# Patient Record
Sex: Male | Born: 1945 | Race: Black or African American | Hispanic: No | Smoking: Former smoker
Health system: Southern US, Community
[De-identification: ages and names within clinical notes are randomized; demographics above are authoritative.]

## PROBLEM LIST (undated history)

## (undated) ENCOUNTER — Ambulatory Visit: Payer: Self-pay

## (undated) ENCOUNTER — Emergency Department (HOSPITAL_COMMUNITY): Admission: EM | Discharge status: Absent without leave | Payer: Medicare Other

## (undated) DIAGNOSIS — I739 Peripheral vascular disease, unspecified: Secondary | ICD-10-CM

## (undated) DIAGNOSIS — H5462 Unqualified visual loss, left eye, normal vision right eye: Secondary | ICD-10-CM

## (undated) DIAGNOSIS — E785 Hyperlipidemia, unspecified: Secondary | ICD-10-CM

## (undated) DIAGNOSIS — W3400XA Accidental discharge from unspecified firearms or gun, initial encounter: Secondary | ICD-10-CM

## (undated) DIAGNOSIS — R809 Proteinuria, unspecified: Secondary | ICD-10-CM

## (undated) DIAGNOSIS — G629 Polyneuropathy, unspecified: Secondary | ICD-10-CM

## (undated) DIAGNOSIS — I639 Cerebral infarction, unspecified: Secondary | ICD-10-CM

## (undated) DIAGNOSIS — I10 Essential (primary) hypertension: Secondary | ICD-10-CM

## (undated) DIAGNOSIS — Z720 Tobacco use: Secondary | ICD-10-CM

## (undated) DIAGNOSIS — N183 Chronic kidney disease, stage 3 unspecified: Secondary | ICD-10-CM

## (undated) DIAGNOSIS — E119 Type 2 diabetes mellitus without complications: Secondary | ICD-10-CM

## (undated) HISTORY — PX: LAPAROTOMY: SHX154

## (undated) HISTORY — DX: Proteinuria, unspecified: R80.9

## (undated) HISTORY — DX: Polyneuropathy, unspecified: G62.9

## (undated) HISTORY — DX: Hyperlipidemia, unspecified: E78.5

## (undated) HISTORY — DX: Cerebral infarction, unspecified: I63.9

## (undated) HISTORY — DX: Essential (primary) hypertension: I10

---

## 1996-07-15 ENCOUNTER — Encounter: Payer: Self-pay | Admitting: Internal Medicine

## 1997-03-05 DIAGNOSIS — I639 Cerebral infarction, unspecified: Secondary | ICD-10-CM

## 1997-03-05 HISTORY — DX: Cerebral infarction, unspecified: I63.9

## 2004-01-25 ENCOUNTER — Ambulatory Visit: Payer: Self-pay | Admitting: Internal Medicine

## 2004-03-13 ENCOUNTER — Ambulatory Visit: Payer: Self-pay | Admitting: Internal Medicine

## 2004-05-22 ENCOUNTER — Ambulatory Visit: Payer: Self-pay | Admitting: Internal Medicine

## 2004-06-27 ENCOUNTER — Ambulatory Visit: Payer: Self-pay | Admitting: Internal Medicine

## 2004-06-29 ENCOUNTER — Ambulatory Visit: Payer: Self-pay | Admitting: Cardiology

## 2004-07-04 ENCOUNTER — Ambulatory Visit: Payer: Self-pay | Admitting: Internal Medicine

## 2004-07-05 ENCOUNTER — Encounter: Payer: Self-pay | Admitting: Internal Medicine

## 2004-08-23 ENCOUNTER — Ambulatory Visit: Payer: Self-pay | Admitting: Internal Medicine

## 2004-09-01 ENCOUNTER — Ambulatory Visit: Payer: Self-pay | Admitting: Internal Medicine

## 2004-11-18 ENCOUNTER — Emergency Department (HOSPITAL_COMMUNITY): Admission: EM | Admit: 2004-11-18 | Discharge: 2004-11-18 | Payer: Self-pay | Admitting: Emergency Medicine

## 2004-11-23 ENCOUNTER — Ambulatory Visit: Payer: Self-pay | Admitting: Internal Medicine

## 2005-04-10 ENCOUNTER — Ambulatory Visit: Payer: Self-pay | Admitting: Internal Medicine

## 2005-04-17 ENCOUNTER — Ambulatory Visit: Payer: Self-pay | Admitting: Internal Medicine

## 2005-05-30 ENCOUNTER — Emergency Department (HOSPITAL_COMMUNITY): Admission: EM | Admit: 2005-05-30 | Discharge: 2005-05-30 | Payer: Self-pay | Admitting: Emergency Medicine

## 2005-07-17 ENCOUNTER — Ambulatory Visit: Payer: Self-pay | Admitting: Internal Medicine

## 2005-12-14 ENCOUNTER — Encounter: Payer: Self-pay | Admitting: Internal Medicine

## 2005-12-14 ENCOUNTER — Ambulatory Visit: Payer: Self-pay | Admitting: Internal Medicine

## 2005-12-14 DIAGNOSIS — I10 Essential (primary) hypertension: Secondary | ICD-10-CM | POA: Insufficient documentation

## 2005-12-14 DIAGNOSIS — F17200 Nicotine dependence, unspecified, uncomplicated: Secondary | ICD-10-CM | POA: Insufficient documentation

## 2005-12-14 DIAGNOSIS — E1169 Type 2 diabetes mellitus with other specified complication: Secondary | ICD-10-CM | POA: Insufficient documentation

## 2005-12-14 DIAGNOSIS — N183 Chronic kidney disease, stage 3 (moderate): Secondary | ICD-10-CM

## 2005-12-14 DIAGNOSIS — Z794 Long term (current) use of insulin: Secondary | ICD-10-CM

## 2005-12-14 DIAGNOSIS — E785 Hyperlipidemia, unspecified: Secondary | ICD-10-CM | POA: Insufficient documentation

## 2005-12-14 DIAGNOSIS — E1122 Type 2 diabetes mellitus with diabetic chronic kidney disease: Secondary | ICD-10-CM | POA: Insufficient documentation

## 2005-12-14 LAB — CONVERTED CEMR LAB
ALT: 10 units/L (ref 0–40)
AST: 14 units/L (ref 0–37)
Albumin: 3.5 g/dL (ref 3.5–5.2)
Alkaline Phosphatase: 61 units/L (ref 39–117)
BUN: 13 mg/dL (ref 6–23)
Bilirubin, Direct: 0.1 mg/dL (ref 0.0–0.3)
CO2: 28 meq/L (ref 19–32)
Calcium: 9.5 mg/dL (ref 8.4–10.5)
Chloride: 98 meq/L (ref 96–112)
Chol/HDL Ratio, serum: 6.3
Cholesterol: 186 mg/dL (ref 0–200)
Creatinine, Ser: 1.4 mg/dL (ref 0.4–1.5)
GFR calc non Af Amer: 55 mL/min
Glomerular Filtration Rate, Af Am: 67 mL/min/{1.73_m2}
Glucose, Bld: 337 mg/dL — ABNORMAL HIGH (ref 70–99)
HDL: 29.4 mg/dL — ABNORMAL LOW (ref >39.0)
Hgb A1c MFr Bld: 9.7 % — ABNORMAL HIGH (ref 4.6–6.0)
LDL Cholesterol: 128 mg/dL — ABNORMAL HIGH (ref 0–99)
Potassium: 3.9 meq/L (ref 3.5–5.1)
Sodium: 135 meq/L (ref 135–145)
Total Bilirubin: 1.1 mg/dL (ref 0.3–1.2)
Total Protein: 6.5 g/dL (ref 6.0–8.3)
Triglyceride fasting, serum: 144 mg/dL (ref 0–149)
VLDL: 29 mg/dL (ref 0–40)

## 2005-12-14 LAB — HM DIABETES FOOT EXAM

## 2006-01-15 ENCOUNTER — Ambulatory Visit: Payer: Self-pay | Admitting: Internal Medicine

## 2006-03-29 ENCOUNTER — Ambulatory Visit: Payer: Self-pay | Admitting: Internal Medicine

## 2006-06-06 ENCOUNTER — Ambulatory Visit: Payer: Self-pay | Admitting: Internal Medicine

## 2006-06-06 LAB — CONVERTED CEMR LAB
ALT: 13 units/L (ref 0–40)
AST: 12 units/L (ref 0–37)
Albumin: 4.1 g/dL (ref 3.5–5.2)
Alkaline Phosphatase: 69 units/L (ref 39–117)
BUN: 12 mg/dL (ref 6–23)
Bilirubin, Direct: 0.1 mg/dL (ref 0.0–0.3)
CO2: 31 meq/L (ref 19–32)
Calcium: 9.7 mg/dL (ref 8.4–10.5)
Chloride: 101 meq/L (ref 96–112)
Cholesterol: 137 mg/dL (ref 0–200)
Creatinine, Ser: 0.9 mg/dL (ref 0.4–1.5)
GFR calc Af Amer: 111 mL/min
GFR calc non Af Amer: 92 mL/min
Glucose, Bld: 183 mg/dL — ABNORMAL HIGH (ref 70–99)
HDL: 38.9 mg/dL — ABNORMAL LOW (ref >39.0)
Hgb A1c MFr Bld: 9.6 % — ABNORMAL HIGH (ref 4.6–6.0)
LDL Cholesterol: 83 mg/dL (ref 0–99)
Potassium: 3.5 meq/L (ref 3.5–5.1)
Sodium: 140 meq/L (ref 135–145)
Total Bilirubin: 0.9 mg/dL (ref 0.3–1.2)
Total CHOL/HDL Ratio: 3.5
Total Protein: 7.5 g/dL (ref 6.0–8.3)
Triglycerides: 78 mg/dL (ref 0–149)
VLDL: 16 mg/dL (ref 0–40)

## 2006-09-10 ENCOUNTER — Ambulatory Visit: Payer: Self-pay | Admitting: Internal Medicine

## 2006-09-10 DIAGNOSIS — F528 Other sexual dysfunction not due to a substance or known physiological condition: Secondary | ICD-10-CM | POA: Insufficient documentation

## 2006-09-10 LAB — CONVERTED CEMR LAB
ALT: 12 units/L (ref 0–53)
AST: 15 units/L (ref 0–37)
Albumin: 3.7 g/dL (ref 3.5–5.2)
Alkaline Phosphatase: 64 units/L (ref 39–117)
BUN: 11 mg/dL (ref 6–23)
Bilirubin, Direct: 0.1 mg/dL (ref 0.0–0.3)
CO2: 27 meq/L (ref 19–32)
Calcium: 9.4 mg/dL (ref 8.4–10.5)
Chloride: 108 meq/L (ref 96–112)
Cholesterol, target level: 200 mg/dL
Cholesterol: 160 mg/dL (ref 0–200)
Creatinine, Ser: 1 mg/dL (ref 0.4–1.5)
GFR calc Af Amer: 98 mL/min
GFR calc non Af Amer: 81 mL/min
Glucose, Bld: 153 mg/dL — ABNORMAL HIGH (ref 70–99)
HDL goal, serum: 40 mg/dL
HDL: 30 mg/dL — ABNORMAL LOW (ref >39.0)
Hgb A1c MFr Bld: 9.1 % — ABNORMAL HIGH (ref 4.6–6.0)
LDL Cholesterol: 108 mg/dL — ABNORMAL HIGH (ref 0–99)
LDL Goal: 100 mg/dL
Potassium: 4.4 meq/L (ref 3.5–5.1)
Sodium: 142 meq/L (ref 135–145)
Total Bilirubin: 1.4 mg/dL — ABNORMAL HIGH (ref 0.3–1.2)
Total CHOL/HDL Ratio: 5.3
Total Protein: 6.8 g/dL (ref 6.0–8.3)
Triglycerides: 112 mg/dL (ref 0–149)
VLDL: 22 mg/dL (ref 0–40)

## 2006-10-15 DIAGNOSIS — I69959 Hemiplegia and hemiparesis following unspecified cerebrovascular disease affecting unspecified side: Secondary | ICD-10-CM | POA: Insufficient documentation

## 2006-11-08 ENCOUNTER — Ambulatory Visit: Payer: Self-pay | Admitting: Internal Medicine

## 2007-02-14 ENCOUNTER — Ambulatory Visit: Payer: Self-pay | Admitting: Internal Medicine

## 2007-02-14 LAB — CONVERTED CEMR LAB
ALT: 21 units/L (ref 0–53)
AST: 18 units/L (ref 0–37)
Albumin: 3.7 g/dL (ref 3.5–5.2)
Alkaline Phosphatase: 62 units/L (ref 39–117)
BUN: 11 mg/dL (ref 6–23)
Bilirubin, Direct: 0.3 mg/dL (ref 0.0–0.3)
CO2: 32 meq/L (ref 19–32)
Calcium: 9.5 mg/dL (ref 8.4–10.5)
Chloride: 102 meq/L (ref 96–112)
Cholesterol: 128 mg/dL (ref 0–200)
Creatinine, Ser: 1 mg/dL (ref 0.4–1.5)
GFR calc Af Amer: 98 mL/min
GFR calc non Af Amer: 81 mL/min
Glucose, Bld: 156 mg/dL — ABNORMAL HIGH (ref 70–99)
HDL: 28.2 mg/dL — ABNORMAL LOW (ref >39.0)
Hgb A1c MFr Bld: 7.7 % — ABNORMAL HIGH (ref 4.6–6.0)
LDL Cholesterol: 86 mg/dL (ref 0–99)
Potassium: 3.9 meq/L (ref 3.5–5.1)
Sodium: 141 meq/L (ref 135–145)
Total Bilirubin: 1.3 mg/dL — ABNORMAL HIGH (ref 0.3–1.2)
Total CHOL/HDL Ratio: 4.5
Total Protein: 6.7 g/dL (ref 6.0–8.3)
Triglycerides: 69 mg/dL (ref 0–149)
VLDL: 14 mg/dL (ref 0–40)

## 2007-02-26 ENCOUNTER — Ambulatory Visit: Payer: Self-pay | Admitting: Internal Medicine

## 2007-02-26 LAB — CONVERTED CEMR LAB: LDL Goal: 70 mg/dL

## 2007-02-26 LAB — HM COLONOSCOPY

## 2007-11-21 ENCOUNTER — Encounter (INDEPENDENT_AMBULATORY_CARE_PROVIDER_SITE_OTHER): Payer: Self-pay | Admitting: *Deleted

## 2007-11-21 ENCOUNTER — Ambulatory Visit: Payer: Self-pay | Admitting: Internal Medicine

## 2007-11-26 LAB — CONVERTED CEMR LAB
ALT: 32 units/L (ref 0–53)
AST: 23 units/L (ref 0–37)
BUN: 13 mg/dL (ref 6–23)
CO2: 30 meq/L (ref 19–32)
Calcium: 9 mg/dL (ref 8.4–10.5)
Chloride: 104 meq/L (ref 96–112)
Cholesterol: 108 mg/dL (ref 0–200)
Creatinine, Ser: 1 mg/dL (ref 0.4–1.5)
GFR calc Af Amer: 98 mL/min
GFR calc non Af Amer: 81 mL/min
Glucose, Bld: 214 mg/dL — ABNORMAL HIGH (ref 70–99)
HDL: 31 mg/dL — ABNORMAL LOW (ref >39.0)
Hgb A1c MFr Bld: 9.9 % — ABNORMAL HIGH (ref 4.6–6.0)
LDL Cholesterol: 65 mg/dL (ref 0–99)
Potassium: 3.9 meq/L (ref 3.5–5.1)
Sodium: 139 meq/L (ref 135–145)
Total CHOL/HDL Ratio: 3.5
Triglycerides: 61 mg/dL (ref 0–149)
VLDL: 12 mg/dL (ref 0–40)

## 2007-12-16 ENCOUNTER — Ambulatory Visit: Payer: Self-pay | Admitting: Internal Medicine

## 2007-12-25 ENCOUNTER — Ambulatory Visit: Payer: Self-pay | Admitting: Internal Medicine

## 2008-08-03 LAB — HM DIABETES EYE EXAM: HM Diabetic Eye Exam: NORMAL

## 2008-09-07 ENCOUNTER — Ambulatory Visit: Payer: Self-pay | Admitting: Internal Medicine

## 2008-09-07 DIAGNOSIS — K5900 Constipation, unspecified: Secondary | ICD-10-CM | POA: Insufficient documentation

## 2008-09-07 DIAGNOSIS — Z8679 Personal history of other diseases of the circulatory system: Secondary | ICD-10-CM | POA: Insufficient documentation

## 2008-09-09 LAB — CONVERTED CEMR LAB
ALT: 18 units/L (ref 0–53)
AST: 17 units/L (ref 0–37)
Albumin: 3.5 g/dL (ref 3.5–5.2)
Alkaline Phosphatase: 73 units/L (ref 39–117)
BUN: 11 mg/dL (ref 6–23)
Bilirubin, Direct: 0 mg/dL (ref 0.0–0.3)
CO2: 32 meq/L (ref 19–32)
Calcium: 8.9 mg/dL (ref 8.4–10.5)
Chloride: 99 meq/L (ref 96–112)
Cholesterol: 112 mg/dL (ref 0–200)
Creatinine, Ser: 1 mg/dL (ref 0.4–1.5)
GFR calc non Af Amer: 97.43 mL/min (ref >60)
Glucose, Bld: 281 mg/dL — ABNORMAL HIGH (ref 70–99)
HDL: 13.9 mg/dL — ABNORMAL LOW (ref >39.00)
LDL Cholesterol: 66 mg/dL (ref 0–99)
Potassium: 4.1 meq/L (ref 3.5–5.1)
Sodium: 138 meq/L (ref 135–145)
TSH: 1.07 microintl units/mL (ref 0.35–5.50)
Total Bilirubin: 1.5 mg/dL — ABNORMAL HIGH (ref 0.3–1.2)
Total CHOL/HDL Ratio: 8
Total Protein: 7.6 g/dL (ref 6.0–8.3)
Triglycerides: 161 mg/dL — ABNORMAL HIGH (ref 0.0–149.0)
VLDL: 32.2 mg/dL (ref 0.0–40.0)

## 2008-09-10 ENCOUNTER — Ambulatory Visit: Payer: Self-pay | Admitting: Internal Medicine

## 2008-09-13 ENCOUNTER — Ambulatory Visit: Payer: Self-pay | Admitting: Internal Medicine

## 2008-09-13 LAB — CONVERTED CEMR LAB: Hgb A1c MFr Bld: 8.9 % — ABNORMAL HIGH (ref 4.6–6.5)

## 2008-09-16 ENCOUNTER — Telehealth: Payer: Self-pay | Admitting: Internal Medicine

## 2008-10-01 ENCOUNTER — Ambulatory Visit: Payer: Self-pay | Admitting: Internal Medicine

## 2008-10-06 ENCOUNTER — Encounter: Admission: RE | Admit: 2008-10-06 | Discharge: 2008-10-06 | Payer: Self-pay | Admitting: Internal Medicine

## 2008-10-11 ENCOUNTER — Ambulatory Visit: Payer: Self-pay | Admitting: Internal Medicine

## 2008-10-11 DIAGNOSIS — G4710 Hypersomnia, unspecified: Secondary | ICD-10-CM | POA: Insufficient documentation

## 2008-10-11 DIAGNOSIS — R42 Dizziness and giddiness: Secondary | ICD-10-CM | POA: Insufficient documentation

## 2008-10-28 ENCOUNTER — Telehealth: Payer: Self-pay | Admitting: Internal Medicine

## 2008-10-28 ENCOUNTER — Encounter (INDEPENDENT_AMBULATORY_CARE_PROVIDER_SITE_OTHER): Payer: Self-pay | Admitting: *Deleted

## 2008-11-03 ENCOUNTER — Encounter: Payer: Self-pay | Admitting: Internal Medicine

## 2009-02-14 ENCOUNTER — Encounter: Payer: Self-pay | Admitting: Internal Medicine

## 2009-03-11 ENCOUNTER — Encounter: Payer: Self-pay | Admitting: Internal Medicine

## 2009-09-14 ENCOUNTER — Ambulatory Visit: Payer: Self-pay | Admitting: Internal Medicine

## 2009-09-14 DIAGNOSIS — R5381 Other malaise: Secondary | ICD-10-CM | POA: Insufficient documentation

## 2009-09-14 DIAGNOSIS — R5383 Other fatigue: Secondary | ICD-10-CM

## 2009-09-19 LAB — CONVERTED CEMR LAB
ALT: 13 units/L (ref 0–53)
AST: 15 units/L (ref 0–37)
Albumin: 3.8 g/dL (ref 3.5–5.2)
Alkaline Phosphatase: 52 units/L (ref 39–117)
BUN: 13 mg/dL (ref 6–23)
Basophils Absolute: 0 10*3/uL (ref 0.0–0.1)
Basophils Relative: 1 % (ref 0.0–3.0)
Bilirubin, Direct: 0.2 mg/dL (ref 0.0–0.3)
CO2: 29 meq/L (ref 19–32)
Calcium: 8.7 mg/dL (ref 8.4–10.5)
Chloride: 105 meq/L (ref 96–112)
Creatinine, Ser: 1 mg/dL (ref 0.4–1.5)
Eosinophils Absolute: 0.1 10*3/uL (ref 0.0–0.7)
Eosinophils Relative: 2.7 % (ref 0.0–5.0)
GFR calc non Af Amer: 93.85 mL/min (ref >60)
Glucose, Bld: 274 mg/dL — ABNORMAL HIGH (ref 70–99)
HCT: 35.7 % — ABNORMAL LOW (ref 39.0–52.0)
Hemoglobin: 12.2 g/dL — ABNORMAL LOW (ref 13.0–17.0)
Hgb A1c MFr Bld: 8.3 % — ABNORMAL HIGH (ref 4.6–6.5)
Lymphocytes Relative: 39.7 % (ref 12.0–46.0)
Lymphs Abs: 1.8 10*3/uL (ref 0.7–4.0)
MCHC: 34.1 g/dL (ref 30.0–36.0)
MCV: 97 fL (ref 78.0–100.0)
Monocytes Absolute: 0.5 10*3/uL (ref 0.1–1.0)
Monocytes Relative: 11.5 % (ref 3.0–12.0)
Neutro Abs: 2.1 10*3/uL (ref 1.4–7.7)
Neutrophils Relative %: 45.1 % (ref 43.0–77.0)
Platelets: 257 10*3/uL (ref 150.0–400.0)
Potassium: 4.7 meq/L (ref 3.5–5.1)
RBC: 3.68 M/uL — ABNORMAL LOW (ref 4.22–5.81)
RDW: 14.2 % (ref 11.5–14.6)
Sed Rate: 32 mm/hr — ABNORMAL HIGH (ref 0–22)
Sodium: 141 meq/L (ref 135–145)
TSH: 1.16 microintl units/mL (ref 0.35–5.50)
Total Bilirubin: 1 mg/dL (ref 0.3–1.2)
Total Protein: 7 g/dL (ref 6.0–8.3)
WBC: 4.6 10*3/uL (ref 4.5–10.5)

## 2009-09-23 ENCOUNTER — Ambulatory Visit: Payer: Self-pay | Admitting: Internal Medicine

## 2010-04-06 NOTE — Assessment & Plan Note (Signed)
Summary: med check/follow up/cjr   Vital Signs:  Patient profile:   65 year old male Weight:      172 pounds BMI:     22.77 Temp:     98.4 degrees F oral Pulse rate:   80 / minute Pulse rhythm:   regular Resp:     12 per minute BP sitting:   140 / 82  (left arm) Cuff size:   regular  Vitals Entered By: Rica Records, RN (September 14, 2009 11:25 AM) CC: medication review Is Patient Diabetic? Yes Did you bring your meter with you today? No Comments does not check CBGs at home   CC:  medication review.  History of Present Illness: pt comes in and main complaint is that he needs to get on disability---based on need for eye surgery. He has no insurance. cannot see eye surgery. (records at [facility])---he will get those records for me  htn---says metoprolol makes his bp too low---wife states he is still taking it  Preventive Screening-Counseling & Management  Alcohol-Tobacco     Smoking Status: current     Smoking Cessation Counseling: yes     Packs/Day: 1.0  Current Medications (verified): 1)  Aspirin 81 Mg Tbec (Aspirin) .[name] Kitchen.. 1 Once Daily 2)  Lisinopril-Hydrochlorothiazide 20-25 Mg Tabs (Lisinopril-Hydrochlorothiazide) .[name] Kitchen.. 1 By Mouth Qd 3)  Metformin Hcl 1000 Mg Tabs (Metformin Hcl) .... Take 1 Tablet By Mouth Two Times A Day 4)  Simvastatin 40 Mg Tabs (Simvastatin) .[name] Kitchen.. 1 By Mouth Qd 5)  Glyburide 5 Mg  Tabs (Glyburide) .... Take 1 Tablet By Mouth Two Times A Day 6)  Docusate Sodium 100 Mg Caps (Docusate Sodium) .... Once Daily As Needed 7)  Metoprolol Tartrate 50 Mg Tabs (Metoprolol Tartrate) .... Two Times A Day 8)  Miralax   Powd (Polyethylene Glycol 3350) .[name] Kitchen.. 17g By Mouth Once Daily As Needed Constipation 9)  Multivitamins  Caps (Multiple Vitamin) .[name] Kitchen.. 1 Once Daily--Takes At Times  Allergies (verified): No Known Drug Allergies  Social History: Packs/Day:  1.0  Physical Exam  General:  alert and well-developed.   Eyes:  pupils equal and pupils round.     Ears:  R ear normal and L ear normal.   Neck:  No deformities, masses, or tenderness noted. Chest Wall:  no deformities and no tenderness.   Lungs:  normal respiratory effort and no intercostal retractions.   Heart:  normal rate and regular rhythm.   Abdomen:  soft and non-tender.   Msk:  No deformity or scoliosis noted of thoracic or lumbar spine.   Neurologic:  cranial nerves II-XII intact and gait normal.     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 1 once daily    Lisinopril-hydrochlorothiazide 20-25 Mg Tabs (Lisinopril-hydrochlorothiazide) .[name] Kitchen... 1 by mouth qd    Metformin Hcl 1000 Mg Tabs (Metformin hcl) .[name] Kitchen... Take 1 tablet by mouth two times a day    Glyburide 5 Mg Tabs (Glyburide) .[name] Kitchen... 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 1 once daily  Problem # 3:  HYPERTENSION (ICD-401.9)  he thinks bp is too low will decrease metoprolol and he will monitor bp goal bp < 130/80 His updated medication list for this problem includes:    Lisinopril-hydrochlorothiazide 20-25 Mg Tabs (Lisinopril-hydrochlorothiazide) .[name] Kitchen... 1 by mouth qd    Metoprolol Tartrate 50 Mg Tabs (Metoprolol tartrate) .[name] Kitchen... 1/2 by mouth once daily  BP today: 140/82 Prior BP: 138/90 (10/11/2008)  Prior 10 Yr Risk Heart Disease: 40 % (02/26/2007)  Labs Reviewed: K+: 4.1 (09/07/2008) Creat: : 1.0  (09/07/2008)   Chol: 112 (09/07/2008)   HDL: 13.90 (09/07/2008)   LDL: 66 (09/07/2008)   TG: 161.0 (09/07/2008)  Orders: Venipuncture [id number])  Problem # 4:  HYPERLIPIDEMIA (ICD-272.4) Assessment: Unchanged  His updated medication list for this problem includes:    Simvastatin 40 Mg Tabs (Simvastatin) .[name] Kitchen... 1 by mouth qd  Labs Reviewed: SGOT: 17 (09/07/2008)   SGPT: 18 (09/07/2008)  Lipid Goals: Chol Goal: 200 (09/10/2006)   HDL Goal: 40 (09/10/2006)   LDL Goal: 70 (02/26/2007)   TG Goal: 150 (09/10/2006)  Prior 10 Yr Risk Heart Disease: 40 % (02/26/2007)   HDL:13.90 (09/07/2008), 31.0 (11/21/2007)  LDL:66 (09/07/2008), 65 (11/21/2007)  Chol:112 (09/07/2008), 108 (11/21/2007)  Trig:161.0 (09/07/2008), 61 (11/21/2007)  Complete Medication List: 1)  Aspirin 81 Mg Tbec (Aspirin) .[name] Kitchen.. 1 once daily 2)  Lisinopril-hydrochlorothiazide 20-25 Mg Tabs (Lisinopril-hydrochlorothiazide) .[name] Kitchen.. 1 by mouth qd 3)  Metformin Hcl 1000 Mg Tabs (Metformin hcl) .... Take 1 tablet by mouth two times a day 4)  Simvastatin 40 Mg Tabs (Simvastatin) .[name] Kitchen.. 1 by mouth qd 5)  Glyburide 5 Mg Tabs (Glyburide) .... Take 1 tablet by mouth two times a day 6)  Docusate Sodium 100 Mg Caps (Docusate sodium) .... Once daily as needed 7)  Metoprolol Tartrate 50 Mg Tabs (Metoprolol tartrate) .... 1/2 by mouth once daily 8)  Miralax Powd (Polyethylene glycol 3350) .[name] Kitchen.. 17g by mouth once daily as needed constipation 9)  Multivitamins Caps (Multiple vitamin) .[name] Kitchen.. 1 once daily--takes at times  Other Orders: TLB-BMP (Basic Metabolic Panel-BMET) (99991111) TLB-Hepatic/Liver Function Pnl (80076-HEPATIC) TLB-CBC Platelet - w/Differential (85025-CBCD) TLB-TSH (Thyroid Stimulating Hormone) (84443-TSH) TLB-Sedimentation Rate (ESR) (85652-ESR)  Patient Instructions: 1)  medical release for information from [facility]

## 2010-04-06 NOTE — Assessment & Plan Note (Signed)
Summary: discuss insulin/et   Vital Signs:  Patient profile:   65 year old male Weight:      170 pounds BMI:     22.51 Pulse rate:   80 / minute Pulse rhythm:   regular Resp:     12 per minute BP sitting:   162 / 90  (left arm) Cuff size:   regular  Vitals Entered By: Rica Records, RN (September 23, 2009 9:34 AM) CC: discuss insulin--does not check CBGs at home Is Patient Diabetic? Yes Did you bring your meter with you today? No   CC:  discuss insulin--does not check CBGs at home.  Preventive Screening-Counseling & Management  Alcohol-Tobacco     Smoking Status: current     Smoking Cessation Counseling: yes     Packs/Day: 1.0  Current Medications (verified): 1)  Aspirin 81 Mg Tbec (Aspirin) .[name] Kitchen.. 1 Once Daily 2)  Lisinopril-Hydrochlorothiazide 20-25 Mg Tabs (Lisinopril-Hydrochlorothiazide) .[name] Kitchen.. 1 By Mouth Qd 3)  Metformin Hcl 1000 Mg Tabs (Metformin Hcl) .... Take 1 Tablet By Mouth Two Times A Day 4)  Simvastatin 40 Mg Tabs (Simvastatin) .[name] Kitchen.. 1 By Mouth Qd 5)  Glyburide 5 Mg  Tabs (Glyburide) .... Take 1 Tablet By Mouth Two Times A Day 6)  Docusate Sodium 100 Mg Caps (Docusate Sodium) .... Once Daily As Needed 7)  Metoprolol Tartrate 50 Mg Tabs (Metoprolol Tartrate) .... 1/2 By Mouth Twice Daily 8)  Miralax   Powd (Polyethylene Glycol 3350) .[name] Kitchen.. 17g By Mouth Once Daily As Needed Constipation 9)  Multivitamins  Caps (Multiple Vitamin) .[name] Kitchen.. 1 Once Daily--Takes At Times  Allergies (verified): No Known Drug Allergies   Impression & Recommendations:  Problem # 1:  DIABETES MELLITUS, TYPE II (ICD-250.00)  needs insulin discussed with patient and family at length pt is agreeble d/c glyburide see meds side effects discussed total time 20 minutes>1/2 FTF counselling  The following medications were removed from the medication list:    Glyburide 5 Mg Tabs (Glyburide) .[name] Kitchen... Take 1 tablet by mouth two times a day His updated medication list for this problem includes:    Aspirin  81 Mg Tbec (Aspirin) .[name] Kitchen... 1 once daily    Lisinopril-hydrochlorothiazide 20-25 Mg Tabs (Lisinopril-hydrochlorothiazide) .[name] Kitchen... 1 by mouth qd    Metformin Hcl 1000 Mg Tabs (Metformin hcl) .[name] Kitchen... Take 1 tablet by mouth two times a day    Novolin 70/30 70-30 % Susp (Insulin isophane & regular) .[name] KitchenMarland KitchenMarland KitchenMarland Kitchen 10 units with breakfast and dinner insulin syringes #100  Labs Reviewed: Creat: 1.0 (09/14/2009)     Last Eye Exam: normal-pt's report (08/03/2008) Reviewed HgBA1c results: 8.3 (09/14/2009)  8.9 (09/10/2008)  Complete Medication List: 1)  Aspirin 81 Mg Tbec (Aspirin) .[name] Kitchen.. 1 once daily 2)  Lisinopril-hydrochlorothiazide 20-25 Mg Tabs (Lisinopril-hydrochlorothiazide) .[name] Kitchen.. 1 by mouth qd 3)  Metformin Hcl 1000 Mg Tabs (Metformin hcl) .... Take 1 tablet by mouth two times a day 4)  Simvastatin 40 Mg Tabs (Simvastatin) .[name] Kitchen.. 1 by mouth qd 5)  Docusate Sodium 100 Mg Caps (Docusate sodium) .... Once daily as needed 6)  Metoprolol Tartrate 50 Mg Tabs (Metoprolol tartrate) .... 1/2 by mouth twice daily 7)  Miralax Powd (Polyethylene glycol 3350) .[name] Kitchen.. 17g by mouth once daily as needed constipation 8)  Multivitamins Caps (Multiple vitamin) .[name] Kitchen.. 1 once daily--takes at times 9)  Novolin 70/30 70-30 % Susp (Insulin isophane & regular) .[name] Kitchen.. 10 units with breakfast and dinner insulin syringes #100  Patient Instructions: 1)  . Prescriptions: NOVOLIN 70/30 70-30 % SUSP (INSULIN ISOPHANE &  REGULAR) 10 units with breakfast and dinner insulin syringes #100  #3 vials x 3   Entered and Authorized by:   Phoebe Sharps MD   Signed by:   Phoebe Sharps MD on 09/23/2009   Method used:   Electronically to        [facility] (retail)       [address]       [place], [place]  [address]       Ph: [phone] or [phone]       Fax: [phone]   RxID:   [id number]

## 2010-04-06 NOTE — Letter (Signed)
Summary: [facility]   Imported By: Laural Benes 09/23/2009 13:13:46  _____________________________________________________________________  External Attachment:    Type:   Image     Comment:   External Document

## 2010-04-06 NOTE — Letter (Signed)
Summary: Records from Disability Determination Services  Records from [facility]ability Determination Services   Imported By: Laural Benes 04/08/2009 11:03:44  _____________________________________________________________________  External Attachment:    Type:   Image     Comment:   External Document

## 2010-05-04 ENCOUNTER — Telehealth: Payer: Self-pay | Admitting: Internal Medicine

## 2010-05-04 NOTE — Telephone Encounter (Signed)
Pt called to speak with Nurse in ref to obtaining a letter for [employer] stating that the pt is eligible and med cleared for same.... Pt can be reached at [phone].

## 2010-05-08 NOTE — Telephone Encounter (Signed)
Pt needs an OV for surgical clearance

## 2010-05-08 NOTE — Telephone Encounter (Signed)
APPT SCHEDULED FOR June 07, 2010  WITH DR SWORDS FOR SURG CLEARANCE // RS

## 2010-06-06 ENCOUNTER — Encounter: Payer: Self-pay | Admitting: Internal Medicine

## 2010-06-07 ENCOUNTER — Encounter: Payer: Self-pay | Admitting: Internal Medicine

## 2010-06-07 ENCOUNTER — Ambulatory Visit (INDEPENDENT_AMBULATORY_CARE_PROVIDER_SITE_OTHER): Payer: Self-pay | Admitting: Internal Medicine

## 2010-06-07 DIAGNOSIS — E119 Type 2 diabetes mellitus without complications: Secondary | ICD-10-CM

## 2010-06-07 DIAGNOSIS — I10 Essential (primary) hypertension: Secondary | ICD-10-CM

## 2010-06-07 DIAGNOSIS — H269 Unspecified cataract: Secondary | ICD-10-CM

## 2010-06-07 DIAGNOSIS — E785 Hyperlipidemia, unspecified: Secondary | ICD-10-CM

## 2010-06-07 DIAGNOSIS — F17200 Nicotine dependence, unspecified, uncomplicated: Secondary | ICD-10-CM

## 2010-06-07 LAB — HEPATIC FUNCTION PANEL
ALT: 15 U/L (ref 0–53)
AST: 15 U/L (ref 0–37)
Albumin: 3.7 g/dL (ref 3.5–5.2)
Alkaline Phosphatase: 58 U/L (ref 39–117)
Bilirubin, Direct: 0.1 mg/dL (ref 0.0–0.3)
Total Bilirubin: 1.5 mg/dL — ABNORMAL HIGH (ref 0.3–1.2)
Total Protein: 6.8 g/dL (ref 6.0–8.3)

## 2010-06-07 LAB — BASIC METABOLIC PANEL
BUN: 21 mg/dL (ref 6–23)
CO2: 30 mEq/L (ref 19–32)
Calcium: 9.1 mg/dL (ref 8.4–10.5)
Chloride: 105 mEq/L (ref 96–112)
Creatinine, Ser: 1.2 mg/dL (ref 0.4–1.5)
GFR: 81.63 mL/min (ref >60.00)
Glucose, Bld: 150 mg/dL — ABNORMAL HIGH (ref 70–99)
Potassium: 4.2 mEq/L (ref 3.5–5.1)
Sodium: 142 mEq/L (ref 135–145)

## 2010-06-07 LAB — LIPID PANEL
Cholesterol: 152 mg/dL (ref 0–200)
HDL: 37.9 mg/dL — ABNORMAL LOW (ref >39.00)
LDL Cholesterol: 96 mg/dL (ref 0–99)
Total CHOL/HDL Ratio: 4
Triglycerides: 89 mg/dL (ref 0.0–149.0)
VLDL: 17.8 mg/dL (ref 0.0–40.0)

## 2010-06-07 LAB — HEMOGLOBIN A1C: Hgb A1c MFr Bld: 10.6 % — ABNORMAL HIGH (ref 4.6–6.5)
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
## 2010-06-12 ENCOUNTER — Encounter: Payer: Self-pay | Admitting: Internal Medicine

## 2010-06-12 NOTE — Assessment & Plan Note (Signed)
The patient is noncompliant with medications, diet, exercise plans. I've encouraged the patient to be compliant. I've encouraged him to follow an exercise plan. He should follow a low calorie, low carbohydrate diet. Continue medications. He should monitor his blood sugar. He needs to followup with ophthalmologist regarding potential macular degeneration. He also tells me that he has glaucoma and needs a referral for evaluation at [facility].

## 2010-06-12 NOTE — Assessment & Plan Note (Signed)
Discussed with the patient and his family members. He needs to quit smoking. Tobacco abuse puts him at high risk of recurrent stroke, myocardial infarction and death. He voices understanding but has no plans on quitting.

## 2010-06-12 NOTE — Assessment & Plan Note (Signed)
Continue current medications. We'll check labs today.

## 2010-06-12 NOTE — Assessment & Plan Note (Signed)
Patient's blood pressure is not controlled today. It's hard to get a read on his blood pressure as he has been noncompliant with medications. There is a chance that the lisinopril is causing a cough. He states the cough is minimal. He is best that he resume medications as instructed. I want to follow up with him in 6 weeks. He'll monitor his blood pressure at home. The patient has difficulty with followup plans due to lack of insurance.

## 2010-06-21 ENCOUNTER — Telehealth: Payer: Self-pay | Admitting: *Deleted

## 2010-06-21 DIAGNOSIS — E785 Hyperlipidemia, unspecified: Secondary | ICD-10-CM

## 2010-06-21 DIAGNOSIS — I10 Essential (primary) hypertension: Secondary | ICD-10-CM

## 2010-06-21 DIAGNOSIS — E119 Type 2 diabetes mellitus without complications: Secondary | ICD-10-CM

## 2010-06-21 MED ORDER — SIMVASTATIN 40 MG PO TABS: 40.0000 mg | ORAL_TABLET | Freq: Every day | ORAL | Status: DC

## 2010-06-21 MED ORDER — GLYBURIDE 5 MG PO TABS: 5.0000 mg | ORAL_TABLET | Freq: Two times a day (BID) | ORAL | Status: DC

## 2010-06-21 MED ORDER — METOPROLOL TARTRATE 50 MG PO TABS: 50.0000 mg | ORAL_TABLET | Freq: Two times a day (BID) | ORAL | Status: DC

## 2010-06-21 NOTE — Telephone Encounter (Signed)
rx sent in electronically 

## 2010-09-15 ENCOUNTER — Ambulatory Visit: Payer: Self-pay | Admitting: Internal Medicine

## 2010-09-28 ENCOUNTER — Ambulatory Visit: Payer: Self-pay | Admitting: Family Medicine

## 2010-10-03 ENCOUNTER — Ambulatory Visit: Payer: Self-pay | Admitting: Internal Medicine

## 2010-11-08 ENCOUNTER — Other Ambulatory Visit: Payer: Self-pay | Admitting: Internal Medicine

## 2010-11-09 ENCOUNTER — Other Ambulatory Visit: Payer: Self-pay | Admitting: Internal Medicine

## 2010-11-09 DIAGNOSIS — W19XXXA Unspecified fall, initial encounter: Secondary | ICD-10-CM

## 2010-11-09 DIAGNOSIS — E119 Type 2 diabetes mellitus without complications: Secondary | ICD-10-CM

## 2010-11-24 ENCOUNTER — Ambulatory Visit (INDEPENDENT_AMBULATORY_CARE_PROVIDER_SITE_OTHER): Payer: Self-pay | Admitting: Internal Medicine

## 2010-11-24 ENCOUNTER — Encounter: Payer: Self-pay | Admitting: Internal Medicine

## 2010-11-24 DIAGNOSIS — I10 Essential (primary) hypertension: Secondary | ICD-10-CM

## 2010-11-24 DIAGNOSIS — E119 Type 2 diabetes mellitus without complications: Secondary | ICD-10-CM

## 2010-11-24 DIAGNOSIS — E785 Hyperlipidemia, unspecified: Secondary | ICD-10-CM

## 2010-11-24 DIAGNOSIS — I69959 Hemiplegia and hemiparesis following unspecified cerebrovascular disease affecting unspecified side: Secondary | ICD-10-CM

## 2010-11-24 LAB — BASIC METABOLIC PANEL
BUN: 25 mg/dL — ABNORMAL HIGH (ref 6–23)
CO2: 29 mEq/L (ref 19–32)
Calcium: 8.9 mg/dL (ref 8.4–10.5)
Chloride: 101 mEq/L (ref 96–112)
Creatinine, Ser: 1.5 mg/dL (ref 0.4–1.5)
GFR: 62.51 mL/min (ref >60.00)
Glucose, Bld: 331 mg/dL — ABNORMAL HIGH (ref 70–99)
Potassium: 4 mEq/L (ref 3.5–5.1)
Sodium: 139 mEq/L (ref 135–145)

## 2010-11-24 LAB — LIPID PANEL
Cholesterol: 118 mg/dL (ref 0–200)
HDL: 37.4 mg/dL — ABNORMAL LOW (ref >39.00)
LDL Cholesterol: 61 mg/dL (ref 0–99)
Total CHOL/HDL Ratio: 3
Triglycerides: 100 mg/dL (ref 0.0–149.0)
VLDL: 20 mg/dL (ref 0.0–40.0)

## 2010-11-24 LAB — HEPATIC FUNCTION PANEL
ALT: 19 U/L (ref 0–53)
AST: 16 U/L (ref 0–37)
Albumin: 3.9 g/dL (ref 3.5–5.2)
Alkaline Phosphatase: 66 U/L (ref 39–117)
Bilirubin, Direct: 0 mg/dL (ref 0.0–0.3)
Total Bilirubin: 1.1 mg/dL (ref 0.3–1.2)
Total Protein: 7.2 g/dL (ref 6.0–8.3)

## 2010-11-24 LAB — VITAMIN B12: Vitamin B-12: 472 pg/mL (ref 211–911)

## 2010-11-24 LAB — HEMOGLOBIN A1C: Hgb A1c MFr Bld: 10.4 % — ABNORMAL HIGH (ref 4.6–6.5)

## 2010-11-24 LAB — TSH: TSH: 0.91 u[IU]/mL (ref 0.35–5.50)

## 2010-11-24 MED ORDER — INSULIN LISPRO PROT & LISPRO (75-25 MIX) 100 UNIT/ML ~~LOC~~ SUSP: 10.0000 [IU] | Freq: Two times a day (BID) | SUBCUTANEOUS | Status: DC
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
## 2010-12-03 ENCOUNTER — Ambulatory Visit
Admission: RE | Admit: 2010-12-03 | Discharge: 2010-12-03 | Disposition: A | Discharge status: Home / Self Care | Payer: Self-pay | Source: Ambulatory Visit | Attending: Internal Medicine | Admitting: Internal Medicine

## 2010-12-03 DIAGNOSIS — W19XXXA Unspecified fall, initial encounter: Secondary | ICD-10-CM

## 2010-12-03 NOTE — Assessment & Plan Note (Signed)
The patient takes terrible care of himself. This is confirmed by his family members who are present. The patient does not follow a diet or exercise plan. He smokes 3 packs per day. He does not take his insulin as prescribed. He is applying for disability in hopes of improving his ability to afford medications.

## 2010-12-03 NOTE — Assessment & Plan Note (Signed)
I'm concerned the patient may have a stroke. I referred him for MRI scanning.

## 2011-01-10 ENCOUNTER — Other Ambulatory Visit: Payer: Self-pay

## 2011-01-10 ENCOUNTER — Emergency Department (HOSPITAL_COMMUNITY)
Admission: EM | Admit: 2011-01-10 | Discharge: 2011-01-10 | Disposition: A | Discharge status: Home / Self Care | Payer: Self-pay | Attending: Emergency Medicine | Admitting: Emergency Medicine

## 2011-01-10 ENCOUNTER — Emergency Department (HOSPITAL_COMMUNITY): Payer: Self-pay

## 2011-01-10 ENCOUNTER — Encounter (HOSPITAL_COMMUNITY): Payer: Self-pay | Admitting: *Deleted

## 2011-01-10 DIAGNOSIS — R059 Cough, unspecified: Secondary | ICD-10-CM | POA: Insufficient documentation

## 2011-01-10 DIAGNOSIS — E119 Type 2 diabetes mellitus without complications: Secondary | ICD-10-CM | POA: Insufficient documentation

## 2011-01-10 DIAGNOSIS — R55 Syncope and collapse: Secondary | ICD-10-CM | POA: Insufficient documentation

## 2011-01-10 DIAGNOSIS — R05 Cough: Secondary | ICD-10-CM | POA: Insufficient documentation

## 2011-01-10 DIAGNOSIS — Z7982 Long term (current) use of aspirin: Secondary | ICD-10-CM | POA: Insufficient documentation

## 2011-01-10 DIAGNOSIS — Z79899 Other long term (current) drug therapy: Secondary | ICD-10-CM | POA: Insufficient documentation

## 2011-01-10 DIAGNOSIS — Z794 Long term (current) use of insulin: Secondary | ICD-10-CM | POA: Insufficient documentation

## 2011-01-10 DIAGNOSIS — I10 Essential (primary) hypertension: Secondary | ICD-10-CM | POA: Insufficient documentation

## 2011-01-10 DIAGNOSIS — E785 Hyperlipidemia, unspecified: Secondary | ICD-10-CM | POA: Insufficient documentation

## 2011-01-10 LAB — BASIC METABOLIC PANEL
BUN: 25 mg/dL — ABNORMAL HIGH (ref 6–23)
CO2: 28 mEq/L (ref 19–32)
Calcium: 9.7 mg/dL (ref 8.4–10.5)
Chloride: 103 mEq/L (ref 96–112)
Creatinine, Ser: 1.26 mg/dL (ref 0.50–1.35)
GFR calc Af Amer: 68 mL/min — ABNORMAL LOW (ref >90)
GFR calc non Af Amer: 59 mL/min — ABNORMAL LOW (ref >90)
Glucose, Bld: 198 mg/dL — ABNORMAL HIGH (ref 70–99)
Potassium: 3.9 mEq/L (ref 3.5–5.1)
Sodium: 138 mEq/L (ref 135–145)

## 2011-01-10 LAB — CBC
HCT: 40 % (ref 39.0–52.0)
Hemoglobin: 13.7 g/dL (ref 13.0–17.0)
MCH: 32.1 pg (ref 26.0–34.0)
MCHC: 34.3 g/dL (ref 30.0–36.0)
MCV: 93.7 fL (ref 78.0–100.0)
Platelets: 255 10*3/uL (ref 150–400)
RBC: 4.27 MIL/uL (ref 4.22–5.81)
RDW: 12.9 % (ref 11.5–15.5)
WBC: 5.6 10*3/uL (ref 4.0–10.5)

## 2011-01-10 LAB — DIFFERENTIAL
Basophils Absolute: 0 10*3/uL (ref 0.0–0.1)
Basophils Relative: 1 % (ref 0–1)
Eosinophils Absolute: 0.1 10*3/uL (ref 0.0–0.7)
Eosinophils Relative: 2 % (ref 0–5)
Lymphocytes Relative: 37 % (ref 12–46)
Lymphs Abs: 2.1 10*3/uL (ref 0.7–4.0)
Monocytes Absolute: 0.6 10*3/uL (ref 0.1–1.0)
Monocytes Relative: 11 % (ref 3–12)
Neutro Abs: 2.8 10*3/uL (ref 1.7–7.7)
Neutrophils Relative %: 49 % (ref 43–77)

## 2011-01-10 MED ORDER — SODIUM CHLORIDE 0.9 % IV BOLUS (SEPSIS)
500.0000 mL | Freq: Once | INTRAVENOUS | Status: AC
  Administered 2011-01-10: 17:00:00 via INTRAVENOUS

## 2011-01-10 NOTE — ED Notes (Signed)
Family states that they checked pt's blood sugar level after he passed out and it was around 208,
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
## 2011-01-10 NOTE — ED Notes (Signed)
Pt states that he was dizzy this am and passed out, family members states that pt was passed out for a few minutes, pt states that he has been having problems with dizziness and has been seeing his pcp without a diagnosis, pt denies any injury when he fell to floor this am when he passed out, pt denies any pain, no sob, no n/v, no diaphoresis,

## 2011-01-10 NOTE — ED Notes (Signed)
Pt reports has been having dizzy spells for the past couple of months.  Reports has had an MRI recently to evaluate possible causes.  Pt says today had been feeling dizzy.  Says was drinking some water, started coughing and then passed out.  Pt says did not hit head.   Says went down on R knee.  Pt has small abrasion to r knee.  Pt denies any symptoms at present.  Family at bedside, nurse tech obtaining orthostatic vitals.

## 2011-01-16 ENCOUNTER — Telehealth: Payer: Self-pay | Admitting: Internal Medicine

## 2011-01-16 NOTE — Telephone Encounter (Signed)
Pt went to [facility] ER re: pt passing out. Pt was taken off bp med and was told to see pcp this wk. Pt can only come in this Thurs 01/18/11 after 2pm only. Pls advise.

## 2011-01-18 NOTE — Telephone Encounter (Signed)
Pt will come on 01-19-11 1145am with dr Burnice Logan

## 2011-01-19 ENCOUNTER — Ambulatory Visit (INDEPENDENT_AMBULATORY_CARE_PROVIDER_SITE_OTHER): Payer: Self-pay | Admitting: Internal Medicine

## 2011-01-19 ENCOUNTER — Encounter: Payer: Self-pay | Admitting: Internal Medicine

## 2011-01-19 DIAGNOSIS — I10 Essential (primary) hypertension: Secondary | ICD-10-CM

## 2011-01-19 DIAGNOSIS — F17200 Nicotine dependence, unspecified, uncomplicated: Secondary | ICD-10-CM

## 2011-01-19 DIAGNOSIS — E119 Type 2 diabetes mellitus without complications: Secondary | ICD-10-CM

## 2011-01-19 MED ORDER — LISINOPRIL 20 MG PO TABS: 20.0000 mg | ORAL_TABLET | Freq: Every day | ORAL | Status: DC

## 2011-01-19 NOTE — Patient Instructions (Signed)
Limit your sodium (Salt) intake   Please check your hemoglobin A1c every 3 months  Return in one month for follow-up

## 2011-01-19 NOTE — Progress Notes (Signed)
  Subjective:    Patient ID: Harry [name], male    DOB: 1945-08-31, 65 y.o.   MRN: [id number]  HPI  65 year old patient who is seen today for followup. He was seen in the ED recently and diagnosed with cough syncope. At that time he also had some orthostatic symptoms and his blood pressure medications were held this included metoprolol 50 mg daily. He was also on combination lisinopril hydrochlorothiazide. Since his ED visit he has done quite well and denies any orthostatic symptoms. He has had no recurrent syncope. Medical issues include diabetes dyslipidemia hypertension , and a negative air cerebral vascular disease and ongoing tobacco use. He states that he has recently been approved for Medicare disability. He has been off most medications that to cost considerations.    Review of Systems  Constitutional: Negative for fever, chills, appetite change and fatigue.  HENT: Negative for hearing loss, ear pain, congestion, sore throat, trouble swallowing, neck stiffness, dental problem, voice change and tinnitus.   Eyes: Negative for pain, discharge and visual disturbance.  Respiratory: Negative for cough, chest tightness, wheezing and stridor.   Cardiovascular: Negative for chest pain, palpitations and leg swelling.  Gastrointestinal: Negative for nausea, vomiting, abdominal pain, diarrhea, constipation, blood in stool and abdominal distention.  Genitourinary: Negative for urgency, hematuria, flank pain, discharge, difficulty urinating and genital sores.  Musculoskeletal: Negative for myalgias, back pain, joint swelling, arthralgias and gait problem.  Skin: Negative for rash.  Neurological: Negative for dizziness, syncope, speech difficulty, weakness, numbness and headaches.  Hematological: Negative for adenopathy. Does not bruise/bleed easily.  Psychiatric/Behavioral: Negative for behavioral problems and dysphoric mood. The patient is not nervous/anxious.        Objective:   Physical Exam    Constitutional: He is oriented to person, place, and time. He appears well-developed.       Blood pressure 140/90 sitting 130/78 standing  HENT:  Head: Normocephalic.  Right Ear: External ear normal.  Left Ear: External ear normal.  Eyes: Conjunctivae and EOM are normal.  Neck: Normal range of motion.  Cardiovascular: Normal rate and normal heart sounds.   Pulmonary/Chest: Breath sounds normal.  Abdominal: Bowel sounds are normal.  Musculoskeletal: Normal range of motion. He exhibits no edema and no tenderness.  Neurological: He is alert and oriented to person, place, and time.  Psychiatric: He has a normal mood and affect. His behavior is normal.          Assessment & Plan:   History of cough syncope History of orthostatic hypotension. We'll continue to hold Lopressor he has been off this for a number of weeks. We'll resume lisinopril 20 mg daily and discontinue hydrochlorothiazide. To return in 2 or 3 weeks for followup

## 2011-03-06 HISTORY — PX: CATARACT EXTRACTION, BILATERAL: SHX1313

## 2011-04-17 ENCOUNTER — Other Ambulatory Visit: Payer: Self-pay | Admitting: Internal Medicine

## 2011-05-11 ENCOUNTER — Encounter: Payer: Self-pay | Admitting: Internal Medicine

## 2011-05-11 ENCOUNTER — Ambulatory Visit (INDEPENDENT_AMBULATORY_CARE_PROVIDER_SITE_OTHER): Payer: Medicare Other | Admitting: Internal Medicine

## 2011-05-11 ENCOUNTER — Other Ambulatory Visit: Payer: Self-pay

## 2011-05-11 DIAGNOSIS — K5900 Constipation, unspecified: Secondary | ICD-10-CM

## 2011-05-11 DIAGNOSIS — E785 Hyperlipidemia, unspecified: Secondary | ICD-10-CM

## 2011-05-11 DIAGNOSIS — E119 Type 2 diabetes mellitus without complications: Secondary | ICD-10-CM

## 2011-05-11 DIAGNOSIS — F17200 Nicotine dependence, unspecified, uncomplicated: Secondary | ICD-10-CM

## 2011-05-11 DIAGNOSIS — I10 Essential (primary) hypertension: Secondary | ICD-10-CM

## 2011-05-11 LAB — HEMOGLOBIN A1C: Hgb A1c MFr Bld: 9.8 % — ABNORMAL HIGH (ref 4.6–6.5)

## 2011-05-11 LAB — GLUCOSE, POCT (MANUAL RESULT ENTRY): POC Glucose: 109

## 2011-05-11 MED ORDER — INSULIN LISPRO PROT & LISPRO (75-25 MIX) 100 UNIT/ML ~~LOC~~ SUSP: SUBCUTANEOUS | Status: DC

## 2011-05-11 MED ORDER — FREESTYLE LITE DEVI: 1.0000 | Freq: Two times a day (BID) | Status: DC

## 2011-05-11 NOTE — Patient Instructions (Addendum)
Please check your hemoglobin A1c every 3 months    It is important that you exercise regularly, at least 20 minutes 3 to 4 times per week.  If you develop chest pain or shortness of breath seek  medical attention.  Limit your sodium (Salt) intake  Hold simvastatin for 2 weeks and a generalized stiffness resolves resume medication--if stiffness reoccurs then discontinue this medication  MiraLax  one scoop in 8 ounces of water daily as needed for constipation. Increase fluid intake increase fiber content of your diet and increase exercise regimen to assist constipation  Discontinue glyburide  Please see your eye doctor yearly to check for diabetic eye damage  Smoking tobacco is very bad for your health. You should stop smoking immediately.Constipation in Adults Constipation is having fewer than 2 bowel movements per week. Usually, the stools are hard. As we grow older, constipation is more common. If you try to fix constipation with laxatives, the problem may get worse. This is because laxatives taken over a long period of time make the colon muscles weaker. A low-fiber diet, not taking in enough fluids, and taking some medicines may make these problems worse. MEDICATIONS THAT MAY CAUSE CONSTIPATION  Water pills (diuretics).   Calcium channel blockers (used to control blood pressure and for the heart).   Certain pain medicines (narcotics).   Anticholinergics.   Anti-inflammatory agents.   Antacids that contain aluminum.  DISEASES THAT CONTRIBUTE TO CONSTIPATION  Diabetes.   Parkinson's disease.   Dementia.   Stroke.   Depression.   Illnesses that cause problems with salt and water metabolism.  HOME CARE INSTRUCTIONS    Constipation is usually best cared for without medicines. Increasing dietary fiber and eating more fruits and vegetables is the best way to manage constipation.   Slowly increase fiber intake to 25 to 38 grams per day. Whole grains, fruits, vegetables, and  legumes are good sources of fiber. A dietitian can further help you incorporate high-fiber foods into your diet.   Drink enough water and fluids to keep your urine clear or pale yellow.   A fiber supplement may be added to your diet if you cannot get enough fiber from foods.   Increasing your activities also helps improve regularity.   Suppositories, as suggested by your caregiver, will also help. If you are using antacids, such as aluminum or calcium containing products, it will be helpful to switch to products containing magnesium if your caregiver says it is okay.   If you have been given a liquid injection (enema) today, this is only a temporary measure. It should not be relied on for treatment of longstanding (chronic) constipation.   Stronger measures, such as magnesium sulfate, should be avoided if possible. This may cause uncontrollable diarrhea. Using magnesium sulfate may not allow you time to make it to the bathroom.  SEEK IMMEDIATE MEDICAL CARE IF:    There is bright red blood in the stool.   The constipation stays for more than 4 days.   There is belly (abdominal) or rectal pain.   You do not seem to be getting better.   You have any questions or concerns.  MAKE SURE YOU:    Understand these instructions.   Will watch your condition.   Will get help right away if you are not doing well or get worse.  Document Released: 11/18/2003 Document Revised: 02/08/2011 Document Reviewed: 01/23/2011 [facility] Patient Information 2012 [place], [place].Constipation in Adults Constipation is having fewer than 2 bowel movements per week.  Usually, the stools are hard. As we grow older, constipation is more common. If you try to fix constipation with laxatives, the problem may get worse. This is because laxatives taken over a long period of time make the colon muscles weaker. A low-fiber diet, not taking in enough fluids, and taking some medicines may make these problems worse. MEDICATIONS  THAT MAY CAUSE CONSTIPATION  Water pills (diuretics).   Calcium channel blockers (used to control blood pressure and for the heart).   Certain pain medicines (narcotics).   Anticholinergics.   Anti-inflammatory agents.   Antacids that contain aluminum.  DISEASES THAT CONTRIBUTE TO CONSTIPATION  Diabetes.   Parkinson's disease.   Dementia.   Stroke.   Depression.   Illnesses that cause problems with salt and water metabolism.  HOME CARE INSTRUCTIONS    Constipation is usually best cared for without medicines. Increasing dietary fiber and eating more fruits and vegetables is the best way to manage constipation.   Slowly increase fiber intake to 25 to 38 grams per day. Whole grains, fruits, vegetables, and legumes are good sources of fiber. A dietitian can further help you incorporate high-fiber foods into your diet.   Drink enough water and fluids to keep your urine clear or pale yellow.   A fiber supplement may be added to your diet if you cannot get enough fiber from foods.   Increasing your activities also helps improve regularity.   Suppositories, as suggested by your caregiver, will also help. If you are using antacids, such as aluminum or calcium containing products, it will be helpful to switch to products containing magnesium if your caregiver says it is okay.   If you have been given a liquid injection (enema) today, this is only a temporary measure. It should not be relied on for treatment of longstanding (chronic) constipation.   Stronger measures, such as magnesium sulfate, should be avoided if possible. This may cause uncontrollable diarrhea. Using magnesium sulfate may not allow you time to make it to the bathroom.  SEEK IMMEDIATE MEDICAL CARE IF:    There is bright red blood in the stool.   The constipation stays for more than 4 days.   There is belly (abdominal) or rectal pain.   You do not seem to be getting better.   You have any questions or  concerns.  MAKE SURE YOU:    Understand these instructions.   Will watch your condition.   Will get help right away if you are not doing well or get worse.  Document Released: 11/18/2003 Document Revised: 02/08/2011 Document Reviewed: 01/23/2011 [facility] Patient Information 2012 [place], [place].

## 2011-05-11 NOTE — Progress Notes (Signed)
Subjective:    Patient ID: Harry [name], male    DOB: Oct 25, 1945, 66 y.o.   MRN: [id number]  HPI  66 year old patient who is in today for followup of his type 2 diabetes he has not been in to see his PCP in some time. His last hemoglobin A1c was in September and was poorly controlled. He is accompanied by his wife and they both are scheduled to have diabetic teaching next week. He is on 75/25 insulin but he takes this at best only once daily. His wife states that sometimes he does not take this medication even once daily. He does have a history of a peripheral neuropathy and is complaining of numbness involving the feet especially both great toes He has a history of dyslipidemia and has been on simvastatin 40 the past 3 or 4 weeks he is complaining of some generalized stiffness and he wonders if this is related to statin therapy. He has a history of constipation and this also was a complaint today. He has treated hypertension history of cerebral vascular disease and ongoing tobacco use He states that he is scheduled to see his eye doctor in the near future he has evidence of visual difficulties on the left.    Review of Systems  Constitutional: Negative for fever, chills, appetite change and fatigue.  HENT: Negative for hearing loss, ear pain, congestion, sore throat, trouble swallowing, neck stiffness, dental problem, voice change and tinnitus.   Eyes: Positive for visual disturbance. Negative for pain and discharge.  Respiratory: Negative for cough, chest tightness, wheezing and stridor.   Cardiovascular: Positive for leg swelling (complains of some swelling involving the left foot). Negative for chest pain and palpitations.  Gastrointestinal: Positive for constipation. Negative for nausea, vomiting, abdominal pain, diarrhea, blood in stool and abdominal distention.  Genitourinary: Negative for urgency, hematuria, flank pain, discharge, difficulty urinating and genital sores.    Musculoskeletal: Positive for myalgias. Negative for back pain, joint swelling, arthralgias and gait problem.  Skin: Negative for rash.  Neurological: Positive for numbness (paresthesias both feet). Negative for dizziness, syncope, speech difficulty, weakness and headaches.  Hematological: Negative for adenopathy. Does not bruise/bleed easily.  Psychiatric/Behavioral: Negative for behavioral problems and dysphoric mood. The patient is not nervous/anxious.        Objective:   Physical Exam  Constitutional: He is oriented to person, place, and time. He appears well-developed.  HENT:  Head: Normocephalic.  Right Ear: External ear normal.  Left Ear: External ear normal.  Eyes: Conjunctivae and EOM are normal.  Neck: Normal range of motion.  Cardiovascular: Normal rate and normal heart sounds.   Pulmonary/Chest: Breath sounds normal.  Abdominal: Bowel sounds are normal.  Musculoskeletal: Normal range of motion. He exhibits no edema and no tenderness.       No significant left foot edema  Neurological: He is alert and oriented to person, place, and time.  Psychiatric: He has a normal mood and affect. His behavior is normal.          Assessment & Plan:    Diabetes mellitus. He was encouraged to follow through in diabetic teaching next week. Dietary factors were discussed. He does consume a considerable amount of soft drinks. He has been asked to increase his insulin therapy to a twice a day regimen and discontinue DiaBeta. A home glucometer was dispensed  Hypertension well controlled repeat blood pressure 130/80. We'll continue lisinopril Constipation. Issues were addressed he was asked to increase his fluid intake fiber content as well  as exercise regimen. MiraLax when necessary recommended Myalgias. He was told to hold simvastatin for 2 weeks. He will rechallenge at that time.  Hemoglobin A1c checked today Medications refilled

## 2011-05-15 ENCOUNTER — Ambulatory Visit: Payer: Self-pay | Admitting: *Deleted

## 2011-06-27 ENCOUNTER — Ambulatory Visit: Payer: Self-pay | Admitting: *Deleted

## 2011-08-05 ENCOUNTER — Other Ambulatory Visit: Payer: Self-pay | Admitting: Internal Medicine

## 2011-08-13 ENCOUNTER — Encounter: Payer: Self-pay | Admitting: Internal Medicine

## 2011-08-13 ENCOUNTER — Ambulatory Visit (INDEPENDENT_AMBULATORY_CARE_PROVIDER_SITE_OTHER): Payer: Medicare Other | Admitting: Internal Medicine

## 2011-08-13 VITALS — BP 152/90 | HR 80 | Temp 98.2°F | Wt 167.0 lb

## 2011-08-13 DIAGNOSIS — Z23 Encounter for immunization: Secondary | ICD-10-CM

## 2011-08-13 DIAGNOSIS — E119 Type 2 diabetes mellitus without complications: Secondary | ICD-10-CM

## 2011-08-13 DIAGNOSIS — F17200 Nicotine dependence, unspecified, uncomplicated: Secondary | ICD-10-CM

## 2011-08-13 DIAGNOSIS — E785 Hyperlipidemia, unspecified: Secondary | ICD-10-CM

## 2011-08-13 DIAGNOSIS — R5383 Other fatigue: Secondary | ICD-10-CM

## 2011-08-13 DIAGNOSIS — I69959 Hemiplegia and hemiparesis following unspecified cerebrovascular disease affecting unspecified side: Secondary | ICD-10-CM

## 2011-08-13 DIAGNOSIS — Z79899 Other long term (current) drug therapy: Secondary | ICD-10-CM

## 2011-08-13 DIAGNOSIS — I10 Essential (primary) hypertension: Secondary | ICD-10-CM

## 2011-08-13 DIAGNOSIS — R5381 Other malaise: Secondary | ICD-10-CM

## 2011-08-13 LAB — HEPATIC FUNCTION PANEL
ALT: 12 U/L (ref 0–53)
AST: 12 U/L (ref 0–37)
Albumin: 3.6 g/dL (ref 3.5–5.2)
Alkaline Phosphatase: 70 U/L (ref 39–117)
Bilirubin, Direct: 0.2 mg/dL (ref 0.0–0.3)
Total Bilirubin: 1.3 mg/dL — ABNORMAL HIGH (ref 0.3–1.2)
Total Protein: 6.9 g/dL (ref 6.0–8.3)

## 2011-08-13 LAB — LIPID PANEL
Cholesterol: 107 mg/dL (ref 0–200)
HDL: 36.1 mg/dL — ABNORMAL LOW (ref >39.00)
LDL Cholesterol: 56 mg/dL (ref 0–99)
Total CHOL/HDL Ratio: 3
Triglycerides: 77 mg/dL (ref 0.0–149.0)
VLDL: 15.4 mg/dL (ref 0.0–40.0)

## 2011-08-13 LAB — BASIC METABOLIC PANEL
BUN: 19 mg/dL (ref 6–23)
CO2: 30 mEq/L (ref 19–32)
Calcium: 8.8 mg/dL (ref 8.4–10.5)
Chloride: 101 mEq/L (ref 96–112)
Creatinine, Ser: 1.4 mg/dL (ref 0.4–1.5)
GFR: 67.69 mL/min (ref >60.00)
Glucose, Bld: 294 mg/dL — ABNORMAL HIGH (ref 70–99)
Potassium: 3.6 mEq/L (ref 3.5–5.1)
Sodium: 139 mEq/L (ref 135–145)

## 2011-08-13 LAB — VITAMIN B12: Vitamin B-12: 401 pg/mL (ref 211–911)

## 2011-08-13 LAB — HEMOGLOBIN A1C: Hgb A1c MFr Bld: 11.7 % — ABNORMAL HIGH (ref 4.6–6.5)

## 2011-08-13 LAB — SEDIMENTATION RATE: Sed Rate: 35 mm/hr — ABNORMAL HIGH (ref 0–22)

## 2011-08-13 LAB — TSH: TSH: 1.16 u[IU]/mL (ref 0.35–5.50)
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
## 2011-08-14 LAB — TESTOSTERONE: Testosterone: 344.86 ng/dL — ABNORMAL LOW (ref 350.00–890.00)

## 2011-08-14 NOTE — Assessment & Plan Note (Signed)
Reviewed previous cxr He understands need to quit

## 2011-08-14 NOTE — Assessment & Plan Note (Signed)
Patient is absolutely noncompliant with meds, diet and exercise Discussed with patient and wife He will have to make a choice as to whether he wants to take care of himself ... Difficult situation

## 2011-08-14 NOTE — Assessment & Plan Note (Signed)
Previously controlled Continue same meds

## 2011-08-14 NOTE — Assessment & Plan Note (Signed)
BP: 152/90 mmHg  Not controlled but he is not taking meds Advised him to be compliant with all of his meds all of the time

## 2011-12-13 ENCOUNTER — Ambulatory Visit: Payer: Medicare Other | Admitting: Internal Medicine

## 2012-09-24 ENCOUNTER — Ambulatory Visit (INDEPENDENT_AMBULATORY_CARE_PROVIDER_SITE_OTHER): Payer: Medicare Other | Admitting: Orthopedic Surgery

## 2012-09-24 ENCOUNTER — Encounter: Payer: Self-pay | Admitting: Orthopedic Surgery

## 2012-09-24 ENCOUNTER — Ambulatory Visit (INDEPENDENT_AMBULATORY_CARE_PROVIDER_SITE_OTHER): Payer: Medicare Other

## 2012-09-24 VITALS — BP 122/76 | Ht 72.0 in | Wt 177.0 lb

## 2012-09-24 DIAGNOSIS — M79605 Pain in left leg: Secondary | ICD-10-CM

## 2012-09-24 DIAGNOSIS — M545 Low back pain, unspecified: Secondary | ICD-10-CM

## 2012-09-24 DIAGNOSIS — IMO0002 Reserved for concepts with insufficient information to code with codable children: Secondary | ICD-10-CM

## 2012-09-24 DIAGNOSIS — M48061 Spinal stenosis, lumbar region without neurogenic claudication: Secondary | ICD-10-CM

## 2012-09-24 DIAGNOSIS — M4800 Spinal stenosis, site unspecified: Secondary | ICD-10-CM | POA: Insufficient documentation

## 2012-09-24 DIAGNOSIS — M5410 Radiculopathy, site unspecified: Secondary | ICD-10-CM | POA: Insufficient documentation

## 2012-09-24 MED ORDER — HYDROCODONE-ACETAMINOPHEN 5-325 MG PO TABS: 1.0000 | ORAL_TABLET | ORAL | Status: DC | PRN

## 2012-09-24 NOTE — Patient Instructions (Signed)
Smoking Cessation, Tips for Success     YOU CAN QUIT SMOKING   If you are ready to quit smoking, congratulations! You have chosen to help yourself be healthier. Cigarettes bring nicotine, tar, carbon monoxide, and other irritants into your body. Your lungs, heart, and blood vessels will be able to work better without these poisons. There are many different ways to quit smoking. Nicotine gum, nicotine patches, a nicotine inhaler, or nicotine nasal spray can help with physical craving. Hypnosis, support groups, and medicines help break the habit of smoking. Here are some tips to help you quit for good.     . Throw away all cigarettes.   . Clean and remove all ashtrays from your home, work, and car.   . On a card, write down your reasons for quitting. Carry the card with you and read it when you get the urge to smoke.   . Cleanse your body of nicotine. Drink enough water and fluids to keep your urine clear or pale yellow. Do this after quitting to flush the nicotine from your body.   . Learn to predict your moods. Do not let a bad situation be your excuse to have a cigarette. Some situations in your life might tempt you into wanting a cigarette.   . Never have "just one" cigarette. It leads to wanting another and another. Remind yourself of your decision to quit.   . Change habits associated with smoking. If you smoked while driving or when feeling stressed, try other activities to replace smoking. Stand up when drinking your coffee. Brush your teeth after eating. Sit in a different chair when you read the paper. Avoid alcohol while trying to quit, and try to drink fewer caffeinated beverages. Alcohol and caffeine may urge you to smoke.   . Avoid foods and drinks that can trigger a desire to smoke, such as sugary or spicy foods and alcohol.   . Ask people who smoke not to smoke around you.   . Have something planned to do right after eating or having a cup of coffee. Take a walk or exercise to perk you up. This will  help to keep you from overeating.   . Try a relaxation exercise to calm you down and decrease your stress. Remember, you may be tense and nervous for the first 2 weeks after you quit, but this will pass.   . Find new activities to keep your hands busy. Play with a pen, coin, or rubber band. Doodle or draw things on paper.   . Brush your teeth right after eating. This will help cut down on the craving for the taste of tobacco after meals. You can try mouthwash, too.   . Use oral substitutes, such as lemon drops, carrots, a cinnamon stick, or chewing gum, in place of cigarettes. Keep them handy so they are available when you have the urge to smoke.   . When you have the urge to smoke, try deep breathing.   . Designate your home as a nonsmoking area.   . If you are a heavy smoker, ask your caregiver about a prescription for nicotine chewing gum. It can ease your withdrawal from nicotine.   . Reward yourself. Set aside the cigarette money you save and buy yourself something nice.   . Look for support from others. Join a support group or smoking cessation program. Ask someone at home or at work to help you with your plan to quit smoking.   . Always ask   yourself, "Do I need this cigarette or is this just a reflex?" Tell yourself, "Today, I choose not to smoke," or "I do not want to smoke." You are reminding yourself of your decision to quit, even if you do smoke a cigarette.    HOW WILL I FEEL WHEN I QUIT SMOKING?     . The benefits of not smoking start within days of quitting.   . You may have symptoms of withdrawal because your body is used to nicotine (the addictive substance in cigarettes). You may crave cigarettes, be irritable, feel very hungry, cough often, get headaches, or have difficulty concentrating.   . The withdrawal symptoms are only temporary. They are strongest when you first quit but will go away within 10 to 14 days.   . When withdrawal symptoms occur, stay in control. Think about your reasons for  quitting. Remind yourself that these are signs that your body is healing and getting used to being without cigarettes.   . Remember that withdrawal symptoms are easier to treat than the major diseases that smoking can cause.   . Even after the withdrawal is over, expect periodic urges to smoke. However, these cravings are generally short-lived and will go away whether you smoke or not. Do not smoke!   . If you relapse and smoke again, do not lose hope. Most smokers quit 3 times before they are successful.   . If you relapse, do not give up! Plan ahead and think about what you will do the next time you get the urge to smoke.    LIFE AS A NONSMOKER: MAKE IT FOR A MONTH, MAKE IT FOR LIFE     Day 1: Hang this page where you will see it every day.   Day 2: Get rid of all ashtrays, matches, and lighters.   Day 3: Drink water. Breathe deeply between sips.   Day 4: Avoid places with smoke-filled air, such as bars, clubs, or the smoking section of restaurants.   Day 5: Keep track of how much money you save by not smoking.   Day 6: Avoid boredom. Keep a good book with you or go to the movies.   Day 7: Reward yourself! One week without smoking!   Day 8: Make a dental appointment to get your teeth cleaned.   Day 9: Decide how you will turn down a cigarette before it is offered to you.   Day 10: Review your reasons for quitting.   Day 11: Distract yourself. Stay active to keep your mind off smoking and to relieve tension. Take a walk, exercise, read a book, do a crossword puzzle, or try a new hobby.   Day 12: Exercise. Get off the bus before your stop or use stairs instead of escalators.   Day 13: Call on friends for support and encouragement.   Day 14: Reward yourself! Two weeks without smoking!   Day 15: Practice deep breathing exercises.   Day 16: Bet a friend that you can stay a nonsmoker.   Day 17: Ask to sit in nonsmoking sections of restaurants.   Day 18: Hang up "No Smoking" signs.   Day 19: Think of yourself as a  nonsmoker.   Day 20: Each morning, tell yourself you will not smoke.   Day 21: Reward yourself! Three weeks without smoking!   Day 22: Think of smoking in negative ways. Remember how it stains your teeth, gives you bad breath, and leaves you short of breath.   Day   23: Eat a nutritious breakfast.   Day 24:Do not relive your days as a smoker.   Day 25: Hold a pencil in your hand when talking on the telephone.   Day 26: Tell all your friends you do not smoke.   Day 27: Think about how much better food tastes.   Day 28: Remember, one cigarette is one too many.   Day 29: Take up a hobby that will keep your hands busy.   Day 30: Congratulations! One month without smoking! Give yourself a big reward.     Your caregiver can direct you to community resources or hospitals for support, which may include:   . Group support.   . Education.   . Hypnosis.   . Subliminal therapy.      Document Released: 11/18/2003 Document Revised: 02/08/2011 Document Reviewed: 12/06/2008   [employer] Patient Information 2012 [employer]
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
## 2012-10-01 ENCOUNTER — Ambulatory Visit (HOSPITAL_COMMUNITY)
Admission: RE | Admit: 2012-10-01 | Discharge: 2012-10-01 | Disposition: A | Discharge status: Home / Self Care | Payer: Medicare Other | Source: Ambulatory Visit | Attending: Orthopedic Surgery | Admitting: Orthopedic Surgery

## 2012-10-01 DIAGNOSIS — M545 Low back pain, unspecified: Secondary | ICD-10-CM | POA: Insufficient documentation

## 2012-10-01 DIAGNOSIS — M5126 Other intervertebral disc displacement, lumbar region: Secondary | ICD-10-CM | POA: Insufficient documentation

## 2012-10-01 DIAGNOSIS — M79609 Pain in unspecified limb: Secondary | ICD-10-CM | POA: Insufficient documentation

## 2012-10-01 DIAGNOSIS — M48061 Spinal stenosis, lumbar region without neurogenic claudication: Secondary | ICD-10-CM

## 2012-10-01 DIAGNOSIS — M5410 Radiculopathy, site unspecified: Secondary | ICD-10-CM

## 2012-10-07 ENCOUNTER — Ambulatory Visit (INDEPENDENT_AMBULATORY_CARE_PROVIDER_SITE_OTHER): Payer: Medicare Other | Admitting: Orthopedic Surgery

## 2012-10-07 ENCOUNTER — Encounter: Payer: Self-pay | Admitting: Orthopedic Surgery

## 2012-10-07 ENCOUNTER — Other Ambulatory Visit: Payer: Self-pay | Admitting: *Deleted

## 2012-10-07 VITALS — BP 102/76 | Ht 72.0 in | Wt 177.0 lb

## 2012-10-07 DIAGNOSIS — M545 Low back pain: Secondary | ICD-10-CM

## 2012-10-07 DIAGNOSIS — M5410 Radiculopathy, site unspecified: Secondary | ICD-10-CM

## 2012-10-07 DIAGNOSIS — IMO0002 Reserved for concepts with insufficient information to code with codable children: Secondary | ICD-10-CM

## 2012-10-07 NOTE — Progress Notes (Signed)
Patient ID: Harry [name], male   DOB: 02-01-1946, 67 y.o.   MRN: [id number] Chief Complaint  Patient presents with  . Follow-up    CT scan results of L-spine.    BP 102/76  Ht 6' (1.829 m)  Wt 177 lb (80.287 kg)  BMI 24 kg/m2 The patient still complains of right-sided leg pain as well as lower back pain and buttock pain  Review of systems he does not complain of bowel or bladder dysfunction no night sweats or fever  Physical exam is unchanged  I've reviewed the CT scan I agree with the report this is as a disc protrusion L5-S1 with deviation of the right S1 nerve root which I believe explains his symptoms  I recommend epidural steroid injection series followup in about 3 months   L5-S1: Small focal disc protrusion to the right of midline  slightly posteriorly deviating the right S1 nerve and nerve root  sleeve posteriorly.  IMPRESSION:  Small focal disc protrusion at L5-S1 to the right of midline  slightly deviating the right S1 nerve.

## 2012-10-07 NOTE — Patient Instructions (Addendum)
esi at [facility] imaging L5-S1   Epidural Steroid Injection An epidural steroid injection is given to relieve pain in the neck, back, or legs. This procedure involves injecting a steroid and numbing medicine (local anesthetic) into the epidural space. The epidural space is the space between the outer covering of the spinal cord and the vertebra. The epidural steroid injection helps in reducing the pain that is caused by the irritation or swelling of the nerve root. However, it does not cure the underlying problem. The injection may be given for the following conditions:  Changes in the soft, gel-like cushion between two vertebrae (disk) due to wear and tear.  A reduction in the space within the spinal canal.  Slipped or herniated disk.  Low back (lumbar) sprain.  Sciatica. This is shooting pain that radiates down the buttocks and the back of the leg due to compression of the nerve.  Traumatic compression fracture of the vertebra.  Pain that develops after a surgery of the spine.  Pain that arises after an attack of viral infection affecting the nerves (shingles). LET YOUR CAREGIVER KNOW ABOUT:   Allergies to food or medicine.  Medicines taken, including vitamins, herbs, eyedrops, over-the-counter medicines, and creams.  Use of steroids (by mouth or creams).  Previous problems with anesthetics or numbing medicines.  History of bleeding problems or blood clots.  Previous surgery.  Other health problems, including diabetes and kidney problems.  Possibility of pregnancy, if this applies.     RISKS AND COMPLICATIONS The complications due to the needle insertion are:  Headache.  Bleeding.  Infection.  Allergic reaction to the medicines.  Damage to the nerves. The complications due to the steroid are:  Weight gain.  Hot flashes.  Mood swings.  Lack of sleep.  Increase in blood sugar levels, especially if you are diabetic.  Retention of water. The response  to this procedure depends on the underlying cause of the pain and its duration. Patients who have long-term (chronic) pain are less likely to benefit from epidural steroids than are patients whose pain comes on strong and suddenly. BEFORE THE PROCEDURE   The caregiver may ask about your symptoms, do a detailed exam, and advise some tests. These tests may include imaging studies. Your caregiver may review the results of your tests and discuss the procedure with you.  Ask your caregiver about changing or stopping your regular medicines. You may be advised to stop taking blood-thinning medicines a few days before the procedure.  You may also be given medicines to reduce your anxiety. PROCEDURE  You will remain awake during the whole procedure. Although, you may receive medicine to make you sleepy. You will be asked to lie on your stomach. The site of the injection is cleansed. Then, the injection site is numbed using a small amount of medicine that numbs the area (local anesthetic). A hollow needle is directed through your skin into the epidural space with the help of an X-ray. The X-ray helps to ensure that the steroid is delivered closest to the affected nerve. You may have some minimal discomfort at this time. Once the needle is in the right position, the local anesthetic and the steroid are injected into the epidural space. The needle is then removed. The skin is cleaned and a bandage is applied. The entire procedure takes only a few minutes, although repeated injections may be required (up to 3 to 4 injections over several weeks).  AFTER THE PROCEDURE   You may be monitored for  a short time before you go home.  You may feel weakness or numbness in your arm or leg, which disappears within 1 to 2 hours.  Someone must drive you home or accompany you home if you are taking a taxi.  You may be allowed to eat, drink, and take your regular medicine.  Your pain may improve or worsen right after the  procedure.  You may feel the beneficial effect of the steroid a few days later.  You may have soreness at the site of the injection.  If you have only partial relief of the pain, the injection may be repeated once or even twice within 4 to 8 weeks of the initial injection. Document Released: 05/29/2007 Document Revised: 05/14/2011 Document Reviewed: 07/01/2008 [facility] Patient Information 2014 [place], [place].

## 2012-11-07 ENCOUNTER — Ambulatory Visit
Admission: RE | Admit: 2012-11-07 | Discharge: 2012-11-07 | Disposition: A | Discharge status: Home / Self Care | Payer: Medicare Other | Source: Ambulatory Visit | Attending: Orthopedic Surgery | Admitting: Orthopedic Surgery

## 2012-11-07 ENCOUNTER — Other Ambulatory Visit: Payer: Medicare Other

## 2012-11-07 DIAGNOSIS — M545 Low back pain: Secondary | ICD-10-CM

## 2012-11-07 MED ORDER — METHYLPREDNISOLONE ACETATE 40 MG/ML INJ SUSP (RADIOLOG
120.0000 mg | Freq: Once | INTRAMUSCULAR | Status: AC
  Administered 2012-11-07: 120 mg via EPIDURAL

## 2012-11-07 MED ORDER — IOHEXOL 180 MG/ML  SOLN
1.0000 mL | Freq: Once | INTRAMUSCULAR | Status: AC | PRN
  Administered 2012-11-07: 1 mL via EPIDURAL

## 2012-11-18 ENCOUNTER — Other Ambulatory Visit: Payer: Self-pay | Admitting: Orthopedic Surgery

## 2012-11-18 DIAGNOSIS — M549 Dorsalgia, unspecified: Secondary | ICD-10-CM

## 2012-11-21 ENCOUNTER — Ambulatory Visit
Admission: RE | Admit: 2012-11-21 | Discharge: 2012-11-21 | Disposition: A | Discharge status: Home / Self Care | Payer: Medicare Other | Source: Ambulatory Visit | Attending: Orthopedic Surgery | Admitting: Orthopedic Surgery

## 2012-11-21 VITALS — BP 150/85 | HR 87

## 2012-11-21 DIAGNOSIS — M549 Dorsalgia, unspecified: Secondary | ICD-10-CM

## 2012-11-21 MED ORDER — IOHEXOL 180 MG/ML  SOLN
1.0000 mL | Freq: Once | INTRAMUSCULAR | Status: AC | PRN
  Administered 2012-11-21: 1 mL via EPIDURAL

## 2012-11-21 MED ORDER — METHYLPREDNISOLONE ACETATE 40 MG/ML INJ SUSP (RADIOLOG
120.0000 mg | Freq: Once | INTRAMUSCULAR | Status: AC
  Administered 2012-11-21: 120 mg via EPIDURAL

## 2013-01-08 ENCOUNTER — Ambulatory Visit: Payer: Medicare Other | Admitting: Orthopedic Surgery

## 2013-01-13 ENCOUNTER — Other Ambulatory Visit: Payer: Self-pay | Admitting: Orthopedic Surgery

## 2013-01-13 DIAGNOSIS — M549 Dorsalgia, unspecified: Secondary | ICD-10-CM

## 2013-01-15 ENCOUNTER — Ambulatory Visit
Admission: RE | Admit: 2013-01-15 | Discharge: 2013-01-15 | Disposition: A | Discharge status: Home / Self Care | Payer: Medicare Other | Source: Ambulatory Visit | Attending: Orthopedic Surgery | Admitting: Orthopedic Surgery

## 2013-01-15 VITALS — BP 171/85 | HR 87

## 2013-01-15 DIAGNOSIS — M549 Dorsalgia, unspecified: Secondary | ICD-10-CM

## 2013-01-15 MED ORDER — IOHEXOL 180 MG/ML  SOLN
1.0000 mL | Freq: Once | INTRAMUSCULAR | Status: AC | PRN
  Administered 2013-01-15: 1 mL via EPIDURAL

## 2013-01-15 MED ORDER — METHYLPREDNISOLONE ACETATE 40 MG/ML INJ SUSP (RADIOLOG
120.0000 mg | Freq: Once | INTRAMUSCULAR | Status: AC
  Administered 2013-01-15: 120 mg via EPIDURAL

## 2013-07-17 ENCOUNTER — Other Ambulatory Visit (HOSPITAL_COMMUNITY): Payer: Self-pay | Admitting: Ophthalmology

## 2013-07-17 DIAGNOSIS — H34239 Retinal artery branch occlusion, unspecified eye: Secondary | ICD-10-CM

## 2013-07-21 ENCOUNTER — Other Ambulatory Visit (HOSPITAL_COMMUNITY): Payer: Medicare Other

## 2013-07-21 ENCOUNTER — Ambulatory Visit (HOSPITAL_COMMUNITY)
Admission: RE | Admit: 2013-07-21 | Discharge: 2013-07-21 | Disposition: A | Discharge status: Home / Self Care | Payer: Medicare Other | Source: Ambulatory Visit | Attending: Ophthalmology | Admitting: Ophthalmology

## 2013-07-21 DIAGNOSIS — I658 Occlusion and stenosis of other precerebral arteries: Secondary | ICD-10-CM | POA: Insufficient documentation

## 2013-07-21 DIAGNOSIS — H34239 Retinal artery branch occlusion, unspecified eye: Secondary | ICD-10-CM

## 2013-07-21 DIAGNOSIS — F17200 Nicotine dependence, unspecified, uncomplicated: Secondary | ICD-10-CM | POA: Insufficient documentation

## 2013-07-21 DIAGNOSIS — E119 Type 2 diabetes mellitus without complications: Secondary | ICD-10-CM | POA: Insufficient documentation

## 2013-07-22 ENCOUNTER — Ambulatory Visit (HOSPITAL_COMMUNITY)
Admission: RE | Admit: 2013-07-22 | Discharge: 2013-07-22 | Disposition: A | Discharge status: Home / Self Care | Payer: Medicare Other | Source: Ambulatory Visit | Attending: Internal Medicine | Admitting: Internal Medicine

## 2013-07-22 DIAGNOSIS — E785 Hyperlipidemia, unspecified: Secondary | ICD-10-CM | POA: Insufficient documentation

## 2013-07-22 DIAGNOSIS — F17200 Nicotine dependence, unspecified, uncomplicated: Secondary | ICD-10-CM | POA: Insufficient documentation

## 2013-07-22 DIAGNOSIS — E119 Type 2 diabetes mellitus without complications: Secondary | ICD-10-CM | POA: Insufficient documentation

## 2013-07-22 DIAGNOSIS — I10 Essential (primary) hypertension: Secondary | ICD-10-CM | POA: Insufficient documentation

## 2013-07-22 DIAGNOSIS — I517 Cardiomegaly: Secondary | ICD-10-CM

## 2013-07-22 NOTE — Progress Notes (Signed)
*  PRELIMINARY RESULTS* Echocardiogram 2D Echocardiogram has been performed.  Harry [name] 07/22/2013, 3:32 PM

## 2013-07-22 NOTE — Progress Notes (Deleted)
  Echocardiogram 2D Echocardiogram has been performed.  Harry [name] 07/22/2013, 2:50 PM

## 2013-07-24 ENCOUNTER — Ambulatory Visit (HOSPITAL_COMMUNITY)
Admission: RE | Admit: 2013-07-24 | Discharge: 2013-07-24 | Disposition: A | Discharge status: Home / Self Care | Payer: Medicare Other | Source: Ambulatory Visit | Attending: Internal Medicine | Admitting: Internal Medicine

## 2013-07-24 DIAGNOSIS — IMO0002 Reserved for concepts with insufficient information to code with codable children: Secondary | ICD-10-CM | POA: Insufficient documentation

## 2013-08-27 ENCOUNTER — Other Ambulatory Visit: Payer: Self-pay | Admitting: Internal Medicine

## 2013-08-27 DIAGNOSIS — M545 Low back pain, unspecified: Secondary | ICD-10-CM

## 2013-08-31 ENCOUNTER — Other Ambulatory Visit: Payer: Medicare Other

## 2013-09-02 ENCOUNTER — Ambulatory Visit
Admission: RE | Admit: 2013-09-02 | Discharge: 2013-09-02 | Disposition: A | Discharge status: Home / Self Care | Payer: Medicare Other | Source: Ambulatory Visit | Attending: Internal Medicine | Admitting: Internal Medicine

## 2013-09-02 VITALS — BP 167/87 | HR 92

## 2013-09-02 DIAGNOSIS — M5410 Radiculopathy, site unspecified: Secondary | ICD-10-CM

## 2013-09-02 DIAGNOSIS — M545 Low back pain, unspecified: Secondary | ICD-10-CM

## 2013-09-02 MED ORDER — IOHEXOL 180 MG/ML  SOLN
1.0000 mL | Freq: Once | INTRAMUSCULAR | Status: AC | PRN
  Administered 2013-09-02: 1 mL via EPIDURAL

## 2013-09-02 MED ORDER — METHYLPREDNISOLONE ACETATE 40 MG/ML INJ SUSP (RADIOLOG
120.0000 mg | Freq: Once | INTRAMUSCULAR | Status: AC
  Administered 2013-09-02: 120 mg via EPIDURAL

## 2013-09-02 NOTE — Discharge Instructions (Signed)

## 2013-11-13 DIAGNOSIS — H401190 Primary open-angle glaucoma, unspecified eye, stage unspecified: Secondary | ICD-10-CM | POA: Insufficient documentation

## 2014-03-23 DIAGNOSIS — H2511 Age-related nuclear cataract, right eye: Secondary | ICD-10-CM | POA: Diagnosis not present

## 2014-03-23 DIAGNOSIS — H4011X3 Primary open-angle glaucoma, severe stage: Secondary | ICD-10-CM | POA: Diagnosis not present

## 2014-04-14 DIAGNOSIS — Z0001 Encounter for general adult medical examination with abnormal findings: Secondary | ICD-10-CM | POA: Diagnosis not present

## 2014-04-14 DIAGNOSIS — E785 Hyperlipidemia, unspecified: Secondary | ICD-10-CM | POA: Diagnosis not present

## 2014-04-14 DIAGNOSIS — I10 Essential (primary) hypertension: Secondary | ICD-10-CM | POA: Diagnosis not present

## 2014-04-14 DIAGNOSIS — E1165 Type 2 diabetes mellitus with hyperglycemia: Secondary | ICD-10-CM | POA: Diagnosis not present

## 2014-05-25 DIAGNOSIS — H4011X3 Primary open-angle glaucoma, severe stage: Secondary | ICD-10-CM | POA: Diagnosis not present

## 2014-05-25 DIAGNOSIS — H2511 Age-related nuclear cataract, right eye: Secondary | ICD-10-CM | POA: Diagnosis not present

## 2014-06-02 DIAGNOSIS — E785 Hyperlipidemia, unspecified: Secondary | ICD-10-CM | POA: Diagnosis not present

## 2014-06-02 DIAGNOSIS — E1165 Type 2 diabetes mellitus with hyperglycemia: Secondary | ICD-10-CM | POA: Diagnosis not present

## 2014-06-02 DIAGNOSIS — I10 Essential (primary) hypertension: Secondary | ICD-10-CM | POA: Diagnosis not present

## 2014-06-02 DIAGNOSIS — L97901 Non-pressure chronic ulcer of unspecified part of unspecified lower leg limited to breakdown of skin: Secondary | ICD-10-CM | POA: Diagnosis not present

## 2014-06-07 DIAGNOSIS — Z79899 Other long term (current) drug therapy: Secondary | ICD-10-CM | POA: Diagnosis not present

## 2014-06-07 DIAGNOSIS — I2510 Atherosclerotic heart disease of native coronary artery without angina pectoris: Secondary | ICD-10-CM | POA: Diagnosis not present

## 2014-06-07 DIAGNOSIS — H2511 Age-related nuclear cataract, right eye: Secondary | ICD-10-CM | POA: Diagnosis not present

## 2014-06-07 DIAGNOSIS — Z794 Long term (current) use of insulin: Secondary | ICD-10-CM | POA: Diagnosis not present

## 2014-06-07 DIAGNOSIS — E119 Type 2 diabetes mellitus without complications: Secondary | ICD-10-CM | POA: Diagnosis not present

## 2014-06-07 DIAGNOSIS — I10 Essential (primary) hypertension: Secondary | ICD-10-CM | POA: Diagnosis not present

## 2014-06-07 DIAGNOSIS — J449 Chronic obstructive pulmonary disease, unspecified: Secondary | ICD-10-CM | POA: Diagnosis not present

## 2014-06-10 DIAGNOSIS — M792 Neuralgia and neuritis, unspecified: Secondary | ICD-10-CM | POA: Diagnosis not present

## 2014-06-10 DIAGNOSIS — M79671 Pain in right foot: Secondary | ICD-10-CM | POA: Diagnosis not present

## 2014-06-10 DIAGNOSIS — G609 Hereditary and idiopathic neuropathy, unspecified: Secondary | ICD-10-CM | POA: Diagnosis not present

## 2014-06-17 DIAGNOSIS — M792 Neuralgia and neuritis, unspecified: Secondary | ICD-10-CM | POA: Diagnosis not present

## 2014-06-23 ENCOUNTER — Ambulatory Visit (INDEPENDENT_AMBULATORY_CARE_PROVIDER_SITE_OTHER): Payer: Medicare Other | Admitting: Cardiovascular Disease

## 2014-06-23 ENCOUNTER — Encounter: Payer: Self-pay | Admitting: Cardiovascular Disease

## 2014-06-23 VITALS — BP 136/78 | HR 88 | Ht 72.0 in | Wt 191.8 lb

## 2014-06-23 DIAGNOSIS — I739 Peripheral vascular disease, unspecified: Secondary | ICD-10-CM | POA: Diagnosis not present

## 2014-06-23 DIAGNOSIS — Z720 Tobacco use: Secondary | ICD-10-CM

## 2014-06-23 DIAGNOSIS — F17200 Nicotine dependence, unspecified, uncomplicated: Secondary | ICD-10-CM

## 2014-06-23 DIAGNOSIS — I998 Other disorder of circulatory system: Secondary | ICD-10-CM | POA: Diagnosis not present

## 2014-06-23 DIAGNOSIS — I10 Essential (primary) hypertension: Secondary | ICD-10-CM | POA: Diagnosis not present

## 2014-06-23 DIAGNOSIS — I70229 Atherosclerosis of native arteries of extremities with rest pain, unspecified extremity: Secondary | ICD-10-CM | POA: Insufficient documentation

## 2014-06-23 NOTE — Assessment & Plan Note (Signed)
The patient continues to smoke one pack per day as recalcitrant risk factor modification

## 2014-06-23 NOTE — Assessment & Plan Note (Signed)
History of hypertension blood pressure measures today 136/78. He is on lisinopril 20 mg a day. Continue current meds at current dosing

## 2014-06-23 NOTE — Progress Notes (Signed)
06/23/2014 Harry [name]   1945/07/17  [id number]  Primary Physician Harry Fire, MD Primary Cardiologist: Harry Harp MD Harry [name]   HPI:  Harry [name] is a 69 year old married African-American male father of 3 children, grandfather of 3 grandchildren who was a copy by his wife Harry [name]. He was referred by Harry [name], his podiatrist, for evaluation of critical limb ischemia. His primary care physician is Harry [name] and [facility]. He is a retired Administrator for [employer]. His cardiovascular risk factor profile is notable for 50-pack-years of tobacco abuse currently smoking one pack per day and recalcitrant risk factor modification, treated diabetes, hypertension and hyperlipidemia. 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 He denies chest pain or shortness of breath. He does have hip buttock and thigh heaviness with ambulation which may or may not be claudication as well as a healing right second toe ulcer probably related to diabetic nephropathy and physical trauma.   Current Outpatient Prescriptions  Medication Sig Dispense Refill  . aspirin EC 81 MG tablet Take 1 tablet by mouth daily.    . Blood Glucose Monitoring Suppl (FREESTYLE LITE) DEVI 1 each by Does not apply route 2 (two) times daily. 100 each 1  . brimonidine (ALPHAGAN) 0.2 % ophthalmic solution Apply 1 drop to eye 2 (two) times daily.    . dorzolamide-timolol (COSOPT) 22.3-6.8 MG/ML ophthalmic solution Apply 1 drop to eye 2 (two) times daily.    [name] Kitchen ibuprofen (ADVIL,MOTRIN) 200 MG tablet Take 1 tablet by mouth every 6 (six) hours as needed.    [name] Kitchen lisinopril (PRINIVIL,ZESTRIL) 20 MG tablet Take 1 tablet by mouth daily.    . prednisoLONE acetate (PRED FORTE) 1 % ophthalmic suspension Place 1 drop into the right eye 4 (four) times daily.    . simvastatin (ZOCOR) 40 MG tablet TAKE ONE TABLET BY MOUTH EVERY  NIGHT    AT BEDTIME 90 tablet 1   No current facility-administered medications for this visit.    No Known Allergies  History   Social History  . Marital Status: Married    Spouse Name: N/A  . Number of Children: N/A  . Years of Education: N/A   Occupational History  . Not on file.   Social History Main Topics  . Smoking status: Current Every Day Smoker -- 0.50 packs/day    Types: Cigarettes  . Smokeless tobacco: Never Used  . Alcohol Use: No  . Drug Use: No  . Sexual Activity: Not on file   Other Topics Concern  . Not on file   Social History Narrative     Review of Systems: General: negative for chills, fever, night sweats or weight changes.  Cardiovascular: negative for chest pain, dyspnea on exertion, edema, orthopnea, palpitations, paroxysmal nocturnal dyspnea or shortness of breath Dermatological: negative for rash Respiratory: negative for cough or wheezing Urologic: negative for hematuria Abdominal: negative for nausea, vomiting, diarrhea, bright red blood per rectum, melena, or hematemesis Neurologic: negative for visual changes, syncope, or dizziness All other systems reviewed and are otherwise negative except as noted above.    Blood pressure 136/78, pulse 88, height 6' (1.829 m), weight 191 lb 12.8 oz (87 kg).  General appearance: alert and no distress Neck: no adenopathy, no carotid bruit, no JVD, supple, symmetrical, trachea midline and thyroid not enlarged, symmetric, no tenderness/mass/nodules Lungs: clear to auscultation bilaterally Heart: regular rate and  rhythm, S1, S2 normal, no murmur, click, rub or gallop Extremities: extremities normal, atraumatic, no cyanosis or edema and 2+ femoral pulses without bruits, absent pedal pulses  EKG not performed today  ASSESSMENT AND PLAN:   TOBACCO USE The patient continues to smoke one pack per day as recalcitrant risk factor modification   Essential hypertension History of hypertension blood  pressure measures today 136/78. He is on lisinopril 20 mg a day. Continue current meds at current dosing   Critical lower limb ischemia The patient was referred to me. Harry [name], podiatrist at [facility], for evaluation of critical limb ischemia. History of factors include treated diabetes, hypertension, hyperlipidemia and continued tobacco abuse as well as family history with a sister has had stents. He is complaining of bilateral hip buttock and thigh heaviness with ambulation to less than several years with a healing ulcer on his right second toe over the last month. He does also say that he's had "bulging disks which she attributed the symptoms to. He has diminished pedal pulses on exam but does hasn't have strong femoral pulses without bruits. 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 I'm going to get lower extremity arterial Doppler studies I will see him back if he has anatomy suitable for intervention otherwise on a when necessary basis.       Harry Harp MD FACP,FACC,FAHA, [facility] 06/23/2014 9:43 AM

## 2014-06-23 NOTE — Patient Instructions (Signed)
Dr Gwenlyn Found has ordered the following test(s) to be done: 1. Lower Extremity Arterial Doppler - This test is an ultrasound of the arteries in the legs. It looks at arterial blood flow in the legs. Allow one hour for Lower Arterial scans. There are no restrictions or special instructions.  Dr Gwenlyn Found wants you to follow-up as needed. You will be called with your results from this study.

## 2014-06-23 NOTE — Assessment & Plan Note (Signed)
The patient was referred to me. Dr. Barkley Bruns, podiatrist at [facility], for evaluation of critical limb ischemia. History of factors include treated diabetes, hypertension, hyperlipidemia and continued tobacco abuse as well as family history with a sister has had stents. He is complaining of bilateral hip buttock and thigh heaviness with ambulation to less than several years with a healing ulcer on his right second toe over the last month. He does also say that he's had "bulging disks which she attributed the symptoms to. He has diminished pedal pulses on exam but does hasn't have strong femoral pulses without bruits. 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 I'm going to get lower extremity arterial Doppler studies I will see him back if he has anatomy suitable for intervention otherwise on a when necessary basis.

## 2014-07-02 DIAGNOSIS — I10 Essential (primary) hypertension: Secondary | ICD-10-CM | POA: Diagnosis not present

## 2014-07-02 DIAGNOSIS — E119 Type 2 diabetes mellitus without complications: Secondary | ICD-10-CM | POA: Diagnosis not present

## 2014-07-02 DIAGNOSIS — S91301S Unspecified open wound, right foot, sequela: Secondary | ICD-10-CM | POA: Diagnosis not present

## 2014-07-09 ENCOUNTER — Ambulatory Visit (HOSPITAL_COMMUNITY)
Admission: RE | Admit: 2014-07-09 | Discharge: 2014-07-09 | Disposition: A | Discharge status: Home / Self Care | Payer: Medicare Other | Source: Ambulatory Visit | Attending: Cardiology | Admitting: Cardiology

## 2014-07-09 DIAGNOSIS — I739 Peripheral vascular disease, unspecified: Secondary | ICD-10-CM | POA: Diagnosis not present

## 2014-07-09 DIAGNOSIS — I998 Other disorder of circulatory system: Secondary | ICD-10-CM

## 2014-07-09 DIAGNOSIS — I70229 Atherosclerosis of native arteries of extremities with rest pain, unspecified extremity: Secondary | ICD-10-CM

## 2014-07-13 DIAGNOSIS — H4011X3 Primary open-angle glaucoma, severe stage: Secondary | ICD-10-CM | POA: Diagnosis not present

## 2014-07-13 DIAGNOSIS — H40011 Open angle with borderline findings, low risk, right eye: Secondary | ICD-10-CM | POA: Insufficient documentation

## 2014-07-15 DIAGNOSIS — L97519 Non-pressure chronic ulcer of other part of right foot with unspecified severity: Secondary | ICD-10-CM | POA: Diagnosis not present

## 2014-07-20 ENCOUNTER — Telehealth: Payer: Self-pay | Admitting: Cardiovascular Disease

## 2014-07-21 NOTE — Telephone Encounter (Signed)
Closed encounter °

## 2014-07-30 DIAGNOSIS — I10 Essential (primary) hypertension: Secondary | ICD-10-CM | POA: Diagnosis not present

## 2014-07-30 DIAGNOSIS — N182 Chronic kidney disease, stage 2 (mild): Secondary | ICD-10-CM | POA: Diagnosis not present

## 2014-07-30 DIAGNOSIS — F17200 Nicotine dependence, unspecified, uncomplicated: Secondary | ICD-10-CM | POA: Diagnosis not present

## 2014-07-30 DIAGNOSIS — E1165 Type 2 diabetes mellitus with hyperglycemia: Secondary | ICD-10-CM | POA: Diagnosis not present

## 2014-08-03 ENCOUNTER — Ambulatory Visit: Payer: Medicare Other | Admitting: Cardiovascular Disease

## 2014-08-17 ENCOUNTER — Encounter: Payer: Self-pay | Admitting: *Deleted

## 2014-08-25 ENCOUNTER — Encounter: Payer: Self-pay | Admitting: Cardiovascular Disease

## 2014-08-25 ENCOUNTER — Ambulatory Visit
Admission: RE | Admit: 2014-08-25 | Discharge: 2014-08-25 | Disposition: A | Discharge status: Home / Self Care | Payer: Medicare Other | Source: Ambulatory Visit | Attending: Cardiovascular Disease | Admitting: Cardiovascular Disease

## 2014-08-25 ENCOUNTER — Ambulatory Visit (INDEPENDENT_AMBULATORY_CARE_PROVIDER_SITE_OTHER): Payer: Medicare Other | Admitting: Cardiovascular Disease

## 2014-08-25 VITALS — BP 126/80 | HR 80 | Ht 72.0 in | Wt 195.0 lb

## 2014-08-25 DIAGNOSIS — R5381 Other malaise: Secondary | ICD-10-CM | POA: Diagnosis not present

## 2014-08-25 DIAGNOSIS — I739 Peripheral vascular disease, unspecified: Secondary | ICD-10-CM

## 2014-08-25 DIAGNOSIS — D689 Coagulation defect, unspecified: Secondary | ICD-10-CM

## 2014-08-25 DIAGNOSIS — Z79899 Other long term (current) drug therapy: Secondary | ICD-10-CM

## 2014-08-25 DIAGNOSIS — Z01818 Encounter for other preprocedural examination: Secondary | ICD-10-CM | POA: Diagnosis not present

## 2014-08-25 DIAGNOSIS — R5383 Other fatigue: Secondary | ICD-10-CM

## 2014-08-25 NOTE — Patient Instructions (Addendum)
Dr. Gwenlyn Found has ordered a peripheral angiogram to be done at [facility] next week.  This procedure is going to look at the bloodflow in your lower extremities.  If Dr. Gwenlyn Found is able to open up the arteries, you will have to spend one night in the hospital.  If he is not able to open the arteries, you will be able to go home that same day.    After the procedure, you will not be allowed to drive for 3 days or push, pull, or lift anything greater than 10 lbs for one week.    You will be required to have the following tests prior to the procedure:  1. Blood work-the blood work can be done no more than 7 days prior to the procedure.  It can be done at any [facility] lab.  There is one downstairs on the first floor of this building and one in the [place] ([address])  2. Chest Xray-the chest xray order has already been placed at the [facility].     *REPS Scott    right groin access

## 2014-08-25 NOTE — Progress Notes (Signed)
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 He was referred by Dr. Barkley Bruns, his podiatrist, for evaluation of critical limb ischemia. His primary care physician is Dr. Legrand Rams and [facility]. He is a retired Administrator for [employer]. His cardiovascular risk factor profile is notable for 50-pack-years of tobacco abuse currently smoking one pack per day and recalcitrant risk factor modification, treated diabetes, hypertension and hyperlipidemia. 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 He denies chest pain or shortness of breath. He does have hip buttock and thigh heaviness with ambulation which may or may not be claudication as well as a healing right second toe ulcer probably related to diabetic nephropathy and physical trauma. Since I saw him 2 months ago his right second toe ulcer has healed. Dopplers performed in our office 07/09/14 revealed ABIs in the 0.7 range with occluded SFAs bilaterally and tibial disease on the right.   Current Outpatient Prescriptions  Medication Sig Dispense Refill  . Blood Glucose Monitoring Suppl (FREESTYLE LITE) DEVI 1 each by Does not apply route 2 (two) times daily. 100 each 1  . brimonidine (ALPHAGAN) 0.2 % ophthalmic solution Apply 1 drop to eye 2 (two) times daily.    . dorzolamide-timolol (COSOPT) 22.3-6.8 MG/ML ophthalmic solution Apply 1 drop to eye 2 (two) times daily.    [name] Kitchen gabapentin (NEURONTIN) 300 MG capsule Take 300 mg by mouth 3 (three) times daily.  1  . ibuprofen (ADVIL,MOTRIN) 200 MG tablet Take 1 tablet by mouth every 6 (six) hours as  needed.    [name] Kitchen LANTUS SOLOSTAR 100 UNIT/ML Solostar Pen INJECT40 UNITS SUBCUTANEOUS AT BEDTIME  3  . losartan (COZAAR) 50 MG tablet Take 50 mg by mouth daily.      No current facility-administered medications for this visit.    No Known Allergies  History   Social History  . Marital Status: Married    Spouse Name: N/A  . Number of Children: N/A  . Years of Education: N/A   Occupational History  . Not on file.   Social History Main Topics  . Smoking status: Current Every Day Smoker -- 0.50 packs/day    Types: Cigarettes  . Smokeless tobacco: Never Used  . Alcohol Use: No  . Drug Use: No  . Sexual Activity: Not on file   Other Topics Concern  . Not on file   Social History Narrative     Review of Systems: General: negative for chills, fever, night sweats or weight changes.  Cardiovascular: negative for chest pain, dyspnea on exertion, edema, orthopnea, palpitations, paroxysmal nocturnal dyspnea or shortness of breath Dermatological: negative for rash Respiratory: negative for cough or wheezing Urologic: negative for hematuria Abdominal: negative for nausea, vomiting, diarrhea, bright red blood per rectum, melena, or hematemesis Neurologic: negative for visual changes, syncope, or dizziness All other systems reviewed and are otherwise negative except as noted above.    Blood pressure 126/80, pulse 80, height 6' (1.829 m), weight 195 lb (88.451 kg).  General appearance: alert and no distress Neck: no adenopathy, no carotid bruit, no JVD, supple, symmetrical, trachea midline  and thyroid not enlarged, symmetric, no tenderness/mass/nodules Lungs: clear to auscultation bilaterally Heart: regular rate and rhythm, S1, S2 normal, no murmur, click, rub or gallop Extremities: extremities normal, atraumatic, no cyanosis or edema  EKG not performed today  ASSESSMENT AND PLAN:   Critical lower limb ischemia I am seeing Mr. Bondoc back today for follow-up of his Doppler studies  performed one month ago. He was initially sent to me because of a poorly healing right second toe ulcer which has subsequently healed as well as claudication-type symptoms. 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 Discussed options including observation, medical therapy and percutaneous revascularization which the patient wishes to pursue.  We discussed the risks and benefits. I'm going to arrange for him to undergo this sometime within the next several weeks. His wife and daughter were present during the interview.      Lorretta Harp MD FACP,FACC,FAHA, [facility] [address] 08/25/2014 9:54 AM

## 2014-08-25 NOTE — Assessment & Plan Note (Signed)
I am seeing [name] back today for follow-up of his Doppler studies performed one month ago. He was initially sent to me because of a poorly healing right second toe ulcer which has subsequently healed as well as claudication-type symptoms. 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 Discussed options including observation, medical therapy and percutaneous revascularization which the patient wishes to pursue.  We discussed the risks and benefits. I'm going to arrange for him to undergo this sometime within the next several weeks. His wife and daughter were present during the interview.

## 2014-08-26 ENCOUNTER — Encounter (HOSPITAL_COMMUNITY): Payer: Self-pay | Admitting: Pharmacy Technician

## 2014-08-26 LAB — CBC
HCT: 34.5 % — ABNORMAL LOW (ref 39.0–52.0)
Hemoglobin: 11.5 g/dL — ABNORMAL LOW (ref 13.0–17.0)
MCH: 31.5 pg (ref 26.0–34.0)
MCHC: 33.3 g/dL (ref 30.0–36.0)
MCV: 94.5 fL (ref 78.0–100.0)
MPV: 8.8 fL (ref 8.6–12.4)
Platelets: 226 10*3/uL (ref 150–400)
RBC: 3.65 MIL/uL — ABNORMAL LOW (ref 4.22–5.81)
RDW: 14.7 % (ref 11.5–15.5)
WBC: 5.5 10*3/uL (ref 4.0–10.5)

## 2014-08-26 LAB — BASIC METABOLIC PANEL
BUN: 24 mg/dL — ABNORMAL HIGH (ref 6–23)
CO2: 22 mEq/L (ref 19–32)
Calcium: 8.4 mg/dL (ref 8.4–10.5)
Chloride: 106 mEq/L (ref 96–112)
Creat: 1.73 mg/dL — ABNORMAL HIGH (ref 0.50–1.35)
Glucose, Bld: 248 mg/dL — ABNORMAL HIGH (ref 70–99)
Potassium: 4.2 mEq/L (ref 3.5–5.3)
Sodium: 141 mEq/L (ref 135–145)

## 2014-08-26 LAB — TSH: TSH: 1.515 u[IU]/mL (ref 0.350–4.500)

## 2014-08-26 LAB — APTT: aPTT: 31 seconds (ref 24–37)

## 2014-08-26 LAB — PROTIME-INR
INR: 1.13 (ref <1.50)
Prothrombin Time: 14.5 seconds (ref 11.6–15.2)

## 2014-08-27 ENCOUNTER — Telehealth: Payer: Self-pay | Admitting: *Deleted

## 2014-08-27 NOTE — Telephone Encounter (Signed)
I spoke with Elmyra Ricks in admitting and requested a bed for patient for admission on Sunday, June 26.  Tele bed; number to reach patient [phone]

## 2014-08-29 ENCOUNTER — Inpatient Hospital Stay (HOSPITAL_COMMUNITY)
Admission: RE | Admit: 2014-08-29 | Discharge: 2014-08-31 | DRG: 253 | Disposition: A | Discharge status: Home / Self Care | Payer: Medicare Other | Source: Ambulatory Visit | Attending: Internal Medicine | Admitting: Internal Medicine

## 2014-08-29 ENCOUNTER — Encounter (HOSPITAL_COMMUNITY): Payer: Self-pay | Admitting: Cardiology

## 2014-08-29 ENCOUNTER — Other Ambulatory Visit: Payer: Self-pay

## 2014-08-29 DIAGNOSIS — I70213 Atherosclerosis of native arteries of extremities with intermittent claudication, bilateral legs: Principal | ICD-10-CM | POA: Diagnosis present

## 2014-08-29 DIAGNOSIS — F17210 Nicotine dependence, cigarettes, uncomplicated: Secondary | ICD-10-CM | POA: Diagnosis present

## 2014-08-29 DIAGNOSIS — I739 Peripheral vascular disease, unspecified: Secondary | ICD-10-CM | POA: Insufficient documentation

## 2014-08-29 DIAGNOSIS — Z8673 Personal history of transient ischemic attack (TIA), and cerebral infarction without residual deficits: Secondary | ICD-10-CM | POA: Diagnosis not present

## 2014-08-29 DIAGNOSIS — I129 Hypertensive chronic kidney disease with stage 1 through stage 4 chronic kidney disease, or unspecified chronic kidney disease: Secondary | ICD-10-CM | POA: Diagnosis present

## 2014-08-29 DIAGNOSIS — Z794 Long term (current) use of insulin: Secondary | ICD-10-CM

## 2014-08-29 DIAGNOSIS — N183 Chronic kidney disease, stage 3 (moderate): Secondary | ICD-10-CM | POA: Diagnosis present

## 2014-08-29 DIAGNOSIS — F17200 Nicotine dependence, unspecified, uncomplicated: Secondary | ICD-10-CM | POA: Diagnosis present

## 2014-08-29 DIAGNOSIS — I7092 Chronic total occlusion of artery of the extremities: Secondary | ICD-10-CM | POA: Diagnosis present

## 2014-08-29 DIAGNOSIS — I10 Essential (primary) hypertension: Secondary | ICD-10-CM

## 2014-08-29 DIAGNOSIS — E1122 Type 2 diabetes mellitus with diabetic chronic kidney disease: Secondary | ICD-10-CM

## 2014-08-29 DIAGNOSIS — E1140 Type 2 diabetes mellitus with diabetic neuropathy, unspecified: Secondary | ICD-10-CM | POA: Diagnosis present

## 2014-08-29 DIAGNOSIS — E1169 Type 2 diabetes mellitus with other specified complication: Secondary | ICD-10-CM

## 2014-08-29 DIAGNOSIS — I70229 Atherosclerosis of native arteries of extremities with rest pain, unspecified extremity: Secondary | ICD-10-CM | POA: Diagnosis present

## 2014-08-29 DIAGNOSIS — I70212 Atherosclerosis of native arteries of extremities with intermittent claudication, left leg: Secondary | ICD-10-CM | POA: Diagnosis not present

## 2014-08-29 DIAGNOSIS — Z79899 Other long term (current) drug therapy: Secondary | ICD-10-CM

## 2014-08-29 DIAGNOSIS — I998 Other disorder of circulatory system: Secondary | ICD-10-CM | POA: Diagnosis not present

## 2014-08-29 DIAGNOSIS — E785 Hyperlipidemia, unspecified: Secondary | ICD-10-CM | POA: Diagnosis present

## 2014-08-29 LAB — CBC
HCT: 37.7 % — ABNORMAL LOW (ref 39.0–52.0)
Hemoglobin: 12.6 g/dL — ABNORMAL LOW (ref 13.0–17.0)
MCH: 31 pg (ref 26.0–34.0)
MCHC: 33.4 g/dL (ref 30.0–36.0)
MCV: 92.6 fL (ref 78.0–100.0)
Platelets: 264 10*3/uL (ref 150–400)
RBC: 4.07 MIL/uL — ABNORMAL LOW (ref 4.22–5.81)
RDW: 13.8 % (ref 11.5–15.5)
WBC: 5.8 10*3/uL (ref 4.0–10.5)

## 2014-08-29 LAB — GLUCOSE, CAPILLARY
Glucose-Capillary: 195 mg/dL — ABNORMAL HIGH (ref 65–99)
Glucose-Capillary: 197 mg/dL — ABNORMAL HIGH (ref 65–99)
Glucose-Capillary: 128 mg/dL — ABNORMAL HIGH (ref 65–99)

## 2014-08-29 LAB — CREATININE, SERUM
Creatinine, Ser: 1.57 mg/dL — ABNORMAL HIGH (ref 0.61–1.24)
GFR calc Af Amer: 51 mL/min — ABNORMAL LOW (ref >60)
GFR calc non Af Amer: 44 mL/min — ABNORMAL LOW (ref >60)

## 2014-08-29 MED ORDER — ASPIRIN 81 MG PO CHEW
81.0000 mg | CHEWABLE_TABLET | ORAL | Status: AC
  Administered 2014-08-30: 81 mg via ORAL
  Filled 2014-08-29: qty 1

## 2014-08-29 MED ORDER — SODIUM CHLORIDE 0.9 % WEIGHT BASED INFUSION
3.0000 mL/kg/h | INTRAVENOUS | Status: AC
  Administered 2014-08-30: 3 mL/kg/h via INTRAVENOUS

## 2014-08-29 MED ORDER — NICOTINE 14 MG/24HR TD PT24
14.0000 mg | MEDICATED_PATCH | Freq: Every day | TRANSDERMAL | Status: DC
  Administered 2014-08-29 – 2014-08-31 (×3): 14 mg via TRANSDERMAL
  Filled 2014-08-29 (×4): qty 1

## 2014-08-29 MED ORDER — DORZOLAMIDE HCL-TIMOLOL MAL 2-0.5 % OP SOLN
1.0000 [drp] | Freq: Two times a day (BID) | OPHTHALMIC | Status: DC
  Administered 2014-08-29 – 2014-08-31 (×4): 1 [drp] via OPHTHALMIC
  Filled 2014-08-29: qty 10

## 2014-08-29 MED ORDER — SODIUM CHLORIDE 0.9 % WEIGHT BASED INFUSION
1.0000 mL/kg/h | INTRAVENOUS | Status: DC
  Administered 2014-08-30: 1 mL/kg/h via INTRAVENOUS

## 2014-08-29 MED ORDER — ACETAMINOPHEN 650 MG RE SUPP: 650.0000 mg | Freq: Four times a day (QID) | RECTAL | Status: DC | PRN

## 2014-08-29 MED ORDER — POLYETHYLENE GLYCOL 3350 17 G PO PACK
17.0000 g | PACK | Freq: Every day | ORAL | Status: DC | PRN
  Filled 2014-08-29: qty 1

## 2014-08-29 MED ORDER — BRIMONIDINE TARTRATE 0.2 % OP SOLN
1.0000 [drp] | Freq: Two times a day (BID) | OPHTHALMIC | Status: DC
  Administered 2014-08-29 – 2014-08-31 (×4): 1 [drp] via OPHTHALMIC
  Filled 2014-08-29: qty 5

## 2014-08-29 MED ORDER — ASPIRIN EC 81 MG PO TBEC
81.0000 mg | DELAYED_RELEASE_TABLET | Freq: Every day | ORAL | Status: DC
  Administered 2014-08-29: 81 mg via ORAL
  Filled 2014-08-29 (×2): qty 1

## 2014-08-29 MED ORDER — INSULIN ASPART 100 UNIT/ML ~~LOC~~ SOLN
0.0000 [IU] | Freq: Three times a day (TID) | SUBCUTANEOUS | Status: DC
Start: 2014-08-29 — End: 2014-08-31
  Administered 2014-08-29: 2 [IU] via SUBCUTANEOUS
  Administered 2014-08-31: 1 [IU] via SUBCUTANEOUS

## 2014-08-29 MED ORDER — GABAPENTIN 300 MG PO CAPS
300.0000 mg | ORAL_CAPSULE | Freq: Three times a day (TID) | ORAL | Status: DC
  Administered 2014-08-29 – 2014-08-31 (×5): 300 mg via ORAL
  Filled 2014-08-29 (×7): qty 1

## 2014-08-29 MED ORDER — INSULIN ASPART 100 UNIT/ML ~~LOC~~ SOLN: 0.0000 [IU] | Freq: Every day | SUBCUTANEOUS | Status: DC

## 2014-08-29 MED ORDER — INSULIN GLARGINE 100 UNIT/ML ~~LOC~~ SOLN
10.0000 [IU] | Freq: Two times a day (BID) | SUBCUTANEOUS | Status: DC
  Administered 2014-08-29 – 2014-08-31 (×4): 10 [IU] via SUBCUTANEOUS
  Filled 2014-08-29 (×7): qty 0.1

## 2014-08-29 MED ORDER — ONDANSETRON HCL 4 MG/2ML IJ SOLN: 4.0000 mg | Freq: Four times a day (QID) | INTRAMUSCULAR | Status: DC | PRN

## 2014-08-29 MED ORDER — HEPARIN SODIUM (PORCINE) 5000 UNIT/ML IJ SOLN
5000.0000 [IU] | Freq: Three times a day (TID) | INTRAMUSCULAR | Status: DC
  Administered 2014-08-29 – 2014-08-30 (×3): 5000 [IU] via SUBCUTANEOUS
  Filled 2014-08-29 (×6): qty 1

## 2014-08-29 MED ORDER — ACETAMINOPHEN 325 MG PO TABS: 650.0000 mg | ORAL_TABLET | Freq: Four times a day (QID) | ORAL | Status: DC | PRN

## 2014-08-29 MED ORDER — ALUM & MAG HYDROXIDE-SIMETH 200-200-20 MG/5ML PO SUSP: 30.0000 mL | Freq: Four times a day (QID) | ORAL | Status: DC | PRN

## 2014-08-29 MED ORDER — ZOLPIDEM TARTRATE 5 MG PO TABS: 5.0000 mg | ORAL_TABLET | Freq: Every evening | ORAL | Status: DC | PRN

## 2014-08-29 MED ORDER — SODIUM CHLORIDE 0.9 % IJ SOLN
3.0000 mL | Freq: Two times a day (BID) | INTRAMUSCULAR | Status: DC
  Administered 2014-08-29 (×2): 3 mL via INTRAVENOUS

## 2014-08-29 MED ORDER — SODIUM CHLORIDE 0.9 % IV SOLN
INTRAVENOUS | Status: DC
  Administered 2014-08-29: 14:00:00 via INTRAVENOUS

## 2014-08-29 MED ORDER — SODIUM CHLORIDE 0.9 % IJ SOLN: 3.0000 mL | INTRAMUSCULAR | Status: DC | PRN

## 2014-08-29 MED ORDER — FAMOTIDINE 20 MG PO TABS
20.0000 mg | ORAL_TABLET | Freq: Two times a day (BID) | ORAL | Status: DC
  Administered 2014-08-29 – 2014-08-31 (×4): 20 mg via ORAL
  Filled 2014-08-29 (×6): qty 1

## 2014-08-29 MED ORDER — ONDANSETRON HCL 4 MG PO TABS: 4.0000 mg | ORAL_TABLET | Freq: Four times a day (QID) | ORAL | Status: DC | PRN
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
## 2014-08-30 ENCOUNTER — Encounter (HOSPITAL_COMMUNITY)
Admission: RE | Disposition: A | Discharge status: Home / Self Care | Payer: Medicare Other | Source: Ambulatory Visit | Attending: Internal Medicine

## 2014-08-30 ENCOUNTER — Other Ambulatory Visit: Payer: Self-pay

## 2014-08-30 DIAGNOSIS — I70212 Atherosclerosis of native arteries of extremities with intermittent claudication, left leg: Secondary | ICD-10-CM | POA: Diagnosis not present

## 2014-08-30 DIAGNOSIS — E785 Hyperlipidemia, unspecified: Secondary | ICD-10-CM | POA: Diagnosis not present

## 2014-08-30 DIAGNOSIS — I739 Peripheral vascular disease, unspecified: Secondary | ICD-10-CM | POA: Diagnosis not present

## 2014-08-30 DIAGNOSIS — I129 Hypertensive chronic kidney disease with stage 1 through stage 4 chronic kidney disease, or unspecified chronic kidney disease: Secondary | ICD-10-CM | POA: Diagnosis not present

## 2014-08-30 DIAGNOSIS — N183 Chronic kidney disease, stage 3 (moderate): Secondary | ICD-10-CM | POA: Diagnosis not present

## 2014-08-30 DIAGNOSIS — I70213 Atherosclerosis of native arteries of extremities with intermittent claudication, bilateral legs: Secondary | ICD-10-CM | POA: Diagnosis not present

## 2014-08-30 DIAGNOSIS — I7092 Chronic total occlusion of artery of the extremities: Secondary | ICD-10-CM | POA: Diagnosis not present

## 2014-08-30 DIAGNOSIS — E1140 Type 2 diabetes mellitus with diabetic neuropathy, unspecified: Secondary | ICD-10-CM | POA: Diagnosis not present

## 2014-08-30 HISTORY — PX: PERIPHERAL VASCULAR CATHETERIZATION: SHX172C

## 2014-08-30 LAB — GLUCOSE, CAPILLARY
Glucose-Capillary: 95 mg/dL (ref 65–99)
Glucose-Capillary: 77 mg/dL (ref 65–99)
Glucose-Capillary: 187 mg/dL — ABNORMAL HIGH (ref 65–99)

## 2014-08-30 LAB — CBC
HCT: 35.4 % — ABNORMAL LOW (ref 39.0–52.0)
Hemoglobin: 11.7 g/dL — ABNORMAL LOW (ref 13.0–17.0)
MCH: 30.8 pg (ref 26.0–34.0)
MCHC: 33.1 g/dL (ref 30.0–36.0)
MCV: 93.2 fL (ref 78.0–100.0)
Platelets: 249 10*3/uL (ref 150–400)
RBC: 3.8 MIL/uL — ABNORMAL LOW (ref 4.22–5.81)
RDW: 13.8 % (ref 11.5–15.5)
WBC: 5.4 10*3/uL (ref 4.0–10.5)

## 2014-08-30 LAB — LIPID PANEL
Cholesterol: 177 mg/dL (ref 0–200)
HDL: 30 mg/dL — ABNORMAL LOW (ref >40)
LDL Cholesterol: 119 mg/dL — ABNORMAL HIGH (ref 0–99)
Total CHOL/HDL Ratio: 5.9 RATIO
Triglycerides: 138 mg/dL (ref <150)
VLDL: 28 mg/dL (ref 0–40)

## 2014-08-30 LAB — POCT ACTIVATED CLOTTING TIME
Activated Clotting Time: 208 seconds
Activated Clotting Time: 233 seconds
Activated Clotting Time: 245 seconds
Activated Clotting Time: 257 seconds
Activated Clotting Time: 178 seconds

## 2014-08-30 LAB — BASIC METABOLIC PANEL
Anion gap: 6 (ref 5–15)
BUN: 19 mg/dL (ref 6–20)
CO2: 24 mmol/L (ref 22–32)
Calcium: 8.5 mg/dL — ABNORMAL LOW (ref 8.9–10.3)
Chloride: 109 mmol/L (ref 101–111)
Creatinine, Ser: 1.24 mg/dL (ref 0.61–1.24)
GFR calc Af Amer: >60 mL/min (ref >60)
GFR calc non Af Amer: 58 mL/min — ABNORMAL LOW (ref >60)
Glucose, Bld: 114 mg/dL — ABNORMAL HIGH (ref 65–99)
Potassium: 4 mmol/L (ref 3.5–5.1)
Sodium: 139 mmol/L (ref 135–145)

## 2014-08-30 LAB — TSH: TSH: 1.046 u[IU]/mL (ref 0.350–4.500)

## 2014-08-30 LAB — MAGNESIUM: Magnesium: 2 mg/dL (ref 1.7–2.4)

## 2014-08-30 LAB — PROTIME-INR
INR: 1.05 (ref 0.00–1.49)
Prothrombin Time: 13.9 seconds (ref 11.6–15.2)

## 2014-08-30 SURGERY — LOWER EXTREMITY ANGIOGRAPHY

## 2014-08-30 MED ORDER — HEPARIN SODIUM (PORCINE) 1000 UNIT/ML IJ SOLN
INTRAMUSCULAR | Status: AC
  Filled 2014-08-30: qty 1

## 2014-08-30 MED ORDER — HEPARIN (PORCINE) IN NACL 2-0.9 UNIT/ML-% IJ SOLN
INTRAMUSCULAR | Status: AC
  Filled 2014-08-30: qty 1000

## 2014-08-30 MED ORDER — HEPARIN SODIUM (PORCINE) 1000 UNIT/ML IJ SOLN
INTRAMUSCULAR | Status: DC | PRN
  Administered 2014-08-30: 5000 [IU] via INTRAVENOUS
  Administered 2014-08-30: 3000 [IU] via INTRAVENOUS

## 2014-08-30 MED ORDER — CLOPIDOGREL BISULFATE 300 MG PO TABS
ORAL_TABLET | ORAL | Status: DC | PRN
  Administered 2014-08-30: 300 mg via ORAL

## 2014-08-30 MED ORDER — HYDRALAZINE HCL 20 MG/ML IJ SOLN
INTRAMUSCULAR | Status: DC | PRN
  Administered 2014-08-30: 10 mg via INTRAVENOUS

## 2014-08-30 MED ORDER — ONDANSETRON HCL 4 MG/2ML IJ SOLN: 4.0000 mg | Freq: Four times a day (QID) | INTRAMUSCULAR | Status: DC | PRN

## 2014-08-30 MED ORDER — HEPARIN SODIUM (PORCINE) 1000 UNIT/ML IJ SOLN
INTRAMUSCULAR | Status: DC | PRN
Start: 2014-08-30 — End: 2014-08-30
  Administered 2014-08-30: 2500 [IU] via INTRAVENOUS

## 2014-08-30 MED ORDER — ACETAMINOPHEN 325 MG PO TABS: 650.0000 mg | ORAL_TABLET | ORAL | Status: DC | PRN

## 2014-08-30 MED ORDER — LIDOCAINE HCL (PF) 1 % IJ SOLN
INTRAMUSCULAR | Status: AC
  Filled 2014-08-30: qty 30

## 2014-08-30 MED ORDER — PNEUMOCOCCAL VAC POLYVALENT 25 MCG/0.5ML IJ INJ
0.5000 mL | INJECTION | INTRAMUSCULAR | Status: AC
  Administered 2014-08-31: 11:00:00 0.5 mL via INTRAMUSCULAR
  Filled 2014-08-30: qty 0.5

## 2014-08-30 MED ORDER — MIDAZOLAM HCL 2 MG/2ML IJ SOLN
INTRAMUSCULAR | Status: DC | PRN
  Administered 2014-08-30: 1 mg via INTRAVENOUS

## 2014-08-30 MED ORDER — SODIUM CHLORIDE 0.9 % IV SOLN
INTRAVENOUS | Status: AC
Start: 2014-08-30 — End: 2014-08-31
  Administered 2014-08-30: 11:00:00 via INTRAVENOUS

## 2014-08-30 MED ORDER — FENTANYL CITRATE (PF) 100 MCG/2ML IJ SOLN
INTRAMUSCULAR | Status: DC | PRN
  Administered 2014-08-30: 25 ug via INTRAVENOUS

## 2014-08-30 MED ORDER — MIDAZOLAM HCL 2 MG/2ML IJ SOLN: INTRAMUSCULAR | Status: DC | PRN

## 2014-08-30 MED ORDER — LIDOCAINE HCL (PF) 1 % IJ SOLN
INTRAMUSCULAR | Status: DC | PRN
  Administered 2014-08-30: 30 mL

## 2014-08-30 MED ORDER — CLOPIDOGREL BISULFATE 75 MG PO TABS
75.0000 mg | ORAL_TABLET | Freq: Every day | ORAL | Status: DC
  Administered 2014-08-31: 75 mg via ORAL
  Filled 2014-08-30: qty 1

## 2014-08-30 MED ORDER — IODIXANOL 320 MG/ML IV SOLN
INTRAVENOUS | Status: DC | PRN
  Administered 2014-08-30: 233 mL via INTRA_ARTERIAL

## 2014-08-30 MED ORDER — FENTANYL CITRATE (PF) 100 MCG/2ML IJ SOLN
INTRAMUSCULAR | Status: AC
  Filled 2014-08-30: qty 2

## 2014-08-30 MED ORDER — ASPIRIN EC 325 MG PO TBEC
325.0000 mg | DELAYED_RELEASE_TABLET | Freq: Every day | ORAL | Status: DC
  Administered 2014-08-30 – 2014-08-31 (×2): 325 mg via ORAL
  Filled 2014-08-30 (×2): qty 1

## 2014-08-30 MED ORDER — MORPHINE SULFATE 2 MG/ML IJ SOLN: 2.0000 mg | INTRAMUSCULAR | Status: DC | PRN

## 2014-08-30 MED ORDER — HYDRALAZINE HCL 20 MG/ML IJ SOLN
INTRAMUSCULAR | Status: AC
  Filled 2014-08-30: qty 1

## 2014-08-30 MED ORDER — CLOPIDOGREL BISULFATE 300 MG PO TABS
ORAL_TABLET | ORAL | Status: AC
  Filled 2014-08-30: qty 1

## 2014-08-30 MED ORDER — HEPARIN SODIUM (PORCINE) 1000 UNIT/ML IJ SOLN
INTRAMUSCULAR | Status: AC
Start: 2014-08-30 — End: 2014-08-30
  Filled 2014-08-30: qty 1

## 2014-08-30 MED ORDER — MIDAZOLAM HCL 2 MG/2ML IJ SOLN
INTRAMUSCULAR | Status: AC
  Filled 2014-08-30: qty 2

## 2014-08-30 SURGICAL SUPPLY — 28 items
BALLN ARMADA 2.5X200X150 (CATHETERS) ×4
BALLN LUTONIX 6X120X130 (BALLOONS) ×8
BALLN LUTONIX DCB 6X100X130 (BALLOONS) ×8
BALLOON ARMADA 2.5X200X150 (CATHETERS) IMPLANT
BALLOON LUTONIX 6X120X130 (BALLOONS) IMPLANT
BALLOON LUTONIX DCB 6X100X130 (BALLOONS) IMPLANT
CATH ANGIO 5F PIGTAIL 65CM (CATHETERS) ×2 IMPLANT
CATH CROSS OVER TEMPO 5F (CATHETERS) ×2 IMPLANT
CATH HAWKONE LX EXTENDED TIP (CATHETERS) ×2 IMPLANT
CATH VIANCE CROSS STAND 150CM (MICROCATHETER) ×4
CATH VIANCE CROSS STD 150CM (MICROCATHETER) IMPLANT
DEVICE CONTINUOUS FLUSH (MISCELLANEOUS) ×2 IMPLANT
DEVICE SPIDERFX EMB PROT 5MM (WIRE) ×2 IMPLANT
GUIDEWIRE ASTATO XS 20G 300CM (WIRE) ×2 IMPLANT
HAND CONTROLLER AVANTA (MISCELLANEOUS) ×2 IMPLANT
KIT ENCORE 26 ADVANTAGE (KITS) ×2 IMPLANT
KIT PV (KITS) ×4 IMPLANT
NITREX .014 300CM (WIRE) ×2 IMPLANT
SET AVANTA SINGLE PATIENT (MISCELLANEOUS) ×2 IMPLANT
SHEATH AVANTA HAND CONTROLLER (MISCELLANEOUS) ×2 IMPLANT
SHEATH HIGHFLEX ANSEL 7FR 55CM (SHEATH) ×2 IMPLANT
SHEATH PINNACLE 5F 10CM (SHEATH) ×2 IMPLANT
SHEATH PINNACLE 7F 10CM (SHEATH) ×2 IMPLANT
TAPE RADIOPAQUE TURBO (MISCELLANEOUS) ×2 IMPLANT
TRANSDUCER W/STOPCOCK (MISCELLANEOUS) ×4 IMPLANT
TRAY PV CATH (CUSTOM PROCEDURE TRAY) ×4 IMPLANT
TUBING CIL FLEX 10 FLL-RA (TUBING) ×2 IMPLANT
WIRE HITORQ VERSACORE ST 145CM (WIRE) ×2 IMPLANT
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
## 2014-08-30 NOTE — Interval H&P Note (Signed)
History and Physical Interval Note:  08/30/2014 7:46 AM  Harry [name]  has presented today for surgery, with the diagnosis of pad  The various methods of treatment have been discussed with the patient and family. After consideration of risks, benefits and other options for treatment, the patient has consented to  Procedure(s): Lower Extremity Angiography (N/A) as a surgical intervention .  The patient's history has been reviewed, patient examined, no change in status, stable for surgery.  I have reviewed the patient's chart and labs.  Questions were answered to the patient's satisfaction.     Quay Burow

## 2014-08-31 ENCOUNTER — Encounter (HOSPITAL_COMMUNITY): Payer: Self-pay | Admitting: Cardiovascular Disease

## 2014-08-31 ENCOUNTER — Other Ambulatory Visit: Payer: Self-pay | Admitting: Physician Assistant

## 2014-08-31 DIAGNOSIS — Z794 Long term (current) use of insulin: Secondary | ICD-10-CM | POA: Diagnosis not present

## 2014-08-31 DIAGNOSIS — F17210 Nicotine dependence, cigarettes, uncomplicated: Secondary | ICD-10-CM | POA: Diagnosis present

## 2014-08-31 DIAGNOSIS — I7092 Chronic total occlusion of artery of the extremities: Secondary | ICD-10-CM | POA: Diagnosis present

## 2014-08-31 DIAGNOSIS — E785 Hyperlipidemia, unspecified: Secondary | ICD-10-CM

## 2014-08-31 DIAGNOSIS — I10 Essential (primary) hypertension: Secondary | ICD-10-CM | POA: Diagnosis not present

## 2014-08-31 DIAGNOSIS — Z79899 Other long term (current) drug therapy: Secondary | ICD-10-CM | POA: Diagnosis not present

## 2014-08-31 DIAGNOSIS — E1140 Type 2 diabetes mellitus with diabetic neuropathy, unspecified: Secondary | ICD-10-CM | POA: Diagnosis present

## 2014-08-31 DIAGNOSIS — I70213 Atherosclerosis of native arteries of extremities with intermittent claudication, bilateral legs: Secondary | ICD-10-CM | POA: Diagnosis present

## 2014-08-31 DIAGNOSIS — N183 Chronic kidney disease, stage 3 (moderate): Secondary | ICD-10-CM | POA: Diagnosis present

## 2014-08-31 DIAGNOSIS — I129 Hypertensive chronic kidney disease with stage 1 through stage 4 chronic kidney disease, or unspecified chronic kidney disease: Secondary | ICD-10-CM | POA: Diagnosis present

## 2014-08-31 DIAGNOSIS — I739 Peripheral vascular disease, unspecified: Secondary | ICD-10-CM

## 2014-08-31 DIAGNOSIS — Z8673 Personal history of transient ischemic attack (TIA), and cerebral infarction without residual deficits: Secondary | ICD-10-CM | POA: Diagnosis not present

## 2014-08-31 DIAGNOSIS — I998 Other disorder of circulatory system: Secondary | ICD-10-CM | POA: Diagnosis not present

## 2014-08-31 LAB — BASIC METABOLIC PANEL
Anion gap: 5 (ref 5–15)
BUN: 19 mg/dL (ref 6–20)
CO2: 23 mmol/L (ref 22–32)
Calcium: 8 mg/dL — ABNORMAL LOW (ref 8.9–10.3)
Chloride: 109 mmol/L (ref 101–111)
Creatinine, Ser: 1.2 mg/dL (ref 0.61–1.24)
GFR calc Af Amer: >60 mL/min (ref >60)
GFR calc non Af Amer: >60 mL/min (ref >60)
Glucose, Bld: 151 mg/dL — ABNORMAL HIGH (ref 65–99)
Potassium: 3.9 mmol/L (ref 3.5–5.1)
Sodium: 137 mmol/L (ref 135–145)

## 2014-08-31 LAB — HEMOGLOBIN A1C
Hgb A1c MFr Bld: 7.5 % — ABNORMAL HIGH (ref 4.8–5.6)
Mean Plasma Glucose: 169 mg/dL

## 2014-08-31 LAB — CBC
HCT: 32.3 % — ABNORMAL LOW (ref 39.0–52.0)
Hemoglobin: 11 g/dL — ABNORMAL LOW (ref 13.0–17.0)
MCH: 31.9 pg (ref 26.0–34.0)
MCHC: 34.1 g/dL (ref 30.0–36.0)
MCV: 93.6 fL (ref 78.0–100.0)
Platelets: 226 10*3/uL (ref 150–400)
RBC: 3.45 MIL/uL — ABNORMAL LOW (ref 4.22–5.81)
RDW: 14 % (ref 11.5–15.5)
WBC: 6 10*3/uL (ref 4.0–10.5)

## 2014-08-31 LAB — GLUCOSE, CAPILLARY: Glucose-Capillary: 148 mg/dL — ABNORMAL HIGH (ref 65–99)

## 2014-08-31 MED ORDER — CLOPIDOGREL BISULFATE 75 MG PO TABS
75.0000 mg | ORAL_TABLET | Freq: Every day | ORAL | Status: DC
Start: 2014-08-31 — End: 2015-09-22

## 2014-08-31 MED ORDER — HYDRALAZINE HCL 20 MG/ML IJ SOLN
10.0000 mg | Freq: Once | INTRAMUSCULAR | Status: AC
  Administered 2014-08-31: 02:00:00 10 mg via INTRAVENOUS
  Filled 2014-08-31: qty 1

## 2014-08-31 MED ORDER — ASPIRIN 325 MG PO TBEC: 325.0000 mg | DELAYED_RELEASE_TABLET | Freq: Every day | ORAL | Status: DC

## 2014-08-31 MED FILL — Heparin Sodium (Porcine) 2 Unit/ML in Sodium Chloride 0.9%: INTRAMUSCULAR | Qty: 1000 | Status: AC

## 2014-08-31 NOTE — Discharge Summary (Signed)
Physician Discharge Summary     Cardiologist: Gwenlyn Found  Patient ID: Harry [name] MRN: [id number] DOB/AGE: 1945/11/20 69 y.o.  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 He was referred by Dr. Barkley Bruns, his podiatrist, for evaluation of critical limb ischemia. His primary care physician is Dr. Legrand [facility]. He is a retired Administrator for [employer]. His cardiovascular risk factor profile is notable for 50-pack-years of tobacco abuse currently smoking one pack per day and recalcitrant risk factor modification, treated diabetes, hypertension and hyperlipidemia. 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 He denies chest pain or shortness of breath. He does have hip buttock and thigh heaviness with ambulation which may or may not be claudication as well as a healing right second toe ulcer probably related to diabetic nephropathy and physical trauma. Since I saw him 2 months ago his right second toe ulcer has healed. Dopplers performed in our office 07/09/14 revealed ABIs in the 0.7 range with occluded SFAs bilaterally and tibial disease on the right. He presents today for angiography and potential percutaneous intervention of the left SFA for lifestyle limiting claudication  The patient was admitted for PV angiography which revealed the renal arteries widely patent. The  infrarenal abdominal aorta had mild atherosclerotic changes.  Left lower extremity-the left SFA was occluded from its origin down to the above-the-knee popliteal artery. This reconstituted by profunda femoris collaterals. There were was two-vessel runoff with occluded anterior tibial.  Right lower extremity-there was a fairly long 75-80% segmental proximal right SFA stenosis followed by a chronic total occlusion in the midportion reconstituting in the adductor canal two-vessel runoff. The anterior tibial was occluded.   He underwent successful Hawk 1 directional atherectomy, drug eluting balloon for left SFA chronic total occlusion using distal protection.  BP has been labile.  Hydralazine was used for elevations.  SCr. Stable.  Cozaar resume at discharge.  ASA, plavix.  The patient was seen by Dr. [place] who felt he was stable for DC home.  LEA doppler ordered.     Consults: None  Significant Diagnostic Studies:   Procedures Performed: 1. Abdominal aortogram, bilateral iliac angiogram, bifemoral runoff 2. Placement of spider distal protection device in the below-the-knee popliteal artery 3. Hawk 1 directional atherectomy of the entire left SFA 4. Drug-eluting balloon and plasty of the entire left SFA  PROCEDURE DESCRIPTION:   The patient was brought to the second floor [facility] lab in the the postabsorptive state. He was premedicated with Valium 5 mg by mouth, IV Versed and fentanyl. His right groin was prepped and shaved in usual sterile fashion. Xylocaine 1% was used for local anesthesia. A 5 French sheath was inserted into the right common femoral artery using standard Seldinger technique. A 5 French pigtail catheter was placed in the midstream abdominal aorta. Abdominal aortography, bilateral iliac angiography and bifemoral runoff were performed  using bolus chase digital subtraction step table technique. Visipaque dye was used for  the entirety of the case. Retrograde aortic pressure was monitored during the case.    Angiographic Data:   1: Abdominal aortogram-the renal arteries widely patent. The infrarenal abdominal aorta had mild atherosclerotic changes  2: Left lower extremity-the left SFA was occluded from its origin down to the above-the-knee popliteal artery. This reconstituted by profunda femoris collaterals. There were was two-vessel runoff with occluded anterior tibial.  3: Right lower extremity-there was a fairly long 75-80% segmental proximal right SFA stenosis followed by a chronic total occlusion in the midportion reconstituting in the adductor canal two-vessel runoff. The anterior tibial was occluded.  IMPRESSION:bilateral total SFAs left greater than right with lifestyle limited claudication. Will proceed with percutaneous revascularize rotation of the left SFA using directional atherectomy followed by drug-eluting balloon and plasty with distal protection.  Procedure Description:contralateral access obtained with a crossover catheter, 0.35 per Versicore wire and 7 [place] multipurpose that the 5 cm Ansel sheath. The patient received 10,500 units of heparin intravenously with an ACT of 257. Total contrast administered the patient was 233 mL. He received Plavix 300 mg by mouth at the end of the case. I was able to cross the long chronic total occlusion with the Viance CTO catheter along with an 0. 14 Astato CTO wire. After confirming that I was intraluminal in the popliteal artery I exchanged the CTO wire for a 014 Nitrex wire and removed the Viance catheter. Following this I placed a 5 mm spider distal protection device in the blood and the popliteal artery. I then performed directional atherectomy with a hawk 1 device performing multiple circumferential cuts in the proximal mid and distal left SFA removing a copious amount of atherosclerotic plaque. I then performed drug eluting balloon angioplasty over the  entirety of the treated segments with 4 drug eluting balloons (2, 6 x 120, 2, 6 x 100). I then retrieved the distal protection device and performed completion angiography revealing a widely patent left SFA with a 20-30% proximal residual stenosis, no dissections with brisk runoff via 2 tibial vessels. The patient the procedure well. The sheath was then withdrawn up across the bifurcation and exchanged over the same O35 wire for a short 7 [place] sheath. He left the room in stable condition. Plans will be for hydration overnight. The sheath will be removed once the ACT falls below 170 and pressure will be held. He'll be discharged home tomorrow on dual antiplatelets therapy..  Final Impression: successful Hawk 1 directional atherectomy, drug eluting balloon adiposity for left SFA chronic total occlusion using distal protection. The patient had excellent angiographic result. He'll be hydrated aggressively overnight with blood work checked in the morning. He will need to be started back on his ARB for blood pressure control and a basic metabolic panel checked reports the end of the week. We will get lower extremity arterial Doppler studies on his left lower extremity in the [facility] office next week and I will see him back in follow-up 2-3 weeks thereafter. If he has a favorable clinical response we will consider performing staged intervention on his occluded right SFA.    Quay Burow. MD, [facility] 08/30/2014  Treatments: See above  Discharge Exam: Blood pressure 107/52, pulse 92, temperature 97.4 F (36.3 C), temperature source Oral, resp. rate 19, weight 194 lb 7.1 oz (88.2 kg), SpO2 96 %.   Disposition: 01-Home or Self Care  Discharge Instructions    Diet - low sodium  heart healthy    Complete by:  As directed      Discharge instructions    Complete by:  As directed   No lifting more than a half gallon of milk or driving for three days.     Increase activity slowly    Complete by:  As directed              Medication List    TAKE these medications        aspirin 325 MG EC tablet  Take 1 tablet (325 mg total) by mouth daily.     brimonidine 0.2 % ophthalmic solution  Commonly known as:  ALPHAGAN  Apply 1 drop to eye 2 (two) times daily.     clopidogrel 75 MG tablet  Commonly known as:  PLAVIX  Take 1 tablet (75 mg total) by mouth daily with breakfast.     dorzolamide-timolol 22.3-6.8 MG/ML ophthalmic solution  Commonly known as:  COSOPT  Apply 1 drop to eye 2 (two) times daily.     FREESTYLE LITE Devi  1 each by Does not apply route 2 (two) times daily.     gabapentin 300 MG capsule  Commonly known as:  NEURONTIN  Take 300 mg by mouth 3 (three) times daily.     ibuprofen 200 MG tablet  Commonly known as:  ADVIL,MOTRIN  Take 1 tablet by mouth every 6 (six) hours as needed for moderate pain.     insulin glargine 100 UNIT/ML injection  Commonly known as:  LANTUS  Inject 20 Units into the skin 2 (two) times daily.     losartan 50 MG tablet  Commonly known as:  COZAAR  Take 50 mg by mouth daily.           Follow-up Information    Follow up with Quay Burow, MD On 09/14/2014.   Specialties:  Cardiology, Radiology   Why:  9:15 AM    Contact information:   [address] [place] [address] [phone]      Greater than 30 minutes was spent completing the patient's discharge.   SignedTarri Fuller, PA-C 08/31/2014, 8:34 AM

## 2014-08-31 NOTE — Progress Notes (Signed)
Subjective: No Complaints  Objective: Vital signs in last 24 hours: Temp:  [97.3 F (36.3 C)-98 F (36.7 C)] 97.4 F (36.3 C) (06/28 0743) Pulse Rate:  [0-92] 92 (06/28 0743) Resp:  [0-20] 19 (06/28 0743) BP: (107-201)/(52-102) 107/52 mmHg (06/28 0743) SpO2:  [0 %-100 %] 96 % (06/28 0743) Weight:  [194 lb 7.1 oz (88.2 kg)] 194 lb 7.1 oz (88.2 kg) (06/28 0009) Last BM Date: 08/29/14  Intake/Output from previous day: 06/27 0701 - 06/28 0700 In: 1357.5 [P.O.:720; I.V.:637.5] Out: 1000 [Urine:1000] Intake/Output this shift: Total I/O In: 240 [P.O.:240] Out: -   Medications Scheduled Meds: . aspirin EC  325 mg Oral Daily  . brimonidine  1 drop Left Eye BID  . clopidogrel  75 mg Oral Q breakfast  . dorzolamide-timolol  1 drop Right Eye BID  . famotidine  20 mg Oral BID  . gabapentin  300 mg Oral TID  . insulin aspart  0-5 Units Subcutaneous QHS  . insulin aspart  0-9 Units Subcutaneous TID WC  . insulin glargine  10 Units Subcutaneous BID  . nicotine  14 mg Transdermal Daily  . pneumococcal 23 valent vaccine  0.5 mL Intramuscular Tomorrow-1000   Continuous Infusions: . sodium chloride 1 mL/kg/hr (08/30/14 0147)   PRN Meds:.acetaminophen **OR** acetaminophen, acetaminophen, alum & mag hydroxide-simeth, morphine injection, ondansetron **OR** ondansetron (ZOFRAN) IV, ondansetron (ZOFRAN) IV, polyethylene glycol, zolpidem  PE: General appearance: alert, cooperative and no distress Lungs: +L> R rhonchi Heart: regular rate and rhythm, S1, S2 normal, no murmur, click, rub or gallop Extremities: No LEE Pulses: 2+ radials. 2+ right DP, 1+ right PT.  No palpable left DP/PT. Foot warm. Skin: Warm and dry.  Right groin stable.  Nontender, minimal hematoma Neurologic: Grossly normal  Lab Results:   Recent Labs  08/29/14 1255 08/30/14 0345 08/31/14 0514  WBC 5.8 5.4 6.0  HGB 12.6* 11.7* 11.0*  HCT 37.7* 35.4* 32.3*  PLT 264 249 226   BMET  Recent Labs   08/29/14 1255 08/30/14 0345 08/31/14 0514  NA  --  139 137  K  --  4.0 3.9  CL  --  109 109  CO2  --  24 23  GLUCOSE  --  114* 151*  BUN  --  19 19  CREATININE 1.57* 1.24 1.20  CALCIUM  --  8.5* 8.0*   PT/INR  Recent Labs  08/30/14 0345  LABPROT 13.9  INR 1.05   Cholesterol  Recent Labs  08/30/14 0345  CHOL 177   Lipid Panel     Component Value Date/Time   CHOL 177 08/30/2014 0345   TRIG 138 08/30/2014 0345   TRIG 144 12/14/2005 1558   HDL 30* 08/30/2014 0345   CHOLHDL 5.9 08/30/2014 0345   CHOLHDL 6.3 CALC 12/14/2005 1558   VLDL 28 08/30/2014 0345   LDLCALC 119* 08/30/2014 0345    Assessment/Plan  Principal Problem:   Critical lower limb ischemia Active Problems:   DM2 (diabetes mellitus, type 2)   Hyperlipidemia LDL goal <70   TOBACCO USE   Essential hypertension   PAD (peripheral artery disease)  SP PV angiogram and successful Hawk 1 directional atherectomy, drug eluting balloon for left SFA chronic total occlusion using distal protection. The patient had excellent angiographic result.   ASA, plavix.  SCr WNL.  BP controlled.  LEA dopplers in a week and FU with Dr. Gwenlyn Found in 2-3. Tobacco cessation discussed.     LOS: 2 days    HAGER, BRYAN PA-C  08/31/2014 8:08 AM  Patient seen and examined and history reviewed. Agree with above findings and plan. Doing well. No complaints. Femoral access site without hematoma. BP spiked last night but better now. Will resume losartan. OK for DC today with above follow up. Continue ASA and Plavix.  Harry [name], [place] 08/31/2014 8:28 AM

## 2014-08-31 NOTE — Progress Notes (Signed)
Dr.J.Kelly notified for elevated SBP 170-200 IV hydralizine ordered will continue to monitor. Pt asymptomatic.

## 2014-09-14 ENCOUNTER — Encounter: Payer: Self-pay | Admitting: Cardiovascular Disease

## 2014-09-14 ENCOUNTER — Ambulatory Visit (INDEPENDENT_AMBULATORY_CARE_PROVIDER_SITE_OTHER): Payer: Medicare Other | Admitting: Cardiovascular Disease

## 2014-09-14 DIAGNOSIS — Z79899 Other long term (current) drug therapy: Secondary | ICD-10-CM | POA: Diagnosis not present

## 2014-09-14 DIAGNOSIS — I739 Peripheral vascular disease, unspecified: Secondary | ICD-10-CM

## 2014-09-14 DIAGNOSIS — D689 Coagulation defect, unspecified: Secondary | ICD-10-CM | POA: Diagnosis not present

## 2014-09-14 DIAGNOSIS — I10 Essential (primary) hypertension: Secondary | ICD-10-CM

## 2014-09-14 MED ORDER — ATORVASTATIN CALCIUM 20 MG PO TABS: 20.0000 mg | ORAL_TABLET | Freq: Every day | ORAL | Status: DC

## 2014-09-14 NOTE — Patient Instructions (Addendum)
Dr. Gwenlyn Found has ordered a peripheral angiogram to [facility].  This procedure is going to look at the bloodflow in your lower extremities.  If Dr. Gwenlyn Found is able to open up the arteries, you will have to spend one night in the hospital.  If he is not able to open the arteries, you will be able to go home that same day.    After the procedure, you will not be allowed to drive for 3 days or push, pull, or lift anything greater than 10 lbs for one week.    You will be required to have the following tests prior to the procedure:  1. Blood work-the blood work can be done no more than 7 days prior to the procedure.  It can be done at any [facility] lab.  There is one downstairs on the first floor of this building and one in the [facility] building [phone] [address])  *REPS: Scott   Start Atorvastatin 20mg  daily for your cholesterol levels.

## 2014-09-14 NOTE — Assessment & Plan Note (Signed)
Story of hyperlipidemia currently not on a statin drug. His most recent lipid profile performed 08/30/14 revealed an LDL of 177 with an HDL of 119. We will start atorvastatin 20 mg a day and recheck

## 2014-09-14 NOTE — Assessment & Plan Note (Signed)
History of hypotension with blood pressure measured at 130/52. He is on losartan K continue current meds at current dosing

## 2014-09-14 NOTE — Assessment & Plan Note (Signed)
History of lifestyle limiting claudication with a 2 g performed 08/29/14 revealing occluded SFAs bilaterally with 2 vessel runoff. He is status post [facility]  1 directional atherectomy, drug eluting angioplasty of the left SFA with excellent angiographic and clinical result. The patient wishes to proceed with staged right SFA intervention which we will plan for next week. He is on dual antiplatelet therapy.

## 2014-09-14 NOTE — Progress Notes (Signed)
09/14/2014 [name]   Jan 18, 1946  [id number]  Primary Physician [name], MD Primary Cardiologist: Lorretta Harp MD Harry [name]   HPI: Harry [name] is a 69 year old married African-American male father of 3 children, grandfather of 3 grandchildren who is accompanied by his wife Harry [name], and his daughter today who is  a Marine scientist. I last saw him in the office 08/25/14.He was referred by Dr. Barkley Bruns, his podiatrist, for evaluation of critical limb ischemia. His primary care physician is Dr. Legrand Rams and [facility]. He is a retired Administrator for [employer]. His cardiovascular risk factor profile is notable for 50-pack-years of tobacco abuse currently smoking one pack per day and recalcitrant risk factor modification, treated diabetes, hypertension and hyperlipidemia. 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 He denies chest pain or shortness of breath. He does have hip buttock and thigh heaviness with ambulation which may or may not be claudication as well as a healing right second toe ulcer probably related to diabetic nephropathy and physical trauma. Since I saw him 2 months ago his right second toe ulcer has healed. Dopplers performed in our office 07/09/14 revealed ABIs in the 0.7 range with occluded SFAs bilaterally and tibial disease on the right. I performed peripheral angiography on him 08/29/14 revealing occluded SFAs bilaterally. I ultimately performed [facility] one direction atherectomy, drug eluding balloon angioplasty on the left SFA with excellent antegrade and clinical result. The patient returns for follow-up. His left leg is quickly improved and he wishes to pursue staged right SFA intervention.  Current Outpatient Prescriptions  Medication Sig Dispense Refill  . aspirin EC 325 MG EC tablet Take 1 tablet (325 mg total) by mouth daily. 30 tablet 0  . Blood Glucose  Monitoring Suppl (FREESTYLE LITE) DEVI 1 each by Does not apply route 2 (two) times daily. 100 each 1  . brimonidine (ALPHAGAN) 0.2 % ophthalmic solution Apply 1 drop to eye 2 (two) times daily.    . clopidogrel (PLAVIX) 75 MG tablet Take 1 tablet (75 mg total) by mouth daily with breakfast. 30 tablet 11  . dorzolamide-timolol (COSOPT) 22.3-6.8 MG/ML ophthalmic solution Apply 1 drop to eye 2 (two) times daily.    [name] Kitchen gabapentin (NEURONTIN) 300 MG capsule Take 300 mg by mouth 3 (three) times daily.  1  . ibuprofen (ADVIL,MOTRIN) 200 MG tablet Take 1 tablet by mouth every 6 (six) hours as needed for moderate pain.     [name] Kitchen insulin glargine (LANTUS) 100 UNIT/ML injection Inject 20 Units into the skin 2 (two) times daily.    [name] Kitchen losartan (COZAAR) 50 MG tablet Take 50 mg by mouth daily.     . ranitidine (ZANTAC) 150 MG tablet Take 150 mg by mouth daily.    [name] Kitchen atorvastatin (LIPITOR) 20 MG tablet Take 1 tablet (20 mg total) by mouth daily. 30 tablet 6   No current facility-administered medications for this visit.    No Known Allergies  History   Social History  . Marital Status: Married    Spouse Name: N/A  . Number of Children: N/A  . Years of Education: N/A   Occupational History  . Not on file.   Social History Main Topics  . Smoking status: Current Every Day Smoker -- 0.50 packs/day    Types: Cigarettes  . Smokeless tobacco: Never Used  . Alcohol Use: No  . Drug Use: No  .  Sexual Activity: Not on file   Other Topics Concern  . Not on file   Social History Narrative     Review of Systems: General: negative for chills, fever, night sweats or weight changes.  Cardiovascular: negative for chest pain, dyspnea on exertion, edema, orthopnea, palpitations, paroxysmal nocturnal dyspnea or shortness of breath Dermatological: negative for rash Respiratory: negative for cough or wheezing Urologic: negative for hematuria Abdominal: negative for nausea, vomiting, diarrhea, bright red blood per  rectum, melena, or hematemesis Neurologic: negative for visual changes, syncope, or dizziness All other systems reviewed and are otherwise negative except as noted above.    Blood pressure 130/52, pulse 88, height 6' (1.829 m), weight 198 lb (89.812 kg).  General appearance: alert and no distress Neck: no adenopathy, no carotid bruit, no JVD, supple, symmetrical, trachea midline and thyroid not enlarged, symmetric, no tenderness/mass/nodules Lungs: clear to auscultation bilaterally Heart: regular rate and rhythm, S1, S2 normal, no murmur, click, rub or gallop Extremities: extremities normal, atraumatic, no cyanosis or edema  EKG not performed today  ASSESSMENT AND PLAN:   TOBACCO USE History of tobacco abuse continuing to smoke somewhat recalcitrant to risk factor modification though receptive to stopping  PAD (peripheral artery disease) History of lifestyle limiting claudication with a 2 g performed 08/29/14 revealing occluded SFAs bilaterally with 2 vessel runoff. He is status post [facility]  1 directional atherectomy, drug eluting angioplasty of the left SFA with excellent angiographic and clinical result. The patient wishes to proceed with staged right SFA intervention which we will plan for next week. He is on dual antiplatelet therapy.  Hyperlipidemia LDL goal <70 Story of hyperlipidemia currently not on a statin drug. His most recent lipid profile performed 08/30/14 revealed an LDL of 177 with an HDL of 119. We will start atorvastatin 20 mg a day and recheck  Essential hypertension History of hypotension with blood pressure measured at 130/52. He is on losartan K continue current meds at current dosing      Lorretta Harp MD [facility], [facility] 09/14/2014 9:59 AM

## 2014-09-14 NOTE — Assessment & Plan Note (Signed)
History of tobacco abuse continuing to smoke somewhat recalcitrant to risk factor modification though receptive to stopping

## 2014-09-16 ENCOUNTER — Telehealth: Payer: Self-pay | Admitting: Cardiovascular Disease

## 2014-09-16 LAB — PROTIME-INR
INR: 1.06 (ref <1.50)
Prothrombin Time: 13.8 seconds (ref 11.6–15.2)

## 2014-09-16 LAB — CBC
HCT: 34.3 % — ABNORMAL LOW (ref 39.0–52.0)
Hemoglobin: 11 g/dL — ABNORMAL LOW (ref 13.0–17.0)
MCH: 30.7 pg (ref 26.0–34.0)
MCHC: 32.1 g/dL (ref 30.0–36.0)
MCV: 95.8 fL (ref 78.0–100.0)
MPV: 8.9 fL (ref 8.6–12.4)
Platelets: 257 10*3/uL (ref 150–400)
RBC: 3.58 MIL/uL — ABNORMAL LOW (ref 4.22–5.81)
RDW: 14.4 % (ref 11.5–15.5)
WBC: 4.6 10*3/uL (ref 4.0–10.5)

## 2014-09-16 LAB — BASIC METABOLIC PANEL
BUN: 32 mg/dL — ABNORMAL HIGH (ref 6–23)
CO2: 23 mEq/L (ref 19–32)
Calcium: 9 mg/dL (ref 8.4–10.5)
Chloride: 107 mEq/L (ref 96–112)
Creat: 1.63 mg/dL — ABNORMAL HIGH (ref 0.50–1.35)
Glucose, Bld: 115 mg/dL — ABNORMAL HIGH (ref 70–99)
Potassium: 4.5 mEq/L (ref 3.5–5.3)
Sodium: 140 mEq/L (ref 135–145)

## 2014-09-16 LAB — APTT: aPTT: 31 seconds (ref 24–37)

## 2014-09-16 NOTE — Telephone Encounter (Signed)
RN will discuss with pharmacist - Erasmo Downer Will contact patient.

## 2014-09-16 NOTE — Telephone Encounter (Signed)
Mrs. Axline states her husband was here the week to see Dr. Gwenlyn Found and was placed on a new medication.  Since then, his blood sugars have been running high.  Could this be caused by the medication?[name] Kitchen

## 2014-09-16 NOTE — Telephone Encounter (Signed)
Spoke to patient  Informed him that it is a 5-6% chance taht statins will increase blood sugar per pharmacist.  would like for him to continue for about week to se if blod sugar will even out.  patient states blood sugar has been in the range 300 -400 range since starting medication  RN asked patient to contact doctor who follows diabetes  RN will contact patient after discusSing with [place]

## 2014-09-17 ENCOUNTER — Telehealth: Payer: Self-pay | Admitting: *Deleted

## 2014-09-17 NOTE — Telephone Encounter (Signed)
I spoke with patient.  He does not feel comfortable taking his statin at this point due to his increased blood sugar.  He will stop it and discuss with Dr Gwenlyn Found.

## 2014-09-17 NOTE — Telephone Encounter (Signed)
That’s fine with me

## 2014-09-17 NOTE — Telephone Encounter (Signed)
Made arrangements for patient to be admitted on Sunday, July 17th, for hydration for upcoming PV procedure. Bed control will contact patient at number [phone].

## 2014-09-19 ENCOUNTER — Ambulatory Visit (HOSPITAL_COMMUNITY)
Admission: RE | Admit: 2014-09-19 | Discharge: 2014-09-21 | Disposition: A | Discharge status: Home / Self Care | Payer: Medicare Other | Source: Ambulatory Visit | Attending: Cardiovascular Disease | Admitting: Cardiovascular Disease

## 2014-09-19 DIAGNOSIS — I70229 Atherosclerosis of native arteries of extremities with rest pain, unspecified extremity: Secondary | ICD-10-CM | POA: Diagnosis present

## 2014-09-19 DIAGNOSIS — F17210 Nicotine dependence, cigarettes, uncomplicated: Secondary | ICD-10-CM | POA: Insufficient documentation

## 2014-09-19 DIAGNOSIS — E785 Hyperlipidemia, unspecified: Secondary | ICD-10-CM | POA: Diagnosis present

## 2014-09-19 DIAGNOSIS — Z8673 Personal history of transient ischemic attack (TIA), and cerebral infarction without residual deficits: Secondary | ICD-10-CM | POA: Insufficient documentation

## 2014-09-19 DIAGNOSIS — I7092 Chronic total occlusion of artery of the extremities: Secondary | ICD-10-CM | POA: Diagnosis not present

## 2014-09-19 DIAGNOSIS — I70211 Atherosclerosis of native arteries of extremities with intermittent claudication, right leg: Secondary | ICD-10-CM | POA: Insufficient documentation

## 2014-09-19 DIAGNOSIS — I10 Essential (primary) hypertension: Secondary | ICD-10-CM | POA: Diagnosis present

## 2014-09-19 DIAGNOSIS — I739 Peripheral vascular disease, unspecified: Secondary | ICD-10-CM | POA: Diagnosis present

## 2014-09-19 DIAGNOSIS — F17200 Nicotine dependence, unspecified, uncomplicated: Secondary | ICD-10-CM | POA: Diagnosis present

## 2014-09-19 DIAGNOSIS — Z9862 Peripheral vascular angioplasty status: Secondary | ICD-10-CM

## 2014-09-19 DIAGNOSIS — Z7902 Long term (current) use of antithrombotics/antiplatelets: Secondary | ICD-10-CM | POA: Insufficient documentation

## 2014-09-19 DIAGNOSIS — E1169 Type 2 diabetes mellitus with other specified complication: Secondary | ICD-10-CM

## 2014-09-19 DIAGNOSIS — Z7982 Long term (current) use of aspirin: Secondary | ICD-10-CM | POA: Diagnosis not present

## 2014-09-19 DIAGNOSIS — N183 Chronic kidney disease, stage 3 (moderate): Secondary | ICD-10-CM

## 2014-09-19 DIAGNOSIS — E119 Type 2 diabetes mellitus without complications: Secondary | ICD-10-CM | POA: Diagnosis not present

## 2014-09-19 DIAGNOSIS — I998 Other disorder of circulatory system: Secondary | ICD-10-CM | POA: Diagnosis present

## 2014-09-19 DIAGNOSIS — E1122 Type 2 diabetes mellitus with diabetic chronic kidney disease: Secondary | ICD-10-CM

## 2014-09-19 DIAGNOSIS — Z794 Long term (current) use of insulin: Secondary | ICD-10-CM

## 2014-09-19 HISTORY — DX: Type 2 diabetes mellitus without complications: E11.9

## 2014-09-19 HISTORY — DX: Peripheral vascular disease, unspecified: I73.9

## 2014-09-19 HISTORY — DX: Unqualified visual loss, left eye, normal vision right eye: H54.62

## 2014-09-19 LAB — COMPREHENSIVE METABOLIC PANEL
ALT: 37 U/L (ref 17–63)
AST: 25 U/L (ref 15–41)
Albumin: 3.4 g/dL — ABNORMAL LOW (ref 3.5–5.0)
Alkaline Phosphatase: 68 U/L (ref 38–126)
Anion gap: 6 (ref 5–15)
BUN: 24 mg/dL — ABNORMAL HIGH (ref 6–20)
CO2: 27 mmol/L (ref 22–32)
Calcium: 8.6 mg/dL — ABNORMAL LOW (ref 8.9–10.3)
Chloride: 104 mmol/L (ref 101–111)
Creatinine, Ser: 1.54 mg/dL — ABNORMAL HIGH (ref 0.61–1.24)
GFR calc Af Amer: 52 mL/min — ABNORMAL LOW (ref >60)
GFR calc non Af Amer: 45 mL/min — ABNORMAL LOW (ref >60)
Glucose, Bld: 216 mg/dL — ABNORMAL HIGH (ref 65–99)
Potassium: 4.5 mmol/L (ref 3.5–5.1)
Sodium: 137 mmol/L (ref 135–145)
Total Bilirubin: 0.8 mg/dL (ref 0.3–1.2)
Total Protein: 6.8 g/dL (ref 6.5–8.1)

## 2014-09-19 LAB — CBC
HCT: 34 % — ABNORMAL LOW (ref 39.0–52.0)
Hemoglobin: 11.1 g/dL — ABNORMAL LOW (ref 13.0–17.0)
MCH: 30.9 pg (ref 26.0–34.0)
MCHC: 32.6 g/dL (ref 30.0–36.0)
MCV: 94.7 fL (ref 78.0–100.0)
Platelets: 269 10*3/uL (ref 150–400)
RBC: 3.59 MIL/uL — ABNORMAL LOW (ref 4.22–5.81)
RDW: 13.6 % (ref 11.5–15.5)
WBC: 4.6 10*3/uL (ref 4.0–10.5)

## 2014-09-19 LAB — GLUCOSE, CAPILLARY
Glucose-Capillary: 178 mg/dL — ABNORMAL HIGH (ref 65–99)
Glucose-Capillary: 208 mg/dL — ABNORMAL HIGH (ref 65–99)
Glucose-Capillary: 164 mg/dL — ABNORMAL HIGH (ref 65–99)

## 2014-09-19 MED ORDER — IBUPROFEN 200 MG PO TABS
400.0000 mg | ORAL_TABLET | Freq: Four times a day (QID) | ORAL | Status: DC | PRN
  Administered 2014-09-20: 11:00:00 400 mg via ORAL
  Filled 2014-09-19: qty 1

## 2014-09-19 MED ORDER — INSULIN GLARGINE 100 UNIT/ML ~~LOC~~ SOLN
20.0000 [IU] | Freq: Two times a day (BID) | SUBCUTANEOUS | Status: DC
  Administered 2014-09-19 – 2014-09-21 (×4): 20 [IU] via SUBCUTANEOUS
  Filled 2014-09-19 (×6): qty 0.2

## 2014-09-19 MED ORDER — CLOPIDOGREL BISULFATE 75 MG PO TABS
75.0000 mg | ORAL_TABLET | Freq: Every day | ORAL | Status: DC
  Administered 2014-09-20: 75 mg via ORAL
  Filled 2014-09-19 (×2): qty 1

## 2014-09-19 MED ORDER — ASPIRIN EC 325 MG PO TBEC
325.0000 mg | DELAYED_RELEASE_TABLET | Freq: Every day | ORAL | Status: DC
  Administered 2014-09-19: 325 mg via ORAL
  Filled 2014-09-19 (×2): qty 1

## 2014-09-19 MED ORDER — LOSARTAN POTASSIUM 50 MG PO TABS
50.0000 mg | ORAL_TABLET | Freq: Every day | ORAL | Status: DC
  Administered 2014-09-20 – 2014-09-21 (×2): 50 mg via ORAL
  Filled 2014-09-19 (×4): qty 1

## 2014-09-19 MED ORDER — DORZOLAMIDE HCL-TIMOLOL MAL 2-0.5 % OP SOLN
1.0000 [drp] | Freq: Two times a day (BID) | OPHTHALMIC | Status: DC
  Administered 2014-09-19 – 2014-09-21 (×5): 1 [drp] via OPHTHALMIC
  Filled 2014-09-19: qty 10

## 2014-09-19 MED ORDER — INSULIN ASPART 100 UNIT/ML ~~LOC~~ SOLN
0.0000 [IU] | Freq: Three times a day (TID) | SUBCUTANEOUS | Status: DC
  Administered 2014-09-19: 5 [IU] via SUBCUTANEOUS
  Administered 2014-09-21: 3 [IU] via SUBCUTANEOUS

## 2014-09-19 MED ORDER — GABAPENTIN 300 MG PO CAPS
300.0000 mg | ORAL_CAPSULE | Freq: Three times a day (TID) | ORAL | Status: DC
  Administered 2014-09-19 – 2014-09-21 (×6): 300 mg via ORAL
  Filled 2014-09-19 (×8): qty 1

## 2014-09-19 MED ORDER — BRIMONIDINE TARTRATE 0.2 % OP SOLN
1.0000 [drp] | Freq: Two times a day (BID) | OPHTHALMIC | Status: DC
  Administered 2014-09-19 – 2014-09-21 (×4): 1 [drp] via OPHTHALMIC
  Filled 2014-09-19: qty 5

## 2014-09-19 MED ORDER — DORZOLAMIDE HCL-TIMOLOL MAL 2-0.5 % OP SOLN: 1.0000 [drp] | Freq: Two times a day (BID) | OPHTHALMIC | Status: DC

## 2014-09-19 MED ORDER — ENOXAPARIN SODIUM 40 MG/0.4ML ~~LOC~~ SOLN
40.0000 mg | SUBCUTANEOUS | Status: DC
  Administered 2014-09-19 – 2014-09-20 (×2): 40 mg via SUBCUTANEOUS
  Filled 2014-09-19 (×3): qty 0.4

## 2014-09-19 MED ORDER — ATORVASTATIN CALCIUM 20 MG PO TABS
20.0000 mg | ORAL_TABLET | Freq: Every day | ORAL | Status: DC
  Filled 2014-09-19 (×4): qty 1

## 2014-09-19 MED ORDER — SODIUM CHLORIDE 0.9 % IV SOLN
INTRAVENOUS | Status: DC
  Administered 2014-09-19: 15:00:00 via INTRAVENOUS

## 2014-09-19 MED ORDER — FAMOTIDINE 20 MG PO TABS
20.0000 mg | ORAL_TABLET | Freq: Every day | ORAL | Status: DC
  Administered 2014-09-19 – 2014-09-21 (×3): 20 mg via ORAL
  Filled 2014-09-19 (×3): qty 1

## 2014-09-19 NOTE — Progress Notes (Signed)
Pt direct admit for aortogram mon 7/18, pt a/o, no c/o pain, pt started on NS @ 27ml/hr, pt ind, VSS, pt stable, pt will be NPO at midnight
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
## 2014-09-20 ENCOUNTER — Encounter (HOSPITAL_COMMUNITY)
Admission: RE | Disposition: A | Discharge status: Home / Self Care | Payer: Medicare Other | Source: Ambulatory Visit | Attending: Cardiovascular Disease

## 2014-09-20 ENCOUNTER — Encounter (HOSPITAL_COMMUNITY)
Admission: RE | Disposition: A | Discharge status: Home / Self Care | Payer: Self-pay | Source: Ambulatory Visit | Attending: Cardiovascular Disease

## 2014-09-20 ENCOUNTER — Encounter (HOSPITAL_COMMUNITY): Payer: Self-pay | Admitting: Cardiovascular Disease

## 2014-09-20 DIAGNOSIS — I7092 Chronic total occlusion of artery of the extremities: Secondary | ICD-10-CM | POA: Diagnosis not present

## 2014-09-20 DIAGNOSIS — I739 Peripheral vascular disease, unspecified: Secondary | ICD-10-CM | POA: Diagnosis present

## 2014-09-20 DIAGNOSIS — Z7982 Long term (current) use of aspirin: Secondary | ICD-10-CM | POA: Diagnosis not present

## 2014-09-20 DIAGNOSIS — I70211 Atherosclerosis of native arteries of extremities with intermittent claudication, right leg: Secondary | ICD-10-CM | POA: Diagnosis not present

## 2014-09-20 DIAGNOSIS — Z7902 Long term (current) use of antithrombotics/antiplatelets: Secondary | ICD-10-CM | POA: Diagnosis not present

## 2014-09-20 DIAGNOSIS — Z9862 Peripheral vascular angioplasty status: Secondary | ICD-10-CM

## 2014-09-20 DIAGNOSIS — E785 Hyperlipidemia, unspecified: Secondary | ICD-10-CM | POA: Diagnosis not present

## 2014-09-20 DIAGNOSIS — I10 Essential (primary) hypertension: Secondary | ICD-10-CM | POA: Diagnosis not present

## 2014-09-20 HISTORY — PX: PERIPHERAL VASCULAR CATHETERIZATION: SHX172C

## 2014-09-20 LAB — POCT ACTIVATED CLOTTING TIME
Activated Clotting Time: 214 seconds
Activated Clotting Time: 227 seconds
Activated Clotting Time: 239 seconds
Activated Clotting Time: 171 seconds

## 2014-09-20 LAB — GLUCOSE, CAPILLARY
Glucose-Capillary: 109 mg/dL — ABNORMAL HIGH (ref 65–99)
Glucose-Capillary: 70 mg/dL (ref 65–99)
Glucose-Capillary: 87 mg/dL (ref 65–99)
Glucose-Capillary: 100 mg/dL — ABNORMAL HIGH (ref 65–99)

## 2014-09-20 LAB — APTT: aPTT: 29 seconds (ref 24–37)

## 2014-09-20 SURGERY — LOWER EXTREMITY ANGIOGRAPHY

## 2014-09-20 SURGERY — PERIPHERAL VASCULAR ATHERECTOMY

## 2014-09-20 MED ORDER — ACETAMINOPHEN 325 MG PO TABS: 650.0000 mg | ORAL_TABLET | ORAL | Status: DC | PRN

## 2014-09-20 MED ORDER — CLOPIDOGREL BISULFATE 75 MG PO TABS
75.0000 mg | ORAL_TABLET | Freq: Every day | ORAL | Status: DC
Start: 2014-09-21 — End: 2014-09-21
  Administered 2014-09-21: 75 mg via ORAL
  Filled 2014-09-20: qty 1

## 2014-09-20 MED ORDER — HEPARIN (PORCINE) IN NACL 2-0.9 UNIT/ML-% IJ SOLN
INTRAMUSCULAR | Status: AC
  Filled 2014-09-20: qty 1000

## 2014-09-20 MED ORDER — IODIXANOL 320 MG/ML IV SOLN
INTRAVENOUS | Status: DC | PRN
  Administered 2014-09-20: 145 mL via INTRAVENOUS

## 2014-09-20 MED ORDER — NICOTINE 14 MG/24HR TD PT24
14.0000 mg | MEDICATED_PATCH | Freq: Every day | TRANSDERMAL | Status: DC
  Administered 2014-09-20 – 2014-09-21 (×2): 14 mg via TRANSDERMAL
  Filled 2014-09-20 (×2): qty 1

## 2014-09-20 MED ORDER — ONDANSETRON HCL 4 MG/2ML IJ SOLN: 4.0000 mg | Freq: Four times a day (QID) | INTRAMUSCULAR | Status: DC | PRN

## 2014-09-20 MED ORDER — HYDRALAZINE HCL 20 MG/ML IJ SOLN
INTRAMUSCULAR | Status: AC
  Filled 2014-09-20: qty 1

## 2014-09-20 MED ORDER — YOU HAVE A PACEMAKER BOOK
Freq: Once | Status: DC
  Filled 2014-09-20: qty 1

## 2014-09-20 MED ORDER — LIDOCAINE HCL (PF) 1 % IJ SOLN
INTRAMUSCULAR | Status: AC
  Filled 2014-09-20: qty 30

## 2014-09-20 MED ORDER — LIVING WELL WITH DIABETES BOOK
Freq: Once | Status: AC
  Administered 2014-09-20: 20:00:00
  Filled 2014-09-20: qty 1

## 2014-09-20 MED ORDER — SODIUM CHLORIDE 0.9 % IV SOLN
INTRAVENOUS | Status: AC
  Administered 2014-09-20: 16:00:00 via INTRAVENOUS

## 2014-09-20 MED ORDER — FENTANYL CITRATE (PF) 100 MCG/2ML IJ SOLN
INTRAMUSCULAR | Status: AC
  Filled 2014-09-20: qty 2

## 2014-09-20 MED ORDER — MIDAZOLAM HCL 2 MG/2ML IJ SOLN
INTRAMUSCULAR | Status: DC | PRN
  Administered 2014-09-20 (×2): 1 mg via INTRAVENOUS

## 2014-09-20 MED ORDER — FENTANYL CITRATE (PF) 100 MCG/2ML IJ SOLN
INTRAMUSCULAR | Status: DC | PRN
  Administered 2014-09-20 (×2): 25 ug via INTRAVENOUS

## 2014-09-20 MED ORDER — ANGIOPLASTY BOOK
Freq: Once | Status: AC
  Administered 2014-09-20: 20:00:00
  Filled 2014-09-20: qty 1

## 2014-09-20 MED ORDER — LIDOCAINE HCL (PF) 1 % IJ SOLN
INTRAMUSCULAR | Status: DC | PRN
  Administered 2014-09-20: 15 mL

## 2014-09-20 MED ORDER — HEPARIN SODIUM (PORCINE) 1000 UNIT/ML IJ SOLN
INTRAMUSCULAR | Status: DC | PRN
  Administered 2014-09-20 (×2): 2500 [IU] via INTRAVENOUS
  Administered 2014-09-20: 6000 [IU] via INTRAVENOUS

## 2014-09-20 MED ORDER — MORPHINE SULFATE 2 MG/ML IJ SOLN: 2.0000 mg | INTRAMUSCULAR | Status: DC | PRN

## 2014-09-20 MED ORDER — HEPARIN SODIUM (PORCINE) 1000 UNIT/ML IJ SOLN
INTRAMUSCULAR | Status: AC
  Filled 2014-09-20: qty 1

## 2014-09-20 MED ORDER — MIDAZOLAM HCL 2 MG/2ML IJ SOLN
INTRAMUSCULAR | Status: AC
  Filled 2014-09-20: qty 2

## 2014-09-20 MED ORDER — NITROGLYCERIN 1 MG/10 ML FOR IR/CATH LAB
INTRA_ARTERIAL | Status: AC
  Filled 2014-09-20: qty 10

## 2014-09-20 MED ORDER — ASPIRIN EC 325 MG PO TBEC
325.0000 mg | DELAYED_RELEASE_TABLET | Freq: Every day | ORAL | Status: DC
  Administered 2014-09-20 – 2014-09-21 (×2): 325 mg via ORAL
  Filled 2014-09-20 (×2): qty 1

## 2014-09-20 MED ORDER — HYDRALAZINE HCL 20 MG/ML IJ SOLN
INTRAMUSCULAR | Status: DC | PRN
Start: 2014-09-20 — End: 2014-09-20
  Administered 2014-09-20: 10 mg via INTRAVENOUS

## 2014-09-20 MED ORDER — HYDRALAZINE HCL 20 MG/ML IJ SOLN
10.0000 mg | INTRAMUSCULAR | Status: DC | PRN
  Administered 2014-09-20: 21:00:00 10 mg via INTRAVENOUS
  Filled 2014-09-20 (×2): qty 1

## 2014-09-20 SURGICAL SUPPLY — 26 items
BALLN ARMADA 2.0X80X150 (BALLOONS) ×3
BALLN LUTONIX 5X150X130 (BALLOONS) ×3
BALLOON ARMADA 2.0X80X150 (BALLOONS) ×1 IMPLANT
BALLOON LUTONIX 5X150X130 (BALLOONS) ×1 IMPLANT
BALLOON SABER 5.0X100X150 (BALLOONS) ×2 IMPLANT
CATH CROSS OVER TEMPO 5F (CATHETERS) ×2 IMPLANT
CATH HAWKONE LX EXTENDED TIP (CATHETERS) ×2 IMPLANT
CATH QUICKCROSS SUPP .035X90CM (MICROCATHETER) ×2 IMPLANT
CATH VIANCE CROSS STAND 150CM (MICROCATHETER) ×3
CATH VIANCE CROSS STD 150CM (MICROCATHETER) ×1 IMPLANT
DEVICE SPIDERFX EMB PROT 6MM (WIRE) ×2 IMPLANT
GUIDEWIRE ASTATO XS 20G 300CM (WIRE) ×2 IMPLANT
GUIDEWIRE REGALIA .014X300CM (WIRE) ×2 IMPLANT
HAND CONTROLLER AVANTA (MISCELLANEOUS) ×2 IMPLANT
KIT ENCORE 26 ADVANTAGE (KITS) ×2 IMPLANT
KIT ESSENTIALS PG (KITS) ×2 IMPLANT
KIT PV (KITS) ×3 IMPLANT
SET AVANTA SINGLE PATIENT (MISCELLANEOUS) ×2 IMPLANT
SHEATH HIGHFLEX ANSEL 7FR 55CM (SHEATH) ×2 IMPLANT
SHEATH PINNACLE 5F 10CM (SHEATH) ×2 IMPLANT
SHEATH PINNACLE 7F 10CM (SHEATH) ×2 IMPLANT
SYR MEDRAD MARK V 150ML (SYRINGE) ×2 IMPLANT
TAPE RADIOPAQUE TURBO (MISCELLANEOUS) ×2 IMPLANT
TRANSDUCER W/STOPCOCK (MISCELLANEOUS) ×3 IMPLANT
TRAY PV CATH (CUSTOM PROCEDURE TRAY) ×3 IMPLANT
TUBING CIL FLEX 10 FLL-RA (TUBING) ×2 IMPLANT

## 2014-09-20 NOTE — Progress Notes (Signed)
Site area: left groin  Site Prior to Removal:  Level 0  Pressure Applied For 25 MINUTES    Minutes Beginning at 1200  Manual:   Yes.    Patient Status During Pull:  Patient remains A&O by 4, no complaints to voiced or distress noted.   Post Pull Groin Site:  Level 0  Post Pull Instructions Given:  Yes.    Post Pull Pulses Present:  Yes.    Dressing Applied:  Yes.    Comments:  Patient tolerated well. No complaints voiced or distress noted. Sheath removed per protocol. No hematoma noted.

## 2014-09-20 NOTE — Progress Notes (Signed)
Report called to [facility] [id number]/\.

## 2014-09-20 NOTE — H&P (View-Only) (Signed)
08/25/2014 Harry [name]   [birth year]  [id number]  Primary Physician [name], MD Primary Cardiologist: Lorretta Harp MD Renae Gloss   HPI:   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 He was referred by Dr. Barkley Bruns, his podiatrist, for evaluation of critical limb ischemia. His primary care physician is Dr. Legrand Rams and [facility]. He is a retired Administrator for [employer]. His cardiovascular risk factor profile is notable for 50-pack-years of tobacco abuse currently smoking one pack per day and recalcitrant risk factor modification, treated diabetes, hypertension and hyperlipidemia. 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 He denies chest pain or shortness of breath. He does have hip buttock and thigh heaviness with ambulation which may or may not be claudication as well as a healing right second toe ulcer probably related to diabetic nephropathy and physical trauma. Since I saw him 2 months ago his right second toe ulcer has healed. Dopplers performed in our office 07/09/14 revealed ABIs in the 0.7 range with occluded SFAs bilaterally and tibial disease on the right.   Current Outpatient Prescriptions  Medication Sig Dispense Refill  . Blood Glucose Monitoring Suppl (FREESTYLE LITE) DEVI 1 each by Does not apply route 2 (two) times daily. 100 each 1  . brimonidine (ALPHAGAN) 0.2 % ophthalmic solution Apply 1 drop to eye 2 (two) times daily.    . dorzolamide-timolol (COSOPT) 22.3-6.8 MG/ML ophthalmic solution Apply 1 drop to eye 2 (two) times daily.    [name] Kitchen gabapentin (NEURONTIN) 300 MG capsule Take 300 mg by mouth 3 (three) times daily.  1  . ibuprofen (ADVIL,MOTRIN) 200 MG tablet Take 1 tablet by mouth every 6 (six) hours as  needed.    [name] Kitchen LANTUS SOLOSTAR 100 UNIT/ML Solostar Pen INJECT40 UNITS SUBCUTANEOUS AT BEDTIME  3  . losartan (COZAAR) 50 MG tablet Take 50 mg by mouth daily.      No current facility-administered medications for this visit.    No Known Allergies  History   Social History  . Marital Status: Married    Spouse Name: N/A  . Number of Children: N/A  . Years of Education: N/A   Occupational History  . Not on file.   Social History Main Topics  . Smoking status: Current Every Day Smoker -- 0.50 packs/day    Types: Cigarettes  . Smokeless tobacco: Never Used  . Alcohol Use: No  . Drug Use: No  . Sexual Activity: Not on file   Other Topics Concern  . Not on file   Social History Narrative     Review of Systems: General: negative for chills, fever, night sweats or weight changes.  Cardiovascular: negative for chest pain, dyspnea on exertion, edema, orthopnea, palpitations, paroxysmal nocturnal dyspnea or shortness of breath Dermatological: negative for rash Respiratory: negative for cough or wheezing Urologic: negative for hematuria Abdominal: negative for nausea, vomiting, diarrhea, bright red blood per rectum, melena, or hematemesis Neurologic: negative for visual changes, syncope, or dizziness All other systems reviewed and are otherwise negative except as noted above.    Blood pressure 126/80, pulse 80, height 6' (1.829 m), weight 195 lb (88.451 kg).  General appearance: alert and no distress Neck: no adenopathy, no carotid bruit, no JVD, supple, symmetrical, trachea midline  and thyroid not enlarged, symmetric, no tenderness/mass/nodules Lungs: clear to auscultation bilaterally Heart: regular rate and rhythm, S1, S2 normal, no murmur, click, rub or gallop Extremities: extremities normal, atraumatic, no cyanosis or edema  EKG not performed today  ASSESSMENT AND PLAN:   Critical lower limb ischemia I am seeing [name] back today for follow-up of his Doppler studies  performed one month ago. He was initially sent to me because of a poorly healing right second toe ulcer which has subsequently healed as well as claudication-type symptoms. 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 Discussed options including observation, medical therapy and percutaneous revascularization which the patient wishes to pursue.  We discussed the risks and benefits. I'm going to arrange for him to undergo this sometime within the next several weeks. His wife and daughter were present during the interview.      Lorretta Harp MD FACP,FACC,FAHA, [facility] 08/25/2014 9:54 AM

## 2014-09-20 NOTE — Research (Signed)
SAFE-DCB Informed Consent   Subject Name: Harry [name]  Subject met inclusion and exclusion criteria.  The informed consent form, study requirements and expectations were reviewed with the subject and questions and concerns were addressed prior to the signing of the consent form.  The subject verbalized understanding of the trail requirements.  The subject agreed to participate in the SAFE-DCB trial and signed the informed consent.  The informed consent was obtained prior to performance of any protocol-specific procedures for the subject.  A copy of the signed informed consent was given to the subject and a copy was placed in the subject's medical record.  Hedrick,Tammy W 09/20/2014, 6:39 AM

## 2014-09-20 NOTE — Interval H&P Note (Signed)
History and Physical Interval Note:  09/20/2014 7:34 AM  Harry [name]  has presented today for surgery, with the diagnosis of claudication/pad  The various methods of treatment have been discussed with the patient and family. After consideration of risks, benefits and other options for treatment, the patient has consented to  Procedure(s): Lower Extremity Angiography (N/A) as a surgical intervention .  The patient's history has been reviewed, patient examined, no change in status, stable for surgery.  I have reviewed the patient's chart and labs.  Questions were answered to the patient's satisfaction.     Quay Burow

## 2014-09-21 ENCOUNTER — Other Ambulatory Visit: Payer: Self-pay | Admitting: Physician Assistant

## 2014-09-21 DIAGNOSIS — Z7982 Long term (current) use of aspirin: Secondary | ICD-10-CM | POA: Diagnosis not present

## 2014-09-21 DIAGNOSIS — Z7902 Long term (current) use of antithrombotics/antiplatelets: Secondary | ICD-10-CM | POA: Diagnosis not present

## 2014-09-21 DIAGNOSIS — I739 Peripheral vascular disease, unspecified: Secondary | ICD-10-CM | POA: Diagnosis not present

## 2014-09-21 DIAGNOSIS — I7092 Chronic total occlusion of artery of the extremities: Secondary | ICD-10-CM | POA: Diagnosis not present

## 2014-09-21 DIAGNOSIS — E785 Hyperlipidemia, unspecified: Secondary | ICD-10-CM | POA: Diagnosis not present

## 2014-09-21 DIAGNOSIS — I10 Essential (primary) hypertension: Secondary | ICD-10-CM | POA: Diagnosis not present

## 2014-09-21 DIAGNOSIS — I70211 Atherosclerosis of native arteries of extremities with intermittent claudication, right leg: Secondary | ICD-10-CM | POA: Diagnosis not present

## 2014-09-21 LAB — BASIC METABOLIC PANEL
Anion gap: 6 (ref 5–15)
BUN: 20 mg/dL (ref 6–20)
CO2: 25 mmol/L (ref 22–32)
Calcium: 8.2 mg/dL — ABNORMAL LOW (ref 8.9–10.3)
Chloride: 108 mmol/L (ref 101–111)
Creatinine, Ser: 1.3 mg/dL — ABNORMAL HIGH (ref 0.61–1.24)
GFR calc Af Amer: >60 mL/min (ref >60)
GFR calc non Af Amer: 55 mL/min — ABNORMAL LOW (ref >60)
Glucose, Bld: 152 mg/dL — ABNORMAL HIGH (ref 65–99)
Potassium: 3.6 mmol/L (ref 3.5–5.1)
Sodium: 139 mmol/L (ref 135–145)

## 2014-09-21 LAB — CBC
HCT: 30 % — ABNORMAL LOW (ref 39.0–52.0)
Hemoglobin: 9.9 g/dL — ABNORMAL LOW (ref 13.0–17.0)
MCH: 31.4 pg (ref 26.0–34.0)
MCHC: 33 g/dL (ref 30.0–36.0)
MCV: 95.2 fL (ref 78.0–100.0)
Platelets: 243 10*3/uL (ref 150–400)
RBC: 3.15 MIL/uL — ABNORMAL LOW (ref 4.22–5.81)
RDW: 14.1 % (ref 11.5–15.5)
WBC: 5.3 10*3/uL (ref 4.0–10.5)

## 2014-09-21 LAB — GLUCOSE, CAPILLARY: Glucose-Capillary: 156 mg/dL — ABNORMAL HIGH (ref 65–99)

## 2014-09-21 MED ORDER — ATORVASTATIN CALCIUM 40 MG PO TABS: 40.0000 mg | ORAL_TABLET | Freq: Every day | ORAL | Status: DC

## 2014-09-21 MED FILL — Nitroglycerin IV Soln 100 MCG/ML in D5W: INTRA_ARTERIAL | Qty: 10 | Status: AC

## 2014-09-21 MED FILL — Heparin Sodium (Porcine) 2 Unit/ML in Sodium Chloride 0.9%: INTRAMUSCULAR | Qty: 1000 | Status: AC

## 2014-09-21 NOTE — Progress Notes (Signed)
Patient Name: Harry [name] Date of Encounter: 09/21/2014  Principal Problem:   Critical lower limb ischemia Active Problems:   DM2 (diabetes mellitus, type 2)   Hyperlipidemia LDL goal <70   TOBACCO USE   Essential hypertension   PAD (peripheral artery disease)   Claudication   S/P peripheral artery angioplasty  SUBJECTIVE  Feels better. No chest pain, SOB or palpation.   CURRENT MEDS . aspirin EC  325 mg Oral Daily  . atorvastatin  20 mg Oral Daily  . brimonidine  1 drop Both Eyes BID  . clopidogrel  75 mg Oral Q breakfast  . dorzolamide-timolol  1 drop Both Eyes BID  . enoxaparin (LOVENOX) injection  40 mg Subcutaneous Q24H  . famotidine  20 mg Oral Daily  . gabapentin  300 mg Oral TID  . insulin aspart  0-15 Units Subcutaneous TID WC  . insulin glargine  20 Units Subcutaneous BID  . losartan  50 mg Oral Daily  . nicotine  14 mg Transdermal Daily    OBJECTIVE  Filed Vitals:   09/20/14 2215 09/21/14 0016 09/21/14 0608 09/21/14 0747  BP: 121/57 118/52 167/79 127/66  Pulse:  89 94 96  Temp:  97.8 F (36.6 C) 97.6 F (36.4 C) 98.1 F (36.7 C)  TempSrc:  Oral Oral Oral  Resp: 20 16 20 18   Height:      Weight:  198 lb 6.6 oz (90 kg)    SpO2: 99% 96% 97% 97%    Intake/Output Summary (Last 24 hours) at 09/21/14 0841 Last data filed at 09/21/14 0747  Gross per 24 hour  Intake 1331.25 ml  Output   1450 ml  Net -118.75 ml   Filed Weights   09/19/14 1055 09/20/14 0527 09/21/14 0016  Weight: 194 lb 6.4 oz (88.179 kg) 195 lb 9.6 oz (88.724 kg) 198 lb 6.6 oz (90 kg)    PHYSICAL EXAM  General: Pleasant, NAD. Neuro: Alert and oriented X 3. Moves all extremities spontaneously. Psych: Normal affect. HEENT:  Normal  Neck: Supple without bruits or JVD. Lungs:  Resp regular and unlabored, CTA. Heart: RRR no s3, s4, or murmurs. Abdomen: Soft, non-tender, non-distended, BS + x 4.  Extremities: No clubbing, cyanosis or edema. DP/PT/Radials 2+ and equal  bilaterally.  Accessory Clinical Findings  CBC  Recent Labs  09/19/14 1429 09/21/14 0325  WBC 4.6 5.3  HGB 11.1* 9.9*  HCT 34.0* 30.0*  MCV 94.7 95.2  PLT 269 0000000   Basic Metabolic Panel  Recent Labs  09/19/14 1429 09/21/14 0325  NA 137 139  K 4.5 3.6  CL 104 108  CO2 27 25  GLUCOSE 216* 152*  BUN 24* 20  CREATININE 1.54* 1.30*  CALCIUM 8.6* 8.2*   Liver Function Tests  Recent Labs  09/19/14 1429  AST 25  ALT 37  ALKPHOS 68  BILITOT 0.8  PROT 6.8  ALBUMIN 3.4*    TELE  NSR at rate of 90s with transient sinus tachycardia at rate of 120s.   Radiology/Studies  Dg Chest 2 View  08/25/2014   CLINICAL DATA:  Preop for cardiac catheterization, smoking history, diabetes  EXAM: CHEST  2 VIEW  COMPARISON:  Chest x-ray of 01/10/2011  FINDINGS: No active infiltrate or effusion is seen. Mediastinal and hilar contours are unremarkable. The heart is within normal limits in size. No acute bony abnormality is seen.  IMPRESSION: No active cardiopulmonary disease.   Electronically Signed   By: Ivar Drape M.D.   On:  08/25/2014 14:20    PV angiogram/Intervenstion 7/18   Pre Procedure Diagnosis: peripheral arterial disease  Post Procedure Diagnosis: peripheral arterial disease  Operators: Dr. Quay Burow  Procedures Performed: 1. Accessed left common femoral artery, obtained contralateral access with a 7 French sheath 2. Crossed chronic total occlusion with a Viance catheter and placed a spider distal protection device 3. Hawk 1 directional atherectomy right SFA 4. Drug eluting balloon angioplasty right SFA  PROCEDURE DESCRIPTION:   The patient was brought to the second floor [facility] cath lab in the the postabsorptive state. He was premedicated with Valium 5 mg's by mouth, IV Versed and fentanyl. His left groin was prepped and shaved in usual sterile fashion. Xylocaine 1% was used for local anesthesia. A 5  French sheath was inserted into the left common femoral artery using standard Seldinger technique. Contralateral access was obtained with a crossover catheter and 7 [place] multipurpose 55 cm Ansel sheath. The patient received 11,000 units of heparin intravenously with an ACT of 239. Total contrast used during the case was 145 mL. I was able to cross the chronic total crit occlusion with a Viance CTO catheter and an 0.14 Astato 20 CTO wire. I then placed a 6 mm spider distal protection device in the above-the-knee popliteal artery. I performed predilatation with a 2 mm over-the-wire balloon and directional atherectomy with a hawk 1 device performing multiple circumferential cut and removing a copious amount of atherosclerotic plaque. I then dilated the treated segment with a 5 mm balloon and performed drug eluting balloon angioplasty with a 5 mm x 150 mm long lutonix drug eluding balloon at nominal pressures for 2 minutes 30 sec. Final angiogram result with reduction of a chronic total occlusion to less than 10% residual. There is a small linear dissection in the mid SFA that was not flow limiting. There was two-vessel runoff on completion angiography. The distal protection device was then captured and the sheath withdrawn across the bifurcation and exchanged over an 035 wire for a short 7 [place] sheath. This patient left the lab in stable condition.  IMPRESSION:successful Hawk 1 directional atherectomy, drug eluting balloon angioplasty of a right SFA chronic total occlusion performed in a staged fashion after the SFA was intervened on approximately 3 weeks ago. The patient tolerated the procedure well. His creatinine was 1.54 and he will be hydrated overnight.the sheath will be removed once he falls below 170 pressure held. The patient will be treated with double and triple therapy, discharged home in the morning. He will get follow-up lower extremity arterial Doppler studies in our [facility] office next week.  I will see him back in 2-3 weeks for follow-up.  ASSESSMENT AND PLAN   1. PAD: Right SFA due to long 75-80% segmental proximal right SFA stenosis followed by a chronic total occlusion in the midportion reconstituting in the adductor canal two-vessel runoff. The anterior tibial was occluded. S/P Left Placement of spider distal protection device in the below-the-knee popliteal artery Hawk 1 directional atherectomy of the entire left SFA Drug-eluting balloon and plasty of the entire left SFA.  - PV angiogram/Intervenstion 7/18 -successful Hawk 1 directional atherectomy, drug eluting balloon angioplasty of a right SFA chronic total occlusion performed in a staged fashion after the SFA was intervened on approximately 3 weeks ago. - plan for lower extremity arterial Doppler studies in [place] line office next week.  2-3 weeks  follow-up with Dr. Gwenlyn Found.  - Continue plavix and ASA. Patient has refused taking plavix for  the past two days, states it makes my blood sugar high.   2. Hypertension: Continue home medications, losartan 50 mg daily. Relatively stable. Consider adding BB to help sinus tachycardia.   3. Diabetes: Sliding scale insulin. AT home on insulin only as well. F/u with PCP.   4. AKI - Creatinine improved to 1.30 from 1.54. Continue to monitor. On losartan.   Jarrett Soho PA-C Pager [phone]

## 2014-09-21 NOTE — Discharge Summary (Signed)
Discharge Summary   Patient ID: Harry [name],  MRN: [id number], DOB/AGE: 1946/02/05 69 y.o.  Admit date: 09/19/2014 Discharge date: 09/21/2014  Primary Care Provider: FANTA,[name] Primary Cardiologist: Quay Burow   Discharge Diagnoses Principal Problem:   Critical lower limb ischemia Active Problems:   DM2 (diabetes mellitus, type 2)   Hyperlipidemia LDL goal <70   TOBACCO USE   Essential hypertension   PAD (peripheral artery disease)   Claudication   S/P peripheral artery angioplasty   Allergies No Known Allergies  Procedures  PV angiogram/Intervenstion 7/18   Pre Procedure Diagnosis: peripheral arterial disease  Post Procedure Diagnosis: peripheral arterial disease  Operators: Dr. Quay Burow  Procedures Performed: 1. Accessed left common femoral artery, obtained contralateral access with a 7 French sheath 2. Crossed chronic total occlusion with a Viance catheter and placed a spider distal protection device 3. Hawk 1 directional atherectomy right SFA 4. Drug eluting balloon angioplasty right SFA  PROCEDURE DESCRIPTION:   The patient was brought to the second floor [facility] cath lab in the the postabsorptive state. He was premedicated with Valium 5 mg's by mouth, IV Versed and fentanyl. His left groin was prepped and shaved in usual sterile fashion. Xylocaine 1% was used for local anesthesia. A 5 French sheath was inserted into the left common femoral artery using standard Seldinger technique. Contralateral access was obtained with a crossover catheter and 7 [place] multipurpose 55 cm Ansel sheath. The patient received 11,000 units of heparin intravenously with an ACT of 239. Total contrast used during the case was 145 mL. I was able to cross the chronic total crit occlusion with a Viance CTO catheter and an 0.14 Astato 20 CTO wire. I then placed a 6 mm spider distal protection device in the  above-the-knee popliteal artery. I performed predilatation with a 2 mm over-the-wire balloon and directional atherectomy with a hawk 1 device performing multiple circumferential cut and removing a copious amount of atherosclerotic plaque. I then dilated the treated segment with a 5 mm balloon and performed drug eluting balloon angioplasty with a 5 mm x 150 mm long lutonix drug eluding balloon at nominal pressures for 2 minutes 30 sec. Final angiogram result with reduction of a chronic total occlusion to less than 10% residual. There is a small linear dissection in the mid SFA that was not flow limiting. There was two-vessel runoff on completion angiography. The distal protection device was then captured and the sheath withdrawn across the bifurcation and exchanged over an 035 wire for a short 7 [place] sheath. This patient left the lab in stable condition.  IMPRESSION:successful Hawk 1 directional atherectomy, drug eluting balloon angioplasty of a right SFA chronic total occlusion performed in a staged fashion after the SFA was intervened on approximately 3 weeks ago. The patient tolerated the procedure well. His creatinine was 1.54 and he will be hydrated overnight.the sheath will be removed once he falls below 170 pressure held. The patient will be treated with double and triple therapy, discharged home in the morning. He will get follow-up lower extremity arterial Doppler studies in our [facility] office next week. I will see him back in 2-3 weeks for follow-up.   History of Present Illness  Harry [name] is a 12 y.o.male father of 3 children, grandfather of 3 grandchildren who is accompanied by his wife [name], and her daughter who presented 7/17 to [facility] for scheduled arteriogram.   He was referred by Dr. Barkley Bruns, his podiatrist, for evaluation of critical limb ischemia.  His primary care physician is Dr. Legrand Rams at [facility]. He is a retired Administrator for [employer]. His cardiovascular  risk factor profile is notable for 50-pack-years of tobacco abuse currently smoking one pack per day and recalcitrant risk factor modification, treated diabetes, hypertension and hyperlipidemia. He denied chest pain or shortness of breath. He does have hip buttock and thigh heaviness with ambulation which may or may not be claudication as well as a healing right second toe ulcer probably related to diabetic nephropathy and physical trauma. His right second toe ulcer has healed. Dopplers performed in  office 07/09/14 revealed ABIs in the 0.7 range with occluded SFAs bilaterally and tibial disease on the right.  On 08/30/2014 he had :   1. Abdominal aortogram, bilateral iliac angiogram, bifemoral runoff 2. Placement of spider distal protection device in the below-the-knee popliteal artery 3. Hawk 1 directional atherectomy of the entire left SFA 4. Drug-eluting balloon and plasty of the entire left SFA  He is admitted for staged intervention on occluded right SFA due to long 75-80% segmental proximal right SFA stenosis followed by a chronic total occlusion in the midportion reconstituting in the adductor canal two-vessel runoff. The anterior tibial was occluded.   Hospital Course  Hydration is given prior to procedure. His losartan, statin, plavix and asa was continued. He was placed on SSI.   Patient had a successful Hawk 1 directional atherectomy, drug eluting balloon angioplasty of a right SFA chronic total occlusion performed in a staged fashion after the SFA was intervened on approximately 3 weeks ago. His creatinine was 1.54 during admission that resolved with hydration to 1.30 post surgery. Cath site was stable. Pt had palpable 1+ pulse in RLE post surgery. His lipitor was increased to 40mg  given progression of PAD. He was discharged stably with plan of lower extremity arterial Doppler studies in [place] line office next week. 2-3 weeks follow-up with  Dr. Gwenlyn Found. He was counseled on smoking cessation.    Discharge Vitals Blood pressure 127/66, pulse 96, temperature 98.1 F (36.7 C), temperature source Oral, resp. rate 18, height 6' (1.829 m), weight 198 lb 6.6 oz (90 kg), SpO2 97 %.  Filed Weights   09/19/14 1055 09/20/14 0527 09/21/14 0016  Weight: 194 lb 6.4 oz (88.179 kg) 195 lb 9.6 oz (88.724 kg) 198 lb 6.6 oz (90 kg)    Labs  CBC  Recent Labs  09/19/14 1429 09/21/14 0325  WBC 4.6 5.3  HGB 11.1* 9.9*  HCT 34.0* 30.0*  MCV 94.7 95.2  PLT 269 0000000   Basic Metabolic Panel  Recent Labs  09/19/14 1429 09/21/14 0325  NA 137 139  K 4.5 3.6  CL 104 108  CO2 27 25  GLUCOSE 216* 152*  BUN 24* 20  CREATININE 1.54* 1.30*  CALCIUM 8.6* 8.2*   Liver Function Tests  Recent Labs  09/19/14 1429  AST 25  ALT 37  ALKPHOS 68  BILITOT 0.8  PROT 6.8  ALBUMIN 3.4*  Disposition  Pt is being discharged home today in good condition.  Follow-up Plans & Appointments  Follow-up Information    Follow up with [facility] On 10/04/2014.   Why:  @ noon for arterial doppler - come as fasting   Contact information:   [address] [place] [id number]      Follow up with Lyda Jester, PA-C On 10/14/2014.   Specialties:  Cardiology, Radiology   Why:  @2 :30 for cardiology f/u   Contact information:  [address] [place] [address] [phone]       Discharge Instructions    Diet - low sodium heart healthy    Complete by:  As directed      Discharge instructions    Complete by:  As directed   Patients taking plavix should generally stay away from medicines like ibuprofen, Advil, Motrin, naproxen, and Aleve due to risk of stomach bleeding. You may take Tylenol as directed or talk to your primary doctor about alternatives.   No driving for 1 week. No lifting over 5 lbs for 1 week. No sexual activity for 1 week.  Keep procedure site clean & dry. If you  notice increased pain, swelling, bleeding or pus, call/return!  You may shower, but no soaking baths/hot tubs/pools for 1 week.     Increase activity slowly    Complete by:  As directed            F/u Labs/Studies: Consider OP f/u labs 6-8 weeks given statin increased dose this admission.  Discharge Medications    Medication List    STOP taking these medications        ibuprofen 200 MG tablet  Commonly known as:  ADVIL,MOTRIN      TAKE these medications        aspirin 325 MG EC tablet  Take 1 tablet (325 mg total) by mouth daily.     atorvastatin 40 MG tablet  Commonly known as:  LIPITOR  Take 1 tablet (40 mg total) by mouth daily.     brimonidine 0.2 % ophthalmic solution  Commonly known as:  ALPHAGAN  Place 1 drop into both eyes 2 (two) times daily.     clopidogrel 75 MG tablet  Commonly known as:  PLAVIX  Take 1 tablet (75 mg total) by mouth daily with breakfast.     dorzolamide-timolol 22.3-6.8 MG/ML ophthalmic solution  Commonly known as:  COSOPT  Place 1 drop into both eyes 2 (two) times daily.     gabapentin 300 MG capsule  Commonly known as:  NEURONTIN  Take 300 mg by mouth 3 (three) times daily.     insulin glargine 100 UNIT/ML injection  Commonly known as:  LANTUS  Inject 20 Units into the skin 2 (two) times daily.     losartan 50 MG tablet  Commonly known as:  COZAAR  Take 50 mg by mouth daily.     ranitidine 150 MG tablet  Commonly known as:  ZANTAC  Take 150 mg by mouth daily.        Duration of Discharge Encounter   Greater than 30 minutes including physician time.  Signed, Nery Kalisz PA-C 09/21/2014, 11:01 AM

## 2014-09-22 ENCOUNTER — Other Ambulatory Visit: Payer: Self-pay | Admitting: Cardiovascular Disease

## 2014-09-22 DIAGNOSIS — I739 Peripheral vascular disease, unspecified: Secondary | ICD-10-CM

## 2014-10-01 DIAGNOSIS — I10 Essential (primary) hypertension: Secondary | ICD-10-CM | POA: Diagnosis not present

## 2014-10-01 DIAGNOSIS — E1165 Type 2 diabetes mellitus with hyperglycemia: Secondary | ICD-10-CM | POA: Diagnosis not present

## 2014-10-01 DIAGNOSIS — I739 Peripheral vascular disease, unspecified: Secondary | ICD-10-CM | POA: Diagnosis not present

## 2014-10-01 DIAGNOSIS — N183 Chronic kidney disease, stage 3 (moderate): Secondary | ICD-10-CM | POA: Diagnosis not present

## 2014-10-01 DIAGNOSIS — F17200 Nicotine dependence, unspecified, uncomplicated: Secondary | ICD-10-CM | POA: Diagnosis not present

## 2014-10-04 ENCOUNTER — Ambulatory Visit (HOSPITAL_COMMUNITY)
Admit: 2014-10-04 | Discharge: 2014-10-04 | Disposition: A | Discharge status: Home / Self Care | Payer: Medicare Other | Source: Ambulatory Visit | Attending: Physician Assistant | Admitting: Physician Assistant

## 2014-10-04 DIAGNOSIS — Z9862 Peripheral vascular angioplasty status: Secondary | ICD-10-CM | POA: Diagnosis not present

## 2014-10-04 DIAGNOSIS — I70213 Atherosclerosis of native arteries of extremities with intermittent claudication, bilateral legs: Secondary | ICD-10-CM | POA: Diagnosis not present

## 2014-10-04 DIAGNOSIS — Z8673 Personal history of transient ischemic attack (TIA), and cerebral infarction without residual deficits: Secondary | ICD-10-CM | POA: Insufficient documentation

## 2014-10-04 DIAGNOSIS — I739 Peripheral vascular disease, unspecified: Secondary | ICD-10-CM | POA: Diagnosis not present

## 2014-10-04 DIAGNOSIS — E119 Type 2 diabetes mellitus without complications: Secondary | ICD-10-CM | POA: Insufficient documentation

## 2014-10-04 DIAGNOSIS — E785 Hyperlipidemia, unspecified: Secondary | ICD-10-CM | POA: Diagnosis not present

## 2014-10-04 DIAGNOSIS — I10 Essential (primary) hypertension: Secondary | ICD-10-CM | POA: Diagnosis not present

## 2014-10-09 ENCOUNTER — Encounter (HOSPITAL_COMMUNITY): Payer: Self-pay | Admitting: Emergency Medicine

## 2014-10-09 ENCOUNTER — Emergency Department (HOSPITAL_COMMUNITY)
Admission: EM | Admit: 2014-10-09 | Discharge: 2014-10-09 | Discharge status: Against Medical Advice | Payer: Medicare Other | Attending: Emergency Medicine | Admitting: Emergency Medicine

## 2014-10-09 ENCOUNTER — Emergency Department (HOSPITAL_COMMUNITY): Payer: Medicare Other

## 2014-10-09 DIAGNOSIS — Z79899 Other long term (current) drug therapy: Secondary | ICD-10-CM | POA: Insufficient documentation

## 2014-10-09 DIAGNOSIS — E785 Hyperlipidemia, unspecified: Secondary | ICD-10-CM | POA: Insufficient documentation

## 2014-10-09 DIAGNOSIS — Z720 Tobacco use: Secondary | ICD-10-CM | POA: Diagnosis not present

## 2014-10-09 DIAGNOSIS — E780 Pure hypercholesterolemia: Secondary | ICD-10-CM | POA: Insufficient documentation

## 2014-10-09 DIAGNOSIS — Z794 Long term (current) use of insulin: Secondary | ICD-10-CM | POA: Diagnosis not present

## 2014-10-09 DIAGNOSIS — H5462 Unqualified visual loss, left eye, normal vision right eye: Secondary | ICD-10-CM | POA: Diagnosis not present

## 2014-10-09 DIAGNOSIS — Z8673 Personal history of transient ischemic attack (TIA), and cerebral infarction without residual deficits: Secondary | ICD-10-CM | POA: Insufficient documentation

## 2014-10-09 DIAGNOSIS — Z7902 Long term (current) use of antithrombotics/antiplatelets: Secondary | ICD-10-CM | POA: Diagnosis not present

## 2014-10-09 DIAGNOSIS — R55 Syncope and collapse: Secondary | ICD-10-CM | POA: Diagnosis not present

## 2014-10-09 DIAGNOSIS — Z7982 Long term (current) use of aspirin: Secondary | ICD-10-CM | POA: Insufficient documentation

## 2014-10-09 DIAGNOSIS — E119 Type 2 diabetes mellitus without complications: Secondary | ICD-10-CM | POA: Diagnosis not present

## 2014-10-09 DIAGNOSIS — G629 Polyneuropathy, unspecified: Secondary | ICD-10-CM | POA: Insufficient documentation

## 2014-10-09 DIAGNOSIS — N289 Disorder of kidney and ureter, unspecified: Secondary | ICD-10-CM | POA: Insufficient documentation

## 2014-10-09 DIAGNOSIS — I10 Essential (primary) hypertension: Secondary | ICD-10-CM | POA: Insufficient documentation

## 2014-10-09 DIAGNOSIS — R42 Dizziness and giddiness: Secondary | ICD-10-CM | POA: Diagnosis not present

## 2014-10-09 DIAGNOSIS — I639 Cerebral infarction, unspecified: Secondary | ICD-10-CM | POA: Diagnosis not present

## 2014-10-09 LAB — CBC WITH DIFFERENTIAL/PLATELET
Basophils Absolute: 0 10*3/uL (ref 0.0–0.1)
Basophils Relative: 0 % (ref 0–1)
Eosinophils Absolute: 0.2 10*3/uL (ref 0.0–0.7)
Eosinophils Relative: 4 % (ref 0–5)
HCT: 30.6 % — ABNORMAL LOW (ref 39.0–52.0)
Hemoglobin: 10 g/dL — ABNORMAL LOW (ref 13.0–17.0)
Lymphocytes Relative: 34 % (ref 12–46)
Lymphs Abs: 1.7 10*3/uL (ref 0.7–4.0)
MCH: 32.2 pg (ref 26.0–34.0)
MCHC: 32.7 g/dL (ref 30.0–36.0)
MCV: 98.4 fL (ref 78.0–100.0)
Monocytes Absolute: 0.6 10*3/uL (ref 0.1–1.0)
Monocytes Relative: 11 % (ref 3–12)
Neutro Abs: 2.6 10*3/uL (ref 1.7–7.7)
Neutrophils Relative %: 51 % (ref 43–77)
Platelets: 277 10*3/uL (ref 150–400)
RBC: 3.11 MIL/uL — ABNORMAL LOW (ref 4.22–5.81)
RDW: 14.4 % (ref 11.5–15.5)
WBC: 5.1 10*3/uL (ref 4.0–10.5)

## 2014-10-09 LAB — TROPONIN I: Troponin I: <0.03 ng/mL (ref <0.031)

## 2014-10-09 LAB — COMPREHENSIVE METABOLIC PANEL
ALT: 27 U/L (ref 17–63)
AST: 22 U/L (ref 15–41)
Albumin: 3.8 g/dL (ref 3.5–5.0)
Alkaline Phosphatase: 58 U/L (ref 38–126)
Anion gap: 6 (ref 5–15)
BUN: 31 mg/dL — ABNORMAL HIGH (ref 6–20)
CO2: 25 mmol/L (ref 22–32)
Calcium: 8.9 mg/dL (ref 8.9–10.3)
Chloride: 108 mmol/L (ref 101–111)
Creatinine, Ser: 1.77 mg/dL — ABNORMAL HIGH (ref 0.61–1.24)
GFR calc Af Amer: 44 mL/min — ABNORMAL LOW (ref >60)
GFR calc non Af Amer: 38 mL/min — ABNORMAL LOW (ref >60)
Glucose, Bld: 224 mg/dL — ABNORMAL HIGH (ref 65–99)
Potassium: 4.1 mmol/L (ref 3.5–5.1)
Sodium: 139 mmol/L (ref 135–145)
Total Bilirubin: 0.7 mg/dL (ref 0.3–1.2)
Total Protein: 7.1 g/dL (ref 6.5–8.1)

## 2014-10-09 LAB — URINALYSIS, ROUTINE W REFLEX MICROSCOPIC
Bilirubin Urine: NEGATIVE
Glucose, UA: 100 mg/dL — AB
Hgb urine dipstick: NEGATIVE
Ketones, ur: NEGATIVE mg/dL
Leukocytes, UA: NEGATIVE
Nitrite: NEGATIVE
Protein, ur: NEGATIVE mg/dL
Specific Gravity, Urine: 1.02 (ref 1.005–1.030)
Urobilinogen, UA: 0.2 mg/dL (ref 0.0–1.0)
pH: 6 (ref 5.0–8.0)

## 2014-10-09 MED ORDER — SODIUM CHLORIDE 0.9 % IV BOLUS (SEPSIS)
1000.0000 mL | Freq: Once | INTRAVENOUS | Status: AC
  Administered 2014-10-09: 1000 mL via INTRAVENOUS

## 2014-10-09 NOTE — Discharge Instructions (Signed)
Syncope °Syncope is a medical term for fainting or passing out. This means you lose consciousness and drop to the ground. People are generally unconscious for less than 5 minutes. You may have some muscle twitches for up to 15 seconds before waking up and returning to normal. Syncope occurs more often in older adults, but it can happen to anyone. While most causes of syncope are not dangerous, syncope can be a sign of a serious medical problem. It is important to seek medical care.  °CAUSES  °Syncope is caused by a sudden drop in blood flow to the brain. The specific cause is often not determined. Factors that can bring on syncope include: °· Taking medicines that lower blood pressure. °· Sudden changes in posture, such as standing up quickly. °· Taking more medicine than prescribed. °· Standing in one place for too long. °· Seizure disorders. °· Dehydration and excessive exposure to heat. °· Low blood sugar (hypoglycemia). °· Straining to have a bowel movement. °· Heart disease, irregular heartbeat, or other circulatory problems. °· Fear, emotional distress, seeing blood, or severe pain. °SYMPTOMS  °Right before fainting, you may: °· Feel dizzy or light-headed. °· Feel nauseous. °· See all white or all black in your field of vision. °· Have cold, clammy skin. °DIAGNOSIS  °Your health care provider will ask about your symptoms, perform a physical exam, and perform an electrocardiogram (ECG) to record the electrical activity of your heart. Your health care provider may also perform other heart or blood tests to determine the cause of your syncope which may include: °· Transthoracic echocardiogram (TTE). During echocardiography, sound waves are used to evaluate how blood flows through your heart. °· Transesophageal echocardiogram (TEE). °· Cardiac monitoring. This allows your health care provider to monitor your heart rate and rhythm in real time. °· Holter monitor. This is a portable device that records your  heartbeat and can help diagnose heart arrhythmias. It allows your health care provider to track your heart activity for several days, if needed. °· Stress tests by exercise or by giving medicine that makes the heart beat faster. °TREATMENT  °In most cases, no treatment is needed. Depending on the cause of your syncope, your health care provider may recommend changing or stopping some of your medicines. °HOME CARE INSTRUCTIONS °· Have someone stay with you until you feel stable. °· Do not drive, use machinery, or play sports until your health care provider says it is okay. °· Keep all follow-up appointments as directed by your health care provider. °· Lie down right away if you start feeling like you might faint. Breathe deeply and steadily. Wait until all the symptoms have passed. °· Drink enough fluids to keep your urine clear or pale yellow. °· If you are taking blood pressure or heart medicine, get up slowly and take several minutes to sit and then stand. This can reduce dizziness. °SEEK IMMEDIATE MEDICAL CARE IF:  °· You have a severe headache. °· You have unusual pain in the chest, abdomen, or back. °· You are bleeding from your mouth or rectum, or you have black or tarry stool. °· You have an irregular or very fast heartbeat. °· You have pain with breathing. °· You have repeated fainting or seizure-like jerking during an episode. °· You faint when sitting or lying down. °· You have confusion. °· You have trouble walking. °· You have severe weakness. °· You have vision problems. °If you fainted, call your local emergency services (911 in [place] Do not drive   yourself to the hospital.  °MAKE SURE YOU: °· Understand these instructions. °· Will watch your condition. °· Will get help right away if you are not doing well or get worse. °Document Released: 02/19/2005 Document Revised: 02/24/2013 Document Reviewed: 04/20/2011 °Exit[employer]® Patient Information ©2015 [employer]. This information is not intended to replace  advice given to you by your health care provider. Make sure you discuss any questions you have with your health care provider. ° °

## 2014-10-09 NOTE — ED Notes (Signed)
Having syncopal episode since last Saturday.  Pt has refused to come in until today.  Last syncope episode was 5 pm today.  Pt is denying any pain of discomfort.  C/o dizziness but denies any other issues.

## 2014-10-09 NOTE — ED Notes (Signed)
Patient stating he does not want to be here in the Ed and refused urinal . Assisted to bathroom . Informed patient to get ua specimen.

## 2014-10-09 NOTE — ED Notes (Signed)
Per family, patient has been having episodes of "blackouts" for a while. Reports that he bent over to get at piece of paper and had fixed stare. States he was minimally responsive for 5 minutes. Patient denies being dizzy.
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
## 2014-10-12 DIAGNOSIS — H40011 Open angle with borderline findings, low risk, right eye: Secondary | ICD-10-CM | POA: Diagnosis not present

## 2014-10-12 DIAGNOSIS — H4011X0 Primary open-angle glaucoma, stage unspecified: Secondary | ICD-10-CM | POA: Diagnosis not present

## 2014-10-14 ENCOUNTER — Ambulatory Visit (INDEPENDENT_AMBULATORY_CARE_PROVIDER_SITE_OTHER): Payer: Medicare Other | Admitting: Cardiovascular Disease

## 2014-10-14 ENCOUNTER — Encounter: Payer: Self-pay | Admitting: Cardiology

## 2014-10-14 VITALS — BP 124/68 | HR 93 | Ht 72.0 in | Wt 198.4 lb

## 2014-10-14 DIAGNOSIS — I739 Peripheral vascular disease, unspecified: Secondary | ICD-10-CM

## 2014-10-14 MED ORDER — PRAVASTATIN SODIUM 40 MG PO TABS: 40.0000 mg | ORAL_TABLET | Freq: Every evening | ORAL | Status: DC

## 2014-10-14 NOTE — Progress Notes (Addendum)
10/14/2014 Harry [name]   May 07, 1945  [id number]  Primary Physician [name], MD Primary Cardiologist: Dr. Gwenlyn Found Electrophysiologist: N/A  Reason for Visit/CC: F/U for PVD  HPI:  The patient is a 69 y/o AAM followed by Dr. Gwenlyn Found for PAD and critial limb ischemia. His cardiovascular risk factor profile is notable for 50-pack-years of tobacco abuse, currently smoking one pack per day. He also has h/o treated diabetes, hypertension and hyperlipidemia. Dopplers performed in our office 07/09/14 revealed ABIs in the 0.7 range with occluded SFAs bilaterally and tibial disease on the right. Dr. Gwenlyn Found performed peripheral angiography on him 08/29/14 revealing occluded SFAs bilaterally.The patient underwent directional atherectomy +  drug eluding balloon angioplasty on the left SFA with excellent angiographic and clinical result. He [facility] to [facility] on 09/20/14 to undergo staged right SFA intervention. This was treated with directional atherectomy + drug eluting balloon angioplasty. However, f/u dopplers did not show expected improvement on the right, with ABI remaining low in the 0.88 range.   He presents to clinic today for f/u. He notes some improvement but continues to have proximal bilateral leg weakness and low back pain. He is wanting to have back surgery. He denies CP and dyspnea. Unfortunately he has continued to smoke. He also self discontinued his Lipitor 2 days ago due to intolerance (myalgias). BP is well controlled today.    Current Outpatient Prescriptions  Medication Sig Dispense Refill  . aspirin EC 325 MG EC tablet Take 1 tablet (325 mg total) by mouth daily. 30 tablet 0  . atorvastatin (LIPITOR) 20 MG tablet Take 10 mg by mouth daily.     [name] Kitchen atorvastatin (LIPITOR) 40 MG tablet Take 1 tablet (40 mg total) by mouth daily. (Patient not taking: Reported on 10/09/2014) 30 tablet 6  . brimonidine (ALPHAGAN) 0.2 % ophthalmic solution Place 1 drop into both eyes 2 (two) times daily.     .  clopidogrel (PLAVIX) 75 MG tablet Take 1 tablet (75 mg total) by mouth daily with breakfast. 30 tablet 11  . dorzolamide-timolol (COSOPT) 22.3-6.8 MG/ML ophthalmic solution Place 1 drop into both eyes 2 (two) times daily.     [name] Kitchen gabapentin (NEURONTIN) 300 MG capsule Take 300 mg by mouth 3 (three) times daily.  1  . insulin glargine (LANTUS) 100 UNIT/ML injection Inject 40 Units into the skin daily.     [name] Kitchen losartan (COZAAR) 50 MG tablet Take 50 mg by mouth daily.     [name] Kitchen NOVOLOG 100 UNIT/ML injection USE 5 UNITS BY SUBCUTANEOUSLY WITH MEALS...  3  . ranitidine (ZANTAC) 150 MG tablet Take 150 mg by mouth daily as needed for heartburn.      No current facility-administered medications for this visit.    No Known Allergies  Social History   Social History  . Marital Status: Married    Spouse Name: N/A  . Number of Children: N/A  . Years of Education: N/A   Occupational History  . Not on file.   Social History Main Topics  . Smoking status: Current Every Day Smoker -- 1.00 packs/day for 45 years    Types: Cigarettes  . Smokeless tobacco: Never Used  . Alcohol Use: No  . Drug Use: No  . Sexual Activity: Not Currently   Other Topics Concern  . Not on file   Social History Narrative     Review of Systems: General: negative for chills, fever, night sweats or weight changes.  Cardiovascular: negative for chest pain, dyspnea on exertion, edema, orthopnea, palpitations,  paroxysmal nocturnal dyspnea or shortness of breath Dermatological: negative for rash Respiratory: negative for cough or wheezing Urologic: negative for hematuria Abdominal: negative for nausea, vomiting, diarrhea, bright red blood per rectum, melena, or hematemesis Neurologic: negative for visual changes, syncope, or dizziness All other systems reviewed and are otherwise negative except as noted above.    Blood pressure 124/68, pulse 93, height 6' (1.829 m), weight 198 lb 6.4 oz (89.994 kg).  General appearance:  alert, cooperative and no distress Neck: no carotid bruit and no JVD Lungs: clear to auscultation bilaterally Heart: regular rate and rhythm, S1, S2 normal, no murmur, click, rub or gallop Extremities: no LEE Pulses: 2+ DPs on the left, 1+ on the right Skin: warm and dry Neurologic: Grossly normal  EKG not performed  ASSESSMENT AND PLAN:   1. PVD: Bilateral SFA Disease. S/p staged bilateral directional atherectomy + DES to the left and right SFAs. F/U dopplers show improvement on the left with ABI increase from 0.7 to 1.1. However, he has less than expected results on the right. His right ABI improved only to 0.88. The results were reviewed by Dr. Gwenlyn Found. There is concern that he has developed another blockage/ has residual right sided SFA disease. We will plan to re-study with repeat angiogram and possible intervention next week. Given chronic renal insufficiency with baseline SCr~1.7, we will admit to [facility] the day prior for pre cath hydration. The patient is in agreement with plan. Continue DAPT with ASA + Plavix.  2. HLD: intolerant to Lipitor. Will try pravastatin 40 mg.  3. HTN: BP is well controlled.  4. Tobacco Abuse: smoking cessation strongly advised.   PLAN  Plan for repeat LE angiogram with Dr. Gwenlyn Found 10/18/14.  Harry Jester PA-C 10/14/2014 4:40 PM  Agree with note written by Ellen Henri  PAC  Pt s/p recent RSFA intervention now with residual high grade distal RSFA Dz. Will need re angio monday  Quay Burow 10/14/2014 7:13 PM

## 2014-10-14 NOTE — Patient Instructions (Signed)
Dr. Gwenlyn Found has ordered a peripheral angiogram to be done at [facility].  This procedure is going to look at the bloodflow in your lower extremities.  If Dr. Gwenlyn Found is able to open up the arteries, you will have to spend one night in the hospital.  If he is not able to open the arteries, you will be able to go home that same day.    After the procedure, you will not be allowed to drive for 3 days or push, pull, or lift anything greater than 10 lbs for one week.        *REPS Nicki Reaper  You will receive a phone call from [facility] the day before the procedure.  When they call you, please report to the hospital so that you can begin receiving IV fluids to prepare your kidneys for the upcoming catheterization.  If you have not received a phone call from the hospital by 2pm, please call our office and let us know.

## 2014-10-17 ENCOUNTER — Observation Stay (HOSPITAL_COMMUNITY)
Admission: RE | Admit: 2014-10-17 | Discharge: 2014-10-18 | Disposition: A | Discharge status: Home / Self Care | Payer: Medicare Other | Source: Ambulatory Visit | Attending: Cardiovascular Disease | Admitting: Cardiovascular Disease

## 2014-10-17 ENCOUNTER — Encounter (HOSPITAL_COMMUNITY): Payer: Self-pay | Admitting: Cardiology

## 2014-10-17 DIAGNOSIS — I129 Hypertensive chronic kidney disease with stage 1 through stage 4 chronic kidney disease, or unspecified chronic kidney disease: Secondary | ICD-10-CM | POA: Insufficient documentation

## 2014-10-17 DIAGNOSIS — E1151 Type 2 diabetes mellitus with diabetic peripheral angiopathy without gangrene: Principal | ICD-10-CM | POA: Insufficient documentation

## 2014-10-17 DIAGNOSIS — F17210 Nicotine dependence, cigarettes, uncomplicated: Secondary | ICD-10-CM | POA: Diagnosis not present

## 2014-10-17 DIAGNOSIS — N183 Chronic kidney disease, stage 3 (moderate): Secondary | ICD-10-CM | POA: Insufficient documentation

## 2014-10-17 DIAGNOSIS — Z8673 Personal history of transient ischemic attack (TIA), and cerebral infarction without residual deficits: Secondary | ICD-10-CM | POA: Insufficient documentation

## 2014-10-17 DIAGNOSIS — I70211 Atherosclerosis of native arteries of extremities with intermittent claudication, right leg: Secondary | ICD-10-CM | POA: Diagnosis not present

## 2014-10-17 DIAGNOSIS — E780 Pure hypercholesterolemia: Secondary | ICD-10-CM | POA: Insufficient documentation

## 2014-10-17 DIAGNOSIS — E1140 Type 2 diabetes mellitus with diabetic neuropathy, unspecified: Secondary | ICD-10-CM | POA: Insufficient documentation

## 2014-10-17 DIAGNOSIS — I739 Peripheral vascular disease, unspecified: Secondary | ICD-10-CM | POA: Diagnosis not present

## 2014-10-17 HISTORY — DX: Chronic kidney disease, stage 3 unspecified: N18.30

## 2014-10-17 HISTORY — DX: Tobacco use: Z72.0

## 2014-10-17 HISTORY — DX: Accidental discharge from unspecified firearms or gun, initial encounter: W34.00XA

## 2014-10-17 HISTORY — DX: Chronic kidney disease, stage 3 (moderate): N18.3

## 2014-10-17 LAB — CBC WITH DIFFERENTIAL/PLATELET
Basophils Absolute: 0.1 10*3/uL (ref 0.0–0.1)
Basophils Relative: 1 % (ref 0–1)
Eosinophils Absolute: 0.1 10*3/uL (ref 0.0–0.7)
Eosinophils Relative: 2 % (ref 0–5)
HCT: 32.6 % — ABNORMAL LOW (ref 39.0–52.0)
Hemoglobin: 10.7 g/dL — ABNORMAL LOW (ref 13.0–17.0)
Lymphocytes Relative: 27 % (ref 12–46)
Lymphs Abs: 1.8 10*3/uL (ref 0.7–4.0)
MCH: 31.6 pg (ref 26.0–34.0)
MCHC: 32.8 g/dL (ref 30.0–36.0)
MCV: 96.2 fL (ref 78.0–100.0)
Monocytes Absolute: 0.6 10*3/uL (ref 0.1–1.0)
Monocytes Relative: 8 % (ref 3–12)
Neutro Abs: 4.3 10*3/uL (ref 1.7–7.7)
Neutrophils Relative %: 62 % (ref 43–77)
Platelets: 263 10*3/uL (ref 150–400)
RBC: 3.39 MIL/uL — ABNORMAL LOW (ref 4.22–5.81)
RDW: 13.8 % (ref 11.5–15.5)
WBC: 6.8 10*3/uL (ref 4.0–10.5)

## 2014-10-17 LAB — BASIC METABOLIC PANEL
Anion gap: 6 (ref 5–15)
BUN: 25 mg/dL — ABNORMAL HIGH (ref 6–20)
CO2: 28 mmol/L (ref 22–32)
Calcium: 9.1 mg/dL (ref 8.9–10.3)
Chloride: 107 mmol/L (ref 101–111)
Creatinine, Ser: 1.97 mg/dL — ABNORMAL HIGH (ref 0.61–1.24)
GFR calc Af Amer: 39 mL/min — ABNORMAL LOW (ref >60)
GFR calc non Af Amer: 33 mL/min — ABNORMAL LOW (ref >60)
Glucose, Bld: 163 mg/dL — ABNORMAL HIGH (ref 65–99)
Potassium: 4.4 mmol/L (ref 3.5–5.1)
Sodium: 141 mmol/L (ref 135–145)

## 2014-10-17 LAB — GLUCOSE, CAPILLARY
Glucose-Capillary: 143 mg/dL — ABNORMAL HIGH (ref 65–99)
Glucose-Capillary: 130 mg/dL — ABNORMAL HIGH (ref 65–99)

## 2014-10-17 LAB — PROTIME-INR
INR: 1.1 (ref 0.00–1.49)
Prothrombin Time: 14.4 seconds (ref 11.6–15.2)

## 2014-10-17 MED ORDER — SODIUM CHLORIDE 0.9 % IV SOLN: 250.0000 mL | INTRAVENOUS | Status: DC | PRN

## 2014-10-17 MED ORDER — GABAPENTIN 300 MG PO CAPS
300.0000 mg | ORAL_CAPSULE | Freq: Three times a day (TID) | ORAL | Status: DC
  Administered 2014-10-17 – 2014-10-18 (×3): 300 mg via ORAL
  Filled 2014-10-17 (×5): qty 1

## 2014-10-17 MED ORDER — SODIUM CHLORIDE 0.9 % IJ SOLN: 3.0000 mL | Freq: Two times a day (BID) | INTRAMUSCULAR | Status: DC

## 2014-10-17 MED ORDER — ASPIRIN EC 81 MG PO TBEC: 81.0000 mg | DELAYED_RELEASE_TABLET | Freq: Every day | ORAL | Status: DC

## 2014-10-17 MED ORDER — DORZOLAMIDE HCL-TIMOLOL MAL 2-0.5 % OP SOLN
1.0000 [drp] | Freq: Two times a day (BID) | OPHTHALMIC | Status: DC
  Administered 2014-10-17 – 2014-10-18 (×2): 1 [drp] via OPHTHALMIC
  Filled 2014-10-17: qty 10

## 2014-10-17 MED ORDER — BRIMONIDINE TARTRATE 0.2 % OP SOLN
1.0000 [drp] | Freq: Two times a day (BID) | OPHTHALMIC | Status: DC
  Administered 2014-10-17 – 2014-10-18 (×2): 1 [drp] via OPHTHALMIC
  Filled 2014-10-17: qty 5

## 2014-10-17 MED ORDER — ZOLPIDEM TARTRATE 5 MG PO TABS: 5.0000 mg | ORAL_TABLET | Freq: Every evening | ORAL | Status: DC | PRN

## 2014-10-17 MED ORDER — FAMOTIDINE 20 MG PO TABS
20.0000 mg | ORAL_TABLET | Freq: Every day | ORAL | Status: DC
  Filled 2014-10-17: qty 1

## 2014-10-17 MED ORDER — SODIUM CHLORIDE 0.9 % IV SOLN
INTRAVENOUS | Status: DC
  Administered 2014-10-17: 18:00:00 via INTRAVENOUS

## 2014-10-17 MED ORDER — INSULIN ASPART 100 UNIT/ML ~~LOC~~ SOLN: 0.0000 [IU] | Freq: Every day | SUBCUTANEOUS | Status: DC

## 2014-10-17 MED ORDER — ACETAMINOPHEN 325 MG PO TABS: 650.0000 mg | ORAL_TABLET | ORAL | Status: DC | PRN

## 2014-10-17 MED ORDER — ATORVASTATIN CALCIUM 10 MG PO TABS
10.0000 mg | ORAL_TABLET | Freq: Every day | ORAL | Status: DC
  Filled 2014-10-17: qty 1

## 2014-10-17 MED ORDER — INSULIN GLARGINE 100 UNIT/ML ~~LOC~~ SOLN
40.0000 [IU] | Freq: Every day | SUBCUTANEOUS | Status: DC
  Filled 2014-10-17 (×2): qty 0.4

## 2014-10-17 MED ORDER — ALPRAZOLAM 0.25 MG PO TABS: 0.2500 mg | ORAL_TABLET | Freq: Two times a day (BID) | ORAL | Status: DC | PRN

## 2014-10-17 MED ORDER — HEPARIN SODIUM (PORCINE) 5000 UNIT/ML IJ SOLN
5000.0000 [IU] | Freq: Three times a day (TID) | INTRAMUSCULAR | Status: DC
  Administered 2014-10-17 – 2014-10-18 (×2): 5000 [IU] via SUBCUTANEOUS
  Filled 2014-10-17 (×5): qty 1

## 2014-10-17 MED ORDER — ONDANSETRON HCL 4 MG/2ML IJ SOLN: 4.0000 mg | Freq: Four times a day (QID) | INTRAMUSCULAR | Status: DC | PRN

## 2014-10-17 MED ORDER — INSULIN ASPART 100 UNIT/ML ~~LOC~~ SOLN
0.0000 [IU] | Freq: Three times a day (TID) | SUBCUTANEOUS | Status: DC
  Administered 2014-10-17: 2 [IU] via SUBCUTANEOUS

## 2014-10-17 MED ORDER — CLOPIDOGREL BISULFATE 75 MG PO TABS
75.0000 mg | ORAL_TABLET | Freq: Every day | ORAL | Status: DC
  Administered 2014-10-18: 75 mg via ORAL
  Filled 2014-10-17 (×2): qty 1

## 2014-10-17 MED ORDER — INSULIN GLARGINE 100 UNIT/ML ~~LOC~~ SOLN
20.0000 [IU] | Freq: Once | SUBCUTANEOUS | Status: AC
  Administered 2014-10-17: 20 [IU] via SUBCUTANEOUS
  Filled 2014-10-17: qty 0.2

## 2014-10-17 MED ORDER — ASPIRIN 81 MG PO CHEW
81.0000 mg | CHEWABLE_TABLET | ORAL | Status: AC
  Administered 2014-10-18: 81 mg via ORAL
  Filled 2014-10-17: qty 1

## 2014-10-17 MED ORDER — SODIUM CHLORIDE 0.9 % IJ SOLN: 3.0000 mL | INTRAMUSCULAR | Status: DC | PRN

## 2014-10-17 MED ORDER — NITROGLYCERIN 0.4 MG SL SUBL: 0.4000 mg | SUBLINGUAL_TABLET | SUBLINGUAL | Status: DC | PRN
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
## 2014-10-17 NOTE — Progress Notes (Signed)
Pt reports rash with Pravachol- will DC.   Kerin Ransom PA-C 10/17/2014 4:23 PM   Agree with note written by Kerin Ransom PAC  Quay Burow 10/18/2014 12:03 PM

## 2014-10-18 ENCOUNTER — Encounter (HOSPITAL_COMMUNITY)
Admission: RE | Disposition: A | Discharge status: Home / Self Care | Payer: Self-pay | Source: Ambulatory Visit | Attending: Cardiovascular Disease

## 2014-10-18 ENCOUNTER — Encounter (HOSPITAL_COMMUNITY): Payer: Self-pay | Admitting: Physician Assistant

## 2014-10-18 DIAGNOSIS — I70211 Atherosclerosis of native arteries of extremities with intermittent claudication, right leg: Secondary | ICD-10-CM | POA: Diagnosis not present

## 2014-10-18 DIAGNOSIS — E1151 Type 2 diabetes mellitus with diabetic peripheral angiopathy without gangrene: Secondary | ICD-10-CM | POA: Diagnosis not present

## 2014-10-18 DIAGNOSIS — Z8673 Personal history of transient ischemic attack (TIA), and cerebral infarction without residual deficits: Secondary | ICD-10-CM | POA: Diagnosis not present

## 2014-10-18 DIAGNOSIS — N183 Chronic kidney disease, stage 3 (moderate): Secondary | ICD-10-CM | POA: Diagnosis not present

## 2014-10-18 DIAGNOSIS — E1140 Type 2 diabetes mellitus with diabetic neuropathy, unspecified: Secondary | ICD-10-CM | POA: Diagnosis not present

## 2014-10-18 DIAGNOSIS — I129 Hypertensive chronic kidney disease with stage 1 through stage 4 chronic kidney disease, or unspecified chronic kidney disease: Secondary | ICD-10-CM | POA: Diagnosis not present

## 2014-10-18 HISTORY — PX: LOWER EXTREMITY ANGIOGRAM: SHX5508

## 2014-10-18 LAB — BASIC METABOLIC PANEL
Anion gap: 6 (ref 5–15)
BUN: 20 mg/dL (ref 6–20)
CO2: 26 mmol/L (ref 22–32)
Calcium: 8.4 mg/dL — ABNORMAL LOW (ref 8.9–10.3)
Chloride: 107 mmol/L (ref 101–111)
Creatinine, Ser: 1.56 mg/dL — ABNORMAL HIGH (ref 0.61–1.24)
GFR calc Af Amer: 51 mL/min — ABNORMAL LOW (ref >60)
GFR calc non Af Amer: 44 mL/min — ABNORMAL LOW (ref >60)
Glucose, Bld: 148 mg/dL — ABNORMAL HIGH (ref 65–99)
Potassium: 3.6 mmol/L (ref 3.5–5.1)
Sodium: 139 mmol/L (ref 135–145)

## 2014-10-18 LAB — GLUCOSE, CAPILLARY
Glucose-Capillary: 132 mg/dL — ABNORMAL HIGH (ref 65–99)
Glucose-Capillary: 103 mg/dL — ABNORMAL HIGH (ref 65–99)
Glucose-Capillary: 87 mg/dL (ref 65–99)
Glucose-Capillary: 113 mg/dL — ABNORMAL HIGH (ref 65–99)

## 2014-10-18 SURGERY — ANGIOGRAM, LOWER EXTREMITY
Laterality: Right

## 2014-10-18 MED ORDER — SODIUM CHLORIDE 0.9 % IV SOLN: INTRAVENOUS | Status: AC

## 2014-10-18 MED ORDER — LIDOCAINE HCL (PF) 1 % IJ SOLN
INTRAMUSCULAR | Status: DC | PRN
  Administered 2014-10-18: 30 mL

## 2014-10-18 MED ORDER — ACETAMINOPHEN 325 MG PO TABS: 650.0000 mg | ORAL_TABLET | ORAL | Status: DC | PRN

## 2014-10-18 MED ORDER — LOSARTAN POTASSIUM 50 MG PO TABS
50.0000 mg | ORAL_TABLET | Freq: Every day | ORAL | Status: DC
  Administered 2014-10-18: 50 mg via ORAL
  Filled 2014-10-18: qty 1

## 2014-10-18 MED ORDER — LIDOCAINE HCL (PF) 1 % IJ SOLN
INTRAMUSCULAR | Status: AC
  Filled 2014-10-18: qty 30

## 2014-10-18 MED ORDER — MIDAZOLAM HCL 2 MG/2ML IJ SOLN
INTRAMUSCULAR | Status: AC
  Filled 2014-10-18: qty 4

## 2014-10-18 MED ORDER — ONDANSETRON HCL 4 MG/2ML IJ SOLN: 4.0000 mg | Freq: Four times a day (QID) | INTRAMUSCULAR | Status: DC | PRN

## 2014-10-18 MED ORDER — MORPHINE SULFATE 2 MG/ML IJ SOLN: 2.0000 mg | INTRAMUSCULAR | Status: DC | PRN

## 2014-10-18 MED ORDER — DIPHENHYDRAMINE HCL 25 MG PO CAPS
25.0000 mg | ORAL_CAPSULE | Freq: Four times a day (QID) | ORAL | Status: DC | PRN
  Administered 2014-10-18: 25 mg via ORAL
  Filled 2014-10-18: qty 1

## 2014-10-18 MED ORDER — FENTANYL CITRATE (PF) 100 MCG/2ML IJ SOLN
INTRAMUSCULAR | Status: DC | PRN
  Administered 2014-10-18: 25 ug via INTRAVENOUS

## 2014-10-18 MED ORDER — CLOPIDOGREL BISULFATE 75 MG PO TABS
75.0000 mg | ORAL_TABLET | Freq: Every day | ORAL | Status: DC
Start: 2014-10-19 — End: 2014-10-18
  Filled 2014-10-18: qty 1

## 2014-10-18 MED ORDER — MIDAZOLAM HCL 2 MG/2ML IJ SOLN
INTRAMUSCULAR | Status: DC | PRN
  Administered 2014-10-18: 1 mg via INTRAVENOUS

## 2014-10-18 MED ORDER — FENTANYL CITRATE (PF) 100 MCG/2ML IJ SOLN
INTRAMUSCULAR | Status: AC
Start: 2014-10-18 — End: 2014-10-18
  Filled 2014-10-18: qty 4

## 2014-10-18 MED ORDER — HEPARIN (PORCINE) IN NACL 2-0.9 UNIT/ML-% IJ SOLN
INTRAMUSCULAR | Status: AC
  Filled 2014-10-18: qty 1000

## 2014-10-18 MED ORDER — ASPIRIN EC 325 MG PO TBEC
325.0000 mg | DELAYED_RELEASE_TABLET | Freq: Every day | ORAL | Status: DC
  Administered 2014-10-18: 325 mg via ORAL
  Filled 2014-10-18: qty 1

## 2014-10-18 MED ORDER — ASPIRIN 81 MG PO TBEC: 81.0000 mg | DELAYED_RELEASE_TABLET | Freq: Every day | ORAL | Status: DC

## 2014-10-18 SURGICAL SUPPLY — 8 items
CATH CROSS OVER TEMPO 5F (CATHETERS) ×3 IMPLANT
CATH STRAIGHT 5FR 65CM (CATHETERS) ×3 IMPLANT
KIT PV (KITS) ×4 IMPLANT
SHEATH PINNACLE 5F 10CM (SHEATH) ×3 IMPLANT
SYR MEDRAD MARK V 150ML (SYRINGE) ×4 IMPLANT
TRANSDUCER W/STOPCOCK (MISCELLANEOUS) ×4 IMPLANT
TRAY PV CATH (CUSTOM PROCEDURE TRAY) ×4 IMPLANT
WIRE HITORQ VERSACORE ST 145CM (WIRE) ×3 IMPLANT

## 2014-10-18 NOTE — Progress Notes (Signed)
Patient c/o of itching an has welts on right lower back and buttock and positioner thigh. Paged Dr. Wynonia Lawman on call for cardiology awaiting return call.

## 2014-10-18 NOTE — Interval H&P Note (Signed)
History and Physical Interval Note:  10/18/2014 7:43 AM  Harry [name]  has presented today for surgery, with the diagnosis of pad  The various methods of treatment have been discussed with the patient and family. After consideration of risks, benefits and other options for treatment, the patient has consented to  Procedure(s): Abdominal Aortogram w/Lower Extremity (N/A) as a surgical intervention .  The patient's history has been reviewed, patient examined, no change in status, stable for surgery.  I have reviewed the patient's chart and labs.  Questions were answered to the patient's satisfaction.     Quay Burow

## 2014-10-18 NOTE — Progress Notes (Signed)
Site area: lt groin fa sheath  Site Prior to Removal:  Level   0 Pressure Applied For:   20 minutes   Manual:   yes Patient Status During Pull:  stable Post Pull Site:  Level   0 Post Pull Instructions Given:  yes Post Pull Pulses Present:   yes Dressing Applied:  tegaderm Bedrest begins @   [id number] Comments:   0
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
## 2014-10-19 ENCOUNTER — Telehealth: Payer: Self-pay | Admitting: Cardiovascular Disease

## 2014-10-19 NOTE — Telephone Encounter (Signed)
D/c phone call . appt on 12/08/14 w/ Dr.Berry

## 2014-10-19 NOTE — Telephone Encounter (Signed)
TOC call placed.  [place] 4:42pm

## 2014-10-20 ENCOUNTER — Telehealth: Payer: Self-pay | Admitting: Cardiovascular Disease

## 2014-10-20 NOTE — Telephone Encounter (Signed)
Refer to [facility] for [id number]

## 2014-10-20 NOTE — Telephone Encounter (Signed)
LMTCB  Per note, patient was taking off lipitor due to myalgias and developed rash with pravastatin   Routed to MD to review and advise on statin therapy

## 2014-10-20 NOTE — Telephone Encounter (Signed)
Pt was taken off his Cholesterol medicine,he is waiting to hear what to dol

## 2014-10-21 ENCOUNTER — Telehealth: Payer: Self-pay | Admitting: Cardiovascular Disease

## 2014-10-21 NOTE — Telephone Encounter (Signed)
Closed encounter °

## 2014-10-21 NOTE — Telephone Encounter (Signed)
Patient contacted regarding discharge from [facility] on 10/18/2014.  Patient understands to follow up with provider Dr. Gwenlyn Found in October at [facility] location. Patient understands discharge instructions? yes Patient understands medications and regiment? yes Patient understands to bring all medications to this visit? yes  Statin drug was discontinued during hospitalization.  Asking what he should do.  Erasmo Downer, PharmD will make him an appt to go over all meds in the near future.

## 2014-10-21 NOTE — Telephone Encounter (Signed)
Harry [name]  Please schedule him a 30 minute OV with me for lipid clinic.  Thx

## 2014-10-25 ENCOUNTER — Other Ambulatory Visit: Payer: Self-pay | Admitting: *Deleted

## 2014-10-25 MED ORDER — ATORVASTATIN CALCIUM 20 MG PO TABS: 20.0000 mg | ORAL_TABLET | Freq: Every day | ORAL | Status: DC

## 2014-10-28 ENCOUNTER — Ambulatory Visit (INDEPENDENT_AMBULATORY_CARE_PROVIDER_SITE_OTHER): Payer: Medicare Other | Admitting: Pharmacist Clinician (PhC)/ Clinical Pharmacy Specialist

## 2014-10-28 VITALS — Ht 72.0 in

## 2014-10-28 DIAGNOSIS — E785 Hyperlipidemia, unspecified: Secondary | ICD-10-CM

## 2014-10-28 NOTE — Assessment & Plan Note (Addendum)
Pt with severe PAD and history of ischemic stroke with LDL of 119.  He has tried multiple statins but developed myalgias as well as hives around his scalp.  He has no knowledge of trying Zetia.  His previous LDL was very good at 56, but this was 3 years ago, on simvastatin, before the myalgias developed.  Today we will start Zetia 10 mg once daily.  Patient has been frustrated with purchasing medications then being unable to take, so I gave him 3 weeks of samples to try the Zetia.  He is to call before the samples run out and we will send in prescription to his pharmacy.  Repeat labs in 3 months and have patient see me afterward.  If LDL not close to goal may consider PCSK-9 inhibitor at that time.    Spoke about his tobacco habit, patient states he quit once, but then was offered a cigarette and now is back to a pack per day.  States he would like to cut back, so I encouraged him to limit himself to 3/4 pack per day for a month then decrease to 1/2 pack.

## 2014-10-28 NOTE — Patient Instructions (Signed)
Start Zetia 10 mg once daily.  You can take it with food if needed  If you tolerate this, please call me in 2-3 weeks and we will see about getting it covered on your insurance  Will repeat labs in 3 months and see me about 1 week later  Cholesterol Cholesterol is a fat. Your body needs a small amount of cholesterol. Cholesterol may build up in your blood vessels. This increases your chance of having a heart attack or stroke. You cannot feel your cholesterol levels. The only way to know your cholesterol level is high is with a blood test. Keep your test results. Work with your doctor to keep your cholesterol at a good level. WHAT DO THE TEST RESULTS MEAN?  Total cholesterol is how much cholesterol is in your blood.  LDL is bad cholesterol. This is the type that can build up. You want LDL to be low.  HDL is good cholesterol. It cleans your blood vessels and carries LDL away. You want HDL to be high.  Triglycerides are fat that the body can burn for energy or store. WHAT ARE GOOD LEVELS OF CHOLESTEROL?  Total cholesterol below 200.  LDL below 100 for people at risk. Below 70 for those at very high risk.  HDL above 50 is good. Above 60 is best.  Triglycerides below 150. HOW CAN I LOWER MY CHOLESTEROL?  Diet. Follow your diet programs as told by your doctor.  Choose fish, white meat chicken, roasted [place], or baked [place]. Try not to eat red meat, fried foods, or processed meats such as sausage and lunch meats.  Eat lots of fresh fruits and vegetables.  Choose whole grains, beans, pasta, potatoes, and cereals.  Use only small amounts of olive, corn, or canola oils.  Try not to eat butter, mayonnaise, shortening, or palm kernel oils.  Try not to eat foods with trans fats.  Drink skim or nonfat milk. Eat low-fat or nonfat yogurt and cheeses. Try not to drink whole milk or cream. Try not to eat ice cream, egg yolks, and full-fat cheeses.  Healthy desserts include angel food  cake, ginger snaps, animal crackers, hard candy, popsicles, and low-fat or nonfat frozen yogurt. Try not to eat pastries, cakes, pies, and cookies.  Exercise. Follow your exercise programs as told by your doctor.  Be more active. You can try gardening, walking, or taking the stairs. Ask your doctor about how you can be more active.  Medicine. Take medicine as told by your doctor. Document Released: 05/18/2008 Document Revised: 07/06/2013 Document Reviewed: 12/03/2012 [facility] Patient Information 2015 [place], [place]. This information is not intended to replace advice given to you by your health care provider. Make sure you discuss any questions you have with your health care provider.
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
## 2014-10-30 ENCOUNTER — Encounter: Payer: Self-pay | Admitting: Pharmacist Clinician (PhC)/ Clinical Pharmacy Specialist

## 2014-11-09 DIAGNOSIS — H4011X3 Primary open-angle glaucoma, severe stage: Secondary | ICD-10-CM | POA: Diagnosis not present

## 2014-11-09 DIAGNOSIS — H18232 Secondary corneal edema, left eye: Secondary | ICD-10-CM | POA: Diagnosis not present

## 2014-11-09 DIAGNOSIS — H40011 Open angle with borderline findings, low risk, right eye: Secondary | ICD-10-CM | POA: Diagnosis not present

## 2014-11-15 DIAGNOSIS — E785 Hyperlipidemia, unspecified: Secondary | ICD-10-CM | POA: Diagnosis not present

## 2014-11-15 DIAGNOSIS — Z79899 Other long term (current) drug therapy: Secondary | ICD-10-CM | POA: Diagnosis not present

## 2014-11-15 DIAGNOSIS — Z961 Presence of intraocular lens: Secondary | ICD-10-CM | POA: Diagnosis not present

## 2014-11-15 DIAGNOSIS — I739 Peripheral vascular disease, unspecified: Secondary | ICD-10-CM | POA: Diagnosis not present

## 2014-11-15 DIAGNOSIS — K5900 Constipation, unspecified: Secondary | ICD-10-CM | POA: Diagnosis not present

## 2014-11-15 DIAGNOSIS — H4011X3 Primary open-angle glaucoma, severe stage: Secondary | ICD-10-CM | POA: Diagnosis not present

## 2014-11-15 DIAGNOSIS — Z8673 Personal history of transient ischemic attack (TIA), and cerebral infarction without residual deficits: Secondary | ICD-10-CM | POA: Diagnosis not present

## 2014-11-15 DIAGNOSIS — Z7982 Long term (current) use of aspirin: Secondary | ICD-10-CM | POA: Diagnosis not present

## 2014-11-15 DIAGNOSIS — R0683 Snoring: Secondary | ICD-10-CM | POA: Diagnosis not present

## 2014-11-15 DIAGNOSIS — K219 Gastro-esophageal reflux disease without esophagitis: Secondary | ICD-10-CM | POA: Diagnosis not present

## 2014-11-15 DIAGNOSIS — H4089 Other specified glaucoma: Secondary | ICD-10-CM | POA: Diagnosis not present

## 2014-11-15 DIAGNOSIS — E119 Type 2 diabetes mellitus without complications: Secondary | ICD-10-CM | POA: Diagnosis not present

## 2014-11-15 DIAGNOSIS — F17210 Nicotine dependence, cigarettes, uncomplicated: Secondary | ICD-10-CM | POA: Diagnosis not present

## 2014-11-15 DIAGNOSIS — M4806 Spinal stenosis, lumbar region: Secondary | ICD-10-CM | POA: Diagnosis not present

## 2014-11-15 DIAGNOSIS — Z7902 Long term (current) use of antithrombotics/antiplatelets: Secondary | ICD-10-CM | POA: Diagnosis not present

## 2014-11-15 DIAGNOSIS — Z791 Long term (current) use of non-steroidal anti-inflammatories (NSAID): Secondary | ICD-10-CM | POA: Diagnosis not present

## 2014-11-15 DIAGNOSIS — I10 Essential (primary) hypertension: Secondary | ICD-10-CM | POA: Diagnosis not present

## 2014-11-17 ENCOUNTER — Telehealth: Payer: Self-pay | Admitting: Pharmacist Clinician (PhC)/ Clinical Pharmacy Specialist

## 2014-11-17 MED ORDER — EZETIMIBE 10 MG PO TABS: 10.0000 mg | ORAL_TABLET | Freq: Every day | ORAL | Status: DC

## 2014-11-17 NOTE — Telephone Encounter (Signed)
Pt spouse calling for prescription, pt reports no side effects from samples given

## 2014-11-23 DIAGNOSIS — M549 Dorsalgia, unspecified: Secondary | ICD-10-CM | POA: Diagnosis not present

## 2014-11-26 ENCOUNTER — Other Ambulatory Visit (HOSPITAL_COMMUNITY): Payer: Self-pay | Admitting: Neurosurgery

## 2014-11-26 DIAGNOSIS — M549 Dorsalgia, unspecified: Secondary | ICD-10-CM

## 2014-11-29 DIAGNOSIS — H1812 Bullous keratopathy, left eye: Secondary | ICD-10-CM | POA: Diagnosis not present

## 2014-11-29 DIAGNOSIS — Z961 Presence of intraocular lens: Secondary | ICD-10-CM | POA: Diagnosis not present

## 2014-12-03 ENCOUNTER — Ambulatory Visit (HOSPITAL_COMMUNITY)
Admission: RE | Admit: 2014-12-03 | Discharge: 2014-12-03 | Disposition: A | Discharge status: Home / Self Care | Payer: Medicare Other | Source: Ambulatory Visit | Attending: Neurosurgery | Admitting: Neurosurgery

## 2014-12-03 ENCOUNTER — Ambulatory Visit (HOSPITAL_COMMUNITY): Payer: Medicare Other

## 2014-12-03 DIAGNOSIS — R531 Weakness: Secondary | ICD-10-CM | POA: Diagnosis not present

## 2014-12-03 DIAGNOSIS — M549 Dorsalgia, unspecified: Secondary | ICD-10-CM

## 2014-12-03 DIAGNOSIS — M47896 Other spondylosis, lumbar region: Secondary | ICD-10-CM | POA: Diagnosis not present

## 2014-12-03 DIAGNOSIS — M5137 Other intervertebral disc degeneration, lumbosacral region: Secondary | ICD-10-CM | POA: Insufficient documentation

## 2014-12-03 DIAGNOSIS — M545 Low back pain: Secondary | ICD-10-CM | POA: Diagnosis not present

## 2014-12-03 DIAGNOSIS — M5136 Other intervertebral disc degeneration, lumbar region: Secondary | ICD-10-CM | POA: Diagnosis not present

## 2014-12-03 MED ORDER — SODIUM CHLORIDE 0.9 % IJ SOLN
INTRAMUSCULAR | Status: AC
  Filled 2014-12-03: qty 50

## 2014-12-03 MED ORDER — GADOBENATE DIMEGLUMINE 529 MG/ML IV SOLN
19.0000 mL | Freq: Once | INTRAVENOUS | Status: AC | PRN
  Administered 2014-12-03: 19 mL via INTRAVENOUS

## 2014-12-08 ENCOUNTER — Ambulatory Visit: Payer: Medicare Other | Admitting: Cardiovascular Disease

## 2014-12-09 DIAGNOSIS — Z6826 Body mass index (BMI) 26.0-26.9, adult: Secondary | ICD-10-CM | POA: Diagnosis not present

## 2014-12-09 DIAGNOSIS — I10 Essential (primary) hypertension: Secondary | ICD-10-CM | POA: Diagnosis not present

## 2014-12-09 DIAGNOSIS — M549 Dorsalgia, unspecified: Secondary | ICD-10-CM | POA: Diagnosis not present

## 2014-12-27 DIAGNOSIS — M461 Sacroiliitis, not elsewhere classified: Secondary | ICD-10-CM | POA: Diagnosis not present

## 2014-12-27 DIAGNOSIS — I10 Essential (primary) hypertension: Secondary | ICD-10-CM | POA: Diagnosis not present

## 2014-12-27 DIAGNOSIS — Z6826 Body mass index (BMI) 26.0-26.9, adult: Secondary | ICD-10-CM | POA: Diagnosis not present

## 2014-12-27 DIAGNOSIS — N289 Disorder of kidney and ureter, unspecified: Secondary | ICD-10-CM | POA: Diagnosis not present

## 2014-12-27 DIAGNOSIS — I739 Peripheral vascular disease, unspecified: Secondary | ICD-10-CM | POA: Diagnosis not present

## 2014-12-27 DIAGNOSIS — M47816 Spondylosis without myelopathy or radiculopathy, lumbar region: Secondary | ICD-10-CM | POA: Diagnosis not present

## 2014-12-27 DIAGNOSIS — M4806 Spinal stenosis, lumbar region: Secondary | ICD-10-CM | POA: Diagnosis not present

## 2014-12-28 DIAGNOSIS — Z6826 Body mass index (BMI) 26.0-26.9, adult: Secondary | ICD-10-CM | POA: Diagnosis not present

## 2014-12-28 DIAGNOSIS — M47816 Spondylosis without myelopathy or radiculopathy, lumbar region: Secondary | ICD-10-CM | POA: Diagnosis not present

## 2014-12-30 DIAGNOSIS — E785 Hyperlipidemia, unspecified: Secondary | ICD-10-CM | POA: Diagnosis not present

## 2014-12-30 DIAGNOSIS — I739 Peripheral vascular disease, unspecified: Secondary | ICD-10-CM | POA: Diagnosis not present

## 2014-12-30 DIAGNOSIS — E1165 Type 2 diabetes mellitus with hyperglycemia: Secondary | ICD-10-CM | POA: Diagnosis not present

## 2014-12-30 DIAGNOSIS — F17200 Nicotine dependence, unspecified, uncomplicated: Secondary | ICD-10-CM | POA: Diagnosis not present

## 2015-01-05 ENCOUNTER — Ambulatory Visit: Payer: Medicare Other | Admitting: Cardiovascular Disease

## 2015-01-26 DIAGNOSIS — I10 Essential (primary) hypertension: Secondary | ICD-10-CM | POA: Diagnosis not present

## 2015-01-26 DIAGNOSIS — M47816 Spondylosis without myelopathy or radiculopathy, lumbar region: Secondary | ICD-10-CM | POA: Diagnosis not present

## 2015-01-26 DIAGNOSIS — M5416 Radiculopathy, lumbar region: Secondary | ICD-10-CM | POA: Diagnosis not present

## 2015-01-26 DIAGNOSIS — Z6826 Body mass index (BMI) 26.0-26.9, adult: Secondary | ICD-10-CM | POA: Diagnosis not present

## 2015-02-22 ENCOUNTER — Other Ambulatory Visit: Payer: Self-pay | Admitting: Cardiovascular Disease

## 2015-02-22 DIAGNOSIS — E785 Hyperlipidemia, unspecified: Secondary | ICD-10-CM | POA: Diagnosis not present

## 2015-02-22 LAB — LIPID PANEL
Cholesterol: 189 mg/dL (ref 125–200)
HDL: 30 mg/dL — ABNORMAL LOW (ref >=40)
LDL Cholesterol: 136 mg/dL — ABNORMAL HIGH (ref <130)
Total CHOL/HDL Ratio: 6.3 Ratio — ABNORMAL HIGH (ref <=5.0)
Triglycerides: 114 mg/dL (ref <150)
VLDL: 23 mg/dL (ref <30)

## 2015-02-22 LAB — HEPATIC FUNCTION PANEL
ALT: 9 U/L (ref 9–46)
AST: 14 U/L (ref 10–35)
Albumin: 4 g/dL (ref 3.6–5.1)
Alkaline Phosphatase: 54 U/L (ref 40–115)
Bilirubin, Direct: 0.1 mg/dL (ref <=0.2)
Indirect Bilirubin: 0.8 mg/dL (ref 0.2–1.2)
Total Bilirubin: 0.9 mg/dL (ref 0.2–1.2)
Total Protein: 6.9 g/dL (ref 6.1–8.1)

## 2015-02-25 ENCOUNTER — Telehealth: Payer: Self-pay

## 2015-02-25 DIAGNOSIS — E785 Hyperlipidemia, unspecified: Secondary | ICD-10-CM

## 2015-02-25 MED ORDER — ROSUVASTATIN CALCIUM 5 MG PO TABS: ORAL_TABLET | ORAL | Status: DC

## 2015-02-25 NOTE — Telephone Encounter (Signed)
-----   Message from Lorretta Harp, MD sent at 02/23/2015  8:30 PM EST ----- Lipid profile worse. Is her taking his atorva? If not start, if so, double dose and re check

## 2015-02-25 NOTE — Telephone Encounter (Signed)
Pt stated he is not taking any type of medication for cholesterol as he experienced allergic reatcions.  Pt not taking Zetia as when sample ran out it was to expensive to maintain. Pt only taking Plavix 75 mg QD, Losartan 25 mg QD, ad ASA 81 mg QD.   Spoke with PharmD, pt can take 5 mg of Crestor TWICE WEEKLY (MOnday and Fridays) and we will recheck in 2 months.  Shared orders and results with pt and wife and verbalized understanding, no questions at this time.  Medication sent to preferred pharmacy and lab slips mailed to pt for end of Feb. Lab recheck.

## 2015-03-15 ENCOUNTER — Ambulatory Visit: Payer: Medicare Other | Admitting: Pharmacist Clinician (PhC)/ Clinical Pharmacy Specialist

## 2015-03-24 ENCOUNTER — Ambulatory Visit: Payer: Medicare Other | Admitting: Pharmacist Clinician (PhC)/ Clinical Pharmacy Specialist

## 2015-03-25 ENCOUNTER — Encounter: Payer: Self-pay | Admitting: *Deleted

## 2015-03-25 DIAGNOSIS — Z006 Encounter for examination for normal comparison and control in clinical research program: Secondary | ICD-10-CM

## 2015-03-25 NOTE — Progress Notes (Signed)
Called Harry [name] for his 6 month SAFE-DCB follow-up. He states he has been doing well and has had no additional peripheral vascular interventions to his right leg. I will follow-up again in 6 months.

## 2015-03-31 DIAGNOSIS — E785 Hyperlipidemia, unspecified: Secondary | ICD-10-CM | POA: Diagnosis not present

## 2015-03-31 DIAGNOSIS — Z0000 Encounter for general adult medical examination without abnormal findings: Secondary | ICD-10-CM | POA: Diagnosis not present

## 2015-03-31 DIAGNOSIS — E1165 Type 2 diabetes mellitus with hyperglycemia: Secondary | ICD-10-CM | POA: Diagnosis not present

## 2015-03-31 DIAGNOSIS — M5126 Other intervertebral disc displacement, lumbar region: Secondary | ICD-10-CM | POA: Diagnosis not present

## 2015-03-31 DIAGNOSIS — I10 Essential (primary) hypertension: Secondary | ICD-10-CM | POA: Diagnosis not present

## 2015-04-12 DIAGNOSIS — H40011 Open angle with borderline findings, low risk, right eye: Secondary | ICD-10-CM | POA: Diagnosis not present

## 2015-04-12 DIAGNOSIS — H401123 Primary open-angle glaucoma, left eye, severe stage: Secondary | ICD-10-CM | POA: Diagnosis not present

## 2015-04-12 DIAGNOSIS — E113591 Type 2 diabetes mellitus with proliferative diabetic retinopathy without macular edema, right eye: Secondary | ICD-10-CM | POA: Diagnosis not present

## 2015-04-12 DIAGNOSIS — Z961 Presence of intraocular lens: Secondary | ICD-10-CM | POA: Diagnosis not present

## 2015-05-05 DIAGNOSIS — M9905 Segmental and somatic dysfunction of pelvic region: Secondary | ICD-10-CM | POA: Diagnosis not present

## 2015-05-05 DIAGNOSIS — M9903 Segmental and somatic dysfunction of lumbar region: Secondary | ICD-10-CM | POA: Diagnosis not present

## 2015-05-05 DIAGNOSIS — M4806 Spinal stenosis, lumbar region: Secondary | ICD-10-CM | POA: Diagnosis not present

## 2015-05-05 DIAGNOSIS — M9902 Segmental and somatic dysfunction of thoracic region: Secondary | ICD-10-CM | POA: Diagnosis not present

## 2015-05-10 DIAGNOSIS — M9905 Segmental and somatic dysfunction of pelvic region: Secondary | ICD-10-CM | POA: Diagnosis not present

## 2015-05-10 DIAGNOSIS — M9903 Segmental and somatic dysfunction of lumbar region: Secondary | ICD-10-CM | POA: Diagnosis not present

## 2015-05-10 DIAGNOSIS — M9902 Segmental and somatic dysfunction of thoracic region: Secondary | ICD-10-CM | POA: Diagnosis not present

## 2015-05-10 DIAGNOSIS — M4806 Spinal stenosis, lumbar region: Secondary | ICD-10-CM | POA: Diagnosis not present

## 2015-05-13 DIAGNOSIS — M9905 Segmental and somatic dysfunction of pelvic region: Secondary | ICD-10-CM | POA: Diagnosis not present

## 2015-05-13 DIAGNOSIS — M9902 Segmental and somatic dysfunction of thoracic region: Secondary | ICD-10-CM | POA: Diagnosis not present

## 2015-05-13 DIAGNOSIS — M9903 Segmental and somatic dysfunction of lumbar region: Secondary | ICD-10-CM | POA: Diagnosis not present

## 2015-05-13 DIAGNOSIS — M4806 Spinal stenosis, lumbar region: Secondary | ICD-10-CM | POA: Diagnosis not present

## 2015-05-20 DIAGNOSIS — M9903 Segmental and somatic dysfunction of lumbar region: Secondary | ICD-10-CM | POA: Diagnosis not present

## 2015-05-20 DIAGNOSIS — M9902 Segmental and somatic dysfunction of thoracic region: Secondary | ICD-10-CM | POA: Diagnosis not present

## 2015-05-20 DIAGNOSIS — M9905 Segmental and somatic dysfunction of pelvic region: Secondary | ICD-10-CM | POA: Diagnosis not present

## 2015-05-20 DIAGNOSIS — M4806 Spinal stenosis, lumbar region: Secondary | ICD-10-CM | POA: Diagnosis not present

## 2015-05-31 DIAGNOSIS — Z83511 Family history of glaucoma: Secondary | ICD-10-CM | POA: Diagnosis not present

## 2015-05-31 DIAGNOSIS — Z961 Presence of intraocular lens: Secondary | ICD-10-CM | POA: Diagnosis not present

## 2015-05-31 DIAGNOSIS — H401123 Primary open-angle glaucoma, left eye, severe stage: Secondary | ICD-10-CM | POA: Diagnosis not present

## 2015-05-31 DIAGNOSIS — E113591 Type 2 diabetes mellitus with proliferative diabetic retinopathy without macular edema, right eye: Secondary | ICD-10-CM | POA: Diagnosis not present

## 2015-05-31 DIAGNOSIS — Z79899 Other long term (current) drug therapy: Secondary | ICD-10-CM | POA: Diagnosis not present

## 2015-05-31 DIAGNOSIS — Z7902 Long term (current) use of antithrombotics/antiplatelets: Secondary | ICD-10-CM | POA: Diagnosis not present

## 2015-05-31 DIAGNOSIS — H409 Unspecified glaucoma: Secondary | ICD-10-CM | POA: Diagnosis not present

## 2015-05-31 DIAGNOSIS — H04223 Epiphora due to insufficient drainage, bilateral lacrimal glands: Secondary | ICD-10-CM | POA: Diagnosis not present

## 2015-05-31 DIAGNOSIS — Z9842 Cataract extraction status, left eye: Secondary | ICD-10-CM | POA: Diagnosis not present

## 2015-05-31 DIAGNOSIS — H4311 Vitreous hemorrhage, right eye: Secondary | ICD-10-CM | POA: Diagnosis not present

## 2015-05-31 DIAGNOSIS — H40011 Open angle with borderline findings, low risk, right eye: Secondary | ICD-10-CM | POA: Diagnosis not present

## 2015-05-31 DIAGNOSIS — Z794 Long term (current) use of insulin: Secondary | ICD-10-CM | POA: Diagnosis not present

## 2015-06-14 DIAGNOSIS — H4311 Vitreous hemorrhage, right eye: Secondary | ICD-10-CM | POA: Diagnosis not present

## 2015-06-28 DIAGNOSIS — H4311 Vitreous hemorrhage, right eye: Secondary | ICD-10-CM | POA: Diagnosis not present

## 2015-06-28 DIAGNOSIS — H3341 Traction detachment of retina, right eye: Secondary | ICD-10-CM | POA: Diagnosis not present

## 2015-06-28 DIAGNOSIS — E113591 Type 2 diabetes mellitus with proliferative diabetic retinopathy without macular edema, right eye: Secondary | ICD-10-CM | POA: Diagnosis not present

## 2015-06-30 DIAGNOSIS — F17210 Nicotine dependence, cigarettes, uncomplicated: Secondary | ICD-10-CM | POA: Diagnosis not present

## 2015-06-30 DIAGNOSIS — E785 Hyperlipidemia, unspecified: Secondary | ICD-10-CM | POA: Diagnosis not present

## 2015-06-30 DIAGNOSIS — I10 Essential (primary) hypertension: Secondary | ICD-10-CM | POA: Diagnosis not present

## 2015-06-30 DIAGNOSIS — Z888 Allergy status to other drugs, medicaments and biological substances status: Secondary | ICD-10-CM | POA: Diagnosis not present

## 2015-06-30 DIAGNOSIS — Z79899 Other long term (current) drug therapy: Secondary | ICD-10-CM | POA: Diagnosis not present

## 2015-06-30 DIAGNOSIS — Z7982 Long term (current) use of aspirin: Secondary | ICD-10-CM | POA: Diagnosis not present

## 2015-06-30 DIAGNOSIS — E113533 Type 2 diabetes mellitus with proliferative diabetic retinopathy with traction retinal detachment not involving the macula, bilateral: Secondary | ICD-10-CM | POA: Diagnosis not present

## 2015-06-30 DIAGNOSIS — H409 Unspecified glaucoma: Secondary | ICD-10-CM | POA: Diagnosis not present

## 2015-06-30 DIAGNOSIS — H18232 Secondary corneal edema, left eye: Secondary | ICD-10-CM | POA: Diagnosis not present

## 2015-06-30 DIAGNOSIS — Z8673 Personal history of transient ischemic attack (TIA), and cerebral infarction without residual deficits: Secondary | ICD-10-CM | POA: Diagnosis not present

## 2015-06-30 DIAGNOSIS — E113531 Type 2 diabetes mellitus with proliferative diabetic retinopathy with traction retinal detachment not involving the macula, right eye: Secondary | ICD-10-CM | POA: Diagnosis not present

## 2015-06-30 DIAGNOSIS — M4806 Spinal stenosis, lumbar region: Secondary | ICD-10-CM | POA: Diagnosis not present

## 2015-06-30 DIAGNOSIS — H4311 Vitreous hemorrhage, right eye: Secondary | ICD-10-CM | POA: Diagnosis not present

## 2015-06-30 DIAGNOSIS — K219 Gastro-esophageal reflux disease without esophagitis: Secondary | ICD-10-CM | POA: Diagnosis not present

## 2015-06-30 DIAGNOSIS — Z961 Presence of intraocular lens: Secondary | ICD-10-CM | POA: Diagnosis not present

## 2015-07-19 DIAGNOSIS — I10 Essential (primary) hypertension: Secondary | ICD-10-CM | POA: Diagnosis not present

## 2015-07-19 DIAGNOSIS — E1165 Type 2 diabetes mellitus with hyperglycemia: Secondary | ICD-10-CM | POA: Diagnosis not present

## 2015-07-19 DIAGNOSIS — E119 Type 2 diabetes mellitus without complications: Secondary | ICD-10-CM | POA: Diagnosis not present

## 2015-07-19 DIAGNOSIS — I739 Peripheral vascular disease, unspecified: Secondary | ICD-10-CM | POA: Diagnosis not present

## 2015-07-19 DIAGNOSIS — E039 Hypothyroidism, unspecified: Secondary | ICD-10-CM | POA: Diagnosis not present

## 2015-07-19 DIAGNOSIS — Z0000 Encounter for general adult medical examination without abnormal findings: Secondary | ICD-10-CM | POA: Diagnosis not present

## 2015-07-19 DIAGNOSIS — R5383 Other fatigue: Secondary | ICD-10-CM | POA: Diagnosis not present

## 2015-07-19 DIAGNOSIS — E559 Vitamin D deficiency, unspecified: Secondary | ICD-10-CM | POA: Diagnosis not present

## 2015-08-16 DIAGNOSIS — H3341 Traction detachment of retina, right eye: Secondary | ICD-10-CM | POA: Diagnosis not present

## 2015-08-16 DIAGNOSIS — H4311 Vitreous hemorrhage, right eye: Secondary | ICD-10-CM | POA: Diagnosis not present

## 2015-08-16 DIAGNOSIS — E113591 Type 2 diabetes mellitus with proliferative diabetic retinopathy without macular edema, right eye: Secondary | ICD-10-CM | POA: Diagnosis not present

## 2015-08-25 ENCOUNTER — Other Ambulatory Visit: Payer: Self-pay | Admitting: Cardiovascular Disease

## 2015-08-25 DIAGNOSIS — I739 Peripheral vascular disease, unspecified: Secondary | ICD-10-CM

## 2015-08-29 ENCOUNTER — Ambulatory Visit (HOSPITAL_COMMUNITY)
Admission: RE | Admit: 2015-08-29 | Discharge: 2015-08-29 | Disposition: A | Discharge status: Home / Self Care | Payer: Medicare Other | Source: Ambulatory Visit | Attending: Cardiology | Admitting: Cardiology

## 2015-08-29 DIAGNOSIS — I70203 Unspecified atherosclerosis of native arteries of extremities, bilateral legs: Secondary | ICD-10-CM | POA: Diagnosis not present

## 2015-08-29 DIAGNOSIS — E1122 Type 2 diabetes mellitus with diabetic chronic kidney disease: Secondary | ICD-10-CM | POA: Diagnosis not present

## 2015-08-29 DIAGNOSIS — Z720 Tobacco use: Secondary | ICD-10-CM | POA: Insufficient documentation

## 2015-08-29 DIAGNOSIS — I129 Hypertensive chronic kidney disease with stage 1 through stage 4 chronic kidney disease, or unspecified chronic kidney disease: Secondary | ICD-10-CM | POA: Insufficient documentation

## 2015-08-29 DIAGNOSIS — N183 Chronic kidney disease, stage 3 (moderate): Secondary | ICD-10-CM | POA: Insufficient documentation

## 2015-08-29 DIAGNOSIS — I739 Peripheral vascular disease, unspecified: Secondary | ICD-10-CM | POA: Insufficient documentation

## 2015-08-29 DIAGNOSIS — E785 Hyperlipidemia, unspecified: Secondary | ICD-10-CM | POA: Diagnosis not present

## 2015-08-29 DIAGNOSIS — E1142 Type 2 diabetes mellitus with diabetic polyneuropathy: Secondary | ICD-10-CM | POA: Insufficient documentation

## 2015-09-02 ENCOUNTER — Encounter: Payer: Self-pay | Admitting: *Deleted

## 2015-09-22 ENCOUNTER — Telehealth: Payer: Self-pay | Admitting: Cardiovascular Disease

## 2015-09-22 ENCOUNTER — Other Ambulatory Visit: Payer: Self-pay | Admitting: *Deleted

## 2015-09-22 MED ORDER — CLOPIDOGREL BISULFATE 75 MG PO TABS: 75.0000 mg | ORAL_TABLET | Freq: Every day | ORAL | Status: DC

## 2015-09-22 NOTE — Telephone Encounter (Signed)
NEW MESSAGE   Pt verbalized that he is calling because he missed a call

## 2015-09-22 NOTE — Telephone Encounter (Signed)
Notes Recorded by Lorretta Harp, MD on 08/30/2015 at 2:09 PM Return office visit with me to discuss results at next available  Results called to pt. Pt verbalized understanding. OV scheduled for 10/25/15.

## 2015-09-28 DIAGNOSIS — Z6826 Body mass index (BMI) 26.0-26.9, adult: Secondary | ICD-10-CM | POA: Diagnosis not present

## 2015-09-28 DIAGNOSIS — M47816 Spondylosis without myelopathy or radiculopathy, lumbar region: Secondary | ICD-10-CM | POA: Diagnosis not present

## 2015-09-28 DIAGNOSIS — I10 Essential (primary) hypertension: Secondary | ICD-10-CM | POA: Diagnosis not present

## 2015-10-11 DIAGNOSIS — H40011 Open angle with borderline findings, low risk, right eye: Secondary | ICD-10-CM | POA: Diagnosis not present

## 2015-10-13 DIAGNOSIS — G629 Polyneuropathy, unspecified: Secondary | ICD-10-CM | POA: Diagnosis not present

## 2015-10-25 ENCOUNTER — Encounter: Payer: Self-pay | Admitting: Cardiovascular Disease

## 2015-10-25 ENCOUNTER — Ambulatory Visit (INDEPENDENT_AMBULATORY_CARE_PROVIDER_SITE_OTHER): Payer: Medicare Other | Admitting: Cardiovascular Disease

## 2015-10-25 VITALS — BP 98/60 | HR 94 | Ht 72.0 in | Wt 191.0 lb

## 2015-10-25 DIAGNOSIS — Z79899 Other long term (current) drug therapy: Secondary | ICD-10-CM

## 2015-10-25 DIAGNOSIS — I10 Essential (primary) hypertension: Secondary | ICD-10-CM

## 2015-10-25 DIAGNOSIS — E785 Hyperlipidemia, unspecified: Secondary | ICD-10-CM

## 2015-10-25 DIAGNOSIS — I739 Peripheral vascular disease, unspecified: Secondary | ICD-10-CM | POA: Diagnosis not present

## 2015-10-25 DIAGNOSIS — F17200 Nicotine dependence, unspecified, uncomplicated: Secondary | ICD-10-CM

## 2015-10-25 MED ORDER — LOSARTAN POTASSIUM 25 MG PO TABS: 25.0000 mg | ORAL_TABLET | Freq: Every day | ORAL | 3 refills | Status: DC

## 2015-10-25 MED ORDER — ROSUVASTATIN CALCIUM 5 MG PO TABS: 5.0000 mg | ORAL_TABLET | Freq: Every day | ORAL | 3 refills | Status: DC

## 2015-10-25 NOTE — Assessment & Plan Note (Signed)
History of peripheral arterial disease and critical limb ischemia status post staged right and left SFA atherectomy and drug-eluting balloon antral plasty in June and July of last year. His most recent Dopplers performed 08/29/15 revealed a right ABI of 0.83 with a high-frequency signal in his mid right SFA although this is not significantly changed from his previous Doppler performed 10/04/14. His left ABI was 0.98. His claudication is improved although he does have back and hip pain which I suspect is radicular.

## 2015-10-25 NOTE — Assessment & Plan Note (Signed)
History of hypertension with blood pressure measured 90/60. He is on losartan 50 mg a day which I will decrease to 25 mg a day.

## 2015-10-25 NOTE — Patient Instructions (Signed)
Medication Instructions:  Your physician has recommended you make the following change in your medication:  1- DECREASE Losartan to 25 mg (1 tablet) by mouth daily  2- CHANGE Crestor to 5 mg (1 tablet) by mouth daily.   Labwork: Your physician recommends that you return for lab work in [place] FAST- DO NOT EAT OR DRINK AFTER MIDNIGHT THE DAY OF THE LABWORK. The lab can be found on the FIRST FLOOR of out building in [address]   Testing/Procedures: Your physician has requested that you have a lower extremity arterial doppler- During this test, ultrasound is used to evaluate arterial blood flow in the legs. Allow approximately one hour for this exam. IN December 2017.   Follow-Up: We request that you follow-up in: 6 MONTHS with an extender and in 12 MONTHS with Dr Andria Rhein will receive a reminder letter in the mail two months in advance. If you don't receive a letter, please call our office to schedule the follow-up appointment.  If you need a refill on your cardiac medications before your next appointment, please call your pharmacy.

## 2015-10-25 NOTE — Assessment & Plan Note (Signed)
History of hyperlipidemia on Crestor 5 mg a day for lipid profile performed 02/22/15 revealing total cholesterol 89, LDL 136 and HDL of 30. We will increase his Crestor from 5-10 mg a day and recheck a lipid and liver profile

## 2015-10-25 NOTE — Progress Notes (Signed)
10/25/2015 Harry [name]   03-Mar-1946  [id number]  Primary Physician [name], MD Primary Cardiologist: Lorretta Harp MD Renae Gloss  HPI:  Harry [name] is a 70 year old married African-American male father of 3 children, grandfather of 3 grandchildren who is accompanied by his wife Harry [name], and his daughter today who is  a Marine scientist. I last saw him in the office 09/14/14.He was referred by Dr. Barkley Bruns, his podiatrist, for evaluation of critical limb ischemia. His primary care physician is Dr. Legrand Rams and [facility]. He is a retired Administrator for [employer]. His cardiovascular risk factor profile is notable for 50-pack-years of tobacco abuse currently smoking one pack per day and recalcitrant risk factor modification, treated diabetes, hypertension and hyperlipidemia. 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 He denies chest pain or shortness of breath. He does have hip buttock and thigh heaviness with ambulation which may or may not be claudication as well as a healing right second toe ulcer probably related to diabetic nephropathy and physical trauma. Since I saw him 2 months ago his right second toe ulcer has healed. Dopplers performed in our office 07/09/14 revealed ABIs in the 0.7 range with occluded SFAs bilaterally and tibial disease on the right. I performed peripheral angiography on him 08/29/14 revealing occluded SFAs bilaterally. I ultimately performed [facility] one direction atherectomy, drug eluding balloon angioplasty on the left SFA with excellent angiographic  and clinical result. One month later he underwent staged right SFA intervention. His claudication improved. His most recent Dopplers performed 08/29/15 revealed patent SFAs with a right ABI 0.83 although he did have a high-frequency signal in his mid right SFA unchanged from prior Doppler performed 10/04/14. His left ABI was  0.98. He does continue to smoke however and has admitted to dietary indiscretion. His complaint now is with back and hip pain which I suspect is radicular.  Current Outpatient Prescriptions  Medication Sig Dispense Refill  . aspirin 81 MG EC tablet Take 1 tablet (81 mg total) by mouth daily.    . clopidogrel (PLAVIX) 75 MG tablet Take 1 tablet (75 mg total) by mouth daily with breakfast. 90 tablet 0  . gabapentin (NEURONTIN) 300 MG capsule Take 300 mg by mouth 3 (three) times daily. Reported on 02/25/2015  1  . insulin glargine (LANTUS) 100 UNIT/ML injection Inject 40 Units into the skin daily.     . rosuvastatin (CRESTOR) 5 MG tablet Take ONE tablet by mouth one Monday's and Friday's 30 tablet 0   No current facility-administered medications for this visit.     Allergies  Allergen Reactions  . Lipitor [Atorvastatin] Other (See Comments)    myalgia  . Pravachol [Pravastatin] Rash    Social History   Social History  . Marital status: Married    Spouse name: N/A  . Number of children: N/A  . Years of education: N/A   Occupational History  . Not on file.   Social History Main Topics  . Smoking status: Current Every Day Smoker    Packs/day: 1.00    Years: 45.00    Types: Cigarettes  . Smokeless tobacco: Never Used  . Alcohol use No  . Drug use: No  . Sexual activity: Not Currently   Other Topics Concern  . Not on file   Social History Narrative  . No narrative on file     Review  of Systems: General: negative for chills, fever, night sweats or weight changes.  Cardiovascular: negative for chest pain, dyspnea on exertion, edema, orthopnea, palpitations, paroxysmal nocturnal dyspnea or shortness of breath Dermatological: negative for rash Respiratory: negative for cough or wheezing Urologic: negative for hematuria Abdominal: negative for nausea, vomiting, diarrhea, bright red blood per rectum, melena, or hematemesis Neurologic: negative for visual changes, syncope, or  dizziness All other systems reviewed and are otherwise negative except as noted above.    Blood pressure 98/60, pulse 94, height 6' (1.829 m), weight 191 lb (86.6 kg).  General appearance: alert and no distress Neck: no adenopathy, no carotid bruit, no JVD, supple, symmetrical, trachea midline and thyroid not enlarged, symmetric, no tenderness/mass/nodules Lungs: clear to auscultation bilaterally Heart: regular rate and rhythm, S1, S2 normal, no murmur, click, rub or gallop Extremities: extremities normal, atraumatic, no cyanosis or edema  EKG normal sinus rhythm at 94 with septal Q waves. I personally reviewed this EKG  ASSESSMENT AND PLAN:   Hyperlipidemia LDL goal <70 History of hyperlipidemia on Crestor 5 mg a day for lipid profile performed 02/22/15 revealing total cholesterol 89, LDL 136 and HDL of 30. We will increase his Crestor from 5-10 mg a day and recheck a lipid and liver profile  TOBACCO USE History of ongoing tobacco abuse recalcitrant risk factor modification  Essential hypertension History of hypertension with blood pressure measured 90/60. He is on losartan 50 mg a day which I will decrease to 25 mg a day.  PAD (peripheral artery disease) History of peripheral arterial disease and critical limb ischemia status post staged right and left SFA atherectomy and drug-eluting balloon antral plasty in June and July of last year. His most recent Dopplers performed 08/29/15 revealed a right ABI of 0.83 with a high-frequency signal in his mid right SFA although this is not significantly changed from his previous Doppler performed 10/04/14. His left ABI was 0.98. His claudication is improved although he does have back and hip pain which I suspect is radicular.      Lorretta Harp MD FACP,FACC,FAHA, [facility] 10/25/2015 10:34 AM

## 2015-10-25 NOTE — Assessment & Plan Note (Signed)
History of ongoing tobacco abuse recalcitrant risk factor modification 

## 2015-10-31 ENCOUNTER — Other Ambulatory Visit: Payer: Self-pay

## 2015-11-03 ENCOUNTER — Encounter (HOSPITAL_COMMUNITY): Payer: Medicare Other

## 2015-11-10 ENCOUNTER — Encounter: Payer: Self-pay | Admitting: *Deleted

## 2015-11-11 ENCOUNTER — Ambulatory Visit (INDEPENDENT_AMBULATORY_CARE_PROVIDER_SITE_OTHER): Payer: Medicare Other | Admitting: Diagnostic Neuroimaging

## 2015-11-11 ENCOUNTER — Encounter: Payer: Self-pay | Admitting: Diagnostic Neuroimaging

## 2015-11-11 VITALS — BP 142/87 | HR 81 | Ht 72.0 in | Wt 190.8 lb

## 2015-11-11 DIAGNOSIS — R269 Unspecified abnormalities of gait and mobility: Secondary | ICD-10-CM

## 2015-11-11 DIAGNOSIS — M5441 Lumbago with sciatica, right side: Secondary | ICD-10-CM

## 2015-11-11 DIAGNOSIS — M5442 Lumbago with sciatica, left side: Secondary | ICD-10-CM

## 2015-11-11 DIAGNOSIS — E1142 Type 2 diabetes mellitus with diabetic polyneuropathy: Secondary | ICD-10-CM | POA: Diagnosis not present
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
## 2015-11-11 NOTE — Patient Instructions (Signed)
Thank you for coming to see [place] at [facility]. I hope we have been able to provide you high quality care today.  You may receive a patient satisfaction survey over the next few weeks. We would appreciate your feedback and comments so that we may continue to improve ourselves and the health of our patients.  - I will check additional testing and setup physical therapy   ~~~~~~~~~~~~~~~~~~~~~~~~~~~~~~~~~~~~~~~~~~~~~~~~~~~~~~~~~~~~~~~~~  DR. Konnar Ben'S GUIDE TO HAPPY AND HEALTHY LIVING These are some of my general health and wellness recommendations. Some of them may apply to you better than others. Please use common sense as you try these suggestions and feel free to ask me any questions.   ACTIVITY/FITNESS Mental, social, emotional and physical stimulation are very important for brain and body health. Try learning a new activity (arts, music, language, sports, games).  Keep moving your body to the best of your abilities. You can do this at home, inside or outside, the park, community center, gym or anywhere you like. Consider a physical therapist or personal trainer to get started. Consider the app Sworkit. Fitness trackers such as smart-watches, smart-phones or Fitbits can help as well.   NUTRITION Eat more plants: colorful vegetables, nuts, seeds and berries.  Eat less sugar, salt, preservatives and processed foods.  Avoid toxins such as cigarettes and alcohol.  Drink water when you are thirsty. Warm water with a slice of lemon is an excellent morning drink to start the day.  Consider these websites for more information The Nutrition Source (https://www.henry-hernandez.biz/) Precision Nutrition (WindowBlog.ch)   RELAXATION Consider practicing mindfulness meditation or other relaxation techniques such as deep breathing, prayer, yoga, tai chi, massage. See website mindful.org or the apps Headspace or Calm to help get  started.   SLEEP Try to get at least 7-8+ hours sleep per day. Regular exercise and reduced caffeine will help you sleep better. Practice good sleep hygeine techniques. See website sleep.org for more information.   PLANNING Prepare estate planning, living will, healthcare POA documents. Sometimes this is best planned with the help of an attorney. Theconversationproject.org and agingwithdignity.org are excellent resources.

## 2015-11-13 LAB — CBC WITH DIFFERENTIAL/PLATELET
Basophils Absolute: 0 10*3/uL (ref 0.0–0.2)
Basos: 0 %
EOS (ABSOLUTE): 0.3 10*3/uL (ref 0.0–0.4)
Eos: 5 %
Hematocrit: 36.2 % — ABNORMAL LOW (ref 37.5–51.0)
Hemoglobin: 12 g/dL — ABNORMAL LOW (ref 12.6–17.7)
Immature Grans (Abs): 0 10*3/uL (ref 0.0–0.1)
Immature Granulocytes: 0 %
Lymphocytes Absolute: 1.8 10*3/uL (ref 0.7–3.1)
Lymphs: 35 %
MCH: 30.5 pg (ref 26.6–33.0)
MCHC: 33.1 g/dL (ref 31.5–35.7)
MCV: 92 fL (ref 79–97)
Monocytes Absolute: 0.5 10*3/uL (ref 0.1–0.9)
Monocytes: 10 %
Neutrophils Absolute: 2.6 10*3/uL (ref 1.4–7.0)
Neutrophils: 50 %
Platelets: 269 10*3/uL (ref 150–379)
RBC: 3.94 x10E6/uL — ABNORMAL LOW (ref 4.14–5.80)
RDW: 15 % (ref 12.3–15.4)
WBC: 5.2 10*3/uL (ref 3.4–10.8)

## 2015-11-13 LAB — VITAMIN B12: Vitamin B-12: 421 pg/mL (ref 211–946)

## 2015-11-13 LAB — TSH: TSH: 2.04 u[IU]/mL (ref 0.450–4.500)

## 2015-11-13 LAB — COMPREHENSIVE METABOLIC PANEL
ALT: 9 IU/L (ref 0–44)
AST: 12 IU/L (ref 0–40)
Albumin/Globulin Ratio: 1.3 (ref 1.2–2.2)
Albumin: 4.3 g/dL (ref 3.6–4.8)
Alkaline Phosphatase: 68 IU/L (ref 39–117)
BUN/Creatinine Ratio: 13 (ref 10–24)
BUN: 22 mg/dL (ref 8–27)
Bilirubin Total: 0.8 mg/dL (ref 0.0–1.2)
CO2: 25 mmol/L (ref 18–29)
Calcium: 9.1 mg/dL (ref 8.6–10.2)
Chloride: 101 mmol/L (ref 96–106)
Creatinine, Ser: 1.66 mg/dL — ABNORMAL HIGH (ref 0.76–1.27)
GFR calc Af Amer: 48 mL/min/{1.73_m2} — ABNORMAL LOW (ref >59)
GFR calc non Af Amer: 41 mL/min/{1.73_m2} — ABNORMAL LOW (ref >59)
Globulin, Total: 3.2 g/dL (ref 1.5–4.5)
Glucose: 123 mg/dL — ABNORMAL HIGH (ref 65–99)
Potassium: 4.2 mmol/L (ref 3.5–5.2)
Sodium: 143 mmol/L (ref 134–144)
Total Protein: 7.5 g/dL (ref 6.0–8.5)

## 2015-11-13 LAB — CK: Total CK: 83 U/L (ref 24–204)

## 2015-11-13 LAB — HEMOGLOBIN A1C
Est. average glucose Bld gHb Est-mCnc: 140 mg/dL
Hgb A1c MFr Bld: 6.5 % — ABNORMAL HIGH (ref 4.8–5.6)

## 2015-11-13 LAB — ALDOLASE: Aldolase: 5.7 U/L (ref 3.3–10.3)

## 2015-11-15 ENCOUNTER — Encounter: Payer: Self-pay | Admitting: *Deleted

## 2015-11-15 ENCOUNTER — Telehealth: Payer: Self-pay | Admitting: *Deleted

## 2015-11-15 NOTE — Telephone Encounter (Signed)
Per Dr Leta Baptist, lvm informing patient his lab results are overall unremarkable. Left name, number for any questions.

## 2015-11-29 DIAGNOSIS — H02413 Mechanical ptosis of bilateral eyelids: Secondary | ICD-10-CM | POA: Diagnosis not present

## 2015-11-29 DIAGNOSIS — H02132 Senile ectropion of right lower eyelid: Secondary | ICD-10-CM | POA: Diagnosis not present

## 2015-11-29 DIAGNOSIS — H02834 Dermatochalasis of left upper eyelid: Secondary | ICD-10-CM | POA: Diagnosis not present

## 2015-11-29 DIAGNOSIS — H02103 Unspecified ectropion of right eye, unspecified eyelid: Secondary | ICD-10-CM | POA: Diagnosis not present

## 2015-11-29 DIAGNOSIS — H11821 Conjunctivochalasis, right eye: Secondary | ICD-10-CM | POA: Diagnosis not present

## 2015-11-29 DIAGNOSIS — H5340 Unspecified visual field defects: Secondary | ICD-10-CM | POA: Diagnosis not present

## 2015-11-29 DIAGNOSIS — H02831 Dermatochalasis of right upper eyelid: Secondary | ICD-10-CM | POA: Diagnosis not present

## 2015-11-30 ENCOUNTER — Ambulatory Visit (HOSPITAL_COMMUNITY): Payer: Medicare Other | Attending: Diagnostic Neuroimaging | Admitting: Physical Therapy

## 2015-12-06 ENCOUNTER — Ambulatory Visit (HOSPITAL_COMMUNITY): Payer: Medicare Other | Attending: Diagnostic Neuroimaging | Admitting: Physical Therapy

## 2015-12-06 DIAGNOSIS — R2681 Unsteadiness on feet: Secondary | ICD-10-CM | POA: Diagnosis not present

## 2015-12-06 DIAGNOSIS — M6281 Muscle weakness (generalized): Secondary | ICD-10-CM | POA: Diagnosis not present

## 2015-12-06 DIAGNOSIS — M5416 Radiculopathy, lumbar region: Secondary | ICD-10-CM
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
## 2015-12-08 ENCOUNTER — Ambulatory Visit (HOSPITAL_COMMUNITY): Payer: Medicare Other

## 2015-12-08 DIAGNOSIS — R2681 Unsteadiness on feet: Secondary | ICD-10-CM | POA: Diagnosis not present

## 2015-12-08 DIAGNOSIS — M6281 Muscle weakness (generalized): Secondary | ICD-10-CM

## 2015-12-08 DIAGNOSIS — M5416 Radiculopathy, lumbar region: Secondary | ICD-10-CM | POA: Diagnosis not present
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
## 2015-12-09 ENCOUNTER — Ambulatory Visit (HOSPITAL_COMMUNITY): Payer: Medicare Other

## 2015-12-12 ENCOUNTER — Ambulatory Visit (HOSPITAL_COMMUNITY): Payer: Medicare Other

## 2015-12-12 DIAGNOSIS — R2681 Unsteadiness on feet: Secondary | ICD-10-CM | POA: Diagnosis not present

## 2015-12-12 DIAGNOSIS — M6281 Muscle weakness (generalized): Secondary | ICD-10-CM | POA: Diagnosis not present

## 2015-12-12 DIAGNOSIS — M5416 Radiculopathy, lumbar region: Secondary | ICD-10-CM | POA: Diagnosis not present
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
## 2015-12-14 ENCOUNTER — Ambulatory Visit (HOSPITAL_COMMUNITY): Payer: Medicare Other

## 2015-12-14 DIAGNOSIS — R2681 Unsteadiness on feet: Secondary | ICD-10-CM

## 2015-12-14 DIAGNOSIS — M6281 Muscle weakness (generalized): Secondary | ICD-10-CM

## 2015-12-14 DIAGNOSIS — M5416 Radiculopathy, lumbar region: Secondary | ICD-10-CM
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
## 2015-12-14 NOTE — Patient Instructions (Signed)
Bridging    Slowly raise buttocks from floor, keeping stomach tight. Repeat 15 times per set. Do 1-2 sets per session.   http://orth.exer.us/1096   Copyright  VHI. All rights reserved.   Straight Leg Raise    Tighten stomach and slowly raise locked right leg 10-12 inches from floor. Repeat 15 times per set. Do 1-2 sets per session.  http://orth.exer.us/1102   Copyright  VHI. All rights reserved.

## 2015-12-16 ENCOUNTER — Telehealth (HOSPITAL_COMMUNITY): Payer: Self-pay

## 2015-12-16 ENCOUNTER — Ambulatory Visit (HOSPITAL_COMMUNITY): Payer: Medicare Other

## 2015-12-16 ENCOUNTER — Encounter (HOSPITAL_COMMUNITY): Payer: Self-pay | Admitting: Emergency Medicine

## 2015-12-16 ENCOUNTER — Emergency Department (HOSPITAL_COMMUNITY)
Admission: EM | Admit: 2015-12-16 | Discharge: 2015-12-16 | Disposition: A | Discharge status: Home / Self Care | Payer: Medicare Other | Attending: Emergency Medicine | Admitting: Emergency Medicine

## 2015-12-16 ENCOUNTER — Emergency Department (HOSPITAL_COMMUNITY): Payer: Medicare Other

## 2015-12-16 ENCOUNTER — Other Ambulatory Visit: Payer: Self-pay

## 2015-12-16 DIAGNOSIS — F17210 Nicotine dependence, cigarettes, uncomplicated: Secondary | ICD-10-CM | POA: Diagnosis not present

## 2015-12-16 DIAGNOSIS — R42 Dizziness and giddiness: Secondary | ICD-10-CM | POA: Diagnosis present

## 2015-12-16 DIAGNOSIS — E1122 Type 2 diabetes mellitus with diabetic chronic kidney disease: Secondary | ICD-10-CM | POA: Diagnosis not present

## 2015-12-16 DIAGNOSIS — Z79899 Other long term (current) drug therapy: Secondary | ICD-10-CM | POA: Insufficient documentation

## 2015-12-16 DIAGNOSIS — I129 Hypertensive chronic kidney disease with stage 1 through stage 4 chronic kidney disease, or unspecified chronic kidney disease: Secondary | ICD-10-CM | POA: Diagnosis not present

## 2015-12-16 DIAGNOSIS — Z7982 Long term (current) use of aspirin: Secondary | ICD-10-CM | POA: Diagnosis not present

## 2015-12-16 DIAGNOSIS — Z794 Long term (current) use of insulin: Secondary | ICD-10-CM | POA: Insufficient documentation

## 2015-12-16 DIAGNOSIS — N183 Chronic kidney disease, stage 3 (moderate): Secondary | ICD-10-CM | POA: Insufficient documentation

## 2015-12-16 LAB — CBC WITH DIFFERENTIAL/PLATELET
Basophils Absolute: 0 10*3/uL (ref 0.0–0.1)
Basophils Relative: 1 %
Eosinophils Absolute: 0.2 10*3/uL (ref 0.0–0.7)
Eosinophils Relative: 4 %
HCT: 36.8 % — ABNORMAL LOW (ref 39.0–52.0)
Hemoglobin: 12 g/dL — ABNORMAL LOW (ref 13.0–17.0)
Lymphocytes Relative: 30 %
Lymphs Abs: 1.4 10*3/uL (ref 0.7–4.0)
MCH: 30.5 pg (ref 26.0–34.0)
MCHC: 32.6 g/dL (ref 30.0–36.0)
MCV: 93.4 fL (ref 78.0–100.0)
Monocytes Absolute: 0.6 10*3/uL (ref 0.1–1.0)
Monocytes Relative: 13 %
Neutro Abs: 2.5 10*3/uL (ref 1.7–7.7)
Neutrophils Relative %: 52 %
Platelets: 240 10*3/uL (ref 150–400)
RBC: 3.94 MIL/uL — ABNORMAL LOW (ref 4.22–5.81)
RDW: 13.5 % (ref 11.5–15.5)
WBC: 4.8 10*3/uL (ref 4.0–10.5)

## 2015-12-16 LAB — BASIC METABOLIC PANEL
Anion gap: 5 (ref 5–15)
BUN: 20 mg/dL (ref 6–20)
CO2: 26 mmol/L (ref 22–32)
Calcium: 8.6 mg/dL — ABNORMAL LOW (ref 8.9–10.3)
Chloride: 106 mmol/L (ref 101–111)
Creatinine, Ser: 1.56 mg/dL — ABNORMAL HIGH (ref 0.61–1.24)
GFR calc Af Amer: 51 mL/min — ABNORMAL LOW (ref >60)
GFR calc non Af Amer: 44 mL/min — ABNORMAL LOW (ref >60)
Glucose, Bld: 214 mg/dL — ABNORMAL HIGH (ref 65–99)
Potassium: 3.9 mmol/L (ref 3.5–5.1)
Sodium: 137 mmol/L (ref 135–145)

## 2015-12-16 NOTE — Discharge Instructions (Signed)
Get plenty of rest and drink a lot of fluids.  Follow-up with your eye doctor at [facility] to see if there is any additional treatments that they can give you. This may help your dizziness.  Follow-up with the neurosurgeon, Dr. Joya Salm, about your leg weakness, as suggested by your neurologist.

## 2015-12-16 NOTE — ED Notes (Signed)
Pt states he woke up this morning with dizziness and unable to open his L eye. Recent surgery on L eye, pt denies this happening before. No further deficits noted, no slurred speech, or weakness. Pt denies HA.

## 2015-12-16 NOTE — Telephone Encounter (Signed)
[name] called to cx this apptment l./m at 10:05 am NF
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
## 2015-12-16 NOTE — ED Triage Notes (Signed)
Pt states he woke this am with dizziness, denies confusion, weakness or other symptoms.

## 2015-12-16 NOTE — ED Notes (Signed)
Pt to CT at this time.

## 2015-12-19 ENCOUNTER — Encounter (HOSPITAL_COMMUNITY): Payer: Medicare Other | Admitting: Physical Therapy

## 2015-12-20 ENCOUNTER — Telehealth (HOSPITAL_COMMUNITY): Payer: Self-pay | Admitting: Physical Therapy

## 2015-12-20 ENCOUNTER — Ambulatory Visit (HOSPITAL_COMMUNITY): Payer: Medicare Other | Admitting: Physical Therapy

## 2015-12-20 NOTE — Telephone Encounter (Signed)
He cannot stand up and his eye is closed shut and he will go to the MD on Tuesday 12/27/2015, then he will call us back. NF

## 2015-12-21 ENCOUNTER — Ambulatory Visit (HOSPITAL_COMMUNITY): Payer: Medicare Other

## 2015-12-22 ENCOUNTER — Other Ambulatory Visit: Payer: Self-pay | Admitting: Cardiovascular Disease

## 2015-12-22 NOTE — Telephone Encounter (Signed)
REFILL 

## 2015-12-23 ENCOUNTER — Ambulatory Visit (HOSPITAL_COMMUNITY): Payer: Medicare Other

## 2015-12-26 ENCOUNTER — Ambulatory Visit (HOSPITAL_COMMUNITY): Payer: Medicare Other | Admitting: Physical Therapy

## 2015-12-27 DIAGNOSIS — H3341 Traction detachment of retina, right eye: Secondary | ICD-10-CM | POA: Diagnosis not present

## 2015-12-27 DIAGNOSIS — E113591 Type 2 diabetes mellitus with proliferative diabetic retinopathy without macular edema, right eye: Secondary | ICD-10-CM | POA: Diagnosis not present

## 2015-12-27 DIAGNOSIS — H02413 Mechanical ptosis of bilateral eyelids: Secondary | ICD-10-CM | POA: Diagnosis not present

## 2015-12-27 DIAGNOSIS — H4311 Vitreous hemorrhage, right eye: Secondary | ICD-10-CM | POA: Diagnosis not present

## 2015-12-28 ENCOUNTER — Telehealth (HOSPITAL_COMMUNITY): Payer: Self-pay

## 2015-12-28 ENCOUNTER — Ambulatory Visit (HOSPITAL_COMMUNITY): Payer: Medicare Other

## 2015-12-28 NOTE — Telephone Encounter (Signed)
12/28/15 daughter called to cx all appts.  She said that he said he would call us back when he was ready to resume therapy

## 2015-12-30 ENCOUNTER — Ambulatory Visit (HOSPITAL_COMMUNITY): Payer: Medicare Other | Admitting: Physical Therapy

## 2016-01-02 ENCOUNTER — Encounter (HOSPITAL_COMMUNITY): Payer: Medicare Other | Admitting: Physical Therapy

## 2016-01-03 DIAGNOSIS — I951 Orthostatic hypotension: Secondary | ICD-10-CM | POA: Diagnosis not present

## 2016-01-03 DIAGNOSIS — E1165 Type 2 diabetes mellitus with hyperglycemia: Secondary | ICD-10-CM | POA: Diagnosis not present

## 2016-01-04 ENCOUNTER — Encounter (HOSPITAL_COMMUNITY): Payer: Medicare Other | Admitting: Physical Therapy

## 2016-01-06 ENCOUNTER — Encounter (HOSPITAL_COMMUNITY): Payer: Medicare Other

## 2016-01-09 ENCOUNTER — Encounter (HOSPITAL_COMMUNITY): Payer: Medicare Other | Admitting: Physical Therapy

## 2016-01-11 ENCOUNTER — Encounter (HOSPITAL_COMMUNITY): Payer: Medicare Other | Admitting: Physical Therapy

## 2016-01-13 ENCOUNTER — Encounter: Payer: Self-pay | Admitting: Diagnostic Neuroimaging

## 2016-01-13 ENCOUNTER — Encounter (HOSPITAL_COMMUNITY): Payer: Medicare Other

## 2016-01-31 DIAGNOSIS — H44512 Absolute glaucoma, left eye: Secondary | ICD-10-CM | POA: Diagnosis not present

## 2016-01-31 DIAGNOSIS — H401123 Primary open-angle glaucoma, left eye, severe stage: Secondary | ICD-10-CM | POA: Diagnosis not present

## 2016-01-31 DIAGNOSIS — H40011 Open angle with borderline findings, low risk, right eye: Secondary | ICD-10-CM | POA: Diagnosis not present

## 2016-02-01 ENCOUNTER — Ambulatory Visit: Payer: Medicare Other | Admitting: Nurse Practitioner

## 2016-02-02 ENCOUNTER — Encounter: Payer: Self-pay | Admitting: Nurse Practitioner

## 2016-02-02 DIAGNOSIS — R42 Dizziness and giddiness: Secondary | ICD-10-CM | POA: Diagnosis not present

## 2016-02-02 DIAGNOSIS — E559 Vitamin D deficiency, unspecified: Secondary | ICD-10-CM | POA: Diagnosis not present

## 2016-02-02 DIAGNOSIS — Z0000 Encounter for general adult medical examination without abnormal findings: Secondary | ICD-10-CM | POA: Diagnosis not present

## 2016-02-02 DIAGNOSIS — E1165 Type 2 diabetes mellitus with hyperglycemia: Secondary | ICD-10-CM | POA: Diagnosis not present

## 2016-02-02 DIAGNOSIS — E039 Hypothyroidism, unspecified: Secondary | ICD-10-CM | POA: Diagnosis not present

## 2016-02-02 DIAGNOSIS — F17200 Nicotine dependence, unspecified, uncomplicated: Secondary | ICD-10-CM | POA: Diagnosis not present

## 2016-02-02 DIAGNOSIS — I10 Essential (primary) hypertension: Secondary | ICD-10-CM | POA: Diagnosis not present

## 2016-02-14 ENCOUNTER — Ambulatory Visit: Payer: Medicare Other | Admitting: Diagnostic Neuroimaging

## 2016-02-21 ENCOUNTER — Encounter: Payer: Self-pay | Admitting: Nurse Practitioner

## 2016-02-21 ENCOUNTER — Inpatient Hospital Stay (HOSPITAL_COMMUNITY): Admission: RE | Admit: 2016-02-21 | Payer: Medicare Other | Source: Ambulatory Visit

## 2016-02-21 ENCOUNTER — Ambulatory Visit (INDEPENDENT_AMBULATORY_CARE_PROVIDER_SITE_OTHER): Payer: Medicare Other | Admitting: Nurse Practitioner

## 2016-02-21 VITALS — BP 132/72 | HR 85 | Ht 72.0 in | Wt 192.2 lb

## 2016-02-21 DIAGNOSIS — I10 Essential (primary) hypertension: Secondary | ICD-10-CM

## 2016-02-21 DIAGNOSIS — R269 Unspecified abnormalities of gait and mobility: Secondary | ICD-10-CM | POA: Diagnosis not present

## 2016-02-21 DIAGNOSIS — I739 Peripheral vascular disease, unspecified: Secondary | ICD-10-CM

## 2016-02-21 DIAGNOSIS — M48061 Spinal stenosis, lumbar region without neurogenic claudication: Secondary | ICD-10-CM | POA: Diagnosis not present
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
## 2016-02-21 NOTE — Patient Instructions (Signed)
Given information on Metanx Continue Neurontin 300 mg 3 times daily Exercises by walking Follow-up in 6 months Continue follow-up with Dr. Joya Salm for your back issues

## 2016-03-07 NOTE — Progress Notes (Signed)
I reviewed note and agree with plan.   Seretha Estabrooks R. Blakelynn Scheeler, MD  Certified in Neurology, Neurophysiology and Neuroimaging  [facility] [address], [address] [place], [place] [address] [phone]   

## 2016-03-13 DIAGNOSIS — H40011 Open angle with borderline findings, low risk, right eye: Secondary | ICD-10-CM | POA: Diagnosis not present

## 2016-05-11 ENCOUNTER — Emergency Department (HOSPITAL_COMMUNITY): Payer: Medicare Other

## 2016-05-11 ENCOUNTER — Emergency Department (HOSPITAL_COMMUNITY)
Admission: EM | Admit: 2016-05-11 | Discharge: 2016-05-11 | Disposition: A | Discharge status: Home / Self Care | Payer: Medicare Other | Attending: Emergency Medicine | Admitting: Emergency Medicine

## 2016-05-11 ENCOUNTER — Encounter (HOSPITAL_COMMUNITY): Payer: Self-pay | Admitting: Emergency Medicine

## 2016-05-11 DIAGNOSIS — Z79899 Other long term (current) drug therapy: Secondary | ICD-10-CM | POA: Insufficient documentation

## 2016-05-11 DIAGNOSIS — N183 Chronic kidney disease, stage 3 (moderate): Secondary | ICD-10-CM | POA: Insufficient documentation

## 2016-05-11 DIAGNOSIS — Z7982 Long term (current) use of aspirin: Secondary | ICD-10-CM | POA: Insufficient documentation

## 2016-05-11 DIAGNOSIS — E1122 Type 2 diabetes mellitus with diabetic chronic kidney disease: Secondary | ICD-10-CM | POA: Diagnosis not present

## 2016-05-11 DIAGNOSIS — F17210 Nicotine dependence, cigarettes, uncomplicated: Secondary | ICD-10-CM | POA: Insufficient documentation

## 2016-05-11 DIAGNOSIS — I129 Hypertensive chronic kidney disease with stage 1 through stage 4 chronic kidney disease, or unspecified chronic kidney disease: Secondary | ICD-10-CM | POA: Diagnosis not present

## 2016-05-11 DIAGNOSIS — G9389 Other specified disorders of brain: Secondary | ICD-10-CM | POA: Insufficient documentation

## 2016-05-11 DIAGNOSIS — H547 Unspecified visual loss: Secondary | ICD-10-CM | POA: Diagnosis not present

## 2016-05-11 DIAGNOSIS — R030 Elevated blood-pressure reading, without diagnosis of hypertension: Secondary | ICD-10-CM | POA: Diagnosis not present

## 2016-05-11 DIAGNOSIS — I10 Essential (primary) hypertension: Secondary | ICD-10-CM | POA: Diagnosis not present

## 2016-05-11 DIAGNOSIS — H53131 Sudden visual loss, right eye: Secondary | ICD-10-CM | POA: Diagnosis not present

## 2016-05-11 DIAGNOSIS — H5461 Unqualified visual loss, right eye, normal vision left eye: Secondary | ICD-10-CM

## 2016-05-11 DIAGNOSIS — H538 Other visual disturbances: Secondary | ICD-10-CM | POA: Diagnosis not present

## 2016-05-11 LAB — CBG MONITORING, ED: Glucose-Capillary: 129 mg/dL — ABNORMAL HIGH (ref 65–99)

## 2016-05-11 MED ORDER — METOPROLOL TARTRATE 50 MG PO TABS: 25.0000 mg | ORAL_TABLET | Freq: Two times a day (BID) | ORAL | 1 refills | Status: DC

## 2016-05-11 MED ORDER — LABETALOL HCL 5 MG/ML IV SOLN
20.0000 mg | Freq: Once | INTRAVENOUS | Status: AC
  Administered 2016-05-11: 20 mg via INTRAVENOUS
  Filled 2016-05-11: qty 4

## 2016-05-11 NOTE — ED Notes (Signed)
Spoke to Daughter, [name] on phone to update family.

## 2016-05-11 NOTE — ED Notes (Signed)
Pt assisted by two nurses to a standing position to urinate, but patient ended up not having any output. Pt placed back in bed, repositioned, side rails up. Family at bedside.

## 2016-05-11 NOTE — ED Triage Notes (Signed)
Per EMS: Pt had loss of vision in right eye starting yesterday morning, started as "fuzziness" and now can barely see at all.  Pt is already blind in left eye.  No pain in right eye, no other deficits.  Pt alert and oriented.  215/123, 12 lead unremarkable, no pain.

## 2016-05-11 NOTE — Discharge Instructions (Signed)
You are to see your doctor at [place] at the eye clinic on Monday between 9 and 10 AM -  Keep your head elevated at all times Return to the ER immediately for severe or worsening pain / headache / vomiting / numbness or weakness or arms or legs.

## 2016-05-11 NOTE — ED Notes (Signed)
EKG given to Dr. Zammit 

## 2016-05-11 NOTE — ED Notes (Signed)
Pt assisted with urinal and placed back into bed. Family at bedside.
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
## 2016-05-11 NOTE — ED Notes (Signed)
Called [facility] for [facility] and asked for a consult with Ophthalmologist Dr. Doylene Canning; Dr. Michaelene Song is on call an will be calling back to ext. [phone]

## 2016-05-14 DIAGNOSIS — H4311 Vitreous hemorrhage, right eye: Secondary | ICD-10-CM | POA: Diagnosis not present

## 2016-05-14 DIAGNOSIS — H547 Unspecified visual loss: Secondary | ICD-10-CM | POA: Diagnosis not present

## 2016-05-14 DIAGNOSIS — H4089 Other specified glaucoma: Secondary | ICD-10-CM | POA: Diagnosis not present

## 2016-05-14 DIAGNOSIS — Z9842 Cataract extraction status, left eye: Secondary | ICD-10-CM | POA: Diagnosis not present

## 2016-05-14 DIAGNOSIS — E113551 Type 2 diabetes mellitus with stable proliferative diabetic retinopathy, right eye: Secondary | ICD-10-CM | POA: Diagnosis not present

## 2016-05-14 DIAGNOSIS — H43811 Vitreous degeneration, right eye: Secondary | ICD-10-CM | POA: Diagnosis not present

## 2016-05-14 DIAGNOSIS — H0231 Blepharochalasis right upper eyelid: Secondary | ICD-10-CM | POA: Diagnosis not present

## 2016-05-14 DIAGNOSIS — Z794 Long term (current) use of insulin: Secondary | ICD-10-CM | POA: Diagnosis not present

## 2016-05-14 DIAGNOSIS — H40001 Preglaucoma, unspecified, right eye: Secondary | ICD-10-CM | POA: Diagnosis not present

## 2016-05-14 DIAGNOSIS — H02402 Unspecified ptosis of left eyelid: Secondary | ICD-10-CM | POA: Diagnosis not present

## 2016-05-14 DIAGNOSIS — Z961 Presence of intraocular lens: Secondary | ICD-10-CM | POA: Diagnosis not present

## 2016-05-14 DIAGNOSIS — Z7982 Long term (current) use of aspirin: Secondary | ICD-10-CM | POA: Diagnosis not present

## 2016-05-25 ENCOUNTER — Inpatient Hospital Stay (HOSPITAL_COMMUNITY)
Admission: EM | Admit: 2016-05-25 | Discharge: 2016-05-27 | DRG: 078 | Disposition: A | Discharge status: Home / Self Care | Payer: Medicare Other | Attending: Family Medicine | Admitting: Family Medicine

## 2016-05-25 ENCOUNTER — Emergency Department (HOSPITAL_COMMUNITY): Payer: Medicare Other

## 2016-05-25 ENCOUNTER — Encounter (HOSPITAL_COMMUNITY): Payer: Self-pay | Admitting: Emergency Medicine

## 2016-05-25 DIAGNOSIS — Z794 Long term (current) use of insulin: Secondary | ICD-10-CM

## 2016-05-25 DIAGNOSIS — N179 Acute kidney failure, unspecified: Secondary | ICD-10-CM

## 2016-05-25 DIAGNOSIS — F17210 Nicotine dependence, cigarettes, uncomplicated: Secondary | ICD-10-CM | POA: Diagnosis not present

## 2016-05-25 DIAGNOSIS — E1122 Type 2 diabetes mellitus with diabetic chronic kidney disease: Secondary | ICD-10-CM | POA: Diagnosis not present

## 2016-05-25 DIAGNOSIS — Z8249 Family history of ischemic heart disease and other diseases of the circulatory system: Secondary | ICD-10-CM

## 2016-05-25 DIAGNOSIS — K7682 Hepatic encephalopathy: Secondary | ICD-10-CM

## 2016-05-25 DIAGNOSIS — I674 Hypertensive encephalopathy: Principal | ICD-10-CM | POA: Diagnosis present

## 2016-05-25 DIAGNOSIS — N183 Chronic kidney disease, stage 3 (moderate): Secondary | ICD-10-CM

## 2016-05-25 DIAGNOSIS — E1136 Type 2 diabetes mellitus with diabetic cataract: Secondary | ICD-10-CM | POA: Diagnosis not present

## 2016-05-25 DIAGNOSIS — R4182 Altered mental status, unspecified: Secondary | ICD-10-CM | POA: Diagnosis not present

## 2016-05-25 DIAGNOSIS — G9340 Encephalopathy, unspecified: Secondary | ICD-10-CM

## 2016-05-25 DIAGNOSIS — H5462 Unqualified visual loss, left eye, normal vision right eye: Secondary | ICD-10-CM | POA: Diagnosis present

## 2016-05-25 DIAGNOSIS — I161 Hypertensive emergency: Secondary | ICD-10-CM | POA: Diagnosis not present

## 2016-05-25 DIAGNOSIS — E785 Hyperlipidemia, unspecified: Secondary | ICD-10-CM | POA: Diagnosis present

## 2016-05-25 DIAGNOSIS — K7290 Hepatic failure, unspecified without coma: Secondary | ICD-10-CM

## 2016-05-25 DIAGNOSIS — I639 Cerebral infarction, unspecified: Secondary | ICD-10-CM | POA: Diagnosis not present

## 2016-05-25 DIAGNOSIS — N189 Chronic kidney disease, unspecified: Secondary | ICD-10-CM

## 2016-05-25 DIAGNOSIS — R410 Disorientation, unspecified: Secondary | ICD-10-CM | POA: Diagnosis not present

## 2016-05-25 DIAGNOSIS — I129 Hypertensive chronic kidney disease with stage 1 through stage 4 chronic kidney disease, or unspecified chronic kidney disease: Secondary | ICD-10-CM | POA: Diagnosis present

## 2016-05-25 DIAGNOSIS — I10 Essential (primary) hypertension: Secondary | ICD-10-CM | POA: Diagnosis not present

## 2016-05-25 DIAGNOSIS — E7800 Pure hypercholesterolemia, unspecified: Secondary | ICD-10-CM | POA: Diagnosis present

## 2016-05-25 DIAGNOSIS — F919 Conduct disorder, unspecified: Secondary | ICD-10-CM | POA: Diagnosis present

## 2016-05-25 DIAGNOSIS — Z9841 Cataract extraction status, right eye: Secondary | ICD-10-CM

## 2016-05-25 DIAGNOSIS — Z888 Allergy status to other drugs, medicaments and biological substances status: Secondary | ICD-10-CM

## 2016-05-25 DIAGNOSIS — Z961 Presence of intraocular lens: Secondary | ICD-10-CM | POA: Diagnosis present

## 2016-05-25 DIAGNOSIS — I679 Cerebrovascular disease, unspecified: Secondary | ICD-10-CM | POA: Diagnosis present

## 2016-05-25 DIAGNOSIS — I160 Hypertensive urgency: Secondary | ICD-10-CM

## 2016-05-25 DIAGNOSIS — Z7982 Long term (current) use of aspirin: Secondary | ICD-10-CM

## 2016-05-25 DIAGNOSIS — G629 Polyneuropathy, unspecified: Secondary | ICD-10-CM | POA: Diagnosis present

## 2016-05-25 DIAGNOSIS — Z8673 Personal history of transient ischemic attack (TIA), and cerebral infarction without residual deficits: Secondary | ICD-10-CM

## 2016-05-25 DIAGNOSIS — E1169 Type 2 diabetes mellitus with other specified complication: Secondary | ICD-10-CM

## 2016-05-25 DIAGNOSIS — Z7902 Long term (current) use of antithrombotics/antiplatelets: Secondary | ICD-10-CM

## 2016-05-25 DIAGNOSIS — Z9842 Cataract extraction status, left eye: Secondary | ICD-10-CM

## 2016-05-25 DIAGNOSIS — N1831 Chronic kidney disease, stage 3a: Secondary | ICD-10-CM

## 2016-05-25 LAB — COMPREHENSIVE METABOLIC PANEL
ALT: 18 U/L (ref 17–63)
AST: 17 U/L (ref 15–41)
Albumin: 3.4 g/dL — ABNORMAL LOW (ref 3.5–5.0)
Alkaline Phosphatase: 66 U/L (ref 38–126)
Anion gap: 7 (ref 5–15)
BUN: 21 mg/dL — ABNORMAL HIGH (ref 6–20)
CO2: 26 mmol/L (ref 22–32)
Calcium: 8.7 mg/dL — ABNORMAL LOW (ref 8.9–10.3)
Chloride: 105 mmol/L (ref 101–111)
Creatinine, Ser: 1.6 mg/dL — ABNORMAL HIGH (ref 0.61–1.24)
GFR calc Af Amer: 49 mL/min — ABNORMAL LOW (ref >60)
GFR calc non Af Amer: 42 mL/min — ABNORMAL LOW (ref >60)
Glucose, Bld: 199 mg/dL — ABNORMAL HIGH (ref 65–99)
Potassium: 4 mmol/L (ref 3.5–5.1)
Sodium: 138 mmol/L (ref 135–145)
Total Bilirubin: 1.1 mg/dL (ref 0.3–1.2)
Total Protein: 6.9 g/dL (ref 6.5–8.1)

## 2016-05-25 LAB — CBC
HCT: 34.2 % — ABNORMAL LOW (ref 39.0–52.0)
Hemoglobin: 11.3 g/dL — ABNORMAL LOW (ref 13.0–17.0)
MCH: 30.8 pg (ref 26.0–34.0)
MCHC: 33 g/dL (ref 30.0–36.0)
MCV: 93.2 fL (ref 78.0–100.0)
Platelets: 235 10*3/uL (ref 150–400)
RBC: 3.67 MIL/uL — ABNORMAL LOW (ref 4.22–5.81)
RDW: 13.8 % (ref 11.5–15.5)
WBC: 5.4 10*3/uL (ref 4.0–10.5)

## 2016-05-25 LAB — URINALYSIS, ROUTINE W REFLEX MICROSCOPIC
Bacteria, UA: NONE SEEN
Bilirubin Urine: NEGATIVE
Glucose, UA: >=500 mg/dL — AB
Hgb urine dipstick: NEGATIVE
Ketones, ur: NEGATIVE mg/dL
Leukocytes, UA: NEGATIVE
Nitrite: NEGATIVE
Protein, ur: 30 mg/dL — AB
Specific Gravity, Urine: 1.023 (ref 1.005–1.030)
Squamous Epithelial / LPF: NONE SEEN
pH: 5 (ref 5.0–8.0)

## 2016-05-25 LAB — CBG MONITORING, ED: Glucose-Capillary: 145 mg/dL — ABNORMAL HIGH (ref 65–99)

## 2016-05-25 MED ORDER — NICARDIPINE HCL IN NACL 20-0.86 MG/200ML-% IV SOLN
3.0000 mg/h | INTRAVENOUS | Status: DC
  Filled 2016-05-25: qty 200

## 2016-05-25 MED ORDER — LABETALOL HCL 5 MG/ML IV SOLN
20.0000 mg | Freq: Once | INTRAVENOUS | Status: AC
  Administered 2016-05-25: 20 mg via INTRAVENOUS
  Filled 2016-05-25: qty 4

## 2016-05-25 NOTE — ED Triage Notes (Signed)
Pt's dtr reports that he has become more confused/hallucinating/ grabbing things in the air over the past 3 days. Pt gait worsening. Pt has had shuffling gait over the past few weeks, but pt not able to walk well at all now. Pt lost vision from hypertensive crisis March 9. Per dtr, pt has not been "right" since then.
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
## 2016-05-25 NOTE — ED Notes (Signed)
Patient transported to MRI 
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
## 2016-05-25 NOTE — ED Notes (Signed)
House Coverage called and stated pt needed to go to ICU bed because of Cardene gtt.  Admitting MD paged and notified.  She requested # to speak with [facility].

## 2016-05-25 NOTE — ED Notes (Signed)
cbg 145

## 2016-05-25 NOTE — ED Notes (Signed)
Nurse submitted add on order for TSH

## 2016-05-26 DIAGNOSIS — Z888 Allergy status to other drugs, medicaments and biological substances status: Secondary | ICD-10-CM | POA: Diagnosis not present

## 2016-05-26 DIAGNOSIS — Z7902 Long term (current) use of antithrombotics/antiplatelets: Secondary | ICD-10-CM | POA: Diagnosis not present

## 2016-05-26 DIAGNOSIS — I129 Hypertensive chronic kidney disease with stage 1 through stage 4 chronic kidney disease, or unspecified chronic kidney disease: Secondary | ICD-10-CM | POA: Diagnosis present

## 2016-05-26 DIAGNOSIS — R4182 Altered mental status, unspecified: Secondary | ICD-10-CM | POA: Diagnosis not present

## 2016-05-26 DIAGNOSIS — Z794 Long term (current) use of insulin: Secondary | ICD-10-CM | POA: Diagnosis not present

## 2016-05-26 DIAGNOSIS — I161 Hypertensive emergency: Secondary | ICD-10-CM | POA: Diagnosis not present

## 2016-05-26 DIAGNOSIS — Z961 Presence of intraocular lens: Secondary | ICD-10-CM | POA: Diagnosis present

## 2016-05-26 DIAGNOSIS — F919 Conduct disorder, unspecified: Secondary | ICD-10-CM | POA: Diagnosis present

## 2016-05-26 DIAGNOSIS — E1136 Type 2 diabetes mellitus with diabetic cataract: Secondary | ICD-10-CM | POA: Diagnosis present

## 2016-05-26 DIAGNOSIS — R410 Disorientation, unspecified: Secondary | ICD-10-CM

## 2016-05-26 DIAGNOSIS — Z8673 Personal history of transient ischemic attack (TIA), and cerebral infarction without residual deficits: Secondary | ICD-10-CM | POA: Diagnosis not present

## 2016-05-26 DIAGNOSIS — Z9842 Cataract extraction status, left eye: Secondary | ICD-10-CM | POA: Diagnosis not present

## 2016-05-26 DIAGNOSIS — E785 Hyperlipidemia, unspecified: Secondary | ICD-10-CM | POA: Diagnosis present

## 2016-05-26 DIAGNOSIS — K7290 Hepatic failure, unspecified without coma: Secondary | ICD-10-CM | POA: Diagnosis present

## 2016-05-26 DIAGNOSIS — N183 Chronic kidney disease, stage 3 (moderate): Secondary | ICD-10-CM | POA: Diagnosis not present

## 2016-05-26 DIAGNOSIS — Z8249 Family history of ischemic heart disease and other diseases of the circulatory system: Secondary | ICD-10-CM | POA: Diagnosis not present

## 2016-05-26 DIAGNOSIS — E7800 Pure hypercholesterolemia, unspecified: Secondary | ICD-10-CM | POA: Diagnosis present

## 2016-05-26 DIAGNOSIS — G629 Polyneuropathy, unspecified: Secondary | ICD-10-CM | POA: Diagnosis present

## 2016-05-26 DIAGNOSIS — G9340 Encephalopathy, unspecified: Secondary | ICD-10-CM | POA: Diagnosis not present

## 2016-05-26 DIAGNOSIS — F17210 Nicotine dependence, cigarettes, uncomplicated: Secondary | ICD-10-CM | POA: Diagnosis present

## 2016-05-26 DIAGNOSIS — I679 Cerebrovascular disease, unspecified: Secondary | ICD-10-CM | POA: Diagnosis not present

## 2016-05-26 DIAGNOSIS — I674 Hypertensive encephalopathy: Secondary | ICD-10-CM | POA: Diagnosis present

## 2016-05-26 DIAGNOSIS — H5462 Unqualified visual loss, left eye, normal vision right eye: Secondary | ICD-10-CM | POA: Diagnosis present

## 2016-05-26 DIAGNOSIS — Z7982 Long term (current) use of aspirin: Secondary | ICD-10-CM | POA: Diagnosis not present

## 2016-05-26 DIAGNOSIS — E1122 Type 2 diabetes mellitus with diabetic chronic kidney disease: Secondary | ICD-10-CM | POA: Diagnosis not present

## 2016-05-26 DIAGNOSIS — Z9841 Cataract extraction status, right eye: Secondary | ICD-10-CM | POA: Diagnosis not present

## 2016-05-26 LAB — GLUCOSE, CAPILLARY
Glucose-Capillary: 159 mg/dL — ABNORMAL HIGH (ref 65–99)
Glucose-Capillary: 65 mg/dL (ref 65–99)
Glucose-Capillary: 108 mg/dL — ABNORMAL HIGH (ref 65–99)
Glucose-Capillary: 147 mg/dL — ABNORMAL HIGH (ref 65–99)
Glucose-Capillary: 194 mg/dL — ABNORMAL HIGH (ref 65–99)

## 2016-05-26 LAB — RAPID URINE DRUG SCREEN, HOSP PERFORMED
Amphetamines: NOT DETECTED
Barbiturates: NOT DETECTED
Benzodiazepines: NOT DETECTED
Cocaine: NOT DETECTED
Opiates: NOT DETECTED
Tetrahydrocannabinol: NOT DETECTED

## 2016-05-26 LAB — LIPID PANEL
Cholesterol: 122 mg/dL (ref 0–200)
HDL: 31 mg/dL — ABNORMAL LOW (ref >40)
LDL Cholesterol: 76 mg/dL (ref 0–99)
Total CHOL/HDL Ratio: 3.9 RATIO
Triglycerides: 77 mg/dL (ref <150)
VLDL: 15 mg/dL (ref 0–40)

## 2016-05-26 LAB — TSH: TSH: 1.309 u[IU]/mL (ref 0.350–4.500)

## 2016-05-26 LAB — MRSA PCR SCREENING: MRSA by PCR: NEGATIVE

## 2016-05-26 MED ORDER — METOPROLOL TARTRATE 25 MG PO TABS: 25.0000 mg | ORAL_TABLET | Freq: Two times a day (BID) | ORAL | Status: DC

## 2016-05-26 MED ORDER — INSULIN ASPART 100 UNIT/ML ~~LOC~~ SOLN: 0.0000 [IU] | Freq: Every day | SUBCUTANEOUS | Status: DC

## 2016-05-26 MED ORDER — LABETALOL HCL 5 MG/ML IV SOLN
10.0000 mg | INTRAVENOUS | Status: DC | PRN
  Administered 2016-05-26 (×3): 10 mg via INTRAVENOUS
  Filled 2016-05-26 (×3): qty 4

## 2016-05-26 MED ORDER — ASPIRIN EC 81 MG PO TBEC
81.0000 mg | DELAYED_RELEASE_TABLET | Freq: Every day | ORAL | Status: DC
Start: 2016-05-26 — End: 2016-05-27
  Administered 2016-05-26 – 2016-05-27 (×2): 81 mg via ORAL
  Filled 2016-05-26 (×2): qty 1

## 2016-05-26 MED ORDER — NICOTINE 7 MG/24HR TD PT24
7.0000 mg | MEDICATED_PATCH | Freq: Every day | TRANSDERMAL | Status: DC
  Administered 2016-05-26 – 2016-05-27 (×2): 7 mg via TRANSDERMAL
  Filled 2016-05-26 (×2): qty 1

## 2016-05-26 MED ORDER — GABAPENTIN 300 MG PO CAPS
300.0000 mg | ORAL_CAPSULE | Freq: Every day | ORAL | Status: DC
  Administered 2016-05-26: 300 mg via ORAL
  Filled 2016-05-26: qty 1

## 2016-05-26 MED ORDER — METOPROLOL TARTRATE 50 MG PO TABS
50.0000 mg | ORAL_TABLET | Freq: Two times a day (BID) | ORAL | Status: DC
  Administered 2016-05-26 – 2016-05-27 (×3): 50 mg via ORAL
  Filled 2016-05-26 (×3): qty 1

## 2016-05-26 MED ORDER — CLOPIDOGREL BISULFATE 75 MG PO TABS
75.0000 mg | ORAL_TABLET | Freq: Every day | ORAL | Status: DC
  Administered 2016-05-26 – 2016-05-27 (×2): 75 mg via ORAL
  Filled 2016-05-26 (×2): qty 1

## 2016-05-26 MED ORDER — INSULIN GLARGINE 100 UNIT/ML ~~LOC~~ SOLN
20.0000 [IU] | Freq: Every day | SUBCUTANEOUS | Status: DC
  Administered 2016-05-26 (×2): 20 [IU] via SUBCUTANEOUS
  Filled 2016-05-26 (×3): qty 0.2

## 2016-05-26 MED ORDER — ACETAMINOPHEN 325 MG PO TABS: 650.0000 mg | ORAL_TABLET | ORAL | Status: DC | PRN

## 2016-05-26 MED ORDER — POLYVINYL ALCOHOL 1.4 % OP SOLN
1.0000 [drp] | OPHTHALMIC | Status: DC | PRN
  Administered 2016-05-26: 1 [drp] via OPHTHALMIC
  Filled 2016-05-26: qty 15

## 2016-05-26 MED ORDER — DORZOLAMIDE HCL-TIMOLOL MAL 2-0.5 % OP SOLN: 1.0000 [drp] | Freq: Two times a day (BID) | OPHTHALMIC | Status: DC

## 2016-05-26 MED ORDER — ACETAMINOPHEN 160 MG/5ML PO SOLN: 650.0000 mg | ORAL | Status: DC | PRN

## 2016-05-26 MED ORDER — ACETAMINOPHEN 650 MG RE SUPP: 650.0000 mg | RECTAL | Status: DC | PRN

## 2016-05-26 MED ORDER — AMLODIPINE BESYLATE 5 MG PO TABS
5.0000 mg | ORAL_TABLET | Freq: Every day | ORAL | Status: DC
  Administered 2016-05-26 – 2016-05-27 (×2): 5 mg via ORAL
  Filled 2016-05-26 (×2): qty 1

## 2016-05-26 MED ORDER — ROSUVASTATIN CALCIUM 5 MG PO TABS
5.0000 mg | ORAL_TABLET | Freq: Every day | ORAL | Status: DC
  Administered 2016-05-26: 5 mg via ORAL
  Filled 2016-05-26: qty 1

## 2016-05-26 MED ORDER — BASAGLAR KWIKPEN 100 UNIT/ML ~~LOC~~ SOPN: 20.0000 [IU] | PEN_INJECTOR | Freq: Every day | SUBCUTANEOUS | Status: DC

## 2016-05-26 MED ORDER — INSULIN ASPART 100 UNIT/ML ~~LOC~~ SOLN
0.0000 [IU] | Freq: Three times a day (TID) | SUBCUTANEOUS | Status: DC
  Administered 2016-05-26 – 2016-05-27 (×2): 2 [IU] via SUBCUTANEOUS
  Administered 2016-05-27: 3 [IU] via SUBCUTANEOUS

## 2016-05-26 MED ORDER — GABAPENTIN 300 MG PO CAPS
300.0000 mg | ORAL_CAPSULE | Freq: Two times a day (BID) | ORAL | Status: DC
  Administered 2016-05-26 – 2016-05-27 (×2): 300 mg via ORAL
  Filled 2016-05-26 (×2): qty 1

## 2016-05-26 NOTE — Evaluation (Signed)
Physical Therapy Evaluation Patient Details Name: Harry [name] MRN: [id number] DOB: 1945-08-12 Today's Date: 05/26/2016   History of Present Illness  Pt is a 71 y/o male admitted secondary to altered mental status, behavioral changes and found to be in a hypertensive emergency in ED. Head CT negative for any acute findings. PMH including but not limited to DM, CKD, HTN, PVD, hx of CVA in 1999 and visual impairments.  Clinical Impression  Pt presented supine in bed with HOB elevated, awake and willing to participate in therapy session. Prior to admission, pt reported that he was mod I with functional mobility with use of SPC. However, pt not a great historian and no family/caregivers present to confirm PLOF information. Pt currently requires mod A for transfers and mod A to maintain upright standing balance. Pt would continue to benefit from skilled physical therapy services at this time while admitted and after d/c to address the below listed limitations in order to improve overall safety and independence with functional mobility.      Follow Up Recommendations SNF;Supervision/Assistance - 24 hour    Equipment Recommendations  Rolling walker with 5" wheels    Recommendations for Other Services       Precautions / Restrictions Precautions Precautions: Fall Restrictions Weight Bearing Restrictions: No      Mobility  Bed Mobility Overal bed mobility: Needs Assistance Bed Mobility: Supine to Sit     Supine to sit: Min guard;HOB elevated     General bed mobility comments: increased time, HOB elevated, min guard for safety  Transfers Overall transfer level: Needs assistance Equipment used: None Transfers: Sit to/from Stand Sit to Stand: Mod assist         General transfer comment: increased time, mod A to rise from bed and for stability with transition as pt with posterior lean and unable to correct with verbal/tactile cues  Ambulation/Gait             General  Gait Details: did not attempt at this time as pt was becoming frustrated about eating and his food tray arrived at that time  Stairs            Wheelchair Mobility    Modified Rankin (Stroke Patients Only)       Balance Overall balance assessment: Needs assistance Sitting-balance support: Feet supported Sitting balance-Leahy Scale: Fair     Standing balance support: No upper extremity supported Standing balance-Leahy Scale: Poor Standing balance comment: pt required mod A to maintain upright standing balance as pt maintained posterior lean                             Pertinent Vitals/Pain Pain Assessment: No/denies pain    Home Living Family/patient expects to be discharged to:: Private residence Living Arrangements: Spouse/significant other;Children Available Help at Discharge: Family;Available PRN/intermittently           Home Equipment: Cane - single point Additional Comments: pt is a poor historian and became agitated intermittently throughout evaluation    Prior Function Level of Independence: Independent with assistive device(s)         Comments: pt reported that he ambulated with [facility] and was independent with ADLs. No family/caregiver present to confirm provided information.     Hand Dominance   Dominant Hand: Right    Extremity/Trunk Assessment   Upper Extremity Assessment Upper Extremity Assessment: Overall WFL for tasks assessed    Lower Extremity Assessment Lower Extremity Assessment: Generalized weakness  Cervical / Trunk Assessment Cervical / Trunk Assessment: Normal  Communication   Communication: No difficulties  Cognition Arousal/Alertness: Awake/alert Behavior During Therapy: WFL for tasks assessed/performed;Agitated Overall Cognitive Status: Impaired/Different from baseline Area of Impairment: Safety/judgement;Problem solving                         Safety/Judgement: Decreased awareness of  safety;Decreased awareness of deficits   Problem Solving: Difficulty sequencing;Requires verbal cues;Requires tactile cues        General Comments      Exercises     Assessment/Plan    PT Assessment Patient needs continued PT services  PT Problem List Decreased strength;Decreased activity tolerance;Decreased balance;Decreased mobility;Decreased coordination;Decreased cognition;Decreased knowledge of use of DME;Decreased safety awareness;Cardiopulmonary status limiting activity       PT Treatment Interventions DME instruction;Gait training;Stair training;Functional mobility training;Therapeutic activities;Therapeutic exercise;Balance training;Patient/family education;Neuromuscular re-education;Cognitive remediation    PT Goals (Current goals can be found in the Care Plan section)  Acute Rehab PT Goals Patient Stated Goal: return home PT Goal Formulation: With patient Time For Goal Achievement: 06/09/16 Potential to Achieve Goals: Fair    Frequency Min 3X/week   Barriers to discharge        Co-evaluation               End of Session Equipment Utilized During Treatment: Gait belt Activity Tolerance: Patient tolerated treatment well Patient left: in bed;with call bell/phone within reach;with bed alarm set (sitting EOB) Nurse Communication: Mobility status PT Visit Diagnosis: Other abnormalities of gait and mobility (R26.89)    Time: 1308-6578 PT Time Calculation (min) (ACUTE ONLY): 20 min   Charges:   PT Evaluation $PT Eval Moderate Complexity: 1 Procedure     PT G Codes:        Sherie Don, PT, DPT [place] 05/26/2016, 2:54 PM

## 2016-05-26 NOTE — Progress Notes (Signed)
PROGRESS NOTE  BREWER HITCHMAN  UXL:[id number] DOB: 12/17/1945 DOA: 05/25/2016 PCP: [name], MD   Brief Narrative: Harry [name] is a 71 y.o. 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 Symptoms started around 3/9 when he began becoming more agitated, with decreased right side vision ultimately diagnosed as vitreous hemorrhage by [facility] ophthalmology. That day he had been prescribed metoprolol for BP of 215/123 in the ED. He's been experiencing hallucinations, increased agitation, decreased activity, and left-sided weakness/gaze preference intermittently since that time. On the day of presentation the patient and daughter were visiting the patient's wife in [facility] when he awoke violently confused and not as redirectable as usual, so she brought him to the ED where BP was 220/110. He denied having any symptoms. Neuroimaging showed considerable cerebrovascular disease without acute infarct on CT or subsequent MRI. IV antihypertensives were provided with adequate tightening of BP control.   Assessment & Plan: Principal Problem:   Hypertensive emergency Active Problems:   DM2 (diabetes mellitus, type 2) ([place])   Altered mental status   CKD (chronic kidney disease), stage III  Hypertensive emergency with encephalopathy: MRI without acute stroke.  - Plan to decrease BP by ~25% in 24 hours (goal ~170/80) to mitigate hypoperfusion risk. If trending well, will add norvasc low dose. Cr 1.6, so avoiding diuretic, ACE.  - Neurology consulted, no concern for PRES. No indication for further work up (e.g. EEG) at this time. Expect improvements in encephalopathy to lag behind improvements in underlying etiology due to severity of chronic CVD.  - Continue BP management with metoprolol po, increasing to 50mg  BID. Required  labetalol 10mg  IV prn x1 overnight. CCM did not feel cardene gtt was indicated.  - SLP evaluation, then start carb mod/heart healthy diet  Cerebrovascular disease: possibly beginng of vascular dementia: Risk factors include HTN, PAD, HLD, h/o CVA, DM, tobacco use, age. Other causes include visual impairment - Delirium precautions - Continue ASA, plavix - History of statin intolerance, will defer further management to PCP - PT/OT evaluation to aid with disposition planning.   Tobacco use:  - Nicotine patch ordered - Cessation counseling provided.   T2DM:  - Carb-mod diet - SSI, lantus 20u qHS may require addition of lantus (on 40u qHS basaglar at home, but currently NPO)  CKD stage III: Creatinine appears to be at baseline 1.6 on arrival.  - Monitor  DVT prophylaxis: SCDs Code Status: Full Family Communication: Daughter at bedside Disposition Plan: Anticipate DC to SNF based on pt's present level of functioning per daughter.   Consultants:   Neurology  CCM  Procedures:   None  Antimicrobials:  None   Subjective: Pt very hungry, irritable, refusing to answer orientation questions. No complaints.   Objective: Vitals:   05/26/16 0331 05/26/16 0400 05/26/16 0500 05/26/16 0800  BP: (!) 187/105 (!) 173/93 (!) 150/60 (!) 174/83  Pulse: 84 81 81 80  Resp: 16 19 19 13   Temp: 98.2 F (36.8 C)   98.2 F (36.8 C)  TempSrc: Oral   Oral  SpO2: 96% 99% (!) 84% 98%  Weight:      Height:       No intake or output data in the 24 hours ending 05/26/16 0948 Filed Weights   05/25/16 1650 05/26/16 0158  Weight: 90.7 kg (200 lb) 90.4  kg (199 lb 4.7 oz)    Examination: General exam: 71 y.o. male in no distress  Respiratory system: Non-labored breathing room air. Clear to auscultation bilaterally.  Cardiovascular system: Regular rate and rhythm. No murmur, rub, or gallop. No JVD, and no pedal edema. Gastrointestinal system: Abdomen soft, non-tender, non-distended, with  normoactive bowel sounds. No organomegaly or masses felt. Central nervous system: Alert. Significant visual acuity deficits, poorly responsive left pupil with corneal opacification. Suspect left gaze preference, though pt's cooperation may be cause of this, otherwise CN's grossly intact. Normal motor and sensation x4 extremities. Extremities: Warm, no deformities Skin: No rashes, lesions no ulcers Psychiatry: Judgement and insight appear poor. Oriented to person and DOB. Refuses to answer place/time questions. Flat affect.  Data Reviewed: I have personally reviewed following labs and imaging studies  CBC:  Recent Labs Lab 05/25/16 1655  WBC 5.4  HGB 11.3*  HCT 34.2*  MCV 93.2  PLT 275   Basic Metabolic Panel:  Recent Labs Lab 05/25/16 1655  NA 138  K 4.0  CL 105  CO2 26  GLUCOSE 199*  BUN 21*  CREATININE 1.60*  CALCIUM 8.7*   GFR: Estimated Creatinine Clearance: 47.8 mL/min (A) (by C-G formula based on SCr of 1.6 mg/dL (H)). Liver Function Tests:  Recent Labs Lab 05/25/16 1655  AST 17  ALT 18  ALKPHOS 66  BILITOT 1.1  PROT 6.9  ALBUMIN 3.4*   No results for input(s): LIPASE, AMYLASE in the last 168 hours. No results for input(s): AMMONIA in the last 168 hours. Coagulation Profile: No results for input(s): INR, PROTIME in the last 168 hours. Cardiac Enzymes: No results for input(s): CKTOTAL, CKMB, CKMBINDEX, TROPONINI in the last 168 hours. BNP (last 3 results) No results for input(s): PROBNP in the last 8760 hours. HbA1C: No results for input(s): HGBA1C in the last 72 hours. CBG:  Recent Labs Lab 05/25/16 2000 05/26/16 0252 05/26/16 0922  GLUCAP 145* 159* 65   Lipid Profile:  Recent Labs  05/26/16 0400  CHOL 122  HDL 31*  LDLCALC 76  TRIG 77  CHOLHDL 3.9   Thyroid Function Tests:  Recent Labs  05/26/16 0044  TSH 1.309   Anemia Panel: No results for input(s): VITAMINB12, FOLATE, FERRITIN, TIBC, IRON, RETICCTPCT in the last 72  hours. Urine analysis:    Component Value Date/Time   COLORURINE YELLOW 05/25/2016 2013   APPEARANCEUR CLEAR 05/25/2016 2013   LABSPEC 1.023 05/25/2016 2013   PHURINE 5.0 05/25/2016 2013   GLUCOSEU >=500 (A) 05/25/2016 2013   HGBUR NEGATIVE 05/25/2016 2013   [place] NEGATIVE 05/25/2016 2013   [place] NEGATIVE 05/25/2016 2013   PROTEINUR 30 (A) 05/25/2016 2013   UROBILINOGEN 0.2 10/09/2014 2112   NITRITE NEGATIVE 05/25/2016 2013   LEUKOCYTESUR NEGATIVE 05/25/2016 2013   Recent Results (from the past 240 hour(s))  MRSA PCR Screening     Status: None   Collection Time: 05/26/16  2:06 AM  Result Value Ref Range Status   MRSA by PCR NEGATIVE NEGATIVE Final    Comment:        The GeneXpert MRSA Assay (FDA approved for NASAL specimens only), is one component of a comprehensive MRSA colonization surveillance program. It is not intended to diagnose MRSA infection nor to guide or monitor treatment for MRSA infections.       Radiology Studies: Ct Head Wo Contrast  Result Date: 05/25/2016 CLINICAL DATA:  Acute onset of confusion and hallucinations. Shuffling gait. Initial encounter. EXAM: CT HEAD WITHOUT CONTRAST TECHNIQUE:  Contiguous axial images were obtained from the base of the skull through the vertex without intravenous contrast. COMPARISON:  CT of the head performed 05/11/2016 FINDINGS: Brain: No evidence of acute infarction, hemorrhage, hydrocephalus, extra-axial collection or mass lesion/mass effect. Prominence of the ventricles and sulci reflects mild cortical volume loss. Cerebellar atrophy is noted, with a large chronic infarct at the left cerebellar hemisphere. Mild periventricular white matter change likely reflects small vessel ischemic microangiopathy. The brainstem and fourth ventricle are within normal limits. The basal ganglia are unremarkable in appearance. The cerebral hemispheres demonstrate grossly normal gray-white differentiation. No mass effect or midline  shift is seen. Vascular: No hyperdense vessel or unexpected calcification. Skull: There is no evidence of fracture; visualized osseous structures are unremarkable in appearance. Sinuses/Orbits: The visualized portions of the orbits are within normal limits. The paranasal sinuses and mastoid air cells are well-aerated. Other: No significant soft tissue abnormalities are seen. IMPRESSION: 1. No acute intracranial pathology seen on CT. 2. Mild cortical volume loss and scattered small vessel ischemic microangiopathy. 3. Large chronic infarct at the left cerebellar hemisphere. Electronically Signed   By: Garald Balding M.D.   On: 05/25/2016 21:14   Mr Brain Wo Contrast  Result Date: 05/25/2016 CLINICAL DATA:  Initial evaluation for acute confusion, hypertension. EXAM: MRI HEAD WITHOUT CONTRAST TECHNIQUE: Multiplanar, multiecho pulse sequences of the brain and surrounding structures were obtained without intravenous contrast. COMPARISON:  Prior CT from earlier the same day. FINDINGS: Brain: Diffuse prominence of the CSF containing spaces is compatible with generalized age-related cerebral atrophy. Multifocal moderate to large remote left cerebellar infarcts. Additional smaller scatter remote right cerebellar infarcts. Scattered areas of encephalomalacia within the bilateral parieto-occipital regions compatible with remote ischemic infarcts as well. Remote lacunar infarcts present within knee left thalamus and pons. Superimposed moderate chronic microvascular ischemic changes within the thalami and pons, as well is within the periventricular white matter. Overall, changes are progressed relative to previous MRI from 12/03/2010. Few scattered superimposed foci of susceptibility artifact noted, compatible with small chronic micro hemorrhages, most likely related to chronic underlying hypertension. No abnormal foci of restricted diffusion to suggest acute or subacute ischemia. Gray-white matter differentiation maintained.  No findings to suggest PRES. No evidence for acute intracranial hemorrhage. No mass lesion, midline shift or mass effect. No hydrocephalus. No extra-axial fluid collection. Major dural sinuses are grossly patent. Pituitary gland suprasellar region within normal limits. Midline structures intact and normal. Vascular: Major intracranial vascular flow voids maintained. Skull and upper cervical spine: Craniocervical junction within normal limits. Mild degenerative spondylolysis noted within the upper cervical spine without significant stenosis. Bone marrow signal intensity within normal limits. No scalp soft tissue abnormality. Sinuses/Orbits: Globes and orbital soft tissues within normal limits. Patient status post lens extraction bilaterally. Scattered mucosal thickening within the paranasal sinuses. No air-fluid level to suggest active sinus infection. No mastoid effusion. Inner ear structures normal. Other: No other significant finding. IMPRESSION: 1. No acute intracranial process identified. 2. Scattered remote infarcts involving the bilateral parieto-occipital regions and bilateral cerebellar hemispheres, with remote lacunar infarcts involving the thalami and pons. Changes have progressed relative to most recent brain MRI from 12/03/2010. Superimposed chronic microvascular ischemic changes also progressed. 3. Scattered chronic micro hemorrhages involving the parieto-occipital regions, left thalamus, pons, and cerebellum, most consistent with chronic underlying hypertension. Electronically Signed   By: Jeannine Boga M.D.   On: 05/25/2016 23:24    Scheduled Meds: . aspirin EC  81 mg Oral Daily  . clopidogrel  75 mg  Oral Q breakfast  . gabapentin  300 mg Oral Daily  . insulin aspart  0-15 Units Subcutaneous TID WC  . insulin aspart  0-5 Units Subcutaneous QHS  . insulin glargine  20 Units Subcutaneous QHS  . [START ON 05/27/2016] metoprolol  25 mg Oral BID   Continuous Infusions:   LOS: 0 days    Time spent: 25 minutes.  Harry Gather, MD [facility] Pager [phone]  If 7PM-7AM, please contact night-coverage [email or web address] Password [facility] 05/26/2016, 9:48 AM

## 2016-05-26 NOTE — ED Notes (Signed)
CCM came to see pt, states pt does not need cardene drip or ICU management

## 2016-05-26 NOTE — Progress Notes (Signed)
[name] daughter wouldl ike to create a password- [phone]
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
## 2016-05-27 LAB — BASIC METABOLIC PANEL
Anion gap: 8 (ref 5–15)
BUN: 17 mg/dL (ref 6–20)
CO2: 26 mmol/L (ref 22–32)
Calcium: 8.7 mg/dL — ABNORMAL LOW (ref 8.9–10.3)
Chloride: 106 mmol/L (ref 101–111)
Creatinine, Ser: 1.48 mg/dL — ABNORMAL HIGH (ref 0.61–1.24)
GFR calc Af Amer: 54 mL/min — ABNORMAL LOW (ref >60)
GFR calc non Af Amer: 47 mL/min — ABNORMAL LOW (ref >60)
Glucose, Bld: 128 mg/dL — ABNORMAL HIGH (ref 65–99)
Potassium: 3.5 mmol/L (ref 3.5–5.1)
Sodium: 140 mmol/L (ref 135–145)

## 2016-05-27 LAB — GLUCOSE, CAPILLARY: Glucose-Capillary: 121 mg/dL — ABNORMAL HIGH (ref 65–99)

## 2016-05-27 LAB — HEMOGLOBIN A1C
Hgb A1c MFr Bld: 7.3 % — ABNORMAL HIGH (ref 4.8–5.6)
Mean Plasma Glucose: 163 mg/dL

## 2016-05-27 MED ORDER — AMLODIPINE BESYLATE 2.5 MG PO TABS: 2.5000 mg | ORAL_TABLET | Freq: Every day | ORAL | 0 refills | Status: DC

## 2016-05-27 MED ORDER — METOPROLOL TARTRATE 50 MG PO TABS: 50.0000 mg | ORAL_TABLET | Freq: Two times a day (BID) | ORAL | 0 refills | Status: DC

## 2016-05-27 NOTE — Progress Notes (Signed)
Attempted report to [facility] [place]

## 2016-05-27 NOTE — Clinical Social Work Note (Signed)
Clinical Social Work Assessment  Patient Details  Name: Harry [name] MRN: [id number] Date of Birth: 1945-09-14  Date of referral:  05/27/16               Reason for consult:  Facility Placement, Discharge Planning                Permission sought to share information with:  Family Supports Permission granted to share information::  Yes, Verbal Permission Granted  Name::     [name]  Relationship::  daughter  Contact Information:  [phone]  Housing/Transportation Living arrangements for the past 2 months:  Apartment Source of Information:  Patient, Adult Children Patient Interpreter Needed:  None Criminal Activity/Legal Involvement Pertinent to Current Situation/Hospitalization:  No - Comment as needed Significant Relationships:  Adult Children, Other Family Members, Spouse Lives with:  Adult Children, Spouse Do you feel safe going back to the place where you live?  Yes Need for family participation in patient care:  Yes (Comment)  Care giving concerns:  Pt would benefit from placement, however does not have a 3 night inpatient qualifying stay and is unable to pay privately.   Social Worker assessment / plan:  CSW spoke with pt to address consult. CSW introduced herself and explained role of social work. CSW explained the process of discharging to SNF as recommended by PT. Pt does not want rehab placement. Pt also has not has a 3 night inpatient qualifying stay as needed by Medicare for STR coverage. Per pt, pt's daughter would not be able to pay privately. With pt's permission CSW spoke to pt's daughter, [name], regarding discharge plan. Pt lives at [place] with daughter and her husband as well as his spouse. Per [name], pt has become harder and harder to manage at home however is not able pay privately. Pt's daughter understands the requirements for [place] to cover STR. CSW offered home health services. CSW also provided supportive counseling around care taking stressors and addressed  the possible need for ALF placement, even though pt's daughter promised that she would not place her parents in a facility.   CSW updated MD, RNCM, and bedside RN of discharge plan. CSW will sign off as no further needs identified.   Employment status:  Retired Forensic scientist:  Medicare PT Recommendations:  [place] / Referral to community resources:   (Referral to case management. )  Patient/Family's Response to care:  Pt's daughter was appreciative of CSW support.   Patient/Family's Understanding of and Emotional Response to Diagnosis, Current Treatment, and Prognosis:  Pt's daughter expressed an understanding of placement barriers.   Emotional Assessment Appearance:  Appears stated age Attitude/Demeanor/Rapport:   (Appropriate) Affect (typically observed):  Blunt Orientation:  Oriented to Self, Oriented to Place, Oriented to Situation Alcohol / Substance use:  Not Applicable Psych involvement (Current and /or in the community):  No (Comment)  Discharge Needs  Concerns to be addressed:  Patient refuses services, Care Coordination, Adjustment to Illness Readmission within the last 30 days:  No Current discharge risk:  Chronically ill Barriers to Discharge:  No Barriers Identified   Darden Dates, LCSW 05/27/2016, 12:12 PM

## 2016-05-27 NOTE — Progress Notes (Signed)
Called report to [facility] [place] spoke to Severy Pt transferred to [facility] bed 14 per w/c with belongings

## 2016-05-27 NOTE — Progress Notes (Signed)
Pt transported to [facility] bed 14 by RN with belongings in recliner chair

## 2016-05-27 NOTE — Care Management Important Message (Signed)
Important Message  Patient Details  Name: Harry [name] MRN: [id number] Date of Birth: 10-22-1945   Medicare Important Message Given:  Yes (spoke with [name], RN who has requested I leave the IM in the room as family may appeal)    Dellie Catholic, RN 05/27/2016, 3:44 PM

## 2016-05-27 NOTE — Discharge Summary (Signed)
Physician Discharge Summary  Harry [name] FIE:[id number] DOB: [birth year] DOA: 05/25/2016  PCP: [name], MD  Admit date: 05/25/2016 Discharge date: 05/27/2016  Admitted From: Home Disposition: Home with home health (SNF recommended but not a financial option for family)  Recommendations for Outpatient Follow-up:  1. Follow up with PCP in 1-2 weeks 2. Monitor blood pressure: Admitted with hypertensive encephalopathy, increased metoprolol to 50mg  BID (from 25mg  BID) and added low dose norvasc, will discharge on 2.5mg  daily.  3. Please obtain BMP/CBC in one week 4. Recommend outpatient follow up with neurology.  Home Health: PT, OT, RN, Aide, CSW Equipment/Devices: Rolling walker with 5" wheels Discharge Condition: Stable CODE STATUS: Full Diet recommendation: Heart healthy, carbohydrate-limited.  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 That day he had been prescribed metoprolol for BP of 215/123 in the ED. He's been experiencing hallucinations, increased agitation, decreased activity, and left-sided weakness/gaze preference intermittently since that time. On the day of presentation the patient and daughter were visiting the patient's wife in [facility] when he awoke violently confused and not as redirectable as usual, so she brought him to the ED where BP was 220/110. He denied having any symptoms. Neuroimaging showed considerable cerebrovascular disease without acute infarct on CT or subsequent MRI. IV antihypertensives were provided with adequate tightening of BP control. Acute encephalopathy  quickly resolved and the patient is alert and oriented. Physical therapy has recommended disposition to SNF, though the patient's family is unable to pay for this and insurance would require a 72-hour stay in the hospital which is, unfortunately, not indicated. As such, he is discharged in stable condition to the care of his daughter and son-in-law with orders for significant home health assistance as above.   Discharge Diagnoses:  Principal Problem:   Hypertensive emergency Active Problems:   DM2 (diabetes mellitus, type 2) ([place])   Altered mental status   CKD (chronic kidney disease), stage III  Hypertensive emergency with encephalopathy: Acute encephalopathy appears to have resolved. MRI without acute stroke. BP was decreased by ~25% in first 24 hours and further afterward to mitigate hypoperfusion risk, with no resulting symptoms of hypoperfusion. Cr 1.6, so avoided diuretic, ACE. - Added norvasc 5mg , BP nadir at 95/63 overnight from unvalidated device data, so will discharge on 2.5mg  daily.  - Increased metoprolol to 50mg  BID. HR maintaining 70-80's. BP 124/69.   - Neurology consulted, no concern for PRES. No indication for further work up (e.g. EEG) at this time.   Mild aspiration risk: SLP recommendations: Regular;Thin liquid  Liquid Administration via: Cup;Straw Medication Administration: Whole meds with liquid Supervision: Staff to assist with self feeding Compensations: Slow rate;Small sips/bites Postural Changes: Seated upright at 90 degrees   Cerebrovascular disease: possibly beginng of vascular dementia: Risk factors include HTN, PAD, HLD, h/o CVA, DM, tobacco use, age. Other causes include visual impairment.  - Delirium precautions - Continue ASA, plavix - History of statin intolerance, though pt taking crestor at home which was continued.  - PT/OT: Recommend DME and SNF as above. Home health ordered and arranged by [facility] prior to discharge.   Tobacco use:  - Nicotine  patch ordered -  Cessation counseling provided.   T2DM:  - Carb-mod diet - Lantus 20u qHS shile pt NPO and SSI given with CBG control within inpatient goal. Restart home medications on discharge.   CKD stage III: Creatinine appears to be at baseline 1.6 on arrival, improved to 1.48.  - Monitor at follow up  Discharge Instructions Discharge Instructions    Discharge instructions    Complete by:  As directed    You were admitted with uncontrolled high blood pressure that has been treated with improvement in mental status. Neuroimaging was negative for acute stroke. Neurology has recommended careful observation and to follow up as you were scheduled as an outpatient. You are stable for discharge with the following recommendations:  - Increase metoprolol from 25mg  (1/2 tablet) twice daily to 50mg  (1 tab) twice daily - Start taking norvasc 2.5mg  daily - Follow up with your doctor in the next 1 - 2 weeks to recheck blood pressure and labs.  - You will receive home health services: nurse, aide, PT, OT, and social worker as an outpatient in an effort to provide as much support as possible for you and your family.  - If your symptoms return, seek medical attention right away.     Allergies as of 05/27/2016      Reactions   Lipitor [atorvastatin] Other (See Comments)   myalgia   Pravachol [pravastatin] Rash      Medication List    TAKE these medications   amLODipine 2.5 MG tablet Commonly known as:  NORVASC Take 1 tablet (2.5 mg total) by mouth daily. Start taking on:  05/28/2016   aspirin 81 MG EC tablet Take 1 tablet (81 mg total) by mouth daily.   BASAGLAR KWIKPEN 100 UNIT/ML Sopn Inject 20 Units into the skin 2 (two) times daily.   clopidogrel 75 MG tablet Commonly known as:  PLAVIX TAKE 1 TABLET (75 MG TOTAL) BY MOUTH DAILY WITH BREAKFAST.   gabapentin 300 MG capsule Commonly known as:  NEURONTIN Take 300 mg by mouth 2 (two) times daily. Reported on 02/25/2015    metoprolol 50 MG tablet Commonly known as:  LOPRESSOR Take 1 tablet (50 mg total) by mouth 2 (two) times daily. What changed:  how much to take   rosuvastatin 5 MG tablet Commonly known as:  CRESTOR Take 5 mg by mouth at bedtime.            Durable Medical Equipment        Start     Ordered   05/27/16 1212  For home use only DME Walker rolling  Once    Question Answer Comment  Patient needs a walker to treat with the following condition Gait abnormality   Patient needs a walker to treat with the following condition Blindness and low vision      05/27/16 1212     Follow-up Information    FANTA,[name], MD. Schedule an appointment as soon as possible for a visit.   Specialty:  Internal Medicine Contact information: [place] [address] [phone]          Allergies  Allergen Reactions  . Lipitor [Atorvastatin] Other (See Comments)    myalgia  . Pravachol [Pravastatin] Rash    Consultations:  CCM  Neurology, Dr. Shon Hale  Procedures/Studies: Ct Head Wo Contrast  Result Date: 05/25/2016 CLINICAL DATA:  Acute onset of confusion and hallucinations. Shuffling gait. Initial encounter. EXAM: CT HEAD WITHOUT CONTRAST TECHNIQUE: Contiguous axial images were obtained from the base of the  skull through the vertex without intravenous contrast. COMPARISON:  CT of the head performed 05/11/2016 FINDINGS: Brain: No evidence of acute infarction, hemorrhage, hydrocephalus, extra-axial collection or mass lesion/mass effect. Prominence of the ventricles and sulci reflects mild cortical volume loss. Cerebellar atrophy is noted, with a large chronic infarct at the left cerebellar hemisphere. Mild periventricular white matter change likely reflects small vessel ischemic microangiopathy. The brainstem and fourth ventricle are within normal limits. The basal ganglia are unremarkable in appearance. The cerebral hemispheres demonstrate grossly normal gray-white  differentiation. No mass effect or midline shift is seen. Vascular: No hyperdense vessel or unexpected calcification. Skull: There is no evidence of fracture; visualized osseous structures are unremarkable in appearance. Sinuses/Orbits: The visualized portions of the orbits are within normal limits. The paranasal sinuses and mastoid air cells are well-aerated. Other: No significant soft tissue abnormalities are seen. IMPRESSION: 1. No acute intracranial pathology seen on CT. 2. Mild cortical volume loss and scattered small vessel ischemic microangiopathy. 3. Large chronic infarct at the left cerebellar hemisphere. Electronically Signed   By: Garald Balding M.D.   On: 05/25/2016 21:14   Ct Head Wo Contrast  Result Date: 05/11/2016 CLINICAL DATA:  Right eye visual loss since yesterday. EXAM: CT HEAD WITHOUT CONTRAST TECHNIQUE: Contiguous axial images were obtained from the base of the skull through the vertex without intravenous contrast. COMPARISON:  CT head without contrast 12/16/2015. FINDINGS: Brain: Remote bilateral thalamic infarcts and left occipital lobe infarct are stable. Remote infarcts of the medial right parietal and occipital lobe are also stable. Remote cerebellar infarcts are stable, left greater than right. Diffuse white matter changes are similar to the prior exam. No acute cortical infarct is present. The ventricles are proportionate to the degree of atrophy. No significant extra-axial fluid collection is present. Vascular: Atherosclerotic calcifications are present in the cavernous internal carotid arteries bilaterally. There is no hyperdense vessel. Calcifications are also present at the dural margin of the vertebral arteries. Skull: The calvarium is within normal limits. No focal lytic or blastic lesions are present. Sinuses/Orbits: The paranasal sinuses and mastoid air cells are clear. Bilateral lens replacements are present. The globes and orbits are otherwise within normal limits.  IMPRESSION: 1. Multiple remote posterior circulation infarcts are stable. 2. Stable atrophy and white matter disease. 3. No acute intracranial abnormality. Electronically Signed   By: San Morelle M.D.   On: 05/11/2016 18:51   Mr Brain Wo Contrast  Result Date: 05/25/2016 CLINICAL DATA:  Initial evaluation for acute confusion, hypertension. EXAM: MRI HEAD WITHOUT CONTRAST TECHNIQUE: Multiplanar, multiecho pulse sequences of the brain and surrounding structures were obtained without intravenous contrast. COMPARISON:  Prior CT from earlier the same day. FINDINGS: Brain: Diffuse prominence of the CSF containing spaces is compatible with generalized age-related cerebral atrophy. Multifocal moderate to large remote left cerebellar infarcts. Additional smaller scatter remote right cerebellar infarcts. Scattered areas of encephalomalacia within the bilateral parieto-occipital regions compatible with remote ischemic infarcts as well. Remote lacunar infarcts present within knee left thalamus and pons. Superimposed moderate chronic microvascular ischemic changes within the thalami and pons, as well is within the periventricular white matter. Overall, changes are progressed relative to previous MRI from 12/03/2010. Few scattered superimposed foci of susceptibility artifact noted, compatible with small chronic micro hemorrhages, most likely related to chronic underlying hypertension. No abnormal foci of restricted diffusion to suggest acute or subacute ischemia. Gray-white matter differentiation maintained. No findings to suggest PRES. No evidence for acute intracranial hemorrhage. No mass lesion, midline shift or  mass effect. No hydrocephalus. No extra-axial fluid collection. Major dural sinuses are grossly patent. Pituitary gland suprasellar region within normal limits. Midline structures intact and normal. Vascular: Major intracranial vascular flow voids maintained. Skull and upper cervical spine: Craniocervical  junction within normal limits. Mild degenerative spondylolysis noted within the upper cervical spine without significant stenosis. Bone marrow signal intensity within normal limits. No scalp soft tissue abnormality. Sinuses/Orbits: Globes and orbital soft tissues within normal limits. Patient status post lens extraction bilaterally. Scattered mucosal thickening within the paranasal sinuses. No air-fluid level to suggest active sinus infection. No mastoid effusion. Inner ear structures normal. Other: No other significant finding. IMPRESSION: 1. No acute intracranial process identified. 2. Scattered remote infarcts involving the bilateral parieto-occipital regions and bilateral cerebellar hemispheres, with remote lacunar infarcts involving the thalami and pons. Changes have progressed relative to most recent brain MRI from 12/03/2010. Superimposed chronic microvascular ischemic changes also progressed. 3. Scattered chronic micro hemorrhages involving the parieto-occipital regions, left thalamus, pons, and cerebellum, most consistent with chronic underlying hypertension. Electronically Signed   By: Jeannine Boga M.D.   On: 05/25/2016 23:24   Subjective: Patient much more alert, oriented today. Sitting on chair, eating without issues. No overnight events.   Discharge Exam: BP 124/69 (BP Location: Left Arm)   Pulse 71   Temp 98.4 F (36.9 C) (Oral)   Resp 12   Ht 6' (1.829 m)   Wt 90.4 kg (199 lb 4.7 oz)   SpO2 99%   BMI 27.03 kg/m   General: 71yo male in no distress Cardiovascular: RRR, S1/S2 +, no rubs, no gallops Respiratory: CTA bilaterally, no wheezing, no rhonchi Abdominal: Soft, NT, ND, bowel sounds + Neuro: Alert and oriented. Significant visual acuity deficits, poorly responsive left pupil with corneal opacification. Otherwise, CN's grossly intact. Normal motor and sensation x4 extremities.  Labs: Basic Metabolic Panel:  Recent Labs Lab 05/25/16 1655 05/27/16 0755  NA 138 140   K 4.0 3.5  CL 105 106  CO2 26 26  GLUCOSE 199* 128*  BUN 21* 17  CREATININE 1.60* 1.48*  CALCIUM 8.7* 8.7*   Liver Function Tests:  Recent Labs Lab 05/25/16 1655  AST 17  ALT 18  ALKPHOS 66  BILITOT 1.1  PROT 6.9  ALBUMIN 3.4*   CBC:  Recent Labs Lab 05/25/16 1655  WBC 5.4  HGB 11.3*  HCT 34.2*  MCV 93.2  PLT 235   CBG:  Recent Labs Lab 05/26/16 0922 05/26/16 1313 05/26/16 1724 05/26/16 2200 05/27/16 1217  GLUCAP 65 108* 147* 194* 121*   Lipid Profile  Recent Labs  05/26/16 0400  CHOL 122  HDL 31*  LDLCALC 76  TRIG 77  CHOLHDL 3.9   Thyroid function studies  Recent Labs  05/26/16 0044  TSH 1.309   Urinalysis    Component Value Date/Time   COLORURINE YELLOW 05/25/2016 2013   APPEARANCEUR CLEAR 05/25/2016 2013   LABSPEC 1.023 05/25/2016 2013   PHURINE 5.0 05/25/2016 2013   GLUCOSEU >=500 (A) 05/25/2016 2013   HGBUR NEGATIVE 05/25/2016 2013   [place] NEGATIVE 05/25/2016 2013   [place] NEGATIVE 05/25/2016 2013   PROTEINUR 30 (A) 05/25/2016 2013   UROBILINOGEN 0.2 10/09/2014 2112   NITRITE NEGATIVE 05/25/2016 2013   LEUKOCYTESUR NEGATIVE 05/25/2016 2013    Microbiology Recent Results (from the past 240 hour(s))  MRSA PCR Screening     Status: None   Collection Time: 05/26/16  2:06 AM  Result Value Ref Range Status   MRSA by PCR NEGATIVE  NEGATIVE Final    Comment:        The GeneXpert MRSA Assay (FDA approved for NASAL specimens only), is one component of a comprehensive MRSA colonization surveillance program. It is not intended to diagnose MRSA infection nor to guide or monitor treatment for MRSA infections.     Time coordinating discharge: Approximately 40 minutes  Vance Gather, MD  [facility]  05/27/2016, 12:29 PM Pager [phone]

## 2016-05-27 NOTE — Care Management Note (Signed)
Case Management Note  Patient Details  Name: Harry [name] MRN: [id number] Date of Birth: 05/30/1945  Subjective/Objective:                  accelerated hypertension Action/Plan: Discharge planning Expected Discharge Date:  05/27/16               Expected Discharge Plan:  [place]  In-House Referral:     Discharge planning Services  CM Consult  Post Acute Care Choice:    Choice offered to:  Adult Children  DME Arranged:  Shower stool DME Agency:  [place] Arranged:  RN, PT, OT, Nurse's Aide, Social Work [employer] Agency:  [place]  Status of Service:  Completed, signed off  If discussed at [employer], dates discussed:    Additional Comments: Cm spoke with daughter Harry [name], Therapist, sports (used to work 3[employer]) and mother is with [facility] and she wishes her father to have same agency. Shower stool being delivered by [facility] rep, Harry [name] to room.  Referral for [facility] services given to [place] of [facility] . IM is in room bc Harry [name] feels her father is not at baseline and they may appeal.  Harry [name] plans to pick her father up late tonight after her shift at [employer].  No other CM needs were communicated. Dellie Catholic, RN 05/27/2016, 3:54 PM

## 2016-05-29 DIAGNOSIS — Z7902 Long term (current) use of antithrombotics/antiplatelets: Secondary | ICD-10-CM | POA: Diagnosis not present

## 2016-05-29 DIAGNOSIS — H547 Unspecified visual loss: Secondary | ICD-10-CM | POA: Diagnosis not present

## 2016-05-29 DIAGNOSIS — Z8673 Personal history of transient ischemic attack (TIA), and cerebral infarction without residual deficits: Secondary | ICD-10-CM | POA: Diagnosis not present

## 2016-05-29 DIAGNOSIS — G9340 Encephalopathy, unspecified: Secondary | ICD-10-CM | POA: Diagnosis not present

## 2016-05-29 DIAGNOSIS — E1151 Type 2 diabetes mellitus with diabetic peripheral angiopathy without gangrene: Secondary | ICD-10-CM | POA: Diagnosis not present

## 2016-05-29 DIAGNOSIS — Z7982 Long term (current) use of aspirin: Secondary | ICD-10-CM | POA: Diagnosis not present

## 2016-05-29 DIAGNOSIS — E1122 Type 2 diabetes mellitus with diabetic chronic kidney disease: Secondary | ICD-10-CM | POA: Diagnosis not present

## 2016-05-29 DIAGNOSIS — E1142 Type 2 diabetes mellitus with diabetic polyneuropathy: Secondary | ICD-10-CM | POA: Diagnosis not present

## 2016-05-29 DIAGNOSIS — N183 Chronic kidney disease, stage 3 (moderate): Secondary | ICD-10-CM | POA: Diagnosis not present

## 2016-05-29 DIAGNOSIS — I161 Hypertensive emergency: Secondary | ICD-10-CM | POA: Diagnosis not present

## 2016-05-29 DIAGNOSIS — F17210 Nicotine dependence, cigarettes, uncomplicated: Secondary | ICD-10-CM | POA: Diagnosis not present

## 2016-05-29 DIAGNOSIS — I129 Hypertensive chronic kidney disease with stage 1 through stage 4 chronic kidney disease, or unspecified chronic kidney disease: Secondary | ICD-10-CM | POA: Diagnosis not present

## 2016-05-30 DIAGNOSIS — I129 Hypertensive chronic kidney disease with stage 1 through stage 4 chronic kidney disease, or unspecified chronic kidney disease: Secondary | ICD-10-CM | POA: Diagnosis not present

## 2016-05-30 DIAGNOSIS — N183 Chronic kidney disease, stage 3 (moderate): Secondary | ICD-10-CM | POA: Diagnosis not present

## 2016-05-30 DIAGNOSIS — I161 Hypertensive emergency: Secondary | ICD-10-CM | POA: Diagnosis not present

## 2016-05-30 DIAGNOSIS — E1122 Type 2 diabetes mellitus with diabetic chronic kidney disease: Secondary | ICD-10-CM | POA: Diagnosis not present

## 2016-05-30 DIAGNOSIS — G9340 Encephalopathy, unspecified: Secondary | ICD-10-CM | POA: Diagnosis not present

## 2016-05-30 DIAGNOSIS — E1142 Type 2 diabetes mellitus with diabetic polyneuropathy: Secondary | ICD-10-CM | POA: Diagnosis not present

## 2016-05-31 DIAGNOSIS — E1142 Type 2 diabetes mellitus with diabetic polyneuropathy: Secondary | ICD-10-CM | POA: Diagnosis not present

## 2016-05-31 DIAGNOSIS — E1122 Type 2 diabetes mellitus with diabetic chronic kidney disease: Secondary | ICD-10-CM | POA: Diagnosis not present

## 2016-05-31 DIAGNOSIS — I129 Hypertensive chronic kidney disease with stage 1 through stage 4 chronic kidney disease, or unspecified chronic kidney disease: Secondary | ICD-10-CM | POA: Diagnosis not present

## 2016-05-31 DIAGNOSIS — I161 Hypertensive emergency: Secondary | ICD-10-CM | POA: Diagnosis not present

## 2016-05-31 DIAGNOSIS — N183 Chronic kidney disease, stage 3 (moderate): Secondary | ICD-10-CM | POA: Diagnosis not present

## 2016-05-31 DIAGNOSIS — G9340 Encephalopathy, unspecified: Secondary | ICD-10-CM | POA: Diagnosis not present

## 2016-06-01 DIAGNOSIS — N183 Chronic kidney disease, stage 3 (moderate): Secondary | ICD-10-CM | POA: Diagnosis not present

## 2016-06-01 DIAGNOSIS — I129 Hypertensive chronic kidney disease with stage 1 through stage 4 chronic kidney disease, or unspecified chronic kidney disease: Secondary | ICD-10-CM | POA: Diagnosis not present

## 2016-06-01 DIAGNOSIS — I161 Hypertensive emergency: Secondary | ICD-10-CM | POA: Diagnosis not present

## 2016-06-01 DIAGNOSIS — E1122 Type 2 diabetes mellitus with diabetic chronic kidney disease: Secondary | ICD-10-CM | POA: Diagnosis not present

## 2016-06-01 DIAGNOSIS — G9340 Encephalopathy, unspecified: Secondary | ICD-10-CM | POA: Diagnosis not present

## 2016-06-01 DIAGNOSIS — E1142 Type 2 diabetes mellitus with diabetic polyneuropathy: Secondary | ICD-10-CM | POA: Diagnosis not present

## 2016-06-04 DIAGNOSIS — I10 Essential (primary) hypertension: Secondary | ICD-10-CM | POA: Diagnosis not present

## 2016-06-04 DIAGNOSIS — N183 Chronic kidney disease, stage 3 (moderate): Secondary | ICD-10-CM | POA: Diagnosis not present

## 2016-06-04 DIAGNOSIS — E1165 Type 2 diabetes mellitus with hyperglycemia: Secondary | ICD-10-CM | POA: Diagnosis not present

## 2016-06-04 DIAGNOSIS — H547 Unspecified visual loss: Secondary | ICD-10-CM | POA: Diagnosis not present

## 2016-06-05 ENCOUNTER — Emergency Department (HOSPITAL_COMMUNITY): Payer: Medicare Other

## 2016-06-05 ENCOUNTER — Encounter (HOSPITAL_COMMUNITY): Payer: Self-pay

## 2016-06-05 ENCOUNTER — Observation Stay (HOSPITAL_COMMUNITY)
Admission: EM | Admit: 2016-06-05 | Discharge: 2016-06-06 | Disposition: A | Discharge status: Home / Self Care | Payer: Medicare Other | Attending: Internal Medicine | Admitting: Internal Medicine

## 2016-06-05 DIAGNOSIS — Z7902 Long term (current) use of antithrombotics/antiplatelets: Secondary | ICD-10-CM | POA: Diagnosis not present

## 2016-06-05 DIAGNOSIS — I639 Cerebral infarction, unspecified: Secondary | ICD-10-CM | POA: Diagnosis present

## 2016-06-05 DIAGNOSIS — G3183 Dementia with Lewy bodies: Secondary | ICD-10-CM | POA: Insufficient documentation

## 2016-06-05 DIAGNOSIS — E7800 Pure hypercholesterolemia, unspecified: Secondary | ICD-10-CM | POA: Insufficient documentation

## 2016-06-05 DIAGNOSIS — I69354 Hemiplegia and hemiparesis following cerebral infarction affecting left non-dominant side: Secondary | ICD-10-CM | POA: Insufficient documentation

## 2016-06-05 DIAGNOSIS — I129 Hypertensive chronic kidney disease with stage 1 through stage 4 chronic kidney disease, or unspecified chronic kidney disease: Secondary | ICD-10-CM | POA: Diagnosis not present

## 2016-06-05 DIAGNOSIS — R55 Syncope and collapse: Secondary | ICD-10-CM | POA: Diagnosis present

## 2016-06-05 DIAGNOSIS — N179 Acute kidney failure, unspecified: Secondary | ICD-10-CM | POA: Diagnosis not present

## 2016-06-05 DIAGNOSIS — N1831 Chronic kidney disease, stage 3a: Secondary | ICD-10-CM | POA: Diagnosis present

## 2016-06-05 DIAGNOSIS — N183 Chronic kidney disease, stage 3 unspecified: Secondary | ICD-10-CM

## 2016-06-05 DIAGNOSIS — I161 Hypertensive emergency: Secondary | ICD-10-CM | POA: Diagnosis not present

## 2016-06-05 DIAGNOSIS — E1142 Type 2 diabetes mellitus with diabetic polyneuropathy: Secondary | ICD-10-CM | POA: Insufficient documentation

## 2016-06-05 DIAGNOSIS — Z888 Allergy status to other drugs, medicaments and biological substances status: Secondary | ICD-10-CM | POA: Insufficient documentation

## 2016-06-05 DIAGNOSIS — F0280 Dementia in other diseases classified elsewhere without behavioral disturbance: Secondary | ICD-10-CM | POA: Diagnosis present

## 2016-06-05 DIAGNOSIS — E1169 Type 2 diabetes mellitus with other specified complication: Secondary | ICD-10-CM

## 2016-06-05 DIAGNOSIS — Z794 Long term (current) use of insulin: Secondary | ICD-10-CM | POA: Diagnosis not present

## 2016-06-05 DIAGNOSIS — Z8673 Personal history of transient ischemic attack (TIA), and cerebral infarction without residual deficits: Secondary | ICD-10-CM | POA: Diagnosis present

## 2016-06-05 DIAGNOSIS — I10 Essential (primary) hypertension: Secondary | ICD-10-CM | POA: Diagnosis present

## 2016-06-05 DIAGNOSIS — Z79899 Other long term (current) drug therapy: Secondary | ICD-10-CM | POA: Insufficient documentation

## 2016-06-05 DIAGNOSIS — E1122 Type 2 diabetes mellitus with diabetic chronic kidney disease: Secondary | ICD-10-CM | POA: Diagnosis not present

## 2016-06-05 DIAGNOSIS — F17200 Nicotine dependence, unspecified, uncomplicated: Secondary | ICD-10-CM | POA: Diagnosis present

## 2016-06-05 DIAGNOSIS — G9340 Encephalopathy, unspecified: Secondary | ICD-10-CM | POA: Diagnosis not present

## 2016-06-05 DIAGNOSIS — E785 Hyperlipidemia, unspecified: Secondary | ICD-10-CM | POA: Diagnosis not present

## 2016-06-05 DIAGNOSIS — R2689 Other abnormalities of gait and mobility: Secondary | ICD-10-CM | POA: Diagnosis not present

## 2016-06-05 DIAGNOSIS — N189 Chronic kidney disease, unspecified: Secondary | ICD-10-CM | POA: Diagnosis present

## 2016-06-05 DIAGNOSIS — I959 Hypotension, unspecified: Secondary | ICD-10-CM | POA: Diagnosis not present

## 2016-06-05 DIAGNOSIS — R6889 Other general symptoms and signs: Secondary | ICD-10-CM

## 2016-06-05 DIAGNOSIS — F17210 Nicotine dependence, cigarettes, uncomplicated: Secondary | ICD-10-CM | POA: Diagnosis not present

## 2016-06-05 DIAGNOSIS — Z7982 Long term (current) use of aspirin: Secondary | ICD-10-CM | POA: Diagnosis not present

## 2016-06-05 DIAGNOSIS — R41840 Attention and concentration deficit: Secondary | ICD-10-CM | POA: Diagnosis not present

## 2016-06-05 LAB — CBC
HCT: 33.5 % — ABNORMAL LOW (ref 39.0–52.0)
Hemoglobin: 11 g/dL — ABNORMAL LOW (ref 13.0–17.0)
MCH: 30.4 pg (ref 26.0–34.0)
MCHC: 32.8 g/dL (ref 30.0–36.0)
MCV: 92.5 fL (ref 78.0–100.0)
Platelets: 228 10*3/uL (ref 150–400)
RBC: 3.62 MIL/uL — ABNORMAL LOW (ref 4.22–5.81)
RDW: 13.7 % (ref 11.5–15.5)
WBC: 4.9 10*3/uL (ref 4.0–10.5)

## 2016-06-05 LAB — BASIC METABOLIC PANEL
Anion gap: 9 (ref 5–15)
BUN: 29 mg/dL — ABNORMAL HIGH (ref 6–20)
CO2: 23 mmol/L (ref 22–32)
Calcium: 8.8 mg/dL — ABNORMAL LOW (ref 8.9–10.3)
Chloride: 109 mmol/L (ref 101–111)
Creatinine, Ser: 1.99 mg/dL — ABNORMAL HIGH (ref 0.61–1.24)
GFR calc Af Amer: 38 mL/min — ABNORMAL LOW (ref >60)
GFR calc non Af Amer: 33 mL/min — ABNORMAL LOW (ref >60)
Glucose, Bld: 85 mg/dL (ref 65–99)
Potassium: 4.2 mmol/L (ref 3.5–5.1)
Sodium: 141 mmol/L (ref 135–145)

## 2016-06-05 LAB — I-STAT TROPONIN, ED: Troponin i, poc: 0 ng/mL (ref 0.00–0.08)

## 2016-06-05 LAB — BRAIN NATRIURETIC PEPTIDE: B Natriuretic Peptide: 38.2 pg/mL (ref 0.0–100.0)

## 2016-06-05 LAB — CBG MONITORING, ED: Glucose-Capillary: 78 mg/dL (ref 65–99)

## 2016-06-05 MED ORDER — ONDANSETRON HCL 4 MG PO TABS: 4.0000 mg | ORAL_TABLET | Freq: Four times a day (QID) | ORAL | Status: DC | PRN

## 2016-06-05 MED ORDER — ROSUVASTATIN CALCIUM 5 MG PO TABS
5.0000 mg | ORAL_TABLET | Freq: Every day | ORAL | Status: DC
  Administered 2016-06-05: 5 mg via ORAL
  Filled 2016-06-05 (×2): qty 1

## 2016-06-05 MED ORDER — METOPROLOL TARTRATE 50 MG PO TABS
50.0000 mg | ORAL_TABLET | Freq: Two times a day (BID) | ORAL | Status: DC
  Administered 2016-06-05 – 2016-06-06 (×2): 50 mg via ORAL
  Filled 2016-06-05 (×2): qty 1

## 2016-06-05 MED ORDER — SODIUM CHLORIDE 0.9% FLUSH
3.0000 mL | Freq: Two times a day (BID) | INTRAVENOUS | Status: DC
  Administered 2016-06-05 – 2016-06-06 (×2): 3 mL via INTRAVENOUS

## 2016-06-05 MED ORDER — INSULIN ASPART 100 UNIT/ML ~~LOC~~ SOLN
0.0000 [IU] | Freq: Three times a day (TID) | SUBCUTANEOUS | Status: DC
  Administered 2016-06-06: 3 [IU] via SUBCUTANEOUS

## 2016-06-05 MED ORDER — ASPIRIN EC 81 MG PO TBEC
81.0000 mg | DELAYED_RELEASE_TABLET | Freq: Every day | ORAL | Status: DC
  Administered 2016-06-06: 81 mg via ORAL
  Filled 2016-06-05: qty 1

## 2016-06-05 MED ORDER — AMLODIPINE BESYLATE 2.5 MG PO TABS
2.5000 mg | ORAL_TABLET | Freq: Every day | ORAL | Status: DC
Start: 2016-06-06 — End: 2016-06-06
  Administered 2016-06-06: 2.5 mg via ORAL
  Filled 2016-06-05: qty 1

## 2016-06-05 MED ORDER — GABAPENTIN 300 MG PO CAPS
300.0000 mg | ORAL_CAPSULE | Freq: Two times a day (BID) | ORAL | Status: DC
  Administered 2016-06-05 – 2016-06-06 (×2): 300 mg via ORAL
  Filled 2016-06-05 (×2): qty 1

## 2016-06-05 MED ORDER — ZOLPIDEM TARTRATE 5 MG PO TABS: 5.0000 mg | ORAL_TABLET | Freq: Every evening | ORAL | Status: DC | PRN

## 2016-06-05 MED ORDER — INSULIN GLARGINE 100 UNIT/ML ~~LOC~~ SOLN
10.0000 [IU] | Freq: Two times a day (BID) | SUBCUTANEOUS | Status: DC
Start: 2016-06-06 — End: 2016-06-06
  Administered 2016-06-05 – 2016-06-06 (×2): 10 [IU] via SUBCUTANEOUS
  Filled 2016-06-05 (×3): qty 0.1

## 2016-06-05 MED ORDER — NICOTINE 21 MG/24HR TD PT24
21.0000 mg | MEDICATED_PATCH | Freq: Every day | TRANSDERMAL | Status: DC
  Administered 2016-06-05 – 2016-06-06 (×2): 21 mg via TRANSDERMAL
  Filled 2016-06-05 (×2): qty 1

## 2016-06-05 MED ORDER — ONDANSETRON HCL 4 MG/2ML IJ SOLN: 4.0000 mg | Freq: Four times a day (QID) | INTRAMUSCULAR | Status: DC | PRN

## 2016-06-05 MED ORDER — CLOPIDOGREL BISULFATE 75 MG PO TABS
75.0000 mg | ORAL_TABLET | Freq: Every day | ORAL | Status: DC
  Administered 2016-06-06: 75 mg via ORAL
  Filled 2016-06-05: qty 1

## 2016-06-05 MED ORDER — ACETAMINOPHEN 650 MG RE SUPP: 650.0000 mg | Freq: Four times a day (QID) | RECTAL | Status: DC | PRN

## 2016-06-05 MED ORDER — ENOXAPARIN SODIUM 40 MG/0.4ML ~~LOC~~ SOLN
40.0000 mg | SUBCUTANEOUS | Status: DC
  Administered 2016-06-05: 40 mg via SUBCUTANEOUS
  Filled 2016-06-05: qty 0.4

## 2016-06-05 MED ORDER — ACETAMINOPHEN 325 MG PO TABS: 650.0000 mg | ORAL_TABLET | Freq: Four times a day (QID) | ORAL | Status: DC | PRN

## 2016-06-05 NOTE — ED Triage Notes (Signed)
Patient was at lunch when he started to pass out. He was lowered to the ground. Upon EMS arrival he was responsive to painful stimuli and was A/Ox3 when transferred to ambulance. A/Ox3 currently. This has happened before he states.
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
## 2016-06-05 NOTE — ED Notes (Signed)
Helped patient to the bedside commode because he refused the bedpan. Patient was not very steady on his feet. He didn't fall but was unsteady.

## 2016-06-05 NOTE — ED Notes (Signed)
Patient transported to X-ray 

## 2016-06-05 NOTE — ED Notes (Signed)
Patient transported to CT 
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
## 2016-06-05 NOTE — ED Notes (Signed)
Attempted report x1. 
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
## 2016-06-05 NOTE — ED Notes (Signed)
Dr. Floyd at bedside. 

## 2016-06-06 ENCOUNTER — Observation Stay (HOSPITAL_COMMUNITY): Payer: Medicare Other

## 2016-06-06 DIAGNOSIS — R6889 Other general symptoms and signs: Secondary | ICD-10-CM | POA: Diagnosis not present

## 2016-06-06 DIAGNOSIS — Z794 Long term (current) use of insulin: Secondary | ICD-10-CM

## 2016-06-06 DIAGNOSIS — I10 Essential (primary) hypertension: Secondary | ICD-10-CM | POA: Diagnosis not present

## 2016-06-06 DIAGNOSIS — N183 Chronic kidney disease, stage 3 (moderate): Secondary | ICD-10-CM | POA: Diagnosis not present

## 2016-06-06 DIAGNOSIS — N179 Acute kidney failure, unspecified: Secondary | ICD-10-CM | POA: Diagnosis not present

## 2016-06-06 DIAGNOSIS — G9340 Encephalopathy, unspecified: Secondary | ICD-10-CM | POA: Diagnosis not present

## 2016-06-06 DIAGNOSIS — G3183 Dementia with Lewy bodies: Secondary | ICD-10-CM | POA: Diagnosis not present

## 2016-06-06 DIAGNOSIS — F17200 Nicotine dependence, unspecified, uncomplicated: Secondary | ICD-10-CM | POA: Diagnosis not present

## 2016-06-06 DIAGNOSIS — F0280 Dementia in other diseases classified elsewhere without behavioral disturbance: Secondary | ICD-10-CM | POA: Diagnosis not present

## 2016-06-06 DIAGNOSIS — E1122 Type 2 diabetes mellitus with diabetic chronic kidney disease: Secondary | ICD-10-CM

## 2016-06-06 LAB — RAPID URINE DRUG SCREEN, HOSP PERFORMED
Amphetamines: NOT DETECTED
Barbiturates: NOT DETECTED
Benzodiazepines: NOT DETECTED
Cocaine: NOT DETECTED
Opiates: NOT DETECTED
Tetrahydrocannabinol: NOT DETECTED

## 2016-06-06 LAB — CBC
HCT: 32.9 % — ABNORMAL LOW (ref 39.0–52.0)
Hemoglobin: 10.9 g/dL — ABNORMAL LOW (ref 13.0–17.0)
MCH: 30.7 pg (ref 26.0–34.0)
MCHC: 33.1 g/dL (ref 30.0–36.0)
MCV: 92.7 fL (ref 78.0–100.0)
Platelets: 238 10*3/uL (ref 150–400)
RBC: 3.55 MIL/uL — ABNORMAL LOW (ref 4.22–5.81)
RDW: 13.8 % (ref 11.5–15.5)
WBC: 5.1 10*3/uL (ref 4.0–10.5)

## 2016-06-06 LAB — GLUCOSE, CAPILLARY
Glucose-Capillary: 246 mg/dL — ABNORMAL HIGH (ref 65–99)
Glucose-Capillary: 94 mg/dL (ref 65–99)

## 2016-06-06 LAB — BASIC METABOLIC PANEL
Anion gap: 9 (ref 5–15)
BUN: 27 mg/dL — ABNORMAL HIGH (ref 6–20)
CO2: 26 mmol/L (ref 22–32)
Calcium: 8.6 mg/dL — ABNORMAL LOW (ref 8.9–10.3)
Chloride: 107 mmol/L (ref 101–111)
Creatinine, Ser: 1.57 mg/dL — ABNORMAL HIGH (ref 0.61–1.24)
GFR calc Af Amer: 50 mL/min — ABNORMAL LOW (ref >60)
GFR calc non Af Amer: 43 mL/min — ABNORMAL LOW (ref >60)
Glucose, Bld: 154 mg/dL — ABNORMAL HIGH (ref 65–99)
Potassium: 3.6 mmol/L (ref 3.5–5.1)
Sodium: 142 mmol/L (ref 135–145)

## 2016-06-06 LAB — URINALYSIS, ROUTINE W REFLEX MICROSCOPIC
Bilirubin Urine: NEGATIVE
Glucose, UA: 150 mg/dL — AB
Hgb urine dipstick: NEGATIVE
Ketones, ur: NEGATIVE mg/dL
Leukocytes, UA: NEGATIVE
Nitrite: NEGATIVE
Protein, ur: NEGATIVE mg/dL
Specific Gravity, Urine: 1.025 (ref 1.005–1.030)
pH: 5 (ref 5.0–8.0)

## 2016-06-06 MED ORDER — DONEPEZIL HCL 5 MG PO TABS: 5.0000 mg | ORAL_TABLET | Freq: Every day | ORAL | Status: DC

## 2016-06-06 MED ORDER — SODIUM CHLORIDE 0.9 % IV SOLN
INTRAVENOUS | Status: DC
  Administered 2016-06-06: 12:00:00 via INTRAVENOUS

## 2016-06-06 MED ORDER — DONEPEZIL HCL 5 MG PO TABS: 5.0000 mg | ORAL_TABLET | Freq: Every day | ORAL | 0 refills | Status: DC

## 2016-06-06 MED ORDER — NICOTINE 21 MG/24HR TD PT24: 21.0000 mg | MEDICATED_PATCH | Freq: Every day | TRANSDERMAL | 0 refills | Status: DC

## 2016-06-06 NOTE — Progress Notes (Signed)
Discharge instructions, RX's and follow up appts explained and provided to patient and family verbalized understanding. Patient left floor via wheelchair accompanied by staff no c/o pain or shortness of breath at d/c.  Amberli Ruegg Lynn, RN  
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
## 2016-06-06 NOTE — Progress Notes (Signed)
Chart reviewed. No NCM needs identified. Jonnie Finner RN CCM Case Mgmt phone [phone]

## 2016-06-06 NOTE — Progress Notes (Signed)
EEG completed; results pending.    

## 2016-06-06 NOTE — Procedures (Signed)
ELECTROENCEPHALOGRAM REPORT  Date of Study: 06/06/2016  Patient's Name: Harry [name] MRN: [id number] Date of Birth: June 30, 1945  Referring Provider: Kerney Elbe, MD  Clinical History: 71 year old male with episode of altered mental status  Medications: acetaminophen (TYLENOL) 650 mg  amLODipine (NORVASC) tablet 2.5 mg  aspirin EC tablet 81 mg  clopidogrel (PLAVIX) tablet 75 mg  donepezil (ARICEPT) tablet 5 mg  enoxaparin (LOVENOX) injection 40 mg  gabapentin (NEURONTIN) capsule 300 mg  insulin aspart (novoLOG) injection 0-9 Units  insulin glargine (LANTUS) injection 10 Units  metoprolol tartrate (LOPRESSOR) tablet 50 mg  nicotine (NICODERM ) patch 21 mg  ondansetron (ZOFRAN)   rosuvastatin (CRESTOR) tablet 5 mg  zolpidem (AMBIEN) tablet 5 mg  Technical Summary: A multichannel digital EEG recording measured by the international 10-20 system with electrodes applied with paste and impedances below 5000 ohms performed in our laboratory with EKG monitoring in an awake and asleep patient.  Hyperventilation was not performed due to cerebrovascular disease.  Photic stimulation was performed.  The digital EEG was referentially recorded, reformatted, and digitally filtered in a variety of bipolar and referential montages for optimal display.    Description: The patient is awake and asleep during the recording.  During maximal wakefulness, there is a symmetric, medium voltage 8 Hz posterior dominant rhythm that attenuates with eye opening.  The record is symmetric.  During drowsiness and sleep, there is an increase in theta slowing of the background.  Vertex waves and symmetric sleep spindles were seen.  Photic stimulation did not elicit any abnormalities.  There were no epileptiform discharges or electrographic seizures seen.    EKG lead was unremarkable.  Impression: This awake and asleep EEG is normal.    Clinical Correlation: A normal EEG does not exclude a clinical diagnosis of  epilepsy.  If further clinical questions remain, prolonged EEG may be helpful.  Clinical correlation is advised.   Metta Clines, DO

## 2016-06-06 NOTE — Progress Notes (Signed)
Neurology Progress Note  Subjective: No spells reported overnight or today. He reports that he is back to his usual baseline and is eager to go home. He is unable to tell me anything about the episodes that he had yesterday. A family member at the bedside reports that he had a blank stare and did not respond to them for about 10 minutes. No associated pallor or diaphoresis. His eyes remained open. No abnormal motor activity was observed. He seemed to be back to his normal baseline after the spell was over. She reports two such spells yesterday. The patient does not recall them and cannot offer any details about them. He has no particular complaints on 12-point ROS.   Medications reviewed and reconciled.   Current Meds:   Current Facility-Administered Medications:  .  0.9 %  sodium chloride infusion, , Intravenous, Continuous, Eugenie Filler, MD, Last Rate: 75 mL/hr at 06/06/16 1212, 75 mL at 06/06/16 1212 .  acetaminophen (TYLENOL) tablet 650 mg, 650 mg, Oral, Q6H PRN **OR** acetaminophen (TYLENOL) suppository 650 mg, 650 mg, Rectal, Q6H PRN, Ivor Costa, MD .  amLODipine (NORVASC) tablet 2.5 mg, 2.5 mg, Oral, Daily, Ivor Costa, MD, 2.5 mg at 06/06/16 1138 .  aspirin EC tablet 81 mg, 81 mg, Oral, Daily, Ivor Costa, MD, 81 mg at 06/06/16 1139 .  clopidogrel (PLAVIX) tablet 75 mg, 75 mg, Oral, Q breakfast, Ivor Costa, MD, 75 mg at 06/06/16 1138 .  donepezil (ARICEPT) tablet 5 mg, 5 mg, Oral, QHS, Ivor Costa, MD .  enoxaparin (LOVENOX) injection 40 mg, 40 mg, Subcutaneous, Q24H, Ivor Costa, MD, 40 mg at 06/05/16 2355 .  gabapentin (NEURONTIN) capsule 300 mg, 300 mg, Oral, BID, Ivor Costa, MD, 300 mg at 06/06/16 1139 .  insulin aspart (novoLOG) injection 0-9 Units, 0-9 Units, Subcutaneous, TID WC, Ivor Costa, MD, 3 Units at 06/06/16 1140 .  insulin glargine (LANTUS) injection 10 Units, 10 Units, Subcutaneous, BID, Ivor Costa, MD, 10 Units at 06/06/16 1139 .  metoprolol tartrate (LOPRESSOR) tablet 50 mg, 50  mg, Oral, BID, Ivor Costa, MD, 50 mg at 06/06/16 1139 .  nicotine (NICODERM CQ - dosed in mg/24 hours) patch 21 mg, 21 mg, Transdermal, Daily, Ivor Costa, MD, 21 mg at 06/06/16 1142 .  ondansetron (ZOFRAN) tablet 4 mg, 4 mg, Oral, Q6H PRN **OR** ondansetron (ZOFRAN) injection 4 mg, 4 mg, Intravenous, Q6H PRN, Ivor Costa, MD .  rosuvastatin (CRESTOR) tablet 5 mg, 5 mg, Oral, QHS, Ivor Costa, MD, 5 mg at 06/05/16 2354 .  sodium chloride flush (NS) 0.9 % injection 3 mL, 3 mL, Intravenous, Q12H, Ivor Costa, MD, 3 mL at 06/06/16 1140 .  zolpidem (AMBIEN) tablet 5 mg, 5 mg, Oral, QHS PRN, Ivor Costa, MD  Objective:  Temp:  [97.5 F (36.4 C)-98.6 F (37 C)] 98.6 F (37 C) (04/04 1023) Pulse Rate:  [71-80] 80 (04/04 1023) Resp:  [11-20] 20 (04/04 1023) BP: (122-181)/(66-87) 170/79 (04/04 1023) SpO2:  [98 %-100 %] 99 % (04/04 1023) Weight:  [86.2 kg (190 lb)-90.5 kg (199 lb 8.3 oz)] 90.5 kg (199 lb 8.3 oz) (04/03 2348)  General: WDWN AA man sitting up in chair in NAD. Alert, oriented to everything but the date. Speech is clear without dysarthria. Affect is bright. Comportment is normal.  HEENT: Neck is supple without lymphadenopathy. Mucous membranes are moist and the oropharynx is clear. Sclerae are anicteric. There is no conjunctival injection.  CV: Regular, no murmur. Carotid pulses are 2+ and symmetric with no bruits.  Distal pulses 2+ and symmetric.  Lungs: CTAB  Extremities: No C/C/E. Neuro: MS: As noted above.  CN: The L pupil is difficult to assess due to corneal clouding but it appears nonreactive. The R pupil reacts from 3-->2 mm. Vision is poor. He sees some shapes and movement in the R eye, nothing in the L. Mild L ptosis is present. Volitional saccades are intact, no nystagmus. Facial sensation is intact to light touch. Face is symmetric at rest with normal strength and mobility. Hearing is intact to conversational voice. Voice is normal in tone and quality. Palate elevates symmetrically. Uvula  is midline. Bilateral SCM and trapezii are 5/5. Tongue is midline with normal bulk and mobility.  Motor: Normal bulk, tone, and strength throughout. No pronator drift. No tremor or other abnormal movements are observed.  Sensation: Intact to light touch. DTRs: 2+, symmetric. Toes are downgoing bilaterally. No pathological reflexes.     Labs: Lab Results  Component Value Date   WBC 5.1 06/06/2016   HGB 10.9 (L) 06/06/2016   HCT 32.9 (L) 06/06/2016   PLT 238 06/06/2016   GLUCOSE 154 (H) 06/06/2016   CHOL 122 05/26/2016   TRIG 77 05/26/2016   HDL 31 (L) 05/26/2016   LDLCALC 76 05/26/2016   ALT 18 05/25/2016   AST 17 05/25/2016   NA 142 06/06/2016   K 3.6 06/06/2016   CL 107 06/06/2016   CREATININE 1.57 (H) 06/06/2016   BUN 27 (H) 06/06/2016   CO2 26 06/06/2016   TSH 1.309 05/26/2016   INR 1.10 10/17/2014   HGBA1C 7.3 (H) 05/26/2016   CBC Latest Ref Rng & Units 06/06/2016 06/05/2016 05/25/2016  WBC 4.0 - 10.5 K/uL 5.1 4.9 5.4  Hemoglobin 13.0 - 17.0 g/dL 10.9(L) 11.0(L) 11.3(L)  Hematocrit 39.0 - 52.0 % 32.9(L) 33.5(L) 34.2(L)  Platelets 150 - 400 K/uL 238 228 235    Lab Results  Component Value Date   HGBA1C 7.3 (H) 05/26/2016   Lab Results  Component Value Date   ALT 18 05/25/2016   AST 17 05/25/2016   ALKPHOS 66 05/25/2016   BILITOT 1.1 05/25/2016    Radiology:  I have personally and independently reviewed the [facility] without contrast from 06/05/16. This shows no obvious acute abnormality. There is a focal area of encephalomalacia involving the inferior aspect of the left cerebellar hemisphere consistent with an old infarct. Additional areas of remote ischemia are seen in bilateral occipital lobes (L>R) and the right cerebellar hemisphere. There is a moderate burden of chronic small vessel ischemic disease.   Other diagnostic studies:  EEG today is normal.   A/P:   1. Spells of decreased awareness: He had two episodes consisting of a blank stare with unresponsiveness to  family yesterday. No abnormal motor activity. These lasted about 10 minutes and then resolved, no clear postictal state per witness. These are non-specific in nature. Complex partial seizure is possible though the spells are longer than expected without definite postictal phase. EEG was normal. Syncope and arrhythmia must be considered. He has had confusion in the setting of hypertensive emergency and poorly controlled blood pressure in the past--unclear what BP was at home but here in hospital SBP has ranged 122-181. These spells could also be representative of an underlying neurodegenerative process such as vascular cognitive impairment/dementia. Family reported to Dr [name] that he has been having visual hallucinations, he has developed a shuffling gait, and he has fluctuations in his cognition, raising concern for possible dementia with Lewy bodies.  At this time, he seems to be at his baseline. Given the ambiguous nature of his spells with normal EEG, I am not inclined to initiate antiepileptic drug therapy at this time. I would recommend outpatient neurology follow up for further evaluation.   Needs outpatient neurology follow-up?: Yes, 1-2 weeks  This was discussed with the patient and family. Education was provided on the diagnosis and expected evaluation and treatment. They are in agreement with the plan as noted. They were given the opportunity to ask any questions and these were addressed to their satisfaction.   I discussed my impression and recommendations with Dr. Grandville Silos at the time of my visit.   Melba Coon, MD [facility]

## 2016-06-06 NOTE — Progress Notes (Signed)
Physical Therapy Evaluation Patient Details Name: Harry [name] MRN: [id number] DOB: 03-08-1945 Today's Date: 06/06/2016   History of Present Illness  71 y.o. male admitted to [facility] 06/05/16 for AMS and questionable seizure activity.  EEG was negative, MRI revealed chronic scattered micro hemorrhages.  Pt with significant PMHx of L eye vision loss, DM2, stroke, PVD, peripheral neuropathy, HTN, and CKD III.  Clinical Impression  Pt seems to be close to his mobility baseline walking with RW with assist at times when his vision is worse.  He seems to have intact strength and coordination that is equal bil.  PT will follow acutely, but does not have any recommendations for f/u at discharge.      Follow Up Recommendations No PT follow up;Supervision for mobility/OOB    Equipment Recommendations  None recommended by PT    Recommendations for Other Services   NA    Precautions / Restrictions Precautions Precautions: Fall      Mobility  Bed Mobility               General bed mobility comments: Pt is OOB in the chair.   Transfers Overall transfer level: Needs assistance Equipment used: Rolling walker (2 wheeled) Transfers: Sit to/from Stand Sit to Stand: Min guard         General transfer comment: Min guard assist for safety  Ambulation/Gait Ambulation/Gait assistance: Min guard Ambulation Distance (Feet): 200 Feet Assistive device: Rolling walker (2 wheeled) Gait Pattern/deviations: Step-through pattern;Shuffle;Trunk flexed     General Gait Details: Pt with shuffling gait pattern at first, but after he got moving he was able to walk with a good strinde length.  Min guard assist for safety and cues at times to right RW to the middle of the hallway, stay closer to the RW and for upright posture.  Visual impairment was evident with obstacle negotiation in the hallway.          Balance Overall balance assessment: Needs assistance Sitting-balance support: Feet  supported;No upper extremity supported Sitting balance-Leahy Scale: Good     Standing balance support: Bilateral upper extremity supported Standing balance-Leahy Scale: Poor Standing balance comment: needs support of RW                             Pertinent Vitals/Pain Pain Assessment: No/denies pain    Home Living Family/patient expects to be discharged to:: Private residence Living Arrangements: Spouse/significant other;Children Available Help at Discharge: Family;Available 24 hours/day;Other (Comment) (for the most part) Type of Home: Apartment Home Access: Level entry;Elevator     Home Layout: One level Home Equipment: Walker - 2 wheels;Cane - single point;Bedside commode;Tub bench      Prior Function Level of Independence: Needs assistance   Gait / Transfers Assistance Needed: uses a RW at home, when his vision is episodically worse, he needs more assistance  ADL's / Homemaking Assistance Needed: gets help from his son        Hand Dominance   Dominant Hand: Right    Extremity/Trunk Assessment   Upper Extremity Assessment Upper Extremity Assessment: Overall WFL for tasks assessed (equal grip, strong push/pull, coordination intact)    Lower Extremity Assessment Lower Extremity Assessment: RLE deficits/detail;LLE deficits/detail RLE Deficits / Details: strength equal bil, weak 3+/5 ankle, knees, hips.  RLE Sensation: history of peripheral neuropathy LLE Deficits / Details: strength equal bil, weak 3+/5 ankle, knees, hips.  LLE Sensation: history of peripheral neuropathy    Cervical /  Trunk Assessment Cervical / Trunk Assessment: Normal  Communication   Communication: No difficulties  Cognition Arousal/Alertness: Awake/alert Behavior During Therapy: WFL for tasks assessed/performed Overall Cognitive Status: History of cognitive impairments - at baseline                                               Assessment/Plan    PT  Assessment Patient needs continued PT services  PT Problem List Decreased strength;Decreased activity tolerance;Decreased balance;Decreased mobility;Decreased knowledge of use of DME       PT Treatment Interventions DME instruction;Gait training;Functional mobility training;Therapeutic activities;Therapeutic exercise;Balance training;Patient/family education    PT Goals (Current goals can be found in the Care Plan section)  Acute Rehab PT Goals Patient Stated Goal: return home PT Goal Formulation: With patient Time For Goal Achievement: 06/20/16 Potential to Achieve Goals: Good    Frequency Min 3X/week    End of Session Equipment Utilized During Treatment: Gait belt Activity Tolerance: Patient tolerated treatment well Patient left: in chair;with call bell/phone within reach;with chair alarm set   PT Visit Diagnosis: Unsteadiness on feet (R26.81);Difficulty in walking, not elsewhere classified (R26.2);Muscle weakness (generalized) (M62.81)    Time: 7616-0737 PT Time Calculation (min) (ACUTE ONLY): 18 min   Charges:   PT Evaluation $PT Eval Moderate Complexity: 1 Procedure     PT G Codes:   PT G-Codes **NOT FOR INPATIENT CLASS** Functional Assessment Tool Used: AM-PAC 6 Clicks Basic Mobility Functional Limitation: Mobility: Walking and moving around Mobility: Walking and Moving Around Current Status (T0626): At least 40 percent but less than 60 percent impaired, limited or restricted Mobility: Walking and Moving Around Goal Status [phone]): At least 20 percent but less than 40 percent impaired, limited or restricted      Hanley Rispoli B. [place], [place], DPT [phone]   06/06/2016, 5:05 PM

## 2016-06-07 DIAGNOSIS — N183 Chronic kidney disease, stage 3 (moderate): Secondary | ICD-10-CM | POA: Diagnosis not present

## 2016-06-07 DIAGNOSIS — G9340 Encephalopathy, unspecified: Secondary | ICD-10-CM | POA: Diagnosis not present

## 2016-06-07 DIAGNOSIS — E1142 Type 2 diabetes mellitus with diabetic polyneuropathy: Secondary | ICD-10-CM | POA: Diagnosis not present

## 2016-06-07 DIAGNOSIS — I161 Hypertensive emergency: Secondary | ICD-10-CM | POA: Diagnosis not present

## 2016-06-07 DIAGNOSIS — E1122 Type 2 diabetes mellitus with diabetic chronic kidney disease: Secondary | ICD-10-CM | POA: Diagnosis not present

## 2016-06-07 DIAGNOSIS — I129 Hypertensive chronic kidney disease with stage 1 through stage 4 chronic kidney disease, or unspecified chronic kidney disease: Secondary | ICD-10-CM | POA: Diagnosis not present

## 2016-06-08 DIAGNOSIS — E1142 Type 2 diabetes mellitus with diabetic polyneuropathy: Secondary | ICD-10-CM | POA: Diagnosis not present

## 2016-06-08 DIAGNOSIS — N183 Chronic kidney disease, stage 3 (moderate): Secondary | ICD-10-CM | POA: Diagnosis not present

## 2016-06-08 DIAGNOSIS — E1122 Type 2 diabetes mellitus with diabetic chronic kidney disease: Secondary | ICD-10-CM | POA: Diagnosis not present

## 2016-06-08 DIAGNOSIS — G9340 Encephalopathy, unspecified: Secondary | ICD-10-CM | POA: Diagnosis not present

## 2016-06-08 DIAGNOSIS — I129 Hypertensive chronic kidney disease with stage 1 through stage 4 chronic kidney disease, or unspecified chronic kidney disease: Secondary | ICD-10-CM | POA: Diagnosis not present

## 2016-06-08 DIAGNOSIS — I161 Hypertensive emergency: Secondary | ICD-10-CM | POA: Diagnosis not present

## 2016-06-11 ENCOUNTER — Ambulatory Visit: Payer: Medicare Other | Admitting: Nurse Practitioner

## 2016-06-12 DIAGNOSIS — R6889 Other general symptoms and signs: Secondary | ICD-10-CM

## 2016-06-12 DIAGNOSIS — N183 Chronic kidney disease, stage 3 (moderate): Secondary | ICD-10-CM | POA: Diagnosis not present

## 2016-06-12 DIAGNOSIS — E1122 Type 2 diabetes mellitus with diabetic chronic kidney disease: Secondary | ICD-10-CM | POA: Diagnosis not present

## 2016-06-12 DIAGNOSIS — I161 Hypertensive emergency: Secondary | ICD-10-CM | POA: Diagnosis not present

## 2016-06-12 DIAGNOSIS — I129 Hypertensive chronic kidney disease with stage 1 through stage 4 chronic kidney disease, or unspecified chronic kidney disease: Secondary | ICD-10-CM | POA: Diagnosis not present

## 2016-06-12 DIAGNOSIS — E1142 Type 2 diabetes mellitus with diabetic polyneuropathy: Secondary | ICD-10-CM | POA: Diagnosis not present

## 2016-06-12 DIAGNOSIS — G9340 Encephalopathy, unspecified: Secondary | ICD-10-CM | POA: Diagnosis not present

## 2016-06-15 DIAGNOSIS — E1142 Type 2 diabetes mellitus with diabetic polyneuropathy: Secondary | ICD-10-CM | POA: Diagnosis not present

## 2016-06-15 DIAGNOSIS — I129 Hypertensive chronic kidney disease with stage 1 through stage 4 chronic kidney disease, or unspecified chronic kidney disease: Secondary | ICD-10-CM | POA: Diagnosis not present

## 2016-06-15 DIAGNOSIS — N183 Chronic kidney disease, stage 3 (moderate): Secondary | ICD-10-CM | POA: Diagnosis not present

## 2016-06-15 DIAGNOSIS — I161 Hypertensive emergency: Secondary | ICD-10-CM | POA: Diagnosis not present

## 2016-06-15 DIAGNOSIS — G9340 Encephalopathy, unspecified: Secondary | ICD-10-CM | POA: Diagnosis not present

## 2016-06-15 DIAGNOSIS — E1122 Type 2 diabetes mellitus with diabetic chronic kidney disease: Secondary | ICD-10-CM | POA: Diagnosis not present

## 2016-06-18 DIAGNOSIS — I129 Hypertensive chronic kidney disease with stage 1 through stage 4 chronic kidney disease, or unspecified chronic kidney disease: Secondary | ICD-10-CM | POA: Diagnosis not present

## 2016-06-18 DIAGNOSIS — G9340 Encephalopathy, unspecified: Secondary | ICD-10-CM | POA: Diagnosis not present

## 2016-06-18 DIAGNOSIS — E1122 Type 2 diabetes mellitus with diabetic chronic kidney disease: Secondary | ICD-10-CM | POA: Diagnosis not present

## 2016-06-18 DIAGNOSIS — I161 Hypertensive emergency: Secondary | ICD-10-CM | POA: Diagnosis not present

## 2016-06-18 DIAGNOSIS — N183 Chronic kidney disease, stage 3 (moderate): Secondary | ICD-10-CM | POA: Diagnosis not present

## 2016-06-18 DIAGNOSIS — E1142 Type 2 diabetes mellitus with diabetic polyneuropathy: Secondary | ICD-10-CM | POA: Diagnosis not present

## 2016-06-19 ENCOUNTER — Ambulatory Visit: Payer: Self-pay | Admitting: Diagnostic Neuroimaging

## 2016-06-19 DIAGNOSIS — H3341 Traction detachment of retina, right eye: Secondary | ICD-10-CM | POA: Diagnosis not present

## 2016-06-19 DIAGNOSIS — H4311 Vitreous hemorrhage, right eye: Secondary | ICD-10-CM | POA: Diagnosis not present

## 2016-06-19 DIAGNOSIS — E113591 Type 2 diabetes mellitus with proliferative diabetic retinopathy without macular edema, right eye: Secondary | ICD-10-CM | POA: Diagnosis not present

## 2016-06-20 DIAGNOSIS — I161 Hypertensive emergency: Secondary | ICD-10-CM | POA: Diagnosis not present

## 2016-06-21 DIAGNOSIS — E1122 Type 2 diabetes mellitus with diabetic chronic kidney disease: Secondary | ICD-10-CM | POA: Diagnosis not present

## 2016-06-21 DIAGNOSIS — I161 Hypertensive emergency: Secondary | ICD-10-CM | POA: Diagnosis not present

## 2016-06-21 DIAGNOSIS — I129 Hypertensive chronic kidney disease with stage 1 through stage 4 chronic kidney disease, or unspecified chronic kidney disease: Secondary | ICD-10-CM | POA: Diagnosis not present

## 2016-06-21 DIAGNOSIS — N183 Chronic kidney disease, stage 3 (moderate): Secondary | ICD-10-CM | POA: Diagnosis not present

## 2016-06-21 DIAGNOSIS — E1142 Type 2 diabetes mellitus with diabetic polyneuropathy: Secondary | ICD-10-CM | POA: Diagnosis not present

## 2016-06-21 DIAGNOSIS — G9340 Encephalopathy, unspecified: Secondary | ICD-10-CM | POA: Diagnosis not present

## 2016-06-25 ENCOUNTER — Telehealth: Payer: Self-pay | Admitting: *Deleted

## 2016-06-25 NOTE — Telephone Encounter (Signed)
Spoke to pts wife and made appt for 07-04-16 at 1130.  She confirmed and will tell her husband.

## 2016-06-26 DIAGNOSIS — N183 Chronic kidney disease, stage 3 (moderate): Secondary | ICD-10-CM | POA: Diagnosis not present

## 2016-06-26 DIAGNOSIS — G9340 Encephalopathy, unspecified: Secondary | ICD-10-CM | POA: Diagnosis not present

## 2016-06-26 DIAGNOSIS — I161 Hypertensive emergency: Secondary | ICD-10-CM | POA: Diagnosis not present

## 2016-06-26 DIAGNOSIS — E1142 Type 2 diabetes mellitus with diabetic polyneuropathy: Secondary | ICD-10-CM | POA: Diagnosis not present

## 2016-06-26 DIAGNOSIS — I129 Hypertensive chronic kidney disease with stage 1 through stage 4 chronic kidney disease, or unspecified chronic kidney disease: Secondary | ICD-10-CM | POA: Diagnosis not present

## 2016-06-26 DIAGNOSIS — E1122 Type 2 diabetes mellitus with diabetic chronic kidney disease: Secondary | ICD-10-CM | POA: Diagnosis not present

## 2016-06-28 ENCOUNTER — Emergency Department (HOSPITAL_COMMUNITY)
Admission: EM | Admit: 2016-06-28 | Discharge: 2016-06-28 | Disposition: A | Discharge status: Home / Self Care | Payer: Medicare Other | Source: Home / Self Care | Attending: Emergency Medicine | Admitting: Emergency Medicine

## 2016-06-28 ENCOUNTER — Encounter (HOSPITAL_COMMUNITY): Payer: Self-pay

## 2016-06-28 DIAGNOSIS — Z7982 Long term (current) use of aspirin: Secondary | ICD-10-CM | POA: Insufficient documentation

## 2016-06-28 DIAGNOSIS — N390 Urinary tract infection, site not specified: Secondary | ICD-10-CM | POA: Diagnosis not present

## 2016-06-28 DIAGNOSIS — E860 Dehydration: Secondary | ICD-10-CM

## 2016-06-28 DIAGNOSIS — F17210 Nicotine dependence, cigarettes, uncomplicated: Secondary | ICD-10-CM | POA: Insufficient documentation

## 2016-06-28 DIAGNOSIS — N183 Chronic kidney disease, stage 3 (moderate): Secondary | ICD-10-CM

## 2016-06-28 DIAGNOSIS — A419 Sepsis, unspecified organism: Secondary | ICD-10-CM | POA: Diagnosis not present

## 2016-06-28 DIAGNOSIS — Z8673 Personal history of transient ischemic attack (TIA), and cerebral infarction without residual deficits: Secondary | ICD-10-CM | POA: Insufficient documentation

## 2016-06-28 DIAGNOSIS — R17 Unspecified jaundice: Secondary | ICD-10-CM | POA: Diagnosis not present

## 2016-06-28 DIAGNOSIS — E1140 Type 2 diabetes mellitus with diabetic neuropathy, unspecified: Secondary | ICD-10-CM | POA: Insufficient documentation

## 2016-06-28 DIAGNOSIS — J9601 Acute respiratory failure with hypoxia: Secondary | ICD-10-CM | POA: Diagnosis not present

## 2016-06-28 DIAGNOSIS — N179 Acute kidney failure, unspecified: Secondary | ICD-10-CM | POA: Diagnosis not present

## 2016-06-28 DIAGNOSIS — R42 Dizziness and giddiness: Secondary | ICD-10-CM | POA: Diagnosis not present

## 2016-06-28 DIAGNOSIS — R531 Weakness: Secondary | ICD-10-CM

## 2016-06-28 DIAGNOSIS — I129 Hypertensive chronic kidney disease with stage 1 through stage 4 chronic kidney disease, or unspecified chronic kidney disease: Secondary | ICD-10-CM

## 2016-06-28 DIAGNOSIS — E1122 Type 2 diabetes mellitus with diabetic chronic kidney disease: Secondary | ICD-10-CM | POA: Insufficient documentation

## 2016-06-28 DIAGNOSIS — Z794 Long term (current) use of insulin: Secondary | ICD-10-CM

## 2016-06-28 DIAGNOSIS — R319 Hematuria, unspecified: Secondary | ICD-10-CM | POA: Diagnosis not present

## 2016-06-28 DIAGNOSIS — R310 Gross hematuria: Secondary | ICD-10-CM | POA: Diagnosis not present

## 2016-06-28 DIAGNOSIS — R404 Transient alteration of awareness: Secondary | ICD-10-CM | POA: Diagnosis not present

## 2016-06-28 DIAGNOSIS — N10 Acute tubulo-interstitial nephritis: Secondary | ICD-10-CM | POA: Diagnosis not present

## 2016-06-28 LAB — CBC
HCT: 33.9 % — ABNORMAL LOW (ref 39.0–52.0)
Hemoglobin: 11.2 g/dL — ABNORMAL LOW (ref 13.0–17.0)
MCH: 30.8 pg (ref 26.0–34.0)
MCHC: 33 g/dL (ref 30.0–36.0)
MCV: 93.1 fL (ref 78.0–100.0)
Platelets: 228 10*3/uL (ref 150–400)
RBC: 3.64 MIL/uL — ABNORMAL LOW (ref 4.22–5.81)
RDW: 13.8 % (ref 11.5–15.5)
WBC: 6.3 10*3/uL (ref 4.0–10.5)

## 2016-06-28 LAB — BASIC METABOLIC PANEL
Anion gap: 7 (ref 5–15)
BUN: 17 mg/dL (ref 6–20)
CO2: 28 mmol/L (ref 22–32)
Calcium: 9.1 mg/dL (ref 8.9–10.3)
Chloride: 108 mmol/L (ref 101–111)
Creatinine, Ser: 1.54 mg/dL — ABNORMAL HIGH (ref 0.61–1.24)
GFR calc Af Amer: 51 mL/min — ABNORMAL LOW (ref >60)
GFR calc non Af Amer: 44 mL/min — ABNORMAL LOW (ref >60)
Glucose, Bld: 84 mg/dL (ref 65–99)
Potassium: 3.9 mmol/L (ref 3.5–5.1)
Sodium: 143 mmol/L (ref 135–145)

## 2016-06-28 LAB — URINALYSIS, ROUTINE W REFLEX MICROSCOPIC
Bilirubin Urine: NEGATIVE
Glucose, UA: 50 mg/dL — AB
Ketones, ur: NEGATIVE mg/dL
Leukocytes, UA: NEGATIVE
Nitrite: NEGATIVE
Protein, ur: 30 mg/dL — AB
Specific Gravity, Urine: 1.023 (ref 1.005–1.030)
pH: 5 (ref 5.0–8.0)

## 2016-06-28 LAB — CBG MONITORING, ED: Glucose-Capillary: 127 mg/dL — ABNORMAL HIGH (ref 65–99)

## 2016-06-28 MED ORDER — AMLODIPINE BESYLATE 5 MG PO TABS
5.0000 mg | ORAL_TABLET | Freq: Once | ORAL | Status: AC
  Administered 2016-06-28: 5 mg via ORAL
  Filled 2016-06-28: qty 1

## 2016-06-28 MED ORDER — METOPROLOL TARTRATE 25 MG PO TABS
50.0000 mg | ORAL_TABLET | Freq: Once | ORAL | Status: AC
  Administered 2016-06-28: 50 mg via ORAL
  Filled 2016-06-28: qty 2

## 2016-06-28 MED ORDER — SODIUM CHLORIDE 0.9 % IV BOLUS (SEPSIS)
1000.0000 mL | Freq: Once | INTRAVENOUS | Status: AC
  Administered 2016-06-28: 1000 mL via INTRAVENOUS

## 2016-06-28 NOTE — ED Notes (Signed)
Pt attempted to urinate for approx 30 minutes, unable to void. Bladder scan completed by Juanda Crumble, NT. 12-20 ml of urine found in pt bladder.

## 2016-06-28 NOTE — ED Notes (Signed)
Harry [name], NT called for pt and unable to find.

## 2016-06-28 NOTE — ED Triage Notes (Addendum)
Pt presents to the ed for complaints of feeling lightheaded with a low blood pressure.  He was just admitted to the hospital for the same only his blood pressure was elevated.  He was given multiple prescriptions for blood pressure and today had a low blood pressure at home. On ems arrival he was positive for orthostatics.  He states he has no symptoms now.

## 2016-06-28 NOTE — ED Notes (Signed)
Pt states he still does not have to void at this time, will continue to encourage PO fluids. If pt unable to urinate, will repeat bladder scan as necessary.
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
## 2016-06-28 NOTE — ED Notes (Signed)
ED Provider at bedside to discuss family questions regarding pt creatinine and GFR.

## 2016-06-28 NOTE — ED Notes (Addendum)
Dr. Laneta Simmers aware pt having difficulty urinating. Per Dr. Laneta Simmers, pt given more PO fluids to drink and 1 L of fluid hung at approx.150 ml/hr. This nurse asked pt if he was still able to make urine, pt denied any hx of CKD despite medical history indicating Stage 3 CKD

## 2016-06-28 NOTE — ED Notes (Signed)
Patient ambulated with walker, only uses sometimes at home, had no dizziness, walked at quick pace and when he got back to room, stated he felt "a little funny".

## 2016-06-30 ENCOUNTER — Emergency Department (HOSPITAL_COMMUNITY): Payer: Medicare Other

## 2016-06-30 ENCOUNTER — Inpatient Hospital Stay (HOSPITAL_COMMUNITY)
Admission: EM | Admit: 2016-06-30 | Discharge: 2016-07-04 | DRG: 871 | Disposition: A | Discharge status: Home Health | Payer: Medicare Other | Attending: Internal Medicine | Admitting: Internal Medicine

## 2016-06-30 ENCOUNTER — Inpatient Hospital Stay (HOSPITAL_COMMUNITY): Payer: Medicare Other

## 2016-06-30 ENCOUNTER — Encounter (HOSPITAL_COMMUNITY): Payer: Self-pay | Admitting: Emergency Medicine

## 2016-06-30 DIAGNOSIS — R17 Unspecified jaundice: Secondary | ICD-10-CM | POA: Diagnosis present

## 2016-06-30 DIAGNOSIS — N1831 Chronic kidney disease, stage 3a: Secondary | ICD-10-CM | POA: Diagnosis present

## 2016-06-30 DIAGNOSIS — R05 Cough: Secondary | ICD-10-CM | POA: Diagnosis present

## 2016-06-30 DIAGNOSIS — I69398 Other sequelae of cerebral infarction: Secondary | ICD-10-CM

## 2016-06-30 DIAGNOSIS — R0989 Other specified symptoms and signs involving the circulatory and respiratory systems: Secondary | ICD-10-CM

## 2016-06-30 DIAGNOSIS — Z79899 Other long term (current) drug therapy: Secondary | ICD-10-CM | POA: Diagnosis not present

## 2016-06-30 DIAGNOSIS — N179 Acute kidney failure, unspecified: Secondary | ICD-10-CM | POA: Diagnosis present

## 2016-06-30 DIAGNOSIS — E1122 Type 2 diabetes mellitus with diabetic chronic kidney disease: Secondary | ICD-10-CM | POA: Diagnosis not present

## 2016-06-30 DIAGNOSIS — Z794 Long term (current) use of insulin: Secondary | ICD-10-CM | POA: Diagnosis not present

## 2016-06-30 DIAGNOSIS — B9620 Unspecified Escherichia coli [E. coli] as the cause of diseases classified elsewhere: Secondary | ICD-10-CM | POA: Diagnosis present

## 2016-06-30 DIAGNOSIS — Z833 Family history of diabetes mellitus: Secondary | ICD-10-CM

## 2016-06-30 DIAGNOSIS — E278 Other specified disorders of adrenal gland: Secondary | ICD-10-CM | POA: Diagnosis present

## 2016-06-30 DIAGNOSIS — E7800 Pure hypercholesterolemia, unspecified: Secondary | ICD-10-CM

## 2016-06-30 DIAGNOSIS — H5462 Unqualified visual loss, left eye, normal vision right eye: Secondary | ICD-10-CM | POA: Diagnosis present

## 2016-06-30 DIAGNOSIS — E1151 Type 2 diabetes mellitus with diabetic peripheral angiopathy without gangrene: Secondary | ICD-10-CM | POA: Diagnosis present

## 2016-06-30 DIAGNOSIS — N10 Acute tubulo-interstitial nephritis: Secondary | ICD-10-CM

## 2016-06-30 DIAGNOSIS — E785 Hyperlipidemia, unspecified: Secondary | ICD-10-CM | POA: Diagnosis present

## 2016-06-30 DIAGNOSIS — G629 Polyneuropathy, unspecified: Secondary | ICD-10-CM | POA: Diagnosis present

## 2016-06-30 DIAGNOSIS — N390 Urinary tract infection, site not specified: Secondary | ICD-10-CM

## 2016-06-30 DIAGNOSIS — F17210 Nicotine dependence, cigarettes, uncomplicated: Secondary | ICD-10-CM | POA: Diagnosis present

## 2016-06-30 DIAGNOSIS — Z7902 Long term (current) use of antithrombotics/antiplatelets: Secondary | ICD-10-CM | POA: Diagnosis not present

## 2016-06-30 DIAGNOSIS — Z8249 Family history of ischemic heart disease and other diseases of the circulatory system: Secondary | ICD-10-CM

## 2016-06-30 DIAGNOSIS — A419 Sepsis, unspecified organism: Secondary | ICD-10-CM | POA: Diagnosis present

## 2016-06-30 DIAGNOSIS — G3183 Dementia with Lewy bodies: Secondary | ICD-10-CM | POA: Diagnosis present

## 2016-06-30 DIAGNOSIS — I10 Essential (primary) hypertension: Secondary | ICD-10-CM | POA: Diagnosis not present

## 2016-06-30 DIAGNOSIS — F0280 Dementia in other diseases classified elsewhere without behavioral disturbance: Secondary | ICD-10-CM | POA: Diagnosis present

## 2016-06-30 DIAGNOSIS — R310 Gross hematuria: Secondary | ICD-10-CM | POA: Diagnosis present

## 2016-06-30 DIAGNOSIS — R319 Hematuria, unspecified: Secondary | ICD-10-CM | POA: Diagnosis not present

## 2016-06-30 DIAGNOSIS — R0602 Shortness of breath: Secondary | ICD-10-CM | POA: Diagnosis not present

## 2016-06-30 DIAGNOSIS — F0391 Unspecified dementia with behavioral disturbance: Secondary | ICD-10-CM | POA: Diagnosis not present

## 2016-06-30 DIAGNOSIS — Z888 Allergy status to other drugs, medicaments and biological substances status: Secondary | ICD-10-CM

## 2016-06-30 DIAGNOSIS — N183 Chronic kidney disease, stage 3 unspecified: Secondary | ICD-10-CM

## 2016-06-30 DIAGNOSIS — Z9841 Cataract extraction status, right eye: Secondary | ICD-10-CM

## 2016-06-30 DIAGNOSIS — I129 Hypertensive chronic kidney disease with stage 1 through stage 4 chronic kidney disease, or unspecified chronic kidney disease: Secondary | ICD-10-CM | POA: Diagnosis present

## 2016-06-30 DIAGNOSIS — E876 Hypokalemia: Secondary | ICD-10-CM | POA: Diagnosis present

## 2016-06-30 DIAGNOSIS — J90 Pleural effusion, not elsewhere classified: Secondary | ICD-10-CM | POA: Diagnosis not present

## 2016-06-30 DIAGNOSIS — D631 Anemia in chronic kidney disease: Secondary | ICD-10-CM | POA: Diagnosis present

## 2016-06-30 DIAGNOSIS — Z9842 Cataract extraction status, left eye: Secondary | ICD-10-CM

## 2016-06-30 DIAGNOSIS — Z09 Encounter for follow-up examination after completed treatment for conditions other than malignant neoplasm: Secondary | ICD-10-CM

## 2016-06-30 DIAGNOSIS — B964 Proteus (mirabilis) (morganii) as the cause of diseases classified elsewhere: Secondary | ICD-10-CM | POA: Diagnosis present

## 2016-06-30 DIAGNOSIS — J9601 Acute respiratory failure with hypoxia: Secondary | ICD-10-CM | POA: Diagnosis not present

## 2016-06-30 DIAGNOSIS — F0151 Vascular dementia with behavioral disturbance: Secondary | ICD-10-CM | POA: Diagnosis not present

## 2016-06-30 DIAGNOSIS — Z7982 Long term (current) use of aspirin: Secondary | ICD-10-CM

## 2016-06-30 DIAGNOSIS — N189 Chronic kidney disease, unspecified: Secondary | ICD-10-CM | POA: Diagnosis present

## 2016-06-30 DIAGNOSIS — E1169 Type 2 diabetes mellitus with other specified complication: Secondary | ICD-10-CM

## 2016-06-30 HISTORY — DX: Sepsis, unspecified organism: A41.9

## 2016-06-30 HISTORY — DX: Acute pyelonephritis: N10

## 2016-06-30 LAB — COMPREHENSIVE METABOLIC PANEL
ALT: 45 U/L (ref 17–63)
AST: 39 U/L (ref 15–41)
Albumin: 4 g/dL (ref 3.5–5.0)
Alkaline Phosphatase: 69 U/L (ref 38–126)
Anion gap: 12 (ref 5–15)
BUN: 24 mg/dL — ABNORMAL HIGH (ref 6–20)
CO2: 22 mmol/L (ref 22–32)
Calcium: 8.7 mg/dL — ABNORMAL LOW (ref 8.9–10.3)
Chloride: 104 mmol/L (ref 101–111)
Creatinine, Ser: 2.2 mg/dL — ABNORMAL HIGH (ref 0.61–1.24)
GFR calc Af Amer: 33 mL/min — ABNORMAL LOW (ref >60)
GFR calc non Af Amer: 29 mL/min — ABNORMAL LOW (ref >60)
Glucose, Bld: 130 mg/dL — ABNORMAL HIGH (ref 65–99)
Potassium: 3.8 mmol/L (ref 3.5–5.1)
Sodium: 138 mmol/L (ref 135–145)
Total Bilirubin: 2.5 mg/dL — ABNORMAL HIGH (ref 0.3–1.2)
Total Protein: 7.6 g/dL (ref 6.5–8.1)

## 2016-06-30 LAB — URINALYSIS, ROUTINE W REFLEX MICROSCOPIC
Bilirubin Urine: NEGATIVE
Glucose, UA: 50 mg/dL — AB
Ketones, ur: NEGATIVE mg/dL
Nitrite: NEGATIVE
Protein, ur: 30 mg/dL — AB
Specific Gravity, Urine: 1.006 (ref 1.005–1.030)
pH: 5 (ref 5.0–8.0)

## 2016-06-30 LAB — CBC WITH DIFFERENTIAL/PLATELET
Basophils Absolute: 0 10*3/uL (ref 0.0–0.1)
Basophils Relative: 0 %
Eosinophils Absolute: 0 10*3/uL (ref 0.0–0.7)
Eosinophils Relative: 0 %
HCT: 33.7 % — ABNORMAL LOW (ref 39.0–52.0)
Hemoglobin: 11.1 g/dL — ABNORMAL LOW (ref 13.0–17.0)
Lymphocytes Relative: 16 %
Lymphs Abs: 2.2 10*3/uL (ref 0.7–4.0)
MCH: 30.3 pg (ref 26.0–34.0)
MCHC: 32.9 g/dL (ref 30.0–36.0)
MCV: 92.1 fL (ref 78.0–100.0)
Monocytes Absolute: 0.9 10*3/uL (ref 0.1–1.0)
Monocytes Relative: 6 %
Neutro Abs: 10.8 10*3/uL — ABNORMAL HIGH (ref 1.7–7.7)
Neutrophils Relative %: 78 %
Platelets: 218 10*3/uL (ref 150–400)
RBC: 3.66 MIL/uL — ABNORMAL LOW (ref 4.22–5.81)
RDW: 14 % (ref 11.5–15.5)
WBC: 14 10*3/uL — ABNORMAL HIGH (ref 4.0–10.5)

## 2016-06-30 LAB — I-STAT CG4 LACTIC ACID, ED
Lactic Acid, Venous: 4.37 mmol/L (ref 0.5–1.9)
Lactic Acid, Venous: 1.52 mmol/L (ref 0.5–1.9)

## 2016-06-30 LAB — GLUCOSE, CAPILLARY: Glucose-Capillary: 110 mg/dL — ABNORMAL HIGH (ref 65–99)

## 2016-06-30 LAB — BILIRUBIN, FRACTIONATED(TOT/DIR/INDIR)
Bilirubin, Direct: 0.5 mg/dL (ref 0.1–0.5)
Indirect Bilirubin: 1.5 mg/dL — ABNORMAL HIGH (ref 0.3–0.9)
Total Bilirubin: 2 mg/dL — ABNORMAL HIGH (ref 0.3–1.2)

## 2016-06-30 MED ORDER — ACETAMINOPHEN 325 MG PO TABS
650.0000 mg | ORAL_TABLET | Freq: Four times a day (QID) | ORAL | Status: DC | PRN
  Administered 2016-07-01 – 2016-07-02 (×3): 650 mg via ORAL
  Filled 2016-06-30 (×4): qty 2

## 2016-06-30 MED ORDER — SODIUM CHLORIDE 0.9 % IV SOLN
INTRAVENOUS | Status: DC
  Administered 2016-06-30: 22:00:00 via INTRAVENOUS
  Administered 2016-07-01: 1000 mL via INTRAVENOUS
  Administered 2016-07-01 – 2016-07-02 (×2): via INTRAVENOUS

## 2016-06-30 MED ORDER — ONDANSETRON HCL 4 MG PO TABS: 4.0000 mg | ORAL_TABLET | Freq: Four times a day (QID) | ORAL | Status: DC | PRN

## 2016-06-30 MED ORDER — INSULIN GLARGINE 100 UNIT/ML ~~LOC~~ SOLN
20.0000 [IU] | Freq: Every day | SUBCUTANEOUS | Status: DC
  Administered 2016-06-30 – 2016-07-03 (×4): 20 [IU] via SUBCUTANEOUS
  Filled 2016-06-30 (×4): qty 0.2

## 2016-06-30 MED ORDER — INSULIN ASPART 100 UNIT/ML ~~LOC~~ SOLN
0.0000 [IU] | Freq: Three times a day (TID) | SUBCUTANEOUS | Status: DC
  Administered 2016-07-01 – 2016-07-03 (×6): 3 [IU] via SUBCUTANEOUS
  Administered 2016-07-03: 2 [IU] via SUBCUTANEOUS

## 2016-06-30 MED ORDER — ONDANSETRON HCL 4 MG/2ML IJ SOLN: 4.0000 mg | Freq: Four times a day (QID) | INTRAMUSCULAR | Status: DC | PRN

## 2016-06-30 MED ORDER — GABAPENTIN 300 MG PO CAPS
300.0000 mg | ORAL_CAPSULE | Freq: Two times a day (BID) | ORAL | Status: DC
  Administered 2016-06-30 – 2016-07-04 (×8): 300 mg via ORAL
  Filled 2016-06-30 (×8): qty 1

## 2016-06-30 MED ORDER — ASPIRIN EC 81 MG PO TBEC
81.0000 mg | DELAYED_RELEASE_TABLET | Freq: Every day | ORAL | Status: DC
  Administered 2016-07-01 – 2016-07-04 (×4): 81 mg via ORAL
  Filled 2016-06-30 (×4): qty 1

## 2016-06-30 MED ORDER — ACETAMINOPHEN 650 MG RE SUPP: 650.0000 mg | Freq: Four times a day (QID) | RECTAL | Status: DC | PRN

## 2016-06-30 MED ORDER — DEXTROSE 5 % IV SOLN
1.0000 g | INTRAVENOUS | Status: DC
  Administered 2016-07-01: 1 g via INTRAVENOUS
  Filled 2016-06-30 (×2): qty 10

## 2016-06-30 MED ORDER — INSULIN ASPART 100 UNIT/ML ~~LOC~~ SOLN: 0.0000 [IU] | Freq: Every day | SUBCUTANEOUS | Status: DC

## 2016-06-30 MED ORDER — SODIUM CHLORIDE 0.9 % IV BOLUS (SEPSIS)
1000.0000 mL | Freq: Once | INTRAVENOUS | Status: AC
  Administered 2016-06-30: 1000 mL via INTRAVENOUS

## 2016-06-30 MED ORDER — CLOPIDOGREL BISULFATE 75 MG PO TABS
75.0000 mg | ORAL_TABLET | Freq: Every day | ORAL | Status: DC
  Administered 2016-07-01 – 2016-07-04 (×4): 75 mg via ORAL
  Filled 2016-06-30 (×4): qty 1

## 2016-06-30 MED ORDER — DEXTROSE 5 % IV SOLN
2.0000 g | Freq: Once | INTRAVENOUS | Status: AC
  Administered 2016-06-30: 2 g via INTRAVENOUS
  Filled 2016-06-30: qty 2

## 2016-06-30 MED ORDER — ENOXAPARIN SODIUM 30 MG/0.3ML ~~LOC~~ SOLN
30.0000 mg | SUBCUTANEOUS | Status: DC
  Administered 2016-06-30: 30 mg via SUBCUTANEOUS
  Filled 2016-06-30: qty 0.3

## 2016-06-30 MED ORDER — ROSUVASTATIN CALCIUM 5 MG PO TABS
5.0000 mg | ORAL_TABLET | Freq: Every day | ORAL | Status: DC
  Administered 2016-06-30 – 2016-07-03 (×4): 5 mg via ORAL
  Filled 2016-06-30 (×4): qty 1

## 2016-06-30 MED ORDER — METOPROLOL TARTRATE 50 MG PO TABS
50.0000 mg | ORAL_TABLET | Freq: Two times a day (BID) | ORAL | Status: DC
  Administered 2016-06-30 – 2016-07-04 (×8): 50 mg via ORAL
  Filled 2016-06-30 (×8): qty 1

## 2016-06-30 NOTE — ED Notes (Signed)
Family at bedside. 

## 2016-06-30 NOTE — ED Triage Notes (Signed)
Pt family states they brought him on Thursday for hypotension. On Thursday they cathed him for a urine sample. Discharged with dehydration. Pt family states that since Thursday his urine has been red and hes been bleeding from his penis. Family states that the patient is very confused. Reported fever 101 at home. Pt is tachycardic in triage. Pt c/o back pain as well.

## 2016-06-30 NOTE — ED Notes (Signed)
Attempted second PIV. No success will ask another RN attempt.
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
## 2016-06-30 NOTE — ED Notes (Signed)
ED Provider at bedside. 

## 2016-06-30 NOTE — ED Notes (Addendum)
Pt states he is unable to void, RN attempted to collect. In and out cath completed by EMT/Tech.
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
## 2016-06-30 NOTE — ED Notes (Signed)
Dr Jeneen Rinks given a copy of lactic acid results 4.37

## 2016-06-30 NOTE — ED Notes (Signed)
Attempted to call report

## 2016-07-01 DIAGNOSIS — Z794 Long term (current) use of insulin: Secondary | ICD-10-CM

## 2016-07-01 DIAGNOSIS — R319 Hematuria, unspecified: Secondary | ICD-10-CM

## 2016-07-01 DIAGNOSIS — I10 Essential (primary) hypertension: Secondary | ICD-10-CM

## 2016-07-01 DIAGNOSIS — N183 Chronic kidney disease, stage 3 (moderate): Secondary | ICD-10-CM

## 2016-07-01 DIAGNOSIS — A419 Sepsis, unspecified organism: Principal | ICD-10-CM

## 2016-07-01 DIAGNOSIS — E1122 Type 2 diabetes mellitus with diabetic chronic kidney disease: Secondary | ICD-10-CM

## 2016-07-01 DIAGNOSIS — N390 Urinary tract infection, site not specified: Secondary | ICD-10-CM

## 2016-07-01 DIAGNOSIS — N10 Acute tubulo-interstitial nephritis: Secondary | ICD-10-CM

## 2016-07-01 LAB — SODIUM, URINE, RANDOM: Sodium, Ur: 61 mmol/L

## 2016-07-01 LAB — COMPREHENSIVE METABOLIC PANEL
ALT: 38 U/L (ref 17–63)
AST: 37 U/L (ref 15–41)
Albumin: 3.2 g/dL — ABNORMAL LOW (ref 3.5–5.0)
Alkaline Phosphatase: 61 U/L (ref 38–126)
Anion gap: 9 (ref 5–15)
BUN: 21 mg/dL — ABNORMAL HIGH (ref 6–20)
CO2: 22 mmol/L (ref 22–32)
Calcium: 7.7 mg/dL — ABNORMAL LOW (ref 8.9–10.3)
Chloride: 106 mmol/L (ref 101–111)
Creatinine, Ser: 1.5 mg/dL — ABNORMAL HIGH (ref 0.61–1.24)
GFR calc Af Amer: 53 mL/min — ABNORMAL LOW (ref >60)
GFR calc non Af Amer: 46 mL/min — ABNORMAL LOW (ref >60)
Glucose, Bld: 113 mg/dL — ABNORMAL HIGH (ref 65–99)
Potassium: 3.8 mmol/L (ref 3.5–5.1)
Sodium: 137 mmol/L (ref 135–145)
Total Bilirubin: 2.3 mg/dL — ABNORMAL HIGH (ref 0.3–1.2)
Total Protein: 6.5 g/dL (ref 6.5–8.1)

## 2016-07-01 LAB — CBC
HCT: 29.1 % — ABNORMAL LOW (ref 39.0–52.0)
Hemoglobin: 9.6 g/dL — ABNORMAL LOW (ref 13.0–17.0)
MCH: 30.3 pg (ref 26.0–34.0)
MCHC: 33 g/dL (ref 30.0–36.0)
MCV: 91.8 fL (ref 78.0–100.0)
Platelets: 177 10*3/uL (ref 150–400)
RBC: 3.17 MIL/uL — ABNORMAL LOW (ref 4.22–5.81)
RDW: 14 % (ref 11.5–15.5)
WBC: 16.9 10*3/uL — ABNORMAL HIGH (ref 4.0–10.5)

## 2016-07-01 LAB — MRSA PCR SCREENING: MRSA by PCR: NEGATIVE

## 2016-07-01 LAB — LACTIC ACID, PLASMA: Lactic Acid, Venous: 1.2 mmol/L (ref 0.5–1.9)

## 2016-07-01 LAB — GLUCOSE, CAPILLARY
Glucose-Capillary: 98 mg/dL (ref 65–99)
Glucose-Capillary: 174 mg/dL — ABNORMAL HIGH (ref 65–99)
Glucose-Capillary: 178 mg/dL — ABNORMAL HIGH (ref 65–99)
Glucose-Capillary: 171 mg/dL — ABNORMAL HIGH (ref 65–99)

## 2016-07-01 LAB — CREATININE, URINE, RANDOM: Creatinine, Urine: 356.2 mg/dL

## 2016-07-01 MED ORDER — HYDRALAZINE HCL 20 MG/ML IJ SOLN
10.0000 mg | Freq: Four times a day (QID) | INTRAMUSCULAR | Status: DC | PRN
  Administered 2016-07-01 – 2016-07-03 (×2): 10 mg via INTRAVENOUS
  Filled 2016-07-01 (×3): qty 1

## 2016-07-01 MED ORDER — ENOXAPARIN SODIUM 40 MG/0.4ML ~~LOC~~ SOLN
40.0000 mg | SUBCUTANEOUS | Status: DC
  Administered 2016-07-01 – 2016-07-03 (×3): 40 mg via SUBCUTANEOUS
  Filled 2016-07-01 (×3): qty 0.4

## 2016-07-01 NOTE — Progress Notes (Addendum)
PROGRESS NOTE    Harry [name]  TKZ:[id number] DOB: 1945-05-14 DOA: 06/30/2016 PCP: [name], MD    Brief Narrative: Harry [name] is a 71 y.o. male with a past medical history significant for HTN, IDDM, hx of stroke, left-sided blindness, CKD baseline Cr 1.5, and recent new onset dementia who presents with lethargy, fever, hematuria.  Assessment & Plan:   Principal Problem:   Sepsis ([place]) Active Problems:   DM2 (diabetes mellitus, type 2) ([place])   Essential hypertension   Acute renal failure superimposed on stage 3 chronic kidney disease ([place])   Acute pyelonephritis   Hyperbilirubinemia  Sepsis from acute pyelonephritis:  Improving,  Lactate normal today.  bp improved.  Tachycardia resolved.  Tachypnea, resolved. Still low grade fever and persistent leukocytosis.  Plan to continue with IV antibiotics, IV fluids and pain control.   Stage 3 ckd: creatinine at baseline.   Diabetes Mellitus:  CBG (last 3)   Recent Labs  06/30/16 2158 07/01/16 0749 07/01/16 1225  GLUCAP 110* 98 174*    Resume SSI and lantus.    Normocytic anemia:  Hemoglobin baseline between 10 to 12.      DVT prophylaxis: (Lovenox) Code Status: (Full Family Communication: none at bedside.  Disposition Plan: pending further eval.    Consultants:   None.    Procedures: none    Antimicrobials: rocephin    Subjective: No new complaints.   Objective: Vitals:   07/01/16 0805 07/01/16 0815 07/01/16 1225 07/01/16 1238  BP: (!) 183/86  (!) 155/76   Pulse: 94 99 84 88  Resp:  (!) 24 18 17   Temp:   99.9 F (37.7 C)   TempSrc:   Oral   SpO2:  96% 97% 95%  Weight:      Height:        Intake/Output Summary (Last 24 hours) at 07/01/16 1352 Last data filed at 07/01/16 1231  Gross per 24 hour  Intake           4662.5 ml  Output             1075 ml  Net           3587.5 ml   Filed Weights   06/30/16 2100  Weight: 91 kg (200 lb 9.9 oz)    Examination:  General  exam: Appears calm and comfortable  Respiratory system: Clear to auscultation. Respiratory effort normal. Cardiovascular system: S1 & S2 heard, RRR. No JVD, murmurs, rubs, gallops or clicks. No pedal edema. Gastrointestinal system: Abdomen is nondistended, soft and nontender. No organomegaly or masses felt. Normal bowel sounds heard. Central nervous system: Alert but confused. No focal neurological deficits. Extremities: Symmetric 5 x 5 power. Skin: No rashes, lesions or ulcers     Data Reviewed: I have personally reviewed following labs and imaging studies  CBC:  Recent Labs Lab 06/28/16 1233 06/30/16 1547 07/01/16 0313  WBC 6.3 14.0* 16.9*  NEUTROABS  --  10.8*  --   HGB 11.2* 11.1* 9.6*  HCT 33.9* 33.7* 29.1*  MCV 93.1 92.1 91.8  PLT 228 218 573   Basic Metabolic Panel:  Recent Labs Lab 06/28/16 1233 06/30/16 1547 07/01/16 0313  NA 143 138 137  K 3.9 3.8 3.8  CL 108 104 106  CO2 28 22 22   GLUCOSE 84 130* 113*  BUN 17 24* 21*  CREATININE 1.54* 2.20* 1.50*  CALCIUM 9.1 8.7* 7.7*   GFR: Estimated Creatinine Clearance: 55.6 mL/min (A) (by C-G formula based on SCr of  1.5 mg/dL (H)). Liver Function Tests:  Recent Labs Lab 06/30/16 1547 06/30/16 2205 07/01/16 0313  AST 39  --  37  ALT 45  --  38  ALKPHOS 69  --  61  BILITOT 2.5* 2.0* 2.3*  PROT 7.6  --  6.5  ALBUMIN 4.0  --  3.2*   No results for input(s): LIPASE, AMYLASE in the last 168 hours. No results for input(s): AMMONIA in the last 168 hours. Coagulation Profile: No results for input(s): INR, PROTIME in the last 168 hours. Cardiac Enzymes: No results for input(s): CKTOTAL, CKMB, CKMBINDEX, TROPONINI in the last 168 hours. BNP (last 3 results) No results for input(s): PROBNP in the last 8760 hours. HbA1C: No results for input(s): HGBA1C in the last 72 hours. CBG:  Recent Labs Lab 06/28/16 1814 06/30/16 2158 07/01/16 0749 07/01/16 1225  GLUCAP 127* 110* 98 174*   Lipid Profile: No  results for input(s): CHOL, HDL, LDLCALC, TRIG, CHOLHDL, LDLDIRECT in the last 72 hours. Thyroid Function Tests: No results for input(s): TSH, T4TOTAL, FREET4, T3FREE, THYROIDAB in the last 72 hours. Anemia Panel: No results for input(s): VITAMINB12, FOLATE, FERRITIN, TIBC, IRON, RETICCTPCT in the last 72 hours. Sepsis Labs:  Recent Labs Lab 06/30/16 1557 06/30/16 1830 07/01/16 0313  LATICACIDVEN 4.37* 1.52 1.2    Recent Results (from the past 240 hour(s))  Culture, blood (Routine x 2)     Status: None (Preliminary result)   Collection Time: 06/30/16  3:47 PM  Result Value Ref Range Status   Specimen Description BLOOD RIGHT ANTECUBITAL  Final   Special Requests   Final    BOTTLES DRAWN AEROBIC AND ANAEROBIC Blood Culture adequate volume   Culture NO GROWTH < 24 HOURS  Final   Report Status PENDING  Incomplete  Culture, blood (Routine x 2)     Status: None (Preliminary result)   Collection Time: 06/30/16  5:18 PM  Result Value Ref Range Status   Specimen Description BLOOD RIGHT HAND  Final   Special Requests   Final    BOTTLES DRAWN AEROBIC AND ANAEROBIC Blood Culture adequate volume   Culture NO GROWTH < 24 HOURS  Final   Report Status PENDING  Incomplete  MRSA PCR Screening     Status: None   Collection Time: 07/01/16 12:38 AM  Result Value Ref Range Status   MRSA by PCR NEGATIVE NEGATIVE Final    Comment:        The GeneXpert MRSA Assay (FDA approved for NASAL specimens only), is one component of a comprehensive MRSA colonization surveillance program. It is not intended to diagnose MRSA infection nor to guide or monitor treatment for MRSA infections.          Radiology Studies: Dg Chest Port 1 View  Result Date: 06/30/2016 CLINICAL DATA:  Hematuria.  History of diabetes and hypertension. EXAM: PORTABLE CHEST 1 VIEW COMPARISON:  06/05/2016 FINDINGS: Cardiac silhouette is normal in size and configuration. No mediastinal or hilar masses. No evidence of  adenopathy. Clear lungs.  No pleural effusion.  No pneumothorax. Skeletal structures are intact. IMPRESSION: No active disease. Electronically Signed   By: Lajean Manes M.D.   On: 06/30/2016 17:53   Ct Renal Stone Study  Result Date: 06/30/2016 CLINICAL DATA:  Hematuria.  No complaints of pain at this time. EXAM: CT ABDOMEN AND PELVIS WITHOUT CONTRAST TECHNIQUE: Multidetector CT imaging of the abdomen and pelvis was performed following the standard protocol without IV contrast. COMPARISON:  None. FINDINGS: Lower chest: No acute  abnormality. Hepatobiliary: Normal liver. There multiple dependent gallstones. No gallbladder wall thickening or inflammation. No bile duct dilation. Pancreas: Unremarkable. No pancreatic ductal dilatation or surrounding inflammatory changes. Spleen: Normal in size without focal abnormality. Adrenals/Urinary Tract: 19 mm right adrenal mass with average Hounsfield units of 18. This is likely an adenoma. Normal left adrenal gland. The kidneys are normal in size and position. There are 2 low-density lesions arising from the mid and lower pole the right kidney, largest measuring 11 mm, most likely cysts. No other renal masses. No renal stones. No hydronephrosis. Normal ureters. Bladder wall is mildly thickened. No bladder mass or stone. Stomach/Bowel: Stomach and small bowel are unremarkable. Mild increased stool in the colon. No colonic wall thickening or inflammation. Colon otherwise unremarkable. Normal appendix visualized. Vascular/Lymphatic: Aortic atherosclerosis. No enlarged abdominal or pelvic lymph nodes. Reproductive: Prostate gland is enlarged, elevating the bladder base. It measures 6.0 x 5.8 x 6.4 cm. Other: No abdominal wall hernia or abnormality. No abdominopelvic ascites. Musculoskeletal: No acute or significant osseous findings. IMPRESSION: 1. No acute findings. No renal or ureteral stones or obstructive uropathy. 2. Mild thickening of the wall of the bladder. This is likely  due to a degree of bladder outlet obstruction. 3. The prostate gland is significantly enlarged. 4. Multiple gallstones.  No acute cholecystitis. 5. 19 mm right adrenal mass most likely an adenoma. 6. Two low-density right renal lesions, not fully characterize but likely cysts. 7. Aortic atherosclerosis. Electronically Signed   By: Lajean Manes M.D.   On: 06/30/2016 22:39        Scheduled Meds: . aspirin EC  81 mg Oral Daily  . clopidogrel  75 mg Oral Q breakfast  . enoxaparin (LOVENOX) injection  40 mg Subcutaneous Q24H  . gabapentin  300 mg Oral BID  . insulin aspart  0-15 Units Subcutaneous TID WC  . insulin aspart  0-5 Units Subcutaneous QHS  . insulin glargine  20 Units Subcutaneous QHS  . metoprolol  50 mg Oral BID  . rosuvastatin  5 mg Oral QHS   Continuous Infusions: . sodium chloride 1,000 mL (07/01/16 1312)  . cefTRIAXone (ROCEPHIN)  IV       LOS: 1 day    Time spent: 30 minutes.    Hosie Poisson, MD [facility] Pager [phone]  If 7PM-7AM, please contact night-coverage [email or web address] Password [email or web address] 07/01/2016, 1:52 PM

## 2016-07-02 ENCOUNTER — Inpatient Hospital Stay (HOSPITAL_COMMUNITY): Payer: Medicare Other

## 2016-07-02 DIAGNOSIS — R0989 Other specified symptoms and signs involving the circulatory and respiratory systems: Secondary | ICD-10-CM

## 2016-07-02 LAB — IRON AND TIBC
Iron: 14 ug/dL — ABNORMAL LOW (ref 45–182)
Saturation Ratios: 7 % — ABNORMAL LOW (ref 17.9–39.5)
TIBC: 209 ug/dL — ABNORMAL LOW (ref 250–450)
UIBC: 195 ug/dL

## 2016-07-02 LAB — BASIC METABOLIC PANEL
Anion gap: 11 (ref 5–15)
BUN: 18 mg/dL (ref 6–20)
CO2: 20 mmol/L — ABNORMAL LOW (ref 22–32)
Calcium: 7.7 mg/dL — ABNORMAL LOW (ref 8.9–10.3)
Chloride: 105 mmol/L (ref 101–111)
Creatinine, Ser: 1.49 mg/dL — ABNORMAL HIGH (ref 0.61–1.24)
GFR calc Af Amer: 53 mL/min — ABNORMAL LOW (ref >60)
GFR calc non Af Amer: 46 mL/min — ABNORMAL LOW (ref >60)
Glucose, Bld: 165 mg/dL — ABNORMAL HIGH (ref 65–99)
Potassium: 3.3 mmol/L — ABNORMAL LOW (ref 3.5–5.1)
Sodium: 136 mmol/L (ref 135–145)

## 2016-07-02 LAB — GLUCOSE, CAPILLARY
Glucose-Capillary: 152 mg/dL — ABNORMAL HIGH (ref 65–99)
Glucose-Capillary: 169 mg/dL — ABNORMAL HIGH (ref 65–99)
Glucose-Capillary: 165 mg/dL — ABNORMAL HIGH (ref 65–99)
Glucose-Capillary: 161 mg/dL — ABNORMAL HIGH (ref 65–99)
Glucose-Capillary: 170 mg/dL — ABNORMAL HIGH (ref 65–99)

## 2016-07-02 LAB — CBC
HCT: 25.1 % — ABNORMAL LOW (ref 39.0–52.0)
Hemoglobin: 8.6 g/dL — ABNORMAL LOW (ref 13.0–17.0)
MCH: 31.6 pg (ref 26.0–34.0)
MCHC: 34.3 g/dL (ref 30.0–36.0)
MCV: 92.3 fL (ref 78.0–100.0)
Platelets: 161 10*3/uL (ref 150–400)
RBC: 2.72 MIL/uL — ABNORMAL LOW (ref 4.22–5.81)
RDW: 14.5 % (ref 11.5–15.5)
WBC: 12.5 10*3/uL — ABNORMAL HIGH (ref 4.0–10.5)

## 2016-07-02 LAB — RETICULOCYTES
RBC.: 2.8 MIL/uL — ABNORMAL LOW (ref 4.22–5.81)
Retic Count, Absolute: 50.4 10*3/uL (ref 19.0–186.0)
Retic Ct Pct: 1.8 % (ref 0.4–3.1)

## 2016-07-02 LAB — FERRITIN: Ferritin: 156 ng/mL (ref 24–336)

## 2016-07-02 LAB — VITAMIN B12: Vitamin B-12: 341 pg/mL (ref 180–914)

## 2016-07-02 LAB — FOLATE: Folate: 9.5 ng/mL (ref >5.9)

## 2016-07-02 LAB — HEMOGLOBIN A1C
Hgb A1c MFr Bld: 6.8 % — ABNORMAL HIGH (ref 4.8–5.6)
Mean Plasma Glucose: 148 mg/dL

## 2016-07-02 MED ORDER — PIPERACILLIN-TAZOBACTAM 3.375 G IVPB
3.3750 g | Freq: Three times a day (TID) | INTRAVENOUS | Status: DC
  Administered 2016-07-02 – 2016-07-03 (×5): 3.375 g via INTRAVENOUS
  Filled 2016-07-02 (×6): qty 50

## 2016-07-02 MED ORDER — FUROSEMIDE 10 MG/ML IJ SOLN
40.0000 mg | Freq: Once | INTRAMUSCULAR | Status: AC
  Administered 2016-07-02: 40 mg via INTRAVENOUS

## 2016-07-02 MED ORDER — FUROSEMIDE 10 MG/ML IJ SOLN
INTRAMUSCULAR | Status: AC
  Filled 2016-07-02: qty 4

## 2016-07-02 MED ORDER — VANCOMYCIN HCL IN DEXTROSE 750-5 MG/150ML-% IV SOLN
750.0000 mg | Freq: Two times a day (BID) | INTRAVENOUS | Status: DC
  Administered 2016-07-02 – 2016-07-03 (×3): 750 mg via INTRAVENOUS
  Filled 2016-07-02 (×4): qty 150

## 2016-07-02 MED ORDER — ALBUTEROL SULFATE (2.5 MG/3ML) 0.083% IN NEBU
2.5000 mg | INHALATION_SOLUTION | RESPIRATORY_TRACT | Status: DC | PRN
  Administered 2016-07-02: 2.5 mg via RESPIRATORY_TRACT
  Filled 2016-07-02: qty 3

## 2016-07-02 MED ORDER — POTASSIUM CHLORIDE CRYS ER 20 MEQ PO TBCR
40.0000 meq | EXTENDED_RELEASE_TABLET | Freq: Once | ORAL | Status: AC
  Administered 2016-07-02: 40 meq via ORAL
  Filled 2016-07-02: qty 2

## 2016-07-02 MED ORDER — LEVALBUTEROL HCL 1.25 MG/0.5ML IN NEBU
1.2500 mg | INHALATION_SOLUTION | Freq: Once | RESPIRATORY_TRACT | Status: AC
Start: 2016-07-02 — End: 2016-07-02
  Administered 2016-07-02: 1.25 mg via RESPIRATORY_TRACT
  Filled 2016-07-02: qty 0.5

## 2016-07-02 MED ORDER — LEVALBUTEROL HCL 1.25 MG/0.5ML IN NEBU
INHALATION_SOLUTION | RESPIRATORY_TRACT | Status: AC
  Administered 2016-07-02: 1.25 mg via RESPIRATORY_TRACT
  Filled 2016-07-02: qty 0.5

## 2016-07-02 MED ORDER — NICOTINE 21 MG/24HR TD PT24
21.0000 mg | MEDICATED_PATCH | Freq: Every day | TRANSDERMAL | Status: DC
  Administered 2016-07-02 – 2016-07-04 (×3): 21 mg via TRANSDERMAL
  Filled 2016-07-02 (×3): qty 1

## 2016-07-02 NOTE — Progress Notes (Signed)
PROGRESS NOTE    Harry [name]  CXK:[id number] DOB: February 09, 1946 DOA: 06/30/2016 PCP: [name], MD    Brief Narrative: Harry [name] is a 71 y.o. male with a past medical history significant for HTN, IDDM, hx of stroke, left-sided blindness, CKD baseline Cr 1.5, and recent new onset dementia who presents with lethargy, fever, hematuria.  Assessment & Plan:   Principal Problem:   Sepsis ([place]) Active Problems:   DM2 (diabetes mellitus, type 2) ([place])   Essential hypertension   Acute renal failure superimposed on stage 3 chronic kidney disease ([place])   Acute pyelonephritis   Hyperbilirubinemia  Sepsis from acute pyelonephritis:  Improving,  Lactate normal .  bp improved.  Tachycardia resolved.  Tachypnea, resolved. Still low grade fever and persistent leukocytosis.  Plan to continue with IV antibiotics, and pain control. Get PT evaluation    Stage 3 ckd: creatinine at baseline.   Diabetes Mellitus:  CBG (last 3)   Recent Labs  07/02/16 0317 07/02/16 0753 07/02/16 1133  GLUCAP 152* 169* 165*    Resume SSI and lantus.    Normocytic anemia:  Hemoglobin baseline between 10 to 12. Hemoglobin dropped to 8.6.  Get stool for occult blood.  Get anemia panel.    Hypokalemia replaced.   Fever, acute respiratory failure with hypoxia, earlier this am:  He was put on 3 lit [place] oxygen.  No wheezing heard.  CXR portable, opacities concerning fo rpneumonia vs pulm edema.  One dose of lasix ordered.  Continue with IV antibiotics.  Stopped the IV fluids.    DVT prophylaxis: (Lovenox) Code Status: (Full Family Communication: none at bedside.  Disposition Plan: pending further eval.    Consultants:   None.    Procedures: none    Antimicrobials: rocephin    Subjective: No new complaints.   Objective: Vitals:   07/02/16 0321 07/02/16 0353 07/02/16 0557 07/02/16 0824  BP:   (!) 157/77   Pulse: 93  96   Resp:   18   Temp:      TempSrc:      SpO2:  92% 100% 96% 94%  Weight:      Height:        Intake/Output Summary (Last 24 hours) at 07/02/16 1423 Last data filed at 07/02/16 1407  Gross per 24 hour  Intake          3926.25 ml  Output             1800 ml  Net          2126.25 ml   Filed Weights   06/30/16 2100  Weight: 91 kg (200 lb 9.9 oz)    Examination:  General exam: Appears calm and comfortable  Respiratory system: Clear to auscultation. Respiratory effort normal. Cardiovascular system: S1 & S2 heard, RRR. No JVD, murmurs, rubs, gallops or clicks. No pedal edema. Gastrointestinal system: Abdomen is nondistended, soft and nontender. No organomegaly or masses felt. Normal bowel sounds heard. Central nervous system: Alert but confused. No focal neurological deficits. Extremities: Symmetric 5 x 5 power. Skin: No rashes, lesions or ulcers     Data Reviewed: I have personally reviewed following labs and imaging studies  CBC:  Recent Labs Lab 06/28/16 1233 06/30/16 1547 07/01/16 0313 07/02/16 0514  WBC 6.3 14.0* 16.9* 12.5*  NEUTROABS  --  10.8*  --   --   HGB 11.2* 11.1* 9.6* 8.6*  HCT 33.9* 33.7* 29.1* 25.1*  MCV 93.1 92.1 91.8 92.3  PLT 228 218 177  482   Basic Metabolic Panel:  Recent Labs Lab 06/28/16 1233 06/30/16 1547 07/01/16 0313 07/02/16 0514  NA 143 138 137 136  K 3.9 3.8 3.8 3.3*  CL 108 104 106 105  CO2 28 22 22  20*  GLUCOSE 84 130* 113* 165*  BUN 17 24* 21* 18  CREATININE 1.54* 2.20* 1.50* 1.49*  CALCIUM 9.1 8.7* 7.7* 7.7*   GFR: Estimated Creatinine Clearance: 55.9 mL/min (A) (by C-G formula based on SCr of 1.49 mg/dL (H)). Liver Function Tests:  Recent Labs Lab 06/30/16 1547 06/30/16 2205 07/01/16 0313  AST 39  --  37  ALT 45  --  38  ALKPHOS 69  --  61  BILITOT 2.5* 2.0* 2.3*  PROT 7.6  --  6.5  ALBUMIN 4.0  --  3.2*   No results for input(s): LIPASE, AMYLASE in the last 168 hours. No results for input(s): AMMONIA in the last 168 hours. Coagulation Profile: No  results for input(s): INR, PROTIME in the last 168 hours. Cardiac Enzymes: No results for input(s): CKTOTAL, CKMB, CKMBINDEX, TROPONINI in the last 168 hours. BNP (last 3 results) No results for input(s): PROBNP in the last 8760 hours. HbA1C:  Recent Labs  07/01/16 0313  HGBA1C 6.8*   CBG:  Recent Labs Lab 07/01/16 1650 07/01/16 2153 07/02/16 0317 07/02/16 0753 07/02/16 1133  GLUCAP 178* 171* 152* 169* 165*   Lipid Profile: No results for input(s): CHOL, HDL, LDLCALC, TRIG, CHOLHDL, LDLDIRECT in the last 72 hours. Thyroid Function Tests: No results for input(s): TSH, T4TOTAL, FREET4, T3FREE, THYROIDAB in the last 72 hours. Anemia Panel: No results for input(s): VITAMINB12, FOLATE, FERRITIN, TIBC, IRON, RETICCTPCT in the last 72 hours. Sepsis Labs:  Recent Labs Lab 06/30/16 1557 06/30/16 1830 07/01/16 0313  LATICACIDVEN 4.37* 1.52 1.2    Recent Results (from the past 240 hour(s))  Culture, blood (Routine x 2)     Status: None (Preliminary result)   Collection Time: 06/30/16  3:47 PM  Result Value Ref Range Status   Specimen Description BLOOD RIGHT ANTECUBITAL  Final   Special Requests   Final    BOTTLES DRAWN AEROBIC AND ANAEROBIC Blood Culture adequate volume   Culture NO GROWTH < 24 HOURS  Final   Report Status PENDING  Incomplete  Culture, blood (Routine x 2)     Status: None (Preliminary result)   Collection Time: 06/30/16  5:18 PM  Result Value Ref Range Status   Specimen Description BLOOD RIGHT HAND  Final   Special Requests   Final    BOTTLES DRAWN AEROBIC AND ANAEROBIC Blood Culture adequate volume   Culture NO GROWTH < 24 HOURS  Final   Report Status PENDING  Incomplete  Urine culture     Status: Abnormal (Preliminary result)   Collection Time: 06/30/16  5:47 PM  Result Value Ref Range Status   Specimen Description URINE, RANDOM  Final   Special Requests NONE  Final   Culture (A)  Final    >=100,000 COLONIES/mL ESCHERICHIA COLI >=100,000  COLONIES/mL PROTEUS MIRABILIS    Report Status PENDING  Incomplete  MRSA PCR Screening     Status: None   Collection Time: 07/01/16 12:38 AM  Result Value Ref Range Status   MRSA by PCR NEGATIVE NEGATIVE Final    Comment:        The GeneXpert MRSA Assay (FDA approved for NASAL specimens only), is one component of a comprehensive MRSA colonization surveillance program. It is not intended to diagnose MRSA infection  nor to guide or monitor treatment for MRSA infections.          Radiology Studies: Dg Chest Port 1 View  Result Date: 07/02/2016 CLINICAL DATA:  Shortness of breath tonight. EXAM: PORTABLE CHEST 1 VIEW COMPARISON:  Chest radiograph June 30, 2016 FINDINGS: New interstitial and alveolar airspace opacities without pleural effusion. Cardiomediastinal silhouette is normal. No pneumothorax. Soft tissue planes and included osseous structures are nonsuspicious. IMPRESSION: New interstitial and alveolar airspace opacities concerning for pneumonia, there may be a component pulmonary edema. Followup PA and lateral chest X-ray is recommended in 3-4 weeks following trial of antibiotic therapy to ensure resolution and exclude underlying malignancy. Electronically Signed   By: Elon Alas M.D.   On: 07/02/2016 04:16   Dg Chest Port 1 View  Result Date: 06/30/2016 CLINICAL DATA:  Hematuria.  History of diabetes and hypertension. EXAM: PORTABLE CHEST 1 VIEW COMPARISON:  06/05/2016 FINDINGS: Cardiac silhouette is normal in size and configuration. No mediastinal or hilar masses. No evidence of adenopathy. Clear lungs.  No pleural effusion.  No pneumothorax. Skeletal structures are intact. IMPRESSION: No active disease. Electronically Signed   By: Lajean Manes M.D.   On: 06/30/2016 17:53   Ct Renal Stone Study  Result Date: 06/30/2016 CLINICAL DATA:  Hematuria.  No complaints of pain at this time. EXAM: CT ABDOMEN AND PELVIS WITHOUT CONTRAST TECHNIQUE: Multidetector CT imaging of the  abdomen and pelvis was performed following the standard protocol without IV contrast. COMPARISON:  None. FINDINGS: Lower chest: No acute abnormality. Hepatobiliary: Normal liver. There multiple dependent gallstones. No gallbladder wall thickening or inflammation. No bile duct dilation. Pancreas: Unremarkable. No pancreatic ductal dilatation or surrounding inflammatory changes. Spleen: Normal in size without focal abnormality. Adrenals/Urinary Tract: 19 mm right adrenal mass with average Hounsfield units of 18. This is likely an adenoma. Normal left adrenal gland. The kidneys are normal in size and position. There are 2 low-density lesions arising from the mid and lower pole the right kidney, largest measuring 11 mm, most likely cysts. No other renal masses. No renal stones. No hydronephrosis. Normal ureters. Bladder wall is mildly thickened. No bladder mass or stone. Stomach/Bowel: Stomach and small bowel are unremarkable. Mild increased stool in the colon. No colonic wall thickening or inflammation. Colon otherwise unremarkable. Normal appendix visualized. Vascular/Lymphatic: Aortic atherosclerosis. No enlarged abdominal or pelvic lymph nodes. Reproductive: Prostate gland is enlarged, elevating the bladder base. It measures 6.0 x 5.8 x 6.4 cm. Other: No abdominal wall hernia or abnormality. No abdominopelvic ascites. Musculoskeletal: No acute or significant osseous findings. IMPRESSION: 1. No acute findings. No renal or ureteral stones or obstructive uropathy. 2. Mild thickening of the wall of the bladder. This is likely due to a degree of bladder outlet obstruction. 3. The prostate gland is significantly enlarged. 4. Multiple gallstones.  No acute cholecystitis. 5. 19 mm right adrenal mass most likely an adenoma. 6. Two low-density right renal lesions, not fully characterize but likely cysts. 7. Aortic atherosclerosis. Electronically Signed   By: Lajean Manes M.D.   On: 06/30/2016 22:39        Scheduled  Meds: . aspirin EC  81 mg Oral Daily  . clopidogrel  75 mg Oral Q breakfast  . enoxaparin (LOVENOX) injection  40 mg Subcutaneous Q24H  . gabapentin  300 mg Oral BID  . insulin aspart  0-15 Units Subcutaneous TID WC  . insulin aspart  0-5 Units Subcutaneous QHS  . insulin glargine  20 Units Subcutaneous QHS  . metoprolol  50 mg Oral BID  . rosuvastatin  5 mg Oral QHS   Continuous Infusions: . sodium chloride 50 mL/hr at 07/02/16 1152  . piperacillin-tazobactam (ZOSYN)  IV 3.375 g (07/02/16 1301)  . vancomycin Stopped (07/02/16 0719)     LOS: 2 days    Time spent: 30 minutes.    Hosie Poisson, MD [facility] Pager [phone]  If 7PM-7AM, please contact night-coverage [email or web address] Password [email or web address] 07/02/2016, 2:23 PM

## 2016-07-02 NOTE — Progress Notes (Signed)
Pharmacy Antibiotic Note  Harry [name] is a 71 y.o. male admitted on 06/30/2016 with sepsis.  Pharmacy has been consulted for Vancomycin/Zosyn dosing for PNA. Initially on Ceftriaxone for ?urosepsis, now broadening to Vancomycin/Zosyn for PNA coverage. WBC increasing. Noted renal dysfunction.   Plan: Vancomycin 750 mg IV q12h Zosyn 3.375G IV q8h to be infused over 4 hours Trend WBC, temp, renal function  F/U infectious work-up Drug levels as indicated   Height: 6\' 3"  (190.5 cm) Weight: 200 lb 9.9 oz (91 kg) IBW/kg (Calculated) : 84.5  Temp (24hrs), Avg:100.4 F (38 C), Min:99.4 F (37.4 C), Max:101.8 F (38.8 C)   Recent Labs Lab 06/28/16 1233 06/30/16 1547 06/30/16 1557 06/30/16 1830 07/01/16 0313  WBC 6.3 14.0*  --   --  16.9*  CREATININE 1.54* 2.20*  --   --  1.50*  LATICACIDVEN  --   --  4.37* 1.52 1.2    Estimated Creatinine Clearance: 55.6 mL/min (A) (by C-G formula based on SCr of 1.5 mg/dL (H)).    Allergies  Allergen Reactions  . Lipitor [Atorvastatin] Other (See Comments)    Myalgia   . Statins Other (See Comments)    Myalgia (CAN tolerate Crestor, however)  . Pravachol [Pravastatin] Rash    Narda Bonds 07/02/2016 4:50 AM

## 2016-07-02 NOTE — Progress Notes (Signed)
Pt.c/o difficulty breathing.V/S taken,T=100.5;B/P=175/78;R=83%on RA.;HR=99;& noted crackles on left lower  & wheezing on the rt.lower.MD on call Sch[facility] was called & made aware.Received orders to give Lasix 40mg  iv & Xopenex breathing TX& portable CXR..Will cont.to monitor pt.

## 2016-07-02 NOTE — Progress Notes (Signed)
O auscultation, expiratory wheezing in upper lobes present, SpO2 97 on RA. Pt denied chest pain and SOB, no active orders for PRN breathing treatment. NP notified.

## 2016-07-02 NOTE — Progress Notes (Signed)
No continuous IV fluids infusing.

## 2016-07-03 ENCOUNTER — Inpatient Hospital Stay (HOSPITAL_COMMUNITY): Payer: Medicare Other

## 2016-07-03 LAB — URINE CULTURE: Culture: >=100000 — AB

## 2016-07-03 LAB — CBC
HCT: 26 % — ABNORMAL LOW (ref 39.0–52.0)
Hemoglobin: 8.9 g/dL — ABNORMAL LOW (ref 13.0–17.0)
MCH: 31.3 pg (ref 26.0–34.0)
MCHC: 34.2 g/dL (ref 30.0–36.0)
MCV: 91.5 fL (ref 78.0–100.0)
Platelets: 179 10*3/uL (ref 150–400)
RBC: 2.84 MIL/uL — ABNORMAL LOW (ref 4.22–5.81)
RDW: 14.3 % (ref 11.5–15.5)
WBC: 7.5 10*3/uL (ref 4.0–10.5)

## 2016-07-03 LAB — GLUCOSE, CAPILLARY
Glucose-Capillary: 134 mg/dL — ABNORMAL HIGH (ref 65–99)
Glucose-Capillary: 116 mg/dL — ABNORMAL HIGH (ref 65–99)
Glucose-Capillary: 184 mg/dL — ABNORMAL HIGH (ref 65–99)
Glucose-Capillary: 172 mg/dL — ABNORMAL HIGH (ref 65–99)

## 2016-07-03 LAB — BASIC METABOLIC PANEL
Anion gap: 7 (ref 5–15)
BUN: 17 mg/dL (ref 6–20)
CO2: 23 mmol/L (ref 22–32)
Calcium: 7.8 mg/dL — ABNORMAL LOW (ref 8.9–10.3)
Chloride: 109 mmol/L (ref 101–111)
Creatinine, Ser: 1.47 mg/dL — ABNORMAL HIGH (ref 0.61–1.24)
GFR calc Af Amer: 54 mL/min — ABNORMAL LOW (ref >60)
GFR calc non Af Amer: 47 mL/min — ABNORMAL LOW (ref >60)
Glucose, Bld: 146 mg/dL — ABNORMAL HIGH (ref 65–99)
Potassium: 3.6 mmol/L (ref 3.5–5.1)
Sodium: 139 mmol/L (ref 135–145)

## 2016-07-03 MED ORDER — CARBAMIDE PEROXIDE 6.5 % OT SOLN: 5.0000 [drp] | Freq: Two times a day (BID) | OTIC | 0 refills | Status: DC

## 2016-07-03 MED ORDER — CIPROFLOXACIN HCL 500 MG PO TABS
500.0000 mg | ORAL_TABLET | Freq: Two times a day (BID) | ORAL | Status: DC
  Administered 2016-07-03 – 2016-07-04 (×2): 500 mg via ORAL
  Filled 2016-07-03 (×2): qty 1

## 2016-07-03 MED ORDER — FERROUS SULFATE 325 (65 FE) MG PO TABS
325.0000 mg | ORAL_TABLET | Freq: Two times a day (BID) | ORAL | Status: DC
  Administered 2016-07-04: 325 mg via ORAL
  Filled 2016-07-03: qty 1

## 2016-07-03 MED ORDER — CARBAMIDE PEROXIDE 6.5 % OT SOLN
5.0000 [drp] | Freq: Two times a day (BID) | OTIC | Status: DC
  Administered 2016-07-03 – 2016-07-04 (×2): 5 [drp] via OTIC
  Filled 2016-07-03: qty 15

## 2016-07-03 MED ORDER — CIPROFLOXACIN HCL 500 MG PO TABS: 500.0000 mg | ORAL_TABLET | Freq: Two times a day (BID) | ORAL | 0 refills | Status: DC

## 2016-07-03 MED ORDER — FUROSEMIDE 10 MG/ML IJ SOLN
40.0000 mg | Freq: Once | INTRAMUSCULAR | Status: AC
  Administered 2016-07-03: 40 mg via INTRAVENOUS
  Filled 2016-07-03: qty 4

## 2016-07-03 NOTE — Progress Notes (Signed)
[facility]  Patient Status: Active (receiving services up to time of hospitalization)  [facility] is providing the following services: RN  If patient discharges after hours, please call [phone].   Harry [name] 07/03/2016, 4:23 PM

## 2016-07-03 NOTE — Progress Notes (Signed)
Pharmacy Antibiotic Note  Harry [name] is a 71 y.o. male on day # 4 antibiotics. Changing from Vanc/Zosyn for sepsis/pneumonia coverage to Cipro for UTI. E coli and Proteus in 4/28 urine culture.  Tmax 99.3, WBC 7.5.   Plan:  Cipro 500 mg PO BID.  Expect 5-7 days treatment.  Height: 6\' 3"  (190.5 cm) Weight: 200 lb 9.9 oz (91 kg) IBW/kg (Calculated) : 84.5  Temp (24hrs), Avg:98.8 F (37.1 C), Min:98.2 F (36.8 C), Max:99.3 F (37.4 C)   Recent Labs Lab 06/28/16 1233 06/30/16 1547 06/30/16 1557 06/30/16 1830 07/01/16 0313 07/02/16 0514 07/03/16 0406  WBC 6.3 14.0*  --   --  16.9* 12.5* 7.5  CREATININE 1.54* 2.20*  --   --  1.50* 1.49* 1.47*  LATICACIDVEN  --   --  4.37* 1.52 1.2  --   --     Estimated Creatinine Clearance: 56.7 mL/min (A) (by C-G formula based on SCr of 1.47 mg/dL (H)).    Allergies  Allergen Reactions  . Lipitor [Atorvastatin] Other (See Comments)    Myalgia   . Statins Other (See Comments)    Myalgia (CAN tolerate Crestor, however)  . Pravachol [Pravastatin] Rash    Antimicrobials this admission:  Ceftriaxone 4/28>>4/30  Vancomycin 4/30 >>5/1  Zosyn 4/30>>5/1  Cipro 5/1>>  Dose adjustments this admission:  n/a  Microbiology results:  4/28 blood x 2 - ng growth x 3 days to date  4/20 urine - > 100K/ml E coli and Proteus - both sensitive to Cipro  4/29 MRSA PCR negative  Thank you for allowing pharmacy to be a part of this patient's care.  Arty Baumgartner, [place] Pager: [phone] 07/03/2016 4:15 PM
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
## 2016-07-03 NOTE — Evaluation (Signed)
Physical Therapy Evaluation Patient Details Name: Harry [name] MRN: [id number] DOB: 07/16/1945 Today's Date: 07/03/2016   History of Present Illness  Harry [name] is a 71 y.o. male with a past medical history significant for HTN, IDDM, hx of stroke, left-sided blindness, CKD baseline Cr 1.5, and recent new onset dementia who presents with lethargy, fever, hematuria.  Clinical Impression  Pt admitted with/for above symptoms due to UTI.  Pt at min assist level on evaluation,.  Pt currently limited functionally due to the problems listed below.  (see problems list.)  Pt will benefit from PT to maximize function and safety to be able to get home safely with available assist of family.     Follow Up Recommendations Home health PT    Equipment Recommendations  None recommended by PT    Recommendations for Other Services       Precautions / Restrictions Precautions Precautions: Fall Restrictions Weight Bearing Restrictions: No      Mobility  Bed Mobility Overal bed mobility: Needs Assistance Bed Mobility: Supine to Sit     Supine to sit: Min assist     General bed mobility comments: needed assist to get forward until he could use bil UE's  Transfers Overall transfer level: Needs assistance   Transfers: Sit to/from Stand Sit to Stand: Min guard            Ambulation/Gait Ambulation/Gait assistance: Min assist Ambulation Distance (Feet): 70 Feet Assistive device:  (iv pole and rails--RW not readily available) Gait Pattern/deviations: Step-through pattern Gait velocity: slower Gait velocity interpretation: Below normal speed for age/gender General Gait Details: weak-kneed gait pattern, flexed posture.  Improving with distance and pt becoming less stiff.  Stairs            Wheelchair Mobility    Modified Rankin (Stroke Patients Only)       Balance Overall balance assessment: Needs assistance Sitting-balance support: No upper extremity  supported Sitting balance-Leahy Scale: Good Sitting balance - Comments: able to accept challenge and maintain balance   Standing balance support: No upper extremity supported Standing balance-Leahy Scale: Fair                               Pertinent Vitals/Pain Pain Assessment: No/denies pain    Home Living Family/patient expects to be discharged to:: Private residence Living Arrangements: Spouse/significant other Available Help at Discharge: Family;Available 24 hours/day;Other (Comment) Type of Home: Apartment Home Access: Level entry;Elevator     Home Layout: One level Home Equipment: Walker - 2 wheels;Cane - single point;Bedside commode;Tub bench      Prior Function Level of Independence: Needs assistance   Gait / Transfers Assistance Needed: uses a RW at home, when his vision is episodically worse, he needs more assistance  ADL's / Homemaking Assistance Needed: gets help from his son        Hand Dominance   Dominant Hand: Right    Extremity/Trunk Assessment   Upper Extremity Assessment Upper Extremity Assessment: Overall WFL for tasks assessed    Lower Extremity Assessment Lower Extremity Assessment: Overall WFL for tasks assessed (mild general weakness from 4 days inactivity)       Communication   Communication: No difficulties  Cognition Arousal/Alertness: Awake/alert Behavior During Therapy: WFL for tasks assessed/performed Overall Cognitive Status: Within Functional Limits for tasks assessed  General Comments      Exercises     Assessment/Plan    PT Assessment Patient needs continued PT services  PT Problem List Decreased strength;Decreased activity tolerance;Decreased mobility;Decreased balance;Decreased knowledge of use of DME       PT Treatment Interventions DME instruction;Gait training;Functional mobility training;Therapeutic activities;Balance training;Patient/family  education    PT Goals (Current goals can be found in the Care Plan section)  Acute Rehab PT Goals Patient Stated Goal: feel better and go home PT Goal Formulation: With patient Time For Goal Achievement: 07/10/16 Potential to Achieve Goals: Good    Frequency Min 3X/week   Barriers to discharge        Co-evaluation               AM-PAC PT "6 Clicks" Daily Activity  Outcome Measure Difficulty turning over in bed (including adjusting bedclothes, sheets and blankets)?: Total Difficulty moving from lying on back to sitting on the side of the bed? : Total Difficulty sitting down on and standing up from a chair with arms (e.g., wheelchair, bedside commode, etc,.)?: A Little Help needed moving to and from a bed to chair (including a wheelchair)?: A Little Help needed walking in hospital room?: A Little Help needed climbing 3-5 steps with a railing? : A Little 6 Click Score: 14    End of Session   Activity Tolerance: Patient tolerated treatment well Patient left: in chair;with call bell/phone within reach Nurse Communication: Mobility status PT Visit Diagnosis: Unsteadiness on feet (R26.81);Other abnormalities of gait and mobility (R26.89)    Time: 1035-1110 PT Time Calculation (min) (ACUTE ONLY): 35 min   Charges:   PT Evaluation $PT Eval Moderate Complexity: 1 Procedure PT Treatments $Gait Training: 8-22 mins   PT G Codes:        07-21-2016  Donnella Sham, PT [phone]  (pager)  Harry [name] 07/21/2016, 11:24 AM

## 2016-07-03 NOTE — Care Management Note (Signed)
Case Management Note  Patient Details  Name: Harry [name] MRN: [id number] Date of Birth: 1945-08-31  Subjective/Objective:      Admitted with sepsis/ pyelonephritis, hx of HTN, IDDM, hx of stroke, left-sided blindness, CKD baseline Cr 1.5, and recent new onset dementiawho presents with lethargy, fever, hematuria. Resides with wife , [name]. Wife assists  with bathing, dressing.DME: cane,walker.  Mercy Riding (Spouse) [name] (Daughter)    [phone]      Action/Plan: Plan is to d/c pt with home health services (PT).  Expected Discharge Date:                  Expected Discharge Plan:  [place]  In-House Referral:     Discharge planning Services  CM Consult  Post Acute Care Choice:    Choice offered to:  Patient  DME Arranged:    DME Agency:     HH Arranged:    [place] Agency:  [place],  Status of Service:  In process, will continue to follow  If discussed at Long Length of Stay Meetings, dates discussed:    Additional Comments:  Sharin Mons, RN 07/03/2016, 3:11 PM

## 2016-07-03 NOTE — Consult Note (Addendum)
   [facility] CM Inpatient Consult   07/03/2016  Harry [name] February 16, 1946 [id number]   Patient evaluated for community based chronic disease management services with [facility] [place] [facility] as a benefit of patient's Medicare Insurance for multiple admissions and re-admission.. Chart reviewed and MD's History and Physical reveals the patient is Harry [name] is a 71 y.o. male with a past medical history significant for HTN, IDDM, hx of stroke, left-sided blindness, CKD baseline Cr 1.5, and recent new onset dementia who presents with lethargy, fever, hematuria. Spoke with patient and wife Harry [name] at bedside to explain [place] [facility] services. Wife states that they live in [place] but will be going to their daughter's, Harry [name], [phone], home in [place] at discharge because both of them have been sick and active with [facility]. Consent form signed and folder with information was given.  Patient will receive post hospital discharge call and will be evaluated for monthly home visits for assessments and disease process education.  Left contact information and [facility] literature at bedside. Made Inpatient Case Manager aware that [place] Management following. Of note, [facility] Care Management services does not replace or interfere with any services that are arranged by inpatient case management or social work.  For additional questions or referrals please contact:    Primary Care Provider verified:  Dr. [name]  Natividad Brood, RN BSN [facility] Liaison  [phone] business mobile phone Toll free office [phone]

## 2016-07-04 ENCOUNTER — Ambulatory Visit (INDEPENDENT_AMBULATORY_CARE_PROVIDER_SITE_OTHER): Payer: Medicare Other | Admitting: Diagnostic Neuroimaging

## 2016-07-04 ENCOUNTER — Encounter: Payer: Self-pay | Admitting: Diagnostic Neuroimaging

## 2016-07-04 VITALS — BP 158/84 | HR 77 | Wt 197.6 lb

## 2016-07-04 DIAGNOSIS — F0151 Vascular dementia with behavioral disturbance: Secondary | ICD-10-CM

## 2016-07-04 DIAGNOSIS — F0391 Unspecified dementia with behavioral disturbance: Secondary | ICD-10-CM | POA: Diagnosis not present

## 2016-07-04 DIAGNOSIS — F01518 Vascular dementia, unspecified severity, with other behavioral disturbance: Secondary | ICD-10-CM

## 2016-07-04 DIAGNOSIS — E278 Other specified disorders of adrenal gland: Secondary | ICD-10-CM | POA: Diagnosis present

## 2016-07-04 DIAGNOSIS — F03B18 Unspecified dementia, moderate, with other behavioral disturbance: Secondary | ICD-10-CM

## 2016-07-04 LAB — GLUCOSE, CAPILLARY
Glucose-Capillary: 79 mg/dL (ref 65–99)
Glucose-Capillary: 94 mg/dL (ref 65–99)

## 2016-07-04 MED ORDER — SERTRALINE HCL 25 MG PO TABS: 25.0000 mg | ORAL_TABLET | Freq: Every day | ORAL | 12 refills | Status: DC

## 2016-07-04 MED ORDER — FERROUS SULFATE 325 (65 FE) MG PO TABS: 325.0000 mg | ORAL_TABLET | Freq: Two times a day (BID) | ORAL | 1 refills | Status: DC

## 2016-07-04 NOTE — Progress Notes (Addendum)
Harry [name] to be D/C'd Home per MD order.  Discussed with the patient, wife, and (daughter [name] on phone)  all questions fully answered.  VSS, Skin clean, dry and intact without evidence of skin break down, no evidence of skin tears noted. IV catheter discontinued intact. Site without signs and symptoms of complications. Dressing and pressure applied.  An After Visit Summary was printed and given to the patient. Patient received prescription.  D/c education completed with patient/family including follow up instructions, medication list, d/c activities limitations if indicated, with other d/c instructions as indicated by MD - patient able to verbalize understanding, all questions fully answered.   Patient instructed to return to ED, call 911, or call MD for any changes in condition.   Patient to be escorted via [place], and D/C home via private auto. Waiting for wife to pick up patient.   Jeet Shough C 07/04/2016 10:30 AM

## 2016-07-04 NOTE — Care Management Note (Signed)
Case Management Note  Patient Details  Name: Harry [name] MRN: [id number] Date of Birth: 22-Oct-1945  Subjective/Objective:         Admitted with sepsis/ pyelonephritis.           Mercy Riding (Spouse) [name] (Daughter)      [phone] (c)     PCP: [name]  Action/Plan: Plan is to d/c to daughter's home today with home health services ( RN,PT). Daughter's address: [address], [place], [place]. Agency is to contact daughter on cell to arrange home visits.  Expected Discharge Date:  07/04/16               Expected Discharge Plan:  [place]  In-House Referral:     Discharge planning Services  CM Consult  Post Acute Care Choice:    Choice offered to:  Patient  DME Arranged:    DME Agency:     HH Arranged:   RN,PT [place] Agency:  [place], resumption referral made with [facility]  Status of Service:  completed  If discussed at Long Length of Stay Meetings, dates discussed:    Additional Comments:  Sharin Mons, RN 07/04/2016, 10:25 AM
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
## 2016-07-04 NOTE — Patient Instructions (Signed)
-   stop donepezil  - start sertraline 25mg  daily  - visit website [email or web address] for more information  - I will setup palliative care consult  - ask to speak with social work at [facility]

## 2016-07-04 NOTE — Care Management Important Message (Signed)
Important Message  Patient Details  Name: Harry [name] MRN: [id number] Date of Birth: Nov 13, 1945   Medicare Important Message Given:  Yes    [name] Abena 07/04/2016, 9:15 AM

## 2016-07-05 ENCOUNTER — Other Ambulatory Visit: Payer: Self-pay

## 2016-07-05 DIAGNOSIS — N183 Chronic kidney disease, stage 3 (moderate): Secondary | ICD-10-CM | POA: Diagnosis not present

## 2016-07-05 DIAGNOSIS — G9340 Encephalopathy, unspecified: Secondary | ICD-10-CM | POA: Diagnosis not present

## 2016-07-05 DIAGNOSIS — E1122 Type 2 diabetes mellitus with diabetic chronic kidney disease: Secondary | ICD-10-CM | POA: Diagnosis not present

## 2016-07-05 DIAGNOSIS — I129 Hypertensive chronic kidney disease with stage 1 through stage 4 chronic kidney disease, or unspecified chronic kidney disease: Secondary | ICD-10-CM | POA: Diagnosis not present

## 2016-07-05 DIAGNOSIS — I161 Hypertensive emergency: Secondary | ICD-10-CM | POA: Diagnosis not present

## 2016-07-05 DIAGNOSIS — E1142 Type 2 diabetes mellitus with diabetic polyneuropathy: Secondary | ICD-10-CM | POA: Diagnosis not present

## 2016-07-05 LAB — CULTURE, BLOOD (ROUTINE X 2)
Culture: NO GROWTH
Culture: NO GROWTH
Special Requests: ADEQUATE
Special Requests: ADEQUATE

## 2016-07-05 NOTE — Care Management Important Message (Signed)
Important Message  Patient Details  Name: Harry [name] MRN: [id number] Date of Birth: [birth year]   Medicare Important Message Given:  Yes    Khadijah Mastrianni Abena 07/05/2016, 11:18 AM

## 2016-07-06 ENCOUNTER — Other Ambulatory Visit: Payer: Self-pay

## 2016-07-06 ENCOUNTER — Telehealth: Payer: Self-pay | Admitting: Diagnostic Neuroimaging

## 2016-07-06 NOTE — Telephone Encounter (Signed)
Beth/[facility] [phone] called is wanting a VO for Education officer, museum. She said PCP is out of the office today.  Please call, she is aware the clinic closes at noon

## 2016-07-06 NOTE — Patient Outreach (Signed)
      Unable to contact patient via telephone for transition of care. Attempts made to all contact numbers listed.  Plan: Contact primary care for additional contact information.

## 2016-07-06 NOTE — Telephone Encounter (Signed)
Per Dr Leta Baptist, spoke with Harry [name] and gave VO for SW for patient. She verbalized understanding, appreciation.

## 2016-07-10 ENCOUNTER — Other Ambulatory Visit: Payer: Self-pay

## 2016-07-10 DIAGNOSIS — I129 Hypertensive chronic kidney disease with stage 1 through stage 4 chronic kidney disease, or unspecified chronic kidney disease: Secondary | ICD-10-CM | POA: Diagnosis not present

## 2016-07-10 DIAGNOSIS — G9340 Encephalopathy, unspecified: Secondary | ICD-10-CM | POA: Diagnosis not present

## 2016-07-10 DIAGNOSIS — E1122 Type 2 diabetes mellitus with diabetic chronic kidney disease: Secondary | ICD-10-CM | POA: Diagnosis not present

## 2016-07-10 DIAGNOSIS — I161 Hypertensive emergency: Secondary | ICD-10-CM | POA: Diagnosis not present

## 2016-07-10 DIAGNOSIS — N183 Chronic kidney disease, stage 3 (moderate): Secondary | ICD-10-CM | POA: Diagnosis not present

## 2016-07-10 DIAGNOSIS — E1142 Type 2 diabetes mellitus with diabetic polyneuropathy: Secondary | ICD-10-CM | POA: Diagnosis not present

## 2016-07-10 NOTE — Patient Outreach (Signed)
   Second Unsuccessful attempt made to contact patient via telephone for community care coordination. Unsuccessful attempts made on 07/06/2016 as well.  Plan; Make 3rd attempt to contact patient via telephone within the next 21 days.

## 2016-07-11 ENCOUNTER — Other Ambulatory Visit: Payer: Self-pay

## 2016-07-11 NOTE — Patient Outreach (Signed)
   Third unsuccessful attempt made to contact patient via telephone for community care Coordination.  Plan: Discharge patient from my caseload in the next 10 days if no return calls from patient.

## 2016-07-11 NOTE — Patient Outreach (Signed)
    Another unsuccessful  attempt made to contact patient via telephone at # [phone] listed as patient's daughter's number.

## 2016-07-12 ENCOUNTER — Other Ambulatory Visit (HOSPITAL_COMMUNITY): Payer: Self-pay | Admitting: Internal Medicine

## 2016-07-12 ENCOUNTER — Ambulatory Visit (HOSPITAL_COMMUNITY)
Admission: RE | Admit: 2016-07-12 | Discharge: 2016-07-12 | Disposition: A | Discharge status: Home / Self Care | Payer: Medicare Other | Source: Ambulatory Visit | Attending: Internal Medicine | Admitting: Internal Medicine

## 2016-07-12 DIAGNOSIS — J189 Pneumonia, unspecified organism: Secondary | ICD-10-CM | POA: Diagnosis present

## 2016-07-12 DIAGNOSIS — I10 Essential (primary) hypertension: Secondary | ICD-10-CM | POA: Diagnosis not present

## 2016-07-12 DIAGNOSIS — E278 Other specified disorders of adrenal gland: Secondary | ICD-10-CM | POA: Diagnosis not present

## 2016-07-12 DIAGNOSIS — A419 Sepsis, unspecified organism: Secondary | ICD-10-CM | POA: Diagnosis not present

## 2016-07-12 DIAGNOSIS — Z8701 Personal history of pneumonia (recurrent): Secondary | ICD-10-CM | POA: Insufficient documentation

## 2016-07-12 DIAGNOSIS — E1122 Type 2 diabetes mellitus with diabetic chronic kidney disease: Secondary | ICD-10-CM | POA: Diagnosis not present

## 2016-07-12 DIAGNOSIS — N183 Chronic kidney disease, stage 3 (moderate): Secondary | ICD-10-CM | POA: Diagnosis not present

## 2016-07-12 DIAGNOSIS — R42 Dizziness and giddiness: Secondary | ICD-10-CM | POA: Diagnosis not present

## 2016-07-12 DIAGNOSIS — E1142 Type 2 diabetes mellitus with diabetic polyneuropathy: Secondary | ICD-10-CM | POA: Diagnosis not present

## 2016-07-12 DIAGNOSIS — G9340 Encephalopathy, unspecified: Secondary | ICD-10-CM | POA: Diagnosis not present

## 2016-07-12 DIAGNOSIS — I161 Hypertensive emergency: Secondary | ICD-10-CM | POA: Diagnosis not present

## 2016-07-12 DIAGNOSIS — I129 Hypertensive chronic kidney disease with stage 1 through stage 4 chronic kidney disease, or unspecified chronic kidney disease: Secondary | ICD-10-CM | POA: Diagnosis not present

## 2016-07-12 DIAGNOSIS — E1165 Type 2 diabetes mellitus with hyperglycemia: Secondary | ICD-10-CM | POA: Diagnosis not present

## 2016-07-13 ENCOUNTER — Other Ambulatory Visit: Payer: Self-pay

## 2016-07-13 DIAGNOSIS — E1142 Type 2 diabetes mellitus with diabetic polyneuropathy: Secondary | ICD-10-CM | POA: Diagnosis not present

## 2016-07-13 DIAGNOSIS — I129 Hypertensive chronic kidney disease with stage 1 through stage 4 chronic kidney disease, or unspecified chronic kidney disease: Secondary | ICD-10-CM | POA: Diagnosis not present

## 2016-07-13 DIAGNOSIS — I161 Hypertensive emergency: Secondary | ICD-10-CM | POA: Diagnosis not present

## 2016-07-13 DIAGNOSIS — N183 Chronic kidney disease, stage 3 (moderate): Secondary | ICD-10-CM | POA: Diagnosis not present

## 2016-07-13 DIAGNOSIS — E1122 Type 2 diabetes mellitus with diabetic chronic kidney disease: Secondary | ICD-10-CM | POA: Diagnosis not present

## 2016-07-13 DIAGNOSIS — G9340 Encephalopathy, unspecified: Secondary | ICD-10-CM | POA: Diagnosis not present

## 2016-07-13 NOTE — Patient Outreach (Signed)
[name]) Care Management   07/13/2016  Harry [name] 01-14-1946 [id number]  Harry [name] is an 71 y.o. male  Subjective:  I am doing okay. I just want to get back to my house.  Objective:   ROS Harry [name], pleasant, well nourished gentleman. Patient was sitting on living room couch with large [employer] towel covering his body.  Physical Exam ROS  Encounter Medications:   Outpatient Encounter Prescriptions as of 07/13/2016  Medication Sig Note  . acetaminophen (TYLENOL) 500 MG tablet Take 1,000 mg by mouth every 6 (six) hours as needed for fever or headache (pain).   [name] Kitchen amLODipine (NORVASC) 5 MG tablet Take 2.5 mg by mouth daily.   [name] Kitchen aspirin 81 MG EC tablet Take 1 tablet (81 mg total) by mouth daily.   . carbamide peroxide (DEBROX) 6.5 % otic solution Place 5 drops into the left ear 2 (two) times daily.   . ciprofloxacin (CIPRO) 500 MG tablet Take 1 tablet (500 mg total) by mouth 2 (two) times daily. (Patient not taking: Reported on 07/13/2016)   . clopidogrel (PLAVIX) 75 MG tablet TAKE 1 TABLET (75 MG TOTAL) BY MOUTH DAILY WITH BREAKFAST.   . ferrous sulfate 325 (65 FE) MG tablet Take 1 tablet (325 mg total) by mouth 2 (two) times daily with a meal.   . gabapentin (NEURONTIN) 300 MG capsule Take 300 mg by mouth 2 (two) times daily. Reported on 02/25/2015   . Insulin Glargine (BASAGLAR KWIKPEN) 100 UNIT/ML SOPN Inject 20 Units into the skin 2 (two) times daily.  06/05/2016: Dosage verified by the patient's daughter  . metoprolol (LOPRESSOR) 50 MG tablet Take 1 tablet (50 mg total) by mouth 2 (two) times daily. 06/30/2016: Ran out of tablets about a week ago - blood pressure has been running low so daughter is not sure if this will be continued.  . polyvinyl alcohol (ARTIFICIAL TEARS) 1.4 % ophthalmic solution Place 1 drop into both eyes daily as needed for dry eyes.   . rosuvastatin (CRESTOR) 5 MG tablet Take 5 mg by mouth at bedtime.   . sertraline (ZOLOFT) 25 MG  tablet Take 1 tablet (25 mg total) by mouth daily.   . Vitamin D, Ergocalciferol, (DRISDOL) 50000 units CAPS capsule Take 50,000 Units by mouth every Saturday.    No facility-administered encounter medications on file as of 07/13/2016.     Functional Status:   In your present state of health, do you have any difficulty performing the following activities: 07/13/2016 06/30/2016  Hearing? N N  Vision? Y Y  Difficulty concentrating or making decisions? N N  Walking or climbing stairs? Y Y  Dressing or bathing? N N  Doing errands, shopping? Y N  Preparing Food and eating ? Y -  Using the Toilet? N -  In the past six months, have you accidently leaked urine? N -  Do you have problems with loss of bowel control? N -  Managing your Medications? N -  Managing your Finances? Y -  Housekeeping or managing your Housekeeping? Y -  Some recent data might be hidden    Fall/Depression Screening:    Fall Risk  07/13/2016 11/11/2015 10/31/2015  Falls in the past year? Yes Yes Yes  Number falls in past yr: 1 2 or more 2 or more  Injury with Fall? No No No  Risk for fall due to : History of fall(s);Impaired balance/gait;Impaired mobility;Impaired vision Impaired mobility -  Risk for fall due to (comments): - "  problems with legs" -  Follow up Falls prevention discussed;Education provided - -   PHQ 2/9 Scores 07/13/2016  PHQ - 2 Score 0    Assessment:   Patient is currently residing in large, apartment with his dauhther, son in Sports coach and ex-wife. Patient responded to assessment questions. Patient was agreeable to [facility] LCSW referral to assist with finding low income dental care. Patient is a retired Administrator for [employer].  Plan:  Telephone contact with patient in the next 21 days for assessment of progression towards meeting his case management goals.

## 2016-07-13 NOTE — Patient Outreach (Signed)
[facility]) Care Management   07/13/2016  MILEN LENGACHER 1945-07-12 [id number]  ZORIAN GUNDERMAN is an 71 y.o. male  Subjective:   Objective:   ROS  Physical Exam  Encounter Medications:   Outpatient Encounter Prescriptions as of 07/13/2016  Medication Sig Note  . acetaminophen (TYLENOL) 500 MG tablet Take 1,000 mg by mouth every 6 (six) hours as needed for fever or headache (pain).   [name] Kitchen amLODipine (NORVASC) 5 MG tablet Take 2.5 mg by mouth daily.   [name] Kitchen aspirin 81 MG EC tablet Take 1 tablet (81 mg total) by mouth daily.   . carbamide peroxide (DEBROX) 6.5 % otic solution Place 5 drops into the left ear 2 (two) times daily.   . ciprofloxacin (CIPRO) 500 MG tablet Take 1 tablet (500 mg total) by mouth 2 (two) times daily. (Patient not taking: Reported on 07/13/2016)   . clopidogrel (PLAVIX) 75 MG tablet TAKE 1 TABLET (75 MG TOTAL) BY MOUTH DAILY WITH BREAKFAST.   . ferrous sulfate 325 (65 FE) MG tablet Take 1 tablet (325 mg total) by mouth 2 (two) times daily with a meal.   . gabapentin (NEURONTIN) 300 MG capsule Take 300 mg by mouth 2 (two) times daily. Reported on 02/25/2015   . Insulin Glargine (BASAGLAR KWIKPEN) 100 UNIT/ML SOPN Inject 20 Units into the skin 2 (two) times daily.  06/05/2016: Dosage verified by the patient's daughter  . metoprolol (LOPRESSOR) 50 MG tablet Take 1 tablet (50 mg total) by mouth 2 (two) times daily. 06/30/2016: Ran out of tablets about a week ago - blood pressure has been running low so daughter is not sure if this will be continued.  . polyvinyl alcohol (ARTIFICIAL TEARS) 1.4 % ophthalmic solution Place 1 drop into both eyes daily as needed for dry eyes.   . rosuvastatin (CRESTOR) 5 MG tablet Take 5 mg by mouth at bedtime.   . sertraline (ZOLOFT) 25 MG tablet Take 1 tablet (25 mg total) by mouth daily.   . Vitamin D, Ergocalciferol, (DRISDOL) 50000 units CAPS capsule Take 50,000 Units by mouth every Saturday.    No facility-administered  encounter medications on file as of 07/13/2016.     Functional Status:   In your present state of health, do you have any difficulty performing the following activities: 07/13/2016 06/30/2016  Hearing? N N  Vision? Y Y  Difficulty concentrating or making decisions? N N  Walking or climbing stairs? Y Y  Dressing or bathing? N N  Doing errands, shopping? Y N  Preparing Food and eating ? Y -  Using the Toilet? N -  In the past six months, have you accidently leaked urine? N -  Do you have problems with loss of bowel control? N -  Managing your Medications? N -  Managing your Finances? Y -  Housekeeping or managing your Housekeeping? Y -  Some recent data might be hidden    Fall/Depression Screening:    Fall Risk  07/13/2016 11/11/2015 10/31/2015  Falls in the past year? Yes Yes Yes  Number falls in past yr: 1 2 or more 2 or more  Injury with Fall? No No No  Risk for fall due to : History of fall(s);Impaired balance/gait;Impaired mobility;Impaired vision Impaired mobility -  Risk for fall due to (comments): - "problems with legs" -  Follow up Falls prevention discussed;Education provided - -   PHQ 2/9 Scores 07/13/2016  PHQ - 2 Score 0    Assessment:    Plan:

## 2016-07-16 DIAGNOSIS — I161 Hypertensive emergency: Secondary | ICD-10-CM | POA: Diagnosis not present

## 2016-07-16 DIAGNOSIS — I129 Hypertensive chronic kidney disease with stage 1 through stage 4 chronic kidney disease, or unspecified chronic kidney disease: Secondary | ICD-10-CM | POA: Diagnosis not present

## 2016-07-16 DIAGNOSIS — G9340 Encephalopathy, unspecified: Secondary | ICD-10-CM | POA: Diagnosis not present

## 2016-07-16 DIAGNOSIS — E1122 Type 2 diabetes mellitus with diabetic chronic kidney disease: Secondary | ICD-10-CM | POA: Diagnosis not present

## 2016-07-16 DIAGNOSIS — N183 Chronic kidney disease, stage 3 (moderate): Secondary | ICD-10-CM | POA: Diagnosis not present

## 2016-07-16 DIAGNOSIS — E1142 Type 2 diabetes mellitus with diabetic polyneuropathy: Secondary | ICD-10-CM | POA: Diagnosis not present

## 2016-07-18 ENCOUNTER — Other Ambulatory Visit: Payer: Self-pay | Admitting: Licensed Clinical Social Worker

## 2016-07-18 NOTE — Patient Outreach (Signed)
[facility]) Care Management  07/18/2016  Harry [name] March 24, 1945 [id number]  Assessment- CSW received new referral on patient. Patient is in need of dental resource information. CSW completed outreach call to patient and he answered. HIPPA verifications received. Patient shares that he is interested in dental resources but did know if there were any available resources locally. CSW reviewed dental resources with patient which includes: [facility], [facility], [facility], [place], [place] ([place]), DSS and [employer]. CSW reviewed each resource with patient. Patient appreciative of this information and is agreeable to this CSW mailing him out resource information in regards to dental assistance. Patient denies any further social work needs at this time and understands that CSW will not open case. However, patient is agreeable to keeping this CSW's number in case he has any other social work related concerns in the future.  Plan-CSW will send request to [place] Management Assistant to mail out dental resources. CSW will not open case and will update [facility] RNCM.  Eula Fried, BSW, MSW, [email or web address] Phone: [phone] Fax: [phone]

## 2016-07-18 NOTE — Patient Outreach (Signed)
[facility]) Care Management  07/18/2016  NOVA EVETT Mar 01, 1946 [id number]   Request received from Eula Fried requesting Dental Resource to be sent patient.  This was sent on 07/18/16.

## 2016-07-20 ENCOUNTER — Other Ambulatory Visit: Payer: Self-pay

## 2016-07-20 DIAGNOSIS — I129 Hypertensive chronic kidney disease with stage 1 through stage 4 chronic kidney disease, or unspecified chronic kidney disease: Secondary | ICD-10-CM | POA: Diagnosis not present

## 2016-07-20 DIAGNOSIS — I161 Hypertensive emergency: Secondary | ICD-10-CM | POA: Diagnosis not present

## 2016-07-20 DIAGNOSIS — E1142 Type 2 diabetes mellitus with diabetic polyneuropathy: Secondary | ICD-10-CM | POA: Diagnosis not present

## 2016-07-20 DIAGNOSIS — E1122 Type 2 diabetes mellitus with diabetic chronic kidney disease: Secondary | ICD-10-CM | POA: Diagnosis not present

## 2016-07-20 DIAGNOSIS — G9340 Encephalopathy, unspecified: Secondary | ICD-10-CM | POA: Diagnosis not present

## 2016-07-20 DIAGNOSIS — N183 Chronic kidney disease, stage 3 (moderate): Secondary | ICD-10-CM | POA: Diagnosis not present

## 2016-07-20 NOTE — Patient Outreach (Signed)
[facility]) Care Management  07/20/2016   Harry [name] [birth year] [id number]  Subjective:  I have been to all my appointments. I had a call from the social worker, she is sending me some information.  Objective:  Telephonic encounter   Current Medications:  Current Outpatient Prescriptions  Medication Sig Dispense Refill  . acetaminophen (TYLENOL) 500 MG tablet Take 1,000 mg by mouth every 6 (six) hours as needed for fever or headache (pain).    [name] Kitchen amLODipine (NORVASC) 5 MG tablet Take 2.5 mg by mouth daily.    [name] Kitchen aspirin 81 MG EC tablet Take 1 tablet (81 mg total) by mouth daily.    . carbamide peroxide (DEBROX) 6.5 % otic solution Place 5 drops into the left ear 2 (two) times daily. 15 mL 0  . ciprofloxacin (CIPRO) 500 MG tablet Take 1 tablet (500 mg total) by mouth 2 (two) times daily. (Patient not taking: Reported on 07/13/2016) 10 tablet 0  . clopidogrel (PLAVIX) 75 MG tablet TAKE 1 TABLET (75 MG TOTAL) BY MOUTH DAILY WITH BREAKFAST. 90 tablet 2  . ferrous sulfate 325 (65 FE) MG tablet Take 1 tablet (325 mg total) by mouth 2 (two) times daily with a meal. 60 tablet 1  . gabapentin (NEURONTIN) 300 MG capsule Take 300 mg by mouth 2 (two) times daily. Reported on 02/25/2015  1  . Insulin Glargine (BASAGLAR KWIKPEN) 100 UNIT/ML SOPN Inject 20 Units into the skin 2 (two) times daily.     . metoprolol (LOPRESSOR) 50 MG tablet Take 1 tablet (50 mg total) by mouth 2 (two) times daily. 60 tablet 0  . polyvinyl alcohol (ARTIFICIAL TEARS) 1.4 % ophthalmic solution Place 1 drop into both eyes daily as needed for dry eyes.    . rosuvastatin (CRESTOR) 5 MG tablet Take 5 mg by mouth at bedtime.    . sertraline (ZOLOFT) 25 MG tablet Take 1 tablet (25 mg total) by mouth daily. 30 tablet 12  . Vitamin D, Ergocalciferol, (DRISDOL) 50000 units CAPS capsule Take 50,000 Units by mouth every Saturday.     No current facility-administered medications for this visit.     Functional  Status:  In your present state of health, do you have any difficulty performing the following activities: 07/13/2016 06/30/2016  Hearing? N N  Vision? Y Y  Difficulty concentrating or making decisions? N N  Walking or climbing stairs? Y Y  Dressing or bathing? N N  Doing errands, shopping? Y N  Preparing Food and eating ? Y -  Using the Toilet? N -  In the past six months, have you accidently leaked urine? N -  Do you have problems with loss of bowel control? N -  Managing your Medications? N -  Managing your Finances? Y -  Housekeeping or managing your Housekeeping? Y -  Some recent data might be hidden    Fall/Depression Screening: Fall Risk  07/13/2016 11/11/2015 10/31/2015  Falls in the past year? Yes Yes Yes  Number falls in past yr: 1 2 or more 2 or more  Injury with Fall? No No No  Risk for fall due to : History of fall(s);Impaired balance/gait;Impaired mobility;Impaired vision Impaired mobility -  Risk for fall due to (comments): - "problems with legs" -  Follow up Falls prevention discussed;Education provided - -   PHQ 2/9 Scores 07/13/2016  PHQ - 2 Score 0   [facility] CM Care Plan Problem One     Most Recent Value  Care Plan Problem  One  Patient was recently discharged from the acute care setting  Role Documenting the Problem One  Care Management [place] for Problem One  Active  [facility] Long Term Goal (31-90 days)  Patient will have no acute care admission in the next 31 days  [facility] Long Term Goal Start Date  07/13/16  Interventions for Problem One Long Term Goal  telephone assessment to assess patient's  progression in reaching his case management goals.  Patient denies acute care admissions since our last home visit  [facility] CM Short Term Goal #1 (0-30 days)  In the next 28 days, patient will have 100% attendance with medical and medicsl disciple appointmnets  [facility] CM Short Term Goal #1 Start Date  07/13/16  Interventions for Short Term Goal #1  patient reports he has been  in attendance to all his medical appointments since leaving the hospital    [facility] CM Care Plan Problem Two     Most Recent Value  Care Plan Problem Two  Patient needs dental care  Role Documenting the Problem Two  Care Management [place] for Problem Two  Active  [facility] CM Short Term Goal #1 (0-30 days)  Patient will meet with [facility] LCSW to assess community resources for affordable dental care   [facility] CM Short Term Goal #1 Start Date  07/13/16  [facility] CM Short Term Goal #1 Met Date   07/20/16  Interventions for Short Term Goal #2   patient reports having received call from [facility] LCSW advising him on how to access low cost dental care.      Assessment:  Patient reports feeling better, able to move around more. Patient refuses referral to [facility] or Palliative Care at this time. Patient states he is getting very good support from his ex-wife, daughter and son in law.  Plan:  Telephone contact in the next 21 days to assess patient's progress in meeting his case management goals

## 2016-07-24 DIAGNOSIS — H0231 Blepharochalasis right upper eyelid: Secondary | ICD-10-CM | POA: Diagnosis not present

## 2016-07-24 DIAGNOSIS — H3341 Traction detachment of retina, right eye: Secondary | ICD-10-CM | POA: Diagnosis not present

## 2016-07-24 DIAGNOSIS — E113591 Type 2 diabetes mellitus with proliferative diabetic retinopathy without macular edema, right eye: Secondary | ICD-10-CM | POA: Diagnosis not present

## 2016-07-24 DIAGNOSIS — H4311 Vitreous hemorrhage, right eye: Secondary | ICD-10-CM | POA: Diagnosis not present

## 2016-07-24 DIAGNOSIS — H40001 Preglaucoma, unspecified, right eye: Secondary | ICD-10-CM | POA: Diagnosis not present

## 2016-07-24 DIAGNOSIS — Z961 Presence of intraocular lens: Secondary | ICD-10-CM | POA: Diagnosis not present

## 2016-07-24 DIAGNOSIS — Z9841 Cataract extraction status, right eye: Secondary | ICD-10-CM | POA: Diagnosis not present

## 2016-07-24 DIAGNOSIS — Z9842 Cataract extraction status, left eye: Secondary | ICD-10-CM | POA: Diagnosis not present

## 2016-07-24 DIAGNOSIS — H02402 Unspecified ptosis of left eyelid: Secondary | ICD-10-CM | POA: Diagnosis not present

## 2016-07-24 DIAGNOSIS — Z794 Long term (current) use of insulin: Secondary | ICD-10-CM | POA: Diagnosis not present

## 2016-07-26 DIAGNOSIS — G9340 Encephalopathy, unspecified: Secondary | ICD-10-CM | POA: Diagnosis not present

## 2016-07-26 DIAGNOSIS — I161 Hypertensive emergency: Secondary | ICD-10-CM | POA: Diagnosis not present

## 2016-07-26 DIAGNOSIS — E1122 Type 2 diabetes mellitus with diabetic chronic kidney disease: Secondary | ICD-10-CM | POA: Diagnosis not present

## 2016-07-26 DIAGNOSIS — E1142 Type 2 diabetes mellitus with diabetic polyneuropathy: Secondary | ICD-10-CM | POA: Diagnosis not present

## 2016-07-26 DIAGNOSIS — N183 Chronic kidney disease, stage 3 (moderate): Secondary | ICD-10-CM | POA: Diagnosis not present

## 2016-07-26 DIAGNOSIS — I129 Hypertensive chronic kidney disease with stage 1 through stage 4 chronic kidney disease, or unspecified chronic kidney disease: Secondary | ICD-10-CM | POA: Diagnosis not present

## 2016-07-27 ENCOUNTER — Ambulatory Visit: Payer: Medicare Other

## 2016-08-08 ENCOUNTER — Other Ambulatory Visit: Payer: Self-pay | Admitting: Licensed Clinical Social Worker

## 2016-08-08 NOTE — Patient Outreach (Signed)
[place] [facility]) Care Management  08/08/2016  HARRINGTON JOBE 04/18/1945 [id number]  Assessment- CSW mailed dental resources to patient per patient's request on 07/18/16. However, resource packet was returned to sender and was not able to be mailed successfully. CSW sent in basket message to [place] who is still involved with this update. CSW sent secure email to [place] in order for her to provide resources at next home visit.  Plan-CSW will not open case.  Eula Fried, BSW, MSW, [email or web address] Phone: [phone] Fax: [phone]

## 2016-08-16 ENCOUNTER — Emergency Department (HOSPITAL_COMMUNITY): Payer: Medicare Other

## 2016-08-16 ENCOUNTER — Emergency Department (HOSPITAL_COMMUNITY)
Admission: EM | Admit: 2016-08-16 | Discharge: 2016-08-16 | Disposition: A | Discharge status: Home / Self Care | Payer: Medicare Other | Attending: Emergency Medicine | Admitting: Emergency Medicine

## 2016-08-16 ENCOUNTER — Encounter (HOSPITAL_COMMUNITY): Payer: Self-pay

## 2016-08-16 DIAGNOSIS — E1122 Type 2 diabetes mellitus with diabetic chronic kidney disease: Secondary | ICD-10-CM | POA: Diagnosis not present

## 2016-08-16 DIAGNOSIS — Z79899 Other long term (current) drug therapy: Secondary | ICD-10-CM | POA: Diagnosis not present

## 2016-08-16 DIAGNOSIS — N183 Chronic kidney disease, stage 3 (moderate): Secondary | ICD-10-CM | POA: Insufficient documentation

## 2016-08-16 DIAGNOSIS — Z794 Long term (current) use of insulin: Secondary | ICD-10-CM | POA: Insufficient documentation

## 2016-08-16 DIAGNOSIS — Z7982 Long term (current) use of aspirin: Secondary | ICD-10-CM | POA: Diagnosis not present

## 2016-08-16 DIAGNOSIS — R402421 Glasgow coma scale score 9-12, in the field [EMT or ambulance]: Secondary | ICD-10-CM | POA: Diagnosis not present

## 2016-08-16 DIAGNOSIS — I129 Hypertensive chronic kidney disease with stage 1 through stage 4 chronic kidney disease, or unspecified chronic kidney disease: Secondary | ICD-10-CM | POA: Insufficient documentation

## 2016-08-16 DIAGNOSIS — R55 Syncope and collapse: Secondary | ICD-10-CM | POA: Insufficient documentation

## 2016-08-16 DIAGNOSIS — F17210 Nicotine dependence, cigarettes, uncomplicated: Secondary | ICD-10-CM | POA: Insufficient documentation

## 2016-08-16 DIAGNOSIS — Z0389 Encounter for observation for other suspected diseases and conditions ruled out: Secondary | ICD-10-CM | POA: Diagnosis not present

## 2016-08-16 LAB — CBC WITH DIFFERENTIAL/PLATELET
Basophils Absolute: 0 10*3/uL (ref 0.0–0.1)
Basophils Relative: 0 %
Eosinophils Absolute: 0.2 10*3/uL (ref 0.0–0.7)
Eosinophils Relative: 3 %
HCT: 31.2 % — ABNORMAL LOW (ref 39.0–52.0)
Hemoglobin: 10.2 g/dL — ABNORMAL LOW (ref 13.0–17.0)
Lymphocytes Relative: 25 %
Lymphs Abs: 1.3 10*3/uL (ref 0.7–4.0)
MCH: 31.4 pg (ref 26.0–34.0)
MCHC: 32.7 g/dL (ref 30.0–36.0)
MCV: 96 fL (ref 78.0–100.0)
Monocytes Absolute: 0.7 10*3/uL (ref 0.1–1.0)
Monocytes Relative: 13 %
Neutro Abs: 3 10*3/uL (ref 1.7–7.7)
Neutrophils Relative %: 59 %
Platelets: 243 10*3/uL (ref 150–400)
RBC: 3.25 MIL/uL — ABNORMAL LOW (ref 4.22–5.81)
RDW: 14.1 % (ref 11.5–15.5)
WBC: 5.2 10*3/uL (ref 4.0–10.5)

## 2016-08-16 LAB — COMPREHENSIVE METABOLIC PANEL
ALT: 23 U/L (ref 17–63)
AST: 23 U/L (ref 15–41)
Albumin: 3.6 g/dL (ref 3.5–5.0)
Alkaline Phosphatase: 55 U/L (ref 38–126)
Anion gap: 9 (ref 5–15)
BUN: 34 mg/dL — ABNORMAL HIGH (ref 6–20)
CO2: 23 mmol/L (ref 22–32)
Calcium: 8.7 mg/dL — ABNORMAL LOW (ref 8.9–10.3)
Chloride: 107 mmol/L (ref 101–111)
Creatinine, Ser: 2.12 mg/dL — ABNORMAL HIGH (ref 0.61–1.24)
GFR calc Af Amer: 35 mL/min — ABNORMAL LOW (ref >60)
GFR calc non Af Amer: 30 mL/min — ABNORMAL LOW (ref >60)
Glucose, Bld: 123 mg/dL — ABNORMAL HIGH (ref 65–99)
Potassium: 3.9 mmol/L (ref 3.5–5.1)
Sodium: 139 mmol/L (ref 135–145)
Total Bilirubin: 1 mg/dL (ref 0.3–1.2)
Total Protein: 7.2 g/dL (ref 6.5–8.1)

## 2016-08-16 LAB — CBG MONITORING, ED: Glucose-Capillary: 128 mg/dL — ABNORMAL HIGH (ref 65–99)

## 2016-08-16 LAB — TROPONIN I: Troponin I: <0.03 ng/mL (ref <0.03)

## 2016-08-16 MED ORDER — SODIUM CHLORIDE 0.9 % IV BOLUS (SEPSIS)
1000.0000 mL | Freq: Once | INTRAVENOUS | Status: AC
  Administered 2016-08-16: 1000 mL via INTRAVENOUS

## 2016-08-16 MED ORDER — SODIUM CHLORIDE 0.9 % IV SOLN
INTRAVENOUS | Status: DC
  Administered 2016-08-16: 14:00:00 via INTRAVENOUS
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
## 2016-08-16 NOTE — Discharge Instructions (Signed)
As discussed, your evaluation today has been largely reassuring.  But, it is important that you monitor your condition carefully, and do not hesitate to return to the ED if you develop new, or concerning changes in your condition. ? ?Otherwise, please follow-up with your physician for appropriate ongoing care. ? ?

## 2016-08-16 NOTE — ED Notes (Signed)
Okay for pt to eat per Dr. Vanita Panda. Pt eating meal from restaurant

## 2016-08-16 NOTE — ED Triage Notes (Signed)
EMS reports pt was at lunch and became unresponsive.  Reports wife called ems at 1206.  FD told ems pt was unresponsive when they arrived.  EMS arrived pt was becoming more alert very slowly.  Reports is diabetic.  CBG was 121 with ems.  Pt had not eaten today but took a few sips of drink before passing out.  EMS reports bp 80/50's, hr 80's.  Pt resposive to voice but drowsy.  EDP at bedside.

## 2016-08-17 ENCOUNTER — Ambulatory Visit: Payer: Medicare Other

## 2016-08-20 ENCOUNTER — Other Ambulatory Visit: Payer: Self-pay

## 2016-08-20 NOTE — Patient Outreach (Signed)
   Unsuccessful attempt made to contact patient via telephone.  Plan: Make another attempt within the next 28 days for community care coordination.

## 2016-08-21 ENCOUNTER — Ambulatory Visit: Payer: Medicare Other | Admitting: Nurse Practitioner

## 2016-08-23 ENCOUNTER — Other Ambulatory Visit: Payer: Self-pay | Admitting: Cardiovascular Disease

## 2016-08-23 DIAGNOSIS — I10 Essential (primary) hypertension: Secondary | ICD-10-CM

## 2016-08-23 DIAGNOSIS — F17200 Nicotine dependence, unspecified, uncomplicated: Secondary | ICD-10-CM

## 2016-08-23 DIAGNOSIS — I739 Peripheral vascular disease, unspecified: Secondary | ICD-10-CM

## 2016-08-31 ENCOUNTER — Other Ambulatory Visit: Payer: Self-pay | Admitting: Cardiovascular Disease

## 2016-08-31 ENCOUNTER — Ambulatory Visit: Payer: Medicare Other

## 2016-08-31 NOTE — Telephone Encounter (Signed)
Rx(s) sent to pharmacy electronically.  

## 2016-09-07 ENCOUNTER — Ambulatory Visit (HOSPITAL_COMMUNITY)
Admission: RE | Admit: 2016-09-07 | Discharge: 2016-09-07 | Disposition: A | Discharge status: Home / Self Care | Payer: Medicare Other | Source: Ambulatory Visit | Attending: Cardiovascular Disease | Admitting: Cardiovascular Disease

## 2016-09-07 DIAGNOSIS — Z9862 Peripheral vascular angioplasty status: Secondary | ICD-10-CM | POA: Insufficient documentation

## 2016-09-07 DIAGNOSIS — I10 Essential (primary) hypertension: Secondary | ICD-10-CM

## 2016-09-07 DIAGNOSIS — Z79899 Other long term (current) drug therapy: Secondary | ICD-10-CM

## 2016-09-07 DIAGNOSIS — F17200 Nicotine dependence, unspecified, uncomplicated: Secondary | ICD-10-CM

## 2016-09-07 DIAGNOSIS — E785 Hyperlipidemia, unspecified: Secondary | ICD-10-CM

## 2016-09-07 DIAGNOSIS — E1151 Type 2 diabetes mellitus with diabetic peripheral angiopathy without gangrene: Secondary | ICD-10-CM | POA: Insufficient documentation

## 2016-09-07 DIAGNOSIS — I70202 Unspecified atherosclerosis of native arteries of extremities, left leg: Secondary | ICD-10-CM | POA: Diagnosis not present

## 2016-09-07 DIAGNOSIS — Z8673 Personal history of transient ischemic attack (TIA), and cerebral infarction without residual deficits: Secondary | ICD-10-CM | POA: Diagnosis not present

## 2016-09-07 DIAGNOSIS — I739 Peripheral vascular disease, unspecified: Secondary | ICD-10-CM

## 2016-09-11 DIAGNOSIS — H40011 Open angle with borderline findings, low risk, right eye: Secondary | ICD-10-CM | POA: Diagnosis not present

## 2016-09-11 DIAGNOSIS — E113591 Type 2 diabetes mellitus with proliferative diabetic retinopathy without macular edema, right eye: Secondary | ICD-10-CM | POA: Diagnosis not present

## 2016-09-19 ENCOUNTER — Inpatient Hospital Stay (HOSPITAL_COMMUNITY)
Admission: EM | Admit: 2016-09-19 | Discharge: 2016-09-21 | DRG: 682 | Disposition: A | Discharge status: Home Health | Payer: Medicare Other | Attending: Internal Medicine | Admitting: Internal Medicine

## 2016-09-19 ENCOUNTER — Emergency Department (HOSPITAL_COMMUNITY): Payer: Medicare Other

## 2016-09-19 ENCOUNTER — Encounter (HOSPITAL_COMMUNITY): Payer: Self-pay | Admitting: Emergency Medicine

## 2016-09-19 DIAGNOSIS — R4182 Altered mental status, unspecified: Secondary | ICD-10-CM | POA: Diagnosis present

## 2016-09-19 DIAGNOSIS — I10 Essential (primary) hypertension: Secondary | ICD-10-CM | POA: Diagnosis not present

## 2016-09-19 DIAGNOSIS — E278 Other specified disorders of adrenal gland: Secondary | ICD-10-CM | POA: Diagnosis present

## 2016-09-19 DIAGNOSIS — E860 Dehydration: Secondary | ICD-10-CM | POA: Diagnosis not present

## 2016-09-19 DIAGNOSIS — I129 Hypertensive chronic kidney disease with stage 1 through stage 4 chronic kidney disease, or unspecified chronic kidney disease: Secondary | ICD-10-CM | POA: Diagnosis not present

## 2016-09-19 DIAGNOSIS — G3183 Dementia with Lewy bodies: Secondary | ICD-10-CM | POA: Diagnosis present

## 2016-09-19 DIAGNOSIS — D631 Anemia in chronic kidney disease: Secondary | ICD-10-CM | POA: Diagnosis present

## 2016-09-19 DIAGNOSIS — Z888 Allergy status to other drugs, medicaments and biological substances status: Secondary | ICD-10-CM

## 2016-09-19 DIAGNOSIS — N179 Acute kidney failure, unspecified: Secondary | ICD-10-CM | POA: Diagnosis not present

## 2016-09-19 DIAGNOSIS — E1122 Type 2 diabetes mellitus with diabetic chronic kidney disease: Secondary | ICD-10-CM | POA: Diagnosis not present

## 2016-09-19 DIAGNOSIS — D649 Anemia, unspecified: Secondary | ICD-10-CM | POA: Diagnosis not present

## 2016-09-19 DIAGNOSIS — Z8673 Personal history of transient ischemic attack (TIA), and cerebral infarction without residual deficits: Secondary | ICD-10-CM

## 2016-09-19 DIAGNOSIS — Z7982 Long term (current) use of aspirin: Secondary | ICD-10-CM

## 2016-09-19 DIAGNOSIS — S70322A Blister (nonthermal), left thigh, initial encounter: Secondary | ICD-10-CM | POA: Diagnosis present

## 2016-09-19 DIAGNOSIS — E785 Hyperlipidemia, unspecified: Secondary | ICD-10-CM | POA: Diagnosis present

## 2016-09-19 DIAGNOSIS — R404 Transient alteration of awareness: Secondary | ICD-10-CM | POA: Diagnosis not present

## 2016-09-19 DIAGNOSIS — R55 Syncope and collapse: Secondary | ICD-10-CM | POA: Diagnosis not present

## 2016-09-19 DIAGNOSIS — N183 Chronic kidney disease, stage 3 unspecified: Secondary | ICD-10-CM

## 2016-09-19 DIAGNOSIS — Z833 Family history of diabetes mellitus: Secondary | ICD-10-CM

## 2016-09-19 DIAGNOSIS — F0280 Dementia in other diseases classified elsewhere without behavioral disturbance: Secondary | ICD-10-CM | POA: Diagnosis present

## 2016-09-19 DIAGNOSIS — E1151 Type 2 diabetes mellitus with diabetic peripheral angiopathy without gangrene: Secondary | ICD-10-CM | POA: Diagnosis present

## 2016-09-19 DIAGNOSIS — Z7902 Long term (current) use of antithrombotics/antiplatelets: Secondary | ICD-10-CM

## 2016-09-19 DIAGNOSIS — R410 Disorientation, unspecified: Secondary | ICD-10-CM

## 2016-09-19 DIAGNOSIS — G9341 Metabolic encephalopathy: Secondary | ICD-10-CM | POA: Diagnosis present

## 2016-09-19 DIAGNOSIS — E11649 Type 2 diabetes mellitus with hypoglycemia without coma: Secondary | ICD-10-CM | POA: Diagnosis not present

## 2016-09-19 DIAGNOSIS — F329 Major depressive disorder, single episode, unspecified: Secondary | ICD-10-CM | POA: Diagnosis present

## 2016-09-19 DIAGNOSIS — N1831 Chronic kidney disease, stage 3a: Secondary | ICD-10-CM | POA: Diagnosis present

## 2016-09-19 DIAGNOSIS — Z79899 Other long term (current) drug therapy: Secondary | ICD-10-CM

## 2016-09-19 DIAGNOSIS — E1142 Type 2 diabetes mellitus with diabetic polyneuropathy: Secondary | ICD-10-CM | POA: Diagnosis present

## 2016-09-19 DIAGNOSIS — Z8249 Family history of ischemic heart disease and other diseases of the circulatory system: Secondary | ICD-10-CM

## 2016-09-19 DIAGNOSIS — N189 Chronic kidney disease, unspecified: Secondary | ICD-10-CM | POA: Diagnosis present

## 2016-09-19 DIAGNOSIS — E7800 Pure hypercholesterolemia, unspecified: Secondary | ICD-10-CM | POA: Diagnosis present

## 2016-09-19 DIAGNOSIS — H5462 Unqualified visual loss, left eye, normal vision right eye: Secondary | ICD-10-CM | POA: Diagnosis present

## 2016-09-19 DIAGNOSIS — E1169 Type 2 diabetes mellitus with other specified complication: Secondary | ICD-10-CM

## 2016-09-19 DIAGNOSIS — X32XXXA Exposure to sunlight, initial encounter: Secondary | ICD-10-CM

## 2016-09-19 DIAGNOSIS — Z794 Long term (current) use of insulin: Secondary | ICD-10-CM

## 2016-09-19 DIAGNOSIS — S70321A Blister (nonthermal), right thigh, initial encounter: Secondary | ICD-10-CM | POA: Diagnosis present

## 2016-09-19 LAB — COMPREHENSIVE METABOLIC PANEL
ALT: 19 U/L (ref 17–63)
AST: 27 U/L (ref 15–41)
Albumin: 3.9 g/dL (ref 3.5–5.0)
Alkaline Phosphatase: 57 U/L (ref 38–126)
Anion gap: 9 (ref 5–15)
BUN: 31 mg/dL — ABNORMAL HIGH (ref 6–20)
CO2: 22 mmol/L (ref 22–32)
Calcium: 9.1 mg/dL (ref 8.9–10.3)
Chloride: 109 mmol/L (ref 101–111)
Creatinine, Ser: 2.82 mg/dL — ABNORMAL HIGH (ref 0.61–1.24)
GFR calc Af Amer: 25 mL/min — ABNORMAL LOW (ref >60)
GFR calc non Af Amer: 21 mL/min — ABNORMAL LOW (ref >60)
Glucose, Bld: 86 mg/dL (ref 65–99)
Potassium: 4.5 mmol/L (ref 3.5–5.1)
Sodium: 140 mmol/L (ref 135–145)
Total Bilirubin: 1.6 mg/dL — ABNORMAL HIGH (ref 0.3–1.2)
Total Protein: 7.1 g/dL (ref 6.5–8.1)

## 2016-09-19 LAB — CBC WITH DIFFERENTIAL/PLATELET
Basophils Absolute: 0 10*3/uL (ref 0.0–0.1)
Basophils Relative: 0 %
Eosinophils Absolute: 0.2 10*3/uL (ref 0.0–0.7)
Eosinophils Relative: 3 %
HCT: 36.2 % — ABNORMAL LOW (ref 39.0–52.0)
Hemoglobin: 11.8 g/dL — ABNORMAL LOW (ref 13.0–17.0)
Lymphocytes Relative: 20 %
Lymphs Abs: 1.7 10*3/uL (ref 0.7–4.0)
MCH: 30.3 pg (ref 26.0–34.0)
MCHC: 32.6 g/dL (ref 30.0–36.0)
MCV: 92.8 fL (ref 78.0–100.0)
Monocytes Absolute: 0.8 10*3/uL (ref 0.1–1.0)
Monocytes Relative: 9 %
Neutro Abs: 5.8 10*3/uL (ref 1.7–7.7)
Neutrophils Relative %: 68 %
Platelets: 233 10*3/uL (ref 150–400)
RBC: 3.9 MIL/uL — ABNORMAL LOW (ref 4.22–5.81)
RDW: 14.1 % (ref 11.5–15.5)
WBC: 8.5 10*3/uL (ref 4.0–10.5)

## 2016-09-19 LAB — GLUCOSE, CAPILLARY: Glucose-Capillary: 90 mg/dL (ref 65–99)

## 2016-09-19 LAB — URINALYSIS, ROUTINE W REFLEX MICROSCOPIC
Bilirubin Urine: NEGATIVE
Glucose, UA: 150 mg/dL — AB
Ketones, ur: NEGATIVE mg/dL
Leukocytes, UA: NEGATIVE
Nitrite: NEGATIVE
Protein, ur: NEGATIVE mg/dL
Specific Gravity, Urine: 1.012 (ref 1.005–1.030)
pH: 5 (ref 5.0–8.0)

## 2016-09-19 LAB — CK: Total CK: 179 U/L (ref 49–397)

## 2016-09-19 LAB — TROPONIN I: Troponin I: <0.03 ng/mL (ref <0.03)

## 2016-09-19 MED ORDER — ASPIRIN EC 81 MG PO TBEC
81.0000 mg | DELAYED_RELEASE_TABLET | Freq: Every day | ORAL | Status: DC
  Administered 2016-09-20 – 2016-09-21 (×2): 81 mg via ORAL
  Filled 2016-09-19 (×2): qty 1

## 2016-09-19 MED ORDER — AMLODIPINE BESYLATE 2.5 MG PO TABS
2.5000 mg | ORAL_TABLET | Freq: Every day | ORAL | Status: DC
  Administered 2016-09-20 – 2016-09-21 (×2): 2.5 mg via ORAL
  Filled 2016-09-19 (×2): qty 1

## 2016-09-19 MED ORDER — ONDANSETRON HCL 4 MG/2ML IJ SOLN: 4.0000 mg | Freq: Four times a day (QID) | INTRAMUSCULAR | Status: DC | PRN

## 2016-09-19 MED ORDER — ONDANSETRON HCL 4 MG PO TABS: 4.0000 mg | ORAL_TABLET | Freq: Four times a day (QID) | ORAL | Status: DC | PRN

## 2016-09-19 MED ORDER — FERROUS SULFATE 325 (65 FE) MG PO TABS
325.0000 mg | ORAL_TABLET | Freq: Two times a day (BID) | ORAL | Status: DC
  Administered 2016-09-20 – 2016-09-21 (×3): 325 mg via ORAL
  Filled 2016-09-19 (×3): qty 1

## 2016-09-19 MED ORDER — CLOPIDOGREL BISULFATE 75 MG PO TABS
75.0000 mg | ORAL_TABLET | Freq: Every day | ORAL | Status: DC
  Administered 2016-09-20 – 2016-09-21 (×2): 75 mg via ORAL
  Filled 2016-09-19 (×2): qty 1

## 2016-09-19 MED ORDER — SODIUM CHLORIDE 0.9 % IV SOLN
INTRAVENOUS | Status: DC
  Administered 2016-09-19 – 2016-09-21 (×3): via INTRAVENOUS

## 2016-09-19 MED ORDER — GABAPENTIN 300 MG PO CAPS
300.0000 mg | ORAL_CAPSULE | Freq: Two times a day (BID) | ORAL | Status: DC
  Administered 2016-09-19 – 2016-09-21 (×4): 300 mg via ORAL
  Filled 2016-09-19 (×4): qty 1

## 2016-09-19 MED ORDER — ACETAMINOPHEN 650 MG RE SUPP: 650.0000 mg | Freq: Four times a day (QID) | RECTAL | Status: DC | PRN

## 2016-09-19 MED ORDER — ROSUVASTATIN CALCIUM 5 MG PO TABS
5.0000 mg | ORAL_TABLET | Freq: Every day | ORAL | Status: DC
  Administered 2016-09-20: 5 mg via ORAL
  Filled 2016-09-19: qty 1

## 2016-09-19 MED ORDER — ACETAMINOPHEN 325 MG PO TABS
650.0000 mg | ORAL_TABLET | Freq: Four times a day (QID) | ORAL | Status: DC | PRN
  Administered 2016-09-20: 650 mg via ORAL
  Filled 2016-09-19: qty 2

## 2016-09-19 MED ORDER — INSULIN ASPART 100 UNIT/ML ~~LOC~~ SOLN
0.0000 [IU] | Freq: Three times a day (TID) | SUBCUTANEOUS | Status: DC
  Administered 2016-09-20 – 2016-09-21 (×2): 2 [IU] via SUBCUTANEOUS

## 2016-09-19 MED ORDER — SODIUM CHLORIDE 0.9 % IV BOLUS (SEPSIS)
1000.0000 mL | Freq: Once | INTRAVENOUS | Status: AC
  Administered 2016-09-19: 1000 mL via INTRAVENOUS

## 2016-09-19 MED ORDER — SERTRALINE HCL 25 MG PO TABS
25.0000 mg | ORAL_TABLET | Freq: Every day | ORAL | Status: DC
  Administered 2016-09-20 – 2016-09-21 (×2): 25 mg via ORAL
  Filled 2016-09-19 (×2): qty 1

## 2016-09-19 MED ORDER — INSULIN ASPART 100 UNIT/ML ~~LOC~~ SOLN
0.0000 [IU] | Freq: Every day | SUBCUTANEOUS | Status: DC
  Administered 2016-09-20: 2 [IU] via SUBCUTANEOUS

## 2016-09-19 MED ORDER — POLYVINYL ALCOHOL 1.4 % OP SOLN: 1.0000 [drp] | Freq: Two times a day (BID) | OPHTHALMIC | Status: DC | PRN

## 2016-09-19 MED ORDER — METOPROLOL TARTRATE 50 MG PO TABS
50.0000 mg | ORAL_TABLET | Freq: Two times a day (BID) | ORAL | Status: DC
  Administered 2016-09-19 – 2016-09-21 (×4): 50 mg via ORAL
  Filled 2016-09-19 (×4): qty 1

## 2016-09-19 MED ORDER — ENOXAPARIN SODIUM 30 MG/0.3ML ~~LOC~~ SOLN
30.0000 mg | SUBCUTANEOUS | Status: DC
  Administered 2016-09-20: 30 mg via SUBCUTANEOUS
  Filled 2016-09-19: qty 0.3

## 2016-09-19 MED ORDER — INSULIN GLARGINE 100 UNIT/ML ~~LOC~~ SOLN
10.0000 [IU] | Freq: Two times a day (BID) | SUBCUTANEOUS | Status: DC
  Administered 2016-09-19 – 2016-09-20 (×3): 10 [IU] via SUBCUTANEOUS
  Filled 2016-09-19 (×4): qty 0.1
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
## 2016-09-19 NOTE — ED Notes (Signed)
Daughter, [phone]
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
## 2016-09-20 ENCOUNTER — Observation Stay (HOSPITAL_COMMUNITY): Payer: Medicare Other

## 2016-09-20 DIAGNOSIS — Z7982 Long term (current) use of aspirin: Secondary | ICD-10-CM | POA: Diagnosis not present

## 2016-09-20 DIAGNOSIS — E1142 Type 2 diabetes mellitus with diabetic polyneuropathy: Secondary | ICD-10-CM | POA: Diagnosis present

## 2016-09-20 DIAGNOSIS — N183 Chronic kidney disease, stage 3 (moderate): Secondary | ICD-10-CM

## 2016-09-20 DIAGNOSIS — N179 Acute kidney failure, unspecified: Secondary | ICD-10-CM | POA: Diagnosis present

## 2016-09-20 DIAGNOSIS — F0280 Dementia in other diseases classified elsewhere without behavioral disturbance: Secondary | ICD-10-CM | POA: Diagnosis present

## 2016-09-20 DIAGNOSIS — E1122 Type 2 diabetes mellitus with diabetic chronic kidney disease: Secondary | ICD-10-CM | POA: Diagnosis present

## 2016-09-20 DIAGNOSIS — R55 Syncope and collapse: Secondary | ICD-10-CM | POA: Diagnosis present

## 2016-09-20 DIAGNOSIS — Z7902 Long term (current) use of antithrombotics/antiplatelets: Secondary | ICD-10-CM | POA: Diagnosis not present

## 2016-09-20 DIAGNOSIS — E1151 Type 2 diabetes mellitus with diabetic peripheral angiopathy without gangrene: Secondary | ICD-10-CM | POA: Diagnosis present

## 2016-09-20 DIAGNOSIS — E11649 Type 2 diabetes mellitus with hypoglycemia without coma: Secondary | ICD-10-CM | POA: Diagnosis not present

## 2016-09-20 DIAGNOSIS — E278 Other specified disorders of adrenal gland: Secondary | ICD-10-CM | POA: Diagnosis present

## 2016-09-20 DIAGNOSIS — E860 Dehydration: Secondary | ICD-10-CM

## 2016-09-20 DIAGNOSIS — G9341 Metabolic encephalopathy: Secondary | ICD-10-CM | POA: Diagnosis present

## 2016-09-20 DIAGNOSIS — I129 Hypertensive chronic kidney disease with stage 1 through stage 4 chronic kidney disease, or unspecified chronic kidney disease: Secondary | ICD-10-CM | POA: Diagnosis present

## 2016-09-20 DIAGNOSIS — E785 Hyperlipidemia, unspecified: Secondary | ICD-10-CM | POA: Diagnosis present

## 2016-09-20 DIAGNOSIS — F329 Major depressive disorder, single episode, unspecified: Secondary | ICD-10-CM | POA: Diagnosis present

## 2016-09-20 DIAGNOSIS — Z794 Long term (current) use of insulin: Secondary | ICD-10-CM | POA: Diagnosis not present

## 2016-09-20 DIAGNOSIS — G3183 Dementia with Lewy bodies: Secondary | ICD-10-CM | POA: Diagnosis present

## 2016-09-20 DIAGNOSIS — X32XXXA Exposure to sunlight, initial encounter: Secondary | ICD-10-CM | POA: Diagnosis not present

## 2016-09-20 DIAGNOSIS — S70321A Blister (nonthermal), right thigh, initial encounter: Secondary | ICD-10-CM | POA: Diagnosis present

## 2016-09-20 DIAGNOSIS — D631 Anemia in chronic kidney disease: Secondary | ICD-10-CM | POA: Diagnosis present

## 2016-09-20 DIAGNOSIS — Z79899 Other long term (current) drug therapy: Secondary | ICD-10-CM | POA: Diagnosis not present

## 2016-09-20 DIAGNOSIS — S70322A Blister (nonthermal), left thigh, initial encounter: Secondary | ICD-10-CM | POA: Diagnosis present

## 2016-09-20 DIAGNOSIS — H5462 Unqualified visual loss, left eye, normal vision right eye: Secondary | ICD-10-CM | POA: Diagnosis present

## 2016-09-20 DIAGNOSIS — E7800 Pure hypercholesterolemia, unspecified: Secondary | ICD-10-CM | POA: Diagnosis present

## 2016-09-20 HISTORY — DX: Acute kidney failure, unspecified: N17.9

## 2016-09-20 LAB — GLUCOSE, CAPILLARY
Glucose-Capillary: 59 mg/dL — ABNORMAL LOW (ref 65–99)
Glucose-Capillary: 50 mg/dL — ABNORMAL LOW (ref 65–99)
Glucose-Capillary: 121 mg/dL — ABNORMAL HIGH (ref 65–99)
Glucose-Capillary: 181 mg/dL — ABNORMAL HIGH (ref 65–99)
Glucose-Capillary: 63 mg/dL — ABNORMAL LOW (ref 65–99)
Glucose-Capillary: 119 mg/dL — ABNORMAL HIGH (ref 65–99)
Glucose-Capillary: 222 mg/dL — ABNORMAL HIGH (ref 65–99)

## 2016-09-20 LAB — CBC
HCT: 34.4 % — ABNORMAL LOW (ref 39.0–52.0)
Hemoglobin: 11.2 g/dL — ABNORMAL LOW (ref 13.0–17.0)
MCH: 30.4 pg (ref 26.0–34.0)
MCHC: 32.6 g/dL (ref 30.0–36.0)
MCV: 93.2 fL (ref 78.0–100.0)
Platelets: 219 10*3/uL (ref 150–400)
RBC: 3.69 MIL/uL — ABNORMAL LOW (ref 4.22–5.81)
RDW: 13.9 % (ref 11.5–15.5)
WBC: 7.7 10*3/uL (ref 4.0–10.5)

## 2016-09-20 LAB — BASIC METABOLIC PANEL
Anion gap: 5 (ref 5–15)
BUN: 29 mg/dL — ABNORMAL HIGH (ref 6–20)
CO2: 27 mmol/L (ref 22–32)
Calcium: 8.7 mg/dL — ABNORMAL LOW (ref 8.9–10.3)
Chloride: 112 mmol/L — ABNORMAL HIGH (ref 101–111)
Creatinine, Ser: 2.17 mg/dL — ABNORMAL HIGH (ref 0.61–1.24)
GFR calc Af Amer: 34 mL/min — ABNORMAL LOW (ref >60)
GFR calc non Af Amer: 29 mL/min — ABNORMAL LOW (ref >60)
Glucose, Bld: 92 mg/dL (ref 65–99)
Potassium: 3.8 mmol/L (ref 3.5–5.1)
Sodium: 144 mmol/L (ref 135–145)

## 2016-09-20 MED ORDER — HYDRALAZINE HCL 20 MG/ML IJ SOLN
10.0000 mg | INTRAMUSCULAR | Status: DC | PRN
  Administered 2016-09-20: 10 mg via INTRAVENOUS
  Filled 2016-09-20: qty 1
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
## 2016-09-20 NOTE — Progress Notes (Signed)
8:15 cbg 59- juice given 8:39 cbg 50- juice and breakfast given 9:34 cbg 121 11:57 cbg 181 1655 cbg 63, juice given  1749 cbg 119
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
## 2016-09-21 LAB — GLUCOSE, CAPILLARY
Glucose-Capillary: 54 mg/dL — ABNORMAL LOW (ref 65–99)
Glucose-Capillary: 64 mg/dL — ABNORMAL LOW (ref 65–99)
Glucose-Capillary: 139 mg/dL — ABNORMAL HIGH (ref 65–99)
Glucose-Capillary: 185 mg/dL — ABNORMAL HIGH (ref 65–99)

## 2016-09-21 LAB — CBC
HCT: 32.8 % — ABNORMAL LOW (ref 39.0–52.0)
Hemoglobin: 10.7 g/dL — ABNORMAL LOW (ref 13.0–17.0)
MCH: 30.1 pg (ref 26.0–34.0)
MCHC: 32.6 g/dL (ref 30.0–36.0)
MCV: 92.4 fL (ref 78.0–100.0)
Platelets: 215 10*3/uL (ref 150–400)
RBC: 3.55 MIL/uL — ABNORMAL LOW (ref 4.22–5.81)
RDW: 13.8 % (ref 11.5–15.5)
WBC: 4.8 10*3/uL (ref 4.0–10.5)

## 2016-09-21 LAB — BASIC METABOLIC PANEL
Anion gap: 5 (ref 5–15)
BUN: 22 mg/dL — ABNORMAL HIGH (ref 6–20)
CO2: 24 mmol/L (ref 22–32)
Calcium: 8.2 mg/dL — ABNORMAL LOW (ref 8.9–10.3)
Chloride: 111 mmol/L (ref 101–111)
Creatinine, Ser: 1.59 mg/dL — ABNORMAL HIGH (ref 0.61–1.24)
GFR calc Af Amer: 49 mL/min — ABNORMAL LOW (ref >60)
GFR calc non Af Amer: 43 mL/min — ABNORMAL LOW (ref >60)
Glucose, Bld: 65 mg/dL (ref 65–99)
Potassium: 3.5 mmol/L (ref 3.5–5.1)
Sodium: 140 mmol/L (ref 135–145)

## 2016-09-21 MED ORDER — ENOXAPARIN SODIUM 40 MG/0.4ML ~~LOC~~ SOLN
40.0000 mg | SUBCUTANEOUS | Status: DC
  Filled 2016-09-21: qty 0.4

## 2016-09-21 MED ORDER — DEXTROSE 50 % IV SOLN
INTRAVENOUS | Status: AC
  Administered 2016-09-21: 25 mL
  Filled 2016-09-21: qty 50

## 2016-09-21 MED ORDER — INSULIN GLARGINE 100 UNIT/ML ~~LOC~~ SOLN
5.0000 [IU] | Freq: Two times a day (BID) | SUBCUTANEOUS | Status: DC
  Administered 2016-09-21: 5 [IU] via SUBCUTANEOUS
  Filled 2016-09-21 (×2): qty 0.05

## 2016-09-21 NOTE — Evaluation (Signed)
Physical Therapy Evaluation Patient Details Name: Harry [name] MRN: [id number] DOB: 10-15-1945 Today's Date: 09/21/2016   History of Present Illness  Pt is a 71 y/o male admitted secondary to  heat exposure, dehydration leading to acute renal failure superimposed on CKD; blisters on BLE. PMH includes dementia, DM, HTN, CVA, SKD, L eye blindness, and tobacco abuse.   Clinical Impression  Pt admitted secondary to problem above with deficits below. PTA, pt reports he needed occasional assist with mobility and ADLs. Reports he is living with his daughter right not as his wife is in the hospital. Upon eval, pt with decreased cognition, decreased safety awareness, decreased balance, and weakness. Pt requiring min to min guard assist for steadying, and required continuous cues for safe use of RW. Pt wanting to go home, and will need HHPT. Has RW at home, and educated to use to increase safety. Will continue to follow acutely to maximize functional mobility independence and safety.     Follow Up Recommendations Home health PT;Supervision/Assistance - 24 hour    Equipment Recommendations  None recommended by PT    Recommendations for Other Services       Precautions / Restrictions Precautions Precautions: Fall Precaution Comments: Blisters on BLE; try not to burst Restrictions Weight Bearing Restrictions: No      Mobility  Bed Mobility Overal bed mobility: Needs Assistance Bed Mobility: Supine to Sit     Supine to sit: Supervision;HOB elevated     General bed mobility comments: Pt with LE off the bed upon entry. Supervision for remainder of supine>sit transfer.   Transfers Overall transfer level: Needs assistance Equipment used: Rolling walker (2 wheeled) Transfers: Sit to/from Stand Sit to Stand: Min assist         General transfer comment: Min A for steadying upon standing. Verbal cues for safe hand placement.   Ambulation/Gait Ambulation/Gait assistance: Min assist;Min  guard Ambulation Distance (Feet): 30 Feet Assistive device: Rolling walker (2 wheeled) Gait Pattern/deviations: Step-through pattern;Decreased stride length;Drifts right/left;Trunk flexed;Shuffle Gait velocity: Decreased Gait velocity interpretation: Below normal speed for age/gender General Gait Details: Slow, unsteady gait. Continuous cues required for proximity to device, upright posture, and increasing step height.   Stairs            Wheelchair Mobility    Modified Rankin (Stroke Patients Only)       Balance Overall balance assessment: Needs assistance Sitting-balance support: No upper extremity supported;Feet supported Sitting balance-Leahy Scale: Good     Standing balance support: Bilateral upper extremity supported;During functional activity Standing balance-Leahy Scale: Poor Standing balance comment: Reliant on UE support for balance                              Pertinent Vitals/Pain Pain Assessment: No/denies pain    Home Living Family/patient expects to be discharged to:: Private residence Living Arrangements: Spouse/significant other;Children (Currently living with daughter per pt report ) Available Help at Discharge: Family;Available 24 hours/day;Other (Comment) Type of Home: Apartment Home Access: Level entry;Elevator     Home Layout: One level Home Equipment: Walker - 2 wheels;Cane - single point;Bedside commode;Tub bench      Prior Function Level of Independence: Needs assistance   Gait / Transfers Assistance Needed: Uses a walker to walk around   ADL's / Homemaking Assistance Needed: gets help from his daughters         Hand Dominance   Dominant Hand: Right    Extremity/Trunk Assessment  Upper Extremity Assessment Upper Extremity Assessment: Generalized weakness    Lower Extremity Assessment Lower Extremity Assessment: RLE deficits/detail;LLE deficits/detail RLE Deficits / Details: Blisters on legs. Sensory in tact.  Grossly 3+/5 throughout; unable to formally test secondary to large blisters on RLE. LLE Deficits / Details: Blisters on legs. Sensory in tact. Grossly 3+/5 throughout; unable to formally test secondary to large blisters on LLE.     Cervical / Trunk Assessment Cervical / Trunk Assessment: Kyphotic  Communication   Communication: No difficulties  Cognition Arousal/Alertness: Awake/alert Behavior During Therapy: WFL for tasks assessed/performed Overall Cognitive Status: History of cognitive impairments - at baseline                                 General Comments: History of dementia. Pt verbal history seems to line up with RN report prior to session.       General Comments General comments (skin integrity, edema, etc.): No family present during session. Per RN, pt is staying with daughter as his wife is in the hospital. Pt wanting to go home, so will require HHPT and 24/7 supervision.    Exercises     Assessment/Plan    PT Assessment Patient needs continued PT services  PT Problem List Decreased cognition;Decreased coordination;Decreased mobility;Decreased balance;Decreased strength;Decreased knowledge of use of DME;Decreased safety awareness;Decreased knowledge of precautions       PT Treatment Interventions Gait training;DME instruction;Functional mobility training;Therapeutic activities;Therapeutic exercise;Balance training;Neuromuscular re-education;Cognitive remediation;Patient/family education    PT Goals (Current goals can be found in the Care Plan section)  Acute Rehab PT Goals Patient Stated Goal: to go home  PT Goal Formulation: With patient Time For Goal Achievement: 09/28/16 Potential to Achieve Goals: Good    Frequency Min 3X/week   Barriers to discharge        Co-evaluation               AM-PAC PT "6 Clicks" Daily Activity  Outcome Measure Difficulty turning over in bed (including adjusting bedclothes, sheets and blankets)?: A  Little Difficulty moving from lying on back to sitting on the side of the bed? : A Little Difficulty sitting down on and standing up from a chair with arms (e.g., wheelchair, bedside commode, etc,.)?: Total Help needed moving to and from a bed to chair (including a wheelchair)?: A Little Help needed walking in hospital room?: A Little Help needed climbing 3-5 steps with a railing? : A Lot 6 Click Score: 15    End of Session Equipment Utilized During Treatment: Gait belt Activity Tolerance: Patient tolerated treatment well Patient left: in chair;with call bell/phone within reach;with chair alarm set Nurse Communication: Mobility status PT Visit Diagnosis: Unsteadiness on feet (R26.81)    Time: 2683-4196 PT Time Calculation (min) (ACUTE ONLY): 27 min   Charges:   PT Evaluation $PT Eval Moderate Complexity: 1 Procedure PT Treatments $Gait Training: 8-22 mins   PT G Codes:        Leighton Ruff, PT, DPT  Acute Rehabilitation Services  Pager: [phone]   Rudean Hitt 09/21/2016, 1:55 PM

## 2016-09-21 NOTE — Discharge Summary (Signed)
Physician Discharge Summary  Harry [name] MGQ:[id number] DOB: 1945/06/29 DOA: 09/19/2016  PCP: [name], MD  Admit date: 09/19/2016 Discharge date: 09/21/2016  Recommendations for Outpatient Follow-up:  Wound type: 6 blisters bilateral knees and right upper thigh, serous filled Pressure Injury POA: NA Measurement: right upper thigh: extends 6cm x 8cm x 0 with surrounding blanchable erythema  Right knee: 4.5cm x 5cm x 0cm, serous filled blister Left knee: 3cm x 4cm x 0cm and 1.5cm x 3cm x 0cm Wound bed: intact, serous filled blisters  Drainage (amount, consistency, odor) none Periwound: intact  With intact serous blisters will suggest that we try to keep the blisters intact, especially with history of DM. Will ask staff to paint blisters with betadine for antiseptic effects and will cover with single layer of xeroform for antibacterial effects. Change every other day.   Discharge Diagnoses:  Principal Problem:   Acute renal failure superimposed on stage 3 chronic kidney disease (HCC) Active Problems:   Type 2 diabetes mellitus with stage 3 chronic kidney disease, with long-term current use of insulin (HCC)   Essential hypertension   Altered mental status   Dehydration   Normocytic anemia   AKI (acute kidney injury) ([place])    Discharge Condition: stable   Diet recommendation: as tolerated   History of present illness:  71 year old male with past medical history of dementia, stroke, CKD stage 3, hypertension, left eye blindness who presented to ED with worsening mental status changes, wondering outside the day rpiro to the admission. Pt would often go outside to smoke but this time his son in law noticed he wondered away and found him sitting in the sun, slumped over and groggy, sweating. He was found to be dehydrated and to  have AKI on admission and so was admitted for hydration.  Hospital Course:  Principal Problem:   Acute renal failure superimposed on stage 3 chronic  kidney disease (HCC) / Dehydration  - Due to dehydration - No hydronephrosis on renal US - Cr improved with fluids   Active Problems:   Acute metabolic encephalopathy / Dementia without behavioral disturbance  - CT head with chronic ischemic changes - Per PT - HH recommended , refused SNF placement     Type 2 diabetes mellitus with stage 3 chronic kidney disease, with long-term current use of insulin (HCC) - Continue insulin as prescribed    Essential hypertension - Continue Norvasc and metoprolol     Dyslipidemia associated with type 2 DM - Continue Crestor     History of CVA - Continue aspirin and plavix     Depression - Continue zoloft     Normocytic anemia - Hgb stable at 10.7 - No reports of bleeding     Right upper thigh and knee blisters - WOC assessment done in hospital - Continue on discharge    DVT prophylaxis: SCD's Code Status: full code  Family Communication: family not at the bedside this am    Consultants:   PT  WOC  Procedures:   None  Antimicrobials:   None    Signed:  Leisa Lenz, MD  [facility] 09/21/2016, 2:03 PM  Pager #: [phone]  Time spent in minutes: more than 30 minutes   Discharge Exam: Vitals:   09/20/16 2258 09/21/16 0443  BP: (!) 143/65 119/68  Pulse: 73 76  Resp:  16  Temp:  97.8 F (36.6 C)   Vitals:   09/20/16 2145 09/20/16 2221 09/20/16 2258 09/21/16 0443  BP: (!) 200/94 [name] Kitchen)  187/88 (!) 143/65 119/68  Pulse: 73 70 73 76  Resp: 18   16  Temp:    97.8 F (36.6 C)  TempSrc:    Oral  SpO2: 100%   100%  Weight:      Height:        General: Pt is alert, follows commands appropriately, not in acute distress Cardiovascular: Regular rate and rhythm, S1/S2 +, no murmurs Respiratory: Clear to auscultation bilaterally, no wheezing, no crackles, no rhonchi Abdominal: Soft, non tender, non distended, bowel sounds +, no guarding Extremities: blisters on thighs and knees Neuro:  Grossly nonfocal  Discharge Instructions  Discharge Instructions    Call MD for:  persistant nausea and vomiting    Complete by:  As directed    Call MD for:  redness, tenderness, or signs of infection (pain, swelling, redness, odor or green/yellow discharge around incision site)    Complete by:  As directed    Call MD for:  severe uncontrolled pain    Complete by:  As directed    Diet - low sodium heart healthy    Complete by:  As directed    Increase activity slowly    Complete by:  As directed      Allergies as of 09/21/2016      Reactions   Lipitor [atorvastatin] Other (See Comments)   Myalgia   Statins Other (See Comments)   Myalgia (CAN tolerate Crestor, however)   Pravachol [pravastatin] Rash      Medication List    TAKE these medications   acetaminophen 500 MG tablet Commonly known as:  TYLENOL Take 1,000 mg by mouth every 6 (six) hours as needed (fever, headaches, or pain).   amLODipine 5 MG tablet Commonly known as:  NORVASC Take 2.5 mg by mouth daily.   ARTIFICIAL TEARS 1.4 % ophthalmic solution Generic drug:  polyvinyl alcohol Place 1 drop into both eyes 2 (two) times daily as needed for dry eyes.   aspirin 81 MG EC tablet Take 1 tablet (81 mg total) by mouth daily.   BASAGLAR KWIKPEN 100 UNIT/ML Sopn Inject 20 Units into the skin 2 (two) times daily.   clopidogrel 75 MG tablet Commonly known as:  PLAVIX TAKE 1 TABLET (75 MG TOTAL) BY MOUTH DAILY WITH BREAKFAST.   ferrous sulfate 325 (65 FE) MG tablet Take 1 tablet (325 mg total) by mouth 2 (two) times daily with a meal.   gabapentin 300 MG capsule Commonly known as:  NEURONTIN Take 300 mg by mouth 2 (two) times daily. Reported on 02/25/2015   metoprolol tartrate 50 MG tablet Commonly known as:  LOPRESSOR Take 1 tablet (50 mg total) by mouth 2 (two) times daily.   rosuvastatin 5 MG tablet Commonly known as:  CRESTOR TAKE ONE TABLET BY MOUTH DAILY AT 6PM What changed:  See the new  instructions.   sertraline 25 MG tablet Commonly known as:  ZOLOFT Take 1 tablet (25 mg total) by mouth daily.   Vitamin D (Ergocalciferol) 50000 units Caps capsule Commonly known as:  DRISDOL Take 50,000 Units by mouth every Saturday.      Follow-up Information    [name], MD. Schedule an appointment as soon as possible for a visit in 1 week(s).   Specialty:  Internal Medicine Contact information: [address] [place] [address] [phone]            The results of significant diagnostics from this hospitalization (including imaging, microbiology, ancillary and laboratory) are listed below  for reference.    Significant Diagnostic Studies: Ct Head Wo Contrast  Result Date: 09/19/2016 CLINICAL DATA:  Altered mental status EXAM: CT HEAD WITHOUT CONTRAST TECHNIQUE: Contiguous axial images were obtained from the base of the skull through the vertex without intravenous contrast. COMPARISON:  CT head 06/05/2016 FINDINGS: Brain: Moderate atrophy. Chronic infarct left occipital lobe, and left cerebellum. Small chronic infarct right occipital lobe. Chronic ischemia in the white matter. Hypodensity in the pons consistent with chronic infarct as noted on prior MRI. Negative for acute infarct, hemorrhage, or mass. Negative for hydrocephalus. Vascular: Negative for hyperdense vessel Skull: Negative Sinuses/Orbits: Negative Other: None IMPRESSION: Atrophy and extensive chronic ischemic changes. No acute abnormality. Electronically Signed   By: Franchot Gallo M.D.   On: 09/19/2016 19:58   US Renal  Result Date: 09/20/2016 CLINICAL DATA:  71 year old male with a history of acute kidney injury EXAM: RENAL / URINARY TRACT ULTRASOUND COMPLETE COMPARISON:  CT 06/30/2016 FINDINGS: Right Kidney: Length: 10.9 cm. No significant renal cortical thinning. The echogenicity of the renal parenchyma is symmetric to the left and not significantly different than the adjacent liver  parenchyma. Cystic structure with anechoic features through transmission and no internal complexity or flow on the lateral cortex measures 1.6 cm greatest diameter. Second cystic structure the inferior cortex with no internal complexity or flow measures 1.7 cm greatest diameter. No hydronephrosis. Flow confirmed in the hilum of the right kidney Left Kidney: Length: 10.8 cm. No significant renal cortical thinning. Symmetric parenchymal echotexture to the right. No hydronephrosis. Flow confirmed in the hilum of the kidney. Bladder: Appears normal for degree of bladder distention. Prominent median lobe of the prostate. Estimated diameter of the prostate 4.9 cm x 4.1 cm x 5.4 cm IMPRESSION: Sonographic survey demonstrates no hydronephrosis of the left or right kidney. No significant renal cortical thinning, with unremarkable parenchymal echotexture. There are 2 separate Bosniak 1 cysts of the right kidney. Prominent prostate. Electronically Signed   By: Corrie Mckusick D.O.   On: 09/20/2016 08:36    Microbiology: No results found for this or any previous visit (from the past 240 hour(s)).   Labs: Basic Metabolic Panel:  Recent Labs Lab 09/19/16 1924 09/20/16 0254 09/21/16 0522  NA 140 144 140  K 4.5 3.8 3.5  CL 109 112* 111  CO2 22 27 24   GLUCOSE 86 92 65  BUN 31* 29* 22*  CREATININE 2.82* 2.17* 1.59*  CALCIUM 9.1 8.7* 8.2*   Liver Function Tests:  Recent Labs Lab 09/19/16 1924  AST 27  ALT 19  ALKPHOS 57  BILITOT 1.6*  PROT 7.1  ALBUMIN 3.9   No results for input(s): LIPASE, AMYLASE in the last 168 hours. No results for input(s): AMMONIA in the last 168 hours. CBC:  Recent Labs Lab 09/19/16 1924 09/20/16 0254 09/21/16 0522  WBC 8.5 7.7 4.8  NEUTROABS 5.8  --   --   HGB 11.8* 11.2* 10.7*  HCT 36.2* 34.4* 32.8*  MCV 92.8 93.2 92.4  PLT 233 219 215   Cardiac Enzymes:  Recent Labs Lab 09/19/16 1924  CKTOTAL 179  TROPONINI <0.03   BNP: BNP (last 3 results)  Recent  Labs  06/05/16 1755  BNP 38.2    ProBNP (last 3 results) No results for input(s): PROBNP in the last 8760 hours.  CBG:  Recent Labs Lab 09/20/16 2056 09/21/16 0736 09/21/16 0812 09/21/16 0905 09/21/16 1215  GLUCAP 222* 64* 54* 139* 185*

## 2016-09-21 NOTE — Progress Notes (Addendum)
Per [place] nurse patient should follow dressing change order for at home also.    Paint blisters on the bilateral thighs and knees with betadine, cover with single layer of xeroform. Reapply/change every other day.  If blisters rupture, continue only xeroform gauze.

## 2016-09-21 NOTE — Progress Notes (Signed)
Patient has family at bedside to pick him up and requesting to leave. I let him know we are awaiting PT evaluation charting and have to get discharge paperwork together.

## 2016-09-21 NOTE — Progress Notes (Signed)
Harry [name] to be D/C'd Home per MD order.  Discussed with the patient and all questions fully answered.  VSS, Skin clean, dry and intact without evidence of skin break down, no evidence of skin tears noted. IV catheter discontinued intact. Site without signs and symptoms of complications. Dressing and pressure applied.  An After Visit Summary was printed and given to the patient. Patient received prescription.  D/c education completed with patient/family including follow up instructions, medication list, d/c activities limitations if indicated, with other d/c instructions as indicated by MD - patient able to verbalize understanding, all questions fully answered.   Patient instructed to return to ED, call 911, or call MD for any changes in condition.   Patient escorted via [place], and D/C home via private auto. Allergies as of 09/21/2016      Reactions   Lipitor [atorvastatin] Other (See Comments)   Myalgia   Statins Other (See Comments)   Myalgia (CAN tolerate Crestor, however)   Pravachol [pravastatin] Rash      Medication List    TAKE these medications   acetaminophen 500 MG tablet Commonly known as:  TYLENOL Take 1,000 mg by mouth every 6 (six) hours as needed (fever, headaches, or pain).   amLODipine 5 MG tablet Commonly known as:  NORVASC Take 2.5 mg by mouth daily.   ARTIFICIAL TEARS 1.4 % ophthalmic solution Generic drug:  polyvinyl alcohol Place 1 drop into both eyes 2 (two) times daily as needed for dry eyes.   aspirin 81 MG EC tablet Take 1 tablet (81 mg total) by mouth daily.   BASAGLAR KWIKPEN 100 UNIT/ML Sopn Inject 20 Units into the skin 2 (two) times daily.   clopidogrel 75 MG tablet Commonly known as:  PLAVIX TAKE 1 TABLET (75 MG TOTAL) BY MOUTH DAILY WITH BREAKFAST.   ferrous sulfate 325 (65 FE) MG tablet Take 1 tablet (325 mg total) by mouth 2 (two) times daily with a meal.   gabapentin 300 MG capsule Commonly known as:  NEURONTIN Take 300 mg by  mouth 2 (two) times daily. Reported on 02/25/2015   metoprolol tartrate 50 MG tablet Commonly known as:  LOPRESSOR Take 1 tablet (50 mg total) by mouth 2 (two) times daily.   rosuvastatin 5 MG tablet Commonly known as:  CRESTOR TAKE ONE TABLET BY MOUTH DAILY AT 6PM What changed:  See the new instructions.   sertraline 25 MG tablet Commonly known as:  ZOLOFT Take 1 tablet (25 mg total) by mouth daily.   Vitamin D (Ergocalciferol) 50000 units Caps capsule Commonly known as:  DRISDOL Take 50,000 Units by mouth every Saturday.     patient advised to start with 5 units bid hold lantus if CBG less than 120 per dr Charlies Silvers. Harry [name] Harry [name] 09/21/2016 4:01 PM

## 2016-09-21 NOTE — Care Management Note (Signed)
Case Management Note  Patient Details  Name: Harry [name] MRN: [id number] Date of Birth: 1945/04/19  Subjective/Objective:     Acute renal failure, superimposed on CKD, blisters on BLE, heat exposure                Action/Plan: Discharge Planning: NCM spoke to pt and offered choice for HH/list provided. Pt has RW and cane at home. Pt requested [facility]. Contacted [facility] rep with new referral.   PCP [name] MD  Expected Discharge Date:  09/21/16               Expected Discharge Plan:  [place]  In-House Referral:  NA  Discharge planning Services  CM Consult  Post Acute Care Choice:  Home Health Choice offered to:  Patient  DME Arranged:  N/A DME Agency:  NA  HH Arranged:  PT, RN, OT, Nurse's Aide [place] Agency:  [place]  Status of Service:  Completed, signed off  If discussed at [place] of Stay Meetings, dates discussed:    Additional Comments:  Erenest Rasher, RN 09/21/2016, 3:52 PM

## 2016-09-21 NOTE — Consult Note (Signed)
   [facility] CM Inpatient Consult   09/21/2016  Harry [name] 04/05/1945 [id number]  Patient was assessed for [facility] for community services. Patient was previously active with [facility] but staff having difficulty maintaining contact.  Met with patient and in law, [name],  at bedside regarding being restarted with [facility] services. Consent form active signed and folder with [facility] information given.  Patient being discharged and gave consent to call his daughter [name] at [phone].  [facility] per in law, [name], was the agency for home care.  Spoke with [name] with [facility] regarding the patient discharge and post hospital follow up issues.  Explained about having the difficulty maintaining contact as the patient's wife is in the hospital and daughter works and patient states his daughter will be the best contact.  Patient will have her transition of care  Follow up with [facility].  Of note, [facility] Care Management services does not replace or interfere with any services that are arranged by inpatient case management or social work. For additional questions or referrals please contact:  Natividad Brood, RN BSN [facility] Liaison  [phone] business mobile phone Toll free office [phone]

## 2016-09-21 NOTE — Progress Notes (Addendum)
Patient ID: Harry [name], male   DOB: 26-Dec-1945, 71 y.o.   MRN: [id number]  PROGRESS NOTE    AARUSH STUKEY  CXK:[id number] DOB: Jan 27, 1946 DOA: 09/19/2016  PCP: [name], MD   Brief Narrative:  71 year old male with past medical history of dementia, stroke, CKD stage 3, hypertension, left eye blindness who presented to ED with worsening mental status changes, wondering outside the day rpiro to the admission. Pt would often go outside to smoke but this time his son in law noticed he wondered away and found him sitting in the sun, slumped over and groggy, sweating. He was found to be dehydrated and to  have AKI on admission and so was admitted was hydration.   Assessment & Plan:   Principal Problem:   Acute renal failure superimposed on stage 3 chronic kidney disease ([place]) / Dehydration  - Due to dehydration - Cr much better with IV fluids  - No hydronephrosis on renal US  Active Problems:   Acute metabolic encephalopathy / Dementia without behavioral disturbance  - CT head with chronic ischemic changes - PT evaluation pending     Type 2 diabetes mellitus with stage 3 chronic kidney disease, with long-term current use of insulin (HCC) - Due to hypoglycemia insulin adjusted to 5 mg BID Lantus and SSI - CBG's in past 24 hours: 64, 54, 139    Essential hypertension - Continue Norvasc and metoprolol     Dyslipidemia associated with type 2 DM - Continue Crestor     History of CVA - Continue aspirin and plavix     Depression - Continue Zoloft     Normocytic anemia - Hgb stable at 10.7 - No bleeding     Right upper thigh and knee blisters - Seen by WOC   DVT prophylaxis: to minimize risk of bleeding use SCD's in addition to pt asa and plavix Code Status: full code  Family Communication: called pt spouse this am, no answer, no family at the bedside this am Disposition Plan: likely to SNF< PT pending    Consultants:   PT  WOC  Procedures:   None    Antimicrobials:   None    Subjective: No overnight events.  Objective: Vitals:   09/20/16 2145 09/20/16 2221 09/20/16 2258 09/21/16 0443  BP: (!) 200/94 (!) 187/88 (!) 143/65 119/68  Pulse: 73 70 73 76  Resp: 18   16  Temp:    97.8 F (36.6 C)  TempSrc:    Oral  SpO2: 100%   100%  Weight:      Height:        Intake/Output Summary (Last 24 hours) at 09/21/16 0949 Last data filed at 09/21/16 0924  Gross per 24 hour  Intake          3294.58 ml  Output              400 ml  Net          2894.58 ml   Filed Weights   09/19/16 2309  Weight: 87.5 kg (192 lb 12.8 oz)    Physical Exam  Constitutional: Appears well-developed and well-nourished. No distress.  CVS: RRR, S1/S2 + Pulmonary: Effort and breath sounds normal, no stridor, rhonchi, wheezes, rales.  Abdominal: Soft. BS +,  no distension, tenderness, rebound or guarding.  Musculoskeletal: Normal range of motion. No edema and no tenderness.  Lymphadenopathy: No lymphadenopathy noted, cervical, inguinal. Neuro: Alert. No neurologic deficits Skin: blisters on thighs and knee Psychiatric:  Normal mood and affect.    Data Reviewed: I have personally reviewed following labs and imaging studies  CBC:  Recent Labs Lab 09/19/16 1924 09/20/16 0254 09/21/16 0522  WBC 8.5 7.7 4.8  NEUTROABS 5.8  --   --   HGB 11.8* 11.2* 10.7*  HCT 36.2* 34.4* 32.8*  MCV 92.8 93.2 92.4  PLT 233 219 657   Basic Metabolic Panel:  Recent Labs Lab 09/19/16 1924 09/20/16 0254 09/21/16 0522  NA 140 144 140  K 4.5 3.8 3.5  CL 109 112* 111  CO2 22 27 24   GLUCOSE 86 92 65  BUN 31* 29* 22*  CREATININE 2.82* 2.17* 1.59*  CALCIUM 9.1 8.7* 8.2*   GFR: Estimated Creatinine Clearance: 48.1 mL/min (A) (by C-G formula based on SCr of 1.59 mg/dL (H)). Liver Function Tests:  Recent Labs Lab 09/19/16 1924  AST 27  ALT 19  ALKPHOS 57  BILITOT 1.6*  PROT 7.1  ALBUMIN 3.9   No results for input(s): LIPASE, AMYLASE in the last  168 hours. No results for input(s): AMMONIA in the last 168 hours. Coagulation Profile: No results for input(s): INR, PROTIME in the last 168 hours. Cardiac Enzymes:  Recent Labs Lab 09/19/16 1924  CKTOTAL 179  TROPONINI <0.03   BNP (last 3 results) No results for input(s): PROBNP in the last 8760 hours. HbA1C: No results for input(s): HGBA1C in the last 72 hours. CBG:  Recent Labs Lab 09/20/16 1749 09/20/16 2056 09/21/16 0736 09/21/16 0812 09/21/16 0905  GLUCAP 119* 222* 64* 54* 139*   Lipid Profile: No results for input(s): CHOL, HDL, LDLCALC, TRIG, CHOLHDL, LDLDIRECT in the last 72 hours. Thyroid Function Tests: No results for input(s): TSH, T4TOTAL, FREET4, T3FREE, THYROIDAB in the last 72 hours. Anemia Panel: No results for input(s): VITAMINB12, FOLATE, FERRITIN, TIBC, IRON, RETICCTPCT in the last 72 hours. Urine analysis:    Component Value Date/Time   COLORURINE YELLOW 09/19/2016 2011   APPEARANCEUR HAZY (A) 09/19/2016 2011   LABSPEC 1.012 09/19/2016 2011   PHURINE 5.0 09/19/2016 2011   GLUCOSEU 150 (A) 09/19/2016 2011   HGBUR SMALL (A) 09/19/2016 2011   BILIRUBINUR NEGATIVE 09/19/2016 2011   [place] NEGATIVE 09/19/2016 2011   PROTEINUR NEGATIVE 09/19/2016 2011   UROBILINOGEN 0.2 10/09/2014 2112   NITRITE NEGATIVE 09/19/2016 2011   LEUKOCYTESUR NEGATIVE 09/19/2016 2011   Sepsis Labs: @LABRCNTIP (procalcitonin:4,lacticidven:4)   )No results found for this or any previous visit (from the past 240 hour(s)).    Radiology Studies: Ct Head Wo Contrast  Result Date: 09/19/2016 CLINICAL DATA:  Altered mental status EXAM: CT HEAD WITHOUT CONTRAST TECHNIQUE: Contiguous axial images were obtained from the base of the skull through the vertex without intravenous contrast. COMPARISON:  CT head 06/05/2016 FINDINGS: Brain: Moderate atrophy. Chronic infarct left occipital lobe, and left cerebellum. Small chronic infarct right occipital lobe. Chronic ischemia in the  white matter. Hypodensity in the pons consistent with chronic infarct as noted on prior MRI. Negative for acute infarct, hemorrhage, or mass. Negative for hydrocephalus. Vascular: Negative for hyperdense vessel Skull: Negative Sinuses/Orbits: Negative Other: None IMPRESSION: Atrophy and extensive chronic ischemic changes. No acute abnormality. Electronically Signed   By: Franchot Gallo M.D.   On: 09/19/2016 19:58   US Renal  Result Date: 09/20/2016 CLINICAL DATA:  71 year old male with a history of acute kidney injury EXAM: RENAL / URINARY TRACT ULTRASOUND COMPLETE COMPARISON:  CT 06/30/2016 FINDINGS: Right Kidney: Length: 10.9 cm. No significant renal cortical thinning. The echogenicity of the renal parenchyma  is symmetric to the left and not significantly different than the adjacent liver parenchyma. Cystic structure with anechoic features through transmission and no internal complexity or flow on the lateral cortex measures 1.6 cm greatest diameter. Second cystic structure the inferior cortex with no internal complexity or flow measures 1.7 cm greatest diameter. No hydronephrosis. Flow confirmed in the hilum of the right kidney Left Kidney: Length: 10.8 cm. No significant renal cortical thinning. Symmetric parenchymal echotexture to the right. No hydronephrosis. Flow confirmed in the hilum of the kidney. Bladder: Appears normal for degree of bladder distention. Prominent median lobe of the prostate. Estimated diameter of the prostate 4.9 cm x 4.1 cm x 5.4 cm IMPRESSION: Sonographic survey demonstrates no hydronephrosis of the left or right kidney. No significant renal cortical thinning, with unremarkable parenchymal echotexture. There are 2 separate Bosniak 1 cysts of the right kidney. Prominent prostate. Electronically Signed   By: Corrie Mckusick D.O.   On: 09/20/2016 08:36      Scheduled Meds: . amLODipine  2.5 mg Oral Daily  . aspirin EC  81 mg Oral Daily  . clopidogrel  75 mg Oral Q breakfast  .  enoxaparin (LOVENOX) injection  40 mg Subcutaneous Q24H  . ferrous sulfate  325 mg Oral BID WC  . gabapentin  300 mg Oral BID  . insulin aspart  0-5 Units Subcutaneous QHS  . insulin aspart  0-9 Units Subcutaneous TID WC  . insulin glargine  5 Units Subcutaneous BID  . metoprolol tartrate  50 mg Oral BID  . rosuvastatin  5 mg Oral q1800  . sertraline  25 mg Oral Daily   Continuous Infusions: . sodium chloride 125 mL/hr at 09/21/16 0212     LOS: 1 day    Time spent: 25 minutes  Greater than 50% of the time spent on counseling and coordinating the care.   Leisa Lenz, MD [facility] Pager [phone]  If 7PM-7AM, please contact night-coverage [email or web address] Password [facility] 09/21/2016, 9:49 AM

## 2016-09-24 ENCOUNTER — Other Ambulatory Visit: Payer: Self-pay

## 2016-09-24 DIAGNOSIS — F17200 Nicotine dependence, unspecified, uncomplicated: Secondary | ICD-10-CM | POA: Diagnosis not present

## 2016-09-24 DIAGNOSIS — E1143 Type 2 diabetes mellitus with diabetic autonomic (poly)neuropathy: Secondary | ICD-10-CM | POA: Diagnosis not present

## 2016-09-24 DIAGNOSIS — Z0000 Encounter for general adult medical examination without abnormal findings: Secondary | ICD-10-CM | POA: Diagnosis not present

## 2016-09-24 DIAGNOSIS — E039 Hypothyroidism, unspecified: Secondary | ICD-10-CM | POA: Diagnosis not present

## 2016-09-24 DIAGNOSIS — I10 Essential (primary) hypertension: Secondary | ICD-10-CM | POA: Diagnosis not present

## 2016-09-24 DIAGNOSIS — E559 Vitamin D deficiency, unspecified: Secondary | ICD-10-CM | POA: Diagnosis not present

## 2016-09-24 DIAGNOSIS — L551 Sunburn of second degree: Secondary | ICD-10-CM | POA: Diagnosis not present

## 2016-09-24 DIAGNOSIS — E1165 Type 2 diabetes mellitus with hyperglycemia: Secondary | ICD-10-CM | POA: Diagnosis not present

## 2016-09-25 ENCOUNTER — Ambulatory Visit: Payer: Medicare Other | Admitting: Cardiovascular Disease

## 2016-09-25 DIAGNOSIS — Z794 Long term (current) use of insulin: Secondary | ICD-10-CM | POA: Diagnosis not present

## 2016-09-25 DIAGNOSIS — E11311 Type 2 diabetes mellitus with unspecified diabetic retinopathy with macular edema: Secondary | ICD-10-CM | POA: Diagnosis not present

## 2016-09-25 DIAGNOSIS — L551 Sunburn of second degree: Secondary | ICD-10-CM | POA: Diagnosis not present

## 2016-09-25 DIAGNOSIS — S71102D Unspecified open wound, left thigh, subsequent encounter: Secondary | ICD-10-CM | POA: Diagnosis not present

## 2016-09-25 DIAGNOSIS — Z7982 Long term (current) use of aspirin: Secondary | ICD-10-CM | POA: Diagnosis not present

## 2016-09-25 DIAGNOSIS — E1122 Type 2 diabetes mellitus with diabetic chronic kidney disease: Secondary | ICD-10-CM | POA: Diagnosis not present

## 2016-09-25 DIAGNOSIS — S81002D Unspecified open wound, left knee, subsequent encounter: Secondary | ICD-10-CM | POA: Diagnosis not present

## 2016-09-25 DIAGNOSIS — D631 Anemia in chronic kidney disease: Secondary | ICD-10-CM | POA: Diagnosis not present

## 2016-09-25 DIAGNOSIS — I129 Hypertensive chronic kidney disease with stage 1 through stage 4 chronic kidney disease, or unspecified chronic kidney disease: Secondary | ICD-10-CM | POA: Diagnosis not present

## 2016-09-25 DIAGNOSIS — E1151 Type 2 diabetes mellitus with diabetic peripheral angiopathy without gangrene: Secondary | ICD-10-CM | POA: Diagnosis not present

## 2016-09-25 DIAGNOSIS — F17210 Nicotine dependence, cigarettes, uncomplicated: Secondary | ICD-10-CM | POA: Diagnosis not present

## 2016-09-25 DIAGNOSIS — E1142 Type 2 diabetes mellitus with diabetic polyneuropathy: Secondary | ICD-10-CM | POA: Diagnosis not present

## 2016-09-25 DIAGNOSIS — F0151 Vascular dementia with behavioral disturbance: Secondary | ICD-10-CM | POA: Diagnosis not present

## 2016-09-25 DIAGNOSIS — N189 Chronic kidney disease, unspecified: Secondary | ICD-10-CM | POA: Diagnosis not present

## 2016-09-25 DIAGNOSIS — S71101D Unspecified open wound, right thigh, subsequent encounter: Secondary | ICD-10-CM | POA: Diagnosis not present

## 2016-09-25 DIAGNOSIS — S81001D Unspecified open wound, right knee, subsequent encounter: Secondary | ICD-10-CM | POA: Diagnosis not present

## 2016-09-25 DIAGNOSIS — Z7902 Long term (current) use of antithrombotics/antiplatelets: Secondary | ICD-10-CM | POA: Diagnosis not present

## 2016-09-25 NOTE — Patient Outreach (Addendum)
Harry [name]) Care Management  09/25/2016   Harry [name] [birth year] [id number]  Subjective:  I got out the hospital on Friday. I got real confused, dont know how I ended up in the hospital.  Objective:  71 year old male with history of diabetes, heart failure and COPD.  Current Medications:  Current Outpatient Prescriptions  Medication Sig Dispense Refill  . acetaminophen (TYLENOL) 500 MG tablet Take 1,000 mg by mouth every 6 (six) hours as needed (fever, headaches, or pain).     [name] Kitchen amLODipine (NORVASC) 5 MG tablet Take 2.5 mg by mouth daily.    [name] Kitchen aspirin 81 MG EC tablet Take 1 tablet (81 mg total) by mouth daily.    . clopidogrel (PLAVIX) 75 MG tablet TAKE 1 TABLET (75 MG TOTAL) BY MOUTH DAILY WITH BREAKFAST. 90 tablet 2  . ferrous sulfate 325 (65 FE) MG tablet Take 1 tablet (325 mg total) by mouth 2 (two) times daily with a meal. 60 tablet 1  . gabapentin (NEURONTIN) 300 MG capsule Take 300 mg by mouth 2 (two) times daily. Reported on 02/25/2015  1  . Insulin Glargine (BASAGLAR KWIKPEN) 100 UNIT/ML SOPN Inject 20 Units into the skin 2 (two) times daily.     . metoprolol (LOPRESSOR) 50 MG tablet Take 1 tablet (50 mg total) by mouth 2 (two) times daily. 60 tablet 0  . polyvinyl alcohol (ARTIFICIAL TEARS) 1.4 % ophthalmic solution Place 1 drop into both eyes 2 (two) times daily as needed for dry eyes.     . rosuvastatin (CRESTOR) 5 MG tablet TAKE ONE TABLET BY MOUTH DAILY AT 6PM (Patient taking differently: Take 5 mg by mouth in the evening) 30 tablet 1  . sertraline (ZOLOFT) 25 MG tablet Take 1 tablet (25 mg total) by mouth daily. 30 tablet 12  . Vitamin D, Ergocalciferol, (DRISDOL) 50000 units CAPS capsule Take 50,000 Units by mouth every Saturday.     No current facility-administered medications for this visit.     Functional Status:  In your present state of health, do you have any difficulty performing the following activities: 09/19/2016 07/13/2016   Hearing? N N  Vision? Y Y  Difficulty concentrating or making decisions? Y N  Walking or climbing stairs? Y Y  Dressing or bathing? N N  Doing errands, shopping? Tempie Donning  Preparing Food and eating ? - Y  Using the Toilet? - N  In the past six months, have you accidently leaked urine? - N  Do you have problems with loss of bowel control? - N  Managing your Medications? - N  Managing your Finances? - Y  Housekeeping or managing your Housekeeping? - Y  Some recent data might be hidden    Fall/Depression Screening: Fall Risk  07/13/2016 11/11/2015 10/31/2015  Falls in the past year? Yes Yes Yes  Number falls in past yr: 1 2 or more 2 or more  Injury with Fall? No No No  Risk for fall due to : History of fall(s);Impaired balance/gait;Impaired mobility;Impaired vision Impaired mobility -  Risk for fall due to (comments): - "problems with legs" -  Follow up Falls prevention discussed;Education provided - -   PHQ 2/9 Scores 07/13/2016  PHQ - 2 Score 0   [facility] CM Care Plan Problem One     Most Recent Value  Care Plan Problem One  Patient was recently discharged from the acute care setting  Role Documenting the Problem One  Care Management [place] for  Problem One  Active  [facility] Long Term Goal   Patient will have no acute care admission in the next 31 days  [facility] Long Term Goal Start Date  09/24/16  Interventions for Problem One Long Term Goal  7/23 patient discharged from the acute care setting on 7/20   [facility] CM Short Term Goal #1   In the next 28 days, patient will have 100% attendance with medical and medicsl disciple appointmnets  [facility] CM Short Term Goal #1 Start Date  09/24/16  Interventions for Short Term Goal #1  7/23 transition of care call completed    [facility] CM Care Plan Problem Two     Most Recent Value  Care Plan Problem Two  Patient needs dental care  Role Documenting the Problem Two  Care Management [place] for Problem Two  Active  [facility] CM Short Term Goal #1    Patient will meet with [facility] LCSW to assess community resources for affordable dental care   [facility] CM Short Term Goal #1 Start Date  09/24/16     Assessment:  Patient refused to [facility] for ocmmunity care coordination, has 2 recent hospitalization. Patient is currently living in the home with his daughter, her husband and his wife, who is also a patient of [facility] . Patient has history of COPD, continues to smoke, diabetes and hypertension. Patient agreed to [facility] coordination, assisted RNCM in creation of his case management goals.  Plan:  Home visit in the next 21 days for chronic disease education, assessment of need for community referrals.

## 2016-09-27 DIAGNOSIS — F0151 Vascular dementia with behavioral disturbance: Secondary | ICD-10-CM | POA: Diagnosis not present

## 2016-09-27 DIAGNOSIS — S71101D Unspecified open wound, right thigh, subsequent encounter: Secondary | ICD-10-CM | POA: Diagnosis not present

## 2016-09-27 DIAGNOSIS — S81002D Unspecified open wound, left knee, subsequent encounter: Secondary | ICD-10-CM | POA: Diagnosis not present

## 2016-09-27 DIAGNOSIS — S81001D Unspecified open wound, right knee, subsequent encounter: Secondary | ICD-10-CM | POA: Diagnosis not present

## 2016-09-27 DIAGNOSIS — S71102D Unspecified open wound, left thigh, subsequent encounter: Secondary | ICD-10-CM | POA: Diagnosis not present

## 2016-09-27 DIAGNOSIS — E1142 Type 2 diabetes mellitus with diabetic polyneuropathy: Secondary | ICD-10-CM | POA: Diagnosis not present

## 2016-10-01 DIAGNOSIS — S71101D Unspecified open wound, right thigh, subsequent encounter: Secondary | ICD-10-CM | POA: Diagnosis not present

## 2016-10-01 DIAGNOSIS — F0151 Vascular dementia with behavioral disturbance: Secondary | ICD-10-CM | POA: Diagnosis not present

## 2016-10-01 DIAGNOSIS — S81002D Unspecified open wound, left knee, subsequent encounter: Secondary | ICD-10-CM | POA: Diagnosis not present

## 2016-10-01 DIAGNOSIS — S71102D Unspecified open wound, left thigh, subsequent encounter: Secondary | ICD-10-CM | POA: Diagnosis not present

## 2016-10-01 DIAGNOSIS — S81001D Unspecified open wound, right knee, subsequent encounter: Secondary | ICD-10-CM | POA: Diagnosis not present

## 2016-10-01 DIAGNOSIS — E1142 Type 2 diabetes mellitus with diabetic polyneuropathy: Secondary | ICD-10-CM | POA: Diagnosis not present

## 2016-10-02 ENCOUNTER — Other Ambulatory Visit: Payer: Self-pay

## 2016-10-02 NOTE — Patient Outreach (Signed)
[place] [facility]) Care Management   10/02/2016  JOSIA CUEVA 07-28-1945 [id number]  SLATE DEBROUX is an 71 y.o. male  Subjective:  I don't remember what happened.  Objective:   ROS Elderly, well nourished, quiet, polite gentleman sitting on sofa in living room. 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 Gentlman also has dressing on left mid leg.   Physical Exam ROS Encounter Medications:   Outpatient Encounter Prescriptions as of 10/02/2016  Medication Sig Note  . acetaminophen (TYLENOL) 500 MG tablet Take 1,000 mg by mouth every 6 (six) hours as needed (fever, headaches, or pain).    [name] Kitchen amLODipine (NORVASC) 5 MG tablet Take 2.5 mg by mouth daily.   [name] Kitchen aspirin 81 MG EC tablet Take 1 tablet (81 mg total) by mouth daily.   . clopidogrel (PLAVIX) 75 MG tablet TAKE 1 TABLET (75 MG TOTAL) BY MOUTH DAILY WITH BREAKFAST.   . ferrous sulfate 325 (65 FE) MG tablet Take 1 tablet (325 mg total) by mouth 2 (two) times daily with a meal.   . gabapentin (NEURONTIN) 300 MG capsule Take 300 mg by mouth 2 (two) times daily. Reported on 02/25/2015   . Insulin Glargine (BASAGLAR KWIKPEN) 100 UNIT/ML SOPN Inject 20 Units into the skin 2 (two) times daily.  09/19/2016: Dosage verified by the patient's daughter-in-law  . metoprolol (LOPRESSOR) 50 MG tablet Take 1 tablet (50 mg total) by mouth 2 (two) times daily.   . polyvinyl alcohol (ARTIFICIAL TEARS) 1.4 % ophthalmic solution Place 1 drop into both eyes 2 (two) times daily as needed for dry eyes.    . rosuvastatin (CRESTOR) 5 MG tablet TAKE ONE TABLET BY MOUTH DAILY AT 6PM (Patient taking differently: Take 5 mg by mouth in the evening)   . sertraline (ZOLOFT) 25 MG tablet Take 1 tablet (25 mg total) by mouth daily.   . Vitamin D, Ergocalciferol, (DRISDOL) 50000 units CAPS capsule Take 50,000 Units by mouth every Saturday.    No facility-administered encounter medications on file as of  10/02/2016.     Functional Status:   In your present state of health, do you have any difficulty performing the following activities: 10/02/2016 09/19/2016  Hearing? N N  Vision? Y Y  Comment - -  Difficulty concentrating or making decisions? Tempie Donning  Walking or climbing stairs? Y Y  Dressing or bathing? N N  Doing errands, shopping? Tempie Donning  Preparing Food and eating ? Y -  Using the Toilet? N -  In the past six months, have you accidently leaked urine? N -  Do you have problems with loss of bowel control? N -  Managing your Medications? Y -  Managing your Finances? Y -  Housekeeping or managing your Housekeeping? Y -  Some recent data might be hidden    Fall/Depression Screening:    Fall Risk  10/02/2016 07/13/2016 11/11/2015  Falls in the past year? Yes Yes Yes  Comment - - -  Number falls in past yr: 2 or more 1 2 or more  Comment - - -  Injury with Fall? Yes No No  Risk Factor Category  High Fall Risk - -  Risk for fall due to : History of fall(s);Impaired balance/gait;Impaired mobility;Impaired vision;Medication side effect History of fall(s);Impaired balance/gait;Impaired mobility;Impaired vision Impaired mobility  Risk for fall due to: Comment - - "problems with legs"  Follow up Falls evaluation completed;Education provided;Falls prevention discussed Falls prevention discussed;Education provided -  PHQ 2/9 Scores 10/02/2016 07/13/2016  PHQ - 2 Score 0 0   [facility] CM Care Plan Problem One     Most Recent Value  Care Plan Problem One  Patient was recently discharged from the acute care setting  Role Documenting the Problem One  Care Management [place] for Problem One  Active  [facility] Long Term Goal   Patient will have no acute care admission in the next 31 days  [facility] Long Term Goal Start Date  09/24/16  Interventions for Problem One Long Term Goal  7/23 patient discharged from the acute care setting on 7/20   [facility] CM Short Term Goal #1   In the next 28 days, patient will have  100% attendance with medical and medicsl disciple appointmnets  [facility] CM Short Term Goal #1 Start Date  09/24/16  Interventions for Short Term Goal #1  7/23 transition of care call completed    [facility] CM Care Plan Problem Two     Most Recent Value  Care Plan Problem Two  Patient needs dental care  Role Documenting the Problem Two  Care Management [place] for Problem Two  Active  [facility] CM Short Term Goal #1   Patient will meet with [facility] LCSW to assess community resources for affordable dental care   [facility] CM Short Term Goal #1 Start Date  09/24/16     Assessment:   Patient reports being in the hospital after having a fall outside in the grass, was exposed to the sun for a long period of time resulting in sun bun to his leg areas. Patient states he does not know exactly he is just going on what was told to hime. Patient and wife (who is also with [facility]) currently live with their daughter and her husband in a gaited apartment. Patient's daughter is a Therapist, sports at [employer] in the ICU. Daughter stated she would replace dressing which has fallen down around patient's ankle.  Patient stated the [facility] placed the dressing, however, the dressing was not secured properly and fell off. Patient denies need for this RNCM to redress or call [facility] agency to have RN to come redress. Patient, wife and this RNCM reviewed his case management care plan, updated accordingly  Plan:   Telephone contact next week for community care coordination, assessment of progress in meeting case managament goals

## 2016-10-03 DIAGNOSIS — E1142 Type 2 diabetes mellitus with diabetic polyneuropathy: Secondary | ICD-10-CM | POA: Diagnosis not present

## 2016-10-03 DIAGNOSIS — S81002D Unspecified open wound, left knee, subsequent encounter: Secondary | ICD-10-CM | POA: Diagnosis not present

## 2016-10-03 DIAGNOSIS — S71101D Unspecified open wound, right thigh, subsequent encounter: Secondary | ICD-10-CM | POA: Diagnosis not present

## 2016-10-03 DIAGNOSIS — S71102D Unspecified open wound, left thigh, subsequent encounter: Secondary | ICD-10-CM | POA: Diagnosis not present

## 2016-10-03 DIAGNOSIS — F0151 Vascular dementia with behavioral disturbance: Secondary | ICD-10-CM | POA: Diagnosis not present

## 2016-10-03 DIAGNOSIS — S81001D Unspecified open wound, right knee, subsequent encounter: Secondary | ICD-10-CM | POA: Diagnosis not present

## 2016-10-04 DIAGNOSIS — S71102D Unspecified open wound, left thigh, subsequent encounter: Secondary | ICD-10-CM | POA: Diagnosis not present

## 2016-10-04 DIAGNOSIS — S71101D Unspecified open wound, right thigh, subsequent encounter: Secondary | ICD-10-CM | POA: Diagnosis not present

## 2016-10-04 DIAGNOSIS — S81001D Unspecified open wound, right knee, subsequent encounter: Secondary | ICD-10-CM | POA: Diagnosis not present

## 2016-10-04 DIAGNOSIS — F0151 Vascular dementia with behavioral disturbance: Secondary | ICD-10-CM | POA: Diagnosis not present

## 2016-10-04 DIAGNOSIS — E1142 Type 2 diabetes mellitus with diabetic polyneuropathy: Secondary | ICD-10-CM | POA: Diagnosis not present

## 2016-10-04 DIAGNOSIS — S81002D Unspecified open wound, left knee, subsequent encounter: Secondary | ICD-10-CM | POA: Diagnosis not present

## 2016-10-08 DIAGNOSIS — S81002D Unspecified open wound, left knee, subsequent encounter: Secondary | ICD-10-CM | POA: Diagnosis not present

## 2016-10-08 DIAGNOSIS — S81001D Unspecified open wound, right knee, subsequent encounter: Secondary | ICD-10-CM | POA: Diagnosis not present

## 2016-10-08 DIAGNOSIS — E1142 Type 2 diabetes mellitus with diabetic polyneuropathy: Secondary | ICD-10-CM | POA: Diagnosis not present

## 2016-10-08 DIAGNOSIS — F0151 Vascular dementia with behavioral disturbance: Secondary | ICD-10-CM | POA: Diagnosis not present

## 2016-10-08 DIAGNOSIS — S71102D Unspecified open wound, left thigh, subsequent encounter: Secondary | ICD-10-CM | POA: Diagnosis not present

## 2016-10-08 DIAGNOSIS — S71101D Unspecified open wound, right thigh, subsequent encounter: Secondary | ICD-10-CM | POA: Diagnosis not present

## 2016-10-09 DIAGNOSIS — S81002D Unspecified open wound, left knee, subsequent encounter: Secondary | ICD-10-CM | POA: Diagnosis not present

## 2016-10-09 DIAGNOSIS — S81001D Unspecified open wound, right knee, subsequent encounter: Secondary | ICD-10-CM | POA: Diagnosis not present

## 2016-10-09 DIAGNOSIS — E1142 Type 2 diabetes mellitus with diabetic polyneuropathy: Secondary | ICD-10-CM | POA: Diagnosis not present

## 2016-10-09 DIAGNOSIS — S71101D Unspecified open wound, right thigh, subsequent encounter: Secondary | ICD-10-CM | POA: Diagnosis not present

## 2016-10-09 DIAGNOSIS — S71102D Unspecified open wound, left thigh, subsequent encounter: Secondary | ICD-10-CM | POA: Diagnosis not present

## 2016-10-09 DIAGNOSIS — F0151 Vascular dementia with behavioral disturbance: Secondary | ICD-10-CM | POA: Diagnosis not present

## 2016-10-11 DIAGNOSIS — F0151 Vascular dementia with behavioral disturbance: Secondary | ICD-10-CM | POA: Diagnosis not present

## 2016-10-11 DIAGNOSIS — S71101D Unspecified open wound, right thigh, subsequent encounter: Secondary | ICD-10-CM | POA: Diagnosis not present

## 2016-10-11 DIAGNOSIS — S81001D Unspecified open wound, right knee, subsequent encounter: Secondary | ICD-10-CM | POA: Diagnosis not present

## 2016-10-11 DIAGNOSIS — S81002D Unspecified open wound, left knee, subsequent encounter: Secondary | ICD-10-CM | POA: Diagnosis not present

## 2016-10-11 DIAGNOSIS — E1142 Type 2 diabetes mellitus with diabetic polyneuropathy: Secondary | ICD-10-CM | POA: Diagnosis not present

## 2016-10-11 DIAGNOSIS — S71102D Unspecified open wound, left thigh, subsequent encounter: Secondary | ICD-10-CM | POA: Diagnosis not present

## 2016-10-13 DIAGNOSIS — S81002D Unspecified open wound, left knee, subsequent encounter: Secondary | ICD-10-CM | POA: Diagnosis not present

## 2016-10-13 DIAGNOSIS — F0151 Vascular dementia with behavioral disturbance: Secondary | ICD-10-CM | POA: Diagnosis not present

## 2016-10-13 DIAGNOSIS — E1142 Type 2 diabetes mellitus with diabetic polyneuropathy: Secondary | ICD-10-CM | POA: Diagnosis not present

## 2016-10-13 DIAGNOSIS — S71101D Unspecified open wound, right thigh, subsequent encounter: Secondary | ICD-10-CM | POA: Diagnosis not present

## 2016-10-13 DIAGNOSIS — S71102D Unspecified open wound, left thigh, subsequent encounter: Secondary | ICD-10-CM | POA: Diagnosis not present

## 2016-10-13 DIAGNOSIS — S81001D Unspecified open wound, right knee, subsequent encounter: Secondary | ICD-10-CM | POA: Diagnosis not present

## 2016-10-15 DIAGNOSIS — S71101D Unspecified open wound, right thigh, subsequent encounter: Secondary | ICD-10-CM | POA: Diagnosis not present

## 2016-10-15 DIAGNOSIS — S81002D Unspecified open wound, left knee, subsequent encounter: Secondary | ICD-10-CM | POA: Diagnosis not present

## 2016-10-15 DIAGNOSIS — S81001D Unspecified open wound, right knee, subsequent encounter: Secondary | ICD-10-CM | POA: Diagnosis not present

## 2016-10-15 DIAGNOSIS — E1142 Type 2 diabetes mellitus with diabetic polyneuropathy: Secondary | ICD-10-CM | POA: Diagnosis not present

## 2016-10-15 DIAGNOSIS — S71102D Unspecified open wound, left thigh, subsequent encounter: Secondary | ICD-10-CM | POA: Diagnosis not present

## 2016-10-15 DIAGNOSIS — F0151 Vascular dementia with behavioral disturbance: Secondary | ICD-10-CM | POA: Diagnosis not present

## 2016-10-17 DIAGNOSIS — S81001D Unspecified open wound, right knee, subsequent encounter: Secondary | ICD-10-CM | POA: Diagnosis not present

## 2016-10-18 DIAGNOSIS — S71102D Unspecified open wound, left thigh, subsequent encounter: Secondary | ICD-10-CM | POA: Diagnosis not present

## 2016-10-18 DIAGNOSIS — S71101D Unspecified open wound, right thigh, subsequent encounter: Secondary | ICD-10-CM | POA: Diagnosis not present

## 2016-10-18 DIAGNOSIS — S81002D Unspecified open wound, left knee, subsequent encounter: Secondary | ICD-10-CM | POA: Diagnosis not present

## 2016-10-18 DIAGNOSIS — E1142 Type 2 diabetes mellitus with diabetic polyneuropathy: Secondary | ICD-10-CM | POA: Diagnosis not present

## 2016-10-18 DIAGNOSIS — S81001D Unspecified open wound, right knee, subsequent encounter: Secondary | ICD-10-CM | POA: Diagnosis not present

## 2016-10-18 DIAGNOSIS — F0151 Vascular dementia with behavioral disturbance: Secondary | ICD-10-CM | POA: Diagnosis not present

## 2016-10-19 ENCOUNTER — Encounter: Payer: Self-pay | Admitting: *Deleted

## 2016-10-19 ENCOUNTER — Other Ambulatory Visit: Payer: Self-pay

## 2016-10-19 DIAGNOSIS — S81001D Unspecified open wound, right knee, subsequent encounter: Secondary | ICD-10-CM | POA: Diagnosis not present

## 2016-10-19 DIAGNOSIS — S71102D Unspecified open wound, left thigh, subsequent encounter: Secondary | ICD-10-CM | POA: Diagnosis not present

## 2016-10-19 DIAGNOSIS — S71101D Unspecified open wound, right thigh, subsequent encounter: Secondary | ICD-10-CM | POA: Diagnosis not present

## 2016-10-19 DIAGNOSIS — F0151 Vascular dementia with behavioral disturbance: Secondary | ICD-10-CM | POA: Diagnosis not present

## 2016-10-19 DIAGNOSIS — E1142 Type 2 diabetes mellitus with diabetic polyneuropathy: Secondary | ICD-10-CM | POA: Diagnosis not present

## 2016-10-19 DIAGNOSIS — Z006 Encounter for examination for normal comparison and control in clinical research program: Secondary | ICD-10-CM

## 2016-10-19 DIAGNOSIS — S81002D Unspecified open wound, left knee, subsequent encounter: Secondary | ICD-10-CM | POA: Diagnosis not present

## 2016-10-19 NOTE — Progress Notes (Signed)
Called Harry [name] for his 2 year SAFE-DCB registry follow-up. He states he has not had any additional interventions to his right leg since we last spoke. He had no questions. I will call in 1 year for the final follow-up.

## 2016-10-22 ENCOUNTER — Ambulatory Visit: Payer: Medicare Other | Admitting: Diagnostic Neuroimaging

## 2016-10-22 DIAGNOSIS — E1142 Type 2 diabetes mellitus with diabetic polyneuropathy: Secondary | ICD-10-CM | POA: Diagnosis not present

## 2016-10-22 DIAGNOSIS — S81001D Unspecified open wound, right knee, subsequent encounter: Secondary | ICD-10-CM | POA: Diagnosis not present

## 2016-10-22 DIAGNOSIS — S71102D Unspecified open wound, left thigh, subsequent encounter: Secondary | ICD-10-CM | POA: Diagnosis not present

## 2016-10-22 DIAGNOSIS — S81002D Unspecified open wound, left knee, subsequent encounter: Secondary | ICD-10-CM | POA: Diagnosis not present

## 2016-10-22 DIAGNOSIS — F0151 Vascular dementia with behavioral disturbance: Secondary | ICD-10-CM | POA: Diagnosis not present

## 2016-10-22 DIAGNOSIS — S71101D Unspecified open wound, right thigh, subsequent encounter: Secondary | ICD-10-CM | POA: Diagnosis not present

## 2016-10-23 ENCOUNTER — Encounter: Payer: Self-pay | Admitting: Diagnostic Neuroimaging

## 2016-10-25 DIAGNOSIS — S81002D Unspecified open wound, left knee, subsequent encounter: Secondary | ICD-10-CM | POA: Diagnosis not present

## 2016-10-25 DIAGNOSIS — S71101D Unspecified open wound, right thigh, subsequent encounter: Secondary | ICD-10-CM | POA: Diagnosis not present

## 2016-10-25 DIAGNOSIS — S71102D Unspecified open wound, left thigh, subsequent encounter: Secondary | ICD-10-CM | POA: Diagnosis not present

## 2016-10-25 DIAGNOSIS — E1142 Type 2 diabetes mellitus with diabetic polyneuropathy: Secondary | ICD-10-CM | POA: Diagnosis not present

## 2016-10-25 DIAGNOSIS — S81001D Unspecified open wound, right knee, subsequent encounter: Secondary | ICD-10-CM | POA: Diagnosis not present

## 2016-10-25 DIAGNOSIS — F0151 Vascular dementia with behavioral disturbance: Secondary | ICD-10-CM | POA: Diagnosis not present

## 2016-10-26 ENCOUNTER — Other Ambulatory Visit: Payer: Self-pay

## 2016-10-26 DIAGNOSIS — S81001D Unspecified open wound, right knee, subsequent encounter: Secondary | ICD-10-CM | POA: Diagnosis not present

## 2016-10-26 DIAGNOSIS — F0151 Vascular dementia with behavioral disturbance: Secondary | ICD-10-CM | POA: Diagnosis not present

## 2016-10-26 DIAGNOSIS — S71102D Unspecified open wound, left thigh, subsequent encounter: Secondary | ICD-10-CM | POA: Diagnosis not present

## 2016-10-26 DIAGNOSIS — S71101D Unspecified open wound, right thigh, subsequent encounter: Secondary | ICD-10-CM | POA: Diagnosis not present

## 2016-10-26 DIAGNOSIS — S81002D Unspecified open wound, left knee, subsequent encounter: Secondary | ICD-10-CM | POA: Diagnosis not present

## 2016-10-26 DIAGNOSIS — E1142 Type 2 diabetes mellitus with diabetic polyneuropathy: Secondary | ICD-10-CM | POA: Diagnosis not present

## 2016-10-26 NOTE — Patient Outreach (Signed)
[facility]) Care Management  10/26/2016   Harry [name] 03/22/1945 [id number]  Subjective:  I feel real good.  Objective:  Patient with history of diabetes, dementia and recent hospitalization after falling outside with injury as a result of fall.  Current Medications:  Current Outpatient Prescriptions  Medication Sig Dispense Refill  . acetaminophen (TYLENOL) 500 MG tablet Take 1,000 mg by mouth every 6 (six) hours as needed (fever, headaches, or pain).     [name] Kitchen amLODipine (NORVASC) 5 MG tablet Take 2.5 mg by mouth daily.    [name] Kitchen aspirin 81 MG EC tablet Take 1 tablet (81 mg total) by mouth daily.    . clopidogrel (PLAVIX) 75 MG tablet TAKE 1 TABLET (75 MG TOTAL) BY MOUTH DAILY WITH BREAKFAST. 90 tablet 2  . ferrous sulfate 325 (65 FE) MG tablet Take 1 tablet (325 mg total) by mouth 2 (two) times daily with a meal. 60 tablet 1  . gabapentin (NEURONTIN) 300 MG capsule Take 300 mg by mouth 2 (two) times daily. Reported on 02/25/2015  1  . Insulin Glargine (BASAGLAR KWIKPEN) 100 UNIT/ML SOPN Inject 20 Units into the skin 2 (two) times daily.     . metoprolol (LOPRESSOR) 50 MG tablet Take 1 tablet (50 mg total) by mouth 2 (two) times daily. 60 tablet 0  . polyvinyl alcohol (ARTIFICIAL TEARS) 1.4 % ophthalmic solution Place 1 drop into both eyes 2 (two) times daily as needed for dry eyes.     . rosuvastatin (CRESTOR) 5 MG tablet TAKE ONE TABLET BY MOUTH DAILY AT 6PM (Patient taking differently: Take 5 mg by mouth in the evening) 30 tablet 1  . sertraline (ZOLOFT) 25 MG tablet Take 1 tablet (25 mg total) by mouth daily. 30 tablet 12  . Vitamin D, Ergocalciferol, (DRISDOL) 50000 units CAPS capsule Take 50,000 Units by mouth every Saturday.     No current facility-administered medications for this visit.     Functional Status:  In your present state of health, do you have any difficulty performing the following activities: 10/02/2016 09/19/2016  Hearing? N N  Vision? Y Y   Comment - -  Difficulty concentrating or making decisions? Harry [name]  Walking or climbing stairs? Y Y  Dressing or bathing? N N  Doing errands, shopping? Harry [name]  Preparing Food and eating ? Y -  Using the Toilet? N -  In the past six months, have you accidently leaked urine? N -  Do you have problems with loss of bowel control? N -  Managing your Medications? Y -  Managing your Finances? Y -  Housekeeping or managing your Housekeeping? Y -  Some recent data might be hidden    Fall/Depression Screening: Fall Risk  10/02/2016 07/13/2016 11/11/2015  Falls in the past year? Yes Yes Yes  Comment - - -  Number falls in past yr: 2 or more 1 2 or more  Comment - - -  Injury with Fall? Yes No No  Risk Factor Category  High Fall Risk - -  Risk for fall due to : History of fall(s);Impaired balance/gait;Impaired mobility;Impaired vision;Medication side effect History of fall(s);Impaired balance/gait;Impaired mobility;Impaired vision Impaired mobility  Risk for fall due to: Comment - - "problems with legs"  Follow up Falls evaluation completed;Education provided;Falls prevention discussed Falls prevention discussed;Education provided -   PHQ 2/9 Scores 10/02/2016 07/13/2016  PHQ - 2 Score 0 0   [facility] CM Care Plan Problem One     Most Recent Value  Care Plan Problem One  Patient was recently discharged from the acute care setting  Role Documenting the Problem One  Care Management [place] for Problem One  Active  [facility] Long Term Goal   Patient will have no acute care admission in the next 31 days  [facility] Long Term Goal Start Date  09/24/16  [facility] Long Term Goal Met Date  10/26/16  Interventions for Problem One Long Term Goal  8/24  patient reports not having any acute care admissions during this assessment period  [facility] CM Short Term Goal #1   In the next 28 days, patient will have 100% attendance with medical and medicsl disciple appointmnets  [facility] CM Short Term Goal #1 Start Date  09/24/16  [facility] CM  Short Term Goal #1 Met Date  10/26/16  Interventions for Short Term Goal #1  8/24 telphone assessment, patient reports being in attendanceto 100% of her medical appointments    [facility] CM Care Plan Problem Two     Most Recent Value  Care Plan Problem Two  Patient needs dental care  Role Documenting the Problem Two  Care Management [place] for Problem Two  Active  [facility] CM Short Term Goal #1   Patient will meet with [facility] LCSW to assess community resources for affordable dental care   [facility] CM Short Term Goal #1 Start Date  09/24/16  [facility] CM Short Term Goal #1 Met Date   10/02/16  Interventions for Short Term Goal #2   7/31 referral sent for  consult for LCSW consult to assist with finding  low cost dental options     Assessment:  Patient has met his case management goals  Patient denies any further case management goals at this time. Patient remains in the home with his wife who is also with [facility]. [place] daughter, son in law and wife. Patient and his wife have an apartment in the same community, however, couple is taking their time in moving in due to both patient and his wife being ill.  Plan:  Telephone contact in the next 28 days for community care coordination.

## 2016-10-29 DIAGNOSIS — S81002D Unspecified open wound, left knee, subsequent encounter: Secondary | ICD-10-CM | POA: Diagnosis not present

## 2016-10-29 DIAGNOSIS — F0151 Vascular dementia with behavioral disturbance: Secondary | ICD-10-CM | POA: Diagnosis not present

## 2016-10-29 DIAGNOSIS — S71101D Unspecified open wound, right thigh, subsequent encounter: Secondary | ICD-10-CM | POA: Diagnosis not present

## 2016-10-29 DIAGNOSIS — S71102D Unspecified open wound, left thigh, subsequent encounter: Secondary | ICD-10-CM | POA: Diagnosis not present

## 2016-10-29 DIAGNOSIS — E1142 Type 2 diabetes mellitus with diabetic polyneuropathy: Secondary | ICD-10-CM | POA: Diagnosis not present

## 2016-10-29 DIAGNOSIS — S81001D Unspecified open wound, right knee, subsequent encounter: Secondary | ICD-10-CM | POA: Diagnosis not present

## 2016-11-02 DIAGNOSIS — S81001D Unspecified open wound, right knee, subsequent encounter: Secondary | ICD-10-CM | POA: Diagnosis not present

## 2016-11-02 DIAGNOSIS — S71101D Unspecified open wound, right thigh, subsequent encounter: Secondary | ICD-10-CM | POA: Diagnosis not present

## 2016-11-02 DIAGNOSIS — S71102D Unspecified open wound, left thigh, subsequent encounter: Secondary | ICD-10-CM | POA: Diagnosis not present

## 2016-11-02 DIAGNOSIS — E1142 Type 2 diabetes mellitus with diabetic polyneuropathy: Secondary | ICD-10-CM | POA: Diagnosis not present

## 2016-11-02 DIAGNOSIS — S81002D Unspecified open wound, left knee, subsequent encounter: Secondary | ICD-10-CM | POA: Diagnosis not present

## 2016-11-02 DIAGNOSIS — F0151 Vascular dementia with behavioral disturbance: Secondary | ICD-10-CM | POA: Diagnosis not present

## 2016-11-06 DIAGNOSIS — E1142 Type 2 diabetes mellitus with diabetic polyneuropathy: Secondary | ICD-10-CM | POA: Diagnosis not present

## 2016-11-06 DIAGNOSIS — S81002D Unspecified open wound, left knee, subsequent encounter: Secondary | ICD-10-CM | POA: Diagnosis not present

## 2016-11-06 DIAGNOSIS — S71102D Unspecified open wound, left thigh, subsequent encounter: Secondary | ICD-10-CM | POA: Diagnosis not present

## 2016-11-06 DIAGNOSIS — S81001D Unspecified open wound, right knee, subsequent encounter: Secondary | ICD-10-CM | POA: Diagnosis not present

## 2016-11-06 DIAGNOSIS — F0151 Vascular dementia with behavioral disturbance: Secondary | ICD-10-CM | POA: Diagnosis not present

## 2016-11-06 DIAGNOSIS — S71101D Unspecified open wound, right thigh, subsequent encounter: Secondary | ICD-10-CM | POA: Diagnosis not present

## 2016-11-08 DIAGNOSIS — S71101D Unspecified open wound, right thigh, subsequent encounter: Secondary | ICD-10-CM | POA: Diagnosis not present

## 2016-11-08 DIAGNOSIS — S81001D Unspecified open wound, right knee, subsequent encounter: Secondary | ICD-10-CM | POA: Diagnosis not present

## 2016-11-08 DIAGNOSIS — S81002D Unspecified open wound, left knee, subsequent encounter: Secondary | ICD-10-CM | POA: Diagnosis not present

## 2016-11-08 DIAGNOSIS — Z6828 Body mass index (BMI) 28.0-28.9, adult: Secondary | ICD-10-CM | POA: Diagnosis not present

## 2016-11-08 DIAGNOSIS — I129 Hypertensive chronic kidney disease with stage 1 through stage 4 chronic kidney disease, or unspecified chronic kidney disease: Secondary | ICD-10-CM | POA: Diagnosis not present

## 2016-11-08 DIAGNOSIS — E1142 Type 2 diabetes mellitus with diabetic polyneuropathy: Secondary | ICD-10-CM | POA: Diagnosis not present

## 2016-11-08 DIAGNOSIS — N183 Chronic kidney disease, stage 3 (moderate): Secondary | ICD-10-CM | POA: Diagnosis not present

## 2016-11-08 DIAGNOSIS — S71102D Unspecified open wound, left thigh, subsequent encounter: Secondary | ICD-10-CM | POA: Diagnosis not present

## 2016-11-08 DIAGNOSIS — D631 Anemia in chronic kidney disease: Secondary | ICD-10-CM | POA: Diagnosis not present

## 2016-11-08 DIAGNOSIS — T24019A Burn of unspecified degree of unspecified thigh, initial encounter: Secondary | ICD-10-CM | POA: Diagnosis not present

## 2016-11-08 DIAGNOSIS — F0390 Unspecified dementia without behavioral disturbance: Secondary | ICD-10-CM | POA: Diagnosis not present

## 2016-11-08 DIAGNOSIS — F0151 Vascular dementia with behavioral disturbance: Secondary | ICD-10-CM | POA: Diagnosis not present

## 2016-11-10 DIAGNOSIS — S71101D Unspecified open wound, right thigh, subsequent encounter: Secondary | ICD-10-CM | POA: Diagnosis not present

## 2016-11-10 DIAGNOSIS — S81002D Unspecified open wound, left knee, subsequent encounter: Secondary | ICD-10-CM | POA: Diagnosis not present

## 2016-11-10 DIAGNOSIS — F0151 Vascular dementia with behavioral disturbance: Secondary | ICD-10-CM | POA: Diagnosis not present

## 2016-11-10 DIAGNOSIS — S81001D Unspecified open wound, right knee, subsequent encounter: Secondary | ICD-10-CM | POA: Diagnosis not present

## 2016-11-10 DIAGNOSIS — E1142 Type 2 diabetes mellitus with diabetic polyneuropathy: Secondary | ICD-10-CM | POA: Diagnosis not present

## 2016-11-10 DIAGNOSIS — S71102D Unspecified open wound, left thigh, subsequent encounter: Secondary | ICD-10-CM | POA: Diagnosis not present

## 2016-11-12 DIAGNOSIS — S71101D Unspecified open wound, right thigh, subsequent encounter: Secondary | ICD-10-CM | POA: Diagnosis not present

## 2016-11-12 DIAGNOSIS — F0151 Vascular dementia with behavioral disturbance: Secondary | ICD-10-CM | POA: Diagnosis not present

## 2016-11-12 DIAGNOSIS — S71102D Unspecified open wound, left thigh, subsequent encounter: Secondary | ICD-10-CM | POA: Diagnosis not present

## 2016-11-12 DIAGNOSIS — E1142 Type 2 diabetes mellitus with diabetic polyneuropathy: Secondary | ICD-10-CM | POA: Diagnosis not present

## 2016-11-12 DIAGNOSIS — S81002D Unspecified open wound, left knee, subsequent encounter: Secondary | ICD-10-CM | POA: Diagnosis not present

## 2016-11-12 DIAGNOSIS — S81001D Unspecified open wound, right knee, subsequent encounter: Secondary | ICD-10-CM | POA: Diagnosis not present

## 2016-11-13 DIAGNOSIS — R9431 Abnormal electrocardiogram [ECG] [EKG]: Secondary | ICD-10-CM | POA: Diagnosis not present

## 2016-11-13 DIAGNOSIS — T24311A Burn of third degree of right thigh, initial encounter: Secondary | ICD-10-CM | POA: Diagnosis not present

## 2016-11-13 DIAGNOSIS — R4182 Altered mental status, unspecified: Secondary | ICD-10-CM | POA: Diagnosis not present

## 2016-11-13 DIAGNOSIS — T24321A Burn of third degree of right knee, initial encounter: Secondary | ICD-10-CM | POA: Diagnosis not present

## 2016-11-13 DIAGNOSIS — F0390 Unspecified dementia without behavioral disturbance: Secondary | ICD-10-CM | POA: Diagnosis not present

## 2016-11-13 DIAGNOSIS — D638 Anemia in other chronic diseases classified elsewhere: Secondary | ICD-10-CM | POA: Diagnosis present

## 2016-11-13 DIAGNOSIS — E1151 Type 2 diabetes mellitus with diabetic peripheral angiopathy without gangrene: Secondary | ICD-10-CM | POA: Diagnosis present

## 2016-11-13 DIAGNOSIS — T24291A Burn of second degree of multiple sites of right lower limb, except ankle and foot, initial encounter: Secondary | ICD-10-CM | POA: Diagnosis not present

## 2016-11-13 DIAGNOSIS — H5442A5 Blindness left eye category 5, normal vision right eye: Secondary | ICD-10-CM | POA: Diagnosis not present

## 2016-11-13 DIAGNOSIS — F05 Delirium due to known physiological condition: Secondary | ICD-10-CM | POA: Diagnosis not present

## 2016-11-13 DIAGNOSIS — E1122 Type 2 diabetes mellitus with diabetic chronic kidney disease: Secondary | ICD-10-CM | POA: Diagnosis present

## 2016-11-13 DIAGNOSIS — N390 Urinary tract infection, site not specified: Secondary | ICD-10-CM | POA: Diagnosis not present

## 2016-11-13 DIAGNOSIS — H40002 Preglaucoma, unspecified, left eye: Secondary | ICD-10-CM | POA: Diagnosis present

## 2016-11-13 DIAGNOSIS — T24312D Burn of third degree of left thigh, subsequent encounter: Secondary | ICD-10-CM | POA: Diagnosis not present

## 2016-11-13 DIAGNOSIS — E11649 Type 2 diabetes mellitus with hypoglycemia without coma: Secondary | ICD-10-CM | POA: Diagnosis not present

## 2016-11-13 DIAGNOSIS — I129 Hypertensive chronic kidney disease with stage 1 through stage 4 chronic kidney disease, or unspecified chronic kidney disease: Secondary | ICD-10-CM | POA: Diagnosis present

## 2016-11-13 DIAGNOSIS — E119 Type 2 diabetes mellitus without complications: Secondary | ICD-10-CM | POA: Diagnosis not present

## 2016-11-13 DIAGNOSIS — F0151 Vascular dementia with behavioral disturbance: Secondary | ICD-10-CM | POA: Diagnosis present

## 2016-11-13 DIAGNOSIS — E1165 Type 2 diabetes mellitus with hyperglycemia: Secondary | ICD-10-CM | POA: Diagnosis present

## 2016-11-13 DIAGNOSIS — L03115 Cellulitis of right lower limb: Secondary | ICD-10-CM | POA: Diagnosis present

## 2016-11-13 DIAGNOSIS — E1169 Type 2 diabetes mellitus with other specified complication: Secondary | ICD-10-CM | POA: Diagnosis not present

## 2016-11-13 DIAGNOSIS — R569 Unspecified convulsions: Secondary | ICD-10-CM | POA: Diagnosis not present

## 2016-11-13 DIAGNOSIS — R918 Other nonspecific abnormal finding of lung field: Secondary | ICD-10-CM | POA: Diagnosis not present

## 2016-11-13 DIAGNOSIS — B964 Proteus (mirabilis) (morganii) as the cause of diseases classified elsewhere: Secondary | ICD-10-CM | POA: Diagnosis not present

## 2016-11-13 DIAGNOSIS — Z7982 Long term (current) use of aspirin: Secondary | ICD-10-CM | POA: Diagnosis not present

## 2016-11-13 DIAGNOSIS — F329 Major depressive disorder, single episode, unspecified: Secondary | ICD-10-CM | POA: Diagnosis present

## 2016-11-13 DIAGNOSIS — F17210 Nicotine dependence, cigarettes, uncomplicated: Secondary | ICD-10-CM | POA: Diagnosis present

## 2016-11-13 DIAGNOSIS — H409 Unspecified glaucoma: Secondary | ICD-10-CM | POA: Diagnosis not present

## 2016-11-13 DIAGNOSIS — Z01818 Encounter for other preprocedural examination: Secondary | ICD-10-CM | POA: Diagnosis not present

## 2016-11-13 DIAGNOSIS — T24391A Burn of third degree of multiple sites of right lower limb, except ankle and foot, initial encounter: Secondary | ICD-10-CM | POA: Diagnosis not present

## 2016-11-13 DIAGNOSIS — R404 Transient alteration of awareness: Secondary | ICD-10-CM | POA: Diagnosis not present

## 2016-11-13 DIAGNOSIS — T24202A Burn of second degree of unspecified site of left lower limb, except ankle and foot, initial encounter: Secondary | ICD-10-CM | POA: Diagnosis not present

## 2016-11-13 DIAGNOSIS — T24302A Burn of third degree of unspecified site of left lower limb, except ankle and foot, initial encounter: Secondary | ICD-10-CM | POA: Diagnosis not present

## 2016-11-13 DIAGNOSIS — N183 Chronic kidney disease, stage 3 (moderate): Secondary | ICD-10-CM | POA: Diagnosis present

## 2016-11-13 DIAGNOSIS — T24391D Burn of third degree of multiple sites of right lower limb, except ankle and foot, subsequent encounter: Secondary | ICD-10-CM | POA: Diagnosis not present

## 2016-11-13 DIAGNOSIS — I10 Essential (primary) hypertension: Secondary | ICD-10-CM | POA: Diagnosis not present

## 2016-11-13 DIAGNOSIS — Z7409 Other reduced mobility: Secondary | ICD-10-CM | POA: Diagnosis present

## 2016-11-13 DIAGNOSIS — T24322A Burn of third degree of left knee, initial encounter: Secondary | ICD-10-CM | POA: Diagnosis not present

## 2016-11-13 DIAGNOSIS — T24331A Burn of third degree of right lower leg, initial encounter: Secondary | ICD-10-CM | POA: Diagnosis not present

## 2016-11-13 DIAGNOSIS — E785 Hyperlipidemia, unspecified: Secondary | ICD-10-CM | POA: Diagnosis present

## 2016-11-13 DIAGNOSIS — T24392A Burn of third degree of multiple sites of left lower limb, except ankle and foot, initial encounter: Secondary | ICD-10-CM | POA: Diagnosis not present

## 2016-11-13 DIAGNOSIS — F0391 Unspecified dementia with behavioral disturbance: Secondary | ICD-10-CM | POA: Diagnosis not present

## 2016-11-13 DIAGNOSIS — I739 Peripheral vascular disease, unspecified: Secondary | ICD-10-CM | POA: Diagnosis not present

## 2016-11-13 DIAGNOSIS — T310 Burns involving less than 10% of body surface: Secondary | ICD-10-CM | POA: Diagnosis not present

## 2016-11-13 DIAGNOSIS — T24392D Burn of third degree of multiple sites of left lower limb, except ankle and foot, subsequent encounter: Secondary | ICD-10-CM | POA: Diagnosis not present

## 2016-11-13 DIAGNOSIS — E11319 Type 2 diabetes mellitus with unspecified diabetic retinopathy without macular edema: Secondary | ICD-10-CM | POA: Insufficient documentation

## 2016-11-13 DIAGNOSIS — R55 Syncope and collapse: Secondary | ICD-10-CM | POA: Diagnosis not present

## 2016-11-13 DIAGNOSIS — J984 Other disorders of lung: Secondary | ICD-10-CM | POA: Diagnosis not present

## 2016-11-13 DIAGNOSIS — T24312A Burn of third degree of left thigh, initial encounter: Secondary | ICD-10-CM | POA: Diagnosis not present

## 2016-11-13 DIAGNOSIS — Z8673 Personal history of transient ischemic attack (TIA), and cerebral infarction without residual deficits: Secondary | ICD-10-CM | POA: Diagnosis not present

## 2016-11-13 DIAGNOSIS — T24301A Burn of third degree of unspecified site of right lower limb, except ankle and foot, initial encounter: Secondary | ICD-10-CM | POA: Diagnosis not present

## 2016-11-13 DIAGNOSIS — R05 Cough: Secondary | ICD-10-CM | POA: Diagnosis not present

## 2016-11-13 DIAGNOSIS — Z794 Long term (current) use of insulin: Secondary | ICD-10-CM | POA: Diagnosis not present

## 2016-11-13 DIAGNOSIS — F32A Depression, unspecified: Secondary | ICD-10-CM | POA: Insufficient documentation

## 2016-11-13 DIAGNOSIS — T24332A Burn of third degree of left lower leg, initial encounter: Secondary | ICD-10-CM | POA: Diagnosis not present

## 2016-11-13 DIAGNOSIS — L552 Sunburn of third degree: Secondary | ICD-10-CM | POA: Diagnosis present

## 2016-11-13 DIAGNOSIS — H179 Unspecified corneal scar and opacity: Secondary | ICD-10-CM | POA: Diagnosis present

## 2016-11-13 DIAGNOSIS — L03116 Cellulitis of left lower limb: Secondary | ICD-10-CM | POA: Diagnosis present

## 2016-11-14 ENCOUNTER — Telehealth: Payer: Self-pay | Admitting: Cardiovascular Disease
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
## 2016-11-16 ENCOUNTER — Other Ambulatory Visit: Payer: Self-pay

## 2016-11-16 ENCOUNTER — Ambulatory Visit: Payer: Medicare Other | Admitting: Cardiovascular Disease
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
## 2016-11-22 DIAGNOSIS — R569 Unspecified convulsions: Secondary | ICD-10-CM | POA: Diagnosis not present

## 2016-11-23 ENCOUNTER — Encounter (HOSPITAL_BASED_OUTPATIENT_CLINIC_OR_DEPARTMENT_OTHER): Payer: Medicare Other | Attending: Internal Medicine

## 2016-12-03 ENCOUNTER — Ambulatory Visit: Payer: Self-pay

## 2016-12-03 ENCOUNTER — Other Ambulatory Visit: Payer: Self-pay
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
## 2016-12-20 ENCOUNTER — Other Ambulatory Visit: Payer: Self-pay | Admitting: Cardiovascular Disease

## 2016-12-20 DIAGNOSIS — T24312D Burn of third degree of left thigh, subsequent encounter: Secondary | ICD-10-CM | POA: Diagnosis not present

## 2016-12-20 DIAGNOSIS — T24311D Burn of third degree of right thigh, subsequent encounter: Secondary | ICD-10-CM | POA: Diagnosis not present

## 2016-12-20 NOTE — Telephone Encounter (Signed)
REFILL 

## 2016-12-22 DIAGNOSIS — T24311D Burn of third degree of right thigh, subsequent encounter: Secondary | ICD-10-CM | POA: Diagnosis not present

## 2016-12-22 DIAGNOSIS — T24312D Burn of third degree of left thigh, subsequent encounter: Secondary | ICD-10-CM | POA: Diagnosis not present

## 2016-12-24 DIAGNOSIS — T24311D Burn of third degree of right thigh, subsequent encounter: Secondary | ICD-10-CM | POA: Diagnosis not present

## 2016-12-24 DIAGNOSIS — T24312D Burn of third degree of left thigh, subsequent encounter: Secondary | ICD-10-CM | POA: Diagnosis not present

## 2016-12-26 DIAGNOSIS — T24312D Burn of third degree of left thigh, subsequent encounter: Secondary | ICD-10-CM | POA: Diagnosis not present

## 2016-12-26 DIAGNOSIS — T24311D Burn of third degree of right thigh, subsequent encounter: Secondary | ICD-10-CM | POA: Diagnosis not present

## 2016-12-27 DIAGNOSIS — N179 Acute kidney failure, unspecified: Secondary | ICD-10-CM | POA: Diagnosis not present

## 2016-12-27 DIAGNOSIS — I129 Hypertensive chronic kidney disease with stage 1 through stage 4 chronic kidney disease, or unspecified chronic kidney disease: Secondary | ICD-10-CM | POA: Diagnosis not present

## 2016-12-27 DIAGNOSIS — Z7982 Long term (current) use of aspirin: Secondary | ICD-10-CM | POA: Diagnosis not present

## 2016-12-27 DIAGNOSIS — Z945 Skin transplant status: Secondary | ICD-10-CM | POA: Diagnosis not present

## 2016-12-27 DIAGNOSIS — E1151 Type 2 diabetes mellitus with diabetic peripheral angiopathy without gangrene: Secondary | ICD-10-CM | POA: Diagnosis not present

## 2016-12-27 DIAGNOSIS — D638 Anemia in other chronic diseases classified elsewhere: Secondary | ICD-10-CM | POA: Diagnosis not present

## 2016-12-27 DIAGNOSIS — L551 Sunburn of second degree: Secondary | ICD-10-CM | POA: Diagnosis not present

## 2016-12-27 DIAGNOSIS — F0390 Unspecified dementia without behavioral disturbance: Secondary | ICD-10-CM | POA: Diagnosis not present

## 2016-12-27 DIAGNOSIS — E11319 Type 2 diabetes mellitus with unspecified diabetic retinopathy without macular edema: Secondary | ICD-10-CM | POA: Diagnosis not present

## 2016-12-27 DIAGNOSIS — Z7902 Long term (current) use of antithrombotics/antiplatelets: Secondary | ICD-10-CM | POA: Diagnosis not present

## 2016-12-27 DIAGNOSIS — T24211D Burn of second degree of right thigh, subsequent encounter: Secondary | ICD-10-CM | POA: Diagnosis not present

## 2016-12-27 DIAGNOSIS — N183 Chronic kidney disease, stage 3 (moderate): Secondary | ICD-10-CM | POA: Diagnosis not present

## 2016-12-27 DIAGNOSIS — T310 Burns involving less than 10% of body surface: Secondary | ICD-10-CM | POA: Diagnosis not present

## 2016-12-27 DIAGNOSIS — E785 Hyperlipidemia, unspecified: Secondary | ICD-10-CM | POA: Diagnosis not present

## 2016-12-27 DIAGNOSIS — E1122 Type 2 diabetes mellitus with diabetic chronic kidney disease: Secondary | ICD-10-CM | POA: Diagnosis not present

## 2016-12-27 DIAGNOSIS — T24212D Burn of second degree of left thigh, subsequent encounter: Secondary | ICD-10-CM | POA: Diagnosis not present

## 2016-12-27 DIAGNOSIS — Z794 Long term (current) use of insulin: Secondary | ICD-10-CM | POA: Diagnosis not present

## 2016-12-27 DIAGNOSIS — Z79899 Other long term (current) drug therapy: Secondary | ICD-10-CM | POA: Diagnosis not present

## 2016-12-28 ENCOUNTER — Other Ambulatory Visit: Payer: Self-pay

## 2016-12-28 DIAGNOSIS — T24312D Burn of third degree of left thigh, subsequent encounter: Secondary | ICD-10-CM | POA: Diagnosis not present

## 2016-12-28 DIAGNOSIS — T24311D Burn of third degree of right thigh, subsequent encounter: Secondary | ICD-10-CM | POA: Diagnosis not present
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
## 2016-12-31 DIAGNOSIS — T24311D Burn of third degree of right thigh, subsequent encounter: Secondary | ICD-10-CM | POA: Diagnosis not present

## 2016-12-31 DIAGNOSIS — T24312D Burn of third degree of left thigh, subsequent encounter: Secondary | ICD-10-CM | POA: Diagnosis not present

## 2017-01-01 ENCOUNTER — Other Ambulatory Visit: Payer: Self-pay

## 2017-01-01 ENCOUNTER — Ambulatory Visit: Payer: Self-pay
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
## 2017-01-02 DIAGNOSIS — T24312D Burn of third degree of left thigh, subsequent encounter: Secondary | ICD-10-CM | POA: Diagnosis not present

## 2017-01-02 DIAGNOSIS — T24311D Burn of third degree of right thigh, subsequent encounter: Secondary | ICD-10-CM | POA: Diagnosis not present

## 2017-01-04 DIAGNOSIS — T24311D Burn of third degree of right thigh, subsequent encounter: Secondary | ICD-10-CM | POA: Diagnosis not present

## 2017-01-07 DIAGNOSIS — T24311D Burn of third degree of right thigh, subsequent encounter: Secondary | ICD-10-CM | POA: Diagnosis not present

## 2017-01-07 DIAGNOSIS — T24312D Burn of third degree of left thigh, subsequent encounter: Secondary | ICD-10-CM | POA: Diagnosis not present

## 2017-01-09 ENCOUNTER — Other Ambulatory Visit: Payer: Self-pay

## 2017-01-09 DIAGNOSIS — E039 Hypothyroidism, unspecified: Secondary | ICD-10-CM | POA: Diagnosis not present

## 2017-01-09 DIAGNOSIS — E113293 Type 2 diabetes mellitus with mild nonproliferative diabetic retinopathy without macular edema, bilateral: Secondary | ICD-10-CM | POA: Diagnosis not present

## 2017-01-09 DIAGNOSIS — E785 Hyperlipidemia, unspecified: Secondary | ICD-10-CM | POA: Diagnosis not present

## 2017-01-09 DIAGNOSIS — E559 Vitamin D deficiency, unspecified: Secondary | ICD-10-CM | POA: Diagnosis not present

## 2017-01-09 DIAGNOSIS — Z0000 Encounter for general adult medical examination without abnormal findings: Secondary | ICD-10-CM | POA: Diagnosis not present

## 2017-01-09 DIAGNOSIS — I10 Essential (primary) hypertension: Secondary | ICD-10-CM | POA: Diagnosis not present

## 2017-01-09 DIAGNOSIS — M5126 Other intervertebral disc displacement, lumbar region: Secondary | ICD-10-CM | POA: Diagnosis not present

## 2017-01-09 DIAGNOSIS — E1165 Type 2 diabetes mellitus with hyperglycemia: Secondary | ICD-10-CM | POA: Diagnosis not present

## 2017-01-09 DIAGNOSIS — Z1389 Encounter for screening for other disorder: Secondary | ICD-10-CM | POA: Diagnosis not present

## 2017-01-09 DIAGNOSIS — I951 Orthostatic hypotension: Secondary | ICD-10-CM | POA: Diagnosis not present
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
## 2017-01-14 DIAGNOSIS — T24311D Burn of third degree of right thigh, subsequent encounter: Secondary | ICD-10-CM | POA: Diagnosis not present

## 2017-01-14 DIAGNOSIS — T24312D Burn of third degree of left thigh, subsequent encounter: Secondary | ICD-10-CM | POA: Diagnosis not present

## 2017-01-15 ENCOUNTER — Other Ambulatory Visit: Payer: Self-pay
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
## 2017-01-16 ENCOUNTER — Other Ambulatory Visit: Payer: Self-pay

## 2017-01-16 NOTE — Patient Outreach (Signed)
[facility]) Care Management  01/16/2017  TYLIN FORCE 1945-09-22 [id number]   Care Coordination: RNCM called primary care office to update regarding home visit completed 01/15/17; CM note was routed to Dr. Legrand Rams this morning; emphasized client's increase blood sugar and blood pressure results. RNCM request callback if any questions.  Plan: continue to follow.  Thea Silversmith, RN, MSN, [place] Coordinator Cell: [phone]

## 2017-01-20 IMAGING — CR DG CHEST 2V
2 series · 2 of 2 positions shown · non-contrast
Comparison: Chest x-ray of 01/10/2011

CLINICAL DATA: Preop for cardiac catheterization, smoking history,
diabetes

EXAM:
CHEST  2 VIEW

[w chest pa]
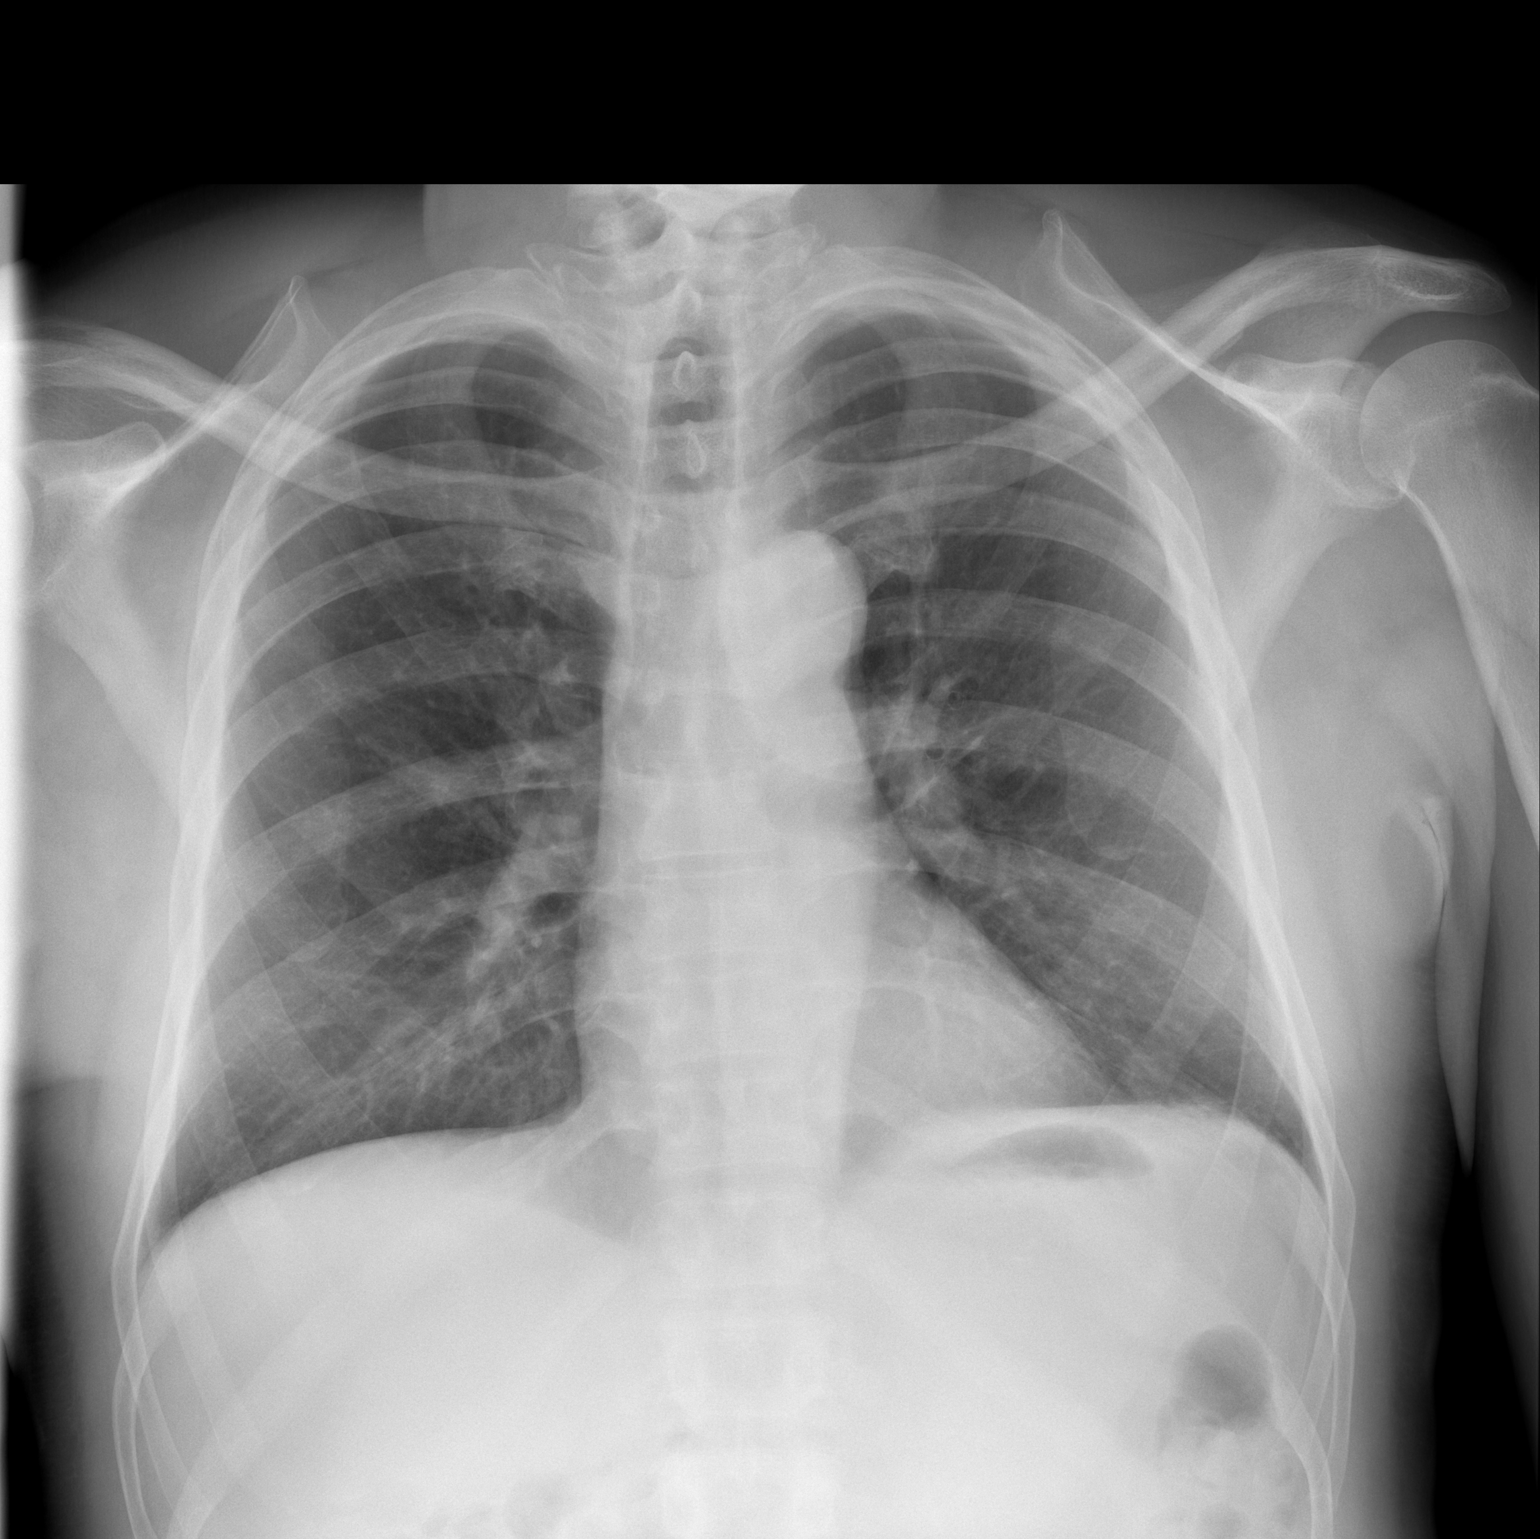

[w chest lat]
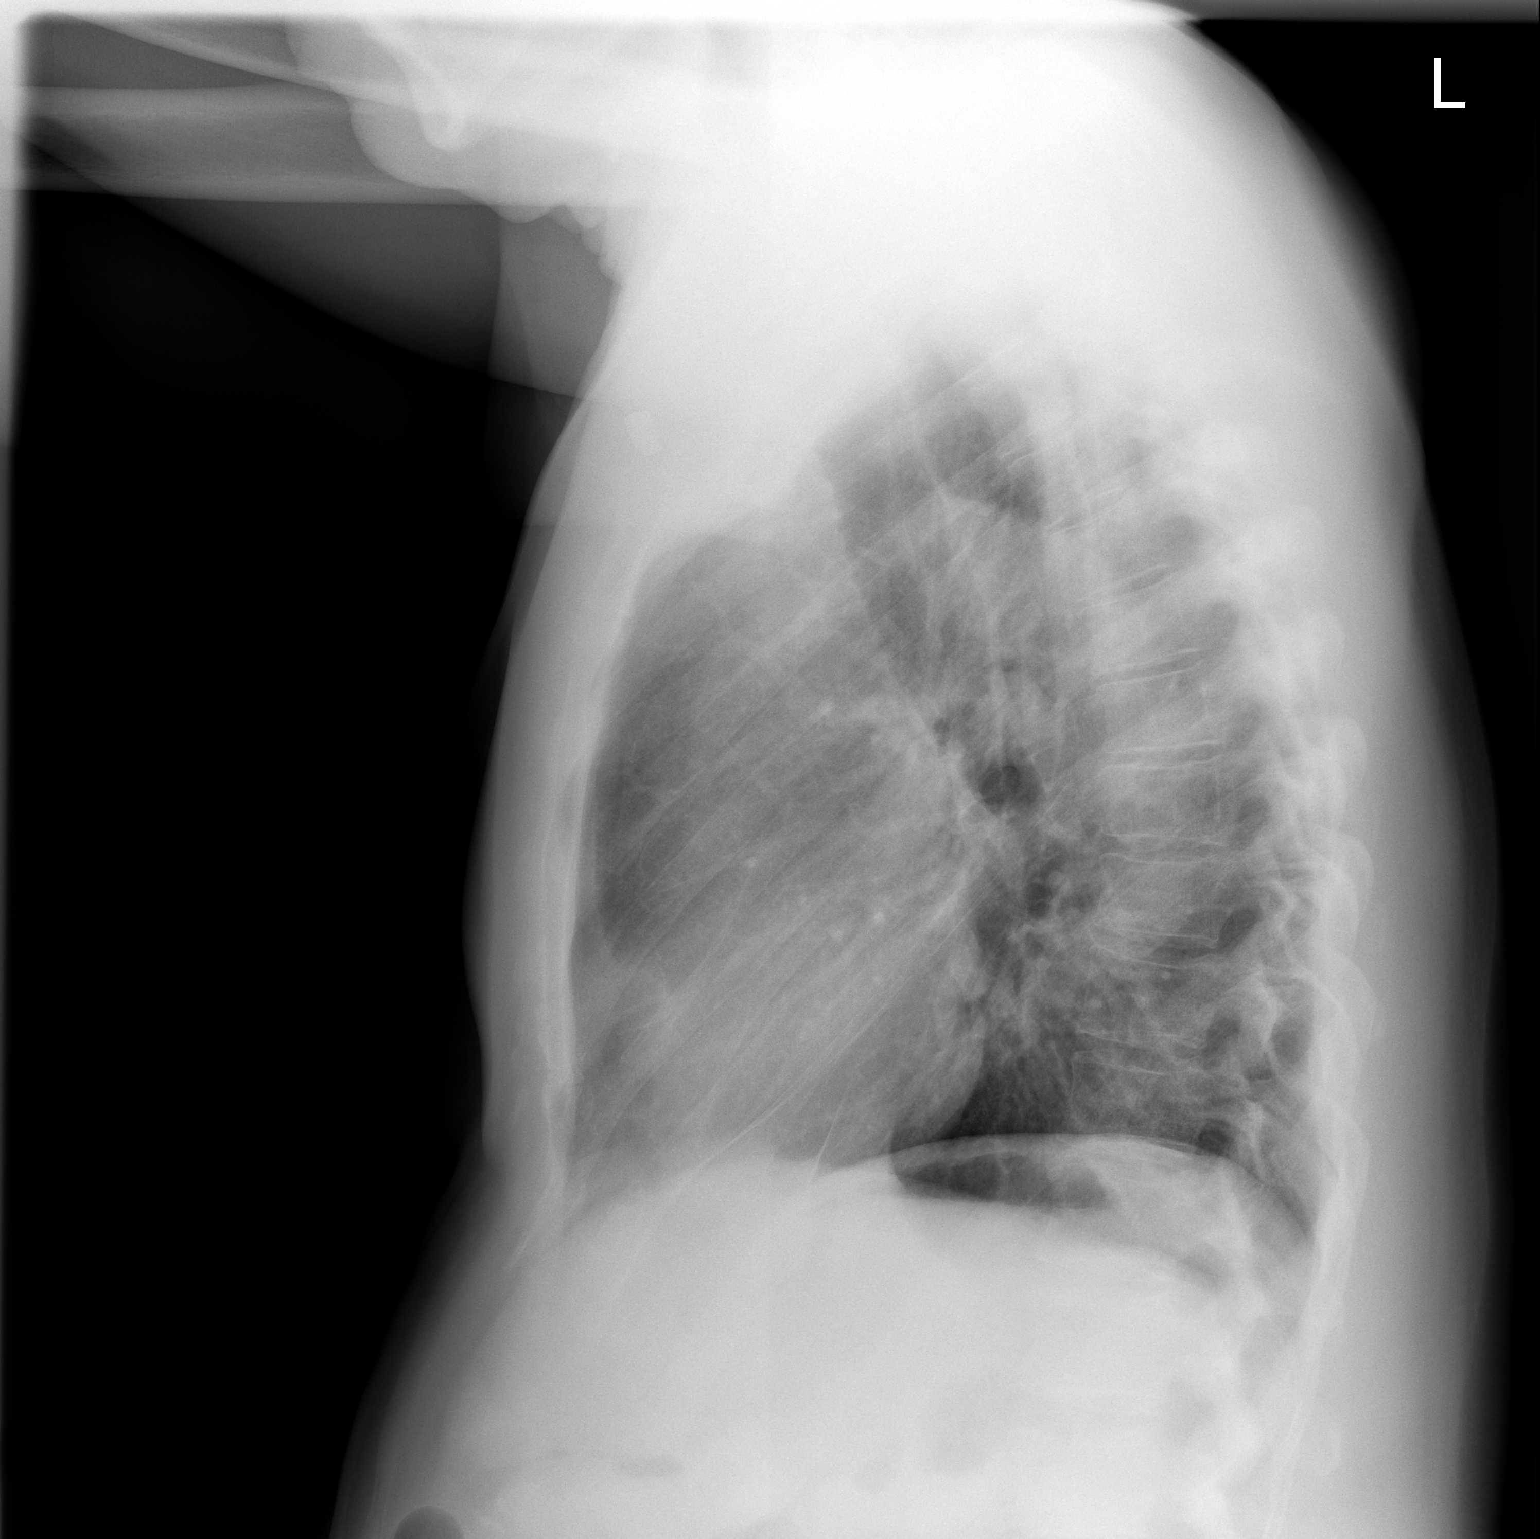

[2 of 2 positions shown; findings below may reference images not displayed]

FINDINGS: No active infiltrate or effusion is seen. Mediastinal and hilar
contours are unremarkable. The heart is within normal limits in
size. No acute bony abnormality is seen.
IMPRESSION: No active cardiopulmonary disease.

## 2017-01-21 ENCOUNTER — Other Ambulatory Visit: Payer: Self-pay

## 2017-01-21 DIAGNOSIS — T24311D Burn of third degree of right thigh, subsequent encounter: Secondary | ICD-10-CM | POA: Diagnosis not present

## 2017-01-21 DIAGNOSIS — T24312D Burn of third degree of left thigh, subsequent encounter: Secondary | ICD-10-CM | POA: Diagnosis not present
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
## 2017-01-28 ENCOUNTER — Other Ambulatory Visit: Payer: Self-pay

## 2017-01-28 ENCOUNTER — Other Ambulatory Visit: Payer: Self-pay | Admitting: Cardiovascular Disease
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
## 2017-01-28 NOTE — Telephone Encounter (Signed)
Rx has been sent to the pharmacy electronically. ° °
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
## 2017-02-01 ENCOUNTER — Encounter: Payer: Self-pay | Admitting: Cardiovascular Disease

## 2017-02-01 ENCOUNTER — Ambulatory Visit (INDEPENDENT_AMBULATORY_CARE_PROVIDER_SITE_OTHER): Payer: Medicare Other | Admitting: Cardiovascular Disease

## 2017-02-01 VITALS — BP 94/72 | HR 86 | Ht 71.0 in | Wt 182.0 lb

## 2017-02-01 DIAGNOSIS — I10 Essential (primary) hypertension: Secondary | ICD-10-CM | POA: Diagnosis not present

## 2017-02-01 DIAGNOSIS — F17200 Nicotine dependence, unspecified, uncomplicated: Secondary | ICD-10-CM

## 2017-02-01 DIAGNOSIS — I739 Peripheral vascular disease, unspecified: Secondary | ICD-10-CM

## 2017-02-01 DIAGNOSIS — E785 Hyperlipidemia, unspecified: Secondary | ICD-10-CM | POA: Diagnosis not present
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
## 2017-02-01 NOTE — Assessment & Plan Note (Signed)
History of peripheral arterial disease status post staged right and left SFA intervention using directional atherectomy and drug-eluting balloon angioplasty in June and July 2017. His most recent lower extremity showed Dopplers performed 09/07/16 revealed a right ABI of 0.52 with an occluded distal right SFA and a left ABI of 0.83. He does have moderate renal insufficiency complains of some "weakness of his right leg although he does not ambulate much greasy give classic symptoms of claudication.

## 2017-02-01 NOTE — Assessment & Plan Note (Signed)
History of essential hypertension blood pressure measured today at 94/72. I am going to stop his lisinopril and his carvedilol. He is on beta blockers including metoprolol.

## 2017-02-01 NOTE — Patient Instructions (Addendum)
Medication Instructions: Your physician recommends that you continue on your current medications as directed. Please refer to the Current Medication list given to you today.  STOP Carvedilol and Lisinopril  Testing: Your physician has requested that you have a lower extremity arterial duplex. During this test, ultrasound is used to evaluate arterial blood flow in the legs. Allow one hour for this exam. There are no restrictions or special instructions.  Your physician has requested that you have an ankle brachial index (ABI). During this test an ultrasound and blood pressure cuff are used to evaluate the arteries that supply the arms and legs with blood. Allow thirty minutes for this exam. There are no restrictions or special instructions. (IN 6 MONTHS)   Follow-Up: Your physician wants you to follow-up in: 6 months with Dr. Gwenlyn Found after dopplersYou will receive a reminder letter in the mail two months in advance. If you don't receive a letter, please call our office to schedule the follow-up appointment.  If you need a refill on your cardiac medications before your next appointment, please call your pharmacy.

## 2017-02-01 NOTE — Assessment & Plan Note (Signed)
History of hyperlipidemia on statin therapy lipid profile performed 05/26/16 revealed a total cholesterol 122, LDL 76 and HDL of 31.

## 2017-02-01 NOTE — Assessment & Plan Note (Signed)
History of continued tobacco abuse recalcitrant to risk factor modification 

## 2017-02-05 ENCOUNTER — Telehealth: Payer: Self-pay | Admitting: Diagnostic Neuroimaging

## 2017-02-05 NOTE — Telephone Encounter (Signed)
-----   Message from [place], Generic sent at 02/05/2017 7:47 AM EST -----    Appointment Request From: Harry [name]    With Provider: Andrey Spearman, MD [[facility]]    Preferred Date Range: Any    Preferred Times: Monday Morning, Monday Afternoon    Reason for visit: Request an Appointment    Comments:  [facility] post hospital follow up
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
## 2017-02-18 ENCOUNTER — Ambulatory Visit (INDEPENDENT_AMBULATORY_CARE_PROVIDER_SITE_OTHER): Payer: Medicare Other | Admitting: Diagnostic Neuroimaging

## 2017-02-18 ENCOUNTER — Encounter: Payer: Self-pay | Admitting: Diagnostic Neuroimaging

## 2017-02-18 ENCOUNTER — Other Ambulatory Visit: Payer: Self-pay

## 2017-02-18 VITALS — BP 88/59 | HR 98 | Wt 183.0 lb

## 2017-02-18 DIAGNOSIS — F0151 Vascular dementia with behavioral disturbance: Secondary | ICD-10-CM | POA: Diagnosis not present

## 2017-02-18 DIAGNOSIS — F01518 Vascular dementia, unspecified severity, with other behavioral disturbance: Secondary | ICD-10-CM

## 2017-02-18 DIAGNOSIS — R269 Unspecified abnormalities of gait and mobility: Secondary | ICD-10-CM

## 2017-02-18 DIAGNOSIS — R55 Syncope and collapse: Secondary | ICD-10-CM

## 2017-02-18 DIAGNOSIS — F0391 Unspecified dementia with behavioral disturbance: Secondary | ICD-10-CM | POA: Diagnosis not present

## 2017-02-18 DIAGNOSIS — F03B18 Unspecified dementia, moderate, with other behavioral disturbance: Secondary | ICD-10-CM
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
## 2017-02-18 NOTE — Patient Instructions (Signed)
DEMENTIA - consider home palliative care consult for advanced care planning - no role for donepezil or memantine at this time due to advanced symptoms and vascular disease - recommend increased safety and supervision at home  SYNCOPE - stay hydrated; monitor BP; follow up with PCP and cardiology

## 2017-02-19 DIAGNOSIS — H40011 Open angle with borderline findings, low risk, right eye: Secondary | ICD-10-CM | POA: Diagnosis not present

## 2017-02-19 DIAGNOSIS — H44512 Absolute glaucoma, left eye: Secondary | ICD-10-CM | POA: Diagnosis not present

## 2017-02-20 ENCOUNTER — Other Ambulatory Visit: Payer: Self-pay

## 2017-02-20 ENCOUNTER — Other Ambulatory Visit: Payer: Self-pay | Admitting: Cardiovascular Disease
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
## 2017-02-22 ENCOUNTER — Other Ambulatory Visit: Payer: Self-pay
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
## 2017-03-08 ENCOUNTER — Other Ambulatory Visit: Payer: Self-pay

## 2017-03-08 NOTE — Patient Outreach (Signed)
[facility]) Care Management  03/08/2017  BLAS RICHES 28-Sep-1945 [id number]   Case closure: Unsuccessful outreach attempts x3, No return call from outreach letter.  Plan: close case.   Thea Silversmith, RN, MSN, [place] Coordinator Cell: [phone]

## 2017-04-15 DIAGNOSIS — E1143 Type 2 diabetes mellitus with diabetic autonomic (poly)neuropathy: Secondary | ICD-10-CM | POA: Diagnosis not present

## 2017-04-15 DIAGNOSIS — I951 Orthostatic hypotension: Secondary | ICD-10-CM | POA: Diagnosis not present

## 2017-04-15 DIAGNOSIS — I739 Peripheral vascular disease, unspecified: Secondary | ICD-10-CM | POA: Diagnosis not present

## 2017-04-15 DIAGNOSIS — F17210 Nicotine dependence, cigarettes, uncomplicated: Secondary | ICD-10-CM | POA: Diagnosis not present

## 2017-04-15 DIAGNOSIS — E785 Hyperlipidemia, unspecified: Secondary | ICD-10-CM | POA: Diagnosis not present

## 2017-04-26 ENCOUNTER — Other Ambulatory Visit: Payer: Self-pay | Admitting: Cardiovascular Disease

## 2017-04-26 NOTE — Telephone Encounter (Signed)
REFILL 

## 2017-04-30 ENCOUNTER — Other Ambulatory Visit: Payer: Self-pay | Admitting: *Deleted

## 2017-04-30 MED ORDER — CLOPIDOGREL BISULFATE 75 MG PO TABS: 75.0000 mg | ORAL_TABLET | Freq: Every day | ORAL | 1 refills | Status: DC

## 2017-05-13 ENCOUNTER — Telehealth: Payer: Self-pay | Admitting: *Deleted

## 2017-05-13 DIAGNOSIS — G629 Polyneuropathy, unspecified: Secondary | ICD-10-CM | POA: Diagnosis not present

## 2017-05-13 DIAGNOSIS — M47816 Spondylosis without myelopathy or radiculopathy, lumbar region: Secondary | ICD-10-CM | POA: Diagnosis not present

## 2017-05-13 DIAGNOSIS — M48061 Spinal stenosis, lumbar region without neurogenic claudication: Secondary | ICD-10-CM | POA: Diagnosis not present

## 2017-05-13 NOTE — Telephone Encounter (Signed)
   [facility] Pre-operative Risk Assessment    Request for surgical clearance:  1. What type of surgery is being performed? Radiofrequency Ablation   2. When is this surgery scheduled? TBD   3. What type of clearance is required (medical clearance vs. Pharmacy clearance to hold med vs. Both)? both  4. Are there any medications that need to be held prior to surgery and how long?Plavix/ASA (7 days)   5. Practice name and name of physician performing surgery? Dr. [facility]   6. What is your office phone and fax number? Fax: [phone], Phone: [phone]   7. Anesthesia type (None, local, MAC, general) ? None listed   Harry [name] 05/13/2017, 5:35 PM  _________________________________________________________________   (provider comments below)

## 2017-05-14 ENCOUNTER — Encounter: Payer: Self-pay | Admitting: Cardiology

## 2017-05-14 NOTE — Telephone Encounter (Signed)
I called pt to see if stable  Left message

## 2017-05-14 NOTE — Telephone Encounter (Signed)
Okay to interrupt antiplatelet therapy for his lumbar injection.

## 2017-05-14 NOTE — Telephone Encounter (Signed)
Ablation is for L4, L5 S1 nerve roots

## 2017-05-14 NOTE — Telephone Encounter (Signed)
Dr. Gwenlyn Found pt needs ablation to Lumbar area nerve roots, can he be off plavix again for 7 days?  Please reply thank you

## 2017-05-15 NOTE — Telephone Encounter (Signed)
   Primary Cardiologist: Dr. Gwenlyn Found  Chart reviewed as part of pre-operative protocol coverage. Given past medical history and time since last visit, based on ACC/AHA guidelines, KYLLE LALL would be at acceptable risk for the planned procedure without further cardiovascular testing.   I will route this recommendation to the requesting party via Epic fax function and remove from pre-op pool.  Please call with questions.  [place], [place] 05/15/2017, 4:16 PM

## 2017-05-20 ENCOUNTER — Telehealth: Payer: Self-pay | Admitting: Cardiovascular Disease

## 2017-05-20 NOTE — Telephone Encounter (Signed)
New message  [facility] calling to request pre op clearance letter be faxed.They do NOT have letter. Please fax ATTN: [facility] name and name of physician performing surgery? Dr. [facility]   What is your office phone and fax number? Fax: [phone], Phone: [phone]

## 2017-05-21 NOTE — Telephone Encounter (Signed)
   Chart reviewed.  Preoperative clearance noted in 05/15/2017 phone note. Not since then because of inadequate information.  Know that we have adequate information, I am sending the previous clearance to Dr. Brien Few with [facility] and closing of the note.  Rosaria Ferries, PA-C 05/21/2017 5:23 PM Beeper [phone]

## 2017-06-18 DIAGNOSIS — M47816 Spondylosis without myelopathy or radiculopathy, lumbar region: Secondary | ICD-10-CM | POA: Diagnosis not present

## 2017-06-26 DIAGNOSIS — M47816 Spondylosis without myelopathy or radiculopathy, lumbar region: Secondary | ICD-10-CM | POA: Diagnosis not present

## 2017-07-01 DIAGNOSIS — D631 Anemia in chronic kidney disease: Secondary | ICD-10-CM | POA: Diagnosis not present

## 2017-07-01 DIAGNOSIS — I739 Peripheral vascular disease, unspecified: Secondary | ICD-10-CM | POA: Diagnosis not present

## 2017-07-01 DIAGNOSIS — I129 Hypertensive chronic kidney disease with stage 1 through stage 4 chronic kidney disease, or unspecified chronic kidney disease: Secondary | ICD-10-CM | POA: Diagnosis not present

## 2017-07-01 DIAGNOSIS — I959 Hypotension, unspecified: Secondary | ICD-10-CM | POA: Diagnosis not present

## 2017-07-01 DIAGNOSIS — N183 Chronic kidney disease, stage 3 (moderate): Secondary | ICD-10-CM | POA: Diagnosis not present

## 2017-07-01 DIAGNOSIS — T24019A Burn of unspecified degree of unspecified thigh, initial encounter: Secondary | ICD-10-CM | POA: Diagnosis not present

## 2017-07-01 DIAGNOSIS — N2581 Secondary hyperparathyroidism of renal origin: Secondary | ICD-10-CM | POA: Diagnosis not present

## 2017-07-01 DIAGNOSIS — F0390 Unspecified dementia without behavioral disturbance: Secondary | ICD-10-CM | POA: Diagnosis not present

## 2017-07-02 DIAGNOSIS — N183 Chronic kidney disease, stage 3 (moderate): Secondary | ICD-10-CM | POA: Diagnosis not present

## 2017-07-15 ENCOUNTER — Other Ambulatory Visit: Payer: Self-pay | Admitting: Diagnostic Neuroimaging

## 2017-07-15 DIAGNOSIS — E785 Hyperlipidemia, unspecified: Secondary | ICD-10-CM | POA: Diagnosis not present

## 2017-07-15 DIAGNOSIS — I951 Orthostatic hypotension: Secondary | ICD-10-CM | POA: Diagnosis not present

## 2017-07-15 DIAGNOSIS — F17210 Nicotine dependence, cigarettes, uncomplicated: Secondary | ICD-10-CM | POA: Diagnosis not present

## 2017-07-15 DIAGNOSIS — E1143 Type 2 diabetes mellitus with diabetic autonomic (poly)neuropathy: Secondary | ICD-10-CM | POA: Diagnosis not present

## 2017-07-15 DIAGNOSIS — F17200 Nicotine dependence, unspecified, uncomplicated: Secondary | ICD-10-CM | POA: Diagnosis not present

## 2017-09-19 ENCOUNTER — Telehealth: Payer: Self-pay | Admitting: *Deleted

## 2017-09-19 NOTE — Telephone Encounter (Signed)
Called Mr. Ploeger for his final SAFE-DCB registry follow-up. Left message.

## 2017-10-09 ENCOUNTER — Ambulatory Visit (INDEPENDENT_AMBULATORY_CARE_PROVIDER_SITE_OTHER): Payer: Medicare Other | Admitting: Cardiovascular Disease

## 2017-10-09 ENCOUNTER — Encounter: Payer: Self-pay | Admitting: Cardiovascular Disease

## 2017-10-09 VITALS — BP 98/66 | HR 95 | Ht 72.0 in | Wt 173.6 lb

## 2017-10-09 DIAGNOSIS — I10 Essential (primary) hypertension: Secondary | ICD-10-CM | POA: Diagnosis not present

## 2017-10-09 DIAGNOSIS — E785 Hyperlipidemia, unspecified: Secondary | ICD-10-CM

## 2017-10-09 DIAGNOSIS — F17200 Nicotine dependence, unspecified, uncomplicated: Secondary | ICD-10-CM | POA: Diagnosis not present

## 2017-10-09 DIAGNOSIS — I739 Peripheral vascular disease, unspecified: Secondary | ICD-10-CM

## 2017-10-09 DIAGNOSIS — Z9862 Peripheral vascular angioplasty status: Secondary | ICD-10-CM | POA: Diagnosis not present

## 2017-10-09 MED ORDER — ROSUVASTATIN CALCIUM 5 MG PO TABS: 5.0000 mg | ORAL_TABLET | Freq: Every day | ORAL | 3 refills | Status: DC

## 2017-10-09 NOTE — Patient Instructions (Signed)
Medication Instructions:   NO CHANGE  Testing/Procedures:  Your physician has requested that you have a lower extremity arterial duplex. This test is an ultrasound of the arteries in the legs or arms. It looks at arterial blood flow in the legs and arms. Allow one hour for Lower and Upper Arterial scans. There are no restrictions or special instructions   Follow-Up:  Your physician wants you to follow-up in: [place] will receive a reminder letter in the mail two months in advance. If you don't receive a letter, please call our office to schedule the follow-up appointment.   If you need a refill on your cardiac medications before your next appointment, please call your pharmacy.

## 2017-10-09 NOTE — Assessment & Plan Note (Signed)
History of ongoing tobacco abuse recalcitrant risk factor modification 
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
## 2017-10-09 NOTE — Assessment & Plan Note (Signed)
History of critical limb ischemia status post left SFA atherectomy and drug-eluting balloon angioplasty 08/29/2014 followed by staged right SFA intervention 09/19/2014.  His most recent lower extremity lateral Dopplers performed 09/07/2016 revealed an occluded right SFA with an ABI of 0.5 to and left ABI 0.83.  He denies claudication.

## 2017-10-09 NOTE — Assessment & Plan Note (Signed)
History of essential hypertension blood pressure measured at 88/59.  His beta-blockers have been discontinued.  He failed a trial of Midodrin which he did not tolerate.

## 2017-10-09 NOTE — Assessment & Plan Note (Signed)
History of hyperlipidemia on statin therapy. 

## 2017-10-11 ENCOUNTER — Ambulatory Visit (HOSPITAL_COMMUNITY)
Admission: RE | Admit: 2017-10-11 | Payer: Medicare Other | Source: Ambulatory Visit | Attending: Cardiovascular Disease | Admitting: Cardiovascular Disease

## 2017-10-14 DIAGNOSIS — E1143 Type 2 diabetes mellitus with diabetic autonomic (poly)neuropathy: Secondary | ICD-10-CM | POA: Diagnosis not present

## 2017-10-14 DIAGNOSIS — F17200 Nicotine dependence, unspecified, uncomplicated: Secondary | ICD-10-CM | POA: Diagnosis not present

## 2017-10-14 DIAGNOSIS — I951 Orthostatic hypotension: Secondary | ICD-10-CM | POA: Diagnosis not present

## 2017-10-15 ENCOUNTER — Other Ambulatory Visit: Payer: Self-pay | Admitting: Cardiovascular Disease

## 2017-10-15 DIAGNOSIS — Z9862 Peripheral vascular angioplasty status: Secondary | ICD-10-CM

## 2017-10-15 DIAGNOSIS — I739 Peripheral vascular disease, unspecified: Secondary | ICD-10-CM

## 2017-10-25 ENCOUNTER — Ambulatory Visit (HOSPITAL_COMMUNITY)
Admission: RE | Admit: 2017-10-25 | Discharge: 2017-10-25 | Disposition: A | Discharge status: Home / Self Care | Payer: Medicare Other | Source: Ambulatory Visit | Attending: Internal Medicine | Admitting: Internal Medicine

## 2017-10-25 DIAGNOSIS — I739 Peripheral vascular disease, unspecified: Secondary | ICD-10-CM | POA: Insufficient documentation

## 2017-10-25 DIAGNOSIS — Z9862 Peripheral vascular angioplasty status: Secondary | ICD-10-CM | POA: Diagnosis not present

## 2017-10-28 ENCOUNTER — Other Ambulatory Visit: Payer: Self-pay | Admitting: *Deleted

## 2017-10-28 DIAGNOSIS — I739 Peripheral vascular disease, unspecified: Secondary | ICD-10-CM

## 2017-10-29 ENCOUNTER — Encounter (HOSPITAL_COMMUNITY): Payer: Medicare Other

## 2017-11-05 DIAGNOSIS — H40011 Open angle with borderline findings, low risk, right eye: Secondary | ICD-10-CM | POA: Diagnosis not present

## 2017-11-15 ENCOUNTER — Encounter (HOSPITAL_COMMUNITY): Payer: Self-pay

## 2017-11-15 ENCOUNTER — Other Ambulatory Visit: Payer: Self-pay

## 2017-11-15 ENCOUNTER — Emergency Department (HOSPITAL_COMMUNITY): Payer: Medicare Other

## 2017-11-15 ENCOUNTER — Observation Stay (HOSPITAL_COMMUNITY)
Admission: EM | Admit: 2017-11-15 | Discharge: 2017-11-17 | Disposition: A | Discharge status: Home / Self Care | Payer: Medicare Other | Attending: Internal Medicine | Admitting: Internal Medicine

## 2017-11-15 DIAGNOSIS — Z79899 Other long term (current) drug therapy: Secondary | ICD-10-CM | POA: Diagnosis not present

## 2017-11-15 DIAGNOSIS — F17200 Nicotine dependence, unspecified, uncomplicated: Secondary | ICD-10-CM | POA: Diagnosis present

## 2017-11-15 DIAGNOSIS — M48061 Spinal stenosis, lumbar region without neurogenic claudication: Secondary | ICD-10-CM | POA: Diagnosis not present

## 2017-11-15 DIAGNOSIS — I639 Cerebral infarction, unspecified: Secondary | ICD-10-CM | POA: Diagnosis present

## 2017-11-15 DIAGNOSIS — F17210 Nicotine dependence, cigarettes, uncomplicated: Secondary | ICD-10-CM | POA: Insufficient documentation

## 2017-11-15 DIAGNOSIS — Z794 Long term (current) use of insulin: Secondary | ICD-10-CM | POA: Insufficient documentation

## 2017-11-15 DIAGNOSIS — E162 Hypoglycemia, unspecified: Secondary | ICD-10-CM | POA: Diagnosis not present

## 2017-11-15 DIAGNOSIS — R6889 Other general symptoms and signs: Secondary | ICD-10-CM | POA: Diagnosis present

## 2017-11-15 DIAGNOSIS — R55 Syncope and collapse: Principal | ICD-10-CM | POA: Diagnosis present

## 2017-11-15 DIAGNOSIS — I69354 Hemiplegia and hemiparesis following cerebral infarction affecting left non-dominant side: Secondary | ICD-10-CM | POA: Diagnosis not present

## 2017-11-15 DIAGNOSIS — I129 Hypertensive chronic kidney disease with stage 1 through stage 4 chronic kidney disease, or unspecified chronic kidney disease: Secondary | ICD-10-CM | POA: Insufficient documentation

## 2017-11-15 DIAGNOSIS — I69959 Hemiplegia and hemiparesis following unspecified cerebrovascular disease affecting unspecified side: Secondary | ICD-10-CM

## 2017-11-15 DIAGNOSIS — Z7982 Long term (current) use of aspirin: Secondary | ICD-10-CM | POA: Insufficient documentation

## 2017-11-15 DIAGNOSIS — N183 Chronic kidney disease, stage 3 unspecified: Secondary | ICD-10-CM

## 2017-11-15 DIAGNOSIS — Z8673 Personal history of transient ischemic attack (TIA), and cerebral infarction without residual deficits: Secondary | ICD-10-CM | POA: Diagnosis present

## 2017-11-15 DIAGNOSIS — I739 Peripheral vascular disease, unspecified: Secondary | ICD-10-CM | POA: Diagnosis present

## 2017-11-15 DIAGNOSIS — E1122 Type 2 diabetes mellitus with diabetic chronic kidney disease: Secondary | ICD-10-CM

## 2017-11-15 DIAGNOSIS — E119 Type 2 diabetes mellitus without complications: Secondary | ICD-10-CM | POA: Insufficient documentation

## 2017-11-15 DIAGNOSIS — I10 Essential (primary) hypertension: Secondary | ICD-10-CM | POA: Diagnosis not present

## 2017-11-15 DIAGNOSIS — E785 Hyperlipidemia, unspecified: Secondary | ICD-10-CM | POA: Diagnosis present

## 2017-11-15 DIAGNOSIS — W3400XA Accidental discharge from unspecified firearms or gun, initial encounter: Secondary | ICD-10-CM

## 2017-11-15 DIAGNOSIS — E1169 Type 2 diabetes mellitus with other specified complication: Secondary | ICD-10-CM

## 2017-11-15 DIAGNOSIS — E161 Other hypoglycemia: Secondary | ICD-10-CM | POA: Diagnosis not present

## 2017-11-15 LAB — CBC
HCT: 35.8 % — ABNORMAL LOW (ref 39.0–52.0)
HCT: 36.3 % — ABNORMAL LOW (ref 39.0–52.0)
Hemoglobin: 11.4 g/dL — ABNORMAL LOW (ref 13.0–17.0)
Hemoglobin: 11.5 g/dL — ABNORMAL LOW (ref 13.0–17.0)
MCH: 30.3 pg (ref 26.0–34.0)
MCH: 30.2 pg (ref 26.0–34.0)
MCHC: 31.8 g/dL (ref 30.0–36.0)
MCHC: 31.7 g/dL (ref 30.0–36.0)
MCV: 95.2 fL (ref 78.0–100.0)
MCV: 95.3 fL (ref 78.0–100.0)
Platelets: 245 10*3/uL (ref 150–400)
Platelets: 249 10*3/uL (ref 150–400)
RBC: 3.76 MIL/uL — ABNORMAL LOW (ref 4.22–5.81)
RBC: 3.81 MIL/uL — ABNORMAL LOW (ref 4.22–5.81)
RDW: 13.9 % (ref 11.5–15.5)
RDW: 14.1 % (ref 11.5–15.5)
WBC: 5.3 10*3/uL (ref 4.0–10.5)
WBC: 6 10*3/uL (ref 4.0–10.5)

## 2017-11-15 LAB — BASIC METABOLIC PANEL
Anion gap: 7 (ref 5–15)
BUN: 17 mg/dL (ref 8–23)
CO2: 26 mmol/L (ref 22–32)
Calcium: 8.7 mg/dL — ABNORMAL LOW (ref 8.9–10.3)
Chloride: 108 mmol/L (ref 98–111)
Creatinine, Ser: 1.61 mg/dL — ABNORMAL HIGH (ref 0.61–1.24)
GFR calc Af Amer: 48 mL/min — ABNORMAL LOW (ref >60)
GFR calc non Af Amer: 41 mL/min — ABNORMAL LOW (ref >60)
Glucose, Bld: 126 mg/dL — ABNORMAL HIGH (ref 70–99)
Potassium: 3.3 mmol/L — ABNORMAL LOW (ref 3.5–5.1)
Sodium: 141 mmol/L (ref 135–145)

## 2017-11-15 LAB — TROPONIN I
Troponin I: <0.03 ng/mL (ref <0.03)
Troponin I: <0.03 ng/mL (ref <0.03)

## 2017-11-15 LAB — URINALYSIS, ROUTINE W REFLEX MICROSCOPIC
Bilirubin Urine: NEGATIVE
Glucose, UA: 50 mg/dL — AB
Ketones, ur: NEGATIVE mg/dL
Leukocytes, UA: NEGATIVE
Nitrite: NEGATIVE
Protein, ur: NEGATIVE mg/dL
Specific Gravity, Urine: 1.016 (ref 1.005–1.030)
pH: 6 (ref 5.0–8.0)

## 2017-11-15 LAB — HEMOGLOBIN A1C
Hgb A1c MFr Bld: 6.8 % — ABNORMAL HIGH (ref 4.8–5.6)
Mean Plasma Glucose: 148.46 mg/dL

## 2017-11-15 LAB — CREATININE, SERUM
Creatinine, Ser: 1.47 mg/dL — ABNORMAL HIGH (ref 0.61–1.24)
GFR calc Af Amer: 54 mL/min — ABNORMAL LOW (ref >60)
GFR calc non Af Amer: 46 mL/min — ABNORMAL LOW (ref >60)

## 2017-11-15 LAB — CBG MONITORING, ED: Glucose-Capillary: 128 mg/dL — ABNORMAL HIGH (ref 70–99)

## 2017-11-15 LAB — I-STAT TROPONIN, ED: Troponin i, poc: 0 ng/mL (ref 0.00–0.08)

## 2017-11-15 LAB — GLUCOSE, CAPILLARY: Glucose-Capillary: 106 mg/dL — ABNORMAL HIGH (ref 70–99)

## 2017-11-15 LAB — TSH: TSH: 1.236 u[IU]/mL (ref 0.350–4.500)

## 2017-11-15 MED ORDER — ACETAMINOPHEN 650 MG RE SUPP: 650.0000 mg | Freq: Four times a day (QID) | RECTAL | Status: DC | PRN

## 2017-11-15 MED ORDER — SODIUM CHLORIDE 0.9% FLUSH: 3.0000 mL | INTRAVENOUS | Status: DC | PRN

## 2017-11-15 MED ORDER — TRAZODONE HCL 50 MG PO TABS: 25.0000 mg | ORAL_TABLET | Freq: Every evening | ORAL | Status: DC | PRN

## 2017-11-15 MED ORDER — SODIUM CHLORIDE 0.9% FLUSH
3.0000 mL | Freq: Two times a day (BID) | INTRAVENOUS | Status: DC
  Administered 2017-11-16: 3 mL via INTRAVENOUS

## 2017-11-15 MED ORDER — POTASSIUM CHLORIDE IN NACL 20-0.9 MEQ/L-% IV SOLN
INTRAVENOUS | Status: DC
  Filled 2017-11-15: qty 1000

## 2017-11-15 MED ORDER — SODIUM CHLORIDE 0.9 % IV SOLN: 250.0000 mL | INTRAVENOUS | Status: DC | PRN

## 2017-11-15 MED ORDER — SODIUM CHLORIDE 0.9 % IV BOLUS
1000.0000 mL | Freq: Once | INTRAVENOUS | Status: AC
  Administered 2017-11-15: 1000 mL via INTRAVENOUS

## 2017-11-15 MED ORDER — ENOXAPARIN SODIUM 40 MG/0.4ML ~~LOC~~ SOLN
40.0000 mg | SUBCUTANEOUS | Status: DC
  Administered 2017-11-15 – 2017-11-16 (×2): 40 mg via SUBCUTANEOUS
  Filled 2017-11-15 (×2): qty 0.4

## 2017-11-15 MED ORDER — OXYCODONE HCL 5 MG PO TABS: 5.0000 mg | ORAL_TABLET | ORAL | Status: DC | PRN

## 2017-11-15 MED ORDER — ACETAMINOPHEN 325 MG PO TABS: 650.0000 mg | ORAL_TABLET | Freq: Four times a day (QID) | ORAL | Status: DC | PRN

## 2017-11-15 MED ORDER — ONDANSETRON HCL 4 MG/2ML IJ SOLN: 4.0000 mg | Freq: Four times a day (QID) | INTRAMUSCULAR | Status: DC | PRN

## 2017-11-15 MED ORDER — ONDANSETRON HCL 4 MG PO TABS: 4.0000 mg | ORAL_TABLET | Freq: Four times a day (QID) | ORAL | Status: DC | PRN

## 2017-11-15 NOTE — ED Triage Notes (Signed)
Per EMS, pt from home with a c/o LOC while sitting and getting his toe nails filed. Pt reported to be dizzy prior to losing consciousness. The LOC was witnessed and the pt was reported to be unresponsive for appro. 11 mins. Family stated the pt had snoring respirations and became stiff all over. Family assisted him to the ground. He did not fall. No injuries sustained. Pt takes Plavix. Has been CAOx4 post LOC.
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
## 2017-11-15 NOTE — ED Notes (Signed)
Attempted report x1. 

## 2017-11-15 NOTE — ED Notes (Signed)
Report given to RN on 6E rm 27.

## 2017-11-15 NOTE — ED Notes (Signed)
Patient transported to X-ray 

## 2017-11-15 NOTE — ED Notes (Signed)
[name] (daughter)  Contact # [phone]  Called and asked about pt's status. Stated pt has some dementia. Will be arriving to the hospital shortly.

## 2017-11-16 ENCOUNTER — Observation Stay (HOSPITAL_BASED_OUTPATIENT_CLINIC_OR_DEPARTMENT_OTHER): Payer: Medicare Other

## 2017-11-16 ENCOUNTER — Observation Stay (HOSPITAL_COMMUNITY): Payer: Medicare Other

## 2017-11-16 DIAGNOSIS — E1122 Type 2 diabetes mellitus with diabetic chronic kidney disease: Secondary | ICD-10-CM | POA: Diagnosis not present

## 2017-11-16 DIAGNOSIS — Z794 Long term (current) use of insulin: Secondary | ICD-10-CM

## 2017-11-16 DIAGNOSIS — I10 Essential (primary) hypertension: Secondary | ICD-10-CM

## 2017-11-16 DIAGNOSIS — R55 Syncope and collapse: Secondary | ICD-10-CM | POA: Diagnosis not present

## 2017-11-16 DIAGNOSIS — N183 Chronic kidney disease, stage 3 (moderate): Secondary | ICD-10-CM

## 2017-11-16 DIAGNOSIS — I739 Peripheral vascular disease, unspecified: Secondary | ICD-10-CM | POA: Diagnosis not present

## 2017-11-16 LAB — BASIC METABOLIC PANEL
Anion gap: 6 (ref 5–15)
BUN: 18 mg/dL (ref 8–23)
CO2: 27 mmol/L (ref 22–32)
Calcium: 8.3 mg/dL — ABNORMAL LOW (ref 8.9–10.3)
Chloride: 106 mmol/L (ref 98–111)
Creatinine, Ser: 1.5 mg/dL — ABNORMAL HIGH (ref 0.61–1.24)
GFR calc Af Amer: 52 mL/min — ABNORMAL LOW (ref >60)
GFR calc non Af Amer: 45 mL/min — ABNORMAL LOW (ref >60)
Glucose, Bld: 183 mg/dL — ABNORMAL HIGH (ref 70–99)
Potassium: 3.6 mmol/L (ref 3.5–5.1)
Sodium: 139 mmol/L (ref 135–145)

## 2017-11-16 LAB — MAGNESIUM: Magnesium: 1.6 mg/dL — ABNORMAL LOW (ref 1.7–2.4)

## 2017-11-16 LAB — GLUCOSE, CAPILLARY
Glucose-Capillary: 175 mg/dL — ABNORMAL HIGH (ref 70–99)
Glucose-Capillary: 181 mg/dL — ABNORMAL HIGH (ref 70–99)

## 2017-11-16 LAB — TROPONIN I: Troponin I: <0.03 ng/mL (ref <0.03)

## 2017-11-16 MED ORDER — MAGNESIUM SULFATE 2 GM/50ML IV SOLN
2.0000 g | Freq: Once | INTRAVENOUS | Status: AC
  Administered 2017-11-16: 2 g via INTRAVENOUS
  Filled 2017-11-16: qty 50

## 2017-11-16 MED ORDER — SODIUM CHLORIDE 0.9 % IV SOLN: INTRAVENOUS | Status: DC

## 2017-11-16 MED ORDER — POTASSIUM CHLORIDE CRYS ER 20 MEQ PO TBCR
40.0000 meq | EXTENDED_RELEASE_TABLET | Freq: Once | ORAL | Status: AC
  Administered 2017-11-16: 40 meq via ORAL
  Filled 2017-11-16: qty 2

## 2017-11-16 MED ORDER — POLYVINYL ALCOHOL 1.4 % OP SOLN
1.0000 [drp] | OPHTHALMIC | Status: DC | PRN
  Administered 2017-11-16: 1 [drp] via OPHTHALMIC
  Filled 2017-11-16: qty 15

## 2017-11-16 MED ORDER — HYDRALAZINE HCL 20 MG/ML IJ SOLN
5.0000 mg | INTRAMUSCULAR | Status: DC | PRN
  Administered 2017-11-16: 5 mg via INTRAVENOUS
  Filled 2017-11-16: qty 1

## 2017-11-16 MED ORDER — SODIUM CHLORIDE 0.9 % IV SOLN
250.0000 mL | INTRAVENOUS | Status: DC | PRN
  Administered 2017-11-16: 250 mL via INTRAVENOUS

## 2017-11-16 MED ORDER — SODIUM CHLORIDE 0.9 % IV SOLN
INTRAVENOUS | Status: AC
  Administered 2017-11-16: 16:00:00 via INTRAVENOUS

## 2017-11-16 NOTE — Progress Notes (Signed)
VASCULAR LAB PRELIMINARY  PRELIMINARY  PRELIMINARY  PRELIMINARY  Carotid duplex completed.    Preliminary report:  1-39% ICA plaquing. Vertebral artery flow is antegrade.  Chapin Arduini, RVT 11/16/2017, 5:32 PM
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
## 2017-11-16 NOTE — Progress Notes (Signed)
  Echocardiogram 2D Echocardiogram has been performed.  Harry [name] 11/16/2017, 5:51 PM

## 2017-11-17 DIAGNOSIS — I951 Orthostatic hypotension: Secondary | ICD-10-CM

## 2017-11-17 DIAGNOSIS — I10 Essential (primary) hypertension: Secondary | ICD-10-CM | POA: Diagnosis not present

## 2017-11-17 DIAGNOSIS — R55 Syncope and collapse: Secondary | ICD-10-CM | POA: Diagnosis not present

## 2017-11-17 LAB — GLUCOSE, CAPILLARY
Glucose-Capillary: 165 mg/dL — ABNORMAL HIGH (ref 70–99)
Glucose-Capillary: 144 mg/dL — ABNORMAL HIGH (ref 70–99)

## 2017-11-17 LAB — BASIC METABOLIC PANEL
Anion gap: 7 (ref 5–15)
BUN: 18 mg/dL (ref 8–23)
CO2: 23 mmol/L (ref 22–32)
Calcium: 8.3 mg/dL — ABNORMAL LOW (ref 8.9–10.3)
Chloride: 108 mmol/L (ref 98–111)
Creatinine, Ser: 1.34 mg/dL — ABNORMAL HIGH (ref 0.61–1.24)
GFR calc Af Amer: 60 mL/min — ABNORMAL LOW (ref >60)
GFR calc non Af Amer: 52 mL/min — ABNORMAL LOW (ref >60)
Glucose, Bld: 151 mg/dL — ABNORMAL HIGH (ref 70–99)
Potassium: 4 mmol/L (ref 3.5–5.1)
Sodium: 138 mmol/L (ref 135–145)

## 2017-11-17 LAB — ECHOCARDIOGRAM COMPLETE
Height: 72 in
Weight: 2768 oz

## 2017-11-17 LAB — CBC
HCT: 32.7 % — ABNORMAL LOW (ref 39.0–52.0)
Hemoglobin: 10.6 g/dL — ABNORMAL LOW (ref 13.0–17.0)
MCH: 30.2 pg (ref 26.0–34.0)
MCHC: 32.4 g/dL (ref 30.0–36.0)
MCV: 93.2 fL (ref 78.0–100.0)
Platelets: 225 10*3/uL (ref 150–400)
RBC: 3.51 MIL/uL — ABNORMAL LOW (ref 4.22–5.81)
RDW: 13.9 % (ref 11.5–15.5)
WBC: 5.5 10*3/uL (ref 4.0–10.5)

## 2017-11-17 NOTE — Care Management Note (Signed)
Case Management Note  Patient Details  Name: Harry [name] MRN: [id number] Date of Birth: 07-31-1945  Subjective/Objective:      Pt presented for syncope.  Pt from home with family.  He has 2 walkers that he sometimes uses.  Pt used walker with PT today.  Pt to return home with family and agrees to [facility] PT.              Action/Plan: List presented to patient and family.  They choose [facility] as they have a family member who works there.  Referral called to [facility] liaison.  Information placed on AVS.  Expected Discharge Date:  11/17/17               Expected Discharge Plan:  [place]  In-House Referral:  NA  Discharge planning Services  CM Consult  Post Acute Care Choice:  Home Health Choice offered to:  Patient, Spouse  DME Arranged:  N/A DME Agency:  NA  HH Arranged:  PT [place] Agency:  [facility] (formerly [employer])  Status of Service:  Completed, signed off  If discussed at [employer], dates discussed:    Additional Comments:  Claudie Leach, RN 11/17/2017, 4:15 PM

## 2017-11-17 NOTE — Plan of Care (Signed)
  Problem: Education: Goal: Knowledge of General Education information will improve Description Including pain rating scale, medication(s)/side effects and non-pharmacologic comfort measures Outcome: Progressing Note:  POC reviewed with pt.   

## 2017-11-17 NOTE — Evaluation (Signed)
Physical Therapy Evaluation Patient Details Name: Harry [name] MRN: [id number] DOB: 01/15/1946 Today's Date: 11/17/2017   History of Present Illness  Pt is a 72 y.o. M with significant PMH of kidney disease, hypertension, peripheral vascular disease, stroke, diabetes type 2 and dementia who presents for evaluation of syncope. MRI shows no acute intracranial process, multiple old posterior circulation infarcts, moderate chronic small vessel ischemic changes and moderate chronic small vessel parenchymal brain volume loss.   Clinical Impression  Pt admitted with above diagnosis. Pt currently with functional limitations due to the deficits listed below (see PT Problem List). Presenting with decreased functional mobility secondary to balance deficits and history of falls. Displays significant orthostatic hypotension with TED hose donned (see vitals flowsheet); denies dizziness but states his left eye "gets foggy." RN aware. Pt will benefit from skilled PT to increase their independence and safety with mobility to allow discharge to the venue listed below.       Follow Up Recommendations Home health PT;Supervision for mobility/OOB    Equipment Recommendations  None recommended by PT    Recommendations for Other Services       Precautions / Restrictions Precautions Precautions: Fall Precaution Comments: orthostatic Restrictions Weight Bearing Restrictions: No      Mobility  Bed Mobility Overal bed mobility: Independent                Transfers Overall transfer level: Needs assistance Equipment used: Rolling walker (2 wheeled) Transfers: Sit to/from Stand Sit to Stand: Min guard         General transfer comment: posterior lean upon initial transition to standing indicative of falls  Ambulation/Gait Ambulation/Gait assistance: Min guard Gait Distance (Feet): 15 Feet Assistive device: Rolling walker (2 wheeled) Gait Pattern/deviations: Decreased dorsiflexion -  right;Decreased dorsiflexion - left;Decreased stride length Gait velocity: decreased   General Gait Details: patient with no overt LOB during gait requiring min guard assist for safety  Stairs            Wheelchair Mobility    Modified Rankin (Stroke Patients Only)       Balance Overall balance assessment: Needs assistance Sitting-balance support: No upper extremity supported;Feet supported Sitting balance-Leahy Scale: Good     Standing balance support: No upper extremity supported;During functional activity Standing balance-Leahy Scale: Fair                               Pertinent Vitals/Pain Pain Assessment: No/denies pain    Home Living Family/patient expects to be discharged to:: Private residence Living Arrangements: Children(daughter) Available Help at Discharge: Family;Available 24 hours/day Type of Home: House Home Access: Level entry     Home Layout: Two level Home Equipment: Walker - 4 wheels;Cane - single point      Prior Function Level of Independence: Independent with assistive device(s)         Comments: uses walker for outdoor ambulation. states he has had one fall in recent months     Hand Dominance        Extremity/Trunk Assessment   Upper Extremity Assessment Upper Extremity Assessment: Overall WFL for tasks assessed    Lower Extremity Assessment Lower Extremity Assessment: Overall WFL for tasks assessed    Cervical / Trunk Assessment Cervical / Trunk Assessment: Normal  Communication   Communication: No difficulties  Cognition Arousal/Alertness: Awake/alert Behavior During Therapy: WFL for tasks assessed/performed Overall Cognitive Status: History of cognitive impairments - at baseline  General Comments: history of dementia. Alert and oriented x 3       General Comments General comments (skin integrity, edema, etc.): L facial droop with visual impairment from  previous CVA    Exercises     Assessment/Plan    PT Assessment Patient needs continued PT services  PT Problem List Decreased activity tolerance;Decreased balance;Decreased mobility       PT Treatment Interventions DME instruction;Gait training;Stair training;Functional mobility training;Therapeutic activities;Therapeutic exercise;Balance training;Patient/family education    PT Goals (Current goals can be found in the Care Plan section)  Acute Rehab PT Goals Patient Stated Goal: "figure out why my blood pressure is dropping" PT Goal Formulation: With patient Time For Goal Achievement: 12/01/17 Potential to Achieve Goals: Good    Frequency Min 3X/week   Barriers to discharge        Co-evaluation               AM-PAC PT "6 Clicks" Daily Activity  Outcome Measure Difficulty turning over in bed (including adjusting bedclothes, sheets and blankets)?: None Difficulty moving from lying on back to sitting on the side of the bed? : None Difficulty sitting down on and standing up from a chair with arms (e.g., wheelchair, bedside commode, etc,.)?: A Little Help needed moving to and from a bed to chair (including a wheelchair)?: A Little Help needed walking in hospital room?: A Little Help needed climbing 3-5 steps with a railing? : A Little 6 Click Score: 20    End of Session Equipment Utilized During Treatment: Gait belt Activity Tolerance: Other (comment)(limited by orthostatic hypotension) Patient left: in chair;with call bell/phone within reach;with chair alarm set Nurse Communication: Mobility status;Other (comment)(vital signs) PT Visit Diagnosis: Unsteadiness on feet (R26.81);Dizziness and giddiness (R42)    Time: 1010-1038 PT Time Calculation (min) (ACUTE ONLY): 28 min   Charges:   PT Evaluation $PT Eval Moderate Complexity: 1 Mod PT Treatments $Therapeutic Activity: 8-22 mins       Ellamae Sia, PT, DPT Acute Rehabilitation Services Pager  [phone] Office [phone]   Harry [name] 11/17/2017, 10:53 AM

## 2017-11-17 NOTE — Discharge Instructions (Signed)
Please follow up with [facility].  You will be contacted for Physical therapy visits to be scheduled.  If you should need to call them, please call [phone].
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
## 2017-11-19 DIAGNOSIS — I69354 Hemiplegia and hemiparesis following cerebral infarction affecting left non-dominant side: Secondary | ICD-10-CM | POA: Diagnosis not present

## 2017-11-19 DIAGNOSIS — Z955 Presence of coronary angioplasty implant and graft: Secondary | ICD-10-CM | POA: Diagnosis not present

## 2017-11-19 DIAGNOSIS — Z9181 History of falling: Secondary | ICD-10-CM | POA: Diagnosis not present

## 2017-11-19 DIAGNOSIS — E1151 Type 2 diabetes mellitus with diabetic peripheral angiopathy without gangrene: Secondary | ICD-10-CM | POA: Diagnosis not present

## 2017-11-19 DIAGNOSIS — D631 Anemia in chronic kidney disease: Secondary | ICD-10-CM | POA: Diagnosis not present

## 2017-11-19 DIAGNOSIS — F329 Major depressive disorder, single episode, unspecified: Secondary | ICD-10-CM | POA: Diagnosis not present

## 2017-11-19 DIAGNOSIS — H409 Unspecified glaucoma: Secondary | ICD-10-CM | POA: Diagnosis not present

## 2017-11-19 DIAGNOSIS — Z7982 Long term (current) use of aspirin: Secondary | ICD-10-CM | POA: Diagnosis not present

## 2017-11-19 DIAGNOSIS — E7800 Pure hypercholesterolemia, unspecified: Secondary | ICD-10-CM | POA: Diagnosis not present

## 2017-11-19 DIAGNOSIS — E1122 Type 2 diabetes mellitus with diabetic chronic kidney disease: Secondary | ICD-10-CM | POA: Diagnosis not present

## 2017-11-19 DIAGNOSIS — N183 Chronic kidney disease, stage 3 (moderate): Secondary | ICD-10-CM | POA: Diagnosis not present

## 2017-11-19 DIAGNOSIS — H53132 Sudden visual loss, left eye: Secondary | ICD-10-CM | POA: Diagnosis not present

## 2017-11-19 DIAGNOSIS — M48061 Spinal stenosis, lumbar region without neurogenic claudication: Secondary | ICD-10-CM | POA: Diagnosis not present

## 2017-11-19 DIAGNOSIS — I951 Orthostatic hypotension: Secondary | ICD-10-CM | POA: Diagnosis not present

## 2017-11-19 DIAGNOSIS — G3183 Dementia with Lewy bodies: Secondary | ICD-10-CM | POA: Diagnosis not present

## 2017-11-19 DIAGNOSIS — R339 Retention of urine, unspecified: Secondary | ICD-10-CM | POA: Diagnosis not present

## 2017-11-19 DIAGNOSIS — K5900 Constipation, unspecified: Secondary | ICD-10-CM | POA: Diagnosis not present

## 2017-11-19 DIAGNOSIS — E1142 Type 2 diabetes mellitus with diabetic polyneuropathy: Secondary | ICD-10-CM | POA: Diagnosis not present

## 2017-11-19 DIAGNOSIS — I129 Hypertensive chronic kidney disease with stage 1 through stage 4 chronic kidney disease, or unspecified chronic kidney disease: Secondary | ICD-10-CM | POA: Diagnosis not present

## 2017-11-19 DIAGNOSIS — Z9862 Peripheral vascular angioplasty status: Secondary | ICD-10-CM | POA: Diagnosis not present

## 2017-11-19 DIAGNOSIS — F17210 Nicotine dependence, cigarettes, uncomplicated: Secondary | ICD-10-CM | POA: Diagnosis not present

## 2017-11-19 DIAGNOSIS — F0280 Dementia in other diseases classified elsewhere without behavioral disturbance: Secondary | ICD-10-CM | POA: Diagnosis not present

## 2017-11-21 DIAGNOSIS — R55 Syncope and collapse: Secondary | ICD-10-CM | POA: Diagnosis not present

## 2017-11-21 DIAGNOSIS — I69354 Hemiplegia and hemiparesis following cerebral infarction affecting left non-dominant side: Secondary | ICD-10-CM | POA: Diagnosis not present

## 2017-11-21 DIAGNOSIS — F0280 Dementia in other diseases classified elsewhere without behavioral disturbance: Secondary | ICD-10-CM | POA: Diagnosis not present

## 2017-11-21 DIAGNOSIS — I951 Orthostatic hypotension: Secondary | ICD-10-CM | POA: Diagnosis not present

## 2017-11-21 DIAGNOSIS — E1122 Type 2 diabetes mellitus with diabetic chronic kidney disease: Secondary | ICD-10-CM | POA: Diagnosis not present

## 2017-11-21 DIAGNOSIS — Z23 Encounter for immunization: Secondary | ICD-10-CM | POA: Diagnosis not present

## 2017-11-21 DIAGNOSIS — N183 Chronic kidney disease, stage 3 (moderate): Secondary | ICD-10-CM | POA: Diagnosis not present

## 2017-11-21 DIAGNOSIS — M48061 Spinal stenosis, lumbar region without neurogenic claudication: Secondary | ICD-10-CM | POA: Diagnosis not present

## 2017-11-21 DIAGNOSIS — G8190 Hemiplegia, unspecified affecting unspecified side: Secondary | ICD-10-CM | POA: Diagnosis not present

## 2017-11-21 DIAGNOSIS — G3183 Dementia with Lewy bodies: Secondary | ICD-10-CM | POA: Diagnosis not present

## 2017-11-21 DIAGNOSIS — I129 Hypertensive chronic kidney disease with stage 1 through stage 4 chronic kidney disease, or unspecified chronic kidney disease: Secondary | ICD-10-CM | POA: Diagnosis not present

## 2017-11-21 DIAGNOSIS — E1010 Type 1 diabetes mellitus with ketoacidosis without coma: Secondary | ICD-10-CM | POA: Diagnosis not present

## 2017-11-22 ENCOUNTER — Inpatient Hospital Stay (HOSPITAL_COMMUNITY)
Admission: EM | Admit: 2017-11-22 | Discharge: 2017-11-25 | DRG: 312 | Disposition: A | Discharge status: Home Health | Payer: Medicare Other | Attending: Family Medicine | Admitting: Family Medicine

## 2017-11-22 ENCOUNTER — Other Ambulatory Visit: Payer: Self-pay

## 2017-11-22 ENCOUNTER — Emergency Department (HOSPITAL_COMMUNITY): Payer: Medicare Other

## 2017-11-22 ENCOUNTER — Encounter (HOSPITAL_COMMUNITY): Payer: Self-pay | Admitting: *Deleted

## 2017-11-22 DIAGNOSIS — H409 Unspecified glaucoma: Secondary | ICD-10-CM | POA: Diagnosis present

## 2017-11-22 DIAGNOSIS — Z794 Long term (current) use of insulin: Secondary | ICD-10-CM | POA: Diagnosis not present

## 2017-11-22 DIAGNOSIS — E1142 Type 2 diabetes mellitus with diabetic polyneuropathy: Secondary | ICD-10-CM | POA: Diagnosis present

## 2017-11-22 DIAGNOSIS — I951 Orthostatic hypotension: Principal | ICD-10-CM | POA: Diagnosis present

## 2017-11-22 DIAGNOSIS — I499 Cardiac arrhythmia, unspecified: Secondary | ICD-10-CM | POA: Diagnosis not present

## 2017-11-22 DIAGNOSIS — E785 Hyperlipidemia, unspecified: Secondary | ICD-10-CM | POA: Diagnosis present

## 2017-11-22 DIAGNOSIS — N183 Chronic kidney disease, stage 3 unspecified: Secondary | ICD-10-CM

## 2017-11-22 DIAGNOSIS — Z888 Allergy status to other drugs, medicaments and biological substances status: Secondary | ICD-10-CM

## 2017-11-22 DIAGNOSIS — Z79899 Other long term (current) drug therapy: Secondary | ICD-10-CM

## 2017-11-22 DIAGNOSIS — Z7902 Long term (current) use of antithrombotics/antiplatelets: Secondary | ICD-10-CM

## 2017-11-22 DIAGNOSIS — Z7982 Long term (current) use of aspirin: Secondary | ICD-10-CM

## 2017-11-22 DIAGNOSIS — F329 Major depressive disorder, single episode, unspecified: Secondary | ICD-10-CM | POA: Diagnosis present

## 2017-11-22 DIAGNOSIS — M4800 Spinal stenosis, site unspecified: Secondary | ICD-10-CM

## 2017-11-22 DIAGNOSIS — E1169 Type 2 diabetes mellitus with other specified complication: Secondary | ICD-10-CM

## 2017-11-22 DIAGNOSIS — E1122 Type 2 diabetes mellitus with diabetic chronic kidney disease: Secondary | ICD-10-CM | POA: Diagnosis not present

## 2017-11-22 DIAGNOSIS — I129 Hypertensive chronic kidney disease with stage 1 through stage 4 chronic kidney disease, or unspecified chronic kidney disease: Secondary | ICD-10-CM | POA: Diagnosis not present

## 2017-11-22 DIAGNOSIS — I10 Essential (primary) hypertension: Secondary | ICD-10-CM | POA: Diagnosis not present

## 2017-11-22 DIAGNOSIS — T444X5A Adverse effect of predominantly alpha-adrenoreceptor agonists, initial encounter: Secondary | ICD-10-CM | POA: Diagnosis not present

## 2017-11-22 DIAGNOSIS — I639 Cerebral infarction, unspecified: Secondary | ICD-10-CM | POA: Diagnosis present

## 2017-11-22 DIAGNOSIS — E1151 Type 2 diabetes mellitus with diabetic peripheral angiopathy without gangrene: Secondary | ICD-10-CM | POA: Diagnosis not present

## 2017-11-22 DIAGNOSIS — D631 Anemia in chronic kidney disease: Secondary | ICD-10-CM | POA: Diagnosis present

## 2017-11-22 DIAGNOSIS — D649 Anemia, unspecified: Secondary | ICD-10-CM | POA: Diagnosis not present

## 2017-11-22 DIAGNOSIS — S199XXA Unspecified injury of neck, initial encounter: Secondary | ICD-10-CM | POA: Diagnosis not present

## 2017-11-22 DIAGNOSIS — I739 Peripheral vascular disease, unspecified: Secondary | ICD-10-CM

## 2017-11-22 DIAGNOSIS — H5462 Unqualified visual loss, left eye, normal vision right eye: Secondary | ICD-10-CM | POA: Diagnosis present

## 2017-11-22 DIAGNOSIS — Z8673 Personal history of transient ischemic attack (TIA), and cerebral infarction without residual deficits: Secondary | ICD-10-CM

## 2017-11-22 DIAGNOSIS — I959 Hypotension, unspecified: Secondary | ICD-10-CM | POA: Diagnosis not present

## 2017-11-22 DIAGNOSIS — Z9841 Cataract extraction status, right eye: Secondary | ICD-10-CM

## 2017-11-22 DIAGNOSIS — R55 Syncope and collapse: Secondary | ICD-10-CM | POA: Diagnosis not present

## 2017-11-22 DIAGNOSIS — S0990XA Unspecified injury of head, initial encounter: Secondary | ICD-10-CM | POA: Diagnosis not present

## 2017-11-22 DIAGNOSIS — W19XXXA Unspecified fall, initial encounter: Secondary | ICD-10-CM | POA: Diagnosis not present

## 2017-11-22 DIAGNOSIS — Z833 Family history of diabetes mellitus: Secondary | ICD-10-CM

## 2017-11-22 DIAGNOSIS — F17200 Nicotine dependence, unspecified, uncomplicated: Secondary | ICD-10-CM

## 2017-11-22 DIAGNOSIS — R339 Retention of urine, unspecified: Secondary | ICD-10-CM | POA: Diagnosis not present

## 2017-11-22 DIAGNOSIS — F17210 Nicotine dependence, cigarettes, uncomplicated: Secondary | ICD-10-CM | POA: Diagnosis present

## 2017-11-22 DIAGNOSIS — Z9842 Cataract extraction status, left eye: Secondary | ICD-10-CM

## 2017-11-22 DIAGNOSIS — Z8249 Family history of ischemic heart disease and other diseases of the circulatory system: Secondary | ICD-10-CM

## 2017-11-22 LAB — CBC WITH DIFFERENTIAL/PLATELET
Abs Immature Granulocytes: 0 10*3/uL (ref 0.0–0.1)
Basophils Absolute: 0 10*3/uL (ref 0.0–0.1)
Basophils Relative: 1 %
Eosinophils Absolute: 0.1 10*3/uL (ref 0.0–0.7)
Eosinophils Relative: 2 %
HCT: 34.6 % — ABNORMAL LOW (ref 39.0–52.0)
Hemoglobin: 11 g/dL — ABNORMAL LOW (ref 13.0–17.0)
Immature Granulocytes: 0 %
Lymphocytes Relative: 34 %
Lymphs Abs: 1.3 10*3/uL (ref 0.7–4.0)
MCH: 30.1 pg (ref 26.0–34.0)
MCHC: 31.8 g/dL (ref 30.0–36.0)
MCV: 94.8 fL (ref 78.0–100.0)
Monocytes Absolute: 0.3 10*3/uL (ref 0.1–1.0)
Monocytes Relative: 7 %
Neutro Abs: 2 10*3/uL (ref 1.7–7.7)
Neutrophils Relative %: 56 %
Platelets: 234 10*3/uL (ref 150–400)
RBC: 3.65 MIL/uL — ABNORMAL LOW (ref 4.22–5.81)
RDW: 13.8 % (ref 11.5–15.5)
WBC: 3.7 10*3/uL — ABNORMAL LOW (ref 4.0–10.5)

## 2017-11-22 LAB — URINALYSIS, ROUTINE W REFLEX MICROSCOPIC
Bilirubin Urine: NEGATIVE
Glucose, UA: 50 mg/dL — AB
Hgb urine dipstick: NEGATIVE
Ketones, ur: NEGATIVE mg/dL
Leukocytes, UA: NEGATIVE
Nitrite: NEGATIVE
Protein, ur: NEGATIVE mg/dL
Specific Gravity, Urine: 1.016 (ref 1.005–1.030)
pH: 5 (ref 5.0–8.0)

## 2017-11-22 LAB — COMPREHENSIVE METABOLIC PANEL
ALT: 13 U/L (ref 0–44)
AST: 26 U/L (ref 15–41)
Albumin: 3.8 g/dL (ref 3.5–5.0)
Alkaline Phosphatase: 57 U/L (ref 38–126)
Anion gap: 10 (ref 5–15)
BUN: 20 mg/dL (ref 8–23)
CO2: 23 mmol/L (ref 22–32)
Calcium: 8.6 mg/dL — ABNORMAL LOW (ref 8.9–10.3)
Chloride: 110 mmol/L (ref 98–111)
Creatinine, Ser: 1.68 mg/dL — ABNORMAL HIGH (ref 0.61–1.24)
GFR calc Af Amer: 46 mL/min — ABNORMAL LOW (ref >60)
GFR calc non Af Amer: 39 mL/min — ABNORMAL LOW (ref >60)
Glucose, Bld: 93 mg/dL (ref 70–99)
Potassium: 4.3 mmol/L (ref 3.5–5.1)
Sodium: 143 mmol/L (ref 135–145)
Total Bilirubin: 1.5 mg/dL — ABNORMAL HIGH (ref 0.3–1.2)
Total Protein: 7 g/dL (ref 6.5–8.1)

## 2017-11-22 LAB — BASIC METABOLIC PANEL
Anion gap: 11 (ref 5–15)
BUN: 21 mg/dL (ref 8–23)
CO2: 23 mmol/L (ref 22–32)
Calcium: 8.6 mg/dL — ABNORMAL LOW (ref 8.9–10.3)
Chloride: 109 mmol/L (ref 98–111)
Creatinine, Ser: 1.73 mg/dL — ABNORMAL HIGH (ref 0.61–1.24)
GFR calc Af Amer: 44 mL/min — ABNORMAL LOW (ref >60)
GFR calc non Af Amer: 38 mL/min — ABNORMAL LOW (ref >60)
Glucose, Bld: 92 mg/dL (ref 70–99)
Potassium: 3.5 mmol/L (ref 3.5–5.1)
Sodium: 143 mmol/L (ref 135–145)

## 2017-11-22 LAB — CBG MONITORING, ED: Glucose-Capillary: 138 mg/dL — ABNORMAL HIGH (ref 70–99)

## 2017-11-22 LAB — RAPID URINE DRUG SCREEN, HOSP PERFORMED
Amphetamines: NOT DETECTED
Barbiturates: NOT DETECTED
Benzodiazepines: NOT DETECTED
Cocaine: NOT DETECTED
Opiates: NOT DETECTED
Tetrahydrocannabinol: NOT DETECTED

## 2017-11-22 LAB — ETHANOL: Alcohol, Ethyl (B): <10 mg/dL (ref <10)

## 2017-11-22 MED ORDER — POLYVINYL ALCOHOL 1.4 % OP SOLN
1.0000 [drp] | Freq: Two times a day (BID) | OPHTHALMIC | Status: DC | PRN
  Administered 2017-11-23 – 2017-11-25 (×3): 1 [drp] via OPHTHALMIC
  Filled 2017-11-22: qty 15

## 2017-11-22 MED ORDER — ONDANSETRON HCL 4 MG PO TABS: 4.0000 mg | ORAL_TABLET | Freq: Four times a day (QID) | ORAL | Status: DC | PRN

## 2017-11-22 MED ORDER — ONDANSETRON HCL 4 MG/2ML IJ SOLN: 4.0000 mg | Freq: Four times a day (QID) | INTRAMUSCULAR | Status: DC | PRN

## 2017-11-22 MED ORDER — INSULIN GLARGINE 100 UNIT/ML ~~LOC~~ SOLN
20.0000 [IU] | Freq: Every day | SUBCUTANEOUS | Status: DC
  Administered 2017-11-23 – 2017-11-24 (×2): 20 [IU] via SUBCUTANEOUS
  Filled 2017-11-22 (×3): qty 0.2

## 2017-11-22 MED ORDER — SODIUM CHLORIDE 0.9 % IV BOLUS
1000.0000 mL | Freq: Once | INTRAVENOUS | Status: AC
  Administered 2017-11-22: 1000 mL via INTRAVENOUS

## 2017-11-22 MED ORDER — SENNOSIDES-DOCUSATE SODIUM 8.6-50 MG PO TABS: 1.0000 | ORAL_TABLET | Freq: Every evening | ORAL | Status: DC | PRN

## 2017-11-22 MED ORDER — ASPIRIN EC 81 MG PO TBEC
81.0000 mg | DELAYED_RELEASE_TABLET | Freq: Every day | ORAL | Status: DC
  Administered 2017-11-23 – 2017-11-25 (×3): 81 mg via ORAL
  Filled 2017-11-22 (×3): qty 1

## 2017-11-22 MED ORDER — ZOLPIDEM TARTRATE 5 MG PO TABS: 5.0000 mg | ORAL_TABLET | Freq: Every evening | ORAL | Status: DC | PRN

## 2017-11-22 MED ORDER — MIDODRINE HCL 5 MG PO TABS
5.0000 mg | ORAL_TABLET | Freq: Two times a day (BID) | ORAL | Status: DC
  Administered 2017-11-23 – 2017-11-24 (×3): 5 mg via ORAL
  Filled 2017-11-22 (×6): qty 1

## 2017-11-22 MED ORDER — SODIUM CHLORIDE 0.9 % IV BOLUS
500.0000 mL | Freq: Once | INTRAVENOUS | Status: AC
  Administered 2017-11-22: 500 mL via INTRAVENOUS

## 2017-11-22 MED ORDER — SERTRALINE HCL 25 MG PO TABS
25.0000 mg | ORAL_TABLET | Freq: Every day | ORAL | Status: DC
  Administered 2017-11-23 – 2017-11-25 (×3): 25 mg via ORAL
  Filled 2017-11-22 (×3): qty 1

## 2017-11-22 MED ORDER — ACETAMINOPHEN 650 MG RE SUPP: 650.0000 mg | Freq: Four times a day (QID) | RECTAL | Status: DC | PRN

## 2017-11-22 MED ORDER — SODIUM CHLORIDE 0.9 % IV SOLN
INTRAVENOUS | Status: DC
  Administered 2017-11-23 – 2017-11-25 (×6): via INTRAVENOUS

## 2017-11-22 MED ORDER — CLOPIDOGREL BISULFATE 75 MG PO TABS
75.0000 mg | ORAL_TABLET | Freq: Every day | ORAL | Status: DC
  Administered 2017-11-23 – 2017-11-25 (×3): 75 mg via ORAL
  Filled 2017-11-22 (×3): qty 1

## 2017-11-22 MED ORDER — INSULIN ASPART 100 UNIT/ML ~~LOC~~ SOLN
0.0000 [IU] | Freq: Three times a day (TID) | SUBCUTANEOUS | Status: DC
  Administered 2017-11-24: 2 [IU] via SUBCUTANEOUS

## 2017-11-22 MED ORDER — ACETAMINOPHEN 325 MG PO TABS: 650.0000 mg | ORAL_TABLET | Freq: Four times a day (QID) | ORAL | Status: DC | PRN

## 2017-11-22 MED ORDER — ENOXAPARIN SODIUM 40 MG/0.4ML ~~LOC~~ SOLN
40.0000 mg | SUBCUTANEOUS | Status: DC
  Administered 2017-11-23 – 2017-11-24 (×2): 40 mg via SUBCUTANEOUS
  Filled 2017-11-22 (×2): qty 0.4

## 2017-11-22 MED ORDER — NICOTINE 21 MG/24HR TD PT24
21.0000 mg | MEDICATED_PATCH | Freq: Every day | TRANSDERMAL | Status: DC
  Administered 2017-11-23 – 2017-11-25 (×3): 21 mg via TRANSDERMAL
  Filled 2017-11-22 (×3): qty 1

## 2017-11-22 MED ORDER — ROSUVASTATIN CALCIUM 5 MG PO TABS
5.0000 mg | ORAL_TABLET | Freq: Every day | ORAL | Status: DC
  Administered 2017-11-23 – 2017-11-25 (×3): 5 mg via ORAL
  Filled 2017-11-22 (×3): qty 1

## 2017-11-22 NOTE — ED Notes (Addendum)
Pt reports he was sitting in the porch today, when he was going in the house, he started to feel hot and "I went out."  He states " I tried to get up after going out."  He denies SOB or cp.  Denies any pain.  He reports feeling drowsy.  He is A&Ox 4.  In NAD.  Pt was admitted to the hospital last Friday for orthostatic hypotension.

## 2017-11-22 NOTE — ED Notes (Signed)
Patient transported to CT 

## 2017-11-22 NOTE — ED Triage Notes (Signed)
Per EMS, pt from home, family reported pt was just getting in the house after working in the yard today, c/o feeling hot prior to syncope.  Vomited x 1 after the fall.  He is on Plavix.  Family reported pt hit his head on a chair.  No hematoma noted per EMS.  He is A&Ox 4.
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
## 2017-11-22 NOTE — ED Notes (Signed)
Condom Cath placed
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
## 2017-11-23 ENCOUNTER — Other Ambulatory Visit: Payer: Self-pay

## 2017-11-23 ENCOUNTER — Observation Stay (HOSPITAL_COMMUNITY): Payer: Medicare Other

## 2017-11-23 DIAGNOSIS — I951 Orthostatic hypotension: Secondary | ICD-10-CM | POA: Diagnosis not present

## 2017-11-23 DIAGNOSIS — M50221 Other cervical disc displacement at C4-C5 level: Secondary | ICD-10-CM | POA: Diagnosis not present

## 2017-11-23 DIAGNOSIS — D649 Anemia, unspecified: Secondary | ICD-10-CM | POA: Diagnosis not present

## 2017-11-23 DIAGNOSIS — F17200 Nicotine dependence, unspecified, uncomplicated: Secondary | ICD-10-CM | POA: Diagnosis not present

## 2017-11-23 DIAGNOSIS — Z794 Long term (current) use of insulin: Secondary | ICD-10-CM | POA: Diagnosis not present

## 2017-11-23 DIAGNOSIS — I63449 Cerebral infarction due to embolism of unspecified cerebellar artery: Secondary | ICD-10-CM | POA: Diagnosis not present

## 2017-11-23 DIAGNOSIS — E1122 Type 2 diabetes mellitus with diabetic chronic kidney disease: Secondary | ICD-10-CM | POA: Diagnosis not present

## 2017-11-23 DIAGNOSIS — E785 Hyperlipidemia, unspecified: Secondary | ICD-10-CM | POA: Diagnosis not present

## 2017-11-23 DIAGNOSIS — R55 Syncope and collapse: Secondary | ICD-10-CM | POA: Diagnosis not present

## 2017-11-23 DIAGNOSIS — N183 Chronic kidney disease, stage 3 (moderate): Secondary | ICD-10-CM | POA: Diagnosis not present

## 2017-11-23 LAB — GLUCOSE, CAPILLARY
Glucose-Capillary: 110 mg/dL — ABNORMAL HIGH (ref 70–99)
Glucose-Capillary: 100 mg/dL — ABNORMAL HIGH (ref 70–99)
Glucose-Capillary: 111 mg/dL — ABNORMAL HIGH (ref 70–99)
Glucose-Capillary: 146 mg/dL — ABNORMAL HIGH (ref 70–99)

## 2017-11-23 NOTE — Progress Notes (Signed)
PROGRESS NOTE    Patient: Harry [name]                            PCP: [name], MD                    DOB: 10-29-1945            DOA: 11/22/2017 UMP:[id number]             DOS: 11/23/2017, 1:15 PM   LOS: 0 days   Date of Service: The patient was seen and examined on 11/23/2017  Subjective:   Was seen and examined this morning, stable no acute distress. No issues overnight No episode of passing out overnight.  Brief Narrative:  Harry [name] is a 72 y.o. male with medical history significant of hypertension, hyperlipidemia, stroke with gait instability, glaucoma, CKD 3, PVD (s/p of left SFA and stent placement), tobacco abuse, syncope, depression, who presents with syncope.  Patient was recently hospitalized from 9/13-9/15 due to syncope.  Patient had negative work-up including negative MRI of brain, carotid artery Doppler and normal EF on 2D echo.  Patient had orthostatic status in that admission, which was considered as possible etiology for syncope. He was advised to liberalize his salt intake because of significant hypotension when standing. He was started with Midodrine 5 mg daily by PCP yesterday.  Patient had 2 episodes of syncope at about 2:30 pm again on the day of admission. No focal neurological findings, chronic gait instability from previous CVA.  Per ED valuation patient was orthostatic, otherwise work-up negative. CT head/C-spine for any acute abnormalities old left cerebellar and left PCA territory infarct, disc protrusion at C4-5 substantial mass-effect on the central canal narrowing AP diamer.   Principal Problem:   Syncope Active Problems:   Type 2 diabetes mellitus with stage 3 chronic kidney disease, with long-term current use of insulin (HCC)   Hyperlipidemia LDL goal <70   TOBACCO USE   Essential hypertension   PAD (peripheral artery disease) (HCC)   Stroke (cerebrum) (HCC)   Normocytic anemia   Orthostatic hypotension    Assessment &  Plan:    Syncope:  -Recurrent, unknown etiology, -Recent work-up negative including MRI of brain, carotid artery duplex, 2D echocardiogram, normal EJF.,    Pt has positive orthostatic vital sign ED, which is likely the etiology.  CT of C-spine showed large left paracentral broad-based disc protrusion at C4-5 generates substantial mass-effect on the central canal narrowing AP diameter at this level to 4-5 mm.   -Pending EEG, continue neurochecks, gentle IV fluid hydration, - Midodrine 5 mg bid - Educated pt to stand up slowly and advised to liberalize his salt intake. - PT/OT eval and treat - MRI of C spin - May need to ask Card to put pt on 30-day heart monitoring at discharge.   Type 2 diabetes mellitus with stage 3 chronic kidney disease, with long-term current use of insulin ([place]): - Last A1c 6.8 on 11/15/16, well controled. Patient is taking glargine insulin at home. -will decrease glargine insulin dose from 30 to 20 units daily -SSI  Hyperlipidemia LDL goal <70: -crestor  TOBACCO USE: -nicotine patch  Essential hypertension: Not on medications. -will allow permissive hypertension due to orthostatic status and syncope  PAD (peripheral artery disease) ([place]):  -s/p of Left SFA -on ASA, Plavix, Crestor  Stroke (cerebrum) ([place]): -Aspirin, Plavix and Crestor  Normocytic anemia: Hemoglobin stable, 11.0. -  Monitoring H&H  CKD-III: Slightly worsening.  Baseline 1.3-1.5.  His creatinine is 1.70, BUN 20. -IV fluid as above.     DVT prophylaxis: Lovenox SQ  Code Status:   Code Status: Full Code  Family Communication:  The above findings and plan of care has been discussed with patient and family in detail, they expressed understanding and agreement of above.  Disposition Plan:   Anticipated 1-2 days  Consultants: None Admission status: Obs / tele  Procedures:   No admission procedures for hospital encounter.     Antimicrobials:  Anti-infectives (From  admission, onward)   None       Medication:  . aspirin EC  81 mg Oral Daily  . clopidogrel  75 mg Oral Daily  . enoxaparin (LOVENOX) injection  40 mg Subcutaneous Q24H  . insulin aspart  0-9 Units Subcutaneous TID WC  . insulin glargine  20 Units Subcutaneous QHS  . midodrine  5 mg Oral BID WC  . nicotine  21 mg Transdermal Daily  . rosuvastatin  5 mg Oral Daily  . sertraline  25 mg Oral Daily    acetaminophen **OR** acetaminophen, ondansetron **OR** ondansetron (ZOFRAN) IV, polyvinyl alcohol, senna-docusate, zolpidem     Objective:   Vitals:   11/22/17 2330 11/23/17 0007 11/23/17 0512 11/23/17 1154  BP: (!) 165/91 (!) 151/86 125/80 (!) 145/78  Pulse: (!) 110  (!) 104 94  Resp: 14 15 14 20   Temp:  (!) 97.4 F (36.3 C) 98.1 F (36.7 C)   TempSrc:  Oral Oral   SpO2: 100% 98% 98%   Weight:  80.3 kg    Height:  6' (1.829 m)      Intake/Output Summary (Last 24 hours) at 11/23/2017 1315 Last data filed at 11/23/2017 0300 Gross per 24 hour  Intake 1757.56 ml  Output -  Net 1757.56 ml   Filed Weights   11/23/17 0007  Weight: 80.3 kg     Examination:    General exam: Appears calm and comfortable  BP (!) 145/78 (BP Location: Left Arm)   Pulse 94   Temp 98.1 F (36.7 C) (Oral)   Resp 20   Ht 6' (1.829 m)   Wt 80.3 kg   SpO2 98%   BMI 24.01 kg/m    Physical Exam  Constitution:  Alert, cooperative, no distress,  Psychiatric: Normal and stable mood and affect, cognition intact,   HEENT: Normocephalic, PERRL, otherwise with in Normal limits  Chest:Chest symmetric Cardio vascular:  S1/S2, RRR, No murmure, No Rubs or Gallops  pulmonary: Clear to auscultation bilaterally, respirations unlabored, negative wheezes / crackles Abdomen: Soft, non-tender, non-distended, bowel sounds,no masses, no organomegaly Muscular skeletal: Limited exam - in bed, able to move all 4 extremities, Normal strength, chronic gait disturbances from previous stroke Neuro: CNII-XII  intact. , normal motor and sensation, reflexes intact  Extremities: No pitting edema lower extremities, +2 pulses  Skin: Dry, warm to touch, negative for any Rashes, No open wounds Wounds: per nursing documentation  LABs:  CBC Latest Ref Rng & Units 11/22/2017 11/17/2017 11/15/2017  WBC 4.0 - 10.5 K/uL 3.7(L) 5.5 6.0  Hemoglobin 13.0 - 17.0 g/dL 11.0(L) 10.6(L) 11.5(L)  Hematocrit 39.0 - 52.0 % 34.6(L) 32.7(L) 36.3(L)  Platelets 150 - 400 K/uL 234 225 249   CMP Latest Ref Rng & Units 11/22/2017 11/22/2017 11/17/2017  Glucose 70 - 99 mg/dL 93 92 151(H)  BUN 8 - 23 mg/dL 20 21 18   Creatinine 0.61 - 1.24 mg/dL 1.68(H) 1.73(H) 1.34(H)  Sodium 135 - 145 mmol/L 143 143 138  Potassium 3.5 - 5.1 mmol/L 4.3 3.5 4.0  Chloride 98 - 111 mmol/L 110 109 108  CO2 22 - 32 mmol/L 23 23 23   Calcium 8.9 - 10.3 mg/dL 8.6(L) 8.6(L) 8.3(L)  Total Protein 6.5 - 8.1 g/dL 7.0 - -  Total Bilirubin 0.3 - 1.2 mg/dL 1.5(H) - -  Alkaline Phos 38 - 126 U/L 57 - -  AST 15 - 41 U/L 26 - -  ALT 0 - 44 U/L 13 - -        @IMAGES @

## 2017-11-23 NOTE — Progress Notes (Addendum)
Pt arrived from ED to 4E21. Tele applied and verified x2. CHG bath done. VSS; BP 151/86 HR 109. Pt denies any pain or SOB. Neuro intact. Pt updated on plan of care, went over safety plan. Bed alarm on/mats on floor. Oriented to room and call light.  Jaymes Graff, RN

## 2017-11-23 NOTE — Progress Notes (Signed)
Pt c/o inability to fully empty bladder. Multiple attempts to void in different positions, sitting/standing. Bladder scan revealed 600 mLs retained. Received order for straight cath. 700 mLs clear yellow urine retained. Pt reports relief from intervention. Pt denies prior issues w/ urinating, and has been urinating spontaneously w/ condom cath since admission. Will continue to closely monitor pt.  Jaymes Graff, RN

## 2017-11-23 NOTE — Progress Notes (Signed)
PT Cancellation Note  Patient Details Name: Harry [name] MRN: [id number] DOB: [birth year]   Cancelled Treatment:    Reason Eval/Treat Not Completed: Patient at procedure or test/unavailable. Pt in MRI.   Shary Decamp [facility] 11/23/2017, 11:06 AM [place] Pager [phone] Office [phone]

## 2017-11-23 NOTE — Evaluation (Signed)
Physical Therapy Evaluation Patient Details Name: Harry [name] MRN: [id number] DOB: [birth year] Today's Date: 11/23/2017   History of Present Illness  Pt adm with recurrent syncope. Orthostatic in ED. PMH of kidney disease, hypertension, peripheral vascular disease, stroke, diabetes type 2 and dementia.  Clinical Impression  Pt presents to PT with significant orthostatic hypotension. Will remain high fall risk if not resolved. Will follow.  Orthostatic BPs  Supine 190/98 HR 92  Sitting 148/75 HR 96  Standing 125/58 HR 100  Standing after 3 min 108/62 HR 102  After amb 119/70       Follow Up Recommendations Home health PT;Supervision/Assistance - 24 hour    Equipment Recommendations  None recommended by PT    Recommendations for Other Services       Precautions / Restrictions Precautions Precautions: Fall Precaution Comments: orthostatic Restrictions Weight Bearing Restrictions: No      Mobility  Bed Mobility Overal bed mobility: Needs Assistance Bed Mobility: Supine to Sit;Sit to Supine     Supine to sit: Min guard;HOB elevated Sit to supine: Min assist   General bed mobility comments: Incr time and multiple verbal cues to come to sitting EOB. Assist to bring feet back up into bed when returning to supine.   Transfers Overall transfer level: Needs assistance Equipment used: 4-wheeled walker Transfers: Sit to/from Stand Sit to Stand: Min assist         General transfer comment: Assist for balance and to bring hips up  Ambulation/Gait Ambulation/Gait assistance: Min assist Gait Distance (Feet): 25 Feet Assistive device: 4-wheeled walker Gait Pattern/deviations: Step-through pattern;Decreased step length - right;Decreased step length - left;Trunk flexed Gait velocity: decr Gait velocity interpretation: <1.31 ft/sec, indicative of household ambulator General Gait Details: assist for balance and support. Verbal/tactile cues to stand more erect and stay  closer to the walker  Stairs            Wheelchair Mobility    Modified Rankin (Stroke Patients Only)       Balance Overall balance assessment: Needs assistance Sitting-balance support: No upper extremity supported;Feet supported Sitting balance-Leahy Scale: Fair     Standing balance support: Bilateral upper extremity supported Standing balance-Leahy Scale: Poor Standing balance comment: walker and min guard for static standing                             Pertinent Vitals/Pain Pain Assessment: No/denies pain    Home Living Family/patient expects to be discharged to:: Private residence Living Arrangements: Children Available Help at Discharge: Family;Available 24 hours/day Type of Home: House Home Access: Level entry     Home Layout: Two level Home Equipment: Walker - 2 wheels;Walker - 4 wheels;Cane - single point Additional Comments: Most equipment was for his wife    Prior Function Level of Independence: Needs assistance   Gait / Transfers Assistance Needed: amb in house without assistive device most of time. Assist since recent hospitalization           Hand Dominance   Dominant Hand: Right    Extremity/Trunk Assessment   Upper Extremity Assessment Upper Extremity Assessment: Defer to OT evaluation    Lower Extremity Assessment Lower Extremity Assessment: Generalized weakness       Communication   Communication: No difficulties  Cognition Arousal/Alertness: Awake/alert Behavior During Therapy: Flat affect Overall Cognitive Status: History of cognitive impairments - at baseline  General Comments      Exercises     Assessment/Plan    PT Assessment Patient needs continued PT services  PT Problem List Decreased strength;Decreased activity tolerance;Decreased balance;Decreased mobility;Decreased cognition;Decreased knowledge of use of DME;Cardiopulmonary status limiting  activity       PT Treatment Interventions DME instruction;Gait training;Functional mobility training;Therapeutic activities;Therapeutic exercise;Balance training;Patient/family education    PT Goals (Current goals can be found in the Care Plan section)  Acute Rehab PT Goals Patient Stated Goal: not stated PT Goal Formulation: With patient/family Time For Goal Achievement: 11/30/17 Potential to Achieve Goals: Fair    Frequency Min 3X/week   Barriers to discharge        Co-evaluation               AM-PAC PT "6 Clicks" Daily Activity  Outcome Measure Difficulty turning over in bed (including adjusting bedclothes, sheets and blankets)?: A Little Difficulty moving from lying on back to sitting on the side of the bed? : A Little Difficulty sitting down on and standing up from a chair with arms (e.g., wheelchair, bedside commode, etc,.)?: Unable Help needed moving to and from a bed to chair (including a wheelchair)?: A Little Help needed walking in hospital room?: A Little Help needed climbing 3-5 steps with a railing? : Total 6 Click Score: 14    End of Session Equipment Utilized During Treatment: Gait belt Activity Tolerance: Treatment limited secondary to medical complications (Comment)(orthostatic) Patient left: in bed;with call bell/phone within reach;with bed alarm set;with family/visitor present Nurse Communication: Mobility status;Other (comment)(orthostatic) PT Visit Diagnosis: Unsteadiness on feet (R26.81);Other abnormalities of gait and mobility (R26.89);History of falling (Z91.81)    Time: 5573-2202 PT Time Calculation (min) (ACUTE ONLY): 22 min   Charges:   PT Evaluation $PT Eval Moderate Complexity: [place] Pager [phone] Office [place] 11/23/2017, 4:02 PM

## 2017-11-24 DIAGNOSIS — Z833 Family history of diabetes mellitus: Secondary | ICD-10-CM | POA: Diagnosis not present

## 2017-11-24 DIAGNOSIS — E1122 Type 2 diabetes mellitus with diabetic chronic kidney disease: Secondary | ICD-10-CM | POA: Diagnosis not present

## 2017-11-24 DIAGNOSIS — M4800 Spinal stenosis, site unspecified: Secondary | ICD-10-CM | POA: Diagnosis present

## 2017-11-24 DIAGNOSIS — H409 Unspecified glaucoma: Secondary | ICD-10-CM | POA: Diagnosis present

## 2017-11-24 DIAGNOSIS — Z8249 Family history of ischemic heart disease and other diseases of the circulatory system: Secondary | ICD-10-CM | POA: Diagnosis not present

## 2017-11-24 DIAGNOSIS — F329 Major depressive disorder, single episode, unspecified: Secondary | ICD-10-CM | POA: Diagnosis present

## 2017-11-24 DIAGNOSIS — Z7902 Long term (current) use of antithrombotics/antiplatelets: Secondary | ICD-10-CM | POA: Diagnosis not present

## 2017-11-24 DIAGNOSIS — F17210 Nicotine dependence, cigarettes, uncomplicated: Secondary | ICD-10-CM | POA: Diagnosis present

## 2017-11-24 DIAGNOSIS — E1142 Type 2 diabetes mellitus with diabetic polyneuropathy: Secondary | ICD-10-CM | POA: Diagnosis present

## 2017-11-24 DIAGNOSIS — T444X5A Adverse effect of predominantly alpha-adrenoreceptor agonists, initial encounter: Secondary | ICD-10-CM | POA: Diagnosis not present

## 2017-11-24 DIAGNOSIS — D649 Anemia, unspecified: Secondary | ICD-10-CM | POA: Diagnosis not present

## 2017-11-24 DIAGNOSIS — Z9841 Cataract extraction status, right eye: Secondary | ICD-10-CM | POA: Diagnosis not present

## 2017-11-24 DIAGNOSIS — R55 Syncope and collapse: Secondary | ICD-10-CM | POA: Diagnosis not present

## 2017-11-24 DIAGNOSIS — D631 Anemia in chronic kidney disease: Secondary | ICD-10-CM | POA: Diagnosis present

## 2017-11-24 DIAGNOSIS — H5462 Unqualified visual loss, left eye, normal vision right eye: Secondary | ICD-10-CM | POA: Diagnosis present

## 2017-11-24 DIAGNOSIS — Z888 Allergy status to other drugs, medicaments and biological substances status: Secondary | ICD-10-CM | POA: Diagnosis not present

## 2017-11-24 DIAGNOSIS — Z9842 Cataract extraction status, left eye: Secondary | ICD-10-CM | POA: Diagnosis not present

## 2017-11-24 DIAGNOSIS — I129 Hypertensive chronic kidney disease with stage 1 through stage 4 chronic kidney disease, or unspecified chronic kidney disease: Secondary | ICD-10-CM | POA: Diagnosis present

## 2017-11-24 DIAGNOSIS — Z8673 Personal history of transient ischemic attack (TIA), and cerebral infarction without residual deficits: Secondary | ICD-10-CM | POA: Diagnosis not present

## 2017-11-24 DIAGNOSIS — R339 Retention of urine, unspecified: Secondary | ICD-10-CM | POA: Diagnosis not present

## 2017-11-24 DIAGNOSIS — E1151 Type 2 diabetes mellitus with diabetic peripheral angiopathy without gangrene: Secondary | ICD-10-CM | POA: Diagnosis present

## 2017-11-24 DIAGNOSIS — I951 Orthostatic hypotension: Secondary | ICD-10-CM | POA: Diagnosis not present

## 2017-11-24 DIAGNOSIS — I739 Peripheral vascular disease, unspecified: Secondary | ICD-10-CM | POA: Diagnosis not present

## 2017-11-24 DIAGNOSIS — Z7982 Long term (current) use of aspirin: Secondary | ICD-10-CM | POA: Diagnosis not present

## 2017-11-24 DIAGNOSIS — I10 Essential (primary) hypertension: Secondary | ICD-10-CM | POA: Diagnosis not present

## 2017-11-24 DIAGNOSIS — I63449 Cerebral infarction due to embolism of unspecified cerebellar artery: Secondary | ICD-10-CM | POA: Diagnosis not present

## 2017-11-24 DIAGNOSIS — Z794 Long term (current) use of insulin: Secondary | ICD-10-CM | POA: Diagnosis not present

## 2017-11-24 DIAGNOSIS — N183 Chronic kidney disease, stage 3 (moderate): Secondary | ICD-10-CM | POA: Diagnosis not present

## 2017-11-24 DIAGNOSIS — E785 Hyperlipidemia, unspecified: Secondary | ICD-10-CM | POA: Diagnosis present

## 2017-11-24 LAB — GLUCOSE, CAPILLARY
Glucose-Capillary: 112 mg/dL — ABNORMAL HIGH (ref 70–99)
Glucose-Capillary: 119 mg/dL — ABNORMAL HIGH (ref 70–99)
Glucose-Capillary: 159 mg/dL — ABNORMAL HIGH (ref 70–99)
Glucose-Capillary: 162 mg/dL — ABNORMAL HIGH (ref 70–99)

## 2017-11-24 NOTE — Progress Notes (Signed)
Per pt daughter, midodrine makes pt unable to void. Pt with complaints of unable to void today. Bladder scan with 147ml in bladder. Condom cath on. MD notified and instructed to hold midodrine. Will assess pt ability to void and monitor BP off midodrine.  Amanda Cockayne, RN

## 2017-11-24 NOTE — Care Management Note (Signed)
Case Management Note  Patient Details  Name: Harry [name] MRN: [id number] Date of Birth: Aug 07, 1945  Subjective/Objective:                 fall   Action/Plan:  Spoke with patient. Has cane and walker at home, is active w [facility] for PT. Will need resumption orders if changed to inpatient. Patient lives with daughter who he states is a Marine scientist at [employer]   Expected Discharge Date:  11/25/17               Expected Discharge Plan:     In-House Referral:     Discharge planning Services  CM Consult  Post Acute Care Choice:    Choice offered to:     DME Arranged:    DME Agency:     HH Arranged:    [place] Agency:  [facility] ([facility]  Status of Service:  In process, will continue to follow  If discussed at Long Length of Stay Meetings, dates discussed:    Additional Comments:  Carles Collet, RN 11/24/2017, 9:31 AM

## 2017-11-24 NOTE — Care Management Obs Status (Signed)
[place] NOTIFICATION   Patient Details  Name: Harry [name] MRN: [id number] Date of Birth: [birth year]   Medicare Observation Status Notification Given:  Yes    Carles Collet, RN 11/24/2017, 9:29 AM

## 2017-11-24 NOTE — Evaluation (Signed)
Occupational Therapy Evaluation Patient Details Name: Harry [name] MRN: [id number] DOB: June 28, 1945 Today's Date: 11/24/2017    History of Present Illness Pt adm with recurrent syncope. Orthostatic in ED. PMH of kidney disease, hypertension, peripheral vascular disease, stroke, diabetes type 2 and dementia.   Clinical Impression   Pt admitted with above. He demonstrates the below listed deficits and will benefit from continued OT to maximize safety and independence with BADLs.  Pt with significantly limited activity tolerance due to orthostatic hypotension.  He requires min A for ADLs.  PTA, he lived with daughter who assisted with IADLs.   Will continue to follow acutely, but pt will continue to be at risk for falls until orthostasis improved.  He may need to consider use of w/c if he doesn't improve significantly.    BP supine                 180/62   HR 88 Sitting                        152/78    HR 90 Sitting after 2 mins    140/75 HR 89 Sitting after 4 mins    129/72  Standing                     109/70 (pt returned to sitting After transfer to chair 116/72       Follow Up Recommendations  Home health OT;Supervision/Assistance - 24 hour    Equipment Recommendations  None recommended by OT    Recommendations for Other Services       Precautions / Restrictions Precautions Precautions: Fall Precaution Comments: orthostatic Restrictions Weight Bearing Restrictions: No      Mobility Bed Mobility Overal bed mobility: Needs Assistance Bed Mobility: Supine to Sit;Sit to Supine     Supine to sit: Min guard;HOB elevated     General bed mobility comments: increased time.  Moves slowly   Transfers Overall transfer level: Needs assistance Equipment used: Rolling walker (2 wheeled) Transfers: Sit to/from Stand Sit to Stand: Min assist         General transfer comment: Pt requires cues for hand placement.  Required assist to move into standing     Balance Overall  balance assessment: Needs assistance Sitting-balance support: No upper extremity supported;Feet supported Sitting balance-Leahy Scale: Good Sitting balance - Comments: able to reach toward the floor    Standing balance support: Bilateral upper extremity supported Standing balance-Leahy Scale: Poor Standing balance comment: requires UE support                            ADL either performed or assessed with clinical judgement   ADL Overall ADL's : Needs assistance/impaired Eating/Feeding: Independent   Grooming: Wash/dry hands;Wash/dry face;Oral care;Brushing hair;Set up;Supervision/safety;Sitting   Upper Body Bathing: Supervision/ safety;Set up;Sitting   Lower Body Bathing: Sit to/from stand;Minimal assistance   Upper Body Dressing : Set up;Supervision/safety;Sitting   Lower Body Dressing: Minimal assistance;Sit to/from stand   Toilet Transfer: Minimal assistance;Stand-pivot;BSC   Toileting- Clothing Manipulation and Hygiene: Minimal assistance;Sit to/from stand       Functional mobility during ADLs: Min guard;Minimal assistance;Rolling walker General ADL Comments: assist for balance and safety      Vision   Additional Comments: Pt denies visual changes      Perception     Praxis      Pertinent Vitals/Pain Pain Assessment: No/denies pain  Hand Dominance Right   Extremity/Trunk Assessment Upper Extremity Assessment Upper Extremity Assessment: Generalized weakness   Lower Extremity Assessment Lower Extremity Assessment: Defer to PT evaluation   Cervical / Trunk Assessment Cervical / Trunk Assessment: Normal   Communication Communication Communication: HOH   Cognition Arousal/Alertness: Awake/alert Behavior During Therapy: Flat affect Overall Cognitive Status: History of cognitive impairments - at baseline                                 General Comments: No family present.  Pt with h/o dementia.  He is somewhat evasive with  questioning, and requires mod cues to sequence through transfers    General Comments       Exercises     Shoulder Instructions      Home Living Family/patient expects to be discharged to:: Private residence Living Arrangements: Children Available Help at Discharge: Family;Available 24 hours/day Type of Home: House Home Access: Level entry     Home Layout: Two level Alternate Level Stairs-Number of Steps: 8 Alternate Level Stairs-Rails: Right;Left Bathroom Shower/Tub: Teacher, early years/pre: Standard     Home Equipment: Environmental consultant - 2 wheels;Walker - 4 wheels;Cane - single point   Additional Comments: Most equipment was for his wife      Prior Functioning/Environment Level of Independence: Needs assistance  Gait / Transfers Assistance Needed: amb in house without assistive device most of time. Assist since recent hospitalization ADL's / Homemaking Assistance Needed: Pt reports he sponge bathes and is able to perform ADLs mod I             OT Problem List: Decreased strength;Decreased activity tolerance;Impaired balance (sitting and/or standing);Decreased safety awareness;Decreased knowledge of use of DME or AE;Cardiopulmonary status limiting activity      OT Treatment/Interventions: Self-care/ADL training;DME and/or AE instruction;Therapeutic activities;Cognitive remediation/compensation;Patient/family education;Balance training    OT Goals(Current goals can be found in the care plan section) Acute Rehab OT Goals Patient Stated Goal: Pt did not state.  Just says he wants to eat dinner  OT Goal Formulation: With patient Time For Goal Achievement: 12/08/17 Potential to Achieve Goals: Good ADL Goals Pt Will Perform Lower Body Bathing: with min guard assist;sit to/from stand Pt Will Perform Lower Body Dressing: with min guard assist;sit to/from stand Pt Will Transfer to Toilet: with min guard assist;ambulating;regular height toilet;bedside commode;grab bars Pt  Will Perform Toileting - Clothing Manipulation and hygiene: with min guard assist;sit to/from stand  OT Frequency: Min 2X/week   Barriers to D/C:            Co-evaluation              AM-PAC PT "6 Clicks" Daily Activity     Outcome Measure Help from another person eating meals?: None Help from another person taking care of personal grooming?: A Little Help from another person toileting, which includes using toliet, bedpan, or urinal?: A Little Help from another person bathing (including washing, rinsing, drying)?: A Little Help from another person to put on and taking off regular upper body clothing?: None Help from another person to put on and taking off regular lower body clothing?: A Little 6 Click Score: 20   End of Session Equipment Utilized During Treatment: Rolling walker;Gait belt Nurse Communication: Mobility status  Activity Tolerance: Treatment limited secondary to medical complications (Comment) Patient left: in chair;with call bell/phone within reach  OT Visit Diagnosis: Unsteadiness on feet (R26.81);Cognitive communication deficit (R41.841)  Time: 6333-5456 OT Time Calculation (min): 17 min Charges:  OT General Charges $OT Visit: 1 Visit OT Evaluation $OT Eval Moderate Complexity: 1 Mod  Lucille Passy, OTR/L Acute Rehabilitation Services Pager [phone] Office [phone]   Lucille Passy M 11/24/2017, 6:18 PM

## 2017-11-24 NOTE — Progress Notes (Signed)
PROGRESS NOTE    Patient: Harry [name]                            PCP: [name], MD                    DOB: Jan 26, 1946            DOA: 11/22/2017 BOF:[id number]             DOS: 11/24/2017, 10:17 AM   LOS: 0 days   Date of Service: The patient was seen and examined on 11/24/2017  Subjective:   Patient was seen and examined this morning, stable.  Not complaining of any chest pain or shortness of breath. Not complaining any overt back pain, or paresthesia.  He does states that he loses his bladder at times without control.   Brief Narrative:  Harry [name] is a 72 y.o. male with medical history significant of hypertension, hyperlipidemia, stroke with gait instability, glaucoma, CKD 3, PVD (s/p of left SFA and stent placement), tobacco abuse, syncope, depression, who presents with syncope.  Patient was recently hospitalized from 9/13-9/15 due to syncope.  Patient had negative work-up including negative MRI of brain, carotid artery Doppler and normal EF on 2D echo.  Patient had orthostatic status in that admission, which was considered as possible etiology for syncope. He was advised to liberalize his salt intake because of significant hypotension when standing. He was started with Midodrine 5 mg daily by PCP yesterday.  Patient had 2 episodes of syncope at about 2:30 pm again on the day of admission. No focal neurological findings, chronic gait instability from previous CVA.  Per ED valuation patient was orthostatic, otherwise work-up negative. CT head/C-spine for any acute abnormalities old left cerebellar and left PCA territory infarct, disc protrusion at C4-5 substantial mass-effect on the central canal narrowing AP diamer.   Principal Problem:   Syncope Active Problems:   Type 2 diabetes mellitus with stage 3 chronic kidney disease, with long-term current use of insulin (HCC)   Hyperlipidemia LDL goal <70   TOBACCO USE   Essential hypertension   PAD (peripheral  artery disease) (HCC)   Stroke (cerebrum) (HCC)   Normocytic anemia   Orthostatic hypotension    Assessment & Plan:    Syncope:  -Recurrent, ? etiology, likely due to orthostatic hypotension -Recent work-up negative including MRI of brain, carotid artery duplex, 2D echocardiogram, normal EJF.,    Pt has positive orthostatic vital sign ED, which is likely the etiology.    CT of C-spine showed large left paracentral broad-based disc protrusion at C4-5 generates substantial mass-effect on the central canal narrowing AP diameter at this level to 4-5 mm.  MRI of the cervical spine was reviewed-concerning for left C4-C5 spinal cord compression, myelopathy, central disc protrusion C3-4 compressing spinal cord extensive old posterior circulation infarct.  We do not think the above findings to be true syncope. Plan for above finding neurosurgery Dr. Vertell Limber was consulted-appreciate his input.  Likely patient needs close monitoring follow-up with an outpatient for possible surgical intervention.   -Pending EEG, continue neurochecks, gentle IV fluid hydration, - Midodrine 5 mg bid - Educated pt to stand up slowly and advised to liberalize his salt intake. - PT/OT eval and treat -Need outpatient Holter monitor, on discharge.  Type 2 diabetes mellitus with stage 3 chronic kidney disease, with long-term current use of insulin ([place]): - Last A1c 6.8 on  11/15/16, well controled. Patient is taking glargine insulin at home. -will decrease glargine insulin dose from 30 to 20 units daily -SSI  Hyperlipidemia LDL goal <70: -crestor  TOBACCO USE: -nicotine patch  Essential hypertension: Not on medications. -will allow permissive hypertension due to orthostatic status and syncope  PAD (peripheral artery disease) ([place]):  -s/p of Left SFA -on ASA, Plavix, Crestor  Stroke (cerebrum) ([place]): -Aspirin, Plavix and Crestor  Normocytic anemia: Hemoglobin stable, 11.0. - Monitoring  H&H  CKD-III: Slightly worsening.  Baseline 1.3-1.5.  His creatinine is 1.70, BUN 20. -IV fluid as above.   DVT prophylaxis: Lovenox SQ  Code Status:   Code Status: Full Code  Family Communication:  The above findings and plan of care has been discussed with patient , he expressed understanding and agreement of above.  Disposition Plan: Likely given discharge in a.m. 11/25/2017 -Need to follow with cardiology, PCP for placement of prolonged Holter monitoring. -Patient is to follow-up with neurosurgery Dr. Vertell Limber, for near future spinal decompression, is to be off his Plavix for 5 days prior to intervention.    Consultants: Neurosurgery, Dr. Vertell Limber  Admission status: Obs / tele Procedures:   No admission procedures for hospital encounter.     Antimicrobials:  Anti-infectives (From admission, onward)   None       Medication:  . aspirin EC  81 mg Oral Daily  . clopidogrel  75 mg Oral Daily  . enoxaparin (LOVENOX) injection  40 mg Subcutaneous Q24H  . insulin aspart  0-9 Units Subcutaneous TID WC  . insulin glargine  20 Units Subcutaneous QHS  . midodrine  5 mg Oral BID WC  . nicotine  21 mg Transdermal Daily  . rosuvastatin  5 mg Oral Daily  . sertraline  25 mg Oral Daily    acetaminophen **OR** acetaminophen, ondansetron **OR** ondansetron (ZOFRAN) IV, polyvinyl alcohol, senna-docusate, zolpidem     Objective:   Vitals:   11/23/17 1154 11/23/17 2110 11/24/17 0341 11/24/17 0855  BP: (!) 145/78 (!) 155/81 (!) 170/86 (!) 157/103  Pulse: 94  87   Resp: 20 20 20 19   Temp:  98 F (36.7 C) (!) 97.5 F (36.4 C)   TempSrc:  Oral Oral   SpO2:  100% 98%   Weight:      Height:        Intake/Output Summary (Last 24 hours) at 11/24/2017 1017 Last data filed at 11/24/2017 0341 Gross per 24 hour  Intake 1909.68 ml  Output 3020 ml  Net -1110.32 ml   Filed Weights   11/23/17 0007  Weight: 80.3 kg     Examination:   BP (!) 157/103   Pulse 87   Temp (!) 97.5  F (36.4 C) (Oral)   Resp 19   Ht 6' (1.829 m)   Wt 80.3 kg   SpO2 98%   BMI 24.01 kg/m    Physical Exam  Constitution:  Alert, cooperative, no distress,  Psychiatric: Normal and stable mood and affect, cognition intact,   HEENT: Normocephalic, PERRL, otherwise with in Normal limits  Chest:Chest symmetric Cardio vascular:  S1/S2, RRR, No murmure, No Rubs or Gallops  pulmonary: Clear to auscultation bilaterally, respirations unlabored, negative wheezes / crackles Abdomen: Soft, non-tender, non-distended, bowel sounds,no masses, no organomegaly Muscular skeletal: Limited exam - in bed, able to move all 4 extremities, gait abnormality noted, asymmetric weaknesses right greater than left (chronic) Neuro: CNII-XII intact. , normal motor and sensation, reflexes intact  Extremities: No pitting edema lower extremities, +2 pulses  Skin: Dry, warm to touch, negative for any Rashes, No open wounds Wounds: per nursing documentation  LABs:  CBC Latest Ref Rng & Units 11/22/2017 11/17/2017 11/15/2017  WBC 4.0 - 10.5 K/uL 3.7(L) 5.5 6.0  Hemoglobin 13.0 - 17.0 g/dL 11.0(L) 10.6(L) 11.5(L)  Hematocrit 39.0 - 52.0 % 34.6(L) 32.7(L) 36.3(L)  Platelets 150 - 400 K/uL 234 225 249   CMP Latest Ref Rng & Units 11/22/2017 11/22/2017 11/17/2017  Glucose 70 - 99 mg/dL 93 92 151(H)  BUN 8 - 23 mg/dL 20 21 18   Creatinine 0.61 - 1.24 mg/dL 1.68(H) 1.73(H) 1.34(H)  Sodium 135 - 145 mmol/L 143 143 138  Potassium 3.5 - 5.1 mmol/L 4.3 3.5 4.0  Chloride 98 - 111 mmol/L 110 109 108  CO2 22 - 32 mmol/L 23 23 23   Calcium 8.9 - 10.3 mg/dL 8.6(L) 8.6(L) 8.3(L)  Total Protein 6.5 - 8.1 g/dL 7.0 - -  Total Bilirubin 0.3 - 1.2 mg/dL 1.5(H) - -  Alkaline Phos 38 - 126 U/L 57 - -  AST 15 - 41 U/L 26 - -  ALT 0 - 44 U/L 13 - -

## 2017-11-25 LAB — GLUCOSE, CAPILLARY
Glucose-Capillary: 71 mg/dL (ref 70–99)
Glucose-Capillary: 119 mg/dL — ABNORMAL HIGH (ref 70–99)

## 2017-11-25 MED ORDER — FLUDROCORTISONE ACETATE 0.1 MG PO TABS: 0.1000 mg | ORAL_TABLET | Freq: Every day | ORAL | 1 refills | Status: DC

## 2017-11-25 NOTE — Plan of Care (Signed)
Patient slept well through night.  Blood pressures ran high in the night but are working on best dosages of BP meds to control syncope.

## 2017-11-25 NOTE — Discharge Summary (Signed)
Physician Discharge Summary  Harry [name] KNL:[id number] DOB: 14-Oct-1945 DOA: 11/22/2017  PCP: [name], MD  Admit date: 11/22/2017 Discharge date: 11/25/2017  Admitted From: Home  Disposition:  Home   Recommendations for Outpatient Follow-up:  1. Follow up with PCP in 1 week 2. Please obtain BMP in 1 week 3. Please follow up BP on new fludrocortisone, as well as K    Home Health: Yes  Equipment/Devices: None  Discharge Condition: Fair  CODE STATUS: FULL Diet recommendation: High salt  Brief/Interim Summary: Harry [name] is a 72 y.o. M with HTN, orthostatic hypotension intolerant of midodrine, CKD III, PVD hx of left SFA stent, and hx of stroke who presents with passing out.  Patient was recently admitted for syncope, had negative MRI brain, carotid dopplers, Echocardiogram, but was significantly orthostatic on exam.  Given fluids, discharged with instructions to liberalize salt intake.  He was also given midodrine, but hadn't started yet when on the day of admission, he was sitting on the porch, got up and was walking into the house when he passed out and collapsed.  Family sat him up in a chair when his eyes rolled back, he became unresponsive again, had right arm stiffness.  No micturation, no clear post-ictal confusion, no tongue biting.  This episode was similar to previous in that he had stood up before passing out.  On arrival to ER, patient was substantially orthostatic.       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  Other considerations include Parkinsonism or Shy-drager.  Doubt that his syncope is cardiogenic, but have referred for outpatietn cardiac monitoring.  If he were to have more episodes despite hydration and appropriate compression hose, abdominal binder and fludrocortison treatment, referral to neurology for outpatient ambulatory EEG would be reasonable.  Hypertension BP elevated after midodrine.  Midodrine caused  urinary retention in the hospital again, so this was discontinued and replaced with fludrocortisone.  Close follow up with PCP.  CT of c-spine Incidental finding of bulging cervical discs.  Follow up with Neurosurgery, Dr. Vertell Limber, as needed.    Diabetes No change to regimen     Discharge Instructions  Discharge Instructions    Diet general   Complete by:  As directed    Discharge instructions   Complete by:  As directed    From Dr. Loleta Books: You were admitted for passing out. This passing out keeps happening because your blood pressure drops when you stand up.  (If your blood pressure drops, your brain doesn't get enough oxygen for a second, and so you get dizzy and pass out).  This is called orthostatic hypotension. This happens when the nerves that control your blood vessels get faulty.  If you can imagine, your body has to be able to relax the blood vessels in your brain when you lay flat, but tighten them when you stand up (to squirt blood to the brain). If the body can't relax and tighten these blood vessels properly, people get "orthostatic hypotension". In your case, this is likely because of diabetes affecting the nerves that control your blood vessels.        The treatment for orthostatic hypotension starts with: Generous salt in your diet Use compression socks Use a  Belly binder (the compression socks and belly binder help prevent blood from pooling in the legs or belly when you stand)  Finally, some people use medicines: You had trouble peeing when you took midodrine, so stop this medicine Start fludricortisone  0.1 mg once daily in the morning Go see your primary care doctor in the next week Call them today to get an appointment.  If you notice a lot of leg swelling, stop the fludricortisone and call your doctor Have your doctor check lab work in 1 week      Lastly, we will have you see a Cardiologist for heart monitoring to make sure that the passing out  spells arent' from abnormal heart rhythms (which would be more serious), and we are just being distracted by the blood pressure drops.  This is unlikely, but simple to rule out. They will contact you about wearing a "heart monitor" which you will wear for a period of time, then return to the heart doctor's office.   Increase activity slowly   Complete by:  As directed      Allergies as of 11/25/2017      Reactions   Lipitor [atorvastatin] Other (See Comments)   Myalgia   Statins Other (See Comments)   Myalgia (CAN tolerate Crestor, however)   Pravachol [pravastatin] Rash      Medication List    STOP taking these medications   midodrine 5 MG tablet Commonly known as:  PROAMATINE     TAKE these medications   acetaminophen 500 MG tablet Commonly known as:  TYLENOL Take 1,000 mg by mouth every 6 (six) hours as needed (fever, headaches, or pain).   ARTIFICIAL TEARS 1.4 % ophthalmic solution Generic drug:  polyvinyl alcohol Place 1 drop into both eyes 2 (two) times daily as needed for dry eyes.   aspirin 81 MG EC tablet Take 1 tablet (81 mg total) by mouth daily.   BASAGLAR KWIKPEN 100 UNIT/ML Sopn Inject 30 Units into the skin at bedtime.   clopidogrel 75 MG tablet Commonly known as:  PLAVIX Take 1 tablet (75 mg total) by mouth daily.   fludrocortisone 0.1 MG tablet Commonly known as:  FLORINEF Take 1 tablet (0.1 mg total) by mouth daily.   rosuvastatin 5 MG tablet Commonly known as:  CRESTOR Take 1 tablet (5 mg total) by mouth daily.   sertraline 25 MG tablet Commonly known as:  ZOLOFT TAKE ONE TABLET BY MOUTH DAILY      Follow-up Information    [facility] up.   Specialty:  Home Health Services Why:  HHPT services resumed Contact information: [address] [place] [address] [phone]          Allergies  Allergen Reactions  . Lipitor [Atorvastatin] Other (See Comments)    Myalgia   . Statins Other (See Comments)     Myalgia (CAN tolerate Crestor, however)  . Pravachol [Pravastatin] Rash    Consultations:  None   Procedures/Studies: Dg Chest 2 View  Result Date: 11/15/2017 CLINICAL DATA:  Recent syncopal episode EXAM: CHEST - 2 VIEW COMPARISON:  08/16/2016 FINDINGS: The heart size and mediastinal contours are within normal limits. Both lungs are clear. The visualized skeletal structures are unremarkable. Metallic foreign body is noted on the lateral projection which appears extrinsic to the patient. IMPRESSION: No active cardiopulmonary disease. Electronically Signed   By: Inez Catalina M.D.   On: 11/15/2017 13:50   Ct Head Wo Contrast  Result Date: 11/22/2017 CLINICAL DATA:  Syncope.  Patient fell.  Vomiting. EXAM: CT HEAD WITHOUT CONTRAST CT CERVICAL SPINE WITHOUT CONTRAST TECHNIQUE: Multidetector CT imaging of the head and cervical spine was performed following the standard protocol without intravenous contrast. Multiplanar CT image reconstructions  of the cervical spine were also generated. COMPARISON:  Head CT 09/19/2016 FINDINGS: CT HEAD FINDINGS Brain: There is no evidence for acute hemorrhage, hydrocephalus, mass lesion, or abnormal extra-axial fluid collection. No definite CT evidence for acute infarction. Diffuse loss of parenchymal volume is consistent with atrophy. Old left inferior left cerebellar infarct again noted. Left PCA territory nonacute infarct is stable. Lacunar infarcts are evident in the basal ganglia bilaterally. Hypo attenuation in the pons compatible with chronic infarct as characterized on previous MRI. Vascular: No hyperdense vessel or unexpected calcification. Skull: No evidence for fracture. No worrisome lytic or sclerotic lesion. Sinuses/Orbits: The visualized paranasal sinuses and mastoid air cells are clear. Visualized portions of the globes and intraorbital fat are unremarkable. Other: None. CT CERVICAL SPINE FINDINGS Alignment: Mild straightening of normal cervical lordosis.  Skull base and vertebrae: No acute fracture. No primary bone lesion or focal pathologic process. Soft tissues and spinal canal: Left paracentral broad-based disc protrusion at C4-5 (axial image 58/series 9) generates substantial mass-effect in the central canal, narrowing the AP diameter of the canal at this level to 4-5 mm. Disc levels:  Relatively well preserved throughout. Upper chest: Unremarkable. Other: None. IMPRESSION: 1. No acute intracranial abnormality. Atrophy with chronic small vessel white matter ischemic disease. 2. Old left cerebellar and left PCA territory infarct. Previous MRI documented pons infarct. 3. No evidence for cervical spine fracture. 4. Large left paracentral broad-based disc protrusion at C4-5 generates substantial mass-effect on the central canal narrowing AP diameter at this level to 4-5 mm. MRI of the cervical spine may prove helpful to further evaluate. Electronically Signed   By: Misty Stanley M.D.   On: 11/22/2017 22:01   Ct Cervical Spine Wo Contrast  Result Date: 11/22/2017 CLINICAL DATA:  Syncope.  Patient fell.  Vomiting. EXAM: CT HEAD WITHOUT CONTRAST CT CERVICAL SPINE WITHOUT CONTRAST TECHNIQUE: Multidetector CT imaging of the head and cervical spine was performed following the standard protocol without intravenous contrast. Multiplanar CT image reconstructions of the cervical spine were also generated. COMPARISON:  Head CT 09/19/2016 FINDINGS: CT HEAD FINDINGS Brain: There is no evidence for acute hemorrhage, hydrocephalus, mass lesion, or abnormal extra-axial fluid collection. No definite CT evidence for acute infarction. Diffuse loss of parenchymal volume is consistent with atrophy. Old left inferior left cerebellar infarct again noted. Left PCA territory nonacute infarct is stable. Lacunar infarcts are evident in the basal ganglia bilaterally. Hypo attenuation in the pons compatible with chronic infarct as characterized on previous MRI. Vascular: No hyperdense vessel  or unexpected calcification. Skull: No evidence for fracture. No worrisome lytic or sclerotic lesion. Sinuses/Orbits: The visualized paranasal sinuses and mastoid air cells are clear. Visualized portions of the globes and intraorbital fat are unremarkable. Other: None. CT CERVICAL SPINE FINDINGS Alignment: Mild straightening of normal cervical lordosis. Skull base and vertebrae: No acute fracture. No primary bone lesion or focal pathologic process. Soft tissues and spinal canal: Left paracentral broad-based disc protrusion at C4-5 (axial image 58/series 9) generates substantial mass-effect in the central canal, narrowing the AP diameter of the canal at this level to 4-5 mm. Disc levels:  Relatively well preserved throughout. Upper chest: Unremarkable. Other: None. IMPRESSION: 1. No acute intracranial abnormality. Atrophy with chronic small vessel white matter ischemic disease. 2. Old left cerebellar and left PCA territory infarct. Previous MRI documented pons infarct. 3. No evidence for cervical spine fracture. 4. Large left paracentral broad-based disc protrusion at C4-5 generates substantial mass-effect on the central canal narrowing AP diameter at this  level to 4-5 mm. MRI of the cervical spine may prove helpful to further evaluate. Electronically Signed   By: Misty Stanley M.D.   On: 11/22/2017 22:01   Mr Brain Wo Contrast  Result Date: 11/16/2017 CLINICAL DATA:  Recurrent syncope. History of hypertension, hyperlipidemia, diabetes. EXAM: MRI HEAD WITHOUT CONTRAST TECHNIQUE: Multiplanar, multiecho pulse sequences of the brain and surrounding structures were obtained without intravenous contrast. COMPARISON:  CT HEAD September 19, 2016; MRI of the head May 25, 2016 FINDINGS: INTRACRANIAL CONTENTS: No reduced diffusion to suggest acute ischemia. Old LEFT greater than RIGHT cerebellar infarcts. Bilateral mesial occipital lobe a encephalomalacia. Patchy to confluent pontine T2 hyperintensities, old medullary  infarcts. Old bilateral thalamus infarcts. Prominent basal ganglia perivascular spaces associated chronic small vessel ischemic changes. Patchy supratentorial white matter FLAIR T2 hyperintensities. No midline shift, mass effect or masses. Scattered chronic microhemorrhages. Moderate parenchymal brain volume loss. No hydrocephalus. VASCULAR: Normal major intracranial vascular flow voids present at skull base. SKULL AND UPPER CERVICAL SPINE: No abnormal sellar expansion. Heterogeneous bone marrow signal. Craniocervical junction maintained. SINUSES/ORBITS: Trace paranasal sinus mucosal thickening. Minimal mastoid effusions.Status post bilateral ocular lens implants. OTHER: None. IMPRESSION: 1. No acute intracranial process. 2. Multiple old posterior circulation infarcts. 3. Moderate chronic small vessel ischemic changes. 4. Moderate chronic small-vessel parenchymal brain volume loss. Electronically Signed   By: Elon Alas M.D.   On: 11/16/2017 00:54   Mr Cervical Spine Wo Contrast  Result Date: 11/23/2017 CLINICAL DATA:  Syncope. C4-5 disc protrusion demonstrated on CT scan dated 11/22/2017 with spinal cord compression. EXAM: MRI CERVICAL SPINE WITHOUT CONTRAST TECHNIQUE: Multiplanar, multisequence MR imaging of the cervical spine was performed. No intravenous contrast was administered. COMPARISON:  CT scan dated 11/22/2017 FINDINGS: Alignment: Straightening of the normal cervical lordosis. Vertebrae: No fracture, evidence of discitis, or bone lesion. Cord: There are small focal areas of myelopathy in the right side of the spinal cord at C4 and C5. The left side of the spinal cord is severely compressed at C4-5. Posterior Fossa, vertebral arteries, paraspinal tissues: The patient has multiple old posterior circulation infarcts with chronic abnormal signal from the pons. Paraspinal soft tissues appear normal. Disc levels: C2-3: Normal. C3-4: Central soft disc protrusion which compresses the ventral aspect of  the spinal cord with a small focal area of myelopathy of the anterior aspect of the cord just to the right of midline seen on image 16 of series 8. No significant foraminal stenosis. No significant facet arthritis. C4-5: Prominent soft disc protrusion and extrusion central and to the left extending slightly inferiorly behind the body of C5 compressing the left side of the spinal cord severely. Focal myelopathy in the right side of the spinal cord, best seen on images 21 and 22 of series 8. No foraminal stenosis. C5-6: No significant abnormality. C6-7: Tiny broad-based disc bulge with no neural impingement. IMPRESSION: 1. Soft disc protrusion and extrusion central and to the left at C4-5 with spinal cord compression and myelopathy. Encroachment upon both lateral recesses at C4-5. 2. Central disc protrusion at C3-4 compressing the spinal cord with focal myelopathy to the right of midline. 3. Extensive old posterior circulation infarcts. Electronically Signed   By: Lorriane Shire M.D.   On: 11/23/2017 11:03       Subjective: Feels well.  No seizures overnight.  No passing out.  Has been mostly in bed.  No chest pain, palpitations.  No confusion.  Telemetry reviewed and no events.  Discharge Exam: Vitals:   11/25/17 3785  11/25/17 1002  BP: (!) 171/81 (!) 174/82  Pulse:  82  Resp: (!) 26 15  Temp: 97.9 F (36.6 C) 97.8 F (36.6 C)  SpO2:  97%   Vitals:   11/24/17 1928 11/24/17 2200 11/25/17 0535 11/25/17 1002  BP: (!) 191/92 (!) 180/90 (!) 171/81 (!) 174/82  Pulse: 86   82  Resp: 13  (!) 26 15  Temp: 97.8 F (36.6 C)  97.9 F (36.6 C) 97.8 F (36.6 C)  TempSrc: Oral  Oral Oral  SpO2: 97%   97%  Weight:      Height:        General: Pt is alert, awake, not in acute distress, lying in bed, interactgive Cardiovascular: RRR, S1/S2 +, no rubs, no gallops Respiratory: CTA bilaterally, no wheezing, no rhonchi Abdominal: Soft, NT, ND, bowel sounds + Extremities: no edema, no  cyanosis    The results of significant diagnostics from this hospitalization (including imaging, microbiology, ancillary and laboratory) are listed below for reference.     Microbiology: No results found for this or any previous visit (from the past 240 hour(s)).   Labs: BNP (last 3 results) No results for input(s): BNP in the last 8760 hours. Basic Metabolic Panel: Recent Labs  Lab 11/22/17 1820 11/22/17 2022  NA 143 143  K 3.5 4.3  CL 109 110  CO2 23 23  GLUCOSE 92 93  BUN 21 20  CREATININE 1.73* 1.68*  CALCIUM 8.6* 8.6*   Liver Function Tests: Recent Labs  Lab 11/22/17 2022  AST 26  ALT 13  ALKPHOS 57  BILITOT 1.5*  PROT 7.0  ALBUMIN 3.8   No results for input(s): LIPASE, AMYLASE in the last 168 hours. No results for input(s): AMMONIA in the last 168 hours. CBC: Recent Labs  Lab 11/22/17 1820  WBC 3.7*  NEUTROABS 2.0  HGB 11.0*  HCT 34.6*  MCV 94.8  PLT 234   Cardiac Enzymes: No results for input(s): CKTOTAL, CKMB, CKMBINDEX, TROPONINI in the last 168 hours. BNP: Invalid input(s): POCBNP CBG: Recent Labs  Lab 11/24/17 1200 11/24/17 1640 11/24/17 2138 11/25/17 0615 11/25/17 1148  GLUCAP 119* 159* 162* 71 119*   D-Dimer No results for input(s): DDIMER in the last 72 hours. Hgb A1c No results for input(s): HGBA1C in the last 72 hours. Lipid Profile No results for input(s): CHOL, HDL, LDLCALC, TRIG, CHOLHDL, LDLDIRECT in the last 72 hours. Thyroid function studies No results for input(s): TSH, T4TOTAL, T3FREE, THYROIDAB in the last 72 hours.  Invalid input(s): FREET3 Anemia work up No results for input(s): VITAMINB12, FOLATE, FERRITIN, TIBC, IRON, RETICCTPCT in the last 72 hours. Urinalysis    Component Value Date/Time   COLORURINE YELLOW 11/22/2017 2241   APPEARANCEUR HAZY (A) 11/22/2017 2241   LABSPEC 1.016 11/22/2017 2241   PHURINE 5.0 11/22/2017 2241   GLUCOSEU 50 (A) 11/22/2017 2241   HGBUR NEGATIVE 11/22/2017 2241    BILIRUBINUR NEGATIVE 11/22/2017 2241   KETONESUR NEGATIVE 11/22/2017 2241   PROTEINUR NEGATIVE 11/22/2017 2241   UROBILINOGEN 0.2 10/09/2014 2112   NITRITE NEGATIVE 11/22/2017 2241   LEUKOCYTESUR NEGATIVE 11/22/2017 2241   Sepsis Labs Invalid input(s): PROCALCITONIN,  WBC,  LACTICIDVEN Microbiology No results found for this or any previous visit (from the past 240 hour(s)).   Time coordinating discharge: 45 minutes       SIGNED:   Edwin Dada, MD  [facility] 11/25/2017, 7:44 PM

## 2017-11-25 NOTE — Care Management Note (Signed)
Case Management Note Previous CM note completed by Carles Collet, RN 11/24/2017, 9:31 AM   Patient Details  Name: Harry [name] MRN: [id number] Date of Birth: 03-20-1945  Subjective/Objective:                 fall   Action/Plan:  Spoke with patient. Has cane and walker at home, is active w [facility] for PT. Will need resumption orders if changed to inpatient. Patient lives with daughter who he states is a Marine scientist at [employer]   Expected Discharge Date:  11/25/17               Expected Discharge Plan:  [place]  In-House Referral:  NA  Discharge planning Services  CM Consult  Post Acute Care Choice:  Home Health, Resumption of Svcs/PTA Provider Choice offered to:  Patient, Spouse  DME Arranged:  N/A DME Agency:     HH Arranged:  PT [place] Agency:  [facility]  Status of Service:  Completed, signed off  If discussed at [employer], dates discussed:    Discharge Disposition: home/home health   Additional Comments:  11/25/17- [place] RN, CM- pt for transition back home today. HH orders have been placed to resume services for HHPT/OT- have spoken with Tiffany with [facility] regarding resumption of [place] services and adding HHOT.   Dawayne Patricia, RN 11/25/2017, 10:45 AM [address]- RN Care Coordinator  [phone]

## 2017-11-25 NOTE — Progress Notes (Signed)
Pt IV d/c with no complciations, end intact. Tele d/c CCMD notified. AVS discharge instructions given to pt and family. Pt voiced understanding with no further questions, all questions answered. Pt brought down with family and volunteer via wheelchair.  Amanda Cockayne, RN

## 2017-11-25 NOTE — Progress Notes (Signed)
Physical Therapy Treatment Patient Details Name: Harry [name] MRN: [id number] DOB: 1945-06-26 Today's Date: 11/25/2017    History of Present Illness Pt adm with recurrent syncope. Orthostatic in ED. PMH of kidney disease, hypertension, peripheral vascular disease, stroke, diabetes type 2 and dementia.    PT Comments    Pt able to amb in hallway with min guard assist. Pt denied feeling funny, lightheaded or dizzy.  Pt with knee high TED hose and abdominal binder on. Vitals as follows:  Orthostatic BPs  Supine 187/92 HR 80  Sitting 127/67 HR 88  Sitting after 3 min 146/81 HR 87  Standing 81/63 HR 96  Standing after 3 min 94/65 HR 98  Sitting after amb 125/68 HR 90      Follow Up Recommendations  Home health PT;Supervision/Assistance - 24 hour     Equipment Recommendations  None recommended by PT    Recommendations for Other Services       Precautions / Restrictions Precautions Precautions: Fall Precaution Comments: orthostatic Restrictions Weight Bearing Restrictions: No    Mobility  Bed Mobility Overal bed mobility: Needs Assistance Bed Mobility: Supine to Sit     Supine to sit: Min guard;HOB elevated     General bed mobility comments: Incr time  Transfers Overall transfer level: Needs assistance Equipment used: 4-wheeled walker Transfers: Sit to/from Stand Sit to Stand: Min assist         General transfer comment: Assist for balance and to bring hips up  Ambulation/Gait Ambulation/Gait assistance: Min guard Gait Distance (Feet): 170 Feet Assistive device: 4-wheeled walker Gait Pattern/deviations: Step-through pattern;Trunk flexed;Decreased stride length Gait velocity: decr Gait velocity interpretation: <1.31 ft/sec, indicative of household ambulator General Gait Details: Assist for balance. 1 verbal cue to stay closer to walker   Stairs             Wheelchair Mobility    Modified Rankin (Stroke Patients Only)       Balance  Overall balance assessment: Needs assistance Sitting-balance support: No upper extremity supported;Feet supported Sitting balance-Leahy Scale: Fair     Standing balance support: Bilateral upper extremity supported Standing balance-Leahy Scale: Poor Standing balance comment: walker and min guard for static standing                            Cognition Arousal/Alertness: Awake/alert Behavior During Therapy: Flat affect Overall Cognitive Status: History of cognitive impairments - at baseline                                        Exercises      General Comments        Pertinent Vitals/Pain Pain Assessment: No/denies pain    Home Living                      Prior Function            PT Goals (current goals can now be found in the care plan section) Progress towards PT goals: Progressing toward goals    Frequency    Min 3X/week      PT Plan Current plan remains appropriate    Co-evaluation              AM-PAC PT "6 Clicks" Daily Activity  Outcome Measure  Difficulty turning over in bed (including adjusting bedclothes, sheets and blankets)?: A Little  Difficulty moving from lying on back to sitting on the side of the bed? : A Little Difficulty sitting down on and standing up from a chair with arms (e.g., wheelchair, bedside commode, etc,.)?: Unable Help needed moving to and from a bed to chair (including a wheelchair)?: A Little Help needed walking in hospital room?: A Little Help needed climbing 3-5 steps with a railing? : Total 6 Click Score: 14    End of Session Equipment Utilized During Treatment: Gait belt Activity Tolerance: Patient tolerated treatment well Patient left: with call bell/phone within reach;with family/visitor present;in chair;with chair alarm set Nurse Communication: Mobility status;Other (comment)(orthostatic vitals) PT Visit Diagnosis: Unsteadiness on feet (R26.81);Other abnormalities of gait and  mobility (R26.89);History of falling (Z91.81)     Time: 7262-0355 PT Time Calculation (min) (ACUTE ONLY): 27 min  Charges:  $Gait Training: 23-37 mins                     [place] Pager [phone] Office [place] 11/25/2017, 3:20 PM

## 2017-11-26 ENCOUNTER — Other Ambulatory Visit: Payer: Self-pay | Admitting: Physician Assistant

## 2017-11-26 DIAGNOSIS — R55 Syncope and collapse: Secondary | ICD-10-CM

## 2017-11-27 DIAGNOSIS — I69354 Hemiplegia and hemiparesis following cerebral infarction affecting left non-dominant side: Secondary | ICD-10-CM | POA: Diagnosis not present

## 2017-11-27 DIAGNOSIS — G3183 Dementia with Lewy bodies: Secondary | ICD-10-CM | POA: Diagnosis not present

## 2017-11-27 DIAGNOSIS — F0280 Dementia in other diseases classified elsewhere without behavioral disturbance: Secondary | ICD-10-CM | POA: Diagnosis not present

## 2017-11-27 DIAGNOSIS — I951 Orthostatic hypotension: Secondary | ICD-10-CM | POA: Diagnosis not present

## 2017-11-27 DIAGNOSIS — I129 Hypertensive chronic kidney disease with stage 1 through stage 4 chronic kidney disease, or unspecified chronic kidney disease: Secondary | ICD-10-CM | POA: Diagnosis not present

## 2017-11-27 DIAGNOSIS — M48061 Spinal stenosis, lumbar region without neurogenic claudication: Secondary | ICD-10-CM | POA: Diagnosis not present

## 2017-12-04 ENCOUNTER — Ambulatory Visit (INDEPENDENT_AMBULATORY_CARE_PROVIDER_SITE_OTHER): Payer: Medicare Other

## 2017-12-04 DIAGNOSIS — R55 Syncope and collapse: Secondary | ICD-10-CM | POA: Diagnosis not present

## 2017-12-06 DIAGNOSIS — M48061 Spinal stenosis, lumbar region without neurogenic claudication: Secondary | ICD-10-CM | POA: Diagnosis not present

## 2017-12-06 DIAGNOSIS — G3183 Dementia with Lewy bodies: Secondary | ICD-10-CM | POA: Diagnosis not present

## 2017-12-06 DIAGNOSIS — I69354 Hemiplegia and hemiparesis following cerebral infarction affecting left non-dominant side: Secondary | ICD-10-CM | POA: Diagnosis not present

## 2017-12-06 DIAGNOSIS — I951 Orthostatic hypotension: Secondary | ICD-10-CM | POA: Diagnosis not present

## 2017-12-06 DIAGNOSIS — F0280 Dementia in other diseases classified elsewhere without behavioral disturbance: Secondary | ICD-10-CM | POA: Diagnosis not present

## 2017-12-06 DIAGNOSIS — I129 Hypertensive chronic kidney disease with stage 1 through stage 4 chronic kidney disease, or unspecified chronic kidney disease: Secondary | ICD-10-CM | POA: Diagnosis not present

## 2017-12-10 DIAGNOSIS — H26491 Other secondary cataract, right eye: Secondary | ICD-10-CM | POA: Diagnosis not present

## 2017-12-10 DIAGNOSIS — H40011 Open angle with borderline findings, low risk, right eye: Secondary | ICD-10-CM | POA: Diagnosis not present

## 2017-12-10 DIAGNOSIS — E113591 Type 2 diabetes mellitus with proliferative diabetic retinopathy without macular edema, right eye: Secondary | ICD-10-CM | POA: Diagnosis not present

## 2017-12-12 DIAGNOSIS — I69354 Hemiplegia and hemiparesis following cerebral infarction affecting left non-dominant side: Secondary | ICD-10-CM | POA: Diagnosis not present

## 2017-12-12 DIAGNOSIS — I129 Hypertensive chronic kidney disease with stage 1 through stage 4 chronic kidney disease, or unspecified chronic kidney disease: Secondary | ICD-10-CM | POA: Diagnosis not present

## 2017-12-12 DIAGNOSIS — F0280 Dementia in other diseases classified elsewhere without behavioral disturbance: Secondary | ICD-10-CM | POA: Diagnosis not present

## 2017-12-12 DIAGNOSIS — G3183 Dementia with Lewy bodies: Secondary | ICD-10-CM | POA: Diagnosis not present

## 2017-12-12 DIAGNOSIS — M48061 Spinal stenosis, lumbar region without neurogenic claudication: Secondary | ICD-10-CM | POA: Diagnosis not present

## 2017-12-12 DIAGNOSIS — I951 Orthostatic hypotension: Secondary | ICD-10-CM | POA: Diagnosis not present

## 2017-12-13 DIAGNOSIS — H26491 Other secondary cataract, right eye: Secondary | ICD-10-CM | POA: Diagnosis not present

## 2017-12-17 DIAGNOSIS — I129 Hypertensive chronic kidney disease with stage 1 through stage 4 chronic kidney disease, or unspecified chronic kidney disease: Secondary | ICD-10-CM | POA: Diagnosis not present

## 2017-12-17 DIAGNOSIS — M48061 Spinal stenosis, lumbar region without neurogenic claudication: Secondary | ICD-10-CM | POA: Diagnosis not present

## 2017-12-17 DIAGNOSIS — I69354 Hemiplegia and hemiparesis following cerebral infarction affecting left non-dominant side: Secondary | ICD-10-CM | POA: Diagnosis not present

## 2017-12-17 DIAGNOSIS — F0280 Dementia in other diseases classified elsewhere without behavioral disturbance: Secondary | ICD-10-CM | POA: Diagnosis not present

## 2017-12-17 DIAGNOSIS — G3183 Dementia with Lewy bodies: Secondary | ICD-10-CM | POA: Diagnosis not present

## 2017-12-17 DIAGNOSIS — I951 Orthostatic hypotension: Secondary | ICD-10-CM | POA: Diagnosis not present

## 2017-12-18 DIAGNOSIS — I10 Essential (primary) hypertension: Secondary | ICD-10-CM | POA: Diagnosis not present

## 2017-12-18 DIAGNOSIS — E1143 Type 2 diabetes mellitus with diabetic autonomic (poly)neuropathy: Secondary | ICD-10-CM | POA: Diagnosis not present

## 2017-12-19 DIAGNOSIS — I129 Hypertensive chronic kidney disease with stage 1 through stage 4 chronic kidney disease, or unspecified chronic kidney disease: Secondary | ICD-10-CM | POA: Diagnosis not present

## 2017-12-19 DIAGNOSIS — F0280 Dementia in other diseases classified elsewhere without behavioral disturbance: Secondary | ICD-10-CM | POA: Diagnosis not present

## 2017-12-19 DIAGNOSIS — I951 Orthostatic hypotension: Secondary | ICD-10-CM | POA: Diagnosis not present

## 2017-12-19 DIAGNOSIS — I69354 Hemiplegia and hemiparesis following cerebral infarction affecting left non-dominant side: Secondary | ICD-10-CM | POA: Diagnosis not present

## 2017-12-19 DIAGNOSIS — G3183 Dementia with Lewy bodies: Secondary | ICD-10-CM | POA: Diagnosis not present

## 2017-12-19 DIAGNOSIS — M48061 Spinal stenosis, lumbar region without neurogenic claudication: Secondary | ICD-10-CM | POA: Diagnosis not present

## 2017-12-26 DIAGNOSIS — M48061 Spinal stenosis, lumbar region without neurogenic claudication: Secondary | ICD-10-CM | POA: Diagnosis not present

## 2017-12-26 DIAGNOSIS — F0280 Dementia in other diseases classified elsewhere without behavioral disturbance: Secondary | ICD-10-CM | POA: Diagnosis not present

## 2017-12-26 DIAGNOSIS — I129 Hypertensive chronic kidney disease with stage 1 through stage 4 chronic kidney disease, or unspecified chronic kidney disease: Secondary | ICD-10-CM | POA: Diagnosis not present

## 2017-12-26 DIAGNOSIS — I69354 Hemiplegia and hemiparesis following cerebral infarction affecting left non-dominant side: Secondary | ICD-10-CM | POA: Diagnosis not present

## 2017-12-26 DIAGNOSIS — G3183 Dementia with Lewy bodies: Secondary | ICD-10-CM | POA: Diagnosis not present

## 2017-12-26 DIAGNOSIS — I951 Orthostatic hypotension: Secondary | ICD-10-CM | POA: Diagnosis not present

## 2017-12-27 DIAGNOSIS — G3183 Dementia with Lewy bodies: Secondary | ICD-10-CM | POA: Diagnosis not present

## 2017-12-27 DIAGNOSIS — M48061 Spinal stenosis, lumbar region without neurogenic claudication: Secondary | ICD-10-CM | POA: Diagnosis not present

## 2017-12-27 DIAGNOSIS — F0280 Dementia in other diseases classified elsewhere without behavioral disturbance: Secondary | ICD-10-CM | POA: Diagnosis not present

## 2017-12-27 DIAGNOSIS — I951 Orthostatic hypotension: Secondary | ICD-10-CM | POA: Diagnosis not present

## 2017-12-27 DIAGNOSIS — I69354 Hemiplegia and hemiparesis following cerebral infarction affecting left non-dominant side: Secondary | ICD-10-CM | POA: Diagnosis not present

## 2017-12-27 DIAGNOSIS — I129 Hypertensive chronic kidney disease with stage 1 through stage 4 chronic kidney disease, or unspecified chronic kidney disease: Secondary | ICD-10-CM | POA: Diagnosis not present

## 2017-12-31 DIAGNOSIS — M48061 Spinal stenosis, lumbar region without neurogenic claudication: Secondary | ICD-10-CM | POA: Diagnosis not present

## 2017-12-31 DIAGNOSIS — F0280 Dementia in other diseases classified elsewhere without behavioral disturbance: Secondary | ICD-10-CM | POA: Diagnosis not present

## 2017-12-31 DIAGNOSIS — I129 Hypertensive chronic kidney disease with stage 1 through stage 4 chronic kidney disease, or unspecified chronic kidney disease: Secondary | ICD-10-CM | POA: Diagnosis not present

## 2017-12-31 DIAGNOSIS — G3183 Dementia with Lewy bodies: Secondary | ICD-10-CM | POA: Diagnosis not present

## 2017-12-31 DIAGNOSIS — I69354 Hemiplegia and hemiparesis following cerebral infarction affecting left non-dominant side: Secondary | ICD-10-CM | POA: Diagnosis not present

## 2017-12-31 DIAGNOSIS — I951 Orthostatic hypotension: Secondary | ICD-10-CM | POA: Diagnosis not present

## 2018-01-02 DIAGNOSIS — F0280 Dementia in other diseases classified elsewhere without behavioral disturbance: Secondary | ICD-10-CM | POA: Diagnosis not present

## 2018-01-02 DIAGNOSIS — I69354 Hemiplegia and hemiparesis following cerebral infarction affecting left non-dominant side: Secondary | ICD-10-CM | POA: Diagnosis not present

## 2018-01-02 DIAGNOSIS — G3183 Dementia with Lewy bodies: Secondary | ICD-10-CM | POA: Diagnosis not present

## 2018-01-02 DIAGNOSIS — I951 Orthostatic hypotension: Secondary | ICD-10-CM | POA: Diagnosis not present

## 2018-01-02 DIAGNOSIS — M48061 Spinal stenosis, lumbar region without neurogenic claudication: Secondary | ICD-10-CM | POA: Diagnosis not present

## 2018-01-02 DIAGNOSIS — I129 Hypertensive chronic kidney disease with stage 1 through stage 4 chronic kidney disease, or unspecified chronic kidney disease: Secondary | ICD-10-CM | POA: Diagnosis not present

## 2018-01-09 DIAGNOSIS — I129 Hypertensive chronic kidney disease with stage 1 through stage 4 chronic kidney disease, or unspecified chronic kidney disease: Secondary | ICD-10-CM | POA: Diagnosis not present

## 2018-01-09 DIAGNOSIS — M48061 Spinal stenosis, lumbar region without neurogenic claudication: Secondary | ICD-10-CM | POA: Diagnosis not present

## 2018-01-09 DIAGNOSIS — G3183 Dementia with Lewy bodies: Secondary | ICD-10-CM | POA: Diagnosis not present

## 2018-01-09 DIAGNOSIS — F0280 Dementia in other diseases classified elsewhere without behavioral disturbance: Secondary | ICD-10-CM | POA: Diagnosis not present

## 2018-01-09 DIAGNOSIS — I951 Orthostatic hypotension: Secondary | ICD-10-CM | POA: Diagnosis not present

## 2018-01-09 DIAGNOSIS — I69354 Hemiplegia and hemiparesis following cerebral infarction affecting left non-dominant side: Secondary | ICD-10-CM | POA: Diagnosis not present

## 2018-01-10 DIAGNOSIS — I129 Hypertensive chronic kidney disease with stage 1 through stage 4 chronic kidney disease, or unspecified chronic kidney disease: Secondary | ICD-10-CM | POA: Diagnosis not present

## 2018-01-10 DIAGNOSIS — M48061 Spinal stenosis, lumbar region without neurogenic claudication: Secondary | ICD-10-CM | POA: Diagnosis not present

## 2018-01-10 DIAGNOSIS — I951 Orthostatic hypotension: Secondary | ICD-10-CM | POA: Diagnosis not present

## 2018-01-10 DIAGNOSIS — F0280 Dementia in other diseases classified elsewhere without behavioral disturbance: Secondary | ICD-10-CM | POA: Diagnosis not present

## 2018-01-10 DIAGNOSIS — G3183 Dementia with Lewy bodies: Secondary | ICD-10-CM | POA: Diagnosis not present

## 2018-01-10 DIAGNOSIS — I69354 Hemiplegia and hemiparesis following cerebral infarction affecting left non-dominant side: Secondary | ICD-10-CM | POA: Diagnosis not present

## 2018-01-13 DIAGNOSIS — G3183 Dementia with Lewy bodies: Secondary | ICD-10-CM | POA: Diagnosis not present

## 2018-01-13 DIAGNOSIS — I129 Hypertensive chronic kidney disease with stage 1 through stage 4 chronic kidney disease, or unspecified chronic kidney disease: Secondary | ICD-10-CM | POA: Diagnosis not present

## 2018-01-13 DIAGNOSIS — F0280 Dementia in other diseases classified elsewhere without behavioral disturbance: Secondary | ICD-10-CM | POA: Diagnosis not present

## 2018-01-13 DIAGNOSIS — I69354 Hemiplegia and hemiparesis following cerebral infarction affecting left non-dominant side: Secondary | ICD-10-CM | POA: Diagnosis not present

## 2018-01-13 DIAGNOSIS — M48061 Spinal stenosis, lumbar region without neurogenic claudication: Secondary | ICD-10-CM | POA: Diagnosis not present

## 2018-01-13 DIAGNOSIS — I951 Orthostatic hypotension: Secondary | ICD-10-CM | POA: Diagnosis not present

## 2018-01-17 DIAGNOSIS — I129 Hypertensive chronic kidney disease with stage 1 through stage 4 chronic kidney disease, or unspecified chronic kidney disease: Secondary | ICD-10-CM | POA: Diagnosis not present

## 2018-01-17 DIAGNOSIS — I69354 Hemiplegia and hemiparesis following cerebral infarction affecting left non-dominant side: Secondary | ICD-10-CM | POA: Diagnosis not present

## 2018-01-17 DIAGNOSIS — F0280 Dementia in other diseases classified elsewhere without behavioral disturbance: Secondary | ICD-10-CM | POA: Diagnosis not present

## 2018-01-17 DIAGNOSIS — M48061 Spinal stenosis, lumbar region without neurogenic claudication: Secondary | ICD-10-CM | POA: Diagnosis not present

## 2018-01-17 DIAGNOSIS — I951 Orthostatic hypotension: Secondary | ICD-10-CM | POA: Diagnosis not present

## 2018-01-17 DIAGNOSIS — G3183 Dementia with Lewy bodies: Secondary | ICD-10-CM | POA: Diagnosis not present

## 2018-01-29 ENCOUNTER — Encounter (HOSPITAL_COMMUNITY): Payer: Self-pay

## 2018-01-29 ENCOUNTER — Emergency Department (HOSPITAL_COMMUNITY)
Admission: EM | Admit: 2018-01-29 | Discharge: 2018-01-29 | Disposition: A | Discharge status: Home / Self Care | Payer: Medicare Other | Attending: Emergency Medicine | Admitting: Emergency Medicine

## 2018-01-29 DIAGNOSIS — N183 Chronic kidney disease, stage 3 (moderate): Secondary | ICD-10-CM | POA: Diagnosis not present

## 2018-01-29 DIAGNOSIS — Z7901 Long term (current) use of anticoagulants: Secondary | ICD-10-CM | POA: Insufficient documentation

## 2018-01-29 DIAGNOSIS — E860 Dehydration: Secondary | ICD-10-CM | POA: Insufficient documentation

## 2018-01-29 DIAGNOSIS — R531 Weakness: Secondary | ICD-10-CM | POA: Diagnosis not present

## 2018-01-29 DIAGNOSIS — F17210 Nicotine dependence, cigarettes, uncomplicated: Secondary | ICD-10-CM | POA: Insufficient documentation

## 2018-01-29 DIAGNOSIS — E876 Hypokalemia: Secondary | ICD-10-CM | POA: Insufficient documentation

## 2018-01-29 DIAGNOSIS — Z79899 Other long term (current) drug therapy: Secondary | ICD-10-CM | POA: Diagnosis not present

## 2018-01-29 DIAGNOSIS — Z794 Long term (current) use of insulin: Secondary | ICD-10-CM | POA: Diagnosis not present

## 2018-01-29 DIAGNOSIS — N390 Urinary tract infection, site not specified: Secondary | ICD-10-CM | POA: Diagnosis not present

## 2018-01-29 DIAGNOSIS — I129 Hypertensive chronic kidney disease with stage 1 through stage 4 chronic kidney disease, or unspecified chronic kidney disease: Secondary | ICD-10-CM | POA: Diagnosis not present

## 2018-01-29 DIAGNOSIS — E1122 Type 2 diabetes mellitus with diabetic chronic kidney disease: Secondary | ICD-10-CM | POA: Diagnosis not present

## 2018-01-29 LAB — CBC
HCT: 35.7 % — ABNORMAL LOW (ref 39.0–52.0)
Hemoglobin: 11.1 g/dL — ABNORMAL LOW (ref 13.0–17.0)
MCH: 28.8 pg (ref 26.0–34.0)
MCHC: 31.1 g/dL (ref 30.0–36.0)
MCV: 92.5 fL (ref 80.0–100.0)
Platelets: 258 10*3/uL (ref 150–400)
RBC: 3.86 MIL/uL — ABNORMAL LOW (ref 4.22–5.81)
RDW: 13.9 % (ref 11.5–15.5)
WBC: 4.5 10*3/uL (ref 4.0–10.5)
nRBC: 0 % (ref 0.0–0.2)

## 2018-01-29 LAB — BASIC METABOLIC PANEL
Anion gap: 11 (ref 5–15)
BUN: 20 mg/dL (ref 8–23)
CO2: 26 mmol/L (ref 22–32)
Calcium: 8.7 mg/dL — ABNORMAL LOW (ref 8.9–10.3)
Chloride: 101 mmol/L (ref 98–111)
Creatinine, Ser: 2 mg/dL — ABNORMAL HIGH (ref 0.61–1.24)
GFR calc Af Amer: 38 mL/min — ABNORMAL LOW (ref >60)
GFR calc non Af Amer: 33 mL/min — ABNORMAL LOW (ref >60)
Glucose, Bld: 304 mg/dL — ABNORMAL HIGH (ref 70–99)
Potassium: 2.9 mmol/L — ABNORMAL LOW (ref 3.5–5.1)
Sodium: 138 mmol/L (ref 135–145)

## 2018-01-29 LAB — URINALYSIS, ROUTINE W REFLEX MICROSCOPIC
Bilirubin Urine: NEGATIVE
Glucose, UA: >=500 mg/dL — AB
Ketones, ur: NEGATIVE mg/dL
Nitrite: NEGATIVE
Protein, ur: 30 mg/dL — AB
Specific Gravity, Urine: 1.017 (ref 1.005–1.030)
pH: 6 (ref 5.0–8.0)

## 2018-01-29 MED ORDER — POTASSIUM CHLORIDE 10 MEQ/100ML IV SOLN
10.0000 meq | INTRAVENOUS | Status: DC
  Administered 2018-01-29: 10 meq via INTRAVENOUS
  Filled 2018-01-29: qty 100

## 2018-01-29 MED ORDER — POTASSIUM CHLORIDE CRYS ER 20 MEQ PO TBCR: 20.0000 meq | EXTENDED_RELEASE_TABLET | Freq: Every day | ORAL | 0 refills | Status: DC

## 2018-01-29 MED ORDER — CEPHALEXIN 500 MG PO CAPS: 500.0000 mg | ORAL_CAPSULE | Freq: Three times a day (TID) | ORAL | 0 refills | Status: DC

## 2018-01-29 MED ORDER — POTASSIUM CHLORIDE CRYS ER 20 MEQ PO TBCR
40.0000 meq | EXTENDED_RELEASE_TABLET | Freq: Once | ORAL | Status: AC
  Administered 2018-01-29: 40 meq via ORAL
  Filled 2018-01-29: qty 2

## 2018-01-29 MED ORDER — SODIUM CHLORIDE 0.9 % IV SOLN
1.0000 g | Freq: Once | INTRAVENOUS | Status: AC
  Administered 2018-01-29: 1 g via INTRAVENOUS
  Filled 2018-01-29: qty 10

## 2018-01-29 MED ORDER — SODIUM CHLORIDE 0.9 % IV BOLUS
500.0000 mL | Freq: Once | INTRAVENOUS | Status: AC
  Administered 2018-01-29: 500 mL via INTRAVENOUS
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
## 2018-01-29 NOTE — ED Notes (Signed)
Patient verbalizes understanding of discharge instructions. Opportunity for questioning and answers were provided. Armband removed by staff, pt discharged from ED ambulatory to home.  

## 2018-01-29 NOTE — ED Triage Notes (Signed)
Pt states that he has had generalized weakness that has been going on for the past week, worse today, denies n/v/d. Pt reports blurred vision and watering of the r eye for several months, and pain in the L ear, with head congestion. 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

## 2018-02-07 LAB — SUSCEPTIBILITY RESULT

## 2018-02-07 LAB — SUSCEPTIBILITY, AER + ANAEROB

## 2018-02-12 LAB — URINE CULTURE: Culture: >=100000 — AB

## 2018-02-13 ENCOUNTER — Telehealth: Payer: Self-pay | Admitting: *Deleted

## 2018-02-13 NOTE — Progress Notes (Signed)
ED Antimicrobial Stewardship Positive Culture Follow Up   Harry [name] is an 72 y.o. male who presented to [facility] on 01/29/2018 with a chief complaint of  Chief Complaint  Patient presents with  . Weakness    Recent Results (from the past 720 hour(s))  Urine culture     Status: Abnormal   Collection Time: 01/29/18 10:17 PM  Result Value Ref Range Status   Specimen Description URINE, RANDOM  Final   Special Requests NONE  Final   Culture (A)  Final    >=100,000 COLONIES/mL ENTEROCOCCUS FAECALIS SEE SEPARATE REPORT FOR SUSCEPTIBILITY RESULTS Performed at [employer] Performed at [facility], [address], [place], [place] [address]    Report Status 02/12/2018 FINAL  Final  Susceptibility, Aer + Anaerob     Status: None   Collection Time: 01/29/18 11:57 PM  Result Value Ref Range Status   Suscept, Aer + Anaerob Final report  Corrected    Comment: (NOTE) Performed At: [facility] [address] [place], [place] [id number] Rush Farmer MD WU:[id number] CORRECTED ON 12/06 AT 0035: PREVIOUSLY REPORTED AS Preliminary report    Source of Sample URINE ENTEROCOCCUS FAECALIS   Final    Comment: Performed at [facility], [place] [address], [place],  [address]  Susceptibility Result     Status: None   Collection Time: 01/29/18 11:57 PM  Result Value Ref Range Status   Suscept Result 1 Enterococcus faecalis  Final    Comment: (NOTE) Identification performed by account, not confirmed by this laboratory. Ciprofloxacin <=1 ug/ml Susceptible Nitrofurantoin <=32 ug/ml Susceptible Levofloxacin <=0.5 ug/ml Susceptible Tetracycline >8 ug/ml Resistant    Antimicrobial Suscept Comment  Final    Comment: (NOTE)      ** S = Susceptible; I = Intermediate; R = Resistant **                   P = Positive; N = Negative            MICS are expressed in micrograms per mL   Antibiotic                 RSLT#1    RSLT#2    RSLT#3    RSLT#4 Penicillin                      S Vancomycin                     S Performed At: [facility] [address] [place], [place] [id number] Rush Farmer MD TA:[id number]     [x]  Treated with cephalexin, organism resistant to prescribed antimicrobial []  Patient discharged originally without antimicrobial agent and treatment is now indicated  New antibiotic prescription: Amoxicillin 500mg  po q8h x 5 days  ED Provider: Suella Broad, PA-C   Sherlon Handing, PharmD, BCPS Clinical pharmacist  **Pharmacist phone directory can now be found on [email or web address] ([email or web address]).  Listed under [place]. 02/13/2018, 11:20 AM Clinical Pharmacist Monday - Friday phone -  [phone] Saturday - Sunday phone - [phone]

## 2018-02-13 NOTE — Telephone Encounter (Signed)
Post ED Visit - Positive Culture Follow-up: Unsuccessful Patient Follow-up  Culture assessed and recommendations reviewed by:  []  Elenor Quinones, Pharm.D. []  Heide Guile, Pharm.D., BCPS AQ-ID []  Parks Neptune, Pharm.D., BCPS []  Alycia Rossetti, Pharm.D., BCPS []  [place], Pharm.D., BCPS, AAHIVP []  Legrand Como, Pharm.D., BCPS, AAHIVP []  Wynell Balloon, PharmD []  Vincenza Hews, PharmD, BCPS  Positive urine culture, reviewed by Suella Broad, PharmD  []  Patient discharged without antimicrobial prescription and treatment is now indicated [x]  Organism is resistant to prescribed ED discharge antimicrobial []  Patient with positive blood cultures  Plan: Stop Cephalexin, Start Amoxicillin 500mg  PO q8 hours x 5 days  Unable to contact patient after 3 attempts, letter will be sent to address on file  Ardeen Fillers 02/13/2018, 12:05 PM

## 2018-02-18 DIAGNOSIS — Z1331 Encounter for screening for depression: Secondary | ICD-10-CM | POA: Diagnosis not present

## 2018-02-18 DIAGNOSIS — Z1389 Encounter for screening for other disorder: Secondary | ICD-10-CM | POA: Diagnosis not present

## 2018-02-18 DIAGNOSIS — E559 Vitamin D deficiency, unspecified: Secondary | ICD-10-CM | POA: Diagnosis not present

## 2018-02-18 DIAGNOSIS — F339 Major depressive disorder, recurrent, unspecified: Secondary | ICD-10-CM | POA: Diagnosis not present

## 2018-02-18 DIAGNOSIS — E039 Hypothyroidism, unspecified: Secondary | ICD-10-CM | POA: Diagnosis not present

## 2018-02-18 DIAGNOSIS — E1165 Type 2 diabetes mellitus with hyperglycemia: Secondary | ICD-10-CM | POA: Diagnosis not present

## 2018-02-18 DIAGNOSIS — I10 Essential (primary) hypertension: Secondary | ICD-10-CM | POA: Diagnosis not present

## 2018-02-18 DIAGNOSIS — F17210 Nicotine dependence, cigarettes, uncomplicated: Secondary | ICD-10-CM | POA: Diagnosis not present

## 2018-02-18 DIAGNOSIS — Z0000 Encounter for general adult medical examination without abnormal findings: Secondary | ICD-10-CM | POA: Diagnosis not present

## 2018-02-18 DIAGNOSIS — E113591 Type 2 diabetes mellitus with proliferative diabetic retinopathy without macular edema, right eye: Secondary | ICD-10-CM | POA: Diagnosis not present

## 2018-02-18 DIAGNOSIS — E1143 Type 2 diabetes mellitus with diabetic autonomic (poly)neuropathy: Secondary | ICD-10-CM | POA: Diagnosis not present

## 2018-02-18 DIAGNOSIS — I951 Orthostatic hypotension: Secondary | ICD-10-CM | POA: Diagnosis not present

## 2018-02-24 ENCOUNTER — Encounter (HOSPITAL_COMMUNITY): Payer: Self-pay | Admitting: Emergency Medicine

## 2018-02-24 ENCOUNTER — Observation Stay (HOSPITAL_COMMUNITY)
Admission: EM | Admit: 2018-02-24 | Discharge: 2018-02-25 | Disposition: A | Discharge status: Home / Self Care | Payer: Medicare Other | Attending: Internal Medicine | Admitting: Internal Medicine

## 2018-02-24 DIAGNOSIS — E1151 Type 2 diabetes mellitus with diabetic peripheral angiopathy without gangrene: Secondary | ICD-10-CM | POA: Diagnosis not present

## 2018-02-24 DIAGNOSIS — N183 Chronic kidney disease, stage 3 unspecified: Secondary | ICD-10-CM | POA: Diagnosis present

## 2018-02-24 DIAGNOSIS — E876 Hypokalemia: Secondary | ICD-10-CM | POA: Insufficient documentation

## 2018-02-24 DIAGNOSIS — E119 Type 2 diabetes mellitus without complications: Secondary | ICD-10-CM

## 2018-02-24 DIAGNOSIS — I10 Essential (primary) hypertension: Secondary | ICD-10-CM | POA: Diagnosis present

## 2018-02-24 DIAGNOSIS — Z8673 Personal history of transient ischemic attack (TIA), and cerebral infarction without residual deficits: Secondary | ICD-10-CM | POA: Insufficient documentation

## 2018-02-24 DIAGNOSIS — I951 Orthostatic hypotension: Principal | ICD-10-CM | POA: Insufficient documentation

## 2018-02-24 DIAGNOSIS — R569 Unspecified convulsions: Secondary | ICD-10-CM

## 2018-02-24 DIAGNOSIS — Z8249 Family history of ischemic heart disease and other diseases of the circulatory system: Secondary | ICD-10-CM | POA: Insufficient documentation

## 2018-02-24 DIAGNOSIS — F17200 Nicotine dependence, unspecified, uncomplicated: Secondary | ICD-10-CM | POA: Diagnosis not present

## 2018-02-24 DIAGNOSIS — Z7982 Long term (current) use of aspirin: Secondary | ICD-10-CM | POA: Diagnosis not present

## 2018-02-24 DIAGNOSIS — E1122 Type 2 diabetes mellitus with diabetic chronic kidney disease: Secondary | ICD-10-CM

## 2018-02-24 DIAGNOSIS — I959 Hypotension, unspecified: Secondary | ICD-10-CM | POA: Diagnosis not present

## 2018-02-24 DIAGNOSIS — Z9119 Patient's noncompliance with other medical treatment and regimen: Secondary | ICD-10-CM | POA: Insufficient documentation

## 2018-02-24 DIAGNOSIS — E785 Hyperlipidemia, unspecified: Secondary | ICD-10-CM | POA: Diagnosis not present

## 2018-02-24 DIAGNOSIS — Z794 Long term (current) use of insulin: Secondary | ICD-10-CM | POA: Insufficient documentation

## 2018-02-24 DIAGNOSIS — E1140 Type 2 diabetes mellitus with diabetic neuropathy, unspecified: Secondary | ICD-10-CM | POA: Insufficient documentation

## 2018-02-24 DIAGNOSIS — F0281 Dementia in other diseases classified elsewhere with behavioral disturbance: Secondary | ICD-10-CM | POA: Insufficient documentation

## 2018-02-24 DIAGNOSIS — F17210 Nicotine dependence, cigarettes, uncomplicated: Secondary | ICD-10-CM | POA: Insufficient documentation

## 2018-02-24 DIAGNOSIS — I129 Hypertensive chronic kidney disease with stage 1 through stage 4 chronic kidney disease, or unspecified chronic kidney disease: Secondary | ICD-10-CM | POA: Insufficient documentation

## 2018-02-24 DIAGNOSIS — Z79899 Other long term (current) drug therapy: Secondary | ICD-10-CM | POA: Diagnosis not present

## 2018-02-24 DIAGNOSIS — N1832 Chronic kidney disease, stage 3b: Secondary | ICD-10-CM | POA: Diagnosis present

## 2018-02-24 DIAGNOSIS — R55 Syncope and collapse: Secondary | ICD-10-CM | POA: Diagnosis present

## 2018-02-24 DIAGNOSIS — Z7902 Long term (current) use of antithrombotics/antiplatelets: Secondary | ICD-10-CM | POA: Insufficient documentation

## 2018-02-24 DIAGNOSIS — R531 Weakness: Secondary | ICD-10-CM | POA: Diagnosis not present

## 2018-02-24 LAB — BASIC METABOLIC PANEL
Anion gap: 12 (ref 5–15)
BUN: 26 mg/dL — ABNORMAL HIGH (ref 8–23)
CO2: 25 mmol/L (ref 22–32)
Calcium: 8.6 mg/dL — ABNORMAL LOW (ref 8.9–10.3)
Chloride: 104 mmol/L (ref 98–111)
Creatinine, Ser: 2.06 mg/dL — ABNORMAL HIGH (ref 0.61–1.24)
GFR calc Af Amer: 36 mL/min — ABNORMAL LOW (ref >60)
GFR calc non Af Amer: 31 mL/min — ABNORMAL LOW (ref >60)
Glucose, Bld: 224 mg/dL — ABNORMAL HIGH (ref 70–99)
Potassium: 3.6 mmol/L (ref 3.5–5.1)
Sodium: 141 mmol/L (ref 135–145)

## 2018-02-24 LAB — URINALYSIS, ROUTINE W REFLEX MICROSCOPIC
Bilirubin Urine: NEGATIVE
Glucose, UA: >=500 mg/dL — AB
Ketones, ur: NEGATIVE mg/dL
Leukocytes, UA: NEGATIVE
Nitrite: NEGATIVE
Protein, ur: NEGATIVE mg/dL
Specific Gravity, Urine: 1.015 (ref 1.005–1.030)
pH: 6 (ref 5.0–8.0)

## 2018-02-24 LAB — CBC WITH DIFFERENTIAL/PLATELET
Abs Immature Granulocytes: 0.03 10*3/uL (ref 0.00–0.07)
Basophils Absolute: 0 10*3/uL (ref 0.0–0.1)
Basophils Relative: 1 %
Eosinophils Absolute: 0.1 10*3/uL (ref 0.0–0.5)
Eosinophils Relative: 2 %
HCT: 32.5 % — ABNORMAL LOW (ref 39.0–52.0)
Hemoglobin: 10.1 g/dL — ABNORMAL LOW (ref 13.0–17.0)
Immature Granulocytes: 1 %
Lymphocytes Relative: 25 %
Lymphs Abs: 1.5 10*3/uL (ref 0.7–4.0)
MCH: 29.1 pg (ref 26.0–34.0)
MCHC: 31.1 g/dL (ref 30.0–36.0)
MCV: 93.7 fL (ref 80.0–100.0)
Monocytes Absolute: 0.5 10*3/uL (ref 0.1–1.0)
Monocytes Relative: 8 %
Neutro Abs: 3.8 10*3/uL (ref 1.7–7.7)
Neutrophils Relative %: 63 %
Platelets: 252 10*3/uL (ref 150–400)
RBC: 3.47 MIL/uL — ABNORMAL LOW (ref 4.22–5.81)
RDW: 14.4 % (ref 11.5–15.5)
WBC: 6 10*3/uL (ref 4.0–10.5)
nRBC: 0 % (ref 0.0–0.2)

## 2018-02-24 LAB — URINALYSIS, MICROSCOPIC (REFLEX)

## 2018-02-24 LAB — I-STAT TROPONIN, ED: Troponin i, poc: 0.01 ng/mL (ref 0.00–0.08)

## 2018-02-24 LAB — RAPID URINE DRUG SCREEN, HOSP PERFORMED
Amphetamines: NOT DETECTED
Barbiturates: NOT DETECTED
Benzodiazepines: NOT DETECTED
Cocaine: NOT DETECTED
Opiates: NOT DETECTED
Tetrahydrocannabinol: NOT DETECTED

## 2018-02-24 LAB — TSH: TSH: 1.703 u[IU]/mL (ref 0.350–4.500)

## 2018-02-24 LAB — CBG MONITORING, ED: Glucose-Capillary: 193 mg/dL — ABNORMAL HIGH (ref 70–99)

## 2018-02-24 MED ORDER — SODIUM CHLORIDE 0.9 % IV BOLUS: 1000.0000 mL | Freq: Once | INTRAVENOUS | Status: DC

## 2018-02-24 MED ORDER — ONDANSETRON HCL 4 MG/2ML IJ SOLN: 4.0000 mg | Freq: Four times a day (QID) | INTRAMUSCULAR | Status: DC | PRN

## 2018-02-24 MED ORDER — ASPIRIN EC 81 MG PO TBEC
81.0000 mg | DELAYED_RELEASE_TABLET | Freq: Every day | ORAL | Status: DC
  Administered 2018-02-25: 81 mg via ORAL
  Filled 2018-02-24: qty 1

## 2018-02-24 MED ORDER — ROSUVASTATIN CALCIUM 5 MG PO TABS
5.0000 mg | ORAL_TABLET | Freq: Every day | ORAL | Status: DC
  Administered 2018-02-25: 5 mg via ORAL
  Filled 2018-02-24: qty 1

## 2018-02-24 MED ORDER — POLYVINYL ALCOHOL 1.4 % OP SOLN: 1.0000 [drp] | Freq: Two times a day (BID) | OPHTHALMIC | Status: DC | PRN

## 2018-02-24 MED ORDER — HYDRALAZINE HCL 20 MG/ML IJ SOLN
5.0000 mg | INTRAMUSCULAR | Status: DC | PRN
  Administered 2018-02-24: 5 mg via INTRAVENOUS
  Filled 2018-02-24: qty 1

## 2018-02-24 MED ORDER — SERTRALINE HCL 50 MG PO TABS
25.0000 mg | ORAL_TABLET | Freq: Every day | ORAL | Status: DC
  Administered 2018-02-25: 25 mg via ORAL
  Filled 2018-02-24: qty 1

## 2018-02-24 MED ORDER — INSULIN GLARGINE 100 UNIT/ML ~~LOC~~ SOLN
30.0000 [IU] | Freq: Every day | SUBCUTANEOUS | Status: DC
  Administered 2018-02-24: 30 [IU] via SUBCUTANEOUS
  Filled 2018-02-24 (×2): qty 0.3

## 2018-02-24 MED ORDER — ENOXAPARIN SODIUM 30 MG/0.3ML ~~LOC~~ SOLN
30.0000 mg | SUBCUTANEOUS | Status: DC
  Administered 2018-02-24: 30 mg via SUBCUTANEOUS
  Filled 2018-02-24: qty 0.3

## 2018-02-24 MED ORDER — ONDANSETRON HCL 4 MG PO TABS: 4.0000 mg | ORAL_TABLET | Freq: Four times a day (QID) | ORAL | Status: DC | PRN

## 2018-02-24 MED ORDER — ACETAMINOPHEN 325 MG PO TABS: 650.0000 mg | ORAL_TABLET | Freq: Four times a day (QID) | ORAL | Status: DC | PRN

## 2018-02-24 MED ORDER — MORPHINE SULFATE (PF) 2 MG/ML IV SOLN: 2.0000 mg | INTRAVENOUS | Status: DC | PRN

## 2018-02-24 MED ORDER — FLUDROCORTISONE ACETATE 0.1 MG PO TABS
0.1000 mg | ORAL_TABLET | Freq: Every day | ORAL | Status: DC
  Administered 2018-02-25: 0.1 mg via ORAL
  Filled 2018-02-24: qty 1

## 2018-02-24 MED ORDER — SODIUM CHLORIDE 0.9% FLUSH
3.0000 mL | Freq: Two times a day (BID) | INTRAVENOUS | Status: DC
  Administered 2018-02-24 – 2018-02-25 (×2): 3 mL via INTRAVENOUS

## 2018-02-24 MED ORDER — INSULIN ASPART 100 UNIT/ML ~~LOC~~ SOLN: 0.0000 [IU] | Freq: Every day | SUBCUTANEOUS | Status: DC

## 2018-02-24 MED ORDER — INSULIN ASPART 100 UNIT/ML ~~LOC~~ SOLN
0.0000 [IU] | Freq: Three times a day (TID) | SUBCUTANEOUS | Status: DC
  Administered 2018-02-25: 2 [IU] via SUBCUTANEOUS

## 2018-02-24 MED ORDER — BASAGLAR KWIKPEN 100 UNIT/ML ~~LOC~~ SOPN: 30.0000 [IU] | PEN_INJECTOR | Freq: Every day | SUBCUTANEOUS | Status: DC

## 2018-02-24 MED ORDER — SODIUM CHLORIDE 0.9 % IV BOLUS
500.0000 mL | Freq: Once | INTRAVENOUS | Status: AC
  Administered 2018-02-24: 500 mL via INTRAVENOUS

## 2018-02-24 MED ORDER — CLOPIDOGREL BISULFATE 75 MG PO TABS
75.0000 mg | ORAL_TABLET | Freq: Every day | ORAL | Status: DC
  Administered 2018-02-25: 75 mg via ORAL
  Filled 2018-02-24: qty 1

## 2018-02-24 MED ORDER — ACETAMINOPHEN 650 MG RE SUPP: 650.0000 mg | Freq: Four times a day (QID) | RECTAL | Status: DC | PRN
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
## 2018-02-24 NOTE — ED Triage Notes (Signed)
Pt arrives via [facility], states they were called out for possible seizure. Per family, they reported to ems that it looked like patient may be having a seizure. Patient has had similar episodes and has seen neurologist that ruled out epilepsy. [facility] reports patient hypotensive with BP 80/30 upon their arrival, however he was alert and did not appear post ictal.  patient was given fluids and bp improved to 154/80, HR 84, RR 18, CBG 190. Patient has L sided facial droop at baseline.
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
## 2018-02-24 NOTE — ED Notes (Signed)
Tray ordered for pt PA states pt can eat

## 2018-02-25 ENCOUNTER — Other Ambulatory Visit: Payer: Self-pay

## 2018-02-25 ENCOUNTER — Observation Stay (HOSPITAL_COMMUNITY): Payer: Medicare Other

## 2018-02-25 DIAGNOSIS — I10 Essential (primary) hypertension: Secondary | ICD-10-CM

## 2018-02-25 DIAGNOSIS — R55 Syncope and collapse: Secondary | ICD-10-CM | POA: Diagnosis not present

## 2018-02-25 DIAGNOSIS — E1151 Type 2 diabetes mellitus with diabetic peripheral angiopathy without gangrene: Secondary | ICD-10-CM | POA: Diagnosis not present

## 2018-02-25 DIAGNOSIS — E785 Hyperlipidemia, unspecified: Secondary | ICD-10-CM | POA: Diagnosis not present

## 2018-02-25 DIAGNOSIS — E1122 Type 2 diabetes mellitus with diabetic chronic kidney disease: Secondary | ICD-10-CM | POA: Diagnosis not present

## 2018-02-25 DIAGNOSIS — F17210 Nicotine dependence, cigarettes, uncomplicated: Secondary | ICD-10-CM | POA: Diagnosis not present

## 2018-02-25 DIAGNOSIS — N183 Chronic kidney disease, stage 3 (moderate): Secondary | ICD-10-CM

## 2018-02-25 DIAGNOSIS — Z7982 Long term (current) use of aspirin: Secondary | ICD-10-CM | POA: Diagnosis not present

## 2018-02-25 DIAGNOSIS — Z9119 Patient's noncompliance with other medical treatment and regimen: Secondary | ICD-10-CM | POA: Diagnosis not present

## 2018-02-25 DIAGNOSIS — Z8673 Personal history of transient ischemic attack (TIA), and cerebral infarction without residual deficits: Secondary | ICD-10-CM | POA: Diagnosis not present

## 2018-02-25 DIAGNOSIS — I951 Orthostatic hypotension: Secondary | ICD-10-CM | POA: Diagnosis not present

## 2018-02-25 DIAGNOSIS — E1140 Type 2 diabetes mellitus with diabetic neuropathy, unspecified: Secondary | ICD-10-CM | POA: Diagnosis not present

## 2018-02-25 DIAGNOSIS — I129 Hypertensive chronic kidney disease with stage 1 through stage 4 chronic kidney disease, or unspecified chronic kidney disease: Secondary | ICD-10-CM | POA: Diagnosis not present

## 2018-02-25 DIAGNOSIS — Z8249 Family history of ischemic heart disease and other diseases of the circulatory system: Secondary | ICD-10-CM | POA: Diagnosis not present

## 2018-02-25 LAB — GLUCOSE, CAPILLARY
Glucose-Capillary: 48 mg/dL — ABNORMAL LOW (ref 70–99)
Glucose-Capillary: 53 mg/dL — ABNORMAL LOW (ref 70–99)
Glucose-Capillary: 152 mg/dL — ABNORMAL HIGH (ref 70–99)
Glucose-Capillary: 126 mg/dL — ABNORMAL HIGH (ref 70–99)
Glucose-Capillary: 98 mg/dL (ref 70–99)

## 2018-02-25 LAB — BASIC METABOLIC PANEL
Anion gap: 9 (ref 5–15)
BUN: 26 mg/dL — ABNORMAL HIGH (ref 8–23)
CO2: 26 mmol/L (ref 22–32)
Calcium: 8.7 mg/dL — ABNORMAL LOW (ref 8.9–10.3)
Chloride: 105 mmol/L (ref 98–111)
Creatinine, Ser: 1.84 mg/dL — ABNORMAL HIGH (ref 0.61–1.24)
GFR calc Af Amer: 42 mL/min — ABNORMAL LOW (ref >60)
GFR calc non Af Amer: 36 mL/min — ABNORMAL LOW (ref >60)
Glucose, Bld: 82 mg/dL (ref 70–99)
Potassium: 3.1 mmol/L — ABNORMAL LOW (ref 3.5–5.1)
Sodium: 140 mmol/L (ref 135–145)

## 2018-02-25 LAB — CBC
HCT: 29.6 % — ABNORMAL LOW (ref 39.0–52.0)
Hemoglobin: 9.5 g/dL — ABNORMAL LOW (ref 13.0–17.0)
MCH: 29 pg (ref 26.0–34.0)
MCHC: 32.1 g/dL (ref 30.0–36.0)
MCV: 90.2 fL (ref 80.0–100.0)
Platelets: 229 10*3/uL (ref 150–400)
RBC: 3.28 MIL/uL — ABNORMAL LOW (ref 4.22–5.81)
RDW: 14.4 % (ref 11.5–15.5)
WBC: 6.2 10*3/uL (ref 4.0–10.5)
nRBC: 0 % (ref 0.0–0.2)

## 2018-02-25 MED ORDER — DIVALPROEX SODIUM 500 MG PO DR TAB: 500.0000 mg | DELAYED_RELEASE_TABLET | Freq: Two times a day (BID) | ORAL | 0 refills | Status: DC

## 2018-02-25 MED ORDER — POLYVINYL ALCOHOL 1.4 % OP SOLN
1.0000 [drp] | Freq: Two times a day (BID) | OPHTHALMIC | Status: DC | PRN
  Administered 2018-02-25: 1 [drp] via OPHTHALMIC
  Filled 2018-02-25: qty 15

## 2018-02-25 MED ORDER — ENOXAPARIN SODIUM 40 MG/0.4ML ~~LOC~~ SOLN: 40.0000 mg | SUBCUTANEOUS | Status: DC

## 2018-02-25 MED ORDER — POTASSIUM CHLORIDE CRYS ER 20 MEQ PO TBCR
40.0000 meq | EXTENDED_RELEASE_TABLET | Freq: Once | ORAL | Status: AC
  Administered 2018-02-25: 40 meq via ORAL
  Filled 2018-02-25: qty 2

## 2018-02-25 MED ORDER — DIVALPROEX SODIUM 250 MG PO DR TAB
500.0000 mg | DELAYED_RELEASE_TABLET | Freq: Two times a day (BID) | ORAL | Status: DC
  Administered 2018-02-25 (×2): 500 mg via ORAL
  Filled 2018-02-25 (×2): qty 2

## 2018-02-25 NOTE — Progress Notes (Signed)
PROGRESS NOTE        PATIENT DETAILS Name: Harry [name] Age: 72 y.o. Sex: male Date of Birth: Dec 26, 1945 Admit Date: 02/24/2018 Admitting Physician Karmen Bongo, MD IEP:PIRJJ, [name], MD  Brief Narrative: Patient is a 72 y.o. male with history of DM-2, CVA, CKD stage III, numerous recent admissions for orthostatic hypotension causing syncope-presenting to the hospital for another episode of syncope, subsequently admitted to the hospitalist service for further evaluation and treatment.  Subjective: Lying comfortably in bed-no chest pain or shortness of breath.  Assessment/Plan: Syncope: High suspicion that this is likely orthostatic.  Per family-patient at times taking longer to recover from the syncopal episode-and as resolved question of seizures was raised.  EEG pending, CT head without no acute abnormalities.  Neurology evaluation appreciated-he also has a history of dementia-and has been started on Depakote 500 mg twice daily-this would help both delirium with dementia and seizures if he were to have.  Telemetry without any arrhythmias.  Orthostatic hypotension: Lifestyle changes discussed with patient-ordered thigh-high TED hose.  Continue Florinef.  Ambulate with PT and see how he does.  Hypokalemia: Replete and recheck, could be from Florinef.  Dyslipidemia: Continue Crestor  DM-2: CBG stable, continue Lantus and SSI  History of PAD: Continue aspirin/Plavix and Crestor  Prior history of CVA: Nonfocal exam-continue on dual antiplatelet agents and Crestor  CKD stage III: Creatinine close to usual baseline follow.  Tobacco use: Counseled:  Dementia: Mildly confused but easily redirectable-appears very close to usual baseline  Awaiting EEG, final neurology recommendations and PT evaluation before discharge.  DVT Prophylaxis: Prophylactic Lovenox  Code Status: Full code  Family Communication: None at bedside  Disposition Plan: Remain  inpatient-await work-up before consideration of discharge  Antimicrobial agents: Anti-infectives (From admission, onward)   None     Procedures: None  CONSULTS:  neurology  Time spent: 25- minutes-Greater than 50% of this time was spent in counseling, explanation of diagnosis, planning of further management, and coordination of care.  MEDICATIONS: Scheduled Meds: . aspirin EC  81 mg Oral Daily  . clopidogrel  75 mg Oral Daily  . divalproex  500 mg Oral Q12H  . enoxaparin (LOVENOX) injection  30 mg Subcutaneous Q24H  . fludrocortisone  0.1 mg Oral Daily  . insulin aspart  0-15 Units Subcutaneous TID WC  . insulin aspart  0-5 Units Subcutaneous QHS  . insulin glargine  30 Units Subcutaneous QHS  . rosuvastatin  5 mg Oral Daily  . sertraline  25 mg Oral Daily  . sodium chloride flush  3 mL Intravenous Q12H   Continuous Infusions: PRN Meds:.acetaminophen **OR** acetaminophen, hydrALAZINE, morphine injection, ondansetron **OR** ondansetron (ZOFRAN) IV, polyvinyl alcohol   PHYSICAL EXAM: Vital signs: Vitals:   02/25/18 0111 02/25/18 0349 02/25/18 0821 02/25/18 1205  BP: 112/64 134/78 (!) 159/88 (!) 179/84  Pulse: 93 87 92 90  Resp: 20 18    Temp: 98.3 F (36.8 C) 98.6 F (37 C)  98.2 F (36.8 C)  TempSrc: Oral Oral  Oral  SpO2: 100% 99% 99% 100%  Weight:  78.5 kg    Height:       Filed Weights   02/24/18 1825 02/25/18 0349  Weight: 79.2 kg 78.5 kg   Body mass index is 23.46 kg/m.   General appearance :Awake, alert, not in any distress.  HEENT: Atraumatic and Normocephalic Neck: supple,  no JVD.  Resp:Good air entry bilaterally, no added sounds  CVS: S1 S2 regular, no murmurs.  GI: Bowel sounds present, Non tender and not distended with no gaurding, rigidity or rebound.No organomegaly Extremities: B/L Lower Ext shows no edema, both legs are warm to touch Neurology: Nonfocal Musculoskeletal:No digital cyanosis Skin:No Rash, warm and dry Wounds:N/A  I have  personally reviewed following labs and imaging studies  LABORATORY DATA: CBC: Recent Labs  Lab 02/24/18 1336 02/25/18 0422  WBC 6.0 6.2  NEUTROABS 3.8  --   HGB 10.1* 9.5*  HCT 32.5* 29.6*  MCV 93.7 90.2  PLT 252 161    Basic Metabolic Panel: Recent Labs  Lab 02/24/18 1336 02/25/18 0422  NA 141 140  K 3.6 3.1*  CL 104 105  CO2 25 26  GLUCOSE 224* 82  BUN 26* 26*  CREATININE 2.06* 1.84*  CALCIUM 8.6* 8.7*    GFR: Estimated Creatinine Clearance: 40.4 mL/min (A) (by C-G formula based on SCr of 1.84 mg/dL (H)).  Liver Function Tests: No results for input(s): AST, ALT, ALKPHOS, BILITOT, PROT, ALBUMIN in the last 168 hours. No results for input(s): LIPASE, AMYLASE in the last 168 hours. No results for input(s): AMMONIA in the last 168 hours.  Coagulation Profile: No results for input(s): INR, PROTIME in the last 168 hours.  Cardiac Enzymes: No results for input(s): CKTOTAL, CKMB, CKMBINDEX, TROPONINI in the last 168 hours.  BNP (last 3 results) No results for input(s): PROBNP in the last 8760 hours.  HbA1C: No results for input(s): HGBA1C in the last 72 hours.  CBG: Recent Labs  Lab 02/24/18 1410 02/25/18 0736 02/25/18 0806 02/25/18 0911 02/25/18 1204  GLUCAP 193* 53* 48* 152* 126*    Lipid Profile: No results for input(s): CHOL, HDL, LDLCALC, TRIG, CHOLHDL, LDLDIRECT in the last 72 hours.  Thyroid Function Tests: Recent Labs    02/24/18 1830  TSH 1.703    Anemia Panel: No results for input(s): VITAMINB12, FOLATE, FERRITIN, TIBC, IRON, RETICCTPCT in the last 72 hours.  Urine analysis:    Component Value Date/Time   COLORURINE YELLOW 02/24/2018 1455   APPEARANCEUR CLEAR 02/24/2018 1455   LABSPEC 1.015 02/24/2018 1455   PHURINE 6.0 02/24/2018 1455   GLUCOSEU >=500 (A) 02/24/2018 1455   HGBUR TRACE (A) 02/24/2018 1455   BILIRUBINUR NEGATIVE 02/24/2018 1455   KETONESUR NEGATIVE 02/24/2018 1455   PROTEINUR NEGATIVE 02/24/2018 1455    UROBILINOGEN 0.2 10/09/2014 2112   NITRITE NEGATIVE 02/24/2018 1455   LEUKOCYTESUR NEGATIVE 02/24/2018 1455    Sepsis Labs: Lactic Acid, Venous    Component Value Date/Time   LATICACIDVEN 1.2 07/01/2016 0313    MICROBIOLOGY: No results found for this or any previous visit (from the past 240 hour(s)).  RADIOLOGY STUDIES/RESULTS: Ct Head Wo Contrast  Result Date: 02/25/2018 CLINICAL DATA:  Syncopal episodes. EXAM: CT HEAD WITHOUT CONTRAST TECHNIQUE: Contiguous axial images were obtained from the base of the skull through the vertex without intravenous contrast. COMPARISON:  11/22/2017 FINDINGS: Brain: No evidence of acute infarction, hemorrhage, hydrocephalus, extra-axial collection or mass lesion/mass effect. There is mild diffuse low-attenuation within the subcortical and periventricular white matter compatible with chronic microvascular disease. Prominence of the sulci and ventricles compatible with brain atrophy. Chronic left occipital lobe infarct. Bilateral chronic cerebellar infarcts and pons infarcts are again noted and appear unchanged from previous exam. Vascular: No hyperdense vessel or unexpected calcification. Skull: Normal. Negative for fracture or focal lesion. Sinuses/Orbits: No acute finding. Other: None. IMPRESSION: 1. No acute intracranial  abnormalities. 2. Again seen are chronic left PCA territory infarct, pons infarct, and smaller right cerebellar hemisphere infarcts. Similar to previous exam. Electronically Signed   By: Kerby Moors M.D.   On: 02/25/2018 10:59     LOS: 0 days   Oren Binet, MD  [facility]  If 7PM-7AM, please contact night-coverage  Please page via [email or web address]-Password [email or web address]-click on MD name and type text message  02/25/2018, 1:03 PM

## 2018-02-25 NOTE — Progress Notes (Signed)
Same-day progress note on diagnostic studies.  CTH IMPRESSION: 1. No acute intracranial abnormalities. 2. Again seen are chronic left PCA territory infarct, pons infarct, and smaller right cerebellar hemisphere infarcts. Similar to previous exam.   EEG Normal EEG.   Recs: As in consult before.  No new additions recommendations.

## 2018-02-25 NOTE — Progress Notes (Signed)
PT Cancellation Note  Patient Details Name: Harry [name] MRN: [id number] DOB: 08-14-1945   Cancelled Treatment:    Reason Eval/Treat Not Completed: Patient at procedure or test/unavailable (currently off floor getting EEG). Will follow-up for PT evaluation as schedule permits.  Mabeline Caras, PT, DPT Acute Rehabilitation Services  Pager [phone] Office [place] 02/25/2018, 1:38 PM

## 2018-02-25 NOTE — Plan of Care (Signed)
Patient remained safe throughout the shift. Placed on bed alarm to ensure safety. Continued to monitor for safety.

## 2018-02-25 NOTE — Care Management Obs Status (Signed)
[facility] NOTIFICATION   Patient Details  Name: Harry [name] MRN: [id number] Date of Birth: 1946-01-05   Medicare Observation Status Notification Given:  Yes    Dawayne Patricia, RN 02/25/2018, 3:10 PM

## 2018-02-25 NOTE — Progress Notes (Signed)
EEG Completed; Results Pending  

## 2018-02-25 NOTE — Discharge Summary (Signed)
PATIENT DETAILS Name: Harry [name] Age: 72 y.o. Sex: male Date of Birth: 12-Dec-1945 MRN: [id number]. Admitting Physician: Karmen Bongo, MD FBP:[id number], Brandon Melnick, MD  Admit Date: 02/24/2018 Discharge date: 02/25/2018  Recommendations for Outpatient Follow-up:  1. Follow up with PCP in 1-2 weeks 2. Please obtain BMP/CBC in one week 3. Please get depakote level in 1 week or so  Admitted From:  Home  Disposition: [place]:  Yes  Equipment/Devices: None  Discharge Condition: Stable  CODE STATUS: FULL CODE  Diet recommendation:  Heart Healthy / Carb Modified  Brief Summary: See H&P, Labs, Consult and Test reports for all details in brief, Patient is a 72 y.o. male with history of DM-2, CVA, CKD stage III, numerous recent admissions for orthostatic hypotension causing syncope-presenting to the hospital for another episode of syncope, subsequently admitted to the hospitalist service for further evaluation and treatment.  Brief Hospital Course: Syncope: High suspicion that this is likely orthostatic.  Per family-patient at times taking longer to recover from the syncopal episode-and as resolved question of seizures was raised.  EEG was negative, CT head without no acute abnormalities.  Neurology evaluation appreciated-he also has a history of dementia-and has been started on Depakote 500 mg twice daily-this would help both delirium with dementia and seizures if he were to have.  Telemetry without any arrhythmias.Patoient instructed to follow up with his primary Neurologist, epic referral sent to [facility]. Spoke with Dr Rory Percy (Neuro)-no further recommendations from neurology, ok to discharge  Orthostatic hypotension: Lifestyle changes discussed with patient-ordered thigh-high TED hose.  Continue Florinef. Per daughter-not compliant with lifestyle changes and ted hose   Hypokalemia: Replete and recheck as outpatient, could be from  [place].  Dyslipidemia: Continue Crestor  DM-2: CBG stable, continue Lantus and SSI  History of PAD: Continue aspirin/Plavix and Crestor  Prior history of CVA: Nonfocal exam-continue on dual antiplatelet agents and Crestor  CKD stage III: Creatinine close to usual baseline follow.  Tobacco use: Counseled:  Dementia: Mildly confused but easily redirectable-appears very close to usual baseline  Procedures/Studies: None  Discharge Diagnoses:  Principal Problem:   Syncope Active Problems:   Hyperlipidemia LDL goal <70   TOBACCO USE   Essential hypertension   Type II diabetes mellitus (HCC)   CKD (chronic kidney disease), stage III [facility])   Discharge Instructions:  Activity:  As tolerated   Discharge Instructions    Ambulatory referral to Neurology   Complete by:  As directed    An appointment is requested in approximately: 2 weeks   Diet - low sodium heart healthy   Complete by:  As directed    Diet Carb Modified   Complete by:  As directed    Discharge instructions   Complete by:  As directed    Follow with Primary MD  [name], MD in 1 week  Follow with Dr Lonni Fix in 1-2 weeks, office should call you with a appointment. If you do not hear from them, please give them a call  Please get a complete blood count and chemistry panel checked by your Primary MD at your next visit, and again as instructed by your Primary MD.  Get Medicines reviewed and adjusted: Please take all your medications with you for your next visit with your Primary MD  Laboratory/radiological data: Please request your Primary MD to go over all hospital tests and procedure/radiological results at the follow up, please ask your Primary MD to get all Hospital records sent to his/her  office.  In some cases, they will be blood work, cultures and biopsy results pending at the time of your discharge. Please request that your primary care M.D. follows up on these results.  Also Note the  following: If you experience worsening of your admission symptoms, develop shortness of breath, life threatening emergency, suicidal or homicidal thoughts you must seek medical attention immediately by calling 911 or calling your MD immediately  if symptoms less severe.  You must read complete instructions/literature along with all the possible adverse reactions/side effects for all the Medicines you take and that have been prescribed to you. Take any new Medicines after you have completely understood and accpet all the possible adverse reactions/side effects.   Do not drive when taking Pain medications or sleeping medications (Benzodaizepines)  Do not take more than prescribed Pain, Sleep and Anxiety Medications. It is not advisable to combine anxiety,sleep and pain medications without talking with your primary care practitioner  Special Instructions: If you have smoked or chewed Tobacco  in the last 2 yrs please stop smoking, stop any regular Alcohol  and or any Recreational drug use.  Wear Seat belts while driving.  Please note: You were cared for by a hospitalist during your hospital stay. Once you are discharged, your primary care physician will handle any further medical issues. Please note that NO REFILLS for any discharge medications will be authorized once you are discharged, as it is imperative that you return to your primary care physician (or establish a relationship with a primary care physician if you do not have one) for your post hospital discharge needs so that they can reassess your need for medications and monitor your lab values.   Driving Restrictions   Complete by:  As directed     Per [facility] statutes, patients with seizures are not allowed to drive until they have been seizure-free for six months. Use caution when using heavy equipment or power tools. Avoid working on ladders or at heights. Take showers instead of baths. Ensure the water temperature is not too  high on the home water heater. Do not go swimming alone. When caring for infants or small children, sit down when holding, feeding, or changing them to minimize risk of injury to the child in the event you have a seizure.   Increase activity slowly   Complete by:  As directed      Allergies as of 02/25/2018      Reactions   Lipitor [atorvastatin] Other (See Comments)   Myalgia   Statins Other (See Comments)   Myalgia (CAN tolerate Crestor, however)   Pravachol [pravastatin] Rash      Medication List    TAKE these medications   acetaminophen 500 MG tablet Commonly known as:  TYLENOL Take 1,000 mg by mouth every 6 (six) hours as needed (fever, headaches, or pain).   ARTIFICIAL TEARS 1.4 % ophthalmic solution Generic drug:  polyvinyl alcohol Place 1 drop into both eyes 2 (two) times daily as needed for dry eyes.   aspirin 81 MG EC tablet Take 1 tablet (81 mg total) by mouth daily.   BASAGLAR KWIKPEN 100 UNIT/ML Sopn Inject 30 Units into the skin at bedtime.   clopidogrel 75 MG tablet Commonly known as:  PLAVIX Take 1 tablet (75 mg total) by mouth daily.   divalproex 500 MG DR tablet Commonly known as:  DEPAKOTE Take 1 tablet (500 mg total) by mouth every 12 (twelve) hours.   fludrocortisone 0.1 MG tablet Commonly  known as:  FLORINEF Take 1 tablet (0.1 mg total) by mouth daily.   rosuvastatin 5 MG tablet Commonly known as:  CRESTOR Take 1 tablet (5 mg total) by mouth daily.   sertraline 25 MG tablet Commonly known as:  ZOLOFT TAKE ONE TABLET BY MOUTH DAILY      Follow-up Information    [name], MD. Schedule an appointment as soon as possible for a visit in 1 week(s).   Specialty:  Internal Medicine Contact information: [place] [address] [phone]        Penni Bombard, MD Follow up.   Specialties:  Neurology, Radiology Why:  epic referral sent, office should call you in a week, if you do not hear from them, please  give them a call. Contact information: [address]  [place] [address] [phone]          Allergies  Allergen Reactions  . Lipitor [Atorvastatin] Other (See Comments)    Myalgia   . Statins Other (See Comments)    Myalgia (CAN tolerate Crestor, however)  . Pravachol [Pravastatin] Rash    Consultations:   neurology  Other Procedures/Studies: Ct Head Wo Contrast  Result Date: 02/25/2018 CLINICAL DATA:  Syncopal episodes. EXAM: CT HEAD WITHOUT CONTRAST TECHNIQUE: Contiguous axial images were obtained from the base of the skull through the vertex without intravenous contrast. COMPARISON:  11/22/2017 FINDINGS: Brain: No evidence of acute infarction, hemorrhage, hydrocephalus, extra-axial collection or mass lesion/mass effect. There is mild diffuse low-attenuation within the subcortical and periventricular white matter compatible with chronic microvascular disease. Prominence of the sulci and ventricles compatible with brain atrophy. Chronic left occipital lobe infarct. Bilateral chronic cerebellar infarcts and pons infarcts are again noted and appear unchanged from previous exam. Vascular: No hyperdense vessel or unexpected calcification. Skull: Normal. Negative for fracture or focal lesion. Sinuses/Orbits: No acute finding. Other: None. IMPRESSION: 1. No acute intracranial abnormalities. 2. Again seen are chronic left PCA territory infarct, pons infarct, and smaller right cerebellar hemisphere infarcts. Similar to previous exam. Electronically Signed   By: Kerby Moors M.D.   On: 02/25/2018 10:59     TODAY-DAY OF DISCHARGE:  Subjective:   Cabe Lashley today has no headache,no chest abdominal pain,no new weakness tingling or numbness, feels much better wants to go home today.   Objective:   Blood pressure (!) 179/84, pulse 90, temperature 98.2 F (36.8 C), temperature source Oral, resp. rate 18, height 6' (1.829 m), weight 78.5 kg, SpO2 100  %.  Intake/Output Summary (Last 24 hours) at 02/25/2018 1535 Last data filed at 02/25/2018 2355 Gross per 24 hour  Intake 860 ml  Output 300 ml  Net 560 ml   Filed Weights   02/24/18 1825 02/25/18 0349  Weight: 79.2 kg 78.5 kg    Exam: Awake Alert, Oriented *3, No new F.N deficits, Normal affect [place].AT,PERRAL Supple Neck,No JVD, No cervical lymphadenopathy appriciated.  Symmetrical Chest wall movement, Good air movement bilaterally, CTAB RRR,No Gallops,Rubs or new Murmurs, No Parasternal Heave +ve B.Sounds, Abd Soft, Non tender, No organomegaly appriciated, No rebound -guarding or rigidity. No Cyanosis, Clubbing or edema, No new Rash or bruise   PERTINENT RADIOLOGIC STUDIES: Ct Head Wo Contrast  Result Date: 02/25/2018 CLINICAL DATA:  Syncopal episodes. EXAM: CT HEAD WITHOUT CONTRAST TECHNIQUE: Contiguous axial images were obtained from the base of the skull through the vertex without intravenous contrast. COMPARISON:  11/22/2017 FINDINGS: Brain: No evidence of acute infarction, hemorrhage, hydrocephalus, extra-axial collection or mass lesion/mass  effect. There is mild diffuse low-attenuation within the subcortical and periventricular white matter compatible with chronic microvascular disease. Prominence of the sulci and ventricles compatible with brain atrophy. Chronic left occipital lobe infarct. Bilateral chronic cerebellar infarcts and pons infarcts are again noted and appear unchanged from previous exam. Vascular: No hyperdense vessel or unexpected calcification. Skull: Normal. Negative for fracture or focal lesion. Sinuses/Orbits: No acute finding. Other: None. IMPRESSION: 1. No acute intracranial abnormalities. 2. Again seen are chronic left PCA territory infarct, pons infarct, and smaller right cerebellar hemisphere infarcts. Similar to previous exam. Electronically Signed   By: Kerby Moors M.D.   On: 02/25/2018 10:59     PERTINENT LAB RESULTS: CBC: Recent Labs     02/24/18 1336 02/25/18 0422  WBC 6.0 6.2  HGB 10.1* 9.5*  HCT 32.5* 29.6*  PLT 252 229   CMET CMP     Component Value Date/Time   NA 140 02/25/2018 0422   NA 143 11/11/2015 1243   K 3.1 (L) 02/25/2018 0422   CL 105 02/25/2018 0422   CO2 26 02/25/2018 0422   GLUCOSE 82 02/25/2018 0422   GLUCOSE 337 (H) 12/14/2005 1558   BUN 26 (H) 02/25/2018 0422   BUN 22 11/11/2015 1243   CREATININE 1.84 (H) 02/25/2018 0422   CREATININE 1.63 (H) 09/14/2014 1340   CALCIUM 8.7 (L) 02/25/2018 0422   PROT 7.0 11/22/2017 2022   PROT 7.5 11/11/2015 1243   ALBUMIN 3.8 11/22/2017 2022   ALBUMIN 4.3 11/11/2015 1243   AST 26 11/22/2017 2022   ALT 13 11/22/2017 2022   ALKPHOS 57 11/22/2017 2022   BILITOT 1.5 (H) 11/22/2017 2022   BILITOT 0.8 11/11/2015 1243   GFRNONAA 36 (L) 02/25/2018 0422   GFRAA 42 (L) 02/25/2018 0422    GFR Estimated Creatinine Clearance: 40.4 mL/min (A) (by C-G formula based on SCr of 1.84 mg/dL (H)). No results for input(s): LIPASE, AMYLASE in the last 72 hours. No results for input(s): CKTOTAL, CKMB, CKMBINDEX, TROPONINI in the last 72 hours. Invalid input(s): POCBNP No results for input(s): DDIMER in the last 72 hours. No results for input(s): HGBA1C in the last 72 hours. No results for input(s): CHOL, HDL, LDLCALC, TRIG, CHOLHDL, LDLDIRECT in the last 72 hours. Recent Labs    02/24/18 1830  TSH 1.703   No results for input(s): VITAMINB12, FOLATE, FERRITIN, TIBC, IRON, RETICCTPCT in the last 72 hours. Coags: No results for input(s): INR in the last 72 hours.  Invalid input(s): PT Microbiology: No results found for this or any previous visit (from the past 240 hour(s)).  FURTHER DISCHARGE INSTRUCTIONS:  Get Medicines reviewed and adjusted: Please take all your medications with you for your next visit with your Primary MD  Laboratory/radiological data: Please request your Primary MD to go over all hospital tests and procedure/radiological results at the  follow up, please ask your Primary MD to get all Hospital records sent to his/her office.  In some cases, they will be blood work, cultures and biopsy results pending at the time of your discharge. Please request that your primary care M.D. goes through all the records of your hospital data and follows up on these results.  Also Note the following: If you experience worsening of your admission symptoms, develop shortness of breath, life threatening emergency, suicidal or homicidal thoughts you must seek medical attention immediately by calling 911 or calling your MD immediately  if symptoms less severe.  You must read complete instructions/literature along with all the possible adverse  reactions/side effects for all the Medicines you take and that have been prescribed to you. Take any new Medicines after you have completely understood and accpet all the possible adverse reactions/side effects.   Do not drive when taking Pain medications or sleeping medications (Benzodaizepines)  Do not take more than prescribed Pain, Sleep and Anxiety Medications. It is not advisable to combine anxiety,sleep and pain medications without talking with your primary care practitioner  Special Instructions: If you have smoked or chewed Tobacco  in the last 2 yrs please stop smoking, stop any regular Alcohol  and or any Recreational drug use.  Wear Seat belts while driving.  Please note: You were cared for by a hospitalist during your hospital stay. Once you are discharged, your primary care physician will handle any further medical issues. Please note that NO REFILLS for any discharge medications will be authorized once you are discharged, as it is imperative that you return to your primary care physician (or establish a relationship with a primary care physician if you do not have one) for your post hospital discharge needs so that they can reassess your need for medications and monitor your lab values.  Total Time  spent coordinating discharge including counseling, education and face to face time equals 35 minutes.  SignedOren Binet 02/25/2018 3:35 PM

## 2018-02-25 NOTE — Progress Notes (Signed)
Called pharmacy to verify giving depakote a little early.  Per pharmacist ok to given even though 1st dose was taken at 11 am this dose take early

## 2018-02-25 NOTE — Evaluation (Signed)
Physical Therapy Evaluation Patient Details Name: Harry [name] MRN: [id number] DOB: [birth year] Today's Date: 02/25/2018   History of Present Illness  Pt is a 72 y.o. admitted 02/24/18 with sycnope; worked up for probable orthostatic hypotension. Head CT without acute abnormalities. EEG 12/24 normal. PMH includes DM2, CKDIII, CVA, numerous recent admissions for orthostatic hypotension causing syncope.    Clinical Impression  Pt presents with an overall decrease in functional mobility secondary to above. PTA, pt reports indep with mobility, intermittent use of RW; lives with family available for 24/7 support; reports 4-5 falls in the past 3 months all related to similar syncopal symptoms. Today, pt required min guard for transfers and ambulation with UE support; mobility limited secondary to positive orthostatic hypotension (see values below). Pt declined follow-up PT services. Pt would benefit from continued acute PT services to maximize functional mobility and independence prior to d/c home.   Orthostatic BPs  Supine 184/106  Sitting 129/86  Standing 99/61  Standing after 2 min 96/63  Return to supine 157/104      Follow Up Recommendations No PT follow up;Supervision for mobility/OOB(declined HHPT services)    Equipment Recommendations  None recommended by PT    Recommendations for Other Services       Precautions / Restrictions Precautions Precautions: Fall;Other (comment) Precaution Comments: Orthostatic hypotension Restrictions Weight Bearing Restrictions: No      Mobility  Bed Mobility Overal bed mobility: Independent             General bed mobility comments: Pt denies dizziness upon sitting despite (+) orthostatic hypotension  Transfers Overall transfer level: Needs assistance   Transfers: Sit to/from Stand Sit to Stand: Supervision         General transfer comment: Supervision for safety secondary to (+) orthostatic  hypotension  Ambulation/Gait Ambulation/Gait assistance: Min guard Gait Distance (Feet): 2 Feet Assistive device: 1 person hand held assist Gait Pattern/deviations: Step-to pattern;Trunk flexed Gait velocity: Decreased   General Gait Details: Reliant on UE support to maintain balance taking steps at EOB. Deferred further amb secondary to (+) orthostatics  Stairs            Wheelchair Mobility    Modified Rankin (Stroke Patients Only)       Balance Overall balance assessment: Needs assistance   Sitting balance-Leahy Scale: Good       Standing balance-Leahy Scale: Fair                               Pertinent Vitals/Pain Pain Assessment: No/denies pain    Home Living Family/patient expects to be discharged to:: Private residence Living Arrangements: Children Available Help at Discharge: Family;Available 24 hours/day Type of Home: House Home Access: Level entry     Home Layout: Two level Home Equipment: Walker - 2 wheels;Walker - 4 wheels;Cane - single point Additional Comments: Most equipment was for his wife    Prior Function Level of Independence: Needs assistance   Gait / Transfers Assistance Needed: Household ambulation without DME. Reports 4-5 falls in the past 3 months. RW for community ambulation. Does not drive  ADL's / Homemaking Assistance Needed: Reports indep for ADLs        Hand Dominance        Extremity/Trunk Assessment   Upper Extremity Assessment Upper Extremity Assessment: Overall WFL for tasks assessed    Lower Extremity Assessment Lower Extremity Assessment: Generalized weakness       Communication   Communication:  HOH  Cognition Arousal/Alertness: Awake/alert Behavior During Therapy: WFL for tasks assessed/performed Overall Cognitive Status: Within Functional Limits for tasks assessed                                 General Comments: WFL for simple tasks. HOH. Unsure if masking  dizziness/deficits      General Comments      Exercises     Assessment/Plan    PT Assessment Patient needs continued PT services  PT Problem List Decreased strength;Decreased activity tolerance;Decreased balance;Decreased mobility;Decreased knowledge of use of DME;Cardiopulmonary status limiting activity       PT Treatment Interventions DME instruction;Gait training;Stair training;Functional mobility training;Therapeutic activities;Therapeutic exercise;Balance training;Patient/family education    PT Goals (Current goals can be found in the Care Plan section)  Acute Rehab PT Goals Patient Stated Goal: Return home today PT Goal Formulation: With patient Time For Goal Achievement: 03/11/18 Potential to Achieve Goals: Good    Frequency Min 3X/week   Barriers to discharge        Co-evaluation               AM-PAC PT "6 Clicks" Mobility  Outcome Measure Help needed turning from your back to your side while in a flat bed without using bedrails?: None Help needed moving from lying on your back to sitting on the side of a flat bed without using bedrails?: None Help needed moving to and from a bed to a chair (including a wheelchair)?: A Little Help needed standing up from a chair using your arms (e.g., wheelchair or bedside chair)?: A Little Help needed to walk in hospital room?: A Little Help needed climbing 3-5 steps with a railing? : A Little 6 Click Score: 20    End of Session   Activity Tolerance: Treatment limited secondary to medical complications (Comment) Patient left: in bed;with call bell/phone within reach;with bed alarm set Nurse Communication: Mobility status PT Visit Diagnosis: Other abnormalities of gait and mobility (R26.89)    Time: 7915-0569 PT Time Calculation (min) (ACUTE ONLY): 17 min   Charges:   PT Evaluation $PT Eval Moderate Complexity: 1 Mod        Mabeline Caras, PT, DPT Acute Rehabilitation Services  Pager [phone] Office  [place] 02/25/2018, 4:07 PM

## 2018-02-25 NOTE — Procedures (Signed)
History: 72 yo M being evaluated for LOC  Sedation: None  Technique: This is a 21 channel routine scalp EEG performed at the bedside with bipolar and monopolar montages arranged in accordance to the international 10/20 system of electrode placement. One channel was dedicated to EKG recording.    Background: The background consists of intermixed alpha and beta activities. There is a well defined posterior dominant rhythm of 8-9 Hz that attenuates with eye opening. Sleep is recorded with normal appearing structures.   Photic stimulation: Physiologic driving is not performed  EEG Abnormalities: None  Clinical Interpretation: This normal EEG is recorded in the waking and sleep state. There was no seizure or seizure predisposition recorded on this study. Please note that lack of epileptiform activity on EEG does not preclude the possibility of epilepsy.   Roland Rack, MD [facility] [phone]  If 7pm- 7am, please page neurology on call as listed in [place].

## 2018-02-25 NOTE — Progress Notes (Signed)
Pt for discharge tonight. His pharmacy is closed for holiday. Depakote 500mg  one po q12 hours #60 nrf called into [facility], [phone]. Per RN, he has had tonight's dose.  KJKG, NP [facility]

## 2018-02-25 NOTE — Progress Notes (Signed)
Patient request eye drops for dry eye-  Paged MD.
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
## 2018-02-25 NOTE — Progress Notes (Signed)
Thigh high TED hoses on-  Provided education regarding need for them.

## 2018-02-27 LAB — GLUCOSE, CAPILLARY: Glucose-Capillary: 167 mg/dL — ABNORMAL HIGH (ref 70–99)

## 2018-03-17 ENCOUNTER — Ambulatory Visit (INDEPENDENT_AMBULATORY_CARE_PROVIDER_SITE_OTHER): Payer: Medicare Other | Admitting: Diagnostic Neuroimaging

## 2018-03-17 ENCOUNTER — Encounter: Payer: Self-pay | Admitting: Diagnostic Neuroimaging

## 2018-03-17 VITALS — BP 107/66 | HR 88 | Ht 72.0 in | Wt 181.0 lb

## 2018-03-17 DIAGNOSIS — F01518 Vascular dementia, unspecified severity, with other behavioral disturbance: Secondary | ICD-10-CM

## 2018-03-17 DIAGNOSIS — R269 Unspecified abnormalities of gait and mobility: Secondary | ICD-10-CM | POA: Diagnosis not present

## 2018-03-17 DIAGNOSIS — F0151 Vascular dementia with behavioral disturbance: Secondary | ICD-10-CM | POA: Diagnosis not present

## 2018-03-17 DIAGNOSIS — G903 Multi-system degeneration of the autonomic nervous system: Secondary | ICD-10-CM

## 2018-03-17 DIAGNOSIS — F0391 Unspecified dementia with behavioral disturbance: Secondary | ICD-10-CM | POA: Diagnosis not present

## 2018-03-17 DIAGNOSIS — F03B18 Unspecified dementia, moderate, with other behavioral disturbance: Secondary | ICD-10-CM
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
## 2018-04-03 ENCOUNTER — Emergency Department (HOSPITAL_COMMUNITY): Payer: Medicare Other

## 2018-04-03 ENCOUNTER — Other Ambulatory Visit: Payer: Self-pay

## 2018-04-03 ENCOUNTER — Inpatient Hospital Stay (HOSPITAL_COMMUNITY)
Admission: EM | Admit: 2018-04-03 | Discharge: 2018-04-14 | DRG: 092 | Disposition: A | Discharge status: Skilled Nursing Facility | Payer: Medicare Other | Attending: Internal Medicine | Admitting: Internal Medicine

## 2018-04-03 ENCOUNTER — Encounter (HOSPITAL_COMMUNITY): Payer: Self-pay | Admitting: *Deleted

## 2018-04-03 DIAGNOSIS — E11649 Type 2 diabetes mellitus with hypoglycemia without coma: Secondary | ICD-10-CM | POA: Diagnosis not present

## 2018-04-03 DIAGNOSIS — Z79899 Other long term (current) drug therapy: Secondary | ICD-10-CM

## 2018-04-03 DIAGNOSIS — E1151 Type 2 diabetes mellitus with diabetic peripheral angiopathy without gangrene: Secondary | ICD-10-CM | POA: Diagnosis present

## 2018-04-03 DIAGNOSIS — E1122 Type 2 diabetes mellitus with diabetic chronic kidney disease: Secondary | ICD-10-CM | POA: Diagnosis present

## 2018-04-03 DIAGNOSIS — I10 Essential (primary) hypertension: Secondary | ICD-10-CM | POA: Diagnosis present

## 2018-04-03 DIAGNOSIS — Z9181 History of falling: Secondary | ICD-10-CM

## 2018-04-03 DIAGNOSIS — I951 Orthostatic hypotension: Secondary | ICD-10-CM | POA: Diagnosis present

## 2018-04-03 DIAGNOSIS — G629 Polyneuropathy, unspecified: Secondary | ICD-10-CM | POA: Diagnosis present

## 2018-04-03 DIAGNOSIS — R42 Dizziness and giddiness: Secondary | ICD-10-CM | POA: Diagnosis not present

## 2018-04-03 DIAGNOSIS — N1832 Chronic kidney disease, stage 3b: Secondary | ICD-10-CM | POA: Diagnosis present

## 2018-04-03 DIAGNOSIS — I129 Hypertensive chronic kidney disease with stage 1 through stage 4 chronic kidney disease, or unspecified chronic kidney disease: Secondary | ICD-10-CM | POA: Diagnosis present

## 2018-04-03 DIAGNOSIS — R55 Syncope and collapse: Secondary | ICD-10-CM | POA: Diagnosis not present

## 2018-04-03 DIAGNOSIS — Z7982 Long term (current) use of aspirin: Secondary | ICD-10-CM

## 2018-04-03 DIAGNOSIS — E1165 Type 2 diabetes mellitus with hyperglycemia: Secondary | ICD-10-CM | POA: Diagnosis not present

## 2018-04-03 DIAGNOSIS — Z8249 Family history of ischemic heart disease and other diseases of the circulatory system: Secondary | ICD-10-CM

## 2018-04-03 DIAGNOSIS — N179 Acute kidney failure, unspecified: Secondary | ICD-10-CM | POA: Diagnosis not present

## 2018-04-03 DIAGNOSIS — Z833 Family history of diabetes mellitus: Secondary | ICD-10-CM

## 2018-04-03 DIAGNOSIS — F0390 Unspecified dementia without behavioral disturbance: Secondary | ICD-10-CM | POA: Diagnosis present

## 2018-04-03 DIAGNOSIS — R531 Weakness: Secondary | ICD-10-CM | POA: Diagnosis present

## 2018-04-03 DIAGNOSIS — D631 Anemia in chronic kidney disease: Secondary | ICD-10-CM | POA: Diagnosis not present

## 2018-04-03 DIAGNOSIS — S199XXA Unspecified injury of neck, initial encounter: Secondary | ICD-10-CM | POA: Diagnosis not present

## 2018-04-03 DIAGNOSIS — Z888 Allergy status to other drugs, medicaments and biological substances status: Secondary | ICD-10-CM

## 2018-04-03 DIAGNOSIS — G901 Familial dysautonomia [Riley-Day]: Secondary | ICD-10-CM | POA: Diagnosis not present

## 2018-04-03 DIAGNOSIS — Y9301 Activity, walking, marching and hiking: Secondary | ICD-10-CM | POA: Diagnosis present

## 2018-04-03 DIAGNOSIS — R4020 Unspecified coma: Secondary | ICD-10-CM

## 2018-04-03 DIAGNOSIS — Z7902 Long term (current) use of antithrombotics/antiplatelets: Secondary | ICD-10-CM

## 2018-04-03 DIAGNOSIS — S299XXA Unspecified injury of thorax, initial encounter: Secondary | ICD-10-CM | POA: Diagnosis not present

## 2018-04-03 DIAGNOSIS — F329 Major depressive disorder, single episode, unspecified: Secondary | ICD-10-CM | POA: Diagnosis present

## 2018-04-03 DIAGNOSIS — Z9842 Cataract extraction status, left eye: Secondary | ICD-10-CM

## 2018-04-03 DIAGNOSIS — R0902 Hypoxemia: Secondary | ICD-10-CM | POA: Diagnosis not present

## 2018-04-03 DIAGNOSIS — E876 Hypokalemia: Secondary | ICD-10-CM

## 2018-04-03 DIAGNOSIS — Y92013 Bedroom of single-family (private) house as the place of occurrence of the external cause: Secondary | ICD-10-CM

## 2018-04-03 DIAGNOSIS — W19XXXA Unspecified fall, initial encounter: Secondary | ICD-10-CM

## 2018-04-03 DIAGNOSIS — Z7952 Long term (current) use of systemic steroids: Secondary | ICD-10-CM

## 2018-04-03 DIAGNOSIS — N183 Chronic kidney disease, stage 3 unspecified: Secondary | ICD-10-CM | POA: Diagnosis present

## 2018-04-03 DIAGNOSIS — R296 Repeated falls: Secondary | ICD-10-CM | POA: Diagnosis present

## 2018-04-03 DIAGNOSIS — R1012 Left upper quadrant pain: Secondary | ICD-10-CM | POA: Diagnosis present

## 2018-04-03 DIAGNOSIS — Z9841 Cataract extraction status, right eye: Secondary | ICD-10-CM

## 2018-04-03 DIAGNOSIS — Z8673 Personal history of transient ischemic attack (TIA), and cerebral infarction without residual deficits: Secondary | ICD-10-CM

## 2018-04-03 DIAGNOSIS — R0781 Pleurodynia: Secondary | ICD-10-CM | POA: Diagnosis present

## 2018-04-03 DIAGNOSIS — H02402 Unspecified ptosis of left eyelid: Secondary | ICD-10-CM | POA: Diagnosis present

## 2018-04-03 DIAGNOSIS — S0990XA Unspecified injury of head, initial encounter: Secondary | ICD-10-CM | POA: Diagnosis not present

## 2018-04-03 DIAGNOSIS — E785 Hyperlipidemia, unspecified: Secondary | ICD-10-CM | POA: Diagnosis present

## 2018-04-03 DIAGNOSIS — D649 Anemia, unspecified: Secondary | ICD-10-CM | POA: Diagnosis present

## 2018-04-03 DIAGNOSIS — W010XXA Fall on same level from slipping, tripping and stumbling without subsequent striking against object, initial encounter: Secondary | ICD-10-CM | POA: Diagnosis present

## 2018-04-03 LAB — CBC WITH DIFFERENTIAL/PLATELET
Abs Immature Granulocytes: 0.03 10*3/uL (ref 0.00–0.07)
Basophils Absolute: 0 10*3/uL (ref 0.0–0.1)
Basophils Relative: 1 %
Eosinophils Absolute: 0.2 10*3/uL (ref 0.0–0.5)
Eosinophils Relative: 4 %
HCT: 33 % — ABNORMAL LOW (ref 39.0–52.0)
Hemoglobin: 10.3 g/dL — ABNORMAL LOW (ref 13.0–17.0)
Immature Granulocytes: 1 %
Lymphocytes Relative: 28 %
Lymphs Abs: 1.5 10*3/uL (ref 0.7–4.0)
MCH: 28.9 pg (ref 26.0–34.0)
MCHC: 31.2 g/dL (ref 30.0–36.0)
MCV: 92.4 fL (ref 80.0–100.0)
Monocytes Absolute: 0.7 10*3/uL (ref 0.1–1.0)
Monocytes Relative: 13 %
Neutro Abs: 2.9 10*3/uL (ref 1.7–7.7)
Neutrophils Relative %: 53 %
Platelets: 225 10*3/uL (ref 150–400)
RBC: 3.57 MIL/uL — ABNORMAL LOW (ref 4.22–5.81)
RDW: 14.7 % (ref 11.5–15.5)
WBC: 5.3 10*3/uL (ref 4.0–10.5)
nRBC: 0 % (ref 0.0–0.2)

## 2018-04-03 LAB — COMPREHENSIVE METABOLIC PANEL
ALT: 14 U/L (ref 0–44)
AST: 17 U/L (ref 15–41)
Albumin: 3.5 g/dL (ref 3.5–5.0)
Alkaline Phosphatase: 54 U/L (ref 38–126)
Anion gap: 13 (ref 5–15)
BUN: 26 mg/dL — ABNORMAL HIGH (ref 8–23)
CO2: 27 mmol/L (ref 22–32)
Calcium: 8.8 mg/dL — ABNORMAL LOW (ref 8.9–10.3)
Chloride: 97 mmol/L — ABNORMAL LOW (ref 98–111)
Creatinine, Ser: 2.1 mg/dL — ABNORMAL HIGH (ref 0.61–1.24)
GFR calc Af Amer: 36 mL/min — ABNORMAL LOW (ref >60)
GFR calc non Af Amer: 31 mL/min — ABNORMAL LOW (ref >60)
Glucose, Bld: 177 mg/dL — ABNORMAL HIGH (ref 70–99)
Potassium: 2.8 mmol/L — ABNORMAL LOW (ref 3.5–5.1)
Sodium: 137 mmol/L (ref 135–145)
Total Bilirubin: 0.7 mg/dL (ref 0.3–1.2)
Total Protein: 7.3 g/dL (ref 6.5–8.1)

## 2018-04-03 MED ORDER — POTASSIUM CHLORIDE CRYS ER 20 MEQ PO TBCR
40.0000 meq | EXTENDED_RELEASE_TABLET | Freq: Once | ORAL | Status: AC
  Administered 2018-04-03: 40 meq via ORAL
  Filled 2018-04-03: qty 2

## 2018-04-03 MED ORDER — POTASSIUM CHLORIDE 10 MEQ/100ML IV SOLN
10.0000 meq | INTRAVENOUS | Status: DC
  Administered 2018-04-03: 10 meq via INTRAVENOUS
  Filled 2018-04-03: qty 100

## 2018-04-03 MED ORDER — ENOXAPARIN SODIUM 40 MG/0.4ML ~~LOC~~ SOLN
40.0000 mg | Freq: Every day | SUBCUTANEOUS | Status: DC
  Administered 2018-04-04 – 2018-04-14 (×11): 40 mg via SUBCUTANEOUS
  Filled 2018-04-03 (×11): qty 0.4

## 2018-04-03 MED ORDER — ACETAMINOPHEN 325 MG PO TABS
650.0000 mg | ORAL_TABLET | Freq: Four times a day (QID) | ORAL | Status: DC | PRN
  Administered 2018-04-05: 650 mg via ORAL
  Filled 2018-04-03: qty 2

## 2018-04-03 MED ORDER — POTASSIUM CHLORIDE CRYS ER 20 MEQ PO TBCR
40.0000 meq | EXTENDED_RELEASE_TABLET | Freq: Once | ORAL | Status: AC
  Administered 2018-04-04: 40 meq via ORAL
  Filled 2018-04-03: qty 2

## 2018-04-03 MED ORDER — ACETAMINOPHEN 650 MG RE SUPP: 650.0000 mg | Freq: Four times a day (QID) | RECTAL | Status: DC | PRN

## 2018-04-03 MED ORDER — SODIUM CHLORIDE 0.9 % IV SOLN
INTRAVENOUS | Status: AC
  Administered 2018-04-04: 02:00:00 via INTRAVENOUS

## 2018-04-03 MED ORDER — LACTATED RINGERS IV BOLUS
500.0000 mL | Freq: Once | INTRAVENOUS | Status: AC
  Administered 2018-04-03: 500 mL via INTRAVENOUS

## 2018-04-03 NOTE — ED Triage Notes (Signed)
Pt reports he has been getting dizzy and passing out for about 3 weeks. Happens usually when he stands up.  He reports he has been evaluated for the same recently. He says he was getting up to go to the bathroom tonight, started getting dizzy and fell. No recollection of hitting his head. Said he "passed out then came right back." Some pain to the left ribs, thinks he may have struck the chest of drawers when falling. No SOB.

## 2018-04-03 NOTE — ED Notes (Signed)
Patient transported to CT 

## 2018-04-03 NOTE — ED Triage Notes (Signed)
Pt arrives from home via [facility]. Per their report, the pt was sitting on bed, stood up, felt lightheaded and fell down. C/o left sided rib pain, possible deformity to the area. cbg 268, 193/100, hr 86, pain 2/10 worse with mov't and palpation.
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
## 2018-04-03 NOTE — ED Notes (Signed)
ED Provider at bedside. 

## 2018-04-03 NOTE — ED Notes (Signed)
Sandwich given 

## 2018-04-04 DIAGNOSIS — E785 Hyperlipidemia, unspecified: Secondary | ICD-10-CM | POA: Diagnosis present

## 2018-04-04 DIAGNOSIS — F329 Major depressive disorder, single episode, unspecified: Secondary | ICD-10-CM | POA: Diagnosis present

## 2018-04-04 DIAGNOSIS — W010XXA Fall on same level from slipping, tripping and stumbling without subsequent striking against object, initial encounter: Secondary | ICD-10-CM | POA: Diagnosis present

## 2018-04-04 DIAGNOSIS — F0390 Unspecified dementia without behavioral disturbance: Secondary | ICD-10-CM | POA: Diagnosis present

## 2018-04-04 DIAGNOSIS — I10 Essential (primary) hypertension: Secondary | ICD-10-CM | POA: Diagnosis not present

## 2018-04-04 DIAGNOSIS — E876 Hypokalemia: Secondary | ICD-10-CM | POA: Diagnosis not present

## 2018-04-04 DIAGNOSIS — Z79899 Other long term (current) drug therapy: Secondary | ICD-10-CM | POA: Diagnosis not present

## 2018-04-04 DIAGNOSIS — E1151 Type 2 diabetes mellitus with diabetic peripheral angiopathy without gangrene: Secondary | ICD-10-CM | POA: Diagnosis present

## 2018-04-04 DIAGNOSIS — W19XXXD Unspecified fall, subsequent encounter: Secondary | ICD-10-CM | POA: Diagnosis not present

## 2018-04-04 DIAGNOSIS — I129 Hypertensive chronic kidney disease with stage 1 through stage 4 chronic kidney disease, or unspecified chronic kidney disease: Secondary | ICD-10-CM | POA: Diagnosis present

## 2018-04-04 DIAGNOSIS — E11649 Type 2 diabetes mellitus with hypoglycemia without coma: Secondary | ICD-10-CM | POA: Diagnosis not present

## 2018-04-04 DIAGNOSIS — N183 Chronic kidney disease, stage 3 (moderate): Secondary | ICD-10-CM

## 2018-04-04 DIAGNOSIS — G629 Polyneuropathy, unspecified: Secondary | ICD-10-CM | POA: Diagnosis present

## 2018-04-04 DIAGNOSIS — I951 Orthostatic hypotension: Secondary | ICD-10-CM | POA: Diagnosis not present

## 2018-04-04 DIAGNOSIS — F339 Major depressive disorder, recurrent, unspecified: Secondary | ICD-10-CM | POA: Diagnosis not present

## 2018-04-04 DIAGNOSIS — W19XXXA Unspecified fall, initial encounter: Secondary | ICD-10-CM | POA: Diagnosis not present

## 2018-04-04 DIAGNOSIS — R410 Disorientation, unspecified: Secondary | ICD-10-CM | POA: Diagnosis not present

## 2018-04-04 DIAGNOSIS — M4800 Spinal stenosis, site unspecified: Secondary | ICD-10-CM | POA: Diagnosis not present

## 2018-04-04 DIAGNOSIS — D631 Anemia in chronic kidney disease: Secondary | ICD-10-CM | POA: Diagnosis present

## 2018-04-04 DIAGNOSIS — M2550 Pain in unspecified joint: Secondary | ICD-10-CM | POA: Diagnosis not present

## 2018-04-04 DIAGNOSIS — Y9301 Activity, walking, marching and hiking: Secondary | ICD-10-CM | POA: Diagnosis present

## 2018-04-04 DIAGNOSIS — I69959 Hemiplegia and hemiparesis following unspecified cerebrovascular disease affecting unspecified side: Secondary | ICD-10-CM | POA: Diagnosis not present

## 2018-04-04 DIAGNOSIS — T383X5A Adverse effect of insulin and oral hypoglycemic [antidiabetic] drugs, initial encounter: Secondary | ICD-10-CM | POA: Diagnosis not present

## 2018-04-04 DIAGNOSIS — Y92013 Bedroom of single-family (private) house as the place of occurrence of the external cause: Secondary | ICD-10-CM | POA: Diagnosis not present

## 2018-04-04 DIAGNOSIS — E160 Drug-induced hypoglycemia without coma: Secondary | ICD-10-CM | POA: Diagnosis not present

## 2018-04-04 DIAGNOSIS — G3183 Dementia with Lewy bodies: Secondary | ICD-10-CM | POA: Diagnosis not present

## 2018-04-04 DIAGNOSIS — Z8673 Personal history of transient ischemic attack (TIA), and cerebral infarction without residual deficits: Secondary | ICD-10-CM | POA: Diagnosis not present

## 2018-04-04 DIAGNOSIS — I639 Cerebral infarction, unspecified: Secondary | ICD-10-CM | POA: Diagnosis not present

## 2018-04-04 DIAGNOSIS — H02402 Unspecified ptosis of left eyelid: Secondary | ICD-10-CM | POA: Diagnosis present

## 2018-04-04 DIAGNOSIS — R0781 Pleurodynia: Secondary | ICD-10-CM | POA: Diagnosis present

## 2018-04-04 DIAGNOSIS — R1012 Left upper quadrant pain: Secondary | ICD-10-CM | POA: Diagnosis present

## 2018-04-04 DIAGNOSIS — R531 Weakness: Secondary | ICD-10-CM | POA: Diagnosis present

## 2018-04-04 DIAGNOSIS — F0280 Dementia in other diseases classified elsewhere without behavioral disturbance: Secondary | ICD-10-CM | POA: Diagnosis not present

## 2018-04-04 DIAGNOSIS — Z7401 Bed confinement status: Secondary | ICD-10-CM | POA: Diagnosis not present

## 2018-04-04 DIAGNOSIS — W3400XD Accidental discharge from unspecified firearms or gun, subsequent encounter: Secondary | ICD-10-CM | POA: Diagnosis not present

## 2018-04-04 DIAGNOSIS — D649 Anemia, unspecified: Secondary | ICD-10-CM | POA: Diagnosis present

## 2018-04-04 DIAGNOSIS — M6281 Muscle weakness (generalized): Secondary | ICD-10-CM | POA: Diagnosis not present

## 2018-04-04 DIAGNOSIS — G901 Familial dysautonomia [Riley-Day]: Secondary | ICD-10-CM | POA: Diagnosis present

## 2018-04-04 DIAGNOSIS — I739 Peripheral vascular disease, unspecified: Secondary | ICD-10-CM | POA: Diagnosis not present

## 2018-04-04 DIAGNOSIS — R296 Repeated falls: Secondary | ICD-10-CM | POA: Diagnosis present

## 2018-04-04 DIAGNOSIS — E1122 Type 2 diabetes mellitus with diabetic chronic kidney disease: Secondary | ICD-10-CM | POA: Diagnosis present

## 2018-04-04 DIAGNOSIS — E278 Other specified disorders of adrenal gland: Secondary | ICD-10-CM | POA: Diagnosis not present

## 2018-04-04 DIAGNOSIS — N179 Acute kidney failure, unspecified: Secondary | ICD-10-CM | POA: Diagnosis present

## 2018-04-04 DIAGNOSIS — Z9862 Peripheral vascular angioplasty status: Secondary | ICD-10-CM | POA: Diagnosis not present

## 2018-04-04 LAB — COMPREHENSIVE METABOLIC PANEL
ALT: 11 U/L (ref 0–44)
AST: 14 U/L — ABNORMAL LOW (ref 15–41)
Albumin: 3 g/dL — ABNORMAL LOW (ref 3.5–5.0)
Alkaline Phosphatase: 49 U/L (ref 38–126)
Anion gap: 9 (ref 5–15)
BUN: 22 mg/dL (ref 8–23)
CO2: 24 mmol/L (ref 22–32)
Calcium: 8.3 mg/dL — ABNORMAL LOW (ref 8.9–10.3)
Chloride: 108 mmol/L (ref 98–111)
Creatinine, Ser: 1.56 mg/dL — ABNORMAL HIGH (ref 0.61–1.24)
GFR calc Af Amer: 51 mL/min — ABNORMAL LOW (ref >60)
GFR calc non Af Amer: 44 mL/min — ABNORMAL LOW (ref >60)
Glucose, Bld: 190 mg/dL — ABNORMAL HIGH (ref 70–99)
Potassium: 3.3 mmol/L — ABNORMAL LOW (ref 3.5–5.1)
Sodium: 141 mmol/L (ref 135–145)
Total Bilirubin: 0.9 mg/dL (ref 0.3–1.2)
Total Protein: 6.3 g/dL — ABNORMAL LOW (ref 6.5–8.1)

## 2018-04-04 LAB — GLUCOSE, CAPILLARY
Glucose-Capillary: 243 mg/dL — ABNORMAL HIGH (ref 70–99)
Glucose-Capillary: 182 mg/dL — ABNORMAL HIGH (ref 70–99)
Glucose-Capillary: 193 mg/dL — ABNORMAL HIGH (ref 70–99)
Glucose-Capillary: 172 mg/dL — ABNORMAL HIGH (ref 70–99)
Glucose-Capillary: 80 mg/dL (ref 70–99)
Glucose-Capillary: 102 mg/dL — ABNORMAL HIGH (ref 70–99)

## 2018-04-04 LAB — CBC WITH DIFFERENTIAL/PLATELET
Abs Immature Granulocytes: 0.01 10*3/uL (ref 0.00–0.07)
Basophils Absolute: 0 10*3/uL (ref 0.0–0.1)
Basophils Relative: 1 %
Eosinophils Absolute: 0.1 10*3/uL (ref 0.0–0.5)
Eosinophils Relative: 3 %
HCT: 28.1 % — ABNORMAL LOW (ref 39.0–52.0)
Hemoglobin: 9.5 g/dL — ABNORMAL LOW (ref 13.0–17.0)
Immature Granulocytes: 0 %
Lymphocytes Relative: 32 %
Lymphs Abs: 1.5 10*3/uL (ref 0.7–4.0)
MCH: 30.3 pg (ref 26.0–34.0)
MCHC: 33.8 g/dL (ref 30.0–36.0)
MCV: 89.5 fL (ref 80.0–100.0)
Monocytes Absolute: 0.6 10*3/uL (ref 0.1–1.0)
Monocytes Relative: 13 %
Neutro Abs: 2.4 10*3/uL (ref 1.7–7.7)
Neutrophils Relative %: 51 %
Platelets: 187 10*3/uL (ref 150–400)
RBC: 3.14 MIL/uL — ABNORMAL LOW (ref 4.22–5.81)
RDW: 14.7 % (ref 11.5–15.5)
WBC: 4.7 10*3/uL (ref 4.0–10.5)
nRBC: 0 % (ref 0.0–0.2)

## 2018-04-04 LAB — BASIC METABOLIC PANEL
Anion gap: 8 (ref 5–15)
BUN: 24 mg/dL — ABNORMAL HIGH (ref 8–23)
CO2: 26 mmol/L (ref 22–32)
Calcium: 8.2 mg/dL — ABNORMAL LOW (ref 8.9–10.3)
Chloride: 108 mmol/L (ref 98–111)
Creatinine, Ser: 1.77 mg/dL — ABNORMAL HIGH (ref 0.61–1.24)
GFR calc Af Amer: 44 mL/min — ABNORMAL LOW (ref >60)
GFR calc non Af Amer: 38 mL/min — ABNORMAL LOW (ref >60)
Glucose, Bld: 251 mg/dL — ABNORMAL HIGH (ref 70–99)
Potassium: 3.2 mmol/L — ABNORMAL LOW (ref 3.5–5.1)
Sodium: 142 mmol/L (ref 135–145)

## 2018-04-04 LAB — MAGNESIUM
Magnesium: 1.6 mg/dL — ABNORMAL LOW (ref 1.7–2.4)
Magnesium: 1.5 mg/dL — ABNORMAL LOW (ref 1.7–2.4)

## 2018-04-04 LAB — HEMOGLOBIN A1C
Hgb A1c MFr Bld: 7.1 % — ABNORMAL HIGH (ref 4.8–5.6)
Mean Plasma Glucose: 157.07 mg/dL

## 2018-04-04 LAB — PHOSPHORUS: Phosphorus: 2.4 mg/dL — ABNORMAL LOW (ref 2.5–4.6)

## 2018-04-04 LAB — TROPONIN I: Troponin I: <0.03 ng/mL (ref <0.03)

## 2018-04-04 MED ORDER — INSULIN GLARGINE 100 UNIT/ML ~~LOC~~ SOLN
30.0000 [IU] | Freq: Every day | SUBCUTANEOUS | Status: DC
  Administered 2018-04-04 (×2): 30 [IU] via SUBCUTANEOUS
  Filled 2018-04-04 (×2): qty 0.3

## 2018-04-04 MED ORDER — CLOPIDOGREL BISULFATE 75 MG PO TABS
75.0000 mg | ORAL_TABLET | Freq: Every day | ORAL | Status: DC
  Administered 2018-04-04 – 2018-04-14 (×11): 75 mg via ORAL
  Filled 2018-04-04 (×11): qty 1

## 2018-04-04 MED ORDER — SODIUM CHLORIDE 0.9 % IV SOLN: INTRAVENOUS | Status: AC

## 2018-04-04 MED ORDER — HYDRALAZINE HCL 20 MG/ML IJ SOLN
5.0000 mg | INTRAMUSCULAR | Status: DC | PRN
  Administered 2018-04-04 – 2018-04-05 (×4): 5 mg via INTRAVENOUS
  Filled 2018-04-04 (×4): qty 1

## 2018-04-04 MED ORDER — FLUDROCORTISONE ACETATE 0.1 MG PO TABS
0.1000 mg | ORAL_TABLET | Freq: Every day | ORAL | Status: DC
  Administered 2018-04-04 – 2018-04-12 (×9): 0.1 mg via ORAL
  Filled 2018-04-04 (×9): qty 1

## 2018-04-04 MED ORDER — INSULIN ASPART 100 UNIT/ML ~~LOC~~ SOLN
0.0000 [IU] | Freq: Three times a day (TID) | SUBCUTANEOUS | Status: DC
  Administered 2018-04-04 – 2018-04-05 (×3): 2 [IU] via SUBCUTANEOUS
  Administered 2018-04-05 – 2018-04-07 (×2): 1 [IU] via SUBCUTANEOUS
  Administered 2018-04-08: 2 [IU] via SUBCUTANEOUS
  Administered 2018-04-08: 1 [IU] via SUBCUTANEOUS
  Administered 2018-04-09: 2 [IU] via SUBCUTANEOUS
  Administered 2018-04-09: 9 [IU] via SUBCUTANEOUS
  Administered 2018-04-10: 1 [IU] via SUBCUTANEOUS
  Administered 2018-04-10 (×2): 2 [IU] via SUBCUTANEOUS
  Administered 2018-04-11 – 2018-04-12 (×2): 1 [IU] via SUBCUTANEOUS
  Administered 2018-04-12 – 2018-04-14 (×4): 2 [IU] via SUBCUTANEOUS

## 2018-04-04 MED ORDER — POLYVINYL ALCOHOL 1.4 % OP SOLN
1.0000 [drp] | Freq: Two times a day (BID) | OPHTHALMIC | Status: DC | PRN
  Administered 2018-04-07 – 2018-04-13 (×6): 1 [drp] via OPHTHALMIC
  Filled 2018-04-04 (×2): qty 15

## 2018-04-04 MED ORDER — SODIUM CHLORIDE 0.9 % IV SOLN: INTRAVENOUS | Status: DC

## 2018-04-04 MED ORDER — K PHOS MONO-SOD PHOS DI & MONO 155-852-130 MG PO TABS
500.0000 mg | ORAL_TABLET | Freq: Two times a day (BID) | ORAL | Status: AC
  Administered 2018-04-04 (×2): 500 mg via ORAL
  Filled 2018-04-04 (×2): qty 2

## 2018-04-04 MED ORDER — ROSUVASTATIN CALCIUM 5 MG PO TABS
5.0000 mg | ORAL_TABLET | Freq: Every day | ORAL | Status: DC
  Administered 2018-04-04 – 2018-04-14 (×11): 5 mg via ORAL
  Filled 2018-04-04 (×11): qty 1

## 2018-04-04 MED ORDER — ASPIRIN EC 81 MG PO TBEC
81.0000 mg | DELAYED_RELEASE_TABLET | Freq: Every day | ORAL | Status: DC
  Administered 2018-04-04 – 2018-04-14 (×11): 81 mg via ORAL
  Filled 2018-04-04 (×11): qty 1

## 2018-04-04 MED ORDER — POTASSIUM CHLORIDE CRYS ER 20 MEQ PO TBCR
40.0000 meq | EXTENDED_RELEASE_TABLET | Freq: Once | ORAL | Status: AC
  Administered 2018-04-04: 40 meq via ORAL
  Filled 2018-04-04: qty 2

## 2018-04-04 MED ORDER — SERTRALINE HCL 25 MG PO TABS
25.0000 mg | ORAL_TABLET | Freq: Every day | ORAL | Status: DC
  Administered 2018-04-04 – 2018-04-14 (×11): 25 mg via ORAL
  Filled 2018-04-04 (×11): qty 1

## 2018-04-04 MED ORDER — MAGNESIUM SULFATE 2 GM/50ML IV SOLN
2.0000 g | Freq: Once | INTRAVENOUS | Status: AC
  Administered 2018-04-04: 2 g via INTRAVENOUS
  Filled 2018-04-04: qty 50

## 2018-04-04 NOTE — Progress Notes (Signed)
PROGRESS NOTE    Harry [name]  PJA:[id number] DOB: 06-05-1945 DOA: 04/03/2018 PCP: [name], MD   Brief Narrative:  HPI per Dr. Shela Leff on 04/03/2018 Harry [name] is a 73 y.o. male with medical history significant of CKD 3, hypertension, hyperlipidemia, PVD status post atherectomy, type 2 diabetes, CVA presenting to the hospital via EMS for evaluation of fall and rib pain.  Per son at bedside, patient has a history of recurrent falls secondary to his blood pressure dropping.  States patient was walking this morning lost his balance and fell.  Again this evening he fell while walking and has been complaining of left-sided rib pain since then.  Patient states he has been dizzy for the past 3 weeks.  Denies having any chest pain or shortness of breath prior to the falls.  Family states patient did not lose consciousness.  They did not notice any seizure-like activity today.   **Was again Orthostatic again today. PT evaluated and recommending SNF; Will continue IVF and place TED hose  Assessment & Plan:   Principal Problem:   Orthostatic hypotension Active Problems:   Essential hypertension   CKD (chronic kidney disease), stage III (HCC)   Falls   Hypokalemia  Recurrent Falls, Orthostatic hypotension -Orthostatics positive in the ED. -Infectious etiology less likely as patient has afebrile and has no leukocytosis.  Chest x-ray not suggestive of pneumonia. -Head CT and CT C-spine negative for acute abnormality. -May Consider obtaining MRI if still dizzy and Orthostasis is improved -Patient denies having any chest pain.  EKG without acute changes. Checked Troponin and was <0.03 -Cardiac monitoring -IV fluid hydration -Repeat orthostatics in the morning showed that patient dropped again  -Will try TED Hose -Continue home fludrocortisone 0.1 mg po Daily -PT evaluation recommending SNF  Left-Sided rib pain secondary to fall -Chest x-ray without evidence of fracture  "as the visualized skeletal structures are unremarkable." -C/w Acetaminophen 650 mg po q6hprn Mild Pain  Hypertension -Blood pressure remained elevated but is now 114/63 this Afternoon -C/w 5 mg of Hydralazine q4hprn SBP >180  Normocytic Anemia/Anemia of Chronic Kidney Disease -Stable.  Hemoglobin 10.3, at recent baseline. -Hb/Hct is now 9.5/28.1 -Continue to Monitor for S/Sx of Bleeding  -Repeat CBC in AM   AKI on CKD 3 -Creatinine 2.1 on Admission, recent baseline 1.8-2.0. -IV fluid hydration with NS at 150 mL/hr x12 now stopped and will decrease to 75 mL/hr x1 Day -Avoid nephrotoxic agents/contrast and avoid Hypotension -BUN/Cr is now improved and is 22/1.56 -Continue to monitor renal function   Peripheral Vascular Disease/PAD -Continue home aspirin 81 mg po Daily and Clopidogrel 75 mg po Daily   Hyperlipidemia -Continue home Rosuvastatin 5 mg po Daily   Depression -Continue home Sertraline 25 mg po Daily   Type 2 Diabetes Mellitus  -CBG 177. -Checked A1c and was 7.1 -Continue home Basaglar 30 units at bedtime -Placed on Sensitive Novolog SSI AC -Continue to Monitor CBG's; CBG's ranging from 172-243  Hypokalemia -Patient's K+ was 3.3 -Not on home diuretic.  Patient is not complaining of vomiting or diarrhea. -Replete with po Potassium Chloride 40 mEQ x1 -Continue to Monitor and Replete as Necessary -Repeat Mag Level in AM   Hypophosphatemia -Patient's Phos Level was 2.4 -Replete with po K Phos Neutral 500 mg x1 -Continue to Monitor and Replete as Necessary -Repeat Phos Level in AM   Hypomagnesemia -Patient's Mag Level was 1.5 -Replete with IV Mag Sulfate 2 grams -Continue to Monitor and Replete as Necessary -Repeat  Mag Level in AM   DVT prophylaxis: Enoxaparin 40 mg sq Daily  Code Status: FULL CODE Family Communication: No family present at bedside Disposition Plan: SNF if Agreeable  Consultants:   None   Procedures:  None   Antimicrobials:  Anti-infectives (From admission, onward)   None     Subjective: Seen and examined at bedside and was resting and wanting to sleep.  Was complaining of left-sided rib pain where he fell.  Wanted to know when he could go home.  No nausea or vomiting.  Still felt dizzy upon standing this morning.  No other concerns or complaints at this time.  Objective: Vitals:   04/04/18 0300 04/04/18 0603 04/04/18 0902 04/04/18 1303  BP: (!) 171/82 (!) 149/86 (!) 183/101 114/63  Pulse:  84 81 88  Resp:  16  16  Temp:  98.8 F (37.1 C)  97.6 F (36.4 C)  TempSrc:  Oral  Oral  SpO2:  100%  100%  Weight:      Height:        Intake/Output Summary (Last 24 hours) at 04/04/2018 1827 Last data filed at 04/04/2018 1700 Gross per 24 hour  Intake 3058.96 ml  Output 1660 ml  Net 1398.96 ml   Filed Weights   04/03/18 2004  Weight: 79.4 kg   Examination: Physical Exam:  Constitutional: WN/WD slightly overweight AAM in NAD and appears calm and comfortable Eyes: Lids and conjunctivae normal, sclerae anicteric  ENMT: External Ears, Nose appear normal. Grossly normal hearing. Neck: Appears normal, supple, no cervical masses, normal ROM, no appreciable thyromegaly; no JVD Respiratory: Diminished to auscultation bilaterally, no wheezing, rales, rhonchi or crackles. Normal respiratory effort and patient is not tachypenic. No accessory muscle use.  Cardiovascular: RRR, no murmurs / rubs / gallops. S1 and S2 auscultated. No extremity edema.  Abdomen: Soft, non-tender, mildly distended due to body habitus. No masses palpated. No appreciable hepatosplenomegaly. Bowel sounds positive x4.  GU: Deferred. Musculoskeletal: No clubbing / cyanosis of digits/nails. No joint deformity upper and lower extremities.   Skin: No rashes, lesions, ulcers on a limited skin evaluation. No induration; Warm and dry.  Neurologic: CN 2-12 grossly intact with no focal deficits.  Romberg sign and cerebellar  reflexes not assessed.  Psychiatric: Normal judgment and insight. Alert and oriented x 3. Normal mood and appropriate affect.   Data Reviewed: I have personally reviewed following labs and imaging studies  CBC: Recent Labs  Lab 04/03/18 2048 04/04/18 0935  WBC 5.3 4.7  NEUTROABS 2.9 2.4  HGB 10.3* 9.5*  HCT 33.0* 28.1*  MCV 92.4 89.5  PLT 225 025   Basic Metabolic Panel: Recent Labs  Lab 04/03/18 2048 04/04/18 0036 04/04/18 0356 04/04/18 0935  NA 137  --  142 141  K 2.8*  --  3.2* 3.3*  CL 97*  --  108 108  CO2 27  --  26 24  GLUCOSE 177*  --  251* 190*  BUN 26*  --  24* 22  CREATININE 2.10*  --  1.77* 1.56*  CALCIUM 8.8*  --  8.2* 8.3*  MG  --  1.6*  --  1.5*  PHOS  --   --   --  2.4*   GFR: Estimated Creatinine Clearance: 43.4 mL/min (A) (by C-G formula based on SCr of 1.56 mg/dL (H)). Liver Function Tests: Recent Labs  Lab 04/03/18 2048 04/04/18 0935  AST 17 14*  ALT 14 11  ALKPHOS 54 49  BILITOT 0.7 0.9  PROT 7.3  6.3*  ALBUMIN 3.5 3.0*   No results for input(s): LIPASE, AMYLASE in the last 168 hours. No results for input(s): AMMONIA in the last 168 hours. Coagulation Profile: No results for input(s): INR, PROTIME in the last 168 hours. Cardiac Enzymes: Recent Labs  Lab 04/04/18 0036  TROPONINI <0.03   BNP (last 3 results) No results for input(s): PROBNP in the last 8760 hours. HbA1C: Recent Labs    04/04/18 0356  HGBA1C 7.1*   CBG: Recent Labs  Lab 04/04/18 0222 04/04/18 0558 04/04/18 0856 04/04/18 1200  GLUCAP 243* 182* 193* 172*   Lipid Profile: No results for input(s): CHOL, HDL, LDLCALC, TRIG, CHOLHDL, LDLDIRECT in the last 72 hours. Thyroid Function Tests: No results for input(s): TSH, T4TOTAL, FREET4, T3FREE, THYROIDAB in the last 72 hours. Anemia Panel: No results for input(s): VITAMINB12, FOLATE, FERRITIN, TIBC, IRON, RETICCTPCT in the last 72 hours. Sepsis Labs: No results for input(s): PROCALCITON, LATICACIDVEN in the  last 168 hours.  No results found for this or any previous visit (from the past 240 hour(s)).    Radiology Studies: Dg Chest 2 View  Result Date: 04/03/2018 CLINICAL DATA:  Patient fell today after standing up from bed. Dizziness. EXAM: CHEST - 2 VIEW COMPARISON:  November 15, 2017 FINDINGS: The heart size and mediastinal contours are within normal limits. Both lungs are clear. The visualized skeletal structures are unremarkable. IMPRESSION: No active cardiopulmonary disease. Electronically Signed   By: Dorise Bullion III M.D   On: 04/03/2018 21:41   Ct Head Wo Contrast  Result Date: 04/03/2018 CLINICAL DATA:  Initial evaluation for acute dizziness, fall. EXAM: CT HEAD WITHOUT CONTRAST CT CERVICAL SPINE WITHOUT CONTRAST TECHNIQUE: Multidetector CT imaging of the head and cervical spine was performed following the standard protocol without intravenous contrast. Multiplanar CT image reconstructions of the cervical spine were also generated. COMPARISON:  Prior CT from 02/25/2018 FINDINGS: CT HEAD FINDINGS Brain: Age-related cerebral atrophy with chronic microvascular ischemic disease. Remote left PCA territory infarct involving the left occipital lobe. Additional smaller remote right PCA territory infarct involving the right occipital lobe. Chronic bilateral basal ganglia and thalamic lacunar infarcts. Bilateral cerebellar infarcts, left greater than right. Appearance is stable from previous. No acute intracranial hemorrhage. No acute large vessel territory infarct. No mass lesion, midline shift or mass effect. No hydrocephalus. No extra-axial fluid collection. Vascular: No hyperdense vessel. Scattered vascular calcifications noted within the carotid siphons. Skull: Scalp soft tissues and calvarium within normal limits. Sinuses/Orbits: Globes and orbital soft tissues normal. Paranasal sinuses and mastoid air cells are clear. Other: None. CT CERVICAL SPINE FINDINGS Alignment: Straightening of the normal  cervical lordosis. No listhesis or malalignment. Skull base and vertebrae: Skull base intact. Normal C1-2 articulations are preserved in the dens is intact. Vertebral body heights maintained. No acute fracture. Tiny osseous density adjacent to the superior articular process of the right C3 facet appears chronic in nature, likely degenerative. Soft tissues and spinal canal: Soft tissues of the neck demonstrate no acute finding. No abnormal prevertebral edema. Spinal canal within normal limits. Vascular calcifications about the carotid bifurcations. Disc levels: Central disc protrusion at C3-4 with mild to moderate spinal stenosis. Left paracentral disc protrusion at C4-5 with moderate spinal stenosis. Additional mild disc bulging at C5-6 and C6-7. Upper chest: Visualized upper chest demonstrates no acute finding. Visualized lung apices are clear. Mild centrilobular emphysema noted. Other: None. IMPRESSION: CT BRAIN: 1. No acute intracranial abnormality. 2. Age-related cerebral atrophy with chronic small vessel ischemic disease with multifocal  posterior ischemic infarcts as above, stable. CT CERVICAL SPINE: No acute traumatic injury within the cervical spine. Electronically Signed   By: Jeannine Boga M.D.   On: 04/03/2018 21:41   Ct Cervical Spine Wo Contrast  Result Date: 04/03/2018 CLINICAL DATA:  Initial evaluation for acute dizziness, fall. EXAM: CT HEAD WITHOUT CONTRAST CT CERVICAL SPINE WITHOUT CONTRAST TECHNIQUE: Multidetector CT imaging of the head and cervical spine was performed following the standard protocol without intravenous contrast. Multiplanar CT image reconstructions of the cervical spine were also generated. COMPARISON:  Prior CT from 02/25/2018 FINDINGS: CT HEAD FINDINGS Brain: Age-related cerebral atrophy with chronic microvascular ischemic disease. Remote left PCA territory infarct involving the left occipital lobe. Additional smaller remote right PCA territory infarct involving the  right occipital lobe. Chronic bilateral basal ganglia and thalamic lacunar infarcts. Bilateral cerebellar infarcts, left greater than right. Appearance is stable from previous. No acute intracranial hemorrhage. No acute large vessel territory infarct. No mass lesion, midline shift or mass effect. No hydrocephalus. No extra-axial fluid collection. Vascular: No hyperdense vessel. Scattered vascular calcifications noted within the carotid siphons. Skull: Scalp soft tissues and calvarium within normal limits. Sinuses/Orbits: Globes and orbital soft tissues normal. Paranasal sinuses and mastoid air cells are clear. Other: None. CT CERVICAL SPINE FINDINGS Alignment: Straightening of the normal cervical lordosis. No listhesis or malalignment. Skull base and vertebrae: Skull base intact. Normal C1-2 articulations are preserved in the dens is intact. Vertebral body heights maintained. No acute fracture. Tiny osseous density adjacent to the superior articular process of the right C3 facet appears chronic in nature, likely degenerative. Soft tissues and spinal canal: Soft tissues of the neck demonstrate no acute finding. No abnormal prevertebral edema. Spinal canal within normal limits. Vascular calcifications about the carotid bifurcations. Disc levels: Central disc protrusion at C3-4 with mild to moderate spinal stenosis. Left paracentral disc protrusion at C4-5 with moderate spinal stenosis. Additional mild disc bulging at C5-6 and C6-7. Upper chest: Visualized upper chest demonstrates no acute finding. Visualized lung apices are clear. Mild centrilobular emphysema noted. Other: None. IMPRESSION: CT BRAIN: 1. No acute intracranial abnormality. 2. Age-related cerebral atrophy with chronic small vessel ischemic disease with multifocal posterior ischemic infarcts as above, stable. CT CERVICAL SPINE: No acute traumatic injury within the cervical spine. Electronically Signed   By: Jeannine Boga M.D.   On: 04/03/2018 21:41    Scheduled Meds: . aspirin EC  81 mg Oral Daily  . clopidogrel  75 mg Oral Daily  . enoxaparin (LOVENOX) injection  40 mg Subcutaneous Daily  . fludrocortisone  0.1 mg Oral Daily  . insulin aspart  0-9 Units Subcutaneous TID WC  . insulin glargine  30 Units Subcutaneous QHS  . phosphorus  500 mg Oral BID  . rosuvastatin  5 mg Oral Daily  . sertraline  25 mg Oral Daily   Continuous Infusions:   LOS: 0 days   Kerney Elbe, DO [facility] PAGER is on AMION  If 7PM-7AM, please contact night-coverage [email or web address] Password [facility] 04/04/2018, 6:27 PM

## 2018-04-04 NOTE — Clinical Social Work Note (Signed)
Clinical Social Work Assessment  Patient Details  Name: Harry [name] MRN: [id number] Date of Birth: 1946/01/10  Date of referral:  04/04/18               Reason for consult:  Discharge Planning                Permission sought to share information with:  Case Manager Permission granted to share information::  Yes, Verbal Permission Granted  Name::     Harry [name]  Agency::  SNFs  Relationship::  daughter  Contact Information:  [phone]  Housing/Transportation Living arrangements for the past 2 months:  Single Family Home(with daughter) Source of Information:  Patient Patient Interpreter Needed:  None Criminal Activity/Legal Involvement Pertinent to Current Situation/Hospitalization:  No - Comment as needed Significant Relationships:  Adult Children Lives with:  Adult Children Do you feel safe going back to the place where you live?  No Need for family participation in patient care:  Yes (Comment)  Care giving concerns:  CSW received referral for possible SNF placement at time of discharge. Spoke with patient regarding possibility of SNF placement . Patient's daughter Harry [name] whom he lives with   is currently unable to care for her at their home given patient's current needs and fall risk. She also has a one month old at home to care for. Patient and daughter Harry [name]   expressed understanding of PT recommendation and are agreeable to SNF placement at time of discharge. CSW to continue to follow and assist with discharge planning needs.     Social Worker assessment / plan:  Spoke with patient and  Daughter Harry [name]   concerning possibility of rehab at [facility] before returning home.    Employment status:  Retired Forensic scientist:  Medicare PT Recommendations:  [place] / Referral to community resources:  [place]  Patient/Family's Response to care:  Patient and  Daughter Harry [name]   recognize need for rehab before returning home and are  agreeable to a SNF in [place]. They report preference for   [facility]. If [facility] has no beds they would like to explore options in [place] . CSW explained insurance authorization process. Patient's family reported that they want patient to get stronger to be able to come back home.    Patient/Family's Understanding of and Emotional Response to Diagnosis, Current Treatment, and Prognosis:  Patient/family is realistic regarding therapy needs and expressed being hopeful for SNF placement. Patient expressed understanding of CSW role and discharge process as well as medical condition. No questions/concerns about plan or treatment.    Emotional Assessment Appearance:  Appears stated age Attitude/Demeanor/Rapport:  Gracious Affect (typically observed):  Accepting Orientation:  Oriented to Self, Oriented to Place, Oriented to  Time, Oriented to Situation Alcohol / Substance use:  Not Applicable Psych involvement (Current and /or in the community):  No (Comment)  Discharge Needs  Concerns to be addressed:  Discharge Planning Concerns Readmission within the last 30 days:  No Current discharge risk:  Dependent with Mobility Barriers to Discharge:  Continued Medical Work up   [employer], [place] 04/04/2018, 2:07 PM

## 2018-04-04 NOTE — Evaluation (Signed)
Physical Therapy Evaluation Patient Details Name: Harry [name] MRN: [id number] DOB: 1946-01-30 Today's Date: 04/04/2018   History of Present Illness  73 y.o. male with medical history significant of CKD 3, hypertension, hyperlipidemia, PVD status post atherectomy, type 2 diabetes, CVA presenting to the hospital via EMS for evaluation of fall and rib pain.  Xray negative for rib fxs. Pt admitted for orthostatic hypotension.     Clinical Impression  Pt admitted with above diagnosis. Pt currently with functional limitations due to the deficits listed below (see PT Problem List). PTA pt lived at home with his daughter, independent/mod I with mobility and ADLS. On eval, pt required mod assist bed mobility and mod assist sit to stand with RW. Pt orthostatic with standing requiring return to supine in bed. Pt will benefit from skilled PT to increase their independence and safety with mobility to allow discharge to the venue listed below.   Orthostatic vitals:           Supine      Sitting     Standing BP     125/60     113/69       81/45 HR         91            94             99  Pt struggled to maintain standing to obtain standing BP.     Follow Up Recommendations SNF;Supervision/Assistance - 24 hour    Equipment Recommendations  None recommended by PT    Recommendations for Other Services       Precautions / Restrictions Precautions Precautions: Fall Precaution Comments: orthostatic      Mobility  Bed Mobility Overal bed mobility: Needs Assistance Bed Mobility: Supine to Sit;Sit to Supine     Supine to sit: Mod assist;HOB elevated Sit to supine: Mod assist   General bed mobility comments: +rail, cues for sequencing, increased time and effort  Transfers Overall transfer level: Needs assistance Equipment used: Rolling walker (2 wheeled) Transfers: Sit to/from Stand Sit to Stand: Mod assist         General transfer comment: cues for hand placement, mod assist to  power up. Mod assist to maintain static stand with RW for orthostatic BP.  Ambulation/Gait             General Gait Details: unable due to orthostatic  Stairs            Wheelchair Mobility    Modified Rankin (Stroke Patients Only)       Balance                                             Pertinent Vitals/Pain Pain Assessment: Faces Faces Pain Scale: Hurts even more Pain Location: L flank during mobility Pain Descriptors / Indicators: Grimacing;Guarding;Sore Pain Intervention(s): Monitored during session;Repositioned    Home Living Family/patient expects to be discharged to:: Private residence Living Arrangements: Children Available Help at Discharge: Family;Available 24 hours/day Type of Home: House Home Access: Level entry     Home Layout: Two level Home Equipment: Walker - 2 wheels;Walker - 4 wheels;Cane - single point      Prior Function Level of Independence: Needs assistance   Gait / Transfers Assistance Needed: ambulates household distances without AD. RW for community ambulation. Multi falls  ADL's / [employer]  Needed: Reports indep for ADLs        Hand Dominance   Dominant Hand: Right    Extremity/Trunk Assessment   Upper Extremity Assessment Upper Extremity Assessment: Generalized weakness    Lower Extremity Assessment Lower Extremity Assessment: Generalized weakness    Cervical / Trunk Assessment Cervical / Trunk Assessment: Kyphotic  Communication   Communication: HOH  Cognition Arousal/Alertness: Lethargic Behavior During Therapy: Flat affect Overall Cognitive Status: No family/caregiver present to determine baseline cognitive functioning                                 General Comments: A&O x 3. Follows commands.       General Comments      Exercises     Assessment/Plan    PT Assessment Patient needs continued PT services  PT Problem List Decreased strength;Decreased  balance;Pain;Decreased mobility;Decreased activity tolerance;Decreased safety awareness       PT Treatment Interventions Functional mobility training;Balance training;Patient/family education    PT Goals (Current goals can be found in the Care Plan section)  Acute Rehab PT Goals Patient Stated Goal: figure out why I'm dizzy PT Goal Formulation: With patient Time For Goal Achievement: 04/18/18 Potential to Achieve Goals: Fair    Frequency Min 3X/week   Barriers to discharge        Co-evaluation               AM-PAC PT "6 Clicks" Mobility  Outcome Measure Help needed turning from your back to your side while in a flat bed without using bedrails?: A Little Help needed moving from lying on your back to sitting on the side of a flat bed without using bedrails?: A Lot Help needed moving to and from a bed to a chair (including a wheelchair)?: A Lot Help needed standing up from a chair using your arms (e.g., wheelchair or bedside chair)?: A Lot Help needed to walk in hospital room?: A Lot Help needed climbing 3-5 steps with a railing? : Total 6 Click Score: 12    End of Session Equipment Utilized During Treatment: Gait belt Activity Tolerance: Treatment limited secondary to medical complications (Comment)(orthostatic) Patient left: in bed;with call bell/phone within reach;with bed alarm set Nurse Communication: Mobility status PT Visit Diagnosis: Other abnormalities of gait and mobility (R26.89);Pain;Muscle weakness (generalized) (M62.81);Difficulty in walking, not elsewhere classified (R26.2)    Time: 0937-1000 PT Time Calculation (min) (ACUTE ONLY): 23 min   Charges:   PT Evaluation $PT Eval Moderate Complexity: 1 Mod PT Treatments $Gait Training: 8-22 mins        Lorrin Goodell, PT  Office # [phone] Pager [phone]   Lorriane Shire 04/04/2018, 10:15 AM
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
## 2018-04-04 NOTE — ED Notes (Signed)
I have spoke to Dr. Marlowe Sax concerning pt bp 200/100, NO meds at this time d/t orthostatics.

## 2018-04-05 LAB — COMPREHENSIVE METABOLIC PANEL
ALT: 11 U/L (ref 0–44)
AST: 14 U/L — ABNORMAL LOW (ref 15–41)
Albumin: 3 g/dL — ABNORMAL LOW (ref 3.5–5.0)
Alkaline Phosphatase: 46 U/L (ref 38–126)
Anion gap: 8 (ref 5–15)
BUN: 14 mg/dL (ref 8–23)
CO2: 23 mmol/L (ref 22–32)
Calcium: 7.9 mg/dL — ABNORMAL LOW (ref 8.9–10.3)
Chloride: 112 mmol/L — ABNORMAL HIGH (ref 98–111)
Creatinine, Ser: 1.47 mg/dL — ABNORMAL HIGH (ref 0.61–1.24)
GFR calc Af Amer: 55 mL/min — ABNORMAL LOW (ref >60)
GFR calc non Af Amer: 47 mL/min — ABNORMAL LOW (ref >60)
Glucose, Bld: 57 mg/dL — ABNORMAL LOW (ref 70–99)
Potassium: 3 mmol/L — ABNORMAL LOW (ref 3.5–5.1)
Sodium: 143 mmol/L (ref 135–145)
Total Bilirubin: 0.8 mg/dL (ref 0.3–1.2)
Total Protein: 6.5 g/dL (ref 6.5–8.1)

## 2018-04-05 LAB — CBC WITH DIFFERENTIAL/PLATELET
Abs Immature Granulocytes: 0.01 10*3/uL (ref 0.00–0.07)
Basophils Absolute: 0 10*3/uL (ref 0.0–0.1)
Basophils Relative: 1 %
Eosinophils Absolute: 0.1 10*3/uL (ref 0.0–0.5)
Eosinophils Relative: 3 %
HCT: 28.8 % — ABNORMAL LOW (ref 39.0–52.0)
Hemoglobin: 9.2 g/dL — ABNORMAL LOW (ref 13.0–17.0)
Immature Granulocytes: 0 %
Lymphocytes Relative: 29 %
Lymphs Abs: 1.4 10*3/uL (ref 0.7–4.0)
MCH: 28.9 pg (ref 26.0–34.0)
MCHC: 31.9 g/dL (ref 30.0–36.0)
MCV: 90.6 fL (ref 80.0–100.0)
Monocytes Absolute: 0.6 10*3/uL (ref 0.1–1.0)
Monocytes Relative: 13 %
Neutro Abs: 2.6 10*3/uL (ref 1.7–7.7)
Neutrophils Relative %: 54 %
Platelets: 201 10*3/uL (ref 150–400)
RBC: 3.18 MIL/uL — ABNORMAL LOW (ref 4.22–5.81)
RDW: 14.6 % (ref 11.5–15.5)
WBC: 4.8 10*3/uL (ref 4.0–10.5)
nRBC: 0 % (ref 0.0–0.2)

## 2018-04-05 LAB — MAGNESIUM: Magnesium: 1.6 mg/dL — ABNORMAL LOW (ref 1.7–2.4)

## 2018-04-05 LAB — GLUCOSE, CAPILLARY
Glucose-Capillary: 128 mg/dL — ABNORMAL HIGH (ref 70–99)
Glucose-Capillary: 51 mg/dL — ABNORMAL LOW (ref 70–99)
Glucose-Capillary: 97 mg/dL (ref 70–99)
Glucose-Capillary: 190 mg/dL — ABNORMAL HIGH (ref 70–99)
Glucose-Capillary: 136 mg/dL — ABNORMAL HIGH (ref 70–99)
Glucose-Capillary: 164 mg/dL — ABNORMAL HIGH (ref 70–99)

## 2018-04-05 LAB — TSH: TSH: 2.135 u[IU]/mL (ref 0.350–4.500)

## 2018-04-05 LAB — PHOSPHORUS: Phosphorus: 2.5 mg/dL (ref 2.5–4.6)

## 2018-04-05 MED ORDER — INSULIN GLARGINE 100 UNIT/ML ~~LOC~~ SOLN
24.0000 [IU] | Freq: Every day | SUBCUTANEOUS | Status: DC
  Administered 2018-04-05: 24 [IU] via SUBCUTANEOUS
  Filled 2018-04-05: qty 0.24

## 2018-04-05 MED ORDER — CARBAMIDE PEROXIDE 6.5 % OT SOLN
5.0000 [drp] | Freq: Two times a day (BID) | OTIC | Status: DC
  Administered 2018-04-05 – 2018-04-14 (×17): 5 [drp] via OTIC
  Filled 2018-04-05: qty 15

## 2018-04-05 MED ORDER — MAGNESIUM SULFATE 2 GM/50ML IV SOLN
2.0000 g | Freq: Once | INTRAVENOUS | Status: AC
  Administered 2018-04-05: 2 g via INTRAVENOUS
  Filled 2018-04-05: qty 50

## 2018-04-05 MED ORDER — POTASSIUM CHLORIDE CRYS ER 20 MEQ PO TBCR
40.0000 meq | EXTENDED_RELEASE_TABLET | Freq: Two times a day (BID) | ORAL | Status: AC
  Administered 2018-04-05 (×2): 40 meq via ORAL
  Filled 2018-04-05 (×2): qty 2

## 2018-04-05 MED ORDER — K PHOS MONO-SOD PHOS DI & MONO 155-852-130 MG PO TABS
500.0000 mg | ORAL_TABLET | Freq: Two times a day (BID) | ORAL | Status: AC
  Administered 2018-04-05 (×2): 500 mg via ORAL
  Filled 2018-04-05 (×2): qty 2

## 2018-04-05 MED ORDER — SODIUM CHLORIDE 0.9 % IV BOLUS
500.0000 mL | Freq: Once | INTRAVENOUS | Status: AC
  Administered 2018-04-05: 500 mL via INTRAVENOUS

## 2018-04-05 NOTE — Progress Notes (Signed)
PROGRESS NOTE    Harry [name]  WGN:[id number] DOB: 1945/12/20 DOA: 04/03/2018 PCP: [name], MD   Brief Narrative:  HPI per Dr. Shela Leff on 04/03/2018 Harry [name] is a 73 y.o. male with medical history significant of CKD 3, hypertension, hyperlipidemia, PVD status post atherectomy, type 2 diabetes, CVA presenting to the hospital via EMS for evaluation of fall and rib pain.  Per son at bedside, patient has a history of recurrent falls secondary to his blood pressure dropping.  States patient was walking this morning lost his balance and fell.  Again this evening he fell while walking and has been complaining of left-sided rib pain since then.  Patient states he has been dizzy for the past 3 weeks.  Denies having any chest pain or shortness of breath prior to the falls.  Family states patient did not lose consciousness.  They did not notice any seizure-like activity today.   **Was again Orthostatic again today on repeat. PT evaluated and recommending SNF; Will continue IVF and place TED hose; Will give a 500 mL Bolus today given Orthostatic Hypotension again.   Assessment & Plan:   Principal Problem:   Orthostatic hypotension Active Problems:   Essential hypertension   CKD (chronic kidney disease), stage III (HCC)   Falls   Hypokalemia  Recurrent Falls; Orthostatic hypotension -Orthostatics positive in the ED and again today  -Infectious etiology less likely as patient has afebrile and has no leukocytosis.  Chest x-ray not suggestive of pneumonia. -Head CT and CT C-spine negative for acute abnormality. -May Consider obtaining MRI if still dizzy and Orthostasis is improved -Patient denies having any chest pain.  EKG without acute changes. Checked Troponin and was <0.03 -Cardiac monitoring -IV fluid hydration with NS 75 mL/hr to continue today; Will give 500 mL Bolus this AM -Repeat orthostatics this morning showed that patient dropped again  -Will try TED  Hose -Continue home fludrocortisone 0.1 mg po Daily -PT evaluation recommending SNF and per my discussion with Social work will not be able to go until at least Monday  -May try Abdominal Binder   Left-Sided rib pain secondary to fall -Chest x-ray without evidence of fracture "as the visualized skeletal structures are unremarkable." -C/w Acetaminophen 650 mg po q6hprn Mild Pain  Hypertension -Blood pressure had remained elevated but is now 135/65 this Afternoon -C/w 5 mg of Hydralazine q4hprn SBP >180  Normocytic Anemia/Anemia of Chronic Kidney Disease -Stable.  Hemoglobin 10.3 on admission, at recent baseline. -Hb/Hct now is 9.2/28.8 -Check Anemia Panel in the AM -Continue to Monitor for S/Sx of Bleeding  -Repeat CBC in AM   AKI on CKD 3 -Creatinine 2.1 on Admission, recent baseline 1.8-2.0. -IV fluid hydration with NS at 150 mL/hr x12 now stopped and will decrease to 75 mL/hr x1 Day -Avoid nephrotoxic agents/contrast and avoid Hypotension -BUN/Cr is now improved and is 14/1.47 -Continue to monitor renal function   Peripheral Vascular Disease/PAD -Continue home aspirin 81 mg po Daily and Clopidogrel 75 mg po Daily   Hyperlipidemia -Continue home Rosuvastatin 5 mg po Daily   Depression -Continue home Sertraline 25 mg po Daily   Type 2 Diabetes Mellitus  -CBG 177. -Checked A1c and was 7.1 -Continue home Basaglar but decreased dose from 30 units at bedtime to 24 given Hypoglycemia this AM -Placed on Sensitive Novolog SSI AC -Continue to Monitor CBG's; CBG's had beenranging from 172-243 but since he was hypoglycemic this AM they ware now ranging from 51-190  Hypokalemia -Patient's K+  was 3.0 -Not on home diuretic.  Patient is not complaining of vomiting or diarrhea. -Replete with po Potassium Chloride 40 mEQ BID x2 -Continue to Monitor and Replete as Necessary -Repeat Mag Level in AM   Hypophosphatemia -Patient's Phos Level was 2.5 -Replete with po K Phos  Neutral 500 mg x1 -Continue to Monitor and Replete as Necessary -Repeat Phos Level in AM   Hypomagnesemia -Patient's Mag Level was 1.6 -Replete with IV Mag Sulfate 2 grams again today  -Continue to Monitor and Replete as Necessary -Repeat Mag Level in AM    DVT prophylaxis: Enoxaparin 40 mg sq Daily  Code Status: FULL CODE Family Communication: No family present at bedside Disposition Plan: D/C to SNF when Orthostatic Hypotension is improved  Consultants:   None   Procedures: None   Antimicrobials:  Anti-infectives (From admission, onward)   None     Subjective: Seen and examined at bedside and was resting bed and asked again when he could go home. Still has some rib pain. No Nausea or Vomiting. Was complaining that his ears needed to be cleaned. No other concerns or complaints at this time and is agreeable to SNF.  Objective: Vitals:   04/05/18 0458 04/05/18 0700 04/05/18 0918 04/05/18 1300  BP: (!) 197/93 (!) 153/73 135/65   Pulse: 84 81 78   Resp: 20 16    Temp: 98.4 F (36.9 C) 98.9 F (37.2 C) 98.4 F (36.9 C)   TempSrc: Oral Oral Oral   SpO2: 100%  100%   Weight:    79.9 kg  Height:        Intake/Output Summary (Last 24 hours) at 04/05/2018 1425 Last data filed at 04/05/2018 0800 Gross per 24 hour  Intake 2326.53 ml  Output 1300 ml  Net 1026.53 ml   Filed Weights   04/03/18 2004 04/05/18 1300  Weight: 79.4 kg 79.9 kg   Examination: Physical Exam:  Constitutional: Well-nourished, well-developed slightly overweight African-American male currently no acute distress appears calm and comfortable and is resting in bed Eyes: Lids and conjunctive are normal.  Sclera anicteric ENMT: External ears and nose appear normal.  Grossly normal hearing but is complaining of earwax buildup.  Unable to view because no otoscope on the floor Neck: Appears supple with no JVD Respiratory: Diminished to auscultation bilaterally with no appreciable wheezing, rales rhonchi.   Patient was not tachypneic or using any accessory muscles to breathe Cardiovascular: Regular rate and rhythm.  No appreciable murmurs, rubs, gallops.  No lower extremity edema noted is wearing TED hose Abdomen: Soft, nontender, mildly distended secondary body habitus.  Bowel sounds present GU: Deferred Musculoskeletal: No contractures or cyanosis noted.  No joint deformities in the upper and lower extremities Skin: No appreciable rashes or lesions on to skin evaluation Neurologic: Cranial nerves II through XII gross intact no appreciable focal deficits.  Romberg sign cerebellar reflexes were not assessed Psychiatric: Normal judgment and insight.  Patient is alert and oriented x3.  Data Reviewed: I have personally reviewed following labs and imaging studies  CBC: Recent Labs  Lab 04/03/18 2048 04/04/18 0935 04/05/18 0630  WBC 5.3 4.7 4.8  NEUTROABS 2.9 2.4 2.6  HGB 10.3* 9.5* 9.2*  HCT 33.0* 28.1* 28.8*  MCV 92.4 89.5 90.6  PLT 225 187 094   Basic Metabolic Panel: Recent Labs  Lab 04/03/18 2048 04/04/18 0036 04/04/18 0356 04/04/18 0935 04/05/18 0630  NA 137  --  142 141 143  K 2.8*  --  3.2* 3.3* 3.0*  CL 97*  --  108 108 112*  CO2 27  --  26 24 23   GLUCOSE 177*  --  251* 190* 57*  BUN 26*  --  24* 22 14  CREATININE 2.10*  --  1.77* 1.56* 1.47*  CALCIUM 8.8*  --  8.2* 8.3* 7.9*  MG  --  1.6*  --  1.5* 1.6*  PHOS  --   --   --  2.4* 2.5   GFR: Estimated Creatinine Clearance: 46.1 mL/min (A) (by C-G formula based on SCr of 1.47 mg/dL (H)). Liver Function Tests: Recent Labs  Lab 04/03/18 2048 04/04/18 0935 04/05/18 0630  AST 17 14* 14*  ALT 14 11 11   ALKPHOS 54 49 46  BILITOT 0.7 0.9 0.8  PROT 7.3 6.3* 6.5  ALBUMIN 3.5 3.0* 3.0*   No results for input(s): LIPASE, AMYLASE in the last 168 hours. No results for input(s): AMMONIA in the last 168 hours. Coagulation Profile: No results for input(s): INR, PROTIME in the last 168 hours. Cardiac Enzymes: Recent Labs   Lab 04/04/18 0036  TROPONINI <0.03   BNP (last 3 results) No results for input(s): PROBNP in the last 8760 hours. HbA1C: Recent Labs    04/04/18 0356  HGBA1C 7.1*   CBG: Recent Labs  Lab 04/04/18 2104 04/04/18 2246 04/05/18 0639 04/05/18 0736 04/05/18 1136  GLUCAP 128* 102* 51* 97 190*   Lipid Profile: No results for input(s): CHOL, HDL, LDLCALC, TRIG, CHOLHDL, LDLDIRECT in the last 72 hours. Thyroid Function Tests: Recent Labs    04/05/18 0630  TSH 2.135   Anemia Panel: No results for input(s): VITAMINB12, FOLATE, FERRITIN, TIBC, IRON, RETICCTPCT in the last 72 hours. Sepsis Labs: No results for input(s): PROCALCITON, LATICACIDVEN in the last 168 hours.  No results found for this or any previous visit (from the past 240 hour(s)).    Radiology Studies: Dg Chest 2 View  Result Date: 04/03/2018 CLINICAL DATA:  Patient fell today after standing up from bed. Dizziness. EXAM: CHEST - 2 VIEW COMPARISON:  November 15, 2017 FINDINGS: The heart size and mediastinal contours are within normal limits. Both lungs are clear. The visualized skeletal structures are unremarkable. IMPRESSION: No active cardiopulmonary disease. Electronically Signed   By: Dorise Bullion III M.D   On: 04/03/2018 21:41   Ct Head Wo Contrast  Result Date: 04/03/2018 CLINICAL DATA:  Initial evaluation for acute dizziness, fall. EXAM: CT HEAD WITHOUT CONTRAST CT CERVICAL SPINE WITHOUT CONTRAST TECHNIQUE: Multidetector CT imaging of the head and cervical spine was performed following the standard protocol without intravenous contrast. Multiplanar CT image reconstructions of the cervical spine were also generated. COMPARISON:  Prior CT from 02/25/2018 FINDINGS: CT HEAD FINDINGS Brain: Age-related cerebral atrophy with chronic microvascular ischemic disease. Remote left PCA territory infarct involving the left occipital lobe. Additional smaller remote right PCA territory infarct involving the right occipital  lobe. Chronic bilateral basal ganglia and thalamic lacunar infarcts. Bilateral cerebellar infarcts, left greater than right. Appearance is stable from previous. No acute intracranial hemorrhage. No acute large vessel territory infarct. No mass lesion, midline shift or mass effect. No hydrocephalus. No extra-axial fluid collection. Vascular: No hyperdense vessel. Scattered vascular calcifications noted within the carotid siphons. Skull: Scalp soft tissues and calvarium within normal limits. Sinuses/Orbits: Globes and orbital soft tissues normal. Paranasal sinuses and mastoid air cells are clear. Other: None. CT CERVICAL SPINE FINDINGS Alignment: Straightening of the normal cervical lordosis. No listhesis or malalignment. Skull base and vertebrae: Skull base intact. Normal C1-2  articulations are preserved in the dens is intact. Vertebral body heights maintained. No acute fracture. Tiny osseous density adjacent to the superior articular process of the right C3 facet appears chronic in nature, likely degenerative. Soft tissues and spinal canal: Soft tissues of the neck demonstrate no acute finding. No abnormal prevertebral edema. Spinal canal within normal limits. Vascular calcifications about the carotid bifurcations. Disc levels: Central disc protrusion at C3-4 with mild to moderate spinal stenosis. Left paracentral disc protrusion at C4-5 with moderate spinal stenosis. Additional mild disc bulging at C5-6 and C6-7. Upper chest: Visualized upper chest demonstrates no acute finding. Visualized lung apices are clear. Mild centrilobular emphysema noted. Other: None. IMPRESSION: CT BRAIN: 1. No acute intracranial abnormality. 2. Age-related cerebral atrophy with chronic small vessel ischemic disease with multifocal posterior ischemic infarcts as above, stable. CT CERVICAL SPINE: No acute traumatic injury within the cervical spine. Electronically Signed   By: Jeannine Boga M.D.   On: 04/03/2018 21:41   Ct Cervical  Spine Wo Contrast  Result Date: 04/03/2018 CLINICAL DATA:  Initial evaluation for acute dizziness, fall. EXAM: CT HEAD WITHOUT CONTRAST CT CERVICAL SPINE WITHOUT CONTRAST TECHNIQUE: Multidetector CT imaging of the head and cervical spine was performed following the standard protocol without intravenous contrast. Multiplanar CT image reconstructions of the cervical spine were also generated. COMPARISON:  Prior CT from 02/25/2018 FINDINGS: CT HEAD FINDINGS Brain: Age-related cerebral atrophy with chronic microvascular ischemic disease. Remote left PCA territory infarct involving the left occipital lobe. Additional smaller remote right PCA territory infarct involving the right occipital lobe. Chronic bilateral basal ganglia and thalamic lacunar infarcts. Bilateral cerebellar infarcts, left greater than right. Appearance is stable from previous. No acute intracranial hemorrhage. No acute large vessel territory infarct. No mass lesion, midline shift or mass effect. No hydrocephalus. No extra-axial fluid collection. Vascular: No hyperdense vessel. Scattered vascular calcifications noted within the carotid siphons. Skull: Scalp soft tissues and calvarium within normal limits. Sinuses/Orbits: Globes and orbital soft tissues normal. Paranasal sinuses and mastoid air cells are clear. Other: None. CT CERVICAL SPINE FINDINGS Alignment: Straightening of the normal cervical lordosis. No listhesis or malalignment. Skull base and vertebrae: Skull base intact. Normal C1-2 articulations are preserved in the dens is intact. Vertebral body heights maintained. No acute fracture. Tiny osseous density adjacent to the superior articular process of the right C3 facet appears chronic in nature, likely degenerative. Soft tissues and spinal canal: Soft tissues of the neck demonstrate no acute finding. No abnormal prevertebral edema. Spinal canal within normal limits. Vascular calcifications about the carotid bifurcations. Disc levels:  Central disc protrusion at C3-4 with mild to moderate spinal stenosis. Left paracentral disc protrusion at C4-5 with moderate spinal stenosis. Additional mild disc bulging at C5-6 and C6-7. Upper chest: Visualized upper chest demonstrates no acute finding. Visualized lung apices are clear. Mild centrilobular emphysema noted. Other: None. IMPRESSION: CT BRAIN: 1. No acute intracranial abnormality. 2. Age-related cerebral atrophy with chronic small vessel ischemic disease with multifocal posterior ischemic infarcts as above, stable. CT CERVICAL SPINE: No acute traumatic injury within the cervical spine. Electronically Signed   By: Jeannine Boga M.D.   On: 04/03/2018 21:41   Scheduled Meds: . aspirin EC  81 mg Oral Daily  . clopidogrel  75 mg Oral Daily  . enoxaparin (LOVENOX) injection  40 mg Subcutaneous Daily  . fludrocortisone  0.1 mg Oral Daily  . insulin aspart  0-9 Units Subcutaneous TID WC  . insulin glargine  24 Units Subcutaneous QHS  .  potassium chloride  40 mEq Oral BID  . rosuvastatin  5 mg Oral Daily  . sertraline  25 mg Oral Daily   Continuous Infusions: . sodium chloride    . sodium chloride       LOS: 1 day   Kerney Elbe, DO [facility] PAGER is on AMION  If 7PM-7AM, please contact night-coverage [email or web address] Password [email or web address] 04/05/2018, 2:25 PM

## 2018-04-05 NOTE — Evaluation (Signed)
Occupational Therapy Evaluation Patient Details Name: Harry [name] MRN: [id number] DOB: 09-15-1945 Today's Date: 04/05/2018    History of Present Illness 73 y.o. male with medical history significant of CKD 3, hypertension, hyperlipidemia, PVD status post atherectomy, type 2 diabetes, CVA presenting to the hospital via EMS for evaluation of fall and rib pain.  Xray negative for rib fxs. Pt admitted for orthostatic hypotension.    Clinical Impression   Pt is a 73 yo male s/p above dx. PTA: pt reports independence with most ADL and supervisionA for showering for safety. Pt currently requiring set-upA for UB ADL and modA for LB ADL. Pt modA for transfers only taking a few steps from bed to recliner. Pt reports dizziness in sitting, but resolves within a few seconds. BP 90/60 after activity in sitting. Pt fatigues easily and is very HOH. Pt continues to require increased assist for ADL and mobility and would benefit from continued OT skilled services for ADL, mobility and safety in SNF setting. OT to follow acutely.    Follow Up Recommendations  SNF;Supervision/Assistance - 24 hour    Equipment Recommendations  None recommended by OT    Recommendations for Other Services       Precautions / Restrictions Precautions Precautions: Fall Precaution Comments: orthostatic Restrictions Weight Bearing Restrictions: No      Mobility Bed Mobility Overal bed mobility: Needs Assistance Bed Mobility: Supine to Sit;Sit to Supine     Supine to sit: Mod assist;HOB elevated Sit to supine: Mod assist   General bed mobility comments: rails  Transfers Overall transfer level: Needs assistance Equipment used: Rolling walker (2 wheeled) Transfers: Sit to/from Stand Sit to Stand: Mod assist         General transfer comment: Pt allowed for very littel mobility as he was awoken from a nap    Balance Overall balance assessment: Needs assistance Sitting-balance support: Bilateral upper  extremity supported Sitting balance-Leahy Scale: Fair       Standing balance-Leahy Scale: Poor                             ADL either performed or assessed with clinical judgement   ADL Overall ADL's : Needs assistance/impaired Eating/Feeding: Set up   Grooming: Set up   Upper Body Bathing: Set up;Sitting   Lower Body Bathing: Moderate assistance;Sitting/lateral leans   Upper Body Dressing : Set up;Sitting   Lower Body Dressing: Moderate assistance;Sitting/lateral leans   Toilet Transfer: Minimal assistance;Stand-pivot;BSC;RW   Toileting- Clothing Manipulation and Hygiene: Moderate assistance;Sitting/lateral lean;Sit to/from stand       Functional mobility during ADLs: Rolling walker;Cueing for safety;Moderate assistance General ADL Comments: Pt set-upA for UB ADL and ModA for LB ADL due to weakness and poor mobility. Pt requires increased time and motivational cues to attend to task     Vision Baseline Vision/History: No visual deficits Patient Visual Report: No change from baseline Vision Assessment?: No apparent visual deficits     Perception     Praxis      Pertinent Vitals/Pain Pain Assessment: 0-10 Pain Score: 3  Pain Location: L flank during mobility Pain Descriptors / Indicators: Grimacing;Guarding;Sore     Hand Dominance Right   Extremity/Trunk Assessment Upper Extremity Assessment Upper Extremity Assessment: Generalized weakness   Lower Extremity Assessment Lower Extremity Assessment: Generalized weakness;Defer to PT evaluation   Cervical / Trunk Assessment Cervical / Trunk Assessment: Kyphotic   Communication Communication Communication: HOH   Cognition Arousal/Alertness: Lethargic Behavior  During Therapy: Flat affect Overall Cognitive Status: No family/caregiver present to determine baseline cognitive functioning                                 General Comments: A&O x 3. Follows commands.    General Comments   Dizziness reported, but resolved within a few seconds    Exercises     Shoulder Instructions      Home Living Family/patient expects to be discharged to:: Private residence Living Arrangements: Children Available Help at Discharge: Family;Available 24 hours/day Type of Home: House Home Access: Level entry     Home Layout: Two level Alternate Level Stairs-Number of Steps: 8 Alternate Level Stairs-Rails: Right;Left Bathroom Shower/Tub: Teacher, early years/pre: Standard     Home Equipment: Environmental consultant - 2 wheels;Walker - 4 wheels;Cane - single point          Prior Functioning/Environment Level of Independence: Needs assistance  Gait / Transfers Assistance Needed: ambulates household distances without AD. RW for community ambulation. Multi falls ADL's / Homemaking Assistance Needed: Reports indep for ADLs- assist for bathing for safety            OT Problem List: Decreased strength;Decreased range of motion;Decreased activity tolerance;Impaired vision/perception;Decreased cognition;Decreased safety awareness;Pain;Increased edema      OT Treatment/Interventions: Self-care/ADL training;Therapeutic exercise;Neuromuscular education;Energy conservation;Therapeutic activities;Patient/family education;Balance training    OT Goals(Current goals can be found in the care plan section) Acute Rehab OT Goals Patient Stated Goal: To feel better OT Goal Formulation: With patient Time For Goal Achievement: 04/19/18 Potential to Achieve Goals: Fair ADL Goals Pt Will Perform Lower Body Dressing: with supervision;sit to/from stand Pt Will Transfer to Toilet: with supervision;ambulating;regular height toilet Pt Will Perform Toileting - Clothing Manipulation and hygiene: with min assist;sit to/from stand Additional ADL Goal #1: pt will perform ADL functional transfers with supervisionA for safety.  OT Frequency: Min 2X/week   Barriers to D/C: Decreased caregiver support           Co-evaluation              AM-PAC OT "6 Clicks" Daily Activity     Outcome Measure Help from another person eating meals?: None Help from another person taking care of personal grooming?: A Little Help from another person toileting, which includes using toliet, bedpan, or urinal?: A Lot Help from another person bathing (including washing, rinsing, drying)?: A Lot Help from another person to put on and taking off regular upper body clothing?: A Little Help from another person to put on and taking off regular lower body clothing?: A Lot 6 Click Score: 16   End of Session Equipment Utilized During Treatment: Gait belt;Rolling walker Nurse Communication: Other (comment);Mobility status(catheter came out)  Activity Tolerance: Patient tolerated treatment well;Treatment limited secondary to medical complications (Comment) Patient left: in bed;with call bell/phone within reach;with bed alarm set  OT Visit Diagnosis: Muscle weakness (generalized) (M62.81);Unsteadiness on feet (R26.81)                Time: 1308-6578 OT Time Calculation (min): 25 min Charges:  OT General Charges $OT Visit: 1 Visit OT Evaluation $OT Eval Low Complexity: 1 Low OT Treatments $Self Care/Home Management : 8-22 mins  [name] Hail Harold Hedge) Marsa Aris OTR/L Acute Rehabilitation Services Pager: [phone] Office: [phone]  Fredda Hammed 04/05/2018, 3:59 PM

## 2018-04-05 NOTE — Progress Notes (Signed)
Hypoglycemic Event  CBG: 51  Treatment: 8oz orange juice  Symptoms:none  Follow-up CBG: Time:0736 CBG Result:97  Possible Reasons for Event:  Comments/MD notified:    Debbora Dus

## 2018-04-05 NOTE — Progress Notes (Signed)
Patient's blood pressure elevated gave antihypertensive as ordered

## 2018-04-06 DIAGNOSIS — T383X5A Adverse effect of insulin and oral hypoglycemic [antidiabetic] drugs, initial encounter: Secondary | ICD-10-CM

## 2018-04-06 DIAGNOSIS — E160 Drug-induced hypoglycemia without coma: Secondary | ICD-10-CM

## 2018-04-06 LAB — CBC WITH DIFFERENTIAL/PLATELET
Abs Immature Granulocytes: 0.02 10*3/uL (ref 0.00–0.07)
Basophils Absolute: 0 10*3/uL (ref 0.0–0.1)
Basophils Relative: 1 %
Eosinophils Absolute: 0.3 10*3/uL (ref 0.0–0.5)
Eosinophils Relative: 5 %
HCT: 29.9 % — ABNORMAL LOW (ref 39.0–52.0)
Hemoglobin: 9.4 g/dL — ABNORMAL LOW (ref 13.0–17.0)
Immature Granulocytes: 0 %
Lymphocytes Relative: 34 %
Lymphs Abs: 1.8 10*3/uL (ref 0.7–4.0)
MCH: 28.9 pg (ref 26.0–34.0)
MCHC: 31.4 g/dL (ref 30.0–36.0)
MCV: 92 fL (ref 80.0–100.0)
Monocytes Absolute: 0.6 10*3/uL (ref 0.1–1.0)
Monocytes Relative: 12 %
Neutro Abs: 2.6 10*3/uL (ref 1.7–7.7)
Neutrophils Relative %: 48 %
Platelets: 195 10*3/uL (ref 150–400)
RBC: 3.25 MIL/uL — ABNORMAL LOW (ref 4.22–5.81)
RDW: 14.9 % (ref 11.5–15.5)
WBC: 5.4 10*3/uL (ref 4.0–10.5)
nRBC: 0 % (ref 0.0–0.2)

## 2018-04-06 LAB — GLUCOSE, CAPILLARY
Glucose-Capillary: 45 mg/dL — ABNORMAL LOW (ref 70–99)
Glucose-Capillary: 55 mg/dL — ABNORMAL LOW (ref 70–99)
Glucose-Capillary: 199 mg/dL — ABNORMAL HIGH (ref 70–99)
Glucose-Capillary: 101 mg/dL — ABNORMAL HIGH (ref 70–99)
Glucose-Capillary: 98 mg/dL (ref 70–99)
Glucose-Capillary: 92 mg/dL (ref 70–99)

## 2018-04-06 LAB — COMPREHENSIVE METABOLIC PANEL
ALT: 10 U/L (ref 0–44)
AST: 14 U/L — ABNORMAL LOW (ref 15–41)
Albumin: 3 g/dL — ABNORMAL LOW (ref 3.5–5.0)
Alkaline Phosphatase: 43 U/L (ref 38–126)
Anion gap: 11 (ref 5–15)
BUN: 12 mg/dL (ref 8–23)
CO2: 22 mmol/L (ref 22–32)
Calcium: 8.1 mg/dL — ABNORMAL LOW (ref 8.9–10.3)
Chloride: 110 mmol/L (ref 98–111)
Creatinine, Ser: 1.38 mg/dL — ABNORMAL HIGH (ref 0.61–1.24)
GFR calc Af Amer: 59 mL/min — ABNORMAL LOW (ref >60)
GFR calc non Af Amer: 51 mL/min — ABNORMAL LOW (ref >60)
Glucose, Bld: 95 mg/dL (ref 70–99)
Potassium: 3.5 mmol/L (ref 3.5–5.1)
Sodium: 143 mmol/L (ref 135–145)
Total Bilirubin: 0.7 mg/dL (ref 0.3–1.2)
Total Protein: 6.4 g/dL — ABNORMAL LOW (ref 6.5–8.1)

## 2018-04-06 LAB — PHOSPHORUS: Phosphorus: 4.3 mg/dL (ref 2.5–4.6)

## 2018-04-06 LAB — MAGNESIUM: Magnesium: 1.7 mg/dL (ref 1.7–2.4)

## 2018-04-06 MED ORDER — MAGNESIUM SULFATE 2 GM/50ML IV SOLN
2.0000 g | Freq: Once | INTRAVENOUS | Status: AC
  Administered 2018-04-06: 2 g via INTRAVENOUS
  Filled 2018-04-06: qty 50

## 2018-04-06 MED ORDER — INSULIN GLARGINE 100 UNIT/ML ~~LOC~~ SOLN
18.0000 [IU] | Freq: Every day | SUBCUTANEOUS | Status: DC
  Administered 2018-04-06: 18 [IU] via SUBCUTANEOUS
  Filled 2018-04-06: qty 0.18

## 2018-04-06 MED ORDER — POTASSIUM CHLORIDE CRYS ER 20 MEQ PO TBCR
40.0000 meq | EXTENDED_RELEASE_TABLET | Freq: Two times a day (BID) | ORAL | Status: AC
  Administered 2018-04-06 (×2): 40 meq via ORAL
  Filled 2018-04-06 (×2): qty 2

## 2018-04-06 NOTE — Progress Notes (Signed)
PROGRESS NOTE    Harry [name]  JSH:[id number] DOB: 05-30-1945 DOA: 04/03/2018 PCP: [name], MD   Brief Narrative:  HPI per Dr. Shela Leff on 04/03/2018 Harry [name] is a 73 y.o. male with medical history significant of CKD 3, hypertension, hyperlipidemia, PVD status post atherectomy, type 2 diabetes, CVA presenting to the hospital via EMS for evaluation of fall and rib pain.  Per son at bedside, patient has a history of recurrent falls secondary to his blood pressure dropping.  States patient was walking this morning lost his balance and fell.  Again this evening he fell while walking and has been complaining of left-sided rib pain since then.  Patient states he has been dizzy for the past 3 weeks.  Denies having any chest pain or shortness of breath prior to the falls.  Family states patient did not lose consciousness.  They did not notice any seizure-like activity today.   **Was again Orthostatic again yesterday and repeat pending today. PT evaluated and recommending SNF; Will continue IVF and place TED hose; Given a 500 mL Bolus today given Orthostatic Hypotension again.  Hospitalization has been complicated by hypoglycemia.  Assessment & Plan:   Principal Problem:   Orthostatic hypotension Active Problems:   Essential hypertension   CKD (chronic kidney disease), stage III (HCC)   Falls   Hypokalemia  Recurrent Falls; Orthostatic hypotension -Orthostatics positive in the ED and again today  -Infectious etiology less likely as patient has afebrile and has no leukocytosis.  Chest x-ray not suggestive of pneumonia. -Head CT and CT C-spine negative for acute abnormality. -May Consider obtaining MRI if still dizzy and Orthostasis is improved -Patient denies having any chest pain.  EKG without acute changes. Checked Troponin and was <0.03 -Cardiac monitoring -IV fluid hydration with NS 75 mL/hr to continue today; Will give 500 mL Bolus this AM -Repeat orthostatics this  morning showed that patient dropped again  -Will try TED Hose -Continue home fludrocortisone 0.1 mg po Daily -PT evaluation recommending SNF and per my discussion with Social work will not be able to go until at least Monday  -May try Abdominal Binder   Left-Sided rib pain secondary to fall -Chest x-ray without evidence of fracture "as the visualized skeletal structures are unremarkable." -C/w Acetaminophen 650 mg po q6hprn Mild Pain  Hypertension -Blood pressure had remained elevated but improved yesterday but is now 172/84 -C/w 5 mg of Hydralazine q4hprn SBP >180  Normocytic Anemia/Anemia of Chronic Kidney Disease -Stable.  Hemoglobin 10.3 on admission, at recent baseline. -Hb/Hct now is 9.4/29.9 -Check Anemia Panel in the AM -Continue to Monitor for S/Sx of Bleeding  -Repeat CBC in AM   AKI on CKD 3 -Creatinine 2.1 on Admission, recent baseline 1.8-2.0. -IV fluid hydration is now stopped -Avoid nephrotoxic agents/contrast and avoid Hypotension -BUN/Cr is now improved and is 12/1.38 -Continue to monitor renal function   Peripheral Vascular Disease/PAD -Continue home Aspirin 81 mg po Daily and Clopidogrel 75 mg po Daily   Hyperlipidemia -Continue home Rosuvastatin 5 mg po Daily   Depression -Continue home Sertraline 25 mg po Daily   Type 2 Diabetes Mellitus complicated by Hypoglycemia -CBG 177 on Admission. -Checked A1c and was 7.1 -Continue home Basaglar but decreased dose from 30 units at bedtime to 24 given Hypoglycemia but further decreased to 18 units sq qHS due to Hypoglycemia again this AM  -Placed on Sensitive Novolog SSI AC -Continue to Monitor CBG's; CBG's had been ranging from 172-243 but since he was  hypoglycemic this AM they ware now ranging from 45-199  Hypokalemia -Patient's K+ was 3.5 -Not on home diuretic.  Patient is not complaining of vomiting or diarrhea. -Replete with po Potassium Chloride 40 mEQ po x2 doses -Continue to Monitor and  Replete as Necessary -Repeat Mag Level in AM   Hypophosphatemia -Patient's Phos Level was 4.3 -Continue to Monitor and Replete as Necessary -Repeat Phos Level in AM   Hypomagnesemia -Patient's Mag Level was 1.7 -Replete with IV Mag Sulfate 2 grams again today  -Continue to Monitor and Replete as Necessary -Repeat Mag Level in AM   DVT prophylaxis: Enoxaparin 40 mg sq Daily  Code Status: FULL CODE Family Communication: No family present at bedside Disposition Plan: D/C to SNF when Orthostatic Hypotension is improved  Consultants:   None   Procedures: None   Antimicrobials:  Anti-infectives (From admission, onward)   None     Subjective: Seen and examined at bedside and was asleep when I walked him and awoke him from sleep. No Nausea or vomiting. States his Pain from falling is better. No lightheadedness or dizziness. Repeat Orthostatic Vital Signs pending to be done. No CP or SOB. States the drops in his ears help. No other concerns or complaints at this time.   Objective: Vitals:   04/05/18 1624 04/05/18 1920 04/06/18 0407 04/06/18 0914  BP: (!) 185/84 (!) 179/88 (!) 168/79 (!) 172/84  Pulse: 81 86 85 85  Resp:  16    Temp: 98.2 F (36.8 C) 97.7 F (36.5 C) 98.6 F (37 C) 98.3 F (36.8 C)  TempSrc: Oral Oral Oral Oral  SpO2: 100% 100% 96% 98%  Weight:      Height:        Intake/Output Summary (Last 24 hours) at 04/06/2018 1353 Last data filed at 04/06/2018 0754 Gross per 24 hour  Intake 762.46 ml  Output 1150 ml  Net -387.54 ml   Filed Weights   04/03/18 2004 04/05/18 1300  Weight: 79.4 kg 79.9 kg   Examination: Physical Exam:  Constitutional: Well-nourished, well-developed slightly overweight African-American male currently no acute distress appears calm and comfortable sitting in bed Eyes: Lids and conjunctive are normal.  Sclera anicteric ENMT: External ears and nose appear normal.  Grossly normal hearing and states that his ears are feeling better  after his otic drops Neck: Appears supple no JVD Respiratory: Slightly diminished to auscultation bilaterally no appreciable wheezing, rales, rhonchi.  Patient not tachypneic or using any accessory muscles to breathe Cardiovascular: Regular rate and rhythm.  No appreciable murmurs, rubs, gallops.  Is wearing TED hose and has no lower extremity edema noted Abdomen: Soft, nontender, mildly distended secondary body habitus.  Bowel sounds present GU: Deferred Musculoskeletal: No contractures or cyanosis noted.  No joint tremors in the upper and lower extremities Skin: No appreciable rashes or lesions on limited skin evaluation.  He is wearing TED hose Neurologic: Cranial nerves II through XII grossly intact with no appreciable focal deficits.  Romberg sign and cerebellar reflexes were not assessed Psychiatric: Judgment and insight.  Patient was slightly drowsy but easily arousable.  Once fully awake, he is alert and oriented x3  Data Reviewed: I have personally reviewed following labs and imaging studies  CBC: Recent Labs  Lab 04/03/18 2048 04/04/18 0935 04/05/18 0630 04/06/18 0353  WBC 5.3 4.7 4.8 5.4  NEUTROABS 2.9 2.4 2.6 2.6  HGB 10.3* 9.5* 9.2* 9.4*  HCT 33.0* 28.1* 28.8* 29.9*  MCV 92.4 89.5 90.6 92.0  PLT 225 187  201 532   Basic Metabolic Panel: Recent Labs  Lab 04/03/18 2048 04/04/18 0036 04/04/18 0356 04/04/18 0935 04/05/18 0630 04/06/18 0353  NA 137  --  142 141 143 143  K 2.8*  --  3.2* 3.3* 3.0* 3.5  CL 97*  --  108 108 112* 110  CO2 27  --  26 24 23 22   GLUCOSE 177*  --  251* 190* 57* 95  BUN 26*  --  24* 22 14 12   CREATININE 2.10*  --  1.77* 1.56* 1.47* 1.38*  CALCIUM 8.8*  --  8.2* 8.3* 7.9* 8.1*  MG  --  1.6*  --  1.5* 1.6* 1.7  PHOS  --   --   --  2.4* 2.5 4.3   GFR: Estimated Creatinine Clearance: 49.1 mL/min (A) (by C-G formula based on SCr of 1.38 mg/dL (H)). Liver Function Tests: Recent Labs  Lab 04/03/18 2048 04/04/18 0935 04/05/18 0630  04/06/18 0353  AST 17 14* 14* 14*  ALT 14 11 11 10   ALKPHOS 54 49 46 43  BILITOT 0.7 0.9 0.8 0.7  PROT 7.3 6.3* 6.5 6.4*  ALBUMIN 3.5 3.0* 3.0* 3.0*   No results for input(s): LIPASE, AMYLASE in the last 168 hours. No results for input(s): AMMONIA in the last 168 hours. Coagulation Profile: No results for input(s): INR, PROTIME in the last 168 hours. Cardiac Enzymes: Recent Labs  Lab 04/04/18 0036  TROPONINI <0.03   BNP (last 3 results) No results for input(s): PROBNP in the last 8760 hours. HbA1C: Recent Labs    04/04/18 0356  HGBA1C 7.1*   CBG: Recent Labs  Lab 04/05/18 2051 04/06/18 0654 04/06/18 0726 04/06/18 0836 04/06/18 1207  GLUCAP 164* 45* 55* 199* 101*   Lipid Profile: No results for input(s): CHOL, HDL, LDLCALC, TRIG, CHOLHDL, LDLDIRECT in the last 72 hours. Thyroid Function Tests: Recent Labs    04/05/18 0630  TSH 2.135   Anemia Panel: No results for input(s): VITAMINB12, FOLATE, FERRITIN, TIBC, IRON, RETICCTPCT in the last 72 hours. Sepsis Labs: No results for input(s): PROCALCITON, LATICACIDVEN in the last 168 hours.  No results found for this or any previous visit (from the past 240 hour(s)).    Radiology Studies: No results found. Scheduled Meds: . aspirin EC  81 mg Oral Daily  . carbamide peroxide  5 drop Both EARS BID  . clopidogrel  75 mg Oral Daily  . enoxaparin (LOVENOX) injection  40 mg Subcutaneous Daily  . fludrocortisone  0.1 mg Oral Daily  . insulin aspart  0-9 Units Subcutaneous TID WC  . insulin glargine  18 Units Subcutaneous QHS  . potassium chloride  40 mEq Oral BID  . rosuvastatin  5 mg Oral Daily  . sertraline  25 mg Oral Daily   Continuous Infusions:    LOS: 2 days   Kerney Elbe, DO [facility] PAGER is on AMION  If 7PM-7AM, please contact night-coverage [email or web address] Password [email or web address] 04/06/2018, 1:53 PM

## 2018-04-06 NOTE — Progress Notes (Signed)
Patient's CBG was 45. Patient given apple juice with sugar, graham crackers.Will recheck patient within 15 minutes. Will continue to monitor patient status.

## 2018-04-06 NOTE — Plan of Care (Signed)
  Problem: Pain Managment: Goal: General experience of comfort will improve Outcome: Progressing   Problem: Safety: Goal: Ability to remain free from injury will improve Outcome: Progressing   Problem: Skin Integrity: Goal: Risk for impaired skin integrity will decrease Outcome: Progressing   

## 2018-04-06 NOTE — Progress Notes (Signed)
Pt alert. No acute distress noted. Pt eating d/t hypoglycemic episode. AM insulin not given. Continue to monitor

## 2018-04-07 LAB — CBC WITH DIFFERENTIAL/PLATELET
Abs Immature Granulocytes: 0.02 10*3/uL (ref 0.00–0.07)
Basophils Absolute: 0 10*3/uL (ref 0.0–0.1)
Basophils Relative: 1 %
Eosinophils Absolute: 0.3 10*3/uL (ref 0.0–0.5)
Eosinophils Relative: 6 %
HCT: 30 % — ABNORMAL LOW (ref 39.0–52.0)
Hemoglobin: 9.4 g/dL — ABNORMAL LOW (ref 13.0–17.0)
Immature Granulocytes: 0 %
Lymphocytes Relative: 33 %
Lymphs Abs: 1.7 10*3/uL (ref 0.7–4.0)
MCH: 28.7 pg (ref 26.0–34.0)
MCHC: 31.3 g/dL (ref 30.0–36.0)
MCV: 91.7 fL (ref 80.0–100.0)
Monocytes Absolute: 0.6 10*3/uL (ref 0.1–1.0)
Monocytes Relative: 12 %
Neutro Abs: 2.4 10*3/uL (ref 1.7–7.7)
Neutrophils Relative %: 48 %
Platelets: 207 10*3/uL (ref 150–400)
RBC: 3.27 MIL/uL — ABNORMAL LOW (ref 4.22–5.81)
RDW: 14.8 % (ref 11.5–15.5)
WBC: 5 10*3/uL (ref 4.0–10.5)
nRBC: 0 % (ref 0.0–0.2)

## 2018-04-07 LAB — COMPREHENSIVE METABOLIC PANEL
ALT: 13 U/L (ref 0–44)
AST: 16 U/L (ref 15–41)
Albumin: 3.1 g/dL — ABNORMAL LOW (ref 3.5–5.0)
Alkaline Phosphatase: 46 U/L (ref 38–126)
Anion gap: 10 (ref 5–15)
BUN: 15 mg/dL (ref 8–23)
CO2: 23 mmol/L (ref 22–32)
Calcium: 8.3 mg/dL — ABNORMAL LOW (ref 8.9–10.3)
Chloride: 109 mmol/L (ref 98–111)
Creatinine, Ser: 1.49 mg/dL — ABNORMAL HIGH (ref 0.61–1.24)
GFR calc Af Amer: 54 mL/min — ABNORMAL LOW (ref >60)
GFR calc non Af Amer: 47 mL/min — ABNORMAL LOW (ref >60)
Glucose, Bld: 108 mg/dL — ABNORMAL HIGH (ref 70–99)
Potassium: 3.8 mmol/L (ref 3.5–5.1)
Sodium: 142 mmol/L (ref 135–145)
Total Bilirubin: 0.8 mg/dL (ref 0.3–1.2)
Total Protein: 6.5 g/dL (ref 6.5–8.1)

## 2018-04-07 LAB — PHOSPHORUS: Phosphorus: 2.8 mg/dL (ref 2.5–4.6)

## 2018-04-07 LAB — GLUCOSE, CAPILLARY
Glucose-Capillary: 71 mg/dL (ref 70–99)
Glucose-Capillary: 90 mg/dL (ref 70–99)
Glucose-Capillary: 127 mg/dL — ABNORMAL HIGH (ref 70–99)
Glucose-Capillary: 196 mg/dL — ABNORMAL HIGH (ref 70–99)

## 2018-04-07 LAB — MAGNESIUM: Magnesium: 1.8 mg/dL (ref 1.7–2.4)

## 2018-04-07 MED ORDER — SODIUM CHLORIDE 0.9 % IV BOLUS
1000.0000 mL | Freq: Once | INTRAVENOUS | Status: AC
  Administered 2018-04-07: 1000 mL via INTRAVENOUS

## 2018-04-07 MED ORDER — SODIUM CHLORIDE 0.9 % IV BOLUS
500.0000 mL | Freq: Once | INTRAVENOUS | Status: AC
  Administered 2018-04-07: 500 mL via INTRAVENOUS

## 2018-04-07 MED ORDER — INSULIN GLARGINE 100 UNIT/ML ~~LOC~~ SOLN
14.0000 [IU] | Freq: Every day | SUBCUTANEOUS | Status: DC
  Administered 2018-04-07 – 2018-04-13 (×7): 14 [IU] via SUBCUTANEOUS
  Filled 2018-04-07 (×8): qty 0.14

## 2018-04-07 NOTE — Plan of Care (Signed)

## 2018-04-07 NOTE — Progress Notes (Signed)
Physical Therapy Treatment Patient Details Name: Harry [name] MRN: [id number] DOB: Apr 11, 1945 Today's Date: 04/07/2018    History of Present Illness 73 y.o. male with medical history significant of CKD 3, hypertension, hyperlipidemia, PVD status post atherectomy, type 2 diabetes, CVA presenting to the hospital via EMS for evaluation of fall and rib pain.  Xray negative for rib fxs. Pt admitted for orthostatic hypotension.     PT Comments    Patient seen for mobility progression. Pt continues to be limited due to orthostatic BP, see below, and c/o "wooziness" with postural changes. Pt requires mod A for sit to stand transfers from EOB and min A for stand pivot EOB to recliner due to generalized weakness and impaired balance. Pt will continue to benefit from further skilled PT services to maximize independence and safety with mobility.    BP using Dinamap  Supine 169/80 Sitting 169/140 Standing 100/62 Sitting end of session 101/62    Follow Up Recommendations  SNF;Supervision/Assistance - 24 hour     Equipment Recommendations  None recommended by PT    Recommendations for Other Services       Precautions / Restrictions Precautions Precautions: Fall Precaution Comments: orthostatic    Mobility  Bed Mobility Overal bed mobility: Needs Assistance Bed Mobility: Supine to Sit     Supine to sit: HOB elevated;Min guard     General bed mobility comments: HOB elevated; min guard for safety  Transfers Overall transfer level: Needs assistance Equipment used: Rolling walker (2 wheeled) Transfers: Sit to/from Stand; stand pivot  Sit to Stand: Mod assist  Stand pivot: min A       General transfer comment: assist to power up into standing and to steady; pt stood X 2 from EOB  Ambulation/Gait             General Gait Details: pivotal steps from bed to recliner; unable to progress gait due to orthostatic BP   Stairs             Wheelchair Mobility     Modified Rankin (Stroke Patients Only)       Balance Overall balance assessment: Needs assistance Sitting-balance support: Bilateral upper extremity supported Sitting balance-Leahy Scale: Fair       Standing balance-Leahy Scale: Poor Standing balance comment: while standing with single UE support for BP pt required increased assist for balance due to R lateral lean and increasing trunk flexion                            Cognition Arousal/Alertness: Awake/alert Behavior During Therapy: WFL for tasks assessed/performed;Flat affect Overall Cognitive Status: Within Functional Limits for tasks assessed                                 General Comments: HOH      Exercises      General Comments General comments (skin integrity, edema, etc.): pt c/o "wooziness" and dizziness in sitting/standing; pt had episodes of blank stare while sitting and standing      Pertinent Vitals/Pain Pain Assessment: Faces Faces Pain Scale: No hurt Pain Intervention(s): Monitored during session    Home Living                      Prior Function            PT Goals (current goals can now be found in  the care plan section) Progress towards PT goals: Progressing toward goals(limited by orthostatic BP)    Frequency    Min 3X/week      PT Plan Current plan remains appropriate    Co-evaluation              AM-PAC PT "6 Clicks" Mobility   Outcome Measure  Help needed turning from your back to your side while in a flat bed without using bedrails?: A Little Help needed moving from lying on your back to sitting on the side of a flat bed without using bedrails?: A Little Help needed moving to and from a bed to a chair (including a wheelchair)?: A Lot Help needed standing up from a chair using your arms (e.g., wheelchair or bedside chair)?: A Lot Help needed to walk in hospital room?: A Lot Help needed climbing 3-5 steps with a railing? : Total 6  Click Score: 13    End of Session Equipment Utilized During Treatment: Gait belt Activity Tolerance: Treatment limited secondary to medical complications (Comment)(orthostatic) Patient left: with call bell/phone within reach;in chair;with family/visitor present;with nursing/sitter in room Nurse Communication: Mobility status PT Visit Diagnosis: Other abnormalities of gait and mobility (R26.89);Pain;Muscle weakness (generalized) (M62.81);Difficulty in walking, not elsewhere classified (R26.2)     Time: 8366-2947 PT Time Calculation (min) (ACUTE ONLY): 33 min  Charges:  $Gait Training: 23-37 mins                     Earney Navy, PTA Acute Rehabilitation Services Pager: [phone] Office: [phone]     Darliss Cheney 04/07/2018, 10:44 AM

## 2018-04-07 NOTE — Consult Note (Signed)
   [facility] CM Inpatient Consult   04/07/2018  OLIVE MOTYKA [birth year] [id number]   Patient screened for potential [facility] Care Management services due to unplanned readmission risk score of 23%, high, and 4 hospitalizations within past 6 months.   Spoke with inpatient LCSW and current disposition plan is for SNF, [facility].  No [facility] identifiable needs at this time.  Netta Cedars, MSN, [facility] Liaison Nurse Mobile Phone [phone]  Toll free office [phone]

## 2018-04-07 NOTE — Progress Notes (Signed)
Orthopedic Tech Progress Note Patient Details:  Harry [name] 06-05-1945 [id number]  Ortho Devices Type of Ortho Device: Abdominal binder       [name] 04/07/2018, 4:16 PM

## 2018-04-07 NOTE — Progress Notes (Signed)
Occupational Therapy Treatment Patient Details Name: Harry [name] MRN: [id number] DOB: 10-16-1945 Today's Date: 04/07/2018    History of present illness 73 y.o. male with medical history significant of CKD 3, hypertension, hyperlipidemia, PVD status post atherectomy, type 2 diabetes, CVA presenting to the hospital via EMS for evaluation of fall and rib pain.  Xray negative for rib fxs. Pt admitted for orthostatic hypotension.    OT comments  Pt limited by dizziness this session. Reports room is spinning no matter what the position (supine vs sitting EOB) declined OOB this session. Provided peri care at bed level at mod A, as well as bed mobility at min guard level. OT will continue to follow acutely. Pt provided with education that it would benefit him to elevate HOB to attempt to address orthostatic issues that he's been having. Pt reports "I just don't feel like myself today" no family present to determine baseline at this time.    Follow Up Recommendations  SNF;Supervision/Assistance - 24 hour    Equipment Recommendations  Other (comment)(defer to next venue)    Recommendations for Other Services      Precautions / Restrictions Precautions Precautions: Fall Precaution Comments: orthostatic Restrictions Weight Bearing Restrictions: No       Mobility Bed Mobility Overal bed mobility: Needs Assistance Bed Mobility: Supine to Sit;Sit to Supine     Supine to sit: Min guard Sit to supine: Min guard   General bed mobility comments: min guard for safety   Transfers                 General transfer comment: Pt declined this session    Balance Overall balance assessment: Needs assistance Sitting-balance support: Single extremity supported;Feet supported Sitting balance-Leahy Scale: Fair                                     ADL either performed or assessed with clinical judgement   ADL Overall ADL's : Needs assistance/impaired     Grooming:  Wash/dry hands;Set up;Bed level           Upper Body Dressing : Set up;Sitting Upper Body Dressing Details (indicate cue type and reason): new hospital gown         Toileting- Clothing Manipulation and Hygiene: Moderate assistance;Bed level Toileting - Clothing Manipulation Details (indicate cue type and reason): Pt provided with warm soapy wash clothes for rear peri care at bed level - Pt declined transfer to [facility], mod A for thoroughness             Vision   Vision Assessment?: No apparent visual deficits   Perception     Praxis      Cognition Arousal/Alertness: Awake/alert Behavior During Therapy: Flat affect Overall Cognitive Status: No family/caregiver present to determine baseline cognitive functioning                                 General Comments: "I don't feel like myself", HOH        Exercises     Shoulder Instructions       General Comments Pt reports that he is constantly spinning "like on a merry-go-round" no matter what position he is in.     Pertinent Vitals/ Pain       Pain Assessment: Faces Faces Pain Scale: Hurts a little bit Pain Location: generalized Pain Descriptors /  Indicators: Grimacing Pain Intervention(s): Monitored during session;Limited activity within patient's tolerance;Repositioned  Home Living                                          Prior Functioning/Environment              Frequency  Min 2X/week        Progress Toward Goals  OT Goals(current goals can now be found in the care plan section)  Progress towards OT goals: Not progressing toward goals - comment(limited by dizziness)  Acute Rehab OT Goals Patient Stated Goal: To feel better OT Goal Formulation: With patient Time For Goal Achievement: 04/19/18 Potential to Achieve Goals: [place] Discharge plan remains appropriate;Frequency remains appropriate    Co-evaluation                 AM-PAC OT "6 Clicks"  Daily Activity     Outcome Measure   Help from another person eating meals?: None Help from another person taking care of personal grooming?: A Little Help from another person toileting, which includes using toliet, bedpan, or urinal?: A Lot Help from another person bathing (including washing, rinsing, drying)?: A Lot Help from another person to put on and taking off regular upper body clothing?: A Little Help from another person to put on and taking off regular lower body clothing?: A Lot 6 Click Score: 16    End of Session    OT Visit Diagnosis: Muscle weakness (generalized) (M62.81);Unsteadiness on feet (R26.81)   Activity Tolerance Other (comment)(limited by dizziness)   Patient Left in bed;with call bell/phone within reach;with bed alarm set   Nurse Communication Mobility status        Time: 1740-8144 OT Time Calculation (min): 16 min  Charges: OT General Charges $OT Visit: 1 Visit OT Treatments $Self Care/Home Management : 8-22 mins  Hulda Humphrey OTR/L Acute Rehabilitation Services Pager: [phone] Office: [place] 04/07/2018, 5:27 PM

## 2018-04-07 NOTE — Progress Notes (Signed)
PROGRESS NOTE    Harry [name]  KDX:[id number] DOB: [birth year] DOA: 04/03/2018 PCP: [name], MD   Brief Narrative:  HPI per Dr. Shela Leff on 04/03/2018 Harry [name] is a 73 y.o. male with medical history significant of CKD 3, hypertension, hyperlipidemia, PVD status post atherectomy, type 2 diabetes, CVA presenting to the hospital via EMS for evaluation of fall and rib pain.  Per son at bedside, patient has a history of recurrent falls secondary to his blood pressure dropping.  States patient was walking this morning lost his balance and fell.  Again this evening he fell while walking and has been complaining of left-sided rib pain since then.  Patient states he has been dizzy for the past 3 weeks.  Denies having any chest pain or shortness of breath prior to the falls.  Family states patient did not lose consciousness.  They did not notice any seizure-like activity today.   **Was again Orthostatic again yesterday and repeat pending today. PT evaluated and recommending SNF; Will continue IVF and place TED hose; Will be Given a 1500 mL Bolus today given Orthostatic Hypotension again and significant drop from sitting to standing.  Hospitalization has also been complicated by hypoglycemia.  Assessment & Plan:   Principal Problem:   Orthostatic hypotension Active Problems:   Essential hypertension   CKD (chronic kidney disease), stage III (HCC)   Falls   Hypokalemia  Recurrent Falls; Orthostatic Hypotension -Orthostatics positive in the ED and again today specifically going from sitting to Standing  -Infectious etiology less likely as patient has afebrile and has no leukocytosis.  Chest x-ray not suggestive of pneumonia. -Head CT and CT C-spine negative for acute abnormality. -May Consider obtaining MRI if still dizzy and Orthostasis is improved but will hold off for now -Patient denies having any chest pain.  EKG without acute changes. Checked Troponin and was  <0.03 -Cardiac monitoring -IVF Hydration discontinued but will bolus 1.5 Liters today  -Repeat orthostatics this morning showed that patient dropped again  -Will try TED Hose and add an Abdominal Binder -Continue home fludrocortisone 0.1 mg po Daily but may need to increase dose -PT evaluation recommending SNF and will D/C when more stable to D/C   Left-Sided rib pain secondary to fall -Chest x-ray without evidence of fracture "as the visualized skeletal structures are unremarkable." -C/w Acetaminophen 650 mg po q6hprn Mild Pain  Hypertension -Blood pressure had remained elevated but improved yesterday but is now 134/77 -C/w 5 mg of Hydralazine q4hprn SBP >180  Normocytic Anemia/Anemia of Chronic Kidney Disease -Stable.  Hemoglobin 10.3 on admission, at recent baseline. -Hb/Hct now is 9.4/30.0 -Check Anemia Panel in the AM -Continue to Monitor for S/Sx of Bleeding  -Repeat CBC in AM   AKI on CKD 3 -Creatinine 2.1 on Admission, recent baseline 1.8-2.0. -IV fluid hydration is now stopped but will bolus 1.5 Liters  -Avoid nephrotoxic agents/contrast and avoid Hypotension -BUN/Cr improved to 12/1.38 but slightly worsened to 15/1.49 -Continue to Monitor and trend Renal Function -Repeat CMP in AM    Peripheral Vascular Disease/PAD -Continue home Aspirin 81 mg po Daily and Clopidogrel 75 mg po Daily   Hyperlipidemia -Continue home Rosuvastatin 5 mg po Daily   Depression -Continue home Sertraline 25 mg po Daily   Type 2 Diabetes Mellitus complicated by Hypoglycemia -CBG 177 on Admission. -Checked A1c and was 7.1 -Continued home Basaglar but decreased dose from 30 units at bedtime to 14 units sq qHS due to Hypoglycemia again this AM  -  Placed on Sensitive Novolog SSI AC -Continue to Monitor CBG's; CBG's had been ranging from 172-243 but since he was hypoglycemic this AM they ware now ranging from 71-199  Hypokalemia -Patient's K+ was 3.8 -Not on home diuretic.  Patient  is not complaining of vomiting or diarrhea. -Continue to Monitor and Replete as Necessary -Repeat Mag Level in AM   Hypophosphatemia -Patient's Phos Level was 2.8 -Continue to Monitor and Replete as Necessary -Repeat Phos Level in AM   Hypomagnesemia -Patient's Mag Level was 1.8 -Continue to Monitor and Replete as Necessary -Repeat Mag Level in AM   DVT prophylaxis: Enoxaparin 40 mg sq Daily  Code Status: FULL CODE Family Communication: Discussed with Daughter at bedside Disposition Plan: D/C to SNF when Orthostatic Hypotension is improved  Consultants:   None   Procedures: None   Antimicrobials:  Anti-infectives (From admission, onward)   None     Subjective: Seen and examined at bedside and was more alert and sitting in the chair at bedside eating breakfast. No nausea or vomiting. Still Orthostatic this AM. Rib pain has improved. Daughter concerned about going to SNF still orthostatic but I reassured her we will make sure he is stable prior to D/C. No other concerns or complaints at this time.   Objective: Vitals:   04/06/18 1423 04/06/18 1432 04/06/18 2008 04/07/18 0300  BP: 103/83 91/60 (!) 180/91 134/77  Pulse: 89 95 81 81  Resp:  16    Temp:  (!) 97.3 F (36.3 C) 99.5 F (37.5 C) (!) 97.5 F (36.4 C)  TempSrc:   Axillary Axillary  SpO2:   99% 100%  Weight:      Height:        Intake/Output Summary (Last 24 hours) at 04/07/2018 1144 Last data filed at 04/07/2018 0900 Gross per 24 hour  Intake 240 ml  Output -  Net 240 ml   Filed Weights   04/03/18 2004 04/05/18 1300  Weight: 79.4 kg 79.9 kg   Examination: Physical Exam:  Constitutional: WN/WD slightly overweight AAM in NAD sitting in chair bedside and appears comfortable and eating lunch Eyes: Lids and conjunctivae are normal. Sclerae anicteric  ENMT: External Ears and nose appear normal. Slightly hard of hearing as he has cotton balls in his ears today Neck: Supple with no JVD Respiratory:  Diminished slightly with no appreciable wheezing/rales/rhonchi. Unlabored breathing and not using any accessory muscles to breathe Cardiovascular: RRR; No appreciable m/r/g. No LE edema and is wearing TED Hose Abdomen: Soft, NT, ND. Bowel sounds present GU: Deferred Musculoskeletal: No contractures; No cyanosis. No joint deformities noted Skin: Warm and Dry. No appreciable rashes or lesions on a limited skin evaluation Neurologic: CN 2-12 grossly intact with appreciable focal deficits. Psychiatric: Normal mood and affect. Intact judgment and insight. He is awake, alert, and oriented x3  Data Reviewed: I have personally reviewed following labs and imaging studies  CBC: Recent Labs  Lab 04/03/18 2048 04/04/18 0935 04/05/18 0630 04/06/18 0353 04/07/18 0416  WBC 5.3 4.7 4.8 5.4 5.0  NEUTROABS 2.9 2.4 2.6 2.6 2.4  HGB 10.3* 9.5* 9.2* 9.4* 9.4*  HCT 33.0* 28.1* 28.8* 29.9* 30.0*  MCV 92.4 89.5 90.6 92.0 91.7  PLT 225 187 201 195 659   Basic Metabolic Panel: Recent Labs  Lab 04/04/18 0036 04/04/18 0356 04/04/18 0935 04/05/18 0630 04/06/18 0353 04/07/18 0416  NA  --  142 141 143 143 142  K  --  3.2* 3.3* 3.0* 3.5 3.8  CL  --  108 108 112* 110 109  CO2  --  26 24 23 22 23   GLUCOSE  --  251* 190* 57* 95 108*  BUN  --  24* 22 14 12 15   CREATININE  --  1.77* 1.56* 1.47* 1.38* 1.49*  CALCIUM  --  8.2* 8.3* 7.9* 8.1* 8.3*  MG 1.6*  --  1.5* 1.6* 1.7 1.8  PHOS  --   --  2.4* 2.5 4.3 2.8   GFR: Estimated Creatinine Clearance: 45.5 mL/min (A) (by C-G formula based on SCr of 1.49 mg/dL (H)). Liver Function Tests: Recent Labs  Lab 04/03/18 2048 04/04/18 0935 04/05/18 0630 04/06/18 0353 04/07/18 0416  AST 17 14* 14* 14* 16  ALT 14 11 11 10 13   ALKPHOS 54 49 46 43 46  BILITOT 0.7 0.9 0.8 0.7 0.8  PROT 7.3 6.3* 6.5 6.4* 6.5  ALBUMIN 3.5 3.0* 3.0* 3.0* 3.1*   No results for input(s): LIPASE, AMYLASE in the last 168 hours. No results for input(s): AMMONIA in the last 168  hours. Coagulation Profile: No results for input(s): INR, PROTIME in the last 168 hours. Cardiac Enzymes: Recent Labs  Lab 04/04/18 0036  TROPONINI <0.03   BNP (last 3 results) No results for input(s): PROBNP in the last 8760 hours. HbA1C: No results for input(s): HGBA1C in the last 72 hours. CBG: Recent Labs  Lab 04/06/18 0836 04/06/18 1207 04/06/18 1648 04/06/18 2141 04/07/18 0659  GLUCAP 199* 101* 98 92 71   Lipid Profile: No results for input(s): CHOL, HDL, LDLCALC, TRIG, CHOLHDL, LDLDIRECT in the last 72 hours. Thyroid Function Tests: Recent Labs    04/05/18 0630  TSH 2.135   Anemia Panel: No results for input(s): VITAMINB12, FOLATE, FERRITIN, TIBC, IRON, RETICCTPCT in the last 72 hours. Sepsis Labs: No results for input(s): PROCALCITON, LATICACIDVEN in the last 168 hours.  No results found for this or any previous visit (from the past 240 hour(s)).    Radiology Studies: No results found. Scheduled Meds: . aspirin EC  81 mg Oral Daily  . carbamide peroxide  5 drop Both EARS BID  . clopidogrel  75 mg Oral Daily  . enoxaparin (LOVENOX) injection  40 mg Subcutaneous Daily  . fludrocortisone  0.1 mg Oral Daily  . insulin aspart  0-9 Units Subcutaneous TID WC  . insulin glargine  14 Units Subcutaneous QHS  . rosuvastatin  5 mg Oral Daily  . sertraline  25 mg Oral Daily   Continuous Infusions: . sodium chloride       LOS: 3 days   Kerney Elbe, DO [facility] PAGER is on AMION  If 7PM-7AM, please contact night-coverage [email or web address] Password [email or web address] 04/07/2018, 11:44 AM

## 2018-04-07 NOTE — Plan of Care (Signed)
  Problem: Clinical Measurements: Goal: Will remain free from infection Outcome: Progressing   Problem: Clinical Measurements: Goal: Cardiovascular complication will be avoided Outcome: Progressing   Problem: Coping: Goal: Level of anxiety will decrease Outcome: Progressing   Problem: Elimination: Goal: Will not experience complications related to bowel motility Outcome: Progressing

## 2018-04-07 NOTE — Progress Notes (Signed)
CSW received phone call from patient daughter regarding concerns in that patient's BP has been unstable during PT sessions. Patient daughter asked what other options for discharge there are, CSW explained SNF vs. Home Health in which daughter is in agreement patient will need SNF. Patient is set to discharge to [facility] when medically ready. Patient daughter reassured regarding [facility] having patient chart access to be up to date on BP stats and treatment. Daughter asks to be notified when it is clear if patient will dc today or not.   [name], [name]

## 2018-04-08 LAB — CBC WITH DIFFERENTIAL/PLATELET
Abs Immature Granulocytes: 0.01 10*3/uL (ref 0.00–0.07)
Basophils Absolute: 0 10*3/uL (ref 0.0–0.1)
Basophils Relative: 1 %
Eosinophils Absolute: 0.4 10*3/uL (ref 0.0–0.5)
Eosinophils Relative: 7 %
HCT: 28.1 % — ABNORMAL LOW (ref 39.0–52.0)
Hemoglobin: 9.3 g/dL — ABNORMAL LOW (ref 13.0–17.0)
Immature Granulocytes: 0 %
Lymphocytes Relative: 37 %
Lymphs Abs: 1.9 10*3/uL (ref 0.7–4.0)
MCH: 30.5 pg (ref 26.0–34.0)
MCHC: 33.1 g/dL (ref 30.0–36.0)
MCV: 92.1 fL (ref 80.0–100.0)
Monocytes Absolute: 0.5 10*3/uL (ref 0.1–1.0)
Monocytes Relative: 11 %
Neutro Abs: 2.3 10*3/uL (ref 1.7–7.7)
Neutrophils Relative %: 44 %
Platelets: 217 10*3/uL (ref 150–400)
RBC: 3.05 MIL/uL — ABNORMAL LOW (ref 4.22–5.81)
RDW: 14.9 % (ref 11.5–15.5)
WBC: 5.1 10*3/uL (ref 4.0–10.5)
nRBC: 0 % (ref 0.0–0.2)

## 2018-04-08 LAB — RETICULOCYTES
Immature Retic Fract: 18.2 % — ABNORMAL HIGH (ref 2.3–15.9)
RBC.: 3.05 MIL/uL — ABNORMAL LOW (ref 4.22–5.81)
Retic Count, Absolute: 64.7 10*3/uL (ref 19.0–186.0)
Retic Ct Pct: 2.1 % (ref 0.4–3.1)

## 2018-04-08 LAB — COMPREHENSIVE METABOLIC PANEL
ALT: 17 U/L (ref 0–44)
AST: 19 U/L (ref 15–41)
Albumin: 2.9 g/dL — ABNORMAL LOW (ref 3.5–5.0)
Alkaline Phosphatase: 51 U/L (ref 38–126)
Anion gap: 7 (ref 5–15)
BUN: 21 mg/dL (ref 8–23)
CO2: 24 mmol/L (ref 22–32)
Calcium: 8.3 mg/dL — ABNORMAL LOW (ref 8.9–10.3)
Chloride: 110 mmol/L (ref 98–111)
Creatinine, Ser: 1.65 mg/dL — ABNORMAL HIGH (ref 0.61–1.24)
GFR calc Af Amer: 48 mL/min — ABNORMAL LOW (ref >60)
GFR calc non Af Amer: 41 mL/min — ABNORMAL LOW (ref >60)
Glucose, Bld: 164 mg/dL — ABNORMAL HIGH (ref 70–99)
Potassium: 3.6 mmol/L (ref 3.5–5.1)
Sodium: 141 mmol/L (ref 135–145)
Total Bilirubin: 0.8 mg/dL (ref 0.3–1.2)
Total Protein: 6.3 g/dL — ABNORMAL LOW (ref 6.5–8.1)

## 2018-04-08 LAB — GLUCOSE, CAPILLARY
Glucose-Capillary: 115 mg/dL — ABNORMAL HIGH (ref 70–99)
Glucose-Capillary: 135 mg/dL — ABNORMAL HIGH (ref 70–99)
Glucose-Capillary: 160 mg/dL — ABNORMAL HIGH (ref 70–99)
Glucose-Capillary: 90 mg/dL (ref 70–99)

## 2018-04-08 LAB — IRON AND TIBC
Iron: 28 ug/dL — ABNORMAL LOW (ref 45–182)
Saturation Ratios: 11 % — ABNORMAL LOW (ref 17.9–39.5)
TIBC: 265 ug/dL (ref 250–450)
UIBC: 237 ug/dL

## 2018-04-08 LAB — VITAMIN B12: Vitamin B-12: 168 pg/mL — ABNORMAL LOW (ref 180–914)

## 2018-04-08 LAB — FOLATE: Folate: 9.2 ng/mL (ref >5.9)

## 2018-04-08 LAB — MAGNESIUM: Magnesium: 1.7 mg/dL (ref 1.7–2.4)

## 2018-04-08 LAB — PHOSPHORUS: Phosphorus: 3 mg/dL (ref 2.5–4.6)

## 2018-04-08 LAB — FERRITIN: Ferritin: 34 ng/mL (ref 24–336)

## 2018-04-08 MED ORDER — VITAMIN B-12 1000 MCG PO TABS
1000.0000 ug | ORAL_TABLET | Freq: Every day | ORAL | Status: DC
  Administered 2018-04-08 – 2018-04-14 (×7): 1000 ug via ORAL
  Filled 2018-04-08 (×7): qty 1

## 2018-04-08 MED ORDER — HYDRALAZINE HCL 20 MG/ML IJ SOLN
10.0000 mg | INTRAMUSCULAR | Status: DC | PRN
  Administered 2018-04-12 – 2018-04-13 (×2): 10 mg via INTRAVENOUS
  Filled 2018-04-08 (×2): qty 1

## 2018-04-08 MED ORDER — SODIUM CHLORIDE 0.9 % IV SOLN
INTRAVENOUS | Status: DC
  Administered 2018-04-08: 19:00:00 via INTRAVENOUS

## 2018-04-08 NOTE — Care Management Important Message (Signed)
Important Message  Patient Details  Name: Harry [name] MRN: [id number] Date of Birth: [birth year]   Medicare Important Message Given:  Yes    Orbie Pyo 04/08/2018, 8:22 AM

## 2018-04-08 NOTE — Plan of Care (Signed)
  Problem: Pain Managment: Goal: General experience of comfort will improve Outcome: Progressing   Problem: Safety: Goal: Ability to remain free from injury will improve Outcome: Progressing   Problem: Skin Integrity: Goal: Risk for impaired skin integrity will decrease Outcome: Progressing   

## 2018-04-08 NOTE — Progress Notes (Signed)
PROGRESS NOTE    Harry [name]  UVO:[id number] DOB: 08-Sep-1945 DOA: 04/03/2018 PCP: [name], MD   Brief Narrative:  HPI per Dr. Shela Leff on 04/03/2018 Harry [name] is a 73 y.o. male with medical history significant of CKD 3, hypertension, hyperlipidemia, PVD status post atherectomy, type 2 diabetes, CVA presenting to the hospital via EMS for evaluation of fall and rib pain.  Per son at bedside, patient has a history of recurrent falls secondary to his blood pressure dropping.  States patient was walking this morning lost his balance and fell.  Again this evening he fell while walking and has been complaining of left-sided rib pain since then.  Patient states he has been dizzy for the past 3 weeks.  Denies having any chest pain or shortness of breath prior to the falls.  Family states patient did not lose consciousness.  They did not notice any seizure-like activity today.   **Was again Orthostatic again yesterday and repeat pending today. PT evaluated and recommending SNF; Will continue IVF and place TED hose; Was Given a 1500 mL Bolus yesterday given Orthostatic Hypotension again and significant drop from sitting to standing. Repeat today has shown him to drop significant (60 points in Systolic BP from sitting to standing). Will place on NS at 75 mL/hr countinuous now. Hospitalization has also been complicated by hypoglycemia.  Assessment & Plan:   Principal Problem:   Orthostatic hypotension Active Problems:   Essential hypertension   CKD (chronic kidney disease), stage III (HCC)   Falls   Hypokalemia  Recurrent Falls; Orthostatic Hypotension -Orthostatics positive in the ED and again today specifically going from Sitting to Standing (60 point drop in Systolic BP!)  -Infectious etiology less likely as patient has afebrile and has no leukocytosis.  Chest x-ray not suggestive of pneumonia. -Head CT and CT C-spine negative for acute abnormality. -May Consider obtaining  MRI if still dizzy and Orthostasis is improved but will hold off for now given that he drops from sitting to standing  -Patient denies having any chest pain.  EKG without acute changes. Checked Troponin and was <0.03 -Continue Cardiac monitoring -IVF Hydration reinitiated; He was bolused 1.5 Liters yesterday  -Repeat orthostatics this morning showed that patient dropped again  -Will try TED Hose and add an Abdominal Binder; Abdominal Binder still has not been given -Consider giving Midodrine but patient has Midodrine in the past and per daughter caused him a lot of Urinary Symptoms. -Continue home fludrocortisone 0.1 mg po Daily but may need to increase dose -PT evaluation recommending SNF and will D/C when more stable to D/C   Left-Sided rib pain secondary to fall -Chest x-ray without evidence of fracture "as the visualized skeletal structures are unremarkable." -C/w Acetaminophen 650 mg po q6hprn Mild Pain  Hypertension -Blood pressure had remained elevated but improved yesterday to 134/77; Today it significantly worsened and was 210/103 laying down and then repeat this Afternoon was 178/90 -C/w 5 mg of Hydralazine q4hprn SBP >180  Normocytic Anemia/Anemia of Chronic Kidney Disease -Stable.  Hemoglobin 10.3 on admission, at recent baseline. -Hb/Hct now is 9.3/28.1 -Checked Anemia Panel this AM and showed iron level of 28, TIBC 237, TIBC 265, saturation ratios of 11%, ferritin level 34, folate level-2, vitamin B12 level 168 -We will start vitamin B12 supplementation with thousand micrograms p.o. daily -Continue to Monitor for S/Sx of Bleeding  -Repeat CBC in AM   AKI on CKD 3 -Creatinine 2.1 on Admission, recent baseline 1.8-2.0. -IV fluid hydration now  re-initiated due to worsening Renal Fxn -Avoid nephrotoxic agents/contrast and avoid Hypotension -BUN/Cr improved to 12/1.38 but slightly worsened to 15/1.49 and further worsened 21/1.65 -Continue to Monitor and trend Renal  Function -Repeat CMP in AM    Peripheral Vascular Disease/PAD -Continue home Aspirin 81 mg po Daily and Clopidogrel 75 mg po Daily   Hyperlipidemia -Continue home Rosuvastatin 5 mg po Daily   Depression -Continue home Sertraline 25 mg po Daily   Type 2 Diabetes Mellitus complicated by Hypoglycemia -CBG 177 on Admission. -Checked A1c and was 7.1 -Continued home Basaglar but decreased dose from 30 units at bedtime to 14 units sq qHS due to Hypoglycemia again this AM  -Placed on Sensitive Novolog SSI AC -Continue to Monitor CBG's; CBG's now ranging from 115-196  Hypokalemia -Patient's K+ was 3.6 -Not on home diuretic.  Patient is not complaining of vomiting or diarrhea. -Continue to Monitor and Replete as Necessary -Repeat Mag Level in AM   Hypophosphatemia -Patient's Phos Level was 3.0 -Continue to Monitor and Replete as Necessary -Repeat Phos Level in AM   Hypomagnesemia -Patient's Mag Level was 1.7 -Continue to Monitor and Replete as Necessary -Repeat Mag Level in AM   DVT prophylaxis: Enoxaparin 40 mg sq Daily  Code Status: FULL CODE Family Communication: Discussed with Daughter at bedside Disposition Plan: D/C to SNF when Orthostatic Hypotension is improved and no longer symptomatic   Consultants:   None   Procedures: None   Antimicrobials:  Anti-infectives (From admission, onward)   None     Subjective: Seen and examined at bedside in bed about to go to sleep.  Discussed with him that his blood pressure dropped significantly he states that he was extremely dizzy at that time.  No nausea or vomiting.  Updated daughter over the phone who states that this is been a significant issue for him and she is concerned that this will keep happening.  We will try abdominal binder and unclear why it was not started yesterday.  Will also place back on maintenance IV fluids today and patient is understandable agreeable.  No other concerns or plans at this time denies any  chest pain.  States that his ears are a little bit better after the Debrox.  Objective: Vitals:   04/08/18 0947 04/08/18 1057 04/08/18 1414 04/08/18 1424  BP: (!) 182/99 (!) 210/103 (!) 188/81 (!) 178/90  Pulse: 81  77   Resp:   16   Temp:   97.6 F (36.4 C)   TempSrc:   Oral   SpO2:   100%   Weight:      Height:        Intake/Output Summary (Last 24 hours) at 04/08/2018 1711 Last data filed at 04/08/2018 1641 Gross per 24 hour  Intake 480 ml  Output 1000 ml  Net -520 ml   Filed Weights   04/03/18 2004 04/05/18 1300  Weight: 79.4 kg 79.9 kg   Examination: Physical Exam:  Constitutional: Well-nourished, well-developed slightly overweight African-American male currently no acute distress laying in bed about to go to sleep appears calm and comfortable Eyes: Sclera anicteric.  Lids extract are normal ENMT: External ears and nose appear normal.  See normal hearing and states that his ears are feeling better Neck: Appears supple no JVD Respiratory: Slightly diminished auscultation bilaterally no appreciable wheezing, rales, rhonchi.  Patient has unlabored breathing is not using any accessory muscles to breathe Cardiovascular: Regular rate and rhythm.  No appreciable murmurs, rubs, gallops.  Has no extremity edema  noted and he is wearing his TED hose Abdomen: Soft, nontender, nondistended bowel sounds present GU: Deferred Musculoskeletal: No contractures or cyanosis.  No joint formerly noted Skin: Skin is warm and dry no appreciable rashes or lesions on physical evaluation Neurologic: Cranial nerves II through XII grossly intact no appreciable focal deficits Psychiatric: Normal pleasant mood and affect.  Intact judgment insight.  He is awake alert and oriented x3  Data Reviewed: I have personally reviewed following labs and imaging studies  CBC: Recent Labs  Lab 04/04/18 0935 04/05/18 0630 04/06/18 0353 04/07/18 0416 04/08/18 0354  WBC 4.7 4.8 5.4 5.0 5.1  NEUTROABS 2.4 2.6  2.6 2.4 2.3  HGB 9.5* 9.2* 9.4* 9.4* 9.3*  HCT 28.1* 28.8* 29.9* 30.0* 28.1*  MCV 89.5 90.6 92.0 91.7 92.1  PLT 187 201 195 207 387   Basic Metabolic Panel: Recent Labs  Lab 04/04/18 0935 04/05/18 0630 04/06/18 0353 04/07/18 0416 04/08/18 0354  NA 141 143 143 142 141  K 3.3* 3.0* 3.5 3.8 3.6  CL 108 112* 110 109 110  CO2 24 23 22 23 24   GLUCOSE 190* 57* 95 108* 164*  BUN 22 14 12 15 21   CREATININE 1.56* 1.47* 1.38* 1.49* 1.65*  CALCIUM 8.3* 7.9* 8.1* 8.3* 8.3*  MG 1.5* 1.6* 1.7 1.8 1.7  PHOS 2.4* 2.5 4.3 2.8 3.0   GFR: Estimated Creatinine Clearance: 41.1 mL/min (A) (by C-G formula based on SCr of 1.65 mg/dL (H)). Liver Function Tests: Recent Labs  Lab 04/04/18 0935 04/05/18 0630 04/06/18 0353 04/07/18 0416 04/08/18 0354  AST 14* 14* 14* 16 19  ALT 11 11 10 13 17   ALKPHOS 49 46 43 46 51  BILITOT 0.9 0.8 0.7 0.8 0.8  PROT 6.3* 6.5 6.4* 6.5 6.3*  ALBUMIN 3.0* 3.0* 3.0* 3.1* 2.9*   No results for input(s): LIPASE, AMYLASE in the last 168 hours. No results for input(s): AMMONIA in the last 168 hours. Coagulation Profile: No results for input(s): INR, PROTIME in the last 168 hours. Cardiac Enzymes: Recent Labs  Lab 04/04/18 0036  TROPONINI <0.03   BNP (last 3 results) No results for input(s): PROBNP in the last 8760 hours. HbA1C: No results for input(s): HGBA1C in the last 72 hours. CBG: Recent Labs  Lab 04/07/18 1657 04/07/18 2209 04/08/18 0608 04/08/18 1111 04/08/18 1629  GLUCAP 127* 196* 115* 135* 160*   Lipid Profile: No results for input(s): CHOL, HDL, LDLCALC, TRIG, CHOLHDL, LDLDIRECT in the last 72 hours. Thyroid Function Tests: No results for input(s): TSH, T4TOTAL, FREET4, T3FREE, THYROIDAB in the last 72 hours. Anemia Panel: Recent Labs    04/08/18 0354  VITAMINB12 168*  FOLATE 9.2  FERRITIN 34  TIBC 265  IRON 28*  RETICCTPCT 2.1   Sepsis Labs: No results for input(s): PROCALCITON, LATICACIDVEN in the last 168 hours.  No results  found for this or any previous visit (from the past 240 hour(s)).    Radiology Studies: No results found. Scheduled Meds: . aspirin EC  81 mg Oral Daily  . carbamide peroxide  5 drop Both EARS BID  . clopidogrel  75 mg Oral Daily  . enoxaparin (LOVENOX) injection  40 mg Subcutaneous Daily  . fludrocortisone  0.1 mg Oral Daily  . insulin aspart  0-9 Units Subcutaneous TID WC  . insulin glargine  14 Units Subcutaneous QHS  . rosuvastatin  5 mg Oral Daily  . sertraline  25 mg Oral Daily   Continuous Infusions:    LOS: 4 days  Kerney Elbe, DO [facility] PAGER is on AMION  If 7PM-7AM, please contact night-coverage [email or web address] Password [email or web address] 04/08/2018, 5:11 PM

## 2018-04-08 NOTE — Plan of Care (Signed)

## 2018-04-09 LAB — GLUCOSE, CAPILLARY
Glucose-Capillary: 76 mg/dL (ref 70–99)
Glucose-Capillary: 382 mg/dL — ABNORMAL HIGH (ref 70–99)
Glucose-Capillary: 25 mg/dL — CL (ref 70–99)
Glucose-Capillary: 108 mg/dL — ABNORMAL HIGH (ref 70–99)
Glucose-Capillary: 24 mg/dL — CL (ref 70–99)
Glucose-Capillary: 169 mg/dL — ABNORMAL HIGH (ref 70–99)
Glucose-Capillary: 191 mg/dL — ABNORMAL HIGH (ref 70–99)
Glucose-Capillary: 229 mg/dL — ABNORMAL HIGH (ref 70–99)

## 2018-04-09 LAB — COMPREHENSIVE METABOLIC PANEL
ALT: 15 U/L (ref 0–44)
AST: 16 U/L (ref 15–41)
Albumin: 2.9 g/dL — ABNORMAL LOW (ref 3.5–5.0)
Alkaline Phosphatase: 45 U/L (ref 38–126)
Anion gap: 9 (ref 5–15)
BUN: 18 mg/dL (ref 8–23)
CO2: 24 mmol/L (ref 22–32)
Calcium: 8.4 mg/dL — ABNORMAL LOW (ref 8.9–10.3)
Chloride: 105 mmol/L (ref 98–111)
Creatinine, Ser: 1.52 mg/dL — ABNORMAL HIGH (ref 0.61–1.24)
GFR calc Af Amer: 53 mL/min — ABNORMAL LOW (ref >60)
GFR calc non Af Amer: 45 mL/min — ABNORMAL LOW (ref >60)
Glucose, Bld: 96 mg/dL (ref 70–99)
Potassium: 3.4 mmol/L — ABNORMAL LOW (ref 3.5–5.1)
Sodium: 138 mmol/L (ref 135–145)
Total Bilirubin: 0.8 mg/dL (ref 0.3–1.2)
Total Protein: 6.2 g/dL — ABNORMAL LOW (ref 6.5–8.1)

## 2018-04-09 LAB — CBC WITH DIFFERENTIAL/PLATELET
Abs Immature Granulocytes: 0.01 10*3/uL (ref 0.00–0.07)
Basophils Absolute: 0 10*3/uL (ref 0.0–0.1)
Basophils Relative: 1 %
Eosinophils Absolute: 0.4 10*3/uL (ref 0.0–0.5)
Eosinophils Relative: 8 %
HCT: 28.5 % — ABNORMAL LOW (ref 39.0–52.0)
Hemoglobin: 9 g/dL — ABNORMAL LOW (ref 13.0–17.0)
Immature Granulocytes: 0 %
Lymphocytes Relative: 38 %
Lymphs Abs: 2 10*3/uL (ref 0.7–4.0)
MCH: 29.1 pg (ref 26.0–34.0)
MCHC: 31.6 g/dL (ref 30.0–36.0)
MCV: 92.2 fL (ref 80.0–100.0)
Monocytes Absolute: 0.7 10*3/uL (ref 0.1–1.0)
Monocytes Relative: 13 %
Neutro Abs: 2.1 10*3/uL (ref 1.7–7.7)
Neutrophils Relative %: 40 %
Platelets: 222 10*3/uL (ref 150–400)
RBC: 3.09 MIL/uL — ABNORMAL LOW (ref 4.22–5.81)
RDW: 14.7 % (ref 11.5–15.5)
WBC: 5.3 10*3/uL (ref 4.0–10.5)
nRBC: 0 % (ref 0.0–0.2)

## 2018-04-09 LAB — MAGNESIUM: Magnesium: 1.5 mg/dL — ABNORMAL LOW (ref 1.7–2.4)

## 2018-04-09 LAB — PHOSPHORUS: Phosphorus: 2.9 mg/dL (ref 2.5–4.6)

## 2018-04-09 MED ORDER — BASAGLAR KWIKPEN 100 UNIT/ML ~~LOC~~ SOPN: 14.0000 [IU] | PEN_INJECTOR | Freq: Every day | SUBCUTANEOUS | Status: DC

## 2018-04-09 MED ORDER — INSULIN ASPART 100 UNIT/ML ~~LOC~~ SOLN: 0.0000 [IU] | Freq: Three times a day (TID) | SUBCUTANEOUS | Status: DC

## 2018-04-09 MED ORDER — POTASSIUM CHLORIDE CRYS ER 20 MEQ PO TBCR
40.0000 meq | EXTENDED_RELEASE_TABLET | Freq: Once | ORAL | Status: AC
  Administered 2018-04-09: 40 meq via ORAL
  Filled 2018-04-09: qty 2

## 2018-04-09 MED ORDER — CYANOCOBALAMIN 1000 MCG PO TABS: 1000.0000 ug | ORAL_TABLET | Freq: Every day | ORAL | Status: DC

## 2018-04-09 MED ORDER — GLUCOSE 40 % PO GEL: 2.0000 | ORAL | Status: AC

## 2018-04-09 MED ORDER — MAGNESIUM SULFATE 4 GM/100ML IV SOLN
4.0000 g | Freq: Once | INTRAVENOUS | Status: AC
  Administered 2018-04-09: 4 g via INTRAVENOUS
  Filled 2018-04-09: qty 100

## 2018-04-09 MED ORDER — GLUCOSE 40 % PO GEL
ORAL | Status: AC
  Administered 2018-04-09: 37.5 g
  Filled 2018-04-09: qty 1

## 2018-04-09 NOTE — Discharge Summary (Signed)
Physician Discharge Summary  Harry [name] YPP:[id number] DOB: 22-Aug-1945 DOA: 04/03/2018  PCP: [name], MD  Admit date: 04/03/2018 Discharge date: 04/09/2018  Time spent: 45 minutes  Recommendations for Outpatient Follow-up:  Patient will be discharged to skilled nursing facility. Continue physical and occupational therapy.  Patient will need to follow up with primary care provider within one week of discharge, repeat BMP and magnesium.  Patient should continue medications as prescribed.  Patient should follow a heart healthy/carb modified diet.   Discharge Diagnoses:  Principal Problem: Recurrent falls/orthostatic hypotension Left-sided rib pain secondary to fall Essential hypertension Normocytic anemia/anemia of chronic kidney disease Acute kidney injury on chronic kidney disease, stage III Peripheral vascular disease/PAD Hyperlipidemia Depression Diabetes mellitus, type II Hypokalemia Hypophosphatemia Hypomagnesemia Dementia  Discharge Condition: Stable  Diet recommendation: Healthy/carb modified  Filed Weights   04/03/18 2004 04/05/18 1300  Weight: 79.4 kg 79.9 kg    History of present illness:  On 04/03/2018 by Dr. Shela Leff Harry [name] is a 73 y.o. male with medical history significant of CKD 3, hypertension, hyperlipidemia, PVD status post atherectomy, type 2 diabetes, CVA presenting to the hospital via EMS for evaluation of fall and rib pain.  Per son at bedside, patient has a history of recurrent falls secondary to his blood pressure dropping.  States patient was walking this morning lost his balance and fell.  Again this evening he fell while walking and has been complaining of left-sided rib pain since then.  Patient states he has been dizzy for the past 3 weeks.  Denies having any chest pain or shortness of breath prior to the falls.  Family states patient did not lose consciousness.  They did not notice any seizure-like activity today.    Hospital Course:  Recurrent falls/orthostatic hypotension -Unknown etiology, possibly dysautonomia given his history of diabetes -CT head as well as CT C-spine unremarkable for acute abnormality -Chest x-ray reviewed, negative for infection -EKG without acute changes, troponin was also unremarkable -Patient does have a significant drop in systolic blood pressure from sitting to standing. -Despite IV fluid hydration, patient remains orthostatic -Continue Ted hose, abdominal binder, fludrocortisone -Patient was not able to tolerate midodrine in the past given urinary symptoms -Discussed with nephrology- feels this may be due to dysautonomia, may be worse when he eats and midodrine could be given at that time.  -Discussed this with daughter, it has not been associated with eating. Discussed referral to neurology for dysautonomia.  -PT and OT recommending SNF.  Patient requiring moderate assistance for sitting to standing transfers as well as pivoting due to generalized weakness and impaired balance.  Patient would benefit from further skilled physical therapy services to maximize independence and safety with mobility.  Left-sided rib pain secondary to fall -Continue Tylenol as needed  Essential hypertension -Patient is on fludrocortisone  Normocytic anemia/anemia of chronic kidney disease -Hemoglobin currently 9, appears to be close to baseline -Anemia panel showed iron 28, TIBC 265, ferritin 34, vitamin B12 168 (placed on supplementation)  Acute kidney injury on chronic kidney disease, stage III -Resolved -Baseline creatinine 1.8-2 -Creatinine currently 1.52  Peripheral vascular disease/PAD -Continue home medications, aspirin and Plavix -Follow-up with vascular surgery  Hyperlipidemia -Continue statin  Depression -Continue sertraline  Diabetes mellitus, type II -Hemoglobin A1c 7.1 -Patient has had some hypoglycemia, would continue home basal Klar at reduced dose along with  sliding insulin scale  Hypokalemia -Replaced -Repeat BMP in 1 week  Hypophosphatemia -resolved  Hypomagnesemia -replaced, repeat in one week  Dementia -Continue to follow-up with neurology as an outpatient  Procedures: None  Consultations: Nephrology, Dr. Hollie Salk, via phone  Discharge Exam:  04/09/18 0523  BP: (!) 164/91  Pulse: 77  Resp: 18  Temp: (!) 97.5 F (36.4 C)  SpO2: 100%     General: Well developed, well nourished, NAD, appears stated age  65: NCAT,mucous membranes moist.  Neck: Supple  Cardiovascular: S1 S2 auscultated, RRR  Respiratory: Clear to auscultation bilaterally with equal chest rise  Abdomen: Soft, nontender, nondistended, + bowel sounds  Extremities: warm dry without cyanosis clubbing or edema  Neuro: AAOx3, nonfocal  Psych: Does not, appropriate mood and affect  Discharge Instructions Discharge Instructions    Discharge instructions   Complete by:  As directed    Patient will be discharged to skilled nursing facility. Continue physical and occupational therapy.  Patient will need to follow up with primary care provider within one week of discharge, repeat BMP and magnesium. Discuss referral to neurologist specializing in dysautonomia. Patient should continue medications as prescribed.  Patient should follow a heart healthy/carb modified diet.     Allergies as of 04/09/2018      Reactions   Lipitor [atorvastatin] Other (See Comments)   Myalgia   Statins Other (See Comments)   Myalgia (CAN tolerate Crestor, however)   Pravachol [pravastatin] Rash      Medication List    STOP taking these medications   divalproex 500 MG DR tablet Commonly known as:  DEPAKOTE     TAKE these medications   acetaminophen 500 MG tablet Commonly known as:  TYLENOL Take 1,000 mg by mouth every 6 (six) hours as needed (fever, headaches, or pain).   ARTIFICIAL TEARS 1.4 % ophthalmic solution Generic drug:  polyvinyl alcohol Place 1 drop into  both eyes 2 (two) times daily as needed for dry eyes.   aspirin 81 MG EC tablet Take 1 tablet (81 mg total) by mouth daily.   BASAGLAR KWIKPEN 100 UNIT/ML Sopn Inject 0.14 mLs (14 Units total) into the skin at bedtime. What changed:  how much to take   clopidogrel 75 MG tablet Commonly known as:  PLAVIX Take 1 tablet (75 mg total) by mouth daily.   cyanocobalamin 1000 MCG tablet Take 1 tablet (1,000 mcg total) by mouth daily. Start taking on:  April 10, 2018   fludrocortisone 0.1 MG tablet Commonly known as:  FLORINEF Take 1 tablet (0.1 mg total) by mouth daily.   insulin aspart 100 UNIT/ML injection Commonly known as:  novoLOG Inject 0-9 Units into the skin 3 (three) times daily with meals. Sliding scale  CBG 70 - 120: 0 units  CBG 121 - 150: 1 unit,   CBG 151 - 200: 2 units,   CBG 201 - 250: 3 units,   CBG 251 - 300: 5 units,   CBG 301 - 350: 7 units,   CBG 351 - 400: 9 units   rosuvastatin 5 MG tablet Commonly known as:  CRESTOR Take 1 tablet (5 mg total) by mouth daily.   sertraline 25 MG tablet Commonly known as:  ZOLOFT TAKE ONE TABLET BY MOUTH DAILY      Allergies  Allergen Reactions  . Lipitor [Atorvastatin] Other (See Comments)    Myalgia   . Statins Other (See Comments)    Myalgia (CAN tolerate Crestor, however)  . Pravachol [Pravastatin] Rash      The results of significant diagnostics from this hospitalization (including imaging, microbiology, ancillary and laboratory) are listed below for reference.  Significant Diagnostic Studies: Dg Chest 2 View  Result Date: 04/03/2018 CLINICAL DATA:  Patient fell today after standing up from bed. Dizziness. EXAM: CHEST - 2 VIEW COMPARISON:  November 15, 2017 FINDINGS: The heart size and mediastinal contours are within normal limits. Both lungs are clear. The visualized skeletal structures are unremarkable. IMPRESSION: No active cardiopulmonary disease. Electronically Signed   By: Dorise Bullion III M.D    On: 04/03/2018 21:41   Ct Head Wo Contrast  Result Date: 04/03/2018 CLINICAL DATA:  Initial evaluation for acute dizziness, fall. EXAM: CT HEAD WITHOUT CONTRAST CT CERVICAL SPINE WITHOUT CONTRAST TECHNIQUE: Multidetector CT imaging of the head and cervical spine was performed following the standard protocol without intravenous contrast. Multiplanar CT image reconstructions of the cervical spine were also generated. COMPARISON:  Prior CT from 02/25/2018 FINDINGS: CT HEAD FINDINGS Brain: Age-related cerebral atrophy with chronic microvascular ischemic disease. Remote left PCA territory infarct involving the left occipital lobe. Additional smaller remote right PCA territory infarct involving the right occipital lobe. Chronic bilateral basal ganglia and thalamic lacunar infarcts. Bilateral cerebellar infarcts, left greater than right. Appearance is stable from previous. No acute intracranial hemorrhage. No acute large vessel territory infarct. No mass lesion, midline shift or mass effect. No hydrocephalus. No extra-axial fluid collection. Vascular: No hyperdense vessel. Scattered vascular calcifications noted within the carotid siphons. Skull: Scalp soft tissues and calvarium within normal limits. Sinuses/Orbits: Globes and orbital soft tissues normal. Paranasal sinuses and mastoid air cells are clear. Other: None. CT CERVICAL SPINE FINDINGS Alignment: Straightening of the normal cervical lordosis. No listhesis or malalignment. Skull base and vertebrae: Skull base intact. Normal C1-2 articulations are preserved in the dens is intact. Vertebral body heights maintained. No acute fracture. Tiny osseous density adjacent to the superior articular process of the right C3 facet appears chronic in nature, likely degenerative. Soft tissues and spinal canal: Soft tissues of the neck demonstrate no acute finding. No abnormal prevertebral edema. Spinal canal within normal limits. Vascular calcifications about the carotid  bifurcations. Disc levels: Central disc protrusion at C3-4 with mild to moderate spinal stenosis. Left paracentral disc protrusion at C4-5 with moderate spinal stenosis. Additional mild disc bulging at C5-6 and C6-7. Upper chest: Visualized upper chest demonstrates no acute finding. Visualized lung apices are clear. Mild centrilobular emphysema noted. Other: None. IMPRESSION: CT BRAIN: 1. No acute intracranial abnormality. 2. Age-related cerebral atrophy with chronic small vessel ischemic disease with multifocal posterior ischemic infarcts as above, stable. CT CERVICAL SPINE: No acute traumatic injury within the cervical spine. Electronically Signed   By: Jeannine Boga M.D.   On: 04/03/2018 21:41   Ct Cervical Spine Wo Contrast  Result Date: 04/03/2018 CLINICAL DATA:  Initial evaluation for acute dizziness, fall. EXAM: CT HEAD WITHOUT CONTRAST CT CERVICAL SPINE WITHOUT CONTRAST TECHNIQUE: Multidetector CT imaging of the head and cervical spine was performed following the standard protocol without intravenous contrast. Multiplanar CT image reconstructions of the cervical spine were also generated. COMPARISON:  Prior CT from 02/25/2018 FINDINGS: CT HEAD FINDINGS Brain: Age-related cerebral atrophy with chronic microvascular ischemic disease. Remote left PCA territory infarct involving the left occipital lobe. Additional smaller remote right PCA territory infarct involving the right occipital lobe. Chronic bilateral basal ganglia and thalamic lacunar infarcts. Bilateral cerebellar infarcts, left greater than right. Appearance is stable from previous. No acute intracranial hemorrhage. No acute large vessel territory infarct. No mass lesion, midline shift or mass effect. No hydrocephalus. No extra-axial fluid collection. Vascular: No hyperdense vessel. Scattered vascular calcifications  noted within the carotid siphons. Skull: Scalp soft tissues and calvarium within normal limits. Sinuses/Orbits: Globes and  orbital soft tissues normal. Paranasal sinuses and mastoid air cells are clear. Other: None. CT CERVICAL SPINE FINDINGS Alignment: Straightening of the normal cervical lordosis. No listhesis or malalignment. Skull base and vertebrae: Skull base intact. Normal C1-2 articulations are preserved in the dens is intact. Vertebral body heights maintained. No acute fracture. Tiny osseous density adjacent to the superior articular process of the right C3 facet appears chronic in nature, likely degenerative. Soft tissues and spinal canal: Soft tissues of the neck demonstrate no acute finding. No abnormal prevertebral edema. Spinal canal within normal limits. Vascular calcifications about the carotid bifurcations. Disc levels: Central disc protrusion at C3-4 with mild to moderate spinal stenosis. Left paracentral disc protrusion at C4-5 with moderate spinal stenosis. Additional mild disc bulging at C5-6 and C6-7. Upper chest: Visualized upper chest demonstrates no acute finding. Visualized lung apices are clear. Mild centrilobular emphysema noted. Other: None. IMPRESSION: CT BRAIN: 1. No acute intracranial abnormality. 2. Age-related cerebral atrophy with chronic small vessel ischemic disease with multifocal posterior ischemic infarcts as above, stable. CT CERVICAL SPINE: No acute traumatic injury within the cervical spine. Electronically Signed   By: Jeannine Boga M.D.   On: 04/03/2018 21:41    Microbiology: No results found for this or any previous visit (from the past 240 hour(s)).   Labs: Basic Metabolic Panel: Recent Labs  Lab 04/05/18 0630 04/06/18 0353 04/07/18 0416 04/08/18 0354 04/09/18 0443  NA 143 143 142 141 138  K 3.0* 3.5 3.8 3.6 3.4*  CL 112* 110 109 110 105  CO2 23 22 23 24 24   GLUCOSE 57* 95 108* 164* 96  BUN 14 12 15 21 18   CREATININE 1.47* 1.38* 1.49* 1.65* 1.52*  CALCIUM 7.9* 8.1* 8.3* 8.3* 8.4*  MG 1.6* 1.7 1.8 1.7 1.5*  PHOS 2.5 4.3 2.8 3.0 2.9   Liver Function  Tests: Recent Labs  Lab 04/05/18 0630 04/06/18 0353 04/07/18 0416 04/08/18 0354 04/09/18 0443  AST 14* 14* 16 19 16   ALT 11 10 13 17 15   ALKPHOS 46 43 46 51 45  BILITOT 0.8 0.7 0.8 0.8 0.8  PROT 6.5 6.4* 6.5 6.3* 6.2*  ALBUMIN 3.0* 3.0* 3.1* 2.9* 2.9*   No results for input(s): LIPASE, AMYLASE in the last 168 hours. No results for input(s): AMMONIA in the last 168 hours. CBC: Recent Labs  Lab 04/05/18 0630 04/06/18 0353 04/07/18 0416 04/08/18 0354 04/09/18 0443  WBC 4.8 5.4 5.0 5.1 5.3  NEUTROABS 2.6 2.6 2.4 2.3 2.1  HGB 9.2* 9.4* 9.4* 9.3* 9.0*  HCT 28.8* 29.9* 30.0* 28.1* 28.5*  MCV 90.6 92.0 91.7 92.1 92.2  PLT 201 195 207 217 222   Cardiac Enzymes: Recent Labs  Lab 04/04/18 0036  TROPONINI <0.03   BNP: BNP (last 3 results) No results for input(s): BNP in the last 8760 hours.  ProBNP (last 3 results) No results for input(s): PROBNP in the last 8760 hours.  CBG: Recent Labs  Lab 04/08/18 1111 04/08/18 1629 04/08/18 2135 04/09/18 0635 04/09/18 0827  GLUCAP 135* 160* 90 76 382*       Signed:  Yussuf Sawyers  [facility] 04/09/2018, 11:14 AM

## 2018-04-09 NOTE — Clinical Social Work Placement (Signed)
   CLINICAL SOCIAL WORK PLACEMENT  NOTE  Date:  04/09/2018  Patient Details  Name: Harry [name] MRN: [id number] Date of Birth: Feb 16, 1946  Clinical Social Work is seeking post-discharge placement for this patient at the [place] level of care (*CSW will initial, date and re-position this form in  chart as items are completed):      Patient/family provided with [facility] Work Department's list of facilities offering this level of care within the geographic area requested by the patient (or if unable, by the patient's family).  Yes   Patient/family informed of their freedom to choose among providers that offer the needed level of care, that participate in Medicare, Medicaid or managed care program needed by the patient, have an available bed and are willing to accept the patient.      Patient/family informed of [facility]'s ownership interest in [facility] and [facility], as well as of the fact that they are under no obligation to receive care at these facilities.  PASRR submitted to EDS on       PASRR number received on 04/04/18     Existing PASRR number confirmed on       FL2 transmitted to all facilities in geographic area requested by pt/family on 04/04/18     FL2 transmitted to all facilities within larger geographic area on       Patient informed that his/her managed care company has contracts with or will negotiate with certain facilities, including the following:        Yes   Patient/family informed of bed offers received.  Patient chooses bed at [facility]     Physician recommends and patient chooses bed at      Patient to be transferred to [facility] on 04/09/18.  Patient to be transferred to facility by [facility]     Patient family notified on 04/09/18 of transfer.  Name of family member notified:  [name]     PHYSICIAN       Additional Comment:     _______________________________________________ Alberteen Sam, LCSW 04/09/2018, 2:08 PM

## 2018-04-09 NOTE — Progress Notes (Signed)
OT Cancellation Note  Patient Details Name: Harry [name] MRN: [id number] DOB: Mar 03, 1946  Pt sleeping upon entering room, awoke pt and assisted in him talking to his daughter on the phone. Pt refused any OOB mobility saying "you're bothering me". RN was alerted to pt's daughter requesting medical evaluation from team and insisting pt is having "an episode". Anticipating d/c today to SNF.   Cancelled Treatment:    Reason Eval/Treat Not Completed: Patient declined, no reason specified  Curtis Sites OTR/L 04/09/2018, 2:23 PM

## 2018-04-09 NOTE — Progress Notes (Signed)
CSW spoke with daughter who agreed for patient to dc to [facility], however moments after this communication daughter called CSW back to report concerns regarding the discharge in that she does not believe patient is medically ready as he is having "spells" when she was on the phone with patient.   CSW notified nurse and RNCM of patient's "spells".   Nurse, RNCM, and PT in room addressing concerns.   [place], [place]

## 2018-04-09 NOTE — Progress Notes (Signed)
Pt's daughter called to speak with her father while OT was in the room. Daughter overheard OT having a hard time arousing pt and was concerned. Pt's daughter was worried that her father was having a "spell" and that his blood sugar levels were low. OT then came to get me and explained the situation. Vital signs were taken, pt's blood sugar was 169 and blood pressure was 141/76. Pt stated he "felt fine" and was just trying to get some rest.

## 2018-04-09 NOTE — Progress Notes (Signed)
CSW received call from patient's daughter regarding discharge plan. CSW explained to daughter that LTAC facility was not an option as RNCM spoke with [facility] who reports he does not qualify. Patient's daughter reports she has concerns regarding SNF , patient's daughter is also refusing patient going home with home health. CSW attempted to explain there are no other options, daughter requested to speak to Dr. Ree Kida regarding discharge. CSW relayed message to Dr. Ree Kida.   [place], [place]

## 2018-04-09 NOTE — Discharge Instructions (Signed)

## 2018-04-09 NOTE — Plan of Care (Signed)
  Problem: Clinical Measurements: Goal: Ability to maintain clinical measurements within normal limits will improve Outcome: Progressing Goal: Will remain free from infection Outcome: Progressing Goal: Diagnostic test results will improve Outcome: Progressing Goal: Respiratory complications will improve Outcome: Progressing Goal: Cardiovascular complication will be avoided Outcome: Progressing   Problem: Activity: Goal: Risk for activity intolerance will decrease Outcome: Progressing   Problem: Nutrition: Goal: Adequate nutrition will be maintained Outcome: Progressing   Problem: Coping: Goal: Level of anxiety will decrease Outcome: Progressing   Problem: Elimination: Goal: Will not experience complications related to bowel motility Outcome: Progressing Goal: Will not experience complications related to urinary retention Outcome: Progressing   Problem: Pain Managment: Goal: General experience of comfort will improve Outcome: Progressing   Problem: Safety: Goal: Ability to remain free from injury will improve Outcome: Progressing   Problem: Skin Integrity: Goal: Risk for impaired skin integrity will decrease Outcome: Progressing   Problem: Clinical Measurements: Goal: Ability to maintain clinical measurements within normal limits will improve 04/09/2018 1129 by Youlanda Roys, RN Outcome: Progressing 04/09/2018 1129 by Youlanda Roys, RN Outcome: Progressing Goal: Will remain free from infection 04/09/2018 1129 by Youlanda Roys, RN Outcome: Progressing 04/09/2018 1129 by Youlanda Roys, RN Outcome: Progressing Goal: Diagnostic test results will improve 04/09/2018 1129 by Youlanda Roys, RN Outcome: Progressing 04/09/2018 1129 by Youlanda Roys, RN Outcome: Progressing Goal: Respiratory complications will improve 04/09/2018 1129 by Youlanda Roys, RN Outcome: Progressing 04/09/2018 1129 by Youlanda Roys, RN Outcome: Progressing Goal: Cardiovascular complication will be  avoided 04/09/2018 1129 by Youlanda Roys, RN Outcome: Progressing 04/09/2018 1129 by Youlanda Roys, RN Outcome: Progressing   Problem: Activity: Goal: Risk for activity intolerance will decrease 04/09/2018 1129 by Youlanda Roys, RN Outcome: Progressing 04/09/2018 1129 by Youlanda Roys, RN Outcome: Progressing   Problem: Nutrition: Goal: Adequate nutrition will be maintained 04/09/2018 1129 by Youlanda Roys, RN Outcome: Progressing 04/09/2018 1129 by Youlanda Roys, RN Outcome: Progressing   Problem: Coping: Goal: Level of anxiety will decrease 04/09/2018 1129 by Youlanda Roys, RN Outcome: Progressing 04/09/2018 1129 by Youlanda Roys, RN Outcome: Progressing   Problem: Elimination: Goal: Will not experience complications related to bowel motility 04/09/2018 1129 by Youlanda Roys, RN Outcome: Progressing 04/09/2018 1129 by Youlanda Roys, RN Outcome: Progressing Goal: Will not experience complications related to urinary retention 04/09/2018 1129 by Youlanda Roys, RN Outcome: Progressing 04/09/2018 1129 by Youlanda Roys, RN Outcome: Progressing   Problem: Pain Managment: Goal: General experience of comfort will improve 04/09/2018 1129 by Youlanda Roys, RN Outcome: Progressing 04/09/2018 1129 by Youlanda Roys, RN Outcome: Progressing   Problem: Safety: Goal: Ability to remain free from injury will improve 04/09/2018 1129 by Youlanda Roys, RN Outcome: Progressing 04/09/2018 1129 by Youlanda Roys, RN Outcome: Progressing   Problem: Skin Integrity: Goal: Risk for impaired skin integrity will decrease 04/09/2018 1129 by Youlanda Roys, RN Outcome: Progressing 04/09/2018 1129 by Youlanda Roys, RN Outcome: Progressing

## 2018-04-09 NOTE — Progress Notes (Signed)
Physical Therapy Treatment Patient Details Name: Harry [name] MRN: [id number] DOB: Jul 13, 1945 Today's Date: 04/09/2018    History of Present Illness 73 y.o. male with medical history significant of CKD 3, hypertension, hyperlipidemia, PVD status post atherectomy, type 2 diabetes, CVA presenting to the hospital via EMS for evaluation of fall and rib pain.  Xray negative for rib fxs. Pt admitted for orthostatic hypotension.     PT Comments    Patient seen for mobility progression. Pt continues to c/o dizziness/wooziness and reports feeling dizzy even in supine. Orthostatic BPs below. Pt requires mod A for sit to stand transfer and mod A +2 for short distance gait training. RN present throughout session. Continue to progress as tolerated.   Orthostatic BP Supine 116/66 (81) pulse 83 Sitting 135/68 (83) pulse  90 Standing 88/58 (67) 90 After standing 3 minutes 112/66 (80) 101    Follow Up Recommendations  SNF;Supervision/Assistance - 24 hour     Equipment Recommendations  None recommended by PT    Recommendations for Other Services       Precautions / Restrictions Precautions Precautions: Fall Precaution Comments: orthostatic    Mobility  Bed Mobility Overal bed mobility: Needs Assistance Bed Mobility: Supine to Sit;Sit to Supine     Supine to sit: Min guard Sit to supine: Min guard   General bed mobility comments: min guard for safety   Transfers Overall transfer level: Needs assistance Equipment used: Rolling walker (2 wheeled) Transfers: Sit to/from Stand Sit to Stand: Mod assist         General transfer comment: assist to power  up into standing; cues for safe hand placement and for safe use of AD  Ambulation/Gait Ambulation/Gait assistance: Mod assist;+2 safety/equipment Gait Distance (Feet): (~ 8 ft total) Assistive device: Rolling walker (2 wheeled) Gait Pattern/deviations: Step-to pattern;Decreased step length - right;Decreased dorsiflexion -  right;Trunk flexed     General Gait Details: assistance for balance and managing RW; pt tends to push RW too far out and needs assistance and cues for safe use; pt with step to pattern due to difficulty advancing R LE   Stairs             Wheelchair Mobility    Modified Rankin (Stroke Patients Only)       Balance Overall balance assessment: Needs assistance Sitting-balance support: Single extremity supported;Feet supported Sitting balance-Leahy Scale: Fair     Standing balance support: Bilateral upper extremity supported;During functional activity Standing balance-Leahy Scale: Poor                              Cognition Arousal/Alertness: Awake/alert Behavior During Therapy: Flat affect Overall Cognitive Status: Within Functional Limits for tasks assessed                                        Exercises      General Comments General comments (skin integrity, edema, etc.): RN present throughout session; orthostatic BP taken; pt reports dizziness while supine but that it worsens with postural changes; after sitting a few minutes pt reports dizziness improved a little but upon standing he reports feeling woozy      Pertinent Vitals/Pain Pain Assessment: Faces Faces Pain Scale: No hurt    Home Living  Prior Function            PT Goals (current goals can now be found in the care plan section) Acute Rehab PT Goals Patient Stated Goal: To feel better Progress towards PT goals: Progressing toward goals    Frequency    Min 3X/week      PT Plan Current plan remains appropriate    Co-evaluation              AM-PAC PT "6 Clicks" Mobility   Outcome Measure  Help needed turning from your back to your side while in a flat bed without using bedrails?: A Little Help needed moving from lying on your back to sitting on the side of a flat bed without using bedrails?: A Little Help needed moving to  and from a bed to a chair (including a wheelchair)?: A Lot Help needed standing up from a chair using your arms (e.g., wheelchair or bedside chair)?: A Lot Help needed to walk in hospital room?: A Lot Help needed climbing 3-5 steps with a railing? : Total 6 Click Score: 13    End of Session Equipment Utilized During Treatment: Gait belt Activity Tolerance: Treatment limited secondary to medical complications (Comment)(orthostatic) Patient left: with call bell/phone within reach;in bed;with bed alarm set Nurse Communication: Mobility status PT Visit Diagnosis: Other abnormalities of gait and mobility (R26.89);Pain;Muscle weakness (generalized) (M62.81);Difficulty in walking, not elsewhere classified (R26.2)     Time: 2924-4628 PT Time Calculation (min) (ACUTE ONLY): 21 min  Charges:  $Gait Training: 8-22 mins                     Earney Navy, PTA Acute Rehabilitation Services Pager: [phone] Office: [phone]     Darliss Cheney 04/09/2018, 1:31 PM

## 2018-04-09 NOTE — Care Management (Signed)
Case manager has spoken with Raquel Sarna at [facility] to see if patient was a candidate for LTAC. Patient does not meet criieria. Patient's daughter is not comfortable with the decision to discharge or with the CM's explanation  concerning LTAC. CM has spoken with Dr. Ree Kida and she says patient is medically ready to discharge, she has spoken at length with patient's daughter [name]. CM also explained Medicare appeal process.

## 2018-04-09 NOTE — Progress Notes (Signed)
Hypoglycemic Event  CBG: (11:06) 22  Treatment: Given 2 cups of orange juice, ice cream and crackers, CBG at 11:44 was 24. Administered Glutose, encouraged to eat lunch.  Symptoms: Dizziness, clammy skin, weakness.  Follow-up CBG: Time:12:20 CBG Result:108  Possible Reasons for Event: Poor PO intake  Comments/MD notified: Dr. Ree Kida notified    [facility] Harry [name]

## 2018-04-10 LAB — BASIC METABOLIC PANEL
Anion gap: 11 (ref 5–15)
BUN: 16 mg/dL (ref 8–23)
CO2: 25 mmol/L (ref 22–32)
Calcium: 8.6 mg/dL — ABNORMAL LOW (ref 8.9–10.3)
Chloride: 104 mmol/L (ref 98–111)
Creatinine, Ser: 1.51 mg/dL — ABNORMAL HIGH (ref 0.61–1.24)
GFR calc Af Amer: 53 mL/min — ABNORMAL LOW (ref >60)
GFR calc non Af Amer: 46 mL/min — ABNORMAL LOW (ref >60)
Glucose, Bld: 119 mg/dL — ABNORMAL HIGH (ref 70–99)
Potassium: 3.5 mmol/L (ref 3.5–5.1)
Sodium: 140 mmol/L (ref 135–145)

## 2018-04-10 LAB — GLUCOSE, CAPILLARY
Glucose-Capillary: 170 mg/dL — ABNORMAL HIGH (ref 70–99)
Glucose-Capillary: 117 mg/dL — ABNORMAL HIGH (ref 70–99)
Glucose-Capillary: 157 mg/dL — ABNORMAL HIGH (ref 70–99)
Glucose-Capillary: 140 mg/dL — ABNORMAL HIGH (ref 70–99)

## 2018-04-10 NOTE — Care Management (Signed)
MEDICARE APPEAL  DC order written 04/09/2018.  Verified through [facility] appeal had been filed. [phone] Provided with claim number [id number] filed at 3:30pm 04/09/2018  Spoke w patient's daughter who filed claim, [name]. Reviewed [id number] and DND with Ms Rosanna Randy. Left copies in room, and provided originals to [employer] to fax to [facility].   Will await [employer]/ [employer] decision on appealApolonio Schneiders Daughter   [phone]

## 2018-04-10 NOTE — Progress Notes (Signed)
PROGRESS NOTE    Harry [name]  HAL:[id number] DOB: 1945/05/14 DOA: 04/03/2018 PCP: [name], MD   Brief Narrative:  HPI on 04/03/2018 by Dr. Iverson Alamin Brownis a 73 y.o.malewith medical history significant ofCKD 3, hypertension, hyperlipidemia, PVD status post atherectomy, type 2 diabetes, CVA presenting to the hospital via EMS for evaluation of fallandrib pain.Per son at bedside, patient has a history of recurrent falls secondary to his blood pressure dropping. States patient was walking this morning lost his balance and fell. Again this evening he fell while walking and has been complaining of left-sided rib pain since then. Patient states he has been dizzy for the past 3 weeks. Denies having any chest pain or shortness of breath prior to the falls.Family states patient did not lose consciousness. They did not notice any seizure-like activity today.  Interim history Admitted for recurrent falls and orthostatic hypotension.  Given IV fluid hydration however continues to be orthostatic.  Patient currently with TED hose as well as abdominal binder and fludrocortisone.  Patient medically stable for discharge as no other intervention can be offered.  Daughter is appealing discharge. Assessment & Plan   Recurrent falls/orthostatic hypotension -Unknown etiology, possibly dysautonomia given his history of diabetes -CT head as well as CT C-spine unremarkable for acute abnormality -Chest x-ray reviewed, negative for infection -EKG without acute changes, troponin was also unremarkable -Patient does have a significant drop in systolic blood pressure from sitting to standing. -Despite IV fluid hydration, patient remains orthostatic -Continue Ted hose, abdominal binder, fludrocortisone -Patient was not able to tolerate midodrine in the past given urinary symptoms -Discussed with nephrology- feels this may be due to dysautonomia, may be worse when he eats and  midodrine could be given at that time.  -Discussed this with daughter, it has not been associated with eating. Discussed referral to neurology for dysautonomia.  -PT and OT recommending SNF.  Patient requiring moderate assistance for sitting to standing transfers as well as pivoting due to generalized weakness and impaired balance.  Patient would benefit from further skilled physical therapy services to maximize independence and safety with mobility.  Left-sided rib pain secondary to fall -Continue Tylenol as needed  Essential hypertension -Patient is on fludrocortisone  Normocytic anemia/anemia of chronic kidney disease -Hemoglobin currently 9, appears to be close to baseline -Anemia panel showed iron 28, TIBC 265, ferritin 34, vitamin B12 168 (placed on supplementation)  Acute kidney injury on chronic kidney disease, stage III -Resolved -Baseline creatinine 1.8-2 -Creatinine currently 1.52  Peripheral vascular disease/PAD -Continue home medications, aspirin and Plavix -Follow-up with vascular surgery  Hyperlipidemia -Continue statin  Depression -Continue sertraline  Diabetes mellitus, type II -Hemoglobin A1c 7.1 -Patient has had some hypoglycemia, would continue home basal Klar at reduced dose along with sliding insulin scale  Hypokalemia -Replaced -Repeat BMP in 1 week  Hypophosphatemia -resolved  Hypomagnesemia -replaced, repeat in one week  Dementia -Continue to follow-up with neurology as an outpatient  DVT Prophylaxis  lovenox  Code Status: Full  Family Communication: None at bedside.  Disposition Plan: Admitted. Pending discharge- daughter has appealed discharge to SNF. Wants patient to be discharged to [facility]. Case management made aware but not a candidate.  Consultants Nephrology, Dr. Hollie Salk, via phone  Procedures  None  Antibiotics   Anti-infectives (From admission, onward)   None      Subjective:   Windle Guard seen and  examined today.  Has no complaints today. Wonders what he is still doing in the hospital and  what we are doing for him. Denies current chest pain, shortness of breath, abdominal pain, N/V/D/C.   Objective:   Vitals:   04/09/18 1346 04/09/18 1400 04/09/18 2058 04/10/18 0549  BP: (!) 122/53 (!) 141/76 (!) 154/80 (!) 161/97  Pulse: 78  89 81  Resp: 19  19 15   Temp: 98.9 F (37.2 C)  98.1 F (36.7 C) 98.4 F (36.9 C)  TempSrc: Axillary  Oral Oral  SpO2:   97% 98%  Weight:      Height:        Intake/Output Summary (Last 24 hours) at 04/10/2018 1142 Last data filed at 04/10/2018 0900 Gross per 24 hour  Intake 926.95 ml  Output 800 ml  Net 126.95 ml   Filed Weights   04/03/18 2004 04/05/18 1300  Weight: 79.4 kg 79.9 kg    Exam  General: Well developed, well nourished, NAD, appears stated age  30: NCAT,  mucous membranes moist.   Neck: Supple  Cardiovascular: S1 S2 auscultated, RRR  Respiratory: Clear to auscultation bilaterally with equal chest rise  Abdomen: Soft, nontender, nondistended, + bowel sounds  Extremities: warm dry without cyanosis clubbing or edema  Neuro: AAOx3, nonfocal  Psych: Pleasant, appropriate mood and affect   Data Reviewed: I have personally reviewed following labs and imaging studies  CBC: Recent Labs  Lab 04/05/18 0630 04/06/18 0353 04/07/18 0416 04/08/18 0354 04/09/18 0443  WBC 4.8 5.4 5.0 5.1 5.3  NEUTROABS 2.6 2.6 2.4 2.3 2.1  HGB 9.2* 9.4* 9.4* 9.3* 9.0*  HCT 28.8* 29.9* 30.0* 28.1* 28.5*  MCV 90.6 92.0 91.7 92.1 92.2  PLT 201 195 207 217 315   Basic Metabolic Panel: Recent Labs  Lab 04/05/18 0630 04/06/18 0353 04/07/18 0416 04/08/18 0354 04/09/18 0443  NA 143 143 142 141 138  K 3.0* 3.5 3.8 3.6 3.4*  CL 112* 110 109 110 105  CO2 23 22 23 24 24   GLUCOSE 57* 95 108* 164* 96  BUN 14 12 15 21 18   CREATININE 1.47* 1.38* 1.49* 1.65* 1.52*  CALCIUM 7.9* 8.1* 8.3* 8.3* 8.4*  MG 1.6* 1.7 1.8 1.7 1.5*  PHOS 2.5 4.3 2.8  3.0 2.9   GFR: Estimated Creatinine Clearance: 44.6 mL/min (A) (by C-G formula based on SCr of 1.52 mg/dL (H)). Liver Function Tests: Recent Labs  Lab 04/05/18 0630 04/06/18 0353 04/07/18 0416 04/08/18 0354 04/09/18 0443  AST 14* 14* 16 19 16   ALT 11 10 13 17 15   ALKPHOS 46 43 46 51 45  BILITOT 0.8 0.7 0.8 0.8 0.8  PROT 6.5 6.4* 6.5 6.3* 6.2*  ALBUMIN 3.0* 3.0* 3.1* 2.9* 2.9*   No results for input(s): LIPASE, AMYLASE in the last 168 hours. No results for input(s): AMMONIA in the last 168 hours. Coagulation Profile: No results for input(s): INR, PROTIME in the last 168 hours. Cardiac Enzymes: Recent Labs  Lab 04/04/18 0036  TROPONINI <0.03   BNP (last 3 results) No results for input(s): PROBNP in the last 8760 hours. HbA1C: No results for input(s): HGBA1C in the last 72 hours. CBG: Recent Labs  Lab 04/09/18 1423 04/09/18 1631 04/09/18 2242 04/10/18 0638 04/10/18 1115  GLUCAP 169* 191* 229* 170* 117*   Lipid Profile: No results for input(s): CHOL, HDL, LDLCALC, TRIG, CHOLHDL, LDLDIRECT in the last 72 hours. Thyroid Function Tests: No results for input(s): TSH, T4TOTAL, FREET4, T3FREE, THYROIDAB in the last 72 hours. Anemia Panel: Recent Labs    04/08/18 0354  VITAMINB12 168*  FOLATE 9.2  FERRITIN 34  TIBC 265  IRON 28*  RETICCTPCT 2.1   Urine analysis:    Component Value Date/Time   COLORURINE YELLOW 02/24/2018 1455   APPEARANCEUR CLEAR 02/24/2018 1455   LABSPEC 1.015 02/24/2018 1455   PHURINE 6.0 02/24/2018 1455   GLUCOSEU >=500 (A) 02/24/2018 1455   HGBUR TRACE (A) 02/24/2018 1455   BILIRUBINUR NEGATIVE 02/24/2018 1455   KETONESUR NEGATIVE 02/24/2018 1455   PROTEINUR NEGATIVE 02/24/2018 1455   UROBILINOGEN 0.2 10/09/2014 2112   NITRITE NEGATIVE 02/24/2018 1455   LEUKOCYTESUR NEGATIVE 02/24/2018 1455   Sepsis Labs: @LABRCNTIP (procalcitonin:4,lacticidven:4)  )No results found for this or any previous visit (from the past 240 hour(s)).     Radiology Studies: No results found.   Scheduled Meds: . aspirin EC  81 mg Oral Daily  . carbamide peroxide  5 drop Both EARS BID  . clopidogrel  75 mg Oral Daily  . enoxaparin (LOVENOX) injection  40 mg Subcutaneous Daily  . fludrocortisone  0.1 mg Oral Daily  . insulin aspart  0-9 Units Subcutaneous TID WC  . insulin glargine  14 Units Subcutaneous QHS  . rosuvastatin  5 mg Oral Daily  . sertraline  25 mg Oral Daily  . vitamin B-12  1,000 mcg Oral Daily   Continuous Infusions: . sodium chloride 10 mL/hr at 04/09/18 1504     LOS: 6 days   Time Spent in minutes   30 minutes  Helmuth Recupero D.O. on 04/10/2018 at 11:42 AM  Between 7am to 7pm - Please see pager noted on [email or web address]  After 7pm go to [email or web address]  And look for the night coverage person covering for me after hours  [facility] Office  [phone]

## 2018-04-10 NOTE — Plan of Care (Signed)
  Problem: Nutrition: Goal: Adequate nutrition will be maintained Outcome: Progressing   Problem: Safety: Goal: Ability to remain free from injury will improve Outcome: Progressing   

## 2018-04-11 LAB — GLUCOSE, CAPILLARY
Glucose-Capillary: 109 mg/dL — ABNORMAL HIGH (ref 70–99)
Glucose-Capillary: 136 mg/dL — ABNORMAL HIGH (ref 70–99)
Glucose-Capillary: 111 mg/dL — ABNORMAL HIGH (ref 70–99)
Glucose-Capillary: 149 mg/dL — ABNORMAL HIGH (ref 70–99)

## 2018-04-11 NOTE — Plan of Care (Signed)

## 2018-04-11 NOTE — Progress Notes (Signed)
OT Cancellation Note  Patient Details Name: Harry [name] MRN: [id number] DOB: [birth year]   Cancelled Treatment:    Reason Eval/Treat Not Completed: Other (comment) Met with pt sitting up in chair, declining therapy stating "I already did that today" (had PT this AM) despite education of OT role and encouragement. Will continue to follow pt while acute as available and appropriate.   Zenovia Jarred, MSOT, OTR/L Behavioral Health OT/ Acute Relief OT [facility] Office: [place] 04/11/2018, 2:45 PM

## 2018-04-11 NOTE — Progress Notes (Signed)
Physical Therapy Treatment Patient Details Name: Harry [name] MRN: [id number] DOB: 1945/06/19 Today's Date: 04/11/2018    History of Present Illness 73 y.o. male with medical history significant of CKD 3, hypertension, hyperlipidemia, PVD status post atherectomy, type 2 diabetes, CVA presenting to the hospital via EMS for evaluation of fall and rib pain.  Xray negative for rib fxs. Pt admitted for orthostatic hypotension.     PT Comments    Patient seen for mobility progression. Pt is more alert than during previous sessions. Pt tolerated gait training distance of 50 ft with min/mod A and chair follow for safety. Pt c/o "wooziness" only in standing this session and reports that once he is seated it subsides. Continue to progress as tolerated.      Follow Up Recommendations  SNF;Supervision/Assistance - 24 hour     Equipment Recommendations  None recommended by PT    Recommendations for Other Services       Precautions / Restrictions Precautions Precautions: Fall Precaution Comments: orthostatic    Mobility  Bed Mobility Overal bed mobility: Modified Independent             General bed mobility comments: increased time and effort  Transfers Overall transfer level: Needs assistance Equipment used: Rolling walker (2 wheeled) Transfers: Sit to/from Stand Sit to Stand: Min assist         General transfer comment: assist to power up into standing; cues for safe hand placement  Ambulation/Gait Ambulation/Gait assistance: Mod assist;+2 safety/equipment;Min assist(chair follow) Gait Distance (Feet): 50 Feet Assistive device: Rolling walker (2 wheeled) Gait Pattern/deviations: Step-to pattern;Decreased step length - right;Decreased dorsiflexion - right;Trunk flexed;Step-through pattern Gait velocity: decreased   General Gait Details: initially pt required min A and with step through pattern, still with decreased R step length compared to L; when pt becomes fatigued R  step length decreases and pt requires increased assistance to maintain balance and for safe use of AD; assistance required to guide RW and max cues needed for safe use of AD throughout   Stairs             Wheelchair Mobility    Modified Rankin (Stroke Patients Only)       Balance Overall balance assessment: Needs assistance Sitting-balance support: Feet supported;Single extremity supported Sitting balance-Leahy Scale: Fair     Standing balance support: Bilateral upper extremity supported;During functional activity Standing balance-Leahy Scale: Poor                              Cognition Arousal/Alertness: Awake/alert Behavior During Therapy: Flat affect Overall Cognitive Status: Within Functional Limits for tasks assessed                                        Exercises      General Comments General comments (skin integrity, edema, etc.): pt c/o wooziness only in standing today      Pertinent Vitals/Pain Pain Assessment: Faces Faces Pain Scale: No hurt    Home Living                      Prior Function            PT Goals (current goals can now be found in the care plan section) Progress towards PT goals: Progressing toward goals    Frequency    Min 3X/week  PT Plan Current plan remains appropriate    Co-evaluation              AM-PAC PT "6 Clicks" Mobility   Outcome Measure  Help needed turning from your back to your side while in a flat bed without using bedrails?: A Little Help needed moving from lying on your back to sitting on the side of a flat bed without using bedrails?: A Little Help needed moving to and from a bed to a chair (including a wheelchair)?: A Little Help needed standing up from a chair using your arms (e.g., wheelchair or bedside chair)?: A Lot Help needed to walk in hospital room?: A Lot Help needed climbing 3-5 steps with a railing? : Total 6 Click Score: 14    End of  Session Equipment Utilized During Treatment: Gait belt Activity Tolerance: Patient tolerated treatment well Patient left: with call bell/phone within reach;in chair Nurse Communication: Mobility status;Other (comment)(NT informed that pt wants to bathe) PT Visit Diagnosis: Other abnormalities of gait and mobility (R26.89);Pain;Muscle weakness (generalized) (M62.81);Difficulty in walking, not elsewhere classified (R26.2)     Time: 4665-9935 PT Time Calculation (min) (ACUTE ONLY): 14 min  Charges:  $Gait Training: 8-22 mins                     Earney Navy, PTA Acute Rehabilitation Services Pager: [phone] Office: [phone]     Darliss Cheney 04/11/2018, 9:22 AM

## 2018-04-11 NOTE — Progress Notes (Signed)
PROGRESS NOTE    Harry [name]  NOM:[id number] DOB: 1945-08-18 DOA: 04/03/2018 PCP: [name], MD   Brief Narrative:  HPI on 04/03/2018 by Dr. Iverson Alamin Brownis a 73 y.o.malewith medical history significant ofCKD 3, hypertension, hyperlipidemia, PVD status post atherectomy, type 2 diabetes, CVA presenting to the hospital via EMS for evaluation of fallandrib pain.Per son at bedside, patient has a history of recurrent falls secondary to his blood pressure dropping. States patient was walking this morning lost his balance and fell. Again this evening he fell while walking and has been complaining of left-sided rib pain since then. Patient states he has been dizzy for the past 3 weeks. Denies having any chest pain or shortness of breath prior to the falls.Family states patient did not lose consciousness. They did not notice any seizure-like activity today.  Interim history Admitted for recurrent falls and orthostatic hypotension.  Given IV fluid hydration however continues to be orthostatic.  Patient currently with TED hose as well as abdominal binder and fludrocortisone.  Patient medically stable for discharge as no other intervention can be offered.  Daughter is appealing discharge. Assessment & Plan   Recurrent falls/orthostatic hypotension -Unknown etiology, possibly dysautonomia given his history of diabetes -CT head as well as CT C-spine unremarkable for acute abnormality -Chest x-ray reviewed, negative for infection -EKG without acute changes, troponin was also unremarkable -Patient does have a significant drop in systolic blood pressure from sitting to standing. -Despite IV fluid hydration, patient remains orthostatic -Continue Ted hose, abdominal binder, fludrocortisone -Patient was not able to tolerate midodrine in the past given urinary symptoms -Discussed with nephrology- feels this may be due to dysautonomia, may be worse when he eats and  midodrine could be given at that time.  -Discussed this with daughter, it has not been associated with eating. Discussed referral to neurology for dysautonomia.  -PT and OT recommending SNF.  Patient requiring moderate assistance for sitting to standing transfers as well as pivoting due to generalized weakness and impaired balance.  Patient would benefit from further skilled physical therapy services to maximize independence and safety with mobility.  Left-sided rib pain secondary to fall -Continue Tylenol as needed  Essential hypertension -Patient is on fludrocortisone  Normocytic anemia/anemia of chronic kidney disease -Hemoglobin currently 9, appears to be close to baseline -Anemia panel showed iron 28, TIBC 265, ferritin 34, vitamin B12 168 (placed on supplementation)  Acute kidney injury on chronic kidney disease, stage III -Resolved -Baseline creatinine 1.8-2 -Creatinine currently 1.52  Peripheral vascular disease/PAD -Continue home medications, aspirin and Plavix -Follow-up with vascular surgery  Hyperlipidemia -Continue statin  Depression -Continue sertraline  Diabetes mellitus, type II -Hemoglobin A1c 7.1 -Patient has had some hypoglycemia, would continue home basal insulin at reduced dose along with sliding insulin scale -CBGs have been controlled  Hypokalemia -Replaced  Hypophosphatemia -resolved  Hypomagnesemia -replaced  Dementia -Continue to follow-up with neurology as an outpatient  DVT Prophylaxis  lovenox  Code Status: Full  Family Communication: None at bedside.  Disposition Plan: Admitted. Pending discharge- daughter has appealed discharge to SNF. Wants patient to be discharged to [facility]. Case management made aware but not a candidate.  Consultants Nephrology, Dr. Hollie Salk, via phone  Procedures  None  Antibiotics   Anti-infectives (From admission, onward)   None      Subjective:   Harry [name] seen and examined today.   Patient with no complaints today.  States he like to go home.  Denies current chest pain, shortness breath,  abdominal pain, nausea or vomiting, diarrhea constipation, dizziness or headache.  Objective:   Vitals:   04/11/18 0446 04/11/18 0451 04/11/18 0904 04/11/18 1253  BP: (!) 186/99 (!) 165/89 (!) 163/82 (!) 148/76  Pulse: 83 82 87 82  Resp: 18 17 16 16   Temp: 98.6 F (37 C)  98.2 F (36.8 C) 97.8 F (36.6 C)  TempSrc: Oral  Oral Oral  SpO2: 98% 99% 99% 98%  Weight:      Height:        Intake/Output Summary (Last 24 hours) at 04/11/2018 1314 Last data filed at 04/11/2018 0900 Gross per 24 hour  Intake 480 ml  Output 2050 ml  Net -1570 ml   Filed Weights   04/03/18 2004 04/05/18 1300  Weight: 79.4 kg 79.9 kg   Exam  General: Well developed, well nourished, NAD, appears stated age  77: NCAT,  mucous membranes moist.   Neuro: AAOx3, nonfocal  Psych: Pleasant, appropriate mood and affect  Data Reviewed: I have personally reviewed following labs and imaging studies  CBC: Recent Labs  Lab 04/05/18 0630 04/06/18 0353 04/07/18 0416 04/08/18 0354 04/09/18 0443  WBC 4.8 5.4 5.0 5.1 5.3  NEUTROABS 2.6 2.6 2.4 2.3 2.1  HGB 9.2* 9.4* 9.4* 9.3* 9.0*  HCT 28.8* 29.9* 30.0* 28.1* 28.5*  MCV 90.6 92.0 91.7 92.1 92.2  PLT 201 195 207 217 240   Basic Metabolic Panel: Recent Labs  Lab 04/05/18 0630 04/06/18 0353 04/07/18 0416 04/08/18 0354 04/09/18 0443 04/10/18 1205  NA 143 143 142 141 138 140  K 3.0* 3.5 3.8 3.6 3.4* 3.5  CL 112* 110 109 110 105 104  CO2 23 22 23 24 24 25   GLUCOSE 57* 95 108* 164* 96 119*  BUN 14 12 15 21 18 16   CREATININE 1.47* 1.38* 1.49* 1.65* 1.52* 1.51*  CALCIUM 7.9* 8.1* 8.3* 8.3* 8.4* 8.6*  MG 1.6* 1.7 1.8 1.7 1.5*  --   PHOS 2.5 4.3 2.8 3.0 2.9  --    GFR: Estimated Creatinine Clearance: 44.9 mL/min (A) (by C-G formula based on SCr of 1.51 mg/dL (H)). Liver Function Tests: Recent Labs  Lab 04/05/18 0630 04/06/18 0353  04/07/18 0416 04/08/18 0354 04/09/18 0443  AST 14* 14* 16 19 16   ALT 11 10 13 17 15   ALKPHOS 46 43 46 51 45  BILITOT 0.8 0.7 0.8 0.8 0.8  PROT 6.5 6.4* 6.5 6.3* 6.2*  ALBUMIN 3.0* 3.0* 3.1* 2.9* 2.9*   No results for input(s): LIPASE, AMYLASE in the last 168 hours. No results for input(s): AMMONIA in the last 168 hours. Coagulation Profile: No results for input(s): INR, PROTIME in the last 168 hours. Cardiac Enzymes: No results for input(s): CKTOTAL, CKMB, CKMBINDEX, TROPONINI in the last 168 hours. BNP (last 3 results) No results for input(s): PROBNP in the last 8760 hours. HbA1C: No results for input(s): HGBA1C in the last 72 hours. CBG: Recent Labs  Lab 04/10/18 1115 04/10/18 1606 04/10/18 2146 04/11/18 0638 04/11/18 1128  GLUCAP 117* 157* 140* 109* 136*   Lipid Profile: No results for input(s): CHOL, HDL, LDLCALC, TRIG, CHOLHDL, LDLDIRECT in the last 72 hours. Thyroid Function Tests: No results for input(s): TSH, T4TOTAL, FREET4, T3FREE, THYROIDAB in the last 72 hours. Anemia Panel: No results for input(s): VITAMINB12, FOLATE, FERRITIN, TIBC, IRON, RETICCTPCT in the last 72 hours. Urine analysis:    Component Value Date/Time   COLORURINE YELLOW 02/24/2018 [place] 02/24/2018 1455   LABSPEC 1.015 02/24/2018 1455  PHURINE 6.0 02/24/2018 1455   GLUCOSEU >=500 (A) 02/24/2018 1455   HGBUR TRACE (A) 02/24/2018 1455   BILIRUBINUR NEGATIVE 02/24/2018 1455   KETONESUR NEGATIVE 02/24/2018 1455   PROTEINUR NEGATIVE 02/24/2018 1455   UROBILINOGEN 0.2 10/09/2014 2112   NITRITE NEGATIVE 02/24/2018 1455   LEUKOCYTESUR NEGATIVE 02/24/2018 1455   Sepsis Labs: @LABRCNTIP (procalcitonin:4,lacticidven:4)  )No results found for this or any previous visit (from the past 240 hour(s)).    Radiology Studies: No results found.   Scheduled Meds: . aspirin EC  81 mg Oral Daily  . carbamide peroxide  5 drop Both EARS BID  . clopidogrel  75 mg Oral Daily  .  enoxaparin (LOVENOX) injection  40 mg Subcutaneous Daily  . fludrocortisone  0.1 mg Oral Daily  . insulin aspart  0-9 Units Subcutaneous TID WC  . insulin glargine  14 Units Subcutaneous QHS  . rosuvastatin  5 mg Oral Daily  . sertraline  25 mg Oral Daily  . vitamin B-12  1,000 mcg Oral Daily   Continuous Infusions: . sodium chloride 10 mL/hr at 04/09/18 1504     LOS: 7 days   Time Spent in minutes   30 minutes  Larua Collier D.O. on 04/11/2018 at 1:13 PM  Between 7am to 7pm - Please see pager noted on [email or web address]  After 7pm go to [email or web address]  And look for the night coverage person covering for me after hours  [facility] Office  [phone]

## 2018-04-11 NOTE — Care Management (Signed)
Case manager received call from [place] notifying that [facility]  MD has upheld patient's appeal. CM will notify Dr. Nanda Quinton has left a message for patient's daughter [name].

## 2018-04-11 NOTE — Plan of Care (Signed)
  Problem: Skin Integrity: Goal: Risk for impaired skin integrity will decrease Outcome: Progressing   Problem: Safety: Goal: Ability to remain free from injury will improve Outcome: Progressing   

## 2018-04-12 LAB — GLUCOSE, CAPILLARY
Glucose-Capillary: 121 mg/dL — ABNORMAL HIGH (ref 70–99)
Glucose-Capillary: 155 mg/dL — ABNORMAL HIGH (ref 70–99)
Glucose-Capillary: 115 mg/dL — ABNORMAL HIGH (ref 70–99)
Glucose-Capillary: 217 mg/dL — ABNORMAL HIGH (ref 70–99)

## 2018-04-12 MED ORDER — PYRIDOSTIGMINE BROMIDE 60 MG PO TABS
30.0000 mg | ORAL_TABLET | Freq: Two times a day (BID) | ORAL | Status: DC
  Administered 2018-04-12 – 2018-04-14 (×5): 30 mg via ORAL
  Filled 2018-04-12 (×5): qty 0.5

## 2018-04-12 NOTE — Plan of Care (Signed)

## 2018-04-12 NOTE — Progress Notes (Signed)
PROGRESS NOTE    Harry [name]  BOF:[id number] DOB: 09/20/1945 DOA: 04/03/2018 PCP: [name], MD   Brief Narrative:  HPI on 04/03/2018 by Dr. Iverson Alamin Brownis a 73 y.o.malewith medical history significant ofCKD 3, hypertension, hyperlipidemia, PVD status post atherectomy, type 2 diabetes, CVA presenting to the hospital via EMS for evaluation of fallandrib pain.Per son at bedside, patient has a history of recurrent falls secondary to his blood pressure dropping. States patient was walking this morning lost his balance and fell. Again this evening he fell while walking and has been complaining of left-sided rib pain since then. Patient states he has been dizzy for the past 3 weeks. Denies having any chest pain or shortness of breath prior to the falls.Family states patient did not lose consciousness. They did not notice any seizure-like activity today.  Interim history Admitted for recurrent falls and orthostatic hypotension.  Given IV fluid hydration however continues to be orthostatic.  Patient currently with TED hose as well as abdominal binder and fludrocortisone.  Patient medically stable for discharge as no other intervention can be offered.  Daughter is appealing discharge. Assessment & Plan   Recurrent falls/orthostatic hypotension -Unknown etiology, possibly dysautonomia given his history of diabetes -CT head as well as CT C-spine unremarkable for acute abnormality -Chest x-ray reviewed, negative for infection -EKG without acute changes, troponin was also unremarkable -Patient does have a significant drop in systolic blood pressure from sitting to standing. -Despite IV fluid hydration, patient remains orthostatic -Patient was not able to tolerate midodrine in the past given urinary symptoms -Discussed with nephrology- feels this may be due to dysautonomia, may be worse when he eats and midodrine could be given at that time.  -Discussed this  with daughter, it has not been associated with eating. Discussed referral to neurology for dysautonomia.  -PT and OT recommending SNF.  Patient requiring moderate assistance for sitting to standing transfers as well as pivoting due to generalized weakness and impaired balance.  Patient would benefit from further skilled physical therapy services to maximize independence and safety with mobility. -Will place on thigh high hose -Continue abdominal binder -Discussed with cardiology, recommended discontinued florinef and trying mestinon -will start on low dose mestinon, 30mg  BID and monitor   Left-sided rib pain secondary to fall -Continue Tylenol as needed  Essential hypertension -Patient is on fludrocortisone  Normocytic anemia/anemia of chronic kidney disease -Hemoglobin currently 9, appears to be close to baseline -Anemia panel showed iron 28, TIBC 265, ferritin 34, vitamin B12 168 (placed on supplementation)  Acute kidney injury on chronic kidney disease, stage III -Resolved -Baseline creatinine 1.8-2 -Creatinine currently 1.52  Peripheral vascular disease/PAD -Continue home medications, aspirin and Plavix -Follow-up with vascular surgery  Hyperlipidemia -Continue statin  Depression -Continue sertraline  Diabetes mellitus, type II -Hemoglobin A1c 7.1 -Patient has had some hypoglycemia, would continue home basal insulin at reduced dose along with sliding insulin scale -CBGs have been controlled  Hypokalemia -Replaced  Hypophosphatemia -resolved  Hypomagnesemia -replaced  Dementia -Continue to follow-up with neurology as an outpatient  DVT Prophylaxis  lovenox  Code Status: Full  Family Communication: None at bedside.  Disposition Plan: Admitted. Pending discharge- daughter has appealed discharge to SNF. Wants patient to be discharged to [facility]. Case management made aware but not a candidate. Have started patient on different regimen, will monitor for a  couple of days. Possibly discharge to SNF next week.  Consultants Nephrology, Dr. Hollie Salk, via phone Cardiology, Dr. Radford Pax, via phone  Procedures  None  Antibiotics   Anti-infectives (From admission, onward)   None      Subjective:   Harry [name] seen and examined today.  Patient with no complaints today.  Denies current chest pain, shortness breath, abdominal pain, nausea vomiting, diarrhea constipation, dizziness or headache.  Objective:   Vitals:   04/11/18 2106 04/12/18 0212 04/12/18 0221 04/12/18 0556  BP: 120/69 (!) 185/101 (!) 190/90 120/62  Pulse: 85 86    Resp: 20 20    Temp: (!) 97.5 F (36.4 C)     TempSrc: Oral     SpO2: 100% 99%    Weight:      Height:        Intake/Output Summary (Last 24 hours) at 04/12/2018 1323 Last data filed at 04/12/2018 1157 Gross per 24 hour  Intake 480 ml  Output 300 ml  Net 180 ml   Filed Weights   04/03/18 2004 04/05/18 1300  Weight: 79.4 kg 79.9 kg   Exam  General: Well developed, well nourished, NAD, appears stated age  42: NCAT,mucous membranes moist.   Neck: Supple  Cardiovascular: S1 S2 auscultated, RRR  Respiratory: Clear to auscultation bilaterally with equal chest rise  Abdomen: Soft, nontender, nondistended, + bowel sounds  Extremities: warm dry without cyanosis clubbing or edema  Neuro: AAOx3, nonfocal  Psych: Normal affect and demeanor with intact judgement and insight  Data Reviewed: I have personally reviewed following labs and imaging studies  CBC: Recent Labs  Lab 04/06/18 0353 04/07/18 0416 04/08/18 0354 04/09/18 0443  WBC 5.4 5.0 5.1 5.3  NEUTROABS 2.6 2.4 2.3 2.1  HGB 9.4* 9.4* 9.3* 9.0*  HCT 29.9* 30.0* 28.1* 28.5*  MCV 92.0 91.7 92.1 92.2  PLT 195 207 217 335   Basic Metabolic Panel: Recent Labs  Lab 04/06/18 0353 04/07/18 0416 04/08/18 0354 04/09/18 0443 04/10/18 1205  NA 143 142 141 138 140  K 3.5 3.8 3.6 3.4* 3.5  CL 110 109 110 105 104  CO2 22 23 24 24 25     GLUCOSE 95 108* 164* 96 119*  BUN 12 15 21 18 16   CREATININE 1.38* 1.49* 1.65* 1.52* 1.51*  CALCIUM 8.1* 8.3* 8.3* 8.4* 8.6*  MG 1.7 1.8 1.7 1.5*  --   PHOS 4.3 2.8 3.0 2.9  --    GFR: Estimated Creatinine Clearance: 44.9 mL/min (A) (by C-G formula based on SCr of 1.51 mg/dL (H)). Liver Function Tests: Recent Labs  Lab 04/06/18 0353 04/07/18 0416 04/08/18 0354 04/09/18 0443  AST 14* 16 19 16   ALT 10 13 17 15   ALKPHOS 43 46 51 45  BILITOT 0.7 0.8 0.8 0.8  PROT 6.4* 6.5 6.3* 6.2*  ALBUMIN 3.0* 3.1* 2.9* 2.9*   No results for input(s): LIPASE, AMYLASE in the last 168 hours. No results for input(s): AMMONIA in the last 168 hours. Coagulation Profile: No results for input(s): INR, PROTIME in the last 168 hours. Cardiac Enzymes: No results for input(s): CKTOTAL, CKMB, CKMBINDEX, TROPONINI in the last 168 hours. BNP (last 3 results) No results for input(s): PROBNP in the last 8760 hours. HbA1C: No results for input(s): HGBA1C in the last 72 hours. CBG: Recent Labs  Lab 04/11/18 1128 04/11/18 1656 04/11/18 2158 04/12/18 0635 04/12/18 1124  GLUCAP 136* 111* 149* 121* 155*   Lipid Profile: No results for input(s): CHOL, HDL, LDLCALC, TRIG, CHOLHDL, LDLDIRECT in the last 72 hours. Thyroid Function Tests: No results for input(s): TSH, T4TOTAL, FREET4, T3FREE, THYROIDAB in the last 72 hours. Anemia Panel: No  results for input(s): VITAMINB12, FOLATE, FERRITIN, TIBC, IRON, RETICCTPCT in the last 72 hours. Urine analysis:    Component Value Date/Time   COLORURINE YELLOW 02/24/2018 1455   APPEARANCEUR CLEAR 02/24/2018 1455   LABSPEC 1.015 02/24/2018 1455   PHURINE 6.0 02/24/2018 1455   GLUCOSEU >=500 (A) 02/24/2018 1455   HGBUR TRACE (A) 02/24/2018 1455   BILIRUBINUR NEGATIVE 02/24/2018 1455   KETONESUR NEGATIVE 02/24/2018 1455   PROTEINUR NEGATIVE 02/24/2018 1455   UROBILINOGEN 0.2 10/09/2014 2112   NITRITE NEGATIVE 02/24/2018 1455   LEUKOCYTESUR NEGATIVE 02/24/2018  1455   Sepsis Labs: @LABRCNTIP (procalcitonin:4,lacticidven:4)  )No results found for this or any previous visit (from the past 240 hour(s)).    Radiology Studies: No results found.   Scheduled Meds: . aspirin EC  81 mg Oral Daily  . carbamide peroxide  5 drop Both EARS BID  . clopidogrel  75 mg Oral Daily  . enoxaparin (LOVENOX) injection  40 mg Subcutaneous Daily  . insulin aspart  0-9 Units Subcutaneous TID WC  . insulin glargine  14 Units Subcutaneous QHS  . rosuvastatin  5 mg Oral Daily  . sertraline  25 mg Oral Daily  . vitamin B-12  1,000 mcg Oral Daily   Continuous Infusions: . sodium chloride 10 mL/hr at 04/09/18 1504     LOS: 8 days   Time Spent in minutes   30 minutes  Juddson Cobern D.O. on 04/12/2018 at 1:23 PM  Between 7am to 7pm - Please see pager noted on [email or web address]  After 7pm go to [email or web address]  And look for the night coverage person covering for me after hours  [facility] Office  [phone]

## 2018-04-13 LAB — BASIC METABOLIC PANEL
Anion gap: 8 (ref 5–15)
BUN: 29 mg/dL — ABNORMAL HIGH (ref 8–23)
CO2: 24 mmol/L (ref 22–32)
Calcium: 8.7 mg/dL — ABNORMAL LOW (ref 8.9–10.3)
Chloride: 107 mmol/L (ref 98–111)
Creatinine, Ser: 1.44 mg/dL — ABNORMAL HIGH (ref 0.61–1.24)
GFR calc Af Amer: 56 mL/min — ABNORMAL LOW (ref >60)
GFR calc non Af Amer: 49 mL/min — ABNORMAL LOW (ref >60)
Glucose, Bld: 106 mg/dL — ABNORMAL HIGH (ref 70–99)
Potassium: 3.7 mmol/L (ref 3.5–5.1)
Sodium: 139 mmol/L (ref 135–145)

## 2018-04-13 LAB — GLUCOSE, CAPILLARY
Glucose-Capillary: 96 mg/dL (ref 70–99)
Glucose-Capillary: 191 mg/dL — ABNORMAL HIGH (ref 70–99)
Glucose-Capillary: 172 mg/dL — ABNORMAL HIGH (ref 70–99)
Glucose-Capillary: 124 mg/dL — ABNORMAL HIGH (ref 70–99)

## 2018-04-13 LAB — MAGNESIUM: Magnesium: 1.7 mg/dL (ref 1.7–2.4)

## 2018-04-13 NOTE — Progress Notes (Signed)
PROGRESS NOTE    Harry [name]  WCB:[id number] DOB: 1946-01-22 DOA: 04/03/2018 PCP: [name], MD   Brief Narrative:  HPI on 04/03/2018 by Dr. Iverson Alamin Brownis a 73 y.o.malewith medical history significant ofCKD 3, hypertension, hyperlipidemia, PVD status post atherectomy, type 2 diabetes, CVA presenting to the hospital via EMS for evaluation of fallandrib pain.Per son at bedside, patient has a history of recurrent falls secondary to his blood pressure dropping. States patient was walking this morning lost his balance and fell. Again this evening he fell while walking and has been complaining of left-sided rib pain since then. Patient states he has been dizzy for the past 3 weeks. Denies having any chest pain or shortness of breath prior to the falls.Family states patient did not lose consciousness. They did not notice any seizure-like activity today.  Interim history Admitted for recurrent falls and orthostatic hypotension.  Given IV fluid hydration however continues to be orthostatic.  Patient currently with TED hose as well as abdominal binder and fludrocortisone.  Patient medically stable for discharge as no other intervention can be offered.  Daughter is appealing discharge. Assessment & Plan   Recurrent falls/orthostatic hypotension -Unknown etiology, possibly dysautonomia given his history of diabetes -CT head as well as CT C-spine unremarkable for acute abnormality -Chest x-ray reviewed, negative for infection -EKG without acute changes, troponin was also unremarkable -Patient does have a significant drop in systolic blood pressure from sitting to standing. -Despite IV fluid hydration, patient remains orthostatic -Patient was not able to tolerate midodrine in the past given urinary symptoms -Discussed with nephrology- feels this may be due to dysautonomia, may be worse when he eats and midodrine could be given at that time.  -Discussed this  with daughter, it has not been associated with eating. Discussed referral to neurology for dysautonomia.  -PT and OT recommending SNF.  Patient requiring moderate assistance for sitting to standing transfers as well as pivoting due to generalized weakness and impaired balance.  Patient would benefit from further skilled physical therapy services to maximize independence and safety with mobility. -Continue thigh high hose, abdominal binder -Discussed with cardiology 04/12/2018, recommended discontinued florinef and trying mestinon -started on low dose mestinon 30mg  BID on 04/12/2018  Left-sided rib pain secondary to fall -Continue Tylenol as needed  Essential hypertension -Patient is on fludrocortisone  Normocytic anemia/anemia of chronic kidney disease -Hemoglobin currently 9, appears to be close to baseline -Anemia panel showed iron 28, TIBC 265, ferritin 34, vitamin B12 168 (placed on supplementation)  Acute kidney injury on chronic kidney disease, stage III -Resolved -Baseline creatinine 1.8-2 -Creatinine currently 1.52  Peripheral vascular disease/PAD -Continue home medications, aspirin and Plavix -Follow-up with vascular surgery  Hyperlipidemia -Continue statin  Depression -Continue sertraline  Diabetes mellitus, type II -Hemoglobin A1c 7.1 -Patient has had some hypoglycemia, would continue home basal insulin at reduced dose along with sliding insulin scale -CBGs have been controlled  Hypokalemia -Replaced  Hypophosphatemia -resolved  Hypomagnesemia -replaced  Dementia -Continue to follow-up with neurology as an outpatient  DVT Prophylaxis  lovenox  Code Status: Full  Family Communication: None at bedside.  Disposition Plan: Admitted. Pending discharge- daughter has appealed discharge to SNF. Wants patient to be [facility] to [facility]. Case management made aware but not a candidate. Have started patient on different regimen, will monitor for a couple of  days. Possibly discharge to SNF next week.  Consultants Nephrology, Dr. Hollie Salk, via phone Cardiology, Dr. Radford Pax, via phone  Procedures  None  Antibiotics  Anti-infectives (From admission, onward)   None      Subjective:   Harry [name] seen and examined today.  No complaints today.  Does have some dizziness.  Denies current chest pain, shortness of breath, domino pain, nausea vomiting, diarrhea constipation, headache.  Objective:   Vitals:   04/12/18 1412 04/12/18 2027 04/13/18 0630 04/13/18 0700  BP: (!) 145/67 (!) 169/80 (!) 185/84 138/62  Pulse: 87 77 82   Resp: 18  18   Temp: 98.9 F (37.2 C) 98.1 F (36.7 C) 98 F (36.7 C)   TempSrc: Axillary Axillary    SpO2: 100% 99% 99%   Weight:      Height:        Intake/Output Summary (Last 24 hours) at 04/13/2018 1333 Last data filed at 04/13/2018 0900 Gross per 24 hour  Intake 600 ml  Output 950 ml  Net -350 ml   Filed Weights   04/03/18 2004 04/05/18 1300  Weight: 79.4 kg 79.9 kg   Exam  General: Well developed, well nourished, NAD, appears stated age  29: NCAT, mucous membranes moist.   Extremities: warm dry without cyanosis clubbing or edema  Neuro: AAOx3, nonfocal  Psych: Normal affect and demeanor   Data Reviewed: I have personally reviewed following labs and imaging studies  CBC: Recent Labs  Lab 04/07/18 0416 04/08/18 0354 04/09/18 0443  WBC 5.0 5.1 5.3  NEUTROABS 2.4 2.3 2.1  HGB 9.4* 9.3* 9.0*  HCT 30.0* 28.1* 28.5*  MCV 91.7 92.1 92.2  PLT 207 217 703   Basic Metabolic Panel: Recent Labs  Lab 04/07/18 0416 04/08/18 0354 04/09/18 0443 04/10/18 1205 04/13/18 0723  NA 142 141 138 140 139  K 3.8 3.6 3.4* 3.5 3.7  CL 109 110 105 104 107  CO2 23 24 24 25 24   GLUCOSE 108* 164* 96 119* 106*  BUN 15 21 18 16  29*  CREATININE 1.49* 1.65* 1.52* 1.51* 1.44*  CALCIUM 8.3* 8.3* 8.4* 8.6* 8.7*  MG 1.8 1.7 1.5*  --  1.7  PHOS 2.8 3.0 2.9  --   --    GFR: Estimated Creatinine  Clearance: 47.1 mL/min (A) (by C-G formula based on SCr of 1.44 mg/dL (H)). Liver Function Tests: Recent Labs  Lab 04/07/18 0416 04/08/18 0354 04/09/18 0443  AST 16 19 16   ALT 13 17 15   ALKPHOS 46 51 45  BILITOT 0.8 0.8 0.8  PROT 6.5 6.3* 6.2*  ALBUMIN 3.1* 2.9* 2.9*   No results for input(s): LIPASE, AMYLASE in the last 168 hours. No results for input(s): AMMONIA in the last 168 hours. Coagulation Profile: No results for input(s): INR, PROTIME in the last 168 hours. Cardiac Enzymes: No results for input(s): CKTOTAL, CKMB, CKMBINDEX, TROPONINI in the last 168 hours. BNP (last 3 results) No results for input(s): PROBNP in the last 8760 hours. HbA1C: No results for input(s): HGBA1C in the last 72 hours. CBG: Recent Labs  Lab 04/12/18 1124 04/12/18 1626 04/12/18 2110 04/13/18 0628 04/13/18 1126  GLUCAP 155* 115* 217* 96 191*   Lipid Profile: No results for input(s): CHOL, HDL, LDLCALC, TRIG, CHOLHDL, LDLDIRECT in the last 72 hours. Thyroid Function Tests: No results for input(s): TSH, T4TOTAL, FREET4, T3FREE, THYROIDAB in the last 72 hours. Anemia Panel: No results for input(s): VITAMINB12, FOLATE, FERRITIN, TIBC, IRON, RETICCTPCT in the last 72 hours. Urine analysis:    Component Value Date/Time   COLORURINE YELLOW 02/24/2018 [place] 02/24/2018 1455   LABSPEC 1.015 02/24/2018 1455  PHURINE 6.0 02/24/2018 1455   GLUCOSEU >=500 (A) 02/24/2018 1455   HGBUR TRACE (A) 02/24/2018 1455   BILIRUBINUR NEGATIVE 02/24/2018 1455   KETONESUR NEGATIVE 02/24/2018 1455   PROTEINUR NEGATIVE 02/24/2018 1455   UROBILINOGEN 0.2 10/09/2014 2112   NITRITE NEGATIVE 02/24/2018 1455   LEUKOCYTESUR NEGATIVE 02/24/2018 1455   Sepsis Labs: @LABRCNTIP (procalcitonin:4,lacticidven:4)  )No results found for this or any previous visit (from the past 240 hour(s)).    Radiology Studies: No results found.   Scheduled Meds: . aspirin EC  81 mg Oral Daily  . carbamide  peroxide  5 drop Both EARS BID  . clopidogrel  75 mg Oral Daily  . enoxaparin (LOVENOX) injection  40 mg Subcutaneous Daily  . insulin aspart  0-9 Units Subcutaneous TID WC  . insulin glargine  14 Units Subcutaneous QHS  . pyridostigmine  30 mg Oral BID  . rosuvastatin  5 mg Oral Daily  . sertraline  25 mg Oral Daily  . vitamin B-12  1,000 mcg Oral Daily   Continuous Infusions:    LOS: 9 days   Time Spent in minutes   30 minutes  Lynae Pederson D.O. on 04/13/2018 at 1:33 PM  Between 7am to 7pm - Please see pager noted on [email or web address]  After 7pm go to [email or web address]  And look for the night coverage person covering for me after hours  [facility] Office  [phone]

## 2018-04-13 NOTE — Plan of Care (Signed)
  Problem: Elimination: Goal: Will not experience complications related to bowel motility Outcome: Progressing Goal: Will not experience complications related to urinary retention Outcome: Progressing   Problem: Pain Managment: Goal: General experience of comfort will improve Outcome: Progressing   Problem: Skin Integrity: Goal: Risk for impaired skin integrity will decrease Outcome: Progressing   

## 2018-04-14 DIAGNOSIS — D649 Anemia, unspecified: Secondary | ICD-10-CM | POA: Diagnosis not present

## 2018-04-14 DIAGNOSIS — I10 Essential (primary) hypertension: Secondary | ICD-10-CM | POA: Diagnosis not present

## 2018-04-14 DIAGNOSIS — H5712 Ocular pain, left eye: Secondary | ICD-10-CM | POA: Diagnosis not present

## 2018-04-14 DIAGNOSIS — W3400XD Accidental discharge from unspecified firearms or gun, subsequent encounter: Secondary | ICD-10-CM | POA: Diagnosis not present

## 2018-04-14 DIAGNOSIS — I951 Orthostatic hypotension: Secondary | ICD-10-CM | POA: Diagnosis not present

## 2018-04-14 DIAGNOSIS — Z7401 Bed confinement status: Secondary | ICD-10-CM | POA: Diagnosis not present

## 2018-04-14 DIAGNOSIS — M2550 Pain in unspecified joint: Secondary | ICD-10-CM | POA: Diagnosis not present

## 2018-04-14 DIAGNOSIS — R5381 Other malaise: Secondary | ICD-10-CM | POA: Diagnosis not present

## 2018-04-14 DIAGNOSIS — E785 Hyperlipidemia, unspecified: Secondary | ICD-10-CM | POA: Diagnosis not present

## 2018-04-14 DIAGNOSIS — M4800 Spinal stenosis, site unspecified: Secondary | ICD-10-CM | POA: Diagnosis not present

## 2018-04-14 DIAGNOSIS — E278 Other specified disorders of adrenal gland: Secondary | ICD-10-CM | POA: Diagnosis not present

## 2018-04-14 DIAGNOSIS — R262 Difficulty in walking, not elsewhere classified: Secondary | ICD-10-CM | POA: Diagnosis not present

## 2018-04-14 DIAGNOSIS — W19XXXD Unspecified fall, subsequent encounter: Secondary | ICD-10-CM | POA: Diagnosis not present

## 2018-04-14 DIAGNOSIS — E876 Hypokalemia: Secondary | ICD-10-CM | POA: Diagnosis not present

## 2018-04-14 DIAGNOSIS — D631 Anemia in chronic kidney disease: Secondary | ICD-10-CM | POA: Diagnosis not present

## 2018-04-14 DIAGNOSIS — F339 Major depressive disorder, recurrent, unspecified: Secondary | ICD-10-CM | POA: Diagnosis not present

## 2018-04-14 DIAGNOSIS — F330 Major depressive disorder, recurrent, mild: Secondary | ICD-10-CM | POA: Diagnosis not present

## 2018-04-14 DIAGNOSIS — E1122 Type 2 diabetes mellitus with diabetic chronic kidney disease: Secondary | ICD-10-CM | POA: Diagnosis not present

## 2018-04-14 DIAGNOSIS — H269 Unspecified cataract: Secondary | ICD-10-CM | POA: Diagnosis not present

## 2018-04-14 DIAGNOSIS — M6281 Muscle weakness (generalized): Secondary | ICD-10-CM | POA: Diagnosis not present

## 2018-04-14 DIAGNOSIS — Z9862 Peripheral vascular angioplasty status: Secondary | ICD-10-CM | POA: Diagnosis not present

## 2018-04-14 DIAGNOSIS — I69959 Hemiplegia and hemiparesis following unspecified cerebrovascular disease affecting unspecified side: Secondary | ICD-10-CM | POA: Diagnosis not present

## 2018-04-14 DIAGNOSIS — R296 Repeated falls: Secondary | ICD-10-CM | POA: Diagnosis not present

## 2018-04-14 DIAGNOSIS — R4189 Other symptoms and signs involving cognitive functions and awareness: Secondary | ICD-10-CM | POA: Diagnosis not present

## 2018-04-14 DIAGNOSIS — F0390 Unspecified dementia without behavioral disturbance: Secondary | ICD-10-CM | POA: Diagnosis not present

## 2018-04-14 DIAGNOSIS — W19XXXA Unspecified fall, initial encounter: Secondary | ICD-10-CM | POA: Diagnosis not present

## 2018-04-14 DIAGNOSIS — R410 Disorientation, unspecified: Secondary | ICD-10-CM | POA: Diagnosis not present

## 2018-04-14 DIAGNOSIS — G3183 Dementia with Lewy bodies: Secondary | ICD-10-CM | POA: Diagnosis not present

## 2018-04-14 DIAGNOSIS — I739 Peripheral vascular disease, unspecified: Secondary | ICD-10-CM | POA: Diagnosis not present

## 2018-04-14 DIAGNOSIS — N183 Chronic kidney disease, stage 3 (moderate): Secondary | ICD-10-CM | POA: Diagnosis not present

## 2018-04-14 DIAGNOSIS — I639 Cerebral infarction, unspecified: Secondary | ICD-10-CM | POA: Diagnosis not present

## 2018-04-14 DIAGNOSIS — F0280 Dementia in other diseases classified elsewhere without behavioral disturbance: Secondary | ICD-10-CM | POA: Diagnosis not present

## 2018-04-14 LAB — GLUCOSE, CAPILLARY
Glucose-Capillary: 22 mg/dL — CL (ref 70–99)
Glucose-Capillary: 107 mg/dL — ABNORMAL HIGH (ref 70–99)
Glucose-Capillary: 160 mg/dL — ABNORMAL HIGH (ref 70–99)

## 2018-04-14 MED ORDER — PYRIDOSTIGMINE BROMIDE 60 MG PO TABS: 30.0000 mg | ORAL_TABLET | Freq: Two times a day (BID) | ORAL | 1 refills | Status: DC

## 2018-04-14 NOTE — Progress Notes (Signed)
Report called and given to [facility] at [facility]. All pt belongings gathered to be sent with him.

## 2018-04-14 NOTE — Discharge Summary (Signed)
Physician Discharge Summary  Harry [name] LEX:[id number] DOB: [birth year] DOA: 04/03/2018  PCP: [name], MD  Admit date: 04/03/2018 Discharge date: 04/14/2018  Admitted From: Home Disposition: Skilled nursing facility Recommendations for Outpatient Follow-up:  1. Follow up with PCP in 1-2 weeks 2. Please obtain BMP/CBC in one week  Home Health none Equipment/Devices none  Discharge Condition stable CODE STATUS full code Diet recommendation: Cardiac Brief/Interim Summary:73 y.o.malewith medical history significant ofCKD 3, hypertension, hyperlipidemia, PVD status post atherectomy, type 2 diabetes, CVA presenting to the hospital via EMS for evaluation of fallandrib pain.Per son at bedside, patient has a history of recurrent falls secondary to his blood pressure dropping. States patient was walking this morning lost his balance and fell. Again this evening he fell while walking and has been complaining of left-sided rib pain since then. Patient states he has been dizzy for the past 3 weeks. Denies having any chest pain or shortness of breath prior to the falls.Family states patient did not lose consciousness. They did not notice any seizure-like activity today.  Discharge Diagnoses:  Principal Problem:   Orthostatic hypotension Active Problems:   Essential hypertension   CKD (chronic kidney disease), stage III (HCC)   Falls   Hypokalemia  Recurrent falls/orthostatic hypotension -Unknown etiology, possibly dysautonomia given his history of diabetes -CT head as well as CT C-spine unremarkable for acute abnormality -Chest x-ray reviewed, negative for infection -EKG without acute changes, troponin was also unremarkable -Patient does have a significant drop in systolic blood pressure from sitting to standing. -Despite IV fluid hydration, patient remains orthostatic -Patient was not able to tolerate midodrine in the past given urinary symptoms -Discussed with  nephrology- feels this may be due to dysautonomia, may be worse when he eats and midodrine could be given at that time.  -Discussed this with daughter, it has not been associated with eating. Discussed referral to neurology for dysautonomia.  -PT and OT recommending SNF. Patient requiring moderate assistance for sitting to standing transfers as well as pivoting due to generalized weakness and impaired balance. Patient would benefit from further skilled physical therapy services to maximize independence and safety with mobility. -Continue thigh high hose, abdominal binder -Discussed with cardiology 04/12/2018, recommended discontinued florinef and trying mestinon -started on low dose mestinon 30mg  BID on 04/12/2018  Left-sided rib pain secondary to fall -Continue Tylenol as needed  Essential hypertension -Patient is on fludrocortisone  Normocytic anemia/anemia of chronic kidney disease -Hemoglobin currently 9, appears to be close to baseline -Anemia panel showed iron28,TIBC 265, ferritin 34, vitamin B12 168(placed on supplementation)  Acute kidney injury on chronic kidney disease, stage III -Resolved -Baseline creatinine 1.8-2 -Creatinine currently 1.52  Peripheral vascular disease/PAD -Continue home medications, aspirin and Plavix -Follow-up with vascular surgery  Hyperlipidemia -Continue statin Depression -Continue sertraline  Diabetes mellitus, type II -Hemoglobin A1c 7.1 -Patient has had some hypoglycemia, would continue home basal insulin at reduced dose along with sliding insulin scale -CBGs have been controlled  Hypokalemia -Replaced  Hypophosphatemia -resolved  Hypomagnesemia -replaced  Dementia -Continue to follow-up with neurology as an outpatient   Estimated body mass index is 26.01 kg/m as calculated from the following:   Height as of this encounter: 5\' 9"  (1.753 m).   Weight as of this encounter: 79.9 kg.  Discharge  Instructions  Discharge Instructions    Call MD for:  difficulty breathing, headache or visual disturbances   Complete by:  As directed    Call MD for:  persistant dizziness or light-headedness  Complete by:  As directed    Call MD for:  persistant nausea and vomiting   Complete by:  As directed    Call MD for:  temperature >100.4   Complete by:  As directed    Diet - low sodium heart healthy   Complete by:  As directed    Discharge instructions   Complete by:  As directed    Patient will be discharged to skilled nursing facility. Continue physical and occupational therapy.  Patient will need to follow up with primary care provider within one week of discharge, repeat BMP and magnesium. Discuss referral to neurologist specializing in dysautonomia. Patient should continue medications as prescribed.  Patient should follow a heart healthy/carb modified diet.   Increase activity slowly   Complete by:  As directed      Allergies as of 04/14/2018      Reactions   Lipitor [atorvastatin] Other (See Comments)   Myalgia   Statins Other (See Comments)   Myalgia (CAN tolerate Crestor, however)   Pravachol [pravastatin] Rash      Medication List    STOP taking these medications   divalproex 500 MG DR tablet Commonly known as:  DEPAKOTE     TAKE these medications   acetaminophen 500 MG tablet Commonly known as:  TYLENOL Take 1,000 mg by mouth every 6 (six) hours as needed (fever, headaches, or pain).   ARTIFICIAL TEARS 1.4 % ophthalmic solution Generic drug:  polyvinyl alcohol Place 1 drop into both eyes 2 (two) times daily as needed for dry eyes.   aspirin 81 MG EC tablet Take 1 tablet (81 mg total) by mouth daily.   BASAGLAR KWIKPEN 100 UNIT/ML Sopn Inject 0.14 mLs (14 Units total) into the skin at bedtime. What changed:  how much to take   clopidogrel 75 MG tablet Commonly known as:  PLAVIX Take 1 tablet (75 mg total) by mouth daily.   cyanocobalamin 1000 MCG tablet Take  1 tablet (1,000 mcg total) by mouth daily.   fludrocortisone 0.1 MG tablet Commonly known as:  FLORINEF Take 1 tablet (0.1 mg total) by mouth daily.   insulin aspart 100 UNIT/ML injection Commonly known as:  novoLOG Inject 0-9 Units into the skin 3 (three) times daily with meals. Sliding scale  CBG 70 - 120: 0 units  CBG 121 - 150: 1 unit,   CBG 151 - 200: 2 units,   CBG 201 - 250: 3 units,   CBG 251 - 300: 5 units,   CBG 301 - 350: 7 units,   CBG 351 - 400: 9 units   pyridostigmine 60 MG tablet Commonly known as:  MESTINON Take 0.5 tablets (30 mg total) by mouth 2 (two) times daily.   rosuvastatin 5 MG tablet Commonly known as:  CRESTOR Take 1 tablet (5 mg total) by mouth daily.   sertraline 25 MG tablet Commonly known as:  ZOLOFT TAKE ONE TABLET BY MOUTH DAILY       Contact information for follow-up providers    [name], MD. Schedule an appointment as soon as possible for a visit in 1 week(s).   Specialty:  Internal Medicine Why:  Hospital follow up Contact information: [place] [address] [phone]            Contact information for after-discharge care    Destination    [facility] .   Service:  Skilled Nursing Contact information: [address] [place] [phone]  Allergies  Allergen Reactions  . Lipitor [Atorvastatin] Other (See Comments)    Myalgia   . Statins Other (See Comments)    Myalgia (CAN tolerate Crestor, however)  . Pravachol [Pravastatin] Rash    Consultations: None   Procedures/Studies: Dg Chest 2 View  Result Date: 04/03/2018 CLINICAL DATA:  Patient fell today after standing up from bed. Dizziness. EXAM: CHEST - 2 VIEW COMPARISON:  November 15, 2017 FINDINGS: The heart size and mediastinal contours are within normal limits. Both lungs are clear. The visualized skeletal structures are unremarkable. IMPRESSION: No  active cardiopulmonary disease. Electronically Signed   By: Dorise Bullion III M.D   On: 04/03/2018 21:41   Ct Head Wo Contrast  Result Date: 04/03/2018 CLINICAL DATA:  Initial evaluation for acute dizziness, fall. EXAM: CT HEAD WITHOUT CONTRAST CT CERVICAL SPINE WITHOUT CONTRAST TECHNIQUE: Multidetector CT imaging of the head and cervical spine was performed following the standard protocol without intravenous contrast. Multiplanar CT image reconstructions of the cervical spine were also generated. COMPARISON:  Prior CT from 02/25/2018 FINDINGS: CT HEAD FINDINGS Brain: Age-related cerebral atrophy with chronic microvascular ischemic disease. Remote left PCA territory infarct involving the left occipital lobe. Additional smaller remote right PCA territory infarct involving the right occipital lobe. Chronic bilateral basal ganglia and thalamic lacunar infarcts. Bilateral cerebellar infarcts, left greater than right. Appearance is stable from previous. No acute intracranial hemorrhage. No acute large vessel territory infarct. No mass lesion, midline shift or mass effect. No hydrocephalus. No extra-axial fluid collection. Vascular: No hyperdense vessel. Scattered vascular calcifications noted within the carotid siphons. Skull: Scalp soft tissues and calvarium within normal limits. Sinuses/Orbits: Globes and orbital soft tissues normal. Paranasal sinuses and mastoid air cells are clear. Other: None. CT CERVICAL SPINE FINDINGS Alignment: Straightening of the normal cervical lordosis. No listhesis or malalignment. Skull base and vertebrae: Skull base intact. Normal C1-2 articulations are preserved in the dens is intact. Vertebral body heights maintained. No acute fracture. Tiny osseous density adjacent to the superior articular process of the right C3 facet appears chronic in nature, likely degenerative. Soft tissues and spinal canal: Soft tissues of the neck demonstrate no acute finding. No abnormal prevertebral  edema. Spinal canal within normal limits. Vascular calcifications about the carotid bifurcations. Disc levels: Central disc protrusion at C3-4 with mild to moderate spinal stenosis. Left paracentral disc protrusion at C4-5 with moderate spinal stenosis. Additional mild disc bulging at C5-6 and C6-7. Upper chest: Visualized upper chest demonstrates no acute finding. Visualized lung apices are clear. Mild centrilobular emphysema noted. Other: None. IMPRESSION: CT BRAIN: 1. No acute intracranial abnormality. 2. Age-related cerebral atrophy with chronic small vessel ischemic disease with multifocal posterior ischemic infarcts as above, stable. CT CERVICAL SPINE: No acute traumatic injury within the cervical spine. Electronically Signed   By: Jeannine Boga M.D.   On: 04/03/2018 21:41   Ct Cervical Spine Wo Contrast  Result Date: 04/03/2018 CLINICAL DATA:  Initial evaluation for acute dizziness, fall. EXAM: CT HEAD WITHOUT CONTRAST CT CERVICAL SPINE WITHOUT CONTRAST TECHNIQUE: Multidetector CT imaging of the head and cervical spine was performed following the standard protocol without intravenous contrast. Multiplanar CT image reconstructions of the cervical spine were also generated. COMPARISON:  Prior CT from 02/25/2018 FINDINGS: CT HEAD FINDINGS Brain: Age-related cerebral atrophy with chronic microvascular ischemic disease. Remote left PCA territory infarct involving the left occipital lobe. Additional smaller remote right PCA territory infarct involving the right occipital lobe. Chronic bilateral basal ganglia and thalamic lacunar infarcts. Bilateral cerebellar infarcts,  left greater than right. Appearance is stable from previous. No acute intracranial hemorrhage. No acute large vessel territory infarct. No mass lesion, midline shift or mass effect. No hydrocephalus. No extra-axial fluid collection. Vascular: No hyperdense vessel. Scattered vascular calcifications noted within the carotid siphons. Skull:  Scalp soft tissues and calvarium within normal limits. Sinuses/Orbits: Globes and orbital soft tissues normal. Paranasal sinuses and mastoid air cells are clear. Other: None. CT CERVICAL SPINE FINDINGS Alignment: Straightening of the normal cervical lordosis. No listhesis or malalignment. Skull base and vertebrae: Skull base intact. Normal C1-2 articulations are preserved in the dens is intact. Vertebral body heights maintained. No acute fracture. Tiny osseous density adjacent to the superior articular process of the right C3 facet appears chronic in nature, likely degenerative. Soft tissues and spinal canal: Soft tissues of the neck demonstrate no acute finding. No abnormal prevertebral edema. Spinal canal within normal limits. Vascular calcifications about the carotid bifurcations. Disc levels: Central disc protrusion at C3-4 with mild to moderate spinal stenosis. Left paracentral disc protrusion at C4-5 with moderate spinal stenosis. Additional mild disc bulging at C5-6 and C6-7. Upper chest: Visualized upper chest demonstrates no acute finding. Visualized lung apices are clear. Mild centrilobular emphysema noted. Other: None. IMPRESSION: CT BRAIN: 1. No acute intracranial abnormality. 2. Age-related cerebral atrophy with chronic small vessel ischemic disease with multifocal posterior ischemic infarcts as above, stable. CT CERVICAL SPINE: No acute traumatic injury within the cervical spine. Electronically Signed   By: Jeannine Boga M.D.   On: 04/03/2018 21:41    (Echo, Carotid, EGD, Colonoscopy, ERCP)    Subjective: Patient resting in bed anxious to be released in the hospital    Discharge Exam: Vitals:   04/14/18 0903 04/14/18 0958  BP: (!) 143/76 (!) 179/90  Pulse: 89 86  Resp: 17   Temp: 97.9 F (36.6 C)   SpO2: 100%    Vitals:   04/13/18 1941 04/14/18 0322 04/14/18 0903 04/14/18 0958  BP: (!) 148/83 (!) 155/80 (!) 143/76 (!) 179/90  Pulse: 85 86 89 86  Resp: 18  17   Temp: 97.8  F (36.6 C) 98.5 F (36.9 C) 97.9 F (36.6 C)   TempSrc: Oral Oral Oral   SpO2: 99% 95% 100%   Weight:      Height:        General: Pt is alert, awake, not in acute distress Cardiovascular: RRR, S1/S2 +, no rubs, no gallops Respiratory: CTA bilaterally, no wheezing, no rhonchi Abdominal: Soft, NT, ND, bowel sounds + Extremities: no edema, no cyanosis    The results of significant diagnostics from this hospitalization (including imaging, microbiology, ancillary and laboratory) are listed below for reference.     Microbiology: No results found for this or any previous visit (from the past 240 hour(s)).   Labs: BNP (last 3 results) No results for input(s): BNP in the last 8760 hours. Basic Metabolic Panel: Recent Labs  Lab 04/08/18 0354 04/09/18 0443 04/10/18 1205 04/13/18 0723  NA 141 138 140 139  K 3.6 3.4* 3.5 3.7  CL 110 105 104 107  CO2 24 24 25 24   GLUCOSE 164* 96 119* 106*  BUN 21 18 16  29*  CREATININE 1.65* 1.52* 1.51* 1.44*  CALCIUM 8.3* 8.4* 8.6* 8.7*  MG 1.7 1.5*  --  1.7  PHOS 3.0 2.9  --   --    Liver Function Tests: Recent Labs  Lab 04/08/18 0354 04/09/18 0443  AST 19 16  ALT 17 15  ALKPHOS 51 45  BILITOT 0.8 0.8  PROT 6.3* 6.2*  ALBUMIN 2.9* 2.9*   No results for input(s): LIPASE, AMYLASE in the last 168 hours. No results for input(s): AMMONIA in the last 168 hours. CBC: Recent Labs  Lab 04/08/18 0354 04/09/18 0443  WBC 5.1 5.3  NEUTROABS 2.3 2.1  HGB 9.3* 9.0*  HCT 28.1* 28.5*  MCV 92.1 92.2  PLT 217 222   Cardiac Enzymes: No results for input(s): CKTOTAL, CKMB, CKMBINDEX, TROPONINI in the last 168 hours. BNP: Invalid input(s): POCBNP CBG: Recent Labs  Lab 04/13/18 1126 04/13/18 1631 04/13/18 1918 04/14/18 0734 04/14/18 1120  GLUCAP 191* 172* 124* 107* 160*   D-Dimer No results for input(s): DDIMER in the last 72 hours. Hgb A1c No results for input(s): HGBA1C in the last 72 hours. Lipid Profile No results for  input(s): CHOL, HDL, LDLCALC, TRIG, CHOLHDL, LDLDIRECT in the last 72 hours. Thyroid function studies No results for input(s): TSH, T4TOTAL, T3FREE, THYROIDAB in the last 72 hours.  Invalid input(s): FREET3 Anemia work up No results for input(s): VITAMINB12, FOLATE, FERRITIN, TIBC, IRON, RETICCTPCT in the last 72 hours. Urinalysis    Component Value Date/Time   COLORURINE YELLOW 02/24/2018 1455   APPEARANCEUR CLEAR 02/24/2018 1455   LABSPEC 1.015 02/24/2018 1455   PHURINE 6.0 02/24/2018 1455   GLUCOSEU >=500 (A) 02/24/2018 1455   HGBUR TRACE (A) 02/24/2018 1455   BILIRUBINUR NEGATIVE 02/24/2018 1455   KETONESUR NEGATIVE 02/24/2018 1455   PROTEINUR NEGATIVE 02/24/2018 1455   UROBILINOGEN 0.2 10/09/2014 2112   NITRITE NEGATIVE 02/24/2018 1455   LEUKOCYTESUR NEGATIVE 02/24/2018 1455   Sepsis Labs Invalid input(s): PROCALCITONIN,  WBC,  LACTICIDVEN Microbiology No results found for this or any previous visit (from the past 240 hour(s)).   Time coordinating discharge: 33  minutes  SIGNED:   Georgette Shell, MD  [facility] 04/14/2018, 11:44 AM Pager   If 7PM-7AM, please contact night-coverage [email or web address] Password [email or web address]

## 2018-04-14 NOTE — Progress Notes (Signed)
Patient will DC HF[id number] Anticipated DC date:04/14/2018 Family notified:daughter Transport YY:YHHT  Per MD patient ready for DC to [facility] . RN, patient, patient's family, and facility notified of DC. Discharge Summary sent to facility. RN given number for report [phone]. DC packet on chart. Ambulance transport requested for patient.  CSW signing off.  [place], [place]

## 2018-04-16 DIAGNOSIS — R262 Difficulty in walking, not elsewhere classified: Secondary | ICD-10-CM | POA: Diagnosis not present

## 2018-04-16 DIAGNOSIS — R4189 Other symptoms and signs involving cognitive functions and awareness: Secondary | ICD-10-CM | POA: Diagnosis not present

## 2018-04-16 DIAGNOSIS — R5381 Other malaise: Secondary | ICD-10-CM | POA: Diagnosis not present

## 2018-04-16 DIAGNOSIS — M6281 Muscle weakness (generalized): Secondary | ICD-10-CM | POA: Diagnosis not present

## 2018-04-17 DIAGNOSIS — G3183 Dementia with Lewy bodies: Secondary | ICD-10-CM | POA: Diagnosis not present

## 2018-04-17 DIAGNOSIS — H269 Unspecified cataract: Secondary | ICD-10-CM | POA: Diagnosis not present

## 2018-04-17 DIAGNOSIS — H5712 Ocular pain, left eye: Secondary | ICD-10-CM | POA: Diagnosis not present

## 2018-04-23 ENCOUNTER — Other Ambulatory Visit: Payer: Self-pay | Admitting: *Deleted

## 2018-04-23 NOTE — Patient Outreach (Signed)
[facility]) Care Management  04/23/2018  Harry [name] April 06, 1945 [id number]   Onsite visit to facility. Attempted to meet with patient, he was not in his room x 2 attempts. Met with Marita Kansas and let her know patient was eligible for [facility] care management services. No discharge date is set as of yet.  Discharge plan is home with daughter.  Plan to monitor patient for any [facility] care management needs.  Royetta Crochet. Laymond Purser, MSN, RN, [employer] , [place] [phone]) Business Cell  [phone]) Toll Free Office

## 2018-04-30 ENCOUNTER — Other Ambulatory Visit: Payer: Self-pay | Admitting: *Deleted

## 2018-04-30 NOTE — Patient Outreach (Signed)
[facility] Care Management  04/30/2018  Harry [name] January 26, 1946 [id number]   Onsite visit to [employer].  Harry [name], discharge planner reports patient will probably discharge next week.  He will go home with daughter.   Met with patient at bedside.  Patient reports he has a home in [place], he confirms Dr. Legrand Rams is his primary care physician.  Patient reports he has been staying with his daughter in [place]. He states she assists him with medical transportation and with medication management.  Patient reports he has been doing well with therapy. He feels he will go home soon.   RNCM reviewed [facility] care management program with patient. Patient reports he will review and discuss with his daughter.  Left packet and RNCM contact. Plan to follow up at next facility visit. Harry [name]. Harry Purser, MSN, NP, AGNP-C, [place] [phone]) Business Cell  [phone]) Toll Free Office

## 2018-05-06 ENCOUNTER — Telehealth: Payer: Self-pay | Admitting: Diagnostic Neuroimaging

## 2018-05-06 NOTE — Telephone Encounter (Signed)
Called daughter, [name] and advised her of the call from Tammie and Dr Gladstone Lighter reply. She stated she didn't know why Tammie called this office. She stated she had understood from Dr Leta Baptist in Jan appointment the care he recommended, and she agreed to the plan of care going forward. She stated she would discuss with Tammie, thanked me for the call.

## 2018-05-06 NOTE — Telephone Encounter (Signed)
Recommend PCP, palliative care and cardiology follow up. No further recommendations from my standpoint. -VRP

## 2018-05-06 NOTE — Telephone Encounter (Signed)
[facility]. She stated the patient was admitted there on 04/25/18 from the hospital. She is unsure how long he will stay. Per hospital discharge note the patient is to see a neurologist specializing in dysautonomia.  I asked if a family member could call to schedule appointment after patient is discharged and stable. He has daughter, [name] and Jones Broom will advise daughter to call for appointment after his discharge from rehab. Tammie verbalized understanding, appreciation.  Will make Dr Leta Baptist aware.

## 2018-05-06 NOTE — Telephone Encounter (Signed)
Tammie from [facility] called needing to schedule a hospital f/u for the pt and is needing something sooner than what was offered. Please advise.

## 2018-05-08 ENCOUNTER — Other Ambulatory Visit: Payer: Self-pay | Admitting: *Deleted

## 2018-05-08 NOTE — Patient Outreach (Signed)
[facility]) Care Management  05/08/2018  GREY RAKESTRAW December 15, 1945 [id number]   Onsite visit to [employer].  Met with Marita Kansas, discharge planner.  She reports patient is doing well and will be discharge home with his daughter on Saturday, they are working on getting independence with steps. She is setting up home care services.   Attempted to meet with patient for follow up.  He was not in his room x 2 attempts. Will sign off, left [facility] packet at last onsite visit and reviewed [facility] program services, he has contact information.  Royetta Crochet. Laymond Purser, MSN, RN, [employer] , [place] [phone]) Business Cell  [phone]) Toll Free Office

## 2018-05-12 DIAGNOSIS — E1122 Type 2 diabetes mellitus with diabetic chronic kidney disease: Secondary | ICD-10-CM | POA: Diagnosis not present

## 2018-05-12 DIAGNOSIS — R296 Repeated falls: Secondary | ICD-10-CM | POA: Diagnosis not present

## 2018-05-12 DIAGNOSIS — I951 Orthostatic hypotension: Secondary | ICD-10-CM | POA: Diagnosis not present

## 2018-05-12 DIAGNOSIS — G3183 Dementia with Lewy bodies: Secondary | ICD-10-CM | POA: Diagnosis not present

## 2018-05-16 DIAGNOSIS — M4800 Spinal stenosis, site unspecified: Secondary | ICD-10-CM | POA: Diagnosis not present

## 2018-05-16 DIAGNOSIS — I129 Hypertensive chronic kidney disease with stage 1 through stage 4 chronic kidney disease, or unspecified chronic kidney disease: Secondary | ICD-10-CM | POA: Diagnosis not present

## 2018-05-16 DIAGNOSIS — D631 Anemia in chronic kidney disease: Secondary | ICD-10-CM | POA: Diagnosis not present

## 2018-05-16 DIAGNOSIS — I69959 Hemiplegia and hemiparesis following unspecified cerebrovascular disease affecting unspecified side: Secondary | ICD-10-CM | POA: Diagnosis not present

## 2018-05-16 DIAGNOSIS — Z794 Long term (current) use of insulin: Secondary | ICD-10-CM | POA: Diagnosis not present

## 2018-05-16 DIAGNOSIS — E1151 Type 2 diabetes mellitus with diabetic peripheral angiopathy without gangrene: Secondary | ICD-10-CM | POA: Diagnosis not present

## 2018-05-16 DIAGNOSIS — G3183 Dementia with Lewy bodies: Secondary | ICD-10-CM | POA: Diagnosis not present

## 2018-05-16 DIAGNOSIS — E1122 Type 2 diabetes mellitus with diabetic chronic kidney disease: Secondary | ICD-10-CM | POA: Diagnosis not present

## 2018-05-16 DIAGNOSIS — R55 Syncope and collapse: Secondary | ICD-10-CM | POA: Diagnosis not present

## 2018-05-16 DIAGNOSIS — W19XXXA Unspecified fall, initial encounter: Secondary | ICD-10-CM | POA: Diagnosis not present

## 2018-05-16 DIAGNOSIS — M6281 Muscle weakness (generalized): Secondary | ICD-10-CM | POA: Diagnosis not present

## 2018-05-16 DIAGNOSIS — E785 Hyperlipidemia, unspecified: Secondary | ICD-10-CM | POA: Diagnosis not present

## 2018-05-16 DIAGNOSIS — N183 Chronic kidney disease, stage 3 (moderate): Secondary | ICD-10-CM | POA: Diagnosis not present

## 2018-05-16 DIAGNOSIS — E876 Hypokalemia: Secondary | ICD-10-CM | POA: Diagnosis not present

## 2018-05-16 DIAGNOSIS — I70209 Unspecified atherosclerosis of native arteries of extremities, unspecified extremity: Secondary | ICD-10-CM | POA: Diagnosis not present

## 2018-05-16 DIAGNOSIS — F0280 Dementia in other diseases classified elsewhere without behavioral disturbance: Secondary | ICD-10-CM | POA: Diagnosis not present

## 2018-05-16 DIAGNOSIS — I951 Orthostatic hypotension: Secondary | ICD-10-CM | POA: Diagnosis not present

## 2018-05-16 DIAGNOSIS — Z9181 History of falling: Secondary | ICD-10-CM | POA: Diagnosis not present

## 2018-05-16 DIAGNOSIS — Z7982 Long term (current) use of aspirin: Secondary | ICD-10-CM | POA: Diagnosis not present

## 2018-05-19 DIAGNOSIS — I951 Orthostatic hypotension: Secondary | ICD-10-CM | POA: Diagnosis not present

## 2018-05-19 DIAGNOSIS — E1143 Type 2 diabetes mellitus with diabetic autonomic (poly)neuropathy: Secondary | ICD-10-CM | POA: Diagnosis not present

## 2018-05-19 DIAGNOSIS — F17200 Nicotine dependence, unspecified, uncomplicated: Secondary | ICD-10-CM | POA: Diagnosis not present

## 2018-05-19 DIAGNOSIS — M5126 Other intervertebral disc displacement, lumbar region: Secondary | ICD-10-CM | POA: Diagnosis not present

## 2018-05-20 ENCOUNTER — Telehealth: Payer: Self-pay | Admitting: Cardiovascular Disease

## 2018-05-20 ENCOUNTER — Telehealth: Payer: Self-pay | Admitting: Diagnostic Neuroimaging

## 2018-05-20 DIAGNOSIS — F0280 Dementia in other diseases classified elsewhere without behavioral disturbance: Secondary | ICD-10-CM | POA: Diagnosis not present

## 2018-05-20 DIAGNOSIS — I69959 Hemiplegia and hemiparesis following unspecified cerebrovascular disease affecting unspecified side: Secondary | ICD-10-CM | POA: Diagnosis not present

## 2018-05-20 DIAGNOSIS — I129 Hypertensive chronic kidney disease with stage 1 through stage 4 chronic kidney disease, or unspecified chronic kidney disease: Secondary | ICD-10-CM | POA: Diagnosis not present

## 2018-05-20 DIAGNOSIS — G3183 Dementia with Lewy bodies: Secondary | ICD-10-CM | POA: Diagnosis not present

## 2018-05-20 DIAGNOSIS — I951 Orthostatic hypotension: Secondary | ICD-10-CM | POA: Diagnosis not present

## 2018-05-20 DIAGNOSIS — E876 Hypokalemia: Secondary | ICD-10-CM | POA: Diagnosis not present

## 2018-05-20 NOTE — Telephone Encounter (Signed)
Patient's daughter is calling because she was advised by her father's PCP to call us to find out if he should stop pyridostigmine (MESTINON) 60 MG tablet. His BP was high yesterday at the PCP's office.  The homecare nurse came today checked his BP and it was 148/92.

## 2018-05-20 NOTE — Telephone Encounter (Signed)
Patients daughter called in and wanted to be sure it was ok for patient to continue taking pyridostigmine (MESTINON) 60 MG tablet

## 2018-05-20 NOTE — Telephone Encounter (Signed)
Spoke with pt's daughter who state yesterday pt was seen at pcp office and BP was 196/112. She report today with automatic machine it was 198/102 and manually 148/92. Daughter states she was instructed by pcp to contact cardiologist office as to whether pt should stop mestinon. Will route to Pharm D and MD.

## 2018-05-20 NOTE — Telephone Encounter (Signed)
Spoke with daughter.  They had not checked his home readings until yesterday when it was high at PCP.  Home automatic vs manual were different by 50/10 points.  Asked daughter to continue with all current meds, and check BP manually twice daily.  Call back on Friday with those readings and we can then determine best course of action.  Daughter in agreement.

## 2018-05-20 NOTE — Telephone Encounter (Signed)
Received call back from [place] who stated patient's PCP did not know how to mange mestinon and advised [name] call neurologist. In 04/24/18 hospital discharge note from Dr Coralee Pesa, it stated she discussed discontinuing Florinef with cardiologist and prescribing Mestinon. I advised [name] call patient's cardiologist. [name] stated patient is on both medications, stated she would call cardiologist. She verbalized understanding, appreciation.

## 2018-05-20 NOTE — Telephone Encounter (Signed)
LVM advising daughter [name] Dr Stark Klein wouldn't manage Mestinon as he wasn't the prescribing physician. Advised she discuss it with patient's PCP.  Left number for furhter questions.

## 2018-05-21 DIAGNOSIS — E876 Hypokalemia: Secondary | ICD-10-CM | POA: Diagnosis not present

## 2018-05-21 DIAGNOSIS — I69959 Hemiplegia and hemiparesis following unspecified cerebrovascular disease affecting unspecified side: Secondary | ICD-10-CM | POA: Diagnosis not present

## 2018-05-21 DIAGNOSIS — F0280 Dementia in other diseases classified elsewhere without behavioral disturbance: Secondary | ICD-10-CM | POA: Diagnosis not present

## 2018-05-21 DIAGNOSIS — I129 Hypertensive chronic kidney disease with stage 1 through stage 4 chronic kidney disease, or unspecified chronic kidney disease: Secondary | ICD-10-CM | POA: Diagnosis not present

## 2018-05-21 DIAGNOSIS — I951 Orthostatic hypotension: Secondary | ICD-10-CM | POA: Diagnosis not present

## 2018-05-21 DIAGNOSIS — G3183 Dementia with Lewy bodies: Secondary | ICD-10-CM | POA: Diagnosis not present

## 2018-05-23 DIAGNOSIS — I951 Orthostatic hypotension: Secondary | ICD-10-CM | POA: Diagnosis not present

## 2018-05-23 DIAGNOSIS — I69959 Hemiplegia and hemiparesis following unspecified cerebrovascular disease affecting unspecified side: Secondary | ICD-10-CM | POA: Diagnosis not present

## 2018-05-23 DIAGNOSIS — F0280 Dementia in other diseases classified elsewhere without behavioral disturbance: Secondary | ICD-10-CM | POA: Diagnosis not present

## 2018-05-23 DIAGNOSIS — E876 Hypokalemia: Secondary | ICD-10-CM | POA: Diagnosis not present

## 2018-05-23 DIAGNOSIS — G3183 Dementia with Lewy bodies: Secondary | ICD-10-CM | POA: Diagnosis not present

## 2018-05-23 DIAGNOSIS — I129 Hypertensive chronic kidney disease with stage 1 through stage 4 chronic kidney disease, or unspecified chronic kidney disease: Secondary | ICD-10-CM | POA: Diagnosis not present

## 2018-05-27 DIAGNOSIS — E876 Hypokalemia: Secondary | ICD-10-CM | POA: Diagnosis not present

## 2018-05-27 DIAGNOSIS — I129 Hypertensive chronic kidney disease with stage 1 through stage 4 chronic kidney disease, or unspecified chronic kidney disease: Secondary | ICD-10-CM | POA: Diagnosis not present

## 2018-05-27 DIAGNOSIS — I951 Orthostatic hypotension: Secondary | ICD-10-CM | POA: Diagnosis not present

## 2018-05-27 DIAGNOSIS — F0280 Dementia in other diseases classified elsewhere without behavioral disturbance: Secondary | ICD-10-CM | POA: Diagnosis not present

## 2018-05-27 DIAGNOSIS — I69959 Hemiplegia and hemiparesis following unspecified cerebrovascular disease affecting unspecified side: Secondary | ICD-10-CM | POA: Diagnosis not present

## 2018-05-27 DIAGNOSIS — G3183 Dementia with Lewy bodies: Secondary | ICD-10-CM | POA: Diagnosis not present

## 2018-05-28 DIAGNOSIS — I951 Orthostatic hypotension: Secondary | ICD-10-CM | POA: Diagnosis not present

## 2018-05-28 DIAGNOSIS — G3183 Dementia with Lewy bodies: Secondary | ICD-10-CM | POA: Diagnosis not present

## 2018-05-28 DIAGNOSIS — E876 Hypokalemia: Secondary | ICD-10-CM | POA: Diagnosis not present

## 2018-05-28 DIAGNOSIS — F0280 Dementia in other diseases classified elsewhere without behavioral disturbance: Secondary | ICD-10-CM | POA: Diagnosis not present

## 2018-05-28 DIAGNOSIS — I129 Hypertensive chronic kidney disease with stage 1 through stage 4 chronic kidney disease, or unspecified chronic kidney disease: Secondary | ICD-10-CM | POA: Diagnosis not present

## 2018-05-28 DIAGNOSIS — I69959 Hemiplegia and hemiparesis following unspecified cerebrovascular disease affecting unspecified side: Secondary | ICD-10-CM | POA: Diagnosis not present

## 2018-05-30 DIAGNOSIS — G3183 Dementia with Lewy bodies: Secondary | ICD-10-CM | POA: Diagnosis not present

## 2018-05-30 DIAGNOSIS — I951 Orthostatic hypotension: Secondary | ICD-10-CM | POA: Diagnosis not present

## 2018-05-30 DIAGNOSIS — F0280 Dementia in other diseases classified elsewhere without behavioral disturbance: Secondary | ICD-10-CM | POA: Diagnosis not present

## 2018-05-30 DIAGNOSIS — I129 Hypertensive chronic kidney disease with stage 1 through stage 4 chronic kidney disease, or unspecified chronic kidney disease: Secondary | ICD-10-CM | POA: Diagnosis not present

## 2018-05-30 DIAGNOSIS — E876 Hypokalemia: Secondary | ICD-10-CM | POA: Diagnosis not present

## 2018-05-30 DIAGNOSIS — I69959 Hemiplegia and hemiparesis following unspecified cerebrovascular disease affecting unspecified side: Secondary | ICD-10-CM | POA: Diagnosis not present

## 2018-06-02 DIAGNOSIS — I951 Orthostatic hypotension: Secondary | ICD-10-CM | POA: Diagnosis not present

## 2018-06-02 DIAGNOSIS — I69959 Hemiplegia and hemiparesis following unspecified cerebrovascular disease affecting unspecified side: Secondary | ICD-10-CM | POA: Diagnosis not present

## 2018-06-02 DIAGNOSIS — G3183 Dementia with Lewy bodies: Secondary | ICD-10-CM | POA: Diagnosis not present

## 2018-06-02 DIAGNOSIS — F0280 Dementia in other diseases classified elsewhere without behavioral disturbance: Secondary | ICD-10-CM | POA: Diagnosis not present

## 2018-06-02 DIAGNOSIS — I129 Hypertensive chronic kidney disease with stage 1 through stage 4 chronic kidney disease, or unspecified chronic kidney disease: Secondary | ICD-10-CM | POA: Diagnosis not present

## 2018-06-02 DIAGNOSIS — E876 Hypokalemia: Secondary | ICD-10-CM | POA: Diagnosis not present

## 2018-06-03 DIAGNOSIS — G3183 Dementia with Lewy bodies: Secondary | ICD-10-CM | POA: Diagnosis not present

## 2018-06-03 DIAGNOSIS — I129 Hypertensive chronic kidney disease with stage 1 through stage 4 chronic kidney disease, or unspecified chronic kidney disease: Secondary | ICD-10-CM | POA: Diagnosis not present

## 2018-06-03 DIAGNOSIS — I69959 Hemiplegia and hemiparesis following unspecified cerebrovascular disease affecting unspecified side: Secondary | ICD-10-CM | POA: Diagnosis not present

## 2018-06-03 DIAGNOSIS — E876 Hypokalemia: Secondary | ICD-10-CM | POA: Diagnosis not present

## 2018-06-03 DIAGNOSIS — I951 Orthostatic hypotension: Secondary | ICD-10-CM | POA: Diagnosis not present

## 2018-06-03 DIAGNOSIS — F0280 Dementia in other diseases classified elsewhere without behavioral disturbance: Secondary | ICD-10-CM | POA: Diagnosis not present

## 2018-06-04 DIAGNOSIS — I129 Hypertensive chronic kidney disease with stage 1 through stage 4 chronic kidney disease, or unspecified chronic kidney disease: Secondary | ICD-10-CM | POA: Diagnosis not present

## 2018-06-04 DIAGNOSIS — I951 Orthostatic hypotension: Secondary | ICD-10-CM | POA: Diagnosis not present

## 2018-06-04 DIAGNOSIS — F0280 Dementia in other diseases classified elsewhere without behavioral disturbance: Secondary | ICD-10-CM | POA: Diagnosis not present

## 2018-06-04 DIAGNOSIS — G3183 Dementia with Lewy bodies: Secondary | ICD-10-CM | POA: Diagnosis not present

## 2018-06-04 DIAGNOSIS — E876 Hypokalemia: Secondary | ICD-10-CM | POA: Diagnosis not present

## 2018-06-04 DIAGNOSIS — I69959 Hemiplegia and hemiparesis following unspecified cerebrovascular disease affecting unspecified side: Secondary | ICD-10-CM | POA: Diagnosis not present

## 2018-06-06 DIAGNOSIS — I951 Orthostatic hypotension: Secondary | ICD-10-CM | POA: Diagnosis not present

## 2018-06-06 DIAGNOSIS — I69959 Hemiplegia and hemiparesis following unspecified cerebrovascular disease affecting unspecified side: Secondary | ICD-10-CM | POA: Diagnosis not present

## 2018-06-06 DIAGNOSIS — I129 Hypertensive chronic kidney disease with stage 1 through stage 4 chronic kidney disease, or unspecified chronic kidney disease: Secondary | ICD-10-CM | POA: Diagnosis not present

## 2018-06-06 DIAGNOSIS — F0280 Dementia in other diseases classified elsewhere without behavioral disturbance: Secondary | ICD-10-CM | POA: Diagnosis not present

## 2018-06-06 DIAGNOSIS — E876 Hypokalemia: Secondary | ICD-10-CM | POA: Diagnosis not present

## 2018-06-06 DIAGNOSIS — G3183 Dementia with Lewy bodies: Secondary | ICD-10-CM | POA: Diagnosis not present

## 2018-06-09 DIAGNOSIS — G3183 Dementia with Lewy bodies: Secondary | ICD-10-CM | POA: Diagnosis not present

## 2018-06-09 DIAGNOSIS — I69959 Hemiplegia and hemiparesis following unspecified cerebrovascular disease affecting unspecified side: Secondary | ICD-10-CM | POA: Diagnosis not present

## 2018-06-09 DIAGNOSIS — E876 Hypokalemia: Secondary | ICD-10-CM | POA: Diagnosis not present

## 2018-06-09 DIAGNOSIS — F0280 Dementia in other diseases classified elsewhere without behavioral disturbance: Secondary | ICD-10-CM | POA: Diagnosis not present

## 2018-06-09 DIAGNOSIS — I129 Hypertensive chronic kidney disease with stage 1 through stage 4 chronic kidney disease, or unspecified chronic kidney disease: Secondary | ICD-10-CM | POA: Diagnosis not present

## 2018-06-09 DIAGNOSIS — I951 Orthostatic hypotension: Secondary | ICD-10-CM | POA: Diagnosis not present

## 2018-06-10 DIAGNOSIS — I69959 Hemiplegia and hemiparesis following unspecified cerebrovascular disease affecting unspecified side: Secondary | ICD-10-CM | POA: Diagnosis not present

## 2018-06-10 DIAGNOSIS — G3183 Dementia with Lewy bodies: Secondary | ICD-10-CM | POA: Diagnosis not present

## 2018-06-10 DIAGNOSIS — I951 Orthostatic hypotension: Secondary | ICD-10-CM | POA: Diagnosis not present

## 2018-06-10 DIAGNOSIS — F0280 Dementia in other diseases classified elsewhere without behavioral disturbance: Secondary | ICD-10-CM | POA: Diagnosis not present

## 2018-06-10 DIAGNOSIS — I129 Hypertensive chronic kidney disease with stage 1 through stage 4 chronic kidney disease, or unspecified chronic kidney disease: Secondary | ICD-10-CM | POA: Diagnosis not present

## 2018-06-10 DIAGNOSIS — E876 Hypokalemia: Secondary | ICD-10-CM | POA: Diagnosis not present

## 2018-06-11 DIAGNOSIS — I951 Orthostatic hypotension: Secondary | ICD-10-CM | POA: Diagnosis not present

## 2018-06-11 DIAGNOSIS — G3183 Dementia with Lewy bodies: Secondary | ICD-10-CM | POA: Diagnosis not present

## 2018-06-11 DIAGNOSIS — E876 Hypokalemia: Secondary | ICD-10-CM | POA: Diagnosis not present

## 2018-06-11 DIAGNOSIS — I129 Hypertensive chronic kidney disease with stage 1 through stage 4 chronic kidney disease, or unspecified chronic kidney disease: Secondary | ICD-10-CM | POA: Diagnosis not present

## 2018-06-11 DIAGNOSIS — F0280 Dementia in other diseases classified elsewhere without behavioral disturbance: Secondary | ICD-10-CM | POA: Diagnosis not present

## 2018-06-11 DIAGNOSIS — I69959 Hemiplegia and hemiparesis following unspecified cerebrovascular disease affecting unspecified side: Secondary | ICD-10-CM | POA: Diagnosis not present

## 2018-06-15 DIAGNOSIS — I951 Orthostatic hypotension: Secondary | ICD-10-CM | POA: Diagnosis not present

## 2018-06-15 DIAGNOSIS — M4800 Spinal stenosis, site unspecified: Secondary | ICD-10-CM | POA: Diagnosis not present

## 2018-06-15 DIAGNOSIS — Z7982 Long term (current) use of aspirin: Secondary | ICD-10-CM | POA: Diagnosis not present

## 2018-06-15 DIAGNOSIS — F0280 Dementia in other diseases classified elsewhere without behavioral disturbance: Secondary | ICD-10-CM | POA: Diagnosis not present

## 2018-06-15 DIAGNOSIS — E785 Hyperlipidemia, unspecified: Secondary | ICD-10-CM | POA: Diagnosis not present

## 2018-06-15 DIAGNOSIS — M6281 Muscle weakness (generalized): Secondary | ICD-10-CM | POA: Diagnosis not present

## 2018-06-15 DIAGNOSIS — I129 Hypertensive chronic kidney disease with stage 1 through stage 4 chronic kidney disease, or unspecified chronic kidney disease: Secondary | ICD-10-CM | POA: Diagnosis not present

## 2018-06-15 DIAGNOSIS — E1122 Type 2 diabetes mellitus with diabetic chronic kidney disease: Secondary | ICD-10-CM | POA: Diagnosis not present

## 2018-06-15 DIAGNOSIS — D631 Anemia in chronic kidney disease: Secondary | ICD-10-CM | POA: Diagnosis not present

## 2018-06-15 DIAGNOSIS — R55 Syncope and collapse: Secondary | ICD-10-CM | POA: Diagnosis not present

## 2018-06-15 DIAGNOSIS — Z794 Long term (current) use of insulin: Secondary | ICD-10-CM | POA: Diagnosis not present

## 2018-06-15 DIAGNOSIS — E876 Hypokalemia: Secondary | ICD-10-CM | POA: Diagnosis not present

## 2018-06-15 DIAGNOSIS — I69959 Hemiplegia and hemiparesis following unspecified cerebrovascular disease affecting unspecified side: Secondary | ICD-10-CM | POA: Diagnosis not present

## 2018-06-15 DIAGNOSIS — W19XXXA Unspecified fall, initial encounter: Secondary | ICD-10-CM | POA: Diagnosis not present

## 2018-06-15 DIAGNOSIS — Z9181 History of falling: Secondary | ICD-10-CM | POA: Diagnosis not present

## 2018-06-15 DIAGNOSIS — E1151 Type 2 diabetes mellitus with diabetic peripheral angiopathy without gangrene: Secondary | ICD-10-CM | POA: Diagnosis not present

## 2018-06-15 DIAGNOSIS — G3183 Dementia with Lewy bodies: Secondary | ICD-10-CM | POA: Diagnosis not present

## 2018-06-15 DIAGNOSIS — N183 Chronic kidney disease, stage 3 (moderate): Secondary | ICD-10-CM | POA: Diagnosis not present

## 2018-06-15 DIAGNOSIS — I70209 Unspecified atherosclerosis of native arteries of extremities, unspecified extremity: Secondary | ICD-10-CM | POA: Diagnosis not present

## 2018-06-16 DIAGNOSIS — I69959 Hemiplegia and hemiparesis following unspecified cerebrovascular disease affecting unspecified side: Secondary | ICD-10-CM | POA: Diagnosis not present

## 2018-06-16 DIAGNOSIS — I129 Hypertensive chronic kidney disease with stage 1 through stage 4 chronic kidney disease, or unspecified chronic kidney disease: Secondary | ICD-10-CM | POA: Diagnosis not present

## 2018-06-16 DIAGNOSIS — E876 Hypokalemia: Secondary | ICD-10-CM | POA: Diagnosis not present

## 2018-06-16 DIAGNOSIS — I951 Orthostatic hypotension: Secondary | ICD-10-CM | POA: Diagnosis not present

## 2018-06-16 DIAGNOSIS — G3183 Dementia with Lewy bodies: Secondary | ICD-10-CM | POA: Diagnosis not present

## 2018-06-16 DIAGNOSIS — F0280 Dementia in other diseases classified elsewhere without behavioral disturbance: Secondary | ICD-10-CM | POA: Diagnosis not present

## 2018-06-18 DIAGNOSIS — I69959 Hemiplegia and hemiparesis following unspecified cerebrovascular disease affecting unspecified side: Secondary | ICD-10-CM | POA: Diagnosis not present

## 2018-06-18 DIAGNOSIS — I951 Orthostatic hypotension: Secondary | ICD-10-CM | POA: Diagnosis not present

## 2018-06-18 DIAGNOSIS — F0280 Dementia in other diseases classified elsewhere without behavioral disturbance: Secondary | ICD-10-CM | POA: Diagnosis not present

## 2018-06-18 DIAGNOSIS — I129 Hypertensive chronic kidney disease with stage 1 through stage 4 chronic kidney disease, or unspecified chronic kidney disease: Secondary | ICD-10-CM | POA: Diagnosis not present

## 2018-06-18 DIAGNOSIS — E876 Hypokalemia: Secondary | ICD-10-CM | POA: Diagnosis not present

## 2018-06-18 DIAGNOSIS — G3183 Dementia with Lewy bodies: Secondary | ICD-10-CM | POA: Diagnosis not present

## 2018-06-19 DIAGNOSIS — I69959 Hemiplegia and hemiparesis following unspecified cerebrovascular disease affecting unspecified side: Secondary | ICD-10-CM | POA: Diagnosis not present

## 2018-06-19 DIAGNOSIS — I951 Orthostatic hypotension: Secondary | ICD-10-CM | POA: Diagnosis not present

## 2018-06-19 DIAGNOSIS — G3183 Dementia with Lewy bodies: Secondary | ICD-10-CM | POA: Diagnosis not present

## 2018-06-19 DIAGNOSIS — E876 Hypokalemia: Secondary | ICD-10-CM | POA: Diagnosis not present

## 2018-06-19 DIAGNOSIS — F0280 Dementia in other diseases classified elsewhere without behavioral disturbance: Secondary | ICD-10-CM | POA: Diagnosis not present

## 2018-06-19 DIAGNOSIS — I129 Hypertensive chronic kidney disease with stage 1 through stage 4 chronic kidney disease, or unspecified chronic kidney disease: Secondary | ICD-10-CM | POA: Diagnosis not present

## 2018-06-24 DIAGNOSIS — I951 Orthostatic hypotension: Secondary | ICD-10-CM | POA: Diagnosis not present

## 2018-06-24 DIAGNOSIS — F0280 Dementia in other diseases classified elsewhere without behavioral disturbance: Secondary | ICD-10-CM | POA: Diagnosis not present

## 2018-06-24 DIAGNOSIS — I69959 Hemiplegia and hemiparesis following unspecified cerebrovascular disease affecting unspecified side: Secondary | ICD-10-CM | POA: Diagnosis not present

## 2018-06-24 DIAGNOSIS — I129 Hypertensive chronic kidney disease with stage 1 through stage 4 chronic kidney disease, or unspecified chronic kidney disease: Secondary | ICD-10-CM | POA: Diagnosis not present

## 2018-06-24 DIAGNOSIS — G3183 Dementia with Lewy bodies: Secondary | ICD-10-CM | POA: Diagnosis not present

## 2018-06-24 DIAGNOSIS — E876 Hypokalemia: Secondary | ICD-10-CM | POA: Diagnosis not present

## 2018-06-27 ENCOUNTER — Emergency Department (HOSPITAL_COMMUNITY)
Admission: EM | Admit: 2018-06-27 | Discharge: 2018-06-27 | Disposition: A | Discharge status: Home / Self Care | Payer: Medicare Other | Attending: Emergency Medicine | Admitting: Emergency Medicine

## 2018-06-27 ENCOUNTER — Emergency Department (HOSPITAL_COMMUNITY): Payer: Medicare Other

## 2018-06-27 ENCOUNTER — Encounter (HOSPITAL_COMMUNITY): Payer: Self-pay | Admitting: Emergency Medicine

## 2018-06-27 ENCOUNTER — Other Ambulatory Visit: Payer: Self-pay

## 2018-06-27 DIAGNOSIS — I129 Hypertensive chronic kidney disease with stage 1 through stage 4 chronic kidney disease, or unspecified chronic kidney disease: Secondary | ICD-10-CM | POA: Diagnosis not present

## 2018-06-27 DIAGNOSIS — I739 Peripheral vascular disease, unspecified: Secondary | ICD-10-CM | POA: Diagnosis not present

## 2018-06-27 DIAGNOSIS — R2243 Localized swelling, mass and lump, lower limb, bilateral: Secondary | ICD-10-CM | POA: Diagnosis not present

## 2018-06-27 DIAGNOSIS — F17210 Nicotine dependence, cigarettes, uncomplicated: Secondary | ICD-10-CM | POA: Diagnosis not present

## 2018-06-27 DIAGNOSIS — M7989 Other specified soft tissue disorders: Secondary | ICD-10-CM

## 2018-06-27 DIAGNOSIS — E1122 Type 2 diabetes mellitus with diabetic chronic kidney disease: Secondary | ICD-10-CM | POA: Diagnosis not present

## 2018-06-27 DIAGNOSIS — Z79899 Other long term (current) drug therapy: Secondary | ICD-10-CM | POA: Insufficient documentation

## 2018-06-27 DIAGNOSIS — Z7982 Long term (current) use of aspirin: Secondary | ICD-10-CM | POA: Insufficient documentation

## 2018-06-27 DIAGNOSIS — I7389 Other specified peripheral vascular diseases: Secondary | ICD-10-CM | POA: Diagnosis not present

## 2018-06-27 DIAGNOSIS — Z794 Long term (current) use of insulin: Secondary | ICD-10-CM | POA: Insufficient documentation

## 2018-06-27 DIAGNOSIS — S91301A Unspecified open wound, right foot, initial encounter: Secondary | ICD-10-CM | POA: Diagnosis not present

## 2018-06-27 DIAGNOSIS — M79671 Pain in right foot: Secondary | ICD-10-CM | POA: Insufficient documentation

## 2018-06-27 DIAGNOSIS — E1143 Type 2 diabetes mellitus with diabetic autonomic (poly)neuropathy: Secondary | ICD-10-CM | POA: Diagnosis not present

## 2018-06-27 DIAGNOSIS — R600 Localized edema: Secondary | ICD-10-CM | POA: Diagnosis not present

## 2018-06-27 DIAGNOSIS — Z7901 Long term (current) use of anticoagulants: Secondary | ICD-10-CM | POA: Diagnosis not present

## 2018-06-27 DIAGNOSIS — N183 Chronic kidney disease, stage 3 (moderate): Secondary | ICD-10-CM | POA: Insufficient documentation

## 2018-06-27 DIAGNOSIS — S91302A Unspecified open wound, left foot, initial encounter: Secondary | ICD-10-CM | POA: Diagnosis not present

## 2018-06-27 DIAGNOSIS — M79672 Pain in left foot: Secondary | ICD-10-CM | POA: Diagnosis present

## 2018-06-27 NOTE — ED Notes (Signed)
Patient's daughter called to notify of discharge. She verbalized understanding of follow up appointment

## 2018-06-27 NOTE — Discharge Instructions (Addendum)
The x-rays show no signs of broken bones, no signs of significant trauma or injury.  The swelling appears to be bruising.  Please keep your feet elevated, intermittent ice packs wrapped in towel, Tylenol or ibuprofen for discomfort.  See your doctor in 3 days for recheck or return to the emergency department for severe or worsening symptoms.  Your pulses are normal today - I spoke with the vascular surgeon who agrees that you can be seen by Dr. Gwenlyn Found in next 1--2 weeks.  ER for increased pain
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
## 2018-06-27 NOTE — ED Triage Notes (Signed)
Patient arrived from home with reports sores on bilateral feet. Denies pain, states that his son in law found them.

## 2018-06-27 NOTE — ED Notes (Signed)
Patient transported to X-ray 

## 2018-06-27 NOTE — ED Notes (Signed)
Pt back from x-ray.

## 2018-06-30 ENCOUNTER — Other Ambulatory Visit: Payer: Self-pay

## 2018-06-30 ENCOUNTER — Emergency Department (HOSPITAL_COMMUNITY)
Admission: EM | Admit: 2018-06-30 | Discharge: 2018-06-30 | Disposition: A | Discharge status: Home / Self Care | Payer: Medicare Other | Attending: Emergency Medicine | Admitting: Emergency Medicine

## 2018-06-30 ENCOUNTER — Encounter (HOSPITAL_COMMUNITY): Payer: Self-pay

## 2018-06-30 DIAGNOSIS — I129 Hypertensive chronic kidney disease with stage 1 through stage 4 chronic kidney disease, or unspecified chronic kidney disease: Secondary | ICD-10-CM | POA: Diagnosis not present

## 2018-06-30 DIAGNOSIS — Z8673 Personal history of transient ischemic attack (TIA), and cerebral infarction without residual deficits: Secondary | ICD-10-CM | POA: Diagnosis not present

## 2018-06-30 DIAGNOSIS — Y999 Unspecified external cause status: Secondary | ICD-10-CM | POA: Insufficient documentation

## 2018-06-30 DIAGNOSIS — T8130XA Disruption of wound, unspecified, initial encounter: Secondary | ICD-10-CM | POA: Diagnosis not present

## 2018-06-30 DIAGNOSIS — E1122 Type 2 diabetes mellitus with diabetic chronic kidney disease: Secondary | ICD-10-CM | POA: Insufficient documentation

## 2018-06-30 DIAGNOSIS — Y939 Activity, unspecified: Secondary | ICD-10-CM | POA: Insufficient documentation

## 2018-06-30 DIAGNOSIS — Y69 Unspecified misadventure during surgical and medical care: Secondary | ICD-10-CM | POA: Diagnosis not present

## 2018-06-30 DIAGNOSIS — N183 Chronic kidney disease, stage 3 (moderate): Secondary | ICD-10-CM | POA: Diagnosis not present

## 2018-06-30 DIAGNOSIS — S90822A Blister (nonthermal), left foot, initial encounter: Secondary | ICD-10-CM | POA: Diagnosis present

## 2018-06-30 DIAGNOSIS — F17210 Nicotine dependence, cigarettes, uncomplicated: Secondary | ICD-10-CM | POA: Diagnosis not present

## 2018-06-30 DIAGNOSIS — Y929 Unspecified place or not applicable: Secondary | ICD-10-CM | POA: Insufficient documentation

## 2018-06-30 NOTE — Discharge Instructions (Addendum)
You were evaluated in the Emergency Department and after careful evaluation, we did not find any emergent condition requiring admission or further testing in the hospital.  Your symptoms today seem to be due to blistering of the skin from recent trauma.  The blister has opened and the skin was removed, revealing healthy appearing underlying skin and soft tissue.  You are at increased risk of this wound becoming infected.  Please dress the wound daily as we discussed and keep the wound clean.  It is important that you see a doctor within the next week for a recheck.  Please return to the Emergency Department if you experience any worsening of your condition.  We encourage you to follow up with a primary care provider.  Thank you for allowing [place] to be a part of your care.

## 2018-06-30 NOTE — ED Notes (Signed)
Harry [name] (daughter) number on the back of pts labels for updates, please call when updates available

## 2018-06-30 NOTE — ED Notes (Signed)
Patient verbalizes understanding of discharge instructions . Opportunity for questions and answers were provided . Armband removed by staff ,Pt discharged from ED. W/C  offered at D/C  and Declined W/C at D/C and was escorted to lobby by RN.  

## 2018-06-30 NOTE — ED Triage Notes (Signed)
Pt reports sore to left middle toe. Reports it has been bleeding consistently. Toe appears to have a degloving injury. Pt denies any injury to his toe but states he pulled the skin off.
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
## 2018-07-01 DIAGNOSIS — E1143 Type 2 diabetes mellitus with diabetic autonomic (poly)neuropathy: Secondary | ICD-10-CM | POA: Diagnosis not present

## 2018-07-01 DIAGNOSIS — I739 Peripheral vascular disease, unspecified: Secondary | ICD-10-CM | POA: Diagnosis not present

## 2018-07-01 DIAGNOSIS — L97501 Non-pressure chronic ulcer of other part of unspecified foot limited to breakdown of skin: Secondary | ICD-10-CM | POA: Diagnosis not present

## 2018-07-03 DIAGNOSIS — F17210 Nicotine dependence, cigarettes, uncomplicated: Secondary | ICD-10-CM | POA: Diagnosis not present

## 2018-07-03 DIAGNOSIS — I951 Orthostatic hypotension: Secondary | ICD-10-CM | POA: Diagnosis not present

## 2018-07-03 DIAGNOSIS — Z7902 Long term (current) use of antithrombotics/antiplatelets: Secondary | ICD-10-CM | POA: Diagnosis not present

## 2018-07-03 DIAGNOSIS — E785 Hyperlipidemia, unspecified: Secondary | ICD-10-CM | POA: Diagnosis not present

## 2018-07-03 DIAGNOSIS — Z794 Long term (current) use of insulin: Secondary | ICD-10-CM | POA: Diagnosis not present

## 2018-07-03 DIAGNOSIS — Z7982 Long term (current) use of aspirin: Secondary | ICD-10-CM | POA: Diagnosis not present

## 2018-07-03 DIAGNOSIS — E1151 Type 2 diabetes mellitus with diabetic peripheral angiopathy without gangrene: Secondary | ICD-10-CM | POA: Diagnosis not present

## 2018-07-03 DIAGNOSIS — E1143 Type 2 diabetes mellitus with diabetic autonomic (poly)neuropathy: Secondary | ICD-10-CM | POA: Diagnosis not present

## 2018-07-03 DIAGNOSIS — L97511 Non-pressure chronic ulcer of other part of right foot limited to breakdown of skin: Secondary | ICD-10-CM | POA: Diagnosis not present

## 2018-07-03 DIAGNOSIS — E113293 Type 2 diabetes mellitus with mild nonproliferative diabetic retinopathy without macular edema, bilateral: Secondary | ICD-10-CM | POA: Diagnosis not present

## 2018-07-03 DIAGNOSIS — F0390 Unspecified dementia without behavioral disturbance: Secondary | ICD-10-CM | POA: Diagnosis not present

## 2018-07-07 ENCOUNTER — Other Ambulatory Visit: Payer: Self-pay | Admitting: Cardiovascular Disease

## 2018-07-07 DIAGNOSIS — I739 Peripheral vascular disease, unspecified: Secondary | ICD-10-CM

## 2018-07-10 DIAGNOSIS — F0390 Unspecified dementia without behavioral disturbance: Secondary | ICD-10-CM | POA: Diagnosis not present

## 2018-07-10 DIAGNOSIS — E113293 Type 2 diabetes mellitus with mild nonproliferative diabetic retinopathy without macular edema, bilateral: Secondary | ICD-10-CM | POA: Diagnosis not present

## 2018-07-10 DIAGNOSIS — E1143 Type 2 diabetes mellitus with diabetic autonomic (poly)neuropathy: Secondary | ICD-10-CM | POA: Diagnosis not present

## 2018-07-10 DIAGNOSIS — E785 Hyperlipidemia, unspecified: Secondary | ICD-10-CM | POA: Diagnosis not present

## 2018-07-10 DIAGNOSIS — E1151 Type 2 diabetes mellitus with diabetic peripheral angiopathy without gangrene: Secondary | ICD-10-CM | POA: Diagnosis not present

## 2018-07-10 DIAGNOSIS — L97511 Non-pressure chronic ulcer of other part of right foot limited to breakdown of skin: Secondary | ICD-10-CM | POA: Diagnosis not present

## 2018-07-15 DIAGNOSIS — E1151 Type 2 diabetes mellitus with diabetic peripheral angiopathy without gangrene: Secondary | ICD-10-CM | POA: Diagnosis not present

## 2018-07-15 DIAGNOSIS — E113293 Type 2 diabetes mellitus with mild nonproliferative diabetic retinopathy without macular edema, bilateral: Secondary | ICD-10-CM | POA: Diagnosis not present

## 2018-07-15 DIAGNOSIS — L97511 Non-pressure chronic ulcer of other part of right foot limited to breakdown of skin: Secondary | ICD-10-CM | POA: Diagnosis not present

## 2018-07-15 DIAGNOSIS — E1143 Type 2 diabetes mellitus with diabetic autonomic (poly)neuropathy: Secondary | ICD-10-CM | POA: Diagnosis not present

## 2018-07-15 DIAGNOSIS — F0390 Unspecified dementia without behavioral disturbance: Secondary | ICD-10-CM | POA: Diagnosis not present

## 2018-07-15 DIAGNOSIS — E785 Hyperlipidemia, unspecified: Secondary | ICD-10-CM | POA: Diagnosis not present

## 2018-07-18 DIAGNOSIS — E785 Hyperlipidemia, unspecified: Secondary | ICD-10-CM | POA: Diagnosis not present

## 2018-07-18 DIAGNOSIS — E113293 Type 2 diabetes mellitus with mild nonproliferative diabetic retinopathy without macular edema, bilateral: Secondary | ICD-10-CM | POA: Diagnosis not present

## 2018-07-18 DIAGNOSIS — F0390 Unspecified dementia without behavioral disturbance: Secondary | ICD-10-CM | POA: Diagnosis not present

## 2018-07-18 DIAGNOSIS — L97511 Non-pressure chronic ulcer of other part of right foot limited to breakdown of skin: Secondary | ICD-10-CM | POA: Diagnosis not present

## 2018-07-18 DIAGNOSIS — E1151 Type 2 diabetes mellitus with diabetic peripheral angiopathy without gangrene: Secondary | ICD-10-CM | POA: Diagnosis not present

## 2018-07-18 DIAGNOSIS — E1143 Type 2 diabetes mellitus with diabetic autonomic (poly)neuropathy: Secondary | ICD-10-CM | POA: Diagnosis not present

## 2018-07-18 DIAGNOSIS — R5383 Other fatigue: Secondary | ICD-10-CM | POA: Diagnosis not present

## 2018-07-18 DIAGNOSIS — I10 Essential (primary) hypertension: Secondary | ICD-10-CM | POA: Diagnosis not present

## 2018-07-19 DIAGNOSIS — E1143 Type 2 diabetes mellitus with diabetic autonomic (poly)neuropathy: Secondary | ICD-10-CM | POA: Diagnosis not present

## 2018-07-22 DIAGNOSIS — L97511 Non-pressure chronic ulcer of other part of right foot limited to breakdown of skin: Secondary | ICD-10-CM | POA: Diagnosis not present

## 2018-07-22 DIAGNOSIS — F0390 Unspecified dementia without behavioral disturbance: Secondary | ICD-10-CM | POA: Diagnosis not present

## 2018-07-22 DIAGNOSIS — E785 Hyperlipidemia, unspecified: Secondary | ICD-10-CM | POA: Diagnosis not present

## 2018-07-22 DIAGNOSIS — E113293 Type 2 diabetes mellitus with mild nonproliferative diabetic retinopathy without macular edema, bilateral: Secondary | ICD-10-CM | POA: Diagnosis not present

## 2018-07-22 DIAGNOSIS — E1151 Type 2 diabetes mellitus with diabetic peripheral angiopathy without gangrene: Secondary | ICD-10-CM | POA: Diagnosis not present

## 2018-07-22 DIAGNOSIS — E1143 Type 2 diabetes mellitus with diabetic autonomic (poly)neuropathy: Secondary | ICD-10-CM | POA: Diagnosis not present

## 2018-07-24 DIAGNOSIS — E1143 Type 2 diabetes mellitus with diabetic autonomic (poly)neuropathy: Secondary | ICD-10-CM | POA: Diagnosis not present

## 2018-07-24 DIAGNOSIS — L97511 Non-pressure chronic ulcer of other part of right foot limited to breakdown of skin: Secondary | ICD-10-CM | POA: Diagnosis not present

## 2018-07-24 DIAGNOSIS — E1151 Type 2 diabetes mellitus with diabetic peripheral angiopathy without gangrene: Secondary | ICD-10-CM | POA: Diagnosis not present

## 2018-07-24 DIAGNOSIS — F0390 Unspecified dementia without behavioral disturbance: Secondary | ICD-10-CM | POA: Diagnosis not present

## 2018-07-24 DIAGNOSIS — E113293 Type 2 diabetes mellitus with mild nonproliferative diabetic retinopathy without macular edema, bilateral: Secondary | ICD-10-CM | POA: Diagnosis not present

## 2018-07-24 DIAGNOSIS — E785 Hyperlipidemia, unspecified: Secondary | ICD-10-CM | POA: Diagnosis not present

## 2018-07-26 ENCOUNTER — Other Ambulatory Visit: Payer: Self-pay | Admitting: Cardiovascular Disease

## 2018-07-29 DIAGNOSIS — E785 Hyperlipidemia, unspecified: Secondary | ICD-10-CM | POA: Diagnosis not present

## 2018-07-29 DIAGNOSIS — E1151 Type 2 diabetes mellitus with diabetic peripheral angiopathy without gangrene: Secondary | ICD-10-CM | POA: Diagnosis not present

## 2018-07-29 DIAGNOSIS — E113293 Type 2 diabetes mellitus with mild nonproliferative diabetic retinopathy without macular edema, bilateral: Secondary | ICD-10-CM | POA: Diagnosis not present

## 2018-07-29 DIAGNOSIS — L97511 Non-pressure chronic ulcer of other part of right foot limited to breakdown of skin: Secondary | ICD-10-CM | POA: Diagnosis not present

## 2018-07-29 DIAGNOSIS — F0390 Unspecified dementia without behavioral disturbance: Secondary | ICD-10-CM | POA: Diagnosis not present

## 2018-07-29 DIAGNOSIS — E1143 Type 2 diabetes mellitus with diabetic autonomic (poly)neuropathy: Secondary | ICD-10-CM | POA: Diagnosis not present

## 2018-07-31 ENCOUNTER — Telehealth: Payer: Self-pay | Admitting: Cardiovascular Disease

## 2018-07-31 DIAGNOSIS — Z9862 Peripheral vascular angioplasty status: Secondary | ICD-10-CM

## 2018-07-31 NOTE — Telephone Encounter (Signed)
° ° °  Patient's daughter calling about non healing wounds on toes/ feet.

## 2018-08-01 DIAGNOSIS — E113293 Type 2 diabetes mellitus with mild nonproliferative diabetic retinopathy without macular edema, bilateral: Secondary | ICD-10-CM | POA: Diagnosis not present

## 2018-08-01 DIAGNOSIS — E1143 Type 2 diabetes mellitus with diabetic autonomic (poly)neuropathy: Secondary | ICD-10-CM | POA: Diagnosis not present

## 2018-08-01 DIAGNOSIS — E1151 Type 2 diabetes mellitus with diabetic peripheral angiopathy without gangrene: Secondary | ICD-10-CM | POA: Diagnosis not present

## 2018-08-01 DIAGNOSIS — F0390 Unspecified dementia without behavioral disturbance: Secondary | ICD-10-CM | POA: Diagnosis not present

## 2018-08-01 DIAGNOSIS — E785 Hyperlipidemia, unspecified: Secondary | ICD-10-CM | POA: Diagnosis not present

## 2018-08-01 DIAGNOSIS — L97511 Non-pressure chronic ulcer of other part of right foot limited to breakdown of skin: Secondary | ICD-10-CM | POA: Diagnosis not present

## 2018-08-01 NOTE — Telephone Encounter (Signed)
Follow up:   Patient daughter calling back to speak with a nurse concering some wounds that is not healing on his toes.

## 2018-08-01 NOTE — Telephone Encounter (Signed)
Spoke with pt's daughter. She report pt has a wound on his left toe since 4/24. He was seen in the ER that day and went back on 4/27. She report wound is not healing properly and has turned black. Daughter state she will upload a picture to mychart for Dr. Gwenlyn Found to review.  Message Routed to MD

## 2018-08-02 DIAGNOSIS — E1143 Type 2 diabetes mellitus with diabetic autonomic (poly)neuropathy: Secondary | ICD-10-CM | POA: Diagnosis not present

## 2018-08-02 DIAGNOSIS — E1151 Type 2 diabetes mellitus with diabetic peripheral angiopathy without gangrene: Secondary | ICD-10-CM | POA: Diagnosis not present

## 2018-08-02 DIAGNOSIS — Z794 Long term (current) use of insulin: Secondary | ICD-10-CM | POA: Diagnosis not present

## 2018-08-02 DIAGNOSIS — F17210 Nicotine dependence, cigarettes, uncomplicated: Secondary | ICD-10-CM | POA: Diagnosis not present

## 2018-08-02 DIAGNOSIS — L97511 Non-pressure chronic ulcer of other part of right foot limited to breakdown of skin: Secondary | ICD-10-CM | POA: Diagnosis not present

## 2018-08-02 DIAGNOSIS — Z7982 Long term (current) use of aspirin: Secondary | ICD-10-CM | POA: Diagnosis not present

## 2018-08-02 DIAGNOSIS — F0390 Unspecified dementia without behavioral disturbance: Secondary | ICD-10-CM | POA: Diagnosis not present

## 2018-08-02 DIAGNOSIS — E113293 Type 2 diabetes mellitus with mild nonproliferative diabetic retinopathy without macular edema, bilateral: Secondary | ICD-10-CM | POA: Diagnosis not present

## 2018-08-02 DIAGNOSIS — I951 Orthostatic hypotension: Secondary | ICD-10-CM | POA: Diagnosis not present

## 2018-08-02 DIAGNOSIS — Z7902 Long term (current) use of antithrombotics/antiplatelets: Secondary | ICD-10-CM | POA: Diagnosis not present

## 2018-08-02 DIAGNOSIS — E785 Hyperlipidemia, unspecified: Secondary | ICD-10-CM | POA: Diagnosis not present

## 2018-08-02 NOTE — Telephone Encounter (Signed)
Pt needs LEAs next week then I need to see him in the office after that on my DOD day

## 2018-08-04 NOTE — Telephone Encounter (Signed)
Pts daughter agrees to LEA this week and will bring pt for OV with Dr. Gwenlyn Found 08/08/18 at 3pm and agree to COVID19 precautions and will wear a mask.       COVID-19 Pre-Screening Questions:  . In the past 7 to 10 days have you had a cough,  shortness of breath, headache, congestion, fever (100 or greater) body aches, chills, sore throat, or sudden loss of taste or sense of smell?NO .[name] Kitchen. pt has h/o smokers cough but no change to show acute problem  . Have you been around anyone with known Covid 19. NO  . Have you been around anyone who is awaiting Covid 19 test results in the past 7 to 10 days?NO . Have you been around anyone who has been exposed to Covid 19, or has mentioned symptoms of Covid 19 within the past 7 to 10 days?NO  If you have any concerns/questions about symptoms patients report during screening (either on the phone or at threshold). Contact the provider seeing the patient or DOD for further guidance.  If neither are available contact a member of the leadership team.

## 2018-08-05 DIAGNOSIS — L97511 Non-pressure chronic ulcer of other part of right foot limited to breakdown of skin: Secondary | ICD-10-CM | POA: Diagnosis not present

## 2018-08-05 DIAGNOSIS — E113293 Type 2 diabetes mellitus with mild nonproliferative diabetic retinopathy without macular edema, bilateral: Secondary | ICD-10-CM | POA: Diagnosis not present

## 2018-08-05 DIAGNOSIS — F0390 Unspecified dementia without behavioral disturbance: Secondary | ICD-10-CM | POA: Diagnosis not present

## 2018-08-05 DIAGNOSIS — E1151 Type 2 diabetes mellitus with diabetic peripheral angiopathy without gangrene: Secondary | ICD-10-CM | POA: Diagnosis not present

## 2018-08-05 DIAGNOSIS — E1143 Type 2 diabetes mellitus with diabetic autonomic (poly)neuropathy: Secondary | ICD-10-CM | POA: Diagnosis not present

## 2018-08-05 DIAGNOSIS — E785 Hyperlipidemia, unspecified: Secondary | ICD-10-CM | POA: Diagnosis not present

## 2018-08-06 ENCOUNTER — Telehealth: Payer: Self-pay

## 2018-08-06 ENCOUNTER — Other Ambulatory Visit: Payer: Self-pay | Admitting: Cardiovascular Disease

## 2018-08-06 DIAGNOSIS — Z9862 Peripheral vascular angioplasty status: Secondary | ICD-10-CM

## 2018-08-06 DIAGNOSIS — I739 Peripheral vascular disease, unspecified: Secondary | ICD-10-CM

## 2018-08-06 NOTE — Telephone Encounter (Signed)
Please review patient message from 08/01/2018. The patient has attached images of his foot/feet for you.

## 2018-08-07 ENCOUNTER — Other Ambulatory Visit: Payer: Self-pay | Admitting: Cardiovascular Disease

## 2018-08-07 ENCOUNTER — Other Ambulatory Visit: Payer: Self-pay

## 2018-08-07 ENCOUNTER — Ambulatory Visit (HOSPITAL_COMMUNITY)
Admission: RE | Admit: 2018-08-07 | Discharge: 2018-08-07 | Disposition: A | Discharge status: Home / Self Care | Payer: Medicare Other | Source: Ambulatory Visit | Attending: Cardiology | Admitting: Cardiology

## 2018-08-07 DIAGNOSIS — Z9862 Peripheral vascular angioplasty status: Secondary | ICD-10-CM | POA: Diagnosis not present

## 2018-08-07 DIAGNOSIS — I739 Peripheral vascular disease, unspecified: Secondary | ICD-10-CM

## 2018-08-07 NOTE — Telephone Encounter (Signed)
Let's look at it tomorrow. Please remind me

## 2018-08-08 ENCOUNTER — Ambulatory Visit (INDEPENDENT_AMBULATORY_CARE_PROVIDER_SITE_OTHER): Payer: Medicare Other | Admitting: Cardiovascular Disease

## 2018-08-08 ENCOUNTER — Encounter: Payer: Self-pay | Admitting: Cardiovascular Disease

## 2018-08-08 ENCOUNTER — Other Ambulatory Visit: Payer: Self-pay

## 2018-08-08 DIAGNOSIS — E785 Hyperlipidemia, unspecified: Secondary | ICD-10-CM | POA: Diagnosis not present

## 2018-08-08 DIAGNOSIS — F17200 Nicotine dependence, unspecified, uncomplicated: Secondary | ICD-10-CM | POA: Diagnosis not present

## 2018-08-08 DIAGNOSIS — I739 Peripheral vascular disease, unspecified: Secondary | ICD-10-CM | POA: Diagnosis not present

## 2018-08-08 DIAGNOSIS — I10 Essential (primary) hypertension: Secondary | ICD-10-CM | POA: Diagnosis not present

## 2018-08-08 NOTE — Assessment & Plan Note (Signed)
History of peripheral arterial disease status post staged right and left SFA intervention in June and July 2016.  Dopplers performed 2 years ago revealed an occluded right SFA and Dopplers recently performed on 08/08/2018 revealed a left ABI 0.84.  His SFA is widely patent and his anterior tibial artery was occluded.  He has a somewhat darkened left second toe without open wound.  This is been on for the last month.  He does have diabetic peripheral neuropathy and is unaware of this.  At this point, there is no open wound.  He is got 2 out of 3 patent tibial vessels and a patent left left SFA.  We will continue to follow this conservatively.

## 2018-08-08 NOTE — Assessment & Plan Note (Signed)
History of essential hypertension with blood pressure measured today 132/74.  He is on no antihypertensive medications.
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
## 2018-08-08 NOTE — Assessment & Plan Note (Signed)
History of hyperlipidemia on statin therapy followed by his PCP 

## 2018-08-08 NOTE — Patient Instructions (Signed)
Medication Instructions:  Your physician recommends that you continue on your current medications as directed. Please refer to the Current Medication list given to you today.  If you need a refill on your cardiac medications before your next appointment, please call your pharmacy.   Lab work: none If you have labs (blood work) drawn today and your tests are completely normal, you will receive your results only by: [name] Kitchen MyChart Message (if you have MyChart) OR . A paper copy in the mail If you have any lab test that is abnormal or we need to change your treatment, we will call you to review the results.  Testing/Procedures: none  Follow-Up: At [facility], you and your health needs are our priority.  As part of our continuing mission to provide you with exceptional heart care, we have created designated Provider Care Teams.  These Care Teams include your primary Cardiologist (physician) and Advanced Practice Providers (APPs -  Physician Assistants and Nurse Practitioners) who all work together to provide you with the care you need, when you need it. You will need a follow up appointment in 3 months with Dr. Gwenlyn Found.

## 2018-08-08 NOTE — Assessment & Plan Note (Signed)
History of ongoing tobacco abuse of one third of a pack a day recalcitrant to risk factor modification. 

## 2018-08-11 ENCOUNTER — Inpatient Hospital Stay (HOSPITAL_COMMUNITY)
Admission: EM | Admit: 2018-08-11 | Discharge: 2018-08-19 | DRG: 065 | Disposition: A | Discharge status: Skilled Nursing Facility | Payer: Medicare Other | Attending: Internal Medicine | Admitting: Internal Medicine

## 2018-08-11 ENCOUNTER — Encounter (HOSPITAL_COMMUNITY): Payer: Self-pay | Admitting: Emergency Medicine

## 2018-08-11 ENCOUNTER — Other Ambulatory Visit: Payer: Self-pay

## 2018-08-11 DIAGNOSIS — N183 Chronic kidney disease, stage 3 (moderate): Secondary | ICD-10-CM | POA: Diagnosis present

## 2018-08-11 DIAGNOSIS — I639 Cerebral infarction, unspecified: Secondary | ICD-10-CM | POA: Diagnosis present

## 2018-08-11 DIAGNOSIS — Z9842 Cataract extraction status, left eye: Secondary | ICD-10-CM

## 2018-08-11 DIAGNOSIS — F17200 Nicotine dependence, unspecified, uncomplicated: Secondary | ICD-10-CM | POA: Diagnosis present

## 2018-08-11 DIAGNOSIS — I160 Hypertensive urgency: Secondary | ICD-10-CM | POA: Diagnosis present

## 2018-08-11 DIAGNOSIS — E785 Hyperlipidemia, unspecified: Secondary | ICD-10-CM | POA: Diagnosis present

## 2018-08-11 DIAGNOSIS — B952 Enterococcus as the cause of diseases classified elsewhere: Secondary | ICD-10-CM | POA: Diagnosis present

## 2018-08-11 DIAGNOSIS — I951 Orthostatic hypotension: Secondary | ICD-10-CM | POA: Diagnosis present

## 2018-08-11 DIAGNOSIS — I69398 Other sequelae of cerebral infarction: Secondary | ICD-10-CM

## 2018-08-11 DIAGNOSIS — N189 Chronic kidney disease, unspecified: Secondary | ICD-10-CM | POA: Diagnosis present

## 2018-08-11 DIAGNOSIS — Z7902 Long term (current) use of antithrombotics/antiplatelets: Secondary | ICD-10-CM

## 2018-08-11 DIAGNOSIS — H5462 Unqualified visual loss, left eye, normal vision right eye: Secondary | ICD-10-CM | POA: Diagnosis present

## 2018-08-11 DIAGNOSIS — Z955 Presence of coronary angioplasty implant and graft: Secondary | ICD-10-CM

## 2018-08-11 DIAGNOSIS — F17210 Nicotine dependence, cigarettes, uncomplicated: Secondary | ICD-10-CM | POA: Diagnosis present

## 2018-08-11 DIAGNOSIS — Z8249 Family history of ischemic heart disease and other diseases of the circulatory system: Secondary | ICD-10-CM

## 2018-08-11 DIAGNOSIS — Z1159 Encounter for screening for other viral diseases: Secondary | ICD-10-CM | POA: Diagnosis not present

## 2018-08-11 DIAGNOSIS — N390 Urinary tract infection, site not specified: Secondary | ICD-10-CM | POA: Diagnosis present

## 2018-08-11 DIAGNOSIS — H538 Other visual disturbances: Secondary | ICD-10-CM | POA: Diagnosis present

## 2018-08-11 DIAGNOSIS — R42 Dizziness and giddiness: Secondary | ICD-10-CM | POA: Diagnosis not present

## 2018-08-11 DIAGNOSIS — E876 Hypokalemia: Secondary | ICD-10-CM | POA: Diagnosis not present

## 2018-08-11 DIAGNOSIS — N179 Acute kidney failure, unspecified: Secondary | ICD-10-CM | POA: Diagnosis not present

## 2018-08-11 DIAGNOSIS — F0150 Vascular dementia without behavioral disturbance: Secondary | ICD-10-CM | POA: Diagnosis present

## 2018-08-11 DIAGNOSIS — N1831 Chronic kidney disease, stage 3a: Secondary | ICD-10-CM | POA: Diagnosis present

## 2018-08-11 DIAGNOSIS — I6523 Occlusion and stenosis of bilateral carotid arteries: Secondary | ICD-10-CM | POA: Diagnosis present

## 2018-08-11 DIAGNOSIS — R29702 NIHSS score 2: Secondary | ICD-10-CM | POA: Diagnosis not present

## 2018-08-11 DIAGNOSIS — R270 Ataxia, unspecified: Secondary | ICD-10-CM | POA: Diagnosis present

## 2018-08-11 DIAGNOSIS — I6389 Other cerebral infarction: Principal | ICD-10-CM | POA: Diagnosis present

## 2018-08-11 DIAGNOSIS — G901 Familial dysautonomia [Riley-Day]: Secondary | ICD-10-CM | POA: Diagnosis present

## 2018-08-11 DIAGNOSIS — E1122 Type 2 diabetes mellitus with diabetic chronic kidney disease: Secondary | ICD-10-CM | POA: Diagnosis present

## 2018-08-11 DIAGNOSIS — R079 Chest pain, unspecified: Secondary | ICD-10-CM | POA: Diagnosis not present

## 2018-08-11 DIAGNOSIS — Z79899 Other long term (current) drug therapy: Secondary | ICD-10-CM

## 2018-08-11 DIAGNOSIS — I10 Essential (primary) hypertension: Secondary | ICD-10-CM | POA: Diagnosis present

## 2018-08-11 DIAGNOSIS — Z9841 Cataract extraction status, right eye: Secondary | ICD-10-CM

## 2018-08-11 DIAGNOSIS — E1169 Type 2 diabetes mellitus with other specified complication: Secondary | ICD-10-CM

## 2018-08-11 DIAGNOSIS — Z7982 Long term (current) use of aspirin: Secondary | ICD-10-CM

## 2018-08-11 DIAGNOSIS — E1151 Type 2 diabetes mellitus with diabetic peripheral angiopathy without gangrene: Secondary | ICD-10-CM | POA: Diagnosis present

## 2018-08-11 DIAGNOSIS — Z794 Long term (current) use of insulin: Secondary | ICD-10-CM

## 2018-08-11 DIAGNOSIS — E1165 Type 2 diabetes mellitus with hyperglycemia: Secondary | ICD-10-CM | POA: Diagnosis not present

## 2018-08-11 DIAGNOSIS — D509 Iron deficiency anemia, unspecified: Secondary | ICD-10-CM | POA: Diagnosis present

## 2018-08-11 DIAGNOSIS — Z833 Family history of diabetes mellitus: Secondary | ICD-10-CM

## 2018-08-11 DIAGNOSIS — I2510 Atherosclerotic heart disease of native coronary artery without angina pectoris: Secondary | ICD-10-CM | POA: Diagnosis present

## 2018-08-11 DIAGNOSIS — I129 Hypertensive chronic kidney disease with stage 1 through stage 4 chronic kidney disease, or unspecified chronic kidney disease: Secondary | ICD-10-CM | POA: Diagnosis present

## 2018-08-11 DIAGNOSIS — F0390 Unspecified dementia without behavioral disturbance: Secondary | ICD-10-CM

## 2018-08-11 DIAGNOSIS — E11649 Type 2 diabetes mellitus with hypoglycemia without coma: Secondary | ICD-10-CM | POA: Diagnosis present

## 2018-08-11 DIAGNOSIS — Z888 Allergy status to other drugs, medicaments and biological substances status: Secondary | ICD-10-CM

## 2018-08-11 LAB — CBC WITH DIFFERENTIAL/PLATELET
Abs Immature Granulocytes: 0.01 10*3/uL (ref 0.00–0.07)
Basophils Absolute: 0 10*3/uL (ref 0.0–0.1)
Basophils Relative: 1 %
Eosinophils Absolute: 0.1 10*3/uL (ref 0.0–0.5)
Eosinophils Relative: 2 %
HCT: 26.7 % — ABNORMAL LOW (ref 39.0–52.0)
Hemoglobin: 8.4 g/dL — ABNORMAL LOW (ref 13.0–17.0)
Immature Granulocytes: 0 %
Lymphocytes Relative: 33 %
Lymphs Abs: 1.6 10*3/uL (ref 0.7–4.0)
MCH: 27.2 pg (ref 26.0–34.0)
MCHC: 31.5 g/dL (ref 30.0–36.0)
MCV: 86.4 fL (ref 80.0–100.0)
Monocytes Absolute: 0.5 10*3/uL (ref 0.1–1.0)
Monocytes Relative: 10 %
Neutro Abs: 2.7 10*3/uL (ref 1.7–7.7)
Neutrophils Relative %: 54 %
Platelets: 268 10*3/uL (ref 150–400)
RBC: 3.09 MIL/uL — ABNORMAL LOW (ref 4.22–5.81)
RDW: 15.1 % (ref 11.5–15.5)
WBC: 5 10*3/uL (ref 4.0–10.5)
nRBC: 0 % (ref 0.0–0.2)

## 2018-08-11 LAB — COMPREHENSIVE METABOLIC PANEL
ALT: 13 U/L (ref 0–44)
AST: 16 U/L (ref 15–41)
Albumin: 3.4 g/dL — ABNORMAL LOW (ref 3.5–5.0)
Alkaline Phosphatase: 69 U/L (ref 38–126)
Anion gap: 10 (ref 5–15)
BUN: 27 mg/dL — ABNORMAL HIGH (ref 8–23)
CO2: 27 mmol/L (ref 22–32)
Calcium: 8.2 mg/dL — ABNORMAL LOW (ref 8.9–10.3)
Chloride: 103 mmol/L (ref 98–111)
Creatinine, Ser: 2.37 mg/dL — ABNORMAL HIGH (ref 0.61–1.24)
GFR calc Af Amer: 31 mL/min — ABNORMAL LOW (ref >60)
GFR calc non Af Amer: 27 mL/min — ABNORMAL LOW (ref >60)
Glucose, Bld: 184 mg/dL — ABNORMAL HIGH (ref 70–99)
Potassium: 3 mmol/L — ABNORMAL LOW (ref 3.5–5.1)
Sodium: 140 mmol/L (ref 135–145)
Total Bilirubin: 0.6 mg/dL (ref 0.3–1.2)
Total Protein: 6.8 g/dL (ref 6.5–8.1)

## 2018-08-11 LAB — MAGNESIUM: Magnesium: 2 mg/dL (ref 1.7–2.4)

## 2018-08-11 LAB — LIPASE, BLOOD: Lipase: 65 U/L — ABNORMAL HIGH (ref 11–51)

## 2018-08-11 LAB — TROPONIN I: Troponin I: <0.03 ng/mL (ref <0.03)

## 2018-08-11 MED ORDER — POTASSIUM CHLORIDE 10 MEQ/100ML IV SOLN
10.0000 meq | INTRAVENOUS | Status: AC
  Administered 2018-08-11 – 2018-08-12 (×3): 10 meq via INTRAVENOUS
  Filled 2018-08-11 (×3): qty 100

## 2018-08-11 MED ORDER — SODIUM CHLORIDE 0.9 % IV BOLUS
500.0000 mL | Freq: Once | INTRAVENOUS | Status: AC
  Administered 2018-08-11: 500 mL via INTRAVENOUS

## 2018-08-11 NOTE — ED Notes (Signed)
Pt did get dizzy while standing but was not wobblie. Pt stayed steady with little holding assistance.

## 2018-08-11 NOTE — ED Triage Notes (Signed)
Pt presents to the ED by [facility] w/ c/o of dizziness and weakness. Pt says he is dizzy all the time but the worst is when he is standing. + orthostatic changes with EMS.
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
## 2018-08-11 NOTE — ED Notes (Signed)
RN updated daughter

## 2018-08-12 ENCOUNTER — Emergency Department (HOSPITAL_COMMUNITY): Payer: Medicare Other

## 2018-08-12 ENCOUNTER — Observation Stay (HOSPITAL_COMMUNITY): Payer: Medicare Other

## 2018-08-12 ENCOUNTER — Ambulatory Visit (HOSPITAL_BASED_OUTPATIENT_CLINIC_OR_DEPARTMENT_OTHER): Payer: Medicare Other

## 2018-08-12 ENCOUNTER — Ambulatory Visit (HOSPITAL_COMMUNITY): Payer: Medicare Other

## 2018-08-12 ENCOUNTER — Other Ambulatory Visit: Payer: Self-pay

## 2018-08-12 DIAGNOSIS — I6521 Occlusion and stenosis of right carotid artery: Secondary | ICD-10-CM | POA: Diagnosis not present

## 2018-08-12 DIAGNOSIS — Z794 Long term (current) use of insulin: Secondary | ICD-10-CM

## 2018-08-12 DIAGNOSIS — R279 Unspecified lack of coordination: Secondary | ICD-10-CM | POA: Diagnosis not present

## 2018-08-12 DIAGNOSIS — F0390 Unspecified dementia without behavioral disturbance: Secondary | ICD-10-CM | POA: Diagnosis not present

## 2018-08-12 DIAGNOSIS — Z7982 Long term (current) use of aspirin: Secondary | ICD-10-CM | POA: Diagnosis not present

## 2018-08-12 DIAGNOSIS — Z8249 Family history of ischemic heart disease and other diseases of the circulatory system: Secondary | ICD-10-CM | POA: Diagnosis not present

## 2018-08-12 DIAGNOSIS — E1151 Type 2 diabetes mellitus with diabetic peripheral angiopathy without gangrene: Secondary | ICD-10-CM | POA: Diagnosis present

## 2018-08-12 DIAGNOSIS — N179 Acute kidney failure, unspecified: Secondary | ICD-10-CM

## 2018-08-12 DIAGNOSIS — R29702 NIHSS score 2: Secondary | ICD-10-CM | POA: Diagnosis present

## 2018-08-12 DIAGNOSIS — I739 Peripheral vascular disease, unspecified: Secondary | ICD-10-CM | POA: Diagnosis not present

## 2018-08-12 DIAGNOSIS — N183 Chronic kidney disease, stage 3 (moderate): Secondary | ICD-10-CM

## 2018-08-12 DIAGNOSIS — I639 Cerebral infarction, unspecified: Secondary | ICD-10-CM

## 2018-08-12 DIAGNOSIS — I951 Orthostatic hypotension: Secondary | ICD-10-CM

## 2018-08-12 DIAGNOSIS — H538 Other visual disturbances: Secondary | ICD-10-CM | POA: Diagnosis present

## 2018-08-12 DIAGNOSIS — E876 Hypokalemia: Secondary | ICD-10-CM

## 2018-08-12 DIAGNOSIS — G459 Transient cerebral ischemic attack, unspecified: Secondary | ICD-10-CM | POA: Diagnosis not present

## 2018-08-12 DIAGNOSIS — R270 Ataxia, unspecified: Secondary | ICD-10-CM | POA: Diagnosis present

## 2018-08-12 DIAGNOSIS — Z9841 Cataract extraction status, right eye: Secondary | ICD-10-CM | POA: Diagnosis not present

## 2018-08-12 DIAGNOSIS — E1122 Type 2 diabetes mellitus with diabetic chronic kidney disease: Secondary | ICD-10-CM | POA: Diagnosis present

## 2018-08-12 DIAGNOSIS — R8281 Pyuria: Secondary | ICD-10-CM | POA: Diagnosis not present

## 2018-08-12 DIAGNOSIS — Z1159 Encounter for screening for other viral diseases: Secondary | ICD-10-CM | POA: Diagnosis not present

## 2018-08-12 DIAGNOSIS — I10 Essential (primary) hypertension: Secondary | ICD-10-CM

## 2018-08-12 DIAGNOSIS — E278 Other specified disorders of adrenal gland: Secondary | ICD-10-CM | POA: Diagnosis not present

## 2018-08-12 DIAGNOSIS — Z888 Allergy status to other drugs, medicaments and biological substances status: Secondary | ICD-10-CM | POA: Diagnosis not present

## 2018-08-12 DIAGNOSIS — H269 Unspecified cataract: Secondary | ICD-10-CM | POA: Diagnosis not present

## 2018-08-12 DIAGNOSIS — F17210 Nicotine dependence, cigarettes, uncomplicated: Secondary | ICD-10-CM | POA: Diagnosis present

## 2018-08-12 DIAGNOSIS — I6389 Other cerebral infarction: Secondary | ICD-10-CM | POA: Diagnosis present

## 2018-08-12 DIAGNOSIS — Z9842 Cataract extraction status, left eye: Secondary | ICD-10-CM | POA: Diagnosis not present

## 2018-08-12 DIAGNOSIS — R42 Dizziness and giddiness: Secondary | ICD-10-CM | POA: Diagnosis not present

## 2018-08-12 DIAGNOSIS — G901 Familial dysautonomia [Riley-Day]: Secondary | ICD-10-CM | POA: Diagnosis present

## 2018-08-12 DIAGNOSIS — B952 Enterococcus as the cause of diseases classified elsewhere: Secondary | ICD-10-CM | POA: Diagnosis present

## 2018-08-12 DIAGNOSIS — Z833 Family history of diabetes mellitus: Secondary | ICD-10-CM | POA: Diagnosis not present

## 2018-08-12 DIAGNOSIS — D513 Other dietary vitamin B12 deficiency anemia: Secondary | ICD-10-CM | POA: Diagnosis not present

## 2018-08-12 DIAGNOSIS — I69959 Hemiplegia and hemiparesis following unspecified cerebrovascular disease affecting unspecified side: Secondary | ICD-10-CM | POA: Diagnosis not present

## 2018-08-12 DIAGNOSIS — D631 Anemia in chronic kidney disease: Secondary | ICD-10-CM | POA: Diagnosis not present

## 2018-08-12 DIAGNOSIS — Z743 Need for continuous supervision: Secondary | ICD-10-CM | POA: Diagnosis not present

## 2018-08-12 DIAGNOSIS — G9009 Other idiopathic peripheral autonomic neuropathy: Secondary | ICD-10-CM | POA: Diagnosis not present

## 2018-08-12 DIAGNOSIS — I6601 Occlusion and stenosis of right middle cerebral artery: Secondary | ICD-10-CM | POA: Diagnosis not present

## 2018-08-12 DIAGNOSIS — I69398 Other sequelae of cerebral infarction: Secondary | ICD-10-CM | POA: Diagnosis not present

## 2018-08-12 DIAGNOSIS — M4800 Spinal stenosis, site unspecified: Secondary | ICD-10-CM | POA: Diagnosis not present

## 2018-08-12 DIAGNOSIS — F339 Major depressive disorder, recurrent, unspecified: Secondary | ICD-10-CM | POA: Diagnosis not present

## 2018-08-12 DIAGNOSIS — Z7902 Long term (current) use of antithrombotics/antiplatelets: Secondary | ICD-10-CM | POA: Diagnosis not present

## 2018-08-12 DIAGNOSIS — E785 Hyperlipidemia, unspecified: Secondary | ICD-10-CM | POA: Diagnosis present

## 2018-08-12 DIAGNOSIS — N390 Urinary tract infection, site not specified: Secondary | ICD-10-CM | POA: Diagnosis present

## 2018-08-12 DIAGNOSIS — M6281 Muscle weakness (generalized): Secondary | ICD-10-CM | POA: Diagnosis not present

## 2018-08-12 DIAGNOSIS — F17200 Nicotine dependence, unspecified, uncomplicated: Secondary | ICD-10-CM | POA: Diagnosis not present

## 2018-08-12 DIAGNOSIS — R918 Other nonspecific abnormal finding of lung field: Secondary | ICD-10-CM | POA: Diagnosis not present

## 2018-08-12 DIAGNOSIS — R296 Repeated falls: Secondary | ICD-10-CM | POA: Diagnosis not present

## 2018-08-12 DIAGNOSIS — I6502 Occlusion and stenosis of left vertebral artery: Secondary | ICD-10-CM | POA: Diagnosis not present

## 2018-08-12 HISTORY — DX: Cerebral infarction, unspecified: I63.9

## 2018-08-12 LAB — GLUCOSE, CAPILLARY
Glucose-Capillary: 179 mg/dL — ABNORMAL HIGH (ref 70–99)
Glucose-Capillary: 105 mg/dL — ABNORMAL HIGH (ref 70–99)

## 2018-08-12 LAB — URINALYSIS, ROUTINE W REFLEX MICROSCOPIC
Bilirubin Urine: NEGATIVE
Glucose, UA: 150 mg/dL — AB
Ketones, ur: NEGATIVE mg/dL
Nitrite: NEGATIVE
Protein, ur: 30 mg/dL — AB
Specific Gravity, Urine: 1.016 (ref 1.005–1.030)
WBC, UA: >50 WBC/hpf — ABNORMAL HIGH (ref 0–5)
pH: 6 (ref 5.0–8.0)

## 2018-08-12 LAB — LIPID PANEL
Cholesterol: 99 mg/dL (ref 0–200)
HDL: 31 mg/dL — ABNORMAL LOW (ref >40)
LDL Cholesterol: 52 mg/dL (ref 0–99)
Total CHOL/HDL Ratio: 3.2 RATIO
Triglycerides: 81 mg/dL (ref <150)
VLDL: 16 mg/dL (ref 0–40)

## 2018-08-12 LAB — ECHOCARDIOGRAM COMPLETE
Height: 72 in
Weight: 2880 oz

## 2018-08-12 LAB — CBG MONITORING, ED
Glucose-Capillary: 181 mg/dL — ABNORMAL HIGH (ref 70–99)
Glucose-Capillary: 158 mg/dL — ABNORMAL HIGH (ref 70–99)
Glucose-Capillary: 64 mg/dL — ABNORMAL LOW (ref 70–99)
Glucose-Capillary: 97 mg/dL (ref 70–99)

## 2018-08-12 LAB — HEMOGLOBIN A1C
Hgb A1c MFr Bld: 8.2 % — ABNORMAL HIGH (ref 4.8–5.6)
Mean Plasma Glucose: 188.64 mg/dL

## 2018-08-12 LAB — SARS CORONAVIRUS 2 BY RT PCR (HOSPITAL ORDER, PERFORMED IN ~~LOC~~ HOSPITAL LAB): SARS Coronavirus 2: NEGATIVE

## 2018-08-12 MED ORDER — INSULIN ASPART 100 UNIT/ML ~~LOC~~ SOLN
0.0000 [IU] | Freq: Three times a day (TID) | SUBCUTANEOUS | Status: DC
  Administered 2018-08-12 – 2018-08-13 (×3): 2 [IU] via SUBCUTANEOUS
  Administered 2018-08-13: 13:00:00 3 [IU] via SUBCUTANEOUS
  Administered 2018-08-14: 13:00:00 2 [IU] via SUBCUTANEOUS
  Administered 2018-08-14 (×2): 1 [IU] via SUBCUTANEOUS
  Administered 2018-08-15 (×2): 2 [IU] via SUBCUTANEOUS
  Administered 2018-08-15: 1 [IU] via SUBCUTANEOUS
  Administered 2018-08-16: 13:00:00 3 [IU] via SUBCUTANEOUS
  Administered 2018-08-16 – 2018-08-17 (×3): 2 [IU] via SUBCUTANEOUS
  Administered 2018-08-17 – 2018-08-18 (×2): 1 [IU] via SUBCUTANEOUS
  Administered 2018-08-18 (×2): 3 [IU] via SUBCUTANEOUS
  Administered 2018-08-19: 1 [IU] via SUBCUTANEOUS

## 2018-08-12 MED ORDER — ROSUVASTATIN CALCIUM 5 MG PO TABS
5.0000 mg | ORAL_TABLET | Freq: Every day | ORAL | Status: DC
  Administered 2018-08-12 – 2018-08-19 (×8): 5 mg via ORAL
  Filled 2018-08-12 (×9): qty 1

## 2018-08-12 MED ORDER — SODIUM CHLORIDE 0.9 % IV BOLUS
1000.0000 mL | Freq: Once | INTRAVENOUS | Status: AC
  Administered 2018-08-12: 1000 mL via INTRAVENOUS

## 2018-08-12 MED ORDER — BASAGLAR KWIKPEN 100 UNIT/ML ~~LOC~~ SOPN: 14.0000 [IU] | PEN_INJECTOR | Freq: Every day | SUBCUTANEOUS | Status: DC

## 2018-08-12 MED ORDER — INSULIN GLARGINE 100 UNIT/ML ~~LOC~~ SOLN
8.0000 [IU] | Freq: Every day | SUBCUTANEOUS | Status: DC
  Administered 2018-08-12: 21:00:00 8 [IU] via SUBCUTANEOUS
  Filled 2018-08-12 (×2): qty 0.08

## 2018-08-12 MED ORDER — STROKE: EARLY STAGES OF RECOVERY BOOK: Freq: Once | Status: DC

## 2018-08-12 MED ORDER — INSULIN GLARGINE 100 UNIT/ML ~~LOC~~ SOLN
10.0000 [IU] | Freq: Every day | SUBCUTANEOUS | Status: DC
  Administered 2018-08-12: 10 [IU] via SUBCUTANEOUS
  Filled 2018-08-12 (×3): qty 0.1

## 2018-08-12 MED ORDER — PYRIDOSTIGMINE BROMIDE 60 MG PO TABS: 30.0000 mg | ORAL_TABLET | Freq: Two times a day (BID) | ORAL | Status: DC

## 2018-08-12 MED ORDER — POTASSIUM CHLORIDE CRYS ER 20 MEQ PO TBCR
40.0000 meq | EXTENDED_RELEASE_TABLET | Freq: Once | ORAL | Status: AC
  Administered 2018-08-12: 06:00:00 40 meq via ORAL
  Filled 2018-08-12: qty 2

## 2018-08-12 MED ORDER — FLUDROCORTISONE ACETATE 0.1 MG PO TABS: 0.1000 mg | ORAL_TABLET | Freq: Every day | ORAL | Status: DC

## 2018-08-12 MED ORDER — ACETAMINOPHEN 650 MG RE SUPP: 650.0000 mg | RECTAL | Status: DC | PRN

## 2018-08-12 MED ORDER — POLYVINYL ALCOHOL 1.4 % OP SOLN
1.0000 [drp] | Freq: Two times a day (BID) | OPHTHALMIC | Status: DC | PRN
  Administered 2018-08-13 – 2018-08-18 (×4): 1 [drp] via OPHTHALMIC
  Filled 2018-08-12 (×2): qty 15

## 2018-08-12 MED ORDER — SODIUM CHLORIDE 0.9 % IV SOLN
1.0000 g | Freq: Once | INTRAVENOUS | Status: AC
  Administered 2018-08-12: 1 g via INTRAVENOUS
  Filled 2018-08-12: qty 10

## 2018-08-12 MED ORDER — ASPIRIN EC 81 MG PO TBEC
81.0000 mg | DELAYED_RELEASE_TABLET | Freq: Every day | ORAL | Status: DC
  Administered 2018-08-12 – 2018-08-19 (×8): 81 mg via ORAL
  Filled 2018-08-12 (×8): qty 1

## 2018-08-12 MED ORDER — CLOPIDOGREL BISULFATE 75 MG PO TABS
75.0000 mg | ORAL_TABLET | Freq: Every day | ORAL | Status: DC
  Administered 2018-08-12 – 2018-08-19 (×8): 75 mg via ORAL
  Filled 2018-08-12 (×8): qty 1

## 2018-08-12 MED ORDER — SERTRALINE HCL 25 MG PO TABS
25.0000 mg | ORAL_TABLET | Freq: Every day | ORAL | Status: DC
  Administered 2018-08-12 – 2018-08-19 (×8): 25 mg via ORAL
  Filled 2018-08-12 (×9): qty 1

## 2018-08-12 MED ORDER — ACETAMINOPHEN 325 MG PO TABS
650.0000 mg | ORAL_TABLET | ORAL | Status: DC | PRN
  Administered 2018-08-15: 10:00:00 650 mg via ORAL
  Filled 2018-08-12: qty 2

## 2018-08-12 MED ORDER — ACETAMINOPHEN 160 MG/5ML PO SOLN: 650.0000 mg | ORAL | Status: DC | PRN

## 2018-08-12 MED ORDER — ENOXAPARIN SODIUM 40 MG/0.4ML ~~LOC~~ SOLN
40.0000 mg | SUBCUTANEOUS | Status: DC
  Administered 2018-08-12 – 2018-08-19 (×8): 40 mg via SUBCUTANEOUS
  Filled 2018-08-12 (×9): qty 0.4

## 2018-08-12 NOTE — Progress Notes (Signed)
PT Cancellation Note  Patient Details Name: Harry [name] MRN: [id number] DOB: 1945/07/22   Cancelled Treatment:    Reason Eval/Treat Not Completed: Medical issues which prohibited therapy Pt with elevated BP. Will hold until pt medically appropriate and follow up as schedule allows.   Leighton Ruff, PT, DPT  Acute Rehabilitation Services  Pager: [phone] Office: [phone]    Rudean Hitt 08/12/2018, 1:15 PM

## 2018-08-12 NOTE — ED Notes (Signed)
Rn called pharmacy to have meds verified

## 2018-08-12 NOTE — Progress Notes (Signed)
  Echocardiogram 2D Echocardiogram has been performed.  Harry [name] 08/12/2018, 10:57 AM

## 2018-08-12 NOTE — ED Provider Notes (Signed)
Care assumed from Dr. Laverta Baltimore.  Patient awaiting MRI to evaluate for dizziness and vertigo.  He does have AKI, UTI and hypokalemia that has been treated.  MRI positive for several PCA area acute infarcts. As well as possible infarct of long corona radiata.  Patient will be admitted for these new likely embolic strokes as well as AKI and urinary tract infection.  Discussed with Dr. [name] of neurology.  Admission discussed with Dr. Alcario Drought. Patient updated and daughter updated by phone.   Ezequiel Essex, MD 08/12/18 [phone]

## 2018-08-12 NOTE — Progress Notes (Signed)
Bilateral carotid duplex completed. Preliminary results in Chart review CV Proc. [place] Zikeria Keough,RVS 08/12/2018, 12:25 PM

## 2018-08-12 NOTE — ED Notes (Signed)
Ordered bfast tray 
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
## 2018-08-12 NOTE — ED Notes (Signed)
CBG 97 notified Ronalee Belts, Therapist, sports.

## 2018-08-12 NOTE — Progress Notes (Signed)
Inpatient Diabetes Program Recommendations  AACE/ADA: New Consensus Statement on Inpatient Glycemic Control (2015)  Target Ranges:  Prepandial:   less than 140 mg/dL      Peak postprandial:   less than 180 mg/dL (1-2 hours)      Critically ill patients:  140 - 180 mg/dL   Lab Results  Component Value Date   GLUCAP 64 (L) 08/12/2018   HGBA1C 8.2 (H) 08/12/2018    Review of Glycemic Control Results for Harry [name], Harry [name] (MRN [id number]) as of 08/12/2018 12:41  Ref. Range 08/12/2018 05:49 08/12/2018 08:03 08/12/2018 12:22  Glucose-Capillary Latest Ref Range: 70 - 99 mg/dL 181 (H) 158 (H) 64 (L)   Diabetes history: Type 2 DM Outpatient Diabetes medications: Basaglar 14 units QHS Current orders for Inpatient glycemic control: Novolog 0-9 units TID, Lantus 10 units QHS  Inpatient Diabetes Program Recommendations:    Noted mild hypoglycemia of 64 mg/dL. If to remain inpatient, consider decreasing Lantus 8 units QHS.   Thanks, Bronson Curb, MSN, RNC-OB Diabetes Coordinator [phone] (8a-5p)

## 2018-08-12 NOTE — ED Notes (Signed)
Patient transported to MRI 
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
## 2018-08-12 NOTE — ED Notes (Signed)
Pt given OJ and crackers; meal ordered prior to CBG check. Pt states he "feels fine". No lethargy noted. Pt is alert and oriented at this time.

## 2018-08-12 NOTE — ED Notes (Signed)
Dr. Eliseo Squires notified of HTN

## 2018-08-12 NOTE — Progress Notes (Signed)
STROKE TEAM PROGRESS NOTE   INTERVAL HISTORY I have personally reviewed history of presenting illness with the patient.  The patient appears to be confused and denies focal neurological symptoms suggestive of a stroke.  I suspect that he does   have baseline history of dementia  Vitals:   08/12/18 0735 08/12/18 0739 08/12/18 0745 08/12/18 0745  BP: (!) 220/110 (!) 200/92 (!) 185/91   Pulse: 86  79   Resp: 20  17   Temp:      TempSrc:      SpO2: 98%  97%   Weight:    81.6 kg  Height:    6' (1.829 m)    CBC:  Recent Labs  Lab 08/11/18 2015  WBC 5.0  NEUTROABS 2.7  HGB 8.4*  HCT 26.7*  MCV 86.4  PLT 086    Basic Metabolic Panel:  Recent Labs  Lab 08/11/18 2015  NA 140  K 3.0*  CL 103  CO2 27  GLUCOSE 184*  BUN 27*  CREATININE 2.37*  CALCIUM 8.2*  MG 2.0   Lipid Panel:     Component Value Date/Time   CHOL 99 08/12/2018 0322   TRIG 81 08/12/2018 0322   TRIG 144 12/14/2005 1558   HDL 31 (L) 08/12/2018 0322   CHOLHDL 3.2 08/12/2018 0322   VLDL 16 08/12/2018 0322   LDLCALC 52 08/12/2018 0322   HgbA1c:  Lab Results  Component Value Date   HGBA1C 8.2 (H) 08/12/2018   Urine Drug Screen:     Component Value Date/Time   LABOPIA NONE DETECTED 02/24/2018 1518   COCAINSCRNUR NONE DETECTED 02/24/2018 1518   LABBENZ NONE DETECTED 02/24/2018 1518   AMPHETMU NONE DETECTED 02/24/2018 1518   THCU NONE DETECTED 02/24/2018 1518   LABBARB NONE DETECTED 02/24/2018 1518    Alcohol Level     Component Value Date/Time   ETH <10 11/22/2017 2044   PHYSICAL EXAM Elderly unkempt African-American [name] not in distress. . Afebrile. Head is nontraumatic. Neck is supple without bruit.    Cardiac exam no murmur or gallop. Lungs are clear to auscultation. Distal pulses are well felt. Neurological Exam ;  Awake  Alert disoriented x 3.  Diminished attention, registration and recall.  Poor insight into her illness.  Normal speech and language.eye movements full without nystagmus.   Is blind in the left eye at baseline..fundi were not visualized.  Suspect right peripheral visual field loss.  Hearing is normal. Palatal movements are normal. Face symmetric. Tongue midline. Normal strength, tone, reflexes and coordination. Normal sensation. Gait deferred.  ASSESSMENT/PLAN Harry [name] is a 73 y.o. [name] with history of HTN, HLD, DM, CKD, PVD, tobacco abuse and prior stroke presenting with 2 weeks of dizziness, difficulty walking and weakness worse with standing, orthosttic.   Stroke:   L PCA and R frontal corona radiata infarcts secondary to intracranial atherosclerosis in setting of multiple risk factors  MRI  2 small L PCA infarcts (L thalamus, L parasagittal temporal occipital) and R frontal corona radiata. Multiple old PCA infarcts. Small vessel disease. Atrophy.   MRA  L VA, Distal R P1 and proximal superior R P3 mod to high grade stenoses. R ICA severe precavernous stenosis.  Superior R M2 high grad stenosis. Extensive L ICA atherosclerosis.   Carotid Doppler bilateral 1-39% carotid stenosis.  2D Echo ejection fraction 50 to 55%.  No wall motion abnormalities.    LDL 52  HgbA1c 8.2  Lovenox 40 mg sq daily for VTE prophylaxis  Diet - carb modified thin liquids  aspirin 81 mg daily and clopidogrel 75 mg daily prior to admission, now on aspirin 81 mg daily and clopidogrel 75 mg daily. Continue DAPT at d/c  Therapy recommendations:  pending   Disposition:  pending   Hypertensive Urgency  BP on arrival 220/110 . Unstable - 170-190s supine drops to low 100s when stands . Permissive hypertension (OK if < 220/120) but gradually normalize in 5-7 days . Long-term BP goal normotensive  Hyperlipidemia  Home meds:  crestor 5, resumed in hospital  LDL 52, goal < 70  Continue statin at discharge  Diabetes type II Uncontrolled  HgbA1c 8.2, goal < 7.0  Other Stroke Risk Factors  Advanced age  Cigarette smoker, advised to stop smoking  ETOH use,  advised to drink no more than 2 drink(s) a day  Hx stroke/TIA  1999 - no residual, details not available  Coronary artery disease s/p PCI  PVD   Other Active Problems  Moderate Vascular dementia w/ behavior disturbance followed by Dr. 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  He presented with couple of weeks of dizziness and gait difficulties along with poor intake and MRI shows left PCA territory infarcts likely secondary to intracranial atherosclerosis though he does have underlying small vessel disease as well.  Recommend aspirin and Plavix dual antiplatelet therapy for 3 months due to intracranial atherosclerosis followed by aspirin alone.  Aggressive risk factor modification.  Therapy consults.  Discussed with Dr. Eliseo Squires.  Greater than 50% time during this 25-minute visit was spent on counseling and coordination of care about his strokes and answering questions.  Follow-up as an outpatient with Dr. Leta Baptist I his neurologist in the office after discharge.  Stroke team will sign off.  Kindly call for questions. Antony Contras, MD Medical Director [facility] Pager: [phone] 08/12/2018 2:48 PM   To contact Stroke Continuity provider, please refer to [email or web address] After hours, contact General Neurology

## 2018-08-12 NOTE — ED Notes (Signed)
Pt has been changed after incontinence episode. Pt is sitting up and eating lunch. New linens have been put on bed and condom cath is on pt.

## 2018-08-12 NOTE — ED Notes (Signed)
Harry [name], [place], at bedside; aware of HTN, no intervention/orders received at this time; will continue to montior

## 2018-08-12 NOTE — Progress Notes (Signed)
Patient admitted after midnight. In short presented with dizziness and weakness. Work up reveals L PCA and R frontal corona radiata infarcts secondary to small vessel disease. Stroke team on board.   Patients BP elevated. Per neurology, allow for permissive hypertension (less than 220/120) with plan to gradually normalize in 5-7 days.   P/E Gen: awake alert oriented x3 CV: rrr no mgr trace LE edema Resp: no increased work of breathing BS clear bilaterally Abd: non-distended non-tender +BS Neuro: speech clear facial symmetry, bilateral grip 5/5 moving all extremities    Santiago Glad m. Miski Feldpausch, NP

## 2018-08-12 NOTE — ED Notes (Signed)
Checked patient blood sugar it was 64 notified RN Ronalee Belts of blood sugar patient is resting with call bell in reach

## 2018-08-12 NOTE — ED Notes (Signed)
Pt updated on bed assignment. No reported pain at this time.

## 2018-08-12 NOTE — ED Notes (Signed)
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 aspart (  novoLOG) injection 0-9 Units (0 Units Subcutaneous Not Given 08/12/18 1227)  insulin glargine (LANTUS) injection 8 Units (has no administration in time range)  sodium chloride 0.9 % bolus 500 mL (0 mLs Intravenous Stopped 08/12/18 0049)  potassium chloride 10 mEq in 100 mL IVPB (0 mEq Intravenous Stopped 08/12/18 0127)  sodium chloride 0.9 % bolus 1,000 mL (0 mLs Intravenous Stopped 08/12/18 0305)  cefTRIAXone (ROCEPHIN) 1 g in sodium chloride 0.9 % 100 mL IVPB (0 g Intravenous Stopped 08/12/18 0140)  potassium chloride SA (K-DUR) CR tablet 40 mEq (40 mEq Oral Given 08/12/18 0612)    Mobility walks Moderate fall risk    Focused Assessments Neuro Assessment Handoff:  Swallow screen pass? Yes  Cardiac Rhythm: Normal sinus rhythm NIH Stroke Scale ( + Modified Stroke Scale Criteria)  Interval: Initial Level of Consciousness (1a.)   : Alert, keenly responsive LOC Questions (1b. )   +: Answers both questions correctly LOC Commands (1c. )   + : Performs both tasks correctly Best Gaze (2. )  +: Normal Visual (3. )  +: No visual loss Facial Palsy (4. )    : Minor paralysis Motor Arm, Left (5a. )   +: No drift Motor Arm, Right (5b. )   +: No drift Motor Leg, Left (6a. )   +: No drift Motor Leg, Right (6b. )   +: No drift Limb Ataxia (7. ): Absent Sensory (8. )   +: Normal, no sensory loss Best Language (9. )   +: No aphasia Dysarthria (10. ): Normal Extinction/Inattention (11.)   +: No Abnormality Modified SS Total  +: 0 Complete NIHSS TOTAL: 2 Last date known well: 08/11/18   Neuro Assessment: Within Defined Limits Neuro Checks:   Initial (08/12/18 0000)  Last Documented NIHSS Modified Score: 0 (08/12/18 0941) Has TPA been given? No If patient is a Neuro Trauma and patient is going to OR before floor call report to [place] nurse: [phone] or [phone]     R Recommendations: See Admitting Provider Note  Report given to:   Additional Notes: none

## 2018-08-13 LAB — GLUCOSE, CAPILLARY
Glucose-Capillary: 49 mg/dL — ABNORMAL LOW (ref 70–99)
Glucose-Capillary: 53 mg/dL — ABNORMAL LOW (ref 70–99)
Glucose-Capillary: 54 mg/dL — ABNORMAL LOW (ref 70–99)
Glucose-Capillary: 61 mg/dL — ABNORMAL LOW (ref 70–99)
Glucose-Capillary: 244 mg/dL — ABNORMAL HIGH (ref 70–99)
Glucose-Capillary: 169 mg/dL — ABNORMAL HIGH (ref 70–99)
Glucose-Capillary: 170 mg/dL — ABNORMAL HIGH (ref 70–99)

## 2018-08-13 MED ORDER — SODIUM CHLORIDE 0.9 % IV SOLN
1.0000 g | Freq: Four times a day (QID) | INTRAVENOUS | Status: DC
  Administered 2018-08-13 – 2018-08-19 (×22): 1 g via INTRAVENOUS
  Filled 2018-08-13 (×25): qty 1000

## 2018-08-13 MED ORDER — NICOTINE 14 MG/24HR TD PT24
14.0000 mg | MEDICATED_PATCH | Freq: Every day | TRANSDERMAL | Status: DC
  Administered 2018-08-13 – 2018-08-16 (×4): 14 mg via TRANSDERMAL
  Filled 2018-08-13 (×5): qty 1

## 2018-08-13 NOTE — Progress Notes (Signed)
Pharmacy Antibiotic Note  Harry [name] is a 73 y.o. male admitted on 08/11/2018 with dizziness and weakness.  Pharmacy has been consulted for ampicillin dosing for E.faecalis UTI.  SCr 2.37 (BL SCr 1.4-1.6), CrCL 31 ml/min, afebrile, WBC WNL.  Plan: Ampicillin 1gm IV Q6H Monitor renal fxn, C&S, clinical progress BMET in AM   Height: 6' (182.9 cm) Weight: 180 lb (81.6 kg) IBW/kg (Calculated) : 77.6  Temp (24hrs), Avg:98.2 F (36.8 C), Min:97.8 F (36.6 C), Max:98.5 F (36.9 C)  Recent Labs  Lab 08/11/18 2015  WBC 5.0  CREATININE 2.37*    Estimated Creatinine Clearance: 31.4 mL/min (A) (by C-G formula based on SCr of 2.37 mg/dL (H)).    Allergies  Allergen Reactions  . Lipitor [Atorvastatin] Other (See Comments)    Myalgia   . Statins Other (See Comments)    Myalgia (CAN tolerate Crestor, however)  . Pravachol [Pravastatin] Rash    Amp 6/10 >> CTX x1 6/9  6/8 UCx - E.faecalis 6/9 covid - negative  [name] Marble, PharmD, BCPS, [place] 08/13/2018, 5:29 PM

## 2018-08-13 NOTE — Evaluation (Addendum)
Physical Therapy Evaluation Patient Details Name: Harry [name] MRN: [id number] DOB: [birth year] Today's Date: 08/13/2018   History of Present Illness  Pt is a 73 y/o male admitted secondary to increased dizziness and weakness. MRI revealed L PCA territory infarct. PMH includes DM, CKD, HTN, dementia, CAD s/p stent, and CVA.   Clinical Impression  Pt admitted secondary to problem above with deficits below. Pt requiring min guard A for short distance ambulation with RW. Pt refusing further distance secondary to fatigue and dizziness. Educated about use of RW to increase safety. Per case management, pt's daughter unable to care for pt at d/c, so recommending SNF level therapy. Will continue to follow acutely to maximize functional mobility independence and safety.     Follow Up Recommendations SNF;Supervision/Assistance - 24 hour    Equipment Recommendations  Rolling walker with 5" wheels    Recommendations for Other Services       Precautions / Restrictions Precautions Precautions: Fall Restrictions Weight Bearing Restrictions: No      Mobility  Bed Mobility Overal bed mobility: Needs Assistance Bed Mobility: Supine to Sit;Sit to Supine     Supine to sit: Supervision Sit to supine: Supervision   General bed mobility comments: Supervision for safety. Increased time required.   Transfers Overall transfer level: Needs assistance Equipment used: Rolling walker (2 wheeled) Transfers: Sit to/from Stand Sit to Stand: Min guard         General transfer comment: Min guard for steadying assist. Cues for safe hand placement.   Ambulation/Gait Ambulation/Gait assistance: Min guard Gait Distance (Feet): 5 Feet Assistive device: Rolling walker (2 wheeled) Gait Pattern/deviations: Step-through pattern;Decreased stride length Gait velocity: Decreased    General Gait Details: Slow, mildly unsteady gait. Pt reporting some dizziness during mobility. Did not want to ambulate  further secondary to fatigue. Educated about using RW at home to increase safety with mobility.   Stairs            Wheelchair Mobility    Modified Rankin (Stroke Patients Only) Modified Rankin (Stroke Patients Only) Pre-Morbid Rankin Score: Moderate disability Modified Rankin: Moderately severe disability     Balance Overall balance assessment: Needs assistance Sitting-balance support: No upper extremity supported;Feet supported Sitting balance-Leahy Scale: Fair     Standing balance support: Bilateral upper extremity supported;During functional activity Standing balance-Leahy Scale: Poor Standing balance comment: Reliant on BUE suport                              Pertinent Vitals/Pain Pain Assessment: No/denies pain    Home Living Family/patient expects to be discharged to:: Private residence Living Arrangements: Children Available Help at Discharge: Family;Available 24 hours/day Type of Home: House Home Access: Level entry     Home Layout: Two level Home Equipment: Walker - 4 wheels      Prior Function Level of Independence: Needs assistance   Gait / Transfers Assistance Needed: PT uses rollator for ambulation   ADL's / Homemaking Assistance Needed: ADLs mostly independent and IADL provided for him.        Hand Dominance        Extremity/Trunk Assessment   Upper Extremity Assessment Upper Extremity Assessment: Defer to OT evaluation    Lower Extremity Assessment Lower Extremity Assessment: Generalized weakness    Cervical / Trunk Assessment Cervical / Trunk Assessment: Kyphotic  Communication   Communication: HOH  Cognition Arousal/Alertness: Awake/alert Behavior During Therapy: WFL for tasks assessed/performed Overall Cognitive Status: History  of cognitive impairments - at baseline                                        General Comments General comments (skin integrity, edema, etc.): Pt reports decreased  vision in L eye at baseline. Reports blurry vision in R eye during session     Exercises     Assessment/Plan    PT Assessment Patient needs continued PT services  PT Problem List Decreased strength;Decreased balance;Decreased mobility;Decreased activity tolerance;Decreased cognition;Decreased knowledge of use of DME;Decreased safety awareness;Decreased knowledge of precautions       PT Treatment Interventions DME instruction;Gait training;Stair training;Functional mobility training;Therapeutic activities;Therapeutic exercise;Balance training;Patient/family education    PT Goals (Current goals can be found in the Care Plan section)  Acute Rehab PT Goals Patient Stated Goal: to go home PT Goal Formulation: With patient Time For Goal Achievement: 08/27/18 Potential to Achieve Goals: Good    Frequency Min 4X/week   Barriers to discharge        Co-evaluation               AM-PAC PT "6 Clicks" Mobility  Outcome Measure Help needed turning from your back to your side while in a flat bed without using bedrails?: A Little Help needed moving from lying on your back to sitting on the side of a flat bed without using bedrails?: A Little Help needed moving to and from a bed to a chair (including a wheelchair)?: A Little Help needed standing up from a chair using your arms (e.g., wheelchair or bedside chair)?: A Little Help needed to walk in hospital room?: A Little Help needed climbing 3-5 steps with a railing? : A Lot 6 Click Score: 17    End of Session Equipment Utilized During Treatment: Gait belt Activity Tolerance: Patient tolerated treatment well Patient left: in bed;with call bell/phone within reach;with bed alarm set Nurse Communication: Mobility status PT Visit Diagnosis: Unsteadiness on feet (R26.81);Muscle weakness (generalized) (M62.81)    Time: 5830-9407 PT Time Calculation (min) (ACUTE ONLY): 18 min   Charges:   PT Evaluation $PT Eval Moderate Complexity: [place], PT, DPT  Acute Rehabilitation Services  Pager: [phone] Office: [phone]   Rudean Hitt 08/13/2018, 4:32 PM

## 2018-08-13 NOTE — Evaluation (Signed)
Occupational Therapy Evaluation Patient Details Name: Harry [name] MRN: [id number] DOB: [birth year] Today's Date: 08/13/2018    History of Present Illness Pt is a 73 y/o male admitted secondary to increased dizziness and weakness. MRI revealed L PCA territory infarct. PMH includes DM, CKD, HTN, dementia, CAD s/p stent, and CVA.    Clinical Impression   PTA: pt living with daughter, supervision to independent level with ADL and IADL provided for him. Pt limited by decreased strength and balance, poor vision, and poor ability to compensate with visual scanning.Pt performing ADL functional mobility with RW and minA overall for avoiding obstacles. Pt performing grooming tasks at sink in standing x3 mins and requiring minA for toilet hygiene. Pt performing ADL overall with minA and requiring cues for compensation of poor vision in bilateral eyes to avoid obstacles. Poor depth perception noted with objects directly below is line of vision and poor vision in inferior fields to continue to test and educate pt upon returning home. Pt would benefit from continued OT skilled services for ADL, mobility and visual scanning. OT to following acutely.      Follow Up Recommendations  Home health OT;Supervision/Assistance - 24 hour    Equipment Recommendations  None recommended by OT    Recommendations for Other Services       Precautions / Restrictions Precautions Precautions: Fall Restrictions Weight Bearing Restrictions: No      Mobility Bed Mobility Overal bed mobility: Needs Assistance Bed Mobility: Supine to Sit;Sit to Supine     Supine to sit: Supervision Sit to supine: Supervision   General bed mobility comments: Supervision for safety. Increased time required.   Transfers Overall transfer level: Needs assistance Equipment used: Rolling walker (2 wheeled) Transfers: Sit to/from Stand Sit to Stand: Min guard         General transfer comment: Min guard for steadying assist.  Cues for safe hand placement.     Balance Overall balance assessment: Needs assistance Sitting-balance support: No upper extremity supported;Feet supported Sitting balance-Leahy Scale: Fair     Standing balance support: Bilateral upper extremity supported;During functional activity Standing balance-Leahy Scale: Poor Standing balance comment: Reliant on BUE suport                            ADL either performed or assessed with clinical judgement   ADL Overall ADL's : Needs assistance/impaired Eating/Feeding: Set up;Cueing for safety;Cueing for sequencing;Sitting   Grooming: Wash/dry hands;Wash/dry face;Oral care;Standing;Min guard   Upper Body Bathing: Min guard;Sitting;Standing   Lower Body Bathing: Minimal assistance;Sitting/lateral leans;Sit to/from stand   Upper Body Dressing : Min guard;Sitting;Standing   Lower Body Dressing: Minimal assistance;Sitting/lateral leans;Sit to/from stand   Toilet Transfer: Minimal assistance;RW   Toileting- Clothing Manipulation and Hygiene: Moderate assistance;Sitting/lateral lean;Sit to/from stand       Functional mobility during ADLs: Rolling walker;Minimal assistance;Cueing for safety;Cueing for sequencing General ADL Comments: set-upA to modA for ADL; cues for sequencing     Vision Baseline Vision/History: (Ri visual field cut) Vision Assessment?: Yes Eye Alignment: Within Functional Limits Tracking/Visual Pursuits: Decreased smoothness of eye movement to RIGHT inferior field;Decreased smoothness of eye movement to LEFT inferior field Saccades: Within functional limits Convergence: Impaired - to be further tested in functional context Additional Comments: Pt with R visual field cut in lower quadrant; b/l eyes are affected. (L eye at baseline)     Perception Perception Perception Tested?: Yes Spatial deficits: Pt difficult to see items right under him.  Praxis      Pertinent Vitals/Pain Pain Assessment: No/denies  pain     Hand Dominance     Extremity/Trunk Assessment Upper Extremity Assessment Upper Extremity Assessment: Generalized weakness   Lower Extremity Assessment Lower Extremity Assessment: Defer to PT evaluation;Generalized weakness   Cervical / Trunk Assessment Cervical / Trunk Assessment: Kyphotic   Communication Communication Communication: HOH   Cognition Arousal/Alertness: Awake/alert Behavior During Therapy: WFL for tasks assessed/performed Overall Cognitive Status: History of cognitive impairments - at baseline                                     General Comments  Pt reports decreased vision in L eye at baseline. Reports blurry vision in R eye during session. BP in sitting 103/61; standing 84/52; sit after 3 mins 112/68 and standing 92/58 with dizziness noted.     Exercises     Shoulder Instructions      Home Living Family/patient expects to be discharged to:: Private residence Living Arrangements: Children Available Help at Discharge: Family;Available 24 hours/day Type of Home: House Home Access: Level entry     Home Layout: Two level Alternate Level Stairs-Number of Steps: 14 Alternate Level Stairs-Rails: Right;Left;Can reach both Bathroom Shower/Tub: Teacher, early years/pre: Standard     Home Equipment: Environmental consultant - 4 wheels          Prior Functioning/Environment Level of Independence: Needs assistance  Gait / Transfers Assistance Needed: PT uses rollator for ambulation  ADL's / Homemaking Assistance Needed: ADLs mostly independent and IADL provided for him.            OT Problem List: Decreased strength;Decreased activity tolerance;Impaired balance (sitting and/or standing);Decreased coordination;Impaired UE functional use;Decreased safety awareness      OT Treatment/Interventions: Therapeutic exercise;Self-care/ADL training;Neuromuscular education;Energy conservation;Therapeutic activities;Patient/family  education;Balance training;Visual/perceptual remediation/compensation    OT Goals(Current goals can be found in the care plan section) Acute Rehab OT Goals Patient Stated Goal: to go home OT Goal Formulation: With patient Time For Goal Achievement: 08/27/18 Potential to Achieve Goals: Good ADL Goals Pt Will Perform Grooming: with modified independence;standing Pt Will Perform Lower Body Dressing: with modified independence;sit to/from stand Pt Will Perform Toileting - Clothing Manipulation and hygiene: with modified independence;sit to/from stand Additional ADL Goal #1: Pt S with OOB ADL with fair balance Additional ADL Goal #2: Pt will compensate with visual deficits with visual scanning and head turning to avoid obstacles with minimal cues.  OT Frequency: Min 2X/week   Barriers to D/C:            Co-evaluation              AM-PAC OT "6 Clicks" Daily Activity     Outcome Measure Help from another person eating meals?: A Little Help from another person taking care of personal grooming?: A Little Help from another person toileting, which includes using toliet, bedpan, or urinal?: A Little Help from another person bathing (including washing, rinsing, drying)?: A Little Help from another person to put on and taking off regular upper body clothing?: A Little Help from another person to put on and taking off regular lower body clothing?: A Little 6 Click Score: 18   End of Session Equipment Utilized During Treatment: Gait belt;Rolling walker Nurse Communication: Mobility status  Activity Tolerance: Patient tolerated treatment well Patient left: in chair;with call bell/phone within reach;with chair alarm set  OT Visit Diagnosis: Unsteadiness  on feet (R26.81);Muscle weakness (generalized) (M62.81)                Time: 6815-9470 OT Time Calculation (min): 35 min Charges:  OT General Charges $OT Visit: 1 Visit OT Evaluation $OT Eval Moderate Complexity: 1 Mod OT  Treatments $Self Care/Home Management : 8-22 mins  [name] Hail Harold Hedge) Marsa Aris OTR/L Acute Rehabilitation Services Pager: [phone] Office: [place] 08/13/2018, 4:52 PM
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
## 2018-08-13 NOTE — Progress Notes (Signed)
Inpatient Diabetes Program Recommendations  AACE/ADA: New Consensus Statement on Inpatient Glycemic Control (2015)  Target Ranges:  Prepandial:   less than 140 mg/dL      Peak postprandial:   less than 180 mg/dL (1-2 hours)      Critically ill patients:  140 - 180 mg/dL   Lab Results  Component Value Date   GLUCAP 244 (H) 08/13/2018   HGBA1C 8.2 (H) 08/12/2018    Review of Glycemic Control Results for JONH, MCQUEARY (MRN [id number]) as of 08/13/2018 12:33  Ref. Range 08/13/2018 06:01 08/13/2018 06:24 08/13/2018 06:33 08/13/2018 06:43 08/13/2018 11:27  Glucose-Capillary Latest Ref Range: 70 - 99 mg/dL 49 (L) 53 (L) 54 (L) 61 (L) 244 (H)   Diabetes history: Type 2 DM Outpatient Diabetes medications: Basaglar 14 units QHS Current orders for Inpatient glycemic control: Novolog 0-9 units TID, Lantus 8 units QHS  Inpatient Diabetes Program Recommendations:    Consider discontinuing Lantus as patient experienced an additional hypoglycemic event of 49 mg/dL this AM. Noted increase in creatinine, assuming this may be contributing.  Thanks, Bronson Curb, MSN, RNC-OB Diabetes Coordinator [phone] (8a-5p)

## 2018-08-14 DIAGNOSIS — N390 Urinary tract infection, site not specified: Secondary | ICD-10-CM | POA: Diagnosis present

## 2018-08-14 DIAGNOSIS — B952 Enterococcus as the cause of diseases classified elsewhere: Secondary | ICD-10-CM | POA: Diagnosis present

## 2018-08-14 DIAGNOSIS — F0390 Unspecified dementia without behavioral disturbance: Secondary | ICD-10-CM

## 2018-08-14 LAB — CBC WITH DIFFERENTIAL/PLATELET
Abs Immature Granulocytes: 0.02 10*3/uL (ref 0.00–0.07)
Basophils Absolute: 0 10*3/uL (ref 0.0–0.1)
Basophils Relative: 1 %
Eosinophils Absolute: 0.2 10*3/uL (ref 0.0–0.5)
Eosinophils Relative: 4 %
HCT: 27.4 % — ABNORMAL LOW (ref 39.0–52.0)
Hemoglobin: 8.4 g/dL — ABNORMAL LOW (ref 13.0–17.0)
Immature Granulocytes: 0 %
Lymphocytes Relative: 36 %
Lymphs Abs: 1.9 10*3/uL (ref 0.7–4.0)
MCH: 26.2 pg (ref 26.0–34.0)
MCHC: 30.7 g/dL (ref 30.0–36.0)
MCV: 85.4 fL (ref 80.0–100.0)
Monocytes Absolute: 0.6 10*3/uL (ref 0.1–1.0)
Monocytes Relative: 11 %
Neutro Abs: 2.4 10*3/uL (ref 1.7–7.7)
Neutrophils Relative %: 48 %
Platelets: 255 10*3/uL (ref 150–400)
RBC: 3.21 MIL/uL — ABNORMAL LOW (ref 4.22–5.81)
RDW: 15.2 % (ref 11.5–15.5)
WBC: 5.1 10*3/uL (ref 4.0–10.5)
nRBC: 0 % (ref 0.0–0.2)

## 2018-08-14 LAB — GLUCOSE, CAPILLARY
Glucose-Capillary: 124 mg/dL — ABNORMAL HIGH (ref 70–99)
Glucose-Capillary: 175 mg/dL — ABNORMAL HIGH (ref 70–99)
Glucose-Capillary: 143 mg/dL — ABNORMAL HIGH (ref 70–99)
Glucose-Capillary: 268 mg/dL — ABNORMAL HIGH (ref 70–99)

## 2018-08-14 LAB — RENAL FUNCTION PANEL
Albumin: 3.1 g/dL — ABNORMAL LOW (ref 3.5–5.0)
Anion gap: 9 (ref 5–15)
BUN: 18 mg/dL (ref 8–23)
CO2: 28 mmol/L (ref 22–32)
Calcium: 8.4 mg/dL — ABNORMAL LOW (ref 8.9–10.3)
Chloride: 103 mmol/L (ref 98–111)
Creatinine, Ser: 1.53 mg/dL — ABNORMAL HIGH (ref 0.61–1.24)
GFR calc Af Amer: 52 mL/min — ABNORMAL LOW (ref >60)
GFR calc non Af Amer: 45 mL/min — ABNORMAL LOW (ref >60)
Glucose, Bld: 152 mg/dL — ABNORMAL HIGH (ref 70–99)
Phosphorus: 3.2 mg/dL (ref 2.5–4.6)
Potassium: 3 mmol/L — ABNORMAL LOW (ref 3.5–5.1)
Sodium: 140 mmol/L (ref 135–145)

## 2018-08-14 LAB — MAGNESIUM: Magnesium: 1.5 mg/dL — ABNORMAL LOW (ref 1.7–2.4)

## 2018-08-14 MED ORDER — POTASSIUM CHLORIDE CRYS ER 20 MEQ PO TBCR
40.0000 meq | EXTENDED_RELEASE_TABLET | Freq: Two times a day (BID) | ORAL | Status: DC
  Administered 2018-08-14 – 2018-08-17 (×8): 40 meq via ORAL
  Filled 2018-08-14 (×8): qty 2

## 2018-08-14 NOTE — Progress Notes (Signed)
[name] Kitchen  PROGRESS NOTE    Harry [name]  DDU:[id number] DOB: 01-22-1946 DOA: 08/11/2018 PCP: [name], MD   Brief Narrative:   [name] a 73 y.o.malewith medical history significant ofCKD stage 3, prior strokes, DM2, PAD, orthostatic hypotension with supine hypertension. Work up unrevealing back in Jan this year. Patient currently on Pyridostigmine.  Patient presents to the ED with c/o 2 week history of dizziness, difficulty with walking and weakness. Symptoms worse when he stands up, better when he lays down.   Assessment & Plan:   Principal Problem:   Acute ischemic stroke [facility]) Active Problems:   Type 2 diabetes mellitus with stage 3 chronic kidney disease, with long-term current use of insulin (HCC)   Hyperlipidemia LDL goal <70   TOBACCO USE   Essential hypertension   Acute renal failure superimposed on stage 3 chronic kidney disease (HCC)   Orthostatic hypotension   UTI (urinary tract infection) due to Enterococcus   Dementia without behavioral disturbance (HCC)   Acute ischemic stroke - with dizziness     - the visual field cut is believed to be due to an old occipital stroke also seen on imaging.     - Looks like issues are predominately posterior circulation with the acute strokes here as well as the chronic infarcts.     - MRA head noted     - 2D Echo EF 50-55%, no m/t/v     - PT/OT: SNF recommendation     - SLP: no further needs     - Carotid dopplers: b/l 1-39% stenosis     - Neuro consult: continue ASA/plavix/statin, follow up with neuro outpt  AKI on CKD stage 3     - Creat at discharge in Jan was 1.5, looks like his baseline is 1.8-2 according to the DC summary at that time     - AM labs ordered  Orthostasis -     - Midodrine has caused urinary symptoms in past according to DC summary from Jan admission so isnt being used.     - Continue Pyridostigmine, looks like florinef stopped in Jan and pt switched to pyridostigmine?     - Prior  work up negative, current suspicion is dysautonomia     - IVF also didn't help back in Jan.  Supine hypertension -     - Don't want to worsen the orthostasis, and definitely dont want to add HTN meds in setting of acute ischemic stroke, so will leave this alone.     - If BP becomes too elevated, sit patient up in bed (will cause BP to drop).  E fecalis UTI      - Dose of rocephin given in ED     - UCx e fecalis     - consult pharmacy for ampicillin dosing  DM2     - Sensitive SSI AC     - glucose better this AM  HLD     - crestor 5mg  qHS  Dementia w/o behavioral disturbance     - family notes that he is being evaluated by neurology outpt for vascular dementia     - family notes that he is becoming more difficult to take care of at home; and are requesting help w/ placement; CM notified  Tobacco abuse     - nicotine patch     - counseled against further use  IV ampicillin started for e fecalis UTI. Transition to PO when sensitivities available. His orthostatics will be a  problem. Was previously on pyridostigmine; but hasn't been on it in some time. Will speak with daughter about this. Glucose better this AM. Continue as above.   DVT prophylaxis: lovenox Code Status: FULL Disposition Plan: TBD   Consultants:   Neurology  Antimicrobials:  . Ampicillin    Subjective: "What do you have for me today?"  Objective: Vitals:   08/13/18 1620 08/13/18 2023 08/14/18 0010 08/14/18 0415  BP: (!) 175/97 (!) 180/93 (!) 161/91 (!) 175/92  Pulse: 86 84 86 79  Resp: 16 18 12 15   Temp: 98.4 F (36.9 C) (!) 97.5 F (36.4 C) 98 F (36.7 C) (!) 97.4 F (36.3 C)  TempSrc: Oral Oral Oral Oral  SpO2: 100% 100% 100% 100%  Weight:      Height:        Intake/Output Summary (Last 24 hours) at 08/14/2018 0644 Last data filed at 08/14/2018 0300 Gross per 24 hour  Intake 320 ml  Output 1000 ml  Net -680 ml   Filed Weights   08/11/18 1939 08/12/18 0745  Weight: 79.4 kg 81.6 kg     Examination:  General: 73 y.o. male resting in bed in NAD Cardiovascular: RRR, +S1, S2, no m/g/r, equal pulses throughout Respiratory: CTABL, no w/r/r, normal WOB GI: BS+, NDNT, no masses noted, no organomegaly noted MSK: No e/c/c Skin: No rashes, bruises, ulcerations noted Neuro: A&O x 3, no focal deficits   Data Reviewed: I have personally reviewed following labs and imaging studies.  CBC: Recent Labs  Lab 08/11/18 2015 08/14/18 0435  WBC 5.0 5.1  NEUTROABS 2.7 2.4  HGB 8.4* 8.4*  HCT 26.7* 27.4*  MCV 86.4 85.4  PLT 268 638   Basic Metabolic Panel: Recent Labs  Lab 08/11/18 2015 08/14/18 0435  NA 140 140  K 3.0* 3.0*  CL 103 103  CO2 27 28  GLUCOSE 184* 152*  BUN 27* 18  CREATININE 2.37* 1.53*  CALCIUM 8.2* 8.4*  MG 2.0 1.5*  PHOS  --  3.2   GFR: Estimated Creatinine Clearance: 48.6 mL/min (A) (by C-G formula based on SCr of 1.53 mg/dL (H)). Liver Function Tests: Recent Labs  Lab 08/11/18 2015 08/14/18 0435  AST 16  --   ALT 13  --   ALKPHOS 69  --   BILITOT 0.6  --   PROT 6.8  --   ALBUMIN 3.4* 3.1*   Recent Labs  Lab 08/11/18 2015  LIPASE 65*   No results for input(s): AMMONIA in the last 168 hours. Coagulation Profile: No results for input(s): INR, PROTIME in the last 168 hours. Cardiac Enzymes: Recent Labs  Lab 08/11/18 2015  TROPONINI <0.03   BNP (last 3 results) No results for input(s): PROBNP in the last 8760 hours. HbA1C: Recent Labs    08/12/18 0322  HGBA1C 8.2*   CBG: Recent Labs  Lab 08/13/18 0643 08/13/18 1127 08/13/18 1706 08/13/18 2106 08/14/18 0607  GLUCAP 61* 244* 169* 170* 124*   Lipid Profile: Recent Labs    08/12/18 0322  CHOL 99  HDL 31*  LDLCALC 52  TRIG 81  CHOLHDL 3.2   Thyroid Function Tests: No results for input(s): TSH, T4TOTAL, FREET4, T3FREE, THYROIDAB in the last 72 hours. Anemia Panel: No results for input(s): VITAMINB12, FOLATE, FERRITIN, TIBC, IRON, RETICCTPCT in the last 72 hours.  Sepsis Labs: No results for input(s): PROCALCITON, LATICACIDVEN in the last 168 hours.  Recent Results (from the past 240 hour(s))  Urine culture     Status: Abnormal (Preliminary  result)   Collection Time: 08/11/18 11:42 PM   Specimen: Urine, Clean Catch  Result Value Ref Range Status   Specimen Description URINE, CLEAN CATCH  Final   Special Requests   Final    NONE Performed at [facility], [address] [place], [place] [address]    Culture >=100,000 COLONIES/mL ENTEROCOCCUS FAECALIS (A)  Final   Report Status PENDING  Incomplete  SARS Coronavirus 2 (CEPHEID - Performed in [facility] hospital lab), Hosp Order     Status: None   Collection Time: 08/12/18  3:10 AM   Specimen: Nasopharyngeal Swab  Result Value Ref Range Status   SARS Coronavirus 2 NEGATIVE NEGATIVE Final    Comment: (NOTE) If result is NEGATIVE SARS-CoV-2 target nucleic acids are NOT DETECTED. The SARS-CoV-2 RNA is generally detectable in upper and lower  respiratory specimens during the acute phase of infection. The lowest  concentration of SARS-CoV-2 viral copies this assay can detect is 250  copies / mL. A negative result does not preclude SARS-CoV-2 infection  and should not be used as the sole basis for treatment or other  patient management decisions.  A negative result may occur with  improper specimen collection / handling, submission of specimen other  than nasopharyngeal swab, presence of viral mutation(s) within the  areas targeted by this assay, and inadequate number of viral copies  (<250 copies / mL). A negative result must be combined with clinical  observations, patient history, and epidemiological information. If result is POSITIVE SARS-CoV-2 target nucleic acids are DETECTED. The SARS-CoV-2 RNA is generally detectable in upper and lower  respiratory specimens dur ing the acute phase of infection.  Positive  results are indicative of active infection with SARS-CoV-2.  Clinical   correlation with patient history and other diagnostic information is  necessary to determine patient infection status.  Positive results do  not rule out bacterial infection or co-infection with other viruses. If result is PRESUMPTIVE POSTIVE SARS-CoV-2 nucleic acids MAY BE PRESENT.   A presumptive positive result was obtained on the submitted specimen  and confirmed on repeat testing.  While 2019 novel coronavirus  (SARS-CoV-2) nucleic acids may be present in the submitted sample  additional confirmatory testing may be necessary for epidemiological  and / or clinical management purposes  to differentiate between  SARS-CoV-2 and other Sarbecovirus currently known to infect humans.  If clinically indicated additional testing with an alternate test  methodology [phone]) is advised. The SARS-CoV-2 RNA is generally  detectable in upper and lower respiratory sp ecimens during the acute  phase of infection. The expected result is Negative. Fact Sheet for Patients:  StrictlyIdeas.no Fact Sheet for Healthcare Providers: BankingDealers.co.za This test is not yet approved or cleared by the [place] FDA and has been authorized for detection and/or diagnosis of SARS-CoV-2 by FDA under an Emergency Use Authorization (EUA).  This EUA will remain in effect (meaning this test can be used) for the duration of the COVID-19 declaration under Section 564(b)(1) of the Act, 21 U.S.C. section 360bbb-3(b)(1), unless the authorization is terminated or revoked sooner. Performed at [facility], [place] [address], [place], [place] [address]          Radiology Studies: Vas US Carotid  Result Date: 08/12/2018 Carotid Arterial Duplex Study Indications:  CVA, Weakness and Dizziness. Risk Factors: Hypertension, hyperlipidemia, Diabetes, current smoker, prior CVA,               PAD. Limitations:  Difficult to image due  to snoring and head movement   Examination Guidelines: A complete evaluation includes B-mode imaging, spectral Doppler, color Doppler, and power Doppler as needed of all accessible portions of each vessel. Bilateral testing is considered an integral part of a complete examination. Limited examinations for reoccurring indications may be performed as noted.  Right Carotid Findings: +----------+--------+--------+--------+---------------------+------------------+           PSV cm/sEDV cm/sStenosisDescribe             Comments           +----------+--------+--------+--------+---------------------+------------------+ CCA Prox  62      8                                    mild intimal wall                                                         changes            +----------+--------+--------+--------+---------------------+------------------+ CCA Distal42      9                                    mild intimal wall                                                         changes            +----------+--------+--------+--------+---------------------+------------------+ ICA Prox  36      12      1-39%   heterogenous and     mild plaque on the                                   diffuse              far wall           +----------+--------+--------+--------+---------------------+------------------+ ICA Mid   42      16                                   tortuous           +----------+--------+--------+--------+---------------------+------------------+ ICA Distal44      15                                   tortuous           +----------+--------+--------+--------+---------------------+------------------+ ECA       60      1                                                       +----------+--------+--------+--------+---------------------+------------------+ +----------+--------+-------+--------+-------------------+  PSV cm/sEDV cmsDescribeArm Pressure (mmHG)  +----------+--------+-------+--------+-------------------+ [id number]                                         +----------+--------+-------+--------+-------------------+ +---------+--------+--+--------+--+---------+ VertebralPSV cm/s47EDV cm/s11Antegrade +---------+--------+--+--------+--+---------+ Technically difficult due to vocal interference - snoring, movement, high bifurcation, and tortuosity  Left Carotid Findings: +----------+--------+--------+--------+------------+-------------------------+           PSV cm/sEDV cm/sStenosisDescribe    Comments                  +----------+--------+--------+--------+------------+-------------------------+ CCA Prox  61      5                           mild intimal wall changes +----------+--------+--------+--------+------------+-------------------------+ CCA Distal53      15                          mild intimal wall changes +----------+--------+--------+--------+------------+-------------------------+ ICA Prox  55      17      1-39%   heterogenousmild plaque               +----------+--------+--------+--------+------------+-------------------------+ ICA Mid   39      13                          tortuous                  +----------+--------+--------+--------+------------+-------------------------+ ICA Distal46      22                          tortuous                  +----------+--------+--------+--------+------------+-------------------------+ ECA       59      10                          mild intimal wall changes +----------+--------+--------+--------+------------+-------------------------+ +----------+--------+--------+--------+-------------------+ SubclavianPSV cm/sEDV cm/sDescribeArm Pressure (mmHG) +----------+--------+--------+--------+-------------------+           99                                          +----------+--------+--------+--------+-------------------+  +---------+--------+--+--------+--+---------+ VertebralPSV cm/s68EDV cm/s16Antegrade +---------+--------+--+--------+--+---------+ Technically difficult due to vocal interference - snoring,movement, high bifurcation, and tortuosity  Summary: Right Carotid: Velocities in the right ICA are consistent with a 1-39% stenosis.                See technical notation listed above. Left Carotid: Velocities in the left ICA are consistent with a 1-39% stenosis.               See technical notation listed above. Vertebrals:  Bilateral vertebral arteries demonstrate antegrade flow. Subclavians: Normal flow hemodynamics were seen in bilateral subclavian              arteries. *See table(s) above for measurements and observations.  Electronically signed by Antony Contras MD on 08/12/2018 at 1:14:16 PM.    Final         Scheduled Meds: .  stroke: mapping our early stages of recovery book   Does not apply Once  . aspirin  EC  81 mg Oral Daily  . clopidogrel  75 mg Oral Daily  . enoxaparin (LOVENOX) injection  40 mg Subcutaneous Q24H  . insulin aspart  0-9 Units Subcutaneous TID WC  . nicotine  14 mg Transdermal Daily  . rosuvastatin  5 mg Oral Daily  . sertraline  25 mg Oral Daily   Continuous Infusions: . ampicillin (OMNIPEN) IV 1 g (08/14/18 0520)     LOS: 2 days    Time spent: 25 minutes spent in the coordination of care today.    Jonnie Finner, DO [facility] Pager [phone]  If 7PM-7AM, please contact night-coverage [email or web address] Password [facility] 08/14/2018, 6:44 AM
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
## 2018-08-14 NOTE — Progress Notes (Signed)
Occupational Therapy Treatment Patient Details Name: Harry [name] MRN: [id number] DOB: [birth year] Today's Date: 08/14/2018    History of present illness Pt is a 73 y/o male admitted secondary to increased dizziness and weakness. MRI revealed L PCA territory infarct. PMH includes DM, CKD, HTN, dementia, CAD s/p stent, and CVA.    OT comments  Pt performing bed mobility with supervisionA; transfers with minguardA and ambulation in room with RW with minA for avoiding obstacles. Pt performing grooming at sink with minguardA and leaning on elbows for energy conservation, but refusing to sit down for tasks. Pt modA for toilet hygiene at this time and pt unaware of bowel movements. Pt's daughter unable to care for pt at home with new deficits. Pt continues to have difficulty with vision especially depth perception and looking inferiorly. Pt would benefit from continued OT skilled services for ADL, mobility and safety in SNF setting. OT to follow acutely for visual perception and energy conservation.    Follow Up Recommendations  SNF;Supervision/Assistance - 24 hour(daughter unable to provide care for pt)    Equipment Recommendations  Other (comment)(defer to next facility)    Recommendations for Other Services      Precautions / Restrictions Precautions Precautions: Fall Restrictions Weight Bearing Restrictions: No       Mobility Bed Mobility Overal bed mobility: Needs Assistance Bed Mobility: Supine to Sit;Sit to Supine     Supine to sit: Supervision     General bed mobility comments: Supervision for safety. Increased time required.   Transfers Overall transfer level: Needs assistance Equipment used: Rolling walker (2 wheeled) Transfers: Sit to/from Stand Sit to Stand: Min guard         General transfer comment: Min guard for steadying assist. Cues for safe hand placement.     Balance Overall balance assessment: Needs assistance Sitting-balance support: No upper  extremity supported;Feet supported Sitting balance-Leahy Scale: Fair     Standing balance support: Bilateral upper extremity supported;During functional activity Standing balance-Leahy Scale: Poor Standing balance comment: Reliant on BUE suport                            ADL either performed or assessed with clinical judgement   ADL Overall ADL's : Needs assistance/impaired     Grooming: Wash/dry hands;Wash/dry face;Oral care;Min guard;Standing Grooming Details (indicate cue type and reason): leaning on elbows at sink, stating that he could not stand upright for task                 Toilet Transfer: Minimal assistance;RW;Comfort height Brewing technologist- Clothing Manipulation and Hygiene: Moderate assistance;Sitting/lateral lean;Sit to/from stand Toileting - Clothing Manipulation Details (indicate cue type and reason): Pt with a mess in the bed and unable to properly wipe self without modA.     Functional mobility during ADLs: Minimal assistance;Rolling walker;Cueing for sequencing General ADL Comments: Pt requires cues to avoid bumping into objects- pt's depth perception is poor and keeps RW out front where he can see it rather than close to him regardless of multimodal cues. Pt performing light ADL tasks standing at sink and pt unable to tolerate standing upright so pt leaned on elbows;      Vision   Vision Assessment?: Yes Eye Alignment: Within Functional Limits Tracking/Visual Pursuits: Decreased smoothness of eye movement to RIGHT inferior field;Decreased smoothness of eye movement to LEFT inferior field Saccades: Within functional limits Convergence: Impaired - to be further tested in functional context  Additional Comments: Pt with R visual field cut in lower quadrant; b/l eyes are affected. (L eye at baseline)   Perception     Praxis      Cognition Arousal/Alertness: Awake/alert Behavior During Therapy: WFL for tasks  assessed/performed Overall Cognitive Status: History of cognitive impairments - at baseline                                          Exercises     Shoulder Instructions       General Comments BP in sitting after reported dizziness once in recliner to conclude session-106/65, O2 was 100% on RA.    Pertinent Vitals/ Pain       Pain Assessment: No/denies pain  Home Living                                          Prior Functioning/Environment              Frequency  Min 2X/week        Progress Toward Goals  OT Goals(current goals can now be found in the care plan section)  Progress towards OT goals: Progressing toward goals  Acute Rehab OT Goals Patient Stated Goal: to go home OT Goal Formulation: With patient Time For Goal Achievement: 08/27/18 Potential to Achieve Goals: Good ADL Goals Pt Will Perform Grooming: with modified independence;standing Pt Will Perform Lower Body Dressing: with modified independence;sit to/from stand Pt Will Perform Toileting - Clothing Manipulation and hygiene: with modified independence;sit to/from stand Additional ADL Goal #1: Pt S with OOB ADL with fair balance Additional ADL Goal #2: Pt will compensate with visual deficits with visual scanning and head turning to avoid obstacles with minimal cues.  Plan Discharge plan needs to be updated    Co-evaluation                 AM-PAC OT "6 Clicks" Daily Activity     Outcome Measure   Help from another person eating meals?: None Help from another person taking care of personal grooming?: A Little Help from another person toileting, which includes using toliet, bedpan, or urinal?: A Little Help from another person bathing (including washing, rinsing, drying)?: A Little Help from another person to put on and taking off regular upper body clothing?: A Little Help from another person to put on and taking off regular lower body clothing?: A  Little 6 Click Score: 19    End of Session Equipment Utilized During Treatment: Gait belt;Rolling walker  OT Visit Diagnosis: Unsteadiness on feet (R26.81);Muscle weakness (generalized) (M62.81)   Activity Tolerance Patient tolerated treatment well   Patient Left in chair;with call bell/phone within reach;with chair alarm set   Nurse Communication Mobility status        Time: 1027-2536 OT Time Calculation (min): 29 min  Charges: OT General Charges $OT Visit: 1 Visit OT Treatments $Self Care/Home Management : 8-22 mins $Neuromuscular Re-education: 8-22 mins  Darryl Nestle) Marsa Aris OTR/L Acute Rehabilitation Services Pager: [phone] Office: [phone]    Jenene Slicker Loden Laurent 08/14/2018, 4:04 PM

## 2018-08-14 NOTE — Progress Notes (Signed)
Physical Therapy Treatment Patient Details Name: Harry [name] MRN: [id number] DOB: 30-May-1945 Today's Date: 08/14/2018    History of Present Illness Pt is a 73 y/o male admitted secondary to increased dizziness and weakness. MRI revealed L PCA territory infarct. PMH includes DM, CKD, HTN, dementia, CAD s/p stent, and CVA.     PT Comments    Patient seen for mobility progression. Pt requires min guard/min A for functional transfers and gait with use of AD. Pt tolerated short distance gait and limited by c/o "wooziness" while in standing. Continue to progress as tolerated.    Follow Up Recommendations  Supervision/Assistance - 24 hour;SNF     Equipment Recommendations  Rolling walker with 5" wheels    Recommendations for Other Services       Precautions / Restrictions Precautions Precautions: Fall Restrictions Weight Bearing Restrictions: No    Mobility  Bed Mobility Overal bed mobility: Needs Assistance Bed Mobility: Sit to Supine     Supine to sit: Supervision Sit to supine: Supervision   General bed mobility comments: Supervision for safety. Increased time required.   Transfers Overall transfer level: Needs assistance Equipment used: Rolling walker (2 wheeled) Transfers: Sit to/from Stand Sit to Stand: Min assist         General transfer comment: assist to steady and stabilize RW from recliner; cues for safe hand placement  Ambulation/Gait Ambulation/Gait assistance: Min assist;Min guard Gait Distance (Feet): 30 Feet Assistive device: Rolling walker (2 wheeled) Gait Pattern/deviations: Step-through pattern;Decreased stride length;Trunk flexed Gait velocity: Decreased    General Gait Details: limited distance due to c/o "wooziness"; multimodal cues for safe use of AD as pt tends to keep RW too far away especially when turning; assistance to steady and to guide RW at times   Stairs             Wheelchair Mobility    Modified Rankin (Stroke  Patients Only)       Balance Overall balance assessment: Needs assistance Sitting-balance support: No upper extremity supported;Feet supported Sitting balance-Leahy Scale: Fair     Standing balance support: Bilateral upper extremity supported;During functional activity Standing balance-Leahy Scale: Poor Standing balance comment: Reliant on BUE suport                             Cognition Arousal/Alertness: Awake/alert Behavior During Therapy: WFL for tasks assessed/performed Overall Cognitive Status: History of cognitive impairments - at baseline                                        Exercises      General Comments General comments (skin integrity, edema, etc.): BP in sitting after reported dizziness once in recliner to conclude session-106/65, O2 was 100% on RA.      Pertinent Vitals/Pain Pain Assessment: No/denies pain    Home Living                      Prior Function            PT Goals (current goals can now be found in the care plan section) Acute Rehab PT Goals Patient Stated Goal: to go home Progress towards PT goals: Progressing toward goals    Frequency    Min 4X/week      PT Plan Current plan remains appropriate    Co-evaluation  AM-PAC PT "6 Clicks" Mobility   Outcome Measure  Help needed turning from your back to your side while in a flat bed without using bedrails?: None Help needed moving from lying on your back to sitting on the side of a flat bed without using bedrails?: A Little Help needed moving to and from a bed to a chair (including a wheelchair)?: A Little Help needed standing up from a chair using your arms (e.g., wheelchair or bedside chair)?: A Little Help needed to walk in hospital room?: A Little Help needed climbing 3-5 steps with a railing? : A Lot 6 Click Score: 18    End of Session Equipment Utilized During Treatment: Gait belt Activity Tolerance: Patient  tolerated treatment well Patient left: in bed;with call bell/phone within reach;with bed alarm set Nurse Communication: Mobility status PT Visit Diagnosis: Unsteadiness on feet (R26.81);Muscle weakness (generalized) (M62.81)     Time: 1539-1600 PT Time Calculation (min) (ACUTE ONLY): 21 min  Charges:  $Gait Training: 8-22 mins                     Earney Navy, PTA Acute Rehabilitation Services Pager: [phone] Office: [phone]     Darliss Cheney 08/14/2018, 4:17 PM
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
## 2018-08-14 NOTE — Consult Note (Signed)
   [facility] CM Inpatient Consult   08/14/2018  Harry [name] 1945-10-31 [id number]    Patient was assessed for [place] Management need for services. Patient was previously engaged by [facility] Management coordinators in the past.   Patient is currently with a high risk score of 27% for unplanned readmission, having 3 hospitalizations and 2 ED visits in the past 6 months in the Medicare/ [facility] ACO.  The following information is from MD narrative dated 08/14/2018, states as follows: Harry L Brownis a 73 y.o.malewith medical history significant ofCKD stage 3, prior strokes, DM2, PAD, orthostatic hypotension with supine hypertension. Work up unrevealing back in Jan this year. Patient currently on Pyridostigmine. Patient presents to the ED with c/o 2 week history of dizziness, difficulty with walking and weakness. Symptoms worse when he stands up, better when he lays down.  (Acute ischemic stroke - with dizziness, UTI, orthostatic hypotension, DM II, dementia)  Primary care provider is Dr. [name]. Per MD note today, family noted that patient is becoming more difficult to take care of at home; and are requesting help w/ placement; CM notified.  Called and spoke with transition of care CM who confirmed that patient will likely be discharging to SNF [facility]) since daughter is having difficulty taking care of patient at home.  [facility] post acute RN coordinator will be made aware of patient's disposition for any [facility] Care management post discharge follow up needs.     Of note, [facility] Care Management services does not replace or interfere with any services that are arranged by inpatient TOC case management or social work.    For additional questions or referrals please contact:  Edwena Felty A. Janice Bodine, BSN, RN-BC [facility] Liaison Cell: [phone]

## 2018-08-15 LAB — GLUCOSE, CAPILLARY
Glucose-Capillary: 200 mg/dL — ABNORMAL HIGH (ref 70–99)
Glucose-Capillary: 170 mg/dL — ABNORMAL HIGH (ref 70–99)
Glucose-Capillary: 150 mg/dL — ABNORMAL HIGH (ref 70–99)
Glucose-Capillary: 239 mg/dL — ABNORMAL HIGH (ref 70–99)

## 2018-08-15 LAB — RENAL FUNCTION PANEL
Albumin: 3.2 g/dL — ABNORMAL LOW (ref 3.5–5.0)
Anion gap: 10 (ref 5–15)
BUN: 18 mg/dL (ref 8–23)
CO2: 25 mmol/L (ref 22–32)
Calcium: 8.2 mg/dL — ABNORMAL LOW (ref 8.9–10.3)
Chloride: 105 mmol/L (ref 98–111)
Creatinine, Ser: 1.44 mg/dL — ABNORMAL HIGH (ref 0.61–1.24)
GFR calc Af Amer: 56 mL/min — ABNORMAL LOW (ref >60)
GFR calc non Af Amer: 49 mL/min — ABNORMAL LOW (ref >60)
Glucose, Bld: 162 mg/dL — ABNORMAL HIGH (ref 70–99)
Phosphorus: 2.3 mg/dL — ABNORMAL LOW (ref 2.5–4.6)
Potassium: 3.5 mmol/L (ref 3.5–5.1)
Sodium: 140 mmol/L (ref 135–145)

## 2018-08-15 MED ORDER — FLUDROCORTISONE ACETATE 0.1 MG PO TABS
0.0500 mg | ORAL_TABLET | Freq: Every day | ORAL | Status: DC
  Filled 2018-08-15: qty 0.5

## 2018-08-15 MED ORDER — SODIUM CHLORIDE 0.9 % IV SOLN
INTRAVENOUS | Status: DC | PRN
  Administered 2018-08-15 – 2018-08-16 (×2): 250 mL via INTRAVENOUS

## 2018-08-15 NOTE — Progress Notes (Signed)
Nurse place Abdominal binder and ted hose on pt. Around 4pm Tolerated well. Based on Orthostatic VS done around 5pm. BP improved.

## 2018-08-15 NOTE — Progress Notes (Signed)
[name] Kitchen  PROGRESS NOTE    Harry [name]  EXH:[id number] DOB: 03-Jun-1945 DOA: 08/11/2018 PCP: [name], MD   Brief Narrative:   Harry [name] a 73 y.o.malewith medical history significant ofCKD stage 3, prior strokes, DM2, PAD, orthostatic hypotension with supine hypertension. Work up unrevealing back in Jan this year. Patient currently on Pyridostigmine.  Patient presents to the ED with c/o 2 week history of dizziness, difficulty with walking and weakness. Symptoms worse when he stands up, better when he lays down.   Assessment & Plan:   Principal Problem:   Acute ischemic stroke [facility]) Active Problems:   Type 2 diabetes mellitus with stage 3 chronic kidney disease, with long-term current use of insulin (HCC)   Hyperlipidemia LDL goal <70   TOBACCO USE   Essential hypertension   Acute renal failure superimposed on stage 3 chronic kidney disease (HCC)   Orthostatic hypotension   UTI (urinary tract infection) due to Enterococcus   Dementia without behavioral disturbance (HCC)   Acute ischemic stroke - with dizziness - the visual field cut is believed to be due to an old occipital stroke also seen on imaging. - Looks like issues are predominately posterior circulation with the acute strokes here as well as the chronic infarcts. - MRA head noted - 2D Echo EF 50-55%, no m/t/v - PT/OT: SNF recommendation - SLP: no further needs - Carotid dopplers: b/l 1-39% stenosis - Neuro consult: continue ASA/plavix/statin, follow up with neuro outpt  AKI on CKD stage 3 - Creat at discharge in Jan was 1.5, looks like his baseline is 1.8-2 according to the DC summary at that time - AM labs ordered  Dysautonomia/Orthostatic hypotension/supine hypertension - - Midodrine has caused urinary symptoms in past according to DC summary from Jan admission so isnt being used. - Prior work up negative, current suspicion is dysautonomia -  IVF also didn't help back in Jan. - Don't want to worsen the orthostasis, and definitely dont want to add HTN meds in setting of acute ischemic stroke, so will leave this alone. - If BP becomes too elevated, sit patient up in bed (will cause BP to drop).     - per daughter: midodrine causes severe urinary retention for him, florinef and pyridostigmine have caused his BP to skyrocket; difficult situation     - let's try TED hose and an abdominal binder and check orthostatics with those; will need to use bedside commode or urinal for relieving himself for now   E fecalis UTI  - Dose of rocephin given in ED - UCx e fecalis - consult pharmacy for ampicillin dosing     - awaiting sensitivities  DM2 - Sensitive SSI AC     - acceptable this AM  HLD     - crestor 5mg  qHS  Dementia w/o behavioral disturbance     - family notes that he is being evaluated by neurology outpt for vascular dementia     - family notes that he is becoming more difficult to take care of at home; and are requesting help w/ placement; CM notified  Tobacco abuse     - nicotine patch     - counseled against further use   DVT prophylaxis: lovenox Code Status: FULL Family Communication: Spoke with dtr by phone   Disposition Plan: To SNF when stable   Consultants:   Neurology   Antimicrobials:  . Ampicillin    Subjective: "How about today? Am I going?"  Objective: Vitals:   08/14/18 2354  08/15/18 0338 08/15/18 0804 08/15/18 1140  BP: (!) 148/81 (!) 175/95 (!) 163/84 (!) 162/88  Pulse: 94 91 86 85  Resp: 18 14 (!) 24 (!) 24  Temp: (!) 97.5 F (36.4 C) 97.7 F (36.5 C) 98 F (36.7 C) (!) 97.5 F (36.4 C)  TempSrc: Oral Oral Oral Oral  SpO2: 100% 96% 100% 100%  Weight:      Height:        Intake/Output Summary (Last 24 hours) at 08/15/2018 1316 Last data filed at 08/15/2018 0848 Gross per 24 hour  Intake 1171.78 ml  Output 2600 ml  Net -1428.22 ml   Filed Weights    08/11/18 1939 08/12/18 0745  Weight: 79.4 kg 81.6 kg    Examination:  General:73 y.o.maleresting in bed in NAD Cardiovascular: RRR, +S1, S2, no m/g/r, equal pulses throughout Respiratory: CTABL, no w/r/r, normal WOB GI: BS+, NDNT, no masses noted, no organomegaly noted MSK: No e/c/c Skin: No rashes, bruises, ulcerations noted Neuro: A&O x 3, no focal deficits    Data Reviewed: I have personally reviewed following labs and imaging studies.  CBC: Recent Labs  Lab 08/11/18 2015 08/14/18 0435  WBC 5.0 5.1  NEUTROABS 2.7 2.4  HGB 8.4* 8.4*  HCT 26.7* 27.4*  MCV 86.4 85.4  PLT 268 161   Basic Metabolic Panel: Recent Labs  Lab 08/11/18 2015 08/14/18 0435 08/15/18 0820  NA 140 140 140  K 3.0* 3.0* 3.5  CL 103 103 105  CO2 27 28 25   GLUCOSE 184* 152* 162*  BUN 27* 18 18  CREATININE 2.37* 1.53* 1.44*  CALCIUM 8.2* 8.4* 8.2*  MG 2.0 1.5*  --   PHOS  --  3.2 2.3*   GFR: Estimated Creatinine Clearance: 51.6 mL/min (A) (by C-G formula based on SCr of 1.44 mg/dL (H)). Liver Function Tests: Recent Labs  Lab 08/11/18 2015 08/14/18 0435 08/15/18 0820  AST 16  --   --   ALT 13  --   --   ALKPHOS 69  --   --   BILITOT 0.6  --   --   PROT 6.8  --   --   ALBUMIN 3.4* 3.1* 3.2*   Recent Labs  Lab 08/11/18 2015  LIPASE 65*   No results for input(s): AMMONIA in the last 168 hours. Coagulation Profile: No results for input(s): INR, PROTIME in the last 168 hours. Cardiac Enzymes: Recent Labs  Lab 08/11/18 2015  TROPONINI <0.03   BNP (last 3 results) No results for input(s): PROBNP in the last 8760 hours. HbA1C: No results for input(s): HGBA1C in the last 72 hours. CBG: Recent Labs  Lab 08/14/18 1104 08/14/18 1618 08/14/18 2125 08/15/18 0605 08/15/18 1137  GLUCAP 175* 143* 268* 200* 170*   Lipid Profile: No results for input(s): CHOL, HDL, LDLCALC, TRIG, CHOLHDL, LDLDIRECT in the last 72 hours. Thyroid Function Tests: No results for input(s): TSH,  T4TOTAL, FREET4, T3FREE, THYROIDAB in the last 72 hours. Anemia Panel: No results for input(s): VITAMINB12, FOLATE, FERRITIN, TIBC, IRON, RETICCTPCT in the last 72 hours. Sepsis Labs: No results for input(s): PROCALCITON, LATICACIDVEN in the last 168 hours.  Recent Results (from the past 240 hour(s))  Urine culture     Status: Abnormal (Preliminary result)   Collection Time: 08/11/18 11:42 PM   Specimen: Urine, Clean Catch  Result Value Ref Range Status   Specimen Description URINE, CLEAN CATCH  Final   Special Requests NONE  Final   Culture (A)  Final    >=  100,000 COLONIES/mL ENTEROCOCCUS FAECALIS Sent to [facility] for further susceptibility testing. Performed at [facility], [place] [address] [place], [place] [address]    Report Status PENDING  Incomplete  SARS Coronavirus 2 (CEPHEID - Performed in [place] hospital lab), Hosp Order     Status: None   Collection Time: 08/12/18  3:10 AM   Specimen: Nasopharyngeal Swab  Result Value Ref Range Status   SARS Coronavirus 2 NEGATIVE NEGATIVE Final    Comment: (NOTE) If result is NEGATIVE SARS-CoV-2 target nucleic acids are NOT DETECTED. The SARS-CoV-2 RNA is generally detectable in upper and lower  respiratory specimens during the acute phase of infection. The lowest  concentration of SARS-CoV-2 viral copies this assay can detect is 250  copies / mL. A negative result does not preclude SARS-CoV-2 infection  and should not be used as the sole basis for treatment or other  patient management decisions.  A negative result may occur with  improper specimen collection / handling, submission of specimen other  than nasopharyngeal swab, presence of viral mutation(s) within the  areas targeted by this assay, and inadequate number of viral copies  (<250 copies / mL). A negative result must be combined with clinical  observations, patient history, and epidemiological information. If result is POSITIVE SARS-CoV-2 target nucleic acids  are DETECTED. The SARS-CoV-2 RNA is generally detectable in upper and lower  respiratory specimens dur ing the acute phase of infection.  Positive  results are indicative of active infection with SARS-CoV-2.  Clinical  correlation with patient history and other diagnostic information is  necessary to determine patient infection status.  Positive results do  not rule out bacterial infection or co-infection with other viruses. If result is PRESUMPTIVE POSTIVE SARS-CoV-2 nucleic acids MAY BE PRESENT.   A presumptive positive result was obtained on the submitted specimen  and confirmed on repeat testing.  While 2019 novel coronavirus  (SARS-CoV-2) nucleic acids may be present in the submitted sample  additional confirmatory testing may be necessary for epidemiological  and / or clinical management purposes  to differentiate between  SARS-CoV-2 and other Sarbecovirus currently known to infect humans.  If clinically indicated additional testing with an alternate test  methodology [phone]) is advised. The SARS-CoV-2 RNA is generally  detectable in upper and lower respiratory sp ecimens during the acute  phase of infection. The expected result is Negative. Fact Sheet for Patients:  StrictlyIdeas.no Fact Sheet for Healthcare Providers: BankingDealers.co.za This test is not yet approved or cleared by the [place] FDA and has been authorized for detection and/or diagnosis of SARS-CoV-2 by FDA under an Emergency Use Authorization (EUA).  This EUA will remain in effect (meaning this test can be used) for the duration of the COVID-19 declaration under Section 564(b)(1) of the Act, 21 U.S.C. section 360bbb-3(b)(1), unless the authorization is terminated or revoked sooner. Performed at [facility], [place] [address], [place], [place] [address]          Radiology Studies: No results found.      Scheduled Meds: .  stroke: mapping  our early stages of recovery book   Does not apply Once  . aspirin EC  81 mg Oral Daily  . clopidogrel  75 mg Oral Daily  . enoxaparin (LOVENOX) injection  40 mg Subcutaneous Q24H  . insulin aspart  0-9 Units Subcutaneous TID WC  . nicotine  14 mg Transdermal Daily  . potassium chloride  40 mEq Oral BID  . rosuvastatin  5  mg Oral Daily  . sertraline  25 mg Oral Daily   Continuous Infusions: . sodium chloride 10 mL/hr at 08/15/18 0405  . ampicillin (OMNIPEN) IV 1 g (08/15/18 1309)     LOS: 3 days    Time spent: 35 minutes spent in the coordination of care today.     Jonnie Finner, DO [facility] Pager [phone]  If 7PM-7AM, please contact night-coverage [email or web address] Password [email or web address] 08/15/2018, 1:16 PM

## 2018-08-15 NOTE — Progress Notes (Signed)
Physical Therapy Treatment Patient Details Name: Harry [name] MRN: [id number] DOB: Feb 14, 1946 Today's Date: 08/15/2018    History of Present Illness Pt is a 73 y/o male admitted secondary to increased dizziness and weakness. MRI revealed L PCA territory infarct. PMH includes DM, CKD, HTN, dementia, CAD s/p stent, and CVA.     PT Comments    Patient seen for mobility progression. Pt requires min/mod A for functional transfer/gait training with RW. Pt limited by dizziness and with orthostatic BP from fitting to standing-see general comments below. Continue to progress as tolerated with anticipated d/c to SNF for further skilled PT services.     Follow Up Recommendations  Supervision/Assistance - 24 hour;SNF     Equipment Recommendations  Rolling walker with 5" wheels    Recommendations for Other Services       Precautions / Restrictions Precautions Precautions: Fall Restrictions Weight Bearing Restrictions: No    Mobility  Bed Mobility Overal bed mobility: Needs Assistance Bed Mobility: Supine to Sit     Supine to sit: Supervision     General bed mobility comments: Supervision for safety  Transfers Overall transfer level: Needs assistance Equipment used: Rolling walker (2 wheeled) Transfers: Sit to/from Stand Sit to Stand: Min assist         General transfer comment: assist to power up into standing and cues for safe hand placement  Ambulation/Gait Ambulation/Gait assistance: Min assist Gait Distance (Feet): 70 Feet Assistive device: Rolling walker (2 wheeled) Gait Pattern/deviations: Step-through pattern;Trunk flexed;Decreased step length - right;Decreased stride length;Decreased dorsiflexion - right;Decreased dorsiflexion - left;Drifts right/left Gait velocity: Decreased    General Gait Details: multimodal cues for posture and assistance for balance and for safe use of AD; with fatigue pt with increased turnk flexion and difficulty advancing R  LE   Stairs             Wheelchair Mobility    Modified Rankin (Stroke Patients Only) Modified Rankin (Stroke Patients Only) Pre-Morbid Rankin Score: Moderate disability Modified Rankin: Moderately severe disability     Balance Overall balance assessment: Needs assistance Sitting-balance support: No upper extremity supported;Feet supported Sitting balance-Leahy Scale: Fair     Standing balance support: Bilateral upper extremity supported;During functional activity Standing balance-Leahy Scale: Poor Standing balance comment: Reliant on BUE suport                             Cognition Arousal/Alertness: Awake/alert Behavior During Therapy: WFL for tasks assessed/performed Overall Cognitive Status: History of cognitive impairments - at baseline                                        Exercises      General Comments General comments (skin integrity, edema, etc.): BP in sitting 114/78 (92) and in standing 77/52 (59) If accurate-pt moving UE; assisted RN with placing abdominal binder and TED hose end of session      Pertinent Vitals/Pain Pain Assessment: No/denies pain    Home Living                      Prior Function            PT Goals (current goals can now be found in the care plan section) Acute Rehab PT Goals Patient Stated Goal: to go home Progress towards PT goals: Progressing toward goals  Frequency    Min 4X/week      PT Plan Current plan remains appropriate    Co-evaluation              AM-PAC PT "6 Clicks" Mobility   Outcome Measure  Help needed turning from your back to your side while in a flat bed without using bedrails?: None Help needed moving from lying on your back to sitting on the side of a flat bed without using bedrails?: A Little Help needed moving to and from a bed to a chair (including a wheelchair)?: A Little Help needed standing up from a chair using your arms (e.g.,  wheelchair or bedside chair)?: A Little Help needed to walk in hospital room?: A Lot Help needed climbing 3-5 steps with a railing? : A Lot 6 Click Score: 17    End of Session Equipment Utilized During Treatment: Gait belt Activity Tolerance: Patient tolerated treatment well Patient left: with call bell/phone within reach;in chair;with chair alarm set;with nursing/sitter in room Nurse Communication: Mobility status PT Visit Diagnosis: Unsteadiness on feet (R26.81);Muscle weakness (generalized) (M62.81)     Time: 0865-7846 PT Time Calculation (min) (ACUTE ONLY): 29 min  Charges:  $Gait Training: 8-22 mins $Therapeutic Activity: 8-22 mins                     Earney Navy, PTA Acute Rehabilitation Services Pager: [phone] Office: [phone]     Darliss Cheney 08/15/2018, 4:17 PM

## 2018-08-15 NOTE — Care Management Important Message (Signed)
Important Message  Patient Details  Name: Harry [name] MRN: [id number] Date of Birth: 1945/08/25   Medicare Important Message Given:  Yes    Orbie Pyo 08/15/2018, 2:55 PM

## 2018-08-16 LAB — RENAL FUNCTION PANEL
Albumin: 3.4 g/dL — ABNORMAL LOW (ref 3.5–5.0)
Anion gap: 8 (ref 5–15)
BUN: 18 mg/dL (ref 8–23)
CO2: 23 mmol/L (ref 22–32)
Calcium: 8.6 mg/dL — ABNORMAL LOW (ref 8.9–10.3)
Chloride: 106 mmol/L (ref 98–111)
Creatinine, Ser: 1.61 mg/dL — ABNORMAL HIGH (ref 0.61–1.24)
GFR calc Af Amer: 49 mL/min — ABNORMAL LOW (ref >60)
GFR calc non Af Amer: 42 mL/min — ABNORMAL LOW (ref >60)
Glucose, Bld: 216 mg/dL — ABNORMAL HIGH (ref 70–99)
Phosphorus: 2.4 mg/dL — ABNORMAL LOW (ref 2.5–4.6)
Potassium: 4.5 mmol/L (ref 3.5–5.1)
Sodium: 137 mmol/L (ref 135–145)

## 2018-08-16 LAB — GLUCOSE, CAPILLARY
Glucose-Capillary: 183 mg/dL — ABNORMAL HIGH (ref 70–99)
Glucose-Capillary: 225 mg/dL — ABNORMAL HIGH (ref 70–99)
Glucose-Capillary: 195 mg/dL — ABNORMAL HIGH (ref 70–99)
Glucose-Capillary: 194 mg/dL — ABNORMAL HIGH (ref 70–99)

## 2018-08-16 LAB — CBC WITH DIFFERENTIAL/PLATELET
Abs Immature Granulocytes: 0.02 10*3/uL (ref 0.00–0.07)
Basophils Absolute: 0 10*3/uL (ref 0.0–0.1)
Basophils Relative: 1 %
Eosinophils Absolute: 0.2 10*3/uL (ref 0.0–0.5)
Eosinophils Relative: 4 %
HCT: 28.8 % — ABNORMAL LOW (ref 39.0–52.0)
Hemoglobin: 8.8 g/dL — ABNORMAL LOW (ref 13.0–17.0)
Immature Granulocytes: 0 %
Lymphocytes Relative: 30 %
Lymphs Abs: 1.4 10*3/uL (ref 0.7–4.0)
MCH: 26.6 pg (ref 26.0–34.0)
MCHC: 30.6 g/dL (ref 30.0–36.0)
MCV: 87 fL (ref 80.0–100.0)
Monocytes Absolute: 0.6 10*3/uL (ref 0.1–1.0)
Monocytes Relative: 14 %
Neutro Abs: 2.3 10*3/uL (ref 1.7–7.7)
Neutrophils Relative %: 51 %
Platelets: 255 10*3/uL (ref 150–400)
RBC: 3.31 MIL/uL — ABNORMAL LOW (ref 4.22–5.81)
RDW: 15.8 % — ABNORMAL HIGH (ref 11.5–15.5)
WBC: 4.6 10*3/uL (ref 4.0–10.5)
nRBC: 0 % (ref 0.0–0.2)

## 2018-08-16 LAB — MAGNESIUM: Magnesium: 1.6 mg/dL — ABNORMAL LOW (ref 1.7–2.4)

## 2018-08-16 MED ORDER — NICOTINE POLACRILEX 2 MG MT GUM
2.0000 mg | CHEWING_GUM | OROMUCOSAL | Status: DC | PRN
  Administered 2018-08-16 – 2018-08-18 (×3): 2 mg via ORAL
  Filled 2018-08-16 (×4): qty 1

## 2018-08-16 NOTE — Progress Notes (Signed)
Harry [name]  PROGRESS NOTE    Harry [name]  JQZ:[id number] DOB: Apr 22, 1945 DOA: 08/11/2018 PCP: [name], MD   Brief Narrative:   [name] a 73 y.o.malewith medical history significant ofCKD stage 3, prior strokes, DM2, PAD, orthostatic hypotension with supine hypertension. Work up unrevealing back in Jan this year. Patient currently on Pyridostigmine.  Patient presents to the ED with c/o 2 week history of dizziness, difficulty with walking and weakness. Symptoms worse when he stands up, better when he lays down.   Assessment & Plan:   Principal Problem:   Acute ischemic stroke [facility]) Active Problems:   Type 2 diabetes mellitus with stage 3 chronic kidney disease, with long-term current use of insulin (HCC)   Hyperlipidemia LDL goal <70   TOBACCO USE   Essential hypertension   Acute renal failure superimposed on stage 3 chronic kidney disease (HCC)   Orthostatic hypotension   UTI (urinary tract infection) due to Enterococcus   Dementia without behavioral disturbance (HCC)   Acute ischemic stroke - with dizziness - the visual field cut is believed to be due to an old occipital stroke also seen on imaging. - Looks like issues are predominately posterior circulation with the acute strokes here as well as the chronic infarcts. - MRA head noted - 2D Echo EF 50-55%, no m/t/v - PT/OT:SNF recommendation - SLP: no further needs - Carotid dopplers: b/l 1-39% stenosis - Neuro consult: continue ASA/plavix/statin, follow up with neuro outpt  AKI on CKD stage 3 - Creat at discharge in Jan was 1.5, looks like his baseline is 1.8-2 according to the DC summary at that time - monitor  Dysautonomia/Orthostatic hypotension/supine hypertension - - Midodrine has caused urinary symptoms in past according to DC summary from Jan admission so isnt being used. - Prior work up negative, current suspicion is dysautonomia - IVF also  didn't help back in Jan. - Don't want to worsen the orthostasis, and definitely dont want to add HTN meds in setting of acute ischemic stroke, so will leave this alone. - If BP becomes too elevated, sit patient up in bed (will cause BP to drop).     - per daughter: midodrine causes severe urinary retention for him, florinef and pyridostigmine have caused his BP to skyrocket; difficult situation     - let's try TED hose and an abdominal binder and check orthostatics with those; will need to use bedside commode or urinal for relieving himself for now      - TED hose and abdominal binder are helpful; he needs to continue wearing them  E fecalis UTI  - Dose of rocephin given in ED - UCx e fecalis - consult pharmacy for ampicillin dosing     - awaiting sensitivities  DM2 - Sensitive SSI AC - acceptable this AM  HLD - crestor 5mg  qHS  Dementia w/o behavioral disturbance - family notes that he is being evaluated by neurology outpt for vascular dementia - family notes that he is becoming more difficult to take care of at home; and are requesting help w/ placement; CM notified  Tobacco abuse - nicotine patch - counseled against further use  Awaiting UTI sensitivities. Awaiting repeat COVID testing.  DVT prophylaxis: lovenox Code Status: FULL Family Communication: Spoke with dtr by phone   Disposition Plan: To SNF after COVID testing   Consultants:   Neurology   Antimicrobials:  . Ampicillin    Subjective: "Will I get that coronavirus?"  Objective: Vitals:   08/16/18 0003 08/16/18 6226  08/16/18 0416 08/16/18 0838  BP: (!) 197/93 (!) 221/106 (!) 162/92 (!) 152/90  Pulse: 82 87  87  Resp: 14 14  14   Temp: (!) 97.5 F (36.4 C) (!) 97.5 F (36.4 C)  97.6 F (36.4 C)  TempSrc: Oral Oral  Oral  SpO2: 100% 100%    Weight:      Height:        Intake/Output Summary (Last 24 hours) at 08/16/2018 1001 Last data filed at  08/16/2018 0446 Gross per 24 hour  Intake 899.45 ml  Output 1850 ml  Net -950.55 ml   Filed Weights   08/11/18 1939 08/12/18 0745  Weight: 79.4 kg 81.6 kg    Examination:  General:72 y.o.maleresting in bed in NAD Cardiovascular: RRR, +S1, S2, no m/g/r, equal pulses throughout Respiratory: CTABL, no w/r/r, normal WOB GI: BS+, NDNT, no masses noted, no organomegaly noted MSK: No e/c/c Skin: No rashes, bruises, ulcerations noted Neuro: A&O x 3, no focal deficits    Data Reviewed: I have personally reviewed following labs and imaging studies.  CBC: Recent Labs  Lab 08/11/18 2015 08/14/18 0435  WBC 5.0 5.1  NEUTROABS 2.7 2.4  HGB 8.4* 8.4*  HCT 26.7* 27.4*  MCV 86.4 85.4  PLT 268 295   Basic Metabolic Panel: Recent Labs  Lab 08/11/18 2015 08/14/18 0435 08/15/18 0820  NA 140 140 140  K 3.0* 3.0* 3.5  CL 103 103 105  CO2 27 28 25   GLUCOSE 184* 152* 162*  BUN 27* 18 18  CREATININE 2.37* 1.53* 1.44*  CALCIUM 8.2* 8.4* 8.2*  MG 2.0 1.5*  --   PHOS  --  3.2 2.3*   GFR: Estimated Creatinine Clearance: 51.6 mL/min (A) (by C-G formula based on SCr of 1.44 mg/dL (H)). Liver Function Tests: Recent Labs  Lab 08/11/18 2015 08/14/18 0435 08/15/18 0820  AST 16  --   --   ALT 13  --   --   ALKPHOS 69  --   --   BILITOT 0.6  --   --   PROT 6.8  --   --   ALBUMIN 3.4* 3.1* 3.2*   Recent Labs  Lab 08/11/18 2015  LIPASE 65*   No results for input(s): AMMONIA in the last 168 hours. Coagulation Profile: No results for input(s): INR, PROTIME in the last 168 hours. Cardiac Enzymes: Recent Labs  Lab 08/11/18 2015  TROPONINI <0.03   BNP (last 3 results) No results for input(s): PROBNP in the last 8760 hours. HbA1C: No results for input(s): HGBA1C in the last 72 hours. CBG: Recent Labs  Lab 08/15/18 0605 08/15/18 1137 08/15/18 1642 08/15/18 2126 08/16/18 0604  GLUCAP 200* 170* 150* 239* 183*   Lipid Profile: No results for input(s): CHOL, HDL,  LDLCALC, TRIG, CHOLHDL, LDLDIRECT in the last 72 hours. Thyroid Function Tests: No results for input(s): TSH, T4TOTAL, FREET4, T3FREE, THYROIDAB in the last 72 hours. Anemia Panel: No results for input(s): VITAMINB12, FOLATE, FERRITIN, TIBC, IRON, RETICCTPCT in the last 72 hours. Sepsis Labs: No results for input(s): PROCALCITON, LATICACIDVEN in the last 168 hours.  Recent Results (from the past 240 hour(s))  Urine culture     Status: Abnormal (Preliminary result)   Collection Time: 08/11/18 11:42 PM   Specimen: Urine, Clean Catch  Result Value Ref Range Status   Specimen Description URINE, CLEAN CATCH  Final   Special Requests NONE  Final   Culture (A)  Final    >=100,000 COLONIES/mL ENTEROCOCCUS FAECALIS Sent to [facility]  for further susceptibility testing. Performed at [facility], [place] [address], [place], [place] [address]    Report Status PENDING  Incomplete  SARS Coronavirus 2 (CEPHEID - Performed in [place] hospital lab), Hosp Order     Status: None   Collection Time: 08/12/18  3:10 AM   Specimen: Nasopharyngeal Swab  Result Value Ref Range Status   SARS Coronavirus 2 NEGATIVE NEGATIVE Final    Comment: (NOTE) If result is NEGATIVE SARS-CoV-2 target nucleic acids are NOT DETECTED. The SARS-CoV-2 RNA is generally detectable in upper and lower  respiratory specimens during the acute phase of infection. The lowest  concentration of SARS-CoV-2 viral copies this assay can detect is 250  copies / mL. A negative result does not preclude SARS-CoV-2 infection  and should not be used as the sole basis for treatment or other  patient management decisions.  A negative result may occur with  improper specimen collection / handling, submission of specimen other  than nasopharyngeal swab, presence of viral mutation(s) within the  areas targeted by this assay, and inadequate number of viral copies  (<250 copies / mL). A negative result must be combined with clinical   observations, patient history, and epidemiological information. If result is POSITIVE SARS-CoV-2 target nucleic acids are DETECTED. The SARS-CoV-2 RNA is generally detectable in upper and lower  respiratory specimens dur ing the acute phase of infection.  Positive  results are indicative of active infection with SARS-CoV-2.  Clinical  correlation with patient history and other diagnostic information is  necessary to determine patient infection status.  Positive results do  not rule out bacterial infection or co-infection with other viruses. If result is PRESUMPTIVE POSTIVE SARS-CoV-2 nucleic acids MAY BE PRESENT.   A presumptive positive result was obtained on the submitted specimen  and confirmed on repeat testing.  While 2019 novel coronavirus  (SARS-CoV-2) nucleic acids may be present in the submitted sample  additional confirmatory testing may be necessary for epidemiological  and / or clinical management purposes  to differentiate between  SARS-CoV-2 and other Sarbecovirus currently known to infect humans.  If clinically indicated additional testing with an alternate test  methodology [phone]) is advised. The SARS-CoV-2 RNA is generally  detectable in upper and lower respiratory sp ecimens during the acute  phase of infection. The expected result is Negative. Fact Sheet for Patients:  StrictlyIdeas.no Fact Sheet for Healthcare Providers: BankingDealers.co.za This test is not yet approved or cleared by the [place] FDA and has been authorized for detection and/or diagnosis of SARS-CoV-2 by FDA under an Emergency Use Authorization (EUA).  This EUA will remain in effect (meaning this test can be used) for the duration of the COVID-19 declaration under Section 564(b)(1) of the Act, 21 U.S.C. section 360bbb-3(b)(1), unless the authorization is terminated or revoked sooner. Performed at [facility], [place] [address],  [place], [place] [address]      Radiology Studies: No results found.    Scheduled Meds: .  stroke: mapping our early stages of recovery book   Does not apply Once  . aspirin EC  81 mg Oral Daily  . clopidogrel  75 mg Oral Daily  . enoxaparin (LOVENOX) injection  40 mg Subcutaneous Q24H  . insulin aspart  0-9 Units Subcutaneous TID WC  . nicotine  14 mg Transdermal Daily  . potassium chloride  40 mEq Oral BID  . rosuvastatin  5 mg Oral Daily  . sertraline  25 mg Oral Daily  Continuous Infusions: . sodium chloride Stopped (08/16/18 0108)  . ampicillin (OMNIPEN) IV 1 g (08/16/18 0109)     LOS: 4 days    Time spent: 25 minutes spent in the coordination of care today.    Jonnie Finner, DO [facility] Pager [phone]  If 7PM-7AM, please contact night-coverage [email or web address] Password [email or web address] 08/16/2018, 10:01 AM

## 2018-08-16 NOTE — Progress Notes (Signed)
Pharmacy Antibiotic Note  Harry [name] is a 73 y.o. male admitted on 08/11/2018 with dizziness and weakness.  Remains on ampicillin for enterococcus UTI. Waiting for sensitivities.   Plan: Ampicillin 1gm IV Q6H Would plan 7 day course Monitor renal fxn, cultures + sensitivities   Height: 6' (182.9 cm) Weight: 180 lb (81.6 kg) IBW/kg (Calculated) : 77.6  Temp (24hrs), Avg:97.6 F (36.4 C), Min:97.5 F (36.4 C), Max:97.7 F (36.5 C)  Recent Labs  Lab 08/11/18 2015 08/14/18 0435 08/15/18 0820  WBC 5.0 5.1  --   CREATININE 2.37* 1.53* 1.44*    Estimated Creatinine Clearance: 51.6 mL/min (A) (by C-G formula based on SCr of 1.44 mg/dL (H)).    Allergies  Allergen Reactions  . Lipitor [Atorvastatin] Other (See Comments)    Myalgia   . Statins Other (See Comments)    Myalgia (CAN tolerate Crestor, however)  . Pravachol [Pravastatin] Rash    Amp 6/10 >> CTX x1 6/9  6/8 UCx - E.faecalis 6/9 covid - negative  Harvel Quale 08/16/2018 10:37 AM

## 2018-08-17 LAB — GLUCOSE, CAPILLARY
Glucose-Capillary: 150 mg/dL — ABNORMAL HIGH (ref 70–99)
Glucose-Capillary: 255 mg/dL — ABNORMAL HIGH (ref 70–99)
Glucose-Capillary: 225 mg/dL — ABNORMAL HIGH (ref 70–99)
Glucose-Capillary: 232 mg/dL — ABNORMAL HIGH (ref 70–99)

## 2018-08-17 LAB — CBC WITH DIFFERENTIAL/PLATELET
Abs Immature Granulocytes: 0.01 10*3/uL (ref 0.00–0.07)
Basophils Absolute: 0 10*3/uL (ref 0.0–0.1)
Basophils Relative: 1 %
Eosinophils Absolute: 0.2 10*3/uL (ref 0.0–0.5)
Eosinophils Relative: 3 %
HCT: 27.3 % — ABNORMAL LOW (ref 39.0–52.0)
Hemoglobin: 8.4 g/dL — ABNORMAL LOW (ref 13.0–17.0)
Immature Granulocytes: 0 %
Lymphocytes Relative: 42 %
Lymphs Abs: 2.3 10*3/uL (ref 0.7–4.0)
MCH: 26.5 pg (ref 26.0–34.0)
MCHC: 30.8 g/dL (ref 30.0–36.0)
MCV: 86.1 fL (ref 80.0–100.0)
Monocytes Absolute: 0.6 10*3/uL (ref 0.1–1.0)
Monocytes Relative: 11 %
Neutro Abs: 2.4 10*3/uL (ref 1.7–7.7)
Neutrophils Relative %: 43 %
Platelets: 247 10*3/uL (ref 150–400)
RBC: 3.17 MIL/uL — ABNORMAL LOW (ref 4.22–5.81)
RDW: 15.9 % — ABNORMAL HIGH (ref 11.5–15.5)
WBC: 5.6 10*3/uL (ref 4.0–10.5)
nRBC: 0 % (ref 0.0–0.2)

## 2018-08-17 LAB — IRON AND TIBC
Iron: 35 ug/dL — ABNORMAL LOW (ref 45–182)
Saturation Ratios: 11 % — ABNORMAL LOW (ref 17.9–39.5)
TIBC: 332 ug/dL (ref 250–450)
UIBC: 297 ug/dL

## 2018-08-17 LAB — TSH: TSH: 1.686 u[IU]/mL (ref 0.350–4.500)

## 2018-08-17 LAB — MAGNESIUM: Magnesium: 1.6 mg/dL — ABNORMAL LOW (ref 1.7–2.4)

## 2018-08-17 LAB — FERRITIN: Ferritin: 34 ng/mL (ref 24–336)

## 2018-08-17 MED ORDER — MAGNESIUM OXIDE 400 (241.3 MG) MG PO TABS
400.0000 mg | ORAL_TABLET | Freq: Every day | ORAL | Status: DC
  Administered 2018-08-17 – 2018-08-19 (×3): 400 mg via ORAL
  Filled 2018-08-17 (×3): qty 1

## 2018-08-17 NOTE — Progress Notes (Signed)
[name] [facility]  PROGRESS NOTE    Harry HUBERS  BSJ:[id number] DOB: Mar 20, 1945 DOA: 08/11/2018 PCP: [name], MD   Brief Narrative:   [name] a 73 y.o.malewith medical history significant ofCKD stage 3, prior strokes, DM2, PAD, orthostatic hypotension with supine hypertension. Work up unrevealing back in Jan this year. Patient currently on Pyridostigmine.  Patient presents to the ED with c/o 2 week history of dizziness, difficulty with walking and weakness. Symptoms worse when he stands up, better when he lays down.  Assessment & Plan:   Principal Problem:   Acute ischemic stroke [facility]) Active Problems:   Type 2 diabetes mellitus with stage 3 chronic kidney disease, with long-term current use of insulin (HCC)   Hyperlipidemia LDL goal <70   TOBACCO USE   Essential hypertension   Acute renal failure superimposed on stage 3 chronic kidney disease (HCC)   Orthostatic hypotension   UTI (urinary tract infection) due to Enterococcus   Dementia without behavioral disturbance (HCC)  Acute ischemic stroke - with dizziness - the visual field cut is believed to be due to an old occipital stroke also seen on imaging. - Looks like issues are predominately posterior circulation with the acute strokes here as well as the chronic infarcts. - MRA head noted - 2D Echo EF 50-55%, no m/t/v - PT/OT:SNF recommendation - SLP: no further needs - Carotid dopplers: b/l 1-39% stenosis - Neuro consult: continue ASA/plavix/statin, follow up with neuro outpt  AKI on CKD stage 3 - Creat at discharge in Jan was 1.5, looks like his baseline is 1.8-2 according to the DC summary at that time - monitor  Dysautonomia/Orthostatic hypotension/supine hypertension - - Midodrine has caused urinary symptoms in past according to DC summary from Jan admission so isnt being used. - Prior work up negative, current suspicion is dysautonomia - IVF also  didn't help back in Jan. - Don't want to worsen the orthostasis, and definitely dont want to add HTN meds in setting of acute ischemic stroke, so will leave this alone. - If BP becomes too elevated, sit patient up in bed (will cause BP to drop). - per daughter: midodrine causes severe urinary retention for him, florinef and pyridostigmine have caused his BP to skyrocket; difficult situation - let's try TED hose and an abdominal binder and check orthostatics with those; will need to use bedside commode or urinal for relieving himself for now      - TED hose and abdominal binder are helpful; he needs to continue wearing them  E fecalis UTI  - Dose of rocephin given in ED - UCx e fecalis - consult pharmacy for ampicillin dosing - awaiting sensitivities; this was a send out to [facility]  DM2 - Sensitive SSI AC - acceptable this AM  HLD - crestor 5mg  qHS  Dementia w/o behavioral disturbance - family notes that he is being evaluated by neurology outpt for vascular dementia - family notes that he is becoming more difficult to take care of at home; and are requesting help w/ placement; CM notified     - will go to SNF for STR  Tobacco abuse - nicotine patch - counseled against further use  Hypomagnesemia     - add magOx  Normocytic anemia     - w/o frank bleed     - check TSH, iron studies   DVT prophylaxis:lovenox Code Status:FULL Disposition Plan:To SNF after COVID testing   Consultants:   Neurology  Antimicrobials:  . Ampicillin    Subjective: No  acute events ON per nursing.  Objective: Vitals:   08/16/18 1543 08/16/18 1943 08/16/18 2345 08/17/18 0325  BP: 121/67 136/66 126/72 128/64  Pulse: 88 81 88 81  Resp: 16 18 18 18   Temp:  97.9 F (36.6 C) 98 F (36.7 C) 98.1 F (36.7 C)  TempSrc:  Oral Oral Oral  SpO2: 100% 100% 100% 100%  Weight:      Height:        Intake/Output Summary (Last 24  hours) at 08/17/2018 0659 Last data filed at 08/17/2018 0326 Gross per 24 hour  Intake -  Output 1400 ml  Net -1400 ml   Filed Weights   08/11/18 1939 08/12/18 0745  Weight: 79.4 kg 81.6 kg    Examination:  General:72 y.o.maleresting in bed in NAD Cardiovascular: RRR, +S1, S2, no m/g/r, equal pulses throughout Respiratory: CTABL, no w/r/r, normal WOB GI: BS+, NDNT, no masses noted, no organomegaly noted MSK: No e/c/c Skin: No rashes, bruises, ulcerations noted    Data Reviewed: I have personally reviewed following labs and imaging studies.  CBC: Recent Labs  Lab 08/11/18 2015 08/14/18 0435 08/16/18 1125 08/17/18 0448  WBC 5.0 5.1 4.6 5.6  NEUTROABS 2.7 2.4 2.3 2.4  HGB 8.4* 8.4* 8.8* 8.4*  HCT 26.7* 27.4* 28.8* 27.3*  MCV 86.4 85.4 87.0 86.1  PLT 268 255 255 161   Basic Metabolic Panel: Recent Labs  Lab 08/11/18 2015 08/14/18 0435 08/15/18 0820 08/16/18 1125 08/17/18 0448  NA 140 140 140 137  --   K 3.0* 3.0* 3.5 4.5  --   CL 103 103 105 106  --   CO2 27 28 25 23   --   GLUCOSE 184* 152* 162* 216*  --   BUN 27* 18 18 18   --   CREATININE 2.37* 1.53* 1.44* 1.61*  --   CALCIUM 8.2* 8.4* 8.2* 8.6*  --   MG 2.0 1.5*  --  1.6* 1.6*  PHOS  --  3.2 2.3* 2.4*  --    GFR: Estimated Creatinine Clearance: 46.2 mL/min (A) (by C-G formula based on SCr of 1.61 mg/dL (H)). Liver Function Tests: Recent Labs  Lab 08/11/18 2015 08/14/18 0435 08/15/18 0820 08/16/18 1125  AST 16  --   --   --   ALT 13  --   --   --   ALKPHOS 69  --   --   --   BILITOT 0.6  --   --   --   PROT 6.8  --   --   --   ALBUMIN 3.4* 3.1* 3.2* 3.4*   Recent Labs  Lab 08/11/18 2015  LIPASE 65*   No results for input(s): AMMONIA in the last 168 hours. Coagulation Profile: No results for input(s): INR, PROTIME in the last 168 hours. Cardiac Enzymes: Recent Labs  Lab 08/11/18 2015  TROPONINI <0.03   BNP (last 3 results) No results for input(s): PROBNP in the last 8760 hours.  HbA1C: No results for input(s): HGBA1C in the last 72 hours. CBG: Recent Labs  Lab 08/16/18 0604 08/16/18 1233 08/16/18 1658 08/16/18 2116 08/17/18 0604  GLUCAP 183* 225* 195* 194* 150*   Lipid Profile: No results for input(s): CHOL, HDL, LDLCALC, TRIG, CHOLHDL, LDLDIRECT in the last 72 hours. Thyroid Function Tests: No results for input(s): TSH, T4TOTAL, FREET4, T3FREE, THYROIDAB in the last 72 hours. Anemia Panel: No results for input(s): VITAMINB12, FOLATE, FERRITIN, TIBC, IRON, RETICCTPCT in the last 72 hours. Sepsis Labs: No results for input(s): PROCALCITON,  LATICACIDVEN in the last 168 hours.  Recent Results (from the past 240 hour(s))  Urine culture     Status: Abnormal (Preliminary result)   Collection Time: 08/11/18 11:42 PM   Specimen: Urine, Clean Catch  Result Value Ref Range Status   Specimen Description URINE, CLEAN CATCH  Final   Special Requests NONE  Final   Culture (A)  Final    >=100,000 COLONIES/mL ENTEROCOCCUS FAECALIS Sent to [facility] for further susceptibility testing. Performed at [facility], [place] [address] [place], [place] [address]    Report Status PENDING  Incomplete  SARS Coronavirus 2 (CEPHEID - Performed in [facility] hospital lab), Hosp Order     Status: None   Collection Time: 08/12/18  3:10 AM   Specimen: Nasopharyngeal Swab  Result Value Ref Range Status   SARS Coronavirus 2 NEGATIVE NEGATIVE Final    Comment: (NOTE) If result is NEGATIVE SARS-CoV-2 target nucleic acids are NOT DETECTED. The SARS-CoV-2 RNA is generally detectable in upper and lower  respiratory specimens during the acute phase of infection. The lowest  concentration of SARS-CoV-2 viral copies this assay can detect is 250  copies / mL. A negative result does not preclude SARS-CoV-2 infection  and should not be used as the sole basis for treatment or other  patient management decisions.  A negative result may occur with  improper specimen collection /  handling, submission of specimen other  than nasopharyngeal swab, presence of viral mutation(s) within the  areas targeted by this assay, and inadequate number of viral copies  (<250 copies / mL). A negative result must be combined with clinical  observations, patient history, and epidemiological information. If result is POSITIVE SARS-CoV-2 target nucleic acids are DETECTED. The SARS-CoV-2 RNA is generally detectable in upper and lower  respiratory specimens dur ing the acute phase of infection.  Positive  results are indicative of active infection with SARS-CoV-2.  Clinical  correlation with patient history and other diagnostic information is  necessary to determine patient infection status.  Positive results do  not rule out bacterial infection or co-infection with other viruses. If result is PRESUMPTIVE POSTIVE SARS-CoV-2 nucleic acids MAY BE PRESENT.   A presumptive positive result was obtained on the submitted specimen  and confirmed on repeat testing.  While 2019 novel coronavirus  (SARS-CoV-2) nucleic acids may be present in the submitted sample  additional confirmatory testing may be necessary for epidemiological  and / or clinical management purposes  to differentiate between  SARS-CoV-2 and other Sarbecovirus currently known to infect humans.  If clinically indicated additional testing with an alternate test  methodology [phone]) is advised. The SARS-CoV-2 RNA is generally  detectable in upper and lower respiratory sp ecimens during the acute  phase of infection. The expected result is Negative. Fact Sheet for Patients:  StrictlyIdeas.no Fact Sheet for Healthcare Providers: BankingDealers.co.za This test is not yet approved or cleared by the [place] FDA and has been authorized for detection and/or diagnosis of SARS-CoV-2 by FDA under an Emergency Use Authorization (EUA).  This EUA will remain in effect (meaning this  test can be used) for the duration of the COVID-19 declaration under Section 564(b)(1) of the Act, 21 U.S.C. section 360bbb-3(b)(1), unless the authorization is terminated or revoked sooner. Performed at [facility], [place] [address], [place],  [address]          Radiology Studies: No results found.      Scheduled Meds: .  stroke: mapping our early  stages of recovery book   Does not apply Once  . aspirin EC  81 mg Oral Daily  . clopidogrel  75 mg Oral Daily  . enoxaparin (LOVENOX) injection  40 mg Subcutaneous Q24H  . insulin aspart  0-9 Units Subcutaneous TID WC  . potassium chloride  40 mEq Oral BID  . rosuvastatin  5 mg Oral Daily  . sertraline  25 mg Oral Daily   Continuous Infusions: . sodium chloride Stopped (08/16/18 0108)  . ampicillin (OMNIPEN) IV 1 g (08/17/18 0518)     LOS: 5 days    Time spent: 25 minutes spent in the coordination of care today.    Jonnie Finner, DO [facility] Pager [phone]  If 7PM-7AM, please contact night-coverage [email or web address] Password [facility] 08/17/2018, 6:59 AM

## 2018-08-18 LAB — CBC WITH DIFFERENTIAL/PLATELET
Abs Immature Granulocytes: 0.01 10*3/uL (ref 0.00–0.07)
Basophils Absolute: 0 10*3/uL (ref 0.0–0.1)
Basophils Relative: 1 %
Eosinophils Absolute: 0.2 10*3/uL (ref 0.0–0.5)
Eosinophils Relative: 4 %
HCT: 27.2 % — ABNORMAL LOW (ref 39.0–52.0)
Hemoglobin: 8.2 g/dL — ABNORMAL LOW (ref 13.0–17.0)
Immature Granulocytes: 0 %
Lymphocytes Relative: 39 %
Lymphs Abs: 2.1 10*3/uL (ref 0.7–4.0)
MCH: 25.8 pg — ABNORMAL LOW (ref 26.0–34.0)
MCHC: 30.1 g/dL (ref 30.0–36.0)
MCV: 85.5 fL (ref 80.0–100.0)
Monocytes Absolute: 0.6 10*3/uL (ref 0.1–1.0)
Monocytes Relative: 11 %
Neutro Abs: 2.5 10*3/uL (ref 1.7–7.7)
Neutrophils Relative %: 45 %
Platelets: 251 10*3/uL (ref 150–400)
RBC: 3.18 MIL/uL — ABNORMAL LOW (ref 4.22–5.81)
RDW: 15.9 % — ABNORMAL HIGH (ref 11.5–15.5)
WBC: 5.5 10*3/uL (ref 4.0–10.5)
nRBC: 0 % (ref 0.0–0.2)

## 2018-08-18 LAB — GLUCOSE, CAPILLARY
Glucose-Capillary: 139 mg/dL — ABNORMAL HIGH (ref 70–99)
Glucose-Capillary: 249 mg/dL — ABNORMAL HIGH (ref 70–99)
Glucose-Capillary: 201 mg/dL — ABNORMAL HIGH (ref 70–99)
Glucose-Capillary: 121 mg/dL — ABNORMAL HIGH (ref 70–99)

## 2018-08-18 LAB — RENAL FUNCTION PANEL
Albumin: 3.1 g/dL — ABNORMAL LOW (ref 3.5–5.0)
Anion gap: 7 (ref 5–15)
BUN: 29 mg/dL — ABNORMAL HIGH (ref 8–23)
CO2: 23 mmol/L (ref 22–32)
Calcium: 8.7 mg/dL — ABNORMAL LOW (ref 8.9–10.3)
Chloride: 107 mmol/L (ref 98–111)
Creatinine, Ser: 1.81 mg/dL — ABNORMAL HIGH (ref 0.61–1.24)
GFR calc Af Amer: 43 mL/min — ABNORMAL LOW (ref >60)
GFR calc non Af Amer: 37 mL/min — ABNORMAL LOW (ref >60)
Glucose, Bld: 159 mg/dL — ABNORMAL HIGH (ref 70–99)
Phosphorus: 3 mg/dL (ref 2.5–4.6)
Potassium: 5.3 mmol/L — ABNORMAL HIGH (ref 3.5–5.1)
Sodium: 137 mmol/L (ref 135–145)

## 2018-08-18 LAB — NOVEL CORONAVIRUS, NAA (HOSP ORDER, SEND-OUT TO REF LAB; TAT 18-24 HRS): SARS-CoV-2, NAA: NOT DETECTED

## 2018-08-18 LAB — MAGNESIUM: Magnesium: 1.8 mg/dL (ref 1.7–2.4)

## 2018-08-18 NOTE — Progress Notes (Signed)
Inpatient Diabetes Program Recommendations  AACE/ADA: New Consensus Statement on Inpatient Glycemic Control (2015)  Target Ranges:  Prepandial:   less than 140 mg/dL      Peak postprandial:   less than 180 mg/dL (1-2 hours)      Critically ill patients:  140 - 180 mg/dL   Results for OZIAH, VITANZA (MRN [id number]) as of 08/18/2018 10:13  Ref. Range 08/17/2018 06:04 08/17/2018 12:22 08/17/2018 16:42 08/17/2018 21:05  Glucose-Capillary Latest Ref Range: 70 - 99 mg/dL 150 (H)  1 unit NOVOLOG given late at 9:44am  255 (H) 225 (H)  2 units NOVOLOG  232 (H)   Results for IAIN, SAWCHUK (MRN [id number]) as of 08/18/2018 10:13  Ref. Range 08/18/2018 06:03  Glucose-Capillary Latest Ref Range: 70 - 99 mg/dL 139 (H)  1 unit NOVOLOG given late at [place] [facility] Meds: Basaglar 14 units QHS  Current Orders: Novolog Sensitive Correction Scale/ SSI (0-9 units) TID AC     Note patient was having Severe Hypoglycemia on AM of 06/10--Lantus was stopped as a result.  CBGs in the AM OK.  CBGs after meals elevated.     MD- Please consider adding low dose Novolog Meal Coverage:   Novolog 3 units TID with meals  (Please add the following Hold Parameters: Hold if pt eats <50% of meal, Hold if pt NPO)     --Will follow patient during hospitalization--  Wyn Quaker RN, MSN, CDE Diabetes Coordinator Inpatient Glycemic Control Team Team Pager: [phone] (8a-5p)

## 2018-08-18 NOTE — Progress Notes (Signed)
Physical Therapy Treatment Patient Details Name: Harry [name] MRN: [id number] DOB: 1945-03-29 Today's Date: 08/18/2018    History of Present Illness Pt is a 73 y/o male admitted secondary to increased dizziness and weakness. MRI revealed L PCA territory infarct. PMH includes DM, CKD, HTN, dementia, CAD s/p stent, and CVA.     PT Comments    Patient seen for mobility progression. Pt agreeable to short distance gait in room due to c/o dizziness with postural changes. Pt with TED hose donned throughout session but no abdominal binder. See BP in general comments below. Pt requires assistance for safe OOB mobility using AD. Continue to progress as tolerated with anticipated d/c to SNF for further skilled PT services.     Follow Up Recommendations  Supervision/Assistance - 24 hour;SNF     Equipment Recommendations  Rolling walker with 5" wheels    Recommendations for Other Services       Precautions / Restrictions Precautions Precautions: Fall Restrictions Weight Bearing Restrictions: No    Mobility  Bed Mobility Overal bed mobility: Needs Assistance Bed Mobility: Sit to Supine       Sit to supine: Supervision   General bed mobility comments: Supervision for safety  Transfers Overall transfer level: Needs assistance Equipment used: Rolling walker (2 wheeled) Transfers: Sit to/from Stand Sit to Stand: Min assist         General transfer comment: assist to steady and to stabilize RW; cues for safe hand placement  Ambulation/Gait Ambulation/Gait assistance: Min assist Gait Distance (Feet): 20 Feet Assistive device: Rolling walker (2 wheeled) Gait Pattern/deviations: Step-through pattern;Trunk flexed;Decreased stride length;Drifts right/left;Decreased dorsiflexion - right;Decreased dorsiflexion - left Gait velocity: Decreased    General Gait Details: pt agreeable to only short distance gait in room; assistance for safe use and guiding of AD; mutlimodal cues for  upright posture and maintaining safe proximity to RW; pt tends to increase trunk flexion with increased time standing and with limited bilat foot clearance   Stairs             Wheelchair Mobility    Modified Rankin (Stroke Patients Only) Modified Rankin (Stroke Patients Only) Pre-Morbid Rankin Score: Moderate disability Modified Rankin: Moderately severe disability     Balance Overall balance assessment: Needs assistance Sitting-balance support: No upper extremity supported;Feet supported Sitting balance-Leahy Scale: Fair     Standing balance support: Bilateral upper extremity supported;During functional activity Standing balance-Leahy Scale: Poor Standing balance comment: Reliant on BUE suport                             Cognition Arousal/Alertness: Awake/alert Behavior During Therapy: WFL for tasks assessed/performed Overall Cognitive Status: History of cognitive impairments - at baseline                                        Exercises      General Comments General comments (skin integrity, edema, etc.): pt with TED hose donned and abdominal binder off upon arrival; pt continues to c/o dizziness with postural changes; BP in sitting 173/100, in standing 103/64, and supine end of session 170/93      Pertinent Vitals/Pain Pain Assessment: No/denies pain    Home Living                      Prior Function  PT Goals (current goals can now be found in the care plan section) Progress towards PT goals: Not progressing toward goals - comment(dizziness in standing  )    Frequency    Min 4X/week      PT Plan Current plan remains appropriate    Co-evaluation              AM-PAC PT "6 Clicks" Mobility   Outcome Measure  Help needed turning from your back to your side while in a flat bed without using bedrails?: None Help needed moving from lying on your back to sitting on the side of a flat bed without  using bedrails?: A Little Help needed moving to and from a bed to a chair (including a wheelchair)?: A Little Help needed standing up from a chair using your arms (e.g., wheelchair or bedside chair)?: A Little Help needed to walk in hospital room?: A Lot Help needed climbing 3-5 steps with a railing? : A Lot 6 Click Score: 17    End of Session Equipment Utilized During Treatment: Gait belt Activity Tolerance: Other (comment)(limited by c/o dizziness ) Patient left: with call bell/phone within reach;in bed;with bed alarm set Nurse Communication: Mobility status PT Visit Diagnosis: Unsteadiness on feet (R26.81);Muscle weakness (generalized) (M62.81)     Time: 6004-5997 PT Time Calculation (min) (ACUTE ONLY): 23 min  Charges:  $Gait Training: 8-22 mins $Therapeutic Activity: 8-22 mins                     Earney Navy, PTA Acute Rehabilitation Services Pager: [phone] Office: [phone]     Darliss Cheney 08/18/2018, 4:19 PM

## 2018-08-18 NOTE — Progress Notes (Signed)
Occupational Therapy Treatment Patient Details Name: Harry [name] MRN: [id number] DOB: [birth year] Today's Date: 08/18/2018    History of present illness Pt is a 73 y/o male admitted secondary to increased dizziness and weakness. MRI revealed L PCA territory infarct. PMH includes DM, CKD, HTN, dementia, CAD s/p stent, and CVA.    OT comments  Pt progressing towards OT goals this session. Agreeable to sit EOB for grooming tasks, able to perform with set up. Pt then able to take steps up the bed with HHA for repositioning. Supervision for bed mobility. Current POC remains appropriate. OT will continue to follow acutely.    Follow Up Recommendations  SNF;Supervision/Assistance - 24 hour    Equipment Recommendations  Other (comment)(defer to next venue)    Recommendations for Other Services      Precautions / Restrictions Precautions Precautions: Fall Restrictions Weight Bearing Restrictions: No       Mobility Bed Mobility Overal bed mobility: Needs Assistance Bed Mobility: Sit to Supine;Supine to Sit     Supine to sit: Supervision Sit to supine: Supervision   General bed mobility comments: Supervision for safety  Transfers Overall transfer level: Needs assistance Equipment used: 1 person hand held assist Transfers: Sit to/from Stand Sit to Stand: Min assist         General transfer comment: assist to steady and boost with HHA for steps up bed    Balance Overall balance assessment: Needs assistance Sitting-balance support: No upper extremity supported;Feet supported Sitting balance-Leahy Scale: Fair     Standing balance support: Bilateral upper extremity supported;During functional activity Standing balance-Leahy Scale: Poor Standing balance comment: Reliant on BUE suport                            ADL either performed or assessed with clinical judgement   ADL Overall ADL's : Needs assistance/impaired     Grooming: Wash/dry hands;Wash/dry  face;Oral care;Set up;Sitting Grooming Details (indicate cue type and reason): EOB today due to recent session with PT                 Toilet Transfer: Minimal assistance;RW           Functional mobility during ADLs: Minimal assistance(HHA) General ADL Comments: Pt feeling "off" due to recent activity with PT, able to take steps up the bed with HHA after seated grooming EOB     Vision       Perception     Praxis      Cognition Arousal/Alertness: Awake/alert Behavior During Therapy: WFL for tasks assessed/performed Overall Cognitive Status: History of cognitive impairments - at baseline                                          Exercises     Shoulder Instructions       General Comments pt with TED hose donned and abdominal binder off upon arrival; pt continues to c/o dizziness with postural changes; BP in sitting 173/100, in standing 103/64, and supine end of session 170/93    Pertinent Vitals/ Pain       Pain Assessment: No/denies pain  Home Living  Prior Functioning/Environment              Frequency  Min 2X/week        Progress Toward Goals  OT Goals(current goals can now be found in the care plan section)  Progress towards OT goals: Progressing toward goals  Acute Rehab OT Goals Patient Stated Goal: to go home OT Goal Formulation: With patient Time For Goal Achievement: 08/27/18 Potential to Achieve Goals: Good  Plan Discharge plan remains appropriate;Frequency remains appropriate    Co-evaluation                 AM-PAC OT "6 Clicks" Daily Activity     Outcome Measure   Help from another person eating meals?: None Help from another person taking care of personal grooming?: A Little Help from another person toileting, which includes using toliet, bedpan, or urinal?: A Little Help from another person bathing (including washing, rinsing, drying)?: A  Little Help from another person to put on and taking off regular upper body clothing?: A Little Help from another person to put on and taking off regular lower body clothing?: A Little 6 Click Score: 19    End of Session    OT Visit Diagnosis: Unsteadiness on feet (R26.81);Muscle weakness (generalized) (M62.81)   Activity Tolerance Other (comment)(limited by dizziness, lightheadedness)   Patient Left in bed;with call bell/phone within reach;with bed alarm set   Nurse Communication Mobility status        Time: 9914-4458 OT Time Calculation (min): 13 min  Charges: OT General Charges $OT Visit: 1 Visit OT Treatments $Self Care/Home Management : 8-22 mins  08/18/2018  Hulda Humphrey OTR/L Acute Rehabilitation Services Pager: [phone] Office: [place] 08/18/2018, 5:25 PM

## 2018-08-18 NOTE — Progress Notes (Signed)
[name]  PROGRESS NOTE    MORRELL FLUKE  YNW:[id number] DOB: [birth year] DOA: 08/11/2018 PCP: [name], MD   Brief Narrative:   Harry [name] a 73 y.o.malewith medical history significant ofCKD stage 3, prior strokes, DM2, PAD, orthostatic hypotension with supine hypertension. Work up unrevealing back in Jan this year. Patient currently on Pyridostigmine.  Patient presents to the ED with c/o 2 week history of dizziness, difficulty with walking and weakness. Symptoms worse when he stands up, better when he lays down.   Assessment & Plan:   Principal Problem:   Acute ischemic stroke [facility]) Active Problems:   Type 2 diabetes mellitus with stage 3 chronic kidney disease, with long-term current use of insulin (HCC)   Hyperlipidemia LDL goal <70   TOBACCO USE   Essential hypertension   Acute renal failure superimposed on stage 3 chronic kidney disease (HCC)   Orthostatic hypotension   UTI (urinary tract infection) due to Enterococcus   Dementia without behavioral disturbance (HCC)   Acute ischemic stroke - with dizziness - the visual field cut is believed to be due to an old occipital stroke also seen on imaging. - Looks like issues are predominately posterior circulation with the acute strokes here as well as the chronic infarcts. - MRA head noted - 2D Echo EF 50-55%, no m/t/v - PT/OT:SNF recommendation - SLP: no further needs - Carotid dopplers: b/l 1-39% stenosis - Neuro consult: continue ASA/plavix/statin, follow up with neuro outpt  AKI on CKD stage 3 - Creat at discharge in Jan was 1.5, looks like his baseline is 1.8-2 according to the DC summary at that time -monitor  Dysautonomia/Orthostatic hypotension/supine hypertension - - Midodrine has caused urinary symptoms in past according to DC summary from Jan admission so isnt being used. - Prior work up negative, current suspicion is dysautonomia - IVF also  didn't help back in Jan. - Don't want to worsen the orthostasis, and definitely dont want to add HTN meds in setting of acute ischemic stroke, so will leave this alone. - If BP becomes too elevated, sit patient up in bed (will cause BP to drop). - per daughter: midodrine causes severe urinary retention for him, florinef and pyridostigmine have caused his BP to skyrocket; difficult situation - let's try TED hose and an abdominal binder and check orthostatics with those; will need to use bedside commode or urinal for relieving himself for now - TED hose and abdominal binder are helpful; he needs to continue wearing them  E fecalis UTI  - Dose of rocephin given in ED - UCx e fecalis - consult pharmacy for ampicillin dosing - awaiting sensitivities; this was a send out to [facility]  DM2 - Sensitive SSI AC - acceptable this AM  HLD - crestor 5mg  qHS  Dementia w/o behavioral disturbance - family notes that he is being evaluated by neurology outpt for vascular dementia - family notes that he is becoming more difficult to take care of at home; and are requesting help w/ placement; CM notified     - will go to SNF for STR  Tobacco abuse - nicotine patch - counseled against further use  Hypomagnesemia     - add magOx  Normocytic anemia     - w/o frank bleed     - check TSH, iron studies  Repeat COVID negative. Awaiting sensitivities.   DVT prophylaxis:lovenox Code Status:FULL Disposition Plan:To SNF.   Consultants:   Neurology   Antimicrobials:  . Ampicillin    Subjective: No  acute events ON per nursing.  Objective: Vitals:   08/17/18 1531 08/17/18 1940 08/17/18 2330 08/18/18 0339  BP: (!) 160/96 (!) 154/83 139/88 133/71  Pulse: 89 88 92 86  Resp: 16 18 16 18   Temp: 98.5 F (36.9 C) 98.3 F (36.8 C) 98 F (36.7 C) 98.3 F (36.8 C)  TempSrc: Oral Oral Oral Oral  SpO2: 100% 100% 98% 99%   Weight:      Height:        Intake/Output Summary (Last 24 hours) at 08/18/2018 0707 Last data filed at 08/18/2018 0340 Gross per 24 hour  Intake -  Output 1100 ml  Net -1100 ml   Filed Weights   08/11/18 1939 08/12/18 0745  Weight: 79.4 kg 81.6 kg    Examination: General:72 y.o.maleresting in bed in NAD Cardiovascular: RRR, +S1, S2, no m/g/r, equal pulses throughout Respiratory: CTABL, no w/r/r, normal WOB GI: BS+, NDNT, no masses noted, no organomegaly noted MSK: No e/c/c Skin: No rashes, bruises, ulcerations noted    Data Reviewed: I have personally reviewed following labs and imaging studies.  CBC: Recent Labs  Lab 08/11/18 2015 08/14/18 0435 08/16/18 1125 08/17/18 0448 08/18/18 0521  WBC 5.0 5.1 4.6 5.6 5.5  NEUTROABS 2.7 2.4 2.3 2.4 2.5  HGB 8.4* 8.4* 8.8* 8.4* 8.2*  HCT 26.7* 27.4* 28.8* 27.3* 27.2*  MCV 86.4 85.4 87.0 86.1 85.5  PLT 268 255 255 247 790   Basic Metabolic Panel: Recent Labs  Lab 08/11/18 2015 08/14/18 0435 08/15/18 0820 08/16/18 1125 08/17/18 0448 08/18/18 0521  NA 140 140 140 137  --  137  K 3.0* 3.0* 3.5 4.5  --  5.3*  CL 103 103 105 106  --  107  CO2 27 28 25 23   --  23  GLUCOSE 184* 152* 162* 216*  --  159*  BUN 27* 18 18 18   --  29*  CREATININE 2.37* 1.53* 1.44* 1.61*  --  1.81*  CALCIUM 8.2* 8.4* 8.2* 8.6*  --  8.7*  MG 2.0 1.5*  --  1.6* 1.6* 1.8  PHOS  --  3.2 2.3* 2.4*  --  3.0   GFR: Estimated Creatinine Clearance: 41.1 mL/min (A) (by C-G formula based on SCr of 1.81 mg/dL (H)). Liver Function Tests: Recent Labs  Lab 08/11/18 2015 08/14/18 0435 08/15/18 0820 08/16/18 1125 08/18/18 0521  AST 16  --   --   --   --   ALT 13  --   --   --   --   ALKPHOS 69  --   --   --   --   BILITOT 0.6  --   --   --   --   PROT 6.8  --   --   --   --   ALBUMIN 3.4* 3.1* 3.2* 3.4* 3.1*   Recent Labs  Lab 08/11/18 2015  LIPASE 65*   No results for input(s): AMMONIA in the last 168 hours. Coagulation Profile: No  results for input(s): INR, PROTIME in the last 168 hours. Cardiac Enzymes: Recent Labs  Lab 08/11/18 2015  TROPONINI <0.03   BNP (last 3 results) No results for input(s): PROBNP in the last 8760 hours. HbA1C: No results for input(s): HGBA1C in the last 72 hours. CBG: Recent Labs  Lab 08/17/18 0604 08/17/18 1222 08/17/18 1642 08/17/18 2105 08/18/18 0603  GLUCAP 150* 255* 225* 232* 139*   Lipid Profile: No results for input(s): CHOL, HDL, LDLCALC, TRIG, CHOLHDL, LDLDIRECT in the last 72  hours. Thyroid Function Tests: Recent Labs    08/17/18 1039  TSH 1.686   Anemia Panel: Recent Labs    08/17/18 1039  FERRITIN 34  TIBC 332  IRON 35*   Sepsis Labs: No results for input(s): PROCALCITON, LATICACIDVEN in the last 168 hours.  Recent Results (from the past 240 hour(s))  Urine culture     Status: Abnormal (Preliminary result)   Collection Time: 08/11/18 11:42 PM   Specimen: Urine, Clean Catch  Result Value Ref Range Status   Specimen Description URINE, CLEAN CATCH  Final   Special Requests NONE  Final   Culture (A)  Final    >=100,000 COLONIES/mL ENTEROCOCCUS FAECALIS Sent to [place] for further susceptibility testing. Performed at [facility], [place] [address] [place], [place] [address]    Report Status PENDING  Incomplete  SARS Coronavirus 2 (CEPHEID - Performed in [place] hospital lab), Hosp Order     Status: None   Collection Time: 08/12/18  3:10 AM   Specimen: Nasopharyngeal Swab  Result Value Ref Range Status   SARS Coronavirus 2 NEGATIVE NEGATIVE Final    Comment: (NOTE) If result is NEGATIVE SARS-CoV-2 target nucleic acids are NOT DETECTED. The SARS-CoV-2 RNA is generally detectable in upper and lower  respiratory specimens during the acute phase of infection. The lowest  concentration of SARS-CoV-2 viral copies this assay can detect is 250  copies / mL. A negative result does not preclude SARS-CoV-2 infection  and should not be used as the  sole basis for treatment or other  patient management decisions.  A negative result may occur with  improper specimen collection / handling, submission of specimen other  than nasopharyngeal swab, presence of viral mutation(s) within the  areas targeted by this assay, and inadequate number of viral copies  (<250 copies / mL). A negative result must be combined with clinical  observations, patient history, and epidemiological information. If result is POSITIVE SARS-CoV-2 target nucleic acids are DETECTED. The SARS-CoV-2 RNA is generally detectable in upper and lower  respiratory specimens dur ing the acute phase of infection.  Positive  results are indicative of active infection with SARS-CoV-2.  Clinical  correlation with patient history and other diagnostic information is  necessary to determine patient infection status.  Positive results do  not rule out bacterial infection or co-infection with other viruses. If result is PRESUMPTIVE POSTIVE SARS-CoV-2 nucleic acids MAY BE PRESENT.   A presumptive positive result was obtained on the submitted specimen  and confirmed on repeat testing.  While 2019 novel coronavirus  (SARS-CoV-2) nucleic acids may be present in the submitted sample  additional confirmatory testing may be necessary for epidemiological  and / or clinical management purposes  to differentiate between  SARS-CoV-2 and other Sarbecovirus currently known to infect humans.  If clinically indicated additional testing with an alternate test  methodology [phone]) is advised. The SARS-CoV-2 RNA is generally  detectable in upper and lower respiratory sp ecimens during the acute  phase of infection. The expected result is Negative. Fact Sheet for Patients:  StrictlyIdeas.no Fact Sheet for Healthcare Providers: BankingDealers.co.za This test is not yet approved or cleared by the [place] FDA and has been authorized for detection  and/or diagnosis of SARS-CoV-2 by FDA under an Emergency Use Authorization (EUA).  This EUA will remain in effect (meaning this test can be used) for the duration of the COVID-19 declaration under Section 564(b)(1) of the Act, 21 U.S.C. section 360bbb-3(b)(1), unless the authorization is terminated  or revoked sooner. Performed at [facility], [place] [address] [place], [place] [address]   Novel Coronavirus, NAA (hospital order; send-out to ref lab)     Status: None   Collection Time: 08/15/18  5:01 PM   Specimen: Nasopharyngeal Swab; Respiratory  Result Value Ref Range Status   SARS-CoV-2, NAA NOT DETECTED NOT DETECTED Final    Comment: (NOTE) This test was developed and its performance characteristics determined by [employer]. This test has not been FDA cleared or approved. This test has been authorized by FDA under an Emergency Use Authorization (EUA). This test is only authorized for the duration of time the declaration that circumstances exist justifying the authorization of the emergency use of in vitro diagnostic tests for detection of SARS-CoV-2 virus and/or diagnosis of COVID-19 infection under section 564(b)(1) of the Act, 21 U.S.C. 287OMV-6(H)(2), unless the authorization is terminated or revoked sooner. When diagnostic testing is negative, the possibility of a false negative result should be considered in the context of a patient's recent exposures and the presence of clinical signs and symptoms consistent with COVID-19. An individual without symptoms of COVID-19 and who is not shedding SARS-CoV-2 virus would expect to have a negative (not detected) result in this assay. Performed  At: [facility] [address] [place], [place] [id number] Rush Farmer MD ZM:[phone]    [place]  Final    Comment: Performed at [facility], [place] [address], [place], [place] [address]         Radiology Studies: No results  found.      Scheduled Meds: .  stroke: mapping our early stages of recovery book   Does not apply Once  . aspirin EC  81 mg Oral Daily  . clopidogrel  75 mg Oral Daily  . enoxaparin (LOVENOX) injection  40 mg Subcutaneous Q24H  . insulin aspart  0-9 Units Subcutaneous TID WC  . magnesium oxide  400 mg Oral Daily  . potassium chloride  40 mEq Oral BID  . rosuvastatin  5 mg Oral Daily  . sertraline  25 mg Oral Daily   Continuous Infusions: . sodium chloride Stopped (08/16/18 0108)  . ampicillin (OMNIPEN) IV 1 g (08/18/18 0525)     LOS: 6 days    Time spent: 25 minutes spent in the coordination of care today.    Jonnie Finner, DO [facility] Pager [phone]  If 7PM-7AM, please contact night-coverage [email or web address] Password [email or web address] 08/18/2018, 7:07 AM

## 2018-08-18 NOTE — Plan of Care (Signed)
Patient stable, discussed POC with patient, agreeable with plan, denies question/concerns at this time.  

## 2018-08-18 NOTE — Care Management Important Message (Signed)
Important Message  Patient Details  Name: Harry [name] MRN: [id number] Date of Birth: [birth year]   Medicare Important Message Given:  Yes    Elanah Osmanovic [place] [address] 08/18/2018, 3:50 PM

## 2018-08-19 DIAGNOSIS — E785 Hyperlipidemia, unspecified: Secondary | ICD-10-CM | POA: Diagnosis not present

## 2018-08-19 DIAGNOSIS — G459 Transient cerebral ischemic attack, unspecified: Secondary | ICD-10-CM | POA: Diagnosis not present

## 2018-08-19 DIAGNOSIS — I161 Hypertensive emergency: Secondary | ICD-10-CM | POA: Diagnosis not present

## 2018-08-19 DIAGNOSIS — D631 Anemia in chronic kidney disease: Secondary | ICD-10-CM | POA: Diagnosis not present

## 2018-08-19 DIAGNOSIS — R5381 Other malaise: Secondary | ICD-10-CM | POA: Diagnosis not present

## 2018-08-19 DIAGNOSIS — G9389 Other specified disorders of brain: Secondary | ICD-10-CM | POA: Diagnosis not present

## 2018-08-19 DIAGNOSIS — I639 Cerebral infarction, unspecified: Secondary | ICD-10-CM | POA: Diagnosis not present

## 2018-08-19 DIAGNOSIS — D513 Other dietary vitamin B12 deficiency anemia: Secondary | ICD-10-CM | POA: Diagnosis not present

## 2018-08-19 DIAGNOSIS — R55 Syncope and collapse: Secondary | ICD-10-CM | POA: Diagnosis present

## 2018-08-19 DIAGNOSIS — E1122 Type 2 diabetes mellitus with diabetic chronic kidney disease: Secondary | ICD-10-CM | POA: Diagnosis not present

## 2018-08-19 DIAGNOSIS — Z20828 Contact with and (suspected) exposure to other viral communicable diseases: Secondary | ICD-10-CM | POA: Diagnosis not present

## 2018-08-19 DIAGNOSIS — Z7401 Bed confinement status: Secondary | ICD-10-CM | POA: Diagnosis not present

## 2018-08-19 DIAGNOSIS — N183 Chronic kidney disease, stage 3 (moderate): Secondary | ICD-10-CM | POA: Diagnosis not present

## 2018-08-19 DIAGNOSIS — F17210 Nicotine dependence, cigarettes, uncomplicated: Secondary | ICD-10-CM | POA: Diagnosis not present

## 2018-08-19 DIAGNOSIS — Z7982 Long term (current) use of aspirin: Secondary | ICD-10-CM | POA: Diagnosis not present

## 2018-08-19 DIAGNOSIS — I69359 Hemiplegia and hemiparesis following cerebral infarction affecting unspecified side: Secondary | ICD-10-CM | POA: Diagnosis not present

## 2018-08-19 DIAGNOSIS — G3183 Dementia with Lewy bodies: Secondary | ICD-10-CM | POA: Diagnosis not present

## 2018-08-19 DIAGNOSIS — H04123 Dry eye syndrome of bilateral lacrimal glands: Secondary | ICD-10-CM | POA: Diagnosis not present

## 2018-08-19 DIAGNOSIS — G909 Disorder of the autonomic nervous system, unspecified: Secondary | ICD-10-CM | POA: Diagnosis not present

## 2018-08-19 DIAGNOSIS — M4800 Spinal stenosis, site unspecified: Secondary | ICD-10-CM | POA: Diagnosis not present

## 2018-08-19 DIAGNOSIS — E1151 Type 2 diabetes mellitus with diabetic peripheral angiopathy without gangrene: Secondary | ICD-10-CM | POA: Diagnosis not present

## 2018-08-19 DIAGNOSIS — E1143 Type 2 diabetes mellitus with diabetic autonomic (poly)neuropathy: Secondary | ICD-10-CM | POA: Diagnosis present

## 2018-08-19 DIAGNOSIS — G9009 Other idiopathic peripheral autonomic neuropathy: Secondary | ICD-10-CM | POA: Diagnosis not present

## 2018-08-19 DIAGNOSIS — Z833 Family history of diabetes mellitus: Secondary | ICD-10-CM | POA: Diagnosis not present

## 2018-08-19 DIAGNOSIS — E278 Other specified disorders of adrenal gland: Secondary | ICD-10-CM | POA: Diagnosis not present

## 2018-08-19 DIAGNOSIS — F17200 Nicotine dependence, unspecified, uncomplicated: Secondary | ICD-10-CM | POA: Diagnosis not present

## 2018-08-19 DIAGNOSIS — M2550 Pain in unspecified joint: Secondary | ICD-10-CM | POA: Diagnosis not present

## 2018-08-19 DIAGNOSIS — Z7902 Long term (current) use of antithrombotics/antiplatelets: Secondary | ICD-10-CM | POA: Diagnosis not present

## 2018-08-19 DIAGNOSIS — F0390 Unspecified dementia without behavioral disturbance: Secondary | ICD-10-CM | POA: Diagnosis not present

## 2018-08-19 DIAGNOSIS — N390 Urinary tract infection, site not specified: Secondary | ICD-10-CM | POA: Diagnosis not present

## 2018-08-19 DIAGNOSIS — N179 Acute kidney failure, unspecified: Secondary | ICD-10-CM | POA: Diagnosis not present

## 2018-08-19 DIAGNOSIS — H269 Unspecified cataract: Secondary | ICD-10-CM | POA: Diagnosis not present

## 2018-08-19 DIAGNOSIS — F330 Major depressive disorder, recurrent, mild: Secondary | ICD-10-CM | POA: Diagnosis not present

## 2018-08-19 DIAGNOSIS — I129 Hypertensive chronic kidney disease with stage 1 through stage 4 chronic kidney disease, or unspecified chronic kidney disease: Secondary | ICD-10-CM | POA: Diagnosis not present

## 2018-08-19 DIAGNOSIS — F339 Major depressive disorder, recurrent, unspecified: Secondary | ICD-10-CM | POA: Diagnosis not present

## 2018-08-19 DIAGNOSIS — E860 Dehydration: Secondary | ICD-10-CM | POA: Diagnosis not present

## 2018-08-19 DIAGNOSIS — I69959 Hemiplegia and hemiparesis following unspecified cerebrovascular disease affecting unspecified side: Secondary | ICD-10-CM | POA: Diagnosis not present

## 2018-08-19 DIAGNOSIS — I10 Essential (primary) hypertension: Secondary | ICD-10-CM | POA: Diagnosis not present

## 2018-08-19 DIAGNOSIS — I739 Peripheral vascular disease, unspecified: Secondary | ICD-10-CM | POA: Diagnosis not present

## 2018-08-19 DIAGNOSIS — I951 Orthostatic hypotension: Secondary | ICD-10-CM | POA: Diagnosis not present

## 2018-08-19 DIAGNOSIS — Z743 Need for continuous supervision: Secondary | ICD-10-CM | POA: Diagnosis not present

## 2018-08-19 DIAGNOSIS — Z8249 Family history of ischemic heart disease and other diseases of the circulatory system: Secondary | ICD-10-CM | POA: Diagnosis not present

## 2018-08-19 DIAGNOSIS — R279 Unspecified lack of coordination: Secondary | ICD-10-CM | POA: Diagnosis not present

## 2018-08-19 DIAGNOSIS — M6281 Muscle weakness (generalized): Secondary | ICD-10-CM | POA: Diagnosis not present

## 2018-08-19 DIAGNOSIS — R2681 Unsteadiness on feet: Secondary | ICD-10-CM | POA: Diagnosis not present

## 2018-08-19 DIAGNOSIS — R296 Repeated falls: Secondary | ICD-10-CM | POA: Diagnosis not present

## 2018-08-19 DIAGNOSIS — Z03818 Encounter for observation for suspected exposure to other biological agents ruled out: Secondary | ICD-10-CM | POA: Diagnosis not present

## 2018-08-19 LAB — GLUCOSE, CAPILLARY
Glucose-Capillary: 79 mg/dL (ref 70–99)
Glucose-Capillary: 143 mg/dL — ABNORMAL HIGH (ref 70–99)

## 2018-08-19 MED ORDER — STROKE: EARLY STAGES OF RECOVERY BOOK: 1.0000 | Freq: Once | Status: AC

## 2018-08-19 MED ORDER — AMOXICILLIN-POT CLAVULANATE 875-125 MG PO TABS: 1.0000 | ORAL_TABLET | Freq: Two times a day (BID) | ORAL | 0 refills | Status: AC

## 2018-08-19 MED ORDER — MAGNESIUM OXIDE 400 (241.3 MG) MG PO TABS: 400.0000 mg | ORAL_TABLET | Freq: Every day | ORAL | Status: AC

## 2018-08-19 MED ORDER — AMOXICILLIN-POT CLAVULANATE 875-125 MG PO TABS
1.0000 | ORAL_TABLET | Freq: Two times a day (BID) | ORAL | Status: DC
  Administered 2018-08-19: 1 via ORAL
  Filled 2018-08-19: qty 1

## 2018-08-19 MED ORDER — INSULIN ASPART 100 UNIT/ML ~~LOC~~ SOLN: 0.0000 [IU] | Freq: Three times a day (TID) | SUBCUTANEOUS | 11 refills | Status: DC

## 2018-08-19 NOTE — TOC Transition Note (Signed)
Transition of Care [facility]) - CM/SW Discharge Note   Patient Details  Name: Harry [name] MRN: [id number] Date of Birth: 10/15/1945  Transition of Care [facility]) CM/SW Contact:  Amador Cunas, [place] Phone Number: 08/19/2018, 2:48 PM   Clinical Narrative:   Pt for d/c to [facility] today. Notified pt's dtr, [name] [phone], who reports agreeable. Spoke to [place] [facility] who confirmed they are prepared to admit pt to room 113-A (private room) today. Transport arranged for 4pm at [facility] request. RN to call report [phone]. SW signing off at d/c.     Final next level of care: Skilled Nursing Facility Barriers to Discharge: No Barriers Identified   Patient Goals and CMS Choice Patient states their goals for this hospitalization and ongoing recovery are:: to get stronger CMS Medicare.gov Compare Post Acute Care list provided to:: (Family declined list) Choice offered to / list presented to : Adult Children  Discharge Placement              Patient chooses bed at: [facility] Patient to be transferred to facility by: Ambulance/PTAR Name of family member notified: [name] Patient and family notified of of transfer: 08/19/18  Discharge Plan and Services   Discharge Planning Services: CM Consult Post Acute Care Choice: [place]                               Social Determinants of Health (SDOH) Interventions     Readmission Risk Interventions Readmission Risk Prevention Plan 08/14/2018  Transportation Screening Complete  PCP or Specialist Appt within 3-5 Days Complete  HRI or [place] Not Complete  HRI or Home Care Consult comments pt discharging to SNF  Social Work Consult for [place] Planning/Counseling Not Complete  SW consult not completed comments no needs  Palliative Care Screening Not Applicable  Medication Review (RN Care Manager) Referral to Pharmacy  Some recent data might be hidden

## 2018-08-19 NOTE — Progress Notes (Signed)
Physical Therapy Treatment Patient Details Name: Harry [name] MRN: [id number] DOB: 1946-02-05 Today's Date: 08/19/2018    History of Present Illness Pt is a 73 y/o male admitted secondary to increased dizziness and weakness. MRI revealed L PCA territory infarct. PMH includes DM, CKD, HTN, dementia, CAD s/p stent, and CVA.     PT Comments    Patient seen for mobility progression. Pt requires min/mod A for functional transfer and gait training this session. Continue to progress as tolerated with anticipated d/c to SNF for further skilled PT services.     Follow Up Recommendations  Supervision/Assistance - 24 hour;SNF     Equipment Recommendations  Other (comment)(TBD next venue)    Recommendations for Other Services       Precautions / Restrictions Precautions Precautions: Fall Restrictions Weight Bearing Restrictions: No    Mobility  Bed Mobility               General bed mobility comments: pt OOB in chair upon arrival  Transfers Overall transfer level: Needs assistance Equipment used: Rolling walker (2 wheeled) Transfers: Sit to/from Stand Sit to Stand: Mod assist         General transfer comment: assist to power up into standing; cues for safety; pt attempting to stand impulsively with no awareness of lines  Ambulation/Gait Ambulation/Gait assistance: Min assist Gait Distance (Feet): 75 Feet Assistive device: Rolling walker (2 wheeled) Gait Pattern/deviations: Step-through pattern;Trunk flexed;Decreased stride length;Drifts right/left;Decreased dorsiflexion - right;Decreased step length - right     General Gait Details: pt moving impulsively and requires assistance for balance and for safe use of AD; cues for decreasing cadence given difficulty with advancing R LE and cues for maintaining safe proximity to [employer]             Wheelchair Mobility    Modified Rankin (Stroke Patients Only) Modified Rankin (Stroke Patients Only) Pre-Morbid  Rankin Score: Moderate disability Modified Rankin: Moderately severe disability     Balance Overall balance assessment: Needs assistance Sitting-balance support: No upper extremity supported;Feet supported Sitting balance-Leahy Scale: Fair     Standing balance support: Bilateral upper extremity supported;During functional activity Standing balance-Leahy Scale: Poor                              Cognition Arousal/Alertness: Awake/alert Behavior During Therapy: WFL for tasks assessed/performed Overall Cognitive Status: History of cognitive impairments - at baseline                                 General Comments: pt is impulsive during session      Exercises      General Comments General comments (skin integrity, edema, etc.): pt c/o "wooziness" end of session      Pertinent Vitals/Pain      Home Living                      Prior Function            PT Goals (current goals can now be found in the care plan section) Progress towards PT goals: Progressing toward goals    Frequency    Min 4X/week      PT Plan Current plan remains appropriate    Co-evaluation              AM-PAC PT "6 Clicks" Mobility   Outcome  Measure  Help needed turning from your back to your side while in a flat bed without using bedrails?: None Help needed moving from lying on your back to sitting on the side of a flat bed without using bedrails?: A Little Help needed moving to and from a bed to a chair (including a wheelchair)?: A Little Help needed standing up from a chair using your arms (e.g., wheelchair or bedside chair)?: A Little Help needed to walk in hospital room?: A Lot Help needed climbing 3-5 steps with a railing? : A Lot 6 Click Score: 17    End of Session Equipment Utilized During Treatment: Gait belt Activity Tolerance: Patient tolerated treatment well Patient left: in chair;with call bell/phone within reach;with chair alarm  set Nurse Communication: Mobility status PT Visit Diagnosis: Unsteadiness on feet (R26.81);Muscle weakness (generalized) (M62.81)     Time: 1450-1501 PT Time Calculation (min) (ACUTE ONLY): 11 min  Charges:  $Gait Training: 8-22 mins                     Earney Navy, PTA Acute Rehabilitation Services Pager: [phone] Office: [phone]     Darliss Cheney 08/19/2018, 3:11 PM

## 2018-08-19 NOTE — Discharge Summary (Signed)
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 +-----------+--------+-----+---------------+---------+--------+ CFA Prox   144                         biphasic          +-----------+--------+-----+---------------+---------+--------+ DFA        85                          triphasic         +-----------+--------+-----+---------------+---------+--------+ SFA Prox   107                         biphasic plaque   +-----------+--------+-----+---------------+---------+--------+ SFA Mid    183          30-49% stenosisbiphasic plaque   +-----------+--------+-----+---------------+---------+--------+ SFA Distal 102                         biphasic plaque    +-----------+--------+-----+---------------+---------+--------+ POP Prox   119                         biphasic          +-----------+--------+-----+---------------+---------+--------+ POP Mid    95                          biphasic          +-----------+--------+-----+---------------+---------+--------+ POP Distal 51                          biphasic          +-----------+--------+-----+---------------+---------+--------+ TP Trunk   58                          biphasic          +-----------+--------+-----+---------------+---------+--------+ ATA Prox   24                                            +-----------+--------+-----+---------------+---------+--------+ ATA Mid    0            occluded                         +-----------+--------+-----+---------------+---------+--------+ ATA Distal 0            occluded                         +-----------+--------+-----+---------------+---------+--------+ PTA Distal 52                          biphasic          +-----------+--------+-----+---------------+---------+--------+ PERO Distal58                                            +-----------+--------+-----+---------------+---------+--------+ A focal velocity elevation of 183 cm/s was obtained at mid SFA with post stenotic turbulence with a VR of 1.5. Findings are characteristic of 30-49% stenosis.  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 No significant change compared to previous study.  See table(s) above for measurements and observations. See ABI report. Patient seeing Dr. Gwenlyn Found tomorrow, 08/08/2018. Suggest follow up study in 12 months. Electronically signed by Quay Burow MD on 08/08/2018 at 10:16:54 AM.    Final       Subjective: No acute events ON per nursing  Discharge Exam: Vitals:   08/18/18 2341 08/19/18 0337  BP: 110/68 (!) 149/74  Pulse: 77 75  Resp: 17 20  Temp:  (!) 97.5 F (36.4 C) 97.6 F (36.4 C)  SpO2: 98% 100%   Vitals:   08/18/18 1525 08/18/18 2024 08/18/18 2341 08/19/18 0337  BP: (!) 168/92 (!) 146/76 110/68 (!) 149/74  Pulse: 83 86 77 75  Resp: 16 16 17 20   Temp: 98.9 F (37.2 C) 98.5 F (36.9 C) (!) 97.5 F (36.4 C) 97.6 F (36.4 C)  TempSrc: Oral Oral Oral Oral  SpO2: 100% 100% 98% 100%  Weight:      Height:        General:72 y.o.maleresting in bed in NAD Cardiovascular: RRR, +S1, S2, no m/g/r, equal pulses throughout Respiratory: CTABL, no w/r/r, normal WOB GI: BS+, NDNT, no masses noted, no organomegaly noted MSK: No e/c/c Skin: No rashes, bruises, ulcerations noted    The results of significant diagnostics from this hospitalization (including imaging, microbiology, ancillary and laboratory) are listed below for reference.     Microbiology: Recent Results (from the past 240 hour(s))  Urine culture     Status: Abnormal (Preliminary result)   Collection Time: 08/11/18 11:42 PM   Specimen: Urine, Clean Catch  Result Value Ref Range Status   Specimen Description URINE, CLEAN CATCH  Final   Special Requests NONE  Final   Culture (A)  Final    >=100,000 COLONIES/mL ENTEROCOCCUS FAECALIS Sent to [place] for further susceptibility testing. Performed at [facility], [place] [address] [place], [place] [address]    Report Status PENDING  Incomplete  SARS Coronavirus 2 (CEPHEID - Performed in [place] hospital lab), Hosp Order     Status: None   Collection Time: 08/12/18  3:10 AM   Specimen: Nasopharyngeal Swab  Result Value Ref Range Status   SARS Coronavirus 2 NEGATIVE NEGATIVE Final    Comment: (NOTE) If result is NEGATIVE SARS-CoV-2 target nucleic acids are NOT DETECTED. The SARS-CoV-2 RNA is generally detectable in upper and lower  respiratory specimens during the acute phase of infection. The lowest  concentration of SARS-CoV-2 viral copies this assay can detect is 250  copies / mL. A negative result  does not preclude SARS-CoV-2 infection  and should not be used as the sole basis for treatment or other  patient management decisions.  A negative result may occur with  improper specimen collection / handling, submission of specimen other  than nasopharyngeal swab, presence of viral mutation(s) within the  areas targeted by this assay, and inadequate number of viral copies  (<250 copies / mL). A negative result must be combined with clinical  observations, patient history, and epidemiological information. If result is POSITIVE SARS-CoV-2 target nucleic acids are DETECTED. The SARS-CoV-2 RNA is generally detectable in upper and lower  respiratory specimens dur ing the acute phase of infection.  Positive  results are indicative of active infection with SARS-CoV-2.  Clinical  correlation with patient history and other diagnostic information is  necessary to determine patient infection status.  Positive results do  not rule out bacterial infection  or co-infection with other viruses. If result is PRESUMPTIVE POSTIVE SARS-CoV-2 nucleic acids MAY BE PRESENT.   A presumptive positive result was obtained on the submitted specimen  and confirmed on repeat testing.  While 2019 novel coronavirus  (SARS-CoV-2) nucleic acids may be present in the submitted sample  additional confirmatory testing may be necessary for epidemiological  and / or clinical management purposes  to differentiate between  SARS-CoV-2 and other Sarbecovirus currently known to infect humans.  If clinically indicated additional testing with an alternate test  methodology [phone]) is advised. The SARS-CoV-2 RNA is generally  detectable in upper and lower respiratory sp ecimens during the acute  phase of infection. The expected result is Negative. Fact Sheet for Patients:  StrictlyIdeas.no Fact Sheet for Healthcare Providers: BankingDealers.co.za This test is not yet approved or  cleared by the [place] FDA and has been authorized for detection and/or diagnosis of SARS-CoV-2 by FDA under an Emergency Use Authorization (EUA).  This EUA will remain in effect (meaning this test can be used) for the duration of the COVID-19 declaration under Section 564(b)(1) of the Act, 21 U.S.C. section 360bbb-3(b)(1), unless the authorization is terminated or revoked sooner. Performed at [facility], [place] [address] [place], [place] [address]   Novel Coronavirus, NAA (hospital order; send-out to ref lab)     Status: None   Collection Time: 08/15/18  5:01 PM   Specimen: Nasopharyngeal Swab; Respiratory  Result Value Ref Range Status   SARS-CoV-2, NAA NOT DETECTED NOT DETECTED Final    Comment: (NOTE) This test was developed and its performance characteristics determined by [employer]. This test has not been FDA cleared or approved. This test has been authorized by FDA under an Emergency Use Authorization (EUA). This test is only authorized for the duration of time the declaration that circumstances exist justifying the authorization of the emergency use of in vitro diagnostic tests for detection of SARS-CoV-2 virus and/or diagnosis of COVID-19 infection under section 564(b)(1) of the Act, 21 U.S.C. 614ERX-5(Q)(0), unless the authorization is terminated or revoked sooner. When diagnostic testing is negative, the possibility of a false negative result should be considered in the context of a patient's recent exposures and the presence of clinical signs and symptoms consistent with COVID-19. An individual without symptoms of COVID-19 and who is not shedding SARS-CoV-2 virus would expect to have a negative (not detected) result in this assay. Performed  At: [facility] [address] [place], [place] [id number] Rush Farmer MD DT:[id number]    [place]  Final    Comment: Performed at [facility], [place] [address] [place], [place] [address]     Labs: BNP (last 3 results) No results for input(s): BNP in the last 8760 hours. Basic Metabolic Panel: Recent Labs  Lab 08/14/18 0435 08/15/18 0820 08/16/18 1125 08/17/18 0448 08/18/18 0521  NA 140 140 137  --  137  K 3.0* 3.5 4.5  --  5.3*  CL 103 105 106  --  107  CO2 28 25 23   --  23  GLUCOSE 152* 162* 216*  --  159*  BUN 18 18 18   --  29*  CREATININE 1.53* 1.44* 1.61*  --  1.81*  CALCIUM 8.4* 8.2* 8.6*  --  8.7*  MG 1.5*  --  1.6* 1.6* 1.8  PHOS 3.2 2.3* 2.4*  --  3.0   Liver Function Tests: Recent Labs  Lab 08/14/18 0435 08/15/18 0820 08/16/18 1125 08/18/18 2505  ALBUMIN 3.1* 3.2* 3.4* 3.1*   No results for input(s): LIPASE, AMYLASE in the last 168 hours. No results for input(s): AMMONIA in the last 168 hours. CBC: Recent Labs  Lab 08/14/18 0435 08/16/18 1125 08/17/18 0448 08/18/18 0521  WBC 5.1 4.6 5.6 5.5  NEUTROABS 2.4 2.3 2.4 2.5  HGB 8.4* 8.8* 8.4* 8.2*  HCT 27.4* 28.8* 27.3* 27.2*  MCV 85.4 87.0 86.1 85.5  PLT 255 255 247 251   Cardiac Enzymes: No results for input(s): CKTOTAL, CKMB, CKMBINDEX, TROPONINI in the last 168 hours. BNP: Invalid input(s): POCBNP CBG: Recent Labs  Lab 08/18/18 0603 08/18/18 1138 08/18/18 1628 08/18/18 2154 08/19/18 0632  GLUCAP 139* 249* 201* 121* 79   D-Dimer No results for input(s): DDIMER in the last 72 hours. Hgb A1c No results for input(s): HGBA1C in the last 72 hours. Lipid Profile No results for input(s): CHOL, HDL, LDLCALC, TRIG, CHOLHDL, LDLDIRECT in the last 72 hours. Thyroid function studies Recent Labs    08/17/18 1039  TSH 1.686   Anemia work up Recent Labs    08/17/18 1039  FERRITIN 34  TIBC 332  IRON 35*   Urinalysis    Component Value Date/Time   COLORURINE YELLOW 08/11/2018 2342   APPEARANCEUR HAZY (A) 08/11/2018 2342   LABSPEC 1.016 08/11/2018 2342   PHURINE 6.0 08/11/2018 2342   GLUCOSEU 150 (A) 08/11/2018 2342   HGBUR SMALL (A) 08/11/2018  2342   [place] NEGATIVE 08/11/2018 2342   [place] 08/11/2018 2342   PROTEINUR 30 (A) 08/11/2018 2342   UROBILINOGEN 0.2 10/09/2014 2112   NITRITE NEGATIVE 08/11/2018 2342   LEUKOCYTESUR LARGE (A) 08/11/2018 2342   Sepsis Labs Invalid input(s): PROCALCITONIN,  WBC,  LACTICIDVEN Microbiology Recent Results (from the past 240 hour(s))  Urine culture     Status: Abnormal (Preliminary result)   Collection Time: 08/11/18 11:42 PM   Specimen: Urine, Clean Catch  Result Value Ref Range Status   Specimen Description URINE, CLEAN CATCH  Final   Special Requests NONE  Final   Culture (A)  Final    >=100,000 COLONIES/mL ENTEROCOCCUS FAECALIS Sent to [place] for further susceptibility testing. Performed at [facility], [place] [address], [place], [place] [address]    Report Status PENDING  Incomplete  SARS Coronavirus 2 (CEPHEID - Performed in [place] hospital lab), Hosp Order     Status: None   Collection Time: 08/12/18  3:10 AM   Specimen: Nasopharyngeal Swab  Result Value Ref Range Status   SARS Coronavirus 2 NEGATIVE NEGATIVE Final    Comment: (NOTE) If result is NEGATIVE SARS-CoV-2 target nucleic acids are NOT DETECTED. The SARS-CoV-2 RNA is generally detectable in upper and lower  respiratory specimens during the acute phase of infection. The lowest  concentration of SARS-CoV-2 viral copies this assay can detect is 250  copies / mL. A negative result does not preclude SARS-CoV-2 infection  and should not be used as the sole basis for treatment or other  patient management decisions.  A negative result may occur with  improper specimen collection / handling, submission of specimen other  than nasopharyngeal swab, presence of viral mutation(s) within the  areas targeted by this assay, and inadequate number of viral copies  (<250 copies / mL). A negative result must be combined with clinical  observations, patient history, and epidemiological information. If  result is POSITIVE SARS-CoV-2 target nucleic acids are DETECTED. The SARS-CoV-2 RNA is generally detectable in upper and lower  respiratory specimens dur ing  the acute phase of infection.  Positive  results are indicative of active infection with SARS-CoV-2.  Clinical  correlation with patient history and other diagnostic information is  necessary to determine patient infection status.  Positive results do  not rule out bacterial infection or co-infection with other viruses. If result is PRESUMPTIVE POSTIVE SARS-CoV-2 nucleic acids MAY BE PRESENT.   A presumptive positive result was obtained on the submitted specimen  and confirmed on repeat testing.  While 2019 novel coronavirus  (SARS-CoV-2) nucleic acids may be present in the submitted sample  additional confirmatory testing may be necessary for epidemiological  and / or clinical management purposes  to differentiate between  SARS-CoV-2 and other Sarbecovirus currently known to infect humans.  If clinically indicated additional testing with an alternate test  methodology [phone]) is advised. The SARS-CoV-2 RNA is generally  detectable in upper and lower respiratory sp ecimens during the acute  phase of infection. The expected result is Negative. Fact Sheet for Patients:  StrictlyIdeas.no Fact Sheet for Healthcare Providers: BankingDealers.co.za This test is not yet approved or cleared by the [place] FDA and has been authorized for detection and/or diagnosis of SARS-CoV-2 by FDA under an Emergency Use Authorization (EUA).  This EUA will remain in effect (meaning this test can be used) for the duration of the COVID-19 declaration under Section 564(b)(1) of the Act, 21 U.S.C. section 360bbb-3(b)(1), unless the authorization is terminated or revoked sooner. Performed at [facility], [place] [address] [place], [place] [address]   Novel Coronavirus, NAA (hospital order;  send-out to ref lab)     Status: None   Collection Time: 08/15/18  5:01 PM   Specimen: Nasopharyngeal Swab; Respiratory  Result Value Ref Range Status   SARS-CoV-2, NAA NOT DETECTED NOT DETECTED Final    Comment: (NOTE) This test was developed and its performance characteristics determined by [employer]. This test has not been FDA cleared or approved. This test has been authorized by FDA under an Emergency Use Authorization (EUA). This test is only authorized for the duration of time the declaration that circumstances exist justifying the authorization of the emergency use of in vitro diagnostic tests for detection of SARS-CoV-2 virus and/or diagnosis of COVID-19 infection under section 564(b)(1) of the Act, 21 U.S.C. 419QQI-2(L)(7), unless the authorization is terminated or revoked sooner. When diagnostic testing is negative, the possibility of a false negative result should be considered in the context of a patient's recent exposures and the presence of clinical signs and symptoms consistent with COVID-19. An individual without symptoms of COVID-19 and who is not shedding SARS-CoV-2 virus would expect to have a negative (not detected) result in this assay. Performed  At: [facility] [address] [place], [place] [id number] Rush Farmer MD DE:[id number]    [place]  Final    Comment: Performed at [facility], [place] [address] [place], [place] [address]     Time coordinating discharge: 35 minutes spent in the coordination of this discharge today.   SIGNED:   Jonnie Finner, DO  [facility] 08/19/2018, 7:18 AM Pager   If 7PM-7AM, please contact night-coverage [email or web address] Password [email or web address]

## 2018-08-19 NOTE — Progress Notes (Addendum)
Pt discharging to [facility] via [facility]. All belongings sent with patient/PTAR  Report called to Donna Christen, [place] at [phone].

## 2018-08-19 NOTE — Plan of Care (Signed)
Pt adequate for discharge. Leaving to go to [facility] for rehab.

## 2018-08-20 LAB — URINE CULTURE: Culture: >=100000 — AB

## 2018-08-20 LAB — SUSCEPTIBILITY RESULT

## 2018-08-20 LAB — SUSCEPTIBILITY, AER + ANAEROB

## 2018-08-21 ENCOUNTER — Other Ambulatory Visit: Payer: Self-pay | Admitting: *Deleted

## 2018-08-21 DIAGNOSIS — I739 Peripheral vascular disease, unspecified: Secondary | ICD-10-CM | POA: Diagnosis not present

## 2018-08-21 DIAGNOSIS — E1143 Type 2 diabetes mellitus with diabetic autonomic (poly)neuropathy: Secondary | ICD-10-CM | POA: Diagnosis not present

## 2018-08-21 DIAGNOSIS — I10 Essential (primary) hypertension: Secondary | ICD-10-CM | POA: Diagnosis not present

## 2018-08-21 DIAGNOSIS — I639 Cerebral infarction, unspecified: Secondary | ICD-10-CM | POA: Diagnosis not present

## 2018-08-21 DIAGNOSIS — N183 Chronic kidney disease, stage 3 (moderate): Secondary | ICD-10-CM | POA: Diagnosis not present

## 2018-08-21 NOTE — Patient Outreach (Signed)
Member assessed for potential [facility] Care Management services as a benefit of his [employer].  Member discussed in telephonic IDT meeting with [facility] therapy director and [facility] UM RN.   [name] is currently at [facility] receiving rehab therapy.  Facility reports member wants to return home at discharge.  Writer questioned whether disposition plans will be for long term. Facility to confirm with member's daughter, Harry [name], to confirm anticipated disposition plans.   Writer will continue to follow for disposition plans and progression.  Will plan to engage for [facility] Management program services when appropriate.   Will continue to collaborate with [facility] UM team and facility staff on member.    Marthenia Rolling, MSN-Ed, RN,BSN [place] Acute Care Coordinator [phone]

## 2018-08-28 ENCOUNTER — Other Ambulatory Visit: Payer: Self-pay | Admitting: *Deleted

## 2018-08-28 NOTE — Patient Outreach (Signed)
Member assessed for potential [facility] Care Management needs as a benefit of his [place] [employer].  Mr. Arrona is currently at [facility] receiving rehab therapy.  Member discussed in telephonic IDT meeting with facility staff and [place] discharge planner reports disposition plans remain to return home with daughter. States daughter works during the day and her husband is home with member during that time.   Will collaborate with discharge planner on best timing to outreach to [facility] daughter, [name], to discuss potential [place] [facility] Management services.   Will continue to follow for disposition plans and progression.   Marthenia Rolling, MSN-Ed, RN,BSN [place] Acute Care Coordinator [phone] [facility]) [phone]  (Toll free office)

## 2018-08-29 DIAGNOSIS — F330 Major depressive disorder, recurrent, mild: Secondary | ICD-10-CM | POA: Diagnosis not present

## 2018-08-29 DIAGNOSIS — F0390 Unspecified dementia without behavioral disturbance: Secondary | ICD-10-CM | POA: Diagnosis not present

## 2018-08-31 ENCOUNTER — Inpatient Hospital Stay (HOSPITAL_COMMUNITY)
Admission: EM | Admit: 2018-08-31 | Discharge: 2018-09-02 | DRG: 683 | Disposition: A | Discharge status: Skilled Nursing Facility | Payer: Medicare Other | Source: Skilled Nursing Facility | Attending: Internal Medicine | Admitting: Internal Medicine

## 2018-08-31 ENCOUNTER — Emergency Department (HOSPITAL_COMMUNITY): Payer: Medicare Other

## 2018-08-31 DIAGNOSIS — Z7982 Long term (current) use of aspirin: Secondary | ICD-10-CM

## 2018-08-31 DIAGNOSIS — I69359 Hemiplegia and hemiparesis following cerebral infarction affecting unspecified side: Secondary | ICD-10-CM

## 2018-08-31 DIAGNOSIS — Z794 Long term (current) use of insulin: Secondary | ICD-10-CM

## 2018-08-31 DIAGNOSIS — E1169 Type 2 diabetes mellitus with other specified complication: Secondary | ICD-10-CM

## 2018-08-31 DIAGNOSIS — F0390 Unspecified dementia without behavioral disturbance: Secondary | ICD-10-CM | POA: Diagnosis not present

## 2018-08-31 DIAGNOSIS — F17210 Nicotine dependence, cigarettes, uncomplicated: Secondary | ICD-10-CM | POA: Diagnosis present

## 2018-08-31 DIAGNOSIS — Z20828 Contact with and (suspected) exposure to other viral communicable diseases: Secondary | ICD-10-CM | POA: Diagnosis present

## 2018-08-31 DIAGNOSIS — N179 Acute kidney failure, unspecified: Secondary | ICD-10-CM | POA: Diagnosis not present

## 2018-08-31 DIAGNOSIS — N183 Chronic kidney disease, stage 3 unspecified: Secondary | ICD-10-CM

## 2018-08-31 DIAGNOSIS — Z79899 Other long term (current) drug therapy: Secondary | ICD-10-CM

## 2018-08-31 DIAGNOSIS — E1143 Type 2 diabetes mellitus with diabetic autonomic (poly)neuropathy: Secondary | ICD-10-CM | POA: Diagnosis not present

## 2018-08-31 DIAGNOSIS — Z888 Allergy status to other drugs, medicaments and biological substances status: Secondary | ICD-10-CM

## 2018-08-31 DIAGNOSIS — R2681 Unsteadiness on feet: Secondary | ICD-10-CM | POA: Diagnosis present

## 2018-08-31 DIAGNOSIS — I161 Hypertensive emergency: Secondary | ICD-10-CM | POA: Diagnosis present

## 2018-08-31 DIAGNOSIS — R55 Syncope and collapse: Secondary | ICD-10-CM | POA: Diagnosis not present

## 2018-08-31 DIAGNOSIS — N189 Chronic kidney disease, unspecified: Secondary | ICD-10-CM | POA: Diagnosis present

## 2018-08-31 DIAGNOSIS — N1831 Chronic kidney disease, stage 3a: Secondary | ICD-10-CM | POA: Diagnosis present

## 2018-08-31 DIAGNOSIS — Z8249 Family history of ischemic heart disease and other diseases of the circulatory system: Secondary | ICD-10-CM

## 2018-08-31 DIAGNOSIS — I69959 Hemiplegia and hemiparesis following unspecified cerebrovascular disease affecting unspecified side: Secondary | ICD-10-CM

## 2018-08-31 DIAGNOSIS — Z03818 Encounter for observation for suspected exposure to other biological agents ruled out: Secondary | ICD-10-CM | POA: Diagnosis not present

## 2018-08-31 DIAGNOSIS — I129 Hypertensive chronic kidney disease with stage 1 through stage 4 chronic kidney disease, or unspecified chronic kidney disease: Secondary | ICD-10-CM | POA: Diagnosis present

## 2018-08-31 DIAGNOSIS — E1151 Type 2 diabetes mellitus with diabetic peripheral angiopathy without gangrene: Secondary | ICD-10-CM | POA: Diagnosis present

## 2018-08-31 DIAGNOSIS — E860 Dehydration: Secondary | ICD-10-CM | POA: Diagnosis present

## 2018-08-31 DIAGNOSIS — Z833 Family history of diabetes mellitus: Secondary | ICD-10-CM

## 2018-08-31 DIAGNOSIS — E1122 Type 2 diabetes mellitus with diabetic chronic kidney disease: Secondary | ICD-10-CM | POA: Diagnosis present

## 2018-08-31 DIAGNOSIS — E785 Hyperlipidemia, unspecified: Secondary | ICD-10-CM | POA: Diagnosis present

## 2018-08-31 DIAGNOSIS — G909 Disorder of the autonomic nervous system, unspecified: Secondary | ICD-10-CM | POA: Diagnosis present

## 2018-08-31 DIAGNOSIS — I951 Orthostatic hypotension: Secondary | ICD-10-CM | POA: Diagnosis present

## 2018-08-31 DIAGNOSIS — Z7902 Long term (current) use of antithrombotics/antiplatelets: Secondary | ICD-10-CM

## 2018-08-31 DIAGNOSIS — G9389 Other specified disorders of brain: Secondary | ICD-10-CM | POA: Diagnosis not present

## 2018-08-31 DIAGNOSIS — F17200 Nicotine dependence, unspecified, uncomplicated: Secondary | ICD-10-CM | POA: Diagnosis present

## 2018-08-31 LAB — URINALYSIS, ROUTINE W REFLEX MICROSCOPIC
Bilirubin Urine: NEGATIVE
Glucose, UA: 150 mg/dL — AB
Ketones, ur: NEGATIVE mg/dL
Leukocytes,Ua: NEGATIVE
Nitrite: NEGATIVE
Protein, ur: NEGATIVE mg/dL
Specific Gravity, Urine: 1.011 (ref 1.005–1.030)
pH: 5 (ref 5.0–8.0)

## 2018-08-31 LAB — COMPREHENSIVE METABOLIC PANEL
ALT: 15 U/L (ref 0–44)
AST: 17 U/L (ref 15–41)
Albumin: 3.6 g/dL (ref 3.5–5.0)
Alkaline Phosphatase: 63 U/L (ref 38–126)
Anion gap: 10 (ref 5–15)
BUN: 27 mg/dL — ABNORMAL HIGH (ref 8–23)
CO2: 24 mmol/L (ref 22–32)
Calcium: 9.4 mg/dL (ref 8.9–10.3)
Chloride: 107 mmol/L (ref 98–111)
Creatinine, Ser: 2.61 mg/dL — ABNORMAL HIGH (ref 0.61–1.24)
GFR calc Af Amer: 27 mL/min — ABNORMAL LOW (ref >60)
GFR calc non Af Amer: 24 mL/min — ABNORMAL LOW (ref >60)
Glucose, Bld: 65 mg/dL — ABNORMAL LOW (ref 70–99)
Potassium: 3.7 mmol/L (ref 3.5–5.1)
Sodium: 141 mmol/L (ref 135–145)
Total Bilirubin: 0.5 mg/dL (ref 0.3–1.2)
Total Protein: 7.1 g/dL (ref 6.5–8.1)

## 2018-08-31 LAB — CBC WITH DIFFERENTIAL/PLATELET
Abs Immature Granulocytes: 0.03 10*3/uL (ref 0.00–0.07)
Basophils Absolute: 0 10*3/uL (ref 0.0–0.1)
Basophils Relative: 0 %
Eosinophils Absolute: 0.1 10*3/uL (ref 0.0–0.5)
Eosinophils Relative: 3 %
HCT: 29.2 % — ABNORMAL LOW (ref 39.0–52.0)
Hemoglobin: 8.7 g/dL — ABNORMAL LOW (ref 13.0–17.0)
Immature Granulocytes: 1 %
Lymphocytes Relative: 38 %
Lymphs Abs: 2.1 10*3/uL (ref 0.7–4.0)
MCH: 26 pg (ref 26.0–34.0)
MCHC: 29.8 g/dL — ABNORMAL LOW (ref 30.0–36.0)
MCV: 87.2 fL (ref 80.0–100.0)
Monocytes Absolute: 0.7 10*3/uL (ref 0.1–1.0)
Monocytes Relative: 13 %
Neutro Abs: 2.5 10*3/uL (ref 1.7–7.7)
Neutrophils Relative %: 45 %
Platelets: 279 10*3/uL (ref 150–400)
RBC: 3.35 MIL/uL — ABNORMAL LOW (ref 4.22–5.81)
RDW: 16 % — ABNORMAL HIGH (ref 11.5–15.5)
WBC: 5.5 10*3/uL (ref 4.0–10.5)
nRBC: 0 % (ref 0.0–0.2)

## 2018-08-31 LAB — CBG MONITORING, ED: Glucose-Capillary: 68 mg/dL — ABNORMAL LOW (ref 70–99)

## 2018-08-31 LAB — RAPID URINE DRUG SCREEN, HOSP PERFORMED
Amphetamines: NOT DETECTED
Barbiturates: NOT DETECTED
Benzodiazepines: NOT DETECTED
Cocaine: NOT DETECTED
Opiates: NOT DETECTED
Tetrahydrocannabinol: NOT DETECTED

## 2018-08-31 LAB — SARS CORONAVIRUS 2 BY RT PCR (HOSPITAL ORDER, PERFORMED IN ~~LOC~~ HOSPITAL LAB): SARS Coronavirus 2: NEGATIVE

## 2018-08-31 LAB — TROPONIN I (HIGH SENSITIVITY)
Troponin I (High Sensitivity): 6 ng/L (ref <18)
Troponin I (High Sensitivity): 10 ng/L (ref <18)

## 2018-08-31 MED ORDER — NICOTINE 21 MG/24HR TD PT24
21.0000 mg | MEDICATED_PATCH | Freq: Every day | TRANSDERMAL | Status: DC
  Administered 2018-09-01 – 2018-09-02 (×2): 21 mg via TRANSDERMAL
  Filled 2018-08-31 (×2): qty 1

## 2018-08-31 MED ORDER — SODIUM CHLORIDE 0.9 % IV BOLUS
1000.0000 mL | Freq: Once | INTRAVENOUS | Status: AC
  Administered 2018-08-31: 1000 mL via INTRAVENOUS

## 2018-08-31 NOTE — ED Notes (Signed)
Meal given to pt.

## 2018-08-31 NOTE — ED Notes (Signed)
Attempted blood draw x2 unsuccessful. Informed RN Abbe [place]

## 2018-08-31 NOTE — ED Notes (Signed)
Patient transported to X-ray 
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
## 2018-08-31 NOTE — ED Notes (Signed)
Patient transported to CT 
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
## 2018-08-31 NOTE — ED Notes (Signed)
Pt's CBG result was 68. Informed Louie - RN.

## 2018-08-31 NOTE — ED Notes (Signed)
Family updated.
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
## 2018-09-01 ENCOUNTER — Other Ambulatory Visit: Payer: Self-pay

## 2018-09-01 DIAGNOSIS — R55 Syncope and collapse: Secondary | ICD-10-CM

## 2018-09-01 LAB — COMPREHENSIVE METABOLIC PANEL
ALT: 16 U/L (ref 0–44)
AST: 23 U/L (ref 15–41)
Albumin: 3.5 g/dL (ref 3.5–5.0)
Alkaline Phosphatase: 64 U/L (ref 38–126)
Anion gap: 12 (ref 5–15)
BUN: 23 mg/dL (ref 8–23)
CO2: 21 mmol/L — ABNORMAL LOW (ref 22–32)
Calcium: 8.8 mg/dL — ABNORMAL LOW (ref 8.9–10.3)
Chloride: 105 mmol/L (ref 98–111)
Creatinine, Ser: 1.84 mg/dL — ABNORMAL HIGH (ref 0.61–1.24)
GFR calc Af Amer: 42 mL/min — ABNORMAL LOW (ref >60)
GFR calc non Af Amer: 36 mL/min — ABNORMAL LOW (ref >60)
Glucose, Bld: 215 mg/dL — ABNORMAL HIGH (ref 70–99)
Potassium: 4.2 mmol/L (ref 3.5–5.1)
Sodium: 138 mmol/L (ref 135–145)
Total Bilirubin: 0.6 mg/dL (ref 0.3–1.2)
Total Protein: 7.1 g/dL (ref 6.5–8.1)

## 2018-09-01 LAB — CBC WITH DIFFERENTIAL/PLATELET
Abs Immature Granulocytes: 0.01 10*3/uL (ref 0.00–0.07)
Basophils Absolute: 0 10*3/uL (ref 0.0–0.1)
Basophils Relative: 1 %
Eosinophils Absolute: 0.1 10*3/uL (ref 0.0–0.5)
Eosinophils Relative: 1 %
HCT: 30.1 % — ABNORMAL LOW (ref 39.0–52.0)
Hemoglobin: 9.2 g/dL — ABNORMAL LOW (ref 13.0–17.0)
Immature Granulocytes: 0 %
Lymphocytes Relative: 33 %
Lymphs Abs: 2.1 10*3/uL (ref 0.7–4.0)
MCH: 26.3 pg (ref 26.0–34.0)
MCHC: 30.6 g/dL (ref 30.0–36.0)
MCV: 86 fL (ref 80.0–100.0)
Monocytes Absolute: 0.6 10*3/uL (ref 0.1–1.0)
Monocytes Relative: 9 %
Neutro Abs: 3.5 10*3/uL (ref 1.7–7.7)
Neutrophils Relative %: 56 %
Platelets: 283 10*3/uL (ref 150–400)
RBC: 3.5 MIL/uL — ABNORMAL LOW (ref 4.22–5.81)
RDW: 16.2 % — ABNORMAL HIGH (ref 11.5–15.5)
WBC: 6.3 10*3/uL (ref 4.0–10.5)
nRBC: 0 % (ref 0.0–0.2)

## 2018-09-01 LAB — TSH: TSH: 0.813 u[IU]/mL (ref 0.350–4.500)

## 2018-09-01 LAB — URINE CULTURE

## 2018-09-01 LAB — GLUCOSE, CAPILLARY
Glucose-Capillary: 191 mg/dL — ABNORMAL HIGH (ref 70–99)
Glucose-Capillary: 257 mg/dL — ABNORMAL HIGH (ref 70–99)
Glucose-Capillary: 287 mg/dL — ABNORMAL HIGH (ref 70–99)

## 2018-09-01 MED ORDER — SODIUM CHLORIDE 0.9 % IV SOLN
INTRAVENOUS | Status: DC
  Administered 2018-09-01: 02:00:00 via INTRAVENOUS

## 2018-09-01 MED ORDER — POLYVINYL ALCOHOL 1.4 % OP SOLN
1.0000 [drp] | Freq: Two times a day (BID) | OPHTHALMIC | Status: DC | PRN
  Filled 2018-09-01: qty 15

## 2018-09-01 MED ORDER — SODIUM CHLORIDE 0.9% FLUSH
3.0000 mL | Freq: Two times a day (BID) | INTRAVENOUS | Status: DC
  Administered 2018-09-01 – 2018-09-02 (×4): 3 mL via INTRAVENOUS

## 2018-09-01 MED ORDER — HYDRALAZINE HCL 20 MG/ML IJ SOLN: 5.0000 mg | Freq: Four times a day (QID) | INTRAMUSCULAR | Status: DC | PRN

## 2018-09-01 MED ORDER — SERTRALINE HCL 50 MG PO TABS
25.0000 mg | ORAL_TABLET | Freq: Every day | ORAL | Status: DC
  Administered 2018-09-01 – 2018-09-02 (×2): 25 mg via ORAL
  Filled 2018-09-01 (×2): qty 1

## 2018-09-01 MED ORDER — ACETAMINOPHEN 500 MG PO TABS: 1000.0000 mg | ORAL_TABLET | Freq: Four times a day (QID) | ORAL | Status: DC | PRN

## 2018-09-01 MED ORDER — ROSUVASTATIN CALCIUM 5 MG PO TABS
5.0000 mg | ORAL_TABLET | Freq: Every day | ORAL | Status: DC
  Administered 2018-09-01 (×2): 5 mg via ORAL
  Filled 2018-09-01 (×2): qty 1

## 2018-09-01 MED ORDER — ENOXAPARIN SODIUM 40 MG/0.4ML ~~LOC~~ SOLN
40.0000 mg | SUBCUTANEOUS | Status: DC
  Administered 2018-09-01 – 2018-09-02 (×2): 40 mg via SUBCUTANEOUS
  Filled 2018-09-01 (×2): qty 0.4

## 2018-09-01 MED ORDER — ONDANSETRON HCL 4 MG/2ML IJ SOLN: 4.0000 mg | Freq: Four times a day (QID) | INTRAMUSCULAR | Status: DC | PRN

## 2018-09-01 MED ORDER — PYRIDOSTIGMINE BROMIDE 60 MG PO TABS
30.0000 mg | ORAL_TABLET | Freq: Three times a day (TID) | ORAL | Status: DC
  Administered 2018-09-01 – 2018-09-02 (×3): 30 mg via ORAL
  Filled 2018-09-01 (×5): qty 0.5

## 2018-09-01 MED ORDER — ONDANSETRON HCL 4 MG PO TABS: 4.0000 mg | ORAL_TABLET | Freq: Four times a day (QID) | ORAL | Status: DC | PRN

## 2018-09-01 MED ORDER — CLOPIDOGREL BISULFATE 75 MG PO TABS
75.0000 mg | ORAL_TABLET | Freq: Every day | ORAL | Status: DC
  Administered 2018-09-01 – 2018-09-02 (×2): 75 mg via ORAL
  Filled 2018-09-01 (×2): qty 1

## 2018-09-01 MED ORDER — ASPIRIN 81 MG PO CHEW
81.0000 mg | CHEWABLE_TABLET | Freq: Every day | ORAL | Status: DC
  Administered 2018-09-01 – 2018-09-02 (×2): 81 mg via ORAL
  Filled 2018-09-01 (×2): qty 1

## 2018-09-01 MED ORDER — VITAMIN B-12 1000 MCG PO TABS
1000.0000 ug | ORAL_TABLET | Freq: Every day | ORAL | Status: DC
  Administered 2018-09-01 – 2018-09-02 (×2): 1000 ug via ORAL
  Filled 2018-09-01 (×2): qty 1

## 2018-09-01 MED ORDER — INSULIN ASPART 100 UNIT/ML ~~LOC~~ SOLN
0.0000 [IU] | Freq: Three times a day (TID) | SUBCUTANEOUS | Status: DC
  Administered 2018-09-01: 2 [IU] via SUBCUTANEOUS
  Administered 2018-09-01 – 2018-09-02 (×2): 5 [IU] via SUBCUTANEOUS
  Administered 2018-09-02: 2 [IU] via SUBCUTANEOUS

## 2018-09-01 NOTE — Progress Notes (Signed)
[facility] PROGRESS NOTE  Harry [name] [id number]:[id number] DOB: November 28, 1945 DOA: 08/31/2018 PCP: [name], MD  Assessment/Plan:  #1 syncope: hx orthostatic hypotensions. Lying SBP 200. Standing drops to 90. Chart review indicated has been on Pyridostigmine in past. Daughter stated "I think it made him drowsy".  She reports "trying to manage this blood pressure issue for 2 years". May also be some dehydration with possible acute kidney injury contributing as well.  Patient may also have had vasovagal. No events on tele. No s/sx infection. No metabolic derangements.  -stop IV fluids -follow echo results -TED hose -recheck orthostatic -re-try pyridostigmine  #2 diabetes: A1c 8.2 this month. -Continue home regimen. -monitor  #3 acute on chronic kidney failure: Patient appears only slightly dehydrated. Creatinine 2.6 on admission. Baseline appears to be 1.4-1.6 range. Creatinine this am 1.8. -will decrease fluids -avoid nephrotoxins -monitor urine output  #4 recent CVA: PT and OT. Went to snf discharge 08/12/18. Will return to [employer]  #5 hypertensive emergency:  BP 166/106. Home regimen includes no antihypertensive meds given above.  -monitor  #6 mild dementia: Patient fully awake alert now. Stable at baseline  Continue all home regimen.  #7 hyperlipidemia: Continue with statin.   Code Status: full Family Communication: daughter at home Disposition Plan: back to [employer]:    Procedures:    Antibiotics:    HPI/Subjective: 73 yo hx cva, dementia, orthostatic hypotension admitted for syncope. Echo pending. Provided with iv fluids.   Objective: Vitals:   09/01/18 0500 09/01/18 0625  BP:  (!) 215/90  Pulse:  90  Resp:    Temp: 98 F (36.7 C)   SpO2:      Intake/Output Summary (Last 24 hours) at 09/01/2018 0910 Last data filed at 09/01/2018 0700 Gross per 24 hour  Intake -  Output 700 ml  Net -700 ml   Filed  Weights   09/01/18 0211 09/01/18 0242  Weight: 80 kg 79.9 kg    Exam:   General:  Awake alert no acute distress  Cardiovascular: rrr no mgr no LE edema  Respiratory: normal effort BS clear bilaterally no wheeze  Abdomen: soft non-distended non-tender +BS no guarding or rebounding  Musculoskeletal: joints without swelling/erythema   Data Reviewed: Basic Metabolic Panel: Recent Labs  Lab 08/31/18 1847 09/01/18 0331  NA 141 138  K 3.7 4.2  CL 107 105  CO2 24 21*  GLUCOSE 65* 215*  BUN 27* 23  CREATININE 2.61* 1.84*  CALCIUM 9.4 8.8*   Liver Function Tests: Recent Labs  Lab 08/31/18 1847 09/01/18 0331  AST 17 23  ALT 15 16  ALKPHOS 63 64  BILITOT 0.5 0.6  PROT 7.1 7.1  ALBUMIN 3.6 3.5   No results for input(s): LIPASE, AMYLASE in the last 168 hours. No results for input(s): AMMONIA in the last 168 hours. CBC: Recent Labs  Lab 08/31/18 1847 09/01/18 0331  WBC 5.5 6.3  NEUTROABS 2.5 3.5  HGB 8.7* 9.2*  HCT 29.2* 30.1*  MCV 87.2 86.0  PLT 279 283   Cardiac Enzymes: No results for input(s): CKTOTAL, CKMB, CKMBINDEX, TROPONINI in the last 168 hours. BNP (last 3 results) No results for input(s): BNP in the last 8760 hours.  ProBNP (last 3 results) No results for input(s): PROBNP in the last 8760 hours.  CBG: Recent Labs  Lab 08/31/18 1835 09/01/18 0540  GLUCAP 68* 191*    Recent Results (from the past 240 hour(s))  SARS Coronavirus 2 (CEPHEID- Performed in [facility] hospital lab), Hosp Order     Status: None   Collection Time: 08/31/18 10:45 PM   Specimen: Nasopharyngeal Swab  Result Value Ref Range Status   SARS Coronavirus 2 NEGATIVE NEGATIVE Final    Comment: (NOTE) If result is NEGATIVE SARS-CoV-2 target nucleic acids are NOT DETECTED. The SARS-CoV-2 RNA is generally detectable in upper and lower  respiratory specimens during the acute phase of infection. The lowest  concentration of SARS-CoV-2 viral copies this assay can detect is 250   copies / mL. A negative result does not preclude SARS-CoV-2 infection  and should not be used as the sole basis for treatment or other  patient management decisions.  A negative result may occur with  improper specimen collection / handling, submission of specimen other  than nasopharyngeal swab, presence of viral mutation(s) within the  areas targeted by this assay, and inadequate number of viral copies  (<250 copies / mL). A negative result must be combined with clinical  observations, patient history, and epidemiological information. If result is POSITIVE SARS-CoV-2 target nucleic acids are DETECTED. The SARS-CoV-2 RNA is generally detectable in upper and lower  respiratory specimens dur ing the acute phase of infection.  Positive  results are indicative of active infection with SARS-CoV-2.  Clinical  correlation with patient history and other diagnostic information is  necessary to determine patient infection status.  Positive results do  not rule out bacterial infection or co-infection with other viruses. If result is PRESUMPTIVE POSTIVE SARS-CoV-2 nucleic acids MAY BE PRESENT.   A presumptive positive result was obtained on the submitted specimen  and confirmed on repeat testing.  While 2019 novel coronavirus  (SARS-CoV-2) nucleic acids may be present in the submitted sample  additional confirmatory testing may be necessary for epidemiological  and / or clinical management purposes  to differentiate between  SARS-CoV-2 and other Sarbecovirus currently known to infect humans.  If clinically indicated additional testing with an alternate test  methodology [phone]) is advised. The SARS-CoV-2 RNA is generally  detectable in upper and lower respiratory sp ecimens during the acute  phase of infection. The expected result is Negative. Fact Sheet for Patients:  StrictlyIdeas.no Fact Sheet for Healthcare  Providers: BankingDealers.co.za This test is not yet approved or cleared by the [place] FDA and has been authorized for detection and/or diagnosis of SARS-CoV-2 by FDA under an Emergency Use Authorization (EUA).  This EUA will remain in effect (meaning this test can be used) for the duration of the COVID-19 declaration under Section 564(b)(1) of the Act, 21 U.S.C. section 360bbb-3(b)(1), unless the authorization is terminated or revoked sooner. Performed at [facility], [place] [address], [place], [place] [address]      Studies: Dg Chest 2 View  Result Date: 08/31/2018 CLINICAL DATA:  Syncope EXAM: CHEST - 2 VIEW COMPARISON:  08/12/2018 FINDINGS: Heart and mediastinal contours are within normal limits. No focal opacities or effusions. No acute bony abnormality. IMPRESSION: No active cardiopulmonary disease. Electronically Signed   By: Rolm Baptise M.D.   On: 08/31/2018 19:22   Ct Head Wo Contrast  Result Date: 08/31/2018 CLINICAL DATA:  Altered level of consciousness EXAM: CT HEAD WITHOUT CONTRAST TECHNIQUE: Contiguous axial images were obtained from the base of the skull through the vertex without intravenous contrast. COMPARISON:  None. FINDINGS: Brain: No evidence of acute infarction, hemorrhage, hydrocephalus, extra-axial collection or mass lesion/mass effect. Periventricular white matter hypodensity. Multifocal encephalomalacia involving occipital lobes and bilateral cerebellar hemispheres. Vascular: No hyperdense vessel  or unexpected calcification. Skull: Normal. Negative for fracture or focal lesion. Sinuses/Orbits: No acute finding. Other: None. IMPRESSION: No acute intracranial pathology. Periventricular white matter hypodensity. Multifocal encephalomalacia involving the occipital lobes and bilateral cerebellar hemispheres, unchanged from prior. Electronically Signed   By: Eddie Candle M.D.   On: 08/31/2018 19:46    Scheduled Meds: . aspirin  81 mg  Oral Daily  . clopidogrel  75 mg Oral Daily  . enoxaparin (LOVENOX) injection  40 mg Subcutaneous Q24H  . insulin aspart  0-9 Units Subcutaneous TID WC  . nicotine  21 mg Transdermal Daily  . rosuvastatin  5 mg Oral QHS  . sertraline  25 mg Oral Daily  . sodium chloride flush  3 mL Intravenous Q12H  . cyanocobalamin  1,000 mcg Oral Daily   Continuous Infusions: . sodium chloride 100 mL/hr at 09/01/18 0210    Principal Problem:   Syncope and collapse Active Problems:   Hypertensive emergency   Acute renal failure superimposed on stage 3 chronic kidney disease (HCC)   Hyperlipidemia LDL goal <70   Hemiplegia, late effect of cerebrovascular disease ([place])   Dementia without behavioral disturbance (HCC)   Type 2 diabetes mellitus with stage 3 chronic kidney disease, with long-term current use of insulin (HCC)   TOBACCO USE    Time spent: 45 minutes    [facility] M  [facility]  If 7PM-7AM, please contact night-coverage at [email or web address], password [facility] 09/01/2018, 9:10 AM  LOS: 0 days
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
## 2018-09-01 NOTE — Care Management Obs Status (Signed)
[place] NOTIFICATION   Patient Details  Name: Harry [name] MRN: [id number] Date of Birth: 13-Jun-1945   Medicare Observation Status Notification Given:  Yes(Emailed to daughter.)    Candie Chroman, LCSW 09/01/2018, 4:01 PM

## 2018-09-01 NOTE — Evaluation (Signed)
Physical Therapy Evaluation Patient Details Name: Harry [name] MRN: [id number] DOB: [birth year] Today's Date: 09/01/2018   History of Present Illness  Pt is a 73 y/o male admitted secondary to syncopal episode at SNF, likely due to orthostatic hypotension. Pt with recent admission secondary to CVA. PMH includes DM, CKD, HTN, dementia, CAD and CVA.   Clinical Impression  Pt admitted secondary to problem above with deficits below. Pt limited this session secondary to positive orthostatics. Pt initially hypertensive in supine and sitting, however, BP dropped tremendously in standing. Required mod A to stand with use of RW. Pt was at [facility] prior to admission for rehab following CVA. Recommend return to SNF at d/c given current deficits. Will continue to follow acutely to maximize functional mobility independence and safety.   Orthostatic:  Supine: 197/102 mmHg; 97 bpm Sitting: 205/103 mmHg; 98 bpm Standing: 110/62 mmHg; 92 bpm Standing at 3 mins: 88/56 mmHg; 93 bpm    Follow Up Recommendations SNF;Supervision/Assistance - 24 hour    Equipment Recommendations  Rolling walker with 5" wheels    Recommendations for Other Services       Precautions / Restrictions Precautions Precautions: Fall;Other (comment) Precaution Comments: Watch BP  Restrictions Weight Bearing Restrictions: No      Mobility  Bed Mobility Overal bed mobility: Needs Assistance Bed Mobility: Supine to Sit;Sit to Supine     Supine to sit: Min guard Sit to supine: Supervision   General bed mobility comments: Min guard for safety during transfer. Required supervision for return to supine.   Transfers Overall transfer level: Needs assistance Equipment used: Rolling walker (2 wheeled) Transfers: Sit to/from Stand Sit to Stand: Mod assist         General transfer comment: Mod A for lift assist and steadying to stand. Pt with positive orthostatics, so further mobility deferred. See vitals  flowsheet.   Ambulation/Gait                Stairs            Wheelchair Mobility    Modified Rankin (Stroke Patients Only)       Balance Overall balance assessment: Needs assistance Sitting-balance support: No upper extremity supported;Feet supported Sitting balance-Leahy Scale: Fair     Standing balance support: Bilateral upper extremity supported;During functional activity Standing balance-Leahy Scale: Poor Standing balance comment: Reliant on BUE suport                              Pertinent Vitals/Pain Pain Assessment: No/denies pain    Home Living Family/patient expects to be discharged to:: Skilled nursing facility                 Additional Comments: Was at [facility] PTA for rehab.     Prior Function Level of Independence: Needs assistance   Gait / Transfers Assistance Needed: Was working with PT on ambulating with RW. Otherwise required use of WC for mobility.   ADL's / Homemaking Assistance Needed: Required assist from staff for ADLs.         Hand Dominance        Extremity/Trunk Assessment   Upper Extremity Assessment Upper Extremity Assessment: Generalized weakness    Lower Extremity Assessment Lower Extremity Assessment: Generalized weakness    Cervical / Trunk Assessment Cervical / Trunk Assessment: Kyphotic  Communication   Communication: HOH  Cognition Arousal/Alertness: Awake/alert Behavior During Therapy: WFL for tasks assessed/performed Overall Cognitive Status: History of  cognitive impairments - at baseline                                        General Comments      Exercises     Assessment/Plan    PT Assessment Patient needs continued PT services  PT Problem List Decreased strength;Decreased balance;Decreased mobility;Decreased activity tolerance;Decreased cognition;Decreased knowledge of use of DME;Decreased safety awareness;Decreased knowledge of precautions        PT Treatment Interventions DME instruction;Gait training;Stair training;Functional mobility training;Therapeutic activities;Therapeutic exercise;Balance training;Patient/family education;Cognitive remediation    PT Goals (Current goals can be found in the Care Plan section)  Acute Rehab PT Goals Patient Stated Goal: to feel better PT Goal Formulation: With patient Time For Goal Achievement: 09/15/18 Potential to Achieve Goals: Good    Frequency Min 2X/week   Barriers to discharge        Co-evaluation               AM-PAC PT "6 Clicks" Mobility  Outcome Measure Help needed turning from your back to your side while in a flat bed without using bedrails?: A Little Help needed moving from lying on your back to sitting on the side of a flat bed without using bedrails?: A Little Help needed moving to and from a bed to a chair (including a wheelchair)?: A Lot Help needed standing up from a chair using your arms (e.g., wheelchair or bedside chair)?: A Lot Help needed to walk in hospital room?: A Lot Help needed climbing 3-5 steps with a railing? : A Lot 6 Click Score: 14    End of Session Equipment Utilized During Treatment: Gait belt Activity Tolerance: Treatment limited secondary to medical complications (Comment)(positive orthostatics) Patient left: in bed;with call bell/phone within reach;with bed alarm set Nurse Communication: Mobility status PT Visit Diagnosis: Unsteadiness on feet (R26.81);Muscle weakness (generalized) (M62.81)    Time: 3748-2707 PT Time Calculation (min) (ACUTE ONLY): 18 min   Charges:   PT Evaluation $PT Eval Moderate Complexity: Bowlby Deer, PT, DPT  Acute Rehabilitation Services  Pager: [phone] Office: [phone]   Rudean Hitt 09/01/2018, 11:55 AM
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
## 2018-09-02 DIAGNOSIS — G909 Disorder of the autonomic nervous system, unspecified: Secondary | ICD-10-CM | POA: Diagnosis not present

## 2018-09-02 DIAGNOSIS — Z7902 Long term (current) use of antithrombotics/antiplatelets: Secondary | ICD-10-CM | POA: Diagnosis not present

## 2018-09-02 DIAGNOSIS — F0390 Unspecified dementia without behavioral disturbance: Secondary | ICD-10-CM | POA: Diagnosis present

## 2018-09-02 DIAGNOSIS — F17210 Nicotine dependence, cigarettes, uncomplicated: Secondary | ICD-10-CM | POA: Diagnosis present

## 2018-09-02 DIAGNOSIS — M2550 Pain in unspecified joint: Secondary | ICD-10-CM | POA: Diagnosis not present

## 2018-09-02 DIAGNOSIS — N179 Acute kidney failure, unspecified: Secondary | ICD-10-CM | POA: Diagnosis present

## 2018-09-02 DIAGNOSIS — E1151 Type 2 diabetes mellitus with diabetic peripheral angiopathy without gangrene: Secondary | ICD-10-CM | POA: Diagnosis present

## 2018-09-02 DIAGNOSIS — I129 Hypertensive chronic kidney disease with stage 1 through stage 4 chronic kidney disease, or unspecified chronic kidney disease: Secondary | ICD-10-CM | POA: Diagnosis present

## 2018-09-02 DIAGNOSIS — I951 Orthostatic hypotension: Secondary | ICD-10-CM | POA: Diagnosis present

## 2018-09-02 DIAGNOSIS — Z8249 Family history of ischemic heart disease and other diseases of the circulatory system: Secondary | ICD-10-CM | POA: Diagnosis not present

## 2018-09-02 DIAGNOSIS — N183 Chronic kidney disease, stage 3 (moderate): Secondary | ICD-10-CM | POA: Diagnosis present

## 2018-09-02 DIAGNOSIS — E860 Dehydration: Secondary | ICD-10-CM | POA: Diagnosis present

## 2018-09-02 DIAGNOSIS — I161 Hypertensive emergency: Secondary | ICD-10-CM | POA: Diagnosis present

## 2018-09-02 DIAGNOSIS — E785 Hyperlipidemia, unspecified: Secondary | ICD-10-CM | POA: Diagnosis present

## 2018-09-02 DIAGNOSIS — Z7401 Bed confinement status: Secondary | ICD-10-CM | POA: Diagnosis not present

## 2018-09-02 DIAGNOSIS — Z7982 Long term (current) use of aspirin: Secondary | ICD-10-CM | POA: Diagnosis not present

## 2018-09-02 DIAGNOSIS — R55 Syncope and collapse: Secondary | ICD-10-CM | POA: Diagnosis present

## 2018-09-02 DIAGNOSIS — R5381 Other malaise: Secondary | ICD-10-CM | POA: Diagnosis not present

## 2018-09-02 DIAGNOSIS — Z20828 Contact with and (suspected) exposure to other viral communicable diseases: Secondary | ICD-10-CM | POA: Diagnosis present

## 2018-09-02 DIAGNOSIS — Z833 Family history of diabetes mellitus: Secondary | ICD-10-CM | POA: Diagnosis not present

## 2018-09-02 DIAGNOSIS — E1122 Type 2 diabetes mellitus with diabetic chronic kidney disease: Secondary | ICD-10-CM | POA: Diagnosis present

## 2018-09-02 DIAGNOSIS — E1143 Type 2 diabetes mellitus with diabetic autonomic (poly)neuropathy: Secondary | ICD-10-CM | POA: Diagnosis present

## 2018-09-02 DIAGNOSIS — I69359 Hemiplegia and hemiparesis following cerebral infarction affecting unspecified side: Secondary | ICD-10-CM | POA: Diagnosis not present

## 2018-09-02 LAB — BASIC METABOLIC PANEL
Anion gap: 8 (ref 5–15)
BUN: 20 mg/dL (ref 8–23)
CO2: 24 mmol/L (ref 22–32)
Calcium: 8.5 mg/dL — ABNORMAL LOW (ref 8.9–10.3)
Chloride: 104 mmol/L (ref 98–111)
Creatinine, Ser: 1.73 mg/dL — ABNORMAL HIGH (ref 0.61–1.24)
GFR calc Af Amer: 45 mL/min — ABNORMAL LOW (ref >60)
GFR calc non Af Amer: 39 mL/min — ABNORMAL LOW (ref >60)
Glucose, Bld: 200 mg/dL — ABNORMAL HIGH (ref 70–99)
Potassium: 4.1 mmol/L (ref 3.5–5.1)
Sodium: 136 mmol/L (ref 135–145)

## 2018-09-02 LAB — GLUCOSE, CAPILLARY
Glucose-Capillary: 186 mg/dL — ABNORMAL HIGH (ref 70–99)
Glucose-Capillary: 260 mg/dL — ABNORMAL HIGH (ref 70–99)

## 2018-09-02 MED ORDER — PYRIDOSTIGMINE BROMIDE 60 MG PO TABS: 30.0000 mg | ORAL_TABLET | Freq: Three times a day (TID) | ORAL | 1 refills | Status: DC

## 2018-09-02 NOTE — TOC Transition Note (Signed)
Transition of Care [facility]) - CM/SW Discharge Note   Patient Details  Name: Harry [name] MRN: [id number] Date of Birth: August 06, 1945  [facility] CM/SW Contact:  Candie Chroman, LCSW Phone Number: 09/02/2018, 12:52 PM   Clinical Narrative: CSW facilitated patient discharge including contacting patient family and facility to confirm patient discharge plans. Clinical information faxed to facility and family agreeable with plan. CSW arranged ambulance transport via PTAR to [facility] at 2:00. RN to call report prior to discharge [phone]).  CSW will sign off for now as social work intervention is no longer needed. Please consult [place] again if new needs arise.  Final next level of care: Skilled Nursing Facility Barriers to Discharge: Barriers Resolved   Patient Goals and CMS Choice Patient states their goals for this hospitalization and ongoing recovery are:: "I'm ready to go home." CMS Medicare.gov Compare Post Acute Care list provided to:: Other (Comment Required)(Patient will return to SNF.) Choice offered to / list presented to : NA  Discharge Placement   Existing PASRR number confirmed : 09/01/18          Patient chooses bed at: [facility] Patient to be transferred to facility by: [place] Name of family member notified: Left voicemail for daughter, [name] to let her know transport was being set up for 2:00. Had already talked to her this morning and let her know he would likely discharge today. Patient and family notified of of transfer: 09/02/18  Discharge Plan and Services     Post Acute Care Choice: [place]                               Social Determinants of Health (SDOH) Interventions     Readmission Risk Interventions Readmission Risk Prevention Plan 08/14/2018  Transportation Screening Complete  PCP or Specialist Appt within 3-5 Days Complete  HRI or [place] Not Complete  HRI or Home Care  Consult comments pt discharging to SNF  Social Work Consult for [place] Planning/Counseling Not Complete  SW consult not completed comments no needs  Palliative Care Screening Not Applicable  Medication Review (RN Care Manager) Referral to Pharmacy  Some recent data might be hidden

## 2018-09-02 NOTE — Progress Notes (Signed)
Pt. D/c to SNF, report given to [facility]. Awaiting PTAR to transport pt. Pt. Alert and oriented. PIV removed nos/sx of swelling or infiltration noted.

## 2018-09-02 NOTE — Consult Note (Signed)
   [facility] CM Inpatient Consult   09/02/2018  [name] April 25, 1945 [id number]    Patient reviewed for potential [facility] Care Management services needed with a 32% extreme high risk score for unplanned readmission under his Medicare/ NextGen plan. Had 3 hospitalizations and 2 ED visits in the past 6 months.   Alerted by [facility] post-acute care RN of this hospitalization, and states will continue to follow patient once he returns to SNF from the hospital. Patient was outreached and engaged by [place] care management coordinators and [facility] social worker in the past. Our community based plan of care has focused on disease management and community resource support.  Per chart review and history & physical dated 08/31/18, reveal as follows: Harry [name] is a 73 y.o. male with medical history significant of mild dementia, type 2 diabetes, chronic kidney disease stage III, recent ischemic stroke, recurrent falls, hypertension, peripheral vascular disease and history of tobacco abuse-   who presented to the ER with sudden altered mental status and confusion.  Patient's blood pressure apparently drops and he was hypotensive after the episode. He was out for unknown period of time.  When he came to his blood sugar was about 68.  Patient received IV fluids.  His work-up in the ER was essentially negative except for evidence of acute on chronic kidney disease and dehydration.  Patient's blood pressure elevated after IV fluids.  He denied any chest pain.  Denied any nausea vomiting or diarrhea. Denied any substance use.  Patient is being admitted therefore for work-up of syncope.  (autonomic neuropathy, hypertensive emergency, syncope and collapse)  His primary care provideris Dr.[name] Fanta.  Current review of dispositionper transition of care SW note showsthat patientwilltransitionback toSNF(skilled nursing facility).Patient is from [facility] SNF receiving rehab therapy and will be  returning back there.  If there are changes in disposition, please place a [place] Management consult for follow-up as appropriate.   [facility] post acute RN coordinator notified of current disposition for follow-up.    For questions and additional information, please call:  Kerney Hopfensperger A. Ralynn San, BSN, RN-BC [facility] Liaison Cell: [phone]

## 2018-09-02 NOTE — TOC Progression Note (Signed)
Transition of Care [facility]) - Progression Note    Patient Details  Name: Harry [name] MRN: [id number] Date of Birth: Feb 24, 1946  Transition of Care [facility]) CM/SW [place], LCSW Phone Number: 09/02/2018, 12:14 PM  Clinical Narrative:  Left message for SNF admissions coordinator to let her know discharge summary is in and see what time PTAR can be set up.   Expected Discharge Plan: [place]    Expected Discharge Plan and Services Expected Discharge Plan: [place] Choice: [place] Living arrangements for the past 2 months: Single Family Home Expected Discharge Date: 09/02/18                                     Social Determinants of Health (SDOH) Interventions    Readmission Risk Interventions Readmission Risk Prevention Plan 08/14/2018  Transportation Screening Complete  PCP or Specialist Appt within 3-5 Days Complete  HRI or [place] Not Complete  HRI or Home Care Consult comments pt discharging to SNF  Social Work Consult for [place] Planning/Counseling Not Complete  SW consult not completed comments no needs  Palliative Care Screening Not Applicable  Medication Review (RN Transport planner) Referral to Pharmacy  Some recent data might be hidden

## 2018-09-02 NOTE — Discharge Summary (Signed)
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 Risk         Hypertension, hyperlipidemia, Diabetes, current smoker, prior Factors:          CVA.  Vascular Interventions: S/P bilateral SFA atherectomy and angioplasty, right was                         performed in 7/16 and left in 6/16. Subsequent R SFA                         distal occlusion. Current ABI:            Today's ABIs are 0.63 on the right and 0.84 on the left. Comparison Study: Previous arterial duplex performed in 8/19 showed patent left  SFA with 30-49% stenosis, PSV of 156 cm/sec. Performing Technologist: Mariane Masters RVT  Examination Guidelines: A complete evaluation includes B-mode imaging, spectral Doppler, color Doppler, and power Doppler as needed of all accessible  portions of each vessel. Bilateral testing is considered an integral part of a complete examination. Limited examinations for reoccurring indications may be performed as noted.  +-----------+--------+-----+---------------+---------+--------+ LEFT       PSV cm/sRatioStenosis       Waveform Comments +-----------+--------+-----+---------------+---------+--------+ CFA Prox   144                         biphasic          +-----------+--------+-----+---------------+---------+--------+ DFA        85                          triphasic         +-----------+--------+-----+---------------+---------+--------+ SFA Prox   107                         biphasic plaque   +-----------+--------+-----+---------------+---------+--------+ SFA Mid    183          30-49% stenosisbiphasic plaque   +-----------+--------+-----+---------------+---------+--------+ SFA Distal 102                         biphasic plaque   +-----------+--------+-----+---------------+---------+--------+ POP Prox   119                         biphasic          +-----------+--------+-----+---------------+---------+--------+ POP Mid    95                          biphasic          +-----------+--------+-----+---------------+---------+--------+ POP Distal 51                          biphasic          +-----------+--------+-----+---------------+---------+--------+ TP Trunk   58                          biphasic          +-----------+--------+-----+---------------+---------+--------+ ATA Prox   24                                            +-----------+--------+-----+---------------+---------+--------+ ATA Mid    0            occluded                         +-----------+--------+-----+---------------+---------+--------+ ATA Distal 0            occluded                         +-----------+--------+-----+---------------+---------+--------+ PTA Distal 52                           biphasic          +-----------+--------+-----+---------------+---------+--------+ PERO Distal58                                            +-----------+--------+-----+---------------+---------+--------+  A focal velocity elevation of 183 cm/s was obtained at mid SFA with post stenotic turbulence with a VR of 1.5. Findings are characteristic of 30-49% stenosis.  Summary: Left: 30-49% stenosis noted in the superficial femoral artery. Occlusion of the anterior tibial artery. No significant change compared to previous study.  See table(s) above for measurements and observations. See ABI report. Patient seeing Dr. Gwenlyn Found tomorrow, 08/08/2018. Suggest follow up study in 12 months. Electronically signed by Quay Burow MD on 08/08/2018 at 10:16:54 AM.    Final     Microbiology: Recent Results (from the past 240 hour(s))  SARS Coronavirus 2 (CEPHEID- Performed in [facility] hospital lab), Hosp Order     Status: None   Collection Time: 08/31/18 10:45 PM   Specimen: Nasopharyngeal Swab  Result Value Ref Range Status   SARS Coronavirus 2 NEGATIVE NEGATIVE Final    Comment: (NOTE) If result is NEGATIVE SARS-CoV-2 target nucleic acids are NOT DETECTED. The SARS-CoV-2 RNA is generally detectable in upper and lower  respiratory specimens during the acute phase of infection. The lowest  concentration of SARS-CoV-2 viral copies this assay can detect is 250  copies / mL. A negative result does not preclude SARS-CoV-2 infection  and should not be used as the sole basis for treatment or other  patient management decisions.  A negative result may occur with  improper specimen collection / handling, submission of specimen other  than nasopharyngeal swab, presence of viral mutation(s) within the  areas targeted by this assay, and inadequate number of viral copies  (<250 copies / mL). A negative result must be combined with clinical  observations, patient history, and epidemiological information. If result is  POSITIVE SARS-CoV-2 target nucleic acids are DETECTED. The SARS-CoV-2 RNA is generally detectable in upper and lower  respiratory specimens dur ing the acute phase of infection.  Positive  results are indicative of active infection with SARS-CoV-2.  Clinical  correlation with patient history and other diagnostic information is  necessary to determine patient infection status.  Positive results do  not rule out bacterial infection or co-infection with other viruses. If result is PRESUMPTIVE POSTIVE SARS-CoV-2 nucleic acids MAY BE PRESENT.   A presumptive positive result was obtained on the submitted specimen  and confirmed on repeat testing.  While 2019 novel coronavirus  (SARS-CoV-2) nucleic acids may be present in the submitted sample  additional confirmatory testing may be necessary for epidemiological  and / or clinical management purposes  to differentiate between  SARS-CoV-2 and other Sarbecovirus currently known to infect humans.  If clinically indicated additional testing with an alternate test  methodology [phone]) is advised. The SARS-CoV-2 RNA is generally  detectable in upper and lower respiratory sp ecimens during the acute  phase of infection. The expected result is Negative. Fact Sheet for Patients:  StrictlyIdeas.no Fact Sheet for Healthcare Providers: BankingDealers.co.za This test is not yet approved or cleared by the [place] FDA and has been authorized for detection and/or diagnosis of SARS-CoV-2 by FDA under an Emergency Use Authorization (EUA).  This EUA will remain in effect (meaning this test can be used) for the duration of the COVID-19 declaration under Section 564(b)(1) of the Act, 21 U.S.C. section 360bbb-3(b)(1), unless the authorization is terminated or revoked sooner. Performed at [facility], [place] [address] [place], [place] [address]   Urine culture     Status: Abnormal   Collection Time:  08/31/18 10:51 PM   Specimen: Urine, Random  Result Value Ref Range Status  Specimen Description URINE, RANDOM  Final   Special Requests   Final    NONE Performed at [facility], [place] [address], [place], [place] [address]    Culture MULTIPLE SPECIES PRESENT, SUGGEST RECOLLECTION (A)  Final   Report Status 09/01/2018 FINAL  Final     Labs: Basic Metabolic Panel: Recent Labs  Lab 08/31/18 1847 09/01/18 0331 09/02/18 0722  NA 141 138 136  K 3.7 4.2 4.1  CL 107 105 104  CO2 24 21* 24  GLUCOSE 65* 215* 200*  BUN 27* 23 20  CREATININE 2.61* 1.84* 1.73*  CALCIUM 9.4 8.8* 8.5*   Liver Function Tests: Recent Labs  Lab 08/31/18 1847 09/01/18 0331  AST 17 23  ALT 15 16  ALKPHOS 63 64  BILITOT 0.5 0.6  PROT 7.1 7.1  ALBUMIN 3.6 3.5   No results for input(s): LIPASE, AMYLASE in the last 168 hours. No results for input(s): AMMONIA in the last 168 hours. CBC: Recent Labs  Lab 08/31/18 1847 09/01/18 0331  WBC 5.5 6.3  NEUTROABS 2.5 3.5  HGB 8.7* 9.2*  HCT 29.2* 30.1*  MCV 87.2 86.0  PLT 279 283   Cardiac Enzymes: No results for input(s): CKTOTAL, CKMB, CKMBINDEX, TROPONINI in the last 168 hours. BNP: BNP (last 3 results) No results for input(s): BNP in the last 8760 hours.  ProBNP (last 3 results) No results for input(s): PROBNP in the last 8760 hours.  CBG: Recent Labs  Lab 09/01/18 0540 09/01/18 1624 09/01/18 2128 09/02/18 0549 09/02/18 1122  GLUCAP 191* 257* 287* 186* 260*       Signed:  Radene Gunning NP.  [facility] 09/02/2018, 11:30 AM

## 2018-09-03 DIAGNOSIS — H269 Unspecified cataract: Secondary | ICD-10-CM | POA: Diagnosis not present

## 2018-09-03 DIAGNOSIS — H04123 Dry eye syndrome of bilateral lacrimal glands: Secondary | ICD-10-CM | POA: Diagnosis not present

## 2018-09-03 DIAGNOSIS — G3183 Dementia with Lewy bodies: Secondary | ICD-10-CM | POA: Diagnosis not present

## 2018-09-04 ENCOUNTER — Other Ambulatory Visit: Payer: Self-pay | Admitting: *Deleted

## 2018-09-04 NOTE — Patient Outreach (Signed)
Member assessed for potential [place] [name] Care Management needs as a benefit of  [place] Medicare.  Harry [name] has returned to [name] after a brief [name] readmission. He is receiving rehab therapy.  Member discussed in weekly telephonic IDT meeting with facility staff, [name] UM team, and writer.  Discussed that member's daughter is the primary contact. Discussed that Probation officer will outreach to Harry [name] (daughter) to discuss [place] Management services for Harry [name] post SNF discharge.  Telephone call made to Harry [name] (daughter) at [phone]. Patient identifiers confirmed.   Harry [name] reports the disposition plan is for Harry [name] to return home upon SNF discharge. Harry [name] lives with Harry [name] and her husband and 38 yr old son. Harry [name] states Harry [name] "does what he wants to do. He still smokes cigarettes and will not eat and often get dehydrated." Harry [name] states Harry [name] has been told that he has an autonomic dysfunction condition which causes him to have labile blood pressures that lead to him passing out per [employer].   Discussed long term planning regarding maybe ALF after discharge. Harry [name] states they cannot afford ALF and she would prefer he return home with additional assistance in the home. Harry [name] states Harry [name] does not have Medicaid. States she wants to apply for Medicaid on his behalf.   Writer was able to confirm that Harry [name] can call [place] to have a case worker call her back to apply for Medicaid over the phone. Provided the list of required documents and the like for the Medicaid process.   Harry [name] is agreeable to [place] Management follow up post SNF discharge for her father. Agreeable to [facility] [facility] Social Worker. Provided Harry [name] with writer's contact information as well.  [facility] Care Management is well known to Harry [name] as her mother was once followed by [place] Management program. Her mother is now deceased and Harry [name] has been the primary  caregiver for Harry [name] every since.   Will continue to follow Harry [name] progression while at [facility] and will collaborate with member's daughter, Harry [name] UM team, and [facility] staff.  Will plan to make [place] team referral upon SNF discharge.   Marthenia Rolling, MSN-Ed, RN,BSN [place] Acute Care Coordinator [phone] [place] [name]) [phone]  (Toll free office)

## 2018-09-05 DIAGNOSIS — E1143 Type 2 diabetes mellitus with diabetic autonomic (poly)neuropathy: Secondary | ICD-10-CM | POA: Diagnosis not present

## 2018-09-09 DIAGNOSIS — N183 Chronic kidney disease, stage 3 (moderate): Secondary | ICD-10-CM | POA: Diagnosis not present

## 2018-09-09 DIAGNOSIS — I739 Peripheral vascular disease, unspecified: Secondary | ICD-10-CM | POA: Diagnosis not present

## 2018-09-09 DIAGNOSIS — I10 Essential (primary) hypertension: Secondary | ICD-10-CM | POA: Diagnosis not present

## 2018-09-09 DIAGNOSIS — E1143 Type 2 diabetes mellitus with diabetic autonomic (poly)neuropathy: Secondary | ICD-10-CM | POA: Diagnosis not present

## 2018-09-11 ENCOUNTER — Other Ambulatory Visit: Payer: Self-pay | Admitting: *Deleted

## 2018-09-11 NOTE — Patient Outreach (Signed)
Telephone call received from [name], Mr. Boehle daughter.  Mr. Santibanez remains at [facility] SNF receiving rehab therapy. Member assessed for potential [facility] Care Management needs as a benefit of [place] Medicare.  [name] (daughter) states that she spoke with DSS case worker who confirmed that Mr. Matton does not qualify for supplemental Medicaid or full Medicaid.   Several options were discussed for when Mr. Ogborn discharges from SNF. 1. PACE-  [name] states he was extremely resistant to PACE in the recent past and does not think he will even re-consider. 2. ALF- [name] states the cost is going to be an issue that she does not think they can afford. Discussed exploring the cost at some of the ALFs just to know for sure. She herself is furloughed from work. So money is an issue. 3. Private duty sitters or family/friend to stay with him- [name] states she has tried that and Mr. Osment ran them off and plus she says he passes out on them.  Lastly, hospice and palliative services were discussed-[name] is agreeable to an Hospice consult. However, [name] states member declined hospice in the past and that he wanted "everything" done. However, [name] thinks hospice consult could be beneficial and would like a hospice/palliative consult if one can be done.  Discussed that Probation officer will collaborate with SNF staff, [facility] CM and UM team about other potential options and suggestions. [name] expressed appreciation.  Writer sent update to [facility] team and [facility] facility staff.   Will continue to follow for disposition plans and progression.   Marthenia Rolling, MSN-Ed, RN,BSN [place] Acute Care Coordinator [phone] [facility]) [phone]  (Toll free office)

## 2018-09-12 ENCOUNTER — Other Ambulatory Visit: Payer: Self-pay | Admitting: *Deleted

## 2018-09-12 NOTE — Patient Outreach (Signed)
Spoke with Harry [name], Harry [name] daughter. Made her aware Probation officer collaborated with other team members including [place] discharge planner.   Provided Harry [name] with  [employer] information as well as contact information to call to find out if there are any other resources that have not already been explored. It sounds like ALF would be the best option for Harry [name], however, discussed that there will be an out of pocket expense. Encouraged Harry [name] to call and explore ALF and costs of facilities that were provided by SNF discharge planner.   Will continue to follow for disposition plans, progression. Will make referral to [facility] team upon SNF discharge.   Harry Rolling, MSN-Ed, RN,BSN [place] Acute Care Coordinator [phone] [facility]) [phone]  (Toll free office)

## 2018-09-16 ENCOUNTER — Other Ambulatory Visit: Payer: Self-pay | Admitting: *Deleted

## 2018-09-16 NOTE — Patient Outreach (Signed)
Member assessed for potential [facility] Care Management needs as a benefit of [place] Medicare.  Mr. Outten is currently at [facility] SNF receiving rehab therapy.  Telephone call made to [place] at [phone] to follow up on disposition plans. [name] reports the plan remains for member to return home at discharge. [name] states she is looking into having someone stay with member for 5 hours a day. [name] states she still has to call [employer] to see if there are any other resources she can explore.  [name] expresses understanding that Probation officer will make [place] referral and [facility] LCSW.referral upon SNF discharge.  Will continue to collaborate with facility, [facility] UM team, and member's daughter.    Marthenia Rolling, MSN-Ed, RN,BSN [place] Acute Care Coordinator [phone] [facility]) [phone]  (Toll free office)

## 2018-09-17 ENCOUNTER — Telehealth: Payer: Self-pay | Admitting: *Deleted

## 2018-09-17 NOTE — Telephone Encounter (Signed)
Per [name], Mr.Ricco is in rehab.

## 2018-09-24 DIAGNOSIS — M6281 Muscle weakness (generalized): Secondary | ICD-10-CM | POA: Diagnosis not present

## 2018-09-24 DIAGNOSIS — I951 Orthostatic hypotension: Secondary | ICD-10-CM | POA: Diagnosis not present

## 2018-09-24 DIAGNOSIS — I69959 Hemiplegia and hemiparesis following unspecified cerebrovascular disease affecting unspecified side: Secondary | ICD-10-CM | POA: Diagnosis not present

## 2018-09-24 DIAGNOSIS — E1122 Type 2 diabetes mellitus with diabetic chronic kidney disease: Secondary | ICD-10-CM | POA: Diagnosis not present

## 2018-09-24 DIAGNOSIS — I739 Peripheral vascular disease, unspecified: Secondary | ICD-10-CM | POA: Diagnosis not present

## 2018-09-24 DIAGNOSIS — I10 Essential (primary) hypertension: Secondary | ICD-10-CM | POA: Diagnosis not present

## 2018-09-24 DIAGNOSIS — F0390 Unspecified dementia without behavioral disturbance: Secondary | ICD-10-CM | POA: Diagnosis not present

## 2018-09-25 ENCOUNTER — Other Ambulatory Visit: Payer: Self-pay | Admitting: *Deleted

## 2018-09-25 DIAGNOSIS — I10 Essential (primary) hypertension: Secondary | ICD-10-CM

## 2018-09-25 NOTE — Patient Outreach (Signed)
Member assessed for potential [facility] Care Management needs as a benefit of  [place] Medicare.  Member discussed in weekly telephonic IDT meeting with [facility]  facility staff, [facility] UM RN, and Probation officer.  Facility discharge planner reports that Mr. Studley will discharge home today with [facility]. He will return home with his daughter. Facility reports that his daughter [name] is arranging for additional assist for Mr. Bornemann at home.  Made facility staff and [facility] UM RN aware referral   Left HIPPA compliant voicemail message left for [place] as well.  [name] (daughter) at [phone] is primary contact.   Made facility aware [place] Management will follow post discharge.  Will make referral to [place] and [place] for follow up.   Marthenia Rolling, MSN-Ed, RN,BSN [place] Acute Care Coordinator [phone] [facility]) [phone]  (Toll free office)

## 2018-09-26 ENCOUNTER — Other Ambulatory Visit: Payer: Self-pay | Admitting: *Deleted

## 2018-09-26 NOTE — Patient Outreach (Signed)
[facility]) Care Management  09/26/2018  JEX STRAUSBAUGH 01/08/1946 [id number]   Referral received from post acute care coordinator as member was discharged yesterday from SNF for rehab.  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  Per chart, he also has history of hypertension, PAD, stroke, diabetes, dementia, CKD, and hyperlipidemia.  Per notes, daughter [name] is contact person.  Call placed to daughter, member's identity verified.  This care manager introduced self and stated purpose of call.  [facility] care management services explained.  She report it has been recommended that member has 24 hour care.  He lives with her and her husband.  They are both working outside of the home currently, but taking turns being home with the member at during his transition home.  State she wanted member to stay at facility for long term care but he was ready to be discharged.  Expresses concern regarding the cost of increased support either in the home or through assisted living facility.  State member was denied Medicaid but is looking to see if he will be eligible if they apply again.  She is open to receiving information/resources from Education officer, museum.  Member is active with [facility].  State she did receive a call from them but will not have a visit until around Tuesday of next week.  Hoping to have nursing, aide, PT, & OT to increase strength.  She has been checking member's blood sugar and blood pressure, report today's reading was 515 and 137/80.  State his blood sugar was high because the were just able to get his insulin filled today.  He was previously on long acting insulin 1-2 times a day, now on sliding scale.  She is concerned how member will give his own insulin once she and her husband are back at work daily.  State member has poor vision (she will schedule eye exam) and is not able to read medication labels/syringes.  Also  report insulin was greater then $400.    Daughter expresses concern regarding member's decline in health condition.  State he has been living with her since 2018 and has seen a steady decline in his health.  She verbalizes understanding that there is no cure for his condition and report they have had palliative care discussed with them previously but member stated he wasn't "ready to die."  Difference between palliative care and hospice discussed, state she is aware of the difference but wish member would also understand.  Discussed involvement with [facility] for home based palliative care program, she is open to having them present benefits and speak with member.    Denies any urgent concerns at this time.  Advised to contact with questions.  Will place referrals to social worker, pharmacy, and [facility].  Will follow up with member within the next week.  Fall Risk  09/26/2018 02/18/2017 12/28/2016 10/02/2016 07/13/2016  Falls in the past year? 1 Yes No Yes Yes  Comment - - - - -  Number falls in past yr: 1 2 or more - 2 or more 1  Comment - - - - -  Injury with Fall? 0 No - Yes No  Risk Factor Category  - - - High Fall Risk -  Risk for fall due to : Other (Comment) Impaired balance/gait - History of fall(s);Impaired balance/gait;Impaired mobility;Impaired vision;Medication side effect History of fall(s);Impaired balance/gait;Impaired mobility;Impaired vision  Risk for fall due to: Comment orthostatic hypotension confusion - - -  Follow up Falls prevention discussed - - Falls evaluation completed;Education provided;Falls prevention discussed Falls prevention discussed;Education provided   Depression screen [facility] 2/9 12/28/2016 10/02/2016 07/13/2016  Decreased Interest 0 0 0  Down, Depressed, Hopeless 0 0 0  PHQ - 2 Score 0 0 0   THN CM Care Plan Problem One     Most Recent Value  Care Plan Problem One  Risk for readmission related to hypotension/fall as evidenced by recent  hospitalization requiring SNF stay for rehab  Role Documenting the Problem One  Care Management [place] for Problem One  Active  [facility] Long Term Goal   Member will not be readmitted to hospital within the next 31 days  THN Long Term Goal Start Date  09/26/18  Interventions for Problem One Long Term Goal  Discharge plan reviewed with daughter.  Medications reviewed, referral placed to pharmacy.  Referral placed to social worker for Medicaid assistnace and long term plan for increased support.  THN CM Short Term Goal #1   Daughter will report contact with [facility] within the next 2 weeks  THN CM Short Term Goal #1 Start Date  09/26/18  Interventions for Short Term Goal #1  Educated on benefits of palliative care, referral placed to [facility]  THN CM Short Term Goal #2   Daughter will report follow up appointment with primary MD within the next 2 weeks  THN CM Short Term Goal #2 Start Date  09/26/18  Interventions for Short Term Goal #2  Educated on importance of close follow up with primary MD, advised to call office for appointment     Valente David, RN, MSN [place] Manager [phone]

## 2018-09-29 ENCOUNTER — Other Ambulatory Visit: Payer: Self-pay | Admitting: Pharmacy Technician

## 2018-09-29 ENCOUNTER — Encounter: Payer: Self-pay | Admitting: *Deleted

## 2018-09-29 ENCOUNTER — Other Ambulatory Visit: Payer: Self-pay | Admitting: Pharmacist

## 2018-09-29 ENCOUNTER — Other Ambulatory Visit: Payer: Self-pay

## 2018-09-29 NOTE — Patient Outreach (Signed)
[facility]) Care Management  09/29/2018  Harry [name] February 24, 1946 [id number]                          Medication Assistance Referral  Referral From: [place] / [place] [place] Patient application portion: Emailed to [email or web address] Provider application portion: Faxed  to Dr. Legrand Rams   Follow up:  Will follow up with patient in 3-5 business days to confirm application(s) have been received.  [name] Chana Bode [place] Certified Pharmacy Technician [place] Management Direct Dial:[phone]

## 2018-09-29 NOTE — Patient Outreach (Signed)
[facility] [facility]) Care Management  09/29/2018  Harry [name] [birth year] [id number]   Social work referral received from [facility], [employer].  "Daughter interested in developing long term plan for patient support. Patient lives with her and her husband, but in need of supervision several hours a day. Both she and husband work outside of the home, taking turns being home with patient until he's more stable. Interested in options for ALF if feasible financially. Also interested in resources to provide in home support if ALF not an option." Unsuccessful outreach to daughter today.  Unable to leave voicemail message due to mailbox being full.  Mailed unsuccessful outreach letter.  Will attempt to reach again within four business days.  Ronn Melena, BSW Social Worker [phone]

## 2018-09-29 NOTE — Patient Outreach (Signed)
[facility]) Care Management  [place]   09/29/2018  Harry [name] 04/30/1945 [id number]  Reason for referral: Medication Assistance  Referral source: [facility] RN Current insurance: [employer]  PMHx includes but not limited to:  Mild dementia, severe orthostatic hypotension with syncope, long standing autonomic neuropathy, T2DM, recurrent falls, HTN, HLD, tobacco abuse, prior ETOH abuse, CKD-III, PVD, recent hospitalization 6/8-6/18 for stroke (residual hemiplegia) and 6/28-6/30 for syncope / orthostatic hypotension likely 2/2 severe autonomic neuropathy.  Patient now home from SNF, living with daughter, [name].    Outreach:  Successful telephone call with daughter, [name].  HIPAA identifiers verified.   Subjective:  Daughter states she helps manage patient's medications and gives them to patient each day.  She reports prior to hospitalizations, patient was on long-acting insulin, Basaglar 20 units BID.  She states he was switched to SSI when admitted to hospital and orders have not changed since he was discharged.  She states he is not doing well with SSI due to erratic sleeping and eating schedule as well as uncontrolled CBGs.  She reports she has slowly started giving him back his long-acting insulin.  She states last night she gave patient 15 units Basaglar and fasting CBG today is 373.  She reports his CBGs range from 250-515.    Objective: The ASCVD Risk score Mikey Bussing DC Jr., et al., 2013) failed to calculate for the following reasons:   The patient has a prior MI or stroke diagnosis  Lab Results  Component Value Date   CREATININE 1.73 (H) 09/02/2018   CREATININE 1.84 (H) 09/01/2018   CREATININE 2.61 (H) 08/31/2018    Lab Results  Component Value Date   HGBA1C 8.2 (H) 08/12/2018    Lipid Panel     Component Value Date/Time   CHOL 99 08/12/2018 0322   TRIG 81 08/12/2018 0322   TRIG 144 12/14/2005 1558   HDL 31 (L) 08/12/2018 0322   CHOLHDL 3.2  08/12/2018 0322   VLDL 16 08/12/2018 0322   LDLCALC 52 08/12/2018 0322    BP Readings from Last 3 Encounters:  09/02/18 (!) 159/79  08/19/18 (!) 166/91  08/08/18 132/74    Allergies  Allergen Reactions  . Lipitor [Atorvastatin] Other (See Comments)    Myalgia   . Statins Other (See Comments)    Myalgia (CAN tolerate Crestor, however)  . Pravachol [Pravastatin] Rash    Medications Reviewed Today    Reviewed by Valente David, RN (Registered Nurse) on 09/26/18 at 1546  Med List Status: <None>  Medication Order Taking? Sig Documenting Provider Last Dose Status Informant  acetaminophen (TYLENOL) 500 MG tablet [id number] Yes Take 1,000 mg by mouth every 6 (six) hours as needed (fever, headaches, or pain).  [provider] Taking Active Nursing Home Medication Administration Guide (MAG)  aspirin 81 MG EC tablet [id number] Yes Take 1 tablet (81 mg total) by mouth daily. Charlie Pitter, PA-C Taking Active Nursing Home Medication Administration Guide (MAG)  clopidogrel (PLAVIX) 75 MG tablet [id number] Yes TAKE ONE TABLET BY MOUTH DAILY  Patient taking differently: Take 75 mg by mouth daily.    Lorretta Harp, MD Taking Active Nursing Home Medication Administration Guide (MAG)  insulin aspart (NOVOLOG) 100 UNIT/ML injection [id number] Yes Inject 0-9 Units into the skin 3 (three) times daily with meals. CBG 121 - 150: 1 unit CBG 151 - 200: 2 units CBG 201 - 250: 3 units CBG 251 - 300: 5 units CBG 301 - 350: 7 units CBG 351 -  400 9 units CBG > 400 call MD and obtain STAT lab verification Jonnie Finner, DO Taking Active Nursing Home Medication Administration Guide (MAG)           Med Note Orvan Seen, HEATHER L   Sun Aug 31, 2018 10:04 PM) Today: 7 units breakfast, 3 units lunch, 9 units supper  polyvinyl alcohol (ARTIFICIAL TEARS) 1.4 % ophthalmic solution [id number] Yes Place 1 drop into both eyes every 12 (twelve) hours as needed for dry eyes.  [provider] Taking Active  Nursing Home Medication Administration Guide (MAG)  pyridostigmine (MESTINON) 60 MG tablet [id number] Yes Take 0.5 tablets (30 mg total) by mouth 3 (three) times daily. Radene Gunning, NP Taking Active   rosuvastatin (CRESTOR) 5 MG tablet [id number] Yes Take 1 tablet (5 mg total) by mouth daily.  Patient taking differently: Take 5 mg by mouth at bedtime.    Lorretta Harp, MD Taking Active Nursing Home Medication Administration Guide (MAG)  sertraline (ZOLOFT) 25 MG tablet [id number] Yes TAKE ONE TABLET BY MOUTH DAILY  Patient taking differently: Take 25 mg by mouth daily.    Penni Bombard, MD Taking Active Nursing Home Medication Administration Guide (MAG)  vitamin B-12 1000 MCG tablet [id number] Yes Take 1 tablet (1,000 mcg total) by mouth daily. Cristal Ford, DO Taking Active Nursing Home Medication Administration Guide ([place])  Med List Note Gwynne Edinger 08/31/18 2105): Resident of [facility] [id number]          Assessment: Drugs sorted by system:  Neurologic/Psychologic: pyridostigmine, sertraline  Hematologic: aspirin '81mg'$ , clopidogrel  Cardiovascular: rosuvastatin  Endocrine: insulin aspart, Basaglar  Topical: artifical tears  Pain: acetaminophen  Vitamins/Minerals/Supplements:vitamin B12  Medication Review Findings:  . CBGs uncontrolled with SSI.  Patient has not been resumed on previous long-acting insulin.  Daughter has started giving patient small dose of long-acting insulin on her own.    Medication Assistance Findings:  Medication assistance needs identified: Engineer, [employer]  Extra Help:  Not eligible for Extra Help Low Income Subsidy based on reported income and assets  Patient Assistance Programs: Engineer, agricultural made by [place] requirement met: Yes o Out-of-pocket prescription expenditure met:   Unknown - Patient has met application requirements to apply for this program.  - Reviewed program requirements with patient.    - If MD wants patient to continue on SSI, can also include Humalog with application   Plan: . Message left with PCP office with update re: diabetes / insulin . I will route patient assistance letter to [place] technician who will coordinate patient assistance program application process for medications listed above.  [facility] pharmacy technician will assist with obtaining all required documents from both patient and provider(s) and submit application(s) once completed.  Daughter requests application be emailed to her at [email or web address]  Ralene Bathe, PharmD, [place] [phone]

## 2018-09-30 ENCOUNTER — Ambulatory Visit: Payer: Self-pay

## 2018-09-30 ENCOUNTER — Other Ambulatory Visit: Payer: Self-pay

## 2018-09-30 NOTE — Patient Outreach (Signed)
[place] [facility]) Care Management  09/30/2018  SHELLIE GOETTL [birth year] [id number]   Social work referral received from [facility], [employer].  "Daughter interested in developing long term plan for patient support. Patient lives with her and her husband, but in need of supervision several hours a day. Both she and husband work outside of the home, taking turns being home with patient until he's more stable. Interested in options for ALF if feasible financially. Also interested in resources to provide in home support if ALF not an option." Successful outreach to daughter today.  BSW and daughter discussed levels of care.  Daughter expressed concern for patient's safety if he remains in the home but said that he is resistant to the idea of ALF.  Daughter would be open to in-home aide services, however, patient/family cannot afford to privately pay for these services. Daughter reports that she spoke with a caseworker at [facility] and was told that patient does not qualify for Adult Medicaid based on income.  They have not applied for Medicaid for Long-Term Care.   BSW informed daughter that most long-term care facilities are not taking patient's directly from home due to concerns of [place], however, she would still like to pursue options.  BSW is passing referral back to CSW, [employer]  Informed daughter that Di Kindle will contact her within the next ten days.  Ronn Melena, BSW Social Worker [phone]

## 2018-10-01 ENCOUNTER — Other Ambulatory Visit: Payer: Self-pay | Admitting: Pharmacist

## 2018-10-01 ENCOUNTER — Ambulatory Visit: Payer: Self-pay | Admitting: Pharmacist

## 2018-10-01 DIAGNOSIS — Z87440 Personal history of urinary (tract) infections: Secondary | ICD-10-CM | POA: Diagnosis not present

## 2018-10-01 DIAGNOSIS — I10 Essential (primary) hypertension: Secondary | ICD-10-CM | POA: Diagnosis not present

## 2018-10-01 DIAGNOSIS — I69351 Hemiplegia and hemiparesis following cerebral infarction affecting right dominant side: Secondary | ICD-10-CM | POA: Diagnosis not present

## 2018-10-01 DIAGNOSIS — F0280 Dementia in other diseases classified elsewhere without behavioral disturbance: Secondary | ICD-10-CM | POA: Diagnosis not present

## 2018-10-01 DIAGNOSIS — Z794 Long term (current) use of insulin: Secondary | ICD-10-CM | POA: Diagnosis not present

## 2018-10-01 DIAGNOSIS — E785 Hyperlipidemia, unspecified: Secondary | ICD-10-CM | POA: Diagnosis not present

## 2018-10-01 DIAGNOSIS — E1151 Type 2 diabetes mellitus with diabetic peripheral angiopathy without gangrene: Secondary | ICD-10-CM | POA: Diagnosis not present

## 2018-10-01 DIAGNOSIS — G3183 Dementia with Lewy bodies: Secondary | ICD-10-CM | POA: Diagnosis not present

## 2018-10-01 DIAGNOSIS — Z7982 Long term (current) use of aspirin: Secondary | ICD-10-CM | POA: Diagnosis not present

## 2018-10-01 DIAGNOSIS — I9589 Other hypotension: Secondary | ICD-10-CM | POA: Diagnosis not present

## 2018-10-01 DIAGNOSIS — Z9181 History of falling: Secondary | ICD-10-CM | POA: Diagnosis not present

## 2018-10-01 DIAGNOSIS — F17210 Nicotine dependence, cigarettes, uncomplicated: Secondary | ICD-10-CM | POA: Diagnosis not present

## 2018-10-01 DIAGNOSIS — I7090 Unspecified atherosclerosis: Secondary | ICD-10-CM | POA: Diagnosis not present

## 2018-10-01 DIAGNOSIS — M4800 Spinal stenosis, site unspecified: Secondary | ICD-10-CM | POA: Diagnosis not present

## 2018-10-01 DIAGNOSIS — E1143 Type 2 diabetes mellitus with diabetic autonomic (poly)neuropathy: Secondary | ICD-10-CM | POA: Diagnosis not present

## 2018-10-01 NOTE — Patient Outreach (Signed)
[facility]) Care Management  [place] 10/01/2018  Harry [name] Dec 23, 1945 [id number]  Incoming call and voicemail received from [facility] at Dr. Josephine Cables office.  Return call placed to office and message left for [name].   Will await call back.   Ralene Bathe, PharmD, [place] [phone]

## 2018-10-02 ENCOUNTER — Other Ambulatory Visit: Payer: Self-pay | Admitting: *Deleted

## 2018-10-02 ENCOUNTER — Other Ambulatory Visit: Payer: Self-pay | Admitting: Pharmacist

## 2018-10-02 DIAGNOSIS — I69351 Hemiplegia and hemiparesis following cerebral infarction affecting right dominant side: Secondary | ICD-10-CM | POA: Diagnosis not present

## 2018-10-02 DIAGNOSIS — I10 Essential (primary) hypertension: Secondary | ICD-10-CM | POA: Diagnosis not present

## 2018-10-02 DIAGNOSIS — E1151 Type 2 diabetes mellitus with diabetic peripheral angiopathy without gangrene: Secondary | ICD-10-CM | POA: Diagnosis not present

## 2018-10-02 DIAGNOSIS — I7090 Unspecified atherosclerosis: Secondary | ICD-10-CM | POA: Diagnosis not present

## 2018-10-02 DIAGNOSIS — I9589 Other hypotension: Secondary | ICD-10-CM | POA: Diagnosis not present

## 2018-10-02 DIAGNOSIS — E1143 Type 2 diabetes mellitus with diabetic autonomic (poly)neuropathy: Secondary | ICD-10-CM | POA: Diagnosis not present

## 2018-10-02 NOTE — Patient Outreach (Signed)
[facility]) Care Management  [place]  10/02/2018  KASHAUN BEBO 09/17/1945 [id number]   Reason for call: f/u with daughter re: insulin  Outreach:  Unsuccessful telephone call attempt #1 to patient's daughter.   Unable to leave message  Plan:  -I will make another outreach attempt to patient within 3-4 business days.   -Still awaiting call back from Dr. Josephine Cables office  Ralene Bathe, PharmD, [place] [phone]

## 2018-10-02 NOTE — Patient Outreach (Signed)
[place] [facility]) Care Management  10/02/2018  LORRIN NAWROT 1945/06/19 [id number]   Weekly transition of care call placed to [facility] caregiver/daughter, no answer.  Unable to leave voice message as mailbox is full.  Will follow up within the next 3-4 business days.  Valente David, [place], MSN [place] [phone]

## 2018-10-03 ENCOUNTER — Ambulatory Visit: Payer: Self-pay

## 2018-10-06 ENCOUNTER — Other Ambulatory Visit: Payer: Self-pay

## 2018-10-06 DIAGNOSIS — I9589 Other hypotension: Secondary | ICD-10-CM | POA: Diagnosis not present

## 2018-10-06 DIAGNOSIS — E1143 Type 2 diabetes mellitus with diabetic autonomic (poly)neuropathy: Secondary | ICD-10-CM | POA: Diagnosis not present

## 2018-10-06 DIAGNOSIS — I10 Essential (primary) hypertension: Secondary | ICD-10-CM | POA: Diagnosis not present

## 2018-10-06 DIAGNOSIS — E1151 Type 2 diabetes mellitus with diabetic peripheral angiopathy without gangrene: Secondary | ICD-10-CM | POA: Diagnosis not present

## 2018-10-06 DIAGNOSIS — I69351 Hemiplegia and hemiparesis following cerebral infarction affecting right dominant side: Secondary | ICD-10-CM | POA: Diagnosis not present

## 2018-10-06 DIAGNOSIS — I7090 Unspecified atherosclerosis: Secondary | ICD-10-CM | POA: Diagnosis not present

## 2018-10-07 ENCOUNTER — Encounter: Payer: Self-pay | Admitting: *Deleted

## 2018-10-07 ENCOUNTER — Other Ambulatory Visit: Payer: Self-pay | Admitting: *Deleted

## 2018-10-07 NOTE — Patient Outreach (Signed)
[facility]) Care Management  10/07/2018  Harry [name] 06-23-1945 [id number]   CSW made an initial attempt to try and contact patient's daughter, [name] today to perform the initial phone assessment on patient, as well as assess and assist with social work needs and services, without success.  CSW was unable to leave a HIPAA compliant message for [name], receiving an automated recording indicating that her mailbox is full and unable to receive messages at this time.  CSW will make a second outreach attempt within the next 3-4 business days, if a return call is not received from [name] in the meantime.  CSW will also mail an Outreach Letter to patient's home requesting that patient or [name] contact CSW if they are interested in receiving social work services through [place] with [facility].  Nat Christen, BSW, MSW, LCSW  Licensed Education officer, [facility]  [address] [place], [place] [address] Physical Address-[address], [place], [place] [address] Toll Free Main # [phone] Fax # [phone] Cell # [phone]  Office # [phone] [email or web address]

## 2018-10-08 ENCOUNTER — Other Ambulatory Visit: Payer: Self-pay | Admitting: *Deleted

## 2018-10-08 DIAGNOSIS — E1143 Type 2 diabetes mellitus with diabetic autonomic (poly)neuropathy: Secondary | ICD-10-CM | POA: Diagnosis not present

## 2018-10-08 DIAGNOSIS — I10 Essential (primary) hypertension: Secondary | ICD-10-CM | POA: Diagnosis not present

## 2018-10-08 DIAGNOSIS — E1151 Type 2 diabetes mellitus with diabetic peripheral angiopathy without gangrene: Secondary | ICD-10-CM | POA: Diagnosis not present

## 2018-10-08 DIAGNOSIS — I9589 Other hypotension: Secondary | ICD-10-CM | POA: Diagnosis not present

## 2018-10-08 DIAGNOSIS — I7090 Unspecified atherosclerosis: Secondary | ICD-10-CM | POA: Diagnosis not present

## 2018-10-08 DIAGNOSIS — I69351 Hemiplegia and hemiparesis following cerebral infarction affecting right dominant side: Secondary | ICD-10-CM | POA: Diagnosis not present

## 2018-10-08 NOTE — Patient Outreach (Signed)
[facility]) Care Management  10/08/2018  THIEN BERKA [birth year] [id number]   Weekly transition of care call placed to [facility] caregiver/daughter Gordon.  She state "He's no better, no worse.  Just back to his habits."  Expresses frustration of being able to care for him in the home.  State member is better managed in a "controlled environment."  He is not following his recommended care plan, not following diet, and not allowing assistance with proper hygiene.  Report she and her husband have discussed that if member is to return back to the hospital, they will have him placed upon discharge, not returning to their home.  Inquired about interest in seeking placement prior to potential hospitalization, declines stating they will "just wait it out."  Inquired about contact with [facility], denies stating she has been busy over the last week that she has not had time to answer/return any calls.  Advised to call resources back ([facility], [facility] pharmacist, and [facility] social worker).    Report follow up tele-visit with primary MD, no face to face scheduled yet.  State member's blood pressure remain up & down despite taking medications.  Ranging 80's/40's when up and about, systolic greater than 169 when lying.  Also report blood sugars have been consistently greater then 200 due to member not following diabetic diet.  Following MD orders to increase Lantus up to 30 units daily to maintain blood sugar less than 200, remain on sliding scale as well.    Denies any urgent concerns at this time, advised daughter to consider placement from home in effort to decrease risk of readmission, again state she will wait to see how he does.  Advised to contact this care manager with questions.  Will follow up within the next week.  THN CM Care Plan Problem One     Most Recent Value  Care Plan Problem One  Risk for readmission related to hypotension/fall as evidenced by recent hospitalization  requiring SNF stay for rehab  Role Documenting the Problem One  Care Management [place] for Problem One  Active  [facility] Long Term Goal   Member will not be readmitted to hospital within the next 31 days  THN Long Term Goal Start Date  09/26/18  Interventions for Problem One Long Term Goal  Encouraged daughter to contact social worker to develop plan to increase support in the home and/or provide member with best placement according to level of care needed  [facility] CM Short Term Goal #1   Daughter will report contact with [facility] within the next 2 weeks  [facility] CM Short Term Goal #1 Start Date  09/26/18  Interventions for Short Term Goal #1  Advised to return calls to [facility], will call [facility] to follow up on outreach  [facility] CM Short Term Goal #2   Daughter will report follow up appointment with primary MD within the next 2 weeks  [facility] CM Short Term Goal #2 Start Date  09/26/18  [facility] CM Short Term Goal #2 Met Date  10/08/18     Valente David, RN, MSN [place] [phone]

## 2018-10-09 ENCOUNTER — Ambulatory Visit: Payer: Self-pay | Admitting: Pharmacist

## 2018-10-09 ENCOUNTER — Other Ambulatory Visit: Payer: Self-pay | Admitting: Pharmacist

## 2018-10-09 DIAGNOSIS — I10 Essential (primary) hypertension: Secondary | ICD-10-CM | POA: Diagnosis not present

## 2018-10-09 DIAGNOSIS — E1143 Type 2 diabetes mellitus with diabetic autonomic (poly)neuropathy: Secondary | ICD-10-CM | POA: Diagnosis not present

## 2018-10-09 DIAGNOSIS — I69351 Hemiplegia and hemiparesis following cerebral infarction affecting right dominant side: Secondary | ICD-10-CM | POA: Diagnosis not present

## 2018-10-09 DIAGNOSIS — E1151 Type 2 diabetes mellitus with diabetic peripheral angiopathy without gangrene: Secondary | ICD-10-CM | POA: Diagnosis not present

## 2018-10-09 DIAGNOSIS — I7090 Unspecified atherosclerosis: Secondary | ICD-10-CM | POA: Diagnosis not present

## 2018-10-09 DIAGNOSIS — I9589 Other hypotension: Secondary | ICD-10-CM | POA: Diagnosis not present

## 2018-10-09 NOTE — Patient Outreach (Signed)
[facility]) Care Management  [place] 10/09/2018  ARLANDER MAGNON 1945-06-25 [id number]  Reason for call: f/u on insulin therapy  Communication received from Dr. Josephine Cables office that ok to continue with patient assistance application process for Basaglar.  Dr. Legrand Rams will f/u with patient directly about insulin regimen and decision regarding SSI.  If SSI continued, can add Humalog to application as this is made by same company as [employer].    Outreach:  Unsuccessful telephone call attempt #2 to patient's daughter. HIPAA compliant voicemail left requesting a return call  Plan:  -I will make another outreach attempt to patient within 3-4 business days.    Ralene Bathe, PharmD, [place] [phone]

## 2018-10-10 ENCOUNTER — Emergency Department (HOSPITAL_COMMUNITY)
Admission: EM | Admit: 2018-10-10 | Discharge: 2018-10-10 | Disposition: A | Discharge status: Home / Self Care | Payer: Medicare Other | Attending: Emergency Medicine | Admitting: Emergency Medicine

## 2018-10-10 ENCOUNTER — Encounter (HOSPITAL_COMMUNITY): Payer: Self-pay | Admitting: Emergency Medicine

## 2018-10-10 ENCOUNTER — Other Ambulatory Visit: Payer: Self-pay

## 2018-10-10 ENCOUNTER — Emergency Department (HOSPITAL_COMMUNITY): Payer: Medicare Other

## 2018-10-10 DIAGNOSIS — R42 Dizziness and giddiness: Secondary | ICD-10-CM | POA: Diagnosis not present

## 2018-10-10 DIAGNOSIS — E1122 Type 2 diabetes mellitus with diabetic chronic kidney disease: Secondary | ICD-10-CM | POA: Insufficient documentation

## 2018-10-10 DIAGNOSIS — Z7982 Long term (current) use of aspirin: Secondary | ICD-10-CM | POA: Insufficient documentation

## 2018-10-10 DIAGNOSIS — J90 Pleural effusion, not elsewhere classified: Secondary | ICD-10-CM | POA: Diagnosis not present

## 2018-10-10 DIAGNOSIS — I129 Hypertensive chronic kidney disease with stage 1 through stage 4 chronic kidney disease, or unspecified chronic kidney disease: Secondary | ICD-10-CM | POA: Diagnosis not present

## 2018-10-10 DIAGNOSIS — F17210 Nicotine dependence, cigarettes, uncomplicated: Secondary | ICD-10-CM | POA: Diagnosis not present

## 2018-10-10 DIAGNOSIS — I9589 Other hypotension: Secondary | ICD-10-CM | POA: Diagnosis not present

## 2018-10-10 DIAGNOSIS — Z79899 Other long term (current) drug therapy: Secondary | ICD-10-CM | POA: Diagnosis not present

## 2018-10-10 DIAGNOSIS — Z7901 Long term (current) use of anticoagulants: Secondary | ICD-10-CM | POA: Insufficient documentation

## 2018-10-10 DIAGNOSIS — I951 Orthostatic hypotension: Secondary | ICD-10-CM | POA: Insufficient documentation

## 2018-10-10 DIAGNOSIS — N183 Chronic kidney disease, stage 3 (moderate): Secondary | ICD-10-CM | POA: Diagnosis not present

## 2018-10-10 DIAGNOSIS — I739 Peripheral vascular disease, unspecified: Secondary | ICD-10-CM | POA: Insufficient documentation

## 2018-10-10 DIAGNOSIS — I10 Essential (primary) hypertension: Secondary | ICD-10-CM | POA: Diagnosis not present

## 2018-10-10 DIAGNOSIS — I7090 Unspecified atherosclerosis: Secondary | ICD-10-CM | POA: Diagnosis not present

## 2018-10-10 DIAGNOSIS — Z794 Long term (current) use of insulin: Secondary | ICD-10-CM | POA: Diagnosis not present

## 2018-10-10 DIAGNOSIS — R55 Syncope and collapse: Secondary | ICD-10-CM | POA: Diagnosis not present

## 2018-10-10 DIAGNOSIS — E1151 Type 2 diabetes mellitus with diabetic peripheral angiopathy without gangrene: Secondary | ICD-10-CM | POA: Diagnosis not present

## 2018-10-10 DIAGNOSIS — I69351 Hemiplegia and hemiparesis following cerebral infarction affecting right dominant side: Secondary | ICD-10-CM | POA: Diagnosis not present

## 2018-10-10 DIAGNOSIS — E1143 Type 2 diabetes mellitus with diabetic autonomic (poly)neuropathy: Secondary | ICD-10-CM | POA: Diagnosis not present

## 2018-10-10 LAB — CBC WITH DIFFERENTIAL/PLATELET
Abs Immature Granulocytes: 0.02 10*3/uL (ref 0.00–0.07)
Basophils Absolute: 0 10*3/uL (ref 0.0–0.1)
Basophils Relative: 1 %
Eosinophils Absolute: 0.1 10*3/uL (ref 0.0–0.5)
Eosinophils Relative: 2 %
HCT: 28.9 % — ABNORMAL LOW (ref 39.0–52.0)
Hemoglobin: 9.2 g/dL — ABNORMAL LOW (ref 13.0–17.0)
Immature Granulocytes: 0 %
Lymphocytes Relative: 27 %
Lymphs Abs: 1.5 10*3/uL (ref 0.7–4.0)
MCH: 28 pg (ref 26.0–34.0)
MCHC: 31.8 g/dL (ref 30.0–36.0)
MCV: 88.1 fL (ref 80.0–100.0)
Monocytes Absolute: 0.6 10*3/uL (ref 0.1–1.0)
Monocytes Relative: 12 %
Neutro Abs: 3.1 10*3/uL (ref 1.7–7.7)
Neutrophils Relative %: 58 %
Platelets: 311 10*3/uL (ref 150–400)
RBC: 3.28 MIL/uL — ABNORMAL LOW (ref 4.22–5.81)
RDW: 19.1 % — ABNORMAL HIGH (ref 11.5–15.5)
WBC: 5.4 10*3/uL (ref 4.0–10.5)
nRBC: 0 % (ref 0.0–0.2)

## 2018-10-10 LAB — BASIC METABOLIC PANEL
Anion gap: 7 (ref 5–15)
BUN: 29 mg/dL — ABNORMAL HIGH (ref 8–23)
CO2: 23 mmol/L (ref 22–32)
Calcium: 8.1 mg/dL — ABNORMAL LOW (ref 8.9–10.3)
Chloride: 105 mmol/L (ref 98–111)
Creatinine, Ser: 1.78 mg/dL — ABNORMAL HIGH (ref 0.61–1.24)
GFR calc Af Amer: 44 mL/min — ABNORMAL LOW (ref >60)
GFR calc non Af Amer: 38 mL/min — ABNORMAL LOW (ref >60)
Glucose, Bld: 154 mg/dL — ABNORMAL HIGH (ref 70–99)
Potassium: 3.7 mmol/L (ref 3.5–5.1)
Sodium: 135 mmol/L (ref 135–145)

## 2018-10-10 LAB — CBG MONITORING, ED
Glucose-Capillary: 104 mg/dL — ABNORMAL HIGH (ref 70–99)
Glucose-Capillary: 148 mg/dL — ABNORMAL HIGH (ref 70–99)

## 2018-10-10 MED ORDER — SODIUM CHLORIDE 0.9 % IV BOLUS
500.0000 mL | Freq: Once | INTRAVENOUS | Status: AC
  Administered 2018-10-10: 500 mL via INTRAVENOUS

## 2018-10-10 MED ORDER — SODIUM CHLORIDE 0.9 % IV BOLUS
500.0000 mL | Freq: Once | INTRAVENOUS | Status: AC
  Administered 2018-10-10: 19:00:00 500 mL via INTRAVENOUS

## 2018-10-10 NOTE — ED Triage Notes (Signed)
Pt BIB [facility] from home. Pt had near syncopal episode witnessed by family. Pt has history of orthostatic hypotension. Pt A&Ox4 and back to baseline upon EMS arrival. VSS.

## 2018-10-10 NOTE — ED Notes (Signed)
BP reflected in chart following ambulation in hallway as requested by EDP.

## 2018-10-10 NOTE — Discharge Instructions (Addendum)
Please see the information and instructions below regarding your visit.  Your diagnoses today include:  1. Near syncope   2. Orthostasis     Tests performed today include: See side panel of your discharge paperwork for testing performed today. Vital signs are listed at the bottom of these instructions.   Medications prescribed:    Take any prescribed medications only as prescribed, and any over the counter medications only as directed on the packaging.  Home care instructions:  Please follow any educational materials contained in this packet.   Follow-up instructions: Please follow-up with your primary care provider in 5-7 days for further evaluation of your symptoms if they are not completely improved.    Return instructions:  Please return to the Emergency Department if you experience worsening symptoms.  Please come back to the emergency department for any further passing out episodes, episodes of dehydration, chest pain, shortness of breath, feeling dizzy or lightheaded or changes in mental status. Please return if you have any other emergent concerns.  Additional Information:   Your vital signs today were: BP 138/70 (BP Location: Left Arm)    Pulse 98    Temp 98.2 F (36.8 C) (Oral)    Resp 16    Ht 6' (1.829 m)    Wt 79.4 kg    SpO2 100%    BMI 23.73 kg/m  If your blood pressure (BP) was elevated on multiple readings during this visit above 130 for the top number or above 80 for the bottom number, please have this repeated by your primary care provider within one month. --------------  Thank you for allowing [place] to participate in your care today.

## 2018-10-10 NOTE — ED Notes (Signed)
Lab called stating BMP is hemolyzed and to recollect.
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
## 2018-10-13 ENCOUNTER — Ambulatory Visit: Payer: Self-pay | Admitting: Pharmacist

## 2018-10-13 ENCOUNTER — Other Ambulatory Visit: Payer: Self-pay | Admitting: Pharmacy Technician

## 2018-10-13 ENCOUNTER — Other Ambulatory Visit: Payer: Self-pay | Admitting: Pharmacist

## 2018-10-13 ENCOUNTER — Other Ambulatory Visit: Payer: Self-pay | Admitting: *Deleted

## 2018-10-13 DIAGNOSIS — E1151 Type 2 diabetes mellitus with diabetic peripheral angiopathy without gangrene: Secondary | ICD-10-CM | POA: Diagnosis not present

## 2018-10-13 DIAGNOSIS — I10 Essential (primary) hypertension: Secondary | ICD-10-CM | POA: Diagnosis not present

## 2018-10-13 DIAGNOSIS — I9589 Other hypotension: Secondary | ICD-10-CM | POA: Diagnosis not present

## 2018-10-13 DIAGNOSIS — I7090 Unspecified atherosclerosis: Secondary | ICD-10-CM | POA: Diagnosis not present

## 2018-10-13 DIAGNOSIS — E1143 Type 2 diabetes mellitus with diabetic autonomic (poly)neuropathy: Secondary | ICD-10-CM | POA: Diagnosis not present

## 2018-10-13 DIAGNOSIS — I69351 Hemiplegia and hemiparesis following cerebral infarction affecting right dominant side: Secondary | ICD-10-CM | POA: Diagnosis not present

## 2018-10-13 NOTE — Patient Outreach (Signed)
[facility]) Care Management  [place] 10/13/2018  PENG HINESLEY 1945/08/31 [id number]  Reason for call: f/u on insulin  Successful call with Mr. Levi daughter.  She reports she received the [facility] e-mail with [employer] insulin patient assistance program application but she forgot to complete it.  She still has access to the e-mail and will try to complete it today.  She is aware she needs to also submit proof of income and copy of insurance card.  She reports that Dr. Legrand Rams is continuing SSI for now and therefore she would like to add short-acting insulin to application if possible.  [facility] makes Humalog in addition to [employer] which can be included with application.   Plan: Request [facility] pharmacy technician to fax application to Dr. Legrand Rams for Humalog.   Ralene Bathe, PharmD, [place] [phone]

## 2018-10-13 NOTE — Patient Outreach (Signed)
[facility]) Care Management  10/13/2018  Harry [name] [birth year] [id number]                          Medication Assistance Referral  Referral From: [name]  Medication/Company: Humalog / [employer] Patient application portion:  N/A patient has Research scientist (life sciences) portion: Faxed  to Dr. Truett Mainland   Per in-basket message from [place] patient confirmed receiving [employer] patient assistance application for [employer].  Will submit to [employer] once all documents have been received.  [name] Chana Bode [place] Certified Pharmacy Technician [place] Management Direct Dial:[phone]

## 2018-10-13 NOTE — Patient Outreach (Signed)
[facility]) Care Management  10/13/2018  BRISCO BOLEYN May 04, 1945 [id number]   CSW made a second attempt to try and contact patient's daughter, [name] today to perform the initial phone assessment on patient, as well as assess and assist with social work needs and services; however, [name] was not available at the time of CSW's call.  A HIPAA compliant message was left for [name] on voicemail.  CSW continues to await a return call.  CSW will make a third and final outreach attempt within the next 3-4 business days, if a return call is not received from [name] in the meantime.  CSW will then proceed with case closure if a return call is not received from [name] within a total of 10 business days, as required number of phone attempts will have been made and outreach letter mailed.   Nat Christen, BSW, MSW, LCSW  Licensed Education officer, [facility]  Mailing [place] [address] [place], [place] [address] Physical Address-[address], [place], [place] [address] Toll Free Main # [phone] Fax # [phone] Cell # [phone]  Office # [phone] [email or web address]

## 2018-10-15 ENCOUNTER — Other Ambulatory Visit: Payer: Self-pay | Admitting: *Deleted

## 2018-10-15 DIAGNOSIS — I9589 Other hypotension: Secondary | ICD-10-CM | POA: Diagnosis not present

## 2018-10-15 DIAGNOSIS — I10 Essential (primary) hypertension: Secondary | ICD-10-CM | POA: Diagnosis not present

## 2018-10-15 DIAGNOSIS — E1151 Type 2 diabetes mellitus with diabetic peripheral angiopathy without gangrene: Secondary | ICD-10-CM | POA: Diagnosis not present

## 2018-10-15 DIAGNOSIS — E1143 Type 2 diabetes mellitus with diabetic autonomic (poly)neuropathy: Secondary | ICD-10-CM | POA: Diagnosis not present

## 2018-10-15 DIAGNOSIS — I7090 Unspecified atherosclerosis: Secondary | ICD-10-CM | POA: Diagnosis not present

## 2018-10-15 DIAGNOSIS — I69351 Hemiplegia and hemiparesis following cerebral infarction affecting right dominant side: Secondary | ICD-10-CM | POA: Diagnosis not present

## 2018-10-15 NOTE — Patient Outreach (Signed)
Harry [name]) Care Management  10/15/2018  Harry [name] [birth year] [id number]   Call placed to [facility] to follow up on referral for home based palliative care.  Notified that member's case was closed due to admission to [name].  Call then placed to member's daughter Harry [name] to verify where member is currently as he was home last week. She state she's unable to talk at this time, will call this care manager when she is available.  Will await call back, if no call back will follow up next week.  Harry [name], [place], MSN [place] [phone]

## 2018-10-16 DIAGNOSIS — I69351 Hemiplegia and hemiparesis following cerebral infarction affecting right dominant side: Secondary | ICD-10-CM | POA: Diagnosis not present

## 2018-10-16 DIAGNOSIS — I7090 Unspecified atherosclerosis: Secondary | ICD-10-CM | POA: Diagnosis not present

## 2018-10-16 DIAGNOSIS — I10 Essential (primary) hypertension: Secondary | ICD-10-CM | POA: Diagnosis not present

## 2018-10-16 DIAGNOSIS — I9589 Other hypotension: Secondary | ICD-10-CM | POA: Diagnosis not present

## 2018-10-16 DIAGNOSIS — E1151 Type 2 diabetes mellitus with diabetic peripheral angiopathy without gangrene: Secondary | ICD-10-CM | POA: Diagnosis not present

## 2018-10-16 DIAGNOSIS — E1143 Type 2 diabetes mellitus with diabetic autonomic (poly)neuropathy: Secondary | ICD-10-CM | POA: Diagnosis not present

## 2018-10-17 ENCOUNTER — Telehealth: Payer: Self-pay | Admitting: *Deleted

## 2018-10-17 DIAGNOSIS — E1151 Type 2 diabetes mellitus with diabetic peripheral angiopathy without gangrene: Secondary | ICD-10-CM | POA: Diagnosis not present

## 2018-10-17 DIAGNOSIS — E1143 Type 2 diabetes mellitus with diabetic autonomic (poly)neuropathy: Secondary | ICD-10-CM | POA: Diagnosis not present

## 2018-10-17 DIAGNOSIS — I69351 Hemiplegia and hemiparesis following cerebral infarction affecting right dominant side: Secondary | ICD-10-CM | POA: Diagnosis not present

## 2018-10-17 DIAGNOSIS — I9589 Other hypotension: Secondary | ICD-10-CM | POA: Diagnosis not present

## 2018-10-17 DIAGNOSIS — I10 Essential (primary) hypertension: Secondary | ICD-10-CM | POA: Diagnosis not present

## 2018-10-17 DIAGNOSIS — I7090 Unspecified atherosclerosis: Secondary | ICD-10-CM | POA: Diagnosis not present

## 2018-10-17 NOTE — Patient Outreach (Signed)
[facility]) Care Management  10/17/2018  Harry [name] 1945/04/02 [id number]   CSW (covering for [facility] CSW, Di Kindle) made a third & final attempt reach patient's daughter, [name] to follow-up on referral for ALF placement but patient's daughter did not answer - phone rang once and went to voicemail, but mailbox was full and CSW was unable to leave a voicemail. CSW previously had  unsuccessful outreach letter mailed out on 10/07/18. CSW will close case if no response in 4 days.    Raynaldo Opitz, LCSW [facility]  Clinical Social Worker cell #: [phone]

## 2018-10-20 ENCOUNTER — Other Ambulatory Visit: Payer: Self-pay | Admitting: Cardiovascular Disease

## 2018-10-20 ENCOUNTER — Encounter: Payer: Self-pay | Admitting: *Deleted

## 2018-10-20 ENCOUNTER — Other Ambulatory Visit: Payer: Self-pay | Admitting: *Deleted

## 2018-10-20 NOTE — Patient Outreach (Signed)
[place] [facility]) Care Management  10/20/2018  Harry [name] 04/29/1945 [id number]   CSW will perform a case closure on patient, due to inability to establish initial phone contact, despite required number of phone attempts made and outreach letter mailed to patient's home, allowing 10 business days for a response if patient was interested in receiving social work services through [place] with [facility].  CSW will notify patient's RNCM with [place] Management, Valente David of CSW's plans to close patient's case.  CSW will fax an update to patient's Primary Care Physician, Dr. [name] to ensure that they are aware of CSW's involvement with patient's plan of care.    Nat Christen, BSW, MSW, LCSW  Licensed Education officer, [facility]  Mailing [place] [address] [place], [place] [address] Physical Address-[address], [place], [place] [address] Toll Free Main # [phone] Fax # [phone] Cell # [phone]  Office # [phone] [email or web address]

## 2018-10-21 DIAGNOSIS — I69351 Hemiplegia and hemiparesis following cerebral infarction affecting right dominant side: Secondary | ICD-10-CM | POA: Diagnosis not present

## 2018-10-21 DIAGNOSIS — E1143 Type 2 diabetes mellitus with diabetic autonomic (poly)neuropathy: Secondary | ICD-10-CM | POA: Diagnosis not present

## 2018-10-21 DIAGNOSIS — I9589 Other hypotension: Secondary | ICD-10-CM | POA: Diagnosis not present

## 2018-10-21 DIAGNOSIS — I7090 Unspecified atherosclerosis: Secondary | ICD-10-CM | POA: Diagnosis not present

## 2018-10-21 DIAGNOSIS — I10 Essential (primary) hypertension: Secondary | ICD-10-CM | POA: Diagnosis not present

## 2018-10-21 DIAGNOSIS — E1151 Type 2 diabetes mellitus with diabetic peripheral angiopathy without gangrene: Secondary | ICD-10-CM | POA: Diagnosis not present

## 2018-10-22 ENCOUNTER — Other Ambulatory Visit: Payer: Self-pay | Admitting: *Deleted

## 2018-10-22 DIAGNOSIS — I9589 Other hypotension: Secondary | ICD-10-CM | POA: Diagnosis not present

## 2018-10-22 DIAGNOSIS — E1151 Type 2 diabetes mellitus with diabetic peripheral angiopathy without gangrene: Secondary | ICD-10-CM | POA: Diagnosis not present

## 2018-10-22 DIAGNOSIS — I69351 Hemiplegia and hemiparesis following cerebral infarction affecting right dominant side: Secondary | ICD-10-CM | POA: Diagnosis not present

## 2018-10-22 DIAGNOSIS — E1143 Type 2 diabetes mellitus with diabetic autonomic (poly)neuropathy: Secondary | ICD-10-CM | POA: Diagnosis not present

## 2018-10-22 DIAGNOSIS — I7090 Unspecified atherosclerosis: Secondary | ICD-10-CM | POA: Diagnosis not present

## 2018-10-22 DIAGNOSIS — I10 Essential (primary) hypertension: Secondary | ICD-10-CM | POA: Diagnosis not present

## 2018-10-22 NOTE — Patient Outreach (Signed)
[facility]) Care Management  10/22/2018  Harry [name] 1945/09/15 [id number]   Weekly transition of care call placed to [facility] caregiver/daughter.  No answer, HIPAA compliant voice message left.  Will follow up within the next 3-4 business days.  Valente David, [place], MSN [place] [phone]

## 2018-10-24 DIAGNOSIS — I9589 Other hypotension: Secondary | ICD-10-CM | POA: Diagnosis not present

## 2018-10-24 DIAGNOSIS — E1151 Type 2 diabetes mellitus with diabetic peripheral angiopathy without gangrene: Secondary | ICD-10-CM | POA: Diagnosis not present

## 2018-10-24 DIAGNOSIS — I10 Essential (primary) hypertension: Secondary | ICD-10-CM | POA: Diagnosis not present

## 2018-10-24 DIAGNOSIS — I69351 Hemiplegia and hemiparesis following cerebral infarction affecting right dominant side: Secondary | ICD-10-CM | POA: Diagnosis not present

## 2018-10-24 DIAGNOSIS — I7090 Unspecified atherosclerosis: Secondary | ICD-10-CM | POA: Diagnosis not present

## 2018-10-24 DIAGNOSIS — E1143 Type 2 diabetes mellitus with diabetic autonomic (poly)neuropathy: Secondary | ICD-10-CM | POA: Diagnosis not present

## 2018-10-27 DIAGNOSIS — I69351 Hemiplegia and hemiparesis following cerebral infarction affecting right dominant side: Secondary | ICD-10-CM | POA: Diagnosis not present

## 2018-10-27 DIAGNOSIS — E1143 Type 2 diabetes mellitus with diabetic autonomic (poly)neuropathy: Secondary | ICD-10-CM | POA: Diagnosis not present

## 2018-10-27 DIAGNOSIS — I9589 Other hypotension: Secondary | ICD-10-CM | POA: Diagnosis not present

## 2018-10-27 DIAGNOSIS — I7090 Unspecified atherosclerosis: Secondary | ICD-10-CM | POA: Diagnosis not present

## 2018-10-27 DIAGNOSIS — E1151 Type 2 diabetes mellitus with diabetic peripheral angiopathy without gangrene: Secondary | ICD-10-CM | POA: Diagnosis not present

## 2018-10-27 DIAGNOSIS — I10 Essential (primary) hypertension: Secondary | ICD-10-CM | POA: Diagnosis not present

## 2018-10-28 ENCOUNTER — Other Ambulatory Visit: Payer: Self-pay | Admitting: *Deleted

## 2018-10-28 DIAGNOSIS — I7090 Unspecified atherosclerosis: Secondary | ICD-10-CM | POA: Diagnosis not present

## 2018-10-28 DIAGNOSIS — I10 Essential (primary) hypertension: Secondary | ICD-10-CM | POA: Diagnosis not present

## 2018-10-28 DIAGNOSIS — E1143 Type 2 diabetes mellitus with diabetic autonomic (poly)neuropathy: Secondary | ICD-10-CM | POA: Diagnosis not present

## 2018-10-28 DIAGNOSIS — I69351 Hemiplegia and hemiparesis following cerebral infarction affecting right dominant side: Secondary | ICD-10-CM | POA: Diagnosis not present

## 2018-10-28 DIAGNOSIS — E1151 Type 2 diabetes mellitus with diabetic peripheral angiopathy without gangrene: Secondary | ICD-10-CM | POA: Diagnosis not present

## 2018-10-28 DIAGNOSIS — I9589 Other hypotension: Secondary | ICD-10-CM | POA: Diagnosis not present

## 2018-10-28 NOTE — Patient Outreach (Signed)
[facility]) Care Management  10/28/2018  Harry [name] 1945/10/31 [id number]   Unsuccessful outreach #2.  Weekly transition of care call placed to member's daughter, no answer.  HIPAA compliant voice message left.  Unsuccessful outreach letter sent, will follow up within the next 3-4 business days.  Valente David, [place], MSN [place] [phone]

## 2018-10-29 DIAGNOSIS — I10 Essential (primary) hypertension: Secondary | ICD-10-CM | POA: Diagnosis not present

## 2018-10-29 DIAGNOSIS — E1151 Type 2 diabetes mellitus with diabetic peripheral angiopathy without gangrene: Secondary | ICD-10-CM | POA: Diagnosis not present

## 2018-10-29 DIAGNOSIS — I69351 Hemiplegia and hemiparesis following cerebral infarction affecting right dominant side: Secondary | ICD-10-CM | POA: Diagnosis not present

## 2018-10-29 DIAGNOSIS — I9589 Other hypotension: Secondary | ICD-10-CM | POA: Diagnosis not present

## 2018-10-29 DIAGNOSIS — I7090 Unspecified atherosclerosis: Secondary | ICD-10-CM | POA: Diagnosis not present

## 2018-10-29 DIAGNOSIS — E1143 Type 2 diabetes mellitus with diabetic autonomic (poly)neuropathy: Secondary | ICD-10-CM | POA: Diagnosis not present

## 2018-10-30 DIAGNOSIS — I69351 Hemiplegia and hemiparesis following cerebral infarction affecting right dominant side: Secondary | ICD-10-CM | POA: Diagnosis not present

## 2018-10-30 DIAGNOSIS — I9589 Other hypotension: Secondary | ICD-10-CM | POA: Diagnosis not present

## 2018-10-30 DIAGNOSIS — I10 Essential (primary) hypertension: Secondary | ICD-10-CM | POA: Diagnosis not present

## 2018-10-30 DIAGNOSIS — I7090 Unspecified atherosclerosis: Secondary | ICD-10-CM | POA: Diagnosis not present

## 2018-10-30 DIAGNOSIS — E1151 Type 2 diabetes mellitus with diabetic peripheral angiopathy without gangrene: Secondary | ICD-10-CM | POA: Diagnosis not present

## 2018-10-30 DIAGNOSIS — E1143 Type 2 diabetes mellitus with diabetic autonomic (poly)neuropathy: Secondary | ICD-10-CM | POA: Diagnosis not present

## 2018-10-31 ENCOUNTER — Other Ambulatory Visit: Payer: Self-pay | Admitting: *Deleted

## 2018-10-31 ENCOUNTER — Encounter: Payer: Self-pay | Admitting: *Deleted

## 2018-10-31 DIAGNOSIS — E785 Hyperlipidemia, unspecified: Secondary | ICD-10-CM | POA: Diagnosis not present

## 2018-10-31 DIAGNOSIS — Z794 Long term (current) use of insulin: Secondary | ICD-10-CM | POA: Diagnosis not present

## 2018-10-31 DIAGNOSIS — I10 Essential (primary) hypertension: Secondary | ICD-10-CM | POA: Diagnosis not present

## 2018-10-31 DIAGNOSIS — M4800 Spinal stenosis, site unspecified: Secondary | ICD-10-CM | POA: Diagnosis not present

## 2018-10-31 DIAGNOSIS — F17210 Nicotine dependence, cigarettes, uncomplicated: Secondary | ICD-10-CM | POA: Diagnosis not present

## 2018-10-31 DIAGNOSIS — E1143 Type 2 diabetes mellitus with diabetic autonomic (poly)neuropathy: Secondary | ICD-10-CM | POA: Diagnosis not present

## 2018-10-31 DIAGNOSIS — I7090 Unspecified atherosclerosis: Secondary | ICD-10-CM | POA: Diagnosis not present

## 2018-10-31 DIAGNOSIS — Z7982 Long term (current) use of aspirin: Secondary | ICD-10-CM | POA: Diagnosis not present

## 2018-10-31 DIAGNOSIS — Z9181 History of falling: Secondary | ICD-10-CM | POA: Diagnosis not present

## 2018-10-31 DIAGNOSIS — E1151 Type 2 diabetes mellitus with diabetic peripheral angiopathy without gangrene: Secondary | ICD-10-CM | POA: Diagnosis not present

## 2018-10-31 DIAGNOSIS — I69351 Hemiplegia and hemiparesis following cerebral infarction affecting right dominant side: Secondary | ICD-10-CM | POA: Diagnosis not present

## 2018-10-31 DIAGNOSIS — Z87440 Personal history of urinary (tract) infections: Secondary | ICD-10-CM | POA: Diagnosis not present

## 2018-10-31 DIAGNOSIS — F0280 Dementia in other diseases classified elsewhere without behavioral disturbance: Secondary | ICD-10-CM | POA: Diagnosis not present

## 2018-10-31 DIAGNOSIS — G3183 Dementia with Lewy bodies: Secondary | ICD-10-CM | POA: Diagnosis not present

## 2018-10-31 DIAGNOSIS — I9589 Other hypotension: Secondary | ICD-10-CM | POA: Diagnosis not present

## 2018-10-31 NOTE — Patient Outreach (Addendum)
[name]) Care Management [place], Transition of Care day 35 Post-SNF discharge day # 36 Unsuccessful consecutive outreach attempt # 3 10/31/2018  Harry JOHANNSEN [birth year] [id number]  1:30 pm:  Unsuccessful outreach to patient's caregiver for transition of care, in coverage for patient's primary [place]; patient was admitted to hospital 6/28-6/30 for autonomic neuropathy resulting in orthostatic hypotension.  Per chart, he also has history of hypertension, PAD, stroke, diabetes, dementia, CKD, and hyperlipidemia.  Per notes, daughter [name] is contact person.  HIPAA compliant voice mail message left for patient's caregiver, requesting return call back.  Plan:  Verified that [place] unsuccessful patient outreach letter was placed in mail on 10/28/2018, requesting call back in writing  Will make patient's primary [place] CM aware of today's unsuccessful outreach to caregiver  Oneta Rack, RN, BSN, [place] Care Management  [phone]

## 2018-11-03 DIAGNOSIS — I10 Essential (primary) hypertension: Secondary | ICD-10-CM | POA: Diagnosis not present

## 2018-11-03 DIAGNOSIS — I7090 Unspecified atherosclerosis: Secondary | ICD-10-CM | POA: Diagnosis not present

## 2018-11-03 DIAGNOSIS — I69351 Hemiplegia and hemiparesis following cerebral infarction affecting right dominant side: Secondary | ICD-10-CM | POA: Diagnosis not present

## 2018-11-03 DIAGNOSIS — E1143 Type 2 diabetes mellitus with diabetic autonomic (poly)neuropathy: Secondary | ICD-10-CM | POA: Diagnosis not present

## 2018-11-03 DIAGNOSIS — E1151 Type 2 diabetes mellitus with diabetic peripheral angiopathy without gangrene: Secondary | ICD-10-CM | POA: Diagnosis not present

## 2018-11-03 DIAGNOSIS — I9589 Other hypotension: Secondary | ICD-10-CM | POA: Diagnosis not present

## 2018-11-05 ENCOUNTER — Other Ambulatory Visit: Payer: Self-pay | Admitting: Pharmacy Technician

## 2018-11-05 DIAGNOSIS — I69351 Hemiplegia and hemiparesis following cerebral infarction affecting right dominant side: Secondary | ICD-10-CM | POA: Diagnosis not present

## 2018-11-05 DIAGNOSIS — I9589 Other hypotension: Secondary | ICD-10-CM | POA: Diagnosis not present

## 2018-11-05 DIAGNOSIS — E1143 Type 2 diabetes mellitus with diabetic autonomic (poly)neuropathy: Secondary | ICD-10-CM | POA: Diagnosis not present

## 2018-11-05 DIAGNOSIS — I7090 Unspecified atherosclerosis: Secondary | ICD-10-CM | POA: Diagnosis not present

## 2018-11-05 DIAGNOSIS — I10 Essential (primary) hypertension: Secondary | ICD-10-CM | POA: Diagnosis not present

## 2018-11-05 DIAGNOSIS — E1151 Type 2 diabetes mellitus with diabetic peripheral angiopathy without gangrene: Secondary | ICD-10-CM | POA: Diagnosis not present

## 2018-11-05 NOTE — Patient Outreach (Signed)
[place] [facility] [facility]) Care Management  11/05/2018  VASTINE ROEBUCK [birth year] [id number]    Unsuccessful call placed to patients daughter, [name] regarding patient assistance application(s) for [employer] and Humalog , HIPAA compliant voicemail left.   Follow up:  Will make additional call attempt in 3-5 business days if call has not been returned.  [name] Chana Bode [place] Certified Pharmacy Technician [place] Management Direct [phone]

## 2018-11-06 DIAGNOSIS — I7090 Unspecified atherosclerosis: Secondary | ICD-10-CM | POA: Diagnosis not present

## 2018-11-06 DIAGNOSIS — I9589 Other hypotension: Secondary | ICD-10-CM | POA: Diagnosis not present

## 2018-11-06 DIAGNOSIS — I10 Essential (primary) hypertension: Secondary | ICD-10-CM | POA: Diagnosis not present

## 2018-11-06 DIAGNOSIS — I69351 Hemiplegia and hemiparesis following cerebral infarction affecting right dominant side: Secondary | ICD-10-CM | POA: Diagnosis not present

## 2018-11-06 DIAGNOSIS — E1143 Type 2 diabetes mellitus with diabetic autonomic (poly)neuropathy: Secondary | ICD-10-CM | POA: Diagnosis not present

## 2018-11-06 DIAGNOSIS — E1151 Type 2 diabetes mellitus with diabetic peripheral angiopathy without gangrene: Secondary | ICD-10-CM | POA: Diagnosis not present

## 2018-11-07 ENCOUNTER — Other Ambulatory Visit: Payer: Self-pay | Admitting: Pharmacy Technician

## 2018-11-07 NOTE — Patient Outreach (Signed)
[place] [facility]) Care Management  11/07/2018  TAVIST HUDNALL [birth year] [id number]   Received patien portion(s) of patient assistance application for Basaglar and Humalog. Faxed completed application and required documents into [employer] for [employer].  Contacted Ebonee at Dr. Josephine Cables office in regards to missing provider portion for Humalog. E[employer]e requested I refax application to office.  Will follow up with company(ies) in 5-7 business days to check status of Basaglar application(s).  Will fax provider portion of Humalog to [facility] once document has been received.  [name] Chana Bode [place] Certified Pharmacy Technician [place] Management Direct Dial:[phone]

## 2018-11-11 ENCOUNTER — Other Ambulatory Visit: Payer: Self-pay | Admitting: *Deleted

## 2018-11-11 DIAGNOSIS — E1143 Type 2 diabetes mellitus with diabetic autonomic (poly)neuropathy: Secondary | ICD-10-CM | POA: Diagnosis not present

## 2018-11-11 DIAGNOSIS — I10 Essential (primary) hypertension: Secondary | ICD-10-CM | POA: Diagnosis not present

## 2018-11-11 DIAGNOSIS — I7090 Unspecified atherosclerosis: Secondary | ICD-10-CM | POA: Diagnosis not present

## 2018-11-11 DIAGNOSIS — E1151 Type 2 diabetes mellitus with diabetic peripheral angiopathy without gangrene: Secondary | ICD-10-CM | POA: Diagnosis not present

## 2018-11-11 DIAGNOSIS — I69351 Hemiplegia and hemiparesis following cerebral infarction affecting right dominant side: Secondary | ICD-10-CM | POA: Diagnosis not present

## 2018-11-11 DIAGNOSIS — I9589 Other hypotension: Secondary | ICD-10-CM | POA: Diagnosis not present

## 2018-11-11 NOTE — Patient Outreach (Signed)
[place] [facility]) Care Management  11/11/2018  Harry [name] 1945/05/17 [id number]   No response from member after multiple unsuccessful outreach attempts and letter sent.  Will close case at this time due to inability to maintain contact.  Will notify member and primary MD of case closure.  THN CM Care Plan Problem One     Most Recent Value  Care Plan Problem One  Risk for readmission related to hypotension/fall as evidenced by recent hospitalization requiring SNF stay for rehab  Role Documenting the Problem One  Care Management [place] for Problem One  Not Active  [facility] Long Term Goal   Member will not be readmitted to hospital within the next 31 days  THN Long Term Goal Start Date  09/26/18  [facility] Long Term Goal Met Date  11/11/18  [facility] CM Short Term Goal #1   Daughter will report contact with [facility] within the next 2 weeks  THN CM Short Term Goal #1 Start Date  09/26/18  [facility] CM Short Term Goal #2   Daughter will report follow up appointment with primary MD within the next 2 weeks  THN CM Short Term Goal #2 Start Date  09/26/18  [facility] CM Short Term Goal #2 Met Date  10/08/18     Valente David, RN, MSN [place] [phone]

## 2018-11-12 DIAGNOSIS — I7090 Unspecified atherosclerosis: Secondary | ICD-10-CM | POA: Diagnosis not present

## 2018-11-12 DIAGNOSIS — I9589 Other hypotension: Secondary | ICD-10-CM | POA: Diagnosis not present

## 2018-11-12 DIAGNOSIS — I69351 Hemiplegia and hemiparesis following cerebral infarction affecting right dominant side: Secondary | ICD-10-CM | POA: Diagnosis not present

## 2018-11-12 DIAGNOSIS — E1151 Type 2 diabetes mellitus with diabetic peripheral angiopathy without gangrene: Secondary | ICD-10-CM | POA: Diagnosis not present

## 2018-11-12 DIAGNOSIS — I10 Essential (primary) hypertension: Secondary | ICD-10-CM | POA: Diagnosis not present

## 2018-11-12 DIAGNOSIS — E1143 Type 2 diabetes mellitus with diabetic autonomic (poly)neuropathy: Secondary | ICD-10-CM | POA: Diagnosis not present

## 2018-11-13 DIAGNOSIS — I9589 Other hypotension: Secondary | ICD-10-CM | POA: Diagnosis not present

## 2018-11-13 DIAGNOSIS — E1151 Type 2 diabetes mellitus with diabetic peripheral angiopathy without gangrene: Secondary | ICD-10-CM | POA: Diagnosis not present

## 2018-11-13 DIAGNOSIS — I69351 Hemiplegia and hemiparesis following cerebral infarction affecting right dominant side: Secondary | ICD-10-CM | POA: Diagnosis not present

## 2018-11-13 DIAGNOSIS — I10 Essential (primary) hypertension: Secondary | ICD-10-CM | POA: Diagnosis not present

## 2018-11-13 DIAGNOSIS — E1143 Type 2 diabetes mellitus with diabetic autonomic (poly)neuropathy: Secondary | ICD-10-CM | POA: Diagnosis not present

## 2018-11-13 DIAGNOSIS — I7090 Unspecified atherosclerosis: Secondary | ICD-10-CM | POA: Diagnosis not present

## 2018-11-14 ENCOUNTER — Other Ambulatory Visit: Payer: Self-pay | Admitting: Pharmacy Technician

## 2018-11-14 NOTE — Patient Outreach (Addendum)
[facility]) Care Management  11/14/2018  Harry [name] [birth year] [id number]    Follow up call placed to [facility]  regarding patient assistance application(s) for [name] confirms patient has been approved as of 9/9 until 03/05/19. Faxed script portion of application to [facility], will follow up in 3-5 business days to check shipping status.  Outreach call made to Dr. Josephine Cables office about [place] manager for Humalog. Maudie Mercury states that Dr. Legrand Rams is going to discuss medication with patient at next office visit.   [name] Chana Bode [place] Certified Pharmacy Technician [place] Management Direct Dial:[phone]

## 2018-11-18 DIAGNOSIS — E1165 Type 2 diabetes mellitus with hyperglycemia: Secondary | ICD-10-CM | POA: Diagnosis not present

## 2018-11-18 DIAGNOSIS — Z23 Encounter for immunization: Secondary | ICD-10-CM | POA: Diagnosis not present

## 2018-11-18 DIAGNOSIS — I951 Orthostatic hypotension: Secondary | ICD-10-CM | POA: Diagnosis not present

## 2018-11-18 DIAGNOSIS — F17210 Nicotine dependence, cigarettes, uncomplicated: Secondary | ICD-10-CM | POA: Diagnosis not present

## 2018-11-18 DIAGNOSIS — I10 Essential (primary) hypertension: Secondary | ICD-10-CM | POA: Diagnosis not present

## 2018-11-18 DIAGNOSIS — E785 Hyperlipidemia, unspecified: Secondary | ICD-10-CM | POA: Diagnosis not present

## 2018-11-18 DIAGNOSIS — J410 Simple chronic bronchitis: Secondary | ICD-10-CM | POA: Diagnosis not present

## 2018-11-18 DIAGNOSIS — E1143 Type 2 diabetes mellitus with diabetic autonomic (poly)neuropathy: Secondary | ICD-10-CM | POA: Diagnosis not present

## 2018-11-19 ENCOUNTER — Other Ambulatory Visit: Payer: Self-pay | Admitting: Pharmacy Technician

## 2018-11-19 DIAGNOSIS — I69351 Hemiplegia and hemiparesis following cerebral infarction affecting right dominant side: Secondary | ICD-10-CM | POA: Diagnosis not present

## 2018-11-19 DIAGNOSIS — E1143 Type 2 diabetes mellitus with diabetic autonomic (poly)neuropathy: Secondary | ICD-10-CM | POA: Diagnosis not present

## 2018-11-19 DIAGNOSIS — E1151 Type 2 diabetes mellitus with diabetic peripheral angiopathy without gangrene: Secondary | ICD-10-CM | POA: Diagnosis not present

## 2018-11-19 DIAGNOSIS — I7090 Unspecified atherosclerosis: Secondary | ICD-10-CM | POA: Diagnosis not present

## 2018-11-19 DIAGNOSIS — I10 Essential (primary) hypertension: Secondary | ICD-10-CM | POA: Diagnosis not present

## 2018-11-19 DIAGNOSIS — I9589 Other hypotension: Secondary | ICD-10-CM | POA: Diagnosis not present

## 2018-11-19 NOTE — Patient Outreach (Signed)
[facility]) Care Management  11/19/2018  Harry [name] July 03, 1945 [id number]    Follow up call placed to [employer] ([facility] regarding patient assistance shipping details for Harry [name] states that medication is to be delivered to patients home on 9/22.   Follow up:  Will follow up with patient in 5-7 business days to confirm medication has been received.  [name] Chana Bode [place] Certified Pharmacy Technician [place] Management Direct Dial:[phone]

## 2018-11-20 DIAGNOSIS — I69351 Hemiplegia and hemiparesis following cerebral infarction affecting right dominant side: Secondary | ICD-10-CM | POA: Diagnosis not present

## 2018-11-20 DIAGNOSIS — I10 Essential (primary) hypertension: Secondary | ICD-10-CM | POA: Diagnosis not present

## 2018-11-20 DIAGNOSIS — I9589 Other hypotension: Secondary | ICD-10-CM | POA: Diagnosis not present

## 2018-11-20 DIAGNOSIS — I7090 Unspecified atherosclerosis: Secondary | ICD-10-CM | POA: Diagnosis not present

## 2018-11-20 DIAGNOSIS — E1151 Type 2 diabetes mellitus with diabetic peripheral angiopathy without gangrene: Secondary | ICD-10-CM | POA: Diagnosis not present

## 2018-11-20 DIAGNOSIS — E1143 Type 2 diabetes mellitus with diabetic autonomic (poly)neuropathy: Secondary | ICD-10-CM | POA: Diagnosis not present

## 2018-11-21 ENCOUNTER — Other Ambulatory Visit: Payer: Self-pay | Admitting: Pharmacy Technician

## 2018-11-21 DIAGNOSIS — I69351 Hemiplegia and hemiparesis following cerebral infarction affecting right dominant side: Secondary | ICD-10-CM | POA: Diagnosis not present

## 2018-11-21 DIAGNOSIS — I10 Essential (primary) hypertension: Secondary | ICD-10-CM | POA: Diagnosis not present

## 2018-11-21 DIAGNOSIS — E1151 Type 2 diabetes mellitus with diabetic peripheral angiopathy without gangrene: Secondary | ICD-10-CM | POA: Diagnosis not present

## 2018-11-21 DIAGNOSIS — I7090 Unspecified atherosclerosis: Secondary | ICD-10-CM | POA: Diagnosis not present

## 2018-11-21 DIAGNOSIS — I9589 Other hypotension: Secondary | ICD-10-CM | POA: Diagnosis not present

## 2018-11-21 DIAGNOSIS — E1143 Type 2 diabetes mellitus with diabetic autonomic (poly)neuropathy: Secondary | ICD-10-CM | POA: Diagnosis not present

## 2018-11-21 NOTE — Patient Outreach (Signed)
[place] [facility]) Care Management  11/21/2018  Harry [name] April 17, 1945 [id number]   Received provider portion of [facility] application for Humalog, howver Dr. Legrand Rams crossed out Humalog and wrote in [place].  Contacted Ebonee at office and informed her that paitnet has been approved for [employer] already and that Novolog is not a [employer] product and we were requesting the Humalog instead. She states that she will let Dr. Legrand Rams know and see what he decides.  Refaxed- application to office.  [name] Chana Bode [place] Certified Pharmacy Technician [place] Management Direct Dial:[phone]

## 2018-11-27 DIAGNOSIS — I10 Essential (primary) hypertension: Secondary | ICD-10-CM | POA: Diagnosis not present

## 2018-11-27 DIAGNOSIS — E1143 Type 2 diabetes mellitus with diabetic autonomic (poly)neuropathy: Secondary | ICD-10-CM | POA: Diagnosis not present

## 2018-11-27 DIAGNOSIS — E1151 Type 2 diabetes mellitus with diabetic peripheral angiopathy without gangrene: Secondary | ICD-10-CM | POA: Diagnosis not present

## 2018-11-27 DIAGNOSIS — I69351 Hemiplegia and hemiparesis following cerebral infarction affecting right dominant side: Secondary | ICD-10-CM | POA: Diagnosis not present

## 2018-11-27 DIAGNOSIS — I7090 Unspecified atherosclerosis: Secondary | ICD-10-CM | POA: Diagnosis not present

## 2018-11-27 DIAGNOSIS — I9589 Other hypotension: Secondary | ICD-10-CM | POA: Diagnosis not present

## 2018-12-08 ENCOUNTER — Other Ambulatory Visit: Payer: Self-pay | Admitting: Pharmacist

## 2018-12-08 ENCOUNTER — Ambulatory Visit: Payer: Self-pay | Admitting: Pharmacist

## 2018-12-08 NOTE — Patient Outreach (Addendum)
[place] [facility]) Care Management  [place]  12/08/2018  ROALD WOLLAM 28-Jan-1946 [id number]  Reason for call: f/u on Novolog vs Humalog for patient assistance program application.  Humalog application sent to provider however Novolog insulin prescription returned.  Several outreach attempts made by [place] technician to clarify however no return call from provider.    Outreach:  Unsuccessful telephone call attempt #1 to patient's daughter.   HIPAA compliant voicemail left requesting a return call   Unsuccessful telephone call to Dr. Josephine Cables office.  Message left requesting a return call.    Plan:  Will f/u with daughter again next week if no response  Ralene Bathe, PharmD, [place] [phone]   Addendum: Incoming call from daughter.  Updated daughter on situation with short-acting insulin.  Daughter will call Dr. Josephine Cables office as well and ask that RX for Humalog be sent to [facility].  Fax # for [employer] provided to daughter. Will route note back to [place] technician.    Ralene Bathe, PharmD, [place] [phone]

## 2018-12-18 ENCOUNTER — Ambulatory Visit: Payer: Self-pay | Admitting: Pharmacist

## 2018-12-18 DIAGNOSIS — J449 Chronic obstructive pulmonary disease, unspecified: Secondary | ICD-10-CM | POA: Diagnosis not present

## 2018-12-18 DIAGNOSIS — F17200 Nicotine dependence, unspecified, uncomplicated: Secondary | ICD-10-CM | POA: Diagnosis not present

## 2018-12-18 DIAGNOSIS — I951 Orthostatic hypotension: Secondary | ICD-10-CM | POA: Diagnosis not present

## 2018-12-19 ENCOUNTER — Emergency Department (HOSPITAL_COMMUNITY): Payer: Medicare Other

## 2018-12-19 ENCOUNTER — Emergency Department (HOSPITAL_COMMUNITY)
Admission: EM | Admit: 2018-12-19 | Discharge: 2018-12-19 | Disposition: A | Discharge status: Home / Self Care | Payer: Medicare Other | Attending: Emergency Medicine | Admitting: Emergency Medicine

## 2018-12-19 ENCOUNTER — Encounter (HOSPITAL_COMMUNITY): Payer: Self-pay

## 2018-12-19 DIAGNOSIS — F0390 Unspecified dementia without behavioral disturbance: Secondary | ICD-10-CM | POA: Diagnosis not present

## 2018-12-19 DIAGNOSIS — N183 Chronic kidney disease, stage 3 unspecified: Secondary | ICD-10-CM | POA: Diagnosis not present

## 2018-12-19 DIAGNOSIS — Z7982 Long term (current) use of aspirin: Secondary | ICD-10-CM | POA: Insufficient documentation

## 2018-12-19 DIAGNOSIS — R05 Cough: Secondary | ICD-10-CM | POA: Insufficient documentation

## 2018-12-19 DIAGNOSIS — M549 Dorsalgia, unspecified: Secondary | ICD-10-CM | POA: Insufficient documentation

## 2018-12-19 DIAGNOSIS — R531 Weakness: Secondary | ICD-10-CM | POA: Diagnosis not present

## 2018-12-19 DIAGNOSIS — D649 Anemia, unspecified: Secondary | ICD-10-CM | POA: Diagnosis not present

## 2018-12-19 DIAGNOSIS — M5489 Other dorsalgia: Secondary | ICD-10-CM | POA: Diagnosis not present

## 2018-12-19 DIAGNOSIS — I10 Essential (primary) hypertension: Secondary | ICD-10-CM | POA: Diagnosis not present

## 2018-12-19 DIAGNOSIS — I129 Hypertensive chronic kidney disease with stage 1 through stage 4 chronic kidney disease, or unspecified chronic kidney disease: Secondary | ICD-10-CM | POA: Insufficient documentation

## 2018-12-19 DIAGNOSIS — Z209 Contact with and (suspected) exposure to unspecified communicable disease: Secondary | ICD-10-CM | POA: Diagnosis not present

## 2018-12-19 DIAGNOSIS — R410 Disorientation, unspecified: Secondary | ICD-10-CM | POA: Insufficient documentation

## 2018-12-19 DIAGNOSIS — R0602 Shortness of breath: Secondary | ICD-10-CM | POA: Diagnosis not present

## 2018-12-19 DIAGNOSIS — Z794 Long term (current) use of insulin: Secondary | ICD-10-CM | POA: Diagnosis not present

## 2018-12-19 DIAGNOSIS — F17210 Nicotine dependence, cigarettes, uncomplicated: Secondary | ICD-10-CM | POA: Diagnosis not present

## 2018-12-19 DIAGNOSIS — Z20822 Contact with and (suspected) exposure to covid-19: Secondary | ICD-10-CM

## 2018-12-19 DIAGNOSIS — Z20828 Contact with and (suspected) exposure to other viral communicable diseases: Secondary | ICD-10-CM | POA: Insufficient documentation

## 2018-12-19 DIAGNOSIS — E1122 Type 2 diabetes mellitus with diabetic chronic kidney disease: Secondary | ICD-10-CM | POA: Insufficient documentation

## 2018-12-19 LAB — CBC WITH DIFFERENTIAL/PLATELET
Abs Immature Granulocytes: 0.01 10*3/uL (ref 0.00–0.07)
Basophils Absolute: 0 10*3/uL (ref 0.0–0.1)
Basophils Relative: 1 %
Eosinophils Absolute: 0.3 10*3/uL (ref 0.0–0.5)
Eosinophils Relative: 4 %
HCT: 26.8 % — ABNORMAL LOW (ref 39.0–52.0)
Hemoglobin: 7.9 g/dL — ABNORMAL LOW (ref 13.0–17.0)
Immature Granulocytes: 0 %
Lymphocytes Relative: 18 %
Lymphs Abs: 1.2 10*3/uL (ref 0.7–4.0)
MCH: 26.1 pg (ref 26.0–34.0)
MCHC: 29.5 g/dL — ABNORMAL LOW (ref 30.0–36.0)
MCV: 88.4 fL (ref 80.0–100.0)
Monocytes Absolute: 0.9 10*3/uL (ref 0.1–1.0)
Monocytes Relative: 14 %
Neutro Abs: 4.2 10*3/uL (ref 1.7–7.7)
Neutrophils Relative %: 63 %
Platelets: 272 10*3/uL (ref 150–400)
RBC: 3.03 MIL/uL — ABNORMAL LOW (ref 4.22–5.81)
RDW: 16.3 % — ABNORMAL HIGH (ref 11.5–15.5)
WBC: 6.6 10*3/uL (ref 4.0–10.5)
nRBC: 0 % (ref 0.0–0.2)

## 2018-12-19 LAB — COMPREHENSIVE METABOLIC PANEL
ALT: 16 U/L (ref 0–44)
AST: 23 U/L (ref 15–41)
Albumin: 3.4 g/dL — ABNORMAL LOW (ref 3.5–5.0)
Alkaline Phosphatase: 55 U/L (ref 38–126)
Anion gap: 9 (ref 5–15)
BUN: 24 mg/dL — ABNORMAL HIGH (ref 8–23)
CO2: 21 mmol/L — ABNORMAL LOW (ref 22–32)
Calcium: 8.5 mg/dL — ABNORMAL LOW (ref 8.9–10.3)
Chloride: 106 mmol/L (ref 98–111)
Creatinine, Ser: 1.78 mg/dL — ABNORMAL HIGH (ref 0.61–1.24)
GFR calc Af Amer: 43 mL/min — ABNORMAL LOW (ref >60)
GFR calc non Af Amer: 37 mL/min — ABNORMAL LOW (ref >60)
Glucose, Bld: 194 mg/dL — ABNORMAL HIGH (ref 70–99)
Potassium: 3.9 mmol/L (ref 3.5–5.1)
Sodium: 136 mmol/L (ref 135–145)
Total Bilirubin: 0.7 mg/dL (ref 0.3–1.2)
Total Protein: 7 g/dL (ref 6.5–8.1)

## 2018-12-19 LAB — SARS CORONAVIRUS 2 (TAT 6-24 HRS): SARS Coronavirus 2: NEGATIVE

## 2018-12-19 NOTE — ED Triage Notes (Signed)
Pt BIB [facility] with SOB x 2 days (worse today) and weakness x 2 weeks. Possible exposure to COVID-19 a few days ago. Hx of HTN and Dementia. VSS with EMS except BP: 190/98, CBG: 240. A&Ox4. No SOB right now and VSS.
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
## 2018-12-19 NOTE — ED Notes (Signed)
Patient verbalizes understanding of discharge instructions. Opportunity for questioning and answers were provided. Armband removed by staff, pt discharged from ED via wheelchair to home.  

## 2018-12-19 NOTE — ED Notes (Signed)
Ambulated pt in hallway with walker. Pt states he is dizzy when standing.
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
## 2018-12-22 ENCOUNTER — Other Ambulatory Visit: Payer: Self-pay | Admitting: Pharmacist

## 2018-12-22 ENCOUNTER — Telehealth: Payer: Medicare Other | Admitting: Adult Health

## 2018-12-22 NOTE — Patient Outreach (Signed)
[facility] ([facility] Care Management [facility]  12/22/2018  Harry [name] 11/07/1945 [id number]  Patient has been approved for Basaglar patient assistance program(s). Medication delivery to patient confirmed by [employer] patient assistance program representative.    No prescription for Humalog received yet by [employer] from provider office (Dr. Fanta).   Office has been notified several times regarding request for Humalog patient assistance program application and patient's daughter also provided with directions for asking office to send in Humalog application to [employer].   Nothing further can be done by [facility] at this point regarding Humalog.    Plan: [facility] pharmacy case is being closed due to the following reasons: -Goals of care have been met. -I have provided my contact information if patient or family needs to reach out to me in the future.  -Thank you for allowing [facility] pharmacy to be involved in this patient's care.     , PharmD, BCPS Clinical Pharmacist [facility] [phone]         

## 2019-01-20 ENCOUNTER — Other Ambulatory Visit: Payer: Self-pay | Admitting: Cardiovascular Disease

## 2019-02-03 DIAGNOSIS — H40011 Open angle with borderline findings, low risk, right eye: Secondary | ICD-10-CM | POA: Diagnosis not present

## 2019-03-01 ENCOUNTER — Encounter (HOSPITAL_COMMUNITY): Payer: Self-pay | Admitting: Emergency Medicine

## 2019-03-01 ENCOUNTER — Other Ambulatory Visit: Payer: Self-pay

## 2019-03-01 ENCOUNTER — Emergency Department (HOSPITAL_COMMUNITY): Payer: Medicare Other

## 2019-03-01 ENCOUNTER — Emergency Department (HOSPITAL_COMMUNITY)
Admission: EM | Admit: 2019-03-01 | Discharge: 2019-03-01 | Disposition: A | Discharge status: Home / Self Care | Payer: Medicare Other | Attending: Emergency Medicine | Admitting: Emergency Medicine

## 2019-03-01 DIAGNOSIS — E1140 Type 2 diabetes mellitus with diabetic neuropathy, unspecified: Secondary | ICD-10-CM | POA: Insufficient documentation

## 2019-03-01 DIAGNOSIS — R55 Syncope and collapse: Secondary | ICD-10-CM | POA: Insufficient documentation

## 2019-03-01 DIAGNOSIS — I129 Hypertensive chronic kidney disease with stage 1 through stage 4 chronic kidney disease, or unspecified chronic kidney disease: Secondary | ICD-10-CM | POA: Diagnosis not present

## 2019-03-01 DIAGNOSIS — N183 Chronic kidney disease, stage 3 unspecified: Secondary | ICD-10-CM | POA: Insufficient documentation

## 2019-03-01 DIAGNOSIS — E1122 Type 2 diabetes mellitus with diabetic chronic kidney disease: Secondary | ICD-10-CM | POA: Insufficient documentation

## 2019-03-01 DIAGNOSIS — Z8673 Personal history of transient ischemic attack (TIA), and cerebral infarction without residual deficits: Secondary | ICD-10-CM | POA: Diagnosis not present

## 2019-03-01 DIAGNOSIS — Z7982 Long term (current) use of aspirin: Secondary | ICD-10-CM | POA: Diagnosis not present

## 2019-03-01 DIAGNOSIS — J9811 Atelectasis: Secondary | ICD-10-CM | POA: Diagnosis not present

## 2019-03-01 DIAGNOSIS — Z794 Long term (current) use of insulin: Secondary | ICD-10-CM | POA: Diagnosis not present

## 2019-03-01 DIAGNOSIS — Z79899 Other long term (current) drug therapy: Secondary | ICD-10-CM | POA: Diagnosis not present

## 2019-03-01 DIAGNOSIS — F17210 Nicotine dependence, cigarettes, uncomplicated: Secondary | ICD-10-CM | POA: Insufficient documentation

## 2019-03-01 LAB — URINALYSIS, ROUTINE W REFLEX MICROSCOPIC
Bacteria, UA: NONE SEEN
Bilirubin Urine: NEGATIVE
Glucose, UA: 50 mg/dL — AB
Hgb urine dipstick: NEGATIVE
Ketones, ur: NEGATIVE mg/dL
Leukocytes,Ua: NEGATIVE
Nitrite: NEGATIVE
Protein, ur: 30 mg/dL — AB
Specific Gravity, Urine: 1.017 (ref 1.005–1.030)
pH: 6 (ref 5.0–8.0)

## 2019-03-01 LAB — CBC
HCT: 30.5 % — ABNORMAL LOW (ref 39.0–52.0)
Hemoglobin: 9 g/dL — ABNORMAL LOW (ref 13.0–17.0)
MCH: 24.9 pg — ABNORMAL LOW (ref 26.0–34.0)
MCHC: 29.5 g/dL — ABNORMAL LOW (ref 30.0–36.0)
MCV: 84.5 fL (ref 80.0–100.0)
Platelets: 266 10*3/uL (ref 150–400)
RBC: 3.61 MIL/uL — ABNORMAL LOW (ref 4.22–5.81)
RDW: 19.4 % — ABNORMAL HIGH (ref 11.5–15.5)
WBC: 4.5 10*3/uL (ref 4.0–10.5)
nRBC: 0 % (ref 0.0–0.2)

## 2019-03-01 LAB — BASIC METABOLIC PANEL
Anion gap: 8 (ref 5–15)
BUN: 21 mg/dL (ref 8–23)
CO2: 23 mmol/L (ref 22–32)
Calcium: 8.4 mg/dL — ABNORMAL LOW (ref 8.9–10.3)
Chloride: 106 mmol/L (ref 98–111)
Creatinine, Ser: 1.95 mg/dL — ABNORMAL HIGH (ref 0.61–1.24)
GFR calc Af Amer: 39 mL/min — ABNORMAL LOW (ref >60)
GFR calc non Af Amer: 33 mL/min — ABNORMAL LOW (ref >60)
Glucose, Bld: 177 mg/dL — ABNORMAL HIGH (ref 70–99)
Potassium: 3.8 mmol/L (ref 3.5–5.1)
Sodium: 137 mmol/L (ref 135–145)

## 2019-03-01 LAB — CBG MONITORING, ED: Glucose-Capillary: 125 mg/dL — ABNORMAL HIGH (ref 70–99)

## 2019-03-01 MED ORDER — SODIUM CHLORIDE 0.9% FLUSH: 3.0000 mL | Freq: Once | INTRAVENOUS | Status: DC

## 2019-03-01 NOTE — Progress Notes (Signed)
Pt transported to XRAY °
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
## 2019-03-01 NOTE — Discharge Instructions (Signed)
Recommend calling your primary doctor schedule follow-up appointment.  If you have any episodes of passing out, any difficulty in breathing, chest pain or other new concerning symptom please return to ER for reassessment.

## 2019-03-01 NOTE — ED Triage Notes (Signed)
Per EMS- pt had a syncopal episode at home while seated. Pt reports foul smelling frequent urination. Denies chest pain. Pt from home at baseline mentation.

## 2019-03-01 NOTE — ED Notes (Signed)
Pt verbalized understanding of discharge instructions. Follow up care reviewed. Pt's daughter was called and she is on the way to get pt. Pt brought to lobby via wheelchair.

## 2019-04-12 ENCOUNTER — Encounter (HOSPITAL_COMMUNITY): Payer: Self-pay

## 2019-04-12 ENCOUNTER — Emergency Department (HOSPITAL_COMMUNITY): Payer: Medicare Other

## 2019-04-12 ENCOUNTER — Emergency Department (HOSPITAL_COMMUNITY)
Admission: EM | Admit: 2019-04-12 | Discharge: 2019-04-12 | Disposition: A | Discharge status: Home / Self Care | Payer: Medicare Other | Attending: Emergency Medicine | Admitting: Emergency Medicine

## 2019-04-12 ENCOUNTER — Other Ambulatory Visit: Payer: Self-pay

## 2019-04-12 DIAGNOSIS — Z7982 Long term (current) use of aspirin: Secondary | ICD-10-CM | POA: Diagnosis not present

## 2019-04-12 DIAGNOSIS — R0902 Hypoxemia: Secondary | ICD-10-CM | POA: Diagnosis not present

## 2019-04-12 DIAGNOSIS — E1122 Type 2 diabetes mellitus with diabetic chronic kidney disease: Secondary | ICD-10-CM | POA: Insufficient documentation

## 2019-04-12 DIAGNOSIS — F17210 Nicotine dependence, cigarettes, uncomplicated: Secondary | ICD-10-CM | POA: Insufficient documentation

## 2019-04-12 DIAGNOSIS — E860 Dehydration: Secondary | ICD-10-CM | POA: Diagnosis not present

## 2019-04-12 DIAGNOSIS — Z794 Long term (current) use of insulin: Secondary | ICD-10-CM | POA: Insufficient documentation

## 2019-04-12 DIAGNOSIS — Z7901 Long term (current) use of anticoagulants: Secondary | ICD-10-CM | POA: Insufficient documentation

## 2019-04-12 DIAGNOSIS — R5383 Other fatigue: Secondary | ICD-10-CM | POA: Insufficient documentation

## 2019-04-12 DIAGNOSIS — R531 Weakness: Secondary | ICD-10-CM | POA: Diagnosis not present

## 2019-04-12 DIAGNOSIS — N183 Chronic kidney disease, stage 3 unspecified: Secondary | ICD-10-CM | POA: Diagnosis not present

## 2019-04-12 DIAGNOSIS — I129 Hypertensive chronic kidney disease with stage 1 through stage 4 chronic kidney disease, or unspecified chronic kidney disease: Secondary | ICD-10-CM | POA: Diagnosis not present

## 2019-04-12 DIAGNOSIS — R410 Disorientation, unspecified: Secondary | ICD-10-CM | POA: Diagnosis not present

## 2019-04-12 DIAGNOSIS — E876 Hypokalemia: Secondary | ICD-10-CM | POA: Diagnosis not present

## 2019-04-12 DIAGNOSIS — Z79899 Other long term (current) drug therapy: Secondary | ICD-10-CM | POA: Insufficient documentation

## 2019-04-12 DIAGNOSIS — I10 Essential (primary) hypertension: Secondary | ICD-10-CM | POA: Diagnosis not present

## 2019-04-12 LAB — CBC WITH DIFFERENTIAL/PLATELET
Abs Immature Granulocytes: 0.01 10*3/uL (ref 0.00–0.07)
Basophils Absolute: 0.1 10*3/uL (ref 0.0–0.1)
Basophils Relative: 1 %
Eosinophils Absolute: 0.1 10*3/uL (ref 0.0–0.5)
Eosinophils Relative: 3 %
HCT: 33.1 % — ABNORMAL LOW (ref 39.0–52.0)
Hemoglobin: 10.1 g/dL — ABNORMAL LOW (ref 13.0–17.0)
Immature Granulocytes: 0 %
Lymphocytes Relative: 34 %
Lymphs Abs: 1.4 10*3/uL (ref 0.7–4.0)
MCH: 26.6 pg (ref 26.0–34.0)
MCHC: 30.5 g/dL (ref 30.0–36.0)
MCV: 87.1 fL (ref 80.0–100.0)
Monocytes Absolute: 0.4 10*3/uL (ref 0.1–1.0)
Monocytes Relative: 10 %
Neutro Abs: 2.2 10*3/uL (ref 1.7–7.7)
Neutrophils Relative %: 52 %
Platelets: 261 10*3/uL (ref 150–400)
RBC: 3.8 MIL/uL — ABNORMAL LOW (ref 4.22–5.81)
RDW: 19.5 % — ABNORMAL HIGH (ref 11.5–15.5)
WBC: 4.3 10*3/uL (ref 4.0–10.5)
nRBC: 0 % (ref 0.0–0.2)

## 2019-04-12 LAB — BASIC METABOLIC PANEL
Anion gap: 11 (ref 5–15)
BUN: 38 mg/dL — ABNORMAL HIGH (ref 8–23)
CO2: 24 mmol/L (ref 22–32)
Calcium: 8.6 mg/dL — ABNORMAL LOW (ref 8.9–10.3)
Chloride: 104 mmol/L (ref 98–111)
Creatinine, Ser: 2.35 mg/dL — ABNORMAL HIGH (ref 0.61–1.24)
GFR calc Af Amer: 31 mL/min — ABNORMAL LOW (ref >60)
GFR calc non Af Amer: 27 mL/min — ABNORMAL LOW (ref >60)
Glucose, Bld: 89 mg/dL (ref 70–99)
Potassium: 4 mmol/L (ref 3.5–5.1)
Sodium: 139 mmol/L (ref 135–145)

## 2019-04-12 LAB — TROPONIN I (HIGH SENSITIVITY)
Troponin I (High Sensitivity): 6 ng/L (ref <18)
Troponin I (High Sensitivity): 5 ng/L (ref <18)

## 2019-04-12 LAB — URINALYSIS, ROUTINE W REFLEX MICROSCOPIC
Bacteria, UA: NONE SEEN
Bilirubin Urine: NEGATIVE
Glucose, UA: 150 mg/dL — AB
Ketones, ur: NEGATIVE mg/dL
Leukocytes,Ua: NEGATIVE
Nitrite: NEGATIVE
Protein, ur: 30 mg/dL — AB
Specific Gravity, Urine: 1.019 (ref 1.005–1.030)
pH: 5 (ref 5.0–8.0)

## 2019-04-12 MED ORDER — SODIUM CHLORIDE 0.9 % IV BOLUS
500.0000 mL | Freq: Once | INTRAVENOUS | Status: AC
  Administered 2019-04-12: 500 mL via INTRAVENOUS
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
## 2019-04-12 NOTE — ED Triage Notes (Signed)
Per EMS, Pt is coming from home. HX of dementia, usually only A&O to his self. Poor intake and output for the last month, poor mobility for the last week. Increased urinary frequency and incontinence with foul odor for the last week. HX of UTI's.

## 2019-04-12 NOTE — ED Notes (Signed)
Pt unable to sign for AVS, paperwork handed off to family picking up pt.

## 2019-04-12 NOTE — Discharge Instructions (Addendum)
You were seen in the emergency department for increased sleeping and decreased appetite.  You had blood work EKG chest x-ray and urinalysis.  The most significant finding is she looked dehydrated.  Your symptoms improved with some IV fluids and you were able to eat and drink something here.  It would be important for you to continue to try to stay well-hydrated.  Please contact your primary care doctor for close follow-up.  Return to the emergency department if any worsening symptoms.

## 2019-04-12 NOTE — ED Notes (Signed)
Pt states still not able to void. Has tried a couple of times

## 2019-04-13 LAB — URINE CULTURE: Culture: NO GROWTH

## 2019-04-26 ENCOUNTER — Ambulatory Visit: Payer: Medicare Other

## 2019-07-23 ENCOUNTER — Other Ambulatory Visit: Payer: Self-pay | Admitting: Cardiovascular Disease

## 2019-08-10 ENCOUNTER — Ambulatory Visit (HOSPITAL_COMMUNITY)
Admission: RE | Admit: 2019-08-10 | Payer: Medicare Other | Source: Ambulatory Visit | Attending: Cardiovascular Disease | Admitting: Cardiovascular Disease

## 2019-10-12 ENCOUNTER — Emergency Department (HOSPITAL_COMMUNITY)
Admission: EM | Admit: 2019-10-12 | Discharge: 2019-10-13 | Disposition: A | Discharge status: Home / Self Care | Payer: Medicare Other | Attending: Emergency Medicine | Admitting: Emergency Medicine

## 2019-10-12 ENCOUNTER — Other Ambulatory Visit: Payer: Self-pay

## 2019-10-12 ENCOUNTER — Emergency Department (HOSPITAL_COMMUNITY): Payer: Medicare Other

## 2019-10-12 ENCOUNTER — Encounter (HOSPITAL_COMMUNITY): Payer: Self-pay

## 2019-10-12 DIAGNOSIS — Z794 Long term (current) use of insulin: Secondary | ICD-10-CM | POA: Insufficient documentation

## 2019-10-12 DIAGNOSIS — Z79899 Other long term (current) drug therapy: Secondary | ICD-10-CM | POA: Diagnosis not present

## 2019-10-12 DIAGNOSIS — N390 Urinary tract infection, site not specified: Secondary | ICD-10-CM | POA: Diagnosis not present

## 2019-10-12 DIAGNOSIS — Y998 Other external cause status: Secondary | ICD-10-CM | POA: Insufficient documentation

## 2019-10-12 DIAGNOSIS — I6529 Occlusion and stenosis of unspecified carotid artery: Secondary | ICD-10-CM | POA: Diagnosis not present

## 2019-10-12 DIAGNOSIS — N289 Disorder of kidney and ureter, unspecified: Secondary | ICD-10-CM | POA: Diagnosis not present

## 2019-10-12 DIAGNOSIS — R4020 Unspecified coma: Secondary | ICD-10-CM | POA: Diagnosis not present

## 2019-10-12 DIAGNOSIS — I7090 Unspecified atherosclerosis: Secondary | ICD-10-CM | POA: Diagnosis not present

## 2019-10-12 DIAGNOSIS — F17210 Nicotine dependence, cigarettes, uncomplicated: Secondary | ICD-10-CM | POA: Insufficient documentation

## 2019-10-12 DIAGNOSIS — E1122 Type 2 diabetes mellitus with diabetic chronic kidney disease: Secondary | ICD-10-CM | POA: Insufficient documentation

## 2019-10-12 DIAGNOSIS — W19XXXA Unspecified fall, initial encounter: Secondary | ICD-10-CM | POA: Insufficient documentation

## 2019-10-12 DIAGNOSIS — F0390 Unspecified dementia without behavioral disturbance: Secondary | ICD-10-CM | POA: Diagnosis not present

## 2019-10-12 DIAGNOSIS — Y9289 Other specified places as the place of occurrence of the external cause: Secondary | ICD-10-CM | POA: Diagnosis not present

## 2019-10-12 DIAGNOSIS — Y9384 Activity, sleeping: Secondary | ICD-10-CM | POA: Diagnosis not present

## 2019-10-12 DIAGNOSIS — R55 Syncope and collapse: Secondary | ICD-10-CM | POA: Diagnosis not present

## 2019-10-12 DIAGNOSIS — G9389 Other specified disorders of brain: Secondary | ICD-10-CM | POA: Diagnosis not present

## 2019-10-12 DIAGNOSIS — I129 Hypertensive chronic kidney disease with stage 1 through stage 4 chronic kidney disease, or unspecified chronic kidney disease: Secondary | ICD-10-CM | POA: Diagnosis not present

## 2019-10-12 DIAGNOSIS — S069X9A Unspecified intracranial injury with loss of consciousness of unspecified duration, initial encounter: Secondary | ICD-10-CM | POA: Diagnosis present

## 2019-10-12 DIAGNOSIS — N183 Chronic kidney disease, stage 3 unspecified: Secondary | ICD-10-CM | POA: Insufficient documentation

## 2019-10-12 DIAGNOSIS — I10 Essential (primary) hypertension: Secondary | ICD-10-CM | POA: Diagnosis not present

## 2019-10-12 LAB — BASIC METABOLIC PANEL
Anion gap: 11 (ref 5–15)
BUN: 28 mg/dL — ABNORMAL HIGH (ref 8–23)
CO2: 24 mmol/L (ref 22–32)
Calcium: 8.8 mg/dL — ABNORMAL LOW (ref 8.9–10.3)
Chloride: 106 mmol/L (ref 98–111)
Creatinine, Ser: 2.41 mg/dL — ABNORMAL HIGH (ref 0.61–1.24)
GFR calc Af Amer: 30 mL/min — ABNORMAL LOW (ref >60)
GFR calc non Af Amer: 26 mL/min — ABNORMAL LOW (ref >60)
Glucose, Bld: 235 mg/dL — ABNORMAL HIGH (ref 70–99)
Potassium: 3.9 mmol/L (ref 3.5–5.1)
Sodium: 141 mmol/L (ref 135–145)

## 2019-10-12 LAB — CBC
HCT: 32.8 % — ABNORMAL LOW (ref 39.0–52.0)
Hemoglobin: 9.8 g/dL — ABNORMAL LOW (ref 13.0–17.0)
MCH: 26.3 pg (ref 26.0–34.0)
MCHC: 29.9 g/dL — ABNORMAL LOW (ref 30.0–36.0)
MCV: 87.9 fL (ref 80.0–100.0)
Platelets: 280 10*3/uL (ref 150–400)
RBC: 3.73 MIL/uL — ABNORMAL LOW (ref 4.22–5.81)
RDW: 19.2 % — ABNORMAL HIGH (ref 11.5–15.5)
WBC: 5.2 10*3/uL (ref 4.0–10.5)
nRBC: 0 % (ref 0.0–0.2)

## 2019-10-12 MED ORDER — SODIUM CHLORIDE 0.9% FLUSH: 3.0000 mL | Freq: Once | INTRAVENOUS | Status: DC

## 2019-10-12 NOTE — ED Triage Notes (Signed)
Pt presents to ED via EMS after syncopal episode. Pt told family he was feeling dizzy after getting up, then had syncopal episode. Family reports pt was unresponsive for ~ 20 mins. When EMS arrived pt was a&0x4. +orthostatic VS- BP sitting 108/90, BP standing 70/ palp. PT received 400cc NS pta.   Family reports multiple falls over last couple of days.   A&ox4 156/80 CBG 237  18G RAC

## 2019-10-13 DIAGNOSIS — R55 Syncope and collapse: Secondary | ICD-10-CM | POA: Diagnosis not present

## 2019-10-13 LAB — URINALYSIS, ROUTINE W REFLEX MICROSCOPIC
Bilirubin Urine: NEGATIVE
Glucose, UA: 150 mg/dL — AB
Ketones, ur: NEGATIVE mg/dL
Nitrite: NEGATIVE
Protein, ur: 30 mg/dL — AB
Specific Gravity, Urine: 1.024 (ref 1.005–1.030)
pH: 5 (ref 5.0–8.0)

## 2019-10-13 LAB — CBG MONITORING, ED: Glucose-Capillary: 166 mg/dL — ABNORMAL HIGH (ref 70–99)

## 2019-10-13 MED ORDER — CEPHALEXIN 250 MG PO CAPS
500.0000 mg | ORAL_CAPSULE | Freq: Once | ORAL | Status: AC
  Administered 2019-10-13: 500 mg via ORAL
  Filled 2019-10-13: qty 2

## 2019-10-13 MED ORDER — CEPHALEXIN 500 MG PO CAPS
500.0000 mg | ORAL_CAPSULE | Freq: Two times a day (BID) | ORAL | 0 refills | Status: AC
Start: 2019-10-13 — End: 2019-10-18

## 2019-10-13 MED ORDER — SODIUM CHLORIDE 0.9 % IV BOLUS
1000.0000 mL | Freq: Once | INTRAVENOUS | Status: AC
  Administered 2019-10-13: 1000 mL via INTRAVENOUS
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
## 2019-10-13 NOTE — Discharge Instructions (Signed)
As discussed, your evaluation today has been largely reassuring.  But, it is important that you monitor your condition carefully, and do not hesitate to return to the ED if you develop new, or concerning changes in your condition. ? ?Otherwise, please follow-up with your physician for appropriate ongoing care. ? ?

## 2019-10-13 NOTE — ED Notes (Signed)
Pt ambulated well with a walker to restroom to change into clothes.

## 2019-10-13 NOTE — ED Notes (Signed)
CBG 166 

## 2019-10-20 ENCOUNTER — Emergency Department (HOSPITAL_COMMUNITY): Payer: Medicare Other

## 2019-10-20 ENCOUNTER — Other Ambulatory Visit: Payer: Self-pay

## 2019-10-20 ENCOUNTER — Encounter (HOSPITAL_COMMUNITY): Payer: Self-pay

## 2019-10-20 ENCOUNTER — Inpatient Hospital Stay (HOSPITAL_COMMUNITY)
Admission: EM | Admit: 2019-10-20 | Discharge: 2019-10-30 | DRG: 312 | Disposition: A | Discharge status: Skilled Nursing Facility | Payer: Medicare Other | Attending: Internal Medicine | Admitting: Internal Medicine

## 2019-10-20 DIAGNOSIS — I998 Other disorder of circulatory system: Secondary | ICD-10-CM | POA: Diagnosis present

## 2019-10-20 DIAGNOSIS — R0681 Apnea, not elsewhere classified: Secondary | ICD-10-CM | POA: Diagnosis not present

## 2019-10-20 DIAGNOSIS — Z7982 Long term (current) use of aspirin: Secondary | ICD-10-CM

## 2019-10-20 DIAGNOSIS — N1832 Chronic kidney disease, stage 3b: Secondary | ICD-10-CM | POA: Diagnosis present

## 2019-10-20 DIAGNOSIS — Z7951 Long term (current) use of inhaled steroids: Secondary | ICD-10-CM

## 2019-10-20 DIAGNOSIS — I160 Hypertensive urgency: Secondary | ICD-10-CM | POA: Diagnosis present

## 2019-10-20 DIAGNOSIS — N1831 Chronic kidney disease, stage 3a: Secondary | ICD-10-CM | POA: Diagnosis present

## 2019-10-20 DIAGNOSIS — R569 Unspecified convulsions: Secondary | ICD-10-CM | POA: Diagnosis not present

## 2019-10-20 DIAGNOSIS — I739 Peripheral vascular disease, unspecified: Secondary | ICD-10-CM | POA: Diagnosis present

## 2019-10-20 DIAGNOSIS — E1151 Type 2 diabetes mellitus with diabetic peripheral angiopathy without gangrene: Secondary | ICD-10-CM | POA: Diagnosis present

## 2019-10-20 DIAGNOSIS — I7090 Unspecified atherosclerosis: Secondary | ICD-10-CM | POA: Diagnosis not present

## 2019-10-20 DIAGNOSIS — I951 Orthostatic hypotension: Secondary | ICD-10-CM | POA: Diagnosis not present

## 2019-10-20 DIAGNOSIS — E11649 Type 2 diabetes mellitus with hypoglycemia without coma: Secondary | ICD-10-CM | POA: Diagnosis present

## 2019-10-20 DIAGNOSIS — Z20822 Contact with and (suspected) exposure to covid-19: Secondary | ICD-10-CM | POA: Diagnosis present

## 2019-10-20 DIAGNOSIS — R4020 Unspecified coma: Secondary | ICD-10-CM | POA: Diagnosis not present

## 2019-10-20 DIAGNOSIS — E1122 Type 2 diabetes mellitus with diabetic chronic kidney disease: Secondary | ICD-10-CM | POA: Diagnosis present

## 2019-10-20 DIAGNOSIS — Z79899 Other long term (current) drug therapy: Secondary | ICD-10-CM

## 2019-10-20 DIAGNOSIS — F17210 Nicotine dependence, cigarettes, uncomplicated: Secondary | ICD-10-CM | POA: Diagnosis present

## 2019-10-20 DIAGNOSIS — I129 Hypertensive chronic kidney disease with stage 1 through stage 4 chronic kidney disease, or unspecified chronic kidney disease: Secondary | ICD-10-CM | POA: Diagnosis present

## 2019-10-20 DIAGNOSIS — Z7902 Long term (current) use of antithrombotics/antiplatelets: Secondary | ICD-10-CM

## 2019-10-20 DIAGNOSIS — R42 Dizziness and giddiness: Secondary | ICD-10-CM | POA: Diagnosis not present

## 2019-10-20 DIAGNOSIS — E785 Hyperlipidemia, unspecified: Secondary | ICD-10-CM | POA: Diagnosis present

## 2019-10-20 DIAGNOSIS — E1169 Type 2 diabetes mellitus with other specified complication: Secondary | ICD-10-CM

## 2019-10-20 DIAGNOSIS — F0390 Unspecified dementia without behavioral disturbance: Secondary | ICD-10-CM | POA: Diagnosis not present

## 2019-10-20 DIAGNOSIS — I10 Essential (primary) hypertension: Secondary | ICD-10-CM | POA: Diagnosis present

## 2019-10-20 DIAGNOSIS — E162 Hypoglycemia, unspecified: Secondary | ICD-10-CM | POA: Diagnosis not present

## 2019-10-20 DIAGNOSIS — R55 Syncope and collapse: Secondary | ICD-10-CM | POA: Diagnosis present

## 2019-10-20 DIAGNOSIS — I959 Hypotension, unspecified: Secondary | ICD-10-CM | POA: Diagnosis not present

## 2019-10-20 DIAGNOSIS — E161 Other hypoglycemia: Secondary | ICD-10-CM | POA: Diagnosis not present

## 2019-10-20 DIAGNOSIS — Z8673 Personal history of transient ischemic attack (TIA), and cerebral infarction without residual deficits: Secondary | ICD-10-CM

## 2019-10-20 DIAGNOSIS — Z7189 Other specified counseling: Secondary | ICD-10-CM

## 2019-10-20 DIAGNOSIS — N183 Chronic kidney disease, stage 3 unspecified: Secondary | ICD-10-CM | POA: Diagnosis present

## 2019-10-20 DIAGNOSIS — Z794 Long term (current) use of insulin: Secondary | ICD-10-CM

## 2019-10-20 DIAGNOSIS — F329 Major depressive disorder, single episode, unspecified: Secondary | ICD-10-CM | POA: Diagnosis present

## 2019-10-20 DIAGNOSIS — Z515 Encounter for palliative care: Secondary | ICD-10-CM

## 2019-10-20 DIAGNOSIS — I6782 Cerebral ischemia: Secondary | ICD-10-CM | POA: Diagnosis not present

## 2019-10-20 LAB — COMPREHENSIVE METABOLIC PANEL
ALT: 19 U/L (ref 0–44)
AST: 20 U/L (ref 15–41)
Albumin: 3.7 g/dL (ref 3.5–5.0)
Alkaline Phosphatase: 59 U/L (ref 38–126)
Anion gap: 9 (ref 5–15)
BUN: 25 mg/dL — ABNORMAL HIGH (ref 8–23)
CO2: 27 mmol/L (ref 22–32)
Calcium: 8.9 mg/dL (ref 8.9–10.3)
Chloride: 107 mmol/L (ref 98–111)
Creatinine, Ser: 1.98 mg/dL — ABNORMAL HIGH (ref 0.61–1.24)
GFR calc Af Amer: 38 mL/min — ABNORMAL LOW (ref >60)
GFR calc non Af Amer: 33 mL/min — ABNORMAL LOW (ref >60)
Glucose, Bld: 72 mg/dL (ref 70–99)
Potassium: 3.7 mmol/L (ref 3.5–5.1)
Sodium: 143 mmol/L (ref 135–145)
Total Bilirubin: 0.9 mg/dL (ref 0.3–1.2)
Total Protein: 7.4 g/dL (ref 6.5–8.1)

## 2019-10-20 LAB — CBC WITH DIFFERENTIAL/PLATELET
Abs Immature Granulocytes: 0.01 10*3/uL (ref 0.00–0.07)
Basophils Absolute: 0 10*3/uL (ref 0.0–0.1)
Basophils Relative: 1 %
Eosinophils Absolute: 0.1 10*3/uL (ref 0.0–0.5)
Eosinophils Relative: 3 %
HCT: 32.6 % — ABNORMAL LOW (ref 39.0–52.0)
Hemoglobin: 9.7 g/dL — ABNORMAL LOW (ref 13.0–17.0)
Immature Granulocytes: 0 %
Lymphocytes Relative: 27 %
Lymphs Abs: 1.5 10*3/uL (ref 0.7–4.0)
MCH: 25.9 pg — ABNORMAL LOW (ref 26.0–34.0)
MCHC: 29.8 g/dL — ABNORMAL LOW (ref 30.0–36.0)
MCV: 87.2 fL (ref 80.0–100.0)
Monocytes Absolute: 0.6 10*3/uL (ref 0.1–1.0)
Monocytes Relative: 10 %
Neutro Abs: 3.2 10*3/uL (ref 1.7–7.7)
Neutrophils Relative %: 59 %
Platelets: 256 10*3/uL (ref 150–400)
RBC: 3.74 MIL/uL — ABNORMAL LOW (ref 4.22–5.81)
RDW: 19.1 % — ABNORMAL HIGH (ref 11.5–15.5)
WBC: 5.4 10*3/uL (ref 4.0–10.5)
nRBC: 0 % (ref 0.0–0.2)

## 2019-10-20 LAB — URINALYSIS, ROUTINE W REFLEX MICROSCOPIC
Bilirubin Urine: NEGATIVE
Glucose, UA: 50 mg/dL — AB
Ketones, ur: NEGATIVE mg/dL
Leukocytes,Ua: NEGATIVE
Nitrite: NEGATIVE
Protein, ur: NEGATIVE mg/dL
Specific Gravity, Urine: 1.015 (ref 1.005–1.030)
pH: 6 (ref 5.0–8.0)

## 2019-10-20 LAB — CBG MONITORING, ED
Glucose-Capillary: 53 mg/dL — ABNORMAL LOW (ref 70–99)
Glucose-Capillary: 86 mg/dL (ref 70–99)
Glucose-Capillary: 180 mg/dL — ABNORMAL HIGH (ref 70–99)
Glucose-Capillary: 152 mg/dL — ABNORMAL HIGH (ref 70–99)

## 2019-10-20 LAB — CBC
HCT: 31.6 % — ABNORMAL LOW (ref 39.0–52.0)
Hemoglobin: 9.3 g/dL — ABNORMAL LOW (ref 13.0–17.0)
MCH: 25.6 pg — ABNORMAL LOW (ref 26.0–34.0)
MCHC: 29.4 g/dL — ABNORMAL LOW (ref 30.0–36.0)
MCV: 87.1 fL (ref 80.0–100.0)
Platelets: 248 10*3/uL (ref 150–400)
RBC: 3.63 MIL/uL — ABNORMAL LOW (ref 4.22–5.81)
RDW: 18.9 % — ABNORMAL HIGH (ref 11.5–15.5)
WBC: 5.7 10*3/uL (ref 4.0–10.5)
nRBC: 0 % (ref 0.0–0.2)

## 2019-10-20 LAB — CREATININE, SERUM
Creatinine, Ser: 1.75 mg/dL — ABNORMAL HIGH (ref 0.61–1.24)
GFR calc Af Amer: 44 mL/min — ABNORMAL LOW (ref >60)
GFR calc non Af Amer: 38 mL/min — ABNORMAL LOW (ref >60)

## 2019-10-20 LAB — TSH: TSH: 0.963 u[IU]/mL (ref 0.350–4.500)

## 2019-10-20 LAB — HEMOGLOBIN A1C
Hgb A1c MFr Bld: 7 % — ABNORMAL HIGH (ref 4.8–5.6)
Mean Plasma Glucose: 154.2 mg/dL

## 2019-10-20 LAB — SARS CORONAVIRUS 2 BY RT PCR (HOSPITAL ORDER, PERFORMED IN ~~LOC~~ HOSPITAL LAB): SARS Coronavirus 2: NEGATIVE

## 2019-10-20 LAB — TROPONIN I (HIGH SENSITIVITY)
Troponin I (High Sensitivity): 9 ng/L (ref <18)
Troponin I (High Sensitivity): 7 ng/L (ref <18)

## 2019-10-20 MED ORDER — POLYVINYL ALCOHOL 1.4 % OP SOLN
1.0000 [drp] | OPHTHALMIC | Status: DC | PRN
  Administered 2019-10-21 – 2019-10-29 (×4): 1 [drp] via OPHTHALMIC
  Filled 2019-10-20 (×3): qty 15

## 2019-10-20 MED ORDER — ACETAMINOPHEN 500 MG PO TABS: 1000.0000 mg | ORAL_TABLET | Freq: Four times a day (QID) | ORAL | Status: DC | PRN

## 2019-10-20 MED ORDER — ROSUVASTATIN CALCIUM 5 MG PO TABS
5.0000 mg | ORAL_TABLET | Freq: Every evening | ORAL | Status: DC
  Administered 2019-10-20 – 2019-10-30 (×11): 5 mg via ORAL
  Filled 2019-10-20 (×11): qty 1

## 2019-10-20 MED ORDER — LORAZEPAM 2 MG/ML IJ SOLN
1.0000 mg | Freq: Once | INTRAMUSCULAR | Status: AC
  Administered 2019-10-21: 1 mg via INTRAVENOUS
  Filled 2019-10-20: qty 1

## 2019-10-20 MED ORDER — LISINOPRIL 10 MG PO TABS
10.0000 mg | ORAL_TABLET | Freq: Once | ORAL | Status: AC
  Administered 2019-10-20: 10 mg via ORAL
  Filled 2019-10-20: qty 1

## 2019-10-20 MED ORDER — SODIUM CHLORIDE 0.9% IV SOLUTION: Freq: Once | INTRAVENOUS | Status: DC

## 2019-10-20 MED ORDER — ENOXAPARIN SODIUM 40 MG/0.4ML ~~LOC~~ SOLN
40.0000 mg | SUBCUTANEOUS | Status: DC
  Administered 2019-10-20 – 2019-10-29 (×10): 40 mg via SUBCUTANEOUS
  Filled 2019-10-20 (×10): qty 0.4

## 2019-10-20 MED ORDER — ONDANSETRON HCL 4 MG PO TABS: 4.0000 mg | ORAL_TABLET | Freq: Four times a day (QID) | ORAL | Status: DC | PRN

## 2019-10-20 MED ORDER — SERTRALINE HCL 25 MG PO TABS
25.0000 mg | ORAL_TABLET | Freq: Every day | ORAL | Status: DC
  Administered 2019-10-20 – 2019-10-23 (×4): 25 mg via ORAL
  Filled 2019-10-20 (×4): qty 1

## 2019-10-20 MED ORDER — INSULIN ASPART 100 UNIT/ML ~~LOC~~ SOLN
0.0000 [IU] | Freq: Three times a day (TID) | SUBCUTANEOUS | Status: DC
  Administered 2019-10-21 – 2019-10-22 (×3): 2 [IU] via SUBCUTANEOUS
  Administered 2019-10-23: 1 [IU] via SUBCUTANEOUS
  Administered 2019-10-23 – 2019-10-24 (×2): 2 [IU] via SUBCUTANEOUS
  Administered 2019-10-24: 3 [IU] via SUBCUTANEOUS
  Administered 2019-10-25 – 2019-10-26 (×2): 1 [IU] via SUBCUTANEOUS
  Administered 2019-10-27 – 2019-10-28 (×3): 2 [IU] via SUBCUTANEOUS
  Administered 2019-10-28: 3 [IU] via SUBCUTANEOUS
  Administered 2019-10-28: 2 [IU] via SUBCUTANEOUS
  Administered 2019-10-29: 1 [IU] via SUBCUTANEOUS
  Administered 2019-10-29 – 2019-10-30 (×4): 2 [IU] via SUBCUTANEOUS

## 2019-10-20 MED ORDER — SODIUM CHLORIDE 0.9% FLUSH
3.0000 mL | Freq: Two times a day (BID) | INTRAVENOUS | Status: DC
  Administered 2019-10-20 – 2019-10-30 (×19): 3 mL via INTRAVENOUS

## 2019-10-20 MED ORDER — ONDANSETRON HCL 4 MG/2ML IJ SOLN: 4.0000 mg | Freq: Four times a day (QID) | INTRAMUSCULAR | Status: DC | PRN

## 2019-10-20 MED ORDER — INSULIN ASPART 100 UNIT/ML ~~LOC~~ SOLN
0.0000 [IU] | Freq: Every day | SUBCUTANEOUS | Status: DC
  Administered 2019-10-24: 3 [IU] via SUBCUTANEOUS
  Administered 2019-10-25: 2 [IU] via SUBCUTANEOUS
  Administered 2019-10-27 – 2019-10-29 (×3): 3 [IU] via SUBCUTANEOUS

## 2019-10-20 MED ORDER — SODIUM CHLORIDE 0.9 % IV BOLUS
1000.0000 mL | Freq: Once | INTRAVENOUS | Status: AC
  Administered 2019-10-20: 1000 mL via INTRAVENOUS

## 2019-10-20 MED ORDER — CLOPIDOGREL BISULFATE 75 MG PO TABS
75.0000 mg | ORAL_TABLET | Freq: Every day | ORAL | Status: DC
  Administered 2019-10-20 – 2019-10-30 (×11): 75 mg via ORAL
  Filled 2019-10-20 (×11): qty 1

## 2019-10-20 MED ORDER — ASPIRIN EC 81 MG PO TBEC
81.0000 mg | DELAYED_RELEASE_TABLET | Freq: Every evening | ORAL | Status: DC
  Administered 2019-10-20 – 2019-10-30 (×11): 81 mg via ORAL
  Filled 2019-10-20 (×11): qty 1

## 2019-10-20 MED ORDER — INSULIN GLARGINE 100 UNIT/ML ~~LOC~~ SOLN
30.0000 [IU] | Freq: Every day | SUBCUTANEOUS | Status: DC
  Administered 2019-10-20: 30 [IU] via SUBCUTANEOUS
  Filled 2019-10-20 (×2): qty 0.3
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
## 2019-10-20 NOTE — ED Notes (Signed)
Condom cath placed on pt and pt is aware of urine sample needed.
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
## 2019-10-20 NOTE — ED Notes (Addendum)
PT b/p with sustained systolic > 601. MD Jonelle Sidle paged. Per MD Jonelle Sidle, will order antihypertensives after pt goes and returns to MRI.

## 2019-10-20 NOTE — ED Notes (Addendum)
PT given  [place] sandwhich and 120 ml coke for CBG 53. MD Yao notified. CBG to be rechecked.

## 2019-10-20 NOTE — Social Work (Signed)
CSW met with Pt at bedside. Pt is A&Ox4, pleasant and calm.  CSW and Pt discussed possibility of SNF rehab once Pt is medically cleared. Pt stated he "would consider it."  He did report a goal of getting stronger.  In anticipation of possible need for SNF, CSW conducted [facility] initial assessment, obtained a PASRR # and began an FL2. (PASRR# and obtained FL2 information is recorded  in documentation)  More information will be needed to complete FL2 once PT eval has been finished.

## 2019-10-21 ENCOUNTER — Observation Stay (HOSPITAL_COMMUNITY): Payer: Medicare Other

## 2019-10-21 DIAGNOSIS — R9082 White matter disease, unspecified: Secondary | ICD-10-CM | POA: Diagnosis not present

## 2019-10-21 DIAGNOSIS — R296 Repeated falls: Secondary | ICD-10-CM | POA: Diagnosis not present

## 2019-10-21 DIAGNOSIS — I619 Nontraumatic intracerebral hemorrhage, unspecified: Secondary | ICD-10-CM | POA: Diagnosis not present

## 2019-10-21 DIAGNOSIS — G9009 Other idiopathic peripheral autonomic neuropathy: Secondary | ICD-10-CM | POA: Diagnosis not present

## 2019-10-21 DIAGNOSIS — I469 Cardiac arrest, cause unspecified: Secondary | ICD-10-CM | POA: Diagnosis not present

## 2019-10-21 DIAGNOSIS — I674 Hypertensive encephalopathy: Secondary | ICD-10-CM | POA: Diagnosis not present

## 2019-10-21 DIAGNOSIS — G319 Degenerative disease of nervous system, unspecified: Secondary | ICD-10-CM | POA: Diagnosis not present

## 2019-10-21 DIAGNOSIS — M6281 Muscle weakness (generalized): Secondary | ICD-10-CM | POA: Diagnosis not present

## 2019-10-21 DIAGNOSIS — I6782 Cerebral ischemia: Secondary | ICD-10-CM | POA: Diagnosis not present

## 2019-10-21 DIAGNOSIS — Z794 Long term (current) use of insulin: Secondary | ICD-10-CM | POA: Diagnosis not present

## 2019-10-21 DIAGNOSIS — R569 Unspecified convulsions: Secondary | ICD-10-CM | POA: Diagnosis not present

## 2019-10-21 DIAGNOSIS — F05 Delirium due to known physiological condition: Secondary | ICD-10-CM | POA: Diagnosis not present

## 2019-10-21 DIAGNOSIS — N1831 Chronic kidney disease, stage 3a: Secondary | ICD-10-CM | POA: Diagnosis not present

## 2019-10-21 DIAGNOSIS — Z515 Encounter for palliative care: Secondary | ICD-10-CM | POA: Diagnosis not present

## 2019-10-21 DIAGNOSIS — F332 Major depressive disorder, recurrent severe without psychotic features: Secondary | ICD-10-CM | POA: Diagnosis not present

## 2019-10-21 DIAGNOSIS — I639 Cerebral infarction, unspecified: Secondary | ICD-10-CM | POA: Diagnosis not present

## 2019-10-21 DIAGNOSIS — I10 Essential (primary) hypertension: Secondary | ICD-10-CM | POA: Diagnosis not present

## 2019-10-21 DIAGNOSIS — Z20822 Contact with and (suspected) exposure to covid-19: Secondary | ICD-10-CM | POA: Diagnosis present

## 2019-10-21 DIAGNOSIS — Z7189 Other specified counseling: Secondary | ICD-10-CM | POA: Diagnosis not present

## 2019-10-21 DIAGNOSIS — I951 Orthostatic hypotension: Secondary | ICD-10-CM | POA: Diagnosis present

## 2019-10-21 DIAGNOSIS — R441 Visual hallucinations: Secondary | ICD-10-CM | POA: Diagnosis not present

## 2019-10-21 DIAGNOSIS — I618 Other nontraumatic intracerebral hemorrhage: Secondary | ICD-10-CM | POA: Diagnosis not present

## 2019-10-21 DIAGNOSIS — N183 Chronic kidney disease, stage 3 unspecified: Secondary | ICD-10-CM | POA: Diagnosis not present

## 2019-10-21 DIAGNOSIS — Z79899 Other long term (current) drug therapy: Secondary | ICD-10-CM | POA: Diagnosis not present

## 2019-10-21 DIAGNOSIS — Z7902 Long term (current) use of antithrombotics/antiplatelets: Secondary | ICD-10-CM | POA: Diagnosis not present

## 2019-10-21 DIAGNOSIS — Z7401 Bed confinement status: Secondary | ICD-10-CM | POA: Diagnosis not present

## 2019-10-21 DIAGNOSIS — R4182 Altered mental status, unspecified: Secondary | ICD-10-CM | POA: Diagnosis not present

## 2019-10-21 DIAGNOSIS — E11649 Type 2 diabetes mellitus with hypoglycemia without coma: Secondary | ICD-10-CM | POA: Diagnosis present

## 2019-10-21 DIAGNOSIS — Z7951 Long term (current) use of inhaled steroids: Secondary | ICD-10-CM | POA: Diagnosis not present

## 2019-10-21 DIAGNOSIS — F0390 Unspecified dementia without behavioral disturbance: Secondary | ICD-10-CM | POA: Diagnosis not present

## 2019-10-21 DIAGNOSIS — R42 Dizziness and giddiness: Secondary | ICD-10-CM | POA: Diagnosis not present

## 2019-10-21 DIAGNOSIS — Z8673 Personal history of transient ischemic attack (TIA), and cerebral infarction without residual deficits: Secondary | ICD-10-CM | POA: Diagnosis not present

## 2019-10-21 DIAGNOSIS — I160 Hypertensive urgency: Secondary | ICD-10-CM | POA: Diagnosis present

## 2019-10-21 DIAGNOSIS — F329 Major depressive disorder, single episode, unspecified: Secondary | ICD-10-CM | POA: Diagnosis present

## 2019-10-21 DIAGNOSIS — I998 Other disorder of circulatory system: Secondary | ICD-10-CM | POA: Diagnosis present

## 2019-10-21 DIAGNOSIS — M2550 Pain in unspecified joint: Secondary | ICD-10-CM | POA: Diagnosis not present

## 2019-10-21 DIAGNOSIS — R55 Syncope and collapse: Secondary | ICD-10-CM | POA: Diagnosis not present

## 2019-10-21 DIAGNOSIS — E1122 Type 2 diabetes mellitus with diabetic chronic kidney disease: Secondary | ICD-10-CM | POA: Diagnosis present

## 2019-10-21 DIAGNOSIS — F17210 Nicotine dependence, cigarettes, uncomplicated: Secondary | ICD-10-CM | POA: Diagnosis present

## 2019-10-21 DIAGNOSIS — E1151 Type 2 diabetes mellitus with diabetic peripheral angiopathy without gangrene: Secondary | ICD-10-CM | POA: Diagnosis present

## 2019-10-21 DIAGNOSIS — Z7982 Long term (current) use of aspirin: Secondary | ICD-10-CM | POA: Diagnosis not present

## 2019-10-21 DIAGNOSIS — I129 Hypertensive chronic kidney disease with stage 1 through stage 4 chronic kidney disease, or unspecified chronic kidney disease: Secondary | ICD-10-CM | POA: Diagnosis present

## 2019-10-21 DIAGNOSIS — I739 Peripheral vascular disease, unspecified: Secondary | ICD-10-CM | POA: Diagnosis not present

## 2019-10-21 DIAGNOSIS — I6389 Other cerebral infarction: Secondary | ICD-10-CM | POA: Diagnosis not present

## 2019-10-21 DIAGNOSIS — E785 Hyperlipidemia, unspecified: Secondary | ICD-10-CM | POA: Diagnosis present

## 2019-10-21 LAB — GLUCOSE, CAPILLARY
Glucose-Capillary: 164 mg/dL — ABNORMAL HIGH (ref 70–99)
Glucose-Capillary: 116 mg/dL — ABNORMAL HIGH (ref 70–99)
Glucose-Capillary: 94 mg/dL (ref 70–99)

## 2019-10-21 LAB — CBC
HCT: 29.2 % — ABNORMAL LOW (ref 39.0–52.0)
Hemoglobin: 8.9 g/dL — ABNORMAL LOW (ref 13.0–17.0)
MCH: 26.6 pg (ref 26.0–34.0)
MCHC: 30.5 g/dL (ref 30.0–36.0)
MCV: 87.2 fL (ref 80.0–100.0)
Platelets: 227 10*3/uL (ref 150–400)
RBC: 3.35 MIL/uL — ABNORMAL LOW (ref 4.22–5.81)
RDW: 19 % — ABNORMAL HIGH (ref 11.5–15.5)
WBC: 6.2 10*3/uL (ref 4.0–10.5)
nRBC: 0 % (ref 0.0–0.2)

## 2019-10-21 LAB — COMPREHENSIVE METABOLIC PANEL
ALT: 16 U/L (ref 0–44)
AST: 17 U/L (ref 15–41)
Albumin: 3 g/dL — ABNORMAL LOW (ref 3.5–5.0)
Alkaline Phosphatase: 49 U/L (ref 38–126)
Anion gap: 9 (ref 5–15)
BUN: 24 mg/dL — ABNORMAL HIGH (ref 8–23)
CO2: 24 mmol/L (ref 22–32)
Calcium: 8.4 mg/dL — ABNORMAL LOW (ref 8.9–10.3)
Chloride: 106 mmol/L (ref 98–111)
Creatinine, Ser: 1.66 mg/dL — ABNORMAL HIGH (ref 0.61–1.24)
GFR calc Af Amer: 47 mL/min — ABNORMAL LOW (ref >60)
GFR calc non Af Amer: 41 mL/min — ABNORMAL LOW (ref >60)
Glucose, Bld: 139 mg/dL — ABNORMAL HIGH (ref 70–99)
Potassium: 3.3 mmol/L — ABNORMAL LOW (ref 3.5–5.1)
Sodium: 139 mmol/L (ref 135–145)
Total Bilirubin: 0.8 mg/dL (ref 0.3–1.2)
Total Protein: 6.4 g/dL — ABNORMAL LOW (ref 6.5–8.1)

## 2019-10-21 LAB — ECHOCARDIOGRAM COMPLETE
Area-P 1/2: 3.85 cm2
S' Lateral: 2.8 cm

## 2019-10-21 LAB — CBG MONITORING, ED
Glucose-Capillary: 131 mg/dL — ABNORMAL HIGH (ref 70–99)
Glucose-Capillary: 94 mg/dL (ref 70–99)
Glucose-Capillary: 72 mg/dL (ref 70–99)

## 2019-10-21 MED ORDER — INSULIN GLARGINE 100 UNIT/ML ~~LOC~~ SOLN
10.0000 [IU] | Freq: Every day | SUBCUTANEOUS | Status: DC
  Administered 2019-10-21: 10 [IU] via SUBCUTANEOUS
  Filled 2019-10-21 (×2): qty 0.1

## 2019-10-21 MED ORDER — HYDRALAZINE HCL 25 MG PO TABS
25.0000 mg | ORAL_TABLET | Freq: Four times a day (QID) | ORAL | Status: DC | PRN
  Administered 2019-10-21: 25 mg via ORAL
  Filled 2019-10-21: qty 1

## 2019-10-21 MED ORDER — AMLODIPINE BESYLATE 5 MG PO TABS
5.0000 mg | ORAL_TABLET | Freq: Every day | ORAL | Status: DC
  Administered 2019-10-21: 5 mg via ORAL
  Filled 2019-10-21: qty 1

## 2019-10-21 MED ORDER — ENSURE ENLIVE PO LIQD
237.0000 mL | Freq: Two times a day (BID) | ORAL | Status: DC
  Administered 2019-10-22 – 2019-10-30 (×14): 237 mL via ORAL

## 2019-10-21 MED ORDER — POTASSIUM CHLORIDE CRYS ER 20 MEQ PO TBCR
30.0000 meq | EXTENDED_RELEASE_TABLET | ORAL | Status: AC
  Administered 2019-10-21 (×2): 30 meq via ORAL
  Filled 2019-10-21: qty 2
  Filled 2019-10-21: qty 1

## 2019-10-21 MED ORDER — AMLODIPINE BESYLATE 10 MG PO TABS
10.0000 mg | ORAL_TABLET | Freq: Every day | ORAL | Status: DC
  Administered 2019-10-22 – 2019-10-25 (×4): 10 mg via ORAL
  Filled 2019-10-21 (×4): qty 1

## 2019-10-21 NOTE — ED Notes (Signed)
Breakfast Ordered 

## 2019-10-21 NOTE — Plan of Care (Signed)

## 2019-10-21 NOTE — Progress Notes (Signed)
  Echocardiogram 2D Echocardiogram has been performed.  Harry [name] 10/21/2019, 9:39 AM

## 2019-10-21 NOTE — ED Notes (Signed)
MD Jonelle Sidle paged to be notified of pt return from MRI and systolic B/P sustained above 200's.

## 2019-10-21 NOTE — ED Notes (Signed)
Daughter requests that Education officer, museum and attending MD call her with undated information; [name] [phone]

## 2019-10-21 NOTE — ED Notes (Signed)
Off floor to [employer]

## 2019-10-21 NOTE — Progress Notes (Addendum)
PROGRESS NOTE    Harry [name]  FIE:[id number] DOB: 27-Nov-1945 DOA: 10/20/2019 PCP: [name], MD    Brief Narrative:  Harry [name] is a 74 y.o. male with medical history significant of chronic kidney disease stage III, hypertension, hyperlipidemia, peripheral vascular disease and peripheral neuropathy with previous syncopal episode, CVA and type 2 diabetes poorly controlled who presented to the ER with recurrent syncopal episodes.  He was seen apparently in in the ER event last week.  He passed out at that time seen in the ER and thought it was due to dehydration.  Patient was hydrated and sent home.  Since then he is passed out at least twice.  Per patient in both the time his blood pressure was low and sometimes his sugar would be low.  He is currently off antihypertensives because of hypotension.  His daughter that lives with him witnessed his passing out and noted that he was altered mentally.  He was slow down on the ground was still unresponsive until EMS got there.  They gave him assisted ventilation for about 50 minutes then brought him to the ER.  He is currently fully back to his baseline blood sugar was normal systolic blood pressure up to 180 and currently above 200 in the ER.  He has no recollection of what really happened to him.  Patient has had previous CVA.  He is being admitted with recurrent syncopal episodes..  In the ED, Temperature is 97.9 blood pressure 245/220 pulse 108 respirate of 22 oxygen sats 99% on room air.  Chemistry showed BUN 25 creatinine 1.98.  White count 5.4 hemoglobin 9.7 platelet 256.  COVID-19 negative urinalysis essentially negative A1c of 7.0 TSH 0.963.  Chest x-ray and head CT without contrast were both negative.  Patient being admitted with recurrent syncopal episode for work-up.   Assessment & Plan:   Principal Problem:   Syncope and collapse Active Problems:   Type 2 diabetes mellitus with stage 3 chronic kidney disease, with long-term  current use of insulin (HCC)   Essential hypertension   PAD (peripheral artery disease) (HCC)   Hyperlipidemia   CKD (chronic kidney disease), stage III   Dementia without behavioral disturbance (HCC)  Recurrent syncope The ED with recurrent syncopal episode.  Extremely poorly controlled hypertension, not on any medication regimen at home.  On presentation his systolic blood pressure ranging 180-250.  Daughter denied any seizure-like activity at home.  Patient is afebrile without leukocytosis.  TSH within normal limits.  CT head without acute intracranial findings, but notes multiple chronic microhemorrhages consistent with hypertensive angiopathy.  Chest x-ray no acute cardiopulmonary disease process. --Continue to monitor on telemetry --TTE: Pending --Orthostatic vital signs  --Supportive care  Hypertensive urgency Patient's BP severely elevated on admission with systolics 166-063.  Patient reportedly off antihypertensives for "hypotension". --Start amlodipine 10 mg p.o. daily --Hydralazine 25 mg p.o. every 6 hours as needed for SBP greater than 170 --Continue monitor blood pressure closely  Type 2 diabetes mellitus Goal A1c 7.0, well controlled.  On Lantus 30 units subcutaneously nightly. --We will decrease Lantus to 10 units subcutaneously nightly while inpatient --Insulin/scale for further coverage --CBGs before every meal/at bedtime  Peripheral artery disease --Continue Plavix 75 mg p.o. daily and aspirin 81 mg p.o. daily  Hyperlipidemia:  Crestor 5 mg p.o. nightly  Depression: Continue sertraline 25 mg p.o. daily   DVT prophylaxis: Lovenox Code Status: Full code Family Communication: None present at bedside, updated daughter [name] via telephone this evening  Disposition Plan:  Status is: Inpatient  Remains inpatient appropriate because:Altered mental status, Ongoing diagnostic testing needed not appropriate for outpatient work up, Unsafe d/c plan and IV treatments  appropriate due to intensity of illness or inability to take PO   Dispo: The patient is from: Home              Anticipated d/c is to: Home              Anticipated d/c date is: 2 days              Patient currently is not medically stable to d/c.  Consultants:   None  Procedures:   TTE  Antimicrobials:   None   Subjective: Patient seen and examined bedside, resting comfortably.  Awaiting to be transported to echocardiogram this morning.  Continues with poorly controlled hypertension.  Requesting eyedrops.  No other complaints or concerns at this time.  Denies headache, no visual changes, no chest pain, palpitations, no shortness of breath, no abdominal pain.  No acute events overnight per nursing staff.  Objective: Vitals:   10/21/19 1100 10/21/19 1115 10/21/19 1337 10/21/19 1527  BP: (!) 182/110 (!) 200/105 (!) 194/98 (!) 138/92  Pulse: 80 81 88 87  Resp: 17 13 18 20   Temp:   98 F (36.7 C) 98.8 F (37.1 C)  TempSrc:   Oral Oral  SpO2: 98% 100% 95% 100%    Intake/Output Summary (Last 24 hours) at 10/21/2019 1717 Last data filed at 10/20/2019 2342 Gross per 24 hour  Intake 1003 ml  Output 700 ml  Net 303 ml   There were no vitals filed for this visit.  Examination:  General exam: Appears calm and comfortable, elderly in appearance Respiratory system: Clear to auscultation. Respiratory effort normal. Cardiovascular system: S1 & S2 heard, RRR. No JVD, murmurs, rubs, gallops or clicks. No pedal edema. Gastrointestinal system: Abdomen is nondistended, soft and nontender. No organomegaly or masses felt. Normal bowel sounds heard. Central nervous system: Alert and oriented. No focal neurological deficits. Extremities: Symmetric 5 x 5 power. Skin: No rashes, lesions or ulcers Psychiatry: Judgement and insight appear poor. Mood & affect appropriate.     Data Reviewed: I have personally reviewed following labs and imaging studies  CBC: Recent Labs  Lab  10/20/19 1527 10/20/19 2047 10/21/19 0415  WBC 5.4 5.7 6.2  NEUTROABS 3.2  --   --   HGB 9.7* 9.3* 8.9*  HCT 32.6* 31.6* 29.2*  MCV 87.2 87.1 87.2  PLT 256 248 161   Basic Metabolic Panel: Recent Labs  Lab 10/20/19 1527 10/20/19 2047 10/21/19 0415  NA 143  --  139  K 3.7  --  3.3*  CL 107  --  106  CO2 27  --  24  GLUCOSE 72  --  139*  BUN 25*  --  24*  CREATININE 1.98* 1.75* 1.66*  CALCIUM 8.9  --  8.4*   GFR: Estimated Creatinine Clearance: 44.1 mL/min (A) (by C-G formula based on SCr of 1.66 mg/dL (H)). Liver Function Tests: Recent Labs  Lab 10/20/19 1527 10/21/19 0415  AST 20 17  ALT 19 16  ALKPHOS 59 49  BILITOT 0.9 0.8  PROT 7.4 6.4*  ALBUMIN 3.7 3.0*   No results for input(s): LIPASE, AMYLASE in the last 168 hours. No results for input(s): AMMONIA in the last 168 hours. Coagulation Profile: No results for input(s): INR, PROTIME in the last 168 hours. Cardiac Enzymes: No results for input(s): CKTOTAL,  CKMB, CKMBINDEX, TROPONINI in the last 168 hours. BNP (last 3 results) No results for input(s): PROBNP in the last 8760 hours. HbA1C: Recent Labs    10/20/19 2047  HGBA1C 7.0*   CBG: Recent Labs  Lab 10/20/19 1835 10/20/19 2215 10/21/19 0600 10/21/19 0734 10/21/19 1333  GLUCAP 180* 152* 131* 94 72   Lipid Profile: No results for input(s): CHOL, HDL, LDLCALC, TRIG, CHOLHDL, LDLDIRECT in the last 72 hours. Thyroid Function Tests: Recent Labs    10/20/19 2047  TSH 0.963   Anemia Panel: No results for input(s): VITAMINB12, FOLATE, FERRITIN, TIBC, IRON, RETICCTPCT in the last 72 hours. Sepsis Labs: No results for input(s): PROCALCITON, LATICACIDVEN in the last 168 hours.  Recent Results (from the past 240 hour(s))  SARS Coronavirus 2 by RT PCR (hospital order, performed in [facility] hospital lab) Nasopharyngeal Nasopharyngeal Swab     Status: None   Collection Time: 10/20/19  4:17 PM   Specimen: Nasopharyngeal Swab  Result Value Ref  Range Status   SARS Coronavirus 2 NEGATIVE NEGATIVE Final    Comment: (NOTE) SARS-CoV-2 target nucleic acids are NOT DETECTED.  The SARS-CoV-2 RNA is generally detectable in upper and lower respiratory specimens during the acute phase of infection. The lowest concentration of SARS-CoV-2 viral copies this assay can detect is 250 copies / mL. A negative result does not preclude SARS-CoV-2 infection and should not be used as the sole basis for treatment or other patient management decisions.  A negative result may occur with improper specimen collection / handling, submission of specimen other than nasopharyngeal swab, presence of viral mutation(s) within the areas targeted by this assay, and inadequate number of viral copies (<250 copies / mL). A negative result must be combined with clinical observations, patient history, and epidemiological information.  Fact Sheet for Patients:   StrictlyIdeas.no  Fact Sheet for Healthcare Providers: BankingDealers.co.za  This test is not yet approved or  cleared by the [place] FDA and has been authorized for detection and/or diagnosis of SARS-CoV-2 by FDA under an Emergency Use Authorization (EUA).  This EUA will remain in effect (meaning this test can be used) for the duration of the COVID-19 declaration under Section 564(b)(1) of the Act, 21 U.S.C. section 360bbb-3(b)(1), unless the authorization is terminated or revoked sooner.  Performed at [facility], [place] [address], [place], [place] [address]          Radiology Studies: CT Head Wo Contrast  Result Date: 10/20/2019 CLINICAL DATA:  Dizziness, loss of consciousness EXAM: CT HEAD WITHOUT CONTRAST TECHNIQUE: Contiguous axial images were obtained from the base of the skull through the vertex without intravenous contrast. COMPARISON:  10/12/2019 FINDINGS: Brain: Stable areas of encephalomalacia are seen within the cerebellum, left  occipital lobe, bilateral parietal lobes consistent with prior ischemic change. Chronic small vessel ischemic changes are again noted throughout the periventricular white matter, bilateral basal ganglia, bilateral thalami. No evidence of acute infarct or hemorrhage. Lateral ventricles and remaining midline structures are unremarkable. No acute extra-axial fluid collections. No mass effect. Vascular: Stable atherosclerosis.  No hyperdense vessel. Skull: Normal. Negative for fracture or focal lesion. Sinuses/Orbits: No acute finding. Other: None. IMPRESSION: 1. Stable chronic ischemic changes.  No acute intracranial process. Electronically Signed   By: Randa Ngo M.D.   On: 10/20/2019 16:03   MR BRAIN WO CONTRAST  Result Date: 10/21/2019 CLINICAL DATA:  Recurrent syncope EXAM: MRI HEAD WITHOUT CONTRAST TECHNIQUE: Multiplanar, multiecho pulse sequences of the brain and surrounding structures were  obtained without intravenous contrast. COMPARISON:  08/12/2018 FINDINGS: Brain: No acute infarct, acute hemorrhage or extra-axial collection. There are multiple old infarcts, including both occipital lobes in both cerebellar hemispheres. There is multifocal periventricular white matter hyperintensity, most often a result of chronic microvascular ischemia. There is generalized atrophy without lobar predilection. Multiple chronic microhemorrhages in a predominantly central distribution. Normal midline structures. Vascular: Normal flow voids. Skull and upper cervical spine: Normal marrow signal. Sinuses/Orbits: Negative. Other: None. IMPRESSION: 1. No acute intracranial abnormality. 2. Multiple old infarcts, including both occipital lobes and both cerebellar hemispheres. 3. Multiple chronic microhemorrhages in a predominantly central distribution, most consistent with hypertensive angiopathy. Electronically Signed   By: Ulyses Jarred M.D.   On: 10/21/2019 01:32   DG Chest Port 1 View  Result Date: 10/20/2019 CLINICAL  DATA:  Altered level of consciousness, hypotension EXAM: PORTABLE CHEST 1 VIEW COMPARISON:  04/12/2019 FINDINGS: The heart size and mediastinal contours are within normal limits. Both lungs are clear. The visualized skeletal structures are unremarkable. IMPRESSION: No active disease. Electronically Signed   By: Randa Ngo M.D.   On: 10/20/2019 15:33   ECHOCARDIOGRAM COMPLETE  Result Date: 10/21/2019    ECHOCARDIOGRAM REPORT   Patient Name:   CLETUS PARIS Date of Exam: 10/21/2019 Medical Rec #:  [id number]        Height:       72.0 in Accession #:    [id number]       Weight:       175.0 lb Date of Birth:  March 31, 1945         BSA:          2.013 m Patient Age:    35 years         BP:           161/83 mmHg Patient Gender: M                HR:           80 bpm. Exam Location:  Inpatient Procedure: 2D Echo, Cardiac Doppler and Color Doppler Indications:    Syncope  History:        Patient has prior history of Echocardiogram examinations, most                 recent 08/12/2018. Stroke, Signs/Symptoms:Altered Mental Status                 and Syncope; Risk Factors:Hypertension, Diabetes, Dyslipidemia                 and Former Smoker. CKD.  Sonographer:    Dustin Flock Referring Phys: [place]  1. Left ventricular ejection fraction, by estimation, is 60 to 65%. The left ventricle has normal function. The left ventricle has no regional wall motion abnormalities. There is mild left ventricular hypertrophy. Left ventricular diastolic parameters are consistent with Grade I diastolic dysfunction (impaired relaxation).  2. Right ventricular systolic function is normal. The right ventricular size is normal.  3. Left atrial size was mildly dilated.  4. The mitral valve is normal in structure. Trivial mitral valve regurgitation. No evidence of mitral stenosis.  5. Calcified non coronary cusp . The aortic valve is normal in structure. Aortic valve regurgitation is not visualized. Mild to moderate  aortic valve sclerosis/calcification is present, without any evidence of aortic stenosis.  6. The inferior vena cava is normal in size with greater than 50% respiratory variability, suggesting right atrial pressure of 3 mmHg.  FINDINGS  Left Ventricle: Left ventricular ejection fraction, by estimation, is 60 to 65%. The left ventricle has normal function. The left ventricle has no regional wall motion abnormalities. The left ventricular internal cavity size was normal in size. There is  mild left ventricular hypertrophy. Left ventricular diastolic parameters are consistent with Grade I diastolic dysfunction (impaired relaxation). Right Ventricle: The right ventricular size is normal. No increase in right ventricular wall thickness. Right ventricular systolic function is normal. Left Atrium: Left atrial size was mildly dilated. Right Atrium: Right atrial size was normal in size. Pericardium: There is no evidence of pericardial effusion. Mitral Valve: The mitral valve is normal in structure. Normal mobility of the mitral valve leaflets. Trivial mitral valve regurgitation. No evidence of mitral valve stenosis. Tricuspid Valve: The tricuspid valve is normal in structure. Tricuspid valve regurgitation is not demonstrated. No evidence of tricuspid stenosis. Aortic Valve: Calcified non coronary cusp. The aortic valve is normal in structure. Aortic valve regurgitation is not visualized. Mild to moderate aortic valve sclerosis/calcification is present, without any evidence of aortic stenosis. Pulmonic Valve: The pulmonic valve was normal in structure. Pulmonic valve regurgitation is not visualized. No evidence of pulmonic stenosis. Aorta: The aortic root is normal in size and structure. Venous: The inferior vena cava is normal in size with greater than 50% respiratory variability, suggesting right atrial pressure of 3 mmHg. IAS/Shunts: No atrial level shunt detected by color flow Doppler.  LEFT VENTRICLE PLAX 2D LVIDd:          4.50 cm  Diastology LVIDs:         2.80 cm  LV e' lateral:   4.90 cm/s LV PW:         1.30 cm  LV E/e' lateral: 12.2 LV IVS:        1.40 cm  LV e' medial:    3.37 cm/s LVOT diam:     2.40 cm  LV E/e' medial:  17.8 LV SV:         63 LV SV Index:   31 LVOT Area:     4.52 cm  RIGHT VENTRICLE RV S prime:     7.72 cm/s TAPSE (M-mode): 2.4 cm LEFT ATRIUM             Index LA diam:        4.00 cm 1.99 cm/m LA Vol (A2C):   49.8 ml 24.74 ml/m LA Vol (A4C):   34.5 ml 17.14 ml/m LA Biplane Vol: 42.5 ml 21.11 ml/m  AORTIC VALVE LVOT Vmax:   84.90 cm/s LVOT Vmean:  57.000 cm/s LVOT VTI:    0.140 m  AORTA Ao Root diam: 3.60 cm MITRAL VALVE MV Area (PHT): 3.85 cm     SHUNTS MV Decel Time: 197 msec     Systemic VTI:  0.14 m MV E velocity: 60.00 cm/s   Systemic Diam: 2.40 cm MV A velocity: 101.00 cm/s MV E/A ratio:  0.59 Jenkins Rouge MD Electronically signed by Jenkins Rouge MD Signature Date/Time: 10/21/2019/10:05:04 AM    Final         Scheduled Meds:  [START ON 10/22/2019] amLODipine  10 mg Oral Daily   aspirin EC  81 mg Oral QPM   clopidogrel  75 mg Oral Daily   enoxaparin (LOVENOX) injection  40 mg Subcutaneous Q24H   insulin aspart  0-5 Units Subcutaneous QHS   insulin aspart  0-9 Units Subcutaneous TID WC   insulin glargine  30 Units Subcutaneous QHS   rosuvastatin  5 mg Oral QPM   sertraline  25 mg Oral Daily   sodium chloride flush  3 mL Intravenous Q12H   Continuous Infusions:   LOS: 0 days    Time spent: 41 minutes spent on chart review, discussion with nursing staff, consultants, updating family and interview/physical exam; more than 50% of that time was spent in counseling and/or coordination of care.    Chisom Muntean J [place] DO [facility] Available via Epic secure chat 7am-7pm After these hours, please refer to coverage provider listed on [email or web address] 10/21/2019, 5:17 PM

## 2019-10-21 NOTE — ED Notes (Signed)
Communicated with Dr. [place] via secure chat and phone, aware of pt SBP200, will place PRN orders for hydralazine and will consider titration of current dose amlodipine based on condition. Clarified that canceled blood transfusion orders are correct; pt is not to receive blood at this time. Verbal orders for eye drops requested by pt also obtained. See [facility]

## 2019-10-21 NOTE — ED Notes (Signed)
PT transported to MRI

## 2019-10-22 LAB — GLUCOSE, CAPILLARY
Glucose-Capillary: 52 mg/dL — ABNORMAL LOW (ref 70–99)
Glucose-Capillary: 165 mg/dL — ABNORMAL HIGH (ref 70–99)
Glucose-Capillary: 155 mg/dL — ABNORMAL HIGH (ref 70–99)
Glucose-Capillary: 196 mg/dL — ABNORMAL HIGH (ref 70–99)

## 2019-10-22 LAB — BASIC METABOLIC PANEL
Anion gap: 8 (ref 5–15)
BUN: 23 mg/dL (ref 8–23)
CO2: 24 mmol/L (ref 22–32)
Calcium: 8.3 mg/dL — ABNORMAL LOW (ref 8.9–10.3)
Chloride: 106 mmol/L (ref 98–111)
Creatinine, Ser: 1.53 mg/dL — ABNORMAL HIGH (ref 0.61–1.24)
GFR calc Af Amer: 52 mL/min — ABNORMAL LOW (ref >60)
GFR calc non Af Amer: 45 mL/min — ABNORMAL LOW (ref >60)
Glucose, Bld: 92 mg/dL (ref 70–99)
Potassium: 4 mmol/L (ref 3.5–5.1)
Sodium: 138 mmol/L (ref 135–145)

## 2019-10-22 LAB — MAGNESIUM: Magnesium: 1.4 mg/dL — ABNORMAL LOW (ref 1.7–2.4)

## 2019-10-22 MED ORDER — MAGNESIUM SULFATE 4 GM/100ML IV SOLN
4.0000 g | Freq: Once | INTRAVENOUS | Status: AC
  Administered 2019-10-22: 4 g via INTRAVENOUS
  Filled 2019-10-22: qty 100

## 2019-10-22 MED ORDER — SODIUM CHLORIDE 0.9 % IV BOLUS
1000.0000 mL | Freq: Once | INTRAVENOUS | Status: AC
  Administered 2019-10-22: 1000 mL via INTRAVENOUS

## 2019-10-22 MED ORDER — INSULIN GLARGINE 100 UNIT/ML ~~LOC~~ SOLN
5.0000 [IU] | Freq: Every day | SUBCUTANEOUS | Status: DC
  Administered 2019-10-22 – 2019-10-24 (×3): 5 [IU] via SUBCUTANEOUS
  Filled 2019-10-22 (×4): qty 0.05

## 2019-10-22 NOTE — Progress Notes (Addendum)
PROGRESS NOTE    Harry [name]  TIR:[id number] DOB: Feb 02, 1946 DOA: 10/20/2019 PCP: [name], MD    Brief Narrative:  Harry [name] is a 74 y.o. male with medical history significant of chronic kidney disease stage III, hypertension, hyperlipidemia, peripheral vascular disease and peripheral neuropathy with previous syncopal episode, CVA and type 2 diabetes poorly controlled who presented to the ER with recurrent syncopal episodes.  He was seen apparently in in the ER event last week.  He passed out at that time seen in the ER and thought it was due to dehydration.  Patient was hydrated and sent home.  Since then he is passed out at least twice.  Per patient in both the time his blood pressure was low and sometimes his sugar would be low.  He is currently off antihypertensives because of hypotension.  His daughter that lives with him witnessed his passing out and noted that he was altered mentally.  He was slow down on the ground was still unresponsive until EMS got there.  They gave him assisted ventilation for about 50 minutes then brought him to the ER.  He is currently fully back to his baseline blood sugar was normal systolic blood pressure up to 180 and currently above 200 in the ER.  He has no recollection of what really happened to him.  Patient has had previous CVA.  He is being admitted with recurrent syncopal episodes..  In the ED, Temperature is 97.9 blood pressure 245/220 pulse 108 respirate of 22 oxygen sats 99% on room air.  Chemistry showed BUN 25 creatinine 1.98.  White count 5.4 hemoglobin 9.7 platelet 256.  COVID-19 negative urinalysis essentially negative A1c of 7.0 TSH 0.963.  Chest x-ray and head CT without contrast were both negative.  Patient being admitted with recurrent syncopal episode for work-up.   Assessment & Plan:   Principal Problem:   Syncope and collapse Active Problems:   Type 2 diabetes mellitus with stage 3 chronic kidney disease, with long-term  current use of insulin (HCC)   Essential hypertension   PAD (peripheral artery disease) (HCC)   Hyperlipidemia   CKD (chronic kidney disease), stage III   Dementia without behavioral disturbance (HCC)  Recurrent syncope The ED with recurrent syncopal episode.  Extremely poorly controlled hypertension, not on any medication regimen at home.  On presentation his systolic blood pressure ranging 180-250.  Daughter denied any seizure-like activity at home.  Patient is afebrile without leukocytosis.  TSH within normal limits.  CT head without acute intracranial findings, but notes multiple chronic microhemorrhages consistent with hypertensive angiopathy.  Chest x-ray no acute cardiopulmonary disease process. TTE with LVEF 60-65%, no LV regional wall motion abnormalities, grade 1 diastolic dysfunction, mildly dilated left atrial size, no aortic stenosis. --Continue to monitor on telemetry --Repeat orthostatic vital signs today --PT/OT evaluation pending --Supportive care  Hypertensive urgency Patient's BP severely elevated on admission with systolics 676-195.  Patient reportedly off antihypertensives for "hypotension". --BP 150/82 this morning, better controlled --Continue amlodipine 10 mg p.o. daily --Hydralazine 25 mg p.o. every 6 hours as needed for SBP greater than 170; received 1 dose of hydralazine yesterday at 1341 --Continue monitor blood pressure closely  Orthostatic hypotension Yesterday patient's line systolic blood pressure was 226 and on standing decreased down to 135.  Etiology likely secondary to poor oral intake. --1 L normal saline bolus today --Repeat orthostatic vital signs this afternoon --Encourage increased oral intake  Hypomagnesemia Magnesium level 1.4, will replete today. --Follow-up magnesium level  tomorrow a.m.  Type 2 diabetes mellitus Goal A1c 7.0, well controlled.  On Lantus 30 units subcutaneously nightly at home. --Will decrease Lantus further to 5 units  subcutaneously nightly while inpatient due to slight hypoglycemia of 52 this morning. --Insulin/scale for further coverage --CBGs before every meal/at bedtime  Peripheral artery disease --Continue Plavix 75 mg p.o. daily and aspirin 81 mg p.o. daily  Hyperlipidemia:  Crestor 5 mg p.o. nightly  Depression: Continue sertraline 25 mg p.o. daily  Hx dementia Patient's daughter reports that patient diagnosed with dementia several years ago by a neurologist.  She is concerned about his ability to function independently at this time. --Delirium precautions --Get up during the day --Encourage a familiar face to remain present throughout the day --Keep blinds open and lights on during daylight hours --Minimize the use of opioids/benzodiazepines   DVT prophylaxis: Lovenox Code Status: Full code Family Communication: None present at bedside, updated patient's daughter [name] via telephone yesterday evening  Disposition Plan:  Status is: Inpatient  Remains inpatient appropriate because:Altered mental status, Ongoing diagnostic testing needed not appropriate for outpatient work up, Unsafe d/c plan and IV treatments appropriate due to intensity of illness or inability to take PO   Dispo: The patient is from: Home              Anticipated d/c is to: Home              Anticipated d/c date is: 2 days              Patient currently is not medically stable to d/c.  Consultants:   None  Procedures:  TTE 10/21/2019: IMPRESSIONS  1. Left ventricular ejection fraction, by estimation, is 60 to 65%. The  left ventricle has normal function. The left ventricle has no regional  wall motion abnormalities. There is mild left ventricular hypertrophy.  Left ventricular diastolic parameters  are consistent with Grade I diastolic dysfunction (impaired relaxation).  2. Right ventricular systolic function is normal. The right ventricular  size is normal.  3. Left atrial size was mildly dilated.  4.  The mitral valve is normal in structure. Trivial mitral valve  regurgitation. No evidence of mitral stenosis.  5. Calcified non coronary cusp . The aortic valve is normal in structure.  Aortic valve regurgitation is not visualized. Mild to moderate aortic  valve sclerosis/calcification is present, without any evidence of aortic  stenosis.  6. The inferior vena cava is normal in size with greater than 50%  respiratory variability, suggesting right atrial pressure of 3 mmHg.  Antimicrobials:   None   Subjective: Patient seen and examined bedside, resting comfortably.  Continues to complain of some mild weakness.  Blood pressure better controlled this morning but not optimal.  Awaiting PT/OT evaluation.  No other complaints or concerns at this time.  Denies headache, no visual changes, no chest pain, palpitations, no shortness of breath, no abdominal pain.  No acute events overnight per nursing staff.  Objective: Vitals:   10/21/19 2020 10/21/19 2255 10/22/19 0333 10/22/19 0937  BP: (!) 154/89 (!) 167/93 (!) 150/82 133/75  Pulse: 88 82 78 86  Resp: 18 17 18 16   Temp: 97.8 F (36.6 C) 98.1 F (36.7 C) 98.2 F (36.8 C) 97.7 F (36.5 C)  TempSrc: Oral Oral Oral Oral  SpO2: 100% 100% 100% 100%    Intake/Output Summary (Last 24 hours) at 10/22/2019 1112 Last data filed at 10/22/2019 0934 Gross per 24 hour  Intake 6 ml  Output  750 ml  Net -744 ml   There were no vitals filed for this visit.  Examination:  General exam: Appears calm and comfortable, elderly in appearance Respiratory system: Clear to auscultation. Respiratory effort normal. Cardiovascular system: S1 & S2 heard, RRR. No JVD, murmurs, rubs, gallops or clicks. No pedal edema. Gastrointestinal system: Abdomen is nondistended, soft and nontender. No organomegaly or masses felt. Normal bowel sounds heard. Central nervous system: Alert and oriented. No focal neurological deficits. Extremities: Symmetric 5 x 5  power. Skin: No rashes, lesions or ulcers Psychiatry: Judgement and insight appear poor. Mood & affect appropriate.     Data Reviewed: I have personally reviewed following labs and imaging studies  CBC: Recent Labs  Lab 10/20/19 1527 10/20/19 2047 10/21/19 0415  WBC 5.4 5.7 6.2  NEUTROABS 3.2  --   --   HGB 9.7* 9.3* 8.9*  HCT 32.6* 31.6* 29.2*  MCV 87.2 87.1 87.2  PLT 256 248 875   Basic Metabolic Panel: Recent Labs  Lab 10/20/19 1527 10/20/19 2047 10/21/19 0415 10/22/19 0126  NA 143  --  139 138  K 3.7  --  3.3* 4.0  CL 107  --  106 106  CO2 27  --  24 24  GLUCOSE 72  --  139* 92  BUN 25*  --  24* 23  CREATININE 1.98* 1.75* 1.66* 1.53*  CALCIUM 8.9  --  8.4* 8.3*  MG  --   --   --  1.4*   GFR: Estimated Creatinine Clearance: 47.9 mL/min (A) (by C-G formula based on SCr of 1.53 mg/dL (H)). Liver Function Tests: Recent Labs  Lab 10/20/19 1527 10/21/19 0415  AST 20 17  ALT 19 16  ALKPHOS 59 49  BILITOT 0.9 0.8  PROT 7.4 6.4*  ALBUMIN 3.7 3.0*   No results for input(s): LIPASE, AMYLASE in the last 168 hours. No results for input(s): AMMONIA in the last 168 hours. Coagulation Profile: No results for input(s): INR, PROTIME in the last 168 hours. Cardiac Enzymes: No results for input(s): CKTOTAL, CKMB, CKMBINDEX, TROPONINI in the last 168 hours. BNP (last 3 results) No results for input(s): PROBNP in the last 8760 hours. HbA1C: Recent Labs    10/20/19 2047  HGBA1C 7.0*   CBG: Recent Labs  Lab 10/21/19 1333 10/21/19 1751 10/21/19 2054 10/21/19 2126 10/22/19 0755  GLUCAP 72 164* 116* 94 52*   Lipid Profile: No results for input(s): CHOL, HDL, LDLCALC, TRIG, CHOLHDL, LDLDIRECT in the last 72 hours. Thyroid Function Tests: Recent Labs    10/20/19 2047  TSH 0.963   Anemia Panel: No results for input(s): VITAMINB12, FOLATE, FERRITIN, TIBC, IRON, RETICCTPCT in the last 72 hours. Sepsis Labs: No results for input(s): PROCALCITON, LATICACIDVEN  in the last 168 hours.  Recent Results (from the past 240 hour(s))  SARS Coronavirus 2 by RT PCR (hospital order, performed in [facility] hospital lab) Nasopharyngeal Nasopharyngeal Swab     Status: None   Collection Time: 10/20/19  4:17 PM   Specimen: Nasopharyngeal Swab  Result Value Ref Range Status   SARS Coronavirus 2 NEGATIVE NEGATIVE Final    Comment: (NOTE) SARS-CoV-2 target nucleic acids are NOT DETECTED.  The SARS-CoV-2 RNA is generally detectable in upper and lower respiratory specimens during the acute phase of infection. The lowest concentration of SARS-CoV-2 viral copies this assay can detect is 250 copies / mL. A negative result does not preclude SARS-CoV-2 infection and should not be used as the sole basis for treatment or  other patient management decisions.  A negative result may occur with improper specimen collection / handling, submission of specimen other than nasopharyngeal swab, presence of viral mutation(s) within the areas targeted by this assay, and inadequate number of viral copies (<250 copies / mL). A negative result must be combined with clinical observations, patient history, and epidemiological information.  Fact Sheet for Patients:   StrictlyIdeas.no  Fact Sheet for Healthcare Providers: BankingDealers.co.za  This test is not yet approved or  cleared by the [place] FDA and has been authorized for detection and/or diagnosis of SARS-CoV-2 by FDA under an Emergency Use Authorization (EUA).  This EUA will remain in effect (meaning this test can be used) for the duration of the COVID-19 declaration under Section 564(b)(1) of the Act, 21 U.S.C. section 360bbb-3(b)(1), unless the authorization is terminated or revoked sooner.  Performed at [facility], [place] [address] [place], [place] [address]          Radiology Studies: CT Head Wo Contrast  Result Date: 10/20/2019 CLINICAL DATA:   Dizziness, loss of consciousness EXAM: CT HEAD WITHOUT CONTRAST TECHNIQUE: Contiguous axial images were obtained from the base of the skull through the vertex without intravenous contrast. COMPARISON:  10/12/2019 FINDINGS: Brain: Stable areas of encephalomalacia are seen within the cerebellum, left occipital lobe, bilateral parietal lobes consistent with prior ischemic change. Chronic small vessel ischemic changes are again noted throughout the periventricular white matter, bilateral basal ganglia, bilateral thalami. No evidence of acute infarct or hemorrhage. Lateral ventricles and remaining midline structures are unremarkable. No acute extra-axial fluid collections. No mass effect. Vascular: Stable atherosclerosis.  No hyperdense vessel. Skull: Normal. Negative for fracture or focal lesion. Sinuses/Orbits: No acute finding. Other: None. IMPRESSION: 1. Stable chronic ischemic changes.  No acute intracranial process. Electronically Signed   By: Randa Ngo M.D.   On: 10/20/2019 16:03   MR BRAIN WO CONTRAST  Result Date: 10/21/2019 CLINICAL DATA:  Recurrent syncope EXAM: MRI HEAD WITHOUT CONTRAST TECHNIQUE: Multiplanar, multiecho pulse sequences of the brain and surrounding structures were obtained without intravenous contrast. COMPARISON:  08/12/2018 FINDINGS: Brain: No acute infarct, acute hemorrhage or extra-axial collection. There are multiple old infarcts, including both occipital lobes in both cerebellar hemispheres. There is multifocal periventricular white matter hyperintensity, most often a result of chronic microvascular ischemia. There is generalized atrophy without lobar predilection. Multiple chronic microhemorrhages in a predominantly central distribution. Normal midline structures. Vascular: Normal flow voids. Skull and upper cervical spine: Normal marrow signal. Sinuses/Orbits: Negative. Other: None. IMPRESSION: 1. No acute intracranial abnormality. 2. Multiple old infarcts, including both  occipital lobes and both cerebellar hemispheres. 3. Multiple chronic microhemorrhages in a predominantly central distribution, most consistent with hypertensive angiopathy. Electronically Signed   By: Ulyses Jarred M.D.   On: 10/21/2019 01:32   DG Chest Port 1 View  Result Date: 10/20/2019 CLINICAL DATA:  Altered level of consciousness, hypotension EXAM: PORTABLE CHEST 1 VIEW COMPARISON:  04/12/2019 FINDINGS: The heart size and mediastinal contours are within normal limits. Both lungs are clear. The visualized skeletal structures are unremarkable. IMPRESSION: No active disease. Electronically Signed   By: Randa Ngo M.D.   On: 10/20/2019 15:33   ECHOCARDIOGRAM COMPLETE  Result Date: 10/21/2019    ECHOCARDIOGRAM REPORT   Patient Name:   JEHIEL KOEPP Date of Exam: 10/21/2019 Medical Rec #:  [id number]        Height:       72.0 in Accession #:    [id number]  Weight:       175.0 lb Date of Birth:  1945/08/28         BSA:          2.013 m Patient Age:    66 years         BP:           161/83 mmHg Patient Gender: M                HR:           80 bpm. Exam Location:  Inpatient Procedure: 2D Echo, Cardiac Doppler and Color Doppler Indications:    Syncope  History:        Patient has prior history of Echocardiogram examinations, most                 recent 08/12/2018. Stroke, Signs/Symptoms:Altered Mental Status                 and Syncope; Risk Factors:Hypertension, Diabetes, Dyslipidemia                 and Former Smoker. CKD.  Sonographer:    Dustin Flock Referring Phys: [place]  1. Left ventricular ejection fraction, by estimation, is 60 to 65%. The left ventricle has normal function. The left ventricle has no regional wall motion abnormalities. There is mild left ventricular hypertrophy. Left ventricular diastolic parameters are consistent with Grade I diastolic dysfunction (impaired relaxation).  2. Right ventricular systolic function is normal. The right ventricular size  is normal.  3. Left atrial size was mildly dilated.  4. The mitral valve is normal in structure. Trivial mitral valve regurgitation. No evidence of mitral stenosis.  5. Calcified non coronary cusp . The aortic valve is normal in structure. Aortic valve regurgitation is not visualized. Mild to moderate aortic valve sclerosis/calcification is present, without any evidence of aortic stenosis.  6. The inferior vena cava is normal in size with greater than 50% respiratory variability, suggesting right atrial pressure of 3 mmHg. FINDINGS  Left Ventricle: Left ventricular ejection fraction, by estimation, is 60 to 65%. The left ventricle has normal function. The left ventricle has no regional wall motion abnormalities. The left ventricular internal cavity size was normal in size. There is  mild left ventricular hypertrophy. Left ventricular diastolic parameters are consistent with Grade I diastolic dysfunction (impaired relaxation). Right Ventricle: The right ventricular size is normal. No increase in right ventricular wall thickness. Right ventricular systolic function is normal. Left Atrium: Left atrial size was mildly dilated. Right Atrium: Right atrial size was normal in size. Pericardium: There is no evidence of pericardial effusion. Mitral Valve: The mitral valve is normal in structure. Normal mobility of the mitral valve leaflets. Trivial mitral valve regurgitation. No evidence of mitral valve stenosis. Tricuspid Valve: The tricuspid valve is normal in structure. Tricuspid valve regurgitation is not demonstrated. No evidence of tricuspid stenosis. Aortic Valve: Calcified non coronary cusp. The aortic valve is normal in structure. Aortic valve regurgitation is not visualized. Mild to moderate aortic valve sclerosis/calcification is present, without any evidence of aortic stenosis. Pulmonic Valve: The pulmonic valve was normal in structure. Pulmonic valve regurgitation is not visualized. No evidence of pulmonic  stenosis. Aorta: The aortic root is normal in size and structure. Venous: The inferior vena cava is normal in size with greater than 50% respiratory variability, suggesting right atrial pressure of 3 mmHg. IAS/Shunts: No atrial level shunt detected by color flow Doppler.  LEFT VENTRICLE PLAX  2D LVIDd:         4.50 cm  Diastology LVIDs:         2.80 cm  LV e' lateral:   4.90 cm/s LV PW:         1.30 cm  LV E/e' lateral: 12.2 LV IVS:        1.40 cm  LV e' medial:    3.37 cm/s LVOT diam:     2.40 cm  LV E/e' medial:  17.8 LV SV:         63 LV SV Index:   31 LVOT Area:     4.52 cm  RIGHT VENTRICLE RV S prime:     7.72 cm/s TAPSE (M-mode): 2.4 cm LEFT ATRIUM             Index LA diam:        4.00 cm 1.99 cm/m LA Vol (A2C):   49.8 ml 24.74 ml/m LA Vol (A4C):   34.5 ml 17.14 ml/m LA Biplane Vol: 42.5 ml 21.11 ml/m  AORTIC VALVE LVOT Vmax:   84.90 cm/s LVOT Vmean:  57.000 cm/s LVOT VTI:    0.140 m  AORTA Ao Root diam: 3.60 cm MITRAL VALVE MV Area (PHT): 3.85 cm     SHUNTS MV Decel Time: 197 msec     Systemic VTI:  0.14 m MV E velocity: 60.00 cm/s   Systemic Diam: 2.40 cm MV A velocity: 101.00 cm/s MV E/A ratio:  0.59 Jenkins Rouge MD Electronically signed by Jenkins Rouge MD Signature Date/Time: 10/21/2019/10:05:04 AM    Final         Scheduled Meds: . amLODipine  10 mg Oral Daily  . aspirin EC  81 mg Oral QPM  . clopidogrel  75 mg Oral Daily  . enoxaparin (LOVENOX) injection  40 mg Subcutaneous Q24H  . feeding supplement (ENSURE ENLIVE)  237 mL Oral BID BM  . insulin aspart  0-5 Units Subcutaneous QHS  . insulin aspart  0-9 Units Subcutaneous TID WC  . insulin glargine  10 Units Subcutaneous QHS  . rosuvastatin  5 mg Oral QPM  . sertraline  25 mg Oral Daily  . sodium chloride flush  3 mL Intravenous Q12H   Continuous Infusions: . magnesium sulfate bolus IVPB 4 g (10/22/19 0931)     LOS: 1 day    Time spent: 37 minutes spent on chart review, discussion with nursing staff, consultants, updating  family and interview/physical exam; more than 50% of that time was spent in counseling and/or coordination of care.    Keyanni Whittinghill J [place] DO [facility] Available via Epic secure chat 7am-7pm After these hours, please refer to coverage provider listed on [email or web address] 10/22/2019, 11:12 AM

## 2019-10-22 NOTE — Progress Notes (Signed)
Patient's daughter called for talking to Social worker regarding placement. No one talked to her for 2 days. Will pass on night shift nurse regarding this. HS [employer]
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
## 2019-10-22 NOTE — Evaluation (Signed)
Occupational Therapy Evaluation Patient Details Name: Harry [name] MRN: [id number] DOB: 09/05/1945 Today's Date: 10/22/2019    History of Present Illness Harry [name] is a 74 y.o. male with medical history significant of chronic kidney disease stage III, hypertension, hyperlipidemia, peripheral vascular disease and peripheral neuropathy with previous syncopal episode, CVA and type 2 diabetes poorly controlled who presented to the ER with recurrent syncopal episodes.   Clinical Impression   PTA, pt lives with family and reports typically Modified Independent with ADLs and mobility using Rollator. Pt admitted due to recurrent syncopal episodes and presents with deficits in strength, balance, endurance, and safety awareness. Pt denies dizziness throughout session until transferred to recliner, but positive orthostatic readings (see below). Pt required Mod A for simple transfers with RW, consistent cues needed for safety. Pt requires Min A for UB ADLs and Max A for LB ADLs. Pt unsafe to return home at his current functional abilities. Recommend SNF for short term rehab.   BP readings: Lying: 206/85  Sitting: 122/76  Standing: 93/60  After transfer: 75/55  2 minutes seated in recliner (feet elevated): 141/101    Follow Up Recommendations  SNF;Supervision/Assistance - 24 hour    Equipment Recommendations  3 in 1 bedside commode    Recommendations for Other Services       Precautions / Restrictions Precautions Precautions: Fall Restrictions Weight Bearing Restrictions: No      Mobility Bed Mobility Overal bed mobility: Needs Assistance Bed Mobility: Supine to Sit     Supine to sit: Supervision;HOB elevated     General bed mobility comments: Supervision for safety, increased time  Transfers Overall transfer level: Needs assistance Equipment used: Rolling walker (2 wheeled) Transfers: Sit to/from [facility] Sit to Stand: Min assist Stand pivot  transfers: Mod assist       General transfer comment: Min A for power up, increased time/effort. Pt Mod A for stand pivot to recliner chair with RW. Frequent cues for safety and pt attempting to prematurely sit    Balance Overall balance assessment: Needs assistance;History of Falls Sitting-balance support: No upper extremity supported;Feet supported Sitting balance-Leahy Scale: Fair     Standing balance support: Bilateral upper extremity supported;During functional activity Standing balance-Leahy Scale: Poor Standing balance comment: reliant on support, reaching for armrests                           ADL either performed or assessed with clinical judgement   ADL Overall ADL's : Needs assistance/impaired Eating/Feeding: Set up;Sitting Eating/Feeding Details (indicate cue type and reason): Setup, assistance to open containers Grooming: Set up;Wash/dry face;Sitting Grooming Details (indicate cue type and reason): seated in recliner Upper Body Bathing: Minimal assistance;Sitting   Lower Body Bathing: Maximal assistance;Sit to/from stand   Upper Body Dressing : Minimal assistance;Sitting   Lower Body Dressing: Maximal assistance;Sit to/from stand   Toilet Transfer: Moderate assistance;Cueing for safety;Cueing for sequencing;Stand-pivot;RW Toilet Transfer Details (indicate cue type and reason): simulated to recliner, Mod A for transfer with pt difficulty following cues (left RW behind, reaching out for chair armrests). Pt attempting to sit prematurely Toileting- Clothing Manipulation and Hygiene: Maximal assistance;Sit to/from stand         General ADL Comments: Pt with decreased strength, endurance, balance, and safety awareness impacting ability to complete ADLs/transfers without physical assist     Vision Baseline Vision/History: Wears glasses (cloudy matter over L eye) Wears Glasses: Reading only;Distance only Patient Visual Report: Diplopia;Other (comment)  (  noted with cloudy covering over L eye ) Vision Assessment?: Vision impaired- to be further tested in functional context Additional Comments: Cloudy covering over L eye (cataract? glaucoma?), reports double vision at times but could not specify more     Perception     Praxis      Pertinent Vitals/Pain Pain Assessment: No/denies pain     Hand Dominance Right   Extremity/Trunk Assessment Upper Extremity Assessment Upper Extremity Assessment: Generalized weakness   Lower Extremity Assessment Lower Extremity Assessment: Defer to PT evaluation   Cervical / Trunk Assessment Cervical / Trunk Assessment: Normal   Communication Communication Communication: No difficulties   Cognition Arousal/Alertness: Awake/alert Behavior During Therapy: WFL for tasks assessed/performed Overall Cognitive Status: No family/caregiver present to determine baseline cognitive functioning Area of Impairment: Orientation;Safety/judgement;Awareness;Problem solving                 Orientation Level: Time (reports september, october)       Safety/Judgement: Decreased awareness of safety;Decreased awareness of deficits Awareness: Emergent Problem Solving: Difficulty sequencing;Requires verbal cues;Requires tactile cues General Comments: Pt cooperative and oriented to situation. Difficulty following safety cues once in standing. Assistance needed to use phone in room. Pt initially thought call bell was phone, increased time/effort needed to call family/friends on phone per pt request, but each number called was no longer in service   General Comments  Pt positive for orthostatic hypotension, but denied dizziness or lightheadedness until after transfer.     Exercises     Shoulder Instructions      Home Living Family/patient expects to be discharged to:: Private residence Living Arrangements: Children Available Help at Discharge: Family Type of Home: House Home Access: Level entry     Home  Layout: Two level Alternate Level Stairs-Number of Steps: flight Alternate Level Stairs-Rails: Right;Left Bathroom Shower/Tub: Occupational psychologist: Standard     Home Equipment: Environmental consultant - 4 wheels;Shower seat;Grab bars - tub/shower;Cane - single point          Prior Functioning/Environment Level of Independence: Independent with assistive device(s)        Comments: Pt reports use of Rollator inside of the home, able to complete ADLs, reports family does most IADLs. pt has not driven since January        OT Problem List: Decreased strength;Decreased activity tolerance;Impaired balance (sitting and/or standing);Decreased cognition;Decreased safety awareness;Decreased knowledge of use of DME or AE      OT Treatment/Interventions: Self-care/ADL training;Therapeutic exercise;Energy conservation;DME and/or AE instruction;Therapeutic activities;Patient/family education    OT Goals(Current goals can be found in the care plan section) Acute Rehab OT Goals Patient Stated Goal: eat some lunch, be able to walk with a cane OT Goal Formulation: With patient Time For Goal Achievement: 11/05/19 Potential to Achieve Goals: Good ADL Goals Pt Will Perform Grooming: with supervision;standing Pt Will Perform Lower Body Bathing: with supervision;sit to/from stand Pt Will Perform Lower Body Dressing: with supervision;sit to/from stand Pt Will Transfer to Toilet: with supervision;ambulating;bedside commode Pt Will Perform Toileting - Clothing Manipulation and hygiene: with supervision;sit to/from stand  OT Frequency: Min 2X/week   Barriers to D/C:            Co-evaluation              AM-PAC OT "6 Clicks" Daily Activity     Outcome Measure Help from another person eating meals?: A Little Help from another person taking care of personal grooming?: A Little Help from another person toileting, which includes using toliet,  bedpan, or urinal?: A Lot Help from another person  bathing (including washing, rinsing, drying)?: A Lot Help from another person to put on and taking off regular upper body clothing?: A Little Help from another person to put on and taking off regular lower body clothing?: A Lot 6 Click Score: 15   End of Session Equipment Utilized During Treatment: Gait belt;Rolling walker Nurse Communication: Mobility status  Activity Tolerance: Patient tolerated treatment well Patient left: in chair;with call bell/phone within reach;with chair alarm set  OT Visit Diagnosis: Unsteadiness on feet (R26.81);Other abnormalities of gait and mobility (R26.89);Muscle weakness (generalized) (M62.81);History of falling (Z91.81)                Time: 7573-2256 OT Time Calculation (min): 40 min Charges:  OT General Charges $OT Visit: 1 Visit OT Evaluation $OT Eval Moderate Complexity: 1 Mod OT Treatments $Self Care/Home Management : 8-22 mins $Therapeutic Activity: 8-22 mins  Layla Maw, OTR/L  Layla Maw 10/22/2019, 11:53 AM

## 2019-10-22 NOTE — Progress Notes (Signed)
  Speech Language Pathology  Patient Details Name: Harry [name] MRN: [id number] DOB: June 15, 1945 Today's Date: 10/22/2019 Time:  -     Order received for swallow eval written yesterday. MRI negative for acute CVA. Spoke with RN who has not osberved pt having difficulty with po's. CXR no active disease. Formal swallow assessment does not appear warranted at this time.                Houston Siren 10/22/2019, 4:42 PM  Orbie Pyo Colvin Caroli.Ed Risk analyst [phone] Office [phone]

## 2019-10-22 NOTE — Evaluation (Signed)
Physical Therapy Evaluation Patient Details Name: Harry [name] MRN: [id number] DOB: 09-26-1945 Today's Date: 10/22/2019   History of Present Illness  Harry [name] is a 74 y.o. male with medical history significant of chronic kidney disease stage III, hypertension, hyperlipidemia, peripheral vascular disease and peripheral neuropathy with previous syncopal episode, CVA and type 2 diabetes poorly controlled who presented to the ER with recurrent syncopal episodes.    Clinical Impression  Patient was sitting up in recliner chair eating lunch at the start of his physical therapy session. While reclined in chair BP was 102/68. UE and LE MMT WNL bilaterally. UE and LE sensation intact. Patient required repeated cueing, showed decreased initiation of movements, and slow processing. Patient sat more upright in chair when attempting to transfer to stand and stated he was feeling "woozy" and starting having some visual changes. BP dropped to 89/59. Attempted to stand with RW. He required a totalAx2 with the RW to partially stand. He was not able to extend his hips and immediately sat back down in the chair. Patient started to become very lethargic, showed more signs of confusion, and had a difficulty keeping his eyes open. We immediately lifted his legs in the chair and reclined him backwards. BP returned up to 112/67. Called for charge RN due to sudden lethargy. Became more awake once he was laying backwards. MinAx2 transfer into the Stedy to sit EOB. TotalAx2 sit to supine in bed. Patient was left laying in bed with all needs within reach and bed alarm set.       Follow Up Recommendations Supervision/Assistance - 24 hour;SNF    Equipment Recommendations  Rolling walker with 5" wheels;3in1 (PT);Other (comment) (defer to next venue)    Recommendations for Other Services       Precautions / Restrictions Precautions Precautions: Fall Precaution Comments: orthostatic, dementia      Mobility   Bed Mobility Overal bed mobility: Needs Assistance Bed Mobility: Sit to Supine     Supine to sit: Total assist;+2 for physical assistance     General bed mobility comments: TotalAx2 to get back in bed  Transfers Overall transfer level: Needs assistance Equipment used: Rolling walker (2 wheeled) Transfers: Sit to/from Stand Sit to Stand: Total assist;+2 physical assistance         General transfer comment: Patient unable to initiate standing for sit to stand. TotalAx2 to partial stand w/ RW, patient was not able to extend hips. partially stood for a few seconds. frequent cueing for hand placement. Transferred back into bed using the Stedy.  Ambulation/Gait                Stairs            Wheelchair Mobility    Modified Rankin (Stroke Patients Only)       Balance Overall balance assessment: Needs assistance;History of Falls Sitting-balance support: No upper extremity supported;Feet supported Sitting balance-Leahy Scale: Fair     Standing balance support: Bilateral upper extremity supported;During functional activity Standing balance-Leahy Scale: Zero Standing balance comment: unable to fully stand, totalAx2 to attempt, sat immediately                             Pertinent Vitals/Pain Pain Assessment: Faces Faces Pain Scale: Hurts little more Pain Location: dizziness, pain in eyes Pain Descriptors / Indicators: Discomfort Pain Intervention(s): Monitored during session;Limited activity within patient's tolerance    Home Living Family/patient expects to be discharged to:: Private  residence Living Arrangements: Children Available Help at Discharge: Family Type of Home: House Home Access: Level entry     [place]: [place]: Environmental consultant - 4 wheels;Shower seat;Grab bars - tub/shower;Cane - single point      Prior Function Level of Independence: Independent with assistive device(s)         Comments: Pt reports use of  Rollator inside of the home, able to complete ADLs, reports family does most IADLs. pt has not driven since January     Hand Dominance   Dominant Hand: Right    Extremity/Trunk Assessment   Upper Extremity Assessment Upper Extremity Assessment: Defer to OT evaluation    Lower Extremity Assessment Lower Extremity Assessment: Overall WFL for tasks assessed    Cervical / Trunk Assessment Cervical / Trunk Assessment: Normal  Communication   Communication: No difficulties  Cognition Arousal/Alertness: Lethargic Behavior During Therapy: WFL for tasks assessed/performed Overall Cognitive Status: No family/caregiver present to determine baseline cognitive functioning Area of Impairment: Orientation;Safety/judgement;Awareness;Problem solving;Following commands;Attention                   Current Attention Level: Sustained   Following Commands: Follows one step commands inconsistently;Follows one step commands with increased time Safety/Judgement: Decreased awareness of safety;Decreased awareness of deficits Awareness: Intellectual Problem Solving: Difficulty sequencing;Requires verbal cues;Requires tactile cues;Slow processing;Decreased initiation General Comments: Pt started session awake and alert and became suddly lethargic during eval. difficulty following commands. difficulty initiating movements. very slow processing      General Comments General comments (skin integrity, edema, etc.): showed orthostatic changes from sitting in reclined position to sitting upright. c/o dizziness with visual changes sitting up. became extremely lethargic once sitting upright.    Exercises     Assessment/Plan    PT Assessment Patient needs continued PT services  PT Problem List Decreased strength;Decreased activity tolerance;Decreased balance;Decreased safety awareness       PT Treatment Interventions DME instruction;Gait training;Patient/family education;Therapeutic  exercise;Balance training    PT Goals (Current goals can be found in the Care Plan section)  Acute Rehab PT Goals Patient Stated Goal: eat some lunch, be able to walk with a cane PT Goal Formulation: With patient Time For Goal Achievement: 10/22/19 Potential to Achieve Goals: Good    Frequency Min 2X/week   Barriers to discharge        Co-evaluation               AM-PAC PT "6 Clicks" Mobility  Outcome Measure Help needed turning from your back to your side while in a flat bed without using bedrails?: A Little Help needed moving from lying on your back to sitting on the side of a flat bed without using bedrails?: A Lot Help needed moving to and from a bed to a chair (including a wheelchair)?: Total Help needed standing up from a chair using your arms (e.g., wheelchair or bedside chair)?: Total Help needed to walk in hospital room?: Total Help needed climbing 3-5 steps with a railing? : Total 6 Click Score: 9    End of Session Equipment Utilized During Treatment: Gait belt Activity Tolerance: Patient limited by lethargy;Other (comment) (limited by orthostatics) Patient left: in bed;with bed alarm set;with call bell/phone within reach Nurse Communication: Mobility status PT Visit Diagnosis: Unsteadiness on feet (R26.81);Other symptoms and signs involving the nervous system (R29.898);Other abnormalities of gait and mobility (R26.89)    Time:  -      Charges:  Livingston Diones, SPT, ATC

## 2019-10-22 NOTE — Progress Notes (Signed)
Patient wasn't straight his arm and hard to finish NS bolus and Mg 4g IVPB for more than 12 hours. PT checked orthostatic V/S and it is positive. HS [employer]
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
## 2019-10-22 NOTE — Progress Notes (Signed)
Nutrition Brief Note  Patient identified on the Malnutrition Screening Tool (MST) Report  Wt Readings from Last 15 Encounters:  10/13/19 79.4 kg  12/19/18 81.6 kg  10/10/18 79.4 kg  09/02/18 77.8 kg  08/12/18 81.6 kg  08/08/18 80.7 kg  06/27/18 79.9 kg  04/05/18 79.9 kg  03/17/18 82.1 kg  02/25/18 78.5 kg  11/23/17 80.3 kg  11/17/17 78.2 kg  10/09/17 78.7 kg  02/18/17 83 kg  02/01/17 82.6 kg    There is no height or weight on file to calculate BMI.   Current diet order is Heart Healthy/Carb Modified. No PO intake documented; however, pt states his appetite is good and states that he is hungry now. Pt had just finished placing a lunch order at time of RD visit. Pt states that PTA, he ate at least 3 meals per day and denies any changes to his appetite.  Labs: Mg 1.4 (L), CBGs 52-165 Medications: Ensure Enlive BID, Novolog, Lantus  No nutrition interventions warranted at this time. If nutrition issues arise, please consult RD.   Larkin Ina, MS, RD, LDN RD pager number and weekend/on-call pager number located in [place].

## 2019-10-23 LAB — BASIC METABOLIC PANEL
Anion gap: 5 (ref 5–15)
BUN: 25 mg/dL — ABNORMAL HIGH (ref 8–23)
CO2: 25 mmol/L (ref 22–32)
Calcium: 8.3 mg/dL — ABNORMAL LOW (ref 8.9–10.3)
Chloride: 105 mmol/L (ref 98–111)
Creatinine, Ser: 1.6 mg/dL — ABNORMAL HIGH (ref 0.61–1.24)
GFR calc Af Amer: 49 mL/min — ABNORMAL LOW (ref >60)
GFR calc non Af Amer: 42 mL/min — ABNORMAL LOW (ref >60)
Glucose, Bld: 145 mg/dL — ABNORMAL HIGH (ref 70–99)
Potassium: 4.2 mmol/L (ref 3.5–5.1)
Sodium: 135 mmol/L (ref 135–145)

## 2019-10-23 LAB — MAGNESIUM: Magnesium: 2 mg/dL (ref 1.7–2.4)

## 2019-10-23 LAB — GLUCOSE, CAPILLARY
Glucose-Capillary: 122 mg/dL — ABNORMAL HIGH (ref 70–99)
Glucose-Capillary: 135 mg/dL — ABNORMAL HIGH (ref 70–99)
Glucose-Capillary: 116 mg/dL — ABNORMAL HIGH (ref 70–99)
Glucose-Capillary: 168 mg/dL — ABNORMAL HIGH (ref 70–99)
Glucose-Capillary: 121 mg/dL — ABNORMAL HIGH (ref 70–99)

## 2019-10-23 MED ORDER — SODIUM CHLORIDE 0.9 % IV BOLUS
1000.0000 mL | Freq: Once | INTRAVENOUS | Status: AC
  Administered 2019-10-23: 1000 mL via INTRAVENOUS

## 2019-10-23 NOTE — Progress Notes (Signed)
PROGRESS NOTE    Harry [name]  IOX:[id number] DOB: Jun 22, 1945 DOA: 10/20/2019 PCP: [name], MD    Brief Narrative:  Harry [name] is a 74 y.o. male with medical history significant of chronic kidney disease stage III, hypertension, hyperlipidemia, peripheral vascular disease and peripheral neuropathy with previous syncopal episode, CVA and type 2 diabetes poorly controlled who presented to the ER with recurrent syncopal episodes.  He was seen apparently in in the ER event last week.  He passed out at that time seen in the ER and thought it was due to dehydration.  Patient was hydrated and sent home.  Since then he is passed out at least twice.  Per patient in both the time his blood pressure was low and sometimes his sugar would be low.  He is currently off antihypertensives because of hypotension.  His daughter that lives with him witnessed his passing out and noted that he was altered mentally.  He was  down on the ground and still unresponsive until EMS got there.  They gave him assisted ventilation for about 50 minutes then brought him to the ER.   He is currently fully back to his baseline in ED. Blood sugar was normal systolic blood pressure up to 180 and currently above 200 in the ER.  He has no recollection of what really happened to him.  Patient has had previous CVA.    In the ED, Temperature is 97.9 blood pressure 245/220 pulse 108 respirate of 22 oxygen sats 99% on room air.  Chemistry showed BUN 25 creatinine 1.98.  White count 5.4 hemoglobin 9.7 platelet 256.  COVID-19 negative urinalysis essentially negative A1c of 7.0 TSH 0.963.  Chest x-ray and head CT without contrast were both negative.  Patient being admitted with recurrent syncopal episode for work-up.   Assessment & Plan:   Principal Problem:   Syncope and collapse Active Problems:   Type 2 diabetes mellitus with stage 3 chronic kidney disease, with long-term current use of insulin (HCC)   Essential  hypertension   PAD (peripheral artery disease) (HCC)   Hyperlipidemia   CKD (chronic kidney disease), stage III   Dementia without behavioral disturbance (HCC)  Recurrent syncope The ED with recurrent syncopal episode.  Extremely poorly controlled hypertension, not on any medication regimen at home.  On presentation his systolic blood pressure ranging 180-250.  Daughter denied any seizure-like activity at home.  Patient is afebrile without leukocytosis.  TSH within normal limits.  CT head without acute intracranial findings, but notes multiple chronic microhemorrhages consistent with hypertensive angiopathy.  Chest x-ray no acute cardiopulmonary disease process. TTE with LVEF 60-65%, no LV regional wall motion abnormalities, grade 1 diastolic dysfunction, mildly dilated left atrial size, no aortic stenosis. --Continue to monitor on telemetry --Repeat orthostatic vital signs today --PT/OT evaluation pending --Supportive care  Hypertensive urgency Patient's BP severely elevated on admission with systolics 268-341.  Patient reportedly off antihypertensives for "hypotension". --BP 166/82 this morning, better controlled --Continue amlodipine 10 mg p.o. daily --Hydralazine 25 mg p.o. every 6 hours as needed for SBP greater than 170; received 1 dose of hydralazine yesterday at 1341 --Continue monitor blood pressure closely  Orthostatic hypotension Continues with orthostasis despite IV fluid hydration.  Etiology likely multifactorial given his underlying dementia, autonomic dysfunction with poor baroreceptor response versus medication with SSRI.  --1 L normal saline bolus today --Discontinue Zoloft today --Repeat orthostatic vital signs this afternoon --Encourage increased oral intake  Hypomagnesemia Magnesium level 2.0, today --Monitor electrolytes closely  daily  Type 2 diabetes mellitus Hemoglobin A1C 7.0, well controlled.  On Lantus 30 units subcutaneously nightly at home. --Lantus 5  units subcutaneously nightly. --Insulin/scale for further coverage --CBGs before every meal/at bedtime  Peripheral artery disease --Continue Plavix 75 mg p.o. daily and aspirin 81 mg p.o. daily  Hyperlipidemia:  Crestor 5 mg p.o. nightly  Depression: Discontinue Zoloft today as SSRIs have side effect profile causing orthostasis  Hx dementia Patient's daughter reports that patient diagnosed with dementia several years ago by a neurologist.  She is concerned about his ability to function independently at this time. --Delirium precautions --Get up during the day --Encourage a familiar face to remain present throughout the day --Keep blinds open and lights on during daylight hours --Minimize the use of opioids/benzodiazepines   DVT prophylaxis: Lovenox Code Status: Full code Family Communication: None present at bedside, updated patient's daughter Harry [name] via telephone yesterday evening  Disposition Plan:  Status is: Inpatient  Remains inpatient appropriate because:Altered mental status, Ongoing diagnostic testing needed not appropriate for outpatient work up, Unsafe d/c plan and IV treatments appropriate due to intensity of illness or inability to take PO   Dispo: The patient is from: Home              Anticipated d/c is to: Home              Anticipated d/c date is: 2 days              Patient currently is not medically stable to d/c.  Consultants:   None  Procedures:  TTE 10/21/2019: IMPRESSIONS  1. Left ventricular ejection fraction, by estimation, is 60 to 65%. The  left ventricle has normal function. The left ventricle has no regional  wall motion abnormalities. There is mild left ventricular hypertrophy.  Left ventricular diastolic parameters  are consistent with Grade I diastolic dysfunction (impaired relaxation).  2. Right ventricular systolic function is normal. The right ventricular  size is normal.  3. Left atrial size was mildly dilated.  4. The mitral  valve is normal in structure. Trivial mitral valve  regurgitation. No evidence of mitral stenosis.  5. Calcified non coronary cusp . The aortic valve is normal in structure.  Aortic valve regurgitation is not visualized. Mild to moderate aortic  valve sclerosis/calcification is present, without any evidence of aortic  stenosis.  6. The inferior vena cava is normal in size with greater than 50%  respiratory variability, suggesting right atrial pressure of 3 mmHg.  Antimicrobials:   None   Subjective: Patient seen and examined bedside, resting comfortably.  Continues with weakness and persistent orthostasis.  Otherwise no other complaints.  Updated patient's daughter Harry [name] via telephone this afternoon. Denies headache, no visual changes, no chest pain, palpitations, no shortness of breath, no abdominal pain.  No acute events overnight per nursing staff.  Objective: Vitals:   10/22/19 1947 10/23/19 0542 10/23/19 0623 10/23/19 0810  BP: (!) 166/82   (!) 189/91  Pulse: 89   85  Resp: 17   20  Temp: 97.8 F (36.6 C)   97.8 F (36.6 C)  TempSrc: Oral     SpO2: 100%   98%  Weight:  85.3 kg 85.4 kg     Intake/Output Summary (Last 24 hours) at 10/23/2019 1245 Last data filed at 10/23/2019 1103 Gross per 24 hour  Intake 600 ml  Output 1850 ml  Net -1250 ml   Filed Weights   10/23/19 0542 10/23/19 0623  Weight: 85.3 kg  85.4 kg    Examination:  General exam: Appears calm and comfortable, elderly in appearance, pleasantly confused Respiratory system: Clear to auscultation. Respiratory effort normal. Cardiovascular system: S1 & S2 heard, RRR. No JVD, murmurs, rubs, gallops or clicks. No pedal edema. Gastrointestinal system: Abdomen is nondistended, soft and nontender. No organomegaly or masses felt. Normal bowel sounds heard. Central nervous system: Alert and oriented. No focal neurological deficits. Extremities: Symmetric 5 x 5 power. Skin: No rashes, lesions or  ulcers Psychiatry: Judgement and insight appear poor. Mood & affect appropriate.     Data Reviewed: I have personally reviewed following labs and imaging studies  CBC: Recent Labs  Lab 10/20/19 1527 10/20/19 2047 10/21/19 0415  WBC 5.4 5.7 6.2  NEUTROABS 3.2  --   --   HGB 9.7* 9.3* 8.9*  HCT 32.6* 31.6* 29.2*  MCV 87.2 87.1 87.2  PLT 256 248 762   Basic Metabolic Panel: Recent Labs  Lab 10/20/19 1527 10/20/19 2047 10/21/19 0415 10/22/19 0126 10/23/19 0115  NA 143  --  139 138 135  K 3.7  --  3.3* 4.0 4.2  CL 107  --  106 106 105  CO2 27  --  24 24 25   GLUCOSE 72  --  139* 92 145*  BUN 25*  --  24* 23 25*  CREATININE 1.98* 1.75* 1.66* 1.53* 1.60*  CALCIUM 8.9  --  8.4* 8.3* 8.3*  MG  --   --   --  1.4* 2.0   GFR: Estimated Creatinine Clearance: 45.8 mL/min (A) (by C-G formula based on SCr of 1.6 mg/dL (H)). Liver Function Tests: Recent Labs  Lab 10/20/19 1527 10/21/19 0415  AST 20 17  ALT 19 16  ALKPHOS 59 49  BILITOT 0.9 0.8  PROT 7.4 6.4*  ALBUMIN 3.7 3.0*   No results for input(s): LIPASE, AMYLASE in the last 168 hours. No results for input(s): AMMONIA in the last 168 hours. Coagulation Profile: No results for input(s): INR, PROTIME in the last 168 hours. Cardiac Enzymes: No results for input(s): CKTOTAL, CKMB, CKMBINDEX, TROPONINI in the last 168 hours. BNP (last 3 results) No results for input(s): PROBNP in the last 8760 hours. HbA1C: Recent Labs    10/20/19 2047  HGBA1C 7.0*   CBG: Recent Labs  Lab 10/22/19 1610 10/22/19 2023 10/23/19 0541 10/23/19 0807 10/23/19 1152  GLUCAP 155* 196* 122* 135* 116*   Lipid Profile: No results for input(s): CHOL, HDL, LDLCALC, TRIG, CHOLHDL, LDLDIRECT in the last 72 hours. Thyroid Function Tests: Recent Labs    10/20/19 2047  TSH 0.963   Anemia Panel: No results for input(s): VITAMINB12, FOLATE, FERRITIN, TIBC, IRON, RETICCTPCT in the last 72 hours. Sepsis Labs: No results for input(s):  PROCALCITON, LATICACIDVEN in the last 168 hours.  Recent Results (from the past 240 hour(s))  SARS Coronavirus 2 by RT PCR (hospital order, performed in [facility] hospital lab) Nasopharyngeal Nasopharyngeal Swab     Status: None   Collection Time: 10/20/19  4:17 PM   Specimen: Nasopharyngeal Swab  Result Value Ref Range Status   SARS Coronavirus 2 NEGATIVE NEGATIVE Final    Comment: (NOTE) SARS-CoV-2 target nucleic acids are NOT DETECTED.  The SARS-CoV-2 RNA is generally detectable in upper and lower respiratory specimens during the acute phase of infection. The lowest concentration of SARS-CoV-2 viral copies this assay can detect is 250 copies / mL. A negative result does not preclude SARS-CoV-2 infection and should not be used as the sole basis for treatment  or other patient management decisions.  A negative result may occur with improper specimen collection / handling, submission of specimen other than nasopharyngeal swab, presence of viral mutation(s) within the areas targeted by this assay, and inadequate number of viral copies (<250 copies / mL). A negative result must be combined with clinical observations, patient history, and epidemiological information.  Fact Sheet for Patients:   StrictlyIdeas.no  Fact Sheet for Healthcare Providers: BankingDealers.co.za  This test is not yet approved or  cleared by the [place] FDA and has been authorized for detection and/or diagnosis of SARS-CoV-2 by FDA under an Emergency Use Authorization (EUA).  This EUA will remain in effect (meaning this test can be used) for the duration of the COVID-19 declaration under Section 564(b)(1) of the Act, 21 U.S.C. section 360bbb-3(b)(1), unless the authorization is terminated or revoked sooner.  Performed at [facility], [place] [address], [place], [place] [address]          Radiology Studies: No results found.      Scheduled  Meds: . amLODipine  10 mg Oral Daily  . aspirin EC  81 mg Oral QPM  . clopidogrel  75 mg Oral Daily  . enoxaparin (LOVENOX) injection  40 mg Subcutaneous Q24H  . feeding supplement (ENSURE ENLIVE)  237 mL Oral BID BM  . insulin aspart  0-5 Units Subcutaneous QHS  . insulin aspart  0-9 Units Subcutaneous TID WC  . insulin glargine  5 Units Subcutaneous QHS  . rosuvastatin  5 mg Oral QPM  . sertraline  25 mg Oral Daily  . sodium chloride flush  3 mL Intravenous Q12H   Continuous Infusions:    LOS: 2 days    Time spent: 37 minutes spent on chart review, discussion with nursing staff, consultants, updating family and interview/physical exam; more than 50% of that time was spent in counseling and/or coordination of care.    Shakira Los J [place] DO [facility] Available via Epic secure chat 7am-7pm After these hours, please refer to coverage provider listed on [email or web address] 10/23/2019, 12:45 PM

## 2019-10-23 NOTE — TOC Progression Note (Addendum)
Transition of Care [facility]) - Progression Note    Patient Details  Name: Harry [name] MRN: [id number] Date of Birth: 1945/06/26  Transition of Care [facility] CM/SW Contact  Joanne Chars, LCSW Phone Number: 10/23/2019, 9:57 AM  Clinical Narrative:  CSW spoke with pt daughter, [name], who reports pt dementia is significant.  [name] is Therapist, sports and saw note in mychart regarding pt living in [place]--this is not accurate, lives in [place].  He also has already had his first moderna vaccine shot but, per [name], is not yet due for #2.  [name] is interested in SNF placement at [facility] and CSW informed her that pt does have bed offer from them.  Discussed other offers and she would [facility] to [facility].   CSW spoke with pt who is in agreement with [facility].  Also spoke with Juliann Pulse from [facility] care that probably discharge Monday.      Expected Discharge Plan: Skilled Nursing Facility Barriers to Discharge: Continued Medical Work up  Expected Discharge Plan and Services Expected Discharge Plan: [place]                                               Social Determinants of Health (SDOH) Interventions    Readmission Risk Interventions Readmission Risk Prevention Plan 09/02/2018 08/14/2018  Transportation Screening Complete Complete  PCP or Specialist Appt within 3-5 Days - Complete  HRI or Home Care Consult - Not Complete  HRI or Home Care Consult comments - pt discharging to SNF  Social Work Consult for [place] Planning/Counseling - Not Complete  SW consult not completed comments - no needs  Palliative Care Screening - Not Applicable  Medication Review (RN Care Manager) - Referral to Pharmacy  PCP or Specialist appointment within 3-5 days of discharge Complete -  SW Recovery Care/Counseling Consult Complete -  Palliative Care Screening Not Applicable -  Skilled Nursing Facility Complete -  Some recent  data might be hidden

## 2019-10-23 NOTE — Progress Notes (Signed)
Nutrition Brief Note  RD consulted to assess pt's nutrition requirements/status.   Pt reports good appetite now and PTA. When asked if he typically eats 3 meals per day, pt responded "at least that." Pt includes protein at each meal. Pt denies any changes to his appetite. RD observed completed Ensure on pt's table. Pt states he enjoyed the supplement and wants to continue receiving them.  Current diet order is Heart Healthy/Carb Modified, patient is consuming 100% of meals at this time.   Wt Readings from Last 15 Encounters:  10/23/19 85.4 kg  10/13/19 79.4 kg  12/19/18 81.6 kg  10/10/18 79.4 kg  09/02/18 77.8 kg  08/12/18 81.6 kg  08/08/18 80.7 kg  06/27/18 79.9 kg  04/05/18 79.9 kg  03/17/18 82.1 kg  02/25/18 78.5 kg  11/23/17 80.3 kg  11/17/17 78.2 kg  10/09/17 78.7 kg  02/18/17 83 kg   No significant wt changes noted.   Body mass index is 25.53 kg/m. Patient meets criteria for overweight based on current BMI.   Labs: CBGs [phone] Medications: Ensure Enlive BID, Novolog, Lantus  Last bowel movement: 8/19 Reviewed RN skin assessment.   Estimated Nutritional Needs: Kcal: 25-30 kcal/kg Protein: > 1 gram protein/kg Fluid: 1 ml/kcal  Pt already with oral nutrition supplement ordered and pt is consuming it well. Pt is meeting 100% estimated nutritional needs. No additional nutrition interventions warranted at this time. If nutrition issues arise, please consult RD.   Larkin Ina, MS, RD, LDN RD pager number and weekend/on-call pager number located in [place].

## 2019-10-24 LAB — GLUCOSE, CAPILLARY
Glucose-Capillary: 64 mg/dL — ABNORMAL LOW (ref 70–99)
Glucose-Capillary: 83 mg/dL (ref 70–99)
Glucose-Capillary: 173 mg/dL — ABNORMAL HIGH (ref 70–99)
Glucose-Capillary: 206 mg/dL — ABNORMAL HIGH (ref 70–99)
Glucose-Capillary: 268 mg/dL — ABNORMAL HIGH (ref 70–99)

## 2019-10-24 LAB — BASIC METABOLIC PANEL
Anion gap: 8 (ref 5–15)
BUN: 26 mg/dL — ABNORMAL HIGH (ref 8–23)
CO2: 24 mmol/L (ref 22–32)
Calcium: 8.5 mg/dL — ABNORMAL LOW (ref 8.9–10.3)
Chloride: 105 mmol/L (ref 98–111)
Creatinine, Ser: 1.56 mg/dL — ABNORMAL HIGH (ref 0.61–1.24)
GFR calc Af Amer: 51 mL/min — ABNORMAL LOW (ref >60)
GFR calc non Af Amer: 44 mL/min — ABNORMAL LOW (ref >60)
Glucose, Bld: 88 mg/dL (ref 70–99)
Potassium: 4.1 mmol/L (ref 3.5–5.1)
Sodium: 137 mmol/L (ref 135–145)

## 2019-10-24 LAB — MAGNESIUM: Magnesium: 1.9 mg/dL (ref 1.7–2.4)

## 2019-10-24 MED ORDER — CARVEDILOL 6.25 MG PO TABS
6.2500 mg | ORAL_TABLET | Freq: Two times a day (BID) | ORAL | Status: DC
  Administered 2019-10-24 (×2): 6.25 mg via ORAL
  Filled 2019-10-24 (×2): qty 1

## 2019-10-24 NOTE — Progress Notes (Signed)
PROGRESS NOTE    Harry [name]  QZR:[id number] DOB: [birth year] DOA: 10/20/2019 PCP: Harry Fire, MD    Brief Narrative:  Harry [name] is a 74 y.o. male with medical history significant of chronic kidney disease stage III, hypertension, hyperlipidemia, peripheral vascular disease and peripheral neuropathy with previous syncopal episode, CVA and type 2 diabetes poorly controlled who presented to the ER with recurrent syncopal episodes.  He was seen apparently in in the ER event last week.  He passed out at that time seen in the ER and thought it was due to dehydration.  Patient was hydrated and sent home.  Since then he is passed out at least twice.  Per patient in both the time his blood pressure was low and sometimes his sugar would be low.  He is currently off antihypertensives because of hypotension.  His daughter that lives with him witnessed his passing out and noted that he was altered mentally.  He was  down on the ground and still unresponsive until EMS got there.  They gave him assisted ventilation for about 50 minutes then brought him to the ER.   He is currently fully back to his baseline in ED. Blood sugar was normal systolic blood pressure up to 180 and currently above 200 in the ER.  He has no recollection of what really happened to him.  Patient has had previous CVA.    In the ED, Temperature is 97.9 blood pressure 245/220 pulse 108 respirate of 22 oxygen sats 99% on room air.  Chemistry showed BUN 25 creatinine 1.98.  White count 5.4 hemoglobin 9.7 platelet 256.  COVID-19 negative urinalysis essentially negative A1c of 7.0 TSH 0.963.  Chest x-ray and head CT without contrast were both negative.  Patient being admitted with recurrent syncopal episode for work-up.   Assessment & Plan:   Principal Problem:   Syncope and collapse Active Problems:   Type 2 diabetes mellitus with stage 3 chronic kidney disease, with long-term current use of insulin (HCC)   Essential  hypertension   PAD (peripheral artery disease) (HCC)   Hyperlipidemia   CKD (chronic kidney disease), stage III   Dementia without behavioral disturbance (HCC)  Recurrent syncope The ED with recurrent syncopal episode.  Extremely poorly controlled hypertension, not on any medication regimen at home.  On presentation his systolic blood pressure ranging 180-250.  Daughter denied any seizure-like activity at home.  Patient is afebrile without leukocytosis.  TSH within normal limits.  CT head without acute intracranial findings, but notes multiple chronic microhemorrhages consistent with hypertensive angiopathy.  Chest x-ray no acute cardiopulmonary disease process. TTE with LVEF 60-65%, no LV regional wall motion abnormalities, grade 1 diastolic dysfunction, mildly dilated left atrial size, no aortic stenosis.  Etiology likely secondary to orthostatic hypotension. --Continue to monitor on telemetry --Follow orthostatic vital signs daily --Continue PT/OT  --Supportive care  Hypertensive urgency Patient's BP severely elevated on admission with systolics 354-562.  Patient reportedly off antihypertensives for "hypotension". --BP 175/89 this morning --Continue amlodipine 10 mg p.o. daily --Start carvedilol 6.25 mg p.o. twice daily --Hydralazine 25 mg p.o. every 6 hours as needed for SBP greater than 170 --Continue monitor blood pressure closely  Orthostatic hypotension Continues with orthostasis despite IV fluid hydration.  Etiology likely multifactorial given his underlying dementia, autonomic dysfunction with poor baroreceptor response versus medication with SSRI.  May be very difficult to control as it is likely from his underlying dementia and autonomic dysfunction. --Discontinued Zoloft --Repeat orthostatic vital signs daily --  Encourage increased oral intake  Hypomagnesemia Magnesium level 1.9, today --Monitor electrolytes closely daily  Type 2 diabetes mellitus Hemoglobin A1C 7.0, well  controlled.  On Lantus 30 units subcutaneously nightly at home. --Lantus 5 units subcutaneously nightly. --Insulin/scale for further coverage --CBGs before every meal/at bedtime  Peripheral artery disease --Continue Plavix 75 mg p.o. daily and aspirin 81 mg p.o. daily  Hyperlipidemia:  Crestor 5 mg p.o. nightly  Depression: Discontinue Zoloft today as SSRIs have side effect profile causing orthostasis  Hx dementia Patient's daughter reports that patient diagnosed with dementia several years ago by a neurologist.  She is concerned about his ability to function independently at this time. --Delirium precautions --Get up during the day --Encourage a familiar face to remain present throughout the day --Keep blinds open and lights on during daylight hours --Minimize the use of opioids/benzodiazepines   DVT prophylaxis: Lovenox Code Status: Full code Family Communication: None present at bedside, updated patient's daughter Harry [name] via telephone yesterday evening  Disposition Plan:  Status is: Inpatient  Remains inpatient appropriate because:Altered mental status, Ongoing diagnostic testing needed not appropriate for outpatient work up, Unsafe d/c plan and IV treatments appropriate due to intensity of illness or inability to take PO   Dispo: The patient is from: Home              Anticipated d/c is to: Home              Anticipated d/c date is: 2 days              Patient currently is not medically stable to d/c.  Consultants:   None  Procedures:  TTE 10/21/2019: IMPRESSIONS  1. Left ventricular ejection fraction, by estimation, is 60 to 65%. The  left ventricle has normal function. The left ventricle has no regional  wall motion abnormalities. There is mild left ventricular hypertrophy.  Left ventricular diastolic parameters  are consistent with Grade I diastolic dysfunction (impaired relaxation).  2. Right ventricular systolic function is normal. The right ventricular    size is normal.  3. Left atrial size was mildly dilated.  4. The mitral valve is normal in structure. Trivial mitral valve  regurgitation. No evidence of mitral stenosis.  5. Calcified non coronary cusp . The aortic valve is normal in structure.  Aortic valve regurgitation is not visualized. Mild to moderate aortic  valve sclerosis/calcification is present, without any evidence of aortic  stenosis.  6. The inferior vena cava is normal in size with greater than 50%  respiratory variability, suggesting right atrial pressure of 3 mmHg.  Antimicrobials:   None   Subjective: Patient seen and examined bedside, resting comfortably.  Continues with weakness and persistent orthostasis.  Otherwise no other complaints.  Updated patient's daughter Harry [name] via telephone this afternoon. Denies headache, no visual changes, no chest pain, palpitations, no shortness of breath, no abdominal pain.  No acute events overnight per nursing staff.  Objective: Vitals:   10/23/19 0810 10/23/19 1613 10/23/19 2145 10/24/19 0831  BP: (!) 189/91 (!) 163/85 (!) 175/89 (!) 193/88  Pulse: 85 81 84 84  Resp: 20 20 18 20   Temp: 97.8 F (36.6 C) 98.3 F (36.8 C) 98.2 F (36.8 C) (!) 97.3 F (36.3 C)  TempSrc:  Oral Oral Oral  SpO2: 98% 99% 100% 100%  Weight:        Intake/Output Summary (Last 24 hours) at 10/24/2019 1112 Last data filed at 10/24/2019 0900 Gross per 24 hour  Intake --  Output  2025 ml  Net -2025 ml   Filed Weights   10/23/19 0542 10/23/19 0623  Weight: 85.3 kg 85.4 kg    Examination:  General exam: Appears calm and comfortable, elderly in appearance, pleasantly confused Respiratory system: Clear to auscultation. Respiratory effort normal. Cardiovascular system: S1 & S2 heard, RRR. No JVD, murmurs, rubs, gallops or clicks. No pedal edema. Gastrointestinal system: Abdomen is nondistended, soft and nontender. No organomegaly or masses felt. Normal bowel sounds heard. Central nervous  system: Alert and oriented. No focal neurological deficits. Extremities: Symmetric 5 x 5 power. Skin: No rashes, lesions or ulcers Psychiatry: Judgement and insight appear poor. Mood & affect appropriate.     Data Reviewed: I have personally reviewed following labs and imaging studies  CBC: Recent Labs  Lab 10/20/19 1527 10/20/19 2047 10/21/19 0415  WBC 5.4 5.7 6.2  NEUTROABS 3.2  --   --   HGB 9.7* 9.3* 8.9*  HCT 32.6* 31.6* 29.2*  MCV 87.2 87.1 87.2  PLT 256 248 062   Basic Metabolic Panel: Recent Labs  Lab 10/20/19 1527 10/20/19 1527 10/20/19 2047 10/21/19 0415 10/22/19 0126 10/23/19 0115 10/24/19 0118  NA 143  --   --  139 138 135 137  K 3.7  --   --  3.3* 4.0 4.2 4.1  CL 107  --   --  106 106 105 105  CO2 27  --   --  24 24 25 24   GLUCOSE 72  --   --  139* 92 145* 88  BUN 25*  --   --  24* 23 25* 26*  CREATININE 1.98*   < > 1.75* 1.66* 1.53* 1.60* 1.56*  CALCIUM 8.9  --   --  8.4* 8.3* 8.3* 8.5*  MG  --   --   --   --  1.4* 2.0 1.9   < > = values in this interval not displayed.   GFR: Estimated Creatinine Clearance: 47 mL/min (A) (by C-G formula based on SCr of 1.56 mg/dL (H)). Liver Function Tests: Recent Labs  Lab 10/20/19 1527 10/21/19 0415  AST 20 17  ALT 19 16  ALKPHOS 59 49  BILITOT 0.9 0.8  PROT 7.4 6.4*  ALBUMIN 3.7 3.0*   No results for input(s): LIPASE, AMYLASE in the last 168 hours. No results for input(s): AMMONIA in the last 168 hours. Coagulation Profile: No results for input(s): INR, PROTIME in the last 168 hours. Cardiac Enzymes: No results for input(s): CKTOTAL, CKMB, CKMBINDEX, TROPONINI in the last 168 hours. BNP (last 3 results) No results for input(s): PROBNP in the last 8760 hours. HbA1C: No results for input(s): HGBA1C in the last 72 hours. CBG: Recent Labs  Lab 10/23/19 1152 10/23/19 1513 10/23/19 2134 10/24/19 0833 10/24/19 0858  GLUCAP 116* 168* 121* 64* 83   Lipid Profile: No results for input(s): CHOL, HDL,  LDLCALC, TRIG, CHOLHDL, LDLDIRECT in the last 72 hours. Thyroid Function Tests: No results for input(s): TSH, T4TOTAL, FREET4, T3FREE, THYROIDAB in the last 72 hours. Anemia Panel: No results for input(s): VITAMINB12, FOLATE, FERRITIN, TIBC, IRON, RETICCTPCT in the last 72 hours. Sepsis Labs: No results for input(s): PROCALCITON, LATICACIDVEN in the last 168 hours.  Recent Results (from the past 240 hour(s))  SARS Coronavirus 2 by RT PCR (hospital order, performed in [facility] hospital lab) Nasopharyngeal Nasopharyngeal Swab     Status: None   Collection Time: 10/20/19  4:17 PM   Specimen: Nasopharyngeal Swab  Result Value Ref Range Status  SARS Coronavirus 2 NEGATIVE NEGATIVE Final    Comment: (NOTE) SARS-CoV-2 target nucleic acids are NOT DETECTED.  The SARS-CoV-2 RNA is generally detectable in upper and lower respiratory specimens during the acute phase of infection. The lowest concentration of SARS-CoV-2 viral copies this assay can detect is 250 copies / mL. A negative result does not preclude SARS-CoV-2 infection and should not be used as the sole basis for treatment or other patient management decisions.  A negative result may occur with improper specimen collection / handling, submission of specimen other than nasopharyngeal swab, presence of viral mutation(s) within the areas targeted by this assay, and inadequate number of viral copies (<250 copies / mL). A negative result must be combined with clinical observations, patient history, and epidemiological information.  Fact Sheet for Patients:   StrictlyIdeas.no  Fact Sheet for Healthcare Providers: BankingDealers.co.za  This test is not yet approved or  cleared by the [place] FDA and has been authorized for detection and/or diagnosis of SARS-CoV-2 by FDA under an Emergency Use Authorization (EUA).  This EUA will remain in effect (meaning this test can be used) for  the duration of the COVID-19 declaration under Section 564(b)(1) of the Act, 21 U.S.C. section 360bbb-3(b)(1), unless the authorization is terminated or revoked sooner.  Performed at [facility], [place] [address] [place], [place] [address]          Radiology Studies: No results found.      Scheduled Meds: . amLODipine  10 mg Oral Daily  . aspirin EC  81 mg Oral QPM  . carvedilol  6.25 mg Oral BID WC  . clopidogrel  75 mg Oral Daily  . enoxaparin (LOVENOX) injection  40 mg Subcutaneous Q24H  . feeding supplement (ENSURE ENLIVE)  237 mL Oral BID BM  . insulin aspart  0-5 Units Subcutaneous QHS  . insulin aspart  0-9 Units Subcutaneous TID WC  . insulin glargine  5 Units Subcutaneous QHS  . rosuvastatin  5 mg Oral QPM  . sodium chloride flush  3 mL Intravenous Q12H   Continuous Infusions:    LOS: 3 days    Time spent: 35 minutes spent on chart review, discussion with nursing staff, consultants, updating family and interview/physical exam; more than 50% of that time was spent in counseling and/or coordination of care.    Hind Chesler J [place] DO [facility] Available via Epic secure chat 7am-7pm After these hours, please refer to coverage provider listed on [email or web address] 10/24/2019, 11:12 AM

## 2019-10-24 NOTE — Plan of Care (Signed)

## 2019-10-25 LAB — GLUCOSE, CAPILLARY
Glucose-Capillary: 66 mg/dL — ABNORMAL LOW (ref 70–99)
Glucose-Capillary: 141 mg/dL — ABNORMAL HIGH (ref 70–99)
Glucose-Capillary: 162 mg/dL — ABNORMAL HIGH (ref 70–99)
Glucose-Capillary: 150 mg/dL — ABNORMAL HIGH (ref 70–99)
Glucose-Capillary: 215 mg/dL — ABNORMAL HIGH (ref 70–99)

## 2019-10-25 MED ORDER — CARVEDILOL 12.5 MG PO TABS
12.5000 mg | ORAL_TABLET | Freq: Two times a day (BID) | ORAL | Status: DC
  Administered 2019-10-25 (×2): 12.5 mg via ORAL
  Filled 2019-10-25 (×2): qty 1

## 2019-10-25 MED ORDER — INSULIN GLARGINE 100 UNIT/ML ~~LOC~~ SOLN
8.0000 [IU] | Freq: Every day | SUBCUTANEOUS | Status: DC
  Administered 2019-10-25 – 2019-10-28 (×4): 8 [IU] via SUBCUTANEOUS
  Filled 2019-10-25 (×5): qty 0.08

## 2019-10-25 NOTE — Progress Notes (Signed)
PROGRESS NOTE    Harry [name]  PZW:[id number] DOB: April 19, 1945 DOA: 10/20/2019 PCP: [name], MD    Brief Narrative:  Harry [name] is a 74 y.o. male with medical history significant of chronic kidney disease stage III, hypertension, hyperlipidemia, peripheral vascular disease and peripheral neuropathy with previous syncopal episode, CVA and type 2 diabetes poorly controlled who presented to the ER with recurrent syncopal episodes.  He was seen apparently in in the ER event last week.  He passed out at that time seen in the ER and thought it was due to dehydration.  Patient was hydrated and sent home.  Since then he is passed out at least twice.  Per patient in both the time his blood pressure was low and sometimes his sugar would be low.  He is currently off antihypertensives because of hypotension.  His daughter that lives with him witnessed his passing out and noted that he was altered mentally.  He was  down on the ground and still unresponsive until EMS got there.  They gave him assisted ventilation for about 50 minutes then brought him to the ER.   He is currently fully back to his baseline in ED. Blood sugar was normal systolic blood pressure up to 180 and currently above 200 in the ER.  He has no recollection of what really happened to him.  Patient has had previous CVA.    In the ED, Temperature is 97.9 blood pressure 245/220 pulse 108 respirate of 22 oxygen sats 99% on room air.  Chemistry showed BUN 25 creatinine 1.98.  White count 5.4 hemoglobin 9.7 platelet 256.  COVID-19 negative urinalysis essentially negative A1c of 7.0 TSH 0.963.  Chest x-ray and head CT without contrast were both negative.  Patient being admitted with recurrent syncopal episode for work-up.   Assessment & Plan:   Principal Problem:   Syncope and collapse Active Problems:   Type 2 diabetes mellitus with stage 3 chronic kidney disease, with long-term current use of insulin (HCC)   Essential  hypertension   PAD (peripheral artery disease) (HCC)   Hyperlipidemia   CKD (chronic kidney disease), stage III   Dementia without behavioral disturbance (HCC)  Recurrent syncope The ED with recurrent syncopal episode.  Extremely poorly controlled hypertension, not on any medication regimen at home.  On presentation his systolic blood pressure ranging 180-250.  Daughter denied any seizure-like activity at home.  Patient is afebrile without leukocytosis.  TSH within normal limits.  CT head without acute intracranial findings, but notes multiple chronic microhemorrhages consistent with hypertensive angiopathy.  Chest x-ray no acute cardiopulmonary disease process. TTE with LVEF 60-65%, no LV regional wall motion abnormalities, grade 1 diastolic dysfunction, mildly dilated left atrial size, no aortic stenosis.  Etiology likely secondary to orthostatic hypotension. --Continue to monitor on telemetry --Follow orthostatic vital signs daily --Continue PT/OT  --Supportive care  Hypertensive urgency Patient's BP severely elevated on admission with systolics 423-536.  Patient reportedly off antihypertensives for "hypotension". --BP 173/99 >> improved to 138/71 --Continue amlodipine 10 mg p.o. daily --Increased carvedilol to 12.5 mg p.o. twice daily --Hydralazine 25 mg p.o. every 6 hours as needed for SBP greater than 170 --Continue monitor blood pressure closely  Orthostatic hypotension Continues with orthostasis despite IV fluid hydration.  Etiology likely multifactorial given his underlying dementia, autonomic dysfunction with poor baroreceptor response versus medication with SSRI.  May be very difficult to control as it is likely from his underlying dementia and autonomic dysfunction. --Discontinued Zoloft --Follow orthostatic  vital signs daily --Encourage increased oral intake  Hypomagnesemia Magnesium level 1.9, today --Monitor electrolytes closely daily  Type 2 diabetes  mellitus Hemoglobin A1C 7.0, well controlled.  On Lantus 30 units subcutaneously nightly at home. --Lantus 8 units subcutaneously nightly. --Insulin/scale for further coverage --CBGs before every meal/at bedtime  Peripheral artery disease --Continue Plavix 75 mg p.o. daily and aspirin 81 mg p.o. daily  Hyperlipidemia:  Crestor 5 mg p.o. nightly  Depression: Discontinue Zoloft today as SSRIs have side effect profile causing orthostasis  Hx dementia Patient's daughter reports that patient diagnosed with dementia several years ago by a neurologist.  She is concerned about his ability to function independently at this time. --Delirium precautions --Get up during the day --Encourage a familiar face to remain present throughout the day --Keep blinds open and lights on during daylight hours --Minimize the use of opioids/benzodiazepines   DVT prophylaxis: Lovenox Code Status: Full code Family Communication: None present at bedside, updated patient's daughter [name] via telephone yesterday evening  Disposition Plan:  Status is: Inpatient  Remains inpatient appropriate because:Altered mental status, Ongoing diagnostic testing needed not appropriate for outpatient work up, Unsafe d/c plan and IV treatments appropriate due to intensity of illness or inability to take PO   Dispo: The patient is from: Home              Anticipated d/c is to: SNF [facility]              Anticipated d/c date is: 1 day              Patient currently is medically stable to d/c.  Consultants:   None  Procedures:  TTE 10/21/2019: IMPRESSIONS  1. Left ventricular ejection fraction, by estimation, is 60 to 65%. The  left ventricle has normal function. The left ventricle has no regional  wall motion abnormalities. There is mild left ventricular hypertrophy.  Left ventricular diastolic parameters  are consistent with Grade I diastolic dysfunction (impaired relaxation).  2. Right ventricular  systolic function is normal. The right ventricular  size is normal.  3. Left atrial size was mildly dilated.  4. The mitral valve is normal in structure. Trivial mitral valve  regurgitation. No evidence of mitral stenosis.  5. Calcified non coronary cusp . The aortic valve is normal in structure.  Aortic valve regurgitation is not visualized. Mild to moderate aortic  valve sclerosis/calcification is present, without any evidence of aortic  stenosis.  6. The inferior vena cava is normal in size with greater than 50%  respiratory variability, suggesting right atrial pressure of 3 mmHg.  Antimicrobials:   None   Subjective: Patient seen and examined bedside, resting comfortably.  No complaints this morning.  Denies headache, no dizziness, no chest pain, palpitations, no shortness of breath, no abdominal pain.  No acute events overnight per nursing staff.  Objective: Vitals:   10/24/19 1657 10/24/19 2011 10/25/19 0448 10/25/19 0752  BP:  (!) 156/78 (!) 173/99 138/71  Pulse:  79 79 80  Resp:  18 18 18   Temp: 98.4 F (36.9 C) 97.6 F (36.4 C) 98.6 F (37 C) 97.9 F (36.6 C)  TempSrc: Oral Oral Oral Oral  SpO2:  97% 100% 98%  Weight:        Intake/Output Summary (Last 24 hours) at 10/25/2019 1119 Last data filed at 10/25/2019 0600 Gross per 24 hour  Intake --  Output 1425 ml  Net -1425 ml   Filed Weights   10/23/19 0542  10/23/19 0623  Weight: 85.3 kg 85.4 kg    Examination:  General exam: Appears calm and comfortable, elderly in appearance, pleasantly confused Respiratory system: Clear to auscultation. Respiratory effort normal. Cardiovascular system: S1 & S2 heard, RRR. No JVD, murmurs, rubs, gallops or clicks. No pedal edema. Gastrointestinal system: Abdomen is nondistended, soft and nontender. No organomegaly or masses felt. Normal bowel sounds heard. Central nervous system: Alert and oriented. No focal neurological deficits. Extremities: Symmetric 5 x 5  power. Skin: No rashes, lesions or ulcers Psychiatry: Judgement and insight appear poor. Mood & affect appropriate.     Data Reviewed: I have personally reviewed following labs and imaging studies  CBC: Recent Labs  Lab 10/20/19 1527 10/20/19 2047 10/21/19 0415  WBC 5.4 5.7 6.2  NEUTROABS 3.2  --   --   HGB 9.7* 9.3* 8.9*  HCT 32.6* 31.6* 29.2*  MCV 87.2 87.1 87.2  PLT 256 248 606   Basic Metabolic Panel: Recent Labs  Lab 10/20/19 1527 10/20/19 1527 10/20/19 2047 10/21/19 0415 10/22/19 0126 10/23/19 0115 10/24/19 0118  NA 143  --   --  139 138 135 137  K 3.7  --   --  3.3* 4.0 4.2 4.1  CL 107  --   --  106 106 105 105  CO2 27  --   --  24 24 25 24   GLUCOSE 72  --   --  139* 92 145* 88  BUN 25*  --   --  24* 23 25* 26*  CREATININE 1.98*   < > 1.75* 1.66* 1.53* 1.60* 1.56*  CALCIUM 8.9  --   --  8.4* 8.3* 8.3* 8.5*  MG  --   --   --   --  1.4* 2.0 1.9   < > = values in this interval not displayed.   GFR: Estimated Creatinine Clearance: 47 mL/min (A) (by C-G formula based on SCr of 1.56 mg/dL (H)). Liver Function Tests: Recent Labs  Lab 10/20/19 1527 10/21/19 0415  AST 20 17  ALT 19 16  ALKPHOS 59 49  BILITOT 0.9 0.8  PROT 7.4 6.4*  ALBUMIN 3.7 3.0*   No results for input(s): LIPASE, AMYLASE in the last 168 hours. No results for input(s): AMMONIA in the last 168 hours. Coagulation Profile: No results for input(s): INR, PROTIME in the last 168 hours. Cardiac Enzymes: No results for input(s): CKTOTAL, CKMB, CKMBINDEX, TROPONINI in the last 168 hours. BNP (last 3 results) No results for input(s): PROBNP in the last 8760 hours. HbA1C: No results for input(s): HGBA1C in the last 72 hours. CBG: Recent Labs  Lab 10/24/19 1302 10/24/19 1626 10/24/19 2139 10/25/19 0754 10/25/19 0953  GLUCAP 173* 206* 268* 66* 141*   Lipid Profile: No results for input(s): CHOL, HDL, LDLCALC, TRIG, CHOLHDL, LDLDIRECT in the last 72 hours. Thyroid Function Tests: No  results for input(s): TSH, T4TOTAL, FREET4, T3FREE, THYROIDAB in the last 72 hours. Anemia Panel: No results for input(s): VITAMINB12, FOLATE, FERRITIN, TIBC, IRON, RETICCTPCT in the last 72 hours. Sepsis Labs: No results for input(s): PROCALCITON, LATICACIDVEN in the last 168 hours.  Recent Results (from the past 240 hour(s))  SARS Coronavirus 2 by RT PCR (hospital order, performed in [facility] hospital lab) Nasopharyngeal Nasopharyngeal Swab     Status: None   Collection Time: 10/20/19  4:17 PM   Specimen: Nasopharyngeal Swab  Result Value Ref Range Status   SARS Coronavirus 2 NEGATIVE NEGATIVE Final    Comment: (NOTE) SARS-CoV-2 target nucleic  acids are NOT DETECTED.  The SARS-CoV-2 RNA is generally detectable in upper and lower respiratory specimens during the acute phase of infection. The lowest concentration of SARS-CoV-2 viral copies this assay can detect is 250 copies / mL. A negative result does not preclude SARS-CoV-2 infection and should not be used as the sole basis for treatment or other patient management decisions.  A negative result may occur with improper specimen collection / handling, submission of specimen other than nasopharyngeal swab, presence of viral mutation(s) within the areas targeted by this assay, and inadequate number of viral copies (<250 copies / mL). A negative result must be combined with clinical observations, patient history, and epidemiological information.  Fact Sheet for Patients:   StrictlyIdeas.no  Fact Sheet for Healthcare Providers: BankingDealers.co.za  This test is not yet approved or  cleared by the [place] FDA and has been authorized for detection and/or diagnosis of SARS-CoV-2 by FDA under an Emergency Use Authorization (EUA).  This EUA will remain in effect (meaning this test can be used) for the duration of the COVID-19 declaration under Section 564(b)(1) of the Act, 21  U.S.C. section 360bbb-3(b)(1), unless the authorization is terminated or revoked sooner.  Performed at [facility], [place] [address], [place], [place] [address]          Radiology Studies: No results found.      Scheduled Meds: . amLODipine  10 mg Oral Daily  . aspirin EC  81 mg Oral QPM  . carvedilol  12.5 mg Oral BID WC  . clopidogrel  75 mg Oral Daily  . enoxaparin (LOVENOX) injection  40 mg Subcutaneous Q24H  . feeding supplement (ENSURE ENLIVE)  237 mL Oral BID BM  . insulin aspart  0-5 Units Subcutaneous QHS  . insulin aspart  0-9 Units Subcutaneous TID WC  . insulin glargine  8 Units Subcutaneous QHS  . rosuvastatin  5 mg Oral QPM  . sodium chloride flush  3 mL Intravenous Q12H   Continuous Infusions:    LOS: 4 days    Time spent: 34 minutes spent on chart review, discussion with nursing staff, consultants, updating family and interview/physical exam; more than 50% of that time was spent in counseling and/or coordination of care.    Tallan Sandoz J [place] DO [facility] Available via Epic secure chat 7am-7pm After these hours, please refer to coverage provider listed on [email or web address] 10/25/2019, 11:19 AM

## 2019-10-26 LAB — GLUCOSE, CAPILLARY
Glucose-Capillary: 111 mg/dL — ABNORMAL HIGH (ref 70–99)
Glucose-Capillary: 77 mg/dL (ref 70–99)
Glucose-Capillary: 96 mg/dL (ref 70–99)
Glucose-Capillary: 130 mg/dL — ABNORMAL HIGH (ref 70–99)
Glucose-Capillary: 323 mg/dL — ABNORMAL HIGH (ref 70–99)
Glucose-Capillary: 192 mg/dL — ABNORMAL HIGH (ref 70–99)

## 2019-10-26 LAB — SARS CORONAVIRUS 2 BY RT PCR (HOSPITAL ORDER, PERFORMED IN ~~LOC~~ HOSPITAL LAB): SARS Coronavirus 2: NEGATIVE

## 2019-10-26 MED ORDER — AMLODIPINE BESYLATE 5 MG PO TABS
5.0000 mg | ORAL_TABLET | Freq: Every day | ORAL | Status: DC
  Administered 2019-10-27: 5 mg via ORAL
  Filled 2019-10-26: qty 1

## 2019-10-26 MED ORDER — CARVEDILOL 25 MG PO TABS
25.0000 mg | ORAL_TABLET | Freq: Two times a day (BID) | ORAL | Status: DC
  Administered 2019-10-26 – 2019-10-30 (×9): 25 mg via ORAL
  Filled 2019-10-26 (×9): qty 1

## 2019-10-26 NOTE — TOC Progression Note (Signed)
Transition of Care [facility]) - Progression Note    Patient Details  Name: Harry [name] MRN: [id number] Date of Birth: 15-Oct-1945  Transition of Care [facility]) CM/SW Contact  Joanne Chars, LCSW Phone Number: 10/26/2019, 9:55 AM  Clinical Narrative:   CSW spoke with Juliann Pulse at [facility], who is ready for pt transfer.  Discharge delayed today per MD.  Covid ordered yesterday.  Juliann Pulse informed.     Expected Discharge Plan: Skilled Nursing Facility Barriers to Discharge: Continued Medical Work up  Expected Discharge Plan and Services Expected Discharge Plan: [place]                                               Social Determinants of Health (SDOH) Interventions    Readmission Risk Interventions Readmission Risk Prevention Plan 09/02/2018 08/14/2018  Transportation Screening Complete Complete  PCP or Specialist Appt within 3-5 Days - Complete  HRI or Home Care Consult - Not Complete  HRI or Home Care Consult comments - pt discharging to SNF  Social Work Consult for [place] Planning/Counseling - Not Complete  SW consult not completed comments - no needs  Palliative Care Screening - Not Applicable  Medication Review (RN Care Manager) - Referral to Pharmacy  PCP or Specialist appointment within 3-5 days of discharge Complete -  SW Recovery Care/Counseling Consult Complete -  Palliative Care Screening Not Applicable -  Skilled Nursing Facility Complete -  Some recent data might be hidden

## 2019-10-26 NOTE — Plan of Care (Signed)

## 2019-10-26 NOTE — Progress Notes (Signed)
PT Cancellation Note  Patient Details Name: Harry [name] MRN: [id number] DOB: [birth year]   Cancelled Treatment:    Reason Eval/Treat Not Completed: Medical issues which prohibited therapy per RN, patient not doing well medically this morning, requests PT hold for now. Will continue to follow acutely and attempt return if time/schedule allow.    Windell Norfolk, DPT, PN1   Supplemental Physical Therapist [facility]    Pager [phone] Acute Rehab Office [phone]

## 2019-10-26 NOTE — Significant Event (Signed)
Rapid Response Event Note   Reason for Call :  Pt up to chair eating breakfast. Found to be slumped over in chair, not responding to staff.  Initial Focused Assessment:  Pt in the chair, leaning towards the left. Pt snoring, no twitching or shaking noted. Face symmetrical. CBG was 77 this morning, now 96. Pt right pupil round, brisk reaction to light. Pt left eye is opaque- chronic. Pt initially will not follow commands, but does moan to pain. Skin is warm, dry. Lung sounds are clear. No adventitious heart sounds.  VS: BP 71/45, HR 78, RR 18, SpO2 99% on room air  IVF bolus started. Pt slowly returning to baseline. He is disoriented to place, time, and situation-has dementia at baseline. Grips are equal. Pt is able to move all extremities and follows commands. Smile is symmetrical. Dr. [place] at bedside to evaluate pt. Pt assisted back to bed.  VS: T 97.36F, BP 90/72, HR 81, RR 16, SpO2 100% on room air  Interventions:  -1L IVF bolus  Plan of Care:  -PIV x2 -Reevaluate mentation and obtain full set of VS following IVF bolus -Stand-by assistance during meals  Call rapid response for additional needs.   Event Summary:  MD Notified: Dr. [place] Call Time: [phone] Arrival Time: 6295 End Time: [place], RN

## 2019-10-26 NOTE — Progress Notes (Signed)
PROGRESS NOTE    Harry [name]  VZC:[id number] DOB: 1945/12/08 DOA: 10/20/2019 PCP: Harry Fire, MD    Brief Narrative:  Harry [name] is a 74 y.o. male with medical history significant of chronic kidney disease stage III, hypertension, hyperlipidemia, peripheral vascular disease and peripheral neuropathy with previous syncopal episode, CVA and type 2 diabetes poorly controlled who presented to the ER with recurrent syncopal episodes.  He was seen apparently in in the ER event last week.  He passed out at that time seen in the ER and thought it was due to dehydration.  Patient was hydrated and sent home.  Since then he is passed out at least twice.  Per patient in both the time his blood pressure was low and sometimes his sugar would be low.  He is currently off antihypertensives because of hypotension.  His daughter that lives with him witnessed his passing out and noted that he was altered mentally.  He was  down on the ground and still unresponsive until EMS got there.  They gave him assisted ventilation for about 50 minutes then brought him to the ER.   He is currently fully back to his baseline in ED. Blood sugar was normal systolic blood pressure up to 180 and currently above 200 in the ER.  He has no recollection of what really happened to him.  Patient has had previous CVA.    In the ED, Temperature is 97.9 blood pressure 245/220 pulse 108 respirate of 22 oxygen sats 99% on room air.  Chemistry showed BUN 25 creatinine 1.98.  White count 5.4 hemoglobin 9.7 platelet 256.  COVID-19 negative urinalysis essentially negative A1c of 7.0 TSH 0.963.  Chest x-ray and head CT without contrast were both negative.  Patient being admitted with recurrent syncopal episode for work-up.   Assessment & Plan:   Principal Problem:   Syncope and collapse Active Problems:   Type 2 diabetes mellitus with stage 3 chronic kidney disease, with long-term current use of insulin (HCC)   Essential  hypertension   PAD (peripheral artery disease) (HCC)   Hyperlipidemia   CKD (chronic kidney disease), stage III   Dementia without behavioral disturbance (HCC)  Recurrent syncope The ED with recurrent syncopal episode.  Extremely poorly controlled hypertension, not on any medication regimen at home.  On presentation his systolic blood pressure ranging 180-250.  Daughter denied any seizure-like activity at home.  Patient is afebrile without leukocytosis.  TSH within normal limits.  CT head without acute intracranial findings, but notes multiple chronic microhemorrhages consistent with hypertensive angiopathy.  Chest x-ray no acute cardiopulmonary disease process. TTE with LVEF 60-65%, no LV regional wall motion abnormalities, grade 1 diastolic dysfunction, mildly dilated left atrial size, no aortic stenosis.  Etiology likely secondary to orthostatic hypotension. --Continue to monitor on telemetry --Follow orthostatic vital signs daily --Continue PT/OT  --Supportive care  Hypertensive urgency Patient's BP severely elevated on admission with systolics 128-786.  Patient reportedly off antihypertensives for "hypotension". --BP 173/99 >> improved to 111/57 this am --Decrease amlodipine 5 mg p.o. daily --Increased carvedilol to 25 mg p.o. twice daily --Continue monitor blood pressure closely  Orthostatic hypotension Continues with orthostasis despite IV fluid hydration.  Etiology likely multifactorial given his underlying dementia, autonomic dysfunction with poor baroreceptor response versus medication with SSRI.  May be very difficult to control as it is likely from his underlying dementia and autonomic dysfunction. --Discontinued Zoloft --Follow orthostatic vital signs daily --Encourage increased oral intake --Unlikely to adhere to TED  hose/binder  Hypomagnesemia Magnesium level 1.9, today --Monitor electrolytes closely daily  Type 2 diabetes mellitus Hemoglobin A1C 7.0, well controlled.   On Lantus 30 units subcutaneously nightly at home. --Lantus 8 units subcutaneously nightly. --Insulin/scale for further coverage --CBGs before every meal/at bedtime  Peripheral artery disease --Continue Plavix 75 mg p.o. daily and aspirin 81 mg p.o. daily  Hyperlipidemia:  Crestor 5 mg p.o. nightly  Depression: Discontinued Zoloft as SSRIs have side effect profile causing orthostasis  Hx dementia Patient's daughter reports that patient diagnosed with dementia several years ago by a neurologist.  She is concerned about his ability to function independently at this time. --Delirium precautions --Get up during the day --Encourage a familiar face to remain present throughout the day --Keep blinds open and lights on during daylight hours --Minimize the use of opioids/benzodiazepines   DVT prophylaxis: Lovenox Code Status: Full code Family Communication: None present at bedside, updated patient's daughter Harry [name] via telephone yesterday evening  Disposition Plan:  Status is: Inpatient  Remains inpatient appropriate because:Altered mental status, Ongoing diagnostic testing needed not appropriate for outpatient work up, Unsafe d/c plan and IV treatments appropriate due to intensity of illness or inability to take PO   Dispo: The patient is from: Home              Anticipated d/c is to: [facility]              Anticipated d/c date is: 1 day              Patient currently is medically stable to d/c.  Consultants:   None  Procedures:  TTE 10/21/2019: IMPRESSIONS  1. Left ventricular ejection fraction, by estimation, is 60 to 65%. The  left ventricle has normal function. The left ventricle has no regional  wall motion abnormalities. There is mild left ventricular hypertrophy.  Left ventricular diastolic parameters  are consistent with Grade I diastolic dysfunction (impaired relaxation).  2. Right ventricular systolic function is normal. The right ventricular    size is normal.  3. Left atrial size was mildly dilated.  4. The mitral valve is normal in structure. Trivial mitral valve  regurgitation. No evidence of mitral stenosis.  5. Calcified non coronary cusp . The aortic valve is normal in structure.  Aortic valve regurgitation is not visualized. Mild to moderate aortic  valve sclerosis/calcification is present, without any evidence of aortic  stenosis.  6. The inferior vena cava is normal in size with greater than 50%  respiratory variability, suggesting right atrial pressure of 3 mmHg.  Antimicrobials:   None   Subjective: Patient seen and examined bedside, resting comfortably.  Eating breakfast this morning and drinking coffee.  No complaints this morning.  Denies headache, no dizziness, no chest pain, palpitations, no shortness of breath, no abdominal pain.  No acute events overnight per nursing staff.  Called back to bedside by nursing staff for rapid response.  Patient was found to be poorly responsive slumped over in chair responsive.  Blood sugar 96, blood pressure low at 71/45 with normal heart rate 78 and SPO2 99% on room air.  Patient was started on IV fluid bolus.  Review of telemetry shows no significant arrhythmic event during episode.  No signs of seizure activity.  Patient returned to the bed.  Reevaluated patient later this afternoon at bedside, patient completed his lunch, he is alert and oriented x3.  Updated patient's daughter Harry [name] on the unit; she is concerned about possible Sjogren's  syndrome; given extensive family history.  Objective: Vitals:   10/26/19 0906 10/26/19 0921 10/26/19 1055 10/26/19 1241  BP: (!) 78/58 90/72 (!) 165/81 (!) 150/75  Pulse: 75 81  88  Resp: 16 16    Temp:      TempSrc:      SpO2: 100% 100%  100%  Weight:        Intake/Output Summary (Last 24 hours) at 10/26/2019 1451 Last data filed at 10/26/2019 0351 Gross per 24 hour  Intake --  Output 250 ml  Net -250 ml   Filed Weights    10/23/19 0623 10/25/19 2254 10/26/19 0244  Weight: 85.4 kg 85.3 kg 85.3 kg    Examination:  General exam: Appears calm and comfortable, elderly in appearance, pleasantly confused but oriented to person/place/time Respiratory system: Clear to auscultation. Respiratory effort normal. Cardiovascular system: S1 & S2 heard, RRR. No JVD, murmurs, rubs, gallops or clicks. No pedal edema. Gastrointestinal system: Abdomen is nondistended, soft and nontender. No organomegaly or masses felt. Normal bowel sounds heard. Central nervous system: Alert and oriented to person/place/time. No focal neurological deficits. Extremities: Symmetric 5 x 5 power. Skin: No rashes, lesions or ulcers Psychiatry: Judgement and insight appear poor. Mood & affect appropriate.     Data Reviewed: I have personally reviewed following labs and imaging studies  CBC: Recent Labs  Lab 10/20/19 1527 10/20/19 2047 10/21/19 0415  WBC 5.4 5.7 6.2  NEUTROABS 3.2  --   --   HGB 9.7* 9.3* 8.9*  HCT 32.6* 31.6* 29.2*  MCV 87.2 87.1 87.2  PLT 256 248 595   Basic Metabolic Panel: Recent Labs  Lab 10/20/19 1527 10/20/19 1527 10/20/19 2047 10/21/19 0415 10/22/19 0126 10/23/19 0115 10/24/19 0118  NA 143  --   --  139 138 135 137  K 3.7  --   --  3.3* 4.0 4.2 4.1  CL 107  --   --  106 106 105 105  CO2 27  --   --  24 24 25 24   GLUCOSE 72  --   --  139* 92 145* 88  BUN 25*  --   --  24* 23 25* 26*  CREATININE 1.98*   < > 1.75* 1.66* 1.53* 1.60* 1.56*  CALCIUM 8.9  --   --  8.4* 8.3* 8.3* 8.5*  MG  --   --   --   --  1.4* 2.0 1.9   < > = values in this interval not displayed.   GFR: Estimated Creatinine Clearance: 47 mL/min (A) (by C-G formula based on SCr of 1.56 mg/dL (H)). Liver Function Tests: Recent Labs  Lab 10/20/19 1527 10/21/19 0415  AST 20 17  ALT 19 16  ALKPHOS 59 49  BILITOT 0.9 0.8  PROT 7.4 6.4*  ALBUMIN 3.7 3.0*   No results for input(s): LIPASE, AMYLASE in the last 168 hours. No  results for input(s): AMMONIA in the last 168 hours. Coagulation Profile: No results for input(s): INR, PROTIME in the last 168 hours. Cardiac Enzymes: No results for input(s): CKTOTAL, CKMB, CKMBINDEX, TROPONINI in the last 168 hours. BNP (last 3 results) No results for input(s): PROBNP in the last 8760 hours. HbA1C: No results for input(s): HGBA1C in the last 72 hours. CBG: Recent Labs  Lab 10/25/19 2049 10/26/19 0502 10/26/19 0758 10/26/19 0848 10/26/19 1157  GLUCAP 215* 111* 77 96 130*   Lipid Profile: No results for input(s): CHOL, HDL, LDLCALC, TRIG, CHOLHDL, LDLDIRECT in the last 72 hours. Thyroid  Function Tests: No results for input(s): TSH, T4TOTAL, FREET4, T3FREE, THYROIDAB in the last 72 hours. Anemia Panel: No results for input(s): VITAMINB12, FOLATE, FERRITIN, TIBC, IRON, RETICCTPCT in the last 72 hours. Sepsis Labs: No results for input(s): PROCALCITON, LATICACIDVEN in the last 168 hours.  Recent Results (from the past 240 hour(s))  SARS Coronavirus 2 by RT PCR (hospital order, performed in [facility] hospital lab) Nasopharyngeal Nasopharyngeal Swab     Status: None   Collection Time: 10/20/19  4:17 PM   Specimen: Nasopharyngeal Swab  Result Value Ref Range Status   SARS Coronavirus 2 NEGATIVE NEGATIVE Final    Comment: (NOTE) SARS-CoV-2 target nucleic acids are NOT DETECTED.  The SARS-CoV-2 RNA is generally detectable in upper and lower respiratory specimens during the acute phase of infection. The lowest concentration of SARS-CoV-2 viral copies this assay can detect is 250 copies / mL. A negative result does not preclude SARS-CoV-2 infection and should not be used as the sole basis for treatment or other patient management decisions.  A negative result may occur with improper specimen collection / handling, submission of specimen other than nasopharyngeal swab, presence of viral mutation(s) within the areas targeted by this assay, and inadequate number of  viral copies (<250 copies / mL). A negative result must be combined with clinical observations, patient history, and epidemiological information.  Fact Sheet for Patients:   StrictlyIdeas.no  Fact Sheet for Healthcare Providers: BankingDealers.co.za  This test is not yet approved or  cleared by the [place] FDA and has been authorized for detection and/or diagnosis of SARS-CoV-2 by FDA under an Emergency Use Authorization (EUA).  This EUA will remain in effect (meaning this test can be used) for the duration of the COVID-19 declaration under Section 564(b)(1) of the Act, 21 U.S.C. section 360bbb-3(b)(1), unless the authorization is terminated or revoked sooner.  Performed at [facility], [place] [address], [place], [place] [address]   SARS Coronavirus 2 by RT PCR (hospital order, performed in [facility] hospital lab) Nasopharyngeal Nasopharyngeal Swab     Status: None   Collection Time: 10/25/19  9:31 AM   Specimen: Nasopharyngeal Swab  Result Value Ref Range Status   SARS Coronavirus 2 NEGATIVE NEGATIVE Final    Comment: (NOTE) SARS-CoV-2 target nucleic acids are NOT DETECTED.  The SARS-CoV-2 RNA is generally detectable in upper and lower respiratory specimens during the acute phase of infection. The lowest concentration of SARS-CoV-2 viral copies this assay can detect is 250 copies / mL. A negative result does not preclude SARS-CoV-2 infection and should not be used as the sole basis for treatment or other patient management decisions.  A negative result may occur with improper specimen collection / handling, submission of specimen other than nasopharyngeal swab, presence of viral mutation(s) within the areas targeted by this assay, and inadequate number of viral copies (<250 copies / mL). A negative result must be combined with clinical observations, patient history, and epidemiological information.  Fact Sheet for  Patients:   StrictlyIdeas.no  Fact Sheet for Healthcare Providers: BankingDealers.co.za  This test is not yet approved or  cleared by the [place] FDA and has been authorized for detection and/or diagnosis of SARS-CoV-2 by FDA under an Emergency Use Authorization (EUA).  This EUA will remain in effect (meaning this test can be used) for the duration of the COVID-19 declaration under Section 564(b)(1) of the Act, 21 U.S.C. section 360bbb-3(b)(1), unless the authorization is terminated or revoked sooner.  Performed at [facility]  H[facility] Lab, [place] [address] [place], [place] [address]          Radiology Studies: No results found.      Scheduled Meds: . amLODipine  5 mg Oral Daily  . aspirin EC  81 mg Oral QPM  . carvedilol  25 mg Oral BID WC  . clopidogrel  75 mg Oral Daily  . enoxaparin (LOVENOX) injection  40 mg Subcutaneous Q24H  . feeding supplement (ENSURE ENLIVE)  237 mL Oral BID BM  . insulin aspart  0-5 Units Subcutaneous QHS  . insulin aspart  0-9 Units Subcutaneous TID WC  . insulin glargine  8 Units Subcutaneous QHS  . rosuvastatin  5 mg Oral QPM  . sodium chloride flush  3 mL Intravenous Q12H   Continuous Infusions:    LOS: 5 days    Time spent: 39 minutes spent on chart review, discussion with nursing staff, consultants, updating family and interview/physical exam; more than 50% of that time was spent in counseling and/or coordination of care.    Delesa Kawa J [place] DO [facility] Available via Epic secure chat 7am-7pm After these hours, please refer to coverage provider listed on [email or web address] 10/26/2019, 2:51 PM

## 2019-10-27 ENCOUNTER — Inpatient Hospital Stay (HOSPITAL_COMMUNITY): Payer: Medicare Other

## 2019-10-27 DIAGNOSIS — R569 Unspecified convulsions: Secondary | ICD-10-CM

## 2019-10-27 DIAGNOSIS — R55 Syncope and collapse: Secondary | ICD-10-CM

## 2019-10-27 LAB — CBC
HCT: 28.2 % — ABNORMAL LOW (ref 39.0–52.0)
Hemoglobin: 8.7 g/dL — ABNORMAL LOW (ref 13.0–17.0)
MCH: 27.1 pg (ref 26.0–34.0)
MCHC: 30.9 g/dL (ref 30.0–36.0)
MCV: 87.9 fL (ref 80.0–100.0)
Platelets: 232 10*3/uL (ref 150–400)
RBC: 3.21 MIL/uL — ABNORMAL LOW (ref 4.22–5.81)
RDW: 19.4 % — ABNORMAL HIGH (ref 11.5–15.5)
WBC: 4.7 10*3/uL (ref 4.0–10.5)
nRBC: 0.6 % — ABNORMAL HIGH (ref 0.0–0.2)

## 2019-10-27 LAB — BASIC METABOLIC PANEL
Anion gap: 8 (ref 5–15)
BUN: 25 mg/dL — ABNORMAL HIGH (ref 8–23)
CO2: 24 mmol/L (ref 22–32)
Calcium: 8.4 mg/dL — ABNORMAL LOW (ref 8.9–10.3)
Chloride: 105 mmol/L (ref 98–111)
Creatinine, Ser: 1.59 mg/dL — ABNORMAL HIGH (ref 0.61–1.24)
GFR calc Af Amer: 50 mL/min — ABNORMAL LOW (ref >60)
GFR calc non Af Amer: 43 mL/min — ABNORMAL LOW (ref >60)
Glucose, Bld: 172 mg/dL — ABNORMAL HIGH (ref 70–99)
Potassium: 4.4 mmol/L (ref 3.5–5.1)
Sodium: 137 mmol/L (ref 135–145)

## 2019-10-27 LAB — GLUCOSE, CAPILLARY
Glucose-Capillary: 96 mg/dL (ref 70–99)
Glucose-Capillary: 155 mg/dL — ABNORMAL HIGH (ref 70–99)
Glucose-Capillary: 179 mg/dL — ABNORMAL HIGH (ref 70–99)
Glucose-Capillary: 264 mg/dL — ABNORMAL HIGH (ref 70–99)

## 2019-10-27 LAB — MAGNESIUM: Magnesium: 1.7 mg/dL (ref 1.7–2.4)

## 2019-10-27 MED ORDER — AMLODIPINE BESYLATE 5 MG PO TABS
5.0000 mg | ORAL_TABLET | Freq: Every day | ORAL | 0 refills | Status: DC
Start: 2019-10-28 — End: 2019-10-27

## 2019-10-27 MED ORDER — MAGNESIUM SULFATE 2 GM/50ML IV SOLN
2.0000 g | Freq: Once | INTRAVENOUS | Status: AC
  Administered 2019-10-27: 2 g via INTRAVENOUS
  Filled 2019-10-27: qty 50

## 2019-10-27 MED ORDER — ROSUVASTATIN CALCIUM 5 MG PO TABS
5.0000 mg | ORAL_TABLET | Freq: Every evening | ORAL | 0 refills | Status: DC
Start: 2019-10-27 — End: 2022-09-06

## 2019-10-27 MED ORDER — INSULIN GLARGINE 100 UNIT/ML ~~LOC~~ SOLN: 8.0000 [IU] | Freq: Every day | SUBCUTANEOUS | 0 refills | Status: DC

## 2019-10-27 MED ORDER — CLOPIDOGREL BISULFATE 75 MG PO TABS: 75.0000 mg | ORAL_TABLET | Freq: Every day | ORAL | 0 refills | Status: AC

## 2019-10-27 MED ORDER — ASPIRIN 81 MG PO TBEC
81.0000 mg | DELAYED_RELEASE_TABLET | Freq: Every evening | ORAL | 0 refills | Status: DC
Start: 2019-10-27 — End: 2022-09-17

## 2019-10-27 MED ORDER — CARVEDILOL 25 MG PO TABS
25.0000 mg | ORAL_TABLET | Freq: Two times a day (BID) | ORAL | 0 refills | Status: DC
Start: 2019-10-27 — End: 2020-06-07

## 2019-10-27 NOTE — Progress Notes (Signed)
Spoke with patient's daughter on phone requesting updates on patient condition.  Pt confused this am, ate breakfast in chair, vital signs stable.  Daughter voiced concern about patient not having called her recently and patient condition, RN called daughter from patient's room phone.  Daughter called RN stating pt was feeling weird. RN assessed patient & repeated vitals. VSS and patient reported that he felt weird and foggy.  When asked patient was able to tell RN where he was, the year, and his name.  Disoriented to situation at times.  Spoke with MD about patient's feelings and findings.

## 2019-10-27 NOTE — Care Management Important Message (Signed)
Important Message  Patient Details  Name: Harry [name] MRN: [id number] Date of Birth: Jul 31, 1945   Medicare Important Message Given:  Yes     Joanne Chars, LCSW 10/27/2019, 10:51 AM

## 2019-10-27 NOTE — Plan of Care (Signed)
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
## 2019-10-27 NOTE — Discharge Summary (Deleted)
Physician Discharge Summary  Harry [name]:[id number] DOB: 03/08/1945 DOA: 10/20/2019  PCP: [name], MD  Admit date: 10/20/2019 Discharge date: 10/27/2019  Admitted From: Home Disposition: [facility]  Recommendations for Outpatient Follow-up:  1. Follow up with PCP in 1-2 weeks 2. Continue monitor blood pressure/orthostatic vital signs closely 3. Recommend outpatient palliative care referral  Discharge Condition: Stable CODE STATUS: Full code Diet recommendation: Heart healthy/consistent carbohydrate diet  History of present illness:  Harry [name] is a 74 y.o.malewith medical history significant ofchronic kidney disease stage III, hypertension, hyperlipidemia, peripheral vascular disease and peripheral neuropathy with previous syncopal episode, CVA and type 2 diabetes poorly controlled who presented to the ER with recurrent syncopal episodes. He was seen apparently in in the ER event last week. He passed out at that time seen in the ER and thought it was due to dehydration. Patient was hydrated and sent home. Since then he is passed out at least twice. Per patient in both the time his blood pressure was low and sometimes his sugar would be low. He is currently off antihypertensives because of hypotension. His daughter that lives with him witnessed his passing out and noted that he was altered mentally. He was  down on the ground and still unresponsive until EMS got there. They gave him assisted ventilation for about 50 minutes then brought him to the ER.   He is currently fully back to his baseline in ED. Blood sugar was normal systolic blood pressure up to 180 and currently above 200 in the ER. He has no recollection of what really happened to him. Patient has had previous CVA.   In the ED, Temperature is 97.9 blood pressure 245/220 pulse 108 respirate of 22 oxygen sats 99% on room air. Chemistry showed BUN 25 creatinine 1.98. White count  5.4 hemoglobin 9.7 platelet 256. COVID-19 negative urinalysis essentially negative A1c of 7.0 TSH 0.963.Chest x-ray and head CT without contrast were both negative. Patient being admitted with recurrent syncopal episode for work-up.  Hospital course:  Recurrent syncope The ED with recurrent syncopal episode.  Extremely poorly controlled hypertension, not on any medication regimen at home.  On presentation his systolic blood pressure ranging 180-250.  Daughter denied any seizure-like activity at home.  Patient is afebrile without leukocytosis.  TSH within normal limits.  CT head without acute intracranial findings, but notes multiple chronic microhemorrhages consistent with hypertensive angiopathy.  Chest x-ray no acute cardiopulmonary disease process. TTE with LVEF 60-65%, no LV regional wall motion abnormalities, grade 1 diastolic dysfunction, mildly dilated left atrial size, no aortic stenosis.  Etiology likely secondary to orthostatic hypotension.  Recommend close monitoring of vital signs following discharge.  Consider TED hose and binder will during times of ambulation, but patient likely will not be compliant.  Continue therapy efforts outpatient.  Hypertensive urgency Patient's BP severely elevated on admission with systolics 258-527.  Patient reportedly off antihypertensives for "hypotension".  Patient started on carvedilol titrate up to 25 mg p.o. twice daily and amlodipine 5 mg p.o. daily with better control of his hypertension.  Continue to monitor blood pressure closely, if needs reduced dose, would recommend decrease dosing of amlodipine as calcium channel blockers have higher predilection to orthostasis.  Orthostatic hypotension Continues with orthostasis despite IV fluid hydration.  Etiology likely multifactorial given his underlying dementia, autonomic dysfunction with poor baroreceptor response versus medication with SSRI.  May be very difficult to control as it is likely from his  underlying dementia and  autonomic dysfunction. Discontinued Zoloft.  Continue to monitor orthostatic vital signs outpatient. Unlikely to adhere to TED hose/binder due to underlying dementia.  Hypomagnesemia Repleted during hospitalization.  Type 2 diabetes mellitus Hemoglobin A1C 7.0, well controlled.  On Lantus 30 units subcutaneously nightly at home.  On discharge will reduce dose of Lantus to 8 units subcutaneously daily.  Continue to monitor glucose and adjust as needed.  Peripheral artery disease Continue Plavix 75 mg p.o. daily and aspirin 81 mg p.o. daily  Hyperlipidemia:  Crestor 5 mg p.o. nightly  Depression: Discontinued Zoloft as SSRIs have side effect profile causing orthostasis  Hx dementia Patient's daughter reports that patient diagnosed with dementia several years ago by a neurologist.  She is concerned about his ability to function independently at this time.  During hospitalization he had waxing and waning mental status likely exacerbated by prolonged hospitalization with acute on chronic delirium from his underlying dementia and poor sleep.  Delirium precautions, get up during the day, keep blinds open and lights on during daylight hours, minimize the use of opioids/benzodiazepines.  Recommend outpatient palliative care to follow as this is a progressive process.  Discharge Diagnoses:  Principal Problem:   Syncope and collapse Active Problems:   Type 2 diabetes mellitus with stage 3 chronic kidney disease, with long-term current use of insulin (HCC)   Essential hypertension   PAD (peripheral artery disease) (HCC)   Hyperlipidemia   CKD (chronic kidney disease), stage III   Dementia without behavioral disturbance [facility])    Discharge Instructions  Discharge Instructions    Call MD for:  difficulty breathing, headache or visual disturbances   Complete by: As directed    Call MD for:  persistant nausea and vomiting   Complete by: As directed    Call MD for:   severe uncontrolled pain   Complete by: As directed    Call MD for:  temperature >100.4   Complete by: As directed    Diet - low sodium heart healthy   Complete by: As directed    Increase activity slowly   Complete by: As directed      Allergies as of 10/27/2019      Reactions   Lipitor [atorvastatin] Other (See Comments)   Myalgia   Statins Other (See Comments)   Myalgia (CAN tolerate Crestor, however)   Pravachol [pravastatin] Rash      Medication List    STOP taking these medications   acetaminophen 500 MG tablet Commonly known as: TYLENOL   Basaglar KwikPen 100 UNIT/ML Replaced by: insulin glargine 100 UNIT/ML injection   cephALEXin 500 MG capsule Commonly known as: KEFLEX   cyanocobalamin 1000 MCG tablet   pyridostigmine 60 MG tablet Commonly known as: MESTINON   sertraline 25 MG tablet Commonly known as: ZOLOFT     TAKE these medications   amLODipine 5 MG tablet Commonly known as: NORVASC Take 1 tablet (5 mg total) by mouth daily. Start taking on: October 28, 2019   Artificial Tears 1.4 % ophthalmic solution Generic drug: polyvinyl alcohol Place 1 drop into both eyes as needed for dry eyes.   aspirin 81 MG EC tablet Take 1 tablet (81 mg total) by mouth every evening.   carvedilol 25 MG tablet Commonly known as: COREG Take 1 tablet (25 mg total) by mouth 2 (two) times daily with a meal.   clopidogrel 75 MG tablet Commonly known as: PLAVIX Take 1 tablet (75 mg total) by mouth daily.   insulin aspart 100 UNIT/ML injection Commonly  known as: novoLOG Inject 0-9 Units into the skin 3 (three) times daily with meals. CBG 121 - 150: 1 unit CBG 151 - 200: 2 units CBG 201 - 250: 3 units CBG 251 - 300: 5 units CBG 301 - 350: 7 units CBG 351 - 400 9 units CBG > 400 call MD and obtain STAT lab verification   insulin glargine 100 UNIT/ML injection Commonly known as: LANTUS Inject 0.08 mLs (8 Units total) into the skin at bedtime. Replaces: Basaglar  KwikPen 100 UNIT/ML   rosuvastatin 5 MG tablet Commonly known as: CRESTOR Take 1 tablet (5 mg total) by mouth every evening.       Allergies  Allergen Reactions  . Lipitor [Atorvastatin] Other (See Comments)    Myalgia   . Statins Other (See Comments)    Myalgia (CAN tolerate Crestor, however)  . Pravachol [Pravastatin] Rash    Consultations:  none   Procedures/Studies: CT Head Wo Contrast  Result Date: 10/20/2019 CLINICAL DATA:  Dizziness, loss of consciousness EXAM: CT HEAD WITHOUT CONTRAST TECHNIQUE: Contiguous axial images were obtained from the base of the skull through the vertex without intravenous contrast. COMPARISON:  10/12/2019 FINDINGS: Brain: Stable areas of encephalomalacia are seen within the cerebellum, left occipital lobe, bilateral parietal lobes consistent with prior ischemic change. Chronic small vessel ischemic changes are again noted throughout the periventricular white matter, bilateral basal ganglia, bilateral thalami. No evidence of acute infarct or hemorrhage. Lateral ventricles and remaining midline structures are unremarkable. No acute extra-axial fluid collections. No mass effect. Vascular: Stable atherosclerosis.  No hyperdense vessel. Skull: Normal. Negative for fracture or focal lesion. Sinuses/Orbits: No acute finding. Other: None. IMPRESSION: 1. Stable chronic ischemic changes.  No acute intracranial process. Electronically Signed   By: Randa Ngo M.D.   On: 10/20/2019 16:03   CT HEAD WO CONTRAST  Result Date: 10/12/2019 CLINICAL DATA:  Recurrence syncope EXAM: CT HEAD WITHOUT CONTRAST TECHNIQUE: Contiguous axial images were obtained from the base of the skull through the vertex without intravenous contrast. COMPARISON:  08/31/2018 FINDINGS: Brain: Encephalomalacia again noted within the a cerebellar hemispheres bilaterally, left greater than right, as well as the occipital lobes bilaterally, left greater than right, most in keeping with remote  infarct. This appears stable since prior examination. Mild parenchymal volume loss is commensurate with the patient's age. Moderate periventricular white matter changes are present likely reflecting the sequela of small vessel ischemia. Bilateral remote thalamic infarcts are noted. No abnormal intra or extra-axial mass lesion or fluid collection. No abnormal mass effect or midline shift. No evidence of acute intracranial hemorrhage or infarct. Ventricular size is normal. Cerebellum unremarkable. Vascular: Moderate atherosclerotic calcification noted within the carotid siphons. No hyperdense asymmetric vasculature at the skull base. Skull: Intact Sinuses/Orbits: Paranasal sinuses are clear. Orbits are unremarkable. Other: Mastoid air cells and middle ear cavities are clear. IMPRESSION: No evidence of acute intracranial hemorrhage or infarct. Stable remote infarcts. Stable senescent changes. Electronically Signed   By: Fidela Salisbury MD   On: 10/12/2019 18:21   MR BRAIN WO CONTRAST  Result Date: 10/21/2019 CLINICAL DATA:  Recurrent syncope EXAM: MRI HEAD WITHOUT CONTRAST TECHNIQUE: Multiplanar, multiecho pulse sequences of the brain and surrounding structures were obtained without intravenous contrast. COMPARISON:  08/12/2018 FINDINGS: Brain: No acute infarct, acute hemorrhage or extra-axial collection. There are multiple old infarcts, including both occipital lobes in both cerebellar hemispheres. There is multifocal periventricular white matter hyperintensity, most often a result of chronic microvascular ischemia. There is generalized atrophy without lobar  predilection. Multiple chronic microhemorrhages in a predominantly central distribution. Normal midline structures. Vascular: Normal flow voids. Skull and upper cervical spine: Normal marrow signal. Sinuses/Orbits: Negative. Other: None. IMPRESSION: 1. No acute intracranial abnormality. 2. Multiple old infarcts, including both occipital lobes and both  cerebellar hemispheres. 3. Multiple chronic microhemorrhages in a predominantly central distribution, most consistent with hypertensive angiopathy. Electronically Signed   By: Ulyses Jarred M.D.   On: 10/21/2019 01:32   DG Chest Port 1 View  Result Date: 10/20/2019 CLINICAL DATA:  Altered level of consciousness, hypotension EXAM: PORTABLE CHEST 1 VIEW COMPARISON:  04/12/2019 FINDINGS: The heart size and mediastinal contours are within normal limits. Both lungs are clear. The visualized skeletal structures are unremarkable. IMPRESSION: No active disease. Electronically Signed   By: Randa Ngo M.D.   On: 10/20/2019 15:33   ECHOCARDIOGRAM COMPLETE  Result Date: 10/21/2019    ECHOCARDIOGRAM REPORT   Patient Name:   Harry [name] Date of Exam: 10/21/2019 Medical Rec #:  [id number]        Height:       72.0 in Accession #:    [id number]       Weight:       175.0 lb Date of Birth:  [birth year]         BSA:          2.013 m Patient Age:    73 years         BP:           161/83 mmHg Patient Gender: M                HR:           80 bpm. Exam Location:  Inpatient Procedure: 2D Echo, Cardiac Doppler and Color Doppler Indications:    Syncope  History:        Patient has prior history of Echocardiogram examinations, most                 recent 08/12/2018. Stroke, Signs/Symptoms:Altered Mental Status                 and Syncope; Risk Factors:Hypertension, Diabetes, Dyslipidemia                 and Former Smoker. CKD.  Sonographer:    Dustin Flock Referring Phys: [place]  1. Left ventricular ejection fraction, by estimation, is 60 to 65%. The left ventricle has normal function. The left ventricle has no regional wall motion abnormalities. There is mild left ventricular hypertrophy. Left ventricular diastolic parameters are consistent with Grade I diastolic dysfunction (impaired relaxation).  2. Right ventricular systolic function is normal. The right ventricular size is normal.  3. Left  atrial size was mildly dilated.  4. The mitral valve is normal in structure. Trivial mitral valve regurgitation. No evidence of mitral stenosis.  5. Calcified non coronary cusp . The aortic valve is normal in structure. Aortic valve regurgitation is not visualized. Mild to moderate aortic valve sclerosis/calcification is present, without any evidence of aortic stenosis.  6. The inferior vena cava is normal in size with greater than 50% respiratory variability, suggesting right atrial pressure of 3 mmHg. FINDINGS  Left Ventricle: Left ventricular ejection fraction, by estimation, is 60 to 65%. The left ventricle has normal function. The left ventricle has no regional wall motion abnormalities. The left ventricular internal cavity size was normal in size. There is  mild left ventricular hypertrophy. Left ventricular diastolic parameters are  consistent with Grade I diastolic dysfunction (impaired relaxation). Right Ventricle: The right ventricular size is normal. No increase in right ventricular wall thickness. Right ventricular systolic function is normal. Left Atrium: Left atrial size was mildly dilated. Right Atrium: Right atrial size was normal in size. Pericardium: There is no evidence of pericardial effusion. Mitral Valve: The mitral valve is normal in structure. Normal mobility of the mitral valve leaflets. Trivial mitral valve regurgitation. No evidence of mitral valve stenosis. Tricuspid Valve: The tricuspid valve is normal in structure. Tricuspid valve regurgitation is not demonstrated. No evidence of tricuspid stenosis. Aortic Valve: Calcified non coronary cusp. The aortic valve is normal in structure. Aortic valve regurgitation is not visualized. Mild to moderate aortic valve sclerosis/calcification is present, without any evidence of aortic stenosis. Pulmonic Valve: The pulmonic valve was normal in structure. Pulmonic valve regurgitation is not visualized. No evidence of pulmonic stenosis. Aorta: The  aortic root is normal in size and structure. Venous: The inferior vena cava is normal in size with greater than 50% respiratory variability, suggesting right atrial pressure of 3 mmHg. IAS/Shunts: No atrial level shunt detected by color flow Doppler.  LEFT VENTRICLE PLAX 2D LVIDd:         4.50 cm  Diastology LVIDs:         2.80 cm  LV e' lateral:   4.90 cm/s LV PW:         1.30 cm  LV E/e' lateral: 12.2 LV IVS:        1.40 cm  LV e' medial:    3.37 cm/s LVOT diam:     2.40 cm  LV E/e' medial:  17.8 LV SV:         63 LV SV Index:   31 LVOT Area:     4.52 cm  RIGHT VENTRICLE RV S prime:     7.72 cm/s TAPSE (M-mode): 2.4 cm LEFT ATRIUM             Index LA diam:        4.00 cm 1.99 cm/m LA Vol (A2C):   49.8 ml 24.74 ml/m LA Vol (A4C):   34.5 ml 17.14 ml/m LA Biplane Vol: 42.5 ml 21.11 ml/m  AORTIC VALVE LVOT Vmax:   84.90 cm/s LVOT Vmean:  57.000 cm/s LVOT VTI:    0.140 m  AORTA Ao Root diam: 3.60 cm MITRAL VALVE MV Area (PHT): 3.85 cm     SHUNTS MV Decel Time: 197 msec     Systemic VTI:  0.14 m MV E velocity: 60.00 cm/s   Systemic Diam: 2.40 cm MV A velocity: 101.00 cm/s MV E/A ratio:  0.59 Jenkins Rouge MD Electronically signed by Jenkins Rouge MD Signature Date/Time: 10/21/2019/10:05:04 AM    Final       Subjective: Patient seen and examined at bedside this morning, resting comfortably.  Eating breakfast.  Discharge Exam: Vitals:   10/27/19 0735 10/27/19 1045  BP: 135/66 102/64  Pulse: 71 72  Resp: 19 19  Temp: 98.2 F (36.8 C)   SpO2: 100% 97%   Vitals:   10/26/19 1626 10/26/19 2001 10/27/19 0735 10/27/19 1045  BP: (!) 161/81 (!) 169/71 135/66 102/64  Pulse: 88 82 71 72  Resp: 20 18 19 19   Temp: (!) 97.4 F (36.3 C) 97.8 F (36.6 C) 98.2 F (36.8 C)   TempSrc:  Oral Oral   SpO2: 100% 100% 100% 97%  Weight:        General: Pt is alert, awake, not in  acute distress Cardiovascular: RRR, S1/S2 +, no rubs, no gallops Respiratory: CTA bilaterally, no wheezing, no rhonchi Abdominal:  Soft, NT, ND, bowel sounds + Extremities: no edema, no cyanosis    The results of significant diagnostics from this hospitalization (including imaging, microbiology, ancillary and laboratory) are listed below for reference.     Microbiology: Recent Results (from the past 240 hour(s))  SARS Coronavirus 2 by RT PCR (hospital order, performed in [facility] hospital lab) Nasopharyngeal Nasopharyngeal Swab     Status: None   Collection Time: 10/20/19  4:17 PM   Specimen: Nasopharyngeal Swab  Result Value Ref Range Status   SARS Coronavirus 2 NEGATIVE NEGATIVE Final    Comment: (NOTE) SARS-CoV-2 target nucleic acids are NOT DETECTED.  The SARS-CoV-2 RNA is generally detectable in upper and lower respiratory specimens during the acute phase of infection. The lowest concentration of SARS-CoV-2 viral copies this assay can detect is 250 copies / mL. A negative result does not preclude SARS-CoV-2 infection and should not be used as the sole basis for treatment or other patient management decisions.  A negative result may occur with improper specimen collection / handling, submission of specimen other than nasopharyngeal swab, presence of viral mutation(s) within the areas targeted by this assay, and inadequate number of viral copies (<250 copies / mL). A negative result must be combined with clinical observations, patient history, and epidemiological information.  Fact Sheet for Patients:   StrictlyIdeas.no  Fact Sheet for Healthcare Providers: BankingDealers.co.za  This test is not yet approved or  cleared by the [place] FDA and has been authorized for detection and/or diagnosis of SARS-CoV-2 by FDA under an Emergency Use Authorization (EUA).  This EUA will remain in effect (meaning this test can be used) for the duration of the COVID-19 declaration under Section 564(b)(1) of the Act, 21 U.S.C. section 360bbb-3(b)(1), unless the  authorization is terminated or revoked sooner.  Performed at [facility], [place] [address], [place], [place] [address]   SARS Coronavirus 2 by RT PCR (hospital order, performed in [facility] hospital lab) Nasopharyngeal Nasopharyngeal Swab     Status: None   Collection Time: 10/25/19  9:31 AM   Specimen: Nasopharyngeal Swab  Result Value Ref Range Status   SARS Coronavirus 2 NEGATIVE NEGATIVE Final    Comment: (NOTE) SARS-CoV-2 target nucleic acids are NOT DETECTED.  The SARS-CoV-2 RNA is generally detectable in upper and lower respiratory specimens during the acute phase of infection. The lowest concentration of SARS-CoV-2 viral copies this assay can detect is 250 copies / mL. A negative result does not preclude SARS-CoV-2 infection and should not be used as the sole basis for treatment or other patient management decisions.  A negative result may occur with improper specimen collection / handling, submission of specimen other than nasopharyngeal swab, presence of viral mutation(s) within the areas targeted by this assay, and inadequate number of viral copies (<250 copies / mL). A negative result must be combined with clinical observations, patient history, and epidemiological information.  Fact Sheet for Patients:   StrictlyIdeas.no  Fact Sheet for Healthcare Providers: BankingDealers.co.za  This test is not yet approved or  cleared by the [place] FDA and has been authorized for detection and/or diagnosis of SARS-CoV-2 by FDA under an Emergency Use Authorization (EUA).  This EUA will remain in effect (meaning this test can be used) for the duration of the COVID-19 declaration under Section 564(b)(1) of the Act, 21 U.S.C. section 360bbb-3(b)(1), unless the authorization is  terminated or revoked sooner.  Performed at [facility], [place] [address] [place], [place] [address]      Labs: BNP (last 3 results) No  results for input(s): BNP in the last 8760 hours. Basic Metabolic Panel: Recent Labs  Lab 10/21/19 0415 10/22/19 0126 10/23/19 0115 10/24/19 0118 10/27/19 0201  NA 139 138 135 137 137  K 3.3* 4.0 4.2 4.1 4.4  CL 106 106 105 105 105  CO2 24 24 25 24 24   GLUCOSE 139* 92 145* 88 172*  BUN 24* 23 25* 26* 25*  CREATININE 1.66* 1.53* 1.60* 1.56* 1.59*  CALCIUM 8.4* 8.3* 8.3* 8.5* 8.4*  MG  --  1.4* 2.0 1.9 1.7   Liver Function Tests: Recent Labs  Lab 10/20/19 1527 10/21/19 0415  AST 20 17  ALT 19 16  ALKPHOS 59 49  BILITOT 0.9 0.8  PROT 7.4 6.4*  ALBUMIN 3.7 3.0*   No results for input(s): LIPASE, AMYLASE in the last 168 hours. No results for input(s): AMMONIA in the last 168 hours. CBC: Recent Labs  Lab 10/20/19 1527 10/20/19 2047 10/21/19 0415 10/27/19 0201  WBC 5.4 5.7 6.2 4.7  NEUTROABS 3.2  --   --   --   HGB 9.7* 9.3* 8.9* 8.7*  HCT 32.6* 31.6* 29.2* 28.2*  MCV 87.2 87.1 87.2 87.9  PLT 256 248 227 232   Cardiac Enzymes: No results for input(s): CKTOTAL, CKMB, CKMBINDEX, TROPONINI in the last 168 hours. BNP: Invalid input(s): POCBNP CBG: Recent Labs  Lab 10/26/19 0848 10/26/19 1157 10/26/19 1628 10/26/19 2105 10/27/19 0726  GLUCAP 96 130* 323* 192* 96   D-Dimer No results for input(s): DDIMER in the last 72 hours. Hgb A1c No results for input(s): HGBA1C in the last 72 hours. Lipid Profile No results for input(s): CHOL, HDL, LDLCALC, TRIG, CHOLHDL, LDLDIRECT in the last 72 hours. Thyroid function studies No results for input(s): TSH, T4TOTAL, T3FREE, THYROIDAB in the last 72 hours.  Invalid input(s): FREET3 Anemia work up No results for input(s): VITAMINB12, FOLATE, FERRITIN, TIBC, IRON, RETICCTPCT in the last 72 hours. Urinalysis    Component Value Date/Time   COLORURINE YELLOW 10/20/2019 1701   APPEARANCEUR CLEAR 10/20/2019 1701   LABSPEC 1.015 10/20/2019 1701   PHURINE 6.0 10/20/2019 1701   GLUCOSEU 50 (A) 10/20/2019 1701   HGBUR SMALL  (A) 10/20/2019 1701   BILIRUBINUR NEGATIVE 10/20/2019 1701   KETONESUR NEGATIVE 10/20/2019 1701   PROTEINUR NEGATIVE 10/20/2019 1701   UROBILINOGEN 0.2 10/09/2014 2112   NITRITE NEGATIVE 10/20/2019 1701   LEUKOCYTESUR NEGATIVE 10/20/2019 1701   Sepsis Labs Invalid input(s): PROCALCITONIN,  WBC,  LACTICIDVEN Microbiology Recent Results (from the past 240 hour(s))  SARS Coronavirus 2 by RT PCR (hospital order, performed in [facility] hospital lab) Nasopharyngeal Nasopharyngeal Swab     Status: None   Collection Time: 10/20/19  4:17 PM   Specimen: Nasopharyngeal Swab  Result Value Ref Range Status   SARS Coronavirus 2 NEGATIVE NEGATIVE Final    Comment: (NOTE) SARS-CoV-2 target nucleic acids are NOT DETECTED.  The SARS-CoV-2 RNA is generally detectable in upper and lower respiratory specimens during the acute phase of infection. The lowest concentration of SARS-CoV-2 viral copies this assay can detect is 250 copies / mL. A negative result does not preclude SARS-CoV-2 infection and should not be used as the sole basis for treatment or other patient management decisions.  A negative result may occur with improper specimen collection / handling, submission of specimen other than nasopharyngeal  swab, presence of viral mutation(s) within the areas targeted by this assay, and inadequate number of viral copies (<250 copies / mL). A negative result must be combined with clinical observations, patient history, and epidemiological information.  Fact Sheet for Patients:   StrictlyIdeas.no  Fact Sheet for Healthcare Providers: BankingDealers.co.za  This test is not yet approved or  cleared by the [place] FDA and has been authorized for detection and/or diagnosis of SARS-CoV-2 by FDA under an Emergency Use Authorization (EUA).  This EUA will remain in effect (meaning this test can be used) for the duration of the COVID-19 declaration  under Section 564(b)(1) of the Act, 21 U.S.C. section 360bbb-3(b)(1), unless the authorization is terminated or revoked sooner.  Performed at [facility], [place] [address], [place], [place] [address]   SARS Coronavirus 2 by RT PCR (hospital order, performed in [facility] hospital lab) Nasopharyngeal Nasopharyngeal Swab     Status: None   Collection Time: 10/25/19  9:31 AM   Specimen: Nasopharyngeal Swab  Result Value Ref Range Status   SARS Coronavirus 2 NEGATIVE NEGATIVE Final    Comment: (NOTE) SARS-CoV-2 target nucleic acids are NOT DETECTED.  The SARS-CoV-2 RNA is generally detectable in upper and lower respiratory specimens during the acute phase of infection. The lowest concentration of SARS-CoV-2 viral copies this assay can detect is 250 copies / mL. A negative result does not preclude SARS-CoV-2 infection and should not be used as the sole basis for treatment or other patient management decisions.  A negative result may occur with improper specimen collection / handling, submission of specimen other than nasopharyngeal swab, presence of viral mutation(s) within the areas targeted by this assay, and inadequate number of viral copies (<250 copies / mL). A negative result must be combined with clinical observations, patient history, and epidemiological information.  Fact Sheet for Patients:   StrictlyIdeas.no  Fact Sheet for Healthcare Providers: BankingDealers.co.za  This test is not yet approved or  cleared by the [place] FDA and has been authorized for detection and/or diagnosis of SARS-CoV-2 by FDA under an Emergency Use Authorization (EUA).  This EUA will remain in effect (meaning this test can be used) for the duration of the COVID-19 declaration under Section 564(b)(1) of the Act, 21 U.S.C. section 360bbb-3(b)(1), unless the authorization is terminated or revoked sooner.  Performed at [facility], [place] [address] [place], [place] [address]      Time coordinating discharge: Over 30 minutes  SIGNED:   Keidy Thurgood J [place] DO  [facility] 10/27/2019, 10:49 AM

## 2019-10-27 NOTE — Progress Notes (Signed)
Physical Therapy Treatment Patient Details Name: Harry [name] MRN: [id number] DOB: 1945/03/19 Today's Date: 10/27/2019    History of Present Illness Harry [name] is a 74 y.o. male with medical history significant of chronic kidney disease stage III, hypertension, hyperlipidemia, peripheral vascular disease and peripheral neuropathy with previous syncopal episode, CVA and type 2 diabetes poorly controlled who presented to the ER with recurrent syncopal episodes.    PT Comments    Pt was agreeable to getting OOB to chair, but wanted to try walking in the hall.  Emphasis on transitions to EOB, scooting symmetrically, sit to stand, standing balance, gait stability/stamina and safety in the RW   Follow Up Recommendations  Supervision/Assistance - 24 hour;SNF     Equipment Recommendations  Other (comment) (TBA, pt reports has a RW)    Recommendations for Other Services       Precautions / Restrictions Precautions Precautions: Fall Precaution Comments: orthostatic, dementia    Mobility  Bed Mobility Overal bed mobility: Needs Assistance Bed Mobility: Supine to Sit     Supine to sit: Min assist     General bed mobility comments: cues for direction, hand placement, assist to come up.  Pt scooted without assist though laboriously  Transfers Overall transfer level: Needs assistance Equipment used: Rolling walker (2 wheeled) Transfers: Sit to/from Stand Sit to Stand: Mod assist         General transfer comment: cues for hand placement.  assist for both assist and boost.  Ambulation/Gait Ambulation/Gait assistance: Min assist Gait Distance (Feet): 120 Feet Assistive device: Rolling walker (2 wheeled) Gait Pattern/deviations: Step-through pattern;Trunk flexed;Decreased stride length   Gait velocity interpretation: <1.31 ft/sec, indicative of household ambulator General Gait Details: slow, variable step lengths, flexed posture and repetitive cues for poor proximity to  the RW.  Occasional stability assist   [name]             Wheelchair Mobility    Modified Rankin (Stroke Patients Only)       Balance Overall balance assessment: Needs assistance;History of Falls   Sitting balance-Leahy Scale: Fair     Standing balance support: Bilateral upper extremity supported;During functional activity Standing balance-Leahy Scale: Poor Standing balance comment: able to attain upright posture with assist only, otherwise significantly flexed.                            Cognition Arousal/Alertness: Awake/alert Behavior During Therapy: WFL for tasks assessed/performed Overall Cognitive Status:  (NT formally, needed repetitive cuing for direction/commands)                     Current Attention Level: Sustained   Following Commands: Follows one step commands inconsistently;Follows one step commands with increased time Safety/Judgement: Decreased awareness of safety;Decreased awareness of deficits Awareness: Intellectual Problem Solving: Difficulty sequencing        Exercises      General Comments General comments (skin integrity, edema, etc.): On RA with activity, sats 100% and HR 80 bpm      Pertinent Vitals/Pain Pain Assessment: Faces Faces Pain Scale: No hurt Pain Intervention(s): Monitored during session    Home Living                      Prior Function            PT Goals (current goals can now be found in the care plan section) Acute Rehab PT Goals Patient Stated Goal:  be able to walk with a cane PT Goal Formulation: With patient Time For Goal Achievement: 10/22/19 Potential to Achieve Goals: Good Progress towards PT goals: Progressing toward goals    Frequency    Min 2X/week      PT Plan Current plan remains appropriate    Co-evaluation              AM-PAC PT "6 Clicks" Mobility   Outcome Measure  Help needed turning from your back to your side while in a flat bed without  using bedrails?: A Little Help needed moving from lying on your back to sitting on the side of a flat bed without using bedrails?: A Little Help needed moving to and from a bed to a chair (including a wheelchair)?: A Lot Help needed standing up from a chair using your arms (e.g., wheelchair or bedside chair)?: A Lot Help needed to walk in hospital room?: A Little Help needed climbing 3-5 steps with a railing? : A Lot 6 Click Score: 15    End of Session   Activity Tolerance: Patient tolerated treatment well Patient left: in chair;with call bell/phone within reach;with chair alarm set Nurse Communication: Mobility status PT Visit Diagnosis: Other abnormalities of gait and mobility (R26.89);Muscle weakness (generalized) (M62.81)     Time: 6962-9528 PT Time Calculation (min) (ACUTE ONLY): 24 min  Charges:  $Gait Training: 8-22 mins $Therapeutic Activity: 8-22 mins                     10/27/2019  Ginger Carne., PT Acute Rehabilitation Services [phone]  (pager) [phone]  (office)   Tessie Fass Aleathia Purdy 10/27/2019, 5:24 PM

## 2019-10-27 NOTE — Procedures (Signed)
Patient Name: Harry [name]  MRN: [id number]  Epilepsy Attending: Lora Havens  Referring Physician/Provider: Dr. Eric [place] Date: 10/27/2019 Duration: 23.36 mins  Patient history: 74 year old male with episodes of passing out with seizure-like activity.  EEG to evaluate for seizures.  Level of alertness: Awake,asleep  AEDs during EEG study: None  Technical aspects: This EEG study was done with scalp electrodes positioned according to the 10-20 International system of electrode placement. Electrical activity was acquired at a sampling rate of 500Hz  and reviewed with a high frequency filter of 70Hz  and a low frequency filter of 1Hz . EEG data were recorded continuously and digitally stored.   Description: The posterior dominant rhythm consists of 8Hz  activity of moderate voltage (25-35 uV) seen predominantly in posterior head regions, symmetric and reactive to eye opening and eye closing. Sleep was characterized by vertex waves, sleep spindles (12 to 14 Hz), maximal frontocentral region.  EEG showed intermittent 3 to 6 Hz theta-delta slowing in left temporal region.  Hyperventilation and photic stimulation were not performed.     ABNORMALITY -Intermittent slow, left temporal region  IMPRESSION: This study is suggestive of non specific cortical dysfunction in  left temporal region. No seizures or epileptiform discharges were seen throughout the recording.  Arpita Fentress Barbra Sarks

## 2019-10-27 NOTE — Progress Notes (Signed)
EEG complete - results pending 

## 2019-10-27 NOTE — Progress Notes (Signed)
Handoff given to incoming nurse.

## 2019-10-27 NOTE — Progress Notes (Signed)
PROGRESS NOTE    Harry [name]  OEU:[id number] DOB: March 19, 1945 DOA: 10/20/2019 PCP: [name], MD    Brief Narrative:  Harry [name] is a 74 y.o. male with medical history significant of chronic kidney disease stage III, hypertension, hyperlipidemia, peripheral vascular disease and peripheral neuropathy with previous syncopal episode, CVA and type 2 diabetes poorly controlled who presented to the ER with recurrent syncopal episodes.  He was seen apparently in in the ER event last week.  He passed out at that time seen in the ER and thought it was due to dehydration.  Patient was hydrated and sent home.  Since then he is passed out at least twice.  Per patient in both the time his blood pressure was low and sometimes his sugar would be low.  He is currently off antihypertensives because of hypotension.  His daughter that lives with him witnessed his passing out and noted that he was altered mentally.  He was  down on the ground and still unresponsive until EMS got there.  They gave him assisted ventilation for about 50 minutes then brought him to the ER.   He is currently fully back to his baseline in ED. Blood sugar was normal systolic blood pressure up to 180 and currently above 200 in the ER.  He has no recollection of what really happened to him.  Patient has had previous CVA.    In the ED, Temperature is 97.9 blood pressure 245/220 pulse 108 respirate of 22 oxygen sats 99% on room air.  Chemistry showed BUN 25 creatinine 1.98.  White count 5.4 hemoglobin 9.7 platelet 256.  COVID-19 negative urinalysis essentially negative A1c of 7.0 TSH 0.963.  Chest x-ray and head CT without contrast were both negative.  Patient being admitted with recurrent syncopal episode for work-up.   Assessment & Plan:   Principal Problem:   Syncope and collapse Active Problems:   Type 2 diabetes mellitus with stage 3 chronic kidney disease, with long-term current use of insulin (HCC)   Essential  hypertension   PAD (peripheral artery disease) (HCC)   Hyperlipidemia   CKD (chronic kidney disease), stage III   Dementia without behavioral disturbance (HCC)  Questionable seizure-like activity Called to bedside by nursing staff for patient with with was responsiveness.  Family states he has these episodes at home where he will completely pass out with some shaking-like activity.  Previous MR brain this admission unrevealing besides chronic ischemic findings.  With underlying dementia.  No previous history of seizure disorder.  During hospitalization, patient suffering form severe hypertension coupled with orthostasis.  Also with waxing and waning mentation. --Repeat MR brain without contrast --EEG --Neurology consult for evaluation and further recommendations  Recurrent syncope The ED with recurrent syncopal episode.  Extremely poorly controlled hypertension, not on any medication regimen at home.  On presentation his systolic blood pressure ranging 180-250.  Daughter denied any seizure-like activity at home.  Patient is afebrile without leukocytosis.  TSH within normal limits.  CT head without acute intracranial findings, but notes multiple chronic microhemorrhages consistent with hypertensive angiopathy.  Chest x-ray no acute cardiopulmonary disease process. TTE with LVEF 60-65%, no LV regional wall motion abnormalities, grade 1 diastolic dysfunction, mildly dilated left atrial size, no aortic stenosis.  Etiology likely secondary to orthostatic hypotension. --Continue to monitor on telemetry --Follow orthostatic vital signs daily --Continue PT/OT  --Supportive care  Hypertensive urgency Patient's BP severely elevated on admission with systolics 154-008.  Patient reportedly off antihypertensives for "hypotension". --BP  135/66 this am --Discontinue amlodipine --Continue carvedilol to 25 mg p.o. twice daily --Continue monitor blood pressure closely  Orthostatic hypotension Continues with  orthostasis despite IV fluid hydration.  Etiology likely multifactorial given his underlying dementia, autonomic dysfunction with poor baroreceptor response versus medication with SSRI.  May be very difficult to control as it is likely from his underlying dementia and autonomic dysfunction. --Discontinued Zoloft --Follow orthostatic vital signs daily --Encourage increased oral intake --Unlikely to adhere to TED hose/binder  Hypomagnesemia Magnesium level 1.9, today --Monitor electrolytes closely daily  Type 2 diabetes mellitus Hemoglobin A1C 7.0, well controlled.  On Lantus 30 units subcutaneously nightly at home. --Lantus 8 units subcutaneously nightly. --Insulin/scale for further coverage --CBGs before every meal/at bedtime  Peripheral artery disease --Continue Plavix 75 mg p.o. daily and aspirin 81 mg p.o. daily  Hyperlipidemia:  Crestor 5 mg p.o. nightly  Depression: Discontinued Zoloft as SSRIs have side effect profile causing orthostasis  Hx dementia Patient's daughter reports that patient diagnosed with dementia several years ago by a neurologist.  She is concerned about his ability to function independently at this time. --Delirium precautions --Get up during the day --Encourage a familiar face to remain present throughout the day --Keep blinds open and lights on during daylight hours --Minimize the use of opioids/benzodiazepines --Palliative care consultation for assistance with goals of care and medical decision making   DVT prophylaxis: Lovenox Code Status: Full code Family Communication: Family present at bedside this afternoon  Disposition Plan:  Status is: Inpatient  Remains inpatient appropriate because:Altered mental status, Ongoing diagnostic testing needed not appropriate for outpatient work up, Unsafe d/c plan and IV treatments appropriate due to intensity of illness or inability to take PO   Dispo: The patient is from: Home              Anticipated d/c  is to: SNF [facility]              Anticipated d/c date is: 1 day              Patient currently is medically stable to d/c.  Consultants:   None  Procedures:  TTE 10/21/2019: IMPRESSIONS  1. Left ventricular ejection fraction, by estimation, is 60 to 65%. The  left ventricle has normal function. The left ventricle has no regional  wall motion abnormalities. There is mild left ventricular hypertrophy.  Left ventricular diastolic parameters  are consistent with Grade I diastolic dysfunction (impaired relaxation).  2. Right ventricular systolic function is normal. The right ventricular  size is normal.  3. Left atrial size was mildly dilated.  4. The mitral valve is normal in structure. Trivial mitral valve  regurgitation. No evidence of mitral stenosis.  5. Calcified non coronary cusp . The aortic valve is normal in structure.  Aortic valve regurgitation is not visualized. Mild to moderate aortic  valve sclerosis/calcification is present, without any evidence of aortic  stenosis.  6. The inferior vena cava is normal in size with greater than 50%  respiratory variability, suggesting right atrial pressure of 3 mmHg.  Antimicrobials:   None   Subjective: Patient seen and examined at bedside this morning, resting comfortably eating breakfast.  No complaints.  Knows that he is at [facility], believes the year is 2001 and the president is Molli Barrows.  No other complaints at this time.  Received calls from primary nurse and social work that patient's family upset regarding discharge and will be appealing.  Upon returning to  the room later this afternoon, patient was more confused and less responsive per nurse tech while taking his vital signs.  Arrival to room, patient talking, knows he is in the hospital year 2001 and reports president is Wynetta Emery which is similar to what he was saying this morning.  Family states he has these episodes multiple times at home  in which she passes out and then is slow to respond back to his baseline.  No arrhythmias noted on telemetry.  Objective: Vitals:   10/26/19 2001 10/27/19 0735 10/27/19 1045 10/27/19 1246  BP: (!) 169/71 135/66 102/64 (!) 91/51  Pulse: 82 71 72 71  Resp: 18 19 19    Temp: 97.8 F (36.6 C) 98.2 F (36.8 C)    TempSrc: Oral Oral    SpO2: 100% 100% 97% 94%  Weight:        Intake/Output Summary (Last 24 hours) at 10/27/2019 1255 Last data filed at 10/26/2019 1900 Gross per 24 hour  Intake --  Output 950 ml  Net -950 ml   Filed Weights   10/23/19 0623 10/25/19 2254 10/26/19 0244  Weight: 85.4 kg 85.3 kg 85.3 kg    Examination:  General exam: Appears calm and comfortable, elderly in appearance, pleasantly confused Respiratory system: Clear to auscultation. Respiratory effort normal. Cardiovascular system: S1 & S2 heard, RRR. No JVD, murmurs, rubs, gallops or clicks. No pedal edema. Gastrointestinal system: Abdomen is nondistended, soft and nontender. No organomegaly or masses felt. Normal bowel sounds heard. Central nervous system: Alert, oriented to place but not time/person or situation. No focal neurological deficits. Extremities: Symmetric 5 x 5 power. Skin: No rashes, lesions or ulcers Psychiatry: Judgement and insight appear poor.  Depressed mood & flat affect appropriate.     Data Reviewed: I have personally reviewed following labs and imaging studies  CBC: Recent Labs  Lab 10/20/19 1527 10/20/19 2047 10/21/19 0415 10/27/19 0201  WBC 5.4 5.7 6.2 4.7  NEUTROABS 3.2  --   --   --   HGB 9.7* 9.3* 8.9* 8.7*  HCT 32.6* 31.6* 29.2* 28.2*  MCV 87.2 87.1 87.2 87.9  PLT 256 248 227 741   Basic Metabolic Panel: Recent Labs  Lab 10/21/19 0415 10/22/19 0126 10/23/19 0115 10/24/19 0118 10/27/19 0201  NA 139 138 135 137 137  K 3.3* 4.0 4.2 4.1 4.4  CL 106 106 105 105 105  CO2 24 24 25 24 24   GLUCOSE 139* 92 145* 88 172*  BUN 24* 23 25* 26* 25*  CREATININE 1.66*  1.53* 1.60* 1.56* 1.59*  CALCIUM 8.4* 8.3* 8.3* 8.5* 8.4*  MG  --  1.4* 2.0 1.9 1.7   GFR: Estimated Creatinine Clearance: 46.1 mL/min (A) (by C-G formula based on SCr of 1.59 mg/dL (H)). Liver Function Tests: Recent Labs  Lab 10/20/19 1527 10/21/19 0415  AST 20 17  ALT 19 16  ALKPHOS 59 49  BILITOT 0.9 0.8  PROT 7.4 6.4*  ALBUMIN 3.7 3.0*   No results for input(s): LIPASE, AMYLASE in the last 168 hours. No results for input(s): AMMONIA in the last 168 hours. Coagulation Profile: No results for input(s): INR, PROTIME in the last 168 hours. Cardiac Enzymes: No results for input(s): CKTOTAL, CKMB, CKMBINDEX, TROPONINI in the last 168 hours. BNP (last 3 results) No results for input(s): PROBNP in the last 8760 hours. HbA1C: No results for input(s): HGBA1C in the last 72 hours. CBG: Recent Labs  Lab 10/26/19 1157 10/26/19 1628 10/26/19 2105 10/27/19 0726 10/27/19 1213  GLUCAP 130*  323* 192* 96 155*   Lipid Profile: No results for input(s): CHOL, HDL, LDLCALC, TRIG, CHOLHDL, LDLDIRECT in the last 72 hours. Thyroid Function Tests: No results for input(s): TSH, T4TOTAL, FREET4, T3FREE, THYROIDAB in the last 72 hours. Anemia Panel: No results for input(s): VITAMINB12, FOLATE, FERRITIN, TIBC, IRON, RETICCTPCT in the last 72 hours. Sepsis Labs: No results for input(s): PROCALCITON, LATICACIDVEN in the last 168 hours.  Recent Results (from the past 240 hour(s))  SARS Coronavirus 2 by RT PCR (hospital order, performed in [facility] hospital lab) Nasopharyngeal Nasopharyngeal Swab     Status: None   Collection Time: 10/20/19  4:17 PM   Specimen: Nasopharyngeal Swab  Result Value Ref Range Status   SARS Coronavirus 2 NEGATIVE NEGATIVE Final    Comment: (NOTE) SARS-CoV-2 target nucleic acids are NOT DETECTED.  The SARS-CoV-2 RNA is generally detectable in upper and lower respiratory specimens during the acute phase of infection. The lowest concentration of SARS-CoV-2 viral  copies this assay can detect is 250 copies / mL. A negative result does not preclude SARS-CoV-2 infection and should not be used as the sole basis for treatment or other patient management decisions.  A negative result may occur with improper specimen collection / handling, submission of specimen other than nasopharyngeal swab, presence of viral mutation(s) within the areas targeted by this assay, and inadequate number of viral copies (<250 copies / mL). A negative result must be combined with clinical observations, patient history, and epidemiological information.  Fact Sheet for Patients:   StrictlyIdeas.no  Fact Sheet for Healthcare Providers: BankingDealers.co.za  This test is not yet approved or  cleared by the [place] FDA and has been authorized for detection and/or diagnosis of SARS-CoV-2 by FDA under an Emergency Use Authorization (EUA).  This EUA will remain in effect (meaning this test can be used) for the duration of the COVID-19 declaration under Section 564(b)(1) of the Act, 21 U.S.C. section 360bbb-3(b)(1), unless the authorization is terminated or revoked sooner.  Performed at [facility], [place] [address], [place], [place] [address]   SARS Coronavirus 2 by RT PCR (hospital order, performed in [facility] hospital lab) Nasopharyngeal Nasopharyngeal Swab     Status: None   Collection Time: 10/25/19  9:31 AM   Specimen: Nasopharyngeal Swab  Result Value Ref Range Status   SARS Coronavirus 2 NEGATIVE NEGATIVE Final    Comment: (NOTE) SARS-CoV-2 target nucleic acids are NOT DETECTED.  The SARS-CoV-2 RNA is generally detectable in upper and lower respiratory specimens during the acute phase of infection. The lowest concentration of SARS-CoV-2 viral copies this assay can detect is 250 copies / mL. A negative result does not preclude SARS-CoV-2 infection and should not be used as the sole basis for treatment or  other patient management decisions.  A negative result may occur with improper specimen collection / handling, submission of specimen other than nasopharyngeal swab, presence of viral mutation(s) within the areas targeted by this assay, and inadequate number of viral copies (<250 copies / mL). A negative result must be combined with clinical observations, patient history, and epidemiological information.  Fact Sheet for Patients:   StrictlyIdeas.no  Fact Sheet for Healthcare Providers: BankingDealers.co.za  This test is not yet approved or  cleared by the [place] FDA and has been authorized for detection and/or diagnosis of SARS-CoV-2 by FDA under an Emergency Use Authorization (EUA).  This EUA will remain in effect (meaning this test can be used) for the duration of the COVID-19  declaration under Section 564(b)(1) of the Act, 21 U.S.C. section 360bbb-3(b)(1), unless the authorization is terminated or revoked sooner.  Performed at [facility], [place] [address], [place], [place] [address]          Radiology Studies: No results found.      Scheduled Meds: . aspirin EC  81 mg Oral QPM  . carvedilol  25 mg Oral BID WC  . clopidogrel  75 mg Oral Daily  . enoxaparin (LOVENOX) injection  40 mg Subcutaneous Q24H  . feeding supplement (ENSURE ENLIVE)  237 mL Oral BID BM  . insulin aspart  0-5 Units Subcutaneous QHS  . insulin aspart  0-9 Units Subcutaneous TID WC  . insulin glargine  8 Units Subcutaneous QHS  . rosuvastatin  5 mg Oral QPM  . sodium chloride flush  3 mL Intravenous Q12H   Continuous Infusions:    LOS: 6 days    Time spent: 42 minutes spent on chart review, discussion with nursing staff, consultants, updating family and interview/physical exam; more than 50% of that time was spent in counseling and/or coordination of care.    Arley Garant J [place] DO [facility] Available via Epic secure chat  7am-7pm After these hours, please refer to coverage provider listed on [email or web address] 10/27/2019, 12:55 PM

## 2019-10-27 NOTE — TOC Progression Note (Addendum)
Transition of Care [facility]) - Progression Note    Patient Details  Name: Harry [name] MRN: [id number] Date of Birth: 1945-05-23  Transition [facility] Care [facility]) CM/SW Contact  Joanne Chars, LCSW Phone Number: 10/27/2019, 9:24 AM  Clinical Narrative:   CSW spoke to pt daughter Harry [name], who is quite concerned that pt is more confused than "he has ever been."  She is concerned about discharge today.  MD notified.  1020: Daughter informs CSW that she is appealing discharge.  Appeal process started.   1340: CSW informed discharge cancelled. 1410: [facility] care requests update, when informed discharge is delayed, they rescind bed offer. 1440: Pt daughter informed and still requesting to speak with MD. MD had attempted to call pt daughter earlier and left message.  Other SNF options texted to pt daughter.       Expected Discharge Plan: Skilled Nursing Facility Barriers to Discharge: Continued Medical Work up  Expected Discharge Plan and Services Expected Discharge Plan: [place]                                               Social Determinants of Health (SDOH) Interventions    Readmission Risk Interventions Readmission Risk Prevention Plan 09/02/2018 08/14/2018  Transportation Screening Complete Complete  PCP or Specialist Appt within 3-5 Days - Complete  HRI or Home Care Consult - Not Complete  HRI or Home Care Consult comments - pt discharging to SNF  Social Work Consult for [place] Planning/Counseling - Not Complete  SW consult not completed comments - no needs  Palliative Care Screening - Not Applicable  Medication Review (RN Care Manager) - Referral to Pharmacy  PCP or Specialist appointment within 3-5 days of discharge Complete -  SW Recovery Care/Counseling Consult Complete -  Palliative Care Screening Not Applicable -  Skilled Nursing Facility Complete -  Some recent data might be hidden

## 2019-10-27 NOTE — Consult Note (Signed)
   [facility] Inpatient Consult   10/27/2019  [facility] 10-13-1945 [id number]   [facility] [AC[facility]] Patient:   Patient screened for high risk score for unplanned readmission score and for hospitalizations to check if potential [facility] Management service needs.  Review of patient's medical record reveals patient is currently being recommended for a skilled nursing level of care for transition for rehab needs.  Primary Care Provider is [name], MD this provider is listed to provide the transition of care [TOC] for post hospital follow up.  Plan:  Continue to follow progress and disposition to assess for post hospital care management needs.    Please place a [facility] Care Management consult as appropriate and for questions contact:   Natividad Brood, RN BSN [facility] Liaison  [phone] business mobile phone Toll free office [phone]  Fax number: [phone] [email or web address]

## 2019-10-28 DIAGNOSIS — N1831 Chronic kidney disease, stage 3a: Secondary | ICD-10-CM

## 2019-10-28 DIAGNOSIS — E785 Hyperlipidemia, unspecified: Secondary | ICD-10-CM

## 2019-10-28 DIAGNOSIS — I10 Essential (primary) hypertension: Secondary | ICD-10-CM

## 2019-10-28 DIAGNOSIS — Z515 Encounter for palliative care: Secondary | ICD-10-CM

## 2019-10-28 DIAGNOSIS — Z7189 Other specified counseling: Secondary | ICD-10-CM

## 2019-10-28 LAB — GLUCOSE, CAPILLARY
Glucose-Capillary: 200 mg/dL — ABNORMAL HIGH (ref 70–99)
Glucose-Capillary: 164 mg/dL — ABNORMAL HIGH (ref 70–99)
Glucose-Capillary: 241 mg/dL — ABNORMAL HIGH (ref 70–99)
Glucose-Capillary: 165 mg/dL — ABNORMAL HIGH (ref 70–99)
Glucose-Capillary: 261 mg/dL — ABNORMAL HIGH (ref 70–99)

## 2019-10-28 MED ORDER — PYRIDOSTIGMINE BROMIDE 60 MG PO TABS
30.0000 mg | ORAL_TABLET | Freq: Three times a day (TID) | ORAL | Status: DC
  Administered 2019-10-28 – 2019-10-30 (×7): 30 mg via ORAL
  Filled 2019-10-28 (×7): qty 1

## 2019-10-28 MED ORDER — ACETAMINOPHEN 500 MG PO TABS: 500.0000 mg | ORAL_TABLET | Freq: Four times a day (QID) | ORAL | Status: DC | PRN
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
## 2019-10-28 NOTE — Progress Notes (Signed)
PROGRESS NOTE    Harry [name]  GMW:[id number] DOB: 1945/08/08 DOA: 10/20/2019 PCP: [name], MD    Chief Complaint  Patient presents with  . Loss of Consciousness    Brief Narrative:  74 year old male with prior history of stage IIIa CKD, essential hypertension, hyperlipidemia, peripheral vascular disease, peripheral neuropathy, CVA, type 2 diabetes mellitus, presents to ED with recurrent syncopal episodes.  Hospital course complicated by orthostatic hypotension.  Assessment & Plan:   Principal Problem:   Syncope and collapse Active Problems:   Type 2 diabetes mellitus with stage 3 chronic kidney disease, with long-term current use of insulin (HCC)   Essential hypertension   PAD (peripheral artery disease) (HCC)   Hyperlipidemia   CKD (chronic kidney disease), stage III   Dementia without behavioral disturbance (HCC)   Recurrent syncope probably secondary to fluctuating blood pressure parameters, orthostatic hypotension. CT of the head without intracranial findings.  Patient has multiple chronic microhemorrhages consistent with hypertensive angiopathy. Chest x-ray does not show any acute cardiopulmonary disease. Echocardiogram showed left ventricular ejection fraction of 60 to 65% with no regional wall abnormalities, grade 1 diastolic dysfunction, mildly dilated left atrial size, no aortic stenosis. MRI of the brain did not show any acute intracranial abnormality or new stroke. EEG ordered does not show any active seizures at this time. Patient continues to have waxing and waning mentation. And his blood pressure also has been very fluctuating. 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  Recheck orthostatic vital signs today.,  Continue with TED hoses.  Hypertension urgency Blood  pressure parameters are better controlled today.  Hypomagnesemia Replaced.  Type 2 diabetes mellitus Hemoglobin A1c 7. CBG (last 3)  Recent Labs    10/28/19 0416 10/28/19 0731 10/28/19 1232  GLUCAP 200* 164* 241*   Continue with sliding scale insulin.    Peripheral artery disease Continue with aspirin and Plavix.   Hyperlipidemia Continue with Crestor 5 mg daily.    History of dementia Minimize the use of benzodiazepines.  Palliative care consulted for goals of care discussion. Keep blinds open during the day and reorientation as appropriate.  PT evaluation.   DVT prophylaxis: Lovenox.  Code Status: full code.  Family Communication: discussed with daughter,  Disposition:   Status is: Inpatient  Remains inpatient appropriate because:Unsafe d/c plan currently waiting for bed at SNF for discharge.   Dispo:  Patient From: Home  Planned Disposition: [place]  Expected discharge date: 10/29/19  Medically stable for discharge: No        Consultants:   Neurology  palliative care   Procedures: none.   Antimicrobials: none.   Subjective: No new complaints at this time.  Objective: Vitals:   10/28/19 0742 10/28/19 1247 10/28/19 1251 10/28/19 1303  BP: (!) 145/77 120/64 103/62 123/67  Pulse: 77 72 72 71  Resp: 19 19    Temp: (!) 97.5 F (36.4 C)     TempSrc:      SpO2: 100% 100% 100% 100%  Weight:        Intake/Output Summary (Last 24 hours) at 10/28/2019 1353 Last data filed at 10/28/2019 0314 Gross per 24 hour  Intake 3 ml  Output 1300 ml  Net -1297 ml   Filed Weights   10/26/19 0244 10/27/19 2144 10/28/19 0115  Weight: 85.3 kg 87 kg 87 kg  Examination:  General exam: Appears calm and comfortable  Respiratory system: Clear to auscultation. Respiratory effort normal. Cardiovascular system: S1 & S2 heard, RRR. No JVD,  No pedal edema. Gastrointestinal system: Abdomen is nondistended, soft and nontender.  Normal bowel  sounds heard. Central nervous system: Alert and oriented to person and place.  Extremities: no pedal edema.  Skin: No rashes, lesions or ulcers Psychiatry:  Mood & affect appropriate.     Data Reviewed: I have personally reviewed following labs and imaging studies  CBC: Recent Labs  Lab 10/27/19 0201  WBC 4.7  HGB 8.7*  HCT 28.2*  MCV 87.9  PLT 401    Basic Metabolic Panel: Recent Labs  Lab 10/22/19 0126 10/23/19 0115 10/24/19 0118 10/27/19 0201  NA 138 135 137 137  K 4.0 4.2 4.1 4.4  CL 106 105 105 105  CO2 24 25 24 24   GLUCOSE 92 145* 88 172*  BUN 23 25* 26* 25*  CREATININE 1.53* 1.60* 1.56* 1.59*  CALCIUM 8.3* 8.3* 8.5* 8.4*  MG 1.4* 2.0 1.9 1.7    GFR: Estimated Creatinine Clearance: 46.1 mL/min (A) (by C-G formula based on SCr of 1.59 mg/dL (H)).  Liver Function Tests: No results for input(s): AST, ALT, ALKPHOS, BILITOT, PROT, ALBUMIN in the last 168 hours.  CBG: Recent Labs  Lab 10/27/19 1551 10/27/19 2127 10/28/19 0416 10/28/19 0731 10/28/19 1232  GLUCAP 179* 264* 200* 164* 241*     Recent Results (from the past 240 hour(s))  SARS Coronavirus 2 by RT PCR (hospital order, performed in [facility] hospital lab) Nasopharyngeal Nasopharyngeal Swab     Status: None   Collection Time: 10/20/19  4:17 PM   Specimen: Nasopharyngeal Swab  Result Value Ref Range Status   SARS Coronavirus 2 NEGATIVE NEGATIVE Final    Comment: (NOTE) SARS-CoV-2 target nucleic acids are NOT DETECTED.  The SARS-CoV-2 RNA is generally detectable in upper and lower respiratory specimens during the acute phase of infection. The lowest concentration of SARS-CoV-2 viral copies this assay can detect is 250 copies / mL. A negative result does not preclude SARS-CoV-2 infection and should not be used as the sole basis for treatment or other patient management decisions.  A negative result may occur with improper specimen collection / handling, submission of specimen other than  nasopharyngeal swab, presence of viral mutation(s) within the areas targeted by this assay, and inadequate number of viral copies (<250 copies / mL). A negative result must be combined with clinical observations, patient history, and epidemiological information.  Fact Sheet for Patients:   StrictlyIdeas.no  Fact Sheet for Healthcare Providers: BankingDealers.co.za  This test is not yet approved or  cleared by the [place] FDA and has been authorized for detection and/or diagnosis of SARS-CoV-2 by FDA under an Emergency Use Authorization (EUA).  This EUA will remain in effect (meaning this test can be used) for the duration of the COVID-19 declaration under Section 564(b)(1) of the Act, 21 U.S.C. section 360bbb-3(b)(1), unless the authorization is terminated or revoked sooner.  Performed at [facility], [place] [address], [place], [place] [address]   SARS Coronavirus 2 by RT PCR (hospital order, performed in [facility] hospital lab) Nasopharyngeal Nasopharyngeal Swab     Status: None   Collection Time: 10/25/19  9:31 AM   Specimen: Nasopharyngeal Swab  Result Value Ref Range Status   SARS Coronavirus 2 NEGATIVE NEGATIVE Final    Comment: (NOTE) SARS-CoV-2 target nucleic acids are NOT DETECTED.  The SARS-CoV-2 RNA  is generally detectable in upper and lower respiratory specimens during the acute phase of infection. The lowest concentration of SARS-CoV-2 viral copies this assay can detect is 250 copies / mL. A negative result does not preclude SARS-CoV-2 infection and should not be used as the sole basis for treatment or other patient management decisions.  A negative result may occur with improper specimen collection / handling, submission of specimen other than nasopharyngeal swab, presence of viral mutation(s) within the areas targeted by this assay, and inadequate number of viral copies (<250 copies / mL). A negative  result must be combined with clinical observations, patient history, and epidemiological information.  Fact Sheet for Patients:   StrictlyIdeas.no  Fact Sheet for Healthcare Providers: BankingDealers.co.za  This test is not yet approved or  cleared by the [place] FDA and has been authorized for detection and/or diagnosis of SARS-CoV-2 by FDA under an Emergency Use Authorization (EUA).  This EUA will remain in effect (meaning this test can be used) for the duration of the COVID-19 declaration under Section 564(b)(1) of the Act, 21 U.S.C. section 360bbb-3(b)(1), unless the authorization is terminated or revoked sooner.  Performed at [facility], [place] [address], [place], [place] [address]          Radiology Studies: MR BRAIN WO CONTRAST  Result Date: 10/27/2019 CLINICAL DATA:  Mental status change, persistent or worsening. EXAM: MRI HEAD WITHOUT CONTRAST TECHNIQUE: Multiplanar, multiecho pulse sequences of the brain and surrounding structures were obtained without intravenous contrast. COMPARISON:  10/21/2019 FINDINGS: Brain: No acute infarct, mass, midline shift, or extra-axial fluid collection is identified. Scattered T2 hyperintensities in the cerebral white matter bilaterally and extensive T2 hyperintensity in the pons are unchanged and nonspecific but compatible with chronic small vessel ischemic disease. Chronic infarcts are again noted in the cerebellum bilaterally, pons, thalami, occipital lobes (left larger than right), posterior right parietal lobe, and right basal ganglia. There are chronic blood products associated with the thalamic and occipital infarcts, and there are additional scattered chronic microhemorrhages elsewhere in both cerebral hemispheres and in the pons and cerebellum. There is mild-to-moderate cerebral atrophy. Vascular: Major intracranial vascular flow voids are preserved. Skull and upper cervical  spine: Unchanged heterogeneously diminished bone marrow T1 signal intensity in the skull, nonspecific. Sinuses/Orbits: Bilateral cataract extraction. Paranasal sinuses and mastoid air cells are clear. Other: None. IMPRESSION: 1. No acute intracranial abnormality. 2. Unchanged extensive chronic ischemia with multiple old infarcts. Electronically Signed   By: Logan Bores M.D.   On: 10/27/2019 21:29   EEG adult  Result Date: 10/27/2019 Lora Havens, MD     10/27/2019  5:50 PM Patient Name: Harry [name] MRN: [id number] Epilepsy Attending: Lora Havens Referring Physician/Provider: Dr. Eric [place] Date: 10/27/2019 Duration: 23.36 mins Patient history: 74 year old male with episodes of passing out with seizure-like activity.  EEG to evaluate for seizures. Level of alertness: Awake,asleep AEDs during EEG study: None Technical aspects: This EEG study was done with scalp electrodes positioned according to the 10-20 International system of electrode placement. Electrical activity was acquired at a sampling rate of 500Hz  and reviewed with a high frequency filter of 70Hz  and a low frequency filter of 1Hz . EEG data were recorded continuously and digitally stored. Description: The posterior dominant rhythm consists of 8Hz  activity of moderate voltage (25-35 uV) seen predominantly in posterior head regions, symmetric and reactive to eye opening and eye closing. Sleep was characterized by vertex waves, sleep spindles (12 to 14 Hz), maximal frontocentral region.  EEG showed intermittent 3 to 6 Hz theta-delta slowing in left temporal region.  Hyperventilation and photic stimulation were not performed.   ABNORMALITY -Intermittent slow, left temporal region IMPRESSION: This study is suggestive of non specific cortical dysfunction in  left temporal region. No seizures or epileptiform discharges were seen throughout the recording. Priyanka Barbra Sarks        Scheduled Meds: . aspirin EC  81 mg Oral QPM  . carvedilol   25 mg Oral BID WC  . clopidogrel  75 mg Oral Daily  . enoxaparin (LOVENOX) injection  40 mg Subcutaneous Q24H  . feeding supplement (ENSURE ENLIVE)  237 mL Oral BID BM  . insulin aspart  0-5 Units Subcutaneous QHS  . insulin aspart  0-9 Units Subcutaneous TID WC  . insulin glargine  8 Units Subcutaneous QHS  . pyridostigmine  30 mg Oral Q8H  . rosuvastatin  5 mg Oral QPM  . sodium chloride flush  3 mL Intravenous Q12H   Continuous Infusions:   LOS: 7 days        Hosie Poisson, MD [facility]   To contact the attending provider between 7A-7P or the covering provider during after hours 7P-7A, please log into the web site [email or web address] and access using universal [place] password for that web site. If you do not have the password, please call the hospital operator.  10/28/2019, 1:53 PM

## 2019-10-28 NOTE — Progress Notes (Addendum)
Brief progress note:  Was called to discuss POC with family.  Spoke with daughter [name] via telephone. EEG results discussed with patient as well as neurogenic hypotension as the culprit for syncopal episodes.. Per daughter patient stopped taking mestinon over a year ago d/t not being able to obtain a refill. PCP would not refill the medication because PCP did not order the medication initially, and patient's neurologist did not believe that he still needed the mestinon.  Daughter is agreeable to restarting the medication.    Neuro exam: MS: awake, alert, oriented to name/age/year. Missed month. No real confusion displayed, but seems to be unreliable historian. Able to follow simple commands.  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 Tongue protrudes midline. Motor: able to raise all 4 extremities anti-gravity. Cerebellar: dysmetria in bilateral FNF, no ataxia in BLE.  Plan: --Ted hose --Start mestinon 30 mg TID. Can gradually increased to 60 mg TID as needed. If needed, midodrine 5 mg TID; to be taken at same time as mestinon, can be added to the regimen to decrease frequency/severity of hypotensive episodes.  -- Follow up with outpatient neurology; would suggest a neurologist who specializes in neurogenic dysautonomias.  Laurey Morale, MSN, NP-C [facility] [phone]   In addition to 15 minutes of bedside assessment, 20 minutes spent updating family.  Electronically signed: Dr. Kerney Elbe

## 2019-10-28 NOTE — TOC Progression Note (Addendum)
Transition of Care [facility]) - Progression Note    Patient Details  Name: Harry [name] MRN: [id number] Date of Birth: 07-22-1945  [facility] CM/SW Contact  Joanne Chars, LCSW Phone Number: 10/28/2019, 2:29 PM  Clinical Narrative:   CSW spoke with [name].  She is not interested in any of the SNF facilities that have currently offered except for [facility] care, who does not currently have a bed.  She requested pt be referred to [facility] and to [facility], which was done.  CSW will also check back with [facility] and [facility]. 1434: [facility] under renovations, no beds until first of the year.  LM with [facility].    Expected Discharge Plan: Skilled Nursing Facility Barriers to Discharge: Continued Medical Work up  Expected Discharge Plan and Services Expected Discharge Plan: [place]         Expected Discharge Date: 10/27/19                                     Social Determinants of Health (SDOH) Interventions    Readmission Risk Interventions Readmission Risk Prevention Plan 09/02/2018 08/14/2018  Transportation Screening Complete Complete  PCP or Specialist Appt within 3-5 Days - Complete  HRI or Home Care Consult - Not Complete  HRI or Home Care Consult comments - pt discharging to SNF  Social Work Consult for [place] Planning/Counseling - Not Complete  SW consult not completed comments - no needs  Palliative Care Screening - Not Applicable  Medication Review Press photographer) - Referral to Pharmacy  PCP or Specialist appointment within 3-5 days of discharge Complete -  SW Recovery Care/Counseling Consult Complete -  Palliative Care Screening Not Applicable -  Skilled Nursing Facility Complete -  Some recent data might be hidden

## 2019-10-28 NOTE — Progress Notes (Signed)
Patient has a episode during orthostatic vital signs were he became very still and not responsive with a thready pulse lasting less than a minute with bowel incontinence. Dr. Karleen Hampshire notified. Patient back at baseline now speaking with stable vital signs and no recollection of the event.

## 2019-10-28 NOTE — Plan of Care (Signed)

## 2019-10-28 NOTE — Progress Notes (Signed)
   10/28/19 1100  Clinical Encounter Type  Visited With Patient  Visit Type Initial  Referral From Palliative care team  Consult/Referral To [facility] provided patient with advance directive paperwork. Pt said he would like to have some time to read over the paperwork and will call chaplains when he is ready to complete the paperwork. Chaplains remain available for support.   Chaplain Resident, Evelene Croon, M Div [phone] on-call pager

## 2019-10-29 DIAGNOSIS — Z7189 Other specified counseling: Secondary | ICD-10-CM

## 2019-10-29 DIAGNOSIS — Z515 Encounter for palliative care: Secondary | ICD-10-CM

## 2019-10-29 LAB — GLUCOSE, CAPILLARY
Glucose-Capillary: 98 mg/dL (ref 70–99)
Glucose-Capillary: 69 mg/dL — ABNORMAL LOW (ref 70–99)
Glucose-Capillary: 69 mg/dL — ABNORMAL LOW (ref 70–99)
Glucose-Capillary: 139 mg/dL — ABNORMAL HIGH (ref 70–99)
Glucose-Capillary: 158 mg/dL — ABNORMAL HIGH (ref 70–99)
Glucose-Capillary: 252 mg/dL — ABNORMAL HIGH (ref 70–99)

## 2019-10-29 LAB — SJOGRENS SYNDROME-A EXTRACTABLE NUCLEAR ANTIBODY: SSA (Ro) (ENA) Antibody, IgG: <0.2 AI (ref 0.0–0.9)

## 2019-10-29 LAB — SJOGRENS SYNDROME-B EXTRACTABLE NUCLEAR ANTIBODY: SSB (La) (ENA) Antibody, IgG: <0.2 AI (ref 0.0–0.9)

## 2019-10-29 NOTE — Progress Notes (Signed)
Inpatient Diabetes Program Recommendations  AACE/ADA: New Consensus Statement on Inpatient Glycemic Control (2015)  Target Ranges:  Prepandial:   less than 140 mg/dL      Peak postprandial:   less than 180 mg/dL (1-2 hours)      Critically ill patients:  140 - 180 mg/dL   Lab Results  Component Value Date   GLUCAP 139 (H) 10/29/2019   HGBA1C 7.0 (H) 10/20/2019    Review of Glycemic Control Results for [name], Harry [name] (MRN [id number]) as of 10/29/2019 14:16  Ref. Range 10/28/2019 21:23 10/29/2019 05:25 10/29/2019 08:02 10/29/2019 08:37 10/29/2019 11:34  Glucose-Capillary Latest Ref Range: 70 - 99 mg/dL 261 (H) 98 69 (L) 69 (L) 139 (H)    Diabetes history: DM 2 Outpatient Diabetes medications:  Novolog 0-9 units tid with meals Basaglar 30 units q HS Current orders for Inpatient glycemic control:  Novolog sensitive tid with meals and HS Lantus 8 units q HS  Inpatient Diabetes Program Recommendations:    May consider d/c of Novolog bedtime scale.  Patient received 3 units for CBG of 261 mg/dL last PM and CBG=69 mg/dL this AM.    Thanks,  Adah Perl, RN, BC-ADM Inpatient Diabetes Coordinator Pager [phone] (8a-5p)

## 2019-10-29 NOTE — Plan of Care (Signed)

## 2019-10-29 NOTE — Progress Notes (Signed)
Occupational Therapy Treatment Patient Details Name: Harry [name] MRN: [id number] DOB: 1945-08-10 Today's Date: 10/29/2019    History of present illness Harry [name] is a 74 y.o. male with medical history significant of chronic kidney disease stage III, hypertension, hyperlipidemia, peripheral vascular disease and peripheral neuropathy with previous syncopal episode, CVA and type 2 diabetes poorly controlled who presented to the ER with recurrent syncopal episodes.   OT comments  Pt reporting need for toileting task on entry. Guided pt in bed mobility and pivot to [facility] with RW at [place] A x 2. During BM on BSC, BP continued to drop with 70s/50s in standing at Max A for posterior hygiene in standing. Pt became less responsive and difficulty following safety and sequencing commands for quick assist to recliner. Once seated and legs elevated, BP increased. Continue to recommend SNF for ST rehab.    Follow Up Recommendations  SNF;Supervision/Assistance - 24 hour    Equipment Recommendations  3 in 1 bedside commode    Recommendations for Other Services      Precautions / Restrictions Precautions Precautions: Fall Precaution Comments: orthostatic, dementia, will pass out on you Restrictions Weight Bearing Restrictions: No       Mobility Bed Mobility Overal bed mobility: Needs Assistance Bed Mobility: Supine to Sit     Supine to sit: Min assist     General bed mobility comments: MinA to get to EOB, extended time and increased effort  Transfers Overall transfer level: Needs assistance Equipment used: Rolling walker (2 wheeled) Transfers: Sit to/from [facility] Sit to Stand: Min assist Stand pivot transfers: Min assist;+2 physical assistance       General transfer comment: MinAx2 for safety given history of frequent BP drops and syncopal events    Balance Overall balance assessment: Needs assistance;History of Falls Sitting-balance support: No upper  extremity supported;Feet supported Sitting balance-Leahy Scale: Fair     Standing balance support: Bilateral upper extremity supported;During functional activity Standing balance-Leahy Scale: Poor Standing balance comment: able to attain upright posture with assist only, otherwise significantly flexed and heavily reliant on BUE support                           ADL either performed or assessed with clinical judgement   ADL Overall ADL's : Needs assistance/impaired                         Toilet Transfer: Minimal assistance;+2 for physical assistance;+2 for safety/equipment;Cueing for safety;Cueing for sequencing;Stand-pivot;BSC;RW Toilet Transfer Details (indicate cue type and reason): Min A x 2, physically capable but 2 needed for safety due to dropping BP. When BP lowers, pt with difficulty following safety/sequencing cues Toileting- Clothing Manipulation and Hygiene: Maximal assistance;Sit to/from stand Toileting - Clothing Manipulation Details (indicate cue type and reason): Max A for posterior hygiene, some bowel incontinence noted on chuck pad       General ADL Comments: Pt with decreased strength, endurance, balance, and safety awareness impacting ability to complete ADLs/transfers without physical assist     Vision   Vision Assessment?: Vision impaired- to be further tested in functional context   Perception     Praxis      Cognition Arousal/Alertness: Awake/alert Behavior During Therapy: WFL for tasks assessed/performed Overall Cognitive Status: No family/caregiver present to determine baseline cognitive functioning Area of Impairment: Orientation;Safety/judgement;Awareness;Problem solving;Following commands;Attention  Orientation Level: Time;Situation;Place;Disoriented to Current Attention Level: Sustained   Following Commands: Follows one step commands inconsistently;Follows one step commands with increased  time Safety/Judgement: Decreased awareness of safety;Decreased awareness of deficits Awareness: Intellectual Problem Solving: Difficulty sequencing General Comments: patient with poor STM, states repeatedly he doesn't know where he's at or why he's here and was reoriented as appropriate. Slow processing and difficulty wiht initiation and cue following        Exercises     Shoulder Instructions       General Comments Assessed BP with 124/63 lying in bed, 102/56 sitting EOB, 88/66 sitting on BSC, 70/53 standing after toileting task, and 90/50 (HR 71bpm) after reclining in chair with feet elevated. Pt reports feeling a little woozy in standing    Pertinent Vitals/ Pain       Pain Assessment: Faces Pain Score: 0-No pain Faces Pain Scale: No hurt Pain Intervention(s): Monitored during session  Home Living                                          Prior Functioning/Environment              Frequency  Min 2X/week        Progress Toward Goals  OT Goals(current goals can now be found in the care plan section)  Progress towards OT goals: Progressing toward goals  Acute Rehab OT Goals Patient Stated Goal:  be able to walk with a cane OT Goal Formulation: With patient Time For Goal Achievement: 11/05/19 Potential to Achieve Goals: Good ADL Goals Pt Will Perform Grooming: with supervision;standing Pt Will Perform Lower Body Bathing: with supervision;sit to/from stand Pt Will Perform Lower Body Dressing: with supervision;sit to/from stand Pt Will Transfer to Toilet: with supervision;ambulating;bedside commode Pt Will Perform Toileting - Clothing Manipulation and hygiene: with supervision;sit to/from stand  Plan Discharge plan remains appropriate    Co-evaluation    PT/OT/SLP Co-Evaluation/Treatment: Yes Reason for Co-Treatment: Complexity of the patient's impairments (multi-system involvement);For patient/therapist safety   OT goals addressed during  session: ADL's and self-care      AM-PAC OT "6 Clicks" Daily Activity     Outcome Measure   Help from another person eating meals?: A Little Help from another person taking care of personal grooming?: A Little Help from another person toileting, which includes using toliet, bedpan, or urinal?: A Lot Help from another person bathing (including washing, rinsing, drying)?: A Lot Help from another person to put on and taking off regular upper body clothing?: A Little Help from another person to put on and taking off regular lower body clothing?: A Lot 6 Click Score: 15    End of Session Equipment Utilized During Treatment: Gait belt;Rolling walker  OT Visit Diagnosis: Unsteadiness on feet (R26.81);Other abnormalities of gait and mobility (R26.89);Muscle weakness (generalized) (M62.81);History of falling (Z91.81)   Activity Tolerance Treatment limited secondary to medical complications (Comment) (dropping BP)   Patient Left in chair;with call bell/phone within reach;with chair alarm set   Nurse Communication Mobility status        Time: 9485-4627 OT Time Calculation (min): 31 min  Charges: OT General Charges $OT Visit: 1 Visit OT Treatments $Self Care/Home Management : 8-22 mins  Layla Maw, OTR/L   Layla Maw 10/29/2019, 2:03 PM

## 2019-10-29 NOTE — TOC Progression Note (Signed)
Transition of Care [facility]) - Progression Note    Patient Details  Name: DAY DEERY MRN: [id number] Date of Birth: November 04, 1945  Transition [facility]) CM/SW Contact  Joanne Chars, LCSW Phone Number: 10/29/2019, 2:53 PM  Clinical Narrative:   At daughter's request, CSW called [facility] and [facility]: both are unable to accept pt.  Several phone calls made to [facility] but no response.   [facility] called back and has a potential bed for the patient but this would be in a semi private room.  [facility] also asked to confirm with daughter that this is not a long term care bed.  CSW spoke with daughter regarding these two issues.  Daughter aware that it would not be long term care but said pt is adamant about having a private room. Her husband will come to the hospital to discuss this with pt and she will get back to CSW.    Expected Discharge Plan: Skilled Nursing Facility Barriers to Discharge: Continued Medical Work up  Expected Discharge Plan and Services Expected Discharge Plan: [place]         Expected Discharge Date: 10/27/19                                     Social Determinants of Health (SDOH) Interventions    Readmission Risk Interventions Readmission Risk Prevention Plan 09/02/2018 08/14/2018  Transportation Screening Complete Complete  PCP or Specialist Appt within 3-5 Days - Complete  HRI or Home Care Consult - Not Complete  HRI or Home Care Consult comments - pt discharging to SNF  Social Work Consult for [place] Planning/Counseling - Not Complete  SW consult not completed comments - no needs  Palliative Care Screening - Not Applicable  Medication Review [employer]) - Referral to Pharmacy  PCP or Specialist appointment within 3-5 days of discharge Complete -  SW Recovery Care/Counseling Consult Complete -  Palliative Care Screening Not Applicable -  Skilled Nursing Facility Complete -  Some recent data might be  hidden

## 2019-10-29 NOTE — Progress Notes (Signed)
Physical Therapy Treatment Patient Details Name: Harry [name] MRN: [id number] DOB: May 22, 1945 Today's Date: 10/29/2019    History of Present Illness Harry [name] is a 74 y.o. male with medical history significant of chronic kidney disease stage III, hypertension, hyperlipidemia, peripheral vascular disease and peripheral neuropathy with previous syncopal episode, CVA and type 2 diabetes poorly controlled who presented to the ER with recurrent syncopal episodes.    PT Comments    Patient received in bed reporting he has to have a BM; see below for mobility/assist levels. Strongly benefits from +2 assist for all mobility due to ongoing severe orthostatic drops with positional changes- see vitals section for details. Needed no more than MinAx2 for safety with transfers with RW, however at one point his BP dropped to 70s/50s in standing and he became less responsive while standing for pericare after toileting; he was quickly pivoted to the chair and reclined back, after which he became more responsive and BP returned to 103/50-60. Nursing staff updated on patient status and mobility. Left reclined in bedside chair with all needs met, chair alarm active this morning. Continue to recommend SNF and 24/7A.     Follow Up Recommendations  Supervision/Assistance - 24 hour;SNF     Equipment Recommendations  Other (comment) (defer to next setting)    Recommendations for Other Services       Precautions / Restrictions Precautions Precautions: Fall Precaution Comments: orthostatic, dementia, will pass out on you    Mobility  Bed Mobility Overal bed mobility: Needs Assistance Bed Mobility: Supine to Sit     Supine to sit: Min assist     General bed mobility comments: MinA to get to EOB, extended time and increased effort  Transfers Overall transfer level: Needs assistance Equipment used: Rolling walker (2 wheeled) Transfers: Stand Pivot Transfers Sit to Stand: Min assist Stand  pivot transfers: Min assist;+2 physical assistance       General transfer comment: MinAx2 for safety given history of frequent BP drops and syncopal events  Ambulation/Gait             General Gait Details: deferred- significant BP drop with positional changes   Stairs             Wheelchair Mobility    Modified Rankin (Stroke Patients Only)       Balance Overall balance assessment: Needs assistance;History of Falls Sitting-balance support: No upper extremity supported;Feet supported Sitting balance-Leahy Scale: Fair     Standing balance support: Bilateral upper extremity supported;During functional activity Standing balance-Leahy Scale: Poor Standing balance comment: able to attain upright posture with assist only, otherwise significantly flexed and heavily reliant on BUE support                            Cognition Arousal/Alertness: Awake/alert Behavior During Therapy: [facility] for tasks assessed/performed                     Orientation Level: Time;Situation;Place;Disoriented to Current Attention Level: Sustained   Following Commands: Follows one step commands inconsistently;Follows one step commands with increased time Safety/Judgement: Decreased awareness of safety;Decreased awareness of deficits Awareness: Intellectual Problem Solving: Difficulty sequencing General Comments: patient with poor STM, states repeatedly he doesn't know where he's at or why he's here and was reoriented as appropriate. Slow processing and difficulty wiht initiation and cue following      Exercises      General Comments  Pertinent Vitals/Pain Pain Assessment: Faces Pain Score: 0-No pain Faces Pain Scale: No hurt Pain Intervention(s): Monitored during session;Limited activity within patient's tolerance    Home Living                      Prior Function            PT Goals (current goals can now be found in the care plan section)  Acute Rehab PT Goals Patient Stated Goal:  be able to walk with a cane PT Goal Formulation: With patient Time For Goal Achievement: 10/22/19 Potential to Achieve Goals: Good Progress towards PT goals: Not progressing toward goals - comment (limited by orthostatic BP drop today)    Frequency    Min 2X/week      PT Plan Current plan remains appropriate    Co-evaluation              AM-PAC PT "6 Clicks" Mobility   Outcome Measure  Help needed turning from your back to your side while in a flat bed without using bedrails?: A Little Help needed moving from lying on your back to sitting on the side of a flat bed without using bedrails?: A Little Help needed moving to and from a bed to a chair (including a wheelchair)?: A Little Help needed standing up from a chair using your arms (e.g., wheelchair or bedside chair)?: A Little Help needed to walk in hospital room?: A Little Help needed climbing 3-5 steps with a railing? : A Lot 6 Click Score: 17    End of Session Equipment Utilized During Treatment: Gait belt Activity Tolerance: Patient tolerated treatment well Patient left: in chair;with call bell/phone within reach;with chair alarm set Nurse Communication: Mobility status PT Visit Diagnosis: Other abnormalities of gait and mobility (R26.89);Muscle weakness (generalized) (M62.81)     Time: 0865-7846 PT Time Calculation (min) (ACUTE ONLY): 31 min  Charges:  $Therapeutic Activity: 8-22 mins (co-tx with OT)                     Windell Norfolk, DPT, PN1   Supplemental Physical Therapist [place]    Pager [phone] Acute Rehab Office [phone]

## 2019-10-29 NOTE — Progress Notes (Signed)
PROGRESS NOTE    Harry [name]  XAJ:[id number] DOB: Nov 24, 1945 DOA: 10/20/2019 PCP: [name], MD    Chief Complaint  Patient presents with  . Loss of Consciousness    Brief Narrative:  74 year old male with prior history of stage IIIa CKD, essential hypertension, hyperlipidemia, peripheral vascular disease, peripheral neuropathy, CVA, type 2 diabetes mellitus, presents to ED with recurrent syncopal episodes.  Hospital course complicated by orthostatic hypotension, which appears to have resolved at this time. Patient seen and examined this morning.  Alert appears to be oriented to place and person, denied any new complaints..  Assessment & Plan:   Principal Problem:   Syncope and collapse Active Problems:   Type 2 diabetes mellitus with stage 3 chronic kidney disease, with long-term current use of insulin (HCC)   Essential hypertension   PAD (peripheral artery disease) (HCC)   Hyperlipidemia   CKD (chronic kidney disease), stage III   Dementia without behavioral disturbance ([place])   Palliative care by specialist   DNR (do not resuscitate) discussion   Recurrent syncope probably secondary to fluctuating blood pressure parameters, orthostatic hypotension. CT of the head without intracranial findings.  Patient has multiple chronic microhemorrhages consistent with hypertensive angiopathy. Chest x-ray does not show any acute cardiopulmonary disease. Echocardiogram showed left ventricular ejection fraction of 60 to 65% with no regional wall abnormalities, grade 1 diastolic dysfunction, mildly dilated left atrial size, no aortic stenosis. MRI of the brain did not show any acute intracranial abnormality or new stroke. EEG ordered does not show any active seizures at this time. Patient continues to have waxing and waning mentation and  his blood pressure also has been very fluctuating. Neurology consulted recommended restarting the patient on Mestinon 30 mg 3 times daily and  increase the dose up to 60 mg 3 times daily and recommend outpatient tertiary neurology referral if referral who specializes in neurogenic dysautonomy.  Repeat orthostatic vital signs are negative.  Continue with TED hoses.  Hypertension urgency Blood pressure parameters are better controlled..  Hypomagnesemia Replaced.  Type 2 diabetes mellitus Hemoglobin A1c 7. CBG (last 3)  Recent Labs    10/29/19 0802 10/29/19 0837 10/29/19 1134  GLUCAP 69* 69* 139*   Patient CBGs have been low earlier this morning we will discontinue Lantus 8 units at this time.  Continue with sliding scale insulin.    Peripheral artery disease Continue with aspirin and Plavix.   Hyperlipidemia Continue with Crestor 5 mg daily.    History of dementia Minimize the use of benzodiazepines.  Palliative care consulted for goals of care discussion. Keep blinds open during the day and reorientation as appropriate.  PT evaluation recommending SNF.   DVT prophylaxis: Lovenox.  Code Status: full code.  Family Communication: discussed with daughter,  Disposition:   Status is: Inpatient  Remains inpatient appropriate because:Unsafe d/c plan currently waiting for bed at SNF for discharge.   Dispo:  Patient From: Home  Planned Disposition: [place]  Expected discharge date: 10/30/19  Medically stable for discharge: No        Consultants:   Neurology  palliative care   Procedures: none.   Antimicrobials: none.   Subjective: No new complaints at this time.  Objective: Vitals:   10/28/19 2249 10/29/19 0500 10/29/19 0635 10/29/19 0802  BP: (!) 135/59   (!) 158/73  Pulse: 73   73  Resp: 18   16  Temp: 98.3 F (36.8 C)   98.2 F (36.8 C)  TempSrc: Oral  SpO2: 100%   99%  Weight:  87.4 kg 87 kg     Intake/Output Summary (Last 24 hours) at 10/29/2019 1601 Last data filed at 10/29/2019 0550 Gross per 24 hour  Intake 90 ml  Output 1150 ml  Net -1060 ml   Filed  Weights   10/28/19 0115 10/29/19 0500 10/29/19 0635  Weight: 87 kg 87.4 kg 87 kg    Examination:  General exam: Alert and comfortable, not not in distress Respiratory system: Clear to auscultation bilaterally, no wheezing or rhonchi Cardiovascular system: S1-S2 heard, regular rate rhythm, no JVD, no pedal edema gastrointestinal system: Abdomen is soft, nontender, nondistended bowel sounds normal Central nervous system: Alert and oriented to place and person, grossly nonfocal Extremities: No pedal edema Skin: No rashes Psychiatry: Mood is appropriate   Data Reviewed: I have personally reviewed following labs and imaging studies  CBC: Recent Labs  Lab 10/27/19 0201  WBC 4.7  HGB 8.7*  HCT 28.2*  MCV 87.9  PLT 132    Basic Metabolic Panel: Recent Labs  Lab 10/23/19 0115 10/24/19 0118 10/27/19 0201  NA 135 137 137  K 4.2 4.1 4.4  CL 105 105 105  CO2 25 24 24   GLUCOSE 145* 88 172*  BUN 25* 26* 25*  CREATININE 1.60* 1.56* 1.59*  CALCIUM 8.3* 8.5* 8.4*  MG 2.0 1.9 1.7    GFR: Estimated Creatinine Clearance: 46.1 mL/min (A) (by C-G formula based on SCr of 1.59 mg/dL (H)).  Liver Function Tests: No results for input(s): AST, ALT, ALKPHOS, BILITOT, PROT, ALBUMIN in the last 168 hours.  CBG: Recent Labs  Lab 10/28/19 2123 10/29/19 0525 10/29/19 0802 10/29/19 0837 10/29/19 1134  GLUCAP 261* 98 69* 69* 139*     Recent Results (from the past 240 hour(s))  SARS Coronavirus 2 by RT PCR (hospital order, performed in [facility] hospital lab) Nasopharyngeal Nasopharyngeal Swab     Status: None   Collection Time: 10/20/19  4:17 PM   Specimen: Nasopharyngeal Swab  Result Value Ref Range Status   SARS Coronavirus 2 NEGATIVE NEGATIVE Final    Comment: (NOTE) SARS-CoV-2 target nucleic acids are NOT DETECTED.  The SARS-CoV-2 RNA is generally detectable in upper and lower respiratory specimens during the acute phase of infection. The lowest concentration of SARS-CoV-2  viral copies this assay can detect is 250 copies / mL. A negative result does not preclude SARS-CoV-2 infection and should not be used as the sole basis for treatment or other patient management decisions.  A negative result may occur with improper specimen collection / handling, submission of specimen other than nasopharyngeal swab, presence of viral mutation(s) within the areas targeted by this assay, and inadequate number of viral copies (<250 copies / mL). A negative result must be combined with clinical observations, patient history, and epidemiological information.  Fact Sheet for Patients:   StrictlyIdeas.no  Fact Sheet for Healthcare Providers: BankingDealers.co.za  This test is not yet approved or  cleared by the [place] FDA and has been authorized for detection and/or diagnosis of SARS-CoV-2 by FDA under an Emergency Use Authorization (EUA).  This EUA will remain in effect (meaning this test can be used) for the duration of the COVID-19 declaration under Section 564(b)(1) of the Act, 21 U.S.C. section 360bbb-3(b)(1), unless the authorization is terminated or revoked sooner.  Performed at [facility], [place] [address], [place], [place] [address]   SARS Coronavirus 2 by RT PCR (hospital order, performed in [facility] hospital lab) Nasopharyngeal  Nasopharyngeal Swab     Status: None   Collection Time: 10/25/19  9:31 AM   Specimen: Nasopharyngeal Swab  Result Value Ref Range Status   SARS Coronavirus 2 NEGATIVE NEGATIVE Final    Comment: (NOTE) SARS-CoV-2 target nucleic acids are NOT DETECTED.  The SARS-CoV-2 RNA is generally detectable in upper and lower respiratory specimens during the acute phase of infection. The lowest concentration of SARS-CoV-2 viral copies this assay can detect is 250 copies / mL. A negative result does not preclude SARS-CoV-2 infection and should not be used as the sole basis for treatment  or other patient management decisions.  A negative result may occur with improper specimen collection / handling, submission of specimen other than nasopharyngeal swab, presence of viral mutation(s) within the areas targeted by this assay, and inadequate number of viral copies (<250 copies / mL). A negative result must be combined with clinical observations, patient history, and epidemiological information.  Fact Sheet for Patients:   StrictlyIdeas.no  Fact Sheet for Healthcare Providers: BankingDealers.co.za  This test is not yet approved or  cleared by the [place] FDA and has been authorized for detection and/or diagnosis of SARS-CoV-2 by FDA under an Emergency Use Authorization (EUA).  This EUA will remain in effect (meaning this test can be used) for the duration of the COVID-19 declaration under Section 564(b)(1) of the Act, 21 U.S.C. section 360bbb-3(b)(1), unless the authorization is terminated or revoked sooner.  Performed at [facility], [place] [address], [place], [place] [address]          Radiology Studies: MR BRAIN WO CONTRAST  Result Date: 10/27/2019 CLINICAL DATA:  Mental status change, persistent or worsening. EXAM: MRI HEAD WITHOUT CONTRAST TECHNIQUE: Multiplanar, multiecho pulse sequences of the brain and surrounding structures were obtained without intravenous contrast. COMPARISON:  10/21/2019 FINDINGS: Brain: No acute infarct, mass, midline shift, or extra-axial fluid collection is identified. Scattered T2 hyperintensities in the cerebral white matter bilaterally and extensive T2 hyperintensity in the pons are unchanged and nonspecific but compatible with chronic small vessel ischemic disease. Chronic infarcts are again noted in the cerebellum bilaterally, pons, thalami, occipital lobes (left larger than right), posterior right parietal lobe, and right basal ganglia. There are chronic blood products  associated with the thalamic and occipital infarcts, and there are additional scattered chronic microhemorrhages elsewhere in both cerebral hemispheres and in the pons and cerebellum. There is mild-to-moderate cerebral atrophy. Vascular: Major intracranial vascular flow voids are preserved. Skull and upper cervical spine: Unchanged heterogeneously diminished bone marrow T1 signal intensity in the skull, nonspecific. Sinuses/Orbits: Bilateral cataract extraction. Paranasal sinuses and mastoid air cells are clear. Other: None. IMPRESSION: 1. No acute intracranial abnormality. 2. Unchanged extensive chronic ischemia with multiple old infarcts. Electronically Signed   By: Logan Bores M.D.   On: 10/27/2019 21:29        Scheduled Meds: . aspirin EC  81 mg Oral QPM  . carvedilol  25 mg Oral BID WC  . clopidogrel  75 mg Oral Daily  . enoxaparin (LOVENOX) injection  40 mg Subcutaneous Q24H  . feeding supplement (ENSURE ENLIVE)  237 mL Oral BID BM  . insulin aspart  0-5 Units Subcutaneous QHS  . insulin aspart  0-9 Units Subcutaneous TID WC  . insulin glargine  8 Units Subcutaneous QHS  . pyridostigmine  30 mg Oral Q8H  . rosuvastatin  5 mg Oral QPM  . sodium chloride flush  3 mL Intravenous Q12H   Continuous Infusions:  LOS: 8 days        Hosie Poisson, MD [facility]   To contact the attending provider between 7A-7P or the covering provider during after hours 7P-7A, please log into the web site [email or web address] and access using universal [place] password for that web site. If you do not have the password, please call the hospital operator.  10/29/2019, 4:01 PM

## 2019-10-30 DIAGNOSIS — F411 Generalized anxiety disorder: Secondary | ICD-10-CM | POA: Diagnosis not present

## 2019-10-30 DIAGNOSIS — R5383 Other fatigue: Secondary | ICD-10-CM | POA: Diagnosis not present

## 2019-10-30 DIAGNOSIS — Z515 Encounter for palliative care: Secondary | ICD-10-CM | POA: Diagnosis not present

## 2019-10-30 DIAGNOSIS — R0989 Other specified symptoms and signs involving the circulatory and respiratory systems: Secondary | ICD-10-CM | POA: Diagnosis not present

## 2019-10-30 DIAGNOSIS — I469 Cardiac arrest, cause unspecified: Secondary | ICD-10-CM | POA: Diagnosis not present

## 2019-10-30 DIAGNOSIS — Z7401 Bed confinement status: Secondary | ICD-10-CM | POA: Diagnosis not present

## 2019-10-30 DIAGNOSIS — R296 Repeated falls: Secondary | ICD-10-CM | POA: Diagnosis not present

## 2019-10-30 DIAGNOSIS — F0391 Unspecified dementia with behavioral disturbance: Secondary | ICD-10-CM | POA: Diagnosis not present

## 2019-10-30 DIAGNOSIS — E1143 Type 2 diabetes mellitus with diabetic autonomic (poly)neuropathy: Secondary | ICD-10-CM | POA: Diagnosis not present

## 2019-10-30 DIAGNOSIS — R4182 Altered mental status, unspecified: Secondary | ICD-10-CM | POA: Diagnosis not present

## 2019-10-30 DIAGNOSIS — R05 Cough: Secondary | ICD-10-CM | POA: Diagnosis not present

## 2019-10-30 DIAGNOSIS — G9009 Other idiopathic peripheral autonomic neuropathy: Secondary | ICD-10-CM | POA: Diagnosis not present

## 2019-10-30 DIAGNOSIS — Z20822 Contact with and (suspected) exposure to covid-19: Secondary | ICD-10-CM | POA: Diagnosis not present

## 2019-10-30 DIAGNOSIS — H04123 Dry eye syndrome of bilateral lacrimal glands: Secondary | ICD-10-CM | POA: Diagnosis not present

## 2019-10-30 DIAGNOSIS — F0390 Unspecified dementia without behavioral disturbance: Secondary | ICD-10-CM | POA: Diagnosis not present

## 2019-10-30 DIAGNOSIS — R441 Visual hallucinations: Secondary | ICD-10-CM | POA: Diagnosis not present

## 2019-10-30 DIAGNOSIS — I639 Cerebral infarction, unspecified: Secondary | ICD-10-CM | POA: Diagnosis not present

## 2019-10-30 DIAGNOSIS — R5381 Other malaise: Secondary | ICD-10-CM | POA: Diagnosis not present

## 2019-10-30 DIAGNOSIS — I951 Orthostatic hypotension: Secondary | ICD-10-CM | POA: Diagnosis not present

## 2019-10-30 DIAGNOSIS — R55 Syncope and collapse: Secondary | ICD-10-CM | POA: Diagnosis not present

## 2019-10-30 DIAGNOSIS — N1831 Chronic kidney disease, stage 3a: Secondary | ICD-10-CM | POA: Diagnosis not present

## 2019-10-30 DIAGNOSIS — M2550 Pain in unspecified joint: Secondary | ICD-10-CM | POA: Diagnosis not present

## 2019-10-30 DIAGNOSIS — R262 Difficulty in walking, not elsewhere classified: Secondary | ICD-10-CM | POA: Diagnosis not present

## 2019-10-30 DIAGNOSIS — R451 Restlessness and agitation: Secondary | ICD-10-CM | POA: Diagnosis not present

## 2019-10-30 DIAGNOSIS — F332 Major depressive disorder, recurrent severe without psychotic features: Secondary | ICD-10-CM | POA: Diagnosis not present

## 2019-10-30 DIAGNOSIS — E1122 Type 2 diabetes mellitus with diabetic chronic kidney disease: Secondary | ICD-10-CM | POA: Diagnosis not present

## 2019-10-30 DIAGNOSIS — I739 Peripheral vascular disease, unspecified: Secondary | ICD-10-CM | POA: Diagnosis not present

## 2019-10-30 DIAGNOSIS — H548 Legal blindness, as defined in USA: Secondary | ICD-10-CM | POA: Diagnosis not present

## 2019-10-30 DIAGNOSIS — E785 Hyperlipidemia, unspecified: Secondary | ICD-10-CM | POA: Diagnosis not present

## 2019-10-30 DIAGNOSIS — F05 Delirium due to known physiological condition: Secondary | ICD-10-CM | POA: Diagnosis not present

## 2019-10-30 DIAGNOSIS — N183 Chronic kidney disease, stage 3 unspecified: Secondary | ICD-10-CM | POA: Diagnosis not present

## 2019-10-30 DIAGNOSIS — I10 Essential (primary) hypertension: Secondary | ICD-10-CM | POA: Diagnosis not present

## 2019-10-30 DIAGNOSIS — M6281 Muscle weakness (generalized): Secondary | ICD-10-CM | POA: Diagnosis not present

## 2019-10-30 DIAGNOSIS — R42 Dizziness and giddiness: Secondary | ICD-10-CM | POA: Diagnosis not present

## 2019-10-30 DIAGNOSIS — I674 Hypertensive encephalopathy: Secondary | ICD-10-CM | POA: Diagnosis not present

## 2019-10-30 LAB — BASIC METABOLIC PANEL
Anion gap: 8 (ref 5–15)
BUN: 28 mg/dL — ABNORMAL HIGH (ref 8–23)
CO2: 25 mmol/L (ref 22–32)
Calcium: 8.7 mg/dL — ABNORMAL LOW (ref 8.9–10.3)
Chloride: 104 mmol/L (ref 98–111)
Creatinine, Ser: 1.55 mg/dL — ABNORMAL HIGH (ref 0.61–1.24)
GFR calc Af Amer: 51 mL/min — ABNORMAL LOW (ref >60)
GFR calc non Af Amer: 44 mL/min — ABNORMAL LOW (ref >60)
Glucose, Bld: 198 mg/dL — ABNORMAL HIGH (ref 70–99)
Potassium: 4.4 mmol/L (ref 3.5–5.1)
Sodium: 137 mmol/L (ref 135–145)

## 2019-10-30 LAB — SARS CORONAVIRUS 2 BY RT PCR (HOSPITAL ORDER, PERFORMED IN ~~LOC~~ HOSPITAL LAB): SARS Coronavirus 2: NEGATIVE

## 2019-10-30 LAB — CBC
HCT: 27.8 % — ABNORMAL LOW (ref 39.0–52.0)
Hemoglobin: 8.7 g/dL — ABNORMAL LOW (ref 13.0–17.0)
MCH: 27.3 pg (ref 26.0–34.0)
MCHC: 31.3 g/dL (ref 30.0–36.0)
MCV: 87.1 fL (ref 80.0–100.0)
Platelets: 241 10*3/uL (ref 150–400)
RBC: 3.19 MIL/uL — ABNORMAL LOW (ref 4.22–5.81)
RDW: 19.3 % — ABNORMAL HIGH (ref 11.5–15.5)
WBC: 5.1 10*3/uL (ref 4.0–10.5)
nRBC: 0 % (ref 0.0–0.2)

## 2019-10-30 LAB — GLUCOSE, CAPILLARY
Glucose-Capillary: 156 mg/dL — ABNORMAL HIGH (ref 70–99)
Glucose-Capillary: 190 mg/dL — ABNORMAL HIGH (ref 70–99)
Glucose-Capillary: 199 mg/dL — ABNORMAL HIGH (ref 70–99)

## 2019-10-30 MED ORDER — PYRIDOSTIGMINE BROMIDE 60 MG PO TABS: 30.0000 mg | ORAL_TABLET | Freq: Three times a day (TID) | ORAL | 1 refills | Status: AC

## 2019-10-30 NOTE — Discharge Summary (Signed)
Physician Discharge Summary  Harry [name] IWL:[id number] DOB: 1945-05-29 DOA: 10/20/2019  PCP: [name], MD  Admit date: 10/20/2019 Discharge date: 10/30/2019  Admitted From: [facility].  Disposition:  SNF  Recommendations for Outpatient Follow-up:  1. Follow up with PCP in 1-2 weeks 2. Please obtain BMP/CBC in one week 3. Please follow up with neurology at [facility] center for evaluation of dysautonomias.     Discharge Condition: Guarded.  CODE STATUS: full code.  Diet recommendation: Heart Healthy /  Brief/Interim Summary: 74 year old male with prior history of stage IIIa CKD, essential hypertension, hyperlipidemia, peripheral vascular disease, peripheral neuropathy, CVA, type 2 diabetes mellitus, presents to ED with recurrent syncopal episodes.  Hospital course complicated by orthostatic hypotension, which appears to have resolved at this time. Pt seen and examined, no new complaints. Labs reviewed from this am, unchanged from previsous.    Discharge Diagnoses:  Principal Problem:   Syncope and collapse Active Problems:   Type 2 diabetes mellitus with stage 3 chronic kidney disease, with long-term current use of insulin (HCC)   Essential hypertension   PAD (peripheral artery disease) (HCC)   Hyperlipidemia   CKD (chronic kidney disease), stage III   Dementia without behavioral disturbance ([place])   Palliative care by specialist   DNR (do not resuscitate) discussion  Recurrent syncope probably secondary to fluctuating blood pressure parameters, orthostatic hypotension. CT of the head without intracranial findings.  Patient has multiple chronic microhemorrhages consistent with hypertensive angiopathy. Chest x-ray does not show any acute cardiopulmonary disease. Echocardiogram showed left ventricular ejection fraction of 60 to 65% with no regional wall abnormalities, grade 1 diastolic dysfunction, mildly dilated left atrial size, no aortic stenosis. MRI of the brain did not  show any acute intracranial abnormality or new stroke. EEG ordered does not show any active seizures at this time. Patient continues to have waxing and waning mentation and  his blood pressure also has been very fluctuating. 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  Repeat orthostatic vital signs are negative.  Continue with TED hoses.  Hypertension urgency Blood pressure parameters are better controlled..  Hypomagnesemia Replaced.  Type 2 diabetes mellitus CBG (last 3)  Recent Labs    10/29/19 1622 10/29/19 2127 10/30/19 0743  GLUCAP 158* 252* 156*    A1c is 7, we have stopped lantus as he is having episodes of hypoglycemia. Recommend starting at 5 units of lantus at a later time when his oral intake improves.    Peripheral artery disease Continue with aspirin and Plavix.   Hyperlipidemia Continue with Crestor 5 mg daily.    History of dementia Minimize the use of benzodiazepines.  Palliative care consulted for goals of care discussion. Keep blinds open during the day and reorientation as appropriate.  PT evaluation recommending SNF.    Discharge Instructions  Discharge Instructions    Call MD for:  difficulty breathing, headache or visual disturbances   Complete by: As directed    Call MD for:  persistant nausea and vomiting   Complete by: As directed    Call MD for:  severe uncontrolled pain   Complete by: As directed    Call MD for:  temperature >100.4   Complete by: As directed    Diet - low sodium heart healthy   Complete by: As directed    Diet -  low sodium heart healthy   Complete by: As directed    Discharge instructions   Complete by: As directed    Please follow up with neurology at [facility] center who is specialized in dysautonomias.    Increase activity slowly   Complete by: As directed      Allergies as of 10/30/2019      Reactions   Lipitor [atorvastatin] Other (See Comments)   Myalgia   Statins Other (See Comments)   Myalgia (CAN tolerate Crestor, however)   Pravachol [pravastatin] Rash      Medication List    STOP taking these medications   acetaminophen 500 MG tablet Commonly known as: TYLENOL   Basaglar KwikPen 100 UNIT/ML Replaced by: insulin glargine 100 UNIT/ML injection   cephALEXin 500 MG capsule Commonly known as: KEFLEX   cyanocobalamin 1000 MCG tablet   sertraline 25 MG tablet Commonly known as: ZOLOFT     TAKE these medications   Artificial Tears 1.4 % ophthalmic solution Generic drug: polyvinyl alcohol Place 1 drop into both eyes as needed for dry eyes.   aspirin 81 MG EC tablet Take 1 tablet (81 mg total) by mouth every evening.   carvedilol 25 MG tablet Commonly known as: COREG Take 1 tablet (25 mg total) by mouth 2 (two) times daily with a meal.   clopidogrel 75 MG tablet Commonly known as: PLAVIX Take 1 tablet (75 mg total) by mouth daily.   insulin aspart 100 UNIT/ML injection Commonly known as: novoLOG Inject 0-9 Units into the skin 3 (three) times daily with meals. CBG 121 - 150: 1 unit CBG 151 - 200: 2 units CBG 201 - 250: 3 units CBG 251 - 300: 5 units CBG 301 - 350: 7 units CBG 351 - 400 9 units CBG > 400 call MD and obtain STAT lab verification   insulin glargine 100 UNIT/ML injection Commonly known as: LANTUS Inject 0.08 mLs (8 Units total) into the skin at bedtime. Replaces: Basaglar KwikPen 100 UNIT/ML   pyridostigmine 60 MG tablet Commonly known as: MESTINON Take 0.5 tablets (30 mg total) by mouth every 8 (eight) hours. What changed: when to take this   rosuvastatin 5 MG tablet Commonly known as: CRESTOR Take 1 tablet (5 mg total) by mouth every evening.       Allergies  Allergen Reactions  . Lipitor [Atorvastatin] Other (See Comments)     Myalgia   . Statins Other (See Comments)    Myalgia (CAN tolerate Crestor, however)  . Pravachol [Pravastatin] Rash    Consultations: Neurology.   Procedures/Studies: CT Head Wo Contrast  Result Date: 10/20/2019 CLINICAL DATA:  Dizziness, loss of consciousness EXAM: CT HEAD WITHOUT CONTRAST TECHNIQUE: Contiguous axial images were obtained from the base of the skull through the vertex without intravenous contrast. COMPARISON:  10/12/2019 FINDINGS: Brain: Stable areas of encephalomalacia are seen within the cerebellum, left occipital lobe, bilateral parietal lobes consistent with prior ischemic change. Chronic small vessel ischemic changes are again noted throughout the periventricular white matter, bilateral basal ganglia, bilateral thalami. No evidence of acute infarct or hemorrhage. Lateral ventricles and remaining midline structures are unremarkable. No acute extra-axial fluid collections. No mass effect. Vascular: Stable atherosclerosis.  No hyperdense vessel. Skull: Normal. Negative for fracture or focal lesion. Sinuses/Orbits: No acute finding. Other: None. IMPRESSION: 1. Stable chronic ischemic changes.  No acute intracranial process. Electronically Signed   By: Randa Ngo M.D.   On: 10/20/2019 16:03   CT HEAD WO CONTRAST  Result Date: 10/12/2019  CLINICAL DATA:  Recurrence syncope EXAM: CT HEAD WITHOUT CONTRAST TECHNIQUE: Contiguous axial images were obtained from the base of the skull through the vertex without intravenous contrast. COMPARISON:  08/31/2018 FINDINGS: Brain: Encephalomalacia again noted within the a cerebellar hemispheres bilaterally, left greater than right, as well as the occipital lobes bilaterally, left greater than right, most in keeping with remote infarct. This appears stable since prior examination. Mild parenchymal volume loss is commensurate with the patient's age. Moderate periventricular white matter changes are present likely reflecting the sequela of small  vessel ischemia. Bilateral remote thalamic infarcts are noted. No abnormal intra or extra-axial mass lesion or fluid collection. No abnormal mass effect or midline shift. No evidence of acute intracranial hemorrhage or infarct. Ventricular size is normal. Cerebellum unremarkable. Vascular: Moderate atherosclerotic calcification noted within the carotid siphons. No hyperdense asymmetric vasculature at the skull base. Skull: Intact Sinuses/Orbits: Paranasal sinuses are clear. Orbits are unremarkable. Other: Mastoid air cells and middle ear cavities are clear. IMPRESSION: No evidence of acute intracranial hemorrhage or infarct. Stable remote infarcts. Stable senescent changes. Electronically Signed   By: Fidela Salisbury MD   On: 10/12/2019 18:21   MR BRAIN WO CONTRAST  Result Date: 10/27/2019 CLINICAL DATA:  Mental status change, persistent or worsening. EXAM: MRI HEAD WITHOUT CONTRAST TECHNIQUE: Multiplanar, multiecho pulse sequences of the brain and surrounding structures were obtained without intravenous contrast. COMPARISON:  10/21/2019 FINDINGS: Brain: No acute infarct, mass, midline shift, or extra-axial fluid collection is identified. Scattered T2 hyperintensities in the cerebral white matter bilaterally and extensive T2 hyperintensity in the pons are unchanged and nonspecific but compatible with chronic small vessel ischemic disease. Chronic infarcts are again noted in the cerebellum bilaterally, pons, thalami, occipital lobes (left larger than right), posterior right parietal lobe, and right basal ganglia. There are chronic blood products associated with the thalamic and occipital infarcts, and there are additional scattered chronic microhemorrhages elsewhere in both cerebral hemispheres and in the pons and cerebellum. There is mild-to-moderate cerebral atrophy. Vascular: Major intracranial vascular flow voids are preserved. Skull and upper cervical spine: Unchanged heterogeneously diminished bone marrow T1  signal intensity in the skull, nonspecific. Sinuses/Orbits: Bilateral cataract extraction. Paranasal sinuses and mastoid air cells are clear. Other: None. IMPRESSION: 1. No acute intracranial abnormality. 2. Unchanged extensive chronic ischemia with multiple old infarcts. Electronically Signed   By: Logan Bores M.D.   On: 10/27/2019 21:29   MR BRAIN WO CONTRAST  Result Date: 10/21/2019 CLINICAL DATA:  Recurrent syncope EXAM: MRI HEAD WITHOUT CONTRAST TECHNIQUE: Multiplanar, multiecho pulse sequences of the brain and surrounding structures were obtained without intravenous contrast. COMPARISON:  08/12/2018 FINDINGS: Brain: No acute infarct, acute hemorrhage or extra-axial collection. There are multiple old infarcts, including both occipital lobes in both cerebellar hemispheres. There is multifocal periventricular white matter hyperintensity, most often a result of chronic microvascular ischemia. There is generalized atrophy without lobar predilection. Multiple chronic microhemorrhages in a predominantly central distribution. Normal midline structures. Vascular: Normal flow voids. Skull and upper cervical spine: Normal marrow signal. Sinuses/Orbits: Negative. Other: None. IMPRESSION: 1. No acute intracranial abnormality. 2. Multiple old infarcts, including both occipital lobes and both cerebellar hemispheres. 3. Multiple chronic microhemorrhages in a predominantly central distribution, most consistent with hypertensive angiopathy. Electronically Signed   By: Ulyses Jarred M.D.   On: 10/21/2019 01:32   DG Chest Port 1 View  Result Date: 10/20/2019 CLINICAL DATA:  Altered level of consciousness, hypotension EXAM: PORTABLE CHEST 1 VIEW COMPARISON:  04/12/2019 FINDINGS: The heart size  and mediastinal contours are within normal limits. Both lungs are clear. The visualized skeletal structures are unremarkable. IMPRESSION: No active disease. Electronically Signed   By: Randa Ngo M.D.   On: 10/20/2019 15:33    EEG adult  Result Date: 10/27/2019 Lora Havens, MD     10/27/2019  5:50 PM Patient Name: Harry [name] MRN: [id number] Epilepsy Attending: Lora Havens Referring Physician/Provider: Dr. Eric [place] Date: 10/27/2019 Duration: 23.36 mins Patient history: 74 year old male with episodes of passing out with seizure-like activity.  EEG to evaluate for seizures. Level of alertness: Awake,asleep AEDs during EEG study: None Technical aspects: This EEG study was done with scalp electrodes positioned according to the 10-20 International system of electrode placement. Electrical activity was acquired at a sampling rate of 500Hz  and reviewed with a high frequency filter of 70Hz  and a low frequency filter of 1Hz . EEG data were recorded continuously and digitally stored. Description: The posterior dominant rhythm consists of 8Hz  activity of moderate voltage (25-35 uV) seen predominantly in posterior head regions, symmetric and reactive to eye opening and eye closing. Sleep was characterized by vertex waves, sleep spindles (12 to 14 Hz), maximal frontocentral region.  EEG showed intermittent 3 to 6 Hz theta-delta slowing in left temporal region.  Hyperventilation and photic stimulation were not performed.   ABNORMALITY -Intermittent slow, left temporal region IMPRESSION: This study is suggestive of non specific cortical dysfunction in  left temporal region. No seizures or epileptiform discharges were seen throughout the recording. Lora Havens   ECHOCARDIOGRAM COMPLETE  Result Date: 10/21/2019    ECHOCARDIOGRAM REPORT   Patient Name:   Harry [name] Date of Exam: 10/21/2019 Medical Rec #:  [id number]        Height:       72.0 in Accession #:    [id number]       Weight:       175.0 lb Date of Birth:  [birth year]         BSA:          2.013 m Patient Age:    89 years         BP:           161/83 mmHg Patient Gender: M                HR:           80 bpm. Exam Location:  Inpatient Procedure: 2D Echo, Cardiac  Doppler and Color Doppler Indications:    Syncope  History:        Patient has prior history of Echocardiogram examinations, most                 recent 08/12/2018. Stroke, Signs/Symptoms:Altered Mental Status                 and Syncope; Risk Factors:Hypertension, Diabetes, Dyslipidemia                 and Former Smoker. CKD.  Sonographer:    Dustin Flock Referring Phys: [place]  1. Left ventricular ejection fraction, by estimation, is 60 to 65%. The left ventricle has normal function. The left ventricle has no regional wall motion abnormalities. There is mild left ventricular hypertrophy. Left ventricular diastolic parameters are consistent with Grade I diastolic dysfunction (impaired relaxation).  2. Right ventricular systolic function is normal. The right ventricular size is normal.  3. Left atrial size was mildly dilated.  4. The mitral valve is  normal in structure. Trivial mitral valve regurgitation. No evidence of mitral stenosis.  5. Calcified non coronary cusp . The aortic valve is normal in structure. Aortic valve regurgitation is not visualized. Mild to moderate aortic valve sclerosis/calcification is present, without any evidence of aortic stenosis.  6. The inferior vena cava is normal in size with greater than 50% respiratory variability, suggesting right atrial pressure of 3 mmHg. FINDINGS  Left Ventricle: Left ventricular ejection fraction, by estimation, is 60 to 65%. The left ventricle has normal function. The left ventricle has no regional wall motion abnormalities. The left ventricular internal cavity size was normal in size. There is  mild left ventricular hypertrophy. Left ventricular diastolic parameters are consistent with Grade I diastolic dysfunction (impaired relaxation). Right Ventricle: The right ventricular size is normal. No increase in right ventricular wall thickness. Right ventricular systolic function is normal. Left Atrium: Left atrial size was mildly  dilated. Right Atrium: Right atrial size was normal in size. Pericardium: There is no evidence of pericardial effusion. Mitral Valve: The mitral valve is normal in structure. Normal mobility of the mitral valve leaflets. Trivial mitral valve regurgitation. No evidence of mitral valve stenosis. Tricuspid Valve: The tricuspid valve is normal in structure. Tricuspid valve regurgitation is not demonstrated. No evidence of tricuspid stenosis. Aortic Valve: Calcified non coronary cusp. The aortic valve is normal in structure. Aortic valve regurgitation is not visualized. Mild to moderate aortic valve sclerosis/calcification is present, without any evidence of aortic stenosis. Pulmonic Valve: The pulmonic valve was normal in structure. Pulmonic valve regurgitation is not visualized. No evidence of pulmonic stenosis. Aorta: The aortic root is normal in size and structure. Venous: The inferior vena cava is normal in size with greater than 50% respiratory variability, suggesting right atrial pressure of 3 mmHg. IAS/Shunts: No atrial level shunt detected by color flow Doppler.  LEFT VENTRICLE PLAX 2D LVIDd:         4.50 cm  Diastology LVIDs:         2.80 cm  LV e' lateral:   4.90 cm/s LV PW:         1.30 cm  LV E/e' lateral: 12.2 LV IVS:        1.40 cm  LV e' medial:    3.37 cm/s LVOT diam:     2.40 cm  LV E/e' medial:  17.8 LV SV:         63 LV SV Index:   31 LVOT Area:     4.52 cm  RIGHT VENTRICLE RV S prime:     7.72 cm/s TAPSE (M-mode): 2.4 cm LEFT ATRIUM             Index LA diam:        4.00 cm 1.99 cm/m LA Vol (A2C):   49.8 ml 24.74 ml/m LA Vol (A4C):   34.5 ml 17.14 ml/m LA Biplane Vol: 42.5 ml 21.11 ml/m  AORTIC VALVE LVOT Vmax:   84.90 cm/s LVOT Vmean:  57.000 cm/s LVOT VTI:    0.140 m  AORTA Ao Root diam: 3.60 cm MITRAL VALVE MV Area (PHT): 3.85 cm     SHUNTS MV Decel Time: 197 msec     Systemic VTI:  0.14 m MV E velocity: 60.00 cm/s   Systemic Diam: 2.40 cm MV A velocity: 101.00 cm/s MV E/A ratio:  0.59 Jenkins Rouge MD Electronically signed by Jenkins Rouge MD Signature Date/Time: 10/21/2019/10:05:04 AM    Final        Subjective: No  new complaints.   Discharge Exam: Vitals:   10/30/19 0600 10/30/19 0800  BP: 139/68 132/66  Pulse: 69 67  Resp: 19 18  Temp: 98 F (36.7 C) 98.3 F (36.8 C)  SpO2: 100%    Vitals:   10/29/19 1937 10/29/19 2318 10/30/19 0600 10/30/19 0800  BP: (!) 144/84 (!) 146/86 139/68 132/66  Pulse: 77 75 69 67  Resp: 18 20 19 18   Temp: 98.1 F (36.7 C) 98.4 F (36.9 C) 98 F (36.7 C) 98.3 F (36.8 C)  TempSrc: Oral Oral Oral Oral  SpO2: 100% 100% 100%   Weight:        General: Pt is alert, awake, not in acute distress Cardiovascular: RRR, S1/S2 +, no rubs, no gallops Respiratory: CTA bilaterally, no wheezing, no rhonchi Abdominal: Soft, NT, ND, bowel sounds + Extremities: no edema, no cyanosis    The results of significant diagnostics from this hospitalization (including imaging, microbiology, ancillary and laboratory) are listed below for reference.     Microbiology: Recent Results (from the past 240 hour(s))  SARS Coronavirus 2 by RT PCR (hospital order, performed in [facility] hospital lab) Nasopharyngeal Nasopharyngeal Swab     Status: None   Collection Time: 10/20/19  4:17 PM   Specimen: Nasopharyngeal Swab  Result Value Ref Range Status   SARS Coronavirus 2 NEGATIVE NEGATIVE Final    Comment: (NOTE) SARS-CoV-2 target nucleic acids are NOT DETECTED.  The SARS-CoV-2 RNA is generally detectable in upper and lower respiratory specimens during the acute phase of infection. The lowest concentration of SARS-CoV-2 viral copies this assay can detect is 250 copies / mL. A negative result does not preclude SARS-CoV-2 infection and should not be used as the sole basis for treatment or other patient management decisions.  A negative result may occur with improper specimen collection / handling, submission of specimen other than nasopharyngeal swab,  presence of viral mutation(s) within the areas targeted by this assay, and inadequate number of viral copies (<250 copies / mL). A negative result must be combined with clinical observations, patient history, and epidemiological information.  Fact Sheet for Patients:   StrictlyIdeas.no  Fact Sheet for Healthcare Providers: BankingDealers.co.za  This test is not yet approved or  cleared by the [place] FDA and has been authorized for detection and/or diagnosis of SARS-CoV-2 by FDA under an Emergency Use Authorization (EUA).  This EUA will remain in effect (meaning this test can be used) for the duration of the COVID-19 declaration under Section 564(b)(1) of the Act, 21 U.S.C. section 360bbb-3(b)(1), unless the authorization is terminated or revoked sooner.  Performed at [facility], [place] [address], [place], [place] [address]   SARS Coronavirus 2 by RT PCR (hospital order, performed in [facility] hospital lab) Nasopharyngeal Nasopharyngeal Swab     Status: None   Collection Time: 10/25/19  9:31 AM   Specimen: Nasopharyngeal Swab  Result Value Ref Range Status   SARS Coronavirus 2 NEGATIVE NEGATIVE Final    Comment: (NOTE) SARS-CoV-2 target nucleic acids are NOT DETECTED.  The SARS-CoV-2 RNA is generally detectable in upper and lower respiratory specimens during the acute phase of infection. The lowest concentration of SARS-CoV-2 viral copies this assay can detect is 250 copies / mL. A negative result does not preclude SARS-CoV-2 infection and should not be used as the sole basis for treatment or other patient management decisions.  A negative result may occur with improper specimen collection / handling, submission of specimen other than nasopharyngeal swab,  presence of viral mutation(s) within the areas targeted by this assay, and inadequate number of viral copies (<250 copies / mL). A negative result must be combined  with clinical observations, patient history, and epidemiological information.  Fact Sheet for Patients:   StrictlyIdeas.no  Fact Sheet for Healthcare Providers: BankingDealers.co.za  This test is not yet approved or  cleared by the [place] FDA and has been authorized for detection and/or diagnosis of SARS-CoV-2 by FDA under an Emergency Use Authorization (EUA).  This EUA will remain in effect (meaning this test can be used) for the duration of the COVID-19 declaration under Section 564(b)(1) of the Act, 21 U.S.C. section 360bbb-3(b)(1), unless the authorization is terminated or revoked sooner.  Performed at [facility], [place] [address], [place], [place] [address]      Labs: BNP (last 3 results) No results for input(s): BNP in the last 8760 hours. Basic Metabolic Panel: Recent Labs  Lab 10/24/19 0118 10/27/19 0201 10/30/19 0130  NA 137 137 137  K 4.1 4.4 4.4  CL 105 105 104  CO2 24 24 25   GLUCOSE 88 172* 198*  BUN 26* 25* 28*  CREATININE 1.56* 1.59* 1.55*  CALCIUM 8.5* 8.4* 8.7*  MG 1.9 1.7  --    Liver Function Tests: No results for input(s): AST, ALT, ALKPHOS, BILITOT, PROT, ALBUMIN in the last 168 hours. No results for input(s): LIPASE, AMYLASE in the last 168 hours. No results for input(s): AMMONIA in the last 168 hours. CBC: Recent Labs  Lab 10/27/19 0201 10/30/19 0130  WBC 4.7 5.1  HGB 8.7* 8.7*  HCT 28.2* 27.8*  MCV 87.9 87.1  PLT 232 241   Cardiac Enzymes: No results for input(s): CKTOTAL, CKMB, CKMBINDEX, TROPONINI in the last 168 hours. BNP: Invalid input(s): POCBNP CBG: Recent Labs  Lab 10/29/19 0837 10/29/19 1134 10/29/19 1622 10/29/19 2127 10/30/19 0743  GLUCAP 69* 139* 158* 252* 156*   D-Dimer No results for input(s): DDIMER in the last 72 hours. Hgb A1c No results for input(s): HGBA1C in the last 72 hours. Lipid Profile No results for input(s): CHOL, HDL, LDLCALC, TRIG,  CHOLHDL, LDLDIRECT in the last 72 hours. Thyroid function studies No results for input(s): TSH, T4TOTAL, T3FREE, THYROIDAB in the last 72 hours.  Invalid input(s): FREET3 Anemia work up No results for input(s): VITAMINB12, FOLATE, FERRITIN, TIBC, IRON, RETICCTPCT in the last 72 hours. Urinalysis    Component Value Date/Time   COLORURINE YELLOW 10/20/2019 1701   APPEARANCEUR CLEAR 10/20/2019 1701   LABSPEC 1.015 10/20/2019 1701   PHURINE 6.0 10/20/2019 1701   GLUCOSEU 50 (A) 10/20/2019 1701   HGBUR SMALL (A) 10/20/2019 1701   BILIRUBINUR NEGATIVE 10/20/2019 1701   KETONESUR NEGATIVE 10/20/2019 1701   PROTEINUR NEGATIVE 10/20/2019 1701   UROBILINOGEN 0.2 10/09/2014 2112   NITRITE NEGATIVE 10/20/2019 1701   LEUKOCYTESUR NEGATIVE 10/20/2019 1701   Sepsis Labs Invalid input(s): PROCALCITONIN,  WBC,  LACTICIDVEN Microbiology Recent Results (from the past 240 hour(s))  SARS Coronavirus 2 by RT PCR (hospital order, performed in [place] hospital lab) Nasopharyngeal Nasopharyngeal Swab     Status: None   Collection Time: 10/20/19  4:17 PM   Specimen: Nasopharyngeal Swab  Result Value Ref Range Status   SARS Coronavirus 2 NEGATIVE NEGATIVE Final    Comment: (NOTE) SARS-CoV-2 target nucleic acids are NOT DETECTED.  The SARS-CoV-2 RNA is generally detectable in upper and lower respiratory specimens during the acute phase of infection. The lowest concentration of SARS-CoV-2 viral copies this  assay can detect is 250 copies / mL. A negative result does not preclude SARS-CoV-2 infection and should not be used as the sole basis for treatment or other patient management decisions.  A negative result may occur with improper specimen collection / handling, submission of specimen other than nasopharyngeal swab, presence of viral mutation(s) within the areas targeted by this assay, and inadequate number of viral copies (<250 copies / mL). A negative result must be combined with  clinical observations, patient history, and epidemiological information.  Fact Sheet for Patients:   StrictlyIdeas.no  Fact Sheet for Healthcare Providers: BankingDealers.co.za  This test is not yet approved or  cleared by the [place] FDA and has been authorized for detection and/or diagnosis of SARS-CoV-2 by FDA under an Emergency Use Authorization (EUA).  This EUA will remain in effect (meaning this test can be used) for the duration of the COVID-19 declaration under Section 564(b)(1) of the Act, 21 U.S.C. section 360bbb-3(b)(1), unless the authorization is terminated or revoked sooner.  Performed at [facility], [place] [address] [place], [place] [address]   SARS Coronavirus 2 by RT PCR (hospital order, performed in [facility] hospital lab) Nasopharyngeal Nasopharyngeal Swab     Status: None   Collection Time: 10/25/19  9:31 AM   Specimen: Nasopharyngeal Swab  Result Value Ref Range Status   SARS Coronavirus 2 NEGATIVE NEGATIVE Final    Comment: (NOTE) SARS-CoV-2 target nucleic acids are NOT DETECTED.  The SARS-CoV-2 RNA is generally detectable in upper and lower respiratory specimens during the acute phase of infection. The lowest concentration of SARS-CoV-2 viral copies this assay can detect is 250 copies / mL. A negative result does not preclude SARS-CoV-2 infection and should not be used as the sole basis for treatment or other patient management decisions.  A negative result may occur with improper specimen collection / handling, submission of specimen other than nasopharyngeal swab, presence of viral mutation(s) within the areas targeted by this assay, and inadequate number of viral copies (<250 copies / mL). A negative result must be combined with clinical observations, patient history, and epidemiological information.  Fact Sheet for Patients:   StrictlyIdeas.no  Fact Sheet for  Healthcare Providers: BankingDealers.co.za  This test is not yet approved or  cleared by the [place] FDA and has been authorized for detection and/or diagnosis of SARS-CoV-2 by FDA under an Emergency Use Authorization (EUA).  This EUA will remain in effect (meaning this test can be used) for the duration of the COVID-19 declaration under Section 564(b)(1) of the Act, 21 U.S.C. section 360bbb-3(b)(1), unless the authorization is terminated or revoked sooner.  Performed at [facility], [place] [address], [place],  [address]      Time coordinating discharge: Over 32 minutes.   SIGNED:   Hosie Poisson, MD  [facility] 10/30/2019, 8:38 AM

## 2019-10-30 NOTE — TOC Transition Note (Signed)
Transition of Care [facility]) - CM/SW Discharge Note   Patient Details  Name: Harry [name] MRN: [id number] Date of Birth: [birth year]  Transition of [facility]) CM/SW Contact:  Joanne Chars, LCSW Phone Number: 10/30/2019, 10:22 AM   Clinical Narrative:   Pt going to Room 107 B.  RN call report to [phone].    Final next level of care: Skilled Nursing Facility Barriers to Discharge: Barriers Resolved   Patient Goals and CMS Choice Patient states their goals for this hospitalization and ongoing recovery are:: To get stronger CMS Medicare.gov Compare Post Acute Care list provided to:: Patient Choice offered to / list presented to : Patient  Discharge Placement PASRR number recieved: 10/22/19            Patient chooses bed at: [facility] Patient to be transferred to facility by: [place] Name of family member notified: [name], daughter Patient and family notified of of transfer: 10/30/19  Discharge Plan and Services                                     Social Determinants of Health (SDOH) Interventions     Readmission Risk Interventions Readmission Risk Prevention Plan 09/02/2018 08/14/2018  Transportation Screening Complete Complete  PCP or Specialist Appt within 3-5 Days - Complete  HRI or [place] - Not Complete  HRI or Home Care Consult comments - pt discharging to SNF  Social Work Consult for [place] Planning/Counseling - Not Complete  SW consult not completed comments - no needs  Palliative Care Screening - Not Applicable  Medication Review Press [employer]) - Referral to Pharmacy  PCP or Specialist appointment within 3-5 days of discharge Complete -  SW Recovery Care/Counseling Consult Complete -  Palliative Care Screening Not Applicable -  Skilled Nursing Facility Complete -  Some recent data might be hidden

## 2019-10-30 NOTE — Progress Notes (Signed)
Report called to [facility]

## 2019-11-03 ENCOUNTER — Other Ambulatory Visit: Payer: Self-pay | Admitting: *Deleted

## 2019-11-03 DIAGNOSIS — E1143 Type 2 diabetes mellitus with diabetic autonomic (poly)neuropathy: Secondary | ICD-10-CM | POA: Diagnosis not present

## 2019-11-03 DIAGNOSIS — I951 Orthostatic hypotension: Secondary | ICD-10-CM | POA: Diagnosis not present

## 2019-11-03 DIAGNOSIS — I10 Essential (primary) hypertension: Secondary | ICD-10-CM | POA: Diagnosis not present

## 2019-11-03 DIAGNOSIS — I739 Peripheral vascular disease, unspecified: Secondary | ICD-10-CM | POA: Diagnosis not present

## 2019-11-03 DIAGNOSIS — N1831 Chronic kidney disease, stage 3a: Secondary | ICD-10-CM | POA: Diagnosis not present

## 2019-11-03 NOTE — Patient Outreach (Signed)
Member screened for potential [facility] Care Management needs as a benefit of [place] Medicare.  [facility] Care Management hospital liaison made writer aware member transitioned to [facility].   Communication sent to [facility] SNF SW to collaborate about transition plans and potential [facility] Care Management needs.   Will continue to follow.   Marthenia Rolling, MSN-Ed, RN,BSN [place] Acute Care Coordinator [phone] [facility]) [phone]  (Toll free office)

## 2019-11-04 ENCOUNTER — Other Ambulatory Visit: Payer: Self-pay | Admitting: *Deleted

## 2019-11-04 DIAGNOSIS — R0989 Other specified symptoms and signs involving the circulatory and respiratory systems: Secondary | ICD-10-CM | POA: Diagnosis not present

## 2019-11-04 DIAGNOSIS — R55 Syncope and collapse: Secondary | ICD-10-CM | POA: Diagnosis not present

## 2019-11-04 DIAGNOSIS — R05 Cough: Secondary | ICD-10-CM | POA: Diagnosis not present

## 2019-11-04 NOTE — Patient Outreach (Signed)
THN Post- Acute Care Coordinator follow up.   Update received from [place] on 11/03/19. Facility recommends long term care. However, not sure what daughter is agreeable to.  Will continue to follow for transition plans and potential [facility] Care Management needs.  Marthenia Rolling, MSN-Ed, RN,BSN [place] Acute Care Coordinator [phone] [facility]) [phone]  (Toll free office)

## 2019-11-11 DIAGNOSIS — R262 Difficulty in walking, not elsewhere classified: Secondary | ICD-10-CM | POA: Diagnosis not present

## 2019-11-11 DIAGNOSIS — H548 Legal blindness, as defined in USA: Secondary | ICD-10-CM | POA: Diagnosis not present

## 2019-11-11 DIAGNOSIS — R296 Repeated falls: Secondary | ICD-10-CM | POA: Diagnosis not present

## 2019-11-11 DIAGNOSIS — H04123 Dry eye syndrome of bilateral lacrimal glands: Secondary | ICD-10-CM | POA: Diagnosis not present

## 2019-11-11 DIAGNOSIS — R5381 Other malaise: Secondary | ICD-10-CM | POA: Diagnosis not present

## 2019-11-13 ENCOUNTER — Other Ambulatory Visit: Payer: Self-pay

## 2019-11-13 ENCOUNTER — Non-Acute Institutional Stay: Payer: Medicare Other | Admitting: Hospice

## 2019-11-13 DIAGNOSIS — Z515 Encounter for palliative care: Secondary | ICD-10-CM

## 2019-11-13 DIAGNOSIS — R55 Syncope and collapse: Secondary | ICD-10-CM
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
## 2019-11-17 DIAGNOSIS — R42 Dizziness and giddiness: Secondary | ICD-10-CM | POA: Diagnosis not present

## 2019-11-17 DIAGNOSIS — F0390 Unspecified dementia without behavioral disturbance: Secondary | ICD-10-CM | POA: Diagnosis not present

## 2019-11-17 DIAGNOSIS — I951 Orthostatic hypotension: Secondary | ICD-10-CM | POA: Diagnosis not present

## 2019-11-17 DIAGNOSIS — R441 Visual hallucinations: Secondary | ICD-10-CM | POA: Diagnosis not present

## 2019-11-23 DIAGNOSIS — R4182 Altered mental status, unspecified: Secondary | ICD-10-CM | POA: Diagnosis not present

## 2019-11-23 DIAGNOSIS — F0390 Unspecified dementia without behavioral disturbance: Secondary | ICD-10-CM | POA: Diagnosis not present

## 2019-11-23 DIAGNOSIS — Z20822 Contact with and (suspected) exposure to covid-19: Secondary | ICD-10-CM | POA: Diagnosis not present

## 2019-11-23 DIAGNOSIS — R55 Syncope and collapse: Secondary | ICD-10-CM | POA: Diagnosis not present

## 2019-11-23 DIAGNOSIS — I951 Orthostatic hypotension: Secondary | ICD-10-CM | POA: Diagnosis not present

## 2019-11-23 DIAGNOSIS — F05 Delirium due to known physiological condition: Secondary | ICD-10-CM | POA: Diagnosis not present

## 2019-11-23 DIAGNOSIS — F332 Major depressive disorder, recurrent severe without psychotic features: Secondary | ICD-10-CM | POA: Diagnosis not present

## 2019-12-02 ENCOUNTER — Other Ambulatory Visit: Payer: Self-pay | Admitting: *Deleted

## 2019-12-02 DIAGNOSIS — R5383 Other fatigue: Secondary | ICD-10-CM | POA: Diagnosis not present

## 2019-12-02 DIAGNOSIS — F0391 Unspecified dementia with behavioral disturbance: Secondary | ICD-10-CM | POA: Diagnosis not present

## 2019-12-02 DIAGNOSIS — F411 Generalized anxiety disorder: Secondary | ICD-10-CM | POA: Diagnosis not present

## 2019-12-02 DIAGNOSIS — R451 Restlessness and agitation: Secondary | ICD-10-CM | POA: Diagnosis not present

## 2019-12-02 NOTE — Patient Outreach (Addendum)
THN  Post- Acute Care Coordinator follow up. Member screened for potential [facility] Care Management needs as a benefit of [place] Medicare.  Communication sent to [facility] SNF SW to request an update.  Facility SW reports member's daughter applied for Medicaid on yesterday. Unsure if Harry [name] will remain at facility for long term care or return home.   Will plan follow up regarding transition plans.  Marthenia Rolling, MSN-Ed, RN,BSN [place] Acute Care Coordinator [phone] [facility]) [phone]  (Toll free office)

## 2019-12-04 DIAGNOSIS — F0390 Unspecified dementia without behavioral disturbance: Secondary | ICD-10-CM | POA: Diagnosis not present

## 2019-12-04 DIAGNOSIS — R627 Adult failure to thrive: Secondary | ICD-10-CM | POA: Diagnosis not present

## 2019-12-04 DIAGNOSIS — R262 Difficulty in walking, not elsewhere classified: Secondary | ICD-10-CM | POA: Diagnosis not present

## 2019-12-04 DIAGNOSIS — R5383 Other fatigue: Secondary | ICD-10-CM | POA: Diagnosis not present

## 2019-12-04 DIAGNOSIS — R110 Nausea: Secondary | ICD-10-CM | POA: Diagnosis not present

## 2019-12-04 DIAGNOSIS — F332 Major depressive disorder, recurrent severe without psychotic features: Secondary | ICD-10-CM | POA: Diagnosis not present

## 2019-12-04 DIAGNOSIS — H269 Unspecified cataract: Secondary | ICD-10-CM | POA: Diagnosis not present

## 2019-12-04 DIAGNOSIS — N189 Chronic kidney disease, unspecified: Secondary | ICD-10-CM | POA: Diagnosis not present

## 2019-12-04 DIAGNOSIS — M6281 Muscle weakness (generalized): Secondary | ICD-10-CM | POA: Diagnosis not present

## 2019-12-04 DIAGNOSIS — F0391 Unspecified dementia with behavioral disturbance: Secondary | ICD-10-CM | POA: Diagnosis not present

## 2019-12-04 DIAGNOSIS — N1832 Chronic kidney disease, stage 3b: Secondary | ICD-10-CM | POA: Diagnosis not present

## 2019-12-04 DIAGNOSIS — R296 Repeated falls: Secondary | ICD-10-CM | POA: Diagnosis not present

## 2019-12-04 DIAGNOSIS — R5381 Other malaise: Secondary | ICD-10-CM | POA: Diagnosis not present

## 2019-12-04 DIAGNOSIS — R55 Syncope and collapse: Secondary | ICD-10-CM | POA: Diagnosis not present

## 2019-12-04 DIAGNOSIS — G9009 Other idiopathic peripheral autonomic neuropathy: Secondary | ICD-10-CM | POA: Diagnosis not present

## 2019-12-04 DIAGNOSIS — Z20822 Contact with and (suspected) exposure to covid-19: Secondary | ICD-10-CM | POA: Diagnosis not present

## 2019-12-04 DIAGNOSIS — I10 Essential (primary) hypertension: Secondary | ICD-10-CM | POA: Diagnosis not present

## 2019-12-04 DIAGNOSIS — E785 Hyperlipidemia, unspecified: Secondary | ICD-10-CM | POA: Diagnosis not present

## 2019-12-04 DIAGNOSIS — I674 Hypertensive encephalopathy: Secondary | ICD-10-CM | POA: Diagnosis not present

## 2019-12-04 DIAGNOSIS — I739 Peripheral vascular disease, unspecified: Secondary | ICD-10-CM | POA: Diagnosis not present

## 2019-12-04 DIAGNOSIS — I951 Orthostatic hypotension: Secondary | ICD-10-CM | POA: Diagnosis not present

## 2019-12-04 DIAGNOSIS — R42 Dizziness and giddiness: Secondary | ICD-10-CM | POA: Diagnosis not present

## 2019-12-04 DIAGNOSIS — N183 Chronic kidney disease, stage 3 unspecified: Secondary | ICD-10-CM | POA: Diagnosis not present

## 2019-12-04 DIAGNOSIS — I639 Cerebral infarction, unspecified: Secondary | ICD-10-CM | POA: Diagnosis not present

## 2019-12-04 DIAGNOSIS — R441 Visual hallucinations: Secondary | ICD-10-CM | POA: Diagnosis not present

## 2019-12-04 DIAGNOSIS — H548 Legal blindness, as defined in USA: Secondary | ICD-10-CM | POA: Diagnosis not present

## 2019-12-04 DIAGNOSIS — F05 Delirium due to known physiological condition: Secondary | ICD-10-CM | POA: Diagnosis not present

## 2019-12-04 DIAGNOSIS — E1122 Type 2 diabetes mellitus with diabetic chronic kidney disease: Secondary | ICD-10-CM | POA: Diagnosis not present

## 2019-12-04 DIAGNOSIS — N1831 Chronic kidney disease, stage 3a: Secondary | ICD-10-CM | POA: Diagnosis not present

## 2019-12-04 DIAGNOSIS — R4182 Altered mental status, unspecified: Secondary | ICD-10-CM | POA: Diagnosis not present

## 2019-12-07 DIAGNOSIS — R5383 Other fatigue: Secondary | ICD-10-CM | POA: Diagnosis not present

## 2019-12-07 DIAGNOSIS — F332 Major depressive disorder, recurrent severe without psychotic features: Secondary | ICD-10-CM | POA: Diagnosis not present

## 2019-12-07 DIAGNOSIS — I951 Orthostatic hypotension: Secondary | ICD-10-CM | POA: Diagnosis not present

## 2019-12-07 DIAGNOSIS — R627 Adult failure to thrive: Secondary | ICD-10-CM | POA: Diagnosis not present

## 2019-12-07 DIAGNOSIS — F0391 Unspecified dementia with behavioral disturbance: Secondary | ICD-10-CM | POA: Diagnosis not present

## 2019-12-07 DIAGNOSIS — R42 Dizziness and giddiness: Secondary | ICD-10-CM | POA: Diagnosis not present

## 2019-12-22 DIAGNOSIS — I10 Essential (primary) hypertension: Secondary | ICD-10-CM | POA: Diagnosis not present

## 2019-12-22 DIAGNOSIS — F0390 Unspecified dementia without behavioral disturbance: Secondary | ICD-10-CM | POA: Diagnosis not present

## 2019-12-22 DIAGNOSIS — I739 Peripheral vascular disease, unspecified: Secondary | ICD-10-CM | POA: Diagnosis not present

## 2019-12-22 DIAGNOSIS — E1122 Type 2 diabetes mellitus with diabetic chronic kidney disease: Secondary | ICD-10-CM | POA: Diagnosis not present

## 2019-12-22 DIAGNOSIS — N1831 Chronic kidney disease, stage 3a: Secondary | ICD-10-CM | POA: Diagnosis not present

## 2019-12-22 DIAGNOSIS — R296 Repeated falls: Secondary | ICD-10-CM | POA: Diagnosis not present

## 2019-12-22 DIAGNOSIS — I951 Orthostatic hypotension: Secondary | ICD-10-CM | POA: Diagnosis not present

## 2019-12-22 DIAGNOSIS — F332 Major depressive disorder, recurrent severe without psychotic features: Secondary | ICD-10-CM | POA: Diagnosis not present

## 2019-12-22 DIAGNOSIS — M6281 Muscle weakness (generalized): Secondary | ICD-10-CM | POA: Diagnosis not present

## 2019-12-22 DIAGNOSIS — R55 Syncope and collapse: Secondary | ICD-10-CM | POA: Diagnosis not present

## 2019-12-23 ENCOUNTER — Other Ambulatory Visit: Payer: Self-pay | Admitting: *Deleted

## 2019-12-23 NOTE — Patient Outreach (Signed)
THN Post- Acute Care Coordinator follow up. Member screened for potential [facility] Care Management needs as a benefit of [place] Medicare.  Communication sent to [facility] SNF SW to request update.    Marthenia Rolling, MSN-Ed, RN,BSN [place] Acute Care Coordinator [phone] [facility]) [phone]  (Toll free office)

## 2019-12-24 ENCOUNTER — Other Ambulatory Visit: Payer: Self-pay | Admitting: *Deleted

## 2019-12-24 NOTE — Patient Outreach (Signed)
THN Post- Acute Care Coordinator follow up. Member screened for potential [facility] Care Management needs as a benefit of [place] Medicare.  Update received from [place] indicating most recent SNF appeal was upheld. Therefore, member will transition to home soon. Writer to follow up with daughter/ DPR Harry [name] to discuss [facility] services.   Telephone call made to [name] (daughter/DPR) [phone]. Patient identifiers confirmed.   Harry [name] endorses that [name] will be discharged from SNF due to upheld appeal. She also states that member's room is upstairs and he cannot walk up the stairs. States member does not qualify for Medicaid. However, she did not inquire about facility Medicaid. Encouraged her to do so.   Explored other options with Harry [name]. States there is no room for member to move downstairs in the home. Harry [name] reports [name] is not ready for hospice/palliative nor is he interested. States [facility] is not an option for them due to not having Medicaid. He does not have 24/7 assistance at home.   Discussed that the safest option is that [name] stay long term in facility. Encouraged Harry [name] to discuss this further with facility.   Harry [name] is agreeable to [place] Management services. Harry [name] states [name] is supposed to discharge from SNF today.   Will plan to make [place] Management RNCM referral for complex case management and [facility] LCSW referral for community resources.   Marthenia Rolling, MSN-Ed, RN,BSN [place] Acute Care Coordinator [phone] [facility]) [phone]  (Toll free office)

## 2020-01-04 DIAGNOSIS — R55 Syncope and collapse: Secondary | ICD-10-CM | POA: Diagnosis not present

## 2020-01-04 DIAGNOSIS — I951 Orthostatic hypotension: Secondary | ICD-10-CM | POA: Diagnosis not present

## 2020-01-04 DIAGNOSIS — M6281 Muscle weakness (generalized): Secondary | ICD-10-CM | POA: Diagnosis not present

## 2020-01-05 DIAGNOSIS — R5381 Other malaise: Secondary | ICD-10-CM | POA: Diagnosis not present

## 2020-01-05 DIAGNOSIS — R55 Syncope and collapse: Secondary | ICD-10-CM | POA: Diagnosis not present

## 2020-01-05 DIAGNOSIS — I951 Orthostatic hypotension: Secondary | ICD-10-CM | POA: Diagnosis not present

## 2020-01-05 DIAGNOSIS — R627 Adult failure to thrive: Secondary | ICD-10-CM | POA: Diagnosis not present

## 2020-01-05 DIAGNOSIS — M6281 Muscle weakness (generalized): Secondary | ICD-10-CM | POA: Diagnosis not present

## 2020-01-05 DIAGNOSIS — F0391 Unspecified dementia with behavioral disturbance: Secondary | ICD-10-CM | POA: Diagnosis not present

## 2020-01-05 DIAGNOSIS — R42 Dizziness and giddiness: Secondary | ICD-10-CM | POA: Diagnosis not present

## 2020-01-06 DIAGNOSIS — R55 Syncope and collapse: Secondary | ICD-10-CM | POA: Diagnosis not present

## 2020-01-06 DIAGNOSIS — I951 Orthostatic hypotension: Secondary | ICD-10-CM | POA: Diagnosis not present

## 2020-01-06 DIAGNOSIS — M6281 Muscle weakness (generalized): Secondary | ICD-10-CM | POA: Diagnosis not present

## 2020-01-07 ENCOUNTER — Other Ambulatory Visit: Payer: Self-pay | Admitting: *Deleted

## 2020-01-07 DIAGNOSIS — I10 Essential (primary) hypertension: Secondary | ICD-10-CM | POA: Diagnosis not present

## 2020-01-07 DIAGNOSIS — R627 Adult failure to thrive: Secondary | ICD-10-CM | POA: Diagnosis not present

## 2020-01-07 DIAGNOSIS — F0391 Unspecified dementia with behavioral disturbance: Secondary | ICD-10-CM | POA: Diagnosis not present

## 2020-01-07 DIAGNOSIS — R42 Dizziness and giddiness: Secondary | ICD-10-CM | POA: Diagnosis not present

## 2020-01-07 DIAGNOSIS — M6281 Muscle weakness (generalized): Secondary | ICD-10-CM | POA: Diagnosis not present

## 2020-01-07 DIAGNOSIS — N1832 Chronic kidney disease, stage 3b: Secondary | ICD-10-CM | POA: Diagnosis not present

## 2020-01-07 DIAGNOSIS — R55 Syncope and collapse: Secondary | ICD-10-CM | POA: Diagnosis not present

## 2020-01-07 DIAGNOSIS — R5381 Other malaise: Secondary | ICD-10-CM | POA: Diagnosis not present

## 2020-01-07 DIAGNOSIS — I951 Orthostatic hypotension: Secondary | ICD-10-CM | POA: Diagnosis not present

## 2020-01-07 NOTE — Patient Outreach (Signed)
THN Post- Acute Care Coordinator follow up. Member screened for potential [facility] Care Management needs as a benefit of [place] Medicare.  Mr. Rostro remains at [facility] SNF. Update received from SNF [place] indicates she has had in depth discussions with daughter [name] to discuss alternate transition plans such as LTC and ALF placement. Per SW, member does not qualify for Medicaid. His income is 2,000 a month. However, it was discussed that ALF be paid privately with supplement from daughter and son-in-law. Daughter spoke Child psychotherapist with [facility] about details. Per SNF SW, daughter decided to take member home rather than private pay ALF.   Mr. Wickens had orthostatic hypotension during stair training with therapy today. SNF SW reports daughter plans to purchase a stair lift for home.  Writer to follow up next week with SNF SW regarding transition plans for [name].   Marthenia Rolling, MSN-Ed, RN,BSN [place] Acute Care Coordinator [phone] [name]) [phone]  (Toll free office)

## 2020-01-08 DIAGNOSIS — M6281 Muscle weakness (generalized): Secondary | ICD-10-CM | POA: Diagnosis not present

## 2020-01-08 DIAGNOSIS — N189 Chronic kidney disease, unspecified: Secondary | ICD-10-CM | POA: Diagnosis not present

## 2020-01-08 DIAGNOSIS — I10 Essential (primary) hypertension: Secondary | ICD-10-CM | POA: Diagnosis not present

## 2020-01-08 DIAGNOSIS — R55 Syncope and collapse: Secondary | ICD-10-CM | POA: Diagnosis not present

## 2020-01-08 DIAGNOSIS — I951 Orthostatic hypotension: Secondary | ICD-10-CM | POA: Diagnosis not present

## 2020-01-11 DIAGNOSIS — I951 Orthostatic hypotension: Secondary | ICD-10-CM | POA: Diagnosis not present

## 2020-01-11 DIAGNOSIS — M6281 Muscle weakness (generalized): Secondary | ICD-10-CM | POA: Diagnosis not present

## 2020-01-11 DIAGNOSIS — R42 Dizziness and giddiness: Secondary | ICD-10-CM | POA: Diagnosis not present

## 2020-01-11 DIAGNOSIS — R55 Syncope and collapse: Secondary | ICD-10-CM | POA: Diagnosis not present

## 2020-01-11 DIAGNOSIS — R110 Nausea: Secondary | ICD-10-CM | POA: Diagnosis not present

## 2020-01-11 DIAGNOSIS — H548 Legal blindness, as defined in USA: Secondary | ICD-10-CM | POA: Diagnosis not present

## 2020-01-11 DIAGNOSIS — H269 Unspecified cataract: Secondary | ICD-10-CM | POA: Diagnosis not present

## 2020-01-11 DIAGNOSIS — F0391 Unspecified dementia with behavioral disturbance: Secondary | ICD-10-CM | POA: Diagnosis not present

## 2020-01-11 DIAGNOSIS — I10 Essential (primary) hypertension: Secondary | ICD-10-CM | POA: Diagnosis not present

## 2020-01-12 DIAGNOSIS — M6281 Muscle weakness (generalized): Secondary | ICD-10-CM | POA: Diagnosis not present

## 2020-01-12 DIAGNOSIS — R55 Syncope and collapse: Secondary | ICD-10-CM | POA: Diagnosis not present

## 2020-01-12 DIAGNOSIS — I951 Orthostatic hypotension: Secondary | ICD-10-CM | POA: Diagnosis not present

## 2020-01-13 DIAGNOSIS — R55 Syncope and collapse: Secondary | ICD-10-CM | POA: Diagnosis not present

## 2020-01-13 DIAGNOSIS — I951 Orthostatic hypotension: Secondary | ICD-10-CM | POA: Diagnosis not present

## 2020-01-13 DIAGNOSIS — M6281 Muscle weakness (generalized): Secondary | ICD-10-CM | POA: Diagnosis not present

## 2020-01-14 DIAGNOSIS — F0391 Unspecified dementia with behavioral disturbance: Secondary | ICD-10-CM | POA: Diagnosis not present

## 2020-01-14 DIAGNOSIS — I10 Essential (primary) hypertension: Secondary | ICD-10-CM | POA: Diagnosis not present

## 2020-01-14 DIAGNOSIS — I951 Orthostatic hypotension: Secondary | ICD-10-CM | POA: Diagnosis not present

## 2020-01-14 DIAGNOSIS — M6281 Muscle weakness (generalized): Secondary | ICD-10-CM | POA: Diagnosis not present

## 2020-01-14 DIAGNOSIS — R55 Syncope and collapse: Secondary | ICD-10-CM | POA: Diagnosis not present

## 2020-01-14 DIAGNOSIS — R42 Dizziness and giddiness: Secondary | ICD-10-CM | POA: Diagnosis not present

## 2020-01-15 DIAGNOSIS — M6281 Muscle weakness (generalized): Secondary | ICD-10-CM | POA: Diagnosis not present

## 2020-01-15 DIAGNOSIS — R55 Syncope and collapse: Secondary | ICD-10-CM | POA: Diagnosis not present

## 2020-01-15 DIAGNOSIS — I951 Orthostatic hypotension: Secondary | ICD-10-CM | POA: Diagnosis not present

## 2020-01-18 ENCOUNTER — Other Ambulatory Visit: Payer: Self-pay | Admitting: *Deleted

## 2020-01-18 DIAGNOSIS — I951 Orthostatic hypotension: Secondary | ICD-10-CM | POA: Diagnosis not present

## 2020-01-18 DIAGNOSIS — R55 Syncope and collapse: Secondary | ICD-10-CM | POA: Diagnosis not present

## 2020-01-18 DIAGNOSIS — M6281 Muscle weakness (generalized): Secondary | ICD-10-CM | POA: Diagnosis not present

## 2020-01-18 NOTE — Patient Outreach (Signed)
THN Post- Acute Care Coordinator follow up. Member screened for potential [facility] Care Management needs as a benefit of [place] Medicare.  Update received from [place] indicating Mr. Wieber remains in the facility receiving Medicare Part B benefits. Unsure of when he will discharge to home.   Will continue to follow while member resides in SNF.    Marthenia Rolling, MSN, RN,BSN [facility] Case Manager [phone]

## 2020-01-19 DIAGNOSIS — M6281 Muscle weakness (generalized): Secondary | ICD-10-CM | POA: Diagnosis not present

## 2020-01-19 DIAGNOSIS — R55 Syncope and collapse: Secondary | ICD-10-CM | POA: Diagnosis not present

## 2020-01-19 DIAGNOSIS — I951 Orthostatic hypotension: Secondary | ICD-10-CM | POA: Diagnosis not present

## 2020-01-20 DIAGNOSIS — R55 Syncope and collapse: Secondary | ICD-10-CM | POA: Diagnosis not present

## 2020-01-20 DIAGNOSIS — R42 Dizziness and giddiness: Secondary | ICD-10-CM | POA: Diagnosis not present

## 2020-01-20 DIAGNOSIS — M6281 Muscle weakness (generalized): Secondary | ICD-10-CM | POA: Diagnosis not present

## 2020-01-20 DIAGNOSIS — R262 Difficulty in walking, not elsewhere classified: Secondary | ICD-10-CM | POA: Diagnosis not present

## 2020-01-20 DIAGNOSIS — I951 Orthostatic hypotension: Secondary | ICD-10-CM | POA: Diagnosis not present

## 2020-01-20 DIAGNOSIS — F0391 Unspecified dementia with behavioral disturbance: Secondary | ICD-10-CM | POA: Diagnosis not present

## 2020-01-20 DIAGNOSIS — I10 Essential (primary) hypertension: Secondary | ICD-10-CM | POA: Diagnosis not present

## 2020-01-21 DIAGNOSIS — R55 Syncope and collapse: Secondary | ICD-10-CM | POA: Diagnosis not present

## 2020-01-21 DIAGNOSIS — I951 Orthostatic hypotension: Secondary | ICD-10-CM | POA: Diagnosis not present

## 2020-01-21 DIAGNOSIS — M6281 Muscle weakness (generalized): Secondary | ICD-10-CM | POA: Diagnosis not present

## 2020-02-02 ENCOUNTER — Other Ambulatory Visit: Payer: Self-pay | Admitting: *Deleted

## 2020-02-02 NOTE — Patient Outreach (Signed)
THN Post- Acute Care Coordinator follow up. Member screened for potential [facility] Care Management needs as a benefit of [place] Medicare.  Communication received from [place] indicating member transitioned to home with daughter Harry [name] on yesterday 02/01/20. He will have [facility] for PT, OT, aide services.   Telephone call made to [name] daughter/DPR [phone]. Patient identifiers confirmed.   Harry [name] is familiar with [place] Management services. Harry [name] is agreeable to [place] Management re-engagement. Discussed palliative care follow up. Harry [name] states "he is not ready" for palliative. Also discussed [facility] for home visits if [facility] is able to accept referral. Harry [name] agreeable to [facility] referral.   Explained services will not interfere or replace home health. Will send email with [place] Management contact information.   Harry [name] declined need for [facility] LCSW follow up. Will make referral to [place] for complex case management and care coordination. Will make referral to [facility].   Harry [name] has increased risk for readmission. Has medical history of CKD stage III, HTN, PVD, peripheral neuropathy, CVA, DM, orthostatic hypotension, syncope.    Marthenia Rolling, MSN, RN,BSN [place] Acute Care Coordinator [phone] [facility]) [phone]  (Toll free office)

## 2020-02-04 ENCOUNTER — Encounter: Payer: Self-pay | Admitting: *Deleted

## 2020-02-04 ENCOUNTER — Other Ambulatory Visit: Payer: Self-pay | Admitting: *Deleted

## 2020-02-04 NOTE — Patient Outreach (Signed)
[place] [facility]) Care Management THN CM Telephone Outreach, Transition of Care post- SNF discharge 02/01/20, new referral Unsuccessful outreach attempt # 1  02/04/2020  Harry [name] 18-Aug-1945 [id number]  Unsuccessful outreach attempt to Harry [name], daughter/ caregiver, on [place] for Harry [name], 74 y/o male referred to [place] by [facility] RN [facility] 02/02/20 after patient experienced recent hospitalization August 17- 27, 2021 for recurrent syncopal episodes and orthostatic hypotension; patient was discharged from hospital to SNF/ rehabilitation facility [facility] health) and was subsequently discharged from [facility] 02/01/20 to home/ self-care with home health services through [facility] for PT/ OT/ and CNA.  Patient has history including, but not limited to, HTN/ HLD; DM with neuropathy; PVD; CKD-III; previous CVA; and dementia.  HIPAA compliant voice mail message left for patient's caregiver, requesting return call back.  Plan:  Will place [facility] CM unsuccessful patient outreach letter in mail requesting call back in writing  Will re-attempt [facility] CM telephone outreach within 4 business days if I do not hear back from patient/ caregiver first  Harry Rack, RN, BSN, [employer] Coordinator [facility] Care Management  [phone]

## 2020-02-09 ENCOUNTER — Other Ambulatory Visit: Payer: Self-pay | Admitting: *Deleted

## 2020-02-09 ENCOUNTER — Encounter: Payer: Self-pay | Admitting: *Deleted

## 2020-02-09 NOTE — Patient Outreach (Signed)
[place] [facility]) Care Management THN CM Telephone Outreach, Transition of Care Post- SNF discharge day # 8 Unsuccessful (consecutive) outreach attempt # 2- new referral  02/09/2020  [name] Dec 10, 1945 [id number]  Unsuccessful (consecutive) second outreach attempt to Harry [name], daughter/ caregiver, on [place] for Cornel Werber, 74 y/o male referred to [place] by [facility] RN [facility] 02/02/20 after patient experienced recent hospitalization August 17- 27, 2021 for recurrent syncopal episodes and orthostatic hypotension; patient was discharged from hospital to SNF/ rehabilitation facility [facility] health) and was subsequently [facility] 02/01/20 to home/ self-care with home health services [facility] for PT/ OT/ and CNA.  Patient has history including, but not limited to, HTN/ HLD; DM with neuropathy; PVD; CKD-III; previous CVA; and dementia.  HIPAA compliant voice mail message left for patient's caregiver, requesting return call back.  Plan:  Verified THN CM unsuccessful patient outreach letter placed in mail requesting call back in writing on February 04, 2020  Will re-attempt [facility] CM telephone outreach within 4 business days if I do not hear back from patient/ caregiver first  Oneta Rack, RN, BSN, [place] Care Management  [phone]

## 2020-02-11 DIAGNOSIS — I10 Essential (primary) hypertension: Secondary | ICD-10-CM | POA: Diagnosis not present

## 2020-02-11 DIAGNOSIS — R42 Dizziness and giddiness: Secondary | ICD-10-CM | POA: Diagnosis not present

## 2020-02-11 DIAGNOSIS — E785 Hyperlipidemia, unspecified: Secondary | ICD-10-CM | POA: Diagnosis not present

## 2020-02-11 DIAGNOSIS — I739 Peripheral vascular disease, unspecified: Secondary | ICD-10-CM | POA: Diagnosis not present

## 2020-02-11 DIAGNOSIS — I951 Orthostatic hypotension: Secondary | ICD-10-CM | POA: Diagnosis not present

## 2020-02-11 DIAGNOSIS — E1165 Type 2 diabetes mellitus with hyperglycemia: Secondary | ICD-10-CM | POA: Diagnosis not present

## 2020-02-11 DIAGNOSIS — E1143 Type 2 diabetes mellitus with diabetic autonomic (poly)neuropathy: Secondary | ICD-10-CM | POA: Diagnosis not present

## 2020-02-12 ENCOUNTER — Encounter: Payer: Self-pay | Admitting: *Deleted

## 2020-02-12 ENCOUNTER — Other Ambulatory Visit: Payer: Self-pay | Admitting: *Deleted

## 2020-02-12 NOTE — Patient Outreach (Signed)
[place] [facility]) Care Management [facility] CM Telephone Outreach, Transition of Care outreach Post- SNF discharge day # 11 Unsuccessful (consecutive) third outreach attempt, new referral  02/12/2020  KAYSON TASKER April 21, 1945 [id number]  Unsuccessful (consecutive) third outreach attempt to [name], daughter/ caregiver, on [place] for Alekzander Cardell, 73 y/o male referred to [place] by [facility] RN [facility] 02/02/20 after patient experienced recent hospitalization August 17-27, 2034for recurrent syncopal episodes and orthostatic hypotension; patient was discharged from hospital to SNF/ rehabilitation facility [facility] health) and was subsequently discharged from [facility] 11/29/21to home/ self-care with home health services through [facility] for PT/ OT/ and CNA.  Patient has history including, but not limited to, HTN/ HLD; DM with neuropathy; PVD; CKD-III; previous CVA; and dementia.  HIPAA compliant voice mail message left for patient's caregiver, requesting return call back.  Plan:  Verified [facility] CM unsuccessful patient outreach letter placed in mail requesting call back in writing on February 04, 2020  Will re-attempt [facility] CM telephone outreach for final attempt in 3 weeks if I do not hear back from patient/ caregiver first  Oneta Rack, RN, BSN, [employer] Coordinator [facility] Care Management  [phone]

## 2020-02-24 DIAGNOSIS — Z7902 Long term (current) use of antithrombotics/antiplatelets: Secondary | ICD-10-CM | POA: Diagnosis not present

## 2020-02-24 DIAGNOSIS — M4800 Spinal stenosis, site unspecified: Secondary | ICD-10-CM | POA: Diagnosis not present

## 2020-02-24 DIAGNOSIS — I69351 Hemiplegia and hemiparesis following cerebral infarction affecting right dominant side: Secondary | ICD-10-CM | POA: Diagnosis not present

## 2020-02-24 DIAGNOSIS — Z9181 History of falling: Secondary | ICD-10-CM | POA: Diagnosis not present

## 2020-02-24 DIAGNOSIS — Z794 Long term (current) use of insulin: Secondary | ICD-10-CM | POA: Diagnosis not present

## 2020-02-24 DIAGNOSIS — I129 Hypertensive chronic kidney disease with stage 1 through stage 4 chronic kidney disease, or unspecified chronic kidney disease: Secondary | ICD-10-CM | POA: Diagnosis not present

## 2020-02-24 DIAGNOSIS — N184 Chronic kidney disease, stage 4 (severe): Secondary | ICD-10-CM | POA: Diagnosis not present

## 2020-02-24 DIAGNOSIS — E1151 Type 2 diabetes mellitus with diabetic peripheral angiopathy without gangrene: Secondary | ICD-10-CM | POA: Diagnosis not present

## 2020-02-24 DIAGNOSIS — G3183 Dementia with Lewy bodies: Secondary | ICD-10-CM | POA: Diagnosis not present

## 2020-02-24 DIAGNOSIS — F0280 Dementia in other diseases classified elsewhere without behavioral disturbance: Secondary | ICD-10-CM | POA: Diagnosis not present

## 2020-02-24 DIAGNOSIS — Z7982 Long term (current) use of aspirin: Secondary | ICD-10-CM | POA: Diagnosis not present

## 2020-02-24 DIAGNOSIS — I951 Orthostatic hypotension: Secondary | ICD-10-CM | POA: Diagnosis not present

## 2020-02-24 DIAGNOSIS — E1122 Type 2 diabetes mellitus with diabetic chronic kidney disease: Secondary | ICD-10-CM | POA: Diagnosis not present

## 2020-03-01 ENCOUNTER — Other Ambulatory Visit: Payer: Self-pay | Admitting: *Deleted

## 2020-03-01 DIAGNOSIS — I129 Hypertensive chronic kidney disease with stage 1 through stage 4 chronic kidney disease, or unspecified chronic kidney disease: Secondary | ICD-10-CM | POA: Diagnosis not present

## 2020-03-01 DIAGNOSIS — N184 Chronic kidney disease, stage 4 (severe): Secondary | ICD-10-CM | POA: Diagnosis not present

## 2020-03-01 DIAGNOSIS — F0280 Dementia in other diseases classified elsewhere without behavioral disturbance: Secondary | ICD-10-CM | POA: Diagnosis not present

## 2020-03-01 DIAGNOSIS — G3183 Dementia with Lewy bodies: Secondary | ICD-10-CM | POA: Diagnosis not present

## 2020-03-01 DIAGNOSIS — I69351 Hemiplegia and hemiparesis following cerebral infarction affecting right dominant side: Secondary | ICD-10-CM | POA: Diagnosis not present

## 2020-03-01 DIAGNOSIS — E1122 Type 2 diabetes mellitus with diabetic chronic kidney disease: Secondary | ICD-10-CM | POA: Diagnosis not present

## 2020-03-01 NOTE — Patient Outreach (Signed)
[place] [facility]) Care Management [facility] CM Telephone Outreach- referral to [place] team  03/01/2020  LUCCIANO VITALI [birth year] [id number]  Successful(consecutive) fourthoutreach attempt to [name], daughter/ caregiver, on [place] for Irl Bodie, 74 y/o male referred to [place] [facility] RN [facility] 02/02/20 after patient experienced recent hospitalization August 17-27, 2035for recurrent syncopal episodes and orthostatic hypotension; patient was discharged from hospital to SNF/ rehabilitation facility [facility] health) and was subsequently discharged from [facility] 11/29/21to home/ self-care with home health services through [facility] for PT/ OT/ and CNA.  Patient has history including, but not limited to, HTN/ HLD; DM with neuropathy; PVD; CKD-III; previous CVA; and dementia.  HIPAA / identity verified with caregiver, who reports she is a Marine scientist and is currently at work; she reports things are "going well," post- recent SNF/ rehabilitation visit; states there was initially a delay in home health services post- SNF discharge, however, this is now resolved and patient is being visited regularly by home health PT.  She also adds that patient has a full time daily private duty caregiver while she works; patient lives with caregiver and she cares for him when she is not at work.  Caregiver provides transportation and attends all provider office visits with patient.  Screening partially completed as caregiver is at work during our phone call; caregiver verbalizes no ongoing care coordination/ disease management/ pharmacy needs; however, she shares that she would like to know if patient might qualify for medicaid or food stamps, and if he does, what the process is to apply.  She would like additional information/ guidance around same and is agreeable to have [place] team contact her for information sharing.  Plan:  Will make patient active with [facility] CM and place referral for [facility] CSW team to  contact patient's daughter to provide information as above  Oneta Rack, RN, BSN, [employer] Coordinator [facility] Care Management  [phone]

## 2020-03-03 DIAGNOSIS — I69351 Hemiplegia and hemiparesis following cerebral infarction affecting right dominant side: Secondary | ICD-10-CM | POA: Diagnosis not present

## 2020-03-03 DIAGNOSIS — I129 Hypertensive chronic kidney disease with stage 1 through stage 4 chronic kidney disease, or unspecified chronic kidney disease: Secondary | ICD-10-CM | POA: Diagnosis not present

## 2020-03-03 DIAGNOSIS — G3183 Dementia with Lewy bodies: Secondary | ICD-10-CM | POA: Diagnosis not present

## 2020-03-03 DIAGNOSIS — N184 Chronic kidney disease, stage 4 (severe): Secondary | ICD-10-CM | POA: Diagnosis not present

## 2020-03-03 DIAGNOSIS — F0280 Dementia in other diseases classified elsewhere without behavioral disturbance: Secondary | ICD-10-CM | POA: Diagnosis not present

## 2020-03-03 DIAGNOSIS — E1122 Type 2 diabetes mellitus with diabetic chronic kidney disease: Secondary | ICD-10-CM | POA: Diagnosis not present

## 2020-03-10 DIAGNOSIS — I69351 Hemiplegia and hemiparesis following cerebral infarction affecting right dominant side: Secondary | ICD-10-CM | POA: Diagnosis not present

## 2020-03-10 DIAGNOSIS — E1122 Type 2 diabetes mellitus with diabetic chronic kidney disease: Secondary | ICD-10-CM | POA: Diagnosis not present

## 2020-03-10 DIAGNOSIS — G3183 Dementia with Lewy bodies: Secondary | ICD-10-CM | POA: Diagnosis not present

## 2020-03-10 DIAGNOSIS — I129 Hypertensive chronic kidney disease with stage 1 through stage 4 chronic kidney disease, or unspecified chronic kidney disease: Secondary | ICD-10-CM | POA: Diagnosis not present

## 2020-03-10 DIAGNOSIS — N184 Chronic kidney disease, stage 4 (severe): Secondary | ICD-10-CM | POA: Diagnosis not present

## 2020-03-10 DIAGNOSIS — F0280 Dementia in other diseases classified elsewhere without behavioral disturbance: Secondary | ICD-10-CM | POA: Diagnosis not present

## 2020-03-11 ENCOUNTER — Other Ambulatory Visit: Payer: Self-pay | Admitting: *Deleted

## 2020-03-11 NOTE — Patient Outreach (Signed)
[place] [facility]) Care Management  03/11/2020  Harry [name] 11/10/1945 [id number]   CSW attempted a second outreach call to pt/family and was unable to reach. CSW was able to leave a HIPPA compliant voice message and will await callback or try again per policy.    Eduard Clos, MSW, [place] Worker  [place] [phone]

## 2020-03-16 DIAGNOSIS — I69351 Hemiplegia and hemiparesis following cerebral infarction affecting right dominant side: Secondary | ICD-10-CM | POA: Diagnosis not present

## 2020-03-16 DIAGNOSIS — F0280 Dementia in other diseases classified elsewhere without behavioral disturbance: Secondary | ICD-10-CM | POA: Diagnosis not present

## 2020-03-16 DIAGNOSIS — N184 Chronic kidney disease, stage 4 (severe): Secondary | ICD-10-CM | POA: Diagnosis not present

## 2020-03-16 DIAGNOSIS — G3183 Dementia with Lewy bodies: Secondary | ICD-10-CM | POA: Diagnosis not present

## 2020-03-16 DIAGNOSIS — E1122 Type 2 diabetes mellitus with diabetic chronic kidney disease: Secondary | ICD-10-CM | POA: Diagnosis not present

## 2020-03-16 DIAGNOSIS — I129 Hypertensive chronic kidney disease with stage 1 through stage 4 chronic kidney disease, or unspecified chronic kidney disease: Secondary | ICD-10-CM | POA: Diagnosis not present

## 2020-03-17 ENCOUNTER — Telehealth: Payer: Self-pay | Admitting: Diagnostic Neuroimaging

## 2020-03-17 NOTE — Telephone Encounter (Signed)
Ok to setup follow up appt. In office or virtual visit ok. -VRP

## 2020-03-17 NOTE — Telephone Encounter (Signed)
Pt.'s daughter [name] called to see about dad being seen by Doctor again to be prescribed a medication that works for dad. She also stated that at last visit here the doctor told her it wasn't much else that we could do here. Please advise.

## 2020-03-17 NOTE — Telephone Encounter (Signed)
Called daughter who stated that her father was prescribed Mestinon in hospital. She stated that since hospital discharge he has not had passing out and spells. She stated he is doing better on Mestinon, but PCP advised it was a neurological medication he would only refill if neurology advised him to do so. PCP preferred not to take over Rx.  I asked who is seeing him for palliative care; she stated he isn't ready for palliative care and refuses to accept it. He is currently receiving PT.   She is requesting follow up with Dr Leta Baptist. I advised will let him know and call her back.

## 2020-03-17 NOTE — Telephone Encounter (Signed)
Called daughter, [name] and advised Dr Leta Baptist will see patient either in office or for VV via My Chart. She asked for VV due to Covid. Appointment set up. [name] verbalized understanding, appreciation.

## 2020-03-18 ENCOUNTER — Other Ambulatory Visit: Payer: Self-pay | Admitting: General Practice

## 2020-03-18 ENCOUNTER — Other Ambulatory Visit: Payer: Self-pay | Admitting: *Deleted

## 2020-03-18 NOTE — Patient Outreach (Signed)
[place] [facility]) Care Management  03/18/2020  Harry [name] Apr 09, 1945 [id number]   CSW attempted a third outreach call to pt's daughter and was unsuccessful.  CSW left a HIPPA compliant voice message and will await callback or try again per workflow policy in 30 days if no return call is received.   Eduard Clos, MSW, [place] Worker  [place] [phone]

## 2020-03-21 ENCOUNTER — Other Ambulatory Visit: Payer: Self-pay | Admitting: *Deleted

## 2020-03-21 NOTE — Patient Outreach (Addendum)
[place] [facility]) Care Management [place] Telephone Outreach Care Coordination  03/21/2020  GLYNDON TURSI 10-Jul-1945 [id number]  [place], 75 y/o male referred to [place] by [facility] 02/02/20 after patient experienced recent hospitalization August 17-27, 2023for recurrent syncopal episodes and orthostatic hypotension; patient was discharged from hospital to SNF/ rehabilitation facility [facility] health) and was subsequently discharged from [facility] 11/29/21to home/ self-care with home health services through [facility] for PT/ OT/ and CNA.  Patient has history including, but not limited to, HTN/ HLD; DM with neuropathy; PVD; CKD-III; previous CVA; and dementia.  Received request from [place] team on 03/18/20 to follow up with home health services previously active for patient as [facility] CSW has been unable to established contact with patient's primary caregiver/ daughter.  4:05 pm:  Contacted [facility] team [phone]); Spoke with Sam, who reports that patient "is not in system;" Sam explained that this means that patient is probably no longer active with home health agency; however, he placed a request for one of the clinical members of their team to contact me further in follow up.  Plan:  Will await follow up call from [facility] clinical team   Addendum:  03/22/20, 11:00 am: received call back from [facility] RN, "Antony Madura," who reports that patient has never been active with their agency; she reports that at time of referral, agency had staffing issues and the CSW at [facility] was seeking other home health agencies.  Will update [facility] CM leadership, as requested.  Oneta Rack, RN, BSN, [employer] [facility] Care Management  [phone]

## 2020-03-23 DIAGNOSIS — I129 Hypertensive chronic kidney disease with stage 1 through stage 4 chronic kidney disease, or unspecified chronic kidney disease: Secondary | ICD-10-CM | POA: Diagnosis not present

## 2020-03-23 DIAGNOSIS — G3183 Dementia with Lewy bodies: Secondary | ICD-10-CM | POA: Diagnosis not present

## 2020-03-23 DIAGNOSIS — N184 Chronic kidney disease, stage 4 (severe): Secondary | ICD-10-CM | POA: Diagnosis not present

## 2020-03-23 DIAGNOSIS — E1122 Type 2 diabetes mellitus with diabetic chronic kidney disease: Secondary | ICD-10-CM | POA: Diagnosis not present

## 2020-03-23 DIAGNOSIS — I69351 Hemiplegia and hemiparesis following cerebral infarction affecting right dominant side: Secondary | ICD-10-CM | POA: Diagnosis not present

## 2020-03-23 DIAGNOSIS — F0280 Dementia in other diseases classified elsewhere without behavioral disturbance: Secondary | ICD-10-CM | POA: Diagnosis not present

## 2020-03-25 DIAGNOSIS — G3183 Dementia with Lewy bodies: Secondary | ICD-10-CM | POA: Diagnosis not present

## 2020-03-25 DIAGNOSIS — Z7982 Long term (current) use of aspirin: Secondary | ICD-10-CM | POA: Diagnosis not present

## 2020-03-25 DIAGNOSIS — Z9181 History of falling: Secondary | ICD-10-CM | POA: Diagnosis not present

## 2020-03-25 DIAGNOSIS — Z794 Long term (current) use of insulin: Secondary | ICD-10-CM | POA: Diagnosis not present

## 2020-03-25 DIAGNOSIS — E1151 Type 2 diabetes mellitus with diabetic peripheral angiopathy without gangrene: Secondary | ICD-10-CM | POA: Diagnosis not present

## 2020-03-25 DIAGNOSIS — I951 Orthostatic hypotension: Secondary | ICD-10-CM | POA: Diagnosis not present

## 2020-03-25 DIAGNOSIS — I129 Hypertensive chronic kidney disease with stage 1 through stage 4 chronic kidney disease, or unspecified chronic kidney disease: Secondary | ICD-10-CM | POA: Diagnosis not present

## 2020-03-25 DIAGNOSIS — E1122 Type 2 diabetes mellitus with diabetic chronic kidney disease: Secondary | ICD-10-CM | POA: Diagnosis not present

## 2020-03-25 DIAGNOSIS — N184 Chronic kidney disease, stage 4 (severe): Secondary | ICD-10-CM | POA: Diagnosis not present

## 2020-03-25 DIAGNOSIS — I69351 Hemiplegia and hemiparesis following cerebral infarction affecting right dominant side: Secondary | ICD-10-CM | POA: Diagnosis not present

## 2020-03-25 DIAGNOSIS — Z7902 Long term (current) use of antithrombotics/antiplatelets: Secondary | ICD-10-CM | POA: Diagnosis not present

## 2020-03-25 DIAGNOSIS — F0280 Dementia in other diseases classified elsewhere without behavioral disturbance: Secondary | ICD-10-CM | POA: Diagnosis not present

## 2020-03-25 DIAGNOSIS — M4800 Spinal stenosis, site unspecified: Secondary | ICD-10-CM | POA: Diagnosis not present

## 2020-04-05 DIAGNOSIS — H40011 Open angle with borderline findings, low risk, right eye: Secondary | ICD-10-CM | POA: Diagnosis not present

## 2020-04-07 DIAGNOSIS — G3183 Dementia with Lewy bodies: Secondary | ICD-10-CM | POA: Diagnosis not present

## 2020-04-07 DIAGNOSIS — I69351 Hemiplegia and hemiparesis following cerebral infarction affecting right dominant side: Secondary | ICD-10-CM | POA: Diagnosis not present

## 2020-04-07 DIAGNOSIS — F0280 Dementia in other diseases classified elsewhere without behavioral disturbance: Secondary | ICD-10-CM | POA: Diagnosis not present

## 2020-04-07 DIAGNOSIS — I129 Hypertensive chronic kidney disease with stage 1 through stage 4 chronic kidney disease, or unspecified chronic kidney disease: Secondary | ICD-10-CM | POA: Diagnosis not present

## 2020-04-07 DIAGNOSIS — N184 Chronic kidney disease, stage 4 (severe): Secondary | ICD-10-CM | POA: Diagnosis not present

## 2020-04-07 DIAGNOSIS — E1122 Type 2 diabetes mellitus with diabetic chronic kidney disease: Secondary | ICD-10-CM | POA: Diagnosis not present

## 2020-04-18 ENCOUNTER — Telehealth (INDEPENDENT_AMBULATORY_CARE_PROVIDER_SITE_OTHER): Payer: Self-pay | Admitting: Diagnostic Neuroimaging

## 2020-04-18 ENCOUNTER — Encounter: Payer: Self-pay | Admitting: Diagnostic Neuroimaging

## 2020-04-18 ENCOUNTER — Other Ambulatory Visit: Payer: Self-pay | Admitting: *Deleted

## 2020-04-18 DIAGNOSIS — F0281 Dementia in other diseases classified elsewhere with behavioral disturbance: Secondary | ICD-10-CM

## 2020-04-18 DIAGNOSIS — G3183 Dementia with Lewy bodies: Secondary | ICD-10-CM

## 2020-04-18 DIAGNOSIS — F02818 Dementia in other diseases classified elsewhere, unspecified severity, with other behavioral disturbance: Secondary | ICD-10-CM

## 2020-04-18 DIAGNOSIS — G903 Multi-system degeneration of the autonomic nervous system: Secondary | ICD-10-CM

## 2020-04-18 NOTE — Patient Outreach (Signed)
[place] [facility]) Care Management  04/18/2020  Harry [name] Jan 29, 1946 [id number]   CSW has been unable to make contact with pt/family after multiple attempts and messages left with no return call.  CSW will sign off.  CSW will advise PCP and [facility] team. CSW will suggest adding [place] SW to orders if [facility] is arranged.  Eduard Clos, MSW, [place] Worker  [place] [phone]

## 2020-04-18 NOTE — Progress Notes (Signed)
-  I contacted the patient's daughter who was available for this call.  Patient was not available at time of appointment, was returning with son-in-law from another location.  Daughter mentioned that patient was prescribed Mestinon in 2021 from hospitalization for neurogenic orthostatic hypotension, and 30 mg 3 times a day was helping prevent syncopal events.  PCP did not feel comfortable refilling this medication until neurology follow-up appointment. -I reviewed information from hospital records and agree to continue this medication Mestinon 30 mg 3 times a day.  We will forward this to PCP so that they can refill medicine going forward.  Patient does have moderate to severe dementia and is trending towards palliative care approach and therefore we will help patient minimize excessive office visits and treatments, by helping PCP manage care going forward  Penni Bombard, MD [id number], 123456 PM Certified in Neurology, Neurophysiology and Neuroimaging  [facility] [address] [place], [place] [address] [phone]

## 2020-04-20 DIAGNOSIS — I129 Hypertensive chronic kidney disease with stage 1 through stage 4 chronic kidney disease, or unspecified chronic kidney disease: Secondary | ICD-10-CM | POA: Diagnosis not present

## 2020-04-20 DIAGNOSIS — N184 Chronic kidney disease, stage 4 (severe): Secondary | ICD-10-CM | POA: Diagnosis not present

## 2020-04-20 DIAGNOSIS — F0280 Dementia in other diseases classified elsewhere without behavioral disturbance: Secondary | ICD-10-CM | POA: Diagnosis not present

## 2020-04-20 DIAGNOSIS — E1122 Type 2 diabetes mellitus with diabetic chronic kidney disease: Secondary | ICD-10-CM | POA: Diagnosis not present

## 2020-04-20 DIAGNOSIS — I69351 Hemiplegia and hemiparesis following cerebral infarction affecting right dominant side: Secondary | ICD-10-CM | POA: Diagnosis not present

## 2020-04-20 DIAGNOSIS — G3183 Dementia with Lewy bodies: Secondary | ICD-10-CM | POA: Diagnosis not present

## 2020-05-04 DIAGNOSIS — R300 Dysuria: Secondary | ICD-10-CM | POA: Diagnosis not present

## 2020-05-04 DIAGNOSIS — R4182 Altered mental status, unspecified: Secondary | ICD-10-CM | POA: Diagnosis not present

## 2020-05-30 DIAGNOSIS — W19XXXD Unspecified fall, subsequent encounter: Secondary | ICD-10-CM | POA: Diagnosis not present

## 2020-05-30 DIAGNOSIS — I951 Orthostatic hypotension: Secondary | ICD-10-CM | POA: Diagnosis not present

## 2020-05-30 DIAGNOSIS — E1143 Type 2 diabetes mellitus with diabetic autonomic (poly)neuropathy: Secondary | ICD-10-CM | POA: Diagnosis not present

## 2020-06-03 ENCOUNTER — Emergency Department (HOSPITAL_COMMUNITY): Payer: Medicare Other

## 2020-06-03 ENCOUNTER — Inpatient Hospital Stay (HOSPITAL_COMMUNITY)
Admission: EM | Admit: 2020-06-03 | Discharge: 2020-06-07 | DRG: 871 | Disposition: A | Discharge status: Skilled Nursing Facility | Payer: Medicare Other | Attending: Internal Medicine | Admitting: Internal Medicine

## 2020-06-03 ENCOUNTER — Encounter (HOSPITAL_COMMUNITY): Payer: Self-pay

## 2020-06-03 DIAGNOSIS — R410 Disorientation, unspecified: Secondary | ICD-10-CM | POA: Diagnosis not present

## 2020-06-03 DIAGNOSIS — Z794 Long term (current) use of insulin: Secondary | ICD-10-CM

## 2020-06-03 DIAGNOSIS — D631 Anemia in chronic kidney disease: Secondary | ICD-10-CM | POA: Diagnosis present

## 2020-06-03 DIAGNOSIS — F0390 Unspecified dementia without behavioral disturbance: Secondary | ICD-10-CM | POA: Diagnosis not present

## 2020-06-03 DIAGNOSIS — E11649 Type 2 diabetes mellitus with hypoglycemia without coma: Secondary | ICD-10-CM | POA: Diagnosis not present

## 2020-06-03 DIAGNOSIS — N189 Chronic kidney disease, unspecified: Secondary | ICD-10-CM

## 2020-06-03 DIAGNOSIS — R0902 Hypoxemia: Secondary | ICD-10-CM | POA: Diagnosis not present

## 2020-06-03 DIAGNOSIS — D638 Anemia in other chronic diseases classified elsewhere: Secondary | ICD-10-CM | POA: Diagnosis present

## 2020-06-03 DIAGNOSIS — R262 Difficulty in walking, not elsewhere classified: Secondary | ICD-10-CM | POA: Diagnosis not present

## 2020-06-03 DIAGNOSIS — I10 Essential (primary) hypertension: Secondary | ICD-10-CM | POA: Diagnosis not present

## 2020-06-03 DIAGNOSIS — R6520 Severe sepsis without septic shock: Secondary | ICD-10-CM | POA: Diagnosis present

## 2020-06-03 DIAGNOSIS — Z888 Allergy status to other drugs, medicaments and biological substances status: Secondary | ICD-10-CM

## 2020-06-03 DIAGNOSIS — G3183 Dementia with Lewy bodies: Secondary | ICD-10-CM | POA: Diagnosis present

## 2020-06-03 DIAGNOSIS — J189 Pneumonia, unspecified organism: Secondary | ICD-10-CM

## 2020-06-03 DIAGNOSIS — F0280 Dementia in other diseases classified elsewhere without behavioral disturbance: Secondary | ICD-10-CM | POA: Diagnosis present

## 2020-06-03 DIAGNOSIS — E869 Volume depletion, unspecified: Secondary | ICD-10-CM | POA: Diagnosis present

## 2020-06-03 DIAGNOSIS — G9341 Metabolic encephalopathy: Secondary | ICD-10-CM

## 2020-06-03 DIAGNOSIS — N1832 Chronic kidney disease, stage 3b: Secondary | ICD-10-CM

## 2020-06-03 DIAGNOSIS — F05 Delirium due to known physiological condition: Secondary | ICD-10-CM | POA: Diagnosis present

## 2020-06-03 DIAGNOSIS — F17210 Nicotine dependence, cigarettes, uncomplicated: Secondary | ICD-10-CM | POA: Diagnosis present

## 2020-06-03 DIAGNOSIS — R799 Abnormal finding of blood chemistry, unspecified: Secondary | ICD-10-CM

## 2020-06-03 DIAGNOSIS — R1312 Dysphagia, oropharyngeal phase: Secondary | ICD-10-CM | POA: Diagnosis not present

## 2020-06-03 DIAGNOSIS — Z8249 Family history of ischemic heart disease and other diseases of the circulatory system: Secondary | ICD-10-CM

## 2020-06-03 DIAGNOSIS — Z7401 Bed confinement status: Secondary | ICD-10-CM | POA: Diagnosis not present

## 2020-06-03 DIAGNOSIS — J208 Acute bronchitis due to other specified organisms: Secondary | ICD-10-CM | POA: Diagnosis present

## 2020-06-03 DIAGNOSIS — M6281 Muscle weakness (generalized): Secondary | ICD-10-CM | POA: Diagnosis not present

## 2020-06-03 DIAGNOSIS — E1151 Type 2 diabetes mellitus with diabetic peripheral angiopathy without gangrene: Secondary | ICD-10-CM | POA: Diagnosis present

## 2020-06-03 DIAGNOSIS — E1169 Type 2 diabetes mellitus with other specified complication: Secondary | ICD-10-CM

## 2020-06-03 DIAGNOSIS — Z7902 Long term (current) use of antithrombotics/antiplatelets: Secondary | ICD-10-CM

## 2020-06-03 DIAGNOSIS — I951 Orthostatic hypotension: Secondary | ICD-10-CM | POA: Diagnosis not present

## 2020-06-03 DIAGNOSIS — R4182 Altered mental status, unspecified: Secondary | ICD-10-CM | POA: Diagnosis not present

## 2020-06-03 DIAGNOSIS — I160 Hypertensive urgency: Secondary | ICD-10-CM | POA: Diagnosis present

## 2020-06-03 DIAGNOSIS — N1831 Chronic kidney disease, stage 3a: Secondary | ICD-10-CM | POA: Diagnosis not present

## 2020-06-03 DIAGNOSIS — R2681 Unsteadiness on feet: Secondary | ICD-10-CM | POA: Diagnosis not present

## 2020-06-03 DIAGNOSIS — E785 Hyperlipidemia, unspecified: Secondary | ICD-10-CM | POA: Diagnosis present

## 2020-06-03 DIAGNOSIS — I129 Hypertensive chronic kidney disease with stage 1 through stage 4 chronic kidney disease, or unspecified chronic kidney disease: Secondary | ICD-10-CM | POA: Diagnosis present

## 2020-06-03 DIAGNOSIS — R2689 Other abnormalities of gait and mobility: Secondary | ICD-10-CM | POA: Diagnosis not present

## 2020-06-03 DIAGNOSIS — A419 Sepsis, unspecified organism: Secondary | ICD-10-CM | POA: Diagnosis not present

## 2020-06-03 DIAGNOSIS — R443 Hallucinations, unspecified: Secondary | ICD-10-CM | POA: Diagnosis not present

## 2020-06-03 DIAGNOSIS — E1122 Type 2 diabetes mellitus with diabetic chronic kidney disease: Secondary | ICD-10-CM | POA: Diagnosis present

## 2020-06-03 DIAGNOSIS — R531 Weakness: Secondary | ICD-10-CM | POA: Diagnosis not present

## 2020-06-03 DIAGNOSIS — N179 Acute kidney failure, unspecified: Secondary | ICD-10-CM | POA: Diagnosis present

## 2020-06-03 DIAGNOSIS — N183 Chronic kidney disease, stage 3 unspecified: Secondary | ICD-10-CM

## 2020-06-03 DIAGNOSIS — Z79899 Other long term (current) drug therapy: Secondary | ICD-10-CM

## 2020-06-03 DIAGNOSIS — R509 Fever, unspecified: Secondary | ICD-10-CM | POA: Diagnosis not present

## 2020-06-03 DIAGNOSIS — R41841 Cognitive communication deficit: Secondary | ICD-10-CM | POA: Diagnosis not present

## 2020-06-03 DIAGNOSIS — R404 Transient alteration of awareness: Secondary | ICD-10-CM | POA: Diagnosis not present

## 2020-06-03 DIAGNOSIS — M2550 Pain in unspecified joint: Secondary | ICD-10-CM | POA: Diagnosis not present

## 2020-06-03 DIAGNOSIS — Z20822 Contact with and (suspected) exposure to covid-19: Secondary | ICD-10-CM | POA: Diagnosis present

## 2020-06-03 DIAGNOSIS — Z8673 Personal history of transient ischemic attack (TIA), and cerebral infarction without residual deficits: Secondary | ICD-10-CM

## 2020-06-03 DIAGNOSIS — R5381 Other malaise: Secondary | ICD-10-CM

## 2020-06-03 DIAGNOSIS — B9781 Human metapneumovirus as the cause of diseases classified elsewhere: Secondary | ICD-10-CM | POA: Diagnosis present

## 2020-06-03 DIAGNOSIS — Z7982 Long term (current) use of aspirin: Secondary | ICD-10-CM

## 2020-06-03 HISTORY — DX: Metabolic encephalopathy: G93.41

## 2020-06-03 HISTORY — DX: Pneumonia, unspecified organism: J18.9

## 2020-06-03 LAB — URINALYSIS, ROUTINE W REFLEX MICROSCOPIC
Bacteria, UA: NONE SEEN
Bilirubin Urine: NEGATIVE
Glucose, UA: 50 mg/dL — AB
Ketones, ur: NEGATIVE mg/dL
Leukocytes,Ua: NEGATIVE
Nitrite: NEGATIVE
Protein, ur: NEGATIVE mg/dL
Specific Gravity, Urine: 1.016 (ref 1.005–1.030)
pH: 5 (ref 5.0–8.0)

## 2020-06-03 LAB — CBC WITH DIFFERENTIAL/PLATELET
Abs Immature Granulocytes: 0.02 10*3/uL (ref 0.00–0.07)
Basophils Absolute: 0 10*3/uL (ref 0.0–0.1)
Basophils Relative: 0 %
Eosinophils Absolute: 0.2 10*3/uL (ref 0.0–0.5)
Eosinophils Relative: 3 %
HCT: 26.7 % — ABNORMAL LOW (ref 39.0–52.0)
Hemoglobin: 8.1 g/dL — ABNORMAL LOW (ref 13.0–17.0)
Immature Granulocytes: 0 %
Lymphocytes Relative: 23 %
Lymphs Abs: 1.1 10*3/uL (ref 0.7–4.0)
MCH: 28.8 pg (ref 26.0–34.0)
MCHC: 30.3 g/dL (ref 30.0–36.0)
MCV: 95 fL (ref 80.0–100.0)
Monocytes Absolute: 0.7 10*3/uL (ref 0.1–1.0)
Monocytes Relative: 14 %
Neutro Abs: 2.8 10*3/uL (ref 1.7–7.7)
Neutrophils Relative %: 60 %
Platelets: 194 10*3/uL (ref 150–400)
RBC: 2.81 MIL/uL — ABNORMAL LOW (ref 4.22–5.81)
RDW: 14.8 % (ref 11.5–15.5)
WBC: 4.7 10*3/uL (ref 4.0–10.5)
nRBC: 0 % (ref 0.0–0.2)

## 2020-06-03 LAB — COMPREHENSIVE METABOLIC PANEL
ALT: 23 U/L (ref 0–44)
AST: 25 U/L (ref 15–41)
Albumin: 3.3 g/dL — ABNORMAL LOW (ref 3.5–5.0)
Alkaline Phosphatase: 50 U/L (ref 38–126)
Anion gap: 6 (ref 5–15)
BUN: 31 mg/dL — ABNORMAL HIGH (ref 8–23)
CO2: 23 mmol/L (ref 22–32)
Calcium: 8.3 mg/dL — ABNORMAL LOW (ref 8.9–10.3)
Chloride: 110 mmol/L (ref 98–111)
Creatinine, Ser: 1.82 mg/dL — ABNORMAL HIGH (ref 0.61–1.24)
GFR, Estimated: 39 mL/min — ABNORMAL LOW (ref >60)
Glucose, Bld: 109 mg/dL — ABNORMAL HIGH (ref 70–99)
Potassium: 3.8 mmol/L (ref 3.5–5.1)
Sodium: 139 mmol/L (ref 135–145)
Total Bilirubin: 1 mg/dL (ref 0.3–1.2)
Total Protein: 6.6 g/dL (ref 6.5–8.1)

## 2020-06-03 LAB — LACTIC ACID, PLASMA
Lactic Acid, Venous: 1.1 mmol/L (ref 0.5–1.9)
Lactic Acid, Venous: 1.1 mmol/L (ref 0.5–1.9)

## 2020-06-03 LAB — RESP PANEL BY RT-PCR (FLU A&B, COVID) ARPGX2
Influenza A by PCR: NEGATIVE
Influenza B by PCR: NEGATIVE
SARS Coronavirus 2 by RT PCR: NEGATIVE

## 2020-06-03 LAB — TYPE AND SCREEN
ABO/RH(D): B POS
Antibody Screen: NEGATIVE

## 2020-06-03 LAB — HEMOGLOBIN AND HEMATOCRIT, BLOOD
HCT: 26.8 % — ABNORMAL LOW (ref 39.0–52.0)
Hemoglobin: 8.3 g/dL — ABNORMAL LOW (ref 13.0–17.0)

## 2020-06-03 LAB — APTT: aPTT: 31 seconds (ref 24–36)

## 2020-06-03 LAB — PROTIME-INR
INR: 1.2 (ref 0.8–1.2)
Prothrombin Time: 14.2 seconds (ref 11.4–15.2)

## 2020-06-03 LAB — ABO/RH: ABO/RH(D): B POS

## 2020-06-03 LAB — PROCALCITONIN: Procalcitonin: <0.1 ng/mL

## 2020-06-03 MED ORDER — LACTATED RINGERS IV SOLN: INTRAVENOUS | Status: AC

## 2020-06-03 MED ORDER — INSULIN GLARGINE 100 UNIT/ML ~~LOC~~ SOLN
7.0000 [IU] | Freq: Every day | SUBCUTANEOUS | Status: DC
  Administered 2020-06-03 – 2020-06-06 (×4): 7 [IU] via SUBCUTANEOUS
  Filled 2020-06-03 (×5): qty 0.07

## 2020-06-03 MED ORDER — CARVEDILOL 12.5 MG PO TABS
12.5000 mg | ORAL_TABLET | Freq: Every day | ORAL | Status: DC
  Administered 2020-06-03 – 2020-06-06 (×4): 12.5 mg via ORAL
  Filled 2020-06-03 (×4): qty 1

## 2020-06-03 MED ORDER — POLYVINYL ALCOHOL 1.4 % OP SOLN
1.0000 [drp] | OPHTHALMIC | Status: DC | PRN
  Filled 2020-06-03: qty 15

## 2020-06-03 MED ORDER — ACETAMINOPHEN 500 MG PO TABS
1000.0000 mg | ORAL_TABLET | Freq: Once | ORAL | Status: AC
  Administered 2020-06-03: 1000 mg via ORAL
  Filled 2020-06-03: qty 2

## 2020-06-03 MED ORDER — SODIUM CHLORIDE 0.9 % IV SOLN: 2.0000 g | Freq: Two times a day (BID) | INTRAVENOUS | Status: DC

## 2020-06-03 MED ORDER — GUAIFENESIN ER 600 MG PO TB12
600.0000 mg | ORAL_TABLET | Freq: Two times a day (BID) | ORAL | Status: DC
  Administered 2020-06-03 – 2020-06-07 (×8): 600 mg via ORAL
  Filled 2020-06-03 (×8): qty 1

## 2020-06-03 MED ORDER — ENOXAPARIN SODIUM 40 MG/0.4ML ~~LOC~~ SOLN: 40.0000 mg | SUBCUTANEOUS | Status: DC

## 2020-06-03 MED ORDER — INSULIN ASPART 100 UNIT/ML ~~LOC~~ SOLN
0.0000 [IU] | Freq: Three times a day (TID) | SUBCUTANEOUS | Status: DC
  Administered 2020-06-05 – 2020-06-06 (×2): 1 [IU] via SUBCUTANEOUS
  Administered 2020-06-07: 2 [IU] via SUBCUTANEOUS

## 2020-06-03 MED ORDER — SODIUM CHLORIDE 0.9 % IV SOLN
2.0000 g | INTRAVENOUS | Status: DC
  Administered 2020-06-03 – 2020-06-06 (×4): 2 g via INTRAVENOUS
  Filled 2020-06-03 (×5): qty 20

## 2020-06-03 MED ORDER — ROSUVASTATIN CALCIUM 5 MG PO TABS
5.0000 mg | ORAL_TABLET | Freq: Every evening | ORAL | Status: DC
  Administered 2020-06-03 – 2020-06-07 (×4): 5 mg via ORAL
  Filled 2020-06-03 (×5): qty 1

## 2020-06-03 MED ORDER — SERTRALINE HCL 50 MG PO TABS
50.0000 mg | ORAL_TABLET | Freq: Every day | ORAL | Status: DC
  Administered 2020-06-03 – 2020-06-07 (×5): 50 mg via ORAL
  Filled 2020-06-03 (×5): qty 1

## 2020-06-03 MED ORDER — METRONIDAZOLE IN NACL 5-0.79 MG/ML-% IV SOLN
500.0000 mg | Freq: Once | INTRAVENOUS | Status: AC
  Administered 2020-06-03: 500 mg via INTRAVENOUS
  Filled 2020-06-03: qty 100

## 2020-06-03 MED ORDER — LACTATED RINGERS IV SOLN: INTRAVENOUS | Status: DC

## 2020-06-03 MED ORDER — VANCOMYCIN HCL 10 G IV SOLR
1750.0000 mg | Freq: Once | INTRAVENOUS | Status: AC
  Administered 2020-06-03: 1750 mg via INTRAVENOUS
  Filled 2020-06-03: qty 1750

## 2020-06-03 MED ORDER — VANCOMYCIN HCL 750 MG/150ML IV SOLN: 750.0000 mg | Freq: Two times a day (BID) | INTRAVENOUS | Status: DC

## 2020-06-03 MED ORDER — ALBUTEROL SULFATE (2.5 MG/3ML) 0.083% IN NEBU: 2.5000 mg | INHALATION_SOLUTION | Freq: Four times a day (QID) | RESPIRATORY_TRACT | Status: DC | PRN

## 2020-06-03 MED ORDER — SODIUM CHLORIDE 0.9 % IV SOLN
500.0000 mg | INTRAVENOUS | Status: DC
  Administered 2020-06-03 – 2020-06-06 (×4): 500 mg via INTRAVENOUS
  Filled 2020-06-03 (×5): qty 500

## 2020-06-03 MED ORDER — SODIUM CHLORIDE 0.9 % IV SOLN
1.0000 g | Freq: Once | INTRAVENOUS | Status: AC
  Administered 2020-06-03: 1 g via INTRAVENOUS
  Filled 2020-06-03: qty 10

## 2020-06-03 MED ORDER — LACTATED RINGERS IV BOLUS (SEPSIS)
1000.0000 mL | Freq: Once | INTRAVENOUS | Status: AC
  Administered 2020-06-03: 1000 mL via INTRAVENOUS

## 2020-06-03 MED ORDER — PYRIDOSTIGMINE BROMIDE 60 MG PO TABS
30.0000 mg | ORAL_TABLET | Freq: Three times a day (TID) | ORAL | Status: DC
  Administered 2020-06-03 – 2020-06-07 (×12): 30 mg via ORAL
  Filled 2020-06-03 (×15): qty 0.5

## 2020-06-03 MED ORDER — VANCOMYCIN HCL 1000 MG/200ML IV SOLN: 1000.0000 mg | Freq: Once | INTRAVENOUS | Status: DC

## 2020-06-03 MED ORDER — SODIUM CHLORIDE 0.9 % IV SOLN
2.0000 g | Freq: Once | INTRAVENOUS | Status: AC
  Administered 2020-06-03: 2 g via INTRAVENOUS
  Filled 2020-06-03: qty 2

## 2020-06-03 MED ORDER — HYDRALAZINE HCL 20 MG/ML IJ SOLN
10.0000 mg | INTRAMUSCULAR | Status: DC | PRN
  Administered 2020-06-03 – 2020-06-07 (×3): 10 mg via INTRAVENOUS
  Filled 2020-06-03 (×3): qty 1
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
## 2020-06-03 NOTE — ED Triage Notes (Addendum)
Pt arrived from home, family called because pt has and increased AMS 3x days.  disoriented to time and place, family states pt usually remebers their names but wasn't able to today  Medics temp 101  Sugar 130 HR 100  Hx of dementia  EMS gave 500cc NS through 18 gauge PIV

## 2020-06-03 NOTE — ED Notes (Signed)
Pt resting in bed with eyes closed and snoring.  Respirations are even and non-labored, skin is warm, dry and intact Repeat temperature improved

## 2020-06-03 NOTE — Sepsis Progress Note (Signed)
Code Sepsis protocol being monitored by eLink. 

## 2020-06-03 NOTE — Progress Notes (Signed)
Pharmacy Antibiotic Note  Harry [name] is a 75 y.o. male admitted on 06/03/2020 with confusion and fever concerning for infection of unknow source.  Pharmacy has been consulted for Cefepime and vancomycin dosing.  WBC wnl, SCr 1.82, LA 1.1   Plan: -Vancomycin 1750 mg IV load followed by vancomycin 750 mg IV Q 12 hours -Cefepime 2 gm IV Q 12 hours -Monitor CBC, renal fx, cultures and clinical progress -VT as indicated      No data recorded.  No results for input(s): WBC, CREATININE, LATICACIDVEN, VANCOTROUGH, VANCOPEAK, VANCORANDOM, GENTTROUGH, GENTPEAK, GENTRANDOM, TOBRATROUGH, TOBRAPEAK, TOBRARND, AMIKACINPEAK, AMIKACINTROU, AMIKACIN in the last 168 hours.  CrCl cannot be calculated (Patient's most recent lab result is older than the maximum 21 days allowed.).    Allergies  Allergen Reactions  . Lipitor [Atorvastatin] Other (See Comments)    Myalgia   . Statins Other (See Comments)    Myalgia (CAN tolerate Crestor, however)  . Pravachol [Pravastatin] Rash    Antimicrobials this admission: Cefepime 4/1 >>  Vanc 4/2 >>   Dose adjustments this admission:   Microbiology results: 4/1 BCx:  4/1 UCx:     Thank you for allowing pharmacy to be a part of this patient's care.  Albertina Parr, PharmD., BCPS, BCCCP Clinical Pharmacist Please refer to [facility] for unit-specific pharmacist

## 2020-06-04 DIAGNOSIS — A419 Sepsis, unspecified organism: Principal | ICD-10-CM

## 2020-06-04 DIAGNOSIS — R6520 Severe sepsis without septic shock: Secondary | ICD-10-CM

## 2020-06-04 LAB — HEMOGLOBIN A1C
Hgb A1c MFr Bld: 7.1 % — ABNORMAL HIGH (ref 4.8–5.6)
Mean Plasma Glucose: 157.07 mg/dL

## 2020-06-04 LAB — CBC
HCT: 24.4 % — ABNORMAL LOW (ref 39.0–52.0)
Hemoglobin: 7.7 g/dL — ABNORMAL LOW (ref 13.0–17.0)
MCH: 28.9 pg (ref 26.0–34.0)
MCHC: 31.6 g/dL (ref 30.0–36.0)
MCV: 91.7 fL (ref 80.0–100.0)
Platelets: 159 K/uL (ref 150–400)
RBC: 2.66 MIL/uL — ABNORMAL LOW (ref 4.22–5.81)
RDW: 14.8 % (ref 11.5–15.5)
WBC: 4.2 K/uL (ref 4.0–10.5)
nRBC: 0 % (ref 0.0–0.2)

## 2020-06-04 LAB — BASIC METABOLIC PANEL WITH GFR
Anion gap: 7 (ref 5–15)
BUN: 25 mg/dL — ABNORMAL HIGH (ref 8–23)
CO2: 21 mmol/L — ABNORMAL LOW (ref 22–32)
Calcium: 8.3 mg/dL — ABNORMAL LOW (ref 8.9–10.3)
Chloride: 106 mmol/L (ref 98–111)
Creatinine, Ser: 1.61 mg/dL — ABNORMAL HIGH (ref 0.61–1.24)
GFR, Estimated: 45 mL/min — ABNORMAL LOW
Glucose, Bld: 75 mg/dL (ref 70–99)
Potassium: 3.7 mmol/L (ref 3.5–5.1)
Sodium: 134 mmol/L — ABNORMAL LOW (ref 135–145)

## 2020-06-04 LAB — RESPIRATORY PANEL BY PCR

## 2020-06-04 LAB — GLUCOSE, CAPILLARY
Glucose-Capillary: 90 mg/dL (ref 70–99)
Glucose-Capillary: 114 mg/dL — ABNORMAL HIGH (ref 70–99)
Glucose-Capillary: 107 mg/dL — ABNORMAL HIGH (ref 70–99)
Glucose-Capillary: 210 mg/dL — ABNORMAL HIGH (ref 70–99)

## 2020-06-04 LAB — OCCULT BLOOD X 1 CARD TO LAB, STOOL: Fecal Occult Bld: NEGATIVE

## 2020-06-04 NOTE — Assessment & Plan Note (Addendum)
-  A1c 7.1% -Lantus discontinued; suspect should be okay without Lantus, if does start to elevate outpatient, could start resuming at lower dose -Continue on sliding scale

## 2020-06-04 NOTE — Assessment & Plan Note (Addendum)
-  patient has history of CKD3a. Baseline creat ~ 1.5, eGFR 49-51 - patient presents with increase in creat >0.3 mg/dL above baseline presumed to have occurred within past 7 days PTA -Presumed prerenal from volume depletion in setting of acute illness -Received fluids on admission -Renal function has returned to baseline

## 2020-06-04 NOTE — Evaluation (Signed)
Occupational Therapy Evaluation Patient Details Name: Harry [name] MRN: [id number] DOB: 05-Aug-1945 Today's Date: 06/04/2020    History of Present Illness HPI: Harry [name] is a 75 y.o. male with medical history significant of hypertension, hyperlipidemia, diabetes mellitus type 2, CVA, peripheral vascular disease, chronic kidney disease stage III, and dementia presents after family noted that he was altered over the last 3 days.   Clinical Impression   Patient admitted for the diagnosis above.  PTA he lives with his daughter, per the chart they assist with all IADL, meals, home management and community mobility.  Deficits are listed below. Currently, he is needing up to Mod A for basic mobility and Max A for ADL.  Family is able to provide 24 hour assist.  However, based on his currently mobility status and deficits, OT will recommend SNF for ST rehab prior to returning home.  OT will follow in the acute setting to maximize his functional status.       Follow Up Recommendations  SNF    Equipment Recommendations  None recommended by OT    Recommendations for Other Services       Precautions / Restrictions Precautions Precautions: Fall Precaution Comments: Incontinient of stool Restrictions Weight Bearing Restrictions: No      Mobility Bed Mobility Overal bed mobility: Needs Assistance Bed Mobility: Supine to Sit     Supine to sit: Mod assist     General bed mobility comments: assist to bring feet all the way off, and to scoot to the edge of the bed    Transfers Overall transfer level: Needs assistance   Transfers: Sit to/from Stand;Stand Pivot Transfers Sit to Stand: Mod assist Stand pivot transfers: Mod assist       General transfer comment: No RW in the room    Balance Overall balance assessment: Needs assistance Sitting-balance support: Feet supported Sitting balance-Leahy Scale: Fair     Standing balance support: Bilateral upper extremity  supported Standing balance-Leahy Scale: Poor Standing balance comment: uses RW at baseline                           ADL either performed or assessed with clinical judgement   ADL Overall ADL's : Needs assistance/impaired Eating/Feeding: Set up;Sitting   Grooming: Wash/dry hands;Wash/dry face;Supervision/safety;Sitting   Upper Body Bathing: Minimal assistance;Sitting Upper Body Bathing Details (indicate cue type and reason): thoroughness     Upper Body Dressing : Minimal assistance;Sitting   Lower Body Dressing: Moderate assistance;Sitting/lateral leans       Toileting- Clothing Manipulation and Hygiene: Maximal assistance;Sit to/from stand               Vision Patient Visual Report: No change from baseline       Perception     Praxis      Pertinent Vitals/Pain Pain Assessment: Faces Faces Pain Scale: No hurt Pain Intervention(s): Monitored during session     Hand Dominance Right   Extremity/Trunk Assessment Upper Extremity Assessment Upper Extremity Assessment: Generalized weakness   Lower Extremity Assessment Lower Extremity Assessment: Defer to PT evaluation   Cervical / Trunk Assessment Cervical / Trunk Assessment: Kyphotic   Communication Communication Communication: No difficulties   Cognition Arousal/Alertness: Awake/alert Behavior During Therapy: WFL for tasks assessed/performed Overall Cognitive Status: History of cognitive impairments - at baseline  General Comments: Per H&P he is more confused than normal.  He is following simple one step commands.   General Comments       Exercises     Shoulder Instructions      Home Living Family/patient expects to be discharged to:: Private residence Living Arrangements: Children Available Help at Discharge: Family;Available 24 hours/day Type of Home: House Home Access: Level entry     Home Layout: Two level Alternate Level  Stairs-Number of Steps: flight Alternate Level Stairs-Rails: Right;Left Bathroom Shower/Tub: Occupational psychologist: Standard     Home Equipment: Environmental consultant - 4 wheels;Shower seat;Grab bars - tub/shower;Cane - single point          Prior Functioning/Environment Level of Independence: Independent with assistive device(s)        Comments: Pt reports use of Rollator inside of the home, able to complete ADLs, reports family does most IADLs. pt does not drive.  Sounds as if he stays upstairs.        OT Problem List: Decreased strength;Decreased activity tolerance;Impaired balance (sitting and/or standing);Decreased safety awareness;Decreased cognition      OT Treatment/Interventions: Self-care/ADL training;Therapeutic activities;Balance training    OT Goals(Current goals can be found in the care plan section) Acute Rehab OT Goals Patient Stated Goal: I'm ready to go home OT Goal Formulation: With patient Time For Goal Achievement: 06/18/20 Potential to Achieve Goals: Good ADL Goals Pt Will Perform Grooming: with set-up;sitting Pt Will Perform Upper Body Bathing: with set-up;sitting Pt Will Perform Upper Body Dressing: with set-up;sitting Pt Will Transfer to Toilet: with min guard assist;ambulating;regular height toilet Pt Will Perform Toileting - Clothing Manipulation and hygiene: with min guard assist;sit to/from stand  OT Frequency: Min 2X/week   Barriers to D/C:    none noted       Co-evaluation              AM-PAC OT "6 Clicks" Daily Activity     Outcome Measure Help from another person eating meals?: None Help from another person taking care of personal grooming?: A Little Help from another person toileting, which includes using toliet, bedpan, or urinal?: A Lot Help from another person bathing (including washing, rinsing, drying)?: A Lot Help from another person to put on and taking off regular upper body clothing?: A Little Help from another person  to put on and taking off regular lower body clothing?: A Lot 6 Click Score: 16   End of Session Nurse Communication: Mobility status  Activity Tolerance: Patient tolerated treatment well Patient left: in chair;with call bell/phone within reach;with chair alarm set  OT Visit Diagnosis: Unsteadiness on feet (R26.81);Muscle weakness (generalized) (M62.81)                Time: [id number] OT Time Calculation (min): 28 min Charges:  OT General Charges $OT Visit: 1 Visit OT Evaluation $OT Eval Moderate Complexity: 1 Mod OT Treatments $Self Care/Home Management : 8-22 mins  06/04/2020  Rich, OTR/L  Acute Rehabilitation Services  Office:  [phone]   Metta Clines 06/04/2020, 12:59 PM

## 2020-06-04 NOTE — Assessment & Plan Note (Addendum)
-  Baseline hemoglobin approximately 8 to 9 g/dL. -Remains at baseline at discharge, 8.1 g/dL

## 2020-06-04 NOTE — Assessment & Plan Note (Addendum)
-   continue safety precautions and bed alarm while in hospital

## 2020-06-04 NOTE — Assessment & Plan Note (Signed)
-  Continue Coreg 

## 2020-06-04 NOTE — Evaluation (Signed)
Physical Therapy Evaluation Patient Details Name: Harry [name] MRN: [id number] DOB: 1946/02/08 Today's Date: 06/04/2020   History of Present Illness  The pt is a 75 yo male presenting from home with AMS on 4/1. Upon workup, pt found to be febrile, chest x-ray and concerning for pneumonia. PMH includes: HTN, HLD, DM II, CVA, PVD, CKD III, and dementia.    Clinical Impression  Pt in bed upon arrival of PT, agreeable to evaluation at this time. Prior to admission the pt states he was mobilizing with rollator, but was otherwise unable to participate in history taking due to baseline dementia. The pt states he lives with his mother who provides assistance with mobility. The pt now presents with limitations in functional mobility, power, strength, stability, and endurance due to above dx, and will continue to benefit from skilled PT to address these deficits. The pt was able to complete sit-stand from recliner, standing balance, and short bouts of ambulation with modA at this time. He requires verbal cues for sequencing, increased time for all mobility and increased time to process commands/instructions. The pt was fatigued after ~5 ft ambulation forwards and back with use of RW, required seated rest to recover. The pt will benefit from continued therapy acutely and following d/c, will continue to assess pending progress with mobility and family availability for assist at d/c for best d/c plan given pt's baseline dementia, but would require SNF level therapy and assist at this time.      Follow Up Recommendations SNF;Supervision/Assistance - 24 hour    Equipment Recommendations  Rolling walker with 5" wheels (defer to post acute)    Recommendations for Other Services       Precautions / Restrictions Precautions Precautions: Fall Precaution Comments: Incontinient of stool (and unaware) Restrictions Weight Bearing Restrictions: No      Mobility  Bed Mobility Overal bed mobility: Needs  Assistance Bed Mobility: Supine to Sit     Supine to sit: Mod assist     General bed mobility comments: pt OOB in recliner at start and end of session    Transfers Overall transfer level: Needs assistance Equipment used: Rolling walker (2 wheeled) Transfers: Sit to/from Stand Sit to Stand: Mod assist Stand pivot transfers: Mod assist       General transfer comment: modA with RW, increased assist with fatigue after short ambulation in room  Ambulation/Gait Ambulation/Gait assistance: Min assist Gait Distance (Feet): 5 Feet (forwards and back) Assistive device: Rolling walker (2 wheeled) Gait Pattern/deviations: Step-through pattern;Decreased stride length;Trunk flexed Gait velocity: decreased Gait velocity interpretation: <1.31 ft/sec, indicative of household ambulator General Gait Details: short, slow steps with significant reliance on UE support. trunk flexed, cues for posture, positioning in RW      Balance Overall balance assessment: Needs assistance Sitting-balance support: Feet supported Sitting balance-Leahy Scale: Fair     Standing balance support: Bilateral upper extremity supported Standing balance-Leahy Scale: Poor Standing balance comment: reliant on BUE support                             Pertinent Vitals/Pain Pain Assessment: No/denies pain Faces Pain Scale: No hurt Pain Intervention(s): Monitored during session    Home Living Family/patient expects to be discharged to:: Private residence Living Arrangements: Children Available Help at Discharge: Family;Available 24 hours/day Type of Home: House Home Access: Level entry     Home Layout: Two level Home Equipment: Walker - 4 wheels;Shower seat;Grab bars - tub/shower;Cane -  single point      Prior Function Level of Independence: Independent with assistive device(s)         Comments: Pt reports use of Rollator inside of the home, able to complete ADLs, reports family does most  IADLs. pt does not drive.  Sounds as if he stays upstairs.     Hand Dominance   Dominant Hand: Right    Extremity/Trunk Assessment   Upper Extremity Assessment Upper Extremity Assessment: Defer to OT evaluation    Lower Extremity Assessment Lower Extremity Assessment: Generalized weakness    Cervical / Trunk Assessment Cervical / Trunk Assessment: Kyphotic  Communication   Communication: No difficulties  Cognition Arousal/Alertness: Awake/alert Behavior During Therapy: WFL for tasks assessed/performed Overall Cognitive Status: History of cognitive impairments - at baseline                                 General Comments: pt following simple commands, per H&P, pt is more confused than baseline. Pt stating he lives with his mother who cares for him      General Comments General comments (skin integrity, edema, etc.): VSS on RA. pt incontinent of BM during session, totalA to clean. RN alerted    Exercises     Assessment/Plan    PT Assessment Patient needs continued PT services  PT Problem List Decreased strength;Decreased range of motion;Decreased activity tolerance;Decreased balance;Decreased mobility;Decreased coordination;Decreased cognition;Decreased safety awareness       PT Treatment Interventions DME instruction;Gait training;Stair training;Functional mobility training;Therapeutic activities;Therapeutic exercise;Balance training;Patient/family education    PT Goals (Current goals can be found in the Care Plan section)  Acute Rehab PT Goals Patient Stated Goal: I'm ready to go home PT Goal Formulation: With patient Time For Goal Achievement: 06/18/20 Potential to Achieve Goals: Fair    Frequency Min 3X/week    AM-PAC PT "6 Clicks" Mobility  Outcome Measure Help needed turning from your back to your side while in a flat bed without using bedrails?: A Little Help needed moving from lying on your back to sitting on the side of a flat bed without  using bedrails?: A Little Help needed moving to and from a bed to a chair (including a wheelchair)?: A Lot Help needed standing up from a chair using your arms (e.g., wheelchair or bedside chair)?: A Lot Help needed to walk in hospital room?: A Little Help needed climbing 3-5 steps with a railing? : A Lot 6 Click Score: 15    End of Session Equipment Utilized During Treatment: Gait belt Activity Tolerance: Patient tolerated treatment well;Patient limited by fatigue Patient left: in chair;with call bell/phone within reach;with chair alarm set Nurse Communication: Mobility status PT Visit Diagnosis: Other abnormalities of gait and mobility (R26.89);Muscle weakness (generalized) (M62.81)    Time: [id number] PT Time Calculation (min) (ACUTE ONLY): 24 min   Charges:   PT Evaluation $PT Eval Low Complexity: 1 Low PT Treatments $Gait Training: 8-22 mins        Karma Ganja, PT, DPT   Acute Rehabilitation Department Pager #: [phone]  Otho Bellows 06/04/2020, 1:21 PM

## 2020-06-04 NOTE — Assessment & Plan Note (Addendum)
-  Previous CXR and current reviewed.  He does have a new left infrahilar opacity appreciated.  Given his change in mentation, fever, and tachypnea, prudent to treat for now -Covid/flu swabs negative on admission -Continued on Rocephin and azithromycin during hospitalization.  Deescalated to amoxicillin/doxycycline to complete course at discharge

## 2020-06-04 NOTE — Assessment & Plan Note (Addendum)
-   patient symptoms include AMS beyond baseline - etiology considered due to metabolic derangement from underlying pneumonia -Continue antibiotics and monitor mentation -Continues to have some hospital delirium which hopefully should improve as he moves out of the hospital to consistent surroundings
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
## 2020-06-04 NOTE — Assessment & Plan Note (Addendum)
-   fever 100.4 in ER but reported to be 101 by EMS as well. Also had tachycardia, tachypnea, mentation change beyond baseline on admission.  Source presumed pulmonary from pneumonia -See CAP

## 2020-06-04 NOTE — Progress Notes (Addendum)
PROGRESS NOTE    Harry [name]   VZD:[id number]  DOB: [birth year]  DOA: 06/03/2020     1  PCP: [name], MD  CC: AMS  Hospital Course: Harry [name] is a 75 year old male with PMH dementia, DM II, PVD, hypertension, hyperlipidemia, CKD3a who was brought to the hospital for worsening confusion above his baseline demented state.  His daughter has been positive for influenza A at home and due to his mental status change, he was brought in for further evaluation. Covid and flu swabs were negative on admission. He was found to have a fever upon EMS arrival (101 degrees).  He was treated with fluids and antibiotics on admission. CXR showed a left infrahilar opacity concerning for possible underlying pneumonia.   Interval History:  Seen this morning in his room.  Had pulled his gown off and laying in bed unclothed.  Able to tell me his name but not oriented to year, president, place.  Thought he was in [place].  Unsure what his usual baseline is with his background of dementia; but per daughter he was more confused than his usual state.  ROS: Review of systems not obtained due to patient factors.  Cognitive impairment  Assessment & Plan: * CAP (community acquired pneumonia) -Previous CXR and current reviewed.  He does have a new left infrahilar opacity appreciated.  Given his change in mentation, fever, and tachypnea, prudent to treat for now -Obtain respiratory virus panel; Covid/flu swabs negative on admission -Continue Rocephin and azithromycin  Severe sepsis (HCC) - fever 100.4 in ER but reported to be 101 by EMS as well. Also had tachycardia, tachypnea, mentation change beyond baseline on admission.  Source presumed pulmonary from pneumonia -See CAP  Acute metabolic encephalopathy - patient symptoms include AMS beyond baseline - etiology considered due to metabolic derangement from underlying pneumonia -Continue antibiotics and monitor mentation   Dementia without  behavioral disturbance (HCC) - continue safety precautions and bed alarm  Hypertensive urgency -Continue Coreg  Acute renal failure superimposed on stage 3a chronic kidney disease ([place]) - patient has history of CKD3a. Baseline creat ~ 1.5, eGFR 49-51 - patient presents with increase in creat >0.3 mg/dL above baseline presumed to have occurred within past 7 days PTA -Presumed prerenal from volume depletion in setting of acute illness -Received fluids on admission -Renal function has improved some -BMP daily   Hypocalcemia - s/p repletion on admission   Anemia of chronic disease -Baseline hemoglobin approximately 8 to 9 g/dL. -Essentially baseline on admission, small drop this morning; no obvious bleeding; possibly dilutional from fluids on admission -CBC daily  Type 2 diabetes mellitus with stage 3 chronic kidney disease, with long-term current use of insulin (HCC) - continue SSI and CBG monitoring    Old records reviewed in assessment of this patient  Antimicrobials: Vancomycin 4/1 x 1 Cefepime 4/1 x 1 Azithromycin 4/1 >> current Rocephin 4/1 >> current  Flagyl 4/1 >> current  DVT prophylaxis: Place and maintain sequential compression device Start: 06/03/20 1657   Code Status:   Code Status: Full Code Family Communication:   Disposition Plan: Status is: Inpatient  Remains inpatient appropriate because:Altered mental status, IV treatments appropriate due to intensity of illness or inability to take PO and Inpatient level of care appropriate due to severity of illness   Dispo: The patient is from: Home              Anticipated d/c is to: Home  Patient currently is not medically stable to d/c.   Difficult to place patient No  Risk of unplanned readmission score: Unplanned Admission- Pilot do not use: 21.23   Objective: Blood pressure (!) 180/85, pulse 83, temperature 98.4 F (36.9 C), resp. rate 19, height $RemoveBe'5\' 8"'DWSpvsWhS$  (1.727 m), weight 84.5 kg, SpO2 95 %.   Examination: General appearance: confused, laying in bed with gown pulled off and feet half off edge of bed with bed alarm going off; NAD Head: Normocephalic, without obvious abnormality, atraumatic Eyes: opacified left eye Lungs: coarse BS bilaterally Heart: regular rate and rhythm and S1, S2 normal Abdomen: normal findings: bowel sounds normal and soft, non-tender Extremities: no edema Skin: mobility and turgor normal Neurologic: Grossly normal  Consultants:     Procedures:     Data Reviewed: I have personally reviewed following labs and imaging studies Results for orders placed or performed during the hospital encounter of 06/03/20 (from the past 24 hour(s))  Urinalysis, Routine w reflex microscopic     Status: Abnormal   Collection Time: 06/03/20  2:20 PM  Result Value Ref Range   Color, Urine YELLOW YELLOW   APPearance CLEAR CLEAR   Specific Gravity, Urine 1.016 1.005 - 1.030   pH 5.0 5.0 - 8.0   Glucose, UA 50 (A) NEGATIVE mg/dL   Hgb urine dipstick SMALL (A) NEGATIVE   Bilirubin Urine NEGATIVE NEGATIVE   Ketones, ur NEGATIVE NEGATIVE mg/dL   Protein, ur NEGATIVE NEGATIVE mg/dL   Nitrite NEGATIVE NEGATIVE   Leukocytes,Ua NEGATIVE NEGATIVE   RBC / HPF 0-5 0 - 5 RBC/hpf   WBC, UA 0-5 0 - 5 WBC/hpf   Bacteria, UA NONE SEEN NONE SEEN   Squamous Epithelial / LPF 0-5 0 - 5   Mucus PRESENT   Resp Panel by RT-PCR (Flu A&B, Covid) Nasopharyngeal Swab     Status: None   Collection Time: 06/03/20  2:27 PM   Specimen: Nasopharyngeal Swab; Nasopharyngeal(NP) swabs in vial transport medium  Result Value Ref Range   SARS Coronavirus 2 by RT PCR NEGATIVE NEGATIVE   Influenza A by PCR NEGATIVE NEGATIVE   Influenza B by PCR NEGATIVE NEGATIVE  Lactic acid, plasma     Status: None   Collection Time: 06/03/20  2:27 PM  Result Value Ref Range   Lactic Acid, Venous 1.1 0.5 - 1.9 mmol/L  Type and screen [place]     Status: None   Collection Time: 06/03/20   8:10 PM  Result Value Ref Range   ABO/RH(D) B POS    Antibody Screen NEG    Sample Expiration      06/06/2020,2359 Performed at [facility], [address], [place], [place] [address]   ABO/Rh     Status: None   Collection Time: 06/03/20  8:12 PM  Result Value Ref Range   ABO/RH(D)      B POS Performed at [facility] [address], [place], [place] [address]   Hemoglobin A1c     Status: Abnormal   Collection Time: 06/04/20  3:46 AM  Result Value Ref Range   Hgb A1c MFr Bld 7.1 (H) 4.8 - 5.6 %   Mean Plasma Glucose 157.07 mg/dL  CBC     Status: Abnormal   Collection Time: 06/04/20  3:46 AM  Result Value Ref Range   WBC 4.2 4.0 - 10.5 K/uL   RBC 2.66 (L) 4.22 - 5.81 MIL/uL   Hemoglobin 7.7 (L) 13.0 - 17.0 g/dL  HCT 24.4 (L) 39.0 - 52.0 %   MCV 91.7 80.0 - 100.0 fL   MCH 28.9 26.0 - 34.0 pg   MCHC 31.6 30.0 - 36.0 g/dL   RDW 14.8 11.5 - 15.5 %   Platelets 159 150 - 400 K/uL   nRBC 0.0 0.0 - 0.2 %  Basic metabolic panel     Status: Abnormal   Collection Time: 06/04/20  3:46 AM  Result Value Ref Range   Sodium 134 (L) 135 - 145 mmol/L   Potassium 3.7 3.5 - 5.1 mmol/L   Chloride 106 98 - 111 mmol/L   CO2 21 (L) 22 - 32 mmol/L   Glucose, Bld 75 70 - 99 mg/dL   BUN 25 (H) 8 - 23 mg/dL   Creatinine, Ser 1.61 (H) 0.61 - 1.24 mg/dL   Calcium 8.3 (L) 8.9 - 10.3 mg/dL   GFR, Estimated 45 (L) >60 mL/min   Anion gap 7 5 - 15  Glucose, capillary     Status: None   Collection Time: 06/04/20  7:48 AM  Result Value Ref Range   Glucose-Capillary 90 70 - 99 mg/dL  Glucose, capillary     Status: Abnormal   Collection Time: 06/04/20 11:29 AM  Result Value Ref Range   Glucose-Capillary 114 (H) 70 - 99 mg/dL    Recent Results (from the past 240 hour(s))  Blood Culture (routine x 2)     Status: None (Preliminary result)   Collection Time: 06/03/20 12:40 PM   Specimen: BLOOD LEFT FOREARM  Result Value Ref Range Status   Specimen Description BLOOD LEFT FOREARM  Final    Special Requests   Final    BOTTLES DRAWN AEROBIC AND ANAEROBIC Blood Culture results may not be optimal due to an excessive volume of blood received in culture bottles   Culture   Final    NO GROWTH < 24 HOURS Performed at [facility], [address], [place], [place] [address]    Report Status PENDING  Incomplete  Blood Culture (routine x 2)     Status: None (Preliminary result)   Collection Time: 06/03/20 12:53 PM   Specimen: BLOOD RIGHT FOREARM  Result Value Ref Range Status   Specimen Description BLOOD RIGHT FOREARM  Final   Special Requests   Final    BOTTLES DRAWN AEROBIC AND ANAEROBIC Blood Culture adequate volume   Culture   Final    NO GROWTH < 24 HOURS Performed at [facility], [place] [address], [place], [place] [address]    Report Status PENDING  Incomplete  Resp Panel by RT-PCR (Flu A&B, Covid) Nasopharyngeal Swab     Status: None   Collection Time: 06/03/20  2:27 PM   Specimen: Nasopharyngeal Swab; Nasopharyngeal(NP) swabs in vial transport medium  Result Value Ref Range Status   SARS Coronavirus 2 by RT PCR NEGATIVE NEGATIVE Final    Comment: (NOTE) SARS-CoV-2 target nucleic acids are NOT DETECTED.  The SARS-CoV-2 RNA is generally detectable in upper respiratory specimens during the acute phase of infection. The lowest concentration of SARS-CoV-2 viral copies this assay can detect is 138 copies/mL. A negative result does not preclude SARS-Cov-2 infection and should not be used as the sole basis for treatment or other patient management decisions. A negative result may occur with  improper specimen collection/handling, submission of specimen other than nasopharyngeal swab, presence of viral mutation(s) within the areas targeted by this assay, and inadequate number of viral copies(<138 copies/mL). A negative result must  be combined with clinical observations, patient history, and epidemiological information. The expected result is Negative.  Fact  Sheet for Patients:  EntrepreneurPulse.com.au  Fact Sheet for Healthcare Providers:  IncredibleEmployment.be  This test is no t yet approved or cleared by the [place] FDA and  has been authorized for detection and/or diagnosis of SARS-CoV-2 by FDA under an Emergency Use Authorization (EUA). This EUA will remain  in effect (meaning this test can be used) for the duration of the COVID-19 declaration under Section 564(b)(1) of the Act, 21 U.S.C.section 360bbb-3(b)(1), unless the authorization is terminated  or revoked sooner.       Influenza A by PCR NEGATIVE NEGATIVE Final   Influenza B by PCR NEGATIVE NEGATIVE Final    Comment: (NOTE) The Xpert Xpress SARS-CoV-2/FLU/RSV plus assay is intended as an aid in the diagnosis of influenza from Nasopharyngeal swab specimens and should not be used as a sole basis for treatment. Nasal washings and aspirates are unacceptable for Xpert Xpress SARS-CoV-2/FLU/RSV testing.  Fact Sheet for Patients: EntrepreneurPulse.com.au  Fact Sheet for Healthcare Providers: IncredibleEmployment.be  This test is not yet approved or cleared by the [place] FDA and has been authorized for detection and/or diagnosis of SARS-CoV-2 by FDA under an Emergency Use Authorization (EUA). This EUA will remain in effect (meaning this test can be used) for the duration of the COVID-19 declaration under Section 564(b)(1) of the Act, 21 U.S.C. section 360bbb-3(b)(1), unless the authorization is terminated or revoked.  Performed at [facility], [place] [address], [place], [place] [address]      Radiology Studies: CT Head Wo Contrast  Result Date: 06/03/2020 CLINICAL DATA:  Mental status change, unknown cause. Increased altered mental status for 3 days. EXAM: CT HEAD WITHOUT CONTRAST TECHNIQUE: Contiguous axial images were obtained from the base of the skull through the vertex without  intravenous contrast. COMPARISON:  Prior brain MRI 10/27/2019. FINDINGS: Brain: Mild generalized parenchymal atrophy. Associated prominence of the ventricles and sulci. Redemonstrated chronic cortically based infarcts within the right parietal lobe and bilateral occipital lobes. Redemonstrated additional chronic infarcts within the right basal ganglia, bilateral thalami, within the pons and within the left greater than right cerebellar hemispheres. Stable background moderate chronic small vessel ischemic disease within the cerebral white matter and pons. There is no acute intracranial hemorrhage. No acute demarcated cortical infarct is identified No extra-axial fluid collection. No evidence of intracranial mass. No midline shift. Vascular: No hyperdense vessel.  Atherosclerotic calcifications. Skull: Normal. Negative for fracture or focal suspicious osseous lesion. Sinuses/Orbits: Visualized orbits show no acute finding. Paranasal sinus disease. Most notably, there is mild-to-moderate mucosal thickening within the bilateral ethmoid air cells and right sphenoid sinus and mild mucosal thickening within the bilateral maxillary sinuses. Additionally, there are small volume frothy secretions within the right sphenoid and maxillary sinuses. IMPRESSION: No evidence of acute intracranial abnormality. Generalized parenchymal atrophy with extensive chronic ischemia and multiple old infarcts, unchanged from the brain MRI of 10/27/2019. Paranasal sinus disease as described. Correlate for acute on chronic sinusitis. Electronically Signed   By: Kellie Simmering DO   On: 06/03/2020 14:17   DG Chest Port 1 View  Result Date: 06/03/2020 CLINICAL DATA:  Sepsis. EXAM: PORTABLE CHEST 1 VIEW COMPARISON:  October 20, 2019. FINDINGS: The heart size and mediastinal contours are within normal limits. Right lung is clear. Left infrahilar opacity is noted concerning for pneumonia or atelectasis. The visualized skeletal structures are  unremarkable. IMPRESSION: Mild left infrahilar opacity is noted concerning for  pneumonia or atelectasis. Electronically Signed   By: Marijo Conception M.D.   On: 06/03/2020 13:04   CT Head Wo Contrast  Final Result    DG Chest [facility]  Final Result      Scheduled Meds: . carvedilol  12.5 mg Oral QHS  . guaiFENesin  600 mg Oral BID  . insulin aspart  0-9 Units Subcutaneous TID WC  . insulin glargine  7 Units Subcutaneous QHS  . pyridostigmine  30 mg Oral Q8H  . rosuvastatin  5 mg Oral QPM  . sertraline  50 mg Oral Daily   PRN Meds: albuterol, hydrALAZINE, polyvinyl alcohol Continuous Infusions: . azithromycin 500 mg (06/03/20 2233)  . cefTRIAXone (ROCEPHIN)  IV 2 g (06/03/20 2234)     LOS: 1 day  Time spent: Greater than 50% of the 35 minute visit was spent in counseling/coordination of care for the patient as laid out in the A&P.   Dwyane Dee, MD [facility] 06/04/2020, 1:02 PM

## 2020-06-04 NOTE — Assessment & Plan Note (Signed)
-   s/p repletion on admission

## 2020-06-04 NOTE — Hospital Course (Addendum)
Harry [name] is a 75 year old male with PMH dementia, DM II, PVD, hypertension, hyperlipidemia, CKD3a who was brought to the hospital for worsening confusion above his baseline demented state.  His daughter has been positive for influenza A at home and due to his mental status change, he was brought in for further evaluation. Covid and flu swabs were negative on admission. He was found to have a fever upon EMS arrival (101 degrees).  He was treated with fluids and antibiotics on admission. CXR showed a left infrahilar opacity concerning for possible underlying pneumonia. RVP was also obtained which was positive for metapneumovirus.  Admission blood cultures grew 1/4 bottles with staph capitis which was considered contamination.  He remained clinically stable and was deescalated down to amoxicillin and doxycycline to complete course at discharge.  He was evaluated by physical therapy with recommendations for SNF at discharge and family accepted a bed at [facility] place.  He had some hospital-acquired delirium throughout hospitalization which should hopefully improve as he transitions out of the hospital to consistent surroundings.

## 2020-06-05 DIAGNOSIS — J208 Acute bronchitis due to other specified organisms: Secondary | ICD-10-CM

## 2020-06-05 DIAGNOSIS — R5381 Other malaise: Secondary | ICD-10-CM

## 2020-06-05 DIAGNOSIS — B9781 Human metapneumovirus as the cause of diseases classified elsewhere: Secondary | ICD-10-CM

## 2020-06-05 HISTORY — DX: Human metapneumovirus as the cause of diseases classified elsewhere: J20.8

## 2020-06-05 LAB — BLOOD CULTURE ID PANEL (REFLEXED) - BCID2

## 2020-06-05 LAB — URINE CULTURE: Culture: <10000 — AB

## 2020-06-05 LAB — GLUCOSE, CAPILLARY
Glucose-Capillary: 79 mg/dL (ref 70–99)
Glucose-Capillary: 79 mg/dL (ref 70–99)
Glucose-Capillary: 140 mg/dL — ABNORMAL HIGH (ref 70–99)
Glucose-Capillary: 101 mg/dL — ABNORMAL HIGH (ref 70–99)
Glucose-Capillary: 157 mg/dL — ABNORMAL HIGH (ref 70–99)

## 2020-06-05 LAB — CULTURE, BLOOD (ROUTINE X 2): Special Requests: ADEQUATE

## 2020-06-05 NOTE — Plan of Care (Signed)
  Problem: Nutrition: Goal: Adequate nutrition will be maintained Outcome: Progressing   

## 2020-06-05 NOTE — Progress Notes (Signed)
PHARMACY - PHYSICIAN COMMUNICATION CRITICAL VALUE ALERT - BLOOD CULTURE IDENTIFICATION (BCID)  FIELDS Harry [name] is an 75 y.o. male who presented to [facility] on 06/03/2020 with a chief complaint of AMS  Assessment:  1/4 BC with Staph species, likely contaminant  Name of physician (or Provider) Contacted: Olevia Bowens Current antibiotics: rocephin and azithromycin  Changes to prescribed antibiotics recommended:  none  Results for orders placed or performed during the hospital encounter of 06/03/20  Blood Culture ID Panel (Reflexed) (Collected: 06/03/2020 12:53 PM)  Result Value Ref Range   Enterococcus faecalis NOT DETECTED NOT DETECTED   Enterococcus Faecium NOT DETECTED NOT DETECTED   Listeria monocytogenes NOT DETECTED NOT DETECTED   Staphylococcus species DETECTED (A) NOT DETECTED   Staphylococcus aureus (BCID) NOT DETECTED NOT DETECTED   Staphylococcus epidermidis NOT DETECTED NOT DETECTED   Staphylococcus lugdunensis NOT DETECTED NOT DETECTED   Streptococcus species NOT DETECTED NOT DETECTED   Streptococcus agalactiae NOT DETECTED NOT DETECTED   Streptococcus pneumoniae NOT DETECTED NOT DETECTED   Streptococcus pyogenes NOT DETECTED NOT DETECTED   A.calcoaceticus-baumannii NOT DETECTED NOT DETECTED   Bacteroides fragilis NOT DETECTED NOT DETECTED   Enterobacterales NOT DETECTED NOT DETECTED   Enterobacter cloacae complex NOT DETECTED NOT DETECTED   Escherichia coli NOT DETECTED NOT DETECTED   Klebsiella aerogenes NOT DETECTED NOT DETECTED   Klebsiella oxytoca NOT DETECTED NOT DETECTED   Klebsiella pneumoniae NOT DETECTED NOT DETECTED   Proteus species NOT DETECTED NOT DETECTED   Salmonella species NOT DETECTED NOT DETECTED   Serratia marcescens NOT DETECTED NOT DETECTED   Haemophilus influenzae NOT DETECTED NOT DETECTED   Neisseria meningitidis NOT DETECTED NOT DETECTED   Pseudomonas aeruginosa NOT DETECTED NOT DETECTED   Stenotrophomonas maltophilia NOT DETECTED NOT DETECTED    Candida albicans NOT DETECTED NOT DETECTED   Candida auris NOT DETECTED NOT DETECTED   Candida glabrata NOT DETECTED NOT DETECTED   Candida krusei NOT DETECTED NOT DETECTED   Candida parapsilosis NOT DETECTED NOT DETECTED   Candida tropicalis NOT DETECTED NOT DETECTED   Cryptococcus neoformans/gattii NOT DETECTED NOT DETECTED    Beverlee Nims 06/05/2020  2:51 AM
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
## 2020-06-05 NOTE — Assessment & Plan Note (Addendum)
-  Likely combination of underlying dementia as well as presentation with pneumonia -PT has evaluated and recommending SNF; family is agreeable -Family has accepted bed at [facility]

## 2020-06-05 NOTE — Assessment & Plan Note (Signed)
-   supportive treatment

## 2020-06-05 NOTE — Progress Notes (Signed)
PROGRESS NOTE    Harry [name]   YDX:[id number]  DOB: 11-23-1945  DOA: 06/03/2020     2  PCP: [name], MD  CC: AMS  Hospital Course: Harry [name] is a 75 year old male with PMH dementia, DM II, PVD, hypertension, hyperlipidemia, CKD3a who was brought to the hospital for worsening confusion above his baseline demented state.  His daughter has been positive for influenza A at home and due to his mental status change, he was brought in for further evaluation. Covid and flu swabs were negative on admission. He was found to have a fever upon EMS arrival (101 degrees).  He was treated with fluids and antibiotics on admission. CXR showed a left infrahilar opacity concerning for possible underlying pneumonia.   Interval History:  No events overnight.  Sitting up in recliner when seen this morning.  Was able to tell me his name and current city which was a little better than yesterday.  Not oriented to any other questions.  Still having some cough/shortness of breath but seems to be overall improving still.  ROS: Review of systems not obtained due to patient factors.  Cognitive impairment  Assessment & Plan: * CAP (community acquired pneumonia) -Previous CXR and current reviewed.  He does have a new left infrahilar opacity appreciated.  Given his change in mentation, fever, and tachypnea, prudent to treat for now -Obtain respiratory virus panel; Covid/flu swabs negative on admission -Continue Rocephin and azithromycin  Severe sepsis (HCC) - fever 100.4 in ER but reported to be 101 by EMS as well. Also had tachycardia, tachypnea, mentation change beyond baseline on admission.  Source presumed pulmonary from pneumonia -See CAP  Acute metabolic encephalopathy - patient symptoms include AMS beyond baseline - etiology considered due to metabolic derangement from underlying pneumonia -Continue antibiotics and monitor mentation   Dementia without behavioral disturbance (HCC) - continue  safety precautions and bed alarm  Physical deconditioning -Likely combination of underlying dementia as well as presentation with pneumonia -PT has evaluated and recommending SNF; family is agreeable -Placement being pursued  Hypertensive urgency -Continue Coreg  Acute renal failure superimposed on stage 3a chronic kidney disease ([place]) - patient has history of CKD3a. Baseline creat ~ 1.5, eGFR 49-51 - patient presents with increase in creat >0.3 mg/dL above baseline presumed to have occurred within past 7 days PTA -Presumed prerenal from volume depletion in setting of acute illness -Received fluids on admission -Renal function has improved some -BMP daily   Hypocalcemia - s/p repletion on admission   Anemia of chronic disease -Baseline hemoglobin approximately 8 to 9 g/dL. -Essentially baseline on admission, small drop this morning; no obvious bleeding; possibly dilutional from fluids on admission -CBC daily  Type 2 diabetes mellitus with stage 3 chronic kidney disease, with long-term current use of insulin (HCC) - continue SSI and CBG monitoring    Old records reviewed in assessment of this patient  Antimicrobials: Vancomycin 4/1 x 1 Cefepime 4/1 x 1 Azithromycin 4/1 >> current Rocephin 4/1 >> current  Flagyl 4/1 >> current  DVT prophylaxis: Place and maintain sequential compression device Start: 06/03/20 1657   Code Status:   Code Status: Full Code Family Communication:   Disposition Plan: Status is: Inpatient  Remains inpatient appropriate because:Altered mental status, IV treatments appropriate due to intensity of illness or inability to take PO and Inpatient level of care appropriate due to severity of illness   Dispo: The patient is from: Home  Anticipated d/c is to: Home              Patient currently is not medically stable to d/c.   Difficult to place patient No  Risk of unplanned readmission score: Unplanned Admission- Pilot do not use:  20.37   Objective: Blood pressure 140/62, pulse 75, temperature 98.1 F (36.7 C), resp. rate 18, height $RemoveBe'5\' 8"'hJsIuKued$  (1.727 m), weight 84.5 kg, SpO2 97 %.  Examination: General appearance: Sitting up in recliner comfortably and noted stress.  Cooperative Head: Normocephalic, without obvious abnormality, atraumatic Eyes: opacified left eye Lungs: coarse BS bilaterally Heart: regular rate and rhythm and S1, S2 normal Abdomen: normal findings: bowel sounds normal and soft, non-tender Extremities: no edema Skin: mobility and turgor normal Neurologic: Grossly normal  Consultants:     Procedures:     Data Reviewed: I have personally reviewed following labs and imaging studies Results for orders placed or performed during the hospital encounter of 06/03/20 (from the past 24 hour(s))  Occult blood card to lab, stool RN will collect     Status: None   Collection Time: 06/04/20  4:07 PM  Result Value Ref Range   Fecal Occult Bld NEGATIVE NEGATIVE  Glucose, capillary     Status: Abnormal   Collection Time: 06/04/20  4:39 PM  Result Value Ref Range   Glucose-Capillary 107 (H) 70 - 99 mg/dL  Glucose, capillary     Status: Abnormal   Collection Time: 06/04/20  9:54 PM  Result Value Ref Range   Glucose-Capillary 210 (H) 70 - 99 mg/dL  Glucose, capillary     Status: None   Collection Time: 06/05/20  6:41 AM  Result Value Ref Range   Glucose-Capillary 79 70 - 99 mg/dL  Glucose, capillary     Status: None   Collection Time: 06/05/20  7:26 AM  Result Value Ref Range   Glucose-Capillary 79 70 - 99 mg/dL  Glucose, capillary     Status: Abnormal   Collection Time: 06/05/20 11:34 AM  Result Value Ref Range   Glucose-Capillary 140 (H) 70 - 99 mg/dL    Recent Results (from the past 240 hour(s))  Blood Culture (routine x 2)     Status: None (Preliminary result)   Collection Time: 06/03/20 12:40 PM   Specimen: BLOOD LEFT FOREARM  Result Value Ref Range Status   Specimen Description BLOOD LEFT  FOREARM  Final   Special Requests   Final    BOTTLES DRAWN AEROBIC AND ANAEROBIC Blood Culture results may not be optimal due to an excessive volume of blood received in culture bottles   Culture   Final    NO GROWTH 2 DAYS Performed at [facility], [place] [address], [place], [place] [address]    Report Status PENDING  Incomplete  Blood Culture (routine x 2)     Status: Abnormal   Collection Time: 06/03/20 12:53 PM   Specimen: BLOOD RIGHT FOREARM  Result Value Ref Range Status   Specimen Description BLOOD RIGHT FOREARM  Final   Special Requests   Final    BOTTLES DRAWN AEROBIC AND ANAEROBIC Blood Culture adequate volume   Culture  Setup Time   Final    GRAM POSITIVE COCCI IN CLUSTERS AEROBIC BOTTLE ONLY Organism ID to follow CRITICAL RESULT CALLED TO, READ BACK BY AND VERIFIED WITH: PHARMD LAURA SEAY BY MESSAN H. AT 0244 ON 06/04/2020    Culture (A)  Final    STAPHYLOCOCCUS CAPITIS THE SIGNIFICANCE OF ISOLATING THIS ORGANISM FROM  A SINGLE SET OF BLOOD CULTURES WHEN MULTIPLE SETS ARE DRAWN IS UNCERTAIN. PLEASE NOTIFY THE MICROBIOLOGY DEPARTMENT WITHIN ONE WEEK IF SPECIATION AND SENSITIVITIES ARE REQUIRED. Performed at [name], [place] [address] [place], [place] [address]    Report Status 06/05/2020 FINAL  Final  Blood Culture ID Panel (Reflexed)     Status: Abnormal   Collection Time: 06/03/20 12:53 PM  Result Value Ref Range Status   Enterococcus faecalis NOT DETECTED NOT DETECTED Final   Enterococcus Faecium NOT DETECTED NOT DETECTED Final   Listeria monocytogenes NOT DETECTED NOT DETECTED Final   Staphylococcus species DETECTED (A) NOT DETECTED Final    Comment: CRITICAL RESULT CALLED TO, READ BACK BY AND VERIFIED WITH: PHARMD LAURA SEAY BY MESSAN H. AT 0244 ON 06/04/2020    Staphylococcus aureus (BCID) NOT DETECTED NOT DETECTED Final   Staphylococcus epidermidis NOT DETECTED NOT DETECTED Final   Staphylococcus lugdunensis NOT DETECTED NOT DETECTED Final    Streptococcus species NOT DETECTED NOT DETECTED Final   Streptococcus agalactiae NOT DETECTED NOT DETECTED Final   Streptococcus pneumoniae NOT DETECTED NOT DETECTED Final   Streptococcus pyogenes NOT DETECTED NOT DETECTED Final   A.calcoaceticus-baumannii NOT DETECTED NOT DETECTED Final   Bacteroides fragilis NOT DETECTED NOT DETECTED Final   Enterobacterales NOT DETECTED NOT DETECTED Final   Enterobacter cloacae complex NOT DETECTED NOT DETECTED Final   Escherichia coli NOT DETECTED NOT DETECTED Final   Klebsiella aerogenes NOT DETECTED NOT DETECTED Final   Klebsiella oxytoca NOT DETECTED NOT DETECTED Final   Klebsiella pneumoniae NOT DETECTED NOT DETECTED Final   Proteus species NOT DETECTED NOT DETECTED Final   Salmonella species NOT DETECTED NOT DETECTED Final   Serratia marcescens NOT DETECTED NOT DETECTED Final   Haemophilus influenzae NOT DETECTED NOT DETECTED Final   Neisseria meningitidis NOT DETECTED NOT DETECTED Final   Pseudomonas aeruginosa NOT DETECTED NOT DETECTED Final   Stenotrophomonas maltophilia NOT DETECTED NOT DETECTED Final   Candida albicans NOT DETECTED NOT DETECTED Final   Candida auris NOT DETECTED NOT DETECTED Final   Candida glabrata NOT DETECTED NOT DETECTED Final   Candida krusei NOT DETECTED NOT DETECTED Final   Candida parapsilosis NOT DETECTED NOT DETECTED Final   Candida tropicalis NOT DETECTED NOT DETECTED Final   Cryptococcus neoformans/gattii NOT DETECTED NOT DETECTED Final    Comment: Performed at [facility], [address], [place], [place] [address]  Urine culture     Status: Abnormal   Collection Time: 06/03/20  2:20 PM   Specimen: In/Out Cath Urine  Result Value Ref Range Status   Specimen Description IN/OUT CATH URINE  Final   Special Requests NONE  Final   Culture (A)  Final    <10,000 COLONIES/mL INSIGNIFICANT GROWTH Performed at [facility], [place] [address], [place], [place] [address]    Report Status 06/05/2020 FINAL   Final  Resp Panel by RT-PCR (Flu A&B, Covid) Nasopharyngeal Swab     Status: None   Collection Time: 06/03/20  2:27 PM   Specimen: Nasopharyngeal Swab; Nasopharyngeal(NP) swabs in vial transport medium  Result Value Ref Range Status   SARS Coronavirus 2 by RT PCR NEGATIVE NEGATIVE Final    Comment: (NOTE) SARS-CoV-2 target nucleic acids are NOT DETECTED.  The SARS-CoV-2 RNA is generally detectable in upper respiratory specimens during the acute phase of infection. The lowest concentration of SARS-CoV-2 viral copies this assay can detect is 138 copies/mL. A negative result does not preclude SARS-Cov-2 infection and should  not be used as the sole basis for treatment or other patient management decisions. A negative result may occur with  improper specimen collection/handling, submission of specimen other than nasopharyngeal swab, presence of viral mutation(s) within the areas targeted by this assay, and inadequate number of viral copies(<138 copies/mL). A negative result must be combined with clinical observations, patient history, and epidemiological information. The expected result is Negative.  Fact Sheet for Patients:  EntrepreneurPulse.com.au  Fact Sheet for Healthcare Providers:  IncredibleEmployment.be  This test is no t yet approved or cleared by the [place] FDA and  has been authorized for detection and/or diagnosis of SARS-CoV-2 by FDA under an Emergency Use Authorization (EUA). This EUA will remain  in effect (meaning this test can be used) for the duration of the COVID-19 declaration under Section 564(b)(1) of the Act, 21 U.S.C.section 360bbb-3(b)(1), unless the authorization is terminated  or revoked sooner.       Influenza A by PCR NEGATIVE NEGATIVE Final   Influenza B by PCR NEGATIVE NEGATIVE Final    Comment: (NOTE) The Xpert Xpress SARS-CoV-2/FLU/RSV plus assay is intended as an aid in the diagnosis of influenza from  Nasopharyngeal swab specimens and should not be used as a sole basis for treatment. Nasal washings and aspirates are unacceptable for Xpert Xpress SARS-CoV-2/FLU/RSV testing.  Fact Sheet for Patients: EntrepreneurPulse.com.au  Fact Sheet for Healthcare Providers: IncredibleEmployment.be  This test is not yet approved or cleared by the [place] FDA and has been authorized for detection and/or diagnosis of SARS-CoV-2 by FDA under an Emergency Use Authorization (EUA). This EUA will remain in effect (meaning this test can be used) for the duration of the COVID-19 declaration under Section 564(b)(1) of the Act, 21 U.S.C. section 360bbb-3(b)(1), unless the authorization is terminated or revoked.  Performed at [facility], [place] [address] [place], [place] [address]   Respiratory (~20 pathogens) panel by PCR     Status: Abnormal   Collection Time: 06/04/20  8:09 AM   Specimen: Nasopharyngeal Swab; Respiratory  Result Value Ref Range Status   Adenovirus NOT DETECTED NOT DETECTED Final   Coronavirus 229E NOT DETECTED NOT DETECTED Final    Comment: (NOTE) The Coronavirus on the Respiratory Panel, DOES NOT test for the novel  Coronavirus (2019 nCoV)    Coronavirus HKU1 NOT DETECTED NOT DETECTED Final   Coronavirus NL63 NOT DETECTED NOT DETECTED Final   Coronavirus OC43 NOT DETECTED NOT DETECTED Final   Metapneumovirus DETECTED (A) NOT DETECTED Final   Rhinovirus / Enterovirus NOT DETECTED NOT DETECTED Final   Influenza A NOT DETECTED NOT DETECTED Final   Influenza B NOT DETECTED NOT DETECTED Final   Parainfluenza Virus 1 NOT DETECTED NOT DETECTED Final   Parainfluenza Virus 2 NOT DETECTED NOT DETECTED Final   Parainfluenza Virus 3 NOT DETECTED NOT DETECTED Final   Parainfluenza Virus 4 NOT DETECTED NOT DETECTED Final   Respiratory Syncytial Virus NOT DETECTED NOT DETECTED Final   Bordetella pertussis NOT DETECTED NOT DETECTED Final    Bordetella Parapertussis NOT DETECTED NOT DETECTED Final   Chlamydophila pneumoniae NOT DETECTED NOT DETECTED Final   Mycoplasma pneumoniae NOT DETECTED NOT DETECTED Final    Comment: Performed at [facility], [address] [place], [place] [address]     Radiology Studies: CT Head Wo Contrast  Result Date: 06/03/2020 CLINICAL DATA:  Mental status change, unknown cause. Increased altered mental status for 3 days. EXAM: CT HEAD WITHOUT CONTRAST TECHNIQUE: Contiguous axial images were obtained  from the base of the skull through the vertex without intravenous contrast. COMPARISON:  Prior brain MRI 10/27/2019. FINDINGS: Brain: Mild generalized parenchymal atrophy. Associated prominence of the ventricles and sulci. Redemonstrated chronic cortically based infarcts within the right parietal lobe and bilateral occipital lobes. Redemonstrated additional chronic infarcts within the right basal ganglia, bilateral thalami, within the pons and within the left greater than right cerebellar hemispheres. Stable background moderate chronic small vessel ischemic disease within the cerebral white matter and pons. There is no acute intracranial hemorrhage. No acute demarcated cortical infarct is identified No extra-axial fluid collection. No evidence of intracranial mass. No midline shift. Vascular: No hyperdense vessel.  Atherosclerotic calcifications. Skull: Normal. Negative for fracture or focal suspicious osseous lesion. Sinuses/Orbits: Visualized orbits show no acute finding. Paranasal sinus disease. Most notably, there is mild-to-moderate mucosal thickening within the bilateral ethmoid air cells and right sphenoid sinus and mild mucosal thickening within the bilateral maxillary sinuses. Additionally, there are small volume frothy secretions within the right sphenoid and maxillary sinuses. IMPRESSION: No evidence of acute intracranial abnormality. Generalized parenchymal atrophy with extensive chronic ischemia and  multiple old infarcts, unchanged from the brain MRI of 10/27/2019. Paranasal sinus disease as described. Correlate for acute on chronic sinusitis. Electronically Signed   By: Kellie Simmering DO   On: 06/03/2020 14:17   CT Head Wo Contrast  Final Result    DG Chest Port 1 View  Final Result      Scheduled Meds: . carvedilol  12.5 mg Oral QHS  . guaiFENesin  600 mg Oral BID  . insulin aspart  0-9 Units Subcutaneous TID WC  . insulin glargine  7 Units Subcutaneous QHS  . pyridostigmine  30 mg Oral Q8H  . rosuvastatin  5 mg Oral QPM  . sertraline  50 mg Oral Daily   PRN Meds: albuterol, hydrALAZINE, polyvinyl alcohol Continuous Infusions: . azithromycin 500 mg (06/04/20 2200)  . cefTRIAXone (ROCEPHIN)  IV 2 g (06/04/20 2201)     LOS: 2 days  Time spent: Greater than 50% of the 35 minute visit was spent in counseling/coordination of care for the patient as laid out in the A&P.   Dwyane Dee, MD [facility] 06/05/2020, 1:24 PM
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
## 2020-06-06 DIAGNOSIS — R799 Abnormal finding of blood chemistry, unspecified: Secondary | ICD-10-CM

## 2020-06-06 LAB — BASIC METABOLIC PANEL
Anion gap: 7 (ref 5–15)
BUN: 21 mg/dL (ref 8–23)
CO2: 25 mmol/L (ref 22–32)
Calcium: 8.3 mg/dL — ABNORMAL LOW (ref 8.9–10.3)
Chloride: 106 mmol/L (ref 98–111)
Creatinine, Ser: 1.54 mg/dL — ABNORMAL HIGH (ref 0.61–1.24)
GFR, Estimated: 47 mL/min — ABNORMAL LOW (ref >60)
Glucose, Bld: 128 mg/dL — ABNORMAL HIGH (ref 70–99)
Potassium: 3.5 mmol/L (ref 3.5–5.1)
Sodium: 138 mmol/L (ref 135–145)

## 2020-06-06 LAB — GLUCOSE, CAPILLARY
Glucose-Capillary: 76 mg/dL (ref 70–99)
Glucose-Capillary: 118 mg/dL — ABNORMAL HIGH (ref 70–99)
Glucose-Capillary: 125 mg/dL — ABNORMAL HIGH (ref 70–99)
Glucose-Capillary: 183 mg/dL — ABNORMAL HIGH (ref 70–99)

## 2020-06-06 LAB — CBC WITH DIFFERENTIAL/PLATELET
Abs Immature Granulocytes: 0.06 10*3/uL (ref 0.00–0.07)
Basophils Absolute: 0 10*3/uL (ref 0.0–0.1)
Basophils Relative: 0 %
Eosinophils Absolute: 0.3 10*3/uL (ref 0.0–0.5)
Eosinophils Relative: 6 %
HCT: 25.8 % — ABNORMAL LOW (ref 39.0–52.0)
Hemoglobin: 8.1 g/dL — ABNORMAL LOW (ref 13.0–17.0)
Immature Granulocytes: 1 %
Lymphocytes Relative: 36 %
Lymphs Abs: 1.6 10*3/uL (ref 0.7–4.0)
MCH: 29.1 pg (ref 26.0–34.0)
MCHC: 31.4 g/dL (ref 30.0–36.0)
MCV: 92.8 fL (ref 80.0–100.0)
Monocytes Absolute: 0.4 10*3/uL (ref 0.1–1.0)
Monocytes Relative: 10 %
Neutro Abs: 2.1 10*3/uL (ref 1.7–7.7)
Neutrophils Relative %: 47 %
Platelets: 190 10*3/uL (ref 150–400)
RBC: 2.78 MIL/uL — ABNORMAL LOW (ref 4.22–5.81)
RDW: 14.9 % (ref 11.5–15.5)
WBC: 4.5 10*3/uL (ref 4.0–10.5)
nRBC: 0 % (ref 0.0–0.2)

## 2020-06-06 LAB — MAGNESIUM: Magnesium: 1.6 mg/dL — ABNORMAL LOW (ref 1.7–2.4)

## 2020-06-06 LAB — SARS CORONAVIRUS 2 (TAT 6-24 HRS): SARS Coronavirus 2: NEGATIVE

## 2020-06-06 NOTE — TOC Progression Note (Addendum)
Transition of Care [facility]) - Progression Note    Patient Details  Name: Harry [name] MRN: [id number] Date of Birth: 11-12-1945  [facility]) CM/SW Contact  Sharlet Salina Mila Homer, LCSW Phone Number: 06/06/2020, 1:34 PM  Clinical Narrative:   Call made to patient's daughter [name] [phone]) and facility responses provided. CSW will await call back from [name] later today.  2:52 pm: Received call from daughter and [employer] chosen and she has talked with the admissions Mudlogger. [name] added that her dad has not been to a skilled nursing facility since October 2021. She added that admissions director had asked her about patient SNF history last year. 2:54 pm: [employer], admissions Mudlogger at [facility] and informed her that daughter wants [facility] and a private room. Also informed [name] that patient had not been to a SNF since October 2021.    CSW will continue to follow and facilitate discharge to [facility] when medically stable for discharge.    Expected Discharge Plan: Skilled Nursing Facility Barriers to Discharge: No Barriers Identified  Expected Discharge Plan and Services Expected Discharge Plan: [place] In-house Referral: Clinical Social Work     Living arrangements for the past 2 months: Single Family Home                                       Social Determinants of Health (SDOH) Interventions  No SDOH interventions requested or needed at this time  Readmission Risk Interventions Readmission Risk Prevention Plan 09/02/2018 08/14/2018  Transportation Screening Complete Complete  PCP or Specialist Appt within 3-5 Days - Complete  HRI or [place] - Not Complete  HRI or Home Care Consult comments - pt discharging to SNF  Social Work Consult for [place] Planning/Counseling - Not Complete  SW consult not completed comments - no needs  Palliative Care Screening - Not Applicable  Medication  Review Press photographer) - Referral to Pharmacy  PCP or Specialist appointment within 3-5 days of discharge Complete -  SW Recovery Care/Counseling Consult Complete -  Palliative Care Screening Not Applicable -  Skilled Nursing Facility Complete -  Some recent data might be hidden

## 2020-06-06 NOTE — Progress Notes (Signed)
Physical Therapy Treatment Patient Details Name: Harry [name] MRN: [id number] DOB: 06-05-1945 Today's Date: 06/06/2020    History of Present Illness The pt is a 75 yo male presenting from home with AMS on 4/1. Upon workup, pt found to be febrile, chest x-ray and concerning for pneumonia. PMH includes: HTN, HLD, DM II, CVA, PVD, CKD III, and dementia.    PT Comments    Pt received in bed, pleasantly confused. Needed min-mod A for all mobility. Cueing to initiate mobility tasks and for hand placement during transfers. Pt reporting dizziness upon standing. BP measured in sitting and standing at 0 min and pt found to have significant drop in systolic BP. Deferred gait. Pt left in chair with all needs met, call bell within reach, chair alarm active, and NT present in room. Would benefit from PT to decrease fall risk and increase independency. Will continue to follow acutely.  BP in sitting = 160/73  BP in standing at 0 min = 100/69 Pt unable to tolerate standing for 3 min    Follow Up Recommendations  SNF;Supervision/Assistance - 24 hour     Equipment Recommendations  Rolling walker with 5" wheels (defer to post acute)    Recommendations for Other Services       Precautions / Restrictions Precautions Precautions: Fall Precaution Comments: Incontinient of stool (and unaware), monitor orthostatics Restrictions Weight Bearing Restrictions: No    Mobility  Bed Mobility Overal bed mobility: Needs Assistance Bed Mobility: Supine to Sit     Supine to sit: Min assist     General bed mobility comments: No assist to bring trunk upright, min A to scoot forward to EOB    Transfers Overall transfer level: Needs assistance Equipment used: Rolling walker (2 wheeled) Transfers: Sit to/from Stand Sit to Stand: Mod assist;From elevated surface Stand pivot transfers: Mod assist       General transfer comment: Unable to stand on 1st attempt, mod A to boost into standing with bed  elevated, cueing for handplacement  Ambulation/Gait             General Gait Details: deferred due to dizziness   Stairs             Wheelchair Mobility    Modified Rankin (Stroke Patients Only)       Balance Overall balance assessment: Needs assistance Sitting-balance support: Feet supported Sitting balance-Leahy Scale: Fair     Standing balance support: Bilateral upper extremity supported Standing balance-Leahy Scale: Poor Standing balance comment: reliant on BUE support                            Cognition Arousal/Alertness: Awake/alert Behavior During Therapy: WFL for tasks assessed/performed Overall Cognitive Status: History of cognitive impairments - at baseline                                 General Comments: Per H&P he is more confused than normal.  Follows simple one step commands      Exercises      General Comments        Pertinent Vitals/Pain Pain Assessment: No/denies pain Pain Intervention(s): Monitored during session;Repositioned    Home Living                      Prior Function            PT Goals (current  goals can now be found in the care plan section) Acute Rehab PT Goals Patient Stated Goal: I'm ready to go home PT Goal Formulation: With patient Time For Goal Achievement: 06/18/20 Potential to Achieve Goals: Fair    Frequency    Min 3X/week      PT Plan      Co-evaluation              AM-PAC PT "6 Clicks" Mobility   Outcome Measure  Help needed turning from your back to your side while in a flat bed without using bedrails?: A Little Help needed moving from lying on your back to sitting on the side of a flat bed without using bedrails?: A Little Help needed moving to and from a bed to a chair (including a wheelchair)?: A Lot Help needed standing up from a chair using your arms (e.g., wheelchair or bedside chair)?: A Lot Help needed to walk in hospital room?: A  Lot Help needed climbing 3-5 steps with a railing? : A Lot 6 Click Score: 14    End of Session Equipment Utilized During Treatment: Gait belt Activity Tolerance: Patient limited by fatigue;Other (comment) (Limited by dizziness) Patient left: in chair;with call bell/phone within reach;with chair alarm set Nurse Communication: Mobility status PT Visit Diagnosis: Other abnormalities of gait and mobility (R26.89);Muscle weakness (generalized) (M62.81)     Time:  -     Charges:                        Rosita Kea, SPT

## 2020-06-06 NOTE — Assessment & Plan Note (Signed)
-  1/4 bottles from admission growing staph capitis.  This is considered contamination -No further work-up or intervention indicated at this time

## 2020-06-06 NOTE — Progress Notes (Signed)
PROGRESS NOTE    Harry [name]   XIH:[id number]  DOB: 16-Dec-1945  DOA: 06/03/2020     3  PCP: [name], MD  CC: AMS  Hospital Course: Harry [name] is a 75 year old male with PMH dementia, DM II, PVD, hypertension, hyperlipidemia, CKD3a who was brought to the hospital for worsening confusion above his baseline demented state.  His daughter has been positive for influenza A at home and due to his mental status change, he was brought in for further evaluation. Covid and flu swabs were negative on admission. He was found to have a fever upon EMS arrival (101 degrees).  He was treated with fluids and antibiotics on admission. CXR showed a left infrahilar opacity concerning for possible underlying pneumonia. RVP was also obtained which was positive for metapneumovirus.  Admission blood cultures grew 1/4 bottles with staph capitis which was considered contamination.   Interval History:  Patient appeared a little more confused today when seen.  Daughter had also talked to him and was concerned about the confusion.  Called and spoke with her; suspicion for now is that he has some hospital-acquired delirium especially with his underlying dementia, he is at increased risk for developing this. Plan for now is to continue current treatment plan in place and await bed offers/acceptance from SNF.  He would be able to go as early as tomorrow if facility found.  ROS: Review of systems not obtained due to patient factors.  Cognitive impairment  Assessment & Plan: * CAP (community acquired pneumonia) -Previous CXR and current reviewed.  He does have a new left infrahilar opacity appreciated.  Given his change in mentation, fever, and tachypnea, prudent to treat for now -Covid/flu swabs negative on admission -Continue Rocephin and azithromycin  Severe sepsis (HCC) - fever 100.4 in ER but reported to be 101 by EMS as well. Also had tachycardia, tachypnea, mentation change beyond baseline on  admission.  Source presumed pulmonary from pneumonia -See CAP  Acute metabolic encephalopathy - patient symptoms include AMS beyond baseline - etiology considered due to metabolic derangement from underlying pneumonia -Continue antibiotics and monitor mentation -Slightly more confused today, likely component of hospital delirium.  Updated daughter.  Continue monitoring mentation   Dementia without behavioral disturbance ([place]) - continue safety precautions and bed alarm  Acute bronchitis due to human metapneumovirus - supportive treatment  Physical deconditioning -Likely combination of underlying dementia as well as presentation with pneumonia -PT has evaluated and recommending SNF; family is agreeable -Placement being pursued  Acute renal failure superimposed on stage 3a chronic kidney disease ([place]) - patient has history of CKD3a. Baseline creat ~ 1.5, eGFR 49-51 - patient presents with increase in creat >0.3 mg/dL above baseline presumed to have occurred within past 7 days PTA -Presumed prerenal from volume depletion in setting of acute illness -Received fluids on admission -Renal function has improved some -BMP daily   Hypertensive urgency-resolved as of 06/06/2020 -Continue Coreg  Hypocalcemia - s/p repletion on admission   Anemia of chronic disease -Baseline hemoglobin approximately 8 to 9 g/dL. -Essentially baseline on admission, small drop this morning; no obvious bleeding; possibly dilutional from fluids on admission -CBC daily  Type 2 diabetes mellitus with stage 3 chronic kidney disease, with long-term current use of insulin (HCC) - continue SSI and CBG monitoring   Contamination of blood culture-resolved as of 06/06/2020 -1/4 bottles from admission growing staph capitis.  This is considered contamination -No further work-up or intervention indicated at this time   Old records reviewed  in assessment of this patient  Antimicrobials: Vancomycin 4/1 x 1 Cefepime  4/1 x 1 Azithromycin 4/1 >> current Rocephin 4/1 >> current  Flagyl 4/1 >> current  DVT prophylaxis: Place and maintain sequential compression device Start: 06/03/20 1657   Code Status:   Code Status: Full Code Family Communication: Daughter  Disposition Plan: Status is: Inpatient  Remains inpatient appropriate because:Altered mental status, IV treatments appropriate due to intensity of illness or inability to take PO and Inpatient level of care appropriate due to severity of illness   Dispo: The patient is from: Home              Anticipated d/c is to: Home              Patient currently is not medically stable to d/c.   Difficult to place patient No  Risk of unplanned readmission score: Unplanned Admission- Pilot do not use: 18.41   Objective: Blood pressure (!) 163/81, pulse 86, temperature 98.4 F (36.9 C), temperature source Oral, resp. rate 18, height _0  (1.727 m), weight 84.5 kg, SpO2 94 %.  Examination: General appearance: resting in bed in NAD; more confused today Head: Normocephalic, without obvious abnormality, atraumatic Eyes: opacified left eye Lungs: coarse BS bilaterally Heart: regular rate and rhythm and S1, S2 normal Abdomen: normal findings: bowel sounds normal and soft, non-tender Extremities: no edema Skin: mobility and turgor normal Neurologic: Moves all 4 extremities and follows commands.  Oriented to name only this am  Consultants:     Procedures:     Data Reviewed: I have personally reviewed following labs and imaging studies Results for orders placed or performed during the hospital encounter of 06/03/20 (from the past 24 hour(s))  Glucose, capillary     Status: Abnormal   Collection Time: 06/05/20  4:50 PM  Result Value Ref Range   Glucose-Capillary 101 (H) 70 - 99 mg/dL  Glucose, capillary     Status: Abnormal   Collection Time: 06/05/20  9:29 PM  Result Value Ref Range   Glucose-Capillary 157 (H) 70 - 99 mg/dL  Glucose, capillary      Status: None   Collection Time: 06/06/20  6:43 AM  Result Value Ref Range   Glucose-Capillary 76 70 - 99 mg/dL  Basic metabolic panel     Status: Abnormal   Collection Time: 06/06/20  8:36 AM  Result Value Ref Range   Sodium 138 135 - 145 mmol/L   Potassium 3.5 3.5 - 5.1 mmol/L   Chloride 106 98 - 111 mmol/L   CO2 25 22 - 32 mmol/L   Glucose, Bld 128 (H) 70 - 99 mg/dL   BUN 21 8 - 23 mg/dL   Creatinine, Ser 1.54 (H) 0.61 - 1.24 mg/dL   Calcium 8.3 (L) 8.9 - 10.3 mg/dL   GFR, Estimated 47 (L) >60 mL/min   Anion gap 7 5 - 15  CBC with Differential/Platelet     Status: Abnormal   Collection Time: 06/06/20  8:36 AM  Result Value Ref Range   WBC 4.5 4.0 - 10.5 K/uL   RBC 2.78 (L) 4.22 - 5.81 MIL/uL   Hemoglobin 8.1 (L) 13.0 - 17.0 g/dL   HCT 25.8 (L) 39.0 - 52.0 %   MCV 92.8 80.0 - 100.0 fL   MCH 29.1 26.0 - 34.0 pg   MCHC 31.4 30.0 - 36.0 g/dL   RDW 14.9 11.5 - 15.5 %   Platelets 190 150 - 400 K/uL   nRBC 0.0 0.0 -  0.2 %   Neutrophils Relative % 47 %   Neutro Abs 2.1 1.7 - 7.7 K/uL   Lymphocytes Relative 36 %   Lymphs Abs 1.6 0.7 - 4.0 K/uL   Monocytes Relative 10 %   Monocytes Absolute 0.4 0.1 - 1.0 K/uL   Eosinophils Relative 6 %   Eosinophils Absolute 0.3 0.0 - 0.5 K/uL   Basophils Relative 0 %   Basophils Absolute 0.0 0.0 - 0.1 K/uL   WBC Morphology See Note    Immature Granulocytes 1 %   Abs Immature Granulocytes 0.06 0.00 - 0.07 K/uL  Magnesium     Status: Abnormal   Collection Time: 06/06/20  8:36 AM  Result Value Ref Range   Magnesium 1.6 (L) 1.7 - 2.4 mg/dL  Glucose, capillary     Status: Abnormal   Collection Time: 06/06/20 11:53 AM  Result Value Ref Range   Glucose-Capillary 118 (H) 70 - 99 mg/dL    Recent Results (from the past 240 hour(s))  Blood Culture (routine x 2)     Status: None (Preliminary result)   Collection Time: 06/03/20 12:40 PM   Specimen: BLOOD LEFT FOREARM  Result Value Ref Range Status   Specimen Description BLOOD LEFT FOREARM   Final   Special Requests   Final    BOTTLES DRAWN AEROBIC AND ANAEROBIC Blood Culture results may not be optimal due to an excessive volume of blood received in culture bottles   Culture   Final    NO GROWTH 2 DAYS Performed at [facility], [place] [address] [place], [place] [address]    Report Status PENDING  Incomplete  Blood Culture (routine x 2)     Status: Abnormal   Collection Time: 06/03/20 12:53 PM   Specimen: BLOOD RIGHT FOREARM  Result Value Ref Range Status   Specimen Description BLOOD RIGHT FOREARM  Final   Special Requests   Final    BOTTLES DRAWN AEROBIC AND ANAEROBIC Blood Culture adequate volume   Culture  Setup Time   Final    GRAM POSITIVE COCCI IN CLUSTERS AEROBIC BOTTLE ONLY Organism ID to follow CRITICAL RESULT CALLED TO, READ BACK BY AND VERIFIED WITH: PHARMD LAURA SEAY BY MESSAN H. AT 0244 ON 06/04/2020    Culture (A)  Final    STAPHYLOCOCCUS CAPITIS THE SIGNIFICANCE OF ISOLATING THIS ORGANISM FROM A SINGLE SET OF BLOOD CULTURES WHEN MULTIPLE SETS ARE DRAWN IS UNCERTAIN. PLEASE NOTIFY THE MICROBIOLOGY DEPARTMENT WITHIN ONE WEEK IF SPECIATION AND SENSITIVITIES ARE REQUIRED. Performed at [facility], [place] [address], [place], [place] [address]    Report Status 06/05/2020 FINAL  Final  Blood Culture ID Panel (Reflexed)     Status: Abnormal   Collection Time: 06/03/20 12:53 PM  Result Value Ref Range Status   Enterococcus faecalis NOT DETECTED NOT DETECTED Final   Enterococcus Faecium NOT DETECTED NOT DETECTED Final   Listeria monocytogenes NOT DETECTED NOT DETECTED Final   Staphylococcus species DETECTED (A) NOT DETECTED Final    Comment: CRITICAL RESULT CALLED TO, READ BACK BY AND VERIFIED WITH: PHARMD LAURA SEAY BY MESSAN H. AT 0244 ON 06/04/2020    Staphylococcus aureus (BCID) NOT DETECTED NOT DETECTED Final   Staphylococcus epidermidis NOT DETECTED NOT DETECTED Final   Staphylococcus lugdunensis NOT DETECTED NOT DETECTED Final   Streptococcus  species NOT DETECTED NOT DETECTED Final   Streptococcus agalactiae NOT DETECTED NOT DETECTED Final   Streptococcus pneumoniae NOT DETECTED NOT DETECTED Final   Streptococcus pyogenes NOT DETECTED  NOT DETECTED Final   A.calcoaceticus-baumannii NOT DETECTED NOT DETECTED Final   Bacteroides fragilis NOT DETECTED NOT DETECTED Final   Enterobacterales NOT DETECTED NOT DETECTED Final   Enterobacter cloacae complex NOT DETECTED NOT DETECTED Final   Escherichia coli NOT DETECTED NOT DETECTED Final   Klebsiella aerogenes NOT DETECTED NOT DETECTED Final   Klebsiella oxytoca NOT DETECTED NOT DETECTED Final   Klebsiella pneumoniae NOT DETECTED NOT DETECTED Final   Proteus species NOT DETECTED NOT DETECTED Final   Salmonella species NOT DETECTED NOT DETECTED Final   Serratia marcescens NOT DETECTED NOT DETECTED Final   Haemophilus influenzae NOT DETECTED NOT DETECTED Final   Neisseria meningitidis NOT DETECTED NOT DETECTED Final   Pseudomonas aeruginosa NOT DETECTED NOT DETECTED Final   Stenotrophomonas maltophilia NOT DETECTED NOT DETECTED Final   Candida albicans NOT DETECTED NOT DETECTED Final   Candida auris NOT DETECTED NOT DETECTED Final   Candida glabrata NOT DETECTED NOT DETECTED Final   Candida krusei NOT DETECTED NOT DETECTED Final   Candida parapsilosis NOT DETECTED NOT DETECTED Final   Candida tropicalis NOT DETECTED NOT DETECTED Final   Cryptococcus neoformans/gattii NOT DETECTED NOT DETECTED Final    Comment: Performed at [facility], [place]. [address], [place], [place] [address]  Urine culture     Status: Abnormal   Collection Time: 06/03/20  2:20 PM   Specimen: In/Out Cath Urine  Result Value Ref Range Status   Specimen Description IN/OUT CATH URINE  Final   Special Requests NONE  Final   Culture (A)  Final    <10,000 COLONIES/mL INSIGNIFICANT GROWTH Performed at [facility], [place] [address], [place],  [address]    Report Status 06/05/2020 FINAL  Final  Resp  Panel by RT-PCR (Flu A&B, Covid) Nasopharyngeal Swab     Status: None   Collection Time: 06/03/20  2:27 PM   Specimen: Nasopharyngeal Swab; Nasopharyngeal(NP) swabs in vial transport medium  Result Value Ref Range Status   SARS Coronavirus 2 by RT PCR NEGATIVE NEGATIVE Final    Comment: (NOTE) SARS-CoV-2 target nucleic acids are NOT DETECTED.  The SARS-CoV-2 RNA is generally detectable in upper respiratory specimens during the acute phase of infection. The lowest concentration of SARS-CoV-2 viral copies this assay can detect is 138 copies/mL. A negative result does not preclude SARS-Cov-2 infection and should not be used as the sole basis for treatment or other patient management decisions. A negative result may occur with  improper specimen collection/handling, submission of specimen other than nasopharyngeal swab, presence of viral mutation(s) within the areas targeted by this assay, and inadequate number of viral copies(<138 copies/mL). A negative result must be combined with clinical observations, patient history, and epidemiological information. The expected result is Negative.  Fact Sheet for Patients:  EntrepreneurPulse.com.au  Fact Sheet for Healthcare Providers:  IncredibleEmployment.be  This test is no t yet approved or cleared by the [place] FDA and  has been authorized for detection and/or diagnosis of SARS-CoV-2 by FDA under an Emergency Use Authorization (EUA). This EUA will remain  in effect (meaning this test can be used) for the duration of the COVID-19 declaration under Section 564(b)(1) of the Act, 21 U.S.C.section 360bbb-3(b)(1), unless the authorization is terminated  or revoked sooner.       Influenza A by PCR NEGATIVE NEGATIVE Final   Influenza B by PCR NEGATIVE NEGATIVE Final    Comment: (NOTE) The Xpert Xpress SARS-CoV-2/FLU/RSV plus assay is intended as an aid in the diagnosis of  influenza from Nasopharyngeal  swab specimens and should not be used as a sole basis for treatment. Nasal washings and aspirates are unacceptable for Xpert Xpress SARS-CoV-2/FLU/RSV testing.  Fact Sheet for Patients: EntrepreneurPulse.com.au  Fact Sheet for Healthcare Providers: IncredibleEmployment.be  This test is not yet approved or cleared by the [place] FDA and has been authorized for detection and/or diagnosis of SARS-CoV-2 by FDA under an Emergency Use Authorization (EUA). This EUA will remain in effect (meaning this test can be used) for the duration of the COVID-19 declaration under Section 564(b)(1) of the Act, 21 U.S.C. section 360bbb-3(b)(1), unless the authorization is terminated or revoked.  Performed at [facility], [place] [address], [place], [place] [address]   Respiratory (~20 pathogens) panel by PCR     Status: Abnormal   Collection Time: 06/04/20  8:09 AM   Specimen: Nasopharyngeal Swab; Respiratory  Result Value Ref Range Status   Adenovirus NOT DETECTED NOT DETECTED Final   Coronavirus 229E NOT DETECTED NOT DETECTED Final    Comment: (NOTE) The Coronavirus on the Respiratory Panel, DOES NOT test for the novel  Coronavirus (2019 nCoV)    Coronavirus HKU1 NOT DETECTED NOT DETECTED Final   Coronavirus NL63 NOT DETECTED NOT DETECTED Final   Coronavirus OC43 NOT DETECTED NOT DETECTED Final   Metapneumovirus DETECTED (A) NOT DETECTED Final   Rhinovirus / Enterovirus NOT DETECTED NOT DETECTED Final   Influenza A NOT DETECTED NOT DETECTED Final   Influenza B NOT DETECTED NOT DETECTED Final   Parainfluenza Virus 1 NOT DETECTED NOT DETECTED Final   Parainfluenza Virus 2 NOT DETECTED NOT DETECTED Final   Parainfluenza Virus 3 NOT DETECTED NOT DETECTED Final   Parainfluenza Virus 4 NOT DETECTED NOT DETECTED Final   Respiratory Syncytial Virus NOT DETECTED NOT DETECTED Final   Bordetella pertussis NOT DETECTED NOT DETECTED Final   Bordetella  Parapertussis NOT DETECTED NOT DETECTED Final   Chlamydophila pneumoniae NOT DETECTED NOT DETECTED Final   Mycoplasma pneumoniae NOT DETECTED NOT DETECTED Final    Comment: Performed at [facility], [address] [place], [place] [address]     Radiology Studies: No results found. CT Head Wo Contrast  Final Result    DG Chest Port 1 View  Final Result      Scheduled Meds: . carvedilol  12.5 mg Oral QHS  . guaiFENesin  600 mg Oral BID  . insulin aspart  0-9 Units Subcutaneous TID WC  . insulin glargine  7 Units Subcutaneous QHS  . pyridostigmine  30 mg Oral Q8H  . rosuvastatin  5 mg Oral QPM  . sertraline  50 mg Oral Daily   PRN Meds: albuterol, hydrALAZINE, polyvinyl alcohol Continuous Infusions: . azithromycin 500 mg (06/05/20 2259)  . cefTRIAXone (ROCEPHIN)  IV 2 g (06/05/20 2201)     LOS: 3 days  Time spent: Greater than 50% of the 35 minute visit was spent in counseling/coordination of care for the patient as laid out in the A&P.   Dwyane Dee, MD [facility] 06/06/2020, 1:00 PM

## 2020-06-07 ENCOUNTER — Other Ambulatory Visit: Payer: Self-pay

## 2020-06-07 DIAGNOSIS — I739 Peripheral vascular disease, unspecified: Secondary | ICD-10-CM | POA: Diagnosis not present

## 2020-06-07 DIAGNOSIS — Z23 Encounter for immunization: Secondary | ICD-10-CM | POA: Diagnosis not present

## 2020-06-07 DIAGNOSIS — B9781 Human metapneumovirus as the cause of diseases classified elsewhere: Secondary | ICD-10-CM | POA: Diagnosis not present

## 2020-06-07 DIAGNOSIS — J211 Acute bronchiolitis due to human metapneumovirus: Secondary | ICD-10-CM | POA: Diagnosis not present

## 2020-06-07 DIAGNOSIS — Z794 Long term (current) use of insulin: Secondary | ICD-10-CM | POA: Diagnosis not present

## 2020-06-07 DIAGNOSIS — Z8673 Personal history of transient ischemic attack (TIA), and cerebral infarction without residual deficits: Secondary | ICD-10-CM | POA: Diagnosis not present

## 2020-06-07 DIAGNOSIS — N183 Chronic kidney disease, stage 3 unspecified: Secondary | ICD-10-CM | POA: Diagnosis not present

## 2020-06-07 DIAGNOSIS — R2689 Other abnormalities of gait and mobility: Secondary | ICD-10-CM | POA: Diagnosis not present

## 2020-06-07 DIAGNOSIS — R2681 Unsteadiness on feet: Secondary | ICD-10-CM | POA: Diagnosis not present

## 2020-06-07 DIAGNOSIS — D638 Anemia in other chronic diseases classified elsewhere: Secondary | ICD-10-CM | POA: Diagnosis not present

## 2020-06-07 DIAGNOSIS — M6281 Muscle weakness (generalized): Secondary | ICD-10-CM | POA: Diagnosis not present

## 2020-06-07 DIAGNOSIS — J159 Unspecified bacterial pneumonia: Secondary | ICD-10-CM | POA: Diagnosis not present

## 2020-06-07 DIAGNOSIS — N179 Acute kidney failure, unspecified: Secondary | ICD-10-CM | POA: Diagnosis not present

## 2020-06-07 DIAGNOSIS — N1831 Chronic kidney disease, stage 3a: Secondary | ICD-10-CM

## 2020-06-07 DIAGNOSIS — F17200 Nicotine dependence, unspecified, uncomplicated: Secondary | ICD-10-CM | POA: Diagnosis not present

## 2020-06-07 DIAGNOSIS — M2550 Pain in unspecified joint: Secondary | ICD-10-CM | POA: Diagnosis not present

## 2020-06-07 DIAGNOSIS — H269 Unspecified cataract: Secondary | ICD-10-CM | POA: Diagnosis not present

## 2020-06-07 DIAGNOSIS — H04123 Dry eye syndrome of bilateral lacrimal glands: Secondary | ICD-10-CM | POA: Diagnosis not present

## 2020-06-07 DIAGNOSIS — G903 Multi-system degeneration of the autonomic nervous system: Secondary | ICD-10-CM | POA: Diagnosis not present

## 2020-06-07 DIAGNOSIS — J208 Acute bronchitis due to other specified organisms: Secondary | ICD-10-CM

## 2020-06-07 DIAGNOSIS — J209 Acute bronchitis, unspecified: Secondary | ICD-10-CM | POA: Diagnosis not present

## 2020-06-07 DIAGNOSIS — N189 Chronic kidney disease, unspecified: Secondary | ICD-10-CM | POA: Diagnosis not present

## 2020-06-07 DIAGNOSIS — R5381 Other malaise: Secondary | ICD-10-CM | POA: Diagnosis not present

## 2020-06-07 DIAGNOSIS — I10 Essential (primary) hypertension: Secondary | ICD-10-CM | POA: Diagnosis not present

## 2020-06-07 DIAGNOSIS — R41841 Cognitive communication deficit: Secondary | ICD-10-CM | POA: Diagnosis not present

## 2020-06-07 DIAGNOSIS — A4189 Other specified sepsis: Secondary | ICD-10-CM | POA: Diagnosis not present

## 2020-06-07 DIAGNOSIS — R55 Syncope and collapse: Secondary | ICD-10-CM | POA: Diagnosis not present

## 2020-06-07 DIAGNOSIS — Z0189 Encounter for other specified special examinations: Secondary | ICD-10-CM | POA: Diagnosis not present

## 2020-06-07 DIAGNOSIS — E1159 Type 2 diabetes mellitus with other circulatory complications: Secondary | ICD-10-CM | POA: Diagnosis not present

## 2020-06-07 DIAGNOSIS — R1312 Dysphagia, oropharyngeal phase: Secondary | ICD-10-CM | POA: Diagnosis not present

## 2020-06-07 DIAGNOSIS — I951 Orthostatic hypotension: Secondary | ICD-10-CM | POA: Diagnosis not present

## 2020-06-07 DIAGNOSIS — R6520 Severe sepsis without septic shock: Secondary | ICD-10-CM | POA: Diagnosis not present

## 2020-06-07 DIAGNOSIS — F0390 Unspecified dementia without behavioral disturbance: Secondary | ICD-10-CM | POA: Diagnosis not present

## 2020-06-07 DIAGNOSIS — Z20828 Contact with and (suspected) exposure to other viral communicable diseases: Secondary | ICD-10-CM | POA: Diagnosis not present

## 2020-06-07 DIAGNOSIS — Z7401 Bed confinement status: Secondary | ICD-10-CM | POA: Diagnosis not present

## 2020-06-07 DIAGNOSIS — D649 Anemia, unspecified: Secondary | ICD-10-CM | POA: Diagnosis not present

## 2020-06-07 DIAGNOSIS — R058 Other specified cough: Secondary | ICD-10-CM | POA: Diagnosis not present

## 2020-06-07 DIAGNOSIS — R531 Weakness: Secondary | ICD-10-CM | POA: Diagnosis not present

## 2020-06-07 DIAGNOSIS — E119 Type 2 diabetes mellitus without complications: Secondary | ICD-10-CM | POA: Diagnosis not present

## 2020-06-07 DIAGNOSIS — R42 Dizziness and giddiness: Secondary | ICD-10-CM | POA: Diagnosis not present

## 2020-06-07 DIAGNOSIS — F330 Major depressive disorder, recurrent, mild: Secondary | ICD-10-CM | POA: Diagnosis not present

## 2020-06-07 DIAGNOSIS — J189 Pneumonia, unspecified organism: Secondary | ICD-10-CM | POA: Diagnosis not present

## 2020-06-07 DIAGNOSIS — F0280 Dementia in other diseases classified elsewhere without behavioral disturbance: Secondary | ICD-10-CM | POA: Diagnosis not present

## 2020-06-07 DIAGNOSIS — E782 Mixed hyperlipidemia: Secondary | ICD-10-CM | POA: Diagnosis not present

## 2020-06-07 DIAGNOSIS — N178 Other acute kidney failure: Secondary | ICD-10-CM | POA: Diagnosis not present

## 2020-06-07 DIAGNOSIS — G3183 Dementia with Lewy bodies: Secondary | ICD-10-CM | POA: Diagnosis not present

## 2020-06-07 DIAGNOSIS — I152 Hypertension secondary to endocrine disorders: Secondary | ICD-10-CM | POA: Diagnosis not present

## 2020-06-07 DIAGNOSIS — R262 Difficulty in walking, not elsewhere classified: Secondary | ICD-10-CM | POA: Diagnosis not present

## 2020-06-07 DIAGNOSIS — G9341 Metabolic encephalopathy: Secondary | ICD-10-CM | POA: Diagnosis not present

## 2020-06-07 LAB — GLUCOSE, CAPILLARY
Glucose-Capillary: 66 mg/dL — ABNORMAL LOW (ref 70–99)
Glucose-Capillary: 85 mg/dL (ref 70–99)
Glucose-Capillary: 112 mg/dL — ABNORMAL HIGH (ref 70–99)
Glucose-Capillary: 157 mg/dL — ABNORMAL HIGH (ref 70–99)

## 2020-06-07 MED ORDER — CARVEDILOL 12.5 MG PO TABS
12.5000 mg | ORAL_TABLET | Freq: Two times a day (BID) | ORAL | Status: DC
  Administered 2020-06-07 (×2): 12.5 mg via ORAL
  Filled 2020-06-07 (×2): qty 1

## 2020-06-07 MED ORDER — AMOXICILLIN 875 MG PO TABS: 875.0000 mg | ORAL_TABLET | Freq: Two times a day (BID) | ORAL | 0 refills | Status: AC

## 2020-06-07 MED ORDER — CARVEDILOL 12.5 MG PO TABS: 12.5000 mg | ORAL_TABLET | Freq: Two times a day (BID) | ORAL | Status: DC

## 2020-06-07 MED ORDER — DOXYCYCLINE HYCLATE 100 MG PO CAPS: 100.0000 mg | ORAL_CAPSULE | Freq: Two times a day (BID) | ORAL | 0 refills | Status: AC

## 2020-06-07 NOTE — Consult Note (Signed)
   [facility] Inpatient Consult   06/07/2020  BRANSON HANNULA 11/26/1945 UL:[id number]   [facility] [ACO] Patient:  Patient will be followed by [facility] PAC with traditional Medicare for any known or needs for transitional care needs for returning to post facility care or complex disease management.  For questions or referrals, please contact:   Natividad Brood, RN BSN [facility] Liaison  [phone] business mobile phone Toll free office [phone]  Fax number: [phone] [email or web address]

## 2020-06-07 NOTE — TOC Transition Note (Signed)
Transition of Care [facility]) - CM/SW Discharge Note *Discharged to [facility] *Room 601-P   Patient Details  Name: Harry [name] MRN: [id number] Date of Birth: May 09, 1945  Transition of Care [facility]) CM/SW Contact:  Sable Feil, LCSW Phone Number: 06/07/2020, 12:41 PM   Clinical Narrative:  Patient medially stable for discharge and going to [facility] for [place] rehab. Discharge clinicals transmitted to facility, and nurse provided with information to call report. Received a call from daughter [name] [phone]) regarding patient's discharge. Patient's COVID latest COVID test 4/4 was negative.     Final next level of care: [place] ([place]) Barriers to Discharge: No Barriers Identified   Patient Goals and CMS Choice Patient states their goals for this hospitalization and ongoing recovery are:: Patient and daughter agreeable to ST rehab at discharge from hospital CMS Medicare.gov Compare Post Acute Care list provided to:: Patient Represenative (must comment) Choice offered to / list presented to : Adult Children  Discharge Placement   Existing PASRR number confirmed : 06/05/20          Patient chooses bed at: [facility] Patient to be transferred to facility by: Non-emergency ambulance transport Name of family member notified: [name] - [phone]. Daughter called CSW today regarding discharge Patient and family notified of of transfer: 06/07/20  Discharge Plan and Services In-house Referral: Clinical Social Work                                  Social Determinants of Health (SDOH) Interventions  No SDOH interventions requested or needed at discharge   Readmission Risk Interventions Readmission Risk Prevention Plan 09/02/2018 08/14/2018  Transportation Screening Complete Complete  PCP or Specialist Appt within 3-5 Days - Complete  HRI or [place] - Not Complete  HRI or Home Care Consult comments - pt  discharging to SNF  Social Work Consult for [place] Planning/Counseling - Not Complete  SW consult not completed comments - no needs  Palliative Care Screening - Not Applicable  Medication Review [employer]) - Referral to Pharmacy  PCP or Specialist appointment within 3-5 days of discharge Complete -  SW Recovery Care/Counseling Consult Complete -  Palliative Care Screening Not Applicable -  Skilled Nursing Facility Complete -  Some recent data might be hidden

## 2020-06-07 NOTE — Discharge Summary (Signed)
Physician Discharge Summary   Harry [name] XAJ:[id number] DOB: [birth year] DOA: 06/03/2020  PCP: [name], MD  Admit date: 06/03/2020 Discharge date:    Admitted From: home Disposition:  SNF Discharging physician: Dwyane Dee, MD  Recommendations for Outpatient Follow-up:  1. Lantus discontinued due to hypoglycemia.  A1c 7.1% 2. Coreg increased to 12.5 mg twice daily at discharge due to daytime hypertension as well; previously on nightly dosing only   Patient discharged to SNF in Discharge Condition: stable Risk of unplanned readmission score: Unplanned Admission- [place] do not use: 18.44  CODE STATUS: Full Diet recommendation:  Diet Orders (From admission, onward)    Start     Ordered   06/07/20 0000  Diet - low sodium heart healthy        06/07/20 1147   06/07/20 0000  Diet Carb Modified        06/07/20 1147   06/03/20 1648  Diet heart healthy/carb modified Room service appropriate? Yes; Fluid consistency: Thin  Diet effective now       Question Answer Comment  Diet-HS Snack? Nothing   Room service appropriate? Yes   Fluid consistency: Thin      06/03/20 1648          Hospital Course: [name] is a 75 year old male with PMH dementia, DM II, PVD, hypertension, hyperlipidemia, CKD3a who was brought to the hospital for worsening confusion above his baseline demented state.  His daughter has been positive for influenza A at home and due to his mental status change, he was brought in for further evaluation. Covid and flu swabs were negative on admission. He was found to have a fever upon EMS arrival (101 degrees).  He was treated with fluids and antibiotics on admission. CXR showed a left infrahilar opacity concerning for possible underlying pneumonia. RVP was also obtained which was positive for metapneumovirus.  Admission blood cultures grew 1/4 bottles with staph capitis which was considered contamination.  He remained clinically stable and was deescalated down to  amoxicillin and doxycycline to complete course at discharge.  He was evaluated by physical therapy with recommendations for SNF at discharge and family accepted a bed at [facility].  He had some hospital-acquired delirium throughout hospitalization which should hopefully improve as he transitions out of the hospital to consistent surroundings.   * CAP (community acquired pneumonia) -Previous CXR and current reviewed.  He does have a new left infrahilar opacity appreciated.  Given his change in mentation, fever, and tachypnea, prudent to treat for now -Covid/flu swabs negative on admission -Continued on Rocephin and azithromycin during hospitalization.  Deescalated to amoxicillin/doxycycline to complete course at discharge  Acute metabolic encephalopathy - patient symptoms include AMS beyond baseline - etiology considered due to metabolic derangement from underlying pneumonia -Continue antibiotics and monitor mentation -Continues to have some hospital delirium which hopefully should improve as he moves out of the hospital to consistent surroundings  Dementia without behavioral disturbance ([place]) - continue safety precautions and bed alarm while in hospital   Severe sepsis (HCC)-resolved as of 06/07/2020 - fever 100.4 in ER but reported to be 101 by EMS as well. Also had tachycardia, tachypnea, mentation change beyond baseline on admission.  Source presumed pulmonary from pneumonia -See CAP  Acute bronchitis due to human metapneumovirus - supportive treatment  Physical deconditioning -Likely combination of underlying dementia as well as presentation with pneumonia -PT has evaluated and recommending SNF; family is agreeable -Family has accepted bed at [facility]  Acute renal failure superimposed  on stage 3a chronic kidney disease ([place]) - patient has history of CKD3a. Baseline creat ~ 1.5, eGFR 49-51 - patient presents with increase in creat >0.3 mg/dL above baseline presumed to have  occurred within past 7 days PTA -Presumed prerenal from volume depletion in setting of acute illness -Received fluids on admission -Renal function has returned to baseline   Hypertensive urgency-resolved as of 06/06/2020 -Continue Coreg  Hypocalcemia - s/p repletion on admission   Anemia of chronic disease -Baseline hemoglobin approximately 8 to 9 g/dL. -Remains at baseline at discharge, 8.1 g/dL  Type 2 diabetes mellitus with stage 3 chronic kidney disease, with long-term current use of insulin (HCC) -A1c 7.1% -Lantus discontinued; suspect should be okay without Lantus, if does start to elevate outpatient, could start resuming at lower dose -Continue on sliding scale  Contamination of blood culture-resolved as of 06/06/2020 -1/4 bottles from admission growing staph capitis.  This is considered contamination -No further work-up or intervention indicated at this time    Principal Diagnosis: CAP (community acquired pneumonia)  Discharge Diagnoses: Active Hospital Problems   Diagnosis Date Noted  . CAP (community acquired pneumonia) 06/03/2020    Priority: High  . Acute metabolic encephalopathy 66/29/4765    Priority: High  . Dementia without behavioral disturbance ([place]) 08/14/2018    Priority: High  . Physical deconditioning 06/05/2020    Priority: Medium  . Acute bronchitis due to human metapneumovirus 06/05/2020    Priority: Medium  . Acute renal failure superimposed on stage 3a chronic kidney disease ([place]) 05/25/2016    Priority: Medium  . Anemia of chronic disease 06/03/2020  . Hypocalcemia 06/03/2020  . Type 2 diabetes mellitus with stage 3 chronic kidney disease, with long-term current use of insulin ([place]) 12/14/2005    Resolved Hospital Problems   Diagnosis Date Noted Date Resolved  . Severe sepsis ([place]) 06/04/2020 06/07/2020    Priority: High  . Hypertensive urgency 06/03/2020 06/06/2020    Priority: Medium  . Contamination of blood culture 06/06/2020  06/06/2020    Discharge Instructions    Diet - low sodium heart healthy   Complete by: As directed    Diet Carb Modified   Complete by: As directed    Increase activity slowly   Complete by: As directed      Allergies as of 06/07/2020      Reactions   Lipitor [atorvastatin] Other (See Comments)   Myalgia   Statins Other (See Comments)   Myalgia (CAN tolerate Crestor, however)   Pravachol [pravastatin] Rash      Medication List    STOP taking these medications   Basaglar KwikPen 100 UNIT/ML     TAKE these medications   amoxicillin 875 MG tablet Commonly known as: AMOXIL Take 1 tablet (875 mg total) by mouth 2 (two) times daily for 3 days.   aspirin 81 MG EC tablet Take 1 tablet (81 mg total) by mouth every evening.   carvedilol 12.5 MG tablet Commonly known as: COREG Take 1 tablet (12.5 mg total) by mouth 2 (two) times daily with a meal. What changed:   when to take this  Another medication with the same name was removed. Continue taking this medication, and follow the directions you see here.   clopidogrel 75 MG tablet Commonly known as: PLAVIX Take 75 mg by mouth daily.   doxycycline 100 MG capsule Commonly known as: VIBRAMYCIN Take 1 capsule (100 mg total) by mouth 2 (two) times daily for 3 days.   insulin aspart 100 UNIT/ML  injection Commonly known as: novoLOG Inject 0-9 Units into the skin 3 (three) times daily with meals. CBG 121 - 150: 1 unit CBG 151 - 200: 2 units CBG 201 - 250: 3 units CBG 251 - 300: 5 units CBG 301 - 350: 7 units CBG 351 - 400 9 units CBG > 400 call MD and obtain STAT lab verification   polyvinyl alcohol 1.4 % ophthalmic solution Commonly known as: LIQUIFILM TEARS Place 1 drop into both eyes as needed for dry eyes.   pyridostigmine 60 MG tablet Commonly known as: MESTINON Take 0.5 tablets (30 mg total) by mouth every 8 (eight) hours.   rosuvastatin 5 MG tablet Commonly known as: CRESTOR Take 1 tablet (5 mg total) by mouth  every evening.   sertraline 50 MG tablet Commonly known as: ZOLOFT Take 50 mg by mouth daily.       Allergies  Allergen Reactions  . Lipitor [Atorvastatin] Other (See Comments)    Myalgia   . Statins Other (See Comments)    Myalgia (CAN tolerate Crestor, however)  . Pravachol [Pravastatin] Rash    Consultations: n/a  Discharge Exam: BP 134/70   Pulse 79   Temp 98.5 F (36.9 C) (Oral)   Resp 18   Ht 5\' 8"  (1.727 m)   Wt 84.5 kg   SpO2 100%   BMI 28.33 kg/m  General appearance: resting in bed in NAD; more confused today Head: Normocephalic, without obvious abnormality, atraumatic Eyes: opacified left eye Lungs: coarse BS bilaterally Heart: regular rate and rhythm and S1, S2 normal Abdomen: normal findings: bowel sounds normal and soft, non-tender Extremities: no edema Skin: mobility and turgor normal Neurologic: Moves all 4 extremities and follows commands.  Oriented to name only this am  The results of significant diagnostics from this hospitalization (including imaging, microbiology, ancillary and laboratory) are listed below for reference.   Microbiology: Recent Results (from the past 240 hour(s))  Blood Culture (routine x 2)     Status: None (Preliminary result)   Collection Time: 06/03/20 12:40 PM   Specimen: BLOOD LEFT FOREARM  Result Value Ref Range Status   Specimen Description BLOOD LEFT FOREARM  Final   Special Requests   Final    BOTTLES DRAWN AEROBIC AND ANAEROBIC Blood Culture results may not be optimal due to an excessive volume of blood received in culture bottles   Culture   Final    NO GROWTH 4 DAYS Performed at [facility], [address] [place], [place]    Report Status PENDING  Incomplete  Blood Culture (routine x 2)     Status: Abnormal   Collection Time: 06/03/20 12:53 PM   Specimen: BLOOD RIGHT FOREARM  Result Value Ref Range Status   Specimen Description BLOOD RIGHT FOREARM  Final   Special Requests   Final     BOTTLES DRAWN AEROBIC AND ANAEROBIC Blood Culture adequate volume   Culture  Setup Time   Final    GRAM POSITIVE COCCI IN CLUSTERS AEROBIC BOTTLE ONLY Organism ID to follow CRITICAL RESULT CALLED TO, READ BACK BY AND VERIFIED WITH: PHARMD LAURA SEAY BY MESSAN H. AT 0244 ON 06/04/2020    Culture (A)  Final    STAPHYLOCOCCUS CAPITIS THE SIGNIFICANCE OF ISOLATING THIS ORGANISM FROM A SINGLE SET OF BLOOD CULTURES WHEN MULTIPLE SETS ARE DRAWN IS UNCERTAIN. PLEASE NOTIFY THE MICROBIOLOGY DEPARTMENT WITHIN ONE WEEK IF SPECIATION AND SENSITIVITIES ARE REQUIRED. Performed at [facility], [address] [place],  [place] [address]    Report Status 06/05/2020 FINAL  Final  Blood Culture ID Panel (Reflexed)     Status: Abnormal   Collection Time: 06/03/20 12:53 PM  Result Value Ref Range Status   Enterococcus faecalis NOT DETECTED NOT DETECTED Final   Enterococcus Faecium NOT DETECTED NOT DETECTED Final   Listeria monocytogenes NOT DETECTED NOT DETECTED Final   Staphylococcus species DETECTED (A) NOT DETECTED Final    Comment: CRITICAL RESULT CALLED TO, READ BACK BY AND VERIFIED WITH: PHARMD LAURA SEAY BY MESSAN H. AT 4034 ON 06/04/2020    Staphylococcus aureus (BCID) NOT DETECTED NOT DETECTED Final   Staphylococcus epidermidis NOT DETECTED NOT DETECTED Final   Staphylococcus lugdunensis NOT DETECTED NOT DETECTED Final   Streptococcus species NOT DETECTED NOT DETECTED Final   Streptococcus agalactiae NOT DETECTED NOT DETECTED Final   Streptococcus pneumoniae NOT DETECTED NOT DETECTED Final   Streptococcus pyogenes NOT DETECTED NOT DETECTED Final   A.calcoaceticus-baumannii NOT DETECTED NOT DETECTED Final   Bacteroides fragilis NOT DETECTED NOT DETECTED Final   Enterobacterales NOT DETECTED NOT DETECTED Final   Enterobacter cloacae complex NOT DETECTED NOT DETECTED Final   Escherichia coli NOT DETECTED NOT DETECTED Final   Klebsiella aerogenes NOT DETECTED NOT DETECTED Final   Klebsiella  oxytoca NOT DETECTED NOT DETECTED Final   Klebsiella pneumoniae NOT DETECTED NOT DETECTED Final   Proteus species NOT DETECTED NOT DETECTED Final   Salmonella species NOT DETECTED NOT DETECTED Final   Serratia marcescens NOT DETECTED NOT DETECTED Final   Haemophilus influenzae NOT DETECTED NOT DETECTED Final   Neisseria meningitidis NOT DETECTED NOT DETECTED Final   Pseudomonas aeruginosa NOT DETECTED NOT DETECTED Final   Stenotrophomonas maltophilia NOT DETECTED NOT DETECTED Final   Candida albicans NOT DETECTED NOT DETECTED Final   Candida auris NOT DETECTED NOT DETECTED Final   Candida glabrata NOT DETECTED NOT DETECTED Final   Candida krusei NOT DETECTED NOT DETECTED Final   Candida parapsilosis NOT DETECTED NOT DETECTED Final   Candida tropicalis NOT DETECTED NOT DETECTED Final   Cryptococcus neoformans/gattii NOT DETECTED NOT DETECTED Final    Comment: Performed at [facility], [address] [place], [place] [address]  Urine culture     Status: Abnormal   Collection Time: 06/03/20  2:20 PM   Specimen: In/Out Cath Urine  Result Value Ref Range Status   Specimen Description IN/OUT CATH URINE  Final   Special Requests NONE  Final   Culture (A)  Final    <10,000 COLONIES/mL INSIGNIFICANT GROWTH Performed at [facility], [place] [address] [place], [place] [address]    Report Status 06/05/2020 FINAL  Final  Resp Panel by RT-PCR (Flu A&B, Covid) Nasopharyngeal Swab     Status: None   Collection Time: 06/03/20  2:27 PM   Specimen: Nasopharyngeal Swab; Nasopharyngeal(NP) swabs in vial transport medium  Result Value Ref Range Status   SARS Coronavirus 2 by RT PCR NEGATIVE NEGATIVE Final    Comment: (NOTE) SARS-CoV-2 target nucleic acids are NOT DETECTED.  The SARS-CoV-2 RNA is generally detectable in upper respiratory specimens during the acute phase of infection. The lowest concentration of SARS-CoV-2 viral copies this assay can detect is 138 copies/mL. A negative  result does not preclude SARS-Cov-2 infection and should not be used as the sole basis for treatment or other patient management decisions. A negative result may occur with  improper specimen collection/handling, submission of specimen other than nasopharyngeal swab, presence of viral mutation(s) within the areas  targeted by this assay, and inadequate number of viral copies(<138 copies/mL). A negative result must be combined with clinical observations, patient history, and epidemiological information. The expected result is Negative.  Fact Sheet for Patients:  EntrepreneurPulse.com.au  Fact Sheet for Healthcare Providers:  IncredibleEmployment.be  This test is no t yet approved or cleared by the [place] FDA and  has been authorized for detection and/or diagnosis of SARS-CoV-2 by FDA under an Emergency Use Authorization (EUA). This EUA will remain  in effect (meaning this test can be used) for the duration of the COVID-19 declaration under Section 564(b)(1) of the Act, 21 U.S.C.section 360bbb-3(b)(1), unless the authorization is terminated  or revoked sooner.       Influenza A by PCR NEGATIVE NEGATIVE Final   Influenza B by PCR NEGATIVE NEGATIVE Final    Comment: (NOTE) The Xpert Xpress SARS-CoV-2/FLU/RSV plus assay is intended as an aid in the diagnosis of influenza from Nasopharyngeal swab specimens and should not be used as a sole basis for treatment. Nasal washings and aspirates are unacceptable for Xpert Xpress SARS-CoV-2/FLU/RSV testing.  Fact Sheet for Patients: EntrepreneurPulse.com.au  Fact Sheet for Healthcare Providers: IncredibleEmployment.be  This test is not yet approved or cleared by the [place] FDA and has been authorized for detection and/or diagnosis of SARS-CoV-2 by FDA under an Emergency Use Authorization (EUA). This EUA will remain in effect (meaning this test can be used)  for the duration of the COVID-19 declaration under Section 564(b)(1) of the Act, 21 U.S.C. section 360bbb-3(b)(1), unless the authorization is terminated or revoked.  Performed at [facility], [place] [address] [place], [place] [address]   Respiratory (~20 pathogens) panel by PCR     Status: Abnormal   Collection Time: 06/04/20  8:09 AM   Specimen: Nasopharyngeal Swab; Respiratory  Result Value Ref Range Status   Adenovirus NOT DETECTED NOT DETECTED Final   Coronavirus 229E NOT DETECTED NOT DETECTED Final    Comment: (NOTE) The Coronavirus on the Respiratory Panel, DOES NOT test for the novel  Coronavirus (2019 nCoV)    Coronavirus HKU1 NOT DETECTED NOT DETECTED Final   Coronavirus NL63 NOT DETECTED NOT DETECTED Final   Coronavirus OC43 NOT DETECTED NOT DETECTED Final   Metapneumovirus DETECTED (A) NOT DETECTED Final   Rhinovirus / Enterovirus NOT DETECTED NOT DETECTED Final   Influenza A NOT DETECTED NOT DETECTED Final   Influenza B NOT DETECTED NOT DETECTED Final   Parainfluenza Virus 1 NOT DETECTED NOT DETECTED Final   Parainfluenza Virus 2 NOT DETECTED NOT DETECTED Final   Parainfluenza Virus 3 NOT DETECTED NOT DETECTED Final   Parainfluenza Virus 4 NOT DETECTED NOT DETECTED Final   Respiratory Syncytial Virus NOT DETECTED NOT DETECTED Final   Bordetella pertussis NOT DETECTED NOT DETECTED Final   Bordetella Parapertussis NOT DETECTED NOT DETECTED Final   Chlamydophila pneumoniae NOT DETECTED NOT DETECTED Final   Mycoplasma pneumoniae NOT DETECTED NOT DETECTED Final    Comment: Performed at [facility], [address] [place], [place] [address]  SARS CORONAVIRUS 2 (TAT 6-24 HRS) Nasopharyngeal Nasopharyngeal Swab     Status: None   Collection Time: 06/06/20  3:26 PM   Specimen: Nasopharyngeal Swab  Result Value Ref Range Status   SARS Coronavirus 2 NEGATIVE NEGATIVE Final    Comment: (NOTE) SARS-CoV-2 target nucleic acids are NOT DETECTED.  The SARS-CoV-2  RNA is generally detectable in upper and lower respiratory specimens during the acute phase of infection. Negative results do not  preclude SARS-CoV-2 infection, do not rule out co-infections with other pathogens, and should not be used as the sole basis for treatment or other patient management decisions. Negative results must be combined with clinical observations, patient history, and epidemiological information. The expected result is Negative.  Fact Sheet for Patients: SugarRoll.be  Fact Sheet for Healthcare Providers: https://www.woods-mathews.com/  This test is not yet approved or cleared by the [place] FDA and  has been authorized for detection and/or diagnosis of SARS-CoV-2 by FDA under an Emergency Use Authorization (EUA). This EUA will remain  in effect (meaning this test can be used) for the duration of the COVID-19 declaration under Se ction 564(b)(1) of the Act, 21 U.S.C. section 360bbb-3(b)(1), unless the authorization is terminated or revoked sooner.  Performed at [facility], [place] [address] [place], [place] [address]      Labs: BNP (last 3 results) No results for input(s): BNP in the last 8760 hours. Basic Metabolic Panel: Recent Labs  Lab 06/03/20 1229 06/04/20 0346 06/06/20 0836  NA 139 134* 138  K 3.8 3.7 3.5  CL 110 106 106  CO2 23 21* 25  GLUCOSE 109* 75 128*  BUN 31* 25* 21  CREATININE 1.82* 1.61* 1.54*  CALCIUM 8.3* 8.3* 8.3*  MG  --   --  1.6*   Liver Function Tests: Recent Labs  Lab 06/03/20 1229  AST 25  ALT 23  ALKPHOS 50  BILITOT 1.0  PROT 6.6  ALBUMIN 3.3*   No results for input(s): LIPASE, AMYLASE in the last 168 hours. No results for input(s): AMMONIA in the last 168 hours. CBC: Recent Labs  Lab 06/03/20 1229 06/04/20 0346 06/06/20 0836  WBC 4.7 4.2 4.5  NEUTROABS 2.8  --  2.1  HGB 8.3*  8.1* 7.7* 8.1*  HCT 26.8*  26.7* 24.4* 25.8*  MCV 95.0 91.7 92.8  PLT 194  159 190   Cardiac Enzymes: No results for input(s): CKTOTAL, CKMB, CKMBINDEX, TROPONINI in the last 168 hours. BNP: Invalid input(s): POCBNP CBG: Recent Labs  Lab 06/06/20 1153 06/06/20 1712 06/06/20 2146 06/07/20 0632 06/07/20 0701  GLUCAP 118* 125* 183* 66* 85   D-Dimer No results for input(s): DDIMER in the last 72 hours. Hgb A1c No results for input(s): HGBA1C in the last 72 hours. Lipid Profile No results for input(s): CHOL, HDL, LDLCALC, TRIG, CHOLHDL, LDLDIRECT in the last 72 hours. Thyroid function studies No results for input(s): TSH, T4TOTAL, T3FREE, THYROIDAB in the last 72 hours.  Invalid input(s): FREET3 Anemia work up No results for input(s): VITAMINB12, FOLATE, FERRITIN, TIBC, IRON, RETICCTPCT in the last 72 hours. Urinalysis    Component Value Date/Time   COLORURINE YELLOW 06/03/2020 1420   APPEARANCEUR CLEAR 06/03/2020 1420   LABSPEC 1.016 06/03/2020 1420   PHURINE 5.0 06/03/2020 1420   GLUCOSEU 50 (A) 06/03/2020 1420   HGBUR SMALL (A) 06/03/2020 1420   BILIRUBINUR NEGATIVE 06/03/2020 1420   KETONESUR NEGATIVE 06/03/2020 1420   PROTEINUR NEGATIVE 06/03/2020 1420   UROBILINOGEN 0.2 10/09/2014 2112   NITRITE NEGATIVE 06/03/2020 1420   LEUKOCYTESUR NEGATIVE 06/03/2020 1420   Sepsis Labs Invalid input(s): PROCALCITONIN,  WBC,  LACTICIDVEN Microbiology Recent Results (from the past 240 hour(s))  Blood Culture (routine x 2)     Status: None (Preliminary result)   Collection Time: 06/03/20 12:40 PM   Specimen: BLOOD LEFT FOREARM  Result Value Ref Range Status   Specimen Description BLOOD LEFT FOREARM  Final   Special Requests   Final  BOTTLES DRAWN AEROBIC AND ANAEROBIC Blood Culture results may not be optimal due to an excessive volume of blood received in culture bottles   Culture   Final    NO GROWTH 4 DAYS Performed at [place] [address] [place], [place] [address]    Report Status PENDING  Incomplete  Blood Culture (routine  x 2)     Status: Abnormal   Collection Time: 06/03/20 12:53 PM   Specimen: BLOOD RIGHT FOREARM  Result Value Ref Range Status   Specimen Description BLOOD RIGHT FOREARM  Final   Special Requests   Final    BOTTLES DRAWN AEROBIC AND ANAEROBIC Blood Culture adequate volume   Culture  Setup Time   Final    GRAM POSITIVE COCCI IN CLUSTERS AEROBIC BOTTLE ONLY Organism ID to follow CRITICAL RESULT CALLED TO, READ BACK BY AND VERIFIED WITH: PHARMD LAURA SEAY BY MESSAN H. AT 0244 ON 06/04/2020    Culture (A)  Final    STAPHYLOCOCCUS CAPITIS THE SIGNIFICANCE OF ISOLATING THIS ORGANISM FROM A SINGLE SET OF BLOOD CULTURES WHEN MULTIPLE SETS ARE DRAWN IS UNCERTAIN. PLEASE NOTIFY THE MICROBIOLOGY DEPARTMENT WITHIN ONE WEEK IF SPECIATION AND SENSITIVITIES ARE REQUIRED. Performed at [facility], [place] [address] [place], [place] [address]    Report Status 06/05/2020 FINAL  Final  Blood Culture ID Panel (Reflexed)     Status: Abnormal   Collection Time: 06/03/20 12:53 PM  Result Value Ref Range Status   Enterococcus faecalis NOT DETECTED NOT DETECTED Final   Enterococcus Faecium NOT DETECTED NOT DETECTED Final   Listeria monocytogenes NOT DETECTED NOT DETECTED Final   Staphylococcus species DETECTED (A) NOT DETECTED Final    Comment: CRITICAL RESULT CALLED TO, READ BACK BY AND VERIFIED WITH: PHARMD LAURA SEAY BY MESSAN H. AT 0244 ON 06/04/2020    Staphylococcus aureus (BCID) NOT DETECTED NOT DETECTED Final   Staphylococcus epidermidis NOT DETECTED NOT DETECTED Final   Staphylococcus lugdunensis NOT DETECTED NOT DETECTED Final   Streptococcus species NOT DETECTED NOT DETECTED Final   Streptococcus agalactiae NOT DETECTED NOT DETECTED Final   Streptococcus pneumoniae NOT DETECTED NOT DETECTED Final   Streptococcus pyogenes NOT DETECTED NOT DETECTED Final   A.calcoaceticus-baumannii NOT DETECTED NOT DETECTED Final   Bacteroides fragilis NOT DETECTED NOT DETECTED Final   Enterobacterales NOT  DETECTED NOT DETECTED Final   Enterobacter cloacae complex NOT DETECTED NOT DETECTED Final   Escherichia coli NOT DETECTED NOT DETECTED Final   Klebsiella aerogenes NOT DETECTED NOT DETECTED Final   Klebsiella oxytoca NOT DETECTED NOT DETECTED Final   Klebsiella pneumoniae NOT DETECTED NOT DETECTED Final   Proteus species NOT DETECTED NOT DETECTED Final   Salmonella species NOT DETECTED NOT DETECTED Final   Serratia marcescens NOT DETECTED NOT DETECTED Final   Haemophilus influenzae NOT DETECTED NOT DETECTED Final   Neisseria meningitidis NOT DETECTED NOT DETECTED Final   Pseudomonas aeruginosa NOT DETECTED NOT DETECTED Final   Stenotrophomonas maltophilia NOT DETECTED NOT DETECTED Final   Candida albicans NOT DETECTED NOT DETECTED Final   Candida auris NOT DETECTED NOT DETECTED Final   Candida glabrata NOT DETECTED NOT DETECTED Final   Candida krusei NOT DETECTED NOT DETECTED Final   Candida parapsilosis NOT DETECTED NOT DETECTED Final   Candida tropicalis NOT DETECTED NOT DETECTED Final   Cryptococcus neoformans/gattii NOT DETECTED NOT DETECTED Final    Comment: Performed at [facility], [address] [place], [place] [address]  Urine culture  Status: Abnormal   Collection Time: 06/03/20  2:20 PM   Specimen: In/Out Cath Urine  Result Value Ref Range Status   Specimen Description IN/OUT CATH URINE  Final   Special Requests NONE  Final   Culture (A)  Final    <10,000 COLONIES/mL INSIGNIFICANT GROWTH Performed at [facility], [place] [address] [place], [place] [address]    Report Status 06/05/2020 FINAL  Final  Resp Panel by RT-PCR (Flu A&B, Covid) Nasopharyngeal Swab     Status: None   Collection Time: 06/03/20  2:27 PM   Specimen: Nasopharyngeal Swab; Nasopharyngeal(NP) swabs in vial transport medium  Result Value Ref Range Status   SARS Coronavirus 2 by RT PCR NEGATIVE NEGATIVE Final    Comment: (NOTE) SARS-CoV-2 target nucleic acids are NOT DETECTED.  The  SARS-CoV-2 RNA is generally detectable in upper respiratory specimens during the acute phase of infection. The lowest concentration of SARS-CoV-2 viral copies this assay can detect is 138 copies/mL. A negative result does not preclude SARS-Cov-2 infection and should not be used as the sole basis for treatment or other patient management decisions. A negative result may occur with  improper specimen collection/handling, submission of specimen other than nasopharyngeal swab, presence of viral mutation(s) within the areas targeted by this assay, and inadequate number of viral copies(<138 copies/mL). A negative result must be combined with clinical observations, patient history, and epidemiological information. The expected result is Negative.  Fact Sheet for Patients:  EntrepreneurPulse.com.au  Fact Sheet for Healthcare Providers:  IncredibleEmployment.be  This test is no t yet approved or cleared by the [place] FDA and  has been authorized for detection and/or diagnosis of SARS-CoV-2 by FDA under an Emergency Use Authorization (EUA). This EUA will remain  in effect (meaning this test can be used) for the duration of the COVID-19 declaration under Section 564(b)(1) of the Act, 21 U.S.C.section 360bbb-3(b)(1), unless the authorization is terminated  or revoked sooner.       Influenza A by PCR NEGATIVE NEGATIVE Final   Influenza B by PCR NEGATIVE NEGATIVE Final    Comment: (NOTE) The Xpert Xpress SARS-CoV-2/FLU/RSV plus assay is intended as an aid in the diagnosis of influenza from Nasopharyngeal swab specimens and should not be used as a sole basis for treatment. Nasal washings and aspirates are unacceptable for Xpert Xpress SARS-CoV-2/FLU/RSV testing.  Fact Sheet for Patients: EntrepreneurPulse.com.au  Fact Sheet for Healthcare Providers: IncredibleEmployment.be  This test is not yet approved or  cleared by the [place] FDA and has been authorized for detection and/or diagnosis of SARS-CoV-2 by FDA under an Emergency Use Authorization (EUA). This EUA will remain in effect (meaning this test can be used) for the duration of the COVID-19 declaration under Section 564(b)(1) of the Act, 21 U.S.C. section 360bbb-3(b)(1), unless the authorization is terminated or revoked.  Performed at [facility], [place] [address], [place], [place] [address]   Respiratory (~20 pathogens) panel by PCR     Status: Abnormal   Collection Time: 06/04/20  8:09 AM   Specimen: Nasopharyngeal Swab; Respiratory  Result Value Ref Range Status   Adenovirus NOT DETECTED NOT DETECTED Final   Coronavirus 229E NOT DETECTED NOT DETECTED Final    Comment: (NOTE) The Coronavirus on the Respiratory Panel, DOES NOT test for the novel  Coronavirus (2019 nCoV)    Coronavirus HKU1 NOT DETECTED NOT DETECTED Final   Coronavirus NL63 NOT DETECTED NOT DETECTED Final   Coronavirus OC43 NOT DETECTED NOT DETECTED Final  Metapneumovirus DETECTED (A) NOT DETECTED Final   Rhinovirus / Enterovirus NOT DETECTED NOT DETECTED Final   Influenza A NOT DETECTED NOT DETECTED Final   Influenza B NOT DETECTED NOT DETECTED Final   Parainfluenza Virus 1 NOT DETECTED NOT DETECTED Final   Parainfluenza Virus 2 NOT DETECTED NOT DETECTED Final   Parainfluenza Virus 3 NOT DETECTED NOT DETECTED Final   Parainfluenza Virus 4 NOT DETECTED NOT DETECTED Final   Respiratory Syncytial Virus NOT DETECTED NOT DETECTED Final   Bordetella pertussis NOT DETECTED NOT DETECTED Final   Bordetella Parapertussis NOT DETECTED NOT DETECTED Final   Chlamydophila pneumoniae NOT DETECTED NOT DETECTED Final   Mycoplasma pneumoniae NOT DETECTED NOT DETECTED Final    Comment: Performed at [facility], [place] [address] [place], [place] [address]  SARS CORONAVIRUS 2 (TAT 6-24 HRS) Nasopharyngeal Nasopharyngeal Swab     Status: None   Collection Time:  06/06/20  3:26 PM   Specimen: Nasopharyngeal Swab  Result Value Ref Range Status   SARS Coronavirus 2 NEGATIVE NEGATIVE Final    Comment: (NOTE) SARS-CoV-2 target nucleic acids are NOT DETECTED.  The SARS-CoV-2 RNA is generally detectable in upper and lower respiratory specimens during the acute phase of infection. Negative results do not preclude SARS-CoV-2 infection, do not rule out co-infections with other pathogens, and should not be used as the sole basis for treatment or other patient management decisions. Negative results must be combined with clinical observations, patient history, and epidemiological information. The expected result is Negative.  Fact Sheet for Patients: SugarRoll.be  Fact Sheet for Healthcare Providers: https://www.woods-mathews.com/  This test is not yet approved or cleared by the [place] FDA and  has been authorized for detection and/or diagnosis of SARS-CoV-2 by FDA under an Emergency Use Authorization (EUA). This EUA will remain  in effect (meaning this test can be used) for the duration of the COVID-19 declaration under Se ction 564(b)(1) of the Act, 21 U.S.C. section 360bbb-3(b)(1), unless the authorization is terminated or revoked sooner.  Performed at [facility], [place] [address], [place], [place] [address]     Procedures/Studies: CT Head Wo Contrast  Result Date: 06/03/2020 CLINICAL DATA:  Mental status change, unknown cause. Increased altered mental status for 3 days. EXAM: CT HEAD WITHOUT CONTRAST TECHNIQUE: Contiguous axial images were obtained from the base of the skull through the vertex without intravenous contrast. COMPARISON:  Prior brain MRI 10/27/2019. FINDINGS: Brain: Mild generalized parenchymal atrophy. Associated prominence of the ventricles and sulci. Redemonstrated chronic cortically based infarcts within the right parietal lobe and bilateral occipital lobes. Redemonstrated  additional chronic infarcts within the right basal ganglia, bilateral thalami, within the pons and within the left greater than right cerebellar hemispheres. Stable background moderate chronic small vessel ischemic disease within the cerebral white matter and pons. There is no acute intracranial hemorrhage. No acute demarcated cortical infarct is identified No extra-axial fluid collection. No evidence of intracranial mass. No midline shift. Vascular: No hyperdense vessel.  Atherosclerotic calcifications. Skull: Normal. Negative for fracture or focal suspicious osseous lesion. Sinuses/Orbits: Visualized orbits show no acute finding. Paranasal sinus disease. Most notably, there is mild-to-moderate mucosal thickening within the bilateral ethmoid air cells and right sphenoid sinus and mild mucosal thickening within the bilateral maxillary sinuses. Additionally, there are small volume frothy secretions within the right sphenoid and maxillary sinuses. IMPRESSION: No evidence of acute intracranial abnormality. Generalized parenchymal atrophy with extensive chronic ischemia and multiple old infarcts, unchanged from the brain MRI of 10/27/2019.  Paranasal sinus disease as described. Correlate for acute on chronic sinusitis. Electronically Signed   By: Kellie Simmering DO   On: 06/03/2020 14:17   DG Chest Port 1 View  Result Date: 06/03/2020 CLINICAL DATA:  Sepsis. EXAM: PORTABLE CHEST 1 VIEW COMPARISON:  October 20, 2019. FINDINGS: The heart size and mediastinal contours are within normal limits. Right lung is clear. Left infrahilar opacity is noted concerning for pneumonia or atelectasis. The visualized skeletal structures are unremarkable. IMPRESSION: Mild left infrahilar opacity is noted concerning for pneumonia or atelectasis. Electronically Signed   By: Marijo Conception M.D.   On: 06/03/2020 13:04     Time coordinating discharge: Over 30 minutes    Dwyane Dee, MD  [facility] 06/07/2020, 11:47 AM

## 2020-06-07 NOTE — Care Management Important Message (Signed)
Important Message  Patient Details  Name: Harry [name] MRN: [id number] Date of Birth: [birth year]   Medicare Important Message Given:  Yes - Important Message mailed due to current National Emergency  Verbal consent obtained due to current National Emergency  Relationship to patient: Self Contact Name: Ngawang Schoof Call Date: 06/07/20  Time: 1328 Phone: [phone] Outcome: Spoke with contact Important Message mailed to: Patient address on file    Delorse Lek 06/07/2020, 1:29 PM

## 2020-06-07 NOTE — Progress Notes (Signed)
Patient blood sugar down 66 this morning one cup of orange juice given and rechecked blood sugar up 85.

## 2020-06-07 NOTE — Progress Notes (Signed)
Occupational Therapy Treatment Patient Details Name: Harry [name] MRN: [id number] DOB: 04/13/1945 Today's Date: 06/07/2020    History of present illness The pt is a 75 yo male presenting from home with AMS on 4/1. Upon workup, pt found to be febrile, chest x-ray and concerning for pneumonia. PMH includes: HTN, HLD, DM II, CVA, PVD, CKD III, and dementia.   OT comments  Pt. Was very lethargic at the beginning of session. Pt. eventualy did wake up and did agree to sit up in chair. Pt.  Was able to preform grooming and ue dressing sitting eob. Pt. Then refused to sit up in chair. Pt. Was encouraged to sit up in chair to eat lunch but again refused. Pt. Was setup for lunch in bed. Bed alarm reset.   Follow Up Recommendations  SNF    Equipment Recommendations  None recommended by OT    Recommendations for Other Services      Precautions / Restrictions         Mobility Bed Mobility         Supine to sit: Min assist          Transfers                      Balance                                           ADL either performed or assessed with clinical judgement   ADL       Grooming: Wash/dry hands;Wash/dry face;Supervision/safety;Set up   Upper Body Bathing: Minimal assistance       Upper Body Dressing : Minimal assistance                     General ADL Comments: Pt. was lethargic during session.     Vision       Perception     Praxis      Cognition Arousal/Alertness: Lethargic Behavior During Therapy: Flat affect Overall Cognitive Status: No family/caregiver present to determine baseline cognitive functioning                                 General Comments: Pt. was dificult to arouse and kept falling back to sleep during session.        Exercises     Shoulder Instructions       General Comments      Pertinent Vitals/ Pain       Pain Assessment: No/denies pain  Home Living                                           Prior Functioning/Environment              Frequency  Min 2X/week        Progress Toward Goals  OT Goals(current goals can now be found in the care plan section)  Progress towards OT goals: Progressing toward goals  Acute Rehab OT Goals Patient Stated Goal: pt. is agreeable to snf OT Goal Formulation: With patient Time For Goal Achievement: 06/14/20 Potential to Achieve Goals: Good ADL Goals Pt Will Perform Grooming: with set-up Pt Will Perform Upper Body Bathing: with set-up Pt  Will Perform Upper Body Dressing: with set-up Pt Will Transfer to Toilet: with min guard assist Pt Will Perform Toileting - Clothing Manipulation and hygiene: with min guard assist  Plan Discharge plan remains appropriate    Co-evaluation                 AM-PAC OT "6 Clicks" Daily Activity     Outcome Measure   Help from another person eating meals?: A Little Help from another person taking care of personal grooming?: A Little Help from another person toileting, which includes using toliet, bedpan, or urinal?: A Lot Help from another person bathing (including washing, rinsing, drying)?: A Lot Help from another person to put on and taking off regular upper body clothing?: A Little Help from another person to put on and taking off regular lower body clothing?: A Lot 6 Click Score: 15    End of Session    OT Visit Diagnosis: Unsteadiness on feet (R26.81)   Activity Tolerance Patient limited by lethargy   Patient Left in bed;with bed alarm set   Nurse Communication  (ok therapy)        [id number] OT Time Calculation (min): 24 min  Charges: OT General Charges $OT Visit: 1 Visit OT Treatments $Self Care/Home Management : 23-37 mins  Reece Packer OT/L    Holli Rengel 06/07/2020, 1:15 PM

## 2020-06-07 NOTE — Progress Notes (Addendum)
DISCHARGE NOTE SNF Alinda Money to be discharged to [facility] per MD order. Patient verbalized understanding.    Tried to give report to the nurse but phone keep on ringing and call fell back  to [facility] took the report and telling me that ,she is going to report to their nurse.Sharyn Lull got [phone] number to call back in case ,if the receiving nurse have question to me.  Skin clean, dry and intact without evidence of skin break down, no evidence of skin tears noted. IV catheter discontinued intact. Site without signs and symptoms of complications. Dressing and pressure applied. Pt denies pain at the site currently. No complaints noted.  Patient free of lines, drains, and wounds.   Discharge packet assembled. An After Visit Summary (AVS) was printed and given to the EMS personnel. Patient escorted via stretcher and discharged to [facility] via ambulance. Report called to accepting facility; all questions and concerns addressed.   [place], Zenon Mayo, RN

## 2020-06-08 LAB — CULTURE, BLOOD (ROUTINE X 2): Culture: NO GROWTH

## 2020-06-09 DIAGNOSIS — M6281 Muscle weakness (generalized): Secondary | ICD-10-CM | POA: Diagnosis not present

## 2020-06-09 DIAGNOSIS — J211 Acute bronchiolitis due to human metapneumovirus: Secondary | ICD-10-CM | POA: Diagnosis not present

## 2020-06-09 DIAGNOSIS — G9341 Metabolic encephalopathy: Secondary | ICD-10-CM | POA: Diagnosis not present

## 2020-06-09 DIAGNOSIS — N178 Other acute kidney failure: Secondary | ICD-10-CM | POA: Diagnosis not present

## 2020-06-09 DIAGNOSIS — E119 Type 2 diabetes mellitus without complications: Secondary | ICD-10-CM | POA: Diagnosis not present

## 2020-06-09 DIAGNOSIS — I739 Peripheral vascular disease, unspecified: Secondary | ICD-10-CM | POA: Diagnosis not present

## 2020-06-09 DIAGNOSIS — A4189 Other specified sepsis: Secondary | ICD-10-CM | POA: Diagnosis not present

## 2020-06-09 DIAGNOSIS — N183 Chronic kidney disease, stage 3 unspecified: Secondary | ICD-10-CM | POA: Diagnosis not present

## 2020-06-09 DIAGNOSIS — J209 Acute bronchitis, unspecified: Secondary | ICD-10-CM | POA: Diagnosis not present

## 2020-06-09 DIAGNOSIS — Z20828 Contact with and (suspected) exposure to other viral communicable diseases: Secondary | ICD-10-CM | POA: Diagnosis not present

## 2020-06-09 DIAGNOSIS — I152 Hypertension secondary to endocrine disorders: Secondary | ICD-10-CM | POA: Diagnosis not present

## 2020-06-09 DIAGNOSIS — F0280 Dementia in other diseases classified elsewhere without behavioral disturbance: Secondary | ICD-10-CM | POA: Diagnosis not present

## 2020-06-09 DIAGNOSIS — R6520 Severe sepsis without septic shock: Secondary | ICD-10-CM | POA: Diagnosis not present

## 2020-06-09 DIAGNOSIS — E782 Mixed hyperlipidemia: Secondary | ICD-10-CM | POA: Diagnosis not present

## 2020-06-09 DIAGNOSIS — J159 Unspecified bacterial pneumonia: Secondary | ICD-10-CM | POA: Diagnosis not present

## 2020-06-09 DIAGNOSIS — R2681 Unsteadiness on feet: Secondary | ICD-10-CM | POA: Diagnosis not present

## 2020-06-09 DIAGNOSIS — F17200 Nicotine dependence, unspecified, uncomplicated: Secondary | ICD-10-CM | POA: Diagnosis not present

## 2020-06-09 DIAGNOSIS — N189 Chronic kidney disease, unspecified: Secondary | ICD-10-CM | POA: Diagnosis not present

## 2020-06-09 DIAGNOSIS — I10 Essential (primary) hypertension: Secondary | ICD-10-CM | POA: Diagnosis not present

## 2020-06-09 DIAGNOSIS — J189 Pneumonia, unspecified organism: Secondary | ICD-10-CM | POA: Diagnosis not present

## 2020-06-09 DIAGNOSIS — D649 Anemia, unspecified: Secondary | ICD-10-CM | POA: Diagnosis not present

## 2020-06-09 DIAGNOSIS — E1159 Type 2 diabetes mellitus with other circulatory complications: Secondary | ICD-10-CM | POA: Diagnosis not present

## 2020-06-09 DIAGNOSIS — F0390 Unspecified dementia without behavioral disturbance: Secondary | ICD-10-CM | POA: Diagnosis not present

## 2020-06-13 DIAGNOSIS — J159 Unspecified bacterial pneumonia: Secondary | ICD-10-CM | POA: Diagnosis not present

## 2020-06-13 DIAGNOSIS — D649 Anemia, unspecified: Secondary | ICD-10-CM | POA: Diagnosis not present

## 2020-06-13 DIAGNOSIS — J211 Acute bronchiolitis due to human metapneumovirus: Secondary | ICD-10-CM | POA: Diagnosis not present

## 2020-06-13 DIAGNOSIS — G9341 Metabolic encephalopathy: Secondary | ICD-10-CM | POA: Diagnosis not present

## 2020-06-13 DIAGNOSIS — I739 Peripheral vascular disease, unspecified: Secondary | ICD-10-CM | POA: Diagnosis not present

## 2020-06-13 DIAGNOSIS — E119 Type 2 diabetes mellitus without complications: Secondary | ICD-10-CM | POA: Diagnosis not present

## 2020-06-13 DIAGNOSIS — F0390 Unspecified dementia without behavioral disturbance: Secondary | ICD-10-CM | POA: Diagnosis not present

## 2020-06-13 DIAGNOSIS — N189 Chronic kidney disease, unspecified: Secondary | ICD-10-CM | POA: Diagnosis not present

## 2020-06-13 DIAGNOSIS — M6281 Muscle weakness (generalized): Secondary | ICD-10-CM | POA: Diagnosis not present

## 2020-06-13 DIAGNOSIS — E782 Mixed hyperlipidemia: Secondary | ICD-10-CM | POA: Diagnosis not present

## 2020-06-13 DIAGNOSIS — I10 Essential (primary) hypertension: Secondary | ICD-10-CM | POA: Diagnosis not present

## 2020-06-13 DIAGNOSIS — R2681 Unsteadiness on feet: Secondary | ICD-10-CM | POA: Diagnosis not present

## 2020-06-14 ENCOUNTER — Other Ambulatory Visit: Payer: Self-pay | Admitting: *Deleted

## 2020-06-14 DIAGNOSIS — F17200 Nicotine dependence, unspecified, uncomplicated: Secondary | ICD-10-CM | POA: Diagnosis not present

## 2020-06-14 DIAGNOSIS — G903 Multi-system degeneration of the autonomic nervous system: Secondary | ICD-10-CM | POA: Diagnosis not present

## 2020-06-14 DIAGNOSIS — F0390 Unspecified dementia without behavioral disturbance: Secondary | ICD-10-CM | POA: Diagnosis not present

## 2020-06-14 DIAGNOSIS — I152 Hypertension secondary to endocrine disorders: Secondary | ICD-10-CM | POA: Diagnosis not present

## 2020-06-14 NOTE — Patient Outreach (Signed)
Member screened for potential [facility] Care Management needs. Harry [name] is receiving skilled therapy at [facility].  Update received from [facility] indicating transition plan is to return home with daughter and son in law.  Harry [name] has history of syncope. SW reports member had syncopal episode today.   Will continue to follow while member resides in SNF.   Will plan outreach to daughter/DPR [place] as appropriate.    Marthenia Rolling, MSN, RN,BSN [place] Acute Care Coordinator [phone] [facility]) [phone]  (Toll free office)

## 2020-06-16 DIAGNOSIS — E782 Mixed hyperlipidemia: Secondary | ICD-10-CM | POA: Diagnosis not present

## 2020-06-16 DIAGNOSIS — F0390 Unspecified dementia without behavioral disturbance: Secondary | ICD-10-CM | POA: Diagnosis not present

## 2020-06-16 DIAGNOSIS — J211 Acute bronchiolitis due to human metapneumovirus: Secondary | ICD-10-CM | POA: Diagnosis not present

## 2020-06-16 DIAGNOSIS — Z0189 Encounter for other specified special examinations: Secondary | ICD-10-CM | POA: Diagnosis not present

## 2020-06-16 DIAGNOSIS — R42 Dizziness and giddiness: Secondary | ICD-10-CM | POA: Diagnosis not present

## 2020-06-16 DIAGNOSIS — N183 Chronic kidney disease, stage 3 unspecified: Secondary | ICD-10-CM | POA: Diagnosis not present

## 2020-06-16 DIAGNOSIS — N189 Chronic kidney disease, unspecified: Secondary | ICD-10-CM | POA: Diagnosis not present

## 2020-06-16 DIAGNOSIS — I10 Essential (primary) hypertension: Secondary | ICD-10-CM | POA: Diagnosis not present

## 2020-06-16 DIAGNOSIS — I152 Hypertension secondary to endocrine disorders: Secondary | ICD-10-CM | POA: Diagnosis not present

## 2020-06-16 DIAGNOSIS — D649 Anemia, unspecified: Secondary | ICD-10-CM | POA: Diagnosis not present

## 2020-06-16 DIAGNOSIS — R2681 Unsteadiness on feet: Secondary | ICD-10-CM | POA: Diagnosis not present

## 2020-06-16 DIAGNOSIS — F0280 Dementia in other diseases classified elsewhere without behavioral disturbance: Secondary | ICD-10-CM | POA: Diagnosis not present

## 2020-06-16 DIAGNOSIS — G9341 Metabolic encephalopathy: Secondary | ICD-10-CM | POA: Diagnosis not present

## 2020-06-16 DIAGNOSIS — N178 Other acute kidney failure: Secondary | ICD-10-CM | POA: Diagnosis not present

## 2020-06-16 DIAGNOSIS — R55 Syncope and collapse: Secondary | ICD-10-CM | POA: Diagnosis not present

## 2020-06-16 DIAGNOSIS — J159 Unspecified bacterial pneumonia: Secondary | ICD-10-CM | POA: Diagnosis not present

## 2020-06-16 DIAGNOSIS — M6281 Muscle weakness (generalized): Secondary | ICD-10-CM | POA: Diagnosis not present

## 2020-06-16 DIAGNOSIS — I739 Peripheral vascular disease, unspecified: Secondary | ICD-10-CM | POA: Diagnosis not present

## 2020-06-16 DIAGNOSIS — E119 Type 2 diabetes mellitus without complications: Secondary | ICD-10-CM | POA: Diagnosis not present

## 2020-06-20 DIAGNOSIS — J159 Unspecified bacterial pneumonia: Secondary | ICD-10-CM | POA: Diagnosis not present

## 2020-06-20 DIAGNOSIS — N189 Chronic kidney disease, unspecified: Secondary | ICD-10-CM | POA: Diagnosis not present

## 2020-06-20 DIAGNOSIS — G9341 Metabolic encephalopathy: Secondary | ICD-10-CM | POA: Diagnosis not present

## 2020-06-20 DIAGNOSIS — F0390 Unspecified dementia without behavioral disturbance: Secondary | ICD-10-CM | POA: Diagnosis not present

## 2020-06-20 DIAGNOSIS — F330 Major depressive disorder, recurrent, mild: Secondary | ICD-10-CM | POA: Diagnosis not present

## 2020-06-20 DIAGNOSIS — E119 Type 2 diabetes mellitus without complications: Secondary | ICD-10-CM | POA: Diagnosis not present

## 2020-06-20 DIAGNOSIS — H04123 Dry eye syndrome of bilateral lacrimal glands: Secondary | ICD-10-CM | POA: Diagnosis not present

## 2020-06-20 DIAGNOSIS — D649 Anemia, unspecified: Secondary | ICD-10-CM | POA: Diagnosis not present

## 2020-06-20 DIAGNOSIS — I739 Peripheral vascular disease, unspecified: Secondary | ICD-10-CM | POA: Diagnosis not present

## 2020-06-20 DIAGNOSIS — R2681 Unsteadiness on feet: Secondary | ICD-10-CM | POA: Diagnosis not present

## 2020-06-20 DIAGNOSIS — J211 Acute bronchiolitis due to human metapneumovirus: Secondary | ICD-10-CM | POA: Diagnosis not present

## 2020-06-20 DIAGNOSIS — E782 Mixed hyperlipidemia: Secondary | ICD-10-CM | POA: Diagnosis not present

## 2020-06-20 DIAGNOSIS — I10 Essential (primary) hypertension: Secondary | ICD-10-CM | POA: Diagnosis not present

## 2020-06-20 DIAGNOSIS — H269 Unspecified cataract: Secondary | ICD-10-CM | POA: Diagnosis not present

## 2020-06-20 DIAGNOSIS — M6281 Muscle weakness (generalized): Secondary | ICD-10-CM | POA: Diagnosis not present

## 2020-06-23 DIAGNOSIS — R2681 Unsteadiness on feet: Secondary | ICD-10-CM | POA: Diagnosis not present

## 2020-06-23 DIAGNOSIS — D649 Anemia, unspecified: Secondary | ICD-10-CM | POA: Diagnosis not present

## 2020-06-23 DIAGNOSIS — I951 Orthostatic hypotension: Secondary | ICD-10-CM | POA: Diagnosis not present

## 2020-06-23 DIAGNOSIS — G9341 Metabolic encephalopathy: Secondary | ICD-10-CM | POA: Diagnosis not present

## 2020-06-23 DIAGNOSIS — G903 Multi-system degeneration of the autonomic nervous system: Secondary | ICD-10-CM | POA: Diagnosis not present

## 2020-06-23 DIAGNOSIS — F0390 Unspecified dementia without behavioral disturbance: Secondary | ICD-10-CM | POA: Diagnosis not present

## 2020-06-23 DIAGNOSIS — J159 Unspecified bacterial pneumonia: Secondary | ICD-10-CM | POA: Diagnosis not present

## 2020-06-23 DIAGNOSIS — E119 Type 2 diabetes mellitus without complications: Secondary | ICD-10-CM | POA: Diagnosis not present

## 2020-06-23 DIAGNOSIS — J211 Acute bronchiolitis due to human metapneumovirus: Secondary | ICD-10-CM | POA: Diagnosis not present

## 2020-06-23 DIAGNOSIS — E782 Mixed hyperlipidemia: Secondary | ICD-10-CM | POA: Diagnosis not present

## 2020-06-23 DIAGNOSIS — I739 Peripheral vascular disease, unspecified: Secondary | ICD-10-CM | POA: Diagnosis not present

## 2020-06-23 DIAGNOSIS — I10 Essential (primary) hypertension: Secondary | ICD-10-CM | POA: Diagnosis not present

## 2020-06-23 DIAGNOSIS — M6281 Muscle weakness (generalized): Secondary | ICD-10-CM | POA: Diagnosis not present

## 2020-06-23 DIAGNOSIS — N189 Chronic kidney disease, unspecified: Secondary | ICD-10-CM | POA: Diagnosis not present

## 2020-06-27 DIAGNOSIS — E119 Type 2 diabetes mellitus without complications: Secondary | ICD-10-CM | POA: Diagnosis not present

## 2020-06-27 DIAGNOSIS — E782 Mixed hyperlipidemia: Secondary | ICD-10-CM | POA: Diagnosis not present

## 2020-06-27 DIAGNOSIS — J209 Acute bronchitis, unspecified: Secondary | ICD-10-CM | POA: Diagnosis not present

## 2020-06-27 DIAGNOSIS — R2681 Unsteadiness on feet: Secondary | ICD-10-CM | POA: Diagnosis not present

## 2020-06-27 DIAGNOSIS — I10 Essential (primary) hypertension: Secondary | ICD-10-CM | POA: Diagnosis not present

## 2020-06-27 DIAGNOSIS — J211 Acute bronchiolitis due to human metapneumovirus: Secondary | ICD-10-CM | POA: Diagnosis not present

## 2020-06-27 DIAGNOSIS — R058 Other specified cough: Secondary | ICD-10-CM | POA: Diagnosis not present

## 2020-06-27 DIAGNOSIS — M6281 Muscle weakness (generalized): Secondary | ICD-10-CM | POA: Diagnosis not present

## 2020-06-27 DIAGNOSIS — I739 Peripheral vascular disease, unspecified: Secondary | ICD-10-CM | POA: Diagnosis not present

## 2020-06-27 DIAGNOSIS — G9341 Metabolic encephalopathy: Secondary | ICD-10-CM | POA: Diagnosis not present

## 2020-06-27 DIAGNOSIS — R42 Dizziness and giddiness: Secondary | ICD-10-CM | POA: Diagnosis not present

## 2020-06-27 DIAGNOSIS — F0390 Unspecified dementia without behavioral disturbance: Secondary | ICD-10-CM | POA: Diagnosis not present

## 2020-06-27 DIAGNOSIS — N189 Chronic kidney disease, unspecified: Secondary | ICD-10-CM | POA: Diagnosis not present

## 2020-06-27 DIAGNOSIS — I951 Orthostatic hypotension: Secondary | ICD-10-CM | POA: Diagnosis not present

## 2020-06-27 DIAGNOSIS — J159 Unspecified bacterial pneumonia: Secondary | ICD-10-CM | POA: Diagnosis not present

## 2020-06-27 DIAGNOSIS — D649 Anemia, unspecified: Secondary | ICD-10-CM | POA: Diagnosis not present

## 2020-06-28 ENCOUNTER — Other Ambulatory Visit: Payer: Self-pay | Admitting: *Deleted

## 2020-06-28 DIAGNOSIS — H04123 Dry eye syndrome of bilateral lacrimal glands: Secondary | ICD-10-CM | POA: Diagnosis not present

## 2020-06-28 DIAGNOSIS — R55 Syncope and collapse: Secondary | ICD-10-CM | POA: Diagnosis not present

## 2020-06-28 DIAGNOSIS — I951 Orthostatic hypotension: Secondary | ICD-10-CM | POA: Diagnosis not present

## 2020-06-28 DIAGNOSIS — G903 Multi-system degeneration of the autonomic nervous system: Secondary | ICD-10-CM | POA: Diagnosis not present

## 2020-06-28 NOTE — Patient Outreach (Signed)
[place] Coordinator follow up. Member screened for potential [facility] Care Management needs.  Mr. Forstner resides in [facility]. Communication sent to facility SW to inquire about transition plans.   Will continue to follow and plan outreach as appropriate.     Marthenia Rolling, MSN, RN,BSN [place] Acute Care Coordinator [phone] [facility]) [phone]  (Toll free office)

## 2020-06-30 ENCOUNTER — Other Ambulatory Visit: Payer: Self-pay | Admitting: *Deleted

## 2020-06-30 DIAGNOSIS — I10 Essential (primary) hypertension: Secondary | ICD-10-CM | POA: Diagnosis not present

## 2020-06-30 DIAGNOSIS — G9341 Metabolic encephalopathy: Secondary | ICD-10-CM | POA: Diagnosis not present

## 2020-06-30 DIAGNOSIS — I739 Peripheral vascular disease, unspecified: Secondary | ICD-10-CM | POA: Diagnosis not present

## 2020-06-30 DIAGNOSIS — J159 Unspecified bacterial pneumonia: Secondary | ICD-10-CM | POA: Diagnosis not present

## 2020-06-30 DIAGNOSIS — D649 Anemia, unspecified: Secondary | ICD-10-CM | POA: Diagnosis not present

## 2020-06-30 DIAGNOSIS — R2681 Unsteadiness on feet: Secondary | ICD-10-CM | POA: Diagnosis not present

## 2020-06-30 DIAGNOSIS — E119 Type 2 diabetes mellitus without complications: Secondary | ICD-10-CM | POA: Diagnosis not present

## 2020-06-30 DIAGNOSIS — F0390 Unspecified dementia without behavioral disturbance: Secondary | ICD-10-CM | POA: Diagnosis not present

## 2020-06-30 DIAGNOSIS — M6281 Muscle weakness (generalized): Secondary | ICD-10-CM | POA: Diagnosis not present

## 2020-06-30 DIAGNOSIS — E782 Mixed hyperlipidemia: Secondary | ICD-10-CM | POA: Diagnosis not present

## 2020-06-30 DIAGNOSIS — N189 Chronic kidney disease, unspecified: Secondary | ICD-10-CM | POA: Diagnosis not present

## 2020-06-30 DIAGNOSIS — J211 Acute bronchiolitis due to human metapneumovirus: Secondary | ICD-10-CM | POA: Diagnosis not present

## 2020-06-30 NOTE — Patient Outreach (Signed)
[facility] Post- Acute Care Coordinator follow up. Member screened for potential [facility] Care Management needs.  Harry [name] resides in [facility]. Update received from [facility] indicating Harry [name] may possibly stay long term care.   Will continue to follow for transition plans and potential [facility] Care Management services while Harry [name] resides in SNF.    Marthenia Rolling, MSN, RN,BSN [place] Acute Care Coordinator [phone] [facility]) [phone]  (Toll free office)

## 2020-07-05 ENCOUNTER — Other Ambulatory Visit: Payer: Self-pay | Admitting: *Deleted

## 2020-07-05 DIAGNOSIS — E1159 Type 2 diabetes mellitus with other circulatory complications: Secondary | ICD-10-CM | POA: Diagnosis not present

## 2020-07-05 DIAGNOSIS — I152 Hypertension secondary to endocrine disorders: Secondary | ICD-10-CM | POA: Diagnosis not present

## 2020-07-05 DIAGNOSIS — F0390 Unspecified dementia without behavioral disturbance: Secondary | ICD-10-CM | POA: Diagnosis not present

## 2020-07-05 DIAGNOSIS — D638 Anemia in other chronic diseases classified elsewhere: Secondary | ICD-10-CM | POA: Diagnosis not present

## 2020-07-05 NOTE — Patient Outreach (Signed)
[facility] Post- Acute Care Coordinator follow up. Mr. Brandel resides in [facility]. Member screened for potential [facility] Care Management needs.  Telephone call attempt made to daughter/ DPR [name] [phone] to discuss transition plans. No answer. HIPAA compliant voicemail message left to request return call.   Will communicate with [facility] SNF SW..  Will continue to follow for potential [facility] Care Management needs while member resides in SNF.     Marthenia Rolling, MSN, RN,BSN [place] Acute Care Coordinator [phone] [facility]) [phone]  (Toll free office)

## 2020-07-07 DIAGNOSIS — F0390 Unspecified dementia without behavioral disturbance: Secondary | ICD-10-CM | POA: Diagnosis not present

## 2020-07-07 DIAGNOSIS — R2681 Unsteadiness on feet: Secondary | ICD-10-CM | POA: Diagnosis not present

## 2020-07-07 DIAGNOSIS — E119 Type 2 diabetes mellitus without complications: Secondary | ICD-10-CM | POA: Diagnosis not present

## 2020-07-07 DIAGNOSIS — D649 Anemia, unspecified: Secondary | ICD-10-CM | POA: Diagnosis not present

## 2020-07-07 DIAGNOSIS — G9341 Metabolic encephalopathy: Secondary | ICD-10-CM | POA: Diagnosis not present

## 2020-07-07 DIAGNOSIS — M6281 Muscle weakness (generalized): Secondary | ICD-10-CM | POA: Diagnosis not present

## 2020-07-07 DIAGNOSIS — I10 Essential (primary) hypertension: Secondary | ICD-10-CM | POA: Diagnosis not present

## 2020-07-07 DIAGNOSIS — J211 Acute bronchiolitis due to human metapneumovirus: Secondary | ICD-10-CM | POA: Diagnosis not present

## 2020-07-07 DIAGNOSIS — J159 Unspecified bacterial pneumonia: Secondary | ICD-10-CM | POA: Diagnosis not present

## 2020-07-07 DIAGNOSIS — N189 Chronic kidney disease, unspecified: Secondary | ICD-10-CM | POA: Diagnosis not present

## 2020-07-07 DIAGNOSIS — E782 Mixed hyperlipidemia: Secondary | ICD-10-CM | POA: Diagnosis not present

## 2020-07-07 DIAGNOSIS — I739 Peripheral vascular disease, unspecified: Secondary | ICD-10-CM | POA: Diagnosis not present

## 2020-07-11 DIAGNOSIS — M6281 Muscle weakness (generalized): Secondary | ICD-10-CM | POA: Diagnosis not present

## 2020-07-11 DIAGNOSIS — R2681 Unsteadiness on feet: Secondary | ICD-10-CM | POA: Diagnosis not present

## 2020-07-11 DIAGNOSIS — I10 Essential (primary) hypertension: Secondary | ICD-10-CM | POA: Diagnosis not present

## 2020-07-11 DIAGNOSIS — E119 Type 2 diabetes mellitus without complications: Secondary | ICD-10-CM | POA: Diagnosis not present

## 2020-07-11 DIAGNOSIS — N189 Chronic kidney disease, unspecified: Secondary | ICD-10-CM | POA: Diagnosis not present

## 2020-07-11 DIAGNOSIS — R42 Dizziness and giddiness: Secondary | ICD-10-CM | POA: Diagnosis not present

## 2020-07-11 DIAGNOSIS — E782 Mixed hyperlipidemia: Secondary | ICD-10-CM | POA: Diagnosis not present

## 2020-07-11 DIAGNOSIS — F0390 Unspecified dementia without behavioral disturbance: Secondary | ICD-10-CM | POA: Diagnosis not present

## 2020-07-11 DIAGNOSIS — I951 Orthostatic hypotension: Secondary | ICD-10-CM | POA: Diagnosis not present

## 2020-07-11 DIAGNOSIS — E1159 Type 2 diabetes mellitus with other circulatory complications: Secondary | ICD-10-CM | POA: Diagnosis not present

## 2020-07-11 DIAGNOSIS — I739 Peripheral vascular disease, unspecified: Secondary | ICD-10-CM | POA: Diagnosis not present

## 2020-07-11 DIAGNOSIS — D649 Anemia, unspecified: Secondary | ICD-10-CM | POA: Diagnosis not present

## 2020-07-11 DIAGNOSIS — J159 Unspecified bacterial pneumonia: Secondary | ICD-10-CM | POA: Diagnosis not present

## 2020-07-11 DIAGNOSIS — J211 Acute bronchiolitis due to human metapneumovirus: Secondary | ICD-10-CM | POA: Diagnosis not present

## 2020-07-11 DIAGNOSIS — G9341 Metabolic encephalopathy: Secondary | ICD-10-CM | POA: Diagnosis not present

## 2020-07-12 DIAGNOSIS — R42 Dizziness and giddiness: Secondary | ICD-10-CM | POA: Diagnosis not present

## 2020-07-12 DIAGNOSIS — R55 Syncope and collapse: Secondary | ICD-10-CM | POA: Diagnosis not present

## 2020-07-12 DIAGNOSIS — I152 Hypertension secondary to endocrine disorders: Secondary | ICD-10-CM | POA: Diagnosis not present

## 2020-07-12 DIAGNOSIS — G903 Multi-system degeneration of the autonomic nervous system: Secondary | ICD-10-CM | POA: Diagnosis not present

## 2020-07-14 DIAGNOSIS — G9341 Metabolic encephalopathy: Secondary | ICD-10-CM | POA: Diagnosis not present

## 2020-07-14 DIAGNOSIS — D649 Anemia, unspecified: Secondary | ICD-10-CM | POA: Diagnosis not present

## 2020-07-14 DIAGNOSIS — I739 Peripheral vascular disease, unspecified: Secondary | ICD-10-CM | POA: Diagnosis not present

## 2020-07-14 DIAGNOSIS — F0390 Unspecified dementia without behavioral disturbance: Secondary | ICD-10-CM | POA: Diagnosis not present

## 2020-07-14 DIAGNOSIS — M6281 Muscle weakness (generalized): Secondary | ICD-10-CM | POA: Diagnosis not present

## 2020-07-14 DIAGNOSIS — I10 Essential (primary) hypertension: Secondary | ICD-10-CM | POA: Diagnosis not present

## 2020-07-14 DIAGNOSIS — E119 Type 2 diabetes mellitus without complications: Secondary | ICD-10-CM | POA: Diagnosis not present

## 2020-07-14 DIAGNOSIS — N189 Chronic kidney disease, unspecified: Secondary | ICD-10-CM | POA: Diagnosis not present

## 2020-07-14 DIAGNOSIS — J211 Acute bronchiolitis due to human metapneumovirus: Secondary | ICD-10-CM | POA: Diagnosis not present

## 2020-07-14 DIAGNOSIS — J159 Unspecified bacterial pneumonia: Secondary | ICD-10-CM | POA: Diagnosis not present

## 2020-07-14 DIAGNOSIS — E782 Mixed hyperlipidemia: Secondary | ICD-10-CM | POA: Diagnosis not present

## 2020-07-14 DIAGNOSIS — R2681 Unsteadiness on feet: Secondary | ICD-10-CM | POA: Diagnosis not present

## 2020-07-18 DIAGNOSIS — M6281 Muscle weakness (generalized): Secondary | ICD-10-CM | POA: Diagnosis not present

## 2020-07-18 DIAGNOSIS — J211 Acute bronchiolitis due to human metapneumovirus: Secondary | ICD-10-CM | POA: Diagnosis not present

## 2020-07-18 DIAGNOSIS — R2681 Unsteadiness on feet: Secondary | ICD-10-CM | POA: Diagnosis not present

## 2020-07-18 DIAGNOSIS — D649 Anemia, unspecified: Secondary | ICD-10-CM | POA: Diagnosis not present

## 2020-07-18 DIAGNOSIS — N189 Chronic kidney disease, unspecified: Secondary | ICD-10-CM | POA: Diagnosis not present

## 2020-07-18 DIAGNOSIS — G9341 Metabolic encephalopathy: Secondary | ICD-10-CM | POA: Diagnosis not present

## 2020-07-18 DIAGNOSIS — I739 Peripheral vascular disease, unspecified: Secondary | ICD-10-CM | POA: Diagnosis not present

## 2020-07-18 DIAGNOSIS — E119 Type 2 diabetes mellitus without complications: Secondary | ICD-10-CM | POA: Diagnosis not present

## 2020-07-18 DIAGNOSIS — F0390 Unspecified dementia without behavioral disturbance: Secondary | ICD-10-CM | POA: Diagnosis not present

## 2020-07-18 DIAGNOSIS — J159 Unspecified bacterial pneumonia: Secondary | ICD-10-CM | POA: Diagnosis not present

## 2020-07-18 DIAGNOSIS — E782 Mixed hyperlipidemia: Secondary | ICD-10-CM | POA: Diagnosis not present

## 2020-07-18 DIAGNOSIS — I10 Essential (primary) hypertension: Secondary | ICD-10-CM | POA: Diagnosis not present

## 2020-07-20 ENCOUNTER — Other Ambulatory Visit: Payer: Self-pay | Admitting: *Deleted

## 2020-07-20 NOTE — Patient Outreach (Signed)
[place] Coordinator follow up. Member screened for potential [facility] Care Management needs.  Mr. Faubion resides in [facility]. Update received from SNF SW stating Mr. Noblett will remain as LTC.   Will sign off. No identifiable [facility] Care Management needs at this time.    Marthenia Rolling, MSN, RN,BSN [place] Acute Care Coordinator [phone] [facility]) [phone]  (Toll free office)

## 2020-07-22 DIAGNOSIS — D649 Anemia, unspecified: Secondary | ICD-10-CM | POA: Diagnosis not present

## 2020-07-22 DIAGNOSIS — G903 Multi-system degeneration of the autonomic nervous system: Secondary | ICD-10-CM | POA: Diagnosis not present

## 2020-07-22 DIAGNOSIS — R5381 Other malaise: Secondary | ICD-10-CM | POA: Diagnosis not present

## 2020-07-22 DIAGNOSIS — F0390 Unspecified dementia without behavioral disturbance: Secondary | ICD-10-CM | POA: Diagnosis not present

## 2020-07-25 DIAGNOSIS — J211 Acute bronchiolitis due to human metapneumovirus: Secondary | ICD-10-CM | POA: Diagnosis not present

## 2020-07-25 DIAGNOSIS — G9341 Metabolic encephalopathy: Secondary | ICD-10-CM | POA: Diagnosis not present

## 2020-07-25 DIAGNOSIS — I10 Essential (primary) hypertension: Secondary | ICD-10-CM | POA: Diagnosis not present

## 2020-07-25 DIAGNOSIS — E782 Mixed hyperlipidemia: Secondary | ICD-10-CM | POA: Diagnosis not present

## 2020-07-25 DIAGNOSIS — M6281 Muscle weakness (generalized): Secondary | ICD-10-CM | POA: Diagnosis not present

## 2020-07-25 DIAGNOSIS — E119 Type 2 diabetes mellitus without complications: Secondary | ICD-10-CM | POA: Diagnosis not present

## 2020-07-25 DIAGNOSIS — F0390 Unspecified dementia without behavioral disturbance: Secondary | ICD-10-CM | POA: Diagnosis not present

## 2020-07-25 DIAGNOSIS — N189 Chronic kidney disease, unspecified: Secondary | ICD-10-CM | POA: Diagnosis not present

## 2020-07-25 DIAGNOSIS — I739 Peripheral vascular disease, unspecified: Secondary | ICD-10-CM | POA: Diagnosis not present

## 2020-07-25 DIAGNOSIS — D649 Anemia, unspecified: Secondary | ICD-10-CM | POA: Diagnosis not present

## 2020-07-25 DIAGNOSIS — R2681 Unsteadiness on feet: Secondary | ICD-10-CM | POA: Diagnosis not present

## 2020-07-25 DIAGNOSIS — J159 Unspecified bacterial pneumonia: Secondary | ICD-10-CM | POA: Diagnosis not present

## 2020-07-28 DIAGNOSIS — Z794 Long term (current) use of insulin: Secondary | ICD-10-CM | POA: Diagnosis not present

## 2020-07-28 DIAGNOSIS — I152 Hypertension secondary to endocrine disorders: Secondary | ICD-10-CM | POA: Diagnosis not present

## 2020-07-28 DIAGNOSIS — E1159 Type 2 diabetes mellitus with other circulatory complications: Secondary | ICD-10-CM | POA: Diagnosis not present

## 2020-07-28 DIAGNOSIS — F0390 Unspecified dementia without behavioral disturbance: Secondary | ICD-10-CM | POA: Diagnosis not present

## 2020-08-01 DIAGNOSIS — D649 Anemia, unspecified: Secondary | ICD-10-CM | POA: Diagnosis not present

## 2020-08-01 DIAGNOSIS — F0390 Unspecified dementia without behavioral disturbance: Secondary | ICD-10-CM | POA: Diagnosis not present

## 2020-08-01 DIAGNOSIS — M6281 Muscle weakness (generalized): Secondary | ICD-10-CM | POA: Diagnosis not present

## 2020-08-01 DIAGNOSIS — E782 Mixed hyperlipidemia: Secondary | ICD-10-CM | POA: Diagnosis not present

## 2020-08-01 DIAGNOSIS — J211 Acute bronchiolitis due to human metapneumovirus: Secondary | ICD-10-CM | POA: Diagnosis not present

## 2020-08-01 DIAGNOSIS — G9341 Metabolic encephalopathy: Secondary | ICD-10-CM | POA: Diagnosis not present

## 2020-08-01 DIAGNOSIS — E119 Type 2 diabetes mellitus without complications: Secondary | ICD-10-CM | POA: Diagnosis not present

## 2020-08-01 DIAGNOSIS — R2681 Unsteadiness on feet: Secondary | ICD-10-CM | POA: Diagnosis not present

## 2020-08-01 DIAGNOSIS — J159 Unspecified bacterial pneumonia: Secondary | ICD-10-CM | POA: Diagnosis not present

## 2020-08-01 DIAGNOSIS — I739 Peripheral vascular disease, unspecified: Secondary | ICD-10-CM | POA: Diagnosis not present

## 2020-08-01 DIAGNOSIS — I10 Essential (primary) hypertension: Secondary | ICD-10-CM | POA: Diagnosis not present

## 2020-08-01 DIAGNOSIS — N189 Chronic kidney disease, unspecified: Secondary | ICD-10-CM | POA: Diagnosis not present

## 2020-08-02 DIAGNOSIS — I152 Hypertension secondary to endocrine disorders: Secondary | ICD-10-CM | POA: Diagnosis not present

## 2020-08-02 DIAGNOSIS — D638 Anemia in other chronic diseases classified elsewhere: Secondary | ICD-10-CM | POA: Diagnosis not present

## 2020-08-02 DIAGNOSIS — F0390 Unspecified dementia without behavioral disturbance: Secondary | ICD-10-CM | POA: Diagnosis not present

## 2020-08-02 DIAGNOSIS — Z0189 Encounter for other specified special examinations: Secondary | ICD-10-CM | POA: Diagnosis not present

## 2020-08-23 DIAGNOSIS — H04123 Dry eye syndrome of bilateral lacrimal glands: Secondary | ICD-10-CM | POA: Diagnosis not present

## 2020-08-30 DIAGNOSIS — E1159 Type 2 diabetes mellitus with other circulatory complications: Secondary | ICD-10-CM | POA: Diagnosis not present

## 2020-08-30 DIAGNOSIS — H269 Unspecified cataract: Secondary | ICD-10-CM | POA: Diagnosis not present

## 2020-08-30 DIAGNOSIS — Z794 Long term (current) use of insulin: Secondary | ICD-10-CM | POA: Diagnosis not present

## 2020-08-30 DIAGNOSIS — H04123 Dry eye syndrome of bilateral lacrimal glands: Secondary | ICD-10-CM | POA: Diagnosis not present

## 2020-09-06 DIAGNOSIS — Z20822 Contact with and (suspected) exposure to covid-19: Secondary | ICD-10-CM | POA: Diagnosis not present

## 2020-09-19 DIAGNOSIS — E1159 Type 2 diabetes mellitus with other circulatory complications: Secondary | ICD-10-CM | POA: Diagnosis not present

## 2020-09-19 DIAGNOSIS — N183 Chronic kidney disease, stage 3 unspecified: Secondary | ICD-10-CM | POA: Diagnosis not present

## 2020-09-19 DIAGNOSIS — D6489 Other specified anemias: Secondary | ICD-10-CM | POA: Diagnosis not present

## 2020-09-19 DIAGNOSIS — F330 Major depressive disorder, recurrent, mild: Secondary | ICD-10-CM | POA: Diagnosis not present

## 2020-09-19 DIAGNOSIS — F0280 Dementia in other diseases classified elsewhere without behavioral disturbance: Secondary | ICD-10-CM | POA: Diagnosis not present

## 2020-09-19 DIAGNOSIS — I951 Orthostatic hypotension: Secondary | ICD-10-CM | POA: Diagnosis not present

## 2020-09-19 DIAGNOSIS — F3289 Other specified depressive episodes: Secondary | ICD-10-CM | POA: Diagnosis not present

## 2020-09-19 DIAGNOSIS — F0390 Unspecified dementia without behavioral disturbance: Secondary | ICD-10-CM | POA: Diagnosis not present

## 2020-09-19 DIAGNOSIS — H04123 Dry eye syndrome of bilateral lacrimal glands: Secondary | ICD-10-CM | POA: Diagnosis not present

## 2020-09-19 DIAGNOSIS — I6789 Other cerebrovascular disease: Secondary | ICD-10-CM | POA: Diagnosis not present

## 2020-10-03 DIAGNOSIS — B351 Tinea unguium: Secondary | ICD-10-CM | POA: Diagnosis not present

## 2020-10-03 DIAGNOSIS — E1151 Type 2 diabetes mellitus with diabetic peripheral angiopathy without gangrene: Secondary | ICD-10-CM | POA: Diagnosis not present

## 2020-10-03 DIAGNOSIS — L603 Nail dystrophy: Secondary | ICD-10-CM | POA: Diagnosis not present

## 2020-10-20 DIAGNOSIS — R1312 Dysphagia, oropharyngeal phase: Secondary | ICD-10-CM | POA: Diagnosis not present

## 2020-10-20 DIAGNOSIS — G3183 Dementia with Lewy bodies: Secondary | ICD-10-CM | POA: Diagnosis not present

## 2020-10-20 DIAGNOSIS — Z8673 Personal history of transient ischemic attack (TIA), and cerebral infarction without residual deficits: Secondary | ICD-10-CM | POA: Diagnosis not present

## 2020-10-20 DIAGNOSIS — G9341 Metabolic encephalopathy: Secondary | ICD-10-CM | POA: Diagnosis not present

## 2020-10-20 DIAGNOSIS — R2681 Unsteadiness on feet: Secondary | ICD-10-CM | POA: Diagnosis not present

## 2020-10-20 DIAGNOSIS — R2689 Other abnormalities of gait and mobility: Secondary | ICD-10-CM | POA: Diagnosis not present

## 2020-10-20 DIAGNOSIS — R41841 Cognitive communication deficit: Secondary | ICD-10-CM | POA: Diagnosis not present

## 2020-10-20 DIAGNOSIS — R262 Difficulty in walking, not elsewhere classified: Secondary | ICD-10-CM | POA: Diagnosis not present

## 2020-10-20 DIAGNOSIS — M6281 Muscle weakness (generalized): Secondary | ICD-10-CM | POA: Diagnosis not present

## 2020-10-21 DIAGNOSIS — R2681 Unsteadiness on feet: Secondary | ICD-10-CM | POA: Diagnosis not present

## 2020-10-21 DIAGNOSIS — G9341 Metabolic encephalopathy: Secondary | ICD-10-CM | POA: Diagnosis not present

## 2020-10-21 DIAGNOSIS — R2689 Other abnormalities of gait and mobility: Secondary | ICD-10-CM | POA: Diagnosis not present

## 2020-10-21 DIAGNOSIS — R41841 Cognitive communication deficit: Secondary | ICD-10-CM | POA: Diagnosis not present

## 2020-10-21 DIAGNOSIS — R1312 Dysphagia, oropharyngeal phase: Secondary | ICD-10-CM | POA: Diagnosis not present

## 2020-10-21 DIAGNOSIS — M6281 Muscle weakness (generalized): Secondary | ICD-10-CM | POA: Diagnosis not present

## 2020-10-24 DIAGNOSIS — G9341 Metabolic encephalopathy: Secondary | ICD-10-CM | POA: Diagnosis not present

## 2020-10-24 DIAGNOSIS — R2681 Unsteadiness on feet: Secondary | ICD-10-CM | POA: Diagnosis not present

## 2020-10-24 DIAGNOSIS — R2689 Other abnormalities of gait and mobility: Secondary | ICD-10-CM | POA: Diagnosis not present

## 2020-10-24 DIAGNOSIS — R41841 Cognitive communication deficit: Secondary | ICD-10-CM | POA: Diagnosis not present

## 2020-10-24 DIAGNOSIS — R1312 Dysphagia, oropharyngeal phase: Secondary | ICD-10-CM | POA: Diagnosis not present

## 2020-10-24 DIAGNOSIS — M6281 Muscle weakness (generalized): Secondary | ICD-10-CM | POA: Diagnosis not present

## 2020-10-25 DIAGNOSIS — R2681 Unsteadiness on feet: Secondary | ICD-10-CM | POA: Diagnosis not present

## 2020-10-25 DIAGNOSIS — R1312 Dysphagia, oropharyngeal phase: Secondary | ICD-10-CM | POA: Diagnosis not present

## 2020-10-25 DIAGNOSIS — G9341 Metabolic encephalopathy: Secondary | ICD-10-CM | POA: Diagnosis not present

## 2020-10-25 DIAGNOSIS — R2689 Other abnormalities of gait and mobility: Secondary | ICD-10-CM | POA: Diagnosis not present

## 2020-10-25 DIAGNOSIS — M6281 Muscle weakness (generalized): Secondary | ICD-10-CM | POA: Diagnosis not present

## 2020-10-25 DIAGNOSIS — R41841 Cognitive communication deficit: Secondary | ICD-10-CM | POA: Diagnosis not present

## 2020-10-26 DIAGNOSIS — M6281 Muscle weakness (generalized): Secondary | ICD-10-CM | POA: Diagnosis not present

## 2020-10-26 DIAGNOSIS — G9341 Metabolic encephalopathy: Secondary | ICD-10-CM | POA: Diagnosis not present

## 2020-10-26 DIAGNOSIS — R2681 Unsteadiness on feet: Secondary | ICD-10-CM | POA: Diagnosis not present

## 2020-10-26 DIAGNOSIS — R2689 Other abnormalities of gait and mobility: Secondary | ICD-10-CM | POA: Diagnosis not present

## 2020-10-26 DIAGNOSIS — R41841 Cognitive communication deficit: Secondary | ICD-10-CM | POA: Diagnosis not present

## 2020-10-26 DIAGNOSIS — R1312 Dysphagia, oropharyngeal phase: Secondary | ICD-10-CM | POA: Diagnosis not present

## 2020-10-27 DIAGNOSIS — M6281 Muscle weakness (generalized): Secondary | ICD-10-CM | POA: Diagnosis not present

## 2020-10-27 DIAGNOSIS — R2689 Other abnormalities of gait and mobility: Secondary | ICD-10-CM | POA: Diagnosis not present

## 2020-10-27 DIAGNOSIS — G9341 Metabolic encephalopathy: Secondary | ICD-10-CM | POA: Diagnosis not present

## 2020-10-27 DIAGNOSIS — R41841 Cognitive communication deficit: Secondary | ICD-10-CM | POA: Diagnosis not present

## 2020-10-27 DIAGNOSIS — R2681 Unsteadiness on feet: Secondary | ICD-10-CM | POA: Diagnosis not present

## 2020-10-27 DIAGNOSIS — R1312 Dysphagia, oropharyngeal phase: Secondary | ICD-10-CM | POA: Diagnosis not present

## 2020-10-28 DIAGNOSIS — R059 Cough, unspecified: Secondary | ICD-10-CM | POA: Diagnosis not present

## 2020-10-28 DIAGNOSIS — R41841 Cognitive communication deficit: Secondary | ICD-10-CM | POA: Diagnosis not present

## 2020-10-28 DIAGNOSIS — R1312 Dysphagia, oropharyngeal phase: Secondary | ICD-10-CM | POA: Diagnosis not present

## 2020-10-28 DIAGNOSIS — R2681 Unsteadiness on feet: Secondary | ICD-10-CM | POA: Diagnosis not present

## 2020-10-28 DIAGNOSIS — E1159 Type 2 diabetes mellitus with other circulatory complications: Secondary | ICD-10-CM | POA: Diagnosis not present

## 2020-10-28 DIAGNOSIS — R2689 Other abnormalities of gait and mobility: Secondary | ICD-10-CM | POA: Diagnosis not present

## 2020-10-28 DIAGNOSIS — Z794 Long term (current) use of insulin: Secondary | ICD-10-CM | POA: Diagnosis not present

## 2020-10-28 DIAGNOSIS — I152 Hypertension secondary to endocrine disorders: Secondary | ICD-10-CM | POA: Diagnosis not present

## 2020-10-28 DIAGNOSIS — M6281 Muscle weakness (generalized): Secondary | ICD-10-CM | POA: Diagnosis not present

## 2020-10-28 DIAGNOSIS — G9341 Metabolic encephalopathy: Secondary | ICD-10-CM | POA: Diagnosis not present

## 2020-10-31 DIAGNOSIS — R2689 Other abnormalities of gait and mobility: Secondary | ICD-10-CM | POA: Diagnosis not present

## 2020-10-31 DIAGNOSIS — G9341 Metabolic encephalopathy: Secondary | ICD-10-CM | POA: Diagnosis not present

## 2020-10-31 DIAGNOSIS — R1312 Dysphagia, oropharyngeal phase: Secondary | ICD-10-CM | POA: Diagnosis not present

## 2020-10-31 DIAGNOSIS — R2681 Unsteadiness on feet: Secondary | ICD-10-CM | POA: Diagnosis not present

## 2020-10-31 DIAGNOSIS — R41841 Cognitive communication deficit: Secondary | ICD-10-CM | POA: Diagnosis not present

## 2020-10-31 DIAGNOSIS — M6281 Muscle weakness (generalized): Secondary | ICD-10-CM | POA: Diagnosis not present

## 2020-11-01 DIAGNOSIS — R2689 Other abnormalities of gait and mobility: Secondary | ICD-10-CM | POA: Diagnosis not present

## 2020-11-01 DIAGNOSIS — R41841 Cognitive communication deficit: Secondary | ICD-10-CM | POA: Diagnosis not present

## 2020-11-01 DIAGNOSIS — R2681 Unsteadiness on feet: Secondary | ICD-10-CM | POA: Diagnosis not present

## 2020-11-01 DIAGNOSIS — G9341 Metabolic encephalopathy: Secondary | ICD-10-CM | POA: Diagnosis not present

## 2020-11-01 DIAGNOSIS — M6281 Muscle weakness (generalized): Secondary | ICD-10-CM | POA: Diagnosis not present

## 2020-11-01 DIAGNOSIS — R1312 Dysphagia, oropharyngeal phase: Secondary | ICD-10-CM | POA: Diagnosis not present

## 2020-11-02 DIAGNOSIS — R1312 Dysphagia, oropharyngeal phase: Secondary | ICD-10-CM | POA: Diagnosis not present

## 2020-11-02 DIAGNOSIS — R2681 Unsteadiness on feet: Secondary | ICD-10-CM | POA: Diagnosis not present

## 2020-11-02 DIAGNOSIS — R2689 Other abnormalities of gait and mobility: Secondary | ICD-10-CM | POA: Diagnosis not present

## 2020-11-02 DIAGNOSIS — R41841 Cognitive communication deficit: Secondary | ICD-10-CM | POA: Diagnosis not present

## 2020-11-02 DIAGNOSIS — G9341 Metabolic encephalopathy: Secondary | ICD-10-CM | POA: Diagnosis not present

## 2020-11-02 DIAGNOSIS — M6281 Muscle weakness (generalized): Secondary | ICD-10-CM | POA: Diagnosis not present

## 2020-11-03 DIAGNOSIS — R2681 Unsteadiness on feet: Secondary | ICD-10-CM | POA: Diagnosis not present

## 2020-11-03 DIAGNOSIS — R41841 Cognitive communication deficit: Secondary | ICD-10-CM | POA: Diagnosis not present

## 2020-11-03 DIAGNOSIS — G9341 Metabolic encephalopathy: Secondary | ICD-10-CM | POA: Diagnosis not present

## 2020-11-03 DIAGNOSIS — R1312 Dysphagia, oropharyngeal phase: Secondary | ICD-10-CM | POA: Diagnosis not present

## 2020-11-03 DIAGNOSIS — Z8673 Personal history of transient ischemic attack (TIA), and cerebral infarction without residual deficits: Secondary | ICD-10-CM | POA: Diagnosis not present

## 2020-11-03 DIAGNOSIS — Z9181 History of falling: Secondary | ICD-10-CM | POA: Diagnosis not present

## 2020-11-03 DIAGNOSIS — M6281 Muscle weakness (generalized): Secondary | ICD-10-CM | POA: Diagnosis not present

## 2020-11-03 DIAGNOSIS — R262 Difficulty in walking, not elsewhere classified: Secondary | ICD-10-CM | POA: Diagnosis not present

## 2020-11-03 DIAGNOSIS — R2689 Other abnormalities of gait and mobility: Secondary | ICD-10-CM | POA: Diagnosis not present

## 2020-11-03 DIAGNOSIS — G3183 Dementia with Lewy bodies: Secondary | ICD-10-CM | POA: Diagnosis not present

## 2020-11-04 DIAGNOSIS — R2681 Unsteadiness on feet: Secondary | ICD-10-CM | POA: Diagnosis not present

## 2020-11-04 DIAGNOSIS — R2689 Other abnormalities of gait and mobility: Secondary | ICD-10-CM | POA: Diagnosis not present

## 2020-11-04 DIAGNOSIS — M6281 Muscle weakness (generalized): Secondary | ICD-10-CM | POA: Diagnosis not present

## 2020-11-04 DIAGNOSIS — R1312 Dysphagia, oropharyngeal phase: Secondary | ICD-10-CM | POA: Diagnosis not present

## 2020-11-04 DIAGNOSIS — G9341 Metabolic encephalopathy: Secondary | ICD-10-CM | POA: Diagnosis not present

## 2020-11-04 DIAGNOSIS — R41841 Cognitive communication deficit: Secondary | ICD-10-CM | POA: Diagnosis not present

## 2020-11-07 DIAGNOSIS — R41841 Cognitive communication deficit: Secondary | ICD-10-CM | POA: Diagnosis not present

## 2020-11-07 DIAGNOSIS — G9341 Metabolic encephalopathy: Secondary | ICD-10-CM | POA: Diagnosis not present

## 2020-11-07 DIAGNOSIS — R2689 Other abnormalities of gait and mobility: Secondary | ICD-10-CM | POA: Diagnosis not present

## 2020-11-07 DIAGNOSIS — M6281 Muscle weakness (generalized): Secondary | ICD-10-CM | POA: Diagnosis not present

## 2020-11-07 DIAGNOSIS — R1312 Dysphagia, oropharyngeal phase: Secondary | ICD-10-CM | POA: Diagnosis not present

## 2020-11-07 DIAGNOSIS — R2681 Unsteadiness on feet: Secondary | ICD-10-CM | POA: Diagnosis not present

## 2020-11-08 DIAGNOSIS — G9341 Metabolic encephalopathy: Secondary | ICD-10-CM | POA: Diagnosis not present

## 2020-11-08 DIAGNOSIS — R41841 Cognitive communication deficit: Secondary | ICD-10-CM | POA: Diagnosis not present

## 2020-11-08 DIAGNOSIS — R1312 Dysphagia, oropharyngeal phase: Secondary | ICD-10-CM | POA: Diagnosis not present

## 2020-11-08 DIAGNOSIS — R2681 Unsteadiness on feet: Secondary | ICD-10-CM | POA: Diagnosis not present

## 2020-11-08 DIAGNOSIS — R2689 Other abnormalities of gait and mobility: Secondary | ICD-10-CM | POA: Diagnosis not present

## 2020-11-08 DIAGNOSIS — M6281 Muscle weakness (generalized): Secondary | ICD-10-CM | POA: Diagnosis not present

## 2020-11-09 DIAGNOSIS — M6281 Muscle weakness (generalized): Secondary | ICD-10-CM | POA: Diagnosis not present

## 2020-11-09 DIAGNOSIS — R2681 Unsteadiness on feet: Secondary | ICD-10-CM | POA: Diagnosis not present

## 2020-11-09 DIAGNOSIS — R41841 Cognitive communication deficit: Secondary | ICD-10-CM | POA: Diagnosis not present

## 2020-11-09 DIAGNOSIS — R1312 Dysphagia, oropharyngeal phase: Secondary | ICD-10-CM | POA: Diagnosis not present

## 2020-11-09 DIAGNOSIS — G9341 Metabolic encephalopathy: Secondary | ICD-10-CM | POA: Diagnosis not present

## 2020-11-09 DIAGNOSIS — R2689 Other abnormalities of gait and mobility: Secondary | ICD-10-CM | POA: Diagnosis not present

## 2020-11-10 DIAGNOSIS — R2681 Unsteadiness on feet: Secondary | ICD-10-CM | POA: Diagnosis not present

## 2020-11-10 DIAGNOSIS — R41841 Cognitive communication deficit: Secondary | ICD-10-CM | POA: Diagnosis not present

## 2020-11-10 DIAGNOSIS — G9341 Metabolic encephalopathy: Secondary | ICD-10-CM | POA: Diagnosis not present

## 2020-11-10 DIAGNOSIS — M6281 Muscle weakness (generalized): Secondary | ICD-10-CM | POA: Diagnosis not present

## 2020-11-10 DIAGNOSIS — R2689 Other abnormalities of gait and mobility: Secondary | ICD-10-CM | POA: Diagnosis not present

## 2020-11-10 DIAGNOSIS — R1312 Dysphagia, oropharyngeal phase: Secondary | ICD-10-CM | POA: Diagnosis not present

## 2020-11-11 DIAGNOSIS — G9341 Metabolic encephalopathy: Secondary | ICD-10-CM | POA: Diagnosis not present

## 2020-11-11 DIAGNOSIS — R2689 Other abnormalities of gait and mobility: Secondary | ICD-10-CM | POA: Diagnosis not present

## 2020-11-11 DIAGNOSIS — R2681 Unsteadiness on feet: Secondary | ICD-10-CM | POA: Diagnosis not present

## 2020-11-11 DIAGNOSIS — R1312 Dysphagia, oropharyngeal phase: Secondary | ICD-10-CM | POA: Diagnosis not present

## 2020-11-11 DIAGNOSIS — M6281 Muscle weakness (generalized): Secondary | ICD-10-CM | POA: Diagnosis not present

## 2020-11-11 DIAGNOSIS — R41841 Cognitive communication deficit: Secondary | ICD-10-CM | POA: Diagnosis not present

## 2020-11-12 DIAGNOSIS — R2681 Unsteadiness on feet: Secondary | ICD-10-CM | POA: Diagnosis not present

## 2020-11-12 DIAGNOSIS — G9341 Metabolic encephalopathy: Secondary | ICD-10-CM | POA: Diagnosis not present

## 2020-11-12 DIAGNOSIS — M6281 Muscle weakness (generalized): Secondary | ICD-10-CM | POA: Diagnosis not present

## 2020-11-12 DIAGNOSIS — R2689 Other abnormalities of gait and mobility: Secondary | ICD-10-CM | POA: Diagnosis not present

## 2020-11-12 DIAGNOSIS — R1312 Dysphagia, oropharyngeal phase: Secondary | ICD-10-CM | POA: Diagnosis not present

## 2020-11-12 DIAGNOSIS — R41841 Cognitive communication deficit: Secondary | ICD-10-CM | POA: Diagnosis not present

## 2020-11-15 DIAGNOSIS — M6281 Muscle weakness (generalized): Secondary | ICD-10-CM | POA: Diagnosis not present

## 2020-11-15 DIAGNOSIS — R1312 Dysphagia, oropharyngeal phase: Secondary | ICD-10-CM | POA: Diagnosis not present

## 2020-11-15 DIAGNOSIS — G9341 Metabolic encephalopathy: Secondary | ICD-10-CM | POA: Diagnosis not present

## 2020-11-15 DIAGNOSIS — R2681 Unsteadiness on feet: Secondary | ICD-10-CM | POA: Diagnosis not present

## 2020-11-15 DIAGNOSIS — R41841 Cognitive communication deficit: Secondary | ICD-10-CM | POA: Diagnosis not present

## 2020-11-15 DIAGNOSIS — R2689 Other abnormalities of gait and mobility: Secondary | ICD-10-CM | POA: Diagnosis not present

## 2020-11-17 DIAGNOSIS — R41841 Cognitive communication deficit: Secondary | ICD-10-CM | POA: Diagnosis not present

## 2020-11-17 DIAGNOSIS — G9341 Metabolic encephalopathy: Secondary | ICD-10-CM | POA: Diagnosis not present

## 2020-11-17 DIAGNOSIS — M6281 Muscle weakness (generalized): Secondary | ICD-10-CM | POA: Diagnosis not present

## 2020-11-17 DIAGNOSIS — R1312 Dysphagia, oropharyngeal phase: Secondary | ICD-10-CM | POA: Diagnosis not present

## 2020-11-17 DIAGNOSIS — R2689 Other abnormalities of gait and mobility: Secondary | ICD-10-CM | POA: Diagnosis not present

## 2020-11-17 DIAGNOSIS — R2681 Unsteadiness on feet: Secondary | ICD-10-CM | POA: Diagnosis not present

## 2020-11-21 DIAGNOSIS — R1312 Dysphagia, oropharyngeal phase: Secondary | ICD-10-CM | POA: Diagnosis not present

## 2020-11-21 DIAGNOSIS — R41841 Cognitive communication deficit: Secondary | ICD-10-CM | POA: Diagnosis not present

## 2020-11-21 DIAGNOSIS — M6281 Muscle weakness (generalized): Secondary | ICD-10-CM | POA: Diagnosis not present

## 2020-11-21 DIAGNOSIS — G9341 Metabolic encephalopathy: Secondary | ICD-10-CM | POA: Diagnosis not present

## 2020-11-21 DIAGNOSIS — R2681 Unsteadiness on feet: Secondary | ICD-10-CM | POA: Diagnosis not present

## 2020-11-21 DIAGNOSIS — R2689 Other abnormalities of gait and mobility: Secondary | ICD-10-CM | POA: Diagnosis not present

## 2020-11-22 DIAGNOSIS — R41841 Cognitive communication deficit: Secondary | ICD-10-CM | POA: Diagnosis not present

## 2020-11-22 DIAGNOSIS — R1312 Dysphagia, oropharyngeal phase: Secondary | ICD-10-CM | POA: Diagnosis not present

## 2020-11-22 DIAGNOSIS — R2689 Other abnormalities of gait and mobility: Secondary | ICD-10-CM | POA: Diagnosis not present

## 2020-11-22 DIAGNOSIS — G9341 Metabolic encephalopathy: Secondary | ICD-10-CM | POA: Diagnosis not present

## 2020-11-22 DIAGNOSIS — R2681 Unsteadiness on feet: Secondary | ICD-10-CM | POA: Diagnosis not present

## 2020-11-22 DIAGNOSIS — M6281 Muscle weakness (generalized): Secondary | ICD-10-CM | POA: Diagnosis not present

## 2020-11-23 DIAGNOSIS — R2681 Unsteadiness on feet: Secondary | ICD-10-CM | POA: Diagnosis not present

## 2020-11-23 DIAGNOSIS — M6281 Muscle weakness (generalized): Secondary | ICD-10-CM | POA: Diagnosis not present

## 2020-11-23 DIAGNOSIS — R2689 Other abnormalities of gait and mobility: Secondary | ICD-10-CM | POA: Diagnosis not present

## 2020-11-23 DIAGNOSIS — G9341 Metabolic encephalopathy: Secondary | ICD-10-CM | POA: Diagnosis not present

## 2020-11-23 DIAGNOSIS — R41841 Cognitive communication deficit: Secondary | ICD-10-CM | POA: Diagnosis not present

## 2020-11-23 DIAGNOSIS — R1312 Dysphagia, oropharyngeal phase: Secondary | ICD-10-CM | POA: Diagnosis not present

## 2020-11-24 DIAGNOSIS — R2689 Other abnormalities of gait and mobility: Secondary | ICD-10-CM | POA: Diagnosis not present

## 2020-11-24 DIAGNOSIS — G9341 Metabolic encephalopathy: Secondary | ICD-10-CM | POA: Diagnosis not present

## 2020-11-24 DIAGNOSIS — M6281 Muscle weakness (generalized): Secondary | ICD-10-CM | POA: Diagnosis not present

## 2020-11-24 DIAGNOSIS — R1312 Dysphagia, oropharyngeal phase: Secondary | ICD-10-CM | POA: Diagnosis not present

## 2020-11-24 DIAGNOSIS — R2681 Unsteadiness on feet: Secondary | ICD-10-CM | POA: Diagnosis not present

## 2020-11-24 DIAGNOSIS — R41841 Cognitive communication deficit: Secondary | ICD-10-CM | POA: Diagnosis not present

## 2020-11-30 DIAGNOSIS — R41841 Cognitive communication deficit: Secondary | ICD-10-CM | POA: Diagnosis not present

## 2020-11-30 DIAGNOSIS — G9341 Metabolic encephalopathy: Secondary | ICD-10-CM | POA: Diagnosis not present

## 2020-11-30 DIAGNOSIS — M6281 Muscle weakness (generalized): Secondary | ICD-10-CM | POA: Diagnosis not present

## 2020-11-30 DIAGNOSIS — R2689 Other abnormalities of gait and mobility: Secondary | ICD-10-CM | POA: Diagnosis not present

## 2020-11-30 DIAGNOSIS — R2681 Unsteadiness on feet: Secondary | ICD-10-CM | POA: Diagnosis not present

## 2020-11-30 DIAGNOSIS — R1312 Dysphagia, oropharyngeal phase: Secondary | ICD-10-CM | POA: Diagnosis not present

## 2020-12-01 DIAGNOSIS — R1312 Dysphagia, oropharyngeal phase: Secondary | ICD-10-CM | POA: Diagnosis not present

## 2020-12-01 DIAGNOSIS — M6281 Muscle weakness (generalized): Secondary | ICD-10-CM | POA: Diagnosis not present

## 2020-12-01 DIAGNOSIS — R41841 Cognitive communication deficit: Secondary | ICD-10-CM | POA: Diagnosis not present

## 2020-12-01 DIAGNOSIS — R2681 Unsteadiness on feet: Secondary | ICD-10-CM | POA: Diagnosis not present

## 2020-12-01 DIAGNOSIS — G9341 Metabolic encephalopathy: Secondary | ICD-10-CM | POA: Diagnosis not present

## 2020-12-01 DIAGNOSIS — R2689 Other abnormalities of gait and mobility: Secondary | ICD-10-CM | POA: Diagnosis not present

## 2020-12-05 DIAGNOSIS — R2689 Other abnormalities of gait and mobility: Secondary | ICD-10-CM | POA: Diagnosis not present

## 2020-12-05 DIAGNOSIS — R2681 Unsteadiness on feet: Secondary | ICD-10-CM | POA: Diagnosis not present

## 2020-12-05 DIAGNOSIS — M6281 Muscle weakness (generalized): Secondary | ICD-10-CM | POA: Diagnosis not present

## 2020-12-05 DIAGNOSIS — Z9181 History of falling: Secondary | ICD-10-CM | POA: Diagnosis not present

## 2020-12-05 DIAGNOSIS — G3183 Dementia with Lewy bodies: Secondary | ICD-10-CM | POA: Diagnosis not present

## 2020-12-05 DIAGNOSIS — R41841 Cognitive communication deficit: Secondary | ICD-10-CM | POA: Diagnosis not present

## 2020-12-05 DIAGNOSIS — R1312 Dysphagia, oropharyngeal phase: Secondary | ICD-10-CM | POA: Diagnosis not present

## 2020-12-05 DIAGNOSIS — Z8673 Personal history of transient ischemic attack (TIA), and cerebral infarction without residual deficits: Secondary | ICD-10-CM | POA: Diagnosis not present

## 2020-12-05 DIAGNOSIS — G9341 Metabolic encephalopathy: Secondary | ICD-10-CM | POA: Diagnosis not present

## 2020-12-05 DIAGNOSIS — R262 Difficulty in walking, not elsewhere classified: Secondary | ICD-10-CM | POA: Diagnosis not present

## 2020-12-06 DIAGNOSIS — R2681 Unsteadiness on feet: Secondary | ICD-10-CM | POA: Diagnosis not present

## 2020-12-06 DIAGNOSIS — M6281 Muscle weakness (generalized): Secondary | ICD-10-CM | POA: Diagnosis not present

## 2020-12-06 DIAGNOSIS — R1312 Dysphagia, oropharyngeal phase: Secondary | ICD-10-CM | POA: Diagnosis not present

## 2020-12-06 DIAGNOSIS — G9341 Metabolic encephalopathy: Secondary | ICD-10-CM | POA: Diagnosis not present

## 2020-12-06 DIAGNOSIS — R2689 Other abnormalities of gait and mobility: Secondary | ICD-10-CM | POA: Diagnosis not present

## 2020-12-06 DIAGNOSIS — R41841 Cognitive communication deficit: Secondary | ICD-10-CM | POA: Diagnosis not present

## 2020-12-07 DIAGNOSIS — R2681 Unsteadiness on feet: Secondary | ICD-10-CM | POA: Diagnosis not present

## 2020-12-07 DIAGNOSIS — G9341 Metabolic encephalopathy: Secondary | ICD-10-CM | POA: Diagnosis not present

## 2020-12-07 DIAGNOSIS — R41841 Cognitive communication deficit: Secondary | ICD-10-CM | POA: Diagnosis not present

## 2020-12-07 DIAGNOSIS — R1312 Dysphagia, oropharyngeal phase: Secondary | ICD-10-CM | POA: Diagnosis not present

## 2020-12-07 DIAGNOSIS — M6281 Muscle weakness (generalized): Secondary | ICD-10-CM | POA: Diagnosis not present

## 2020-12-07 DIAGNOSIS — R2689 Other abnormalities of gait and mobility: Secondary | ICD-10-CM | POA: Diagnosis not present

## 2020-12-08 DIAGNOSIS — R1312 Dysphagia, oropharyngeal phase: Secondary | ICD-10-CM | POA: Diagnosis not present

## 2020-12-08 DIAGNOSIS — M6281 Muscle weakness (generalized): Secondary | ICD-10-CM | POA: Diagnosis not present

## 2020-12-08 DIAGNOSIS — R2689 Other abnormalities of gait and mobility: Secondary | ICD-10-CM | POA: Diagnosis not present

## 2020-12-08 DIAGNOSIS — G9341 Metabolic encephalopathy: Secondary | ICD-10-CM | POA: Diagnosis not present

## 2020-12-08 DIAGNOSIS — R2681 Unsteadiness on feet: Secondary | ICD-10-CM | POA: Diagnosis not present

## 2020-12-08 DIAGNOSIS — R41841 Cognitive communication deficit: Secondary | ICD-10-CM | POA: Diagnosis not present

## 2020-12-12 DIAGNOSIS — H524 Presbyopia: Secondary | ICD-10-CM | POA: Diagnosis not present

## 2020-12-12 DIAGNOSIS — E119 Type 2 diabetes mellitus without complications: Secondary | ICD-10-CM | POA: Diagnosis not present

## 2020-12-12 DIAGNOSIS — H1812 Bullous keratopathy, left eye: Secondary | ICD-10-CM | POA: Diagnosis not present

## 2020-12-12 DIAGNOSIS — Z961 Presence of intraocular lens: Secondary | ICD-10-CM | POA: Diagnosis not present

## 2020-12-13 DIAGNOSIS — M6281 Muscle weakness (generalized): Secondary | ICD-10-CM | POA: Diagnosis not present

## 2020-12-13 DIAGNOSIS — R41841 Cognitive communication deficit: Secondary | ICD-10-CM | POA: Diagnosis not present

## 2020-12-13 DIAGNOSIS — H40011 Open angle with borderline findings, low risk, right eye: Secondary | ICD-10-CM | POA: Diagnosis not present

## 2020-12-13 DIAGNOSIS — R2681 Unsteadiness on feet: Secondary | ICD-10-CM | POA: Diagnosis not present

## 2020-12-13 DIAGNOSIS — R2689 Other abnormalities of gait and mobility: Secondary | ICD-10-CM | POA: Diagnosis not present

## 2020-12-13 DIAGNOSIS — R1312 Dysphagia, oropharyngeal phase: Secondary | ICD-10-CM | POA: Diagnosis not present

## 2020-12-13 DIAGNOSIS — G9341 Metabolic encephalopathy: Secondary | ICD-10-CM | POA: Diagnosis not present

## 2020-12-14 DIAGNOSIS — R1312 Dysphagia, oropharyngeal phase: Secondary | ICD-10-CM | POA: Diagnosis not present

## 2020-12-14 DIAGNOSIS — R41841 Cognitive communication deficit: Secondary | ICD-10-CM | POA: Diagnosis not present

## 2020-12-14 DIAGNOSIS — G9341 Metabolic encephalopathy: Secondary | ICD-10-CM | POA: Diagnosis not present

## 2020-12-14 DIAGNOSIS — R2689 Other abnormalities of gait and mobility: Secondary | ICD-10-CM | POA: Diagnosis not present

## 2020-12-14 DIAGNOSIS — M6281 Muscle weakness (generalized): Secondary | ICD-10-CM | POA: Diagnosis not present

## 2020-12-14 DIAGNOSIS — R2681 Unsteadiness on feet: Secondary | ICD-10-CM | POA: Diagnosis not present

## 2020-12-19 DIAGNOSIS — R2689 Other abnormalities of gait and mobility: Secondary | ICD-10-CM | POA: Diagnosis not present

## 2020-12-19 DIAGNOSIS — F330 Major depressive disorder, recurrent, mild: Secondary | ICD-10-CM | POA: Diagnosis not present

## 2020-12-19 DIAGNOSIS — R2681 Unsteadiness on feet: Secondary | ICD-10-CM | POA: Diagnosis not present

## 2020-12-19 DIAGNOSIS — G9341 Metabolic encephalopathy: Secondary | ICD-10-CM | POA: Diagnosis not present

## 2020-12-19 DIAGNOSIS — F0390 Unspecified dementia without behavioral disturbance: Secondary | ICD-10-CM | POA: Diagnosis not present

## 2020-12-19 DIAGNOSIS — M6281 Muscle weakness (generalized): Secondary | ICD-10-CM | POA: Diagnosis not present

## 2020-12-19 DIAGNOSIS — R1312 Dysphagia, oropharyngeal phase: Secondary | ICD-10-CM | POA: Diagnosis not present

## 2020-12-19 DIAGNOSIS — R41841 Cognitive communication deficit: Secondary | ICD-10-CM | POA: Diagnosis not present

## 2020-12-20 DIAGNOSIS — R1312 Dysphagia, oropharyngeal phase: Secondary | ICD-10-CM | POA: Diagnosis not present

## 2020-12-20 DIAGNOSIS — R2689 Other abnormalities of gait and mobility: Secondary | ICD-10-CM | POA: Diagnosis not present

## 2020-12-20 DIAGNOSIS — R2681 Unsteadiness on feet: Secondary | ICD-10-CM | POA: Diagnosis not present

## 2020-12-20 DIAGNOSIS — M6281 Muscle weakness (generalized): Secondary | ICD-10-CM | POA: Diagnosis not present

## 2020-12-20 DIAGNOSIS — R41841 Cognitive communication deficit: Secondary | ICD-10-CM | POA: Diagnosis not present

## 2020-12-20 DIAGNOSIS — G9341 Metabolic encephalopathy: Secondary | ICD-10-CM | POA: Diagnosis not present

## 2020-12-22 DIAGNOSIS — G9341 Metabolic encephalopathy: Secondary | ICD-10-CM | POA: Diagnosis not present

## 2020-12-22 DIAGNOSIS — R41841 Cognitive communication deficit: Secondary | ICD-10-CM | POA: Diagnosis not present

## 2020-12-22 DIAGNOSIS — M6281 Muscle weakness (generalized): Secondary | ICD-10-CM | POA: Diagnosis not present

## 2020-12-22 DIAGNOSIS — R2689 Other abnormalities of gait and mobility: Secondary | ICD-10-CM | POA: Diagnosis not present

## 2020-12-22 DIAGNOSIS — R1312 Dysphagia, oropharyngeal phase: Secondary | ICD-10-CM | POA: Diagnosis not present

## 2020-12-22 DIAGNOSIS — R2681 Unsteadiness on feet: Secondary | ICD-10-CM | POA: Diagnosis not present

## 2020-12-27 DIAGNOSIS — R1312 Dysphagia, oropharyngeal phase: Secondary | ICD-10-CM | POA: Diagnosis not present

## 2020-12-27 DIAGNOSIS — G9341 Metabolic encephalopathy: Secondary | ICD-10-CM | POA: Diagnosis not present

## 2020-12-27 DIAGNOSIS — R2681 Unsteadiness on feet: Secondary | ICD-10-CM | POA: Diagnosis not present

## 2020-12-27 DIAGNOSIS — R41841 Cognitive communication deficit: Secondary | ICD-10-CM | POA: Diagnosis not present

## 2020-12-27 DIAGNOSIS — R2689 Other abnormalities of gait and mobility: Secondary | ICD-10-CM | POA: Diagnosis not present

## 2020-12-27 DIAGNOSIS — M6281 Muscle weakness (generalized): Secondary | ICD-10-CM | POA: Diagnosis not present

## 2020-12-28 DIAGNOSIS — G9341 Metabolic encephalopathy: Secondary | ICD-10-CM | POA: Diagnosis not present

## 2020-12-28 DIAGNOSIS — R2689 Other abnormalities of gait and mobility: Secondary | ICD-10-CM | POA: Diagnosis not present

## 2020-12-28 DIAGNOSIS — R1312 Dysphagia, oropharyngeal phase: Secondary | ICD-10-CM | POA: Diagnosis not present

## 2020-12-28 DIAGNOSIS — M6281 Muscle weakness (generalized): Secondary | ICD-10-CM | POA: Diagnosis not present

## 2020-12-28 DIAGNOSIS — R2681 Unsteadiness on feet: Secondary | ICD-10-CM | POA: Diagnosis not present

## 2020-12-28 DIAGNOSIS — R41841 Cognitive communication deficit: Secondary | ICD-10-CM | POA: Diagnosis not present

## 2020-12-29 DIAGNOSIS — R1312 Dysphagia, oropharyngeal phase: Secondary | ICD-10-CM | POA: Diagnosis not present

## 2020-12-29 DIAGNOSIS — M6281 Muscle weakness (generalized): Secondary | ICD-10-CM | POA: Diagnosis not present

## 2020-12-29 DIAGNOSIS — R2689 Other abnormalities of gait and mobility: Secondary | ICD-10-CM | POA: Diagnosis not present

## 2020-12-29 DIAGNOSIS — R41841 Cognitive communication deficit: Secondary | ICD-10-CM | POA: Diagnosis not present

## 2020-12-29 DIAGNOSIS — G9341 Metabolic encephalopathy: Secondary | ICD-10-CM | POA: Diagnosis not present

## 2020-12-29 DIAGNOSIS — R2681 Unsteadiness on feet: Secondary | ICD-10-CM | POA: Diagnosis not present

## 2021-01-02 DIAGNOSIS — F330 Major depressive disorder, recurrent, mild: Secondary | ICD-10-CM | POA: Diagnosis not present

## 2021-01-02 DIAGNOSIS — F03918 Unspecified dementia, unspecified severity, with other behavioral disturbance: Secondary | ICD-10-CM | POA: Diagnosis not present

## 2021-01-03 DIAGNOSIS — E1159 Type 2 diabetes mellitus with other circulatory complications: Secondary | ICD-10-CM | POA: Diagnosis not present

## 2021-01-03 DIAGNOSIS — F0390 Unspecified dementia without behavioral disturbance: Secondary | ICD-10-CM | POA: Diagnosis not present

## 2021-01-03 DIAGNOSIS — E785 Hyperlipidemia, unspecified: Secondary | ICD-10-CM | POA: Diagnosis not present

## 2021-01-03 DIAGNOSIS — I152 Hypertension secondary to endocrine disorders: Secondary | ICD-10-CM | POA: Diagnosis not present

## 2021-01-03 DIAGNOSIS — F32A Depression, unspecified: Secondary | ICD-10-CM | POA: Diagnosis not present

## 2021-01-03 DIAGNOSIS — D638 Anemia in other chronic diseases classified elsewhere: Secondary | ICD-10-CM | POA: Diagnosis not present

## 2021-01-05 DIAGNOSIS — R262 Difficulty in walking, not elsewhere classified: Secondary | ICD-10-CM | POA: Diagnosis not present

## 2021-01-05 DIAGNOSIS — Z9181 History of falling: Secondary | ICD-10-CM | POA: Diagnosis not present

## 2021-01-05 DIAGNOSIS — R2689 Other abnormalities of gait and mobility: Secondary | ICD-10-CM | POA: Diagnosis not present

## 2021-01-05 DIAGNOSIS — R2681 Unsteadiness on feet: Secondary | ICD-10-CM | POA: Diagnosis not present

## 2021-01-05 DIAGNOSIS — G9341 Metabolic encephalopathy: Secondary | ICD-10-CM | POA: Diagnosis not present

## 2021-01-05 DIAGNOSIS — I10 Essential (primary) hypertension: Secondary | ICD-10-CM | POA: Diagnosis not present

## 2021-01-05 DIAGNOSIS — G3183 Dementia with Lewy bodies: Secondary | ICD-10-CM | POA: Diagnosis not present

## 2021-01-05 DIAGNOSIS — F0390 Unspecified dementia without behavioral disturbance: Secondary | ICD-10-CM | POA: Diagnosis not present

## 2021-01-05 DIAGNOSIS — R41841 Cognitive communication deficit: Secondary | ICD-10-CM | POA: Diagnosis not present

## 2021-01-05 DIAGNOSIS — R1312 Dysphagia, oropharyngeal phase: Secondary | ICD-10-CM | POA: Diagnosis not present

## 2021-01-05 DIAGNOSIS — M6281 Muscle weakness (generalized): Secondary | ICD-10-CM | POA: Diagnosis not present

## 2021-01-06 DIAGNOSIS — D649 Anemia, unspecified: Secondary | ICD-10-CM | POA: Diagnosis not present

## 2021-01-06 DIAGNOSIS — R1312 Dysphagia, oropharyngeal phase: Secondary | ICD-10-CM | POA: Diagnosis not present

## 2021-01-06 DIAGNOSIS — E870 Hyperosmolality and hypernatremia: Secondary | ICD-10-CM | POA: Diagnosis not present

## 2021-01-06 DIAGNOSIS — R41841 Cognitive communication deficit: Secondary | ICD-10-CM | POA: Diagnosis not present

## 2021-01-06 DIAGNOSIS — G9341 Metabolic encephalopathy: Secondary | ICD-10-CM | POA: Diagnosis not present

## 2021-01-06 DIAGNOSIS — R2681 Unsteadiness on feet: Secondary | ICD-10-CM | POA: Diagnosis not present

## 2021-01-06 DIAGNOSIS — R2689 Other abnormalities of gait and mobility: Secondary | ICD-10-CM | POA: Diagnosis not present

## 2021-01-06 DIAGNOSIS — M6281 Muscle weakness (generalized): Secondary | ICD-10-CM | POA: Diagnosis not present

## 2021-01-06 DIAGNOSIS — E785 Hyperlipidemia, unspecified: Secondary | ICD-10-CM | POA: Diagnosis not present

## 2021-01-06 DIAGNOSIS — N183 Chronic kidney disease, stage 3 unspecified: Secondary | ICD-10-CM | POA: Diagnosis not present

## 2021-01-07 DIAGNOSIS — D508 Other iron deficiency anemias: Secondary | ICD-10-CM | POA: Diagnosis not present

## 2021-01-07 DIAGNOSIS — D518 Other vitamin B12 deficiency anemias: Secondary | ICD-10-CM | POA: Diagnosis not present

## 2021-01-07 DIAGNOSIS — I10 Essential (primary) hypertension: Secondary | ICD-10-CM | POA: Diagnosis not present

## 2021-01-09 DIAGNOSIS — M6281 Muscle weakness (generalized): Secondary | ICD-10-CM | POA: Diagnosis not present

## 2021-01-09 DIAGNOSIS — R1312 Dysphagia, oropharyngeal phase: Secondary | ICD-10-CM | POA: Diagnosis not present

## 2021-01-09 DIAGNOSIS — R2681 Unsteadiness on feet: Secondary | ICD-10-CM | POA: Diagnosis not present

## 2021-01-09 DIAGNOSIS — R41841 Cognitive communication deficit: Secondary | ICD-10-CM | POA: Diagnosis not present

## 2021-01-09 DIAGNOSIS — G9341 Metabolic encephalopathy: Secondary | ICD-10-CM | POA: Diagnosis not present

## 2021-01-09 DIAGNOSIS — R2689 Other abnormalities of gait and mobility: Secondary | ICD-10-CM | POA: Diagnosis not present

## 2021-01-10 DIAGNOSIS — R2681 Unsteadiness on feet: Secondary | ICD-10-CM | POA: Diagnosis not present

## 2021-01-10 DIAGNOSIS — R2689 Other abnormalities of gait and mobility: Secondary | ICD-10-CM | POA: Diagnosis not present

## 2021-01-10 DIAGNOSIS — R1312 Dysphagia, oropharyngeal phase: Secondary | ICD-10-CM | POA: Diagnosis not present

## 2021-01-10 DIAGNOSIS — M6281 Muscle weakness (generalized): Secondary | ICD-10-CM | POA: Diagnosis not present

## 2021-01-10 DIAGNOSIS — G9341 Metabolic encephalopathy: Secondary | ICD-10-CM | POA: Diagnosis not present

## 2021-01-10 DIAGNOSIS — R41841 Cognitive communication deficit: Secondary | ICD-10-CM | POA: Diagnosis not present

## 2021-01-11 DIAGNOSIS — R2689 Other abnormalities of gait and mobility: Secondary | ICD-10-CM | POA: Diagnosis not present

## 2021-01-11 DIAGNOSIS — R1312 Dysphagia, oropharyngeal phase: Secondary | ICD-10-CM | POA: Diagnosis not present

## 2021-01-11 DIAGNOSIS — G9341 Metabolic encephalopathy: Secondary | ICD-10-CM | POA: Diagnosis not present

## 2021-01-11 DIAGNOSIS — M6281 Muscle weakness (generalized): Secondary | ICD-10-CM | POA: Diagnosis not present

## 2021-01-11 DIAGNOSIS — R2681 Unsteadiness on feet: Secondary | ICD-10-CM | POA: Diagnosis not present

## 2021-01-11 DIAGNOSIS — R41841 Cognitive communication deficit: Secondary | ICD-10-CM | POA: Diagnosis not present

## 2021-01-30 DIAGNOSIS — R2689 Other abnormalities of gait and mobility: Secondary | ICD-10-CM | POA: Diagnosis not present

## 2021-01-30 DIAGNOSIS — M6281 Muscle weakness (generalized): Secondary | ICD-10-CM | POA: Diagnosis not present

## 2021-01-30 DIAGNOSIS — G9341 Metabolic encephalopathy: Secondary | ICD-10-CM | POA: Diagnosis not present

## 2021-01-30 DIAGNOSIS — R2681 Unsteadiness on feet: Secondary | ICD-10-CM | POA: Diagnosis not present

## 2021-01-30 DIAGNOSIS — R41841 Cognitive communication deficit: Secondary | ICD-10-CM | POA: Diagnosis not present

## 2021-01-30 DIAGNOSIS — F330 Major depressive disorder, recurrent, mild: Secondary | ICD-10-CM | POA: Diagnosis not present

## 2021-01-30 DIAGNOSIS — R1312 Dysphagia, oropharyngeal phase: Secondary | ICD-10-CM | POA: Diagnosis not present

## 2021-01-30 DIAGNOSIS — F03918 Unspecified dementia, unspecified severity, with other behavioral disturbance: Secondary | ICD-10-CM | POA: Diagnosis not present

## 2021-01-31 DIAGNOSIS — R2681 Unsteadiness on feet: Secondary | ICD-10-CM | POA: Diagnosis not present

## 2021-01-31 DIAGNOSIS — D649 Anemia, unspecified: Secondary | ICD-10-CM | POA: Diagnosis not present

## 2021-01-31 DIAGNOSIS — R41841 Cognitive communication deficit: Secondary | ICD-10-CM | POA: Diagnosis not present

## 2021-01-31 DIAGNOSIS — R1312 Dysphagia, oropharyngeal phase: Secondary | ICD-10-CM | POA: Diagnosis not present

## 2021-01-31 DIAGNOSIS — R2689 Other abnormalities of gait and mobility: Secondary | ICD-10-CM | POA: Diagnosis not present

## 2021-01-31 DIAGNOSIS — M6281 Muscle weakness (generalized): Secondary | ICD-10-CM | POA: Diagnosis not present

## 2021-01-31 DIAGNOSIS — E1159 Type 2 diabetes mellitus with other circulatory complications: Secondary | ICD-10-CM | POA: Diagnosis not present

## 2021-01-31 DIAGNOSIS — G9341 Metabolic encephalopathy: Secondary | ICD-10-CM | POA: Diagnosis not present

## 2021-01-31 DIAGNOSIS — E870 Hyperosmolality and hypernatremia: Secondary | ICD-10-CM | POA: Diagnosis not present

## 2021-02-01 DIAGNOSIS — R2681 Unsteadiness on feet: Secondary | ICD-10-CM | POA: Diagnosis not present

## 2021-02-01 DIAGNOSIS — R2689 Other abnormalities of gait and mobility: Secondary | ICD-10-CM | POA: Diagnosis not present

## 2021-02-01 DIAGNOSIS — G9341 Metabolic encephalopathy: Secondary | ICD-10-CM | POA: Diagnosis not present

## 2021-02-01 DIAGNOSIS — R41841 Cognitive communication deficit: Secondary | ICD-10-CM | POA: Diagnosis not present

## 2021-02-01 DIAGNOSIS — R1312 Dysphagia, oropharyngeal phase: Secondary | ICD-10-CM | POA: Diagnosis not present

## 2021-02-01 DIAGNOSIS — M6281 Muscle weakness (generalized): Secondary | ICD-10-CM | POA: Diagnosis not present

## 2021-02-02 DIAGNOSIS — R278 Other lack of coordination: Secondary | ICD-10-CM | POA: Diagnosis not present

## 2021-02-02 DIAGNOSIS — R1312 Dysphagia, oropharyngeal phase: Secondary | ICD-10-CM | POA: Diagnosis not present

## 2021-02-02 DIAGNOSIS — R2689 Other abnormalities of gait and mobility: Secondary | ICD-10-CM | POA: Diagnosis not present

## 2021-02-02 DIAGNOSIS — M6281 Muscle weakness (generalized): Secondary | ICD-10-CM | POA: Diagnosis not present

## 2021-02-02 DIAGNOSIS — F0390 Unspecified dementia without behavioral disturbance: Secondary | ICD-10-CM | POA: Diagnosis not present

## 2021-02-02 DIAGNOSIS — R2681 Unsteadiness on feet: Secondary | ICD-10-CM | POA: Diagnosis not present

## 2021-02-02 DIAGNOSIS — Z9181 History of falling: Secondary | ICD-10-CM | POA: Diagnosis not present

## 2021-02-02 DIAGNOSIS — R41841 Cognitive communication deficit: Secondary | ICD-10-CM | POA: Diagnosis not present

## 2021-02-02 DIAGNOSIS — R262 Difficulty in walking, not elsewhere classified: Secondary | ICD-10-CM | POA: Diagnosis not present

## 2021-02-02 DIAGNOSIS — G9341 Metabolic encephalopathy: Secondary | ICD-10-CM | POA: Diagnosis not present

## 2021-02-02 DIAGNOSIS — G3183 Dementia with Lewy bodies: Secondary | ICD-10-CM | POA: Diagnosis not present

## 2021-02-03 DIAGNOSIS — R1312 Dysphagia, oropharyngeal phase: Secondary | ICD-10-CM | POA: Diagnosis not present

## 2021-02-03 DIAGNOSIS — R2689 Other abnormalities of gait and mobility: Secondary | ICD-10-CM | POA: Diagnosis not present

## 2021-02-03 DIAGNOSIS — R41841 Cognitive communication deficit: Secondary | ICD-10-CM | POA: Diagnosis not present

## 2021-02-03 DIAGNOSIS — R278 Other lack of coordination: Secondary | ICD-10-CM | POA: Diagnosis not present

## 2021-02-03 DIAGNOSIS — G9341 Metabolic encephalopathy: Secondary | ICD-10-CM | POA: Diagnosis not present

## 2021-02-03 DIAGNOSIS — M6281 Muscle weakness (generalized): Secondary | ICD-10-CM | POA: Diagnosis not present

## 2021-02-04 DIAGNOSIS — R1312 Dysphagia, oropharyngeal phase: Secondary | ICD-10-CM | POA: Diagnosis not present

## 2021-02-04 DIAGNOSIS — R41841 Cognitive communication deficit: Secondary | ICD-10-CM | POA: Diagnosis not present

## 2021-02-04 DIAGNOSIS — R278 Other lack of coordination: Secondary | ICD-10-CM | POA: Diagnosis not present

## 2021-02-04 DIAGNOSIS — R2689 Other abnormalities of gait and mobility: Secondary | ICD-10-CM | POA: Diagnosis not present

## 2021-02-04 DIAGNOSIS — G9341 Metabolic encephalopathy: Secondary | ICD-10-CM | POA: Diagnosis not present

## 2021-02-04 DIAGNOSIS — M6281 Muscle weakness (generalized): Secondary | ICD-10-CM | POA: Diagnosis not present

## 2021-02-06 DIAGNOSIS — R278 Other lack of coordination: Secondary | ICD-10-CM | POA: Diagnosis not present

## 2021-02-06 DIAGNOSIS — M6281 Muscle weakness (generalized): Secondary | ICD-10-CM | POA: Diagnosis not present

## 2021-02-06 DIAGNOSIS — G9341 Metabolic encephalopathy: Secondary | ICD-10-CM | POA: Diagnosis not present

## 2021-02-06 DIAGNOSIS — R1312 Dysphagia, oropharyngeal phase: Secondary | ICD-10-CM | POA: Diagnosis not present

## 2021-02-06 DIAGNOSIS — R41841 Cognitive communication deficit: Secondary | ICD-10-CM | POA: Diagnosis not present

## 2021-02-06 DIAGNOSIS — E870 Hyperosmolality and hypernatremia: Secondary | ICD-10-CM | POA: Diagnosis not present

## 2021-02-06 DIAGNOSIS — R2689 Other abnormalities of gait and mobility: Secondary | ICD-10-CM | POA: Diagnosis not present

## 2021-02-07 DIAGNOSIS — R278 Other lack of coordination: Secondary | ICD-10-CM | POA: Diagnosis not present

## 2021-02-07 DIAGNOSIS — R2689 Other abnormalities of gait and mobility: Secondary | ICD-10-CM | POA: Diagnosis not present

## 2021-02-07 DIAGNOSIS — G9341 Metabolic encephalopathy: Secondary | ICD-10-CM | POA: Diagnosis not present

## 2021-02-07 DIAGNOSIS — R41841 Cognitive communication deficit: Secondary | ICD-10-CM | POA: Diagnosis not present

## 2021-02-07 DIAGNOSIS — R1312 Dysphagia, oropharyngeal phase: Secondary | ICD-10-CM | POA: Diagnosis not present

## 2021-02-07 DIAGNOSIS — M6281 Muscle weakness (generalized): Secondary | ICD-10-CM | POA: Diagnosis not present

## 2021-02-08 DIAGNOSIS — N183 Chronic kidney disease, stage 3 unspecified: Secondary | ICD-10-CM | POA: Diagnosis not present

## 2021-02-08 DIAGNOSIS — E870 Hyperosmolality and hypernatremia: Secondary | ICD-10-CM | POA: Diagnosis not present

## 2021-02-08 DIAGNOSIS — M6281 Muscle weakness (generalized): Secondary | ICD-10-CM | POA: Diagnosis not present

## 2021-02-08 DIAGNOSIS — R278 Other lack of coordination: Secondary | ICD-10-CM | POA: Diagnosis not present

## 2021-02-08 DIAGNOSIS — R41841 Cognitive communication deficit: Secondary | ICD-10-CM | POA: Diagnosis not present

## 2021-02-08 DIAGNOSIS — R1312 Dysphagia, oropharyngeal phase: Secondary | ICD-10-CM | POA: Diagnosis not present

## 2021-02-08 DIAGNOSIS — R2689 Other abnormalities of gait and mobility: Secondary | ICD-10-CM | POA: Diagnosis not present

## 2021-02-08 DIAGNOSIS — E860 Dehydration: Secondary | ICD-10-CM | POA: Diagnosis not present

## 2021-02-08 DIAGNOSIS — G9341 Metabolic encephalopathy: Secondary | ICD-10-CM | POA: Diagnosis not present

## 2021-02-08 DIAGNOSIS — F0390 Unspecified dementia without behavioral disturbance: Secondary | ICD-10-CM | POA: Diagnosis not present

## 2021-02-09 DIAGNOSIS — M6281 Muscle weakness (generalized): Secondary | ICD-10-CM | POA: Diagnosis not present

## 2021-02-09 DIAGNOSIS — R278 Other lack of coordination: Secondary | ICD-10-CM | POA: Diagnosis not present

## 2021-02-09 DIAGNOSIS — R2689 Other abnormalities of gait and mobility: Secondary | ICD-10-CM | POA: Diagnosis not present

## 2021-02-09 DIAGNOSIS — R1312 Dysphagia, oropharyngeal phase: Secondary | ICD-10-CM | POA: Diagnosis not present

## 2021-02-09 DIAGNOSIS — G9341 Metabolic encephalopathy: Secondary | ICD-10-CM | POA: Diagnosis not present

## 2021-02-09 DIAGNOSIS — R41841 Cognitive communication deficit: Secondary | ICD-10-CM | POA: Diagnosis not present

## 2021-02-10 DIAGNOSIS — G9341 Metabolic encephalopathy: Secondary | ICD-10-CM | POA: Diagnosis not present

## 2021-02-10 DIAGNOSIS — J99 Respiratory disorders in diseases classified elsewhere: Secondary | ICD-10-CM | POA: Diagnosis not present

## 2021-02-10 DIAGNOSIS — E870 Hyperosmolality and hypernatremia: Secondary | ICD-10-CM | POA: Diagnosis not present

## 2021-02-10 DIAGNOSIS — F0390 Unspecified dementia without behavioral disturbance: Secondary | ICD-10-CM | POA: Diagnosis not present

## 2021-02-10 DIAGNOSIS — R262 Difficulty in walking, not elsewhere classified: Secondary | ICD-10-CM | POA: Diagnosis not present

## 2021-02-10 DIAGNOSIS — N183 Chronic kidney disease, stage 3 unspecified: Secondary | ICD-10-CM | POA: Diagnosis not present

## 2021-02-10 DIAGNOSIS — I739 Peripheral vascular disease, unspecified: Secondary | ICD-10-CM | POA: Diagnosis not present

## 2021-02-10 DIAGNOSIS — R2681 Unsteadiness on feet: Secondary | ICD-10-CM | POA: Diagnosis not present

## 2021-02-10 DIAGNOSIS — Z9181 History of falling: Secondary | ICD-10-CM | POA: Diagnosis not present

## 2021-02-10 DIAGNOSIS — E119 Type 2 diabetes mellitus without complications: Secondary | ICD-10-CM | POA: Diagnosis not present

## 2021-02-10 DIAGNOSIS — R1312 Dysphagia, oropharyngeal phase: Secondary | ICD-10-CM | POA: Diagnosis not present

## 2021-02-10 DIAGNOSIS — Z8673 Personal history of transient ischemic attack (TIA), and cerebral infarction without residual deficits: Secondary | ICD-10-CM | POA: Diagnosis not present

## 2021-02-10 DIAGNOSIS — R2689 Other abnormalities of gait and mobility: Secondary | ICD-10-CM | POA: Diagnosis not present

## 2021-02-10 DIAGNOSIS — M6259 Muscle wasting and atrophy, not elsewhere classified, multiple sites: Secondary | ICD-10-CM | POA: Diagnosis not present

## 2021-02-10 DIAGNOSIS — R41841 Cognitive communication deficit: Secondary | ICD-10-CM | POA: Diagnosis not present

## 2021-02-10 DIAGNOSIS — M6281 Muscle weakness (generalized): Secondary | ICD-10-CM | POA: Diagnosis not present

## 2021-02-10 DIAGNOSIS — U071 COVID-19: Secondary | ICD-10-CM | POA: Diagnosis not present

## 2021-02-10 DIAGNOSIS — G3183 Dementia with Lewy bodies: Secondary | ICD-10-CM | POA: Diagnosis not present

## 2021-02-10 DIAGNOSIS — I10 Essential (primary) hypertension: Secondary | ICD-10-CM | POA: Diagnosis not present

## 2021-02-10 DIAGNOSIS — E1159 Type 2 diabetes mellitus with other circulatory complications: Secondary | ICD-10-CM | POA: Diagnosis not present

## 2021-02-10 DIAGNOSIS — B9781 Human metapneumovirus as the cause of diseases classified elsewhere: Secondary | ICD-10-CM | POA: Diagnosis not present

## 2021-02-10 DIAGNOSIS — M3581 Multisystem inflammatory syndrome: Secondary | ICD-10-CM | POA: Diagnosis not present

## 2021-02-13 DIAGNOSIS — U071 COVID-19: Secondary | ICD-10-CM | POA: Diagnosis not present

## 2021-02-13 DIAGNOSIS — E870 Hyperosmolality and hypernatremia: Secondary | ICD-10-CM | POA: Diagnosis not present

## 2021-02-13 DIAGNOSIS — E1159 Type 2 diabetes mellitus with other circulatory complications: Secondary | ICD-10-CM | POA: Diagnosis not present

## 2021-02-28 DIAGNOSIS — M6281 Muscle weakness (generalized): Secondary | ICD-10-CM | POA: Diagnosis not present

## 2021-02-28 DIAGNOSIS — I10 Essential (primary) hypertension: Secondary | ICD-10-CM | POA: Diagnosis not present

## 2021-03-02 DIAGNOSIS — N183 Chronic kidney disease, stage 3 unspecified: Secondary | ICD-10-CM | POA: Diagnosis not present

## 2021-03-02 DIAGNOSIS — Z794 Long term (current) use of insulin: Secondary | ICD-10-CM | POA: Diagnosis not present

## 2021-03-02 DIAGNOSIS — G903 Multi-system degeneration of the autonomic nervous system: Secondary | ICD-10-CM | POA: Diagnosis not present

## 2021-03-02 DIAGNOSIS — Z8616 Personal history of COVID-19: Secondary | ICD-10-CM | POA: Diagnosis not present

## 2021-03-02 DIAGNOSIS — E1159 Type 2 diabetes mellitus with other circulatory complications: Secondary | ICD-10-CM | POA: Diagnosis not present

## 2021-03-02 DIAGNOSIS — I959 Hypotension, unspecified: Secondary | ICD-10-CM | POA: Diagnosis not present

## 2021-03-02 DIAGNOSIS — F0390 Unspecified dementia without behavioral disturbance: Secondary | ICD-10-CM | POA: Diagnosis not present

## 2021-03-02 DIAGNOSIS — I739 Peripheral vascular disease, unspecified: Secondary | ICD-10-CM | POA: Diagnosis not present

## 2021-03-02 DIAGNOSIS — I152 Hypertension secondary to endocrine disorders: Secondary | ICD-10-CM | POA: Diagnosis not present

## 2021-03-02 DIAGNOSIS — R5381 Other malaise: Secondary | ICD-10-CM | POA: Diagnosis not present

## 2021-03-02 DIAGNOSIS — D649 Anemia, unspecified: Secondary | ICD-10-CM | POA: Diagnosis not present

## 2021-03-04 DIAGNOSIS — I10 Essential (primary) hypertension: Secondary | ICD-10-CM | POA: Diagnosis not present

## 2021-03-13 DIAGNOSIS — R2681 Unsteadiness on feet: Secondary | ICD-10-CM | POA: Diagnosis not present

## 2021-03-13 DIAGNOSIS — R262 Difficulty in walking, not elsewhere classified: Secondary | ICD-10-CM | POA: Diagnosis not present

## 2021-03-13 DIAGNOSIS — Z9181 History of falling: Secondary | ICD-10-CM | POA: Diagnosis not present

## 2021-03-13 DIAGNOSIS — G3183 Dementia with Lewy bodies: Secondary | ICD-10-CM | POA: Diagnosis not present

## 2021-03-13 DIAGNOSIS — M6281 Muscle weakness (generalized): Secondary | ICD-10-CM | POA: Diagnosis not present

## 2021-03-13 DIAGNOSIS — R278 Other lack of coordination: Secondary | ICD-10-CM | POA: Diagnosis not present

## 2021-03-13 DIAGNOSIS — R41841 Cognitive communication deficit: Secondary | ICD-10-CM | POA: Diagnosis not present

## 2021-03-13 DIAGNOSIS — F0390 Unspecified dementia without behavioral disturbance: Secondary | ICD-10-CM | POA: Diagnosis not present

## 2021-03-13 DIAGNOSIS — R1312 Dysphagia, oropharyngeal phase: Secondary | ICD-10-CM | POA: Diagnosis not present

## 2021-03-13 DIAGNOSIS — G9341 Metabolic encephalopathy: Secondary | ICD-10-CM | POA: Diagnosis not present

## 2021-03-13 DIAGNOSIS — R2689 Other abnormalities of gait and mobility: Secondary | ICD-10-CM | POA: Diagnosis not present

## 2021-03-14 DIAGNOSIS — G9341 Metabolic encephalopathy: Secondary | ICD-10-CM | POA: Diagnosis not present

## 2021-03-14 DIAGNOSIS — R278 Other lack of coordination: Secondary | ICD-10-CM | POA: Diagnosis not present

## 2021-03-14 DIAGNOSIS — D518 Other vitamin B12 deficiency anemias: Secondary | ICD-10-CM | POA: Diagnosis not present

## 2021-03-14 DIAGNOSIS — D509 Iron deficiency anemia, unspecified: Secondary | ICD-10-CM | POA: Diagnosis not present

## 2021-03-14 DIAGNOSIS — R1312 Dysphagia, oropharyngeal phase: Secondary | ICD-10-CM | POA: Diagnosis not present

## 2021-03-14 DIAGNOSIS — D529 Folate deficiency anemia, unspecified: Secondary | ICD-10-CM | POA: Diagnosis not present

## 2021-03-14 DIAGNOSIS — M6281 Muscle weakness (generalized): Secondary | ICD-10-CM | POA: Diagnosis not present

## 2021-03-14 DIAGNOSIS — R2689 Other abnormalities of gait and mobility: Secondary | ICD-10-CM | POA: Diagnosis not present

## 2021-03-14 DIAGNOSIS — R41841 Cognitive communication deficit: Secondary | ICD-10-CM | POA: Diagnosis not present

## 2021-03-15 DIAGNOSIS — D528 Other folate deficiency anemias: Secondary | ICD-10-CM | POA: Diagnosis not present

## 2021-03-15 DIAGNOSIS — D464 Refractory anemia, unspecified: Secondary | ICD-10-CM | POA: Diagnosis not present

## 2021-03-15 DIAGNOSIS — R278 Other lack of coordination: Secondary | ICD-10-CM | POA: Diagnosis not present

## 2021-03-15 DIAGNOSIS — R41841 Cognitive communication deficit: Secondary | ICD-10-CM | POA: Diagnosis not present

## 2021-03-15 DIAGNOSIS — M6281 Muscle weakness (generalized): Secondary | ICD-10-CM | POA: Diagnosis not present

## 2021-03-15 DIAGNOSIS — R1312 Dysphagia, oropharyngeal phase: Secondary | ICD-10-CM | POA: Diagnosis not present

## 2021-03-15 DIAGNOSIS — G9341 Metabolic encephalopathy: Secondary | ICD-10-CM | POA: Diagnosis not present

## 2021-03-15 DIAGNOSIS — R2689 Other abnormalities of gait and mobility: Secondary | ICD-10-CM | POA: Diagnosis not present

## 2021-03-16 DIAGNOSIS — R2689 Other abnormalities of gait and mobility: Secondary | ICD-10-CM | POA: Diagnosis not present

## 2021-03-16 DIAGNOSIS — R278 Other lack of coordination: Secondary | ICD-10-CM | POA: Diagnosis not present

## 2021-03-16 DIAGNOSIS — R41841 Cognitive communication deficit: Secondary | ICD-10-CM | POA: Diagnosis not present

## 2021-03-16 DIAGNOSIS — R1312 Dysphagia, oropharyngeal phase: Secondary | ICD-10-CM | POA: Diagnosis not present

## 2021-03-16 DIAGNOSIS — M6281 Muscle weakness (generalized): Secondary | ICD-10-CM | POA: Diagnosis not present

## 2021-03-16 DIAGNOSIS — G9341 Metabolic encephalopathy: Secondary | ICD-10-CM | POA: Diagnosis not present

## 2021-03-17 DIAGNOSIS — R1312 Dysphagia, oropharyngeal phase: Secondary | ICD-10-CM | POA: Diagnosis not present

## 2021-03-17 DIAGNOSIS — G9341 Metabolic encephalopathy: Secondary | ICD-10-CM | POA: Diagnosis not present

## 2021-03-17 DIAGNOSIS — R278 Other lack of coordination: Secondary | ICD-10-CM | POA: Diagnosis not present

## 2021-03-17 DIAGNOSIS — R2689 Other abnormalities of gait and mobility: Secondary | ICD-10-CM | POA: Diagnosis not present

## 2021-03-17 DIAGNOSIS — M6281 Muscle weakness (generalized): Secondary | ICD-10-CM | POA: Diagnosis not present

## 2021-03-17 DIAGNOSIS — R41841 Cognitive communication deficit: Secondary | ICD-10-CM | POA: Diagnosis not present

## 2021-03-20 DIAGNOSIS — M6281 Muscle weakness (generalized): Secondary | ICD-10-CM | POA: Diagnosis not present

## 2021-03-20 DIAGNOSIS — R1312 Dysphagia, oropharyngeal phase: Secondary | ICD-10-CM | POA: Diagnosis not present

## 2021-03-20 DIAGNOSIS — R2689 Other abnormalities of gait and mobility: Secondary | ICD-10-CM | POA: Diagnosis not present

## 2021-03-20 DIAGNOSIS — G9341 Metabolic encephalopathy: Secondary | ICD-10-CM | POA: Diagnosis not present

## 2021-03-20 DIAGNOSIS — R41841 Cognitive communication deficit: Secondary | ICD-10-CM | POA: Diagnosis not present

## 2021-03-20 DIAGNOSIS — R278 Other lack of coordination: Secondary | ICD-10-CM | POA: Diagnosis not present

## 2021-03-21 DIAGNOSIS — G9341 Metabolic encephalopathy: Secondary | ICD-10-CM | POA: Diagnosis not present

## 2021-03-21 DIAGNOSIS — R1312 Dysphagia, oropharyngeal phase: Secondary | ICD-10-CM | POA: Diagnosis not present

## 2021-03-21 DIAGNOSIS — R278 Other lack of coordination: Secondary | ICD-10-CM | POA: Diagnosis not present

## 2021-03-21 DIAGNOSIS — R41841 Cognitive communication deficit: Secondary | ICD-10-CM | POA: Diagnosis not present

## 2021-03-21 DIAGNOSIS — M6281 Muscle weakness (generalized): Secondary | ICD-10-CM | POA: Diagnosis not present

## 2021-03-21 DIAGNOSIS — R2689 Other abnormalities of gait and mobility: Secondary | ICD-10-CM | POA: Diagnosis not present

## 2021-03-22 DIAGNOSIS — R278 Other lack of coordination: Secondary | ICD-10-CM | POA: Diagnosis not present

## 2021-03-22 DIAGNOSIS — Z0189 Encounter for other specified special examinations: Secondary | ICD-10-CM | POA: Diagnosis not present

## 2021-03-22 DIAGNOSIS — G903 Multi-system degeneration of the autonomic nervous system: Secondary | ICD-10-CM | POA: Diagnosis not present

## 2021-03-22 DIAGNOSIS — R5383 Other fatigue: Secondary | ICD-10-CM | POA: Diagnosis not present

## 2021-03-22 DIAGNOSIS — E870 Hyperosmolality and hypernatremia: Secondary | ICD-10-CM | POA: Diagnosis not present

## 2021-03-22 DIAGNOSIS — M6281 Muscle weakness (generalized): Secondary | ICD-10-CM | POA: Diagnosis not present

## 2021-03-22 DIAGNOSIS — G9341 Metabolic encephalopathy: Secondary | ICD-10-CM | POA: Diagnosis not present

## 2021-03-22 DIAGNOSIS — R2689 Other abnormalities of gait and mobility: Secondary | ICD-10-CM | POA: Diagnosis not present

## 2021-03-22 DIAGNOSIS — R1312 Dysphagia, oropharyngeal phase: Secondary | ICD-10-CM | POA: Diagnosis not present

## 2021-03-22 DIAGNOSIS — D649 Anemia, unspecified: Secondary | ICD-10-CM | POA: Diagnosis not present

## 2021-03-22 DIAGNOSIS — R41841 Cognitive communication deficit: Secondary | ICD-10-CM | POA: Diagnosis not present

## 2021-03-23 DIAGNOSIS — R278 Other lack of coordination: Secondary | ICD-10-CM | POA: Diagnosis not present

## 2021-03-23 DIAGNOSIS — R2689 Other abnormalities of gait and mobility: Secondary | ICD-10-CM | POA: Diagnosis not present

## 2021-03-23 DIAGNOSIS — M6281 Muscle weakness (generalized): Secondary | ICD-10-CM | POA: Diagnosis not present

## 2021-03-23 DIAGNOSIS — R1312 Dysphagia, oropharyngeal phase: Secondary | ICD-10-CM | POA: Diagnosis not present

## 2021-03-23 DIAGNOSIS — G9341 Metabolic encephalopathy: Secondary | ICD-10-CM | POA: Diagnosis not present

## 2021-03-23 DIAGNOSIS — R41841 Cognitive communication deficit: Secondary | ICD-10-CM | POA: Diagnosis not present

## 2021-03-24 DIAGNOSIS — G9341 Metabolic encephalopathy: Secondary | ICD-10-CM | POA: Diagnosis not present

## 2021-03-24 DIAGNOSIS — M6281 Muscle weakness (generalized): Secondary | ICD-10-CM | POA: Diagnosis not present

## 2021-03-24 DIAGNOSIS — R41841 Cognitive communication deficit: Secondary | ICD-10-CM | POA: Diagnosis not present

## 2021-03-24 DIAGNOSIS — R278 Other lack of coordination: Secondary | ICD-10-CM | POA: Diagnosis not present

## 2021-03-24 DIAGNOSIS — R1312 Dysphagia, oropharyngeal phase: Secondary | ICD-10-CM | POA: Diagnosis not present

## 2021-03-24 DIAGNOSIS — R2689 Other abnormalities of gait and mobility: Secondary | ICD-10-CM | POA: Diagnosis not present

## 2021-03-27 DIAGNOSIS — R1312 Dysphagia, oropharyngeal phase: Secondary | ICD-10-CM | POA: Diagnosis not present

## 2021-03-27 DIAGNOSIS — G9341 Metabolic encephalopathy: Secondary | ICD-10-CM | POA: Diagnosis not present

## 2021-03-27 DIAGNOSIS — R2689 Other abnormalities of gait and mobility: Secondary | ICD-10-CM | POA: Diagnosis not present

## 2021-03-27 DIAGNOSIS — R41841 Cognitive communication deficit: Secondary | ICD-10-CM | POA: Diagnosis not present

## 2021-03-27 DIAGNOSIS — M6281 Muscle weakness (generalized): Secondary | ICD-10-CM | POA: Diagnosis not present

## 2021-03-27 DIAGNOSIS — R278 Other lack of coordination: Secondary | ICD-10-CM | POA: Diagnosis not present

## 2021-03-28 DIAGNOSIS — R2689 Other abnormalities of gait and mobility: Secondary | ICD-10-CM | POA: Diagnosis not present

## 2021-03-28 DIAGNOSIS — G9341 Metabolic encephalopathy: Secondary | ICD-10-CM | POA: Diagnosis not present

## 2021-03-28 DIAGNOSIS — M6281 Muscle weakness (generalized): Secondary | ICD-10-CM | POA: Diagnosis not present

## 2021-03-28 DIAGNOSIS — R1312 Dysphagia, oropharyngeal phase: Secondary | ICD-10-CM | POA: Diagnosis not present

## 2021-03-28 DIAGNOSIS — R278 Other lack of coordination: Secondary | ICD-10-CM | POA: Diagnosis not present

## 2021-03-28 DIAGNOSIS — R41841 Cognitive communication deficit: Secondary | ICD-10-CM | POA: Diagnosis not present

## 2021-04-10 DIAGNOSIS — D649 Anemia, unspecified: Secondary | ICD-10-CM | POA: Diagnosis not present

## 2021-04-10 DIAGNOSIS — R55 Syncope and collapse: Secondary | ICD-10-CM | POA: Diagnosis not present

## 2021-04-10 DIAGNOSIS — G903 Multi-system degeneration of the autonomic nervous system: Secondary | ICD-10-CM | POA: Diagnosis not present

## 2021-04-10 DIAGNOSIS — I679 Cerebrovascular disease, unspecified: Secondary | ICD-10-CM | POA: Diagnosis not present

## 2021-04-10 DIAGNOSIS — E1159 Type 2 diabetes mellitus with other circulatory complications: Secondary | ICD-10-CM | POA: Diagnosis not present

## 2021-04-13 DIAGNOSIS — I152 Hypertension secondary to endocrine disorders: Secondary | ICD-10-CM | POA: Diagnosis not present

## 2021-04-13 DIAGNOSIS — F0390 Unspecified dementia without behavioral disturbance: Secondary | ICD-10-CM | POA: Diagnosis not present

## 2021-04-13 DIAGNOSIS — R300 Dysuria: Secondary | ICD-10-CM | POA: Diagnosis not present

## 2021-04-17 DIAGNOSIS — E1159 Type 2 diabetes mellitus with other circulatory complications: Secondary | ICD-10-CM | POA: Diagnosis not present

## 2021-04-17 DIAGNOSIS — G903 Multi-system degeneration of the autonomic nervous system: Secondary | ICD-10-CM | POA: Diagnosis not present

## 2021-04-17 DIAGNOSIS — F0390 Unspecified dementia without behavioral disturbance: Secondary | ICD-10-CM | POA: Diagnosis not present

## 2021-04-17 DIAGNOSIS — W19XXXA Unspecified fall, initial encounter: Secondary | ICD-10-CM | POA: Diagnosis not present

## 2021-04-21 DIAGNOSIS — R0989 Other specified symptoms and signs involving the circulatory and respiratory systems: Secondary | ICD-10-CM | POA: Diagnosis not present

## 2021-04-22 DIAGNOSIS — R293 Abnormal posture: Secondary | ICD-10-CM | POA: Diagnosis not present

## 2021-04-22 DIAGNOSIS — M6259 Muscle wasting and atrophy, not elsewhere classified, multiple sites: Secondary | ICD-10-CM | POA: Diagnosis not present

## 2021-04-22 DIAGNOSIS — M6281 Muscle weakness (generalized): Secondary | ICD-10-CM | POA: Diagnosis not present

## 2021-04-25 DIAGNOSIS — R293 Abnormal posture: Secondary | ICD-10-CM | POA: Diagnosis not present

## 2021-04-25 DIAGNOSIS — M6281 Muscle weakness (generalized): Secondary | ICD-10-CM | POA: Diagnosis not present

## 2021-04-25 DIAGNOSIS — E119 Type 2 diabetes mellitus without complications: Secondary | ICD-10-CM | POA: Diagnosis not present

## 2021-04-25 DIAGNOSIS — M6259 Muscle wasting and atrophy, not elsewhere classified, multiple sites: Secondary | ICD-10-CM | POA: Diagnosis not present

## 2021-04-26 DIAGNOSIS — R293 Abnormal posture: Secondary | ICD-10-CM | POA: Diagnosis not present

## 2021-04-26 DIAGNOSIS — M6259 Muscle wasting and atrophy, not elsewhere classified, multiple sites: Secondary | ICD-10-CM | POA: Diagnosis not present

## 2021-04-26 DIAGNOSIS — M6281 Muscle weakness (generalized): Secondary | ICD-10-CM | POA: Diagnosis not present

## 2021-04-27 DIAGNOSIS — E1151 Type 2 diabetes mellitus with diabetic peripheral angiopathy without gangrene: Secondary | ICD-10-CM | POA: Diagnosis not present

## 2021-04-27 DIAGNOSIS — L603 Nail dystrophy: Secondary | ICD-10-CM | POA: Diagnosis not present

## 2021-04-27 DIAGNOSIS — Z794 Long term (current) use of insulin: Secondary | ICD-10-CM | POA: Diagnosis not present

## 2021-04-28 DIAGNOSIS — R293 Abnormal posture: Secondary | ICD-10-CM | POA: Diagnosis not present

## 2021-04-28 DIAGNOSIS — M6281 Muscle weakness (generalized): Secondary | ICD-10-CM | POA: Diagnosis not present

## 2021-04-28 DIAGNOSIS — M6259 Muscle wasting and atrophy, not elsewhere classified, multiple sites: Secondary | ICD-10-CM | POA: Diagnosis not present

## 2021-05-01 DIAGNOSIS — R293 Abnormal posture: Secondary | ICD-10-CM | POA: Diagnosis not present

## 2021-05-01 DIAGNOSIS — M6259 Muscle wasting and atrophy, not elsewhere classified, multiple sites: Secondary | ICD-10-CM | POA: Diagnosis not present

## 2021-05-01 DIAGNOSIS — F330 Major depressive disorder, recurrent, mild: Secondary | ICD-10-CM | POA: Diagnosis not present

## 2021-05-01 DIAGNOSIS — M6281 Muscle weakness (generalized): Secondary | ICD-10-CM | POA: Diagnosis not present

## 2021-05-01 DIAGNOSIS — F03918 Unspecified dementia, unspecified severity, with other behavioral disturbance: Secondary | ICD-10-CM | POA: Diagnosis not present

## 2021-05-02 DIAGNOSIS — R293 Abnormal posture: Secondary | ICD-10-CM | POA: Diagnosis not present

## 2021-05-02 DIAGNOSIS — M6259 Muscle wasting and atrophy, not elsewhere classified, multiple sites: Secondary | ICD-10-CM | POA: Diagnosis not present

## 2021-05-02 DIAGNOSIS — M6281 Muscle weakness (generalized): Secondary | ICD-10-CM | POA: Diagnosis not present

## 2021-05-03 DIAGNOSIS — M6259 Muscle wasting and atrophy, not elsewhere classified, multiple sites: Secondary | ICD-10-CM | POA: Diagnosis not present

## 2021-05-03 DIAGNOSIS — R293 Abnormal posture: Secondary | ICD-10-CM | POA: Diagnosis not present

## 2021-05-03 DIAGNOSIS — M6281 Muscle weakness (generalized): Secondary | ICD-10-CM | POA: Diagnosis not present

## 2021-05-03 DIAGNOSIS — R1312 Dysphagia, oropharyngeal phase: Secondary | ICD-10-CM | POA: Diagnosis not present

## 2021-05-04 DIAGNOSIS — I152 Hypertension secondary to endocrine disorders: Secondary | ICD-10-CM | POA: Diagnosis not present

## 2021-05-04 DIAGNOSIS — M6281 Muscle weakness (generalized): Secondary | ICD-10-CM | POA: Diagnosis not present

## 2021-05-04 DIAGNOSIS — D649 Anemia, unspecified: Secondary | ICD-10-CM | POA: Diagnosis not present

## 2021-05-04 DIAGNOSIS — R293 Abnormal posture: Secondary | ICD-10-CM | POA: Diagnosis not present

## 2021-05-04 DIAGNOSIS — M6259 Muscle wasting and atrophy, not elsewhere classified, multiple sites: Secondary | ICD-10-CM | POA: Diagnosis not present

## 2021-05-04 DIAGNOSIS — E1159 Type 2 diabetes mellitus with other circulatory complications: Secondary | ICD-10-CM | POA: Diagnosis not present

## 2021-05-04 DIAGNOSIS — R1312 Dysphagia, oropharyngeal phase: Secondary | ICD-10-CM | POA: Diagnosis not present

## 2021-05-04 DIAGNOSIS — F0390 Unspecified dementia without behavioral disturbance: Secondary | ICD-10-CM | POA: Diagnosis not present

## 2021-05-05 DIAGNOSIS — R293 Abnormal posture: Secondary | ICD-10-CM | POA: Diagnosis not present

## 2021-05-05 DIAGNOSIS — M6281 Muscle weakness (generalized): Secondary | ICD-10-CM | POA: Diagnosis not present

## 2021-05-05 DIAGNOSIS — R1312 Dysphagia, oropharyngeal phase: Secondary | ICD-10-CM | POA: Diagnosis not present

## 2021-05-05 DIAGNOSIS — M6259 Muscle wasting and atrophy, not elsewhere classified, multiple sites: Secondary | ICD-10-CM | POA: Diagnosis not present

## 2021-05-06 DIAGNOSIS — M6259 Muscle wasting and atrophy, not elsewhere classified, multiple sites: Secondary | ICD-10-CM | POA: Diagnosis not present

## 2021-05-06 DIAGNOSIS — M6281 Muscle weakness (generalized): Secondary | ICD-10-CM | POA: Diagnosis not present

## 2021-05-06 DIAGNOSIS — R293 Abnormal posture: Secondary | ICD-10-CM | POA: Diagnosis not present

## 2021-05-06 DIAGNOSIS — R1312 Dysphagia, oropharyngeal phase: Secondary | ICD-10-CM | POA: Diagnosis not present

## 2021-05-07 DIAGNOSIS — R293 Abnormal posture: Secondary | ICD-10-CM | POA: Diagnosis not present

## 2021-05-07 DIAGNOSIS — M6281 Muscle weakness (generalized): Secondary | ICD-10-CM | POA: Diagnosis not present

## 2021-05-07 DIAGNOSIS — R1312 Dysphagia, oropharyngeal phase: Secondary | ICD-10-CM | POA: Diagnosis not present

## 2021-05-07 DIAGNOSIS — M6259 Muscle wasting and atrophy, not elsewhere classified, multiple sites: Secondary | ICD-10-CM | POA: Diagnosis not present

## 2021-05-08 DIAGNOSIS — M6281 Muscle weakness (generalized): Secondary | ICD-10-CM | POA: Diagnosis not present

## 2021-05-08 DIAGNOSIS — R1312 Dysphagia, oropharyngeal phase: Secondary | ICD-10-CM | POA: Diagnosis not present

## 2021-05-08 DIAGNOSIS — M6259 Muscle wasting and atrophy, not elsewhere classified, multiple sites: Secondary | ICD-10-CM | POA: Diagnosis not present

## 2021-05-08 DIAGNOSIS — R293 Abnormal posture: Secondary | ICD-10-CM | POA: Diagnosis not present

## 2021-05-09 DIAGNOSIS — M6259 Muscle wasting and atrophy, not elsewhere classified, multiple sites: Secondary | ICD-10-CM | POA: Diagnosis not present

## 2021-05-09 DIAGNOSIS — M6281 Muscle weakness (generalized): Secondary | ICD-10-CM | POA: Diagnosis not present

## 2021-05-09 DIAGNOSIS — R1312 Dysphagia, oropharyngeal phase: Secondary | ICD-10-CM | POA: Diagnosis not present

## 2021-05-09 DIAGNOSIS — R293 Abnormal posture: Secondary | ICD-10-CM | POA: Diagnosis not present

## 2021-05-10 DIAGNOSIS — R1312 Dysphagia, oropharyngeal phase: Secondary | ICD-10-CM | POA: Diagnosis not present

## 2021-05-10 DIAGNOSIS — M6259 Muscle wasting and atrophy, not elsewhere classified, multiple sites: Secondary | ICD-10-CM | POA: Diagnosis not present

## 2021-05-10 DIAGNOSIS — R293 Abnormal posture: Secondary | ICD-10-CM | POA: Diagnosis not present

## 2021-05-10 DIAGNOSIS — M6281 Muscle weakness (generalized): Secondary | ICD-10-CM | POA: Diagnosis not present

## 2021-06-01 DIAGNOSIS — R1312 Dysphagia, oropharyngeal phase: Secondary | ICD-10-CM | POA: Diagnosis not present

## 2021-06-01 DIAGNOSIS — M6259 Muscle wasting and atrophy, not elsewhere classified, multiple sites: Secondary | ICD-10-CM | POA: Diagnosis not present

## 2021-06-01 DIAGNOSIS — R293 Abnormal posture: Secondary | ICD-10-CM | POA: Diagnosis not present

## 2021-06-01 DIAGNOSIS — M6281 Muscle weakness (generalized): Secondary | ICD-10-CM | POA: Diagnosis not present

## 2021-06-03 DIAGNOSIS — M6259 Muscle wasting and atrophy, not elsewhere classified, multiple sites: Secondary | ICD-10-CM | POA: Diagnosis not present

## 2021-06-03 DIAGNOSIS — R1312 Dysphagia, oropharyngeal phase: Secondary | ICD-10-CM | POA: Diagnosis not present

## 2021-06-03 DIAGNOSIS — G9341 Metabolic encephalopathy: Secondary | ICD-10-CM | POA: Diagnosis not present

## 2021-06-05 DIAGNOSIS — R1312 Dysphagia, oropharyngeal phase: Secondary | ICD-10-CM | POA: Diagnosis not present

## 2021-06-05 DIAGNOSIS — M6259 Muscle wasting and atrophy, not elsewhere classified, multiple sites: Secondary | ICD-10-CM | POA: Diagnosis not present

## 2021-06-05 DIAGNOSIS — G9341 Metabolic encephalopathy: Secondary | ICD-10-CM | POA: Diagnosis not present

## 2021-06-06 DIAGNOSIS — M6259 Muscle wasting and atrophy, not elsewhere classified, multiple sites: Secondary | ICD-10-CM | POA: Diagnosis not present

## 2021-06-06 DIAGNOSIS — R1312 Dysphagia, oropharyngeal phase: Secondary | ICD-10-CM | POA: Diagnosis not present

## 2021-06-06 DIAGNOSIS — G9341 Metabolic encephalopathy: Secondary | ICD-10-CM | POA: Diagnosis not present

## 2021-06-07 DIAGNOSIS — G903 Multi-system degeneration of the autonomic nervous system: Secondary | ICD-10-CM | POA: Diagnosis not present

## 2021-06-07 DIAGNOSIS — I152 Hypertension secondary to endocrine disorders: Secondary | ICD-10-CM | POA: Diagnosis not present

## 2021-06-07 DIAGNOSIS — E1159 Type 2 diabetes mellitus with other circulatory complications: Secondary | ICD-10-CM | POA: Diagnosis not present

## 2021-06-08 DIAGNOSIS — M6259 Muscle wasting and atrophy, not elsewhere classified, multiple sites: Secondary | ICD-10-CM | POA: Diagnosis not present

## 2021-06-08 DIAGNOSIS — R1312 Dysphagia, oropharyngeal phase: Secondary | ICD-10-CM | POA: Diagnosis not present

## 2021-06-08 DIAGNOSIS — G9341 Metabolic encephalopathy: Secondary | ICD-10-CM | POA: Diagnosis not present

## 2021-06-09 DIAGNOSIS — R1312 Dysphagia, oropharyngeal phase: Secondary | ICD-10-CM | POA: Diagnosis not present

## 2021-06-09 DIAGNOSIS — M6259 Muscle wasting and atrophy, not elsewhere classified, multiple sites: Secondary | ICD-10-CM | POA: Diagnosis not present

## 2021-06-09 DIAGNOSIS — G9341 Metabolic encephalopathy: Secondary | ICD-10-CM | POA: Diagnosis not present

## 2021-06-12 DIAGNOSIS — G9341 Metabolic encephalopathy: Secondary | ICD-10-CM | POA: Diagnosis not present

## 2021-06-12 DIAGNOSIS — M6259 Muscle wasting and atrophy, not elsewhere classified, multiple sites: Secondary | ICD-10-CM | POA: Diagnosis not present

## 2021-06-12 DIAGNOSIS — R1312 Dysphagia, oropharyngeal phase: Secondary | ICD-10-CM | POA: Diagnosis not present

## 2021-06-14 DIAGNOSIS — G9341 Metabolic encephalopathy: Secondary | ICD-10-CM | POA: Diagnosis not present

## 2021-06-14 DIAGNOSIS — R1312 Dysphagia, oropharyngeal phase: Secondary | ICD-10-CM | POA: Diagnosis not present

## 2021-06-14 DIAGNOSIS — M6259 Muscle wasting and atrophy, not elsewhere classified, multiple sites: Secondary | ICD-10-CM | POA: Diagnosis not present

## 2021-06-16 DIAGNOSIS — G9341 Metabolic encephalopathy: Secondary | ICD-10-CM | POA: Diagnosis not present

## 2021-06-16 DIAGNOSIS — R1312 Dysphagia, oropharyngeal phase: Secondary | ICD-10-CM | POA: Diagnosis not present

## 2021-06-16 DIAGNOSIS — M6259 Muscle wasting and atrophy, not elsewhere classified, multiple sites: Secondary | ICD-10-CM | POA: Diagnosis not present

## 2021-06-17 DIAGNOSIS — R1312 Dysphagia, oropharyngeal phase: Secondary | ICD-10-CM | POA: Diagnosis not present

## 2021-06-17 DIAGNOSIS — M6259 Muscle wasting and atrophy, not elsewhere classified, multiple sites: Secondary | ICD-10-CM | POA: Diagnosis not present

## 2021-06-17 DIAGNOSIS — G9341 Metabolic encephalopathy: Secondary | ICD-10-CM | POA: Diagnosis not present

## 2021-06-20 DIAGNOSIS — M6259 Muscle wasting and atrophy, not elsewhere classified, multiple sites: Secondary | ICD-10-CM | POA: Diagnosis not present

## 2021-06-20 DIAGNOSIS — G9341 Metabolic encephalopathy: Secondary | ICD-10-CM | POA: Diagnosis not present

## 2021-06-20 DIAGNOSIS — R1312 Dysphagia, oropharyngeal phase: Secondary | ICD-10-CM | POA: Diagnosis not present

## 2021-06-21 DIAGNOSIS — M6259 Muscle wasting and atrophy, not elsewhere classified, multiple sites: Secondary | ICD-10-CM | POA: Diagnosis not present

## 2021-06-21 DIAGNOSIS — R1312 Dysphagia, oropharyngeal phase: Secondary | ICD-10-CM | POA: Diagnosis not present

## 2021-06-21 DIAGNOSIS — G9341 Metabolic encephalopathy: Secondary | ICD-10-CM | POA: Diagnosis not present

## 2021-06-22 DIAGNOSIS — M6259 Muscle wasting and atrophy, not elsewhere classified, multiple sites: Secondary | ICD-10-CM | POA: Diagnosis not present

## 2021-06-22 DIAGNOSIS — R1312 Dysphagia, oropharyngeal phase: Secondary | ICD-10-CM | POA: Diagnosis not present

## 2021-06-22 DIAGNOSIS — G9341 Metabolic encephalopathy: Secondary | ICD-10-CM | POA: Diagnosis not present

## 2021-06-23 DIAGNOSIS — G9341 Metabolic encephalopathy: Secondary | ICD-10-CM | POA: Diagnosis not present

## 2021-06-23 DIAGNOSIS — R1312 Dysphagia, oropharyngeal phase: Secondary | ICD-10-CM | POA: Diagnosis not present

## 2021-06-23 DIAGNOSIS — M6259 Muscle wasting and atrophy, not elsewhere classified, multiple sites: Secondary | ICD-10-CM | POA: Diagnosis not present

## 2021-06-26 DIAGNOSIS — G9341 Metabolic encephalopathy: Secondary | ICD-10-CM | POA: Diagnosis not present

## 2021-06-26 DIAGNOSIS — R197 Diarrhea, unspecified: Secondary | ICD-10-CM | POA: Diagnosis not present

## 2021-06-26 DIAGNOSIS — R5381 Other malaise: Secondary | ICD-10-CM | POA: Diagnosis not present

## 2021-06-26 DIAGNOSIS — R1110 Vomiting, unspecified: Secondary | ICD-10-CM | POA: Diagnosis not present

## 2021-06-26 DIAGNOSIS — E1159 Type 2 diabetes mellitus with other circulatory complications: Secondary | ICD-10-CM | POA: Diagnosis not present

## 2021-06-26 DIAGNOSIS — M6259 Muscle wasting and atrophy, not elsewhere classified, multiple sites: Secondary | ICD-10-CM | POA: Diagnosis not present

## 2021-06-26 DIAGNOSIS — R1312 Dysphagia, oropharyngeal phase: Secondary | ICD-10-CM | POA: Diagnosis not present

## 2021-06-27 DIAGNOSIS — M6259 Muscle wasting and atrophy, not elsewhere classified, multiple sites: Secondary | ICD-10-CM | POA: Diagnosis not present

## 2021-06-27 DIAGNOSIS — R1312 Dysphagia, oropharyngeal phase: Secondary | ICD-10-CM | POA: Diagnosis not present

## 2021-06-27 DIAGNOSIS — G9341 Metabolic encephalopathy: Secondary | ICD-10-CM | POA: Diagnosis not present

## 2021-06-28 DIAGNOSIS — R1312 Dysphagia, oropharyngeal phase: Secondary | ICD-10-CM | POA: Diagnosis not present

## 2021-06-28 DIAGNOSIS — M6259 Muscle wasting and atrophy, not elsewhere classified, multiple sites: Secondary | ICD-10-CM | POA: Diagnosis not present

## 2021-06-28 DIAGNOSIS — G9341 Metabolic encephalopathy: Secondary | ICD-10-CM | POA: Diagnosis not present

## 2021-06-28 DIAGNOSIS — I10 Essential (primary) hypertension: Secondary | ICD-10-CM | POA: Diagnosis not present

## 2021-06-29 DIAGNOSIS — M6259 Muscle wasting and atrophy, not elsewhere classified, multiple sites: Secondary | ICD-10-CM | POA: Diagnosis not present

## 2021-06-29 DIAGNOSIS — F0150 Vascular dementia without behavioral disturbance: Secondary | ICD-10-CM | POA: Diagnosis not present

## 2021-06-29 DIAGNOSIS — I152 Hypertension secondary to endocrine disorders: Secondary | ICD-10-CM | POA: Diagnosis not present

## 2021-06-29 DIAGNOSIS — E1159 Type 2 diabetes mellitus with other circulatory complications: Secondary | ICD-10-CM | POA: Diagnosis not present

## 2021-06-29 DIAGNOSIS — N183 Chronic kidney disease, stage 3 unspecified: Secondary | ICD-10-CM | POA: Diagnosis not present

## 2021-06-29 DIAGNOSIS — Z8616 Personal history of COVID-19: Secondary | ICD-10-CM | POA: Diagnosis not present

## 2021-06-29 DIAGNOSIS — R5381 Other malaise: Secondary | ICD-10-CM | POA: Diagnosis not present

## 2021-06-29 DIAGNOSIS — R1312 Dysphagia, oropharyngeal phase: Secondary | ICD-10-CM | POA: Diagnosis not present

## 2021-06-29 DIAGNOSIS — N179 Acute kidney failure, unspecified: Secondary | ICD-10-CM | POA: Diagnosis not present

## 2021-06-29 DIAGNOSIS — I739 Peripheral vascular disease, unspecified: Secondary | ICD-10-CM | POA: Diagnosis not present

## 2021-06-29 DIAGNOSIS — D638 Anemia in other chronic diseases classified elsewhere: Secondary | ICD-10-CM | POA: Diagnosis not present

## 2021-06-29 DIAGNOSIS — G903 Multi-system degeneration of the autonomic nervous system: Secondary | ICD-10-CM | POA: Diagnosis not present

## 2021-06-29 DIAGNOSIS — Z794 Long term (current) use of insulin: Secondary | ICD-10-CM | POA: Diagnosis not present

## 2021-06-29 DIAGNOSIS — G9341 Metabolic encephalopathy: Secondary | ICD-10-CM | POA: Diagnosis not present

## 2021-06-29 DIAGNOSIS — F0390 Unspecified dementia without behavioral disturbance: Secondary | ICD-10-CM | POA: Diagnosis not present

## 2021-06-30 DIAGNOSIS — R1312 Dysphagia, oropharyngeal phase: Secondary | ICD-10-CM | POA: Diagnosis not present

## 2021-06-30 DIAGNOSIS — G9341 Metabolic encephalopathy: Secondary | ICD-10-CM | POA: Diagnosis not present

## 2021-06-30 DIAGNOSIS — M6259 Muscle wasting and atrophy, not elsewhere classified, multiple sites: Secondary | ICD-10-CM | POA: Diagnosis not present

## 2021-07-03 DIAGNOSIS — R293 Abnormal posture: Secondary | ICD-10-CM | POA: Diagnosis not present

## 2021-07-03 DIAGNOSIS — M6259 Muscle wasting and atrophy, not elsewhere classified, multiple sites: Secondary | ICD-10-CM | POA: Diagnosis not present

## 2021-07-03 DIAGNOSIS — G9341 Metabolic encephalopathy: Secondary | ICD-10-CM | POA: Diagnosis not present

## 2021-07-03 DIAGNOSIS — R1312 Dysphagia, oropharyngeal phase: Secondary | ICD-10-CM | POA: Diagnosis not present

## 2021-07-04 DIAGNOSIS — M6259 Muscle wasting and atrophy, not elsewhere classified, multiple sites: Secondary | ICD-10-CM | POA: Diagnosis not present

## 2021-07-04 DIAGNOSIS — G9341 Metabolic encephalopathy: Secondary | ICD-10-CM | POA: Diagnosis not present

## 2021-07-04 DIAGNOSIS — R293 Abnormal posture: Secondary | ICD-10-CM | POA: Diagnosis not present

## 2021-07-04 DIAGNOSIS — R1312 Dysphagia, oropharyngeal phase: Secondary | ICD-10-CM | POA: Diagnosis not present

## 2021-07-05 DIAGNOSIS — M6259 Muscle wasting and atrophy, not elsewhere classified, multiple sites: Secondary | ICD-10-CM | POA: Diagnosis not present

## 2021-07-05 DIAGNOSIS — R1312 Dysphagia, oropharyngeal phase: Secondary | ICD-10-CM | POA: Diagnosis not present

## 2021-07-05 DIAGNOSIS — R293 Abnormal posture: Secondary | ICD-10-CM | POA: Diagnosis not present

## 2021-07-05 DIAGNOSIS — G9341 Metabolic encephalopathy: Secondary | ICD-10-CM | POA: Diagnosis not present

## 2021-07-06 DIAGNOSIS — R293 Abnormal posture: Secondary | ICD-10-CM | POA: Diagnosis not present

## 2021-07-06 DIAGNOSIS — Z20822 Contact with and (suspected) exposure to covid-19: Secondary | ICD-10-CM | POA: Diagnosis not present

## 2021-07-06 DIAGNOSIS — R1312 Dysphagia, oropharyngeal phase: Secondary | ICD-10-CM | POA: Diagnosis not present

## 2021-07-06 DIAGNOSIS — M6259 Muscle wasting and atrophy, not elsewhere classified, multiple sites: Secondary | ICD-10-CM | POA: Diagnosis not present

## 2021-07-06 DIAGNOSIS — G9341 Metabolic encephalopathy: Secondary | ICD-10-CM | POA: Diagnosis not present

## 2021-07-07 DIAGNOSIS — R293 Abnormal posture: Secondary | ICD-10-CM | POA: Diagnosis not present

## 2021-07-07 DIAGNOSIS — G9341 Metabolic encephalopathy: Secondary | ICD-10-CM | POA: Diagnosis not present

## 2021-07-07 DIAGNOSIS — R1312 Dysphagia, oropharyngeal phase: Secondary | ICD-10-CM | POA: Diagnosis not present

## 2021-07-07 DIAGNOSIS — M6259 Muscle wasting and atrophy, not elsewhere classified, multiple sites: Secondary | ICD-10-CM | POA: Diagnosis not present

## 2021-07-10 DIAGNOSIS — R293 Abnormal posture: Secondary | ICD-10-CM | POA: Diagnosis not present

## 2021-07-10 DIAGNOSIS — M6259 Muscle wasting and atrophy, not elsewhere classified, multiple sites: Secondary | ICD-10-CM | POA: Diagnosis not present

## 2021-07-10 DIAGNOSIS — R1312 Dysphagia, oropharyngeal phase: Secondary | ICD-10-CM | POA: Diagnosis not present

## 2021-07-10 DIAGNOSIS — G9341 Metabolic encephalopathy: Secondary | ICD-10-CM | POA: Diagnosis not present

## 2021-08-01 DIAGNOSIS — G9341 Metabolic encephalopathy: Secondary | ICD-10-CM | POA: Diagnosis not present

## 2021-08-01 DIAGNOSIS — R1312 Dysphagia, oropharyngeal phase: Secondary | ICD-10-CM | POA: Diagnosis not present

## 2021-08-01 DIAGNOSIS — M6259 Muscle wasting and atrophy, not elsewhere classified, multiple sites: Secondary | ICD-10-CM | POA: Diagnosis not present

## 2021-08-01 DIAGNOSIS — R293 Abnormal posture: Secondary | ICD-10-CM | POA: Diagnosis not present

## 2021-08-02 DIAGNOSIS — M6259 Muscle wasting and atrophy, not elsewhere classified, multiple sites: Secondary | ICD-10-CM | POA: Diagnosis not present

## 2021-08-02 DIAGNOSIS — R293 Abnormal posture: Secondary | ICD-10-CM | POA: Diagnosis not present

## 2021-08-02 DIAGNOSIS — G9341 Metabolic encephalopathy: Secondary | ICD-10-CM | POA: Diagnosis not present

## 2021-08-02 DIAGNOSIS — R1312 Dysphagia, oropharyngeal phase: Secondary | ICD-10-CM | POA: Diagnosis not present

## 2021-08-03 DIAGNOSIS — R293 Abnormal posture: Secondary | ICD-10-CM | POA: Diagnosis not present

## 2021-08-03 DIAGNOSIS — M6281 Muscle weakness (generalized): Secondary | ICD-10-CM | POA: Diagnosis not present

## 2021-08-03 DIAGNOSIS — G3183 Dementia with Lewy bodies: Secondary | ICD-10-CM | POA: Diagnosis not present

## 2021-08-04 DIAGNOSIS — G3183 Dementia with Lewy bodies: Secondary | ICD-10-CM | POA: Diagnosis not present

## 2021-08-04 DIAGNOSIS — R293 Abnormal posture: Secondary | ICD-10-CM | POA: Diagnosis not present

## 2021-08-04 DIAGNOSIS — M6281 Muscle weakness (generalized): Secondary | ICD-10-CM | POA: Diagnosis not present

## 2021-08-05 DIAGNOSIS — M6281 Muscle weakness (generalized): Secondary | ICD-10-CM | POA: Diagnosis not present

## 2021-08-05 DIAGNOSIS — R293 Abnormal posture: Secondary | ICD-10-CM | POA: Diagnosis not present

## 2021-08-05 DIAGNOSIS — G3183 Dementia with Lewy bodies: Secondary | ICD-10-CM | POA: Diagnosis not present

## 2021-08-08 DIAGNOSIS — R293 Abnormal posture: Secondary | ICD-10-CM | POA: Diagnosis not present

## 2021-08-08 DIAGNOSIS — G3183 Dementia with Lewy bodies: Secondary | ICD-10-CM | POA: Diagnosis not present

## 2021-08-08 DIAGNOSIS — M6281 Muscle weakness (generalized): Secondary | ICD-10-CM | POA: Diagnosis not present

## 2021-08-09 DIAGNOSIS — G3183 Dementia with Lewy bodies: Secondary | ICD-10-CM | POA: Diagnosis not present

## 2021-08-09 DIAGNOSIS — R293 Abnormal posture: Secondary | ICD-10-CM | POA: Diagnosis not present

## 2021-08-09 DIAGNOSIS — M6281 Muscle weakness (generalized): Secondary | ICD-10-CM | POA: Diagnosis not present

## 2021-08-12 DIAGNOSIS — G3183 Dementia with Lewy bodies: Secondary | ICD-10-CM | POA: Diagnosis not present

## 2021-08-12 DIAGNOSIS — M6281 Muscle weakness (generalized): Secondary | ICD-10-CM | POA: Diagnosis not present

## 2021-08-12 DIAGNOSIS — R293 Abnormal posture: Secondary | ICD-10-CM | POA: Diagnosis not present

## 2021-08-14 DIAGNOSIS — R293 Abnormal posture: Secondary | ICD-10-CM | POA: Diagnosis not present

## 2021-08-14 DIAGNOSIS — G3183 Dementia with Lewy bodies: Secondary | ICD-10-CM | POA: Diagnosis not present

## 2021-08-14 DIAGNOSIS — M6281 Muscle weakness (generalized): Secondary | ICD-10-CM | POA: Diagnosis not present

## 2021-08-15 DIAGNOSIS — R293 Abnormal posture: Secondary | ICD-10-CM | POA: Diagnosis not present

## 2021-08-15 DIAGNOSIS — G3183 Dementia with Lewy bodies: Secondary | ICD-10-CM | POA: Diagnosis not present

## 2021-08-15 DIAGNOSIS — M6281 Muscle weakness (generalized): Secondary | ICD-10-CM | POA: Diagnosis not present

## 2021-08-16 DIAGNOSIS — R293 Abnormal posture: Secondary | ICD-10-CM | POA: Diagnosis not present

## 2021-08-16 DIAGNOSIS — G3183 Dementia with Lewy bodies: Secondary | ICD-10-CM | POA: Diagnosis not present

## 2021-08-16 DIAGNOSIS — M6281 Muscle weakness (generalized): Secondary | ICD-10-CM | POA: Diagnosis not present

## 2021-08-17 DIAGNOSIS — G3183 Dementia with Lewy bodies: Secondary | ICD-10-CM | POA: Diagnosis not present

## 2021-08-17 DIAGNOSIS — M6281 Muscle weakness (generalized): Secondary | ICD-10-CM | POA: Diagnosis not present

## 2021-08-17 DIAGNOSIS — R293 Abnormal posture: Secondary | ICD-10-CM | POA: Diagnosis not present

## 2021-08-18 DIAGNOSIS — R293 Abnormal posture: Secondary | ICD-10-CM | POA: Diagnosis not present

## 2021-08-18 DIAGNOSIS — M6281 Muscle weakness (generalized): Secondary | ICD-10-CM | POA: Diagnosis not present

## 2021-08-18 DIAGNOSIS — G3183 Dementia with Lewy bodies: Secondary | ICD-10-CM | POA: Diagnosis not present

## 2021-08-21 DIAGNOSIS — G3183 Dementia with Lewy bodies: Secondary | ICD-10-CM | POA: Diagnosis not present

## 2021-08-21 DIAGNOSIS — R293 Abnormal posture: Secondary | ICD-10-CM | POA: Diagnosis not present

## 2021-08-21 DIAGNOSIS — M6281 Muscle weakness (generalized): Secondary | ICD-10-CM | POA: Diagnosis not present

## 2021-08-22 DIAGNOSIS — M6281 Muscle weakness (generalized): Secondary | ICD-10-CM | POA: Diagnosis not present

## 2021-08-22 DIAGNOSIS — R293 Abnormal posture: Secondary | ICD-10-CM | POA: Diagnosis not present

## 2021-08-22 DIAGNOSIS — G3183 Dementia with Lewy bodies: Secondary | ICD-10-CM | POA: Diagnosis not present

## 2021-08-24 DIAGNOSIS — M6281 Muscle weakness (generalized): Secondary | ICD-10-CM | POA: Diagnosis not present

## 2021-08-24 DIAGNOSIS — G3183 Dementia with Lewy bodies: Secondary | ICD-10-CM | POA: Diagnosis not present

## 2021-08-24 DIAGNOSIS — R293 Abnormal posture: Secondary | ICD-10-CM | POA: Diagnosis not present

## 2021-08-25 DIAGNOSIS — M6281 Muscle weakness (generalized): Secondary | ICD-10-CM | POA: Diagnosis not present

## 2021-08-25 DIAGNOSIS — G3183 Dementia with Lewy bodies: Secondary | ICD-10-CM | POA: Diagnosis not present

## 2021-08-25 DIAGNOSIS — R293 Abnormal posture: Secondary | ICD-10-CM | POA: Diagnosis not present

## 2021-08-27 DIAGNOSIS — G3183 Dementia with Lewy bodies: Secondary | ICD-10-CM | POA: Diagnosis not present

## 2021-08-27 DIAGNOSIS — R293 Abnormal posture: Secondary | ICD-10-CM | POA: Diagnosis not present

## 2021-08-27 DIAGNOSIS — M6281 Muscle weakness (generalized): Secondary | ICD-10-CM | POA: Diagnosis not present

## 2021-08-28 DIAGNOSIS — F330 Major depressive disorder, recurrent, mild: Secondary | ICD-10-CM | POA: Diagnosis not present

## 2021-08-28 DIAGNOSIS — G3183 Dementia with Lewy bodies: Secondary | ICD-10-CM | POA: Diagnosis not present

## 2021-08-28 DIAGNOSIS — R293 Abnormal posture: Secondary | ICD-10-CM | POA: Diagnosis not present

## 2021-08-28 DIAGNOSIS — F03C18 Unspecified dementia, severe, with other behavioral disturbance: Secondary | ICD-10-CM | POA: Diagnosis not present

## 2021-08-28 DIAGNOSIS — M6281 Muscle weakness (generalized): Secondary | ICD-10-CM | POA: Diagnosis not present

## 2021-08-29 DIAGNOSIS — F0390 Unspecified dementia without behavioral disturbance: Secondary | ICD-10-CM | POA: Diagnosis not present

## 2021-08-29 DIAGNOSIS — E1159 Type 2 diabetes mellitus with other circulatory complications: Secondary | ICD-10-CM | POA: Diagnosis not present

## 2021-08-29 DIAGNOSIS — M6281 Muscle weakness (generalized): Secondary | ICD-10-CM | POA: Diagnosis not present

## 2021-08-29 DIAGNOSIS — G3183 Dementia with Lewy bodies: Secondary | ICD-10-CM | POA: Diagnosis not present

## 2021-08-29 DIAGNOSIS — R293 Abnormal posture: Secondary | ICD-10-CM | POA: Diagnosis not present

## 2021-08-29 DIAGNOSIS — F0150 Vascular dementia without behavioral disturbance: Secondary | ICD-10-CM | POA: Diagnosis not present

## 2021-08-29 DIAGNOSIS — I739 Peripheral vascular disease, unspecified: Secondary | ICD-10-CM | POA: Diagnosis not present

## 2021-08-29 DIAGNOSIS — D649 Anemia, unspecified: Secondary | ICD-10-CM | POA: Diagnosis not present

## 2021-08-29 DIAGNOSIS — I152 Hypertension secondary to endocrine disorders: Secondary | ICD-10-CM | POA: Diagnosis not present

## 2021-08-30 DIAGNOSIS — M6281 Muscle weakness (generalized): Secondary | ICD-10-CM | POA: Diagnosis not present

## 2021-08-30 DIAGNOSIS — R293 Abnormal posture: Secondary | ICD-10-CM | POA: Diagnosis not present

## 2021-08-30 DIAGNOSIS — G3183 Dementia with Lewy bodies: Secondary | ICD-10-CM | POA: Diagnosis not present

## 2021-08-31 DIAGNOSIS — R293 Abnormal posture: Secondary | ICD-10-CM | POA: Diagnosis not present

## 2021-08-31 DIAGNOSIS — G3183 Dementia with Lewy bodies: Secondary | ICD-10-CM | POA: Diagnosis not present

## 2021-08-31 DIAGNOSIS — M6281 Muscle weakness (generalized): Secondary | ICD-10-CM | POA: Diagnosis not present

## 2021-09-01 DIAGNOSIS — G3183 Dementia with Lewy bodies: Secondary | ICD-10-CM | POA: Diagnosis not present

## 2021-09-01 DIAGNOSIS — R293 Abnormal posture: Secondary | ICD-10-CM | POA: Diagnosis not present

## 2021-09-01 DIAGNOSIS — M6281 Muscle weakness (generalized): Secondary | ICD-10-CM | POA: Diagnosis not present

## 2021-09-11 DIAGNOSIS — F03C18 Unspecified dementia, severe, with other behavioral disturbance: Secondary | ICD-10-CM | POA: Diagnosis not present

## 2021-09-11 DIAGNOSIS — F330 Major depressive disorder, recurrent, mild: Secondary | ICD-10-CM | POA: Diagnosis not present

## 2021-09-18 DIAGNOSIS — E785 Hyperlipidemia, unspecified: Secondary | ICD-10-CM | POA: Diagnosis not present

## 2021-09-18 DIAGNOSIS — N183 Chronic kidney disease, stage 3 unspecified: Secondary | ICD-10-CM | POA: Diagnosis not present

## 2021-09-18 DIAGNOSIS — H04123 Dry eye syndrome of bilateral lacrimal glands: Secondary | ICD-10-CM | POA: Diagnosis not present

## 2021-09-18 DIAGNOSIS — E1159 Type 2 diabetes mellitus with other circulatory complications: Secondary | ICD-10-CM | POA: Diagnosis not present

## 2021-10-05 ENCOUNTER — Other Ambulatory Visit: Payer: Self-pay | Admitting: *Deleted

## 2021-10-05 NOTE — Patient Outreach (Signed)
[facility]) Care Management  10/05/2021  JULEZ HUSEBY [birth year] [phone]   Noted per chart that member was admitted to [facility] for long term care last year.  Call placed to facility, confirmed he still remains for long term care.  No further needs identified.   Valente David, RN, MSN, [place] Manager [phone]

## 2021-10-10 DIAGNOSIS — G3183 Dementia with Lewy bodies: Secondary | ICD-10-CM | POA: Diagnosis not present

## 2021-10-10 DIAGNOSIS — R293 Abnormal posture: Secondary | ICD-10-CM | POA: Diagnosis not present

## 2021-10-10 DIAGNOSIS — M6281 Muscle weakness (generalized): Secondary | ICD-10-CM | POA: Diagnosis not present

## 2021-10-11 DIAGNOSIS — G3183 Dementia with Lewy bodies: Secondary | ICD-10-CM | POA: Diagnosis not present

## 2021-10-11 DIAGNOSIS — R293 Abnormal posture: Secondary | ICD-10-CM | POA: Diagnosis not present

## 2021-10-11 DIAGNOSIS — M6281 Muscle weakness (generalized): Secondary | ICD-10-CM | POA: Diagnosis not present

## 2021-10-12 DIAGNOSIS — M6281 Muscle weakness (generalized): Secondary | ICD-10-CM | POA: Diagnosis not present

## 2021-10-12 DIAGNOSIS — R293 Abnormal posture: Secondary | ICD-10-CM | POA: Diagnosis not present

## 2021-10-12 DIAGNOSIS — G3183 Dementia with Lewy bodies: Secondary | ICD-10-CM | POA: Diagnosis not present

## 2021-10-15 DIAGNOSIS — G3183 Dementia with Lewy bodies: Secondary | ICD-10-CM | POA: Diagnosis not present

## 2021-10-15 DIAGNOSIS — M6281 Muscle weakness (generalized): Secondary | ICD-10-CM | POA: Diagnosis not present

## 2021-10-15 DIAGNOSIS — R293 Abnormal posture: Secondary | ICD-10-CM | POA: Diagnosis not present

## 2021-10-16 DIAGNOSIS — R293 Abnormal posture: Secondary | ICD-10-CM | POA: Diagnosis not present

## 2021-10-16 DIAGNOSIS — M6281 Muscle weakness (generalized): Secondary | ICD-10-CM | POA: Diagnosis not present

## 2021-10-16 DIAGNOSIS — G3183 Dementia with Lewy bodies: Secondary | ICD-10-CM | POA: Diagnosis not present

## 2021-10-17 DIAGNOSIS — M6281 Muscle weakness (generalized): Secondary | ICD-10-CM | POA: Diagnosis not present

## 2021-10-17 DIAGNOSIS — R293 Abnormal posture: Secondary | ICD-10-CM | POA: Diagnosis not present

## 2021-10-17 DIAGNOSIS — G3183 Dementia with Lewy bodies: Secondary | ICD-10-CM | POA: Diagnosis not present

## 2021-10-18 DIAGNOSIS — M6281 Muscle weakness (generalized): Secondary | ICD-10-CM | POA: Diagnosis not present

## 2021-10-18 DIAGNOSIS — G3183 Dementia with Lewy bodies: Secondary | ICD-10-CM | POA: Diagnosis not present

## 2021-10-18 DIAGNOSIS — R293 Abnormal posture: Secondary | ICD-10-CM | POA: Diagnosis not present

## 2021-10-19 DIAGNOSIS — E119 Type 2 diabetes mellitus without complications: Secondary | ICD-10-CM | POA: Diagnosis not present

## 2021-10-19 DIAGNOSIS — Z794 Long term (current) use of insulin: Secondary | ICD-10-CM | POA: Diagnosis not present

## 2021-10-19 DIAGNOSIS — I152 Hypertension secondary to endocrine disorders: Secondary | ICD-10-CM | POA: Diagnosis not present

## 2021-10-19 DIAGNOSIS — F0390 Unspecified dementia without behavioral disturbance: Secondary | ICD-10-CM | POA: Diagnosis not present

## 2021-10-21 DIAGNOSIS — G3183 Dementia with Lewy bodies: Secondary | ICD-10-CM | POA: Diagnosis not present

## 2021-10-21 DIAGNOSIS — R293 Abnormal posture: Secondary | ICD-10-CM | POA: Diagnosis not present

## 2021-10-21 DIAGNOSIS — M6281 Muscle weakness (generalized): Secondary | ICD-10-CM | POA: Diagnosis not present

## 2021-10-23 DIAGNOSIS — M6281 Muscle weakness (generalized): Secondary | ICD-10-CM | POA: Diagnosis not present

## 2021-10-23 DIAGNOSIS — G3183 Dementia with Lewy bodies: Secondary | ICD-10-CM | POA: Diagnosis not present

## 2021-10-23 DIAGNOSIS — R293 Abnormal posture: Secondary | ICD-10-CM | POA: Diagnosis not present

## 2021-10-24 DIAGNOSIS — R293 Abnormal posture: Secondary | ICD-10-CM | POA: Diagnosis not present

## 2021-10-24 DIAGNOSIS — M6281 Muscle weakness (generalized): Secondary | ICD-10-CM | POA: Diagnosis not present

## 2021-10-24 DIAGNOSIS — G3183 Dementia with Lewy bodies: Secondary | ICD-10-CM | POA: Diagnosis not present

## 2021-10-25 DIAGNOSIS — M6281 Muscle weakness (generalized): Secondary | ICD-10-CM | POA: Diagnosis not present

## 2021-10-25 DIAGNOSIS — R293 Abnormal posture: Secondary | ICD-10-CM | POA: Diagnosis not present

## 2021-10-25 DIAGNOSIS — G3183 Dementia with Lewy bodies: Secondary | ICD-10-CM | POA: Diagnosis not present

## 2021-10-26 DIAGNOSIS — M6281 Muscle weakness (generalized): Secondary | ICD-10-CM | POA: Diagnosis not present

## 2021-10-26 DIAGNOSIS — R293 Abnormal posture: Secondary | ICD-10-CM | POA: Diagnosis not present

## 2021-10-26 DIAGNOSIS — G3183 Dementia with Lewy bodies: Secondary | ICD-10-CM | POA: Diagnosis not present

## 2021-10-27 DIAGNOSIS — G3183 Dementia with Lewy bodies: Secondary | ICD-10-CM | POA: Diagnosis not present

## 2021-10-27 DIAGNOSIS — R293 Abnormal posture: Secondary | ICD-10-CM | POA: Diagnosis not present

## 2021-10-27 DIAGNOSIS — M6281 Muscle weakness (generalized): Secondary | ICD-10-CM | POA: Diagnosis not present

## 2021-10-30 DIAGNOSIS — M6281 Muscle weakness (generalized): Secondary | ICD-10-CM | POA: Diagnosis not present

## 2021-10-30 DIAGNOSIS — G3183 Dementia with Lewy bodies: Secondary | ICD-10-CM | POA: Diagnosis not present

## 2021-10-30 DIAGNOSIS — R293 Abnormal posture: Secondary | ICD-10-CM | POA: Diagnosis not present

## 2021-10-31 DIAGNOSIS — M6281 Muscle weakness (generalized): Secondary | ICD-10-CM | POA: Diagnosis not present

## 2021-10-31 DIAGNOSIS — G903 Multi-system degeneration of the autonomic nervous system: Secondary | ICD-10-CM | POA: Diagnosis not present

## 2021-10-31 DIAGNOSIS — E1122 Type 2 diabetes mellitus with diabetic chronic kidney disease: Secondary | ICD-10-CM | POA: Diagnosis not present

## 2021-10-31 DIAGNOSIS — N183 Chronic kidney disease, stage 3 unspecified: Secondary | ICD-10-CM | POA: Diagnosis not present

## 2021-10-31 DIAGNOSIS — Z7902 Long term (current) use of antithrombotics/antiplatelets: Secondary | ICD-10-CM | POA: Diagnosis not present

## 2021-10-31 DIAGNOSIS — G3183 Dementia with Lewy bodies: Secondary | ICD-10-CM | POA: Diagnosis not present

## 2021-10-31 DIAGNOSIS — F0390 Unspecified dementia without behavioral disturbance: Secondary | ICD-10-CM | POA: Diagnosis not present

## 2021-10-31 DIAGNOSIS — E1151 Type 2 diabetes mellitus with diabetic peripheral angiopathy without gangrene: Secondary | ICD-10-CM | POA: Diagnosis not present

## 2021-10-31 DIAGNOSIS — R293 Abnormal posture: Secondary | ICD-10-CM | POA: Diagnosis not present

## 2021-10-31 DIAGNOSIS — R809 Proteinuria, unspecified: Secondary | ICD-10-CM | POA: Diagnosis not present

## 2021-10-31 DIAGNOSIS — Z7982 Long term (current) use of aspirin: Secondary | ICD-10-CM | POA: Diagnosis not present

## 2021-10-31 DIAGNOSIS — I152 Hypertension secondary to endocrine disorders: Secondary | ICD-10-CM | POA: Diagnosis not present

## 2021-10-31 DIAGNOSIS — D631 Anemia in chronic kidney disease: Secondary | ICD-10-CM | POA: Diagnosis not present

## 2021-10-31 DIAGNOSIS — R5381 Other malaise: Secondary | ICD-10-CM | POA: Diagnosis not present

## 2021-10-31 DIAGNOSIS — Z794 Long term (current) use of insulin: Secondary | ICD-10-CM | POA: Diagnosis not present

## 2021-11-01 DIAGNOSIS — Z23 Encounter for immunization: Secondary | ICD-10-CM | POA: Diagnosis not present

## 2021-11-01 DIAGNOSIS — G3183 Dementia with Lewy bodies: Secondary | ICD-10-CM | POA: Diagnosis not present

## 2021-11-01 DIAGNOSIS — R293 Abnormal posture: Secondary | ICD-10-CM | POA: Diagnosis not present

## 2021-11-01 DIAGNOSIS — M6281 Muscle weakness (generalized): Secondary | ICD-10-CM | POA: Diagnosis not present

## 2021-11-03 DIAGNOSIS — M6281 Muscle weakness (generalized): Secondary | ICD-10-CM | POA: Diagnosis not present

## 2021-11-03 DIAGNOSIS — G3183 Dementia with Lewy bodies: Secondary | ICD-10-CM | POA: Diagnosis not present

## 2021-11-03 DIAGNOSIS — R293 Abnormal posture: Secondary | ICD-10-CM | POA: Diagnosis not present

## 2021-11-06 DIAGNOSIS — M6281 Muscle weakness (generalized): Secondary | ICD-10-CM | POA: Diagnosis not present

## 2021-11-06 DIAGNOSIS — R293 Abnormal posture: Secondary | ICD-10-CM | POA: Diagnosis not present

## 2021-11-06 DIAGNOSIS — G3183 Dementia with Lewy bodies: Secondary | ICD-10-CM | POA: Diagnosis not present

## 2021-11-07 DIAGNOSIS — M6281 Muscle weakness (generalized): Secondary | ICD-10-CM | POA: Diagnosis not present

## 2021-11-07 DIAGNOSIS — R293 Abnormal posture: Secondary | ICD-10-CM | POA: Diagnosis not present

## 2021-11-07 DIAGNOSIS — G3183 Dementia with Lewy bodies: Secondary | ICD-10-CM | POA: Diagnosis not present

## 2021-11-08 DIAGNOSIS — G3183 Dementia with Lewy bodies: Secondary | ICD-10-CM | POA: Diagnosis not present

## 2021-11-08 DIAGNOSIS — R293 Abnormal posture: Secondary | ICD-10-CM | POA: Diagnosis not present

## 2021-11-08 DIAGNOSIS — M6281 Muscle weakness (generalized): Secondary | ICD-10-CM | POA: Diagnosis not present

## 2021-11-09 DIAGNOSIS — R293 Abnormal posture: Secondary | ICD-10-CM | POA: Diagnosis not present

## 2021-11-09 DIAGNOSIS — G3183 Dementia with Lewy bodies: Secondary | ICD-10-CM | POA: Diagnosis not present

## 2021-11-09 DIAGNOSIS — M6281 Muscle weakness (generalized): Secondary | ICD-10-CM | POA: Diagnosis not present

## 2021-11-10 DIAGNOSIS — R293 Abnormal posture: Secondary | ICD-10-CM | POA: Diagnosis not present

## 2021-11-10 DIAGNOSIS — G3183 Dementia with Lewy bodies: Secondary | ICD-10-CM | POA: Diagnosis not present

## 2021-11-10 DIAGNOSIS — M6281 Muscle weakness (generalized): Secondary | ICD-10-CM | POA: Diagnosis not present

## 2021-11-13 DIAGNOSIS — R293 Abnormal posture: Secondary | ICD-10-CM | POA: Diagnosis not present

## 2021-11-13 DIAGNOSIS — M6281 Muscle weakness (generalized): Secondary | ICD-10-CM | POA: Diagnosis not present

## 2021-11-13 DIAGNOSIS — G3183 Dementia with Lewy bodies: Secondary | ICD-10-CM | POA: Diagnosis not present

## 2021-11-14 DIAGNOSIS — M6281 Muscle weakness (generalized): Secondary | ICD-10-CM | POA: Diagnosis not present

## 2021-11-14 DIAGNOSIS — G3183 Dementia with Lewy bodies: Secondary | ICD-10-CM | POA: Diagnosis not present

## 2021-11-14 DIAGNOSIS — R293 Abnormal posture: Secondary | ICD-10-CM | POA: Diagnosis not present

## 2021-11-15 DIAGNOSIS — G3183 Dementia with Lewy bodies: Secondary | ICD-10-CM | POA: Diagnosis not present

## 2021-11-15 DIAGNOSIS — R293 Abnormal posture: Secondary | ICD-10-CM | POA: Diagnosis not present

## 2021-11-15 DIAGNOSIS — M6281 Muscle weakness (generalized): Secondary | ICD-10-CM | POA: Diagnosis not present

## 2021-11-16 DIAGNOSIS — G3183 Dementia with Lewy bodies: Secondary | ICD-10-CM | POA: Diagnosis not present

## 2021-11-16 DIAGNOSIS — R293 Abnormal posture: Secondary | ICD-10-CM | POA: Diagnosis not present

## 2021-11-16 DIAGNOSIS — M6281 Muscle weakness (generalized): Secondary | ICD-10-CM | POA: Diagnosis not present

## 2021-11-17 DIAGNOSIS — R293 Abnormal posture: Secondary | ICD-10-CM | POA: Diagnosis not present

## 2021-11-17 DIAGNOSIS — G3183 Dementia with Lewy bodies: Secondary | ICD-10-CM | POA: Diagnosis not present

## 2021-11-17 DIAGNOSIS — M6281 Muscle weakness (generalized): Secondary | ICD-10-CM | POA: Diagnosis not present

## 2021-11-20 DIAGNOSIS — M6281 Muscle weakness (generalized): Secondary | ICD-10-CM | POA: Diagnosis not present

## 2021-11-20 DIAGNOSIS — G3183 Dementia with Lewy bodies: Secondary | ICD-10-CM | POA: Diagnosis not present

## 2021-11-20 DIAGNOSIS — R293 Abnormal posture: Secondary | ICD-10-CM | POA: Diagnosis not present

## 2021-11-21 DIAGNOSIS — R293 Abnormal posture: Secondary | ICD-10-CM | POA: Diagnosis not present

## 2021-11-21 DIAGNOSIS — M6281 Muscle weakness (generalized): Secondary | ICD-10-CM | POA: Diagnosis not present

## 2021-11-21 DIAGNOSIS — G3183 Dementia with Lewy bodies: Secondary | ICD-10-CM | POA: Diagnosis not present

## 2021-11-22 DIAGNOSIS — G3183 Dementia with Lewy bodies: Secondary | ICD-10-CM | POA: Diagnosis not present

## 2021-11-22 DIAGNOSIS — R293 Abnormal posture: Secondary | ICD-10-CM | POA: Diagnosis not present

## 2021-11-22 DIAGNOSIS — M6281 Muscle weakness (generalized): Secondary | ICD-10-CM | POA: Diagnosis not present

## 2021-11-23 DIAGNOSIS — R293 Abnormal posture: Secondary | ICD-10-CM | POA: Diagnosis not present

## 2021-11-23 DIAGNOSIS — G3183 Dementia with Lewy bodies: Secondary | ICD-10-CM | POA: Diagnosis not present

## 2021-11-23 DIAGNOSIS — M6281 Muscle weakness (generalized): Secondary | ICD-10-CM | POA: Diagnosis not present

## 2021-11-24 DIAGNOSIS — G3183 Dementia with Lewy bodies: Secondary | ICD-10-CM | POA: Diagnosis not present

## 2021-11-24 DIAGNOSIS — M6281 Muscle weakness (generalized): Secondary | ICD-10-CM | POA: Diagnosis not present

## 2021-11-24 DIAGNOSIS — R293 Abnormal posture: Secondary | ICD-10-CM | POA: Diagnosis not present

## 2021-11-27 DIAGNOSIS — M6281 Muscle weakness (generalized): Secondary | ICD-10-CM | POA: Diagnosis not present

## 2021-11-27 DIAGNOSIS — R293 Abnormal posture: Secondary | ICD-10-CM | POA: Diagnosis not present

## 2021-11-27 DIAGNOSIS — G3183 Dementia with Lewy bodies: Secondary | ICD-10-CM | POA: Diagnosis not present

## 2021-11-28 DIAGNOSIS — R293 Abnormal posture: Secondary | ICD-10-CM | POA: Diagnosis not present

## 2021-11-28 DIAGNOSIS — G3183 Dementia with Lewy bodies: Secondary | ICD-10-CM | POA: Diagnosis not present

## 2021-11-28 DIAGNOSIS — M6281 Muscle weakness (generalized): Secondary | ICD-10-CM | POA: Diagnosis not present

## 2021-11-29 DIAGNOSIS — G3183 Dementia with Lewy bodies: Secondary | ICD-10-CM | POA: Diagnosis not present

## 2021-11-29 DIAGNOSIS — M6281 Muscle weakness (generalized): Secondary | ICD-10-CM | POA: Diagnosis not present

## 2021-11-29 DIAGNOSIS — R293 Abnormal posture: Secondary | ICD-10-CM | POA: Diagnosis not present

## 2021-11-30 DIAGNOSIS — R293 Abnormal posture: Secondary | ICD-10-CM | POA: Diagnosis not present

## 2021-11-30 DIAGNOSIS — M6281 Muscle weakness (generalized): Secondary | ICD-10-CM | POA: Diagnosis not present

## 2021-11-30 DIAGNOSIS — G3183 Dementia with Lewy bodies: Secondary | ICD-10-CM | POA: Diagnosis not present

## 2021-12-01 DIAGNOSIS — M6281 Muscle weakness (generalized): Secondary | ICD-10-CM | POA: Diagnosis not present

## 2021-12-01 DIAGNOSIS — R293 Abnormal posture: Secondary | ICD-10-CM | POA: Diagnosis not present

## 2021-12-01 DIAGNOSIS — G3183 Dementia with Lewy bodies: Secondary | ICD-10-CM | POA: Diagnosis not present

## 2021-12-04 DIAGNOSIS — G3183 Dementia with Lewy bodies: Secondary | ICD-10-CM | POA: Diagnosis not present

## 2021-12-04 DIAGNOSIS — M6281 Muscle weakness (generalized): Secondary | ICD-10-CM | POA: Diagnosis not present

## 2021-12-04 DIAGNOSIS — R293 Abnormal posture: Secondary | ICD-10-CM | POA: Diagnosis not present

## 2021-12-05 DIAGNOSIS — G3183 Dementia with Lewy bodies: Secondary | ICD-10-CM | POA: Diagnosis not present

## 2021-12-05 DIAGNOSIS — Z23 Encounter for immunization: Secondary | ICD-10-CM | POA: Diagnosis not present

## 2021-12-05 DIAGNOSIS — M6281 Muscle weakness (generalized): Secondary | ICD-10-CM | POA: Diagnosis not present

## 2021-12-05 DIAGNOSIS — R293 Abnormal posture: Secondary | ICD-10-CM | POA: Diagnosis not present

## 2021-12-06 DIAGNOSIS — M6281 Muscle weakness (generalized): Secondary | ICD-10-CM | POA: Diagnosis not present

## 2021-12-06 DIAGNOSIS — R293 Abnormal posture: Secondary | ICD-10-CM | POA: Diagnosis not present

## 2021-12-06 DIAGNOSIS — G3183 Dementia with Lewy bodies: Secondary | ICD-10-CM | POA: Diagnosis not present

## 2021-12-07 DIAGNOSIS — R293 Abnormal posture: Secondary | ICD-10-CM | POA: Diagnosis not present

## 2021-12-07 DIAGNOSIS — G3183 Dementia with Lewy bodies: Secondary | ICD-10-CM | POA: Diagnosis not present

## 2021-12-07 DIAGNOSIS — M6281 Muscle weakness (generalized): Secondary | ICD-10-CM | POA: Diagnosis not present

## 2021-12-08 DIAGNOSIS — M6281 Muscle weakness (generalized): Secondary | ICD-10-CM | POA: Diagnosis not present

## 2021-12-08 DIAGNOSIS — R293 Abnormal posture: Secondary | ICD-10-CM | POA: Diagnosis not present

## 2021-12-08 DIAGNOSIS — G3183 Dementia with Lewy bodies: Secondary | ICD-10-CM | POA: Diagnosis not present

## 2021-12-11 DIAGNOSIS — G3183 Dementia with Lewy bodies: Secondary | ICD-10-CM | POA: Diagnosis not present

## 2021-12-11 DIAGNOSIS — R293 Abnormal posture: Secondary | ICD-10-CM | POA: Diagnosis not present

## 2021-12-11 DIAGNOSIS — M6281 Muscle weakness (generalized): Secondary | ICD-10-CM | POA: Diagnosis not present

## 2021-12-26 DIAGNOSIS — I10 Essential (primary) hypertension: Secondary | ICD-10-CM | POA: Diagnosis not present

## 2021-12-29 DIAGNOSIS — E559 Vitamin D deficiency, unspecified: Secondary | ICD-10-CM | POA: Diagnosis not present

## 2021-12-29 DIAGNOSIS — E119 Type 2 diabetes mellitus without complications: Secondary | ICD-10-CM | POA: Diagnosis not present

## 2022-01-02 DIAGNOSIS — F0390 Unspecified dementia without behavioral disturbance: Secondary | ICD-10-CM | POA: Diagnosis not present

## 2022-01-02 DIAGNOSIS — I152 Hypertension secondary to endocrine disorders: Secondary | ICD-10-CM | POA: Diagnosis not present

## 2022-01-02 DIAGNOSIS — Z794 Long term (current) use of insulin: Secondary | ICD-10-CM | POA: Diagnosis not present

## 2022-01-02 DIAGNOSIS — E1129 Type 2 diabetes mellitus with other diabetic kidney complication: Secondary | ICD-10-CM | POA: Diagnosis not present

## 2022-01-03 DIAGNOSIS — M6281 Muscle weakness (generalized): Secondary | ICD-10-CM | POA: Diagnosis not present

## 2022-01-03 DIAGNOSIS — R1312 Dysphagia, oropharyngeal phase: Secondary | ICD-10-CM | POA: Diagnosis not present

## 2022-01-04 DIAGNOSIS — M6281 Muscle weakness (generalized): Secondary | ICD-10-CM | POA: Diagnosis not present

## 2022-01-04 DIAGNOSIS — R1312 Dysphagia, oropharyngeal phase: Secondary | ICD-10-CM | POA: Diagnosis not present

## 2022-01-06 DIAGNOSIS — M6281 Muscle weakness (generalized): Secondary | ICD-10-CM | POA: Diagnosis not present

## 2022-01-06 DIAGNOSIS — R1312 Dysphagia, oropharyngeal phase: Secondary | ICD-10-CM | POA: Diagnosis not present

## 2022-01-07 DIAGNOSIS — R1312 Dysphagia, oropharyngeal phase: Secondary | ICD-10-CM | POA: Diagnosis not present

## 2022-01-07 DIAGNOSIS — M6281 Muscle weakness (generalized): Secondary | ICD-10-CM | POA: Diagnosis not present

## 2022-01-09 DIAGNOSIS — E46 Unspecified protein-calorie malnutrition: Secondary | ICD-10-CM | POA: Diagnosis not present

## 2022-01-09 DIAGNOSIS — R1312 Dysphagia, oropharyngeal phase: Secondary | ICD-10-CM | POA: Diagnosis not present

## 2022-01-09 DIAGNOSIS — Z6821 Body mass index (BMI) 21.0-21.9, adult: Secondary | ICD-10-CM | POA: Diagnosis not present

## 2022-01-09 DIAGNOSIS — E559 Vitamin D deficiency, unspecified: Secondary | ICD-10-CM | POA: Diagnosis not present

## 2022-01-09 DIAGNOSIS — E1151 Type 2 diabetes mellitus with diabetic peripheral angiopathy without gangrene: Secondary | ICD-10-CM | POA: Diagnosis not present

## 2022-01-09 DIAGNOSIS — Z794 Long term (current) use of insulin: Secondary | ICD-10-CM | POA: Diagnosis not present

## 2022-01-09 DIAGNOSIS — Z7982 Long term (current) use of aspirin: Secondary | ICD-10-CM | POA: Diagnosis not present

## 2022-01-09 DIAGNOSIS — D509 Iron deficiency anemia, unspecified: Secondary | ICD-10-CM | POA: Diagnosis not present

## 2022-01-09 DIAGNOSIS — M6281 Muscle weakness (generalized): Secondary | ICD-10-CM | POA: Diagnosis not present

## 2022-01-10 DIAGNOSIS — M6281 Muscle weakness (generalized): Secondary | ICD-10-CM | POA: Diagnosis not present

## 2022-01-10 DIAGNOSIS — R1312 Dysphagia, oropharyngeal phase: Secondary | ICD-10-CM | POA: Diagnosis not present

## 2022-01-11 DIAGNOSIS — R1312 Dysphagia, oropharyngeal phase: Secondary | ICD-10-CM | POA: Diagnosis not present

## 2022-01-11 DIAGNOSIS — M6281 Muscle weakness (generalized): Secondary | ICD-10-CM | POA: Diagnosis not present

## 2022-01-12 DIAGNOSIS — M6281 Muscle weakness (generalized): Secondary | ICD-10-CM | POA: Diagnosis not present

## 2022-01-12 DIAGNOSIS — R1312 Dysphagia, oropharyngeal phase: Secondary | ICD-10-CM | POA: Diagnosis not present

## 2022-01-15 DIAGNOSIS — M6281 Muscle weakness (generalized): Secondary | ICD-10-CM | POA: Diagnosis not present

## 2022-01-15 DIAGNOSIS — R1312 Dysphagia, oropharyngeal phase: Secondary | ICD-10-CM | POA: Diagnosis not present

## 2022-01-16 DIAGNOSIS — R1312 Dysphagia, oropharyngeal phase: Secondary | ICD-10-CM | POA: Diagnosis not present

## 2022-01-16 DIAGNOSIS — M6281 Muscle weakness (generalized): Secondary | ICD-10-CM | POA: Diagnosis not present

## 2022-01-17 DIAGNOSIS — R1312 Dysphagia, oropharyngeal phase: Secondary | ICD-10-CM | POA: Diagnosis not present

## 2022-01-17 DIAGNOSIS — M6281 Muscle weakness (generalized): Secondary | ICD-10-CM | POA: Diagnosis not present

## 2022-01-18 DIAGNOSIS — M6281 Muscle weakness (generalized): Secondary | ICD-10-CM | POA: Diagnosis not present

## 2022-01-18 DIAGNOSIS — R1312 Dysphagia, oropharyngeal phase: Secondary | ICD-10-CM | POA: Diagnosis not present

## 2022-01-19 DIAGNOSIS — R1312 Dysphagia, oropharyngeal phase: Secondary | ICD-10-CM | POA: Diagnosis not present

## 2022-01-19 DIAGNOSIS — M6281 Muscle weakness (generalized): Secondary | ICD-10-CM | POA: Diagnosis not present

## 2022-01-22 DIAGNOSIS — M6281 Muscle weakness (generalized): Secondary | ICD-10-CM | POA: Diagnosis not present

## 2022-01-22 DIAGNOSIS — R1312 Dysphagia, oropharyngeal phase: Secondary | ICD-10-CM | POA: Diagnosis not present

## 2022-01-29 DIAGNOSIS — R1312 Dysphagia, oropharyngeal phase: Secondary | ICD-10-CM | POA: Diagnosis not present

## 2022-01-29 DIAGNOSIS — M6281 Muscle weakness (generalized): Secondary | ICD-10-CM | POA: Diagnosis not present

## 2022-02-01 DIAGNOSIS — R1312 Dysphagia, oropharyngeal phase: Secondary | ICD-10-CM | POA: Diagnosis not present

## 2022-02-01 DIAGNOSIS — M6281 Muscle weakness (generalized): Secondary | ICD-10-CM | POA: Diagnosis not present

## 2022-02-02 DIAGNOSIS — R1312 Dysphagia, oropharyngeal phase: Secondary | ICD-10-CM | POA: Diagnosis not present

## 2022-02-05 DIAGNOSIS — R1312 Dysphagia, oropharyngeal phase: Secondary | ICD-10-CM | POA: Diagnosis not present

## 2022-02-06 DIAGNOSIS — R1312 Dysphagia, oropharyngeal phase: Secondary | ICD-10-CM | POA: Diagnosis not present

## 2022-02-07 DIAGNOSIS — R1312 Dysphagia, oropharyngeal phase: Secondary | ICD-10-CM | POA: Diagnosis not present

## 2022-02-08 DIAGNOSIS — R1312 Dysphagia, oropharyngeal phase: Secondary | ICD-10-CM | POA: Diagnosis not present

## 2022-02-09 DIAGNOSIS — R1312 Dysphagia, oropharyngeal phase: Secondary | ICD-10-CM | POA: Diagnosis not present

## 2022-02-12 DIAGNOSIS — R1312 Dysphagia, oropharyngeal phase: Secondary | ICD-10-CM | POA: Diagnosis not present

## 2022-02-13 DIAGNOSIS — W19XXXA Unspecified fall, initial encounter: Secondary | ICD-10-CM | POA: Diagnosis not present

## 2022-02-13 DIAGNOSIS — R030 Elevated blood-pressure reading, without diagnosis of hypertension: Secondary | ICD-10-CM | POA: Diagnosis not present

## 2022-02-13 DIAGNOSIS — R1312 Dysphagia, oropharyngeal phase: Secondary | ICD-10-CM | POA: Diagnosis not present

## 2022-02-13 DIAGNOSIS — R269 Unspecified abnormalities of gait and mobility: Secondary | ICD-10-CM | POA: Diagnosis not present

## 2022-02-14 DIAGNOSIS — R1312 Dysphagia, oropharyngeal phase: Secondary | ICD-10-CM | POA: Diagnosis not present

## 2022-02-15 DIAGNOSIS — R1312 Dysphagia, oropharyngeal phase: Secondary | ICD-10-CM | POA: Diagnosis not present

## 2022-02-19 DIAGNOSIS — R1312 Dysphagia, oropharyngeal phase: Secondary | ICD-10-CM | POA: Diagnosis not present

## 2022-02-20 DIAGNOSIS — R1312 Dysphagia, oropharyngeal phase: Secondary | ICD-10-CM | POA: Diagnosis not present

## 2022-02-21 DIAGNOSIS — R1312 Dysphagia, oropharyngeal phase: Secondary | ICD-10-CM | POA: Diagnosis not present

## 2022-02-23 DIAGNOSIS — R1312 Dysphagia, oropharyngeal phase: Secondary | ICD-10-CM | POA: Diagnosis not present

## 2022-02-28 DIAGNOSIS — R1312 Dysphagia, oropharyngeal phase: Secondary | ICD-10-CM | POA: Diagnosis not present

## 2022-03-01 DIAGNOSIS — R5381 Other malaise: Secondary | ICD-10-CM | POA: Diagnosis not present

## 2022-03-01 DIAGNOSIS — Z7902 Long term (current) use of antithrombotics/antiplatelets: Secondary | ICD-10-CM | POA: Diagnosis not present

## 2022-03-01 DIAGNOSIS — I152 Hypertension secondary to endocrine disorders: Secondary | ICD-10-CM | POA: Diagnosis not present

## 2022-03-01 DIAGNOSIS — E1151 Type 2 diabetes mellitus with diabetic peripheral angiopathy without gangrene: Secondary | ICD-10-CM | POA: Diagnosis not present

## 2022-03-01 DIAGNOSIS — D631 Anemia in chronic kidney disease: Secondary | ICD-10-CM | POA: Diagnosis not present

## 2022-03-01 DIAGNOSIS — R1312 Dysphagia, oropharyngeal phase: Secondary | ICD-10-CM | POA: Diagnosis not present

## 2022-03-01 DIAGNOSIS — F0390 Unspecified dementia without behavioral disturbance: Secondary | ICD-10-CM | POA: Diagnosis not present

## 2022-03-01 DIAGNOSIS — E1122 Type 2 diabetes mellitus with diabetic chronic kidney disease: Secondary | ICD-10-CM | POA: Diagnosis not present

## 2022-03-01 DIAGNOSIS — N189 Chronic kidney disease, unspecified: Secondary | ICD-10-CM | POA: Diagnosis not present

## 2022-03-01 DIAGNOSIS — G903 Multi-system degeneration of the autonomic nervous system: Secondary | ICD-10-CM | POA: Diagnosis not present

## 2022-03-01 DIAGNOSIS — Z794 Long term (current) use of insulin: Secondary | ICD-10-CM | POA: Diagnosis not present

## 2022-03-01 DIAGNOSIS — Z7982 Long term (current) use of aspirin: Secondary | ICD-10-CM | POA: Diagnosis not present

## 2022-03-02 DIAGNOSIS — R1312 Dysphagia, oropharyngeal phase: Secondary | ICD-10-CM | POA: Diagnosis not present

## 2022-06-01 ENCOUNTER — Emergency Department (HOSPITAL_COMMUNITY): Payer: Medicare Other

## 2022-06-01 ENCOUNTER — Inpatient Hospital Stay (HOSPITAL_COMMUNITY)
Admission: EM | Admit: 2022-06-01 | Discharge: 2022-06-05 | DRG: 064 | Disposition: A | Discharge status: Skilled Nursing Facility | Payer: Medicare Other | Source: Skilled Nursing Facility | Attending: Internal Medicine | Admitting: Internal Medicine

## 2022-06-01 ENCOUNTER — Other Ambulatory Visit: Payer: Self-pay

## 2022-06-01 DIAGNOSIS — Z95820 Peripheral vascular angioplasty status with implants and grafts: Secondary | ICD-10-CM

## 2022-06-01 DIAGNOSIS — Z7189 Other specified counseling: Secondary | ICD-10-CM

## 2022-06-01 DIAGNOSIS — Z8249 Family history of ischemic heart disease and other diseases of the circulatory system: Secondary | ICD-10-CM

## 2022-06-01 DIAGNOSIS — R4182 Altered mental status, unspecified: Secondary | ICD-10-CM | POA: Diagnosis not present

## 2022-06-01 DIAGNOSIS — E876 Hypokalemia: Secondary | ICD-10-CM | POA: Diagnosis present

## 2022-06-01 DIAGNOSIS — Z7902 Long term (current) use of antithrombotics/antiplatelets: Secondary | ICD-10-CM

## 2022-06-01 DIAGNOSIS — J189 Pneumonia, unspecified organism: Secondary | ICD-10-CM

## 2022-06-01 DIAGNOSIS — F05 Delirium due to known physiological condition: Secondary | ICD-10-CM | POA: Diagnosis not present

## 2022-06-01 DIAGNOSIS — N179 Acute kidney failure, unspecified: Secondary | ICD-10-CM | POA: Diagnosis present

## 2022-06-01 DIAGNOSIS — I129 Hypertensive chronic kidney disease with stage 1 through stage 4 chronic kidney disease, or unspecified chronic kidney disease: Secondary | ICD-10-CM | POA: Diagnosis present

## 2022-06-01 DIAGNOSIS — E7800 Pure hypercholesterolemia, unspecified: Secondary | ICD-10-CM | POA: Diagnosis present

## 2022-06-01 DIAGNOSIS — E1151 Type 2 diabetes mellitus with diabetic peripheral angiopathy without gangrene: Secondary | ICD-10-CM | POA: Diagnosis present

## 2022-06-01 DIAGNOSIS — E1122 Type 2 diabetes mellitus with diabetic chronic kidney disease: Secondary | ICD-10-CM | POA: Diagnosis present

## 2022-06-01 DIAGNOSIS — Z1152 Encounter for screening for COVID-19: Secondary | ICD-10-CM

## 2022-06-01 DIAGNOSIS — I63541 Cerebral infarction due to unspecified occlusion or stenosis of right cerebellar artery: Secondary | ICD-10-CM | POA: Diagnosis not present

## 2022-06-01 DIAGNOSIS — E1165 Type 2 diabetes mellitus with hyperglycemia: Secondary | ICD-10-CM | POA: Diagnosis present

## 2022-06-01 DIAGNOSIS — N1832 Chronic kidney disease, stage 3b: Secondary | ICD-10-CM | POA: Diagnosis present

## 2022-06-01 DIAGNOSIS — B9620 Unspecified Escherichia coli [E. coli] as the cause of diseases classified elsewhere: Secondary | ICD-10-CM | POA: Diagnosis present

## 2022-06-01 DIAGNOSIS — Z79899 Other long term (current) drug therapy: Secondary | ICD-10-CM

## 2022-06-01 DIAGNOSIS — Z794 Long term (current) use of insulin: Secondary | ICD-10-CM

## 2022-06-01 DIAGNOSIS — E119 Type 2 diabetes mellitus without complications: Secondary | ICD-10-CM

## 2022-06-01 DIAGNOSIS — G928 Other toxic encephalopathy: Secondary | ICD-10-CM | POA: Diagnosis present

## 2022-06-01 DIAGNOSIS — F0150 Vascular dementia without behavioral disturbance: Secondary | ICD-10-CM | POA: Diagnosis present

## 2022-06-01 DIAGNOSIS — Z87891 Personal history of nicotine dependence: Secondary | ICD-10-CM

## 2022-06-01 DIAGNOSIS — N3001 Acute cystitis with hematuria: Secondary | ICD-10-CM | POA: Diagnosis present

## 2022-06-01 DIAGNOSIS — G9341 Metabolic encephalopathy: Secondary | ICD-10-CM

## 2022-06-01 DIAGNOSIS — R29810 Facial weakness: Secondary | ICD-10-CM | POA: Diagnosis present

## 2022-06-01 DIAGNOSIS — I739 Peripheral vascular disease, unspecified: Secondary | ICD-10-CM | POA: Diagnosis present

## 2022-06-01 DIAGNOSIS — I10 Essential (primary) hypertension: Secondary | ICD-10-CM | POA: Diagnosis present

## 2022-06-01 DIAGNOSIS — Z833 Family history of diabetes mellitus: Secondary | ICD-10-CM

## 2022-06-01 DIAGNOSIS — B9781 Human metapneumovirus as the cause of diseases classified elsewhere: Secondary | ICD-10-CM

## 2022-06-01 DIAGNOSIS — H5462 Unqualified visual loss, left eye, normal vision right eye: Secondary | ICD-10-CM | POA: Diagnosis present

## 2022-06-01 DIAGNOSIS — Z7982 Long term (current) use of aspirin: Secondary | ICD-10-CM

## 2022-06-01 DIAGNOSIS — Z8673 Personal history of transient ischemic attack (TIA), and cerebral infarction without residual deficits: Secondary | ICD-10-CM

## 2022-06-01 DIAGNOSIS — R29718 NIHSS score 18: Secondary | ICD-10-CM | POA: Diagnosis present

## 2022-06-01 DIAGNOSIS — R5381 Other malaise: Secondary | ICD-10-CM | POA: Diagnosis present

## 2022-06-01 DIAGNOSIS — Z9181 History of falling: Secondary | ICD-10-CM

## 2022-06-01 DIAGNOSIS — E785 Hyperlipidemia, unspecified: Secondary | ICD-10-CM | POA: Diagnosis present

## 2022-06-01 LAB — COMPREHENSIVE METABOLIC PANEL
ALT: 17 U/L (ref 0–44)
AST: 22 U/L (ref 15–41)
Albumin: 3 g/dL — ABNORMAL LOW (ref 3.5–5.0)
Alkaline Phosphatase: 60 U/L (ref 38–126)
Anion gap: 13 (ref 5–15)
BUN: 19 mg/dL (ref 8–23)
CO2: 26 mmol/L (ref 22–32)
Calcium: 8.8 mg/dL — ABNORMAL LOW (ref 8.9–10.3)
Chloride: 102 mmol/L (ref 98–111)
Creatinine, Ser: 1.83 mg/dL — ABNORMAL HIGH (ref 0.61–1.24)
GFR, Estimated: 38 mL/min — ABNORMAL LOW (ref >60)
Glucose, Bld: 277 mg/dL — ABNORMAL HIGH (ref 70–99)
Potassium: 3.4 mmol/L — ABNORMAL LOW (ref 3.5–5.1)
Sodium: 141 mmol/L (ref 135–145)
Total Bilirubin: 1.1 mg/dL (ref 0.3–1.2)
Total Protein: 6.6 g/dL (ref 6.5–8.1)

## 2022-06-01 LAB — CBC WITH DIFFERENTIAL/PLATELET
Abs Immature Granulocytes: 0.11 10*3/uL — ABNORMAL HIGH (ref 0.00–0.07)
Basophils Absolute: 0 10*3/uL (ref 0.0–0.1)
Basophils Relative: 0 %
Eosinophils Absolute: 0 10*3/uL (ref 0.0–0.5)
Eosinophils Relative: 0 %
HCT: 32.8 % — ABNORMAL LOW (ref 39.0–52.0)
Hemoglobin: 10.7 g/dL — ABNORMAL LOW (ref 13.0–17.0)
Immature Granulocytes: 1 %
Lymphocytes Relative: 9 %
Lymphs Abs: 1.3 10*3/uL (ref 0.7–4.0)
MCH: 29.6 pg (ref 26.0–34.0)
MCHC: 32.6 g/dL (ref 30.0–36.0)
MCV: 90.6 fL (ref 80.0–100.0)
Monocytes Absolute: 0.9 10*3/uL (ref 0.1–1.0)
Monocytes Relative: 7 %
Neutro Abs: 11.4 10*3/uL — ABNORMAL HIGH (ref 1.7–7.7)
Neutrophils Relative %: 83 %
Platelets: 205 10*3/uL (ref 150–400)
RBC: 3.62 MIL/uL — ABNORMAL LOW (ref 4.22–5.81)
RDW: 14.8 % (ref 11.5–15.5)
WBC: 13.7 10*3/uL — ABNORMAL HIGH (ref 4.0–10.5)
nRBC: 0 % (ref 0.0–0.2)

## 2022-06-01 LAB — I-STAT VENOUS BLOOD GAS, ED
Acid-Base Excess: 2 mmol/L (ref 0.0–2.0)
Bicarbonate: 28 mmol/L (ref 20.0–28.0)
Calcium, Ion: 1.18 mmol/L (ref 1.15–1.40)
HCT: 34 % — ABNORMAL LOW (ref 39.0–52.0)
Hemoglobin: 11.6 g/dL — ABNORMAL LOW (ref 13.0–17.0)
O2 Saturation: 50 %
Potassium: 3.3 mmol/L — ABNORMAL LOW (ref 3.5–5.1)
Sodium: 143 mmol/L (ref 135–145)
TCO2: 29 mmol/L (ref 22–32)
pCO2, Ven: 48.8 mmHg (ref 44–60)
pH, Ven: 7.367 (ref 7.25–7.43)
pO2, Ven: 28 mmHg — CL (ref 32–45)

## 2022-06-01 LAB — APTT: aPTT: 30 seconds (ref 24–36)

## 2022-06-01 LAB — PROTIME-INR
INR: 1.3 — ABNORMAL HIGH (ref 0.8–1.2)
Prothrombin Time: 16 seconds — ABNORMAL HIGH (ref 11.4–15.2)

## 2022-06-01 LAB — BRAIN NATRIURETIC PEPTIDE: B Natriuretic Peptide: 157.1 pg/mL — ABNORMAL HIGH (ref 0.0–100.0)

## 2022-06-01 LAB — LACTIC ACID, PLASMA: Lactic Acid, Venous: 1.7 mmol/L (ref 0.5–1.9)

## 2022-06-01 MED ORDER — SODIUM CHLORIDE 0.9 % IV SOLN
1.0000 g | Freq: Once | INTRAVENOUS | Status: AC
  Administered 2022-06-01: 1 g via INTRAVENOUS
  Filled 2022-06-01: qty 10

## 2022-06-01 MED ORDER — SODIUM CHLORIDE 0.9 % IV SOLN
500.0000 mg | Freq: Once | INTRAVENOUS | Status: AC
  Administered 2022-06-01: 500 mg via INTRAVENOUS
  Filled 2022-06-01: qty 5

## 2022-06-01 NOTE — ED Notes (Addendum)
Patient's daughter arrives to room and shares that patient is not an O2 user at baseline. Patient is normally A&O x3
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
## 2022-06-01 NOTE — ED Notes (Signed)
[phone] Daughter [name]

## 2022-06-02 ENCOUNTER — Other Ambulatory Visit (HOSPITAL_COMMUNITY): Payer: Medicare Other

## 2022-06-02 ENCOUNTER — Inpatient Hospital Stay (HOSPITAL_COMMUNITY): Payer: Medicare Other

## 2022-06-02 ENCOUNTER — Emergency Department (HOSPITAL_COMMUNITY): Payer: Medicare Other

## 2022-06-02 ENCOUNTER — Observation Stay (HOSPITAL_COMMUNITY): Payer: Medicare Other

## 2022-06-02 DIAGNOSIS — E1165 Type 2 diabetes mellitus with hyperglycemia: Secondary | ICD-10-CM | POA: Diagnosis present

## 2022-06-02 DIAGNOSIS — E7800 Pure hypercholesterolemia, unspecified: Secondary | ICD-10-CM | POA: Diagnosis present

## 2022-06-02 DIAGNOSIS — Z8249 Family history of ischemic heart disease and other diseases of the circulatory system: Secondary | ICD-10-CM | POA: Diagnosis not present

## 2022-06-02 DIAGNOSIS — Z95820 Peripheral vascular angioplasty status with implants and grafts: Secondary | ICD-10-CM | POA: Diagnosis not present

## 2022-06-02 DIAGNOSIS — J189 Pneumonia, unspecified organism: Secondary | ICD-10-CM

## 2022-06-02 DIAGNOSIS — N3001 Acute cystitis with hematuria: Secondary | ICD-10-CM | POA: Diagnosis present

## 2022-06-02 DIAGNOSIS — I63541 Cerebral infarction due to unspecified occlusion or stenosis of right cerebellar artery: Secondary | ICD-10-CM | POA: Diagnosis present

## 2022-06-02 DIAGNOSIS — E876 Hypokalemia: Secondary | ICD-10-CM | POA: Diagnosis present

## 2022-06-02 DIAGNOSIS — G928 Other toxic encephalopathy: Secondary | ICD-10-CM | POA: Diagnosis present

## 2022-06-02 DIAGNOSIS — G9341 Metabolic encephalopathy: Secondary | ICD-10-CM | POA: Diagnosis not present

## 2022-06-02 DIAGNOSIS — Z87891 Personal history of nicotine dependence: Secondary | ICD-10-CM | POA: Diagnosis not present

## 2022-06-02 DIAGNOSIS — Z7189 Other specified counseling: Secondary | ICD-10-CM

## 2022-06-02 DIAGNOSIS — Z9181 History of falling: Secondary | ICD-10-CM | POA: Diagnosis not present

## 2022-06-02 DIAGNOSIS — Z789 Other specified health status: Secondary | ICD-10-CM

## 2022-06-02 DIAGNOSIS — B9620 Unspecified Escherichia coli [E. coli] as the cause of diseases classified elsewhere: Secondary | ICD-10-CM | POA: Diagnosis present

## 2022-06-02 DIAGNOSIS — Z711 Person with feared health complaint in whom no diagnosis is made: Secondary | ICD-10-CM

## 2022-06-02 DIAGNOSIS — I6389 Other cerebral infarction: Secondary | ICD-10-CM | POA: Diagnosis not present

## 2022-06-02 DIAGNOSIS — F03918 Unspecified dementia, unspecified severity, with other behavioral disturbance: Secondary | ICD-10-CM | POA: Diagnosis not present

## 2022-06-02 DIAGNOSIS — Z1152 Encounter for screening for COVID-19: Secondary | ICD-10-CM | POA: Diagnosis not present

## 2022-06-02 DIAGNOSIS — E1122 Type 2 diabetes mellitus with diabetic chronic kidney disease: Secondary | ICD-10-CM | POA: Diagnosis present

## 2022-06-02 DIAGNOSIS — I129 Hypertensive chronic kidney disease with stage 1 through stage 4 chronic kidney disease, or unspecified chronic kidney disease: Secondary | ICD-10-CM | POA: Diagnosis present

## 2022-06-02 DIAGNOSIS — N179 Acute kidney failure, unspecified: Secondary | ICD-10-CM | POA: Diagnosis not present

## 2022-06-02 DIAGNOSIS — R4182 Altered mental status, unspecified: Secondary | ICD-10-CM | POA: Diagnosis present

## 2022-06-02 DIAGNOSIS — E1151 Type 2 diabetes mellitus with diabetic peripheral angiopathy without gangrene: Secondary | ICD-10-CM | POA: Diagnosis present

## 2022-06-02 DIAGNOSIS — F0150 Vascular dementia without behavioral disturbance: Secondary | ICD-10-CM | POA: Diagnosis present

## 2022-06-02 DIAGNOSIS — Z833 Family history of diabetes mellitus: Secondary | ICD-10-CM | POA: Diagnosis not present

## 2022-06-02 DIAGNOSIS — R29810 Facial weakness: Secondary | ICD-10-CM | POA: Diagnosis present

## 2022-06-02 DIAGNOSIS — Z515 Encounter for palliative care: Secondary | ICD-10-CM

## 2022-06-02 DIAGNOSIS — Z794 Long term (current) use of insulin: Secondary | ICD-10-CM | POA: Diagnosis not present

## 2022-06-02 DIAGNOSIS — N390 Urinary tract infection, site not specified: Secondary | ICD-10-CM | POA: Diagnosis not present

## 2022-06-02 DIAGNOSIS — F05 Delirium due to known physiological condition: Secondary | ICD-10-CM | POA: Diagnosis not present

## 2022-06-02 DIAGNOSIS — Z8673 Personal history of transient ischemic attack (TIA), and cerebral infarction without residual deficits: Secondary | ICD-10-CM | POA: Diagnosis not present

## 2022-06-02 DIAGNOSIS — N1832 Chronic kidney disease, stage 3b: Secondary | ICD-10-CM | POA: Diagnosis present

## 2022-06-02 LAB — LACTIC ACID, PLASMA
Lactic Acid, Venous: 2.5 mmol/L (ref 0.5–1.9)
Lactic Acid, Venous: 1.2 mmol/L (ref 0.5–1.9)
Lactic Acid, Venous: 1.3 mmol/L (ref 0.5–1.9)

## 2022-06-02 LAB — URINALYSIS, ROUTINE W REFLEX MICROSCOPIC
Bilirubin Urine: NEGATIVE
Glucose, UA: NEGATIVE mg/dL
Ketones, ur: NEGATIVE mg/dL
Nitrite: NEGATIVE
Protein, ur: 100 mg/dL — AB
Specific Gravity, Urine: 1.031 — ABNORMAL HIGH (ref 1.005–1.030)
WBC, UA: >50 WBC/hpf (ref 0–5)
pH: 5 (ref 5.0–8.0)

## 2022-06-02 LAB — SARS CORONAVIRUS 2 BY RT PCR: SARS Coronavirus 2 by RT PCR: NEGATIVE

## 2022-06-02 LAB — GLUCOSE, CAPILLARY
Glucose-Capillary: 185 mg/dL — ABNORMAL HIGH (ref 70–99)
Glucose-Capillary: 87 mg/dL (ref 70–99)
Glucose-Capillary: 73 mg/dL (ref 70–99)

## 2022-06-02 LAB — CBG MONITORING, ED: Glucose-Capillary: 164 mg/dL — ABNORMAL HIGH (ref 70–99)

## 2022-06-02 MED ORDER — IOHEXOL 350 MG/ML SOLN
50.0000 mL | Freq: Once | INTRAVENOUS | Status: AC | PRN
  Administered 2022-06-02: 50 mL via INTRAVENOUS

## 2022-06-02 MED ORDER — SODIUM CHLORIDE 0.9 % IV SOLN
1.0000 g | INTRAVENOUS | Status: DC
  Administered 2022-06-02 – 2022-06-03 (×2): 1 g via INTRAVENOUS
  Filled 2022-06-02 (×2): qty 10

## 2022-06-02 MED ORDER — INSULIN ASPART 100 UNIT/ML IJ SOLN
0.0000 [IU] | Freq: Three times a day (TID) | INTRAMUSCULAR | Status: DC
  Administered 2022-06-02: 3 [IU] via SUBCUTANEOUS
  Administered 2022-06-03: 2 [IU] via SUBCUTANEOUS
  Administered 2022-06-03 – 2022-06-05 (×5): 3 [IU] via SUBCUTANEOUS

## 2022-06-02 MED ORDER — ASPIRIN 300 MG RE SUPP
300.0000 mg | Freq: Every day | RECTAL | Status: DC
  Administered 2022-06-02 – 2022-06-03 (×2): 300 mg via RECTAL
  Filled 2022-06-02 (×2): qty 1

## 2022-06-02 MED ORDER — ASPIRIN 325 MG PO TABS: 325.0000 mg | ORAL_TABLET | Freq: Every day | ORAL | Status: DC

## 2022-06-02 MED ORDER — ACETAMINOPHEN 160 MG/5ML PO SOLN: 650.0000 mg | ORAL | Status: DC | PRN

## 2022-06-02 MED ORDER — SODIUM CHLORIDE 0.9 % IV SOLN: INTRAVENOUS | Status: DC

## 2022-06-02 MED ORDER — ENOXAPARIN SODIUM 40 MG/0.4ML IJ SOSY
40.0000 mg | PREFILLED_SYRINGE | INTRAMUSCULAR | Status: DC
  Administered 2022-06-02 – 2022-06-05 (×4): 40 mg via SUBCUTANEOUS
  Filled 2022-06-02 (×4): qty 0.4

## 2022-06-02 MED ORDER — IOHEXOL 350 MG/ML SOLN: 50.0000 mL | Freq: Once | INTRAVENOUS | Status: DC | PRN

## 2022-06-02 MED ORDER — LORAZEPAM 2 MG/ML IJ SOLN
1.0000 mg | Freq: Once | INTRAMUSCULAR | Status: AC | PRN
  Administered 2022-06-02: 1 mg via INTRAVENOUS

## 2022-06-02 MED ORDER — LORAZEPAM 2 MG/ML IJ SOLN
1.0000 mg | Freq: Once | INTRAMUSCULAR | Status: AC | PRN
  Administered 2022-06-02: 1 mg via INTRAVENOUS
  Filled 2022-06-02: qty 1

## 2022-06-02 MED ORDER — SENNOSIDES-DOCUSATE SODIUM 8.6-50 MG PO TABS: 1.0000 | ORAL_TABLET | Freq: Every evening | ORAL | Status: DC | PRN

## 2022-06-02 MED ORDER — IPRATROPIUM-ALBUTEROL 0.5-2.5 (3) MG/3ML IN SOLN
3.0000 mL | Freq: Once | RESPIRATORY_TRACT | Status: AC
  Administered 2022-06-02: 3 mL via RESPIRATORY_TRACT
  Filled 2022-06-02: qty 3

## 2022-06-02 MED ORDER — LORAZEPAM 2 MG/ML IJ SOLN
INTRAMUSCULAR | Status: AC
  Filled 2022-06-02: qty 1

## 2022-06-02 MED ORDER — POTASSIUM CHLORIDE 10 MEQ/100ML IV SOLN
10.0000 meq | INTRAVENOUS | Status: AC
  Administered 2022-06-02 (×4): 10 meq via INTRAVENOUS
  Filled 2022-06-02 (×5): qty 100

## 2022-06-02 MED ORDER — STROKE: EARLY STAGES OF RECOVERY BOOK
Freq: Once | Status: AC
  Administered 2022-06-02: 1
  Filled 2022-06-02: qty 1

## 2022-06-02 MED ORDER — LACTATED RINGERS IV BOLUS
1000.0000 mL | Freq: Once | INTRAVENOUS | Status: AC
  Administered 2022-06-02: 1000 mL via INTRAVENOUS

## 2022-06-02 MED ORDER — ACETAMINOPHEN 325 MG PO TABS
650.0000 mg | ORAL_TABLET | ORAL | Status: DC | PRN
  Administered 2022-06-02: 650 mg via ORAL

## 2022-06-02 MED ORDER — INSULIN ASPART 100 UNIT/ML IJ SOLN: 0.0000 [IU] | Freq: Every day | INTRAMUSCULAR | Status: DC

## 2022-06-02 MED ORDER — STROKE: EARLY STAGES OF RECOVERY BOOK: Freq: Once | Status: AC

## 2022-06-02 MED ORDER — ACETAMINOPHEN 650 MG RE SUPP
650.0000 mg | RECTAL | Status: DC | PRN
  Filled 2022-06-02 (×2): qty 1

## 2022-06-02 NOTE — Progress Notes (Signed)
STAT call from radiology regarding MRI - 2 tiny punctate infarcts in R frontal lobe and cerebellum.  This is not unexpected but may not be sufficient to fully explain his symptoms.  Neurology notified, plan as noted previously with regards to CVA as well as continuing to treat alternative causes of AMS (likely UTI).  Daughter updated.  Vascular surgery consulted, Dr. Trula Slade to see today but patient will need stabilization of neurologic status prior to OR (so maybe last next week).  Carlyon Shadow, M.D.

## 2022-06-02 NOTE — ED Provider Notes (Signed)
12:44 AM Assumed care from Dr. Pearline Cables, please see their note for full history, physical and decision making until this point. In brief this is a 77 y.o. year old male who presented to the ED tonight with Altered Mental Status (Patient to ED via EMS with compaint of alterted mental status today at [facility]. Patient was found around 3pm to be altered from his normal mentation with slurred speech and left facial droop. No one at the facility could confirm patient's mental status today. LKN is assumed as being last night. Patient A&O to self in triage. Patient has a junky sounding weak cough. 20 gauge IV right hand. EMS reports giving patient 500 mL NS in route.)     Here with cough, confusion, leukocytosis, questionable xr for pneumonia. Had antibiotics. Pending ct's. Likely needs admitted depending on response.   Patient with likely PNA and also possible UTi. Already had abx. Daughter states patient is still not at baseline. Will admit for further treatment.   Labs, studies and imaging reviewed by myself and considered in medical decision making if ordered. Imaging interpreted by radiology.  Labs Reviewed  CBC WITH DIFFERENTIAL/PLATELET - Abnormal; Notable for the following components:      Result Value   WBC 13.7 (*)    RBC 3.62 (*)    Hemoglobin 10.7 (*)    HCT 32.8 (*)    Neutro Abs 11.4 (*)    Abs Immature Granulocytes 0.11 (*)    All other components within normal limits  COMPREHENSIVE METABOLIC PANEL - Abnormal; Notable for the following components:   Potassium 3.4 (*)    Glucose, Bld 277 (*)    Creatinine, Ser 1.83 (*)    Calcium 8.8 (*)    Albumin 3.0 (*)    GFR, Estimated 38 (*)    All other components within normal limits  BRAIN NATRIURETIC PEPTIDE - Abnormal; Notable for the following components:   B Natriuretic Peptide 157.1 (*)    All other components within normal limits  PROTIME-INR - Abnormal; Notable for the following components:   Prothrombin Time 16.0 (*)     INR 1.3 (*)    All other components within normal limits  I-STAT VENOUS BLOOD GAS, ED - Abnormal; Notable for the following components:   pO2, Ven 28 (*)    Potassium 3.3 (*)    HCT 34.0 (*)    Hemoglobin 11.6 (*)    All other components within normal limits  SARS CORONAVIRUS 2 BY RT PCR  CULTURE, BLOOD (ROUTINE X 2)  CULTURE, BLOOD (ROUTINE X 2)  LACTIC ACID, PLASMA  APTT  URINALYSIS, ROUTINE W REFLEX MICROSCOPIC  LACTIC ACID, PLASMA    DG Chest Portable 1 View  Final Result    CT ANGIO HEAD NECK W WO CM    (Results Pending)  CT Chest Wo Contrast    (Results Pending)    No follow-ups on file.    Merrily Pew, MD 06/03/22 [phone]

## 2022-06-02 NOTE — ED Notes (Signed)
Neuro at BS.

## 2022-06-02 NOTE — ED Notes (Signed)
MRI complete, transporting to 3W20

## 2022-06-02 NOTE — ED Notes (Signed)
Patient incont of stool and urine. Peri care performed and condom cath placed on patient. Clean brief put around the condom cath
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
## 2022-06-02 NOTE — ED Notes (Addendum)
Pt arousable to voice, or awake. Opens R eye. Blind in L/ opaque. Localizes pain, not following commands. Non-verbal.  Prefers Lying in fetal position on L side. Has Moved all 4 extremities. Gross general weakness/ataxia. Failed swallow by default. Will keep NPO and order SLP.

## 2022-06-02 NOTE — Progress Notes (Signed)
Received pt on the unit. Pt is sleep bed is in the lowest position and alarm is set.

## 2022-06-02 NOTE — ED Notes (Signed)
Clearance xrays complete, MRI notified, RN will take pt to MRI, ativan given.

## 2022-06-02 NOTE — ED Notes (Signed)
Repeat, 3rd lactic sent to lab

## 2022-06-02 NOTE — ED Notes (Signed)
Admitting MD into see, pt awakened.
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
## 2022-06-02 NOTE — Progress Notes (Signed)
SLP Cancellation Note  Patient Details Name: Harry [name] MRN: [id number] DOB: 08-30-1945   Cancelled treatment:    Per RN, pt not alert enough for PO trials. Hold evaluation. Will follow as appropriate.     Reason Eval/Treat Not Completed: Patient's level of consciousness  Aroura Vasudevan P. Chasmine Lender, M.S., [place] Pathologist Acute Rehabilitation Services Pager: [place] 06/02/2022, 1:19 PM
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
## 2022-06-02 NOTE — ED Notes (Signed)
Admitting finished at [facility]. Pt returns to/ continues to sleep, NAD, calm. VSS, NSR on monitor.

## 2022-06-02 NOTE — ED Notes (Signed)
Pt restless/ moving on MRI table, despite previous ativan, admitting notified.

## 2022-06-02 NOTE — ED Notes (Signed)
Pt in xray, upgraded to progressive bed.

## 2022-06-02 NOTE — Progress Notes (Signed)
Initial Nutrition Assessment  DOCUMENTATION CODES:   Not applicable  INTERVENTION:  - Advance diet as medically appropriate.  - Per SLP, patient not alert enough for PO trials today.  - If diet advanced recommend Ensure Plus High Protein po BID, each supplement provides 350 kcal and 20 grams of protein.  - If diet unable to be advanced in 3-5 days, would recommend consideration of enteral nutrition if within patient's goals of care.  - RD to monitor for diet advancement.   NUTRITION DIAGNOSIS:   Inadequate oral intake related to inability to eat as evidenced by NPO status.  GOAL:   Patient will meet greater than or equal to 90% of their needs  MONITOR:   Diet advancement, Weight trends  REASON FOR ASSESSMENT:   Consult Assessment of nutrition requirement/status  ASSESSMENT:   77 y.o. male with PMH of stage 3b CKD, HTN, HLD, PVD, and DM who presented with generalized weakness.  Patient noted to have AMS today and not alert.  Per EMR, no weight history since April 2022 to assess for recent weight changes.  Weight this admission very close to weight back in 2022.  Patient has not been assessed by a [place] since 2021.   Patient currently NPO and per SLP note this afternoon, patient too lethargic for PO trials.   Will monitor for diet advancement. If unable to advance oral diet, may need to consider enteral nutrition if within patient's goals of care.  Of note, palliative care consulted.   Medications reviewed and include: Insulin  Labs reviewed:  No BMP today HA1C 7.1   NUTRITION - FOCUSED PHYSICAL EXAM:  RD working remotely  Diet Order:   Diet Order             Diet NPO time specified  Diet effective now                   EDUCATION NEEDS:  Not appropriate for education at this time  Skin:    No skin assessment yet completed by RN  Last BM:  unknown  Height:  Ht Readings from Last 1 Encounters:  06/01/22 5\' 8"  (1.727 m)    Weight:  Wt Readings from Last 1 Encounters:  06/01/22 84 kg    BMI:  Body mass index is 28.16 kg/m.  Estimated Nutritional Needs:  Kcal:  2100-2350 kcals Protein:  85-100 grams Fluid:  >/= 2.1L    Samson Frederic RD, LDN For contact information, refer to [facility].

## 2022-06-03 ENCOUNTER — Inpatient Hospital Stay (HOSPITAL_COMMUNITY): Payer: Medicare Other

## 2022-06-03 DIAGNOSIS — Z7189 Other specified counseling: Secondary | ICD-10-CM | POA: Diagnosis not present

## 2022-06-03 DIAGNOSIS — G9341 Metabolic encephalopathy: Secondary | ICD-10-CM | POA: Diagnosis not present

## 2022-06-03 DIAGNOSIS — I6389 Other cerebral infarction: Secondary | ICD-10-CM

## 2022-06-03 DIAGNOSIS — F03918 Unspecified dementia, unspecified severity, with other behavioral disturbance: Secondary | ICD-10-CM

## 2022-06-03 DIAGNOSIS — N390 Urinary tract infection, site not specified: Secondary | ICD-10-CM | POA: Diagnosis not present

## 2022-06-03 DIAGNOSIS — J189 Pneumonia, unspecified organism: Secondary | ICD-10-CM | POA: Diagnosis not present

## 2022-06-03 DIAGNOSIS — R4182 Altered mental status, unspecified: Secondary | ICD-10-CM

## 2022-06-03 LAB — COMPREHENSIVE METABOLIC PANEL
ALT: 15 U/L (ref 0–44)
AST: 18 U/L (ref 15–41)
Albumin: 3.2 g/dL — ABNORMAL LOW (ref 3.5–5.0)
Alkaline Phosphatase: 64 U/L (ref 38–126)
Anion gap: 14 (ref 5–15)
BUN: 16 mg/dL (ref 8–23)
CO2: 24 mmol/L (ref 22–32)
Calcium: 8.7 mg/dL — ABNORMAL LOW (ref 8.9–10.3)
Chloride: 105 mmol/L (ref 98–111)
Creatinine, Ser: 1.28 mg/dL — ABNORMAL HIGH (ref 0.61–1.24)
GFR, Estimated: 58 mL/min — ABNORMAL LOW (ref >60)
Glucose, Bld: 127 mg/dL — ABNORMAL HIGH (ref 70–99)
Potassium: 3.4 mmol/L — ABNORMAL LOW (ref 3.5–5.1)
Sodium: 143 mmol/L (ref 135–145)
Total Bilirubin: 1.2 mg/dL (ref 0.3–1.2)
Total Protein: 7.1 g/dL (ref 6.5–8.1)

## 2022-06-03 LAB — LIPID PANEL
Cholesterol: 99 mg/dL (ref 0–200)
HDL: 27 mg/dL — ABNORMAL LOW (ref >40)
LDL Cholesterol: 54 mg/dL (ref 0–99)
Total CHOL/HDL Ratio: 3.7 RATIO
Triglycerides: 90 mg/dL (ref <150)
VLDL: 18 mg/dL (ref 0–40)

## 2022-06-03 LAB — CBC
HCT: 34.9 % — ABNORMAL LOW (ref 39.0–52.0)
Hemoglobin: 10.8 g/dL — ABNORMAL LOW (ref 13.0–17.0)
MCH: 28.8 pg (ref 26.0–34.0)
MCHC: 30.9 g/dL (ref 30.0–36.0)
MCV: 93.1 fL (ref 80.0–100.0)
Platelets: 210 10*3/uL (ref 150–400)
RBC: 3.75 MIL/uL — ABNORMAL LOW (ref 4.22–5.81)
RDW: 14.9 % (ref 11.5–15.5)
WBC: 7.5 10*3/uL (ref 4.0–10.5)
nRBC: 0 % (ref 0.0–0.2)

## 2022-06-03 LAB — GLUCOSE, CAPILLARY
Glucose-Capillary: 96 mg/dL (ref 70–99)
Glucose-Capillary: 126 mg/dL — ABNORMAL HIGH (ref 70–99)
Glucose-Capillary: 183 mg/dL — ABNORMAL HIGH (ref 70–99)
Glucose-Capillary: 166 mg/dL — ABNORMAL HIGH (ref 70–99)

## 2022-06-03 LAB — ECHOCARDIOGRAM COMPLETE
Calc EF: 67.2 %
Height: 68 in
S' Lateral: 2.4 cm
Single Plane A2C EF: 63 %
Single Plane A4C EF: 70.2 %
Weight: 2962.98 oz

## 2022-06-03 LAB — TSH: TSH: 3.668 u[IU]/mL (ref 0.350–4.500)

## 2022-06-03 LAB — VITAMIN B12: Vitamin B-12: 658 pg/mL (ref 180–914)

## 2022-06-03 LAB — AMMONIA: Ammonia: 16 umol/L (ref 9–35)

## 2022-06-03 MED ORDER — CLOPIDOGREL BISULFATE 75 MG PO TABS
75.0000 mg | ORAL_TABLET | Freq: Every day | ORAL | Status: DC
  Administered 2022-06-03 – 2022-06-05 (×3): 75 mg via ORAL
  Filled 2022-06-03 (×2): qty 1

## 2022-06-03 MED ORDER — CARVEDILOL 12.5 MG PO TABS
12.5000 mg | ORAL_TABLET | Freq: Two times a day (BID) | ORAL | Status: DC
  Administered 2022-06-03 – 2022-06-05 (×4): 12.5 mg via ORAL
  Filled 2022-06-03 (×4): qty 1

## 2022-06-03 MED ORDER — ASPIRIN 81 MG PO TBEC
81.0000 mg | DELAYED_RELEASE_TABLET | Freq: Every day | ORAL | Status: DC
  Administered 2022-06-04 – 2022-06-05 (×2): 81 mg via ORAL
  Filled 2022-06-03 (×2): qty 1

## 2022-06-03 MED ORDER — HYDRALAZINE HCL 20 MG/ML IJ SOLN
10.0000 mg | INTRAMUSCULAR | Status: DC | PRN
  Administered 2022-06-03: 10 mg via INTRAVENOUS
  Filled 2022-06-03: qty 1

## 2022-06-03 NOTE — Evaluation (Addendum)
Occupational Therapy Evaluation Patient Details Name: Harry [name] MRN: [id number] DOB: 1945/05/20 Today's Date: 06/03/2022   History of Present Illness Pt is a 77 y.o. male presenting 3/29 presenting from SNF to ED with AMS, slurred speech, and questionable L sided facial droop. Pt found to have abnormal UA. 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 Additionally found to have PNA. PMH includes: HTN, HLD, DM II, CVA, PVD, CKD III, and dementia.   Clinical Impression   PTA, pt at [facility] and received assistance with all ADL as well as transfers to a wheelchair. Upon eval, pt performing UB ADL with min-mod A and LB ADL with total A. Pt performing basic transfers with total A +2 this session. Pt with good static sitting balance EOB. Pt with consistent overshooting with all reaching and self care tasks (washing face, reaching to therapist, etc.). Pt following basic commands with increased time. Presenting with decreased strength, balance, LE mobility, safety, and memory. Recommending contiued OT services <3 hours per day to optimize participation and independence in ADL.      Recommendations for follow up therapy are one component of a multi-disciplinary discharge planning process, led by the attending physician.  Recommendations may be updated based on patient status, additional functional criteria and insurance authorization.   Assistance Recommended at Discharge Frequent or constant Supervision/Assistance  Patient can return home with the following Two people to help with walking and/or transfers;Two people to help with bathing/dressing/bathroom;Assistance with cooking/housework;Assist for transportation;Help with stairs or ramp for entrance;Direct supervision/assist for financial management;Direct supervision/assist for medications management;Assistance with feeding    Functional Status Assessment  Patient has had a recent  decline in their functional status and/or demonstrates limited ability to make significant improvements in function in a reasonable and predictable amount of time  Equipment Recommendations  Other (comment) (defer)    Recommendations for Other Services       Precautions / Restrictions Precautions Precautions: Fall Restrictions Weight Bearing Restrictions: No      Mobility Bed Mobility Overal bed mobility: Needs Assistance Bed Mobility: Rolling, Sidelying to Sit Rolling: Min assist Sidelying to sit: Max assist       General bed mobility comments: Min A for guidance during roll. Mod A to elevate trunk and assist to brig BLE off EOB.    Transfers Overall transfer level: Needs assistance Equipment used: 2 person hand held assist Transfers: Bed to chair/wheelchair/BSC, Sit to/from Stand Sit to Stand: Total assist, +2 physical assistance, +2 safety/equipment   Squat pivot transfers: Total assist, +2 physical assistance, +2 safety/equipment       General transfer comment: Pt with poor initiation of attempt to stand with two person hand held assist. Unable to rise to fully upright position, and requiring max blocking at BLE. Pt with greater initiation of squat pivot reaching to opposite arm of chair without cues initially, but continues to ultimately require total A for lifting      Balance Overall balance assessment: Needs assistance Sitting-balance support: Single extremity supported, No upper extremity supported, Feet supported Sitting balance-Leahy Scale: Fair Sitting balance - Comments: ststically. Poor dynamically   Standing balance support: Bilateral upper extremity supported, During functional activity Standing balance-Leahy Scale: Zero Standing balance comment: total A for standing                           ADL either performed or assessed with clinical judgement   ADL  Overall ADL's : Needs assistance/impaired Eating/Feeding: Minimal assistance;Cueing  for sequencing;Bed level   Grooming: Wash/dry face;Set up;Sitting;Cueing for sequencing Grooming Details (indicate cue type and reason): Pt overshootig toward L with attempt to wash face, and requiring two hands to get hands to face despite good ROM at both shoulders and elbows. Upper Body Bathing: Sitting;Cueing for sequencing;Minimal assistance;Moderate assistance   Lower Body Bathing: Maximal assistance;Sitting/lateral leans;Total assistance   Upper Body Dressing : Sitting;Moderate assistance;Cueing for sequencing   Lower Body Dressing: Total assistance;Sitting/lateral leans   Toilet Transfer: Total assistance;+2 for physical assistance;+2 for safety/equipment;Squat-pivot;BSC/3in1   Toileting- Clothing Manipulation and Hygiene: Total assistance       Functional mobility during ADLs: Total assistance;+2 for physical assistance;+2 for safety/equipment;Maximal assistance       Vision Ability to See in Adequate Light: 2 Moderately impaired Patient Visual Report: No change from baseline Vision Assessment?: Vision impaired- to be further tested in functional context Additional Comments: consistent overshooting to L side of own body and R side when reaching toward therapist during session. Per chart, L eye blindness. Pt unable to report anything on condition on new changes in his vision.     Perception     Praxis      Pertinent Vitals/Pain       Hand Dominance Right   Extremity/Trunk Assessment Upper Extremity Assessment Upper Extremity Assessment: Generalized weakness   Lower Extremity Assessment Lower Extremity Assessment: Defer to PT evaluation       Communication Communication Communication: Expressive difficulties (slowed rate of communication, sometimes muffled.)   Cognition Arousal/Alertness: Awake/alert Behavior During Therapy: Flat affect Overall Cognitive Status: History of cognitive impairments - at baseline                                  General Comments: Following basic commands with incresaed time. Significantly incresed time for all processsing. Cues for safety and sequencing throughout most of session, but does demonstrate some carryover of transfers/reasoning with pt self-initiating reaching to opposite arm of chair to be transferred to.     General Comments  VSS    Exercises     Shoulder Instructions      Home Living Family/patient expects to be discharged to:: Skilled nursing facility                                 Additional Comments: Was at [facility] term [facility].      Prior Functioning/Environment Prior Level of Function : Needs assist             Mobility Comments: Recently, staff has been getting pt to a wheelchair. Pt reports that staff helps him stand and turn or lifts him. Daughter present at end of session unable to provide any insight. ADLs Comments: Pt reports that staff assists with bathing and dressing        OT Problem List: Decreased strength;Impaired balance (sitting and/or standing);Decreased range of motion;Decreased activity tolerance;Decreased cognition;Decreased coordination;Decreased safety awareness;Decreased knowledge of use of DME or AE      OT Treatment/Interventions: Self-care/ADL training;Therapeutic exercise;Balance training;Patient/family education;Therapeutic activities;Cognitive remediation/compensation;DME and/or AE instruction    OT Goals(Current goals can be found in the care plan section) Acute Rehab OT Goals Patient Stated Goal: none stated OT Goal Formulation: With patient Time For Goal Achievement: 06/17/22 Potential to Achieve Goals: Fair ADL Goals Pt Will Perform Grooming:  with set-up;sitting Pt Will Perform Upper Body Bathing: with set-up;sitting Pt Will Perform Lower Body Bathing: with mod assist;sitting/lateral leans Pt Will Transfer to Toilet: with +2 assist;with mod assist;squat pivot transfer;bedside commode  OT Frequency:  Min 2X/week    Co-evaluation              AM-PAC OT "6 Clicks" Daily Activity     Outcome Measure Help from another person eating meals?: A Little Help from another person taking care of personal grooming?: A Little Help from another person toileting, which includes using toliet, bedpan, or urinal?: Total Help from another person bathing (including washing, rinsing, drying)?: A Lot Help from another person to put on and taking off regular upper body clothing?: A Little Help from another person to put on and taking off regular lower body clothing?: Total 6 Click Score: 13   End of Session Equipment Utilized During Treatment: Gait belt Nurse Communication: Mobility status  Activity Tolerance: Patient tolerated treatment well Patient left: in chair;with call bell/phone within reach;with chair alarm set;with family/visitor present (RN sitting outside of room)  OT Visit Diagnosis: Unsteadiness on feet (R26.81);Muscle weakness (generalized) (M62.81);Other abnormalities of gait and mobility (R26.89);Other symptoms and signs involving cognitive function                Time: [id number] OT Time Calculation (min): 35 min Charges:  OT General Charges $OT Visit: 1 Visit OT Evaluation $OT Eval Moderate Complexity: 1 Mod  Elder Cyphers, OTR/L [facility] Acute Rehabilitation Office: [phone]   Magnus Ivan 06/03/2022, 2:29 PM

## 2022-06-03 NOTE — Progress Notes (Signed)
PROGRESS NOTE    Harry [name]  [id number] DOB: [birth year] DOA: 06/01/2022 PCP: System, Provider Not In    Brief Narrative:  Patient is 77 year old with history of CKD stage IIIb, essential hypertension, hyperlipidemia, peripheral vascular disease status post stents, type 2 diabetes, probable vascular dementia, nursing home resident for the last 2 years brought to the emergency room with garbled speech and not answering questions appropriately.  Unclear last well-known.  In the emergency room hemodynamically stable.  Lethargic.  Slightly elevated lactic acid.  CT scans with chronic changes, MRI with punctate acute infarct in the cerebellar vermis and parafalcine inferior right frontal lobe.  Admitted with acute stroke.  UA was abnormal, however without bacteria.  Also started on Rocephin.   Assessment & Plan:   Acute stroke in a patient with chronic vascular dementia, previous multiple strokes. Acute metabolic encephalopathy likely secondary to stroke and underlying dementia.  Clinical findings, garbled speech.  Less responsiveness. CT head findings, no acute findings.  Atrophy and chronic microvascular ischemic changes. MRI of the brain, punctate acute infarcts in the cerebellar vermis and parafalcine impaired right frontal lobe. CT angiogram of the head neck, no large vessel occlusion.  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 2D echocardiogram, pending today. Antiplatelet therapy, on aspirin and Plavix at home.  Resume today as he is able to take by mouth. LDL 54.  Below the goal. Hemoglobin A1c, pending. B12, TSH and ammonia levels normal. Speech, started on dysphagia 3 diet, continue with all aspiration precautions. PT OT, yet to work with PT OT.  Likely not rehab candidate. Neurology following.  Further management as per neurology.  Acute metabolic infective encephalopathy, suspected UTI: Less likely.  Continue  Rocephin until final cultures. Continue maintenance IV fluids, no adequate oral intake.  Fall precautions.  Delirium precautions.  Essential hypertension: Blood pressure is elevated today.  Gradually controlled blood pressure.  Goal is 180 or above.  Resume Coreg.  Hydralazine as needed to keep blood pressures at or above 180.  AKI on CKD stage IIIa: Improved overnight with IV fluid hydration.  Urine output is adequate.  Type 2 diabetes, unknown control.  A1c pending.  Keep on sliding scale insulin.  Goal of care Patient with advanced dementia that is probably vascular dementia, mostly bedbound for last 2 years.  Now with acute stroke.  Poor rehab candidate.  Detailed discussion and clinical update provided to the patient's daughter.  Appropriately emotional and overwhelmed with father's diagnosis. Recommended to have continued palliative care discussions. If adequate oral intake, he will go back to long-term nursing home. Appreciate palliative care follow-up.     DVT prophylaxis: enoxaparin (LOVENOX) injection 40 mg Start: 06/02/22 0830   Code Status: Full code Family Communication: Daughter Ms. Harry [name] on the phone Disposition Plan: Status is: Inpatient Remains inpatient appropriate because: Acuity stroke, workup     Consultants:  Neurology Palliative care  Procedures:  None  Antimicrobials:  Rocephin 3/30---   Subjective: Patient seen in the morning rounds.  He was slightly awake but not fully conversant or able to have interaction.  Remains quite lethargic. Speech therapy was able to safely administer tests, he was not awake enough for interaction.  Objective: Vitals:   06/03/22 0327 06/03/22 0815 06/03/22 1300 06/03/22 1400  BP: (!) 151/73 (!) 179/90 (!) 206/120 (!) 180/62  Pulse: 98 85    Resp: 16 16    Temp: 98.4 F (36.9 C) 97.7 F (  36.5 C)    TempSrc:  Axillary    SpO2: 100% 100%    Weight:      Height:        Intake/Output Summary (Last 24 hours) at  06/03/2022 1417 Last data filed at 06/03/2022 1200 Gross per 24 hour  Intake 1419.88 ml  Output 450 ml  Net 969.88 ml   Filed Weights   06/01/22 2058  Weight: 84 kg    Examination:  General exam: Appears frail debilitated.  Chronically sick looking.  Not in any distress. Respiratory system: No added sounds. Cardiovascular system: S1-S2 normal.  No added sounds.   Gastrointestinal system: Soft and nontender. Central nervous system:  Lethargic.  Difficult to keep awake.  Unable to follow commands, mumbles few words, localizes to stimuli.  He does move all extremities, his grip is strong on both hands.    Data Reviewed: I have personally reviewed following labs and imaging studies  CBC: Recent Labs  Lab 06/01/22 2119 06/01/22 2134 06/03/22 1227  WBC 13.7*  --  7.5  NEUTROABS 11.4*  --   --   HGB 10.7* 11.6* 10.8*  HCT 32.8* 34.0* 34.9*  MCV 90.6  --  93.1  PLT 205  --  [id number]   Basic Metabolic Panel: Recent Labs  Lab 06/01/22 2119 06/01/22 2134 06/03/22 1227  NA 141 143 143  K 3.4* 3.3* 3.4*  CL 102  --  105  CO2 26  --  24  GLUCOSE 277*  --  127*  BUN 19  --  16  CREATININE 1.83*  --  1.28*  CALCIUM 8.8*  --  8.7*   GFR: Estimated Creatinine Clearance: 52.6 mL/min (A) (by C-G formula based on SCr of 1.28 mg/dL (H)). Liver Function Tests: Recent Labs  Lab 06/01/22 2119 06/03/22 1227  AST 22 18  ALT 17 15  ALKPHOS 60 64  BILITOT 1.1 1.2  PROT 6.6 7.1  ALBUMIN 3.0* 3.2*   No results for input(s): "LIPASE", "AMYLASE" in the last 168 hours. Recent Labs  Lab 06/03/22 1227  AMMONIA 16   Coagulation Profile: Recent Labs  Lab 06/01/22 2147  INR 1.3*   Cardiac Enzymes: No results for input(s): "CKTOTAL", "CKMB", "CKMBINDEX", "TROPONINI" in the last 168 hours. BNP (last 3 results) No results for input(s): "PROBNP" in the last 8760 hours. HbA1C: No results for input(s): "HGBA1C" in the last 72 hours. CBG: Recent Labs  Lab 06/02/22 1205  06/02/22 1711 06/02/22 2119 06/03/22 0612 06/03/22 1134  GLUCAP 185* 87 73 96 126*   Lipid Profile: Recent Labs    06/03/22 1227  CHOL 99  HDL 27*  LDLCALC 54  TRIG 90  CHOLHDL 3.7   Thyroid Function Tests: Recent Labs    06/03/22 1227  TSH 3.668   Anemia Panel: Recent Labs    06/03/22 1227  VITAMINB12 658   Sepsis Labs: Recent Labs  Lab 06/01/22 2138 06/02/22 0232 06/02/22 0959 06/02/22 1310  LATICACIDVEN 1.7 2.5* 1.2 1.3    Recent Results (from the past 240 hour(s))  Blood Culture (routine x 2)     Status: None (Preliminary result)   Collection Time: 06/01/22  9:43 PM   Specimen: BLOOD RIGHT FOREARM  Result Value Ref Range Status   Specimen Description BLOOD RIGHT FOREARM  Final   Special Requests   Final    BOTTLES DRAWN AEROBIC AND ANAEROBIC Blood Culture adequate volume   Culture   Final    NO GROWTH 2 DAYS Performed at [facility], [place] [address], [place], [place] [address]    Report Status PENDING  Incomplete  Blood Culture (routine x 2)     Status: None (Preliminary result)   Collection Time: 06/01/22  9:47 PM   Specimen: BLOOD  Result Value Ref Range Status   Specimen Description BLOOD RIGHT ANTECUBITAL  Final   Special Requests   Final    BOTTLES DRAWN AEROBIC AND ANAEROBIC Blood Culture adequate volume   Culture   Final    NO GROWTH 2 DAYS Performed at [facility], [place] [address], [place], [place] [address]    Report Status PENDING  Incomplete  SARS Coronavirus 2 by RT PCR (hospital order, performed in [facility] hospital lab) *cepheid single result test* Anterior Nasal Swab     Status: None   Collection Time: 06/01/22 10:39 PM   Specimen: Anterior Nasal Swab  Result Value Ref Range Status   SARS Coronavirus 2 by RT PCR NEGATIVE NEGATIVE Final    Comment: Performed at [facility], [place] [address], [place], [place] [address]  Urine Culture (for pregnant, neutropenic or urologic patients or patients with an indwelling  urinary catheter)     Status: Abnormal (Preliminary result)   Collection Time: 06/02/22  4:43 AM   Specimen: Urine, Clean Catch  Result Value Ref Range Status   Specimen Description URINE, CLEAN CATCH  Final   Special Requests NONE  Final   Culture (A)  Final    80,000 COLONIES/mL ESCHERICHIA COLI SUSCEPTIBILITIES TO FOLLOW Performed at [facility], [place] [address], [place], [place] [address]    Report Status PENDING  Incomplete         Radiology Studies: MR BRAIN WO CONTRAST  Result Date: 06/02/2022 CLINICAL DATA:  Stroke follow-up EXAM: MRI HEAD WITHOUT CONTRAST TECHNIQUE: Multiplanar, multiecho pulse sequences of the brain and surrounding structures were obtained without intravenous contrast. COMPARISON:  None Available. FINDINGS: Brain: Small punctate focus of acute infarct in the cerebellar vermis (series 5, image 67) in in the parafalcine inferior right frontal lobe (series 5, image 73). Redemonstrated are chronic infarcts in the bilateral cerebellar hemispheres, brainstem, right thalamus, in in the medial left occipital lobe. Sequela of severe chronic microvascular ischemic change. No hydrocephalus. No extra-axial fluid collection. There are small punctate foci of microhemorrhage in the bilateral cerebellar hemispheres, and the thalami, possibly hypertensive in etiology. Advanced generalized volume loss. Vascular: Normal flow voids. Skull and upper cervical spine: Normal marrow signal. Sinuses/Orbits: No mastoid or middle ear effusion. Bilateral lens replacement. Orbits are otherwise unremarkable. Mild pansinus mucosal thickening, greatest in the bilateral ethmoid sinuses. Other: None. IMPRESSION: Punctate acute infarcts in the cerebellar vermis and parafalcine inferior right frontal lobe. No hemorrhage. Electronically Signed   By: Marin Roberts M.D.   On: 06/02/2022 11:20   DG Hip Unilat W or Wo Pelvis 2-3 Views Left  Result Date: 06/02/2022 CLINICAL DATA:  clearance for MRI.  EXAM: DG HIP (WITH OR WITHOUT PELVIS) 2-3V LEFT COMPARISON:  None Available. FINDINGS: No radiopaque or metallic foreign bodies identified within the pelvis or left hip. Mild degenerative changes noted within both hip joints. No acute fracture or dislocation. Contrast material noted within the bladder. IMPRESSION: 1. No radiopaque or metallic foreign bodies identified. 2. Mild bilateral hip osteoarthritis. Electronically Signed   By: Kerby Moors M.D.   On: 06/02/2022 10:03   CT Chest Wo Contrast  Result Date: 06/02/2022 CLINICAL DATA:  77 year old male with history of chest wall  pain. EXAM: CT CHEST WITHOUT CONTRAST TECHNIQUE: Multidetector CT imaging of the chest was performed following the standard protocol without IV contrast. RADIATION DOSE REDUCTION: This exam was performed according to the departmental dose-optimization program which includes automated exposure control, adjustment of the mA and/or kV according to patient size and/or use of iterative reconstruction technique. COMPARISON:  No priors. FINDINGS: Comment: Limited study secondary to the patient being imaged in the left lateral decubitus position. Cardiovascular: Heart size is normal. There is no significant pericardial fluid, thickening or pericardial calcification. Atherosclerotic calcifications of the left anterior descending, left circumflex and right coronary arteries. Mediastinum/Nodes: No pathologically enlarged mediastinal or hilar lymph nodes. Please note that accurate exclusion of hilar adenopathy is limited on noncontrast CT scans. Esophagus is unremarkable in appearance. No axillary lymphadenopathy. Lungs/Pleura: Low volume left lung secondary to patient positioning with apparent areas of subsegmental atelectasis throughout the left lung. No definite confluent consolidative airspace disease. No pleural effusions. Poorly defined nodule in the periphery of the right upper lobe (axial image 70 of series 8) measuring 1.0 x 0.5 cm.  Upper Abdomen: High attenuation material in the collecting systems of both kidneys related to recent contrast-enhanced head CTA. Aortic atherosclerosis. Musculoskeletal: There are no aggressive appearing lytic or blastic lesions noted in the visualized portions of the skeleton. IMPRESSION: 1. No acute findings in the thorax to account for the patient's symptoms on today's limited examination. 2. Extensive dependent atelectasis in the left lung likely secondary to patient positioning. 3. Aortic atherosclerosis, in addition to three-vessel coronary artery disease. Assessment for potential risk factor modification, dietary therapy or pharmacologic therapy may be warranted, if clinically indicated. Aortic Atherosclerosis (ICD10-I70.0). Electronically Signed   By: Vinnie Langton M.D.   On: 06/02/2022 06:45   CT ANGIO HEAD NECK W WO CM  Result Date: 06/02/2022 CLINICAL DATA:  Left-sided weakness and dysarthria EXAM: CT ANGIOGRAPHY HEAD AND NECK TECHNIQUE: Multidetector CT imaging of the head and neck was performed using the standard protocol during bolus administration of intravenous contrast. Multiplanar CT image reconstructions and MIPs were obtained to evaluate the vascular anatomy. Carotid stenosis measurements (when applicable) are obtained utilizing NASCET criteria, using the distal internal carotid diameter as the denominator. RADIATION DOSE REDUCTION: This exam was performed according to the departmental dose-optimization program which includes automated exposure control, adjustment of the mA and/or kV according to patient size and/or use of iterative reconstruction technique. CONTRAST:  77mL OMNIPAQUE IOHEXOL 350 MG/ML SOLN COMPARISON:  MRA head 08/12/2018 FINDINGS: CT HEAD FINDINGS Brain: There is no mass, hemorrhage or extra-axial collection. Generalized volume loss. Old bilateral cerebellar and left occipital infarcts. There is hypoattenuation of the periventricular white matter, most commonly indicating  chronic ischemic microangiopathy. Skull: The visualized skull base, calvarium and extracranial soft tissues are normal. Sinuses/Orbits: No fluid levels or advanced mucosal thickening of the visualized paranasal sinuses. No mastoid or middle ear effusion. The orbits are normal. CTA NECK FINDINGS SKELETON: There is no bony spinal canal stenosis. No lytic or blastic lesion. OTHER NECK: Normal pharynx, larynx and major salivary glands. No cervical lymphadenopathy. Unremarkable thyroid gland. UPPER CHEST: No pneumothorax or pleural effusion. No nodules or masses. AORTIC ARCH: There is calcific atherosclerosis of the aortic arch. There is no aneurysm, dissection or hemodynamically significant stenosis of the visualized portion of the aorta. Conventional 3 vessel aortic branching pattern. The visualized proximal subclavian arteries are widely patent. RIGHT CAROTID SYSTEM: No dissection, occlusion or aneurysm. Mild atherosclerotic calcification at the carotid bifurcation without hemodynamically significant stenosis. LEFT  CAROTID SYSTEM: No dissection, occlusion or aneurysm. Mild atherosclerotic calcification at the carotid bifurcation without hemodynamically significant stenosis. VERTEBRAL ARTERIES: Left dominant configuration. Both origins are clearly patent. There is no dissection, occlusion or flow-limiting stenosis to the skull base (V1-V3 segments). CTA HEAD FINDINGS POSTERIOR CIRCULATION: --Vertebral arteries: Normal V4 segments. --Inferior cerebellar arteries: Normal. --Basilar artery: Normal. --Superior cerebellar arteries: Normal. --Posterior cerebral arteries (PCA): Unchanged severe stenosis of the distal right P1 segment ANTERIOR CIRCULATION: --Intracranial internal carotid arteries: There is severe, near occlusive stenosis of the cavernous segment the right ICA. Atherosclerotic calcification of the left ICA without high-grade stenosis. --Anterior cerebral arteries (ACA): Normal. Both A1 segments are present.  Patent anterior communicating artery (a-comm). --Middle cerebral arteries (MCA): Mild stenosis of the right MCA M2 segment superior division. Left MCA normal. ANATOMIC VARIANTS: None Review of the MIP images confirms the above findings. IMPRESSION: 1. No emergent large vessel occlusion. 2. 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 Allowing for differences in modality, this appears unchanged compared to the 08/12/2018 MRA. 3. Old bilateral cerebellar and left occipital infarcts. Aortic atherosclerosis (ICD10-I70.0). Electronically Signed   By: Ulyses Jarred M.D.   On: 06/02/2022 04:02   DG Chest Portable 1 View  Result Date: 06/01/2022 CLINICAL DATA:  Cough EXAM: PORTABLE CHEST 1 VIEW COMPARISON:  06/06/2020 FINDINGS: Heart and mediastinal contours within normal limits. Increased markings in the lung bases, left greater than right. No effusions. No acute bony abnormality. IMPRESSION: Increased markings in the lung bases could reflect atelectasis or early infiltrates. Electronically Signed   By: Rolm Baptise M.D.   On: 06/01/2022 22:02        Scheduled Meds:  aspirin  300 mg Rectal Daily   Or   aspirin  325 mg Oral Daily   carvedilol  12.5 mg Oral BID WC   enoxaparin (LOVENOX) injection  40 mg Subcutaneous Q24H   insulin aspart  0-15 Units Subcutaneous TID WC   insulin aspart  0-5 Units Subcutaneous QHS   Continuous Infusions:  sodium chloride 50 mL/hr at 06/03/22 1200   cefTRIAXone (ROCEPHIN)  IV 1 g (06/02/22 2145)     LOS: 1 day    Time spent: 40 minutes    Barb Merino, MD [facility] Pager [phone]
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
## 2022-06-03 NOTE — Progress Notes (Signed)
Daily Progress Note   Patient Name: Harry [name]       Date: 06/03/2022 DOB: [birth year]  Age: 77 y.o. MRN#: [id number] Attending Physician: Barb Merino, MD Primary Care Physician: System, Provider Not In Admit Date: 06/01/2022  Reason for Consultation/Follow-up: Establishing goals of care  Subjective: Medical records reviewed including progress notes, labs, imaging. Patient assessed at the bedside.  He denies pain or distress, tells me he is very tired.  He is disoriented.  No family present during my visit. Questions and concerns addressed. PMT will continue to support holistically.  I then called patient's daughter [name] to provide ongoing palliative support.  Provided her with update on my interaction with her father today.  She confirms that she has spoken with vascular surgery, however she still would like to speak with neurology.  Offered to advocate for this and she is appreciative.  She confirms her current desire to continue trying everything possible and giving the patient the opportunity to improve.  Questions and concerns addressed.  PMT will continue to follow and support.  Length of Stay: 1   Physical Exam Vitals and nursing note reviewed.  Constitutional:      General: He is not in acute distress.    Appearance: He is ill-appearing.     Interventions: Nasal cannula in place.     Comments: 3L  Cardiovascular:     Rate and Rhythm: Normal rate.  Pulmonary:     Effort: Pulmonary effort is normal.  Neurological:     Mental Status: He is lethargic.            Vital Signs: BP (!) 179/90 (BP Location: Right Arm) Comment: MD aware, will put PRN  Pulse 85   Temp 97.7 F (36.5 C) (Axillary)   Resp 16   Ht 5\' 8"  (1.727 m)   Wt 84 kg   SpO2 100%   BMI 28.16 kg/m   SpO2: SpO2: 100 % O2 Device: O2 Device: Room Air O2 Flow Rate: O2 Flow Rate (L/min): 3 L/min      Palliative Assessment/Data: 10%   Palliative Care Assessment & Plan   Patient Profile: 77 y.o. male  with past medical history of stage 3b CKD, HTN, HLD, PVD s/p stent, stroke, and DM  presented to ED on 06/01/22 from [facility] with  staff concerns of AMS, slurred speech, and left facial droop. MRI revealed 2 punctate infarcts in the right frontal lobe and cerebellum. Patient was admitted on 06/01/2022 with acute metabolic encephalopathy, abnormal UA, possible PNA, and stroke.   Assessment: Goals of care conversation Acute punctate ischemic infarcts Acute cystitis Uncontrolled DM2 PAD status post stent Acute metabolic encephalopathy  Recommendations/Plan: Continue full code/full scope treatment Patient's daughter confirms desire for any interventions that could prolong patient's life Ongoing goals of care discussions PMT will continue to follow and support   Prognosis:  Unable to determine  Discharge Planning: To Be Determined  Care plan was discussed with patient, patient's daughter   Total time: I spent 35 minutes in the care of the patient today in the above activities and documenting the encounter.           Jadzia Ibsen Johnnette Litter, PA-C  Palliative Medicine Team Team phone # [phone]  Thank you for allowing the Palliative Medicine Team to assist in the care of this patient. Please utilize secure chat with additional questions, if there is no response within 30 minutes please call the above phone number.  Palliative Medicine Team providers are available by phone from 7am to 7pm daily and can be reached through the team cell phone.  Should this patient require assistance outside of these hours, please call the patient's attending physician.

## 2022-06-03 NOTE — Evaluation (Signed)
Physical Therapy Evaluation Patient Details Name: Harry [name] MRN: [id number] DOB: 09-18-1945 Today's Date: 06/03/2022  History of Present Illness  Pt is a 77 y.o. male presenting 3/29 presenting from SNF to ED with AMS, slurred speech, and questionable L sided facial droop. Pt found to have abnormal UA. 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 Additionally found to have PNA. PMH includes: HTN, HLD, DM II, CVA, PVD, CKD III, and dementia.   Clinical Impression  Pt in bed upon arrival of PT, agreeable to evaluation at this time. Prior to admission the pt was dependent on assist from staff at [facility] to complete transfers to and from [facility], and reports assist with ADLs from staff as well. The pt presents with limited ROM and strength in BLE, and required totalA of 2 to complete squat pivot from bed-chair due to weakness. The pt will continue to benefit from skilled PT acutely as well as return to [facility] for continued rehab to maximize return of function and decrease caregiver burden.    Recommendations for follow up therapy are one component of a multi-disciplinary discharge planning process, led by the attending physician.  Recommendations may be updated based on patient status, additional functional criteria and insurance authorization.  Follow Up Recommendations Can patient physically be transported by private vehicle: No     Assistance Recommended at Discharge Frequent or constant Supervision/Assistance  Patient can return home with the following  Two people to help with walking and/or transfers;Two people to help with bathing/dressing/bathroom;Assistance with cooking/housework;Direct supervision/assist for medications management;Direct supervision/assist for financial management;Assist for transportation;Help with stairs or ramp for entrance    Equipment Recommendations None recommended by PT  Recommendations for Other  Services       Functional Status Assessment Patient has had a recent decline in their functional status and demonstrates the ability to make significant improvements in function in a reasonable and predictable amount of time.     Precautions / Restrictions Precautions Precautions: Fall Restrictions Weight Bearing Restrictions: No      Mobility  Bed Mobility Overal bed mobility: Needs Assistance Bed Mobility: Rolling, Sidelying to Sit Rolling: Min assist Sidelying to sit: Max assist       General bed mobility comments: Min A for guidance during roll. Mod A to elevate trunk and assist to brig BLE off EOB.    Transfers Overall transfer level: Needs assistance Equipment used: 2 person hand held assist Transfers: Bed to chair/wheelchair/BSC, Sit to/from Stand Sit to Stand: Total assist, +2 physical assistance, +2 safety/equipment     Squat pivot transfers: Total assist, +2 physical assistance, +2 safety/equipment     General transfer comment: Pt with poor initiation of attempt to stand with two person hand held assist. Unable to rise to fully upright position, and requiring max blocking at BLE. Pt with greater initiation of squat pivot reaching to opposite arm of chair without cues initially, but continues to ultimately require total A for lifting    Ambulation/Gait               General Gait Details: limited to squat-pivot with pt unable to manage steps at this time  Modified Rankin (Stroke Patients Only) Modified Rankin (Stroke Patients Only) Pre-Morbid Rankin Score: Moderately severe disability Modified Rankin: Severe disability     Balance Overall balance assessment: Needs assistance Sitting-balance support: Single extremity supported, No upper extremity supported, Feet supported Sitting balance-Leahy Scale: Fair Sitting balance - Comments: ststically. Poor  dynamically   Standing balance support: Bilateral upper extremity supported, During functional  activity Standing balance-Leahy Scale: Zero Standing balance comment: total A for standing                             Pertinent Vitals/Pain Pain Assessment Pain Assessment: No/denies pain    Home Living Family/patient expects to be discharged to:: Skilled nursing facility                   Additional Comments: Was at long term care at [facility] place.    Prior Function Prior Level of Function : Needs assist             Mobility Comments: Recently, staff has been getting pt to a wheelchair. Pt reports that staff helps him stand and turn or lifts him. Daughter present at end of session unable to provide any insight on transfer technique ADLs Comments: Pt reports that staff assists with bathing and dressing     Hand Dominance   Dominant Hand: Right    Extremity/Trunk Assessment   Upper Extremity Assessment Upper Extremity Assessment: Defer to OT evaluation    Lower Extremity Assessment Lower Extremity Assessment: Generalized weakness (limited knee extension both with PROM and AROM, limited movement against gravity bilaterally)    Cervical / Trunk Assessment Cervical / Trunk Assessment: Kyphotic  Communication   Communication: Expressive difficulties (slowed rate of communication, sometimes muffled.)  Cognition Arousal/Alertness: Awake/alert Behavior During Therapy: Flat affect Overall Cognitive Status: History of cognitive impairments - at baseline                                 General Comments: Following basic commands with incresaed time. Significantly incresed time for all processsing. Cues for safety and sequencing throughout most of session, but does demonstrate some carryover of transfers/reasoning with pt self-initiating reaching to opposite arm of chair to be transferred to.        General Comments General comments (skin integrity, edema, etc.): VSS on RA    Exercises     Assessment/Plan    PT Assessment Patient needs  continued PT services  PT Problem List Decreased strength;Decreased range of motion;Decreased activity tolerance;Decreased balance;Decreased mobility       PT Treatment Interventions DME instruction;Gait training;Stair training;Functional mobility training;Therapeutic activities;Therapeutic exercise;Balance training;Neuromuscular re-education    PT Goals (Current goals can be found in the Care Plan section)  Acute Rehab PT Goals Patient Stated Goal: return to rehab at [facility] place PT Goal Formulation: With patient Time For Goal Achievement: 06/17/22 Potential to Achieve Goals: Good    Frequency Min 3X/week     Co-evaluation PT/OT/SLP Co-Evaluation/Treatment: Yes Reason for Co-Treatment: Complexity of the patient's impairments (multi-system involvement);Necessary to address cognition/behavior during functional activity;For patient/therapist safety;To address functional/ADL transfers PT goals addressed during session: Mobility/safety with mobility;Balance;Proper use of DME;Strengthening/ROM         AM-PAC PT "6 Clicks" Mobility  Outcome Measure Help needed turning from your back to your side while in a flat bed without using bedrails?: A Lot Help needed moving from lying on your back to sitting on the side of a flat bed without using bedrails?: A Lot Help needed moving to and from a bed to a chair (including a wheelchair)?: Total Help needed standing up from a chair using your arms (e.g., wheelchair or bedside chair)?: Total Help needed to walk in hospital  room?: Total Help needed climbing 3-5 steps with a railing? : Total 6 Click Score: 8    End of Session Equipment Utilized During Treatment: Gait belt Activity Tolerance: Patient tolerated treatment well Patient left: in chair;with call bell/phone within reach;with chair alarm set;with family/visitor present Nurse Communication: Mobility status;Need for lift equipment PT Visit Diagnosis: Unsteadiness on feet (R26.81);Muscle  weakness (generalized) (M62.81)    Time: [id number] PT Time Calculation (min) (ACUTE ONLY): 37 min   Charges:   PT Evaluation $PT Eval Moderate Complexity: 1 Mod          West Carbo, PT, DPT   Acute Rehabilitation Department Office [phone] Secure Chat Communication Preferred  Sandra Cockayne 06/03/2022, 4:01 PM

## 2022-06-03 NOTE — Progress Notes (Addendum)
STROKE TEAM PROGRESS NOTE   INTERVAL HISTORY No family at the bedside.  Patient presented with altered mental status, likely due to UTI.  MRI brain revealed acute infarcts in cerebellar vermis and parafalcine right frontal lobe.  CTA with unchanged near occlusive stenosis of right ICA-vascular surgery consulted.  Patient is laying in the bed in no apparent distress He is confused, moving all extremities equally, We will check vitamin B12 level, TSH, ammonia, CBC and CMP today  Vitals:   06/02/22 1946 06/02/22 2312 06/03/22 0327 06/03/22 0815  BP: (!) 146/73 (!) 148/79 (!) 151/73 (!) 179/90  Pulse: 100 87 98 85  Resp: 12 14 16 16   Temp: 98.2 F (36.8 C) 98.4 F (36.9 C) 98.4 F (36.9 C) 97.7 F (36.5 C)  TempSrc:    Axillary  SpO2: 100% 100% 100% 100%  Weight:      Height:       CBC:  Recent Labs  Lab 06/01/22 2119 06/01/22 2134  WBC 13.7*  --   NEUTROABS 11.4*  --   HGB 10.7* 11.6*  HCT 32.8* 34.0*  MCV 90.6  --   PLT 205  --    Basic Metabolic Panel:  Recent Labs  Lab 06/01/22 2119 06/01/22 2134  NA 141 143  K 3.4* 3.3*  CL 102  --   CO2 26  --   GLUCOSE 277*  --   BUN 19  --   CREATININE 1.83*  --   CALCIUM 8.8*  --    Lipid Panel: No results for input(s): "CHOL", "TRIG", "HDL", "CHOLHDL", "VLDL", "LDLCALC" in the last 168 hours. HgbA1c: No results for input(s): "HGBA1C" in the last 168 hours. Urine Drug Screen: No results for input(s): "LABOPIA", "COCAINSCRNUR", "LABBENZ", "AMPHETMU", "THCU", "LABBARB" in the last 168 hours.  Alcohol Level No results for input(s): "ETH" in the last 168 hours.  IMAGING past 24 hours No results found.  PHYSICAL EXAM  Temp:  [97.5 F (36.4 C)-98.4 F (36.9 C)] 97.7 F (36.5 C) (03/31 0815) Pulse Rate:  [78-100] 85 (03/31 0815) Resp:  [12-17] 16 (03/31 0815) BP: (100-179)/(56-90) 179/90 (03/31 0815) SpO2:  [98 %-100 %] 100 % (03/31 0815)  General - Well nourished, well developed, in no apparent  distress. Cardiovascular - Regular rhythm and rate.  Mental Status -  Confused.  He is oriented to self.  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  III, IV, VI - Extraocular movements intact. V - Facial sensation intact bilaterally. VII - left nasolabial fold flattening but chronic as per daughter VIII - Hearing & vestibular intact bilaterally. X - Palate elevates symmetrically. XI - Chin turning & shoulder shrug intact bilaterally. XII - Tongue protrusion intact.  Motor Strength - The patient's strength was 4-/5 BUEs and 3-/5 BLEs and pronator drift was absent.  Bulk was normal and fasciculations were absent.   Motor Tone - Muscle tone was assessed at the neck and appendages and was normal.  Sensory - Light touch, temperature/pinprick were assessed and were symmetrical subjectively.    Coordination - The patient had normal movements in the hands with no gross ataxia or dysmetria.  Tremor was absent.  Gait and Station - deferred.  ASSESSMENT/PLAN Harry [name] is a 77 y.o. male with history of CKD stage III, HTN, HLD, PVD s/p stent, and DM presenting with generalized weakness.  The patient is awake but does not respond verbally.  He did follow commands.    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   CTA head & neck no LVO.  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  Therapy recommendations: SNF Disposition: Pending  Hypertension Home meds: Coreg 12.5 mg Stable On coreg home dose Long-term BP goal normotensive  Hyperlipidemia Home meds: Crestor 5 mg, resumed in hospital LDL 54, goal < 70 No high intensity statin due to LDL at goal Continue statin at discharge  Acute cystitis with hematuria On Rocephin per primary team Urine culture 80,000 gram-negative rods UA WNC > 50 Management per primary team  Diabetes type II UnControlled Home meds: Insulin HgbA1c 7.1, goal < 7.0 CBGs SSI Will need close outpatient management of diabetes  Other Stroke Risk Factors Advanced Age >/= 75  Hx stroke/TIA Coronary artery disease PVD  Other Active Problems CKD stage IIIb, Cre 1.54->1.83 Left eye blind  Hospital day # 1  Harry Gandy DNP, ACNPC-AG  [facility]  ATTENDING NOTE: I reviewed above note and agree with the assessment and plan. Pt was seen and examined.   Daughter at the bedside. Stated that pt yesterday largely unresponsive but this lunch time he was back to his baseline and able to engage to conversation. In the afternoon, he was given hydralazine and then moved back to bed from wheelchair, and pt became drowsy with slurry speech and confused. Neuro exam no significant change, will do EEG to [place] out seizure. Otherwise, continue DAPT and statin as well as Abx for UTI treatment.   For detailed assessment and plan, please refer to above/below as I have made changes wherever appropriate.   Harry Hawking, MD PhD Stroke Neurology 06/03/2022 5:04 PM    To contact Stroke Continuity provider, please refer to [email or web address] After hours, contact General Neurology

## 2022-06-03 NOTE — Progress Notes (Signed)
Was called into the room because of pt c/o chest pain. Pt states pressure in my chest. MD called and at the bedside. EKG was done- no changes. Assessed abdome/pt states abdoment hurt. Abd distended. Orders for foley was given, Foley placed in 1200 is out. BP reduced.  After foley placed, pt is more confused. MD is aware. Will monitor at this time, will use PRN if needed for agitation.

## 2022-06-03 NOTE — Evaluation (Signed)
SLP Cancellation Note  Patient Details Name: Harry [name] MRN: [id number] DOB: 1945/07/27   Cancelled treatment:       Reason Eval/Treat Not Completed: Other (comment) (pt sleepy, was gvien mouth care and water by RN-followed by delayed strong coughing, per SNF notes pt has diagnosis of oropharyngeal dysphagia, will continue efforts)  Pt did not awaken adequately for po or evaluation with this SLP.   Macario Golds 06/03/2022, 9:22 AM   Kathleen Lime, MS [place] [facility] [phone]

## 2022-06-03 NOTE — Progress Notes (Signed)
EEG complete - results pending 

## 2022-06-03 NOTE — Progress Notes (Signed)
  Echocardiogram 2D Echocardiogram has been performed.  Harry [name] 06/03/2022, 2:53 PM

## 2022-06-04 DIAGNOSIS — J189 Pneumonia, unspecified organism: Secondary | ICD-10-CM | POA: Diagnosis not present

## 2022-06-04 DIAGNOSIS — Z7189 Other specified counseling: Secondary | ICD-10-CM | POA: Diagnosis not present

## 2022-06-04 DIAGNOSIS — N179 Acute kidney failure, unspecified: Secondary | ICD-10-CM

## 2022-06-04 DIAGNOSIS — G9341 Metabolic encephalopathy: Secondary | ICD-10-CM | POA: Diagnosis not present

## 2022-06-04 DIAGNOSIS — N390 Urinary tract infection, site not specified: Secondary | ICD-10-CM | POA: Diagnosis not present

## 2022-06-04 DIAGNOSIS — R4182 Altered mental status, unspecified: Secondary | ICD-10-CM | POA: Diagnosis not present

## 2022-06-04 LAB — GLUCOSE, CAPILLARY
Glucose-Capillary: 107 mg/dL — ABNORMAL HIGH (ref 70–99)
Glucose-Capillary: 272 mg/dL — ABNORMAL HIGH (ref 70–99)
Glucose-Capillary: 181 mg/dL — ABNORMAL HIGH (ref 70–99)
Glucose-Capillary: 169 mg/dL — ABNORMAL HIGH (ref 70–99)

## 2022-06-04 LAB — URINE CULTURE: Culture: 80000 — AB

## 2022-06-04 LAB — HEMOGLOBIN A1C
Hgb A1c MFr Bld: 6.5 % — ABNORMAL HIGH (ref 4.8–5.6)
Mean Plasma Glucose: 140 mg/dL

## 2022-06-04 MED ORDER — SODIUM CHLORIDE 0.9 % IV SOLN
2.0000 g | INTRAVENOUS | Status: AC
  Administered 2022-06-04: 2 g via INTRAVENOUS
  Filled 2022-06-04: qty 20

## 2022-06-04 MED ORDER — CEFADROXIL 500 MG PO CAPS: 1000.0000 mg | ORAL_CAPSULE | Freq: Two times a day (BID) | ORAL | Status: DC

## 2022-06-04 MED ORDER — TAMSULOSIN HCL 0.4 MG PO CAPS
0.4000 mg | ORAL_CAPSULE | Freq: Every day | ORAL | Status: DC
  Administered 2022-06-04 – 2022-06-05 (×2): 0.4 mg via ORAL
  Filled 2022-06-04 (×2): qty 1

## 2022-06-04 MED ORDER — CHLORHEXIDINE GLUCONATE CLOTH 2 % EX PADS
6.0000 | MEDICATED_PAD | Freq: Every day | CUTANEOUS | Status: DC
  Administered 2022-06-04 – 2022-06-05 (×2): 6 via TOPICAL

## 2022-06-04 MED ORDER — CEFADROXIL 500 MG PO CAPS
1000.0000 mg | ORAL_CAPSULE | Freq: Two times a day (BID) | ORAL | Status: DC
  Filled 2022-06-04: qty 2

## 2022-06-04 NOTE — Procedures (Signed)
Patient Name: Harry [name]  MRN: [id number]  Epilepsy Attending: Lora Havens  Referring Physician/Provider: Rosalin Hawking, MD  Date: 06/03/2022 Duration: 30.25 mins  Patient history: 77yo M with ams. EEG to evaluate for seizure  Level of alertness: Awake  AEDs during EEG study: None  Technical aspects: This EEG study was done with scalp electrodes positioned according to the 10-20 International system of electrode placement. Electrical activity was reviewed with band pass filter of 1-70Hz , sensitivity of 7 uV/mm, display speed of 58mm/sec with a 60Hz  notched filter applied as appropriate. EEG data were recorded continuously and digitally stored.  Video monitoring was available and reviewed as appropriate.  Description: The posterior dominant rhythm consists of 8 Hz activity of moderate voltage (25-35 uV) seen predominantly in posterior head regions, symmetric and reactive to eye opening and eye closing. Intermittent generalized 3-5Hz  theta-delta slowing was noted. Physiologic photic driving was not seen during photic stimulation.  Hyperventilation was not performed.     ABNORMALITY - Intermittent slow, generalized  IMPRESSION: This study is suggestive of mild diffuse encephalopathy, nonspecific etiology. No seizures or epileptiform discharges were seen throughout the recording.  Neomi Laidler Barbra Sarks
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
## 2022-06-04 NOTE — Progress Notes (Signed)
Daily Progress Note   Patient Name: Harry [name]       Date: 06/04/2022 DOB: Sep 15, 1945  Age: 77 y.o. MRN#: [id number] Attending Physician: Barb Merino, MD Primary Care Physician: System, Provider Not In Admit Date: 06/01/2022  Reason for Consultation/Follow-up: Establishing goals of care  Subjective: Medical records reviewed including progress notes, labs, imaging. Patient assessed at the bedside. He is sleeping. Did not attempt to arouse in order to preserve comfort.  No family present during my visit.  I then called patient's daughter Harry [name] to provide palliative support.  She confirms that she has discussed with his physicians and has a clear understanding of his plan of care.  She is on her way to visit at the bedside.  Recommended outpatient palliative care follow-up for ongoing goals of care discussions given patient's poor long-term prognosis.  She declines at this time.  Questions and concerns addressed.  PMT will continue to follow and support.  Length of Stay: 2   Physical Exam Vitals and nursing note reviewed.  Constitutional:      General: He is not in acute distress.    Appearance: He is ill-appearing.     Interventions: Nasal cannula in place.     Comments: 3L  Cardiovascular:     Rate and Rhythm: Normal rate.  Pulmonary:     Effort: Pulmonary effort is normal.  Neurological:     Mental Status: He is lethargic.            Vital Signs: BP (!) 146/80 (BP Location: Left Arm)   Pulse 82   Temp 97.9 F (36.6 C) (Oral)   Resp 16   Ht 5\' 8"  (1.727 m)   Wt 84 kg   SpO2 98%   BMI 28.16 kg/m  SpO2: SpO2: 98 % O2 Device: O2 Device: Room Air O2 Flow Rate: O2 Flow Rate (L/min): 3 L/min      Palliative Assessment/Data: 10%   Palliative Care Assessment & Plan    Patient Profile: 77 y.o. male  with past medical history of stage 3b CKD, HTN, HLD, PVD s/p stent, stroke, and DM  presented to ED on 06/01/22 from [facility] with staff concerns of AMS, slurred speech, and left facial droop. MRI revealed 2 punctate infarcts in the right frontal lobe and cerebellum. Patient was admitted  on 06/01/2022 with acute metabolic encephalopathy, abnormal UA, possible PNA, and stroke.   Assessment: Goals of care conversation Acute punctate ischemic infarcts Acute cystitis Uncontrolled DM2 PAD status post stent Acute metabolic encephalopathy  Recommendations/Plan: Continue full code/full scope treatment Patient's daughter confirms desire for any interventions that could prolong patient's life Patient daughter declines outpatient palliative care follow-up at this time PMT will continue to support as needed   Prognosis:  Unable to determine  Discharge Planning: LTC  Care plan was discussed with patient's daughter   Total time: I spent 35 minutes in the care of the patient today in the above activities and documenting the encounter.           Harry Markov Johnnette Litter, PA-C  Palliative Medicine Team Team phone # [phone]  Thank you for allowing the Palliative Medicine Team to assist in the care of this patient. Please utilize secure chat with additional questions, if there is no response within 30 minutes please call the above phone number.  Palliative Medicine Team providers are available by phone from 7am to 7pm daily and can be reached through the team cell phone.  Should this patient require assistance outside of these hours, please call the patient's attending physician.

## 2022-06-04 NOTE — Progress Notes (Signed)
STROKE TEAM PROGRESS NOTE   INTERVAL HISTORY Daughter at the bedside. Pt is reclining in bed for lunch. Per daughter, pt at his baseline today. EEG yesterday showed no seizure.   Vitals:   06/03/22 2007 06/04/22 0451 06/04/22 0915 06/04/22 1108  BP: (!) 150/78 (!) 159/75 (!) 146/80 (!) 159/81  Pulse: 85 84 82 82  Resp: 16 18 16 18   Temp: 98.5 F (36.9 C) 98.1 F (36.7 C) 97.9 F (36.6 C) 97.9 F (36.6 C)  TempSrc: Axillary Axillary Oral Oral  SpO2: 100% 98% 98% 100%  Weight:      Height:       CBC:  Recent Labs  Lab 06/01/22 2119 06/01/22 2134 06/03/22 1227  WBC 13.7*  --  7.5  NEUTROABS 11.4*  --   --   HGB 10.7* 11.6* 10.8*  HCT 32.8* 34.0* 34.9*  MCV 90.6  --  93.1  PLT 205  --  [id number]   Basic Metabolic Panel:  Recent Labs  Lab 06/01/22 2119 06/01/22 2134 06/03/22 1227  NA 141 143 143  K 3.4* 3.3* 3.4*  CL 102  --  105  CO2 26  --  24  GLUCOSE 277*  --  127*  BUN 19  --  16  CREATININE 1.83*  --  1.28*  CALCIUM 8.8*  --  8.7*   Lipid Panel:  Recent Labs  Lab 06/03/22 1227  CHOL 99  TRIG 90  HDL 27*  CHOLHDL 3.7  VLDL 18  LDLCALC 54   HgbA1c:  Recent Labs  Lab 06/02/22 1310  HGBA1C 6.5*   Urine Drug Screen: No results for input(s): "LABOPIA", "COCAINSCRNUR", "LABBENZ", "AMPHETMU", "THCU", "LABBARB" in the last 168 hours.  Alcohol Level No results for input(s): "ETH" in the last 168 hours.  IMAGING past 24 hours EEG adult  Result Date: 06/04/2022 Lora Havens, MD     06/04/2022  9:03 AM Patient Name: Harry [name] MRN: [id number] Epilepsy Attending: Lora Havens Referring Physician/Provider: Rosalin Hawking, MD Date: 06/03/2022 Duration: 30.25 mins Patient history: 77yo M with ams. EEG to evaluate for seizure Level of alertness: Awake AEDs during EEG study: None Technical aspects: This EEG study was done with scalp electrodes positioned according to the 10-20 International system of electrode placement. Electrical activity was reviewed with band  pass filter of 1-70Hz , sensitivity of 7 uV/mm, display speed of 69mm/sec with a 60Hz  notched filter applied as appropriate. EEG data were recorded continuously and digitally stored.  Video monitoring was available and reviewed as appropriate. Description: The posterior dominant rhythm consists of 8 Hz activity of moderate voltage (25-35 uV) seen predominantly in posterior head regions, symmetric and reactive to eye opening and eye closing. Intermittent generalized 3-5Hz  theta-delta slowing was noted. Physiologic photic driving was not seen during photic stimulation.  Hyperventilation was not performed.   ABNORMALITY - Intermittent slow, generalized IMPRESSION: This study is suggestive of mild diffuse encephalopathy, nonspecific etiology. No seizures or epileptiform discharges were seen throughout the recording. Lora Havens   ECHOCARDIOGRAM COMPLETE  Result Date: 06/03/2022    ECHOCARDIOGRAM REPORT   Patient Name:   Harry [name] Date of Exam: 06/03/2022 Medical Rec #:  [id number]        Height:       68.0 in Accession #:    [id number]       Weight:       185.2 lb Date of Birth:  [birth year]         BSA:  1.978 m Patient Age:    77 years         BP:           180/62 mmHg Patient Gender: M                HR:           92 bpm. Exam Location:  Inpatient Procedure: 2D Echo, Cardiac Doppler and Color Doppler Indications:    Stroke  History:        Patient has prior history of Echocardiogram examinations, most                 recent 10/21/2019. Stroke and PVD, Signs/Symptoms:Altered Mental                 Status and Hypotension; Risk Factors:Diabetes, Former Smoker,                 Hypertension and Dyslipidemia. CKD.  Sonographer:    Eartha Inch Referring Phys: [id number] Garrison E DE LA TORRE  Sonographer Comments: Technically challenging study due to limited acoustic windows. Image acquisition challenging due to patient body habitus and Image acquisition challenging due to respiratory motion. IMPRESSIONS   1. Left ventricular ejection fraction, by estimation, is 60 to 65%. The left ventricle has normal function. The left ventricle has no regional wall motion abnormalities. There is mild left ventricular hypertrophy. Left ventricular diastolic parameters are consistent with Grade I diastolic dysfunction (impaired relaxation).  2. Right ventricular systolic function is normal. The right ventricular size is normal. Tricuspid regurgitation signal is inadequate for assessing PA pressure.  3. The mitral valve is normal in structure. Trivial mitral valve regurgitation.  4. The aortic valve is tricuspid. There is mild calcification of the aortic valve. There is mild thickening of the aortic valve. Aortic valve regurgitation is not visualized. Aortic valve sclerosis/calcification is present, without any evidence of aortic stenosis.  5. Aortic dilatation noted. There is borderline dilatation of the aortic root, measuring 37 mm. There is borderline dilatation of the ascending aorta, measuring 39 mm.  6. The inferior vena cava is normal in size with greater than 50% respiratory variability, suggesting right atrial pressure of 3 mmHg. Comparison(s): No significant change from prior study. Conclusion(s)/Recommendation(s): No intracardiac source of embolism detected on this transthoracic study. Consider a transesophageal echocardiogram to exclude cardiac source of embolism if clinically indicated. FINDINGS  Left Ventricle: Left ventricular ejection fraction, by estimation, is 60 to 65%. The left ventricle has normal function. The left ventricle has no regional wall motion abnormalities. The left ventricular internal cavity size was normal in size. There is  mild left ventricular hypertrophy. Left ventricular diastolic parameters are consistent with Grade I diastolic dysfunction (impaired relaxation). Right Ventricle: The right ventricular size is normal. No increase in right ventricular wall thickness. Right ventricular systolic  function is normal. Tricuspid regurgitation signal is inadequate for assessing PA pressure. Left Atrium: Left atrial size was normal in size. Right Atrium: Right atrial size was normal in size. Pericardium: Trivial pericardial effusion is present. The pericardial effusion is anterior to the right ventricle. Mitral Valve: The mitral valve is normal in structure. Trivial mitral valve regurgitation. Tricuspid Valve: The tricuspid valve is normal in structure. Tricuspid valve regurgitation is trivial. Aortic Valve: The aortic valve is tricuspid. There is mild calcification of the aortic valve. There is mild thickening of the aortic valve. Aortic valve regurgitation is not visualized. Aortic valve sclerosis/calcification is present, without any evidence of aortic stenosis. Pulmonic Valve:  The pulmonic valve was normal in structure. Pulmonic valve regurgitation is not visualized. Aorta: Aortic dilatation noted. There is borderline dilatation of the aortic root, measuring 37 mm. There is borderline dilatation of the ascending aorta, measuring 39 mm. Venous: The inferior vena cava is normal in size with greater than 50% respiratory variability, suggesting right atrial pressure of 3 mmHg. IAS/Shunts: The atrial septum is grossly normal.  LEFT VENTRICLE PLAX 2D LVIDd:         3.70 cm     Diastology LVIDs:         2.40 cm     LV e' medial:  4.68 cm/s LV PW:         1.50 cm     LV e' lateral: 3.81 cm/s LV IVS:        1.60 cm LVOT diam:     2.10 cm LV SV:         65 LV SV Index:   33 LVOT Area:     3.46 cm  LV Volumes (MOD) LV vol d, MOD A2C: 44.3 ml LV vol d, MOD A4C: 58.0 ml LV vol s, MOD A2C: 16.4 ml LV vol s, MOD A4C: 17.3 ml LV SV MOD A2C:     27.9 ml LV SV MOD A4C:     58.0 ml LV SV MOD BP:      35.6 ml RIGHT VENTRICLE RV S prime:     12.30 cm/s LEFT ATRIUM             Index        RIGHT ATRIUM          Index LA diam:        3.50 cm 1.77 cm/m   RA Area:     7.92 cm LA Vol (A2C):   43.8 ml 22.14 ml/m  RA Volume:   11.00  ml 5.56 ml/m LA Vol (A4C):   45.4 ml 22.95 ml/m LA Biplane Vol: 47.3 ml 23.91 ml/m  AORTIC VALVE LVOT Vmax:   101.00 cm/s LVOT Vmean:  69.700 cm/s LVOT VTI:    0.189 m  AORTA Ao Root diam: 3.70 cm Ao Asc diam:  3.90 cm  SHUNTS Systemic VTI:  0.19 m Systemic Diam: 2.10 cm Gwyndolyn Kaufman MD Electronically signed by Gwyndolyn Kaufman MD Signature Date/Time: 06/03/2022/3:20:36 PM    Final     PHYSICAL EXAM  Temp:  [97.4 F (36.3 C)-98.5 F (36.9 C)] 97.9 F (36.6 C) (04/01 1108) Pulse Rate:  [82-92] 82 (04/01 1108) Resp:  [16-20] 18 (04/01 1108) BP: (146-200)/(62-86) 159/81 (04/01 1108) SpO2:  [98 %-100 %] 100 % (04/01 1108)  General - Well nourished, well developed, in no apparent distress. Cardiovascular - Regular rhythm and rate.  Mental Status -  Confused.  He is oriented to self.  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  III, IV, VI - Extraocular movements intact. V - Facial sensation intact bilaterally. VII - left nasolabial fold flattening but chronic as per daughter VIII - Hearing & vestibular intact bilaterally. X - Palate elevates symmetrically. XI - Chin turning & shoulder shrug intact bilaterally. XII - Tongue protrusion intact.  Motor Strength - The patient's strength was 4-/5 BUEs and 3-/5 BLEs and pronator drift was absent.  Bulk was normal and fasciculations were absent.   Motor Tone - Muscle tone was assessed  at the neck and appendages and was normal.  Sensory - Light touch, temperature/pinprick were assessed and were symmetrical subjectively.    Coordination - The patient had normal movements in the hands with no gross ataxia or dysmetria.  Tremor was absent.  Gait and Station - deferred.  ASSESSMENT/PLAN Mr. ZMARI RANGER is a 77 y.o. male with history of CKD stage III, HTN, HLD, PVD s/p stent, and DM presenting with  generalized weakness.  The patient is awake but does not respond verbally.  He did follow commands.    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   CTA head & neck no LVO.  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  Therapy recommendations: SNF Disposition: Pending  Hypertension Home meds: Coreg 12.5 mg Stable On coreg home dose Long-term BP goal normotensive  Hyperlipidemia Home meds: Crestor 5 mg, resumed in hospital LDL 54, goal < 70 No high intensity statin due to LDL at goal Continue statin at discharge  Acute cystitis On Rocephin per primary team Urine culture 80,000 gram-negative rods UA WBC > 50 Management per primary team  Diabetes type II UnControlled Home meds: Insulin HgbA1c 7.1, goal < 7.0 CBGs SSI Will need close outpatient management of diabetes  Other Stroke Risk Factors Advanced Age >/= 54  Hx stroke/TIA Coronary artery disease PVD  Other Active Problems AKI on CKD stage IIIb, Cre 1.54->1.83->1.28 Left eye blind, chronic  Hospital day # 2  Neurology will sign off. Please call with questions. Pt will follow up with Dr Leta Baptist at [facility] in about 4 weeks. Thanks for the  consult.   Rosalin Hawking, MD PhD Stroke Neurology 06/04/2022 1:53 PM    To contact Stroke Continuity provider, please refer to [email or web address] After hours, contact General Neurology

## 2022-06-04 NOTE — Progress Notes (Signed)
PROGRESS NOTE    Harry [name]  [id number] DOB: 1945/05/28 DOA: 06/01/2022 PCP: System, Provider Not In    Brief Narrative:  Patient is 77 year old with history of CKD stage IIIb, essential hypertension, hyperlipidemia, peripheral vascular disease status post stents, type 2 diabetes, probable vascular dementia, nursing home resident for the last 2 years brought to the emergency room with garbled speech and not answering questions appropriately.  Unclear last well-known.  In the emergency room hemodynamically stable.  Lethargic.  Slightly elevated lactic acid.  CT scans with chronic changes, MRI with punctate acute infarct in the cerebellar vermis and parafalcine inferior right frontal lobe.  Admitted with acute stroke.  UA was abnormal, however without bacteria.  Also started on Rocephin. 3/31, acute urinary retention more than 1.2 L drained with Foley catheter.  Urine culture positive for 80,000 colonies of E. coli.  Some clinical improvement.    Assessment & Plan:   Acute stroke in a patient with chronic vascular dementia, previous multiple strokes. Acute metabolic encephalopathy likely secondary to stroke and underlying dementia.  Clinical findings, garbled speech.  Less responsiveness.  Clinically improving. CT head findings, no acute findings.  Atrophy and chronic microvascular ischemic changes. MRI of the brain, punctate acute infarcts in the cerebellar vermis and parafalcine impaired right frontal lobe. CT angiogram of the head neck, no large vessel occlusion.  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 2D echocardiogram, no acute findings. No intracardiac thrombus. Antiplatelet therapy, on aspirin and Plavix at home.  Back on aspirin and Plavix.  LDL 54.  Below the goal. Hemoglobin A1c, 7.1.  At goal. B12, TSH and ammonia levels normal. Speech, started on dysphagia 3 diet, continue with all aspiration  precautions. Continue to work with PT OT and speech. Neurology following.  Further management as per neurology. Discharge back to long-term care tomorrow.  Acute metabolic infective encephalopathy, suspected UTI: Urine culture with 80,000 colonies of E. coli.  Urinary retention.  Will treat as symptomatic UTI.  Rocephin day 3 today.  Will treat with cephalosporin for 7 additional days.  Start Flomax.  Discharged with Foley catheter in place due to significant retention.  Will schedule for voiding trial in 2 to 3 weeks.  Discontinue IV fluids today.  Fall precautions.  Delirium precautions.  Essential hypertension: Blood pressure better today.  On Coreg.   AKI on CKD stage IIIa: Improved overnight with IV fluid hydration.  Urine output is adequate.  Type 2 diabetes, well-controlled.  A1c 7.1.  Keep on sliding scale insulin.  Goal of care Patient with advanced dementia that is probably vascular dementia, mostly bedbound for last 2 years.  Now with acute stroke.  Poor rehab candidate.   Recommended to have continued palliative care discussions. If adequate oral intake, he will go back to long-term nursing home. Appreciate palliative care follow-up. Anticipate discharge tomorrow.  DVT prophylaxis: enoxaparin (LOVENOX) injection 40 mg Start: 06/02/22 0830   Code Status: Full code Family Communication: Daughter Ms. [name] on the phone Disposition Plan: Status is: Inpatient Remains inpatient appropriate because: Acute stroke, workup, poor oral intake     Consultants:  Neurology Palliative care  Procedures:  None  Antimicrobials:  Rocephin 3/30---   Subjective:  Patient seen and examined.  No overnight events.  He remained quite comfortable after catheter was placed in the evening.  He had some episodes of confusion yesterday evening after urinary retention that has been clearing now. Poor historian.  He tells me he feels fine.  Objective: Vitals:   06/03/22 2007  06/04/22 0451 06/04/22 0915 06/04/22 1108  BP: (!) 150/78 (!) 159/75 (!) 146/80 (!) 159/81  Pulse: 85 84 82 82  Resp: 16 18 16 18   Temp: 98.5 F (36.9 C) 98.1 F (36.7 C) 97.9 F (36.6 C) 97.9 F (36.6 C)  TempSrc: Axillary Axillary Oral Oral  SpO2: 100% 98% 98% 100%  Weight:      Height:        Intake/Output Summary (Last 24 hours) at 06/04/2022 1136 Last data filed at 06/04/2022 0926 Gross per 24 hour  Intake 527.85 ml  Output 2025 ml  Net -1497.15 ml    Filed Weights   06/01/22 2058  Weight: 84 kg    Examination:  General exam: Appears frail debilitated.  Chronically sick looking.  Not in any distress. Respiratory system: No added sounds. Cardiovascular system: S1-S2 normal.  No added sounds.   Gastrointestinal system: Soft and nontender. Central nervous system:  Looks comfortable.  Alert and able to keep up simple conversation. Left eye is opaque.  Right eye is open and normal vision. He is generally weak.  No appreciable asymmetrical weakness.   Data Reviewed: I have personally reviewed following labs and imaging studies  CBC: Recent Labs  Lab 06/01/22 2119 06/01/22 2134 06/03/22 1227  WBC 13.7*  --  7.5  NEUTROABS 11.4*  --   --   HGB 10.7* 11.6* 10.8*  HCT 32.8* 34.0* 34.9*  MCV 90.6  --  93.1  PLT 205  --  [id number]    Basic Metabolic Panel: Recent Labs  Lab 06/01/22 2119 06/01/22 2134 06/03/22 1227  NA 141 143 143  K 3.4* 3.3* 3.4*  CL 102  --  105  CO2 26  --  24  GLUCOSE 277*  --  127*  BUN 19  --  16  CREATININE 1.83*  --  1.28*  CALCIUM 8.8*  --  8.7*    GFR: Estimated Creatinine Clearance: 52.6 mL/min (A) (by C-G formula based on SCr of 1.28 mg/dL (H)). Liver Function Tests: Recent Labs  Lab 06/01/22 2119 06/03/22 1227  AST 22 18  ALT 17 15  ALKPHOS 60 64  BILITOT 1.1 1.2  PROT 6.6 7.1  ALBUMIN 3.0* 3.2*    No results for input(s): "LIPASE", "AMYLASE" in the last 168 hours. Recent Labs  Lab 06/03/22 1227  AMMONIA 16     Coagulation Profile: Recent Labs  Lab 06/01/22 2147  INR 1.3*    Cardiac Enzymes: No results for input(s): "CKTOTAL", "CKMB", "CKMBINDEX", "TROPONINI" in the last 168 hours. BNP (last 3 results) No results for input(s): "PROBNP" in the last 8760 hours. HbA1C: Recent Labs    06/02/22 1310  HGBA1C 6.5*   CBG: Recent Labs  Lab 06/03/22 1134 06/03/22 1636 06/03/22 2102 06/04/22 0615 06/04/22 1109  GLUCAP 126* 183* 166* 107* 272*    Lipid Profile: Recent Labs    06/03/22 1227  CHOL 99  HDL 27*  LDLCALC 54  TRIG 90  CHOLHDL 3.7    Thyroid Function Tests: Recent Labs    06/03/22 1227  TSH 3.668    Anemia Panel: Recent Labs    06/03/22 1227  VITAMINB12 658    Sepsis Labs: Recent Labs  Lab 06/01/22 2138 06/02/22 0232 06/02/22 0959 06/02/22 1310  LATICACIDVEN 1.7 2.5* 1.2 1.3     Recent Results (from the past 240 hour(s))  Blood Culture (routine x 2)  Status: None (Preliminary result)   Collection Time: 06/01/22  9:43 PM   Specimen: BLOOD RIGHT FOREARM  Result Value Ref Range Status   Specimen Description BLOOD RIGHT FOREARM  Final   Special Requests   Final    BOTTLES DRAWN AEROBIC AND ANAEROBIC Blood Culture adequate volume   Culture   Final    NO GROWTH 3 DAYS Performed at [facility], [address] [place], [place] [address]    Report Status PENDING  Incomplete  Blood Culture (routine x 2)     Status: None (Preliminary result)   Collection Time: 06/01/22  9:47 PM   Specimen: BLOOD  Result Value Ref Range Status   Specimen Description BLOOD RIGHT ANTECUBITAL  Final   Special Requests   Final    BOTTLES DRAWN AEROBIC AND ANAEROBIC Blood Culture adequate volume   Culture   Final    NO GROWTH 3 DAYS Performed at [facility], [place] [address], [place], [place] [address]    Report Status PENDING  Incomplete  SARS Coronavirus 2 by RT PCR (hospital order, performed in [facility] hospital lab) *cepheid single result  test* Anterior Nasal Swab     Status: None   Collection Time: 06/01/22 10:39 PM   Specimen: Anterior Nasal Swab  Result Value Ref Range Status   SARS Coronavirus 2 by RT PCR NEGATIVE NEGATIVE Final    Comment: Performed at [facility], [place] [address], [place], [place] [address]  Urine Culture (for pregnant, neutropenic or urologic patients or patients with an indwelling urinary catheter)     Status: Abnormal   Collection Time: 06/02/22  4:43 AM   Specimen: Urine, Clean Catch  Result Value Ref Range Status   Specimen Description URINE, CLEAN CATCH  Final   Special Requests   Final    NONE Performed at [facility], [place] [address], [place], [place] [address]    Culture 80,000 COLONIES/mL ESCHERICHIA COLI (A)  Final   Report Status 2022/06/20 FINAL  Final   Organism ID, Bacteria ESCHERICHIA COLI (A)  Final      Susceptibility   Escherichia coli - MIC*    AMPICILLIN 8 SENSITIVE Sensitive     CEFAZOLIN <=4 SENSITIVE Sensitive     CEFEPIME <=0.12 SENSITIVE Sensitive     CEFTRIAXONE <=0.25 SENSITIVE Sensitive     CIPROFLOXACIN <=0.25 SENSITIVE Sensitive     GENTAMICIN <=1 SENSITIVE Sensitive     IMIPENEM <=0.25 SENSITIVE Sensitive     NITROFURANTOIN <=16 SENSITIVE Sensitive     TRIMETH/SULFA <=20 SENSITIVE Sensitive     AMPICILLIN/SULBACTAM 4 SENSITIVE Sensitive     PIP/TAZO <=4 SENSITIVE Sensitive     * 80,000 COLONIES/mL ESCHERICHIA COLI         Radiology Studies: EEG adult  Result Date: Jun 20, 2022 Lora Havens, MD     06/20/22  9:03 AM Patient Name: ARDATH SEELE MRN: [id number] Epilepsy Attending: Lora Havens Referring Physician/Provider: Rosalin Hawking, MD Date: 06/03/2022 Duration: 30.25 mins Patient history: 77yo M with ams. EEG to evaluate for seizure Level of alertness: Awake AEDs during EEG study: None Technical aspects: This EEG study was done with scalp electrodes positioned according to the 10-20 International system of electrode placement.  Electrical activity was reviewed with band pass filter of 1-70Hz , sensitivity of 7 uV/mm, display speed of 66mm/sec with a 60Hz  notched filter applied as appropriate. EEG data were recorded continuously and digitally stored.  Video monitoring was available and reviewed  as appropriate. Description: The posterior dominant rhythm consists of 8 Hz activity of moderate voltage (25-35 uV) seen predominantly in posterior head regions, symmetric and reactive to eye opening and eye closing. Intermittent generalized 3-5Hz  theta-delta slowing was noted. Physiologic photic driving was not seen during photic stimulation.  Hyperventilation was not performed.   ABNORMALITY - Intermittent slow, generalized IMPRESSION: This study is suggestive of mild diffuse encephalopathy, nonspecific etiology. No seizures or epileptiform discharges were seen throughout the recording. Lora Havens   ECHOCARDIOGRAM COMPLETE  Result Date: 06/03/2022    ECHOCARDIOGRAM REPORT   Patient Name:   FLEM EBRAHIM Date of Exam: 06/03/2022 Medical Rec #:  [id number]        Height:       68.0 in Accession #:    [id number]       Weight:       185.2 lb Date of Birth:  10/23/1945         BSA:          1.978 m Patient Age:    50 years         BP:           180/62 mmHg Patient Gender: M                HR:           92 bpm. Exam Location:  Inpatient Procedure: 2D Echo, Cardiac Doppler and Color Doppler Indications:    Stroke  History:        Patient has prior history of Echocardiogram examinations, most                 recent 10/21/2019. Stroke and PVD, Signs/Symptoms:Altered Mental                 Status and Hypotension; Risk Factors:Diabetes, Former Smoker,                 Hypertension and Dyslipidemia. CKD.  Sonographer:    Eartha Inch Referring Phys: [id number] [place] E DE LA TORRE  Sonographer Comments: Technically challenging study due to limited acoustic windows. Image acquisition challenging due to patient body habitus and Image acquisition  challenging due to respiratory motion. IMPRESSIONS  1. Left ventricular ejection fraction, by estimation, is 60 to 65%. The left ventricle has normal function. The left ventricle has no regional wall motion abnormalities. There is mild left ventricular hypertrophy. Left ventricular diastolic parameters are consistent with Grade I diastolic dysfunction (impaired relaxation).  2. Right ventricular systolic function is normal. The right ventricular size is normal. Tricuspid regurgitation signal is inadequate for assessing PA pressure.  3. The mitral valve is normal in structure. Trivial mitral valve regurgitation.  4. The aortic valve is tricuspid. There is mild calcification of the aortic valve. There is mild thickening of the aortic valve. Aortic valve regurgitation is not visualized. Aortic valve sclerosis/calcification is present, without any evidence of aortic stenosis.  5. Aortic dilatation noted. There is borderline dilatation of the aortic root, measuring 37 mm. There is borderline dilatation of the ascending aorta, measuring 39 mm.  6. The inferior vena cava is normal in size with greater than 50% respiratory variability, suggesting right atrial pressure of 3 mmHg. Comparison(s): No significant change from prior study. Conclusion(s)/Recommendation(s): No intracardiac source of embolism detected on this transthoracic study. Consider a transesophageal echocardiogram to exclude cardiac source of embolism if clinically indicated. FINDINGS  Left Ventricle: Left ventricular ejection fraction, by estimation, is 60 to 65%. The left ventricle  has normal function. The left ventricle has no regional wall motion abnormalities. The left ventricular internal cavity size was normal in size. There is  mild left ventricular hypertrophy. Left ventricular diastolic parameters are consistent with Grade I diastolic dysfunction (impaired relaxation). Right Ventricle: The right ventricular size is normal. No increase in right  ventricular wall thickness. Right ventricular systolic function is normal. Tricuspid regurgitation signal is inadequate for assessing PA pressure. Left Atrium: Left atrial size was normal in size. Right Atrium: Right atrial size was normal in size. Pericardium: Trivial pericardial effusion is present. The pericardial effusion is anterior to the right ventricle. Mitral Valve: The mitral valve is normal in structure. Trivial mitral valve regurgitation. Tricuspid Valve: The tricuspid valve is normal in structure. Tricuspid valve regurgitation is trivial. Aortic Valve: The aortic valve is tricuspid. There is mild calcification of the aortic valve. There is mild thickening of the aortic valve. Aortic valve regurgitation is not visualized. Aortic valve sclerosis/calcification is present, without any evidence of aortic stenosis. Pulmonic Valve: The pulmonic valve was normal in structure. Pulmonic valve regurgitation is not visualized. Aorta: Aortic dilatation noted. There is borderline dilatation of the aortic root, measuring 37 mm. There is borderline dilatation of the ascending aorta, measuring 39 mm. Venous: The inferior vena cava is normal in size with greater than 50% respiratory variability, suggesting right atrial pressure of 3 mmHg. IAS/Shunts: The atrial septum is grossly normal.  LEFT VENTRICLE PLAX 2D LVIDd:         3.70 cm     Diastology LVIDs:         2.40 cm     LV e' medial:  4.68 cm/s LV PW:         1.50 cm     LV e' lateral: 3.81 cm/s LV IVS:        1.60 cm LVOT diam:     2.10 cm LV SV:         65 LV SV Index:   33 LVOT Area:     3.46 cm  LV Volumes (MOD) LV vol d, MOD A2C: 44.3 ml LV vol d, MOD A4C: 58.0 ml LV vol s, MOD A2C: 16.4 ml LV vol s, MOD A4C: 17.3 ml LV SV MOD A2C:     27.9 ml LV SV MOD A4C:     58.0 ml LV SV MOD BP:      35.6 ml RIGHT VENTRICLE RV S prime:     12.30 cm/s LEFT ATRIUM             Index        RIGHT ATRIUM          Index LA diam:        3.50 cm 1.77 cm/m   RA Area:     7.92 cm  LA Vol (A2C):   43.8 ml 22.14 ml/m  RA Volume:   11.00 ml 5.56 ml/m LA Vol (A4C):   45.4 ml 22.95 ml/m LA Biplane Vol: 47.3 ml 23.91 ml/m  AORTIC VALVE LVOT Vmax:   101.00 cm/s LVOT Vmean:  69.700 cm/s LVOT VTI:    0.189 m  AORTA Ao Root diam: 3.70 cm Ao Asc diam:  3.90 cm  SHUNTS Systemic VTI:  0.19 m Systemic Diam: 2.10 cm Gwyndolyn Kaufman MD Electronically signed by Gwyndolyn Kaufman MD Signature Date/Time: 06/03/2022/3:20:36 PM    Final         Scheduled Meds:  aspirin EC  81 mg Oral Daily   carvedilol  12.5 mg Oral BID WC   [START ON 06/05/2022] cefadroxil  1,000 mg Oral BID   Chlorhexidine Gluconate Cloth  6 each Topical Daily   clopidogrel  75 mg Oral Daily   enoxaparin (LOVENOX) injection  40 mg Subcutaneous Q24H   insulin aspart  0-15 Units Subcutaneous TID WC   insulin aspart  0-5 Units Subcutaneous QHS   tamsulosin  0.4 mg Oral Daily   Continuous Infusions:  sodium chloride 50 mL/hr at 06/03/22 2349   cefTRIAXone (ROCEPHIN)  IV       LOS: 2 days    Time spent: 40 minutes    Barb Merino, MD [facility] Pager [phone]

## 2022-06-05 DIAGNOSIS — G9341 Metabolic encephalopathy: Secondary | ICD-10-CM | POA: Diagnosis not present

## 2022-06-05 LAB — COMPREHENSIVE METABOLIC PANEL
ALT: 10 U/L (ref 0–44)
AST: 14 U/L — ABNORMAL LOW (ref 15–41)
Albumin: 2.5 g/dL — ABNORMAL LOW (ref 3.5–5.0)
Alkaline Phosphatase: 50 U/L (ref 38–126)
Anion gap: 11 (ref 5–15)
BUN: 11 mg/dL (ref 8–23)
CO2: 26 mmol/L (ref 22–32)
Calcium: 8.1 mg/dL — ABNORMAL LOW (ref 8.9–10.3)
Chloride: 103 mmol/L (ref 98–111)
Creatinine, Ser: 1.12 mg/dL (ref 0.61–1.24)
GFR, Estimated: >60 mL/min (ref >60)
Glucose, Bld: 165 mg/dL — ABNORMAL HIGH (ref 70–99)
Potassium: 3.3 mmol/L — ABNORMAL LOW (ref 3.5–5.1)
Sodium: 140 mmol/L (ref 135–145)
Total Bilirubin: 0.9 mg/dL (ref 0.3–1.2)
Total Protein: 6 g/dL — ABNORMAL LOW (ref 6.5–8.1)

## 2022-06-05 LAB — CBC WITH DIFFERENTIAL/PLATELET
Abs Immature Granulocytes: 0.01 10*3/uL (ref 0.00–0.07)
Basophils Absolute: 0 10*3/uL (ref 0.0–0.1)
Basophils Relative: 1 %
Eosinophils Absolute: 0.2 10*3/uL (ref 0.0–0.5)
Eosinophils Relative: 4 %
HCT: 29.1 % — ABNORMAL LOW (ref 39.0–52.0)
Hemoglobin: 9.4 g/dL — ABNORMAL LOW (ref 13.0–17.0)
Immature Granulocytes: 0 %
Lymphocytes Relative: 22 %
Lymphs Abs: 1.2 10*3/uL (ref 0.7–4.0)
MCH: 29.3 pg (ref 26.0–34.0)
MCHC: 32.3 g/dL (ref 30.0–36.0)
MCV: 90.7 fL (ref 80.0–100.0)
Monocytes Absolute: 0.7 10*3/uL (ref 0.1–1.0)
Monocytes Relative: 13 %
Neutro Abs: 3.2 10*3/uL (ref 1.7–7.7)
Neutrophils Relative %: 60 %
Platelets: 182 10*3/uL (ref 150–400)
RBC: 3.21 MIL/uL — ABNORMAL LOW (ref 4.22–5.81)
RDW: 14.6 % (ref 11.5–15.5)
WBC: 5.3 10*3/uL (ref 4.0–10.5)
nRBC: 0 % (ref 0.0–0.2)

## 2022-06-05 LAB — GLUCOSE, CAPILLARY
Glucose-Capillary: 168 mg/dL — ABNORMAL HIGH (ref 70–99)
Glucose-Capillary: 168 mg/dL — ABNORMAL HIGH (ref 70–99)

## 2022-06-05 LAB — MAGNESIUM: Magnesium: 1.5 mg/dL — ABNORMAL LOW (ref 1.7–2.4)

## 2022-06-05 LAB — PHOSPHORUS: Phosphorus: 2.9 mg/dL (ref 2.5–4.6)

## 2022-06-05 MED ORDER — MAGNESIUM OXIDE -MG SUPPLEMENT 400 (240 MG) MG PO TABS: 200.0000 mg | ORAL_TABLET | Freq: Every day | ORAL | Status: AC

## 2022-06-05 MED ORDER — CEFADROXIL 500 MG PO CAPS: 1000.0000 mg | ORAL_CAPSULE | Freq: Two times a day (BID) | ORAL | 0 refills | Status: AC

## 2022-06-05 MED ORDER — CLOPIDOGREL BISULFATE 75 MG PO TABS: 75.0000 mg | ORAL_TABLET | Freq: Every day | ORAL | 2 refills | Status: AC

## 2022-06-05 MED ORDER — MAGNESIUM SULFATE 2 GM/50ML IV SOLN
2.0000 g | Freq: Once | INTRAVENOUS | Status: AC
  Administered 2022-06-05: 2 g via INTRAVENOUS
  Filled 2022-06-05: qty 50

## 2022-06-05 MED ORDER — TAMSULOSIN HCL 0.4 MG PO CAPS: 0.4000 mg | ORAL_CAPSULE | Freq: Every day | ORAL | Status: DC

## 2022-06-05 MED ORDER — MAGNESIUM OXIDE -MG SUPPLEMENT 400 (240 MG) MG PO TABS
200.0000 mg | ORAL_TABLET | Freq: Every day | ORAL | Status: DC
  Administered 2022-06-05: 200 mg via ORAL
  Filled 2022-06-05: qty 1

## 2022-06-05 MED ORDER — POTASSIUM CHLORIDE CRYS ER 20 MEQ PO TBCR
40.0000 meq | EXTENDED_RELEASE_TABLET | Freq: Once | ORAL | Status: AC
  Administered 2022-06-05: 40 meq via ORAL
  Filled 2022-06-05: qty 2

## 2022-06-05 NOTE — Progress Notes (Addendum)
Physical Therapy Treatment Patient Details Name: Harry [name] MRN: [id number] DOB: 11-20-1945 Today's Date: 06/05/2022   History of Present Illness Pt is a 77 y.o. male presenting 3/29 presenting from SNF to ED with AMS, slurred speech, and questionable L sided facial droop. Pt found to have abnormal UA. 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 Additionally found to have PNA. PMH includes: HTN, HLD, DM II, CVA, PVD, CKD III, and dementia.    PT Comments    Pt lethargic; able to participate with consistent verbal stimulation for alertness and arousal. Pt requiring increased assist for bed mobility due to decreased initiation. Utilized Stedy for pre transfer training to promote BLE weightbearing and upright. Pt stood x 2 with two person assist. Returned to bed for peri care and linen change. Patient will benefit from continued inpatient follow up therapy, <3 hours/day.    Recommendations for follow up therapy are one component of a multi-disciplinary discharge planning process, led by the attending physician.  Recommendations may be updated based on patient status, additional functional criteria and insurance authorization.  Follow Up Recommendations  Can patient physically be transported by private vehicle: No    Assistance Recommended at Discharge Frequent or constant Supervision/Assistance  Patient can return home with the following Two people to help with walking and/or transfers;Two people to help with bathing/dressing/bathroom;Assistance with cooking/housework;Direct supervision/assist for medications management;Direct supervision/assist for financial management;Assist for transportation;Help with stairs or ramp for entrance   Equipment Recommendations  None recommended by PT    Recommendations for Other Services       Precautions / Restrictions Precautions Precautions: Fall Restrictions Weight Bearing Restrictions: No      Mobility  Bed Mobility Overal bed mobility: Needs Assistance Bed Mobility: Rolling, Supine to Sit, Sit to Supine Rolling: Mod assist   Supine to sit: Max assist, +2 for physical assistance Sit to supine: Max assist, +2 for physical assistance   General bed mobility comments: Decreased initiation by pt, hand over hand guidance for use of bed rail    Transfers Overall transfer level: Needs assistance Equipment used: Ambulation equipment used Transfers: Sit to/from Stand Sit to Stand: +2 physical assistance, +2 safety/equipment, Mod assist           General transfer comment: ModA + 2 to stand to [facility] with max multimodal cues for initiation and execution.    Ambulation/Gait                   Stairs             Wheelchair Mobility    Modified Rankin (Stroke Patients Only) Modified Rankin (Stroke Patients Only) Pre-Morbid Rankin Score: Moderately severe disability Modified Rankin: Severe disability     Balance Overall balance assessment: Needs assistance Sitting-balance support: Single extremity supported, No upper extremity supported, Feet supported Sitting balance-Leahy Scale: Poor Sitting balance - Comments: reliant on at least single UE support. Increased time to achieve midline                                    Cognition Arousal/Alertness: Awake/alert Behavior During Therapy: Flat affect Overall Cognitive Status: History of cognitive impairments - at baseline                                 General Comments: Lethargic, needs  stimulation to maintain alertness and hand over hand guidance with command following        Exercises      General Comments        Pertinent Vitals/Pain Pain Assessment Pain Assessment: Faces Faces Pain Scale: No hurt    Home Living                          Prior Function            PT Goals (current goals can now be found in the care plan section) Acute Rehab  PT Goals Patient Stated Goal: return to rehab at [facility] place PT Goal Formulation: With patient Time For Goal Achievement: 06/17/22 Potential to Achieve Goals: Fair Progress towards PT goals: Progressing toward goals    Frequency    Min 2X/week      PT Plan Frequency needs to be updated    Co-evaluation              AM-PAC PT "6 Clicks" Mobility   Outcome Measure  Help needed turning from your back to your side while in a flat bed without using bedrails?: A Lot Help needed moving from lying on your back to sitting on the side of a flat bed without using bedrails?: A Lot Help needed moving to and from a bed to a chair (including a wheelchair)?: Total Help needed standing up from a chair using your arms (e.g., wheelchair or bedside chair)?: Total Help needed to walk in hospital room?: Total Help needed climbing 3-5 steps with a railing? : Total 6 Click Score: 8    End of Session Equipment Utilized During Treatment: Gait belt Activity Tolerance: Patient tolerated treatment well Patient left: with call bell/phone within reach;in bed;with bed alarm set Nurse Communication: Mobility status PT Visit Diagnosis: Unsteadiness on feet (R26.81);Muscle weakness (generalized) (M62.81)     Time: [id number] PT Time Calculation (min) (ACUTE ONLY): 24 min  Charges:  $Therapeutic Activity: 23-37 mins                     Wyona Almas, PT, DPT Acute Rehabilitation Services Office [phone]    DEWITTE KROGSTAD 06/05/2022, 1:47 PM

## 2022-06-05 NOTE — Progress Notes (Signed)
Report given to Barbaraann Share, LPN from [place], Patient ready for discharge, waiting [facility] for transportati

## 2022-06-05 NOTE — TOC Transition Note (Signed)
Transition of Care [facility]) - CM/SW Discharge Note   Patient Details  Name: Harry [name] MRN: [id number] Date of Birth: Aug 07, 1945  Transition of Care [facility]) CM/SW Contact:  Geralynn Ochs, LCSW Phone Number: 06/05/2022, 12:31 PM   Clinical Narrative:   CSW updated by MD that patient medically stable to return to SNF. Patient's Medicare days have reset, so patient can readmit for rehab. CSW updated daughter, [name], she is in agreement. Transport arranged with [facility] for next available.  Nurse to call report to [phone], Room 505A.    Final next level of care: Skilled Nursing Facility Barriers to Discharge: Barriers Resolved   Patient Goals and CMS Choice CMS Medicare.gov Compare Post Acute Care list provided to:: Patient Represenative (must comment) Choice offered to / list presented to : Adult Children  Discharge Placement                Patient chooses bed at: [facility] Patient to be transferred to facility by: [place] Name of family member notified: [name] Patient and family notified of of transfer: 06/05/22  Discharge Plan and Services Additional resources added to the After Visit Summary for       Post Acute Care Choice: [place]                               Social Determinants of Health (SDOH) Interventions SDOH Screenings   Food Insecurity: No Food Insecurity (03/01/2020)  Housing: Low Risk  (09/29/2018)  Transportation Needs: No Transportation Needs (03/01/2020)  Depression (PHQ2-9): Low Risk  (03/01/2020)  Financial Resource Strain: Low Risk  (09/29/2018)  Physical Activity: Inactive (09/29/2018)  Tobacco Use: Medium Risk (06/03/2020)     Readmission Risk Interventions     No data to display

## 2022-06-05 NOTE — Care Management Important Message (Signed)
Important Message  Patient Details  Name: Harry [name] MRN: [id number] Date of Birth: 04/25/1945   Medicare Important Message Given:  Yes   Due to illness patient could not sign. Copy left signed by CMA  Orbie Pyo 06/05/2022, 11:33 AM

## 2022-06-05 NOTE — Plan of Care (Signed)
Problem: Education: Goal: Knowledge of disease or condition will improve Outcome: Adequate for Discharge Goal: Knowledge of secondary prevention will improve (MUST DOCUMENT ALL) Outcome: Adequate for Discharge Goal: Knowledge of patient specific risk factors will improve (Mark N/A or DELETE if not current risk factor) Outcome: Adequate for Discharge   Problem: Ischemic Stroke/TIA Tissue Perfusion: Goal: Complications of ischemic stroke/TIA will be minimized Outcome: Adequate for Discharge   Problem: Coping: Goal: Will verbalize positive feelings about self Outcome: Adequate for Discharge Goal: Will identify appropriate support needs Outcome: Adequate for Discharge   Problem: Health Behavior/Discharge Planning: Goal: Ability to manage health-related needs will improve Outcome: Adequate for Discharge Goal: Goals will be collaboratively established with patient/family Outcome: Adequate for Discharge   Problem: Self-Care: Goal: Ability to participate in self-care as condition permits will improve Outcome: Adequate for Discharge Goal: Verbalization of feelings and concerns over difficulty with self-care will improve Outcome: Adequate for Discharge Goal: Ability to communicate needs accurately will improve Outcome: Adequate for Discharge   Problem: Nutrition: Goal: Risk of aspiration will decrease Outcome: Adequate for Discharge Goal: Dietary intake will improve Outcome: Adequate for Discharge   Problem: Education: Goal: Ability to describe self-care measures that may prevent or decrease complications (Diabetes Survival Skills Education) will improve Outcome: Adequate for Discharge Goal: Individualized Educational Video(s) Outcome: Adequate for Discharge   Problem: Coping: Goal: Ability to adjust to condition or change in health will improve Outcome: Adequate for Discharge   Problem: Fluid Volume: Goal: Ability to maintain a balanced intake and output will  improve Outcome: Adequate for Discharge   Problem: Health Behavior/Discharge Planning: Goal: Ability to identify and utilize available resources and services will improve Outcome: Adequate for Discharge Goal: Ability to manage health-related needs will improve Outcome: Adequate for Discharge   Problem: Metabolic: Goal: Ability to maintain appropriate glucose levels will improve Outcome: Adequate for Discharge   Problem: Nutritional: Goal: Maintenance of adequate nutrition will improve Outcome: Adequate for Discharge Goal: Progress toward achieving an optimal weight will improve Outcome: Adequate for Discharge   Problem: Skin Integrity: Goal: Risk for impaired skin integrity will decrease Outcome: Adequate for Discharge   Problem: Tissue Perfusion: Goal: Adequacy of tissue perfusion will improve Outcome: Adequate for Discharge   Problem: Education: Goal: Knowledge of General Education information will improve Description: Including pain rating scale, medication(s)/side effects and non-pharmacologic comfort measures Outcome: Adequate for Discharge   Problem: Health Behavior/Discharge Planning: Goal: Ability to manage health-related needs will improve Outcome: Adequate for Discharge   Problem: Clinical Measurements: Goal: Ability to maintain clinical measurements within normal limits will improve Outcome: Adequate for Discharge Goal: Will remain free from infection Outcome: Adequate for Discharge Goal: Diagnostic test results will improve Outcome: Adequate for Discharge Goal: Respiratory complications will improve Outcome: Adequate for Discharge Goal: Cardiovascular complication will be avoided Outcome: Adequate for Discharge   Problem: Activity: Goal: Risk for activity intolerance will decrease Outcome: Adequate for Discharge   Problem: Nutrition: Goal: Adequate nutrition will be maintained Outcome: Adequate for Discharge   Problem: Coping: Goal: Level of  anxiety will decrease Outcome: Adequate for Discharge   Problem: Elimination: Goal: Will not experience complications related to bowel motility Outcome: Adequate for Discharge Goal: Will not experience complications related to urinary retention Outcome: Adequate for Discharge   Problem: Pain Managment: Goal: General experience of comfort will improve Outcome: Adequate for Discharge   Problem: Safety: Goal: Ability to remain free from injury will improve Outcome: Adequate for Discharge   Problem: Skin Integrity: Goal: Risk for   impaired skin integrity will decrease Outcome: Adequate for Discharge   

## 2022-06-05 NOTE — Evaluation (Signed)
SLP Cancellation Note  Patient Details Name: HAGOP MAKELY MRN: [id number] DOB: 10-11-1945   Cancelled treatment:       Reason Eval/Treat Not Completed: Other (comment) (pt sleeping upon arrival today and did not awaken adequately awake for po, stated "Get back" and went back to sleep; will continue efforts)  Kathleen Lime, MS [facility] SLP Acute Rehab Services Office [phone]  Macario Golds 06/05/2022, 8:51 AM

## 2022-06-05 NOTE — Progress Notes (Signed)
Patient transferred by [facility] to [facility]

## 2022-06-05 NOTE — Discharge Summary (Signed)
Physician Discharge Summary  Harry [name] [id number] DOB: Jun 30, 1945 DOA: 06/01/2022  PCP: System, Provider Not In  Admit date: 06/01/2022 Discharge date: 06/05/2022  Admitted From: Long-term nursing home Disposition: Long-term nursing home  Recommendations for Outpatient Follow-up:  Follow up with PCP in 1-2 weeks Please obtain BMP/CBC/magnesium/phosphorus in one week Neurology office will schedule outpatient follow-up Given voiding trial in 2 weeks at nursing home.  If recurrent retention, Place Foley catheter and send urology referral.  Discharge Condition: Fair CODE STATUS: Full code Diet recommendation: Low-salt and low-carb diet, aspiration precautions.  Discharge summary: Patient is 77 year old with history of CKD stage IIIb, essential hypertension, hyperlipidemia, peripheral vascular disease status post stents, type 2 diabetes, probable vascular dementia, nursing home resident for the last 2 years brought to the emergency room with garbled speech and not answering questions appropriately.  Unclear last well-known.  In the emergency room hemodynamically stable.  Lethargic.  Slightly elevated lactic acid.  CT scans with chronic changes, MRI with punctate acute infarct in the cerebellar vermis and parafalcine inferior right frontal lobe.  Admitted with acute stroke.  UA was abnormal, however without bacteria.  Also started on Rocephin. 3/31, acute urinary retention more than 1.2 L drained with Foley catheter.  Urine culture positive for 80,000 colonies of E. coli.  Some clinical improvement now.      Assessment & Plan of care:    Acute stroke in a patient with chronic vascular dementia, previous multiple strokes. Acute metabolic encephalopathy likely secondary to stroke and underlying dementia. Clearing.    Clinical findings, garbled speech.  Less responsiveness.  Clinically improving now.  Mental status has improved and almost back to normal self as per family. CT head  findings, no acute findings.  Atrophy and chronic microvascular ischemic changes. MRI of the brain, punctate acute infarcts in the cerebellar vermis and parafalcine impaired right frontal lobe. CT angiogram of the head neck, no large vessel occlusion.  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 2D echocardiogram, no acute findings. No intracardiac thrombus. Antiplatelet therapy, on aspirin at home. Now on aspirin and Plavix until neurology follow-up as outpatient and decide on monotherapy. LDL 54.  Below the goal. Hemoglobin A1c, 7.1.  At goal.  Resume long-acting insulin as now he is eating. B12, TSH and ammonia levels normal. Speech, started on dysphagia 3 diet, continue with all aspiration precautions. Continue to work with PT OT and speech at the nursing home. Neurology following.  Office will schedule outpatient follow-up.   Acute metabolic infective encephalopathy, suspected UTI: Urine culture with 80,000 colonies of E. coli.  Urinary retention.  Will treat as symptomatic UTI.  Rocephin day 3 today.  Will treat with cephalosporin for 6 additional days.  Started Flomax.  Discharged with Foley catheter in place due to significant retention.   Voiding trial in 2 weeks.  If further retention, Place Foley catheter and refer to urology.     Essential hypertension: Blood pressure better On Coreg.    AKI on CKD stage IIIa: Improved.  Urine output is adequate.   Type 2 diabetes, well-controlled.  A1c 7.1.  Keep on insulin.  Hypomagnesemia and hypokalemia: Replaced before discharge.  Will also keep on scheduled magnesium replacement.   Goal of care discussion was held with family.  Currently decided full scope of treatment.  Attempt to rehab at the nursing home.  Stable to transfer back to nursing home.  Discharge Diagnoses:  Principal Problem:  Acute metabolic encephalopathy Active Problems:   Hyperlipidemia LDL goal  <70   Essential hypertension   PAD (peripheral artery disease) (HCC)   Type II diabetes mellitus   Stage 3b chronic kidney disease   Goals of care, counseling/discussion    Discharge Instructions  Discharge Instructions     Ambulatory referral to Neurology   Complete by: As directed    Follow up with Dr. Leta Baptist at [facility] in 4-6 weeks. Pt is Dr. Gladstone Lighter pt. Thanks.   Diet - low sodium heart healthy   Complete by: As directed    Diet Carb Modified   Complete by: As directed    Increase activity slowly   Complete by: As directed       Allergies as of 06/05/2022       Reactions   Lipitor [atorvastatin] Other (See Comments)   Myalgia   Statins Other (See Comments)   Myalgia (CAN tolerate Crestor, however)   Pravachol [pravastatin] Rash        Medication List     TAKE these medications    aspirin EC 81 MG tablet Take 1 tablet (81 mg total) by mouth every evening. What changed: when to take this   carvedilol 12.5 MG tablet Commonly known as: COREG Take 1 tablet (12.5 mg total) by mouth 2 (two) times daily with a meal.   cefadroxil 500 MG capsule Commonly known as: DURICEF Take 2 capsules (1,000 mg total) by mouth 2 (two) times daily for 6 days.   clopidogrel 75 MG tablet Commonly known as: PLAVIX Take 1 tablet (75 mg total) by mouth daily. Start taking on: June 06, 2022   docusate sodium 100 MG capsule Commonly known as: COLACE Take 100 mg by mouth 2 (two) times daily.   ferrous gluconate 324 MG tablet Commonly known as: FERGON Take 324 mg by mouth in the morning and at bedtime.   GenTeal Severe 0.3 % Gel ophthalmic ointment Generic drug: hypromellose Place 1 Application into both eyes 3 (three) times daily.   insulin aspart 100 UNIT/ML injection Commonly known as: novoLOG Inject 0-9 Units into the skin 3 (three) times daily with meals. CBG 121 - 150: 1 unit CBG 151 - 200: 2 units CBG 201 - 250: 3 units CBG 251 - 300: 5 units CBG 301 - 350: 7  units CBG 351 - 400 9 units CBG > 400 call MD and obtain STAT lab verification What changed:  how much to take when to take this additional instructions   insulin detemir 100 UNIT/ML injection Commonly known as: LEVEMIR Inject 8 Units into the skin daily.   insulin lispro 100 UNIT/ML injection Commonly known as: HUMALOG Inject 0-12 Units into the skin See admin instructions. Per sliding scale: If blood sugar is less than 70, call NP/PA. BG is 201 to 250 give 2 units. BG is 251 to 300 give 4 units. BG is 301 to 350  give 6 units. BG is 351 to 400 give 8 units. BG is 401 to 450 give 10 units. BG is 451 to 600 give 12 units.   losartan 25 MG tablet Commonly known as: COZAAR Take 12.5 mg by mouth daily as needed (blood pressure). >155 or dBP >100   magnesium oxide 400 (240 Mg) MG tablet Commonly known as: MAG-OX Take 0.5 tablets (200 mg total) by mouth daily for 14 days. Start taking on: June 06, 2022   polyethylene glycol 17 g packet Commonly known as: MIRALAX / GLYCOLAX Take 17 g by mouth  daily.   pyridostigmine 60 MG tablet Commonly known as: MESTINON Take 0.5 tablets (30 mg total) by mouth every 8 (eight) hours.   rosuvastatin 5 MG tablet Commonly known as: CRESTOR Take 1 tablet (5 mg total) by mouth every evening.   tamsulosin 0.4 MG Caps capsule Commonly known as: FLOMAX Take 1 capsule (0.4 mg total) by mouth daily. Start taking on: June 06, 2022        Contact information for follow-up providers     [name], Earlean Polka, MD. Schedule an appointment as soon as possible for a visit in 1 month(s).   Specialties: Neurology, Radiology Contact information: [address] [place] [address] [phone]              Contact information for after-discharge care     [facility] .   Service: Skilled Nursing Contact information: [place]  [place] [phone]                    Allergies  Allergen Reactions   Lipitor [Atorvastatin] Other (See Comments)    Myalgia    Statins Other (See Comments)    Myalgia (CAN tolerate Crestor, however)   Pravachol [Pravastatin] Rash    Consultations: Neurology Palliative care   Procedures/Studies: EEG adult  Result Date: 06/05/22 Lora Havens, MD     05-Jun-2022  9:03 AM Patient Name: Harry [name] MRN: [id number] Epilepsy Attending: Lora Havens Referring Physician/Provider: Rosalin Hawking, MD Date: 06/03/2022 Duration: 30.25 mins Patient history: 77yo M with ams. EEG to evaluate for seizure Level of alertness: Awake AEDs during EEG study: None Technical aspects: This EEG study was done with scalp electrodes positioned according to the 10-20 International system of electrode placement. Electrical activity was reviewed with band pass filter of 1-70Hz , sensitivity of 7 uV/mm, display speed of 71mm/sec with a 60Hz  notched filter applied as appropriate. EEG data were recorded continuously and digitally stored.  Video monitoring was available and reviewed as appropriate. Description: The posterior dominant rhythm consists of 8 Hz activity of moderate voltage (25-35 uV) seen predominantly in posterior head regions, symmetric and reactive to eye opening and eye closing. Intermittent generalized 3-5Hz  theta-delta slowing was noted. Physiologic photic driving was not seen during photic stimulation.  Hyperventilation was not performed.   ABNORMALITY - Intermittent slow, generalized IMPRESSION: This study is suggestive of mild diffuse encephalopathy, nonspecific etiology. No seizures or epileptiform discharges were seen throughout the recording. Lora Havens   ECHOCARDIOGRAM COMPLETE  Result Date: 06/03/2022    ECHOCARDIOGRAM REPORT   Patient Name:   Harry [name] Date of Exam: 06/03/2022 Medical Rec #:  [id number]        Height:       68.0 in Accession #:    [id number]        Weight:       185.2 lb Date of Birth:  1945/08/09         BSA:          1.978 m Patient Age:    68 years         BP:           180/62 mmHg Patient Gender: M                HR:           92 bpm. Exam Location:  Inpatient Procedure: 2D Echo, Cardiac Doppler  and Color Doppler Indications:    Stroke  History:        Patient has prior history of Echocardiogram examinations, most                 recent 10/21/2019. Stroke and PVD, Signs/Symptoms:Altered Mental                 Status and Hypotension; Risk Factors:Diabetes, Former Smoker,                 Hypertension and Dyslipidemia. CKD.  Sonographer:    Eartha Inch Referring Phys: [id number] [place]  Sonographer Comments: Technically challenging study due to limited acoustic windows. Image acquisition challenging due to patient body habitus and Image acquisition challenging due to respiratory motion. IMPRESSIONS  1. Left ventricular ejection fraction, by estimation, is 60 to 65%. The left ventricle has normal function. The left ventricle has no regional wall motion abnormalities. There is mild left ventricular hypertrophy. Left ventricular diastolic parameters are consistent with Grade I diastolic dysfunction (impaired relaxation).  2. Right ventricular systolic function is normal. The right ventricular size is normal. Tricuspid regurgitation signal is inadequate for assessing PA pressure.  3. The mitral valve is normal in structure. Trivial mitral valve regurgitation.  4. The aortic valve is tricuspid. There is mild calcification of the aortic valve. There is mild thickening of the aortic valve. Aortic valve regurgitation is not visualized. Aortic valve sclerosis/calcification is present, without any evidence of aortic stenosis.  5. Aortic dilatation noted. There is borderline dilatation of the aortic root, measuring 37 mm. There is borderline dilatation of the ascending aorta, measuring 39 mm.  6. The inferior vena cava is normal in size with greater than  50% respiratory variability, suggesting right atrial pressure of 3 mmHg. Comparison(s): No significant change from prior study. Conclusion(s)/Recommendation(s): No intracardiac source of embolism detected on this transthoracic study. Consider a transesophageal echocardiogram to exclude cardiac source of embolism if clinically indicated. FINDINGS  Left Ventricle: Left ventricular ejection fraction, by estimation, is 60 to 65%. The left ventricle has normal function. The left ventricle has no regional wall motion abnormalities. The left ventricular internal cavity size was normal in size. There is  mild left ventricular hypertrophy. Left ventricular diastolic parameters are consistent with Grade I diastolic dysfunction (impaired relaxation). Right Ventricle: The right ventricular size is normal. No increase in right ventricular wall thickness. Right ventricular systolic function is normal. Tricuspid regurgitation signal is inadequate for assessing PA pressure. Left Atrium: Left atrial size was normal in size. Right Atrium: Right atrial size was normal in size. Pericardium: Trivial pericardial effusion is present. The pericardial effusion is anterior to the right ventricle. Mitral Valve: The mitral valve is normal in structure. Trivial mitral valve regurgitation. Tricuspid Valve: The tricuspid valve is normal in structure. Tricuspid valve regurgitation is trivial. Aortic Valve: The aortic valve is tricuspid. There is mild calcification of the aortic valve. There is mild thickening of the aortic valve. Aortic valve regurgitation is not visualized. Aortic valve sclerosis/calcification is present, without any evidence of aortic stenosis. Pulmonic Valve: The pulmonic valve was normal in structure. Pulmonic valve regurgitation is not visualized. Aorta: Aortic dilatation noted. There is borderline dilatation of the aortic root, measuring 37 mm. There is borderline dilatation of the ascending aorta, measuring 39 mm. Venous:  The inferior vena cava is normal in size with greater than 50% respiratory variability, suggesting right atrial pressure of 3 mmHg. IAS/Shunts: The atrial septum is grossly normal.  LEFT  VENTRICLE PLAX 2D LVIDd:         3.70 cm     Diastology LVIDs:         2.40 cm     LV e' medial:  4.68 cm/s LV PW:         1.50 cm     LV e' lateral: 3.81 cm/s LV IVS:        1.60 cm LVOT diam:     2.10 cm LV SV:         65 LV SV Index:   33 LVOT Area:     3.46 cm  LV Volumes (MOD) LV vol d, MOD A2C: 44.3 ml LV vol d, MOD A4C: 58.0 ml LV vol s, MOD A2C: 16.4 ml LV vol s, MOD A4C: 17.3 ml LV SV MOD A2C:     27.9 ml LV SV MOD A4C:     58.0 ml LV SV MOD BP:      35.6 ml RIGHT VENTRICLE RV S prime:     12.30 cm/s LEFT ATRIUM             Index        RIGHT ATRIUM          Index LA diam:        3.50 cm 1.77 cm/m   RA Area:     7.92 cm LA Vol (A2C):   43.8 ml 22.14 ml/m  RA Volume:   11.00 ml 5.56 ml/m LA Vol (A4C):   45.4 ml 22.95 ml/m LA Biplane Vol: 47.3 ml 23.91 ml/m  AORTIC VALVE LVOT Vmax:   101.00 cm/s LVOT Vmean:  69.700 cm/s LVOT VTI:    0.189 m  AORTA Ao Root diam: 3.70 cm Ao Asc diam:  3.90 cm  SHUNTS Systemic VTI:  0.19 m Systemic Diam: 2.10 cm Gwyndolyn Kaufman MD Electronically signed by Gwyndolyn Kaufman MD Signature Date/Time: 06/03/2022/3:20:36 PM    Final    MR BRAIN WO CONTRAST  Result Date: 06/02/2022 CLINICAL DATA:  Stroke follow-up EXAM: MRI HEAD WITHOUT CONTRAST TECHNIQUE: Multiplanar, multiecho pulse sequences of the brain and surrounding structures were obtained without intravenous contrast. COMPARISON:  None Available. FINDINGS: Brain: Small punctate focus of acute infarct in the cerebellar vermis (series 5, image 67) in in the parafalcine inferior right frontal lobe (series 5, image 73). Redemonstrated are chronic infarcts in the bilateral cerebellar hemispheres, brainstem, right thalamus, in in the medial left occipital lobe. Sequela of severe chronic microvascular ischemic change. No hydrocephalus. No  extra-axial fluid collection. There are small punctate foci of microhemorrhage in the bilateral cerebellar hemispheres, and the thalami, possibly hypertensive in etiology. Advanced generalized volume loss. Vascular: Normal flow voids. Skull and upper cervical spine: Normal marrow signal. Sinuses/Orbits: No mastoid or middle ear effusion. Bilateral lens replacement. Orbits are otherwise unremarkable. Mild pansinus mucosal thickening, greatest in the bilateral ethmoid sinuses. Other: None. IMPRESSION: Punctate acute infarcts in the cerebellar vermis and parafalcine inferior right frontal lobe. No hemorrhage. Electronically Signed   By: Marin Roberts M.D.   On: 06/02/2022 11:20   DG Hip Unilat W or Wo Pelvis 2-3 Views Left  Result Date: 06/02/2022 CLINICAL DATA:  clearance for MRI. EXAM: DG HIP (WITH OR WITHOUT PELVIS) 2-3V LEFT COMPARISON:  None Available. FINDINGS: No radiopaque or metallic foreign bodies identified within the pelvis or left hip. Mild degenerative changes noted within both hip joints. No acute fracture or dislocation. Contrast material noted within the bladder. IMPRESSION: 1. No radiopaque or metallic foreign  bodies identified. 2. Mild bilateral hip osteoarthritis. Electronically Signed   By: Kerby Moors M.D.   On: 06/02/2022 10:03   CT Chest Wo Contrast  Result Date: 06/02/2022 CLINICAL DATA:  77 year old male with history of chest wall pain. EXAM: CT CHEST WITHOUT CONTRAST TECHNIQUE: Multidetector CT imaging of the chest was performed following the standard protocol without IV contrast. RADIATION DOSE REDUCTION: This exam was performed according to the departmental dose-optimization program which includes automated exposure control, adjustment of the mA and/or kV according to patient size and/or use of iterative reconstruction technique. COMPARISON:  No priors. FINDINGS: Comment: Limited study secondary to the patient being imaged in the left lateral decubitus position. Cardiovascular:  Heart size is normal. There is no significant pericardial fluid, thickening or pericardial calcification. Atherosclerotic calcifications of the left anterior descending, left circumflex and right coronary arteries. Mediastinum/Nodes: No pathologically enlarged mediastinal or hilar lymph nodes. Please note that accurate exclusion of hilar adenopathy is limited on noncontrast CT scans. Esophagus is unremarkable in appearance. No axillary lymphadenopathy. Lungs/Pleura: Low volume left lung secondary to patient positioning with apparent areas of subsegmental atelectasis throughout the left lung. No definite confluent consolidative airspace disease. No pleural effusions. Poorly defined nodule in the periphery of the right upper lobe (axial image 70 of series 8) measuring 1.0 x 0.5 cm. Upper Abdomen: High attenuation material in the collecting systems of both kidneys related to recent contrast-enhanced head CTA. Aortic atherosclerosis. Musculoskeletal: There are no aggressive appearing lytic or blastic lesions noted in the visualized portions of the skeleton. IMPRESSION: 1. No acute findings in the thorax to account for the patient's symptoms on today's limited examination. 2. Extensive dependent atelectasis in the left lung likely secondary to patient positioning. 3. Aortic atherosclerosis, in addition to three-vessel coronary artery disease. Assessment for potential risk factor modification, dietary therapy or pharmacologic therapy may be warranted, if clinically indicated. Aortic Atherosclerosis (ICD10-I70.0). Electronically Signed   By: Vinnie Langton M.D.   On: 06/02/2022 06:45   CT ANGIO HEAD NECK W WO CM  Result Date: 06/02/2022 CLINICAL DATA:  Left-sided weakness and dysarthria EXAM: CT ANGIOGRAPHY HEAD AND NECK TECHNIQUE: Multidetector CT imaging of the head and neck was performed using the standard protocol during bolus administration of intravenous contrast. Multiplanar CT image reconstructions and MIPs  were obtained to evaluate the vascular anatomy. Carotid stenosis measurements (when applicable) are obtained utilizing NASCET criteria, using the distal internal carotid diameter as the denominator. RADIATION DOSE REDUCTION: This exam was performed according to the departmental dose-optimization program which includes automated exposure control, adjustment of the mA and/or kV according to patient size and/or use of iterative reconstruction technique. CONTRAST:  33mL OMNIPAQUE IOHEXOL 350 MG/ML SOLN COMPARISON:  MRA head 08/12/2018 FINDINGS: CT HEAD FINDINGS Brain: There is no mass, hemorrhage or extra-axial collection. Generalized volume loss. Old bilateral cerebellar and left occipital infarcts. There is hypoattenuation of the periventricular white matter, most commonly indicating chronic ischemic microangiopathy. Skull: The visualized skull base, calvarium and extracranial soft tissues are normal. Sinuses/Orbits: No fluid levels or advanced mucosal thickening of the visualized paranasal sinuses. No mastoid or middle ear effusion. The orbits are normal. CTA NECK FINDINGS SKELETON: There is no bony spinal canal stenosis. No lytic or blastic lesion. OTHER NECK: Normal pharynx, larynx and major salivary glands. No cervical lymphadenopathy. Unremarkable thyroid gland. UPPER CHEST: No pneumothorax or pleural effusion. No nodules or masses. AORTIC ARCH: There is calcific atherosclerosis of the aortic arch. There is no aneurysm, dissection or hemodynamically significant stenosis  of the visualized portion of the aorta. Conventional 3 vessel aortic branching pattern. The visualized proximal subclavian arteries are widely patent. RIGHT CAROTID SYSTEM: No dissection, occlusion or aneurysm. Mild atherosclerotic calcification at the carotid bifurcation without hemodynamically significant stenosis. LEFT CAROTID SYSTEM: No dissection, occlusion or aneurysm. Mild atherosclerotic calcification at the carotid bifurcation without  hemodynamically significant stenosis. VERTEBRAL ARTERIES: Left dominant configuration. Both origins are clearly patent. There is no dissection, occlusion or flow-limiting stenosis to the skull base (V1-V3 segments). CTA HEAD FINDINGS POSTERIOR CIRCULATION: --Vertebral arteries: Normal V4 segments. --Inferior cerebellar arteries: Normal. --Basilar artery: Normal. --Superior cerebellar arteries: Normal. --Posterior cerebral arteries (PCA): Unchanged severe stenosis of the distal right P1 segment ANTERIOR CIRCULATION: --Intracranial internal carotid arteries: There is severe, near occlusive stenosis of the cavernous segment the right ICA. Atherosclerotic calcification of the left ICA without high-grade stenosis. --Anterior cerebral arteries (ACA): Normal. Both A1 segments are present. Patent anterior communicating artery (a-comm). --Middle cerebral arteries (MCA): Mild stenosis of the right MCA M2 segment superior division. Left MCA normal. ANATOMIC VARIANTS: None Review of the MIP images confirms the above findings. IMPRESSION: 1. No emergent large vessel occlusion. 2. 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 Allowing for differences in modality, this appears unchanged compared to the 08/12/2018 MRA. 3. Old bilateral cerebellar and left occipital infarcts. Aortic atherosclerosis (ICD10-I70.0). Electronically Signed   By: Ulyses Jarred M.D.   On: 06/02/2022 04:02   DG Chest Portable 1 View  Result Date: 06/01/2022 CLINICAL DATA:  Cough EXAM: PORTABLE CHEST 1 VIEW COMPARISON:  06/06/2020 FINDINGS: Heart and mediastinal contours within normal limits. Increased markings in the lung bases, left greater than right. No effusions. No acute bony abnormality. IMPRESSION: Increased markings in the lung bases could reflect atelectasis or early infiltrates. Electronically Signed   By: Rolm Baptise M.D.   On: 06/01/2022 22:02   (Echo, Carotid, EGD, Colonoscopy, ERCP)    Subjective: Patient  seen and examined.  He is eating breakfast by himself.  Poor historian but denies any complaints.  No other overnight events.   Discharge Exam: Vitals:   06/05/22 0745 06/05/22 0800  BP: (!) 159/81   Pulse: 76   Resp: 20   Temp: 97.6 F (36.4 C)   SpO2: 100% 98%   Vitals:   06/05/22 0033 06/05/22 0330 06/05/22 0745 06/05/22 0800  BP: (!) 170/84 (!) 165/84 (!) 159/81   Pulse:  79 76   Resp:  20 20   Temp:  (!) 97.4 F (36.3 C) 97.6 F (36.4 C)   TempSrc:  Oral Oral   SpO2:  100% 100% 98%  Weight:      Height:       General exam: Appears frail and debilitated.  Chronically sick looking.  Not in any distress. Respiratory system: No added sounds. Cardiovascular system: S1-S2 normal.  No added sounds.   Gastrointestinal system: Soft and nontender. Central nervous system:  Looks comfortable.  Alert and able to keep up simple conversation. Left eye is opaque.  Right eye is open and normal vision. He is generally weak.  No appreciable asymmetrical weakness. Foley catheter with muddy colored urine.      The results of significant diagnostics from this hospitalization (including imaging, microbiology, ancillary and laboratory) are listed below for reference.     Microbiology: Recent Results (from the past 240 hour(s))  Blood Culture (routine x 2)     Status: None (Preliminary result)   Collection Time: 06/01/22  9:43 PM  Specimen: BLOOD RIGHT FOREARM  Result Value Ref Range Status   Specimen Description BLOOD RIGHT FOREARM  Final   Special Requests   Final    BOTTLES DRAWN AEROBIC AND ANAEROBIC Blood Culture adequate volume   Culture   Final    NO GROWTH 4 DAYS Performed at [facility], [address] [place], [place] [address]    Report Status PENDING  Incomplete  Blood Culture (routine x 2)     Status: None (Preliminary result)   Collection Time: 06/01/22  9:47 PM   Specimen: BLOOD  Result Value Ref Range Status   Specimen Description BLOOD RIGHT  ANTECUBITAL  Final   Special Requests   Final    BOTTLES DRAWN AEROBIC AND ANAEROBIC Blood Culture adequate volume   Culture   Final    NO GROWTH 4 DAYS Performed at [facility], [place] [address] [place], [place] [address]    Report Status PENDING  Incomplete  SARS Coronavirus 2 by RT PCR (hospital order, performed in [facility] hospital lab) *cepheid single result test* Anterior Nasal Swab     Status: None   Collection Time: 06/01/22 10:39 PM   Specimen: Anterior Nasal Swab  Result Value Ref Range Status   SARS Coronavirus 2 by RT PCR NEGATIVE NEGATIVE Final    Comment: Performed at [facility], [place] [address], [place], [place] [address]  Urine Culture (for pregnant, neutropenic or urologic patients or patients with an indwelling urinary catheter)     Status: Abnormal   Collection Time: 06/02/22  4:43 AM   Specimen: Urine, Clean Catch  Result Value Ref Range Status   Specimen Description URINE, CLEAN CATCH  Final   Special Requests   Final    NONE Performed at [facility], [place] [address], [place], [place] [address]    Culture 80,000 COLONIES/mL ESCHERICHIA COLI (A)  Final   Report Status 06/04/2022 FINAL  Final   Organism ID, Bacteria ESCHERICHIA COLI (A)  Final      Susceptibility   Escherichia coli - MIC*    AMPICILLIN 8 SENSITIVE Sensitive     CEFAZOLIN <=4 SENSITIVE Sensitive     CEFEPIME <=0.12 SENSITIVE Sensitive     CEFTRIAXONE <=0.25 SENSITIVE Sensitive     CIPROFLOXACIN <=0.25 SENSITIVE Sensitive     GENTAMICIN <=1 SENSITIVE Sensitive     IMIPENEM <=0.25 SENSITIVE Sensitive     NITROFURANTOIN <=16 SENSITIVE Sensitive     TRIMETH/SULFA <=20 SENSITIVE Sensitive     AMPICILLIN/SULBACTAM 4 SENSITIVE Sensitive     PIP/TAZO <=4 SENSITIVE Sensitive     * 80,000 COLONIES/mL ESCHERICHIA COLI     Labs: BNP (last 3 results) Recent Labs    06/01/22 2119  BNP [id number]*   Basic Metabolic Panel: Recent Labs  Lab 06/01/22 2119 06/01/22 2134  06/03/22 1227 06/05/22 0518  NA 141 143 143 140  K 3.4* 3.3* 3.4* 3.3*  CL 102  --  105 103  CO2 26  --  24 26  GLUCOSE 277*  --  127* 165*  BUN 19  --  16 11  CREATININE 1.83*  --  1.28* 1.12  CALCIUM 8.8*  --  8.7* 8.1*  MG  --   --   --  1.5*  PHOS  --   --   --  2.9   Liver Function Tests: Recent Labs  Lab 06/01/22 2119 06/03/22 1227 06/05/22 0518  AST 22 18 14*  ALT 17 15 10  ALKPHOS 60 64 50  BILITOT 1.1 1.2 0.9  PROT 6.6 7.1 6.0*  ALBUMIN 3.0* 3.2* 2.5*   No results for input(s): "LIPASE", "AMYLASE" in the last 168 hours. Recent Labs  Lab 06/03/22 1227  AMMONIA 16   CBC: Recent Labs  Lab 06/01/22 2119 06/01/22 2134 06/03/22 1227 06/05/22 0518  WBC 13.7*  --  7.5 5.3  NEUTROABS 11.4*  --   --  3.2  HGB 10.7* 11.6* 10.8* 9.4*  HCT 32.8* 34.0* 34.9* 29.1*  MCV 90.6  --  93.1 90.7  PLT 205  --  210 182   Cardiac Enzymes: No results for input(s): "CKTOTAL", "CKMB", "CKMBINDEX", "TROPONINI" in the last 168 hours. BNP: Invalid input(s): "POCBNP" CBG: Recent Labs  Lab 06/04/22 0615 06/04/22 1109 06/04/22 1647 06/04/22 2054 06/05/22 0616  GLUCAP 107* 272* 181* 169* 168*   D-Dimer No results for input(s): "DDIMER" in the last 72 hours. Hgb A1c Recent Labs    06/02/22 1310  HGBA1C 6.5*   Lipid Profile Recent Labs    06/03/22 1227  CHOL 99  HDL 27*  LDLCALC 54  TRIG 90  CHOLHDL 3.7   Thyroid function studies Recent Labs    06/03/22 1227  TSH 3.668   Anemia work up Recent Labs    06/03/22 1227  VITAMINB12 658   Urinalysis    Component Value Date/Time   COLORURINE YELLOW 06/01/2022 0443   APPEARANCEUR HAZY (A) 06/01/2022 0443   LABSPEC 1.031 (H) 06/01/2022 0443   PHURINE 5.0 06/01/2022 0443   GLUCOSEU NEGATIVE 06/01/2022 0443   HGBUR MODERATE (A) 06/01/2022 0443   BILIRUBINUR NEGATIVE 06/01/2022 0443   KETONESUR NEGATIVE 06/01/2022 0443   PROTEINUR 100 (A) 06/01/2022 0443   UROBILINOGEN 0.2 10/09/2014 2112   NITRITE  NEGATIVE 06/01/2022 0443   LEUKOCYTESUR MODERATE (A) 06/01/2022 0443   Sepsis Labs Recent Labs  Lab 06/01/22 2119 06/03/22 1227 06/05/22 0518  WBC 13.7* 7.5 5.3   Microbiology Recent Results (from the past 240 hour(s))  Blood Culture (routine x 2)     Status: None (Preliminary result)   Collection Time: 06/01/22  9:43 PM   Specimen: BLOOD RIGHT FOREARM  Result Value Ref Range Status   Specimen Description BLOOD RIGHT FOREARM  Final   Special Requests   Final    BOTTLES DRAWN AEROBIC AND ANAEROBIC Blood Culture adequate volume   Culture   Final    NO GROWTH 4 DAYS Performed at [facility], [place] [address], [place], [place] [address]    Report Status PENDING  Incomplete  Blood Culture (routine x 2)     Status: None (Preliminary result)   Collection Time: 06/01/22  9:47 PM   Specimen: BLOOD  Result Value Ref Range Status   Specimen Description BLOOD RIGHT ANTECUBITAL  Final   Special Requests   Final    BOTTLES DRAWN AEROBIC AND ANAEROBIC Blood Culture adequate volume   Culture   Final    NO GROWTH 4 DAYS Performed at [facility], [place] [address], [place], [place] [address]    Report Status PENDING  Incomplete  SARS Coronavirus 2 by RT PCR (hospital order, performed in [facility] hospital lab) *cepheid single result test* Anterior Nasal Swab     Status: None   Collection Time: 06/01/22 10:39 PM   Specimen: Anterior Nasal Swab  Result Value Ref Range Status   SARS Coronavirus 2 by RT PCR NEGATIVE NEGATIVE Final    Comment: Performed at [facility],  [address] [place], [place] [address]  Urine Culture (for pregnant, neutropenic or urologic patients or patients with an indwelling urinary catheter)     Status: Abnormal   Collection Time: 06/02/22  4:43 AM   Specimen: Urine, Clean Catch  Result Value Ref Range Status   Specimen Description URINE, CLEAN CATCH  Final   Special Requests   Final    NONE Performed at [facility], [place] [address] [place], [place] [address]    Culture 80,000 COLONIES/mL ESCHERICHIA COLI (A)  Final   Report Status 06/04/2022 FINAL  Final   Organism ID, Bacteria ESCHERICHIA COLI (A)  Final      Susceptibility   Escherichia coli - MIC*    AMPICILLIN 8 SENSITIVE Sensitive     CEFAZOLIN <=4 SENSITIVE Sensitive     CEFEPIME <=0.12 SENSITIVE Sensitive     CEFTRIAXONE <=0.25 SENSITIVE Sensitive     CIPROFLOXACIN <=0.25 SENSITIVE Sensitive     GENTAMICIN <=1 SENSITIVE Sensitive     IMIPENEM <=0.25 SENSITIVE Sensitive     NITROFURANTOIN <=16 SENSITIVE Sensitive     TRIMETH/SULFA <=20 SENSITIVE Sensitive     AMPICILLIN/SULBACTAM 4 SENSITIVE Sensitive     PIP/TAZO <=4 SENSITIVE Sensitive     * 80,000 COLONIES/mL ESCHERICHIA COLI     Time coordinating discharge:  40 minutes  SIGNED:   Barb Merino, MD  [facility] 06/05/2022, 10:17 AM

## 2022-06-06 LAB — CULTURE, BLOOD (ROUTINE X 2)
Culture: NO GROWTH
Culture: NO GROWTH
Special Requests: ADEQUATE
Special Requests: ADEQUATE

## 2022-08-13 ENCOUNTER — Inpatient Hospital Stay: Payer: Medicare Other | Admitting: Diagnostic Neuroimaging

## 2022-09-06 ENCOUNTER — Emergency Department (HOSPITAL_COMMUNITY): Payer: Medicare Other

## 2022-09-06 ENCOUNTER — Encounter (HOSPITAL_COMMUNITY): Payer: Self-pay | Admitting: Emergency Medicine

## 2022-09-06 ENCOUNTER — Inpatient Hospital Stay (HOSPITAL_COMMUNITY)
Admission: EM | Admit: 2022-09-06 | Discharge: 2022-09-17 | DRG: 871 | Disposition: A | Discharge status: Other Acute Inpatient Hospital | Payer: Medicare Other | Source: Skilled Nursing Facility | Attending: Internal Medicine | Admitting: Internal Medicine

## 2022-09-06 ENCOUNTER — Other Ambulatory Visit: Payer: Self-pay

## 2022-09-06 DIAGNOSIS — E119 Type 2 diabetes mellitus without complications: Secondary | ICD-10-CM | POA: Diagnosis not present

## 2022-09-06 DIAGNOSIS — E785 Hyperlipidemia, unspecified: Secondary | ICD-10-CM | POA: Diagnosis present

## 2022-09-06 DIAGNOSIS — F0150 Vascular dementia without behavioral disturbance: Secondary | ICD-10-CM | POA: Diagnosis present

## 2022-09-06 DIAGNOSIS — Z1152 Encounter for screening for COVID-19: Secondary | ICD-10-CM

## 2022-09-06 DIAGNOSIS — E1136 Type 2 diabetes mellitus with diabetic cataract: Secondary | ICD-10-CM | POA: Diagnosis present

## 2022-09-06 DIAGNOSIS — Z87891 Personal history of nicotine dependence: Secondary | ICD-10-CM

## 2022-09-06 DIAGNOSIS — E860 Dehydration: Secondary | ICD-10-CM | POA: Diagnosis present

## 2022-09-06 DIAGNOSIS — F0390 Unspecified dementia without behavioral disturbance: Secondary | ICD-10-CM | POA: Diagnosis present

## 2022-09-06 DIAGNOSIS — E538 Deficiency of other specified B group vitamins: Secondary | ICD-10-CM

## 2022-09-06 DIAGNOSIS — R5381 Other malaise: Secondary | ICD-10-CM | POA: Diagnosis not present

## 2022-09-06 DIAGNOSIS — I129 Hypertensive chronic kidney disease with stage 1 through stage 4 chronic kidney disease, or unspecified chronic kidney disease: Secondary | ICD-10-CM | POA: Diagnosis present

## 2022-09-06 DIAGNOSIS — R4182 Altered mental status, unspecified: Secondary | ICD-10-CM

## 2022-09-06 DIAGNOSIS — N183 Chronic kidney disease, stage 3 unspecified: Secondary | ICD-10-CM | POA: Diagnosis not present

## 2022-09-06 DIAGNOSIS — Z7982 Long term (current) use of aspirin: Secondary | ICD-10-CM

## 2022-09-06 DIAGNOSIS — Z8673 Personal history of transient ischemic attack (TIA), and cerebral infarction without residual deficits: Secondary | ICD-10-CM | POA: Diagnosis not present

## 2022-09-06 DIAGNOSIS — R7881 Bacteremia: Secondary | ICD-10-CM

## 2022-09-06 DIAGNOSIS — R195 Other fecal abnormalities: Secondary | ICD-10-CM

## 2022-09-06 DIAGNOSIS — A4189 Other specified sepsis: Principal | ICD-10-CM | POA: Diagnosis present

## 2022-09-06 DIAGNOSIS — K922 Gastrointestinal hemorrhage, unspecified: Secondary | ICD-10-CM | POA: Diagnosis not present

## 2022-09-06 DIAGNOSIS — E872 Acidosis, unspecified: Secondary | ICD-10-CM | POA: Diagnosis present

## 2022-09-06 DIAGNOSIS — G3183 Dementia with Lewy bodies: Secondary | ICD-10-CM | POA: Diagnosis not present

## 2022-09-06 DIAGNOSIS — N1832 Chronic kidney disease, stage 3b: Secondary | ICD-10-CM | POA: Diagnosis not present

## 2022-09-06 DIAGNOSIS — K8020 Calculus of gallbladder without cholecystitis without obstruction: Secondary | ICD-10-CM | POA: Diagnosis present

## 2022-09-06 DIAGNOSIS — E1151 Type 2 diabetes mellitus with diabetic peripheral angiopathy without gangrene: Secondary | ICD-10-CM | POA: Diagnosis present

## 2022-09-06 DIAGNOSIS — Z9862 Peripheral vascular angioplasty status: Secondary | ICD-10-CM | POA: Diagnosis not present

## 2022-09-06 DIAGNOSIS — A419 Sepsis, unspecified organism: Secondary | ICD-10-CM | POA: Diagnosis not present

## 2022-09-06 DIAGNOSIS — N1831 Chronic kidney disease, stage 3a: Secondary | ICD-10-CM | POA: Diagnosis not present

## 2022-09-06 DIAGNOSIS — D631 Anemia in chronic kidney disease: Secondary | ICD-10-CM | POA: Diagnosis present

## 2022-09-06 DIAGNOSIS — Z7902 Long term (current) use of antithrombotics/antiplatelets: Secondary | ICD-10-CM

## 2022-09-06 DIAGNOSIS — Z794 Long term (current) use of insulin: Secondary | ICD-10-CM | POA: Diagnosis not present

## 2022-09-06 DIAGNOSIS — D649 Anemia, unspecified: Secondary | ICD-10-CM | POA: Diagnosis present

## 2022-09-06 DIAGNOSIS — Z888 Allergy status to other drugs, medicaments and biological substances status: Secondary | ICD-10-CM

## 2022-09-06 DIAGNOSIS — I739 Peripheral vascular disease, unspecified: Secondary | ICD-10-CM | POA: Diagnosis present

## 2022-09-06 DIAGNOSIS — R911 Solitary pulmonary nodule: Secondary | ICD-10-CM | POA: Diagnosis present

## 2022-09-06 DIAGNOSIS — J180 Bronchopneumonia, unspecified organism: Secondary | ICD-10-CM | POA: Diagnosis present

## 2022-09-06 DIAGNOSIS — Z95820 Peripheral vascular angioplasty status with implants and grafts: Secondary | ICD-10-CM | POA: Diagnosis not present

## 2022-09-06 DIAGNOSIS — H409 Unspecified glaucoma: Secondary | ICD-10-CM | POA: Diagnosis present

## 2022-09-06 DIAGNOSIS — E1122 Type 2 diabetes mellitus with diabetic chronic kidney disease: Secondary | ICD-10-CM | POA: Diagnosis not present

## 2022-09-06 DIAGNOSIS — J189 Pneumonia, unspecified organism: Secondary | ICD-10-CM

## 2022-09-06 DIAGNOSIS — B954 Other streptococcus as the cause of diseases classified elsewhere: Secondary | ICD-10-CM | POA: Diagnosis not present

## 2022-09-06 DIAGNOSIS — I69354 Hemiplegia and hemiparesis following cerebral infarction affecting left non-dominant side: Secondary | ICD-10-CM

## 2022-09-06 DIAGNOSIS — I10 Essential (primary) hypertension: Secondary | ICD-10-CM | POA: Diagnosis present

## 2022-09-06 DIAGNOSIS — F0280 Dementia in other diseases classified elsewhere without behavioral disturbance: Secondary | ICD-10-CM | POA: Diagnosis present

## 2022-09-06 DIAGNOSIS — G9341 Metabolic encephalopathy: Secondary | ICD-10-CM | POA: Diagnosis present

## 2022-09-06 DIAGNOSIS — H5462 Unqualified visual loss, left eye, normal vision right eye: Secondary | ICD-10-CM | POA: Diagnosis present

## 2022-09-06 DIAGNOSIS — G9340 Encephalopathy, unspecified: Secondary | ICD-10-CM | POA: Diagnosis not present

## 2022-09-06 DIAGNOSIS — N3001 Acute cystitis with hematuria: Secondary | ICD-10-CM | POA: Diagnosis present

## 2022-09-06 DIAGNOSIS — N179 Acute kidney failure, unspecified: Secondary | ICD-10-CM | POA: Diagnosis present

## 2022-09-06 DIAGNOSIS — Z79899 Other long term (current) drug therapy: Secondary | ICD-10-CM

## 2022-09-06 DIAGNOSIS — I951 Orthostatic hypotension: Secondary | ICD-10-CM | POA: Diagnosis present

## 2022-09-06 DIAGNOSIS — Z792 Long term (current) use of antibiotics: Secondary | ICD-10-CM | POA: Diagnosis not present

## 2022-09-06 DIAGNOSIS — G0430 Acute necrotizing hemorrhagic encephalopathy, unspecified: Secondary | ICD-10-CM | POA: Diagnosis not present

## 2022-09-06 DIAGNOSIS — Z452 Encounter for adjustment and management of vascular access device: Secondary | ICD-10-CM | POA: Diagnosis not present

## 2022-09-06 DIAGNOSIS — R6521 Severe sepsis with septic shock: Secondary | ICD-10-CM | POA: Diagnosis present

## 2022-09-06 DIAGNOSIS — Z8249 Family history of ischemic heart disease and other diseases of the circulatory system: Secondary | ICD-10-CM

## 2022-09-06 DIAGNOSIS — Z833 Family history of diabetes mellitus: Secondary | ICD-10-CM

## 2022-09-06 DIAGNOSIS — E1169 Type 2 diabetes mellitus with other specified complication: Secondary | ICD-10-CM

## 2022-09-06 LAB — COMPREHENSIVE METABOLIC PANEL
ALT: 21 U/L (ref 0–44)
AST: 33 U/L (ref 15–41)
Albumin: 2.6 g/dL — ABNORMAL LOW (ref 3.5–5.0)
Alkaline Phosphatase: 44 U/L (ref 38–126)
Anion gap: 11 (ref 5–15)
BUN: 29 mg/dL — ABNORMAL HIGH (ref 8–23)
CO2: 20 mmol/L — ABNORMAL LOW (ref 22–32)
Calcium: 7.8 mg/dL — ABNORMAL LOW (ref 8.9–10.3)
Chloride: 110 mmol/L (ref 98–111)
Creatinine, Ser: 2.45 mg/dL — ABNORMAL HIGH (ref 0.61–1.24)
GFR, Estimated: 27 mL/min — ABNORMAL LOW (ref >60)
Glucose, Bld: 112 mg/dL — ABNORMAL HIGH (ref 70–99)
Potassium: 3.6 mmol/L (ref 3.5–5.1)
Sodium: 141 mmol/L (ref 135–145)
Total Bilirubin: 1.7 mg/dL — ABNORMAL HIGH (ref 0.3–1.2)
Total Protein: 5.5 g/dL — ABNORMAL LOW (ref 6.5–8.1)

## 2022-09-06 LAB — CBC WITH DIFFERENTIAL/PLATELET
Abs Immature Granulocytes: 0.3 10*3/uL — ABNORMAL HIGH (ref 0.00–0.07)
Basophils Absolute: 0 10*3/uL (ref 0.0–0.1)
Basophils Relative: 0 %
Eosinophils Absolute: 0 10*3/uL (ref 0.0–0.5)
Eosinophils Relative: 0 %
HCT: 23.5 % — ABNORMAL LOW (ref 39.0–52.0)
Hemoglobin: 7.1 g/dL — ABNORMAL LOW (ref 13.0–17.0)
Immature Granulocytes: 2 %
Lymphocytes Relative: 4 %
Lymphs Abs: 0.5 10*3/uL — ABNORMAL LOW (ref 0.7–4.0)
MCH: 31.4 pg (ref 26.0–34.0)
MCHC: 30.2 g/dL (ref 30.0–36.0)
MCV: 104 fL — ABNORMAL HIGH (ref 80.0–100.0)
Monocytes Absolute: 0.7 10*3/uL (ref 0.1–1.0)
Monocytes Relative: 6 %
Neutro Abs: 10.8 10*3/uL — ABNORMAL HIGH (ref 1.7–7.7)
Neutrophils Relative %: 88 %
Platelets: 162 10*3/uL (ref 150–400)
RBC: 2.26 MIL/uL — ABNORMAL LOW (ref 4.22–5.81)
RDW: 16.8 % — ABNORMAL HIGH (ref 11.5–15.5)
WBC Morphology: INCREASED
WBC: 12.4 10*3/uL — ABNORMAL HIGH (ref 4.0–10.5)
nRBC: 0.2 % (ref 0.0–0.2)

## 2022-09-06 LAB — URINALYSIS, ROUTINE W REFLEX MICROSCOPIC
Bilirubin Urine: NEGATIVE
Glucose, UA: NEGATIVE mg/dL
Ketones, ur: NEGATIVE mg/dL
Nitrite: NEGATIVE
Protein, ur: 100 mg/dL — AB
RBC / HPF: >50 RBC/hpf (ref 0–5)
Specific Gravity, Urine: 1.014 (ref 1.005–1.030)
pH: 7 (ref 5.0–8.0)

## 2022-09-06 LAB — LACTIC ACID, PLASMA
Lactic Acid, Venous: 4.2 mmol/L (ref 0.5–1.9)
Lactic Acid, Venous: 5.1 mmol/L (ref 0.5–1.9)
Lactic Acid, Venous: 3.7 mmol/L (ref 0.5–1.9)

## 2022-09-06 LAB — CBC
HCT: 28.3 % — ABNORMAL LOW (ref 39.0–52.0)
HCT: 28.8 % — ABNORMAL LOW (ref 39.0–52.0)
Hemoglobin: 8.7 g/dL — ABNORMAL LOW (ref 13.0–17.0)
Hemoglobin: 9.1 g/dL — ABNORMAL LOW (ref 13.0–17.0)
MCH: 30.3 pg (ref 26.0–34.0)
MCH: 30.5 pg (ref 26.0–34.0)
MCHC: 30.7 g/dL (ref 30.0–36.0)
MCHC: 31.6 g/dL (ref 30.0–36.0)
MCV: 98.6 fL (ref 80.0–100.0)
MCV: 96.6 fL (ref 80.0–100.0)
Platelets: 168 10*3/uL (ref 150–400)
Platelets: 165 10*3/uL (ref 150–400)
RBC: 2.87 MIL/uL — ABNORMAL LOW (ref 4.22–5.81)
RBC: 2.98 MIL/uL — ABNORMAL LOW (ref 4.22–5.81)
RDW: 19.2 % — ABNORMAL HIGH (ref 11.5–15.5)
RDW: 19.8 % — ABNORMAL HIGH (ref 11.5–15.5)
WBC: 17.8 10*3/uL — ABNORMAL HIGH (ref 4.0–10.5)
WBC: 17.6 10*3/uL — ABNORMAL HIGH (ref 4.0–10.5)
nRBC: 0 % (ref 0.0–0.2)
nRBC: 0 % (ref 0.0–0.2)

## 2022-09-06 LAB — BPAM RBC
Blood Product Expiration Date: 202407182359
ISSUE DATE / TIME: 202407041404

## 2022-09-06 LAB — TYPE AND SCREEN

## 2022-09-06 LAB — APTT: aPTT: 33 seconds (ref 24–36)

## 2022-09-06 LAB — PROTIME-INR
INR: 1.3 — ABNORMAL HIGH (ref 0.8–1.2)
Prothrombin Time: 16.7 seconds — ABNORMAL HIGH (ref 11.4–15.2)

## 2022-09-06 LAB — TROPONIN I (HIGH SENSITIVITY)
Troponin I (High Sensitivity): 11 ng/L (ref <18)
Troponin I (High Sensitivity): 15 ng/L (ref <18)

## 2022-09-06 LAB — PREPARE RBC (CROSSMATCH)

## 2022-09-06 LAB — MAGNESIUM: Magnesium: 1.4 mg/dL — ABNORMAL LOW (ref 1.7–2.4)

## 2022-09-06 LAB — CBG MONITORING, ED
Glucose-Capillary: 130 mg/dL — ABNORMAL HIGH (ref 70–99)
Glucose-Capillary: 126 mg/dL — ABNORMAL HIGH (ref 70–99)

## 2022-09-06 LAB — SARS CORONAVIRUS 2 BY RT PCR: SARS Coronavirus 2 by RT PCR: NEGATIVE

## 2022-09-06 LAB — POC OCCULT BLOOD, ED: Fecal Occult Bld: POSITIVE — AB

## 2022-09-06 MED ORDER — POLYETHYLENE GLYCOL 3350 17 G PO PACK: 17.0000 g | PACK | Freq: Every day | ORAL | Status: DC | PRN

## 2022-09-06 MED ORDER — SODIUM CHLORIDE 0.9 % IV SOLN
2.0000 g | Freq: Once | INTRAVENOUS | Status: AC
  Administered 2022-09-06: 2 g via INTRAVENOUS
  Filled 2022-09-06: qty 12.5

## 2022-09-06 MED ORDER — ROSUVASTATIN CALCIUM 5 MG PO TABS
5.0000 mg | ORAL_TABLET | Freq: Every day | ORAL | Status: DC
  Administered 2022-09-08 – 2022-09-16 (×9): 5 mg via ORAL
  Filled 2022-09-06 (×9): qty 1

## 2022-09-06 MED ORDER — SODIUM CHLORIDE 0.9% FLUSH
3.0000 mL | Freq: Two times a day (BID) | INTRAVENOUS | Status: DC
  Administered 2022-09-06 – 2022-09-17 (×18): 3 mL via INTRAVENOUS

## 2022-09-06 MED ORDER — SODIUM CHLORIDE 0.9% IV SOLUTION: Freq: Once | INTRAVENOUS | Status: DC

## 2022-09-06 MED ORDER — VANCOMYCIN VARIABLE DOSE PER UNSTABLE RENAL FUNCTION (PHARMACIST DOSING): Status: DC

## 2022-09-06 MED ORDER — PYRIDOSTIGMINE BROMIDE 60 MG PO TABS
30.0000 mg | ORAL_TABLET | Freq: Three times a day (TID) | ORAL | Status: DC
  Administered 2022-09-08 – 2022-09-17 (×26): 30 mg via ORAL
  Filled 2022-09-06 (×35): qty 0.5

## 2022-09-06 MED ORDER — ACETAMINOPHEN 325 MG PO TABS
650.0000 mg | ORAL_TABLET | Freq: Four times a day (QID) | ORAL | Status: DC | PRN
  Administered 2022-09-10: 650 mg via ORAL
  Filled 2022-09-06: qty 2

## 2022-09-06 MED ORDER — METRONIDAZOLE 500 MG/100ML IV SOLN
500.0000 mg | Freq: Two times a day (BID) | INTRAVENOUS | Status: DC
  Administered 2022-09-06 – 2022-09-10 (×8): 500 mg via INTRAVENOUS
  Filled 2022-09-06 (×8): qty 100

## 2022-09-06 MED ORDER — LACTATED RINGERS IV BOLUS (SEPSIS)
1000.0000 mL | Freq: Once | INTRAVENOUS | Status: AC
  Administered 2022-09-06: 1000 mL via INTRAVENOUS

## 2022-09-06 MED ORDER — VANCOMYCIN HCL 1500 MG/300ML IV SOLN
1500.0000 mg | Freq: Once | INTRAVENOUS | Status: AC
  Administered 2022-09-06: 1500 mg via INTRAVENOUS
  Filled 2022-09-06: qty 300

## 2022-09-06 MED ORDER — METRONIDAZOLE 500 MG/100ML IV SOLN
500.0000 mg | Freq: Once | INTRAVENOUS | Status: AC
  Administered 2022-09-06: 500 mg via INTRAVENOUS
  Filled 2022-09-06: qty 100

## 2022-09-06 MED ORDER — LACTATED RINGERS IV SOLN: INTRAVENOUS | Status: AC

## 2022-09-06 MED ORDER — ACETAMINOPHEN 650 MG RE SUPP: 650.0000 mg | Freq: Four times a day (QID) | RECTAL | Status: DC | PRN

## 2022-09-06 MED ORDER — MAGNESIUM SULFATE 2 GM/50ML IV SOLN
2.0000 g | Freq: Once | INTRAVENOUS | Status: AC
  Administered 2022-09-06: 2 g via INTRAVENOUS
  Filled 2022-09-06: qty 50

## 2022-09-06 MED ORDER — SODIUM CHLORIDE 0.9 % IV SOLN
2.0000 g | INTRAVENOUS | Status: DC
  Administered 2022-09-07: 2 g via INTRAVENOUS
  Filled 2022-09-06: qty 12.5

## 2022-09-06 MED ORDER — INSULIN ASPART 100 UNIT/ML IJ SOLN
0.0000 [IU] | INTRAMUSCULAR | Status: DC
  Administered 2022-09-06 – 2022-09-08 (×3): 1 [IU] via SUBCUTANEOUS
  Administered 2022-09-08: 2 [IU] via SUBCUTANEOUS
  Administered 2022-09-08: 3 [IU] via SUBCUTANEOUS
  Administered 2022-09-08: 1 [IU] via SUBCUTANEOUS
  Administered 2022-09-09: 5 [IU] via SUBCUTANEOUS
  Administered 2022-09-09: 2 [IU] via SUBCUTANEOUS
  Administered 2022-09-09: 1 [IU] via SUBCUTANEOUS
  Administered 2022-09-09: 3 [IU] via SUBCUTANEOUS
  Administered 2022-09-09: 8 [IU] via SUBCUTANEOUS
  Administered 2022-09-09 – 2022-09-10 (×2): 3 [IU] via SUBCUTANEOUS

## 2022-09-06 NOTE — Progress Notes (Signed)
Pharmacy Antibiotic Note  Harry [name] is a 77 y.o. male admitted on 09/06/2022 from facility as unresponsive, hypotensive, concern for sepsis.  Pharmacy has been consulted for vancomycin and cefepime dosing.  Plan: Vancomycin 1500 mg IV x 1, then variable dosing due to unstable renal function Cefepime 2g IV every 24 hours Monitor renal function, Cx and clinical progression to narrow Vancomycin levels as needed  Height: 5\' 8"  (172.7 cm) Weight: 80.7 kg (178 lb) IBW/kg (Calculated) : 68.4  Temp (24hrs), Avg:97.3 F (36.3 C), Min:97.3 F (36.3 C), Max:97.3 F (36.3 C)  Recent Labs  Lab 09/06/22 1000  WBC 12.4*  CREATININE 2.45*  LATICACIDVEN 4.2*    Estimated Creatinine Clearance: 25.2 mL/min (A) (by C-G formula based on SCr of 2.45 mg/dL (H)).    Allergies  Allergen Reactions   Lipitor [Atorvastatin] Other (See Comments)    Myalgias   Statins Other (See Comments)    Myalgias  Pt tolerating rosuvastatin 5mg  QD   Pravachol [Pravastatin] Rash    Daylene Posey, PharmD, [facility] Clinical Pharmacist ED Pharmacist Phone # [phone] 09/06/2022 12:50 PM
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
## 2022-09-06 NOTE — Progress Notes (Signed)
   09/06/22 1008  Spiritual Encounters  Type of Visit Attempt (pt unavailable)  Care provided to: [facility] partners present during encounter Nurse  Referral source Nurse (RN/NT/LPN)  Reason for visit Routine spiritual support  OnCall Visit No  Spiritual Framework  Presenting Themes Caregiving needs;Other (comment);Community and relationships  Community/Connection Family  Interventions  Spiritual Care Interventions Made Compassionate presence;Reflective listening;Encouragement   Visited patient who was not aware of presence. Waited for family to arrive. Daughter and grandson arrived and was taken to the patient's room. Talked with daughter until nurse left. Left daughter alone to talk with patient as she no longer request chaplain. Returned to check on daughter once patient was taken for CT scan. Daughter and grandson where fine.  Patient was returned and united with daughter and grandson. Chaplain left family alone.

## 2022-09-06 NOTE — ED Provider Notes (Signed)
[place] [facility]GENCY DEPARTMENT AT [facility] Provider Note   CSN: [id number] Arrival date & time: 09/06/22  1000     History  Chief Complaint  Patient presents with   Altered Mental Status    Harry [name] is a 77 y.o. male.  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  EMS was called today because of AMS.  He was unresponsive for staff and bp in the 70s.  EMS started an IV and gave him 1L NS.  He is more alert and back to his baseline which, per EMS report, is grunting vocalizations.  Pt is unable to give any hx.  Pt's daughter is here now.  She said he's been confused for a few days.  She thought he had a UTI but the facility was unable to get a specimen.  She said pt is normally alert to himself and can feed himself.  He can talk.  Grunting vocalizations are not normal.          Home Medications Prior to Admission medications   Medication Sig Start Date End Date Taking? Authorizing Provider  aspirin 81 MG EC tablet Take 1 tablet (81 mg total) by mouth every evening. 10/27/19  Yes [place], Eric J, DO  carvedilol (COREG) 12.5 MG tablet Take 1 tablet (12.5 mg total) by mouth 2 (two) times daily with a meal. 06/07/20  Yes Lewie Chamber, MD  clopidogrel (PLAVIX) 75 MG tablet Take 75 mg by mouth daily.   Yes [provider]  Distilled Water (PURIFIED WATER PO) Take 8-16 fluid ounces by mouth 3 (three) times daily. Please record amount consumed, to promote renal function.   Yes [provider]  docusate sodium (COLACE) 100 MG capsule Take 100 mg by mouth 2 (two) times daily.   Yes [provider]  ferrous gluconate (FERGON) 324 MG tablet Take 324 mg by mouth every evening.   Yes [provider]  insulin glargine (LANTUS SOLOSTAR) 100 UNIT/ML Solostar Pen Inject 13 Units into the skin at  bedtime.   Yes [provider]  insulin lispro (HUMALOG) 100 UNIT/ML injection Inject 0-12 Units into the skin See admin instructions. Inject 0-12 units three times daily before meals per sliding scale: BG < 70, call NP/PA. BG 70 - 200 : 0 units BG 201-250 : 2 units BG 251-300 : 4 units BG 301-350 : 6 units BG 351-400 : 8 units BG 401-450 : 10 units BG 451-500 : 12 units BG > 500 : 12 units, recheck in 2 hours. If still > 350 or < 100 notify provider BG is 451 to 600 give 12 units.   Yes [provider]  losartan (COZAAR) 25 MG tablet Take 12.5 mg by mouth daily as needed (sBP > 155 or dBP > 100).   Yes [provider]  Polyethyl Glycol-Propyl Glycol (GENTEAL TEARS SEVERE DAY/NIGHT) 0.4-0.3 % GEL ophthalmic gel Place 1 Application into both eyes 3 (three) times daily.   Yes [provider]  polyethylene glycol powder (GLYCOLAX/MIRALAX) 17 GM/SCOOP powder Take 17 g by mouth daily in the afternoon.   Yes [provider]  pyridostigmine (MESTINON) 60 MG tablet Take 0.5 tablets (30 mg total) by mouth every 8 (eight) hours. 10/30/19  Yes Kathlen Mody, MD  rosuvastatin (CRESTOR) 5 MG tablet Take 5 mg by mouth at bedtime.   Yes [provider]  sertraline (ZOLOFT) 25 MG tablet Take 25 mg by mouth daily.   Yes [provider]  tamsulosin (FLOMAX) 0.4 MG CAPS capsule Take 1 capsule (0.4 mg total) by mouth daily. 06/06/22  Yes Dorcas Carrow, MD  metFORMIN (GLUCOPHAGE-XR) 500 MG 24 hr tablet Take 500 mg by mouth daily before breakfast. Patient not taking: Reported on 09/06/2022 09/06/22   [provider]      Allergies    Lipitor [atorvastatin], Statins, and Pravachol [pravastatin]    Review of Systems   Review of Systems  Unable to perform ROS: Patient nonverbal    Physical Exam Updated Vital Signs BP 106/60   Pulse 76   Temp (!) 97 F (36.1 C) (Axillary)   Resp (!) 22   Ht 5\' 8"  (1.727 m)   Wt 80.7 kg   SpO2 100%   BMI  27.06 kg/m  Physical Exam Vitals and nursing note reviewed.  Constitutional:      Appearance: He is ill-appearing.  HENT:     Head: Normocephalic and atraumatic.     Right Ear: External ear normal.     Left Ear: External ear normal.     Nose: Nose normal.     Mouth/Throat:     Mouth: Mucous membranes are dry.  Eyes:     Conjunctiva/sclera: Conjunctivae normal.     Comments: Left eye with large cataract  Cardiovascular:     Rate and Rhythm: Normal rate and regular rhythm.     Pulses: Normal pulses.     Heart sounds: Normal heart sounds.  Pulmonary:     Effort: Pulmonary effort is normal.     Breath sounds: Normal breath sounds.  Abdominal:     General: Abdomen is flat. Bowel sounds are normal.     Palpations: Abdomen is soft.  Genitourinary:    Rectum: Guaiac result positive.     Comments: Stool is [place] Musculoskeletal:        General: Normal range of motion.     Cervical back: Normal range of motion and neck supple.  Skin:    General: Skin is warm.     Capillary Refill: Capillary refill takes less than 2 seconds.  Neurological:     Mental Status: He is alert. He is disoriented.     ED Results / Procedures / Treatments   Labs (all labs ordered are listed, but only abnormal results are displayed) Labs Reviewed  LACTIC ACID, PLASMA - Abnormal; Notable for the following components:      Result Value   Lactic Acid, Venous 4.2 (*)    All other components within normal limits  LACTIC ACID, PLASMA - Abnormal; Notable for the following components:   Lactic Acid, Venous 5.1 (*)    All other components within normal limits  COMPREHENSIVE METABOLIC PANEL - Abnormal; Notable for the following components:   CO2 20 (*)    Glucose, Bld 112 (*)    BUN 29 (*)    Creatinine, Ser 2.45 (*)    Calcium 7.8 (*)    Total Protein 5.5 (*)    Albumin 2.6 (*)    Total Bilirubin 1.7 (*)    GFR, Estimated 27 (*)    All other components within normal limits  CBC WITH  DIFFERENTIAL/PLATELET - Abnormal; Notable for the following components:   WBC 12.4 (*)    RBC 2.26 (*)    Hemoglobin 7.1 (*)    HCT 23.5 (*)    MCV 104.0 (*)    RDW 16.8 (*)  Neutro Abs 10.8 (*)    Lymphs Abs 0.5 (*)    Abs Immature Granulocytes 0.30 (*)    All other components within normal limits  PROTIME-INR - Abnormal; Notable for the following components:   Prothrombin Time 16.7 (*)    INR 1.3 (*)    All other components within normal limits  URINALYSIS, ROUTINE W REFLEX MICROSCOPIC - Abnormal; Notable for the following components:   Color, Urine AMBER (*)    APPearance CLOUDY (*)    Hgb urine dipstick MODERATE (*)    Protein, ur 100 (*)    Leukocytes,Ua TRACE (*)    Bacteria, UA MANY (*)    All other components within normal limits  MAGNESIUM - Abnormal; Notable for the following components:   Magnesium 1.4 (*)    All other components within normal limits  POC OCCULT BLOOD, ED - Abnormal; Notable for the following components:   Fecal Occult Bld POSITIVE (*)    All other components within normal limits  SARS CORONAVIRUS 2 BY RT PCR  RESP PANEL BY RT-PCR (RSV, FLU A&B, COVID)  [id number]  CULTURE, BLOOD (ROUTINE X 2)  CULTURE, BLOOD (ROUTINE X 2)  URINE CULTURE  APTT  CBC  CBC  CBC  LACTIC ACID, PLASMA  TYPE AND SCREEN  PREPARE RBC (CROSSMATCH)  TROPONIN I (HIGH SENSITIVITY)  TROPONIN I (HIGH SENSITIVITY)    EKG None  Radiology CT CHEST ABDOMEN PELVIS WO CONTRAST  Result Date: 09/06/2022 CLINICAL DATA:  Sepsis EXAM: CT CHEST, ABDOMEN AND PELVIS WITHOUT CONTRAST TECHNIQUE: Multidetector CT imaging of the chest, abdomen and pelvis was performed following the standard protocol without IV contrast. RADIATION DOSE REDUCTION: This exam was performed according to the departmental dose-optimization program which includes automated exposure control, adjustment of the mA and/or kV according to patient size and/or use of iterative reconstruction technique. COMPARISON:  CT  chest 06/02/2022 and CT AP from 06/30/2016 FINDINGS: CT CHEST FINDINGS Cardiovascular: Normal heart size. Aortic atherosclerosis and coronary artery calcifications. No pericardial effusion. Mediastinum/Nodes: Thyroid gland, trachea, and esophagus are normal. No mediastinal or axillary adenopathy. Hilar lymph nodes are suboptimally evaluated due to lack of IV contrast. Lungs/Pleura: No pleural effusion. Bilateral lower lobe bronchial wall thickening with ground-glass attenuation and thickening of the peribronchovascular interstitium. There is a peripheral nodule within the right upper lobe which appears subpleural measuring 1 cm, image 62/4. Previously this measured 6 mm. Musculoskeletal: No chest wall mass or suspicious bone lesions identified. CT ABDOMEN PELVIS FINDINGS Hepatobiliary: No suspicious liver abnormality. Too small to characterize hypodensity along the dome of left hepatic lobe measures 5 mm. Calcified gallstones within the dependent portion of the gallbladder measure up to 1 cm. No gallbladder wall thickening or pericholecystic inflammation. Pancreas: Unremarkable. No pancreatic ductal dilatation or surrounding inflammatory changes. Spleen: Normal in size without focal abnormality. Adrenals/Urinary Tract: Normal adrenal glands. 1 cm right kidney cyst is identified, image 65/2. No follow-up imaging recommended. No signs of nephrolithiasis or hydronephrosis. Thick-walled urinary bladder is decompressed around a Foley catheter balloon. Stomach/Bowel: Stomach appears normal. No pathologic dilatation of the large or small bowel loops to suggest obstruction. The appendix is visualized and appears normal. Mild stool burden noted throughout the colon and rectum. Vascular/Lymphatic: Aortic atherosclerosis without aneurysm. No signs of abdominopelvic adenopathy. Reproductive: Prostate gland enlargement. Other: No ascites or focal fluid collections. No signs of pneumoperitoneum. Musculoskeletal: No acute or  significant osseous findings. IMPRESSION: 1. Bilateral lower lobe bronchial wall thickening with ground-glass attenuation and thickening of the peribronchovascular interstitium.  Findings are concerning for bronchopneumonia. 2. 1 cm right upper lobe pulmonary nodule, increased in size from 6 mm on 06/02/2022. Recommend referral to pulmonary medicine or thoracic surgery for further management. 3. Cholelithiasis without evidence of acute cholecystitis. 4. Prostate gland enlargement and thick-walled urinary bladder which is decompressed from a Foley catheter. 5. Aortic Atherosclerosis (ICD10-I70.0). Electronically Signed   By: Signa Kell M.D.   On: 09/06/2022 14:02   CT Head Wo Contrast  Result Date: 09/06/2022 CLINICAL DATA:  Mental status change EXAM: CT HEAD WITHOUT CONTRAST TECHNIQUE: Contiguous axial images were obtained from the base of the skull through the vertex without intravenous contrast. RADIATION DOSE REDUCTION: This exam was performed according to the departmental dose-optimization program which includes automated exposure control, adjustment of the mA and/or kV according to patient size and/or use of iterative reconstruction technique. COMPARISON:  06/03/2020 FINDINGS: Brain: No evidence of acute infarction, hemorrhage, extra-axial collection, ventriculomegaly, or mass effect. Generalized cerebral atrophy. Periventricular white matter low attenuation likely secondary to microangiopathy. Vascular: Cerebrovascular atherosclerotic calcifications are noted. Skull: Negative for fracture or focal lesion. Sinuses/Orbits: Visualized portions of the orbits are unremarkable. Visualized portions of the paranasal sinuses are unremarkable. Visualized portions of the mastoid air cells are unremarkable. Other: None. IMPRESSION: 1. No acute intracranial findings. 2. Chronic small vessel ischemic changes. Electronically Signed   By: Elige Ko M.D.   On: 09/06/2022 11:20   DG Chest Port 1 View  Result Date:  09/06/2022 CLINICAL DATA:  Sepsis EXAM: PORTABLE CHEST 1 VIEW COMPARISON:  06/01/2022 FINDINGS: No focal consolidation. No pleural effusion or pneumothorax. Heart and mediastinal contours are unremarkable. No acute osseous abnormality. IMPRESSION: No active disease. Electronically Signed   By: Elige Ko M.D.   On: 09/06/2022 10:53    Procedures Procedures    Medications Ordered in ED Medications  lactated ringers infusion ( Intravenous New Bag/Given 09/06/22 1220)  ceFEPIme (MAXIPIME) 2 g in sodium chloride 0.9 % 100 mL IVPB (has no administration in time range)  vancomycin variable dose per unstable renal function (pharmacist dosing) (has no administration in time range)  0.9 %  sodium chloride infusion (Manually program via Guardrails IV Fluids) ( Intravenous Not Given 09/06/22 1353)  metroNIDAZOLE (FLAGYL) IVPB 500 mg (has no administration in time range)  rosuvastatin (CRESTOR) tablet 5 mg (has no administration in time range)  pyridostigmine (MESTINON) tablet 30 mg (has no administration in time range)  sodium chloride flush (NS) 0.9 % injection 3 mL (3 mLs Intravenous Not Given 09/06/22 1353)  acetaminophen (TYLENOL) tablet 650 mg (has no administration in time range)    Or  acetaminophen (TYLENOL) suppository 650 mg (has no administration in time range)  polyethylene glycol (MIRALAX / GLYCOLAX) packet 17 g (has no administration in time range)  insulin aspart (novoLOG) injection 0-9 Units (has no administration in time range)  lactated ringers bolus 1,000 mL (0 mLs Intravenous Stopped 09/06/22 1133)    And  lactated ringers bolus 1,000 mL (0 mLs Intravenous Stopped 09/06/22 1216)    And  lactated ringers bolus 1,000 mL (0 mLs Intravenous Stopped 09/06/22 1133)  metroNIDAZOLE (FLAGYL) IVPB 500 mg (0 mg Intravenous Stopped 09/06/22 1216)  ceFEPIme (MAXIPIME) 2 g in sodium chloride 0.9 % 100 mL IVPB (0 g Intravenous Stopped 09/06/22 1133)  vancomycin (VANCOREADY) IVPB 1500 mg/300 mL (0 mg  Intravenous Stopped 09/06/22 1436)  magnesium sulfate IVPB 2 g 50 mL (0 g Intravenous Stopped 09/06/22 1216)    ED Course/ Medical Decision Making/ A&P  Medical Decision Making Amount and/or Complexity of Data Reviewed Labs: ordered. Radiology: ordered. ECG/medicine tests: ordered.  Risk Prescription drug management. Decision regarding hospitalization.   This patient presents to the ED for concern of ams, this involves an extensive number of treatment options, and is a complaint that carries with it a high risk of complications and morbidity.  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  The pertinent results include:  cbc with wbc elevated at 12.4, hgb 7.1 (hgb 9.4 in April); cmp with bun 29 and cr 2.45 (bun 11 and cr 1.12 in April); mg 1.4, INR 1.3, UA >50 rbcs and protein, many bacteria; lactic is 4.2 and up to 5.1   Imaging Studies ordered:  I ordered imaging studies including cxr, ct head  I independently visualized and interpreted imaging which showed  CXR: No active disease.  CT head: No acute intracranial findings.  2. Chronic small vessel ischemic changes.  CT chest/abd/pelvis: Bilateral lower lobe bronchial wall thickening with ground-glass  attenuation and thickening of the peribronchovascular interstitium.  Findings are concerning for bronchopneumonia.  2. 1 cm right upper lobe pulmonary nodule, increased in size from 6  mm on 06/02/2022. Recommend referral to pulmonary  medicine or  thoracic surgery for further management.  3. Cholelithiasis without evidence of acute cholecystitis.  4. Prostate gland enlargement and thick-walled urinary bladder which  is decompressed from a Foley catheter.  5. Aortic Atherosclerosis (ICD10-I70.0).   I agree with the radiologist interpretation   Cardiac Monitoring:  The patient was maintained on a cardiac monitor.  I personally viewed and interpreted the cardiac monitored which showed an underlying rhythm of: nsr   Medicines ordered and prescription drug management:  I ordered medication including ivfs/abx  for sx  Reevaluation of the patient after these medicines showed that the patient improved I have reviewed the patients home medicines and have made adjustments as needed   Test Considered:  ct   Critical Interventions:  Abx, ivfs   Consultations Obtained:  I requested consultation with Dr. Alinda Money,  and discussed lab and imaging findings as well as pertinent plan -he will admit Pt d/w GI who did see pt in consult.  At this point, they will monitor and consider intervention if clinical status improves.     Problem List / ED Course:  AKI: likely due to dehydration.  Fluids given Sepsis:  code sepsis called.  Pt given abx for unkn source and 30 cc/kg bolus Anemia with + gi bleed:  no frank bleeding.  GI consulted.  1 unit prbc given for transfusion ? Uti:  foley placed here as we needed to monitor I/o's and needed to get a clean urine.   CT with bronchopneumonia:  pt given abx.   Reevaluation:  After the interventions noted above, I reevaluated the patient and found that they have :improved   Social Determinants of Health:  Lives in snf   Dispostion:  After consideration of the diagnostic results and the patients response to treatment, I feel that the patent would benefit from admission.  CRITICAL CARE Performed by: Jacalyn Lefevre   Total critical  care time: 30 minutes  Critical care  time was exclusive of separately billable procedures and treating other patients.  Critical care was necessary to treat or prevent imminent or life-threatening deterioration.  Critical care was time spent personally by me on the following activities: development of treatment plan with patient and/or surrogate as well as nursing, discussions with consultants, evaluation of patient's response to treatment, examination of patient, obtaining history from patient or surrogate, ordering and performing treatments and interventions, ordering and review of laboratory studies, ordering and review of radiographic studies, pulse oximetry and re-evaluation of patient's condition.           Final Clinical Impression(s) / ED Diagnoses Final diagnoses:  AKI (acute kidney injury) (HCC)  Dehydration  Acute cystitis with hematuria  Anemia, unspecified type  Community acquired pneumonia, unspecified laterality  Sepsis with encephalopathy without septic shock, due to unspecified organism [facility])    Rx / DC Orders ED Discharge Orders     None         Jacalyn Lefevre, MD 09/06/22 1544

## 2022-09-06 NOTE — Progress Notes (Signed)
[facility]link following for sepsis protocol. 
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
## 2022-09-06 NOTE — ED Triage Notes (Signed)
Pt BIB [facility] from [facility] as an unresponsive.  Pt was found to be unresponsive with SBP in 70s during morning rounds.  En route EMS gave 1L of fluid.  BP on arrival was 97/50.  CBG 156. Hx of HTN,

## 2022-09-07 DIAGNOSIS — N179 Acute kidney failure, unspecified: Secondary | ICD-10-CM | POA: Diagnosis not present

## 2022-09-07 DIAGNOSIS — K8020 Calculus of gallbladder without cholecystitis without obstruction: Secondary | ICD-10-CM | POA: Diagnosis not present

## 2022-09-07 DIAGNOSIS — J180 Bronchopneumonia, unspecified organism: Secondary | ICD-10-CM

## 2022-09-07 DIAGNOSIS — G9340 Encephalopathy, unspecified: Secondary | ICD-10-CM | POA: Diagnosis not present

## 2022-09-07 DIAGNOSIS — D649 Anemia, unspecified: Secondary | ICD-10-CM

## 2022-09-07 DIAGNOSIS — Z515 Encounter for palliative care: Secondary | ICD-10-CM

## 2022-09-07 DIAGNOSIS — R195 Other fecal abnormalities: Secondary | ICD-10-CM | POA: Diagnosis not present

## 2022-09-07 DIAGNOSIS — G0430 Acute necrotizing hemorrhagic encephalopathy, unspecified: Secondary | ICD-10-CM | POA: Diagnosis not present

## 2022-09-07 DIAGNOSIS — K922 Gastrointestinal hemorrhage, unspecified: Secondary | ICD-10-CM | POA: Diagnosis not present

## 2022-09-07 DIAGNOSIS — Z7902 Long term (current) use of antithrombotics/antiplatelets: Secondary | ICD-10-CM

## 2022-09-07 LAB — TYPE AND SCREEN
ABO/RH(D): B POS
Antibody Screen: NEGATIVE
Unit division: 0

## 2022-09-07 LAB — BLOOD CULTURE ID PANEL (REFLEXED) - BCID2

## 2022-09-07 LAB — CBC
HCT: 28.5 % — ABNORMAL LOW (ref 39.0–52.0)
HCT: 28.6 % — ABNORMAL LOW (ref 39.0–52.0)
Hemoglobin: 9 g/dL — ABNORMAL LOW (ref 13.0–17.0)
Hemoglobin: 8.9 g/dL — ABNORMAL LOW (ref 13.0–17.0)
MCH: 31.1 pg (ref 26.0–34.0)
MCH: 29.8 pg (ref 26.0–34.0)
MCHC: 31.6 g/dL (ref 30.0–36.0)
MCHC: 31.1 g/dL (ref 30.0–36.0)
MCV: 98.6 fL (ref 80.0–100.0)
MCV: 95.7 fL (ref 80.0–100.0)
Platelets: 163 10*3/uL (ref 150–400)
Platelets: 159 10*3/uL (ref 150–400)
RBC: 2.89 MIL/uL — ABNORMAL LOW (ref 4.22–5.81)
RBC: 2.99 MIL/uL — ABNORMAL LOW (ref 4.22–5.81)
RDW: 20.3 % — ABNORMAL HIGH (ref 11.5–15.5)
RDW: 20.2 % — ABNORMAL HIGH (ref 11.5–15.5)
WBC: 19.8 10*3/uL — ABNORMAL HIGH (ref 4.0–10.5)
WBC: 19.5 10*3/uL — ABNORMAL HIGH (ref 4.0–10.5)
nRBC: 0 % (ref 0.0–0.2)
nRBC: 0 % (ref 0.0–0.2)

## 2022-09-07 LAB — BPAM RBC: Unit Type and Rh: 7300

## 2022-09-07 LAB — COMPREHENSIVE METABOLIC PANEL
ALT: 33 U/L (ref 0–44)
AST: 41 U/L (ref 15–41)
Albumin: 2.5 g/dL — ABNORMAL LOW (ref 3.5–5.0)
Alkaline Phosphatase: 55 U/L (ref 38–126)
Anion gap: 10 (ref 5–15)
BUN: 33 mg/dL — ABNORMAL HIGH (ref 8–23)
CO2: 20 mmol/L — ABNORMAL LOW (ref 22–32)
Calcium: 7.5 mg/dL — ABNORMAL LOW (ref 8.9–10.3)
Chloride: 110 mmol/L (ref 98–111)
Creatinine, Ser: 1.98 mg/dL — ABNORMAL HIGH (ref 0.61–1.24)
GFR, Estimated: 35 mL/min — ABNORMAL LOW (ref >60)
Glucose, Bld: 143 mg/dL — ABNORMAL HIGH (ref 70–99)
Potassium: 3.9 mmol/L (ref 3.5–5.1)
Sodium: 140 mmol/L (ref 135–145)
Total Bilirubin: 1.7 mg/dL — ABNORMAL HIGH (ref 0.3–1.2)
Total Protein: 5.6 g/dL — ABNORMAL LOW (ref 6.5–8.1)

## 2022-09-07 LAB — GLUCOSE, CAPILLARY
Glucose-Capillary: 101 mg/dL — ABNORMAL HIGH (ref 70–99)
Glucose-Capillary: 103 mg/dL — ABNORMAL HIGH (ref 70–99)
Glucose-Capillary: 105 mg/dL — ABNORMAL HIGH (ref 70–99)
Glucose-Capillary: 107 mg/dL — ABNORMAL HIGH (ref 70–99)
Glucose-Capillary: 150 mg/dL — ABNORMAL HIGH (ref 70–99)
Glucose-Capillary: 76 mg/dL (ref 70–99)
Glucose-Capillary: 95 mg/dL (ref 70–99)

## 2022-09-07 LAB — IRON AND TIBC
Iron: 7 ug/dL — ABNORMAL LOW (ref 45–182)
Saturation Ratios: 4 % — ABNORMAL LOW (ref 17.9–39.5)
TIBC: 200 ug/dL — ABNORMAL LOW (ref 250–450)
UIBC: 193 ug/dL

## 2022-09-07 LAB — URINE CULTURE

## 2022-09-07 LAB — FERRITIN: Ferritin: 179 ng/mL (ref 24–336)

## 2022-09-07 LAB — LACTIC ACID, PLASMA
Lactic Acid, Venous: 1.6 mmol/L (ref 0.5–1.9)
Lactic Acid, Venous: 1.5 mmol/L (ref 0.5–1.9)

## 2022-09-07 LAB — AMMONIA: Ammonia: 18 umol/L (ref 9–35)

## 2022-09-07 LAB — FOLATE: Folate: 5.6 ng/mL — ABNORMAL LOW (ref >5.9)

## 2022-09-07 LAB — CULTURE, BLOOD (ROUTINE X 2): Culture: NO GROWTH

## 2022-09-07 LAB — PROCALCITONIN: Procalcitonin: 101.5 ng/mL

## 2022-09-07 LAB — MAGNESIUM: Magnesium: 1.8 mg/dL (ref 1.7–2.4)

## 2022-09-07 LAB — VITAMIN B12: Vitamin B-12: 628 pg/mL (ref 180–914)

## 2022-09-07 MED ORDER — VANCOMYCIN HCL 750 MG/150ML IV SOLN
750.0000 mg | INTRAVENOUS | Status: DC
  Administered 2022-09-07: 750 mg via INTRAVENOUS
  Filled 2022-09-07 (×2): qty 150

## 2022-09-07 MED ORDER — CHLORHEXIDINE GLUCONATE CLOTH 2 % EX PADS
6.0000 | MEDICATED_PAD | Freq: Every day | CUTANEOUS | Status: DC
  Administered 2022-09-07 – 2022-09-12 (×6): 6 via TOPICAL

## 2022-09-07 MED ORDER — DEXTROSE-SODIUM CHLORIDE 5-0.9 % IV SOLN: INTRAVENOUS | Status: DC

## 2022-09-07 MED ORDER — SERTRALINE HCL 50 MG PO TABS
25.0000 mg | ORAL_TABLET | Freq: Every day | ORAL | Status: DC
  Administered 2022-09-09 – 2022-09-17 (×9): 25 mg via ORAL
  Filled 2022-09-07 (×9): qty 1

## 2022-09-07 MED ORDER — TAMSULOSIN HCL 0.4 MG PO CAPS
0.4000 mg | ORAL_CAPSULE | Freq: Every day | ORAL | Status: DC
  Administered 2022-09-09 – 2022-09-17 (×9): 0.4 mg via ORAL
  Filled 2022-09-07 (×9): qty 1

## 2022-09-07 MED ORDER — SODIUM CHLORIDE 0.9 % IV SOLN: INTRAVENOUS | Status: DC

## 2022-09-07 MED ORDER — PANTOPRAZOLE SODIUM 40 MG IV SOLR
40.0000 mg | Freq: Two times a day (BID) | INTRAVENOUS | Status: DC
  Administered 2022-09-07 – 2022-09-11 (×9): 40 mg via INTRAVENOUS
  Filled 2022-09-07 (×9): qty 10

## 2022-09-07 MED ORDER — SODIUM CHLORIDE 0.9 % IV SOLN
1.0000 mg | Freq: Every day | INTRAVENOUS | Status: DC
  Administered 2022-09-07 – 2022-09-10 (×4): 1 mg via INTRAVENOUS
  Filled 2022-09-07 (×4): qty 0.2

## 2022-09-07 NOTE — Progress Notes (Signed)
Daily Progress Note  DOA: 09/06/2022 Hospital Day: 2 Chief Complaint: Anemia, FOBT+   Assessment and Plan:    Harry [name] is a 77 y.o. year old male whose past medical history of lewy body dementia, CKD 3, HTN, PVD s/p stent placement, DM2 who was brought to ED 7/4 with hypotension and altered mental status. Undergoing workup for sepsis, possible UTI. Found to have worsening of chronic anemia .   Worsening of chronic anemia with macrocytosis and Heme + stool on Plavix and asa. Hgb 7.1, down from baseline to 10-11.  PUD? AVMs? Intestinal neoplasm? Nutritional deficiency possibly but doesn't explain heme + stool  -Staff cleaning him, was incontinent of large amount of unformed stool. Still with altered mental status.   -Greater than expected rise in hgb from 7.1 >> 9.1 after only 1 unit of blood yesterday. I expect hgb will drift some with equilibration if the 7.1 was true reading. -Await iron studies, B12, folate -Daughter wants endoscopic evaluation of anemia once patient is medically stable. Fortunately we have time since he isn't overtly bleeding. Probably will pursue EGD when acute medical issues improved. Would be difficult to prep for a colonoscopy. Daughter describes orthostatic hypotension putting him at risk for falls during a bowel prep.   Acute encephalopathy / sepsis. -No acute findings on head CT scan  -Blood culture negative at 24 hours. Urine culture reincubated -CT head without acute abnormalities.  -CT chest ,abdomen,  pelvis wo contrast concerning for bronchopneumonia.  Cholelithiasis without evidence for acute cholecystitis on non-contrast CT scan   RUL pulmonary nodule, increased in size from late March.  -Evaluation per admission team.    Possible bronchopneumonia.   See PMH for additional medical problems.      Subjective / New Events:   Daughter hasn't yet arrived.   Objective:   Recent Labs    09/06/22 1940 09/07/22 0131 09/07/22 0737   WBC 17.6* 19.8* 19.5*  HGB 9.1* 9.0* 8.9*  HCT 28.8* 28.5* 28.6*  PLT 165 163 159   BMET Recent Labs    09/06/22 1000 09/07/22 0131  NA 141 140  K 3.6 3.9  CL 110 110  CO2 20* 20*  GLUCOSE 112* 143*  BUN 29* 33*  CREATININE 2.45* 1.98*  CALCIUM 7.8* 7.5*   LFT Recent Labs    09/07/22 0131  PROT 5.6*  ALBUMIN 2.5*  AST 41  ALT 33  ALKPHOS 55  BILITOT 1.7*   PT/INR Recent Labs    09/06/22 1000  LABPROT 16.7*  INR 1.3*     Imaging:  CT CHEST ABDOMEN PELVIS WO CONTRAST CLINICAL DATA:  Sepsis  EXAM: CT CHEST, ABDOMEN AND PELVIS WITHOUT CONTRAST  TECHNIQUE: Multidetector CT imaging of the chest, abdomen and pelvis was performed following the standard protocol without IV contrast.  RADIATION DOSE REDUCTION: This exam was performed according to the departmental dose-optimization program which includes automated exposure control, adjustment of the mA and/or kV according to patient size and/or use of iterative reconstruction technique.  COMPARISON:  CT chest 06/02/2022 and CT AP from 06/30/2016  FINDINGS: CT CHEST FINDINGS  Cardiovascular: Normal heart size. Aortic atherosclerosis and coronary artery calcifications. No pericardial effusion.  Mediastinum/Nodes: Thyroid gland, trachea, and esophagus are normal. No mediastinal or axillary adenopathy. Hilar lymph nodes are suboptimally evaluated due to lack of IV contrast.  Lungs/Pleura: No pleural effusion. Bilateral lower lobe bronchial wall thickening with ground-glass attenuation and thickening of the peribronchovascular interstitium. There is a peripheral nodule within  the right upper lobe which appears subpleural measuring 1 cm, image 62/4. Previously this measured 6 mm.  Musculoskeletal: No chest wall mass or suspicious bone lesions identified.  CT ABDOMEN PELVIS FINDINGS  Hepatobiliary: No suspicious liver abnormality. Too small to characterize hypodensity along the dome of left hepatic  lobe measures 5 mm. Calcified gallstones within the dependent portion of the gallbladder measure up to 1 cm. No gallbladder wall thickening or pericholecystic inflammation.  Pancreas: Unremarkable. No pancreatic ductal dilatation or surrounding inflammatory changes.  Spleen: Normal in size without focal abnormality.  Adrenals/Urinary Tract: Normal adrenal glands. 1 cm right kidney cyst is identified, image 65/2. No follow-up imaging recommended. No signs of nephrolithiasis or hydronephrosis. Thick-walled urinary bladder is decompressed around a Foley catheter balloon.  Stomach/Bowel: Stomach appears normal. No pathologic dilatation of the large or small bowel loops to suggest obstruction. The appendix is visualized and appears normal. Mild stool burden noted throughout the colon and rectum.  Vascular/Lymphatic: Aortic atherosclerosis without aneurysm. No signs of abdominopelvic adenopathy.  Reproductive: Prostate gland enlargement.  Other: No ascites or focal fluid collections. No signs of pneumoperitoneum.  Musculoskeletal: No acute or significant osseous findings.  IMPRESSION: 1. Bilateral lower lobe bronchial wall thickening with ground-glass attenuation and thickening of the peribronchovascular interstitium. Findings are concerning for bronchopneumonia. 2. 1 cm right upper lobe pulmonary nodule, increased in size from 6 mm on 06/02/2022. Recommend referral to pulmonary medicine or thoracic surgery for further management. 3. Cholelithiasis without evidence of acute cholecystitis. 4. Prostate gland enlargement and thick-walled urinary bladder which is decompressed from a Foley catheter. 5. Aortic Atherosclerosis (ICD10-I70.0).  Electronically Signed   By: Signa Kell M.D.   On: 09/06/2022 14:02 CT Head Wo Contrast CLINICAL DATA:  Mental status change  EXAM: CT HEAD WITHOUT CONTRAST  TECHNIQUE: Contiguous axial images were obtained from the base of the  skull through the vertex without intravenous contrast.  RADIATION DOSE REDUCTION: This exam was performed according to the departmental dose-optimization program which includes automated exposure control, adjustment of the mA and/or kV according to patient size and/or use of iterative reconstruction technique.  COMPARISON:  06/03/2020  FINDINGS: Brain: No evidence of acute infarction, hemorrhage, extra-axial collection, ventriculomegaly, or mass effect. Generalized cerebral atrophy. Periventricular white matter low attenuation likely secondary to microangiopathy.  Vascular: Cerebrovascular atherosclerotic calcifications are noted.  Skull: Negative for fracture or focal lesion.  Sinuses/Orbits: Visualized portions of the orbits are unremarkable. Visualized portions of the paranasal sinuses are unremarkable. Visualized portions of the mastoid air cells are unremarkable.  Other: None.  IMPRESSION: 1. No acute intracranial findings. 2. Chronic small vessel ischemic changes.  Electronically Signed   By: Elige Ko M.D.   On: 09/06/2022 11:20 DG Chest Port 1 View CLINICAL DATA:  Sepsis  EXAM: PORTABLE CHEST 1 VIEW  COMPARISON:  06/01/2022  FINDINGS: No focal consolidation. No pleural effusion or pneumothorax. Heart and mediastinal contours are unremarkable.  No acute osseous abnormality.  IMPRESSION: No active disease.  Electronically Signed   By: Elige Ko M.D.   On: 09/06/2022 10:53     Scheduled inpatient medications:   sodium chloride   Intravenous Once   insulin aspart  0-9 Units Subcutaneous Q4H   pyridostigmine  30 mg Oral Q8H   rosuvastatin  5 mg Oral QHS   sodium chloride flush  3 mL Intravenous Q12H   vancomycin variable dose per unstable renal function (pharmacist dosing)   Does not apply See admin instructions   Continuous inpatient infusions:  ceFEPime (MAXIPIME) IV     metronidazole 500 mg (09/07/22 0844)   PRN inpatient medications:  acetaminophen **OR** acetaminophen, polyethylene glycol  Vital signs in last 24 hours: Temp:  [97 F (36.1 C)-99.4 F (37.4 C)] 98 F (36.7 C) (07/05 0801) Pulse Rate:  [68-88] 78 (07/05 0801) Resp:  [6-29] 15 (07/05 0801) BP: (92-134)/(52-94) 134/65 (07/05 0801) SpO2:  [95 %-100 %] 98 % (07/05 0801) Weight:  [79.7 kg-80.7 kg] 79.7 kg (07/04 2226)    Intake/Output Summary (Last 24 hours) at 09/07/2022 1052 Last data filed at 09/07/2022 0844 Gross per 24 hour  Intake 5767.8 ml  Output 1000 ml  Net 4767.8 ml    Intake/Output from previous day: 07/04 0701 - 07/05 0700 In: 5507.8 [I.V.:2482.4; Blood:500; IV Piggyback:2525.4] Out: 1000 [Urine:1000] Intake/Output this shift: Total I/O In: 260 [I.V.:260] Out: -    Physical Exam:  General: Alert male in NAD.  Heart:  Regular rate and rhythm.  Pulmonary: Normal respiratory effort Abdomen: Soft, nondistended, nontender. Normal bowel sounds. Extremities: No lower extremity edema  Neurologic: Alert , doesn't follow commands or answer my questions.    Principal Problem:   Acute encephalopathy Active Problems:   Type 2 diabetes mellitus with stage 3 chronic kidney disease, with long-term current use of insulin (HCC)   Essential hypertension   PAD (peripheral artery disease) (HCC)   S/P peripheral artery angioplasty   Acute renal failure superimposed on stage 3a chronic kidney disease (HCC)   Lewy body dementia (HCC)   History of CVA (cerebrovascular accident)   Sepsis (HCC)   Normocytic anemia   Hyperlipidemia   Dementia without behavioral disturbance (HCC)   GI bleed   Heme positive stool   Acute on chronic anemia     LOS: 1 day   Willette Cluster ,NP 09/07/2022, 10:52 AM

## 2022-09-07 NOTE — Progress Notes (Signed)
   Palliative Medicine Inpatient Follow Up Note   The PMT has acknowledged the consultation for Harry [name]. He was last seen by PMT 06/02/22-06/04/22.   Patients daughter, [name]  called this late morning to set up an in person meeting. [name] was very clear that she does not desire Palliative Care involvement for her father. She shares she wants full scope of all medical care.   I shared we would sign off.  The primary MD, Dr. Blake Divine made aware of the above.  No Charge ______________________________________________________________________________________ Lamarr Lulas  Palliative Medicine Team Team Cell Phone: [phone] Please utilize secure chat with additional questions, if there is no response within 30 minutes please call the above phone number  Palliative Medicine Team providers are available by phone from 7am to 7pm daily and can be reached through the team cell phone.  Should this patient require assistance outside of these hours, please call the patient's attending physician.

## 2022-09-07 NOTE — Progress Notes (Signed)
PHARMACY - PHYSICIAN COMMUNICATION CRITICAL VALUE ALERT - BLOOD CULTURE IDENTIFICATION (BCID)  Harry [name] is an 77 y.o. male who presented to [facility] on 09/06/2022 with a chief complaint of sepsis  Assessment:  1/4 blood culture bottles growing GPC - nothing on BCID  Name of physician (or Provider) Contacted: Dr. Julian Reil  Current antibiotics: Cefepime, Flagyl, and Vancomycin  Changes to prescribed antibiotics recommended:  No additional abx needed  Results for orders placed or performed during the hospital encounter of 09/06/22  Blood Culture ID Panel (Reflexed) (Collected: 09/06/2022 10:01 AM)  Result Value Ref Range   Enterococcus faecalis NOT DETECTED NOT DETECTED   Enterococcus Faecium NOT DETECTED NOT DETECTED   Listeria monocytogenes NOT DETECTED NOT DETECTED   Staphylococcus species NOT DETECTED NOT DETECTED   Staphylococcus aureus (BCID) NOT DETECTED NOT DETECTED   Staphylococcus epidermidis NOT DETECTED NOT DETECTED   Staphylococcus lugdunensis NOT DETECTED NOT DETECTED   Streptococcus species NOT DETECTED NOT DETECTED   Streptococcus agalactiae NOT DETECTED NOT DETECTED   Streptococcus pneumoniae NOT DETECTED NOT DETECTED   Streptococcus pyogenes NOT DETECTED NOT DETECTED   A.calcoaceticus-baumannii NOT DETECTED NOT DETECTED   Bacteroides fragilis NOT DETECTED NOT DETECTED   Enterobacterales NOT DETECTED NOT DETECTED   Enterobacter cloacae complex NOT DETECTED NOT DETECTED   Escherichia coli NOT DETECTED NOT DETECTED   Klebsiella aerogenes NOT DETECTED NOT DETECTED   Klebsiella oxytoca NOT DETECTED NOT DETECTED   Klebsiella pneumoniae NOT DETECTED NOT DETECTED   Proteus species NOT DETECTED NOT DETECTED   Salmonella species NOT DETECTED NOT DETECTED   Serratia marcescens NOT DETECTED NOT DETECTED   Haemophilus influenzae NOT DETECTED NOT DETECTED   Neisseria meningitidis NOT DETECTED NOT DETECTED   Pseudomonas aeruginosa NOT DETECTED NOT DETECTED    Stenotrophomonas maltophilia NOT DETECTED NOT DETECTED   Candida albicans NOT DETECTED NOT DETECTED   Candida auris NOT DETECTED NOT DETECTED   Candida glabrata NOT DETECTED NOT DETECTED   Candida krusei NOT DETECTED NOT DETECTED   Candida parapsilosis NOT DETECTED NOT DETECTED   Candida tropicalis NOT DETECTED NOT DETECTED   Cryptococcus neoformans/gattii NOT DETECTED NOT DETECTED    Christoper Fabian, PharmD, BCPS Please see amion for complete clinical pharmacist phone list 09/07/2022  9:08 PM

## 2022-09-07 NOTE — Plan of Care (Signed)
  Problem: Metabolic: Goal: Ability to maintain appropriate glucose levels will improve Outcome: Progressing   

## 2022-09-07 NOTE — Progress Notes (Addendum)
Pharmacy Antibiotic Note  Harry [name] is a 77 y.o. male admitted on 09/06/2022 from facility as unresponsive, hypotensive, concern for sepsis.  Pharmacy has been consulted for vancomycin and cefepime dosing.  Renal fxn improving, Scr 1.98 (baseline 1.2-1.5). WBC trending up (19.5). Afebrile. CT abdomen concerning for bronchopneumonia.   Blood cx NGTD, urine cx pending.   Plan: Vancomycin 750 mg IV q24h (eAUC 431.6, anticipate needing a dose increase d/t improving renal fxn) Monitor renal function, Cx and clinical progression to narrow  Cefepime 2g IV q12h Metronidazole 500 mg IV q12h  Vancomycin levels as needed  Height: 5\' 8"  (172.7 cm) Weight: 79.7 kg (175 lb 11.3 oz) IBW/kg (Calculated) : 68.4  Temp (24hrs), Avg:98.2 F (36.8 C), Min:97 F (36.1 C), Max:99.4 F (37.4 C)  Recent Labs  Lab 09/06/22 1000 09/06/22 1208 09/06/22 1755 09/06/22 1940 09/07/22 0131 09/07/22 0737  WBC 12.4*  --  17.8* 17.6* 19.8* 19.5*  CREATININE 2.45*  --   --   --  1.98*  --   LATICACIDVEN 4.2* 5.1* 3.7*  --   --   --     Estimated Creatinine Clearance: 31.2 mL/min (A) (by C-G formula based on SCr of 1.98 mg/dL (H)).    Allergies  Allergen Reactions   Lipitor [Atorvastatin] Other (See Comments)    Myalgias   Statins Other (See Comments)    Myalgias  Pt tolerating rosuvastatin 5mg  QD   Pravachol [Pravastatin] Rash    Thank you for involving pharmacy in the patient's care.   Theotis Burrow, PharmD PGY1 Acute Care Pharmacy Resident  09/07/2022 9:45 AM

## 2022-09-07 NOTE — Progress Notes (Signed)
[facility]                                                                               Harry [name], is a 77 y.o. male, DOB - [birth year], ZOX:[id number] Admit date - 09/06/2022    Outpatient Primary MD for the patient is System, Provider Not In  LOS - 1  days    Brief summary   Harry [name] is a 77 y.o. male with medical history significant of Lewy body dementia, diabetes type 2, CKD 3A, anemia, hypertension, hyperlipidemia, syncope, peripheral arterial disease status post angioplasty and stenting, depression, glaucoma presenting with altered mental status. As per the daughter, pt at baseline is able to feed by himself and talk.     Assessment & Plan    Assessment and Plan:   Sepsis from bronchopneumonia:  Pt hypotensive, with  leukocytosis, tachypnea, elevated lactic acid on admission.  He was empirically started on broad spectrum IV antibiotics.  Follow up blood and urine cultures and narrow the antibiotics.  He is currently on IV vancomycin, IV cefepime and IV flagyl.  Trend lactic acid.  Check pro calcitonin.    Acute encephalopathy  from bronchopneumonia.  Initial CT head does not show any acute pathology.  MRI brain ordered for further evaluation.  Ammonia. TSH ordered and pending.  SLP evaluation ordered. Patient still  lethargic, but opening eyes to verbal cues but not following commands.      Acute anemia of unclear etiology. Possible GI bleed.  Patient is on aspirin and plavix which have been held on admission.  Baseline hemoglobin appears to be around 10, admitted with hemoglobin of 7.1 s/p 1 unit of prbc transfusion and hemoglobin greater than 9.  GI consulted and recommendations given.  Anemia panel shows both iron and folate deficiencies.  Replacements ordered.  Vit b12 levels are adequate.    Acute on stage 3 a CKD:  Baseline creatinine is around 1.5, admitted with a creatinine of 2. 45,  Gently hydrate and repeat renal parameters  in am.    Insulin dependent DM;  Continue with SSI.    H/o CVA with hemiplegia:  Was on aspirin, plavix and statin.  DAPT on hold for anemia.    Hypertension;  Episodes of hypotension resolved on admission.  Currently BP parameters are optimal.    H/O PAD On DAPT and statin , which were on hold due to FOBT positive and anemia.  Continue with statin for now.       Estimated body mass index is 26.72 kg/m as calculated from the following:   Height as of this encounter: 5\' 8"  (1.727 m).   Weight as of this encounter: 79.7 kg.  Code Status: full code.  DVT Prophylaxis:  SCDs Start: 09/06/22 1341   Level of Care: Level of care: Progressive Family Communication: none at bedside.   Disposition Plan:     Remains inpatient appropriate:  IV antibiotics, anemia work up.  AMS.   Procedures:  None.   Consultants:   Gastroenterology.   Antimicrobials:   Anti-infectives (From admission, onward)    Start     Dose/Rate Route Frequency Ordered Stop   09/07/22 1400  ceFEPIme (MAXIPIME) 2 g in sodium chloride 0.9 % 100 mL IVPB        2 g 200 mL/hr over 30 Minutes Intravenous Every 24 hours 09/06/22 1250     09/07/22 1230  vancomycin (VANCOREADY) IVPB 750 mg/150 mL        750 mg 150 mL/hr over 60 Minutes Intravenous Every 24 hours 09/07/22 1139     09/06/22 2200  metroNIDAZOLE (FLAGYL) IVPB 500 mg        500 mg 100 mL/hr over 60 Minutes Intravenous Every 12 hours 09/06/22 1344     09/06/22 1250  vancomycin variable dose per unstable renal function (pharmacist dosing)  Status:  Discontinued         Does not apply See admin instructions 09/06/22 1250 09/07/22 1159   09/06/22 1015  metroNIDAZOLE (FLAGYL) IVPB 500 mg        500 mg 100 mL/hr over 60 Minutes Intravenous  Once 09/06/22 1002 09/06/22 1216   09/06/22 1015  ceFEPIme (MAXIPIME) 2 g in sodium chloride 0.9 % 100 mL IVPB        2 g 200 mL/hr over 30 Minutes Intravenous  Once 09/06/22 1010 09/06/22 1133   09/06/22 1015   vancomycin (VANCOREADY) IVPB 1500 mg/300 mL        1,500 mg 150 mL/hr over 120 Minutes Intravenous  Once 09/06/22 1010 09/06/22 1436        Medications  Scheduled Meds:  insulin aspart  0-9 Units Subcutaneous Q4H   pantoprazole (PROTONIX) IV  40 mg Intravenous Q12H   pyridostigmine  30 mg Oral Q8H   rosuvastatin  5 mg Oral QHS   sodium chloride flush  3 mL Intravenous Q12H   Continuous Infusions:  sodium chloride 75 mL/hr at 09/07/22 1247   ceFEPime (MAXIPIME) IV 2 g (09/07/22 1538)   metronidazole 500 mg (09/07/22 0844)   vancomycin 750 mg (09/07/22 1356)   PRN Meds:.acetaminophen **OR** acetaminophen, polyethylene glycol    Subjective:   [name] was seen and examined today. Pt alert, still non verbal. Opening eyes with verbal cues, appears comfortable.   Objective:   Vitals:   09/07/22 0801 09/07/22 1127 09/07/22 1200 09/07/22 1542  BP: 134/65 134/63 (!) 131/59 131/61  Pulse: 78 79 79 80  Resp: 15 17 18 20   Temp: 98 F (36.7 C) 98 F (36.7 C)  99.1 F (37.3 C)  TempSrc: Oral Oral  Oral  SpO2: 98% 98% 97% 97%  Weight:      Height:        Intake/Output Summary (Last 24 hours) at 09/07/2022 1703 Last data filed at 09/07/2022 1542 Gross per 24 hour  Intake 2917.8 ml  Output 1200 ml  Net 1717.8 ml   Filed Weights   09/06/22 1127 09/06/22 2226  Weight: 80.7 kg 79.7 kg     Exam General exam: Ill appearing elderly gentleman, not in distress.  Respiratory system: Clear to auscultation. Respiratory effort normal. Cardiovascular system: S1 & S2 heard, RRR. No JVD, Gastrointestinal system: Abdomen is nondistended, soft and nontender.  Central nervous system: Alert , opens eyes to verbal cues. Not following commands.  Extremities: no cyanosis.  Skin: No rashes,  Psychiatry: unable to assess.     Data Reviewed:  I have personally reviewed following labs and imaging studies   CBC Lab Results  Component Value Date   WBC 19.5 (H) 09/07/2022   RBC  2.99 (L) 09/07/2022   HGB 8.9 (L) 09/07/2022   HCT 28.6 (L) 09/07/2022  MCV 95.7 09/07/2022   MCH 29.8 09/07/2022   PLT 159 09/07/2022   MCHC 31.1 09/07/2022   RDW 20.2 (H) 09/07/2022   LYMPHSABS 0.5 (L) 09/06/2022   MONOABS 0.7 09/06/2022   EOSABS 0.0 09/06/2022   BASOSABS 0.0 09/06/2022     Last metabolic panel Lab Results  Component Value Date   NA 140 09/07/2022   K 3.9 09/07/2022   CL 110 09/07/2022   CO2 20 (L) 09/07/2022   BUN 33 (H) 09/07/2022   CREATININE 1.98 (H) 09/07/2022   GLUCOSE 143 (H) 09/07/2022   GFRNONAA 35 (L) 09/07/2022   GFRAA 51 (L) 10/30/2019   CALCIUM 7.5 (L) 09/07/2022   PHOS 2.9 06/05/2022   PROT 5.6 (L) 09/07/2022   ALBUMIN 2.5 (L) 09/07/2022   LABGLOB 3.2 11/11/2015   AGRATIO 1.3 11/11/2015   BILITOT 1.7 (H) 09/07/2022   ALKPHOS 55 09/07/2022   AST 41 09/07/2022   ALT 33 09/07/2022   ANIONGAP 10 09/07/2022    CBG (last 3)  Recent Labs    09/07/22 0759 09/07/22 1126 09/07/22 1538  GLUCAP 103* 105* 95      Coagulation Profile: Recent Labs  Lab 09/06/22 1000  INR 1.3*     Radiology Studies: CT CHEST ABDOMEN PELVIS WO CONTRAST  Result Date: 09/06/2022 CLINICAL DATA:  Sepsis EXAM: CT CHEST, ABDOMEN AND PELVIS WITHOUT CONTRAST TECHNIQUE: Multidetector CT imaging of the chest, abdomen and pelvis was performed following the standard protocol without IV contrast. RADIATION DOSE REDUCTION: This exam was performed according to the departmental dose-optimization program which includes automated exposure control, adjustment of the mA and/or kV according to patient size and/or use of iterative reconstruction technique. COMPARISON:  CT chest 06/02/2022 and CT AP from 06/30/2016 FINDINGS: CT CHEST FINDINGS Cardiovascular: Normal heart size. Aortic atherosclerosis and coronary artery calcifications. No pericardial effusion. Mediastinum/Nodes: Thyroid gland, trachea, and esophagus are normal. No mediastinal or axillary adenopathy. Hilar lymph  nodes are suboptimally evaluated due to lack of IV contrast. Lungs/Pleura: No pleural effusion. Bilateral lower lobe bronchial wall thickening with ground-glass attenuation and thickening of the peribronchovascular interstitium. There is a peripheral nodule within the right upper lobe which appears subpleural measuring 1 cm, image 62/4. Previously this measured 6 mm. Musculoskeletal: No chest wall mass or suspicious bone lesions identified. CT ABDOMEN PELVIS FINDINGS Hepatobiliary: No suspicious liver abnormality. Too small to characterize hypodensity along the dome of left hepatic lobe measures 5 mm. Calcified gallstones within the dependent portion of the gallbladder measure up to 1 cm. No gallbladder wall thickening or pericholecystic inflammation. Pancreas: Unremarkable. No pancreatic ductal dilatation or surrounding inflammatory changes. Spleen: Normal in size without focal abnormality. Adrenals/Urinary Tract: Normal adrenal glands. 1 cm right kidney cyst is identified, image 65/2. No follow-up imaging recommended. No signs of nephrolithiasis or hydronephrosis. Thick-walled urinary bladder is decompressed around a Foley catheter balloon. Stomach/Bowel: Stomach appears normal. No pathologic dilatation of the large or small bowel loops to suggest obstruction. The appendix is visualized and appears normal. Mild stool burden noted throughout the colon and rectum. Vascular/Lymphatic: Aortic atherosclerosis without aneurysm. No signs of abdominopelvic adenopathy. Reproductive: Prostate gland enlargement. Other: No ascites or focal fluid collections. No signs of pneumoperitoneum. Musculoskeletal: No acute or significant osseous findings. IMPRESSION: 1. Bilateral lower lobe bronchial wall thickening with ground-glass attenuation and thickening of the peribronchovascular interstitium. Findings are concerning for bronchopneumonia. 2. 1 cm right upper lobe pulmonary nodule, increased in size from 6 mm on 06/02/2022.  Recommend referral to pulmonary medicine or thoracic  surgery for further management. 3. Cholelithiasis without evidence of acute cholecystitis. 4. Prostate gland enlargement and thick-walled urinary bladder which is decompressed from a Foley catheter. 5. Aortic Atherosclerosis (ICD10-I70.0). Electronically Signed   By: Signa Kell M.D.   On: 09/06/2022 14:02   CT Head Wo Contrast  Result Date: 09/06/2022 CLINICAL DATA:  Mental status change EXAM: CT HEAD WITHOUT CONTRAST TECHNIQUE: Contiguous axial images were obtained from the base of the skull through the vertex without intravenous contrast. RADIATION DOSE REDUCTION: This exam was performed according to the departmental dose-optimization program which includes automated exposure control, adjustment of the mA and/or kV according to patient size and/or use of iterative reconstruction technique. COMPARISON:  06/03/2020 FINDINGS: Brain: No evidence of acute infarction, hemorrhage, extra-axial collection, ventriculomegaly, or mass effect. Generalized cerebral atrophy. Periventricular white matter low attenuation likely secondary to microangiopathy. Vascular: Cerebrovascular atherosclerotic calcifications are noted. Skull: Negative for fracture or focal lesion. Sinuses/Orbits: Visualized portions of the orbits are unremarkable. Visualized portions of the paranasal sinuses are unremarkable. Visualized portions of the mastoid air cells are unremarkable. Other: None. IMPRESSION: 1. No acute intracranial findings. 2. Chronic small vessel ischemic changes. Electronically Signed   By: Elige Ko M.D.   On: 09/06/2022 11:20   DG Chest Port 1 View  Result Date: 09/06/2022 CLINICAL DATA:  Sepsis EXAM: PORTABLE CHEST 1 VIEW COMPARISON:  06/01/2022 FINDINGS: No focal consolidation. No pleural effusion or pneumothorax. Heart and mediastinal contours are unremarkable. No acute osseous abnormality. IMPRESSION: No active disease. Electronically Signed   By: Elige Ko  M.D.   On: 09/06/2022 10:53       Kathlen Mody M.D. [facility] 09/07/2022, 5:03 PM  Available via Epic secure chat 7am-7pm After 7 pm, please refer to night coverage provider listed on amion.

## 2022-09-08 ENCOUNTER — Inpatient Hospital Stay (HOSPITAL_COMMUNITY): Payer: Medicare Other

## 2022-09-08 DIAGNOSIS — K8020 Calculus of gallbladder without cholecystitis without obstruction: Secondary | ICD-10-CM | POA: Diagnosis not present

## 2022-09-08 DIAGNOSIS — G9340 Encephalopathy, unspecified: Secondary | ICD-10-CM | POA: Diagnosis not present

## 2022-09-08 DIAGNOSIS — G0430 Acute necrotizing hemorrhagic encephalopathy, unspecified: Secondary | ICD-10-CM | POA: Diagnosis not present

## 2022-09-08 DIAGNOSIS — R195 Other fecal abnormalities: Secondary | ICD-10-CM | POA: Diagnosis not present

## 2022-09-08 DIAGNOSIS — D649 Anemia, unspecified: Secondary | ICD-10-CM | POA: Diagnosis not present

## 2022-09-08 LAB — CBC WITH DIFFERENTIAL/PLATELET
Abs Immature Granulocytes: 0.19 10*3/uL — ABNORMAL HIGH (ref 0.00–0.07)
Abs Immature Granulocytes: 0 10*3/uL (ref 0.00–0.07)
Basophils Absolute: 0.1 10*3/uL (ref 0.0–0.1)
Basophils Absolute: 0 10*3/uL (ref 0.0–0.1)
Basophils Relative: 0 %
Basophils Relative: 0 %
Eosinophils Absolute: 0.2 10*3/uL (ref 0.0–0.5)
Eosinophils Absolute: 0.2 10*3/uL (ref 0.0–0.5)
Eosinophils Relative: 1 %
Eosinophils Relative: 1 %
HCT: 28.3 % — ABNORMAL LOW (ref 39.0–52.0)
HCT: 26.8 % — ABNORMAL LOW (ref 39.0–52.0)
Hemoglobin: 8.8 g/dL — ABNORMAL LOW (ref 13.0–17.0)
Hemoglobin: 8.5 g/dL — ABNORMAL LOW (ref 13.0–17.0)
Immature Granulocytes: 1 %
Lymphocytes Relative: 7 %
Lymphocytes Relative: 8 %
Lymphs Abs: 1.2 10*3/uL (ref 0.7–4.0)
Lymphs Abs: 1.3 10*3/uL (ref 0.7–4.0)
MCH: 30.2 pg (ref 26.0–34.0)
MCH: 30.2 pg (ref 26.0–34.0)
MCHC: 31.1 g/dL (ref 30.0–36.0)
MCHC: 31.7 g/dL (ref 30.0–36.0)
MCV: 97.3 fL (ref 80.0–100.0)
MCV: 95.4 fL (ref 80.0–100.0)
Monocytes Absolute: 1.1 10*3/uL — ABNORMAL HIGH (ref 0.1–1.0)
Monocytes Absolute: 0.6 10*3/uL (ref 0.1–1.0)
Monocytes Relative: 6 %
Monocytes Relative: 4 %
Neutro Abs: 15.5 10*3/uL — ABNORMAL HIGH (ref 1.7–7.7)
Neutro Abs: 14 10*3/uL — ABNORMAL HIGH (ref 1.7–7.7)
Neutrophils Relative %: 85 %
Neutrophils Relative %: 87 %
Platelets: 150 10*3/uL (ref 150–400)
Platelets: 155 10*3/uL (ref 150–400)
RBC: 2.91 MIL/uL — ABNORMAL LOW (ref 4.22–5.81)
RBC: 2.81 MIL/uL — ABNORMAL LOW (ref 4.22–5.81)
RDW: 19.4 % — ABNORMAL HIGH (ref 11.5–15.5)
RDW: 18.9 % — ABNORMAL HIGH (ref 11.5–15.5)
WBC: 18.2 10*3/uL — ABNORMAL HIGH (ref 4.0–10.5)
WBC: 16.1 10*3/uL — ABNORMAL HIGH (ref 4.0–10.5)
nRBC: 0 % (ref 0.0–0.2)
nRBC: 0 % (ref 0.0–0.2)
nRBC: 0 /100 WBC

## 2022-09-08 LAB — COMPREHENSIVE METABOLIC PANEL
ALT: 30 U/L (ref 0–44)
AST: 48 U/L — ABNORMAL HIGH (ref 15–41)
Albumin: 2.3 g/dL — ABNORMAL LOW (ref 3.5–5.0)
Alkaline Phosphatase: 66 U/L (ref 38–126)
Anion gap: 14 (ref 5–15)
BUN: 28 mg/dL — ABNORMAL HIGH (ref 8–23)
CO2: 21 mmol/L — ABNORMAL LOW (ref 22–32)
Calcium: 7.6 mg/dL — ABNORMAL LOW (ref 8.9–10.3)
Chloride: 109 mmol/L (ref 98–111)
Creatinine, Ser: 1 mg/dL (ref 0.61–1.24)
GFR, Estimated: >60 mL/min (ref >60)
Glucose, Bld: 160 mg/dL — ABNORMAL HIGH (ref 70–99)
Potassium: 3.3 mmol/L — ABNORMAL LOW (ref 3.5–5.1)
Sodium: 144 mmol/L (ref 135–145)
Total Bilirubin: 1.2 mg/dL (ref 0.3–1.2)
Total Protein: 5.3 g/dL — ABNORMAL LOW (ref 6.5–8.1)

## 2022-09-08 LAB — CULTURE, BLOOD (ROUTINE X 2): Special Requests: ADEQUATE

## 2022-09-08 LAB — GLUCOSE, CAPILLARY
Glucose-Capillary: 106 mg/dL — ABNORMAL HIGH (ref 70–99)
Glucose-Capillary: 128 mg/dL — ABNORMAL HIGH (ref 70–99)
Glucose-Capillary: 137 mg/dL — ABNORMAL HIGH (ref 70–99)
Glucose-Capillary: 145 mg/dL — ABNORMAL HIGH (ref 70–99)
Glucose-Capillary: 168 mg/dL — ABNORMAL HIGH (ref 70–99)
Glucose-Capillary: 247 mg/dL — ABNORMAL HIGH (ref 70–99)

## 2022-09-08 LAB — TSH: TSH: 2.752 u[IU]/mL (ref 0.350–4.500)

## 2022-09-08 MED ORDER — SODIUM CHLORIDE 0.9 % IV SOLN
2.0000 g | Freq: Two times a day (BID) | INTRAVENOUS | Status: DC
  Administered 2022-09-08 – 2022-09-10 (×4): 2 g via INTRAVENOUS
  Filled 2022-09-08 (×4): qty 12.5

## 2022-09-08 MED ORDER — VANCOMYCIN HCL 1500 MG/300ML IV SOLN
1500.0000 mg | INTRAVENOUS | Status: DC
  Administered 2022-09-08 – 2022-09-09 (×2): 1500 mg via INTRAVENOUS
  Filled 2022-09-08 (×3): qty 300

## 2022-09-08 NOTE — Progress Notes (Signed)
Daily Progress Note  DOA: 09/06/2022 Hospital Day: 3 Chief Complaint: anemia, FOBT+   Assessment and Plan:    Brief Narrative:  Harry [name] is a 77 y.o. year old male whose past medical history of lewy body dementia, CKD 3, HTN, PVD s/p stent placement, DM2 who was brought to ED 7/4 with hypotension and altered mental status. Undergoing workup for sepsis, possible UTI. Found to have worsening of chronic anemia .   Worsening of chronic anemia with macrocytosis and Heme + stool on Plavix and asa. Hgb 7.1, down from baseline of 10-11.  PUD? AVMs? Intestinal neoplasm? Nutritional deficiency possibly but doesn't explain heme + stool  - Hgb stable at 8.8 post 1 u PRBCs - No evidence for iron deficiency.  - B12 normal, folate slightly low.  - Patient is awake today. He is sitting up in bed visiting with his daughter. He has some confusion, cannot really tell me where he lives. Daughter was visiting him in room yesterday and noticed that he had a dark BM. We discussed endoscopic evaluation. She agrees that it would probably be difficult for him to complete a bowel prep but EGD is reasonable once medically optimized.     Low folate. Folate slightly low at 5.6.    Sepsis / acute encephalopathy / bronchopneumonia.  -Blood culture negative at 2 days  -Urine culture suggests contamination  -CT head and brain MRI without acute abnormalities.   Cholelithiasis. No evidence for acute cholecystitis on non-contrast CT scan    RUL pulmonary nodule, increased in size from late March.  -Evaluation per admission team.    Possible bronchopneumonia.    Subjective / New Events:   No complaints today. Apparently didn't pass swallowing evaluation but daughter thinks he may not have been fully awake at the time   Objective:   Recent Labs    09/07/22 0131 09/07/22 0737 09/08/22 0715  WBC 19.8* 19.5* 18.2*  HGB 9.0* 8.9* 8.8*  HCT 28.5* 28.6* 28.3*  PLT 163 159 150   BMET Recent  Labs    09/06/22 1000 09/07/22 0131 09/08/22 0715  NA 141 140 144  K 3.6 3.9 3.3*  CL 110 110 109  CO2 20* 20* 21*  GLUCOSE 112* 143* 160*  BUN 29* 33* 28*  CREATININE 2.45* 1.98* 1.00  CALCIUM 7.8* 7.5* 7.6*   LFT Recent Labs    09/08/22 0715  PROT 5.3*  ALBUMIN 2.3*  AST 48*  ALT 30  ALKPHOS 66  BILITOT 1.2   PT/INR Recent Labs    09/06/22 1000  LABPROT 16.7*  INR 1.3*     Imaging:  MR BRAIN WO CONTRAST CLINICAL DATA:  Altered mental status  EXAM: MRI HEAD WITHOUT CONTRAST  TECHNIQUE: Multiplanar, multiecho pulse sequences of the brain and surrounding structures were obtained without intravenous contrast.  COMPARISON:  None Available.  FINDINGS: Brain: No acute infarct, mass effect or extra-axial collection. Chronic microhemorrhages in the cerebellum and brainstem. There is confluent hyperintense T2-weighted signal within the white matter. Generalized volume loss. Old bilateral cerebellar and occipital lobe infarcts. The midline structures are normal.  Vascular: Major flow voids are preserved.  Skull and upper cervical spine: Normal calvarium and skull base. Visualized upper cervical spine and soft tissues are normal.  Sinuses/Orbits:No paranasal sinus fluid levels or advanced mucosal thickening. No mastoid or middle ear effusion. There are bilateral lens replacements.  IMPRESSION: 1. No acute intracranial abnormality. 2. Old bilateral cerebellar and occipital lobe infarcts. 3. Findings of  chronic small vessel ischemia and volume loss.  Electronically Signed   By: Deatra Robinson M.D.   On: 09/08/2022 02:02     Scheduled inpatient medications:   Chlorhexidine Gluconate Cloth  6 each Topical Daily   insulin aspart  0-9 Units Subcutaneous Q4H   pantoprazole (PROTONIX) IV  40 mg Intravenous Q12H   pyridostigmine  30 mg Oral Q8H   rosuvastatin  5 mg Oral QHS   sertraline  25 mg Oral Daily   sodium chloride flush  3 mL Intravenous Q12H    tamsulosin  0.4 mg Oral Daily   Continuous inpatient infusions:   ceFEPime (MAXIPIME) IV 2 g (09/07/22 1538)   dextrose 5 % and 0.9 % NaCl 75 mL/hr at 09/07/22 2035   folic acid 1 mg in sodium chloride 0.9 % 50 mL IVPB 1 mg (09/08/22 1037)   metronidazole 500 mg (09/08/22 0854)   vancomycin     PRN inpatient medications: acetaminophen **OR** acetaminophen, polyethylene glycol  Vital signs in last 24 hours: Temp:  [97.8 F (36.6 C)-99.1 F (37.3 C)] 97.8 F (36.6 C) (07/06 1200) Pulse Rate:  [78-86] 78 (07/06 1200) Resp:  [14-20] 14 (07/06 1200) BP: (131-174)/(61-96) 147/77 (07/06 1200) SpO2:  [96 %-99 %] 96 % (07/06 1200) Weight:  [79.9 kg] 79.9 kg (07/06 0500) Last BM Date : 09/07/22  Intake/Output Summary (Last 24 hours) at 09/08/2022 1223 Last data filed at 09/08/2022 0844 Gross per 24 hour  Intake 884.22 ml  Output 2052 ml  Net -1167.78 ml    Intake/Output from previous day: 07/05 0701 - 07/06 0700 In: 1244.2 [I.V.:844.2; IV Piggyback:400] Out: 1551 [Urine:1551] Intake/Output this shift: Total I/O In: -  Out: 501 [Urine:500; Stool:1]   Physical Exam:  General: Alert male in NAD Heart:  Regular rate and rhythm.  Pulmonary: Normal respiratory effort Abdomen: Soft, nondistended, nontender. Normal bowel sounds. Psych: Pleasant. Cooperative.   Principal Problem:   Acute encephalopathy Active Problems:   Type 2 diabetes mellitus with stage 3 chronic kidney disease, with long-term current use of insulin (HCC)   Essential hypertension   PAD (peripheral artery disease) (HCC)   S/P peripheral artery angioplasty   Acute renal failure superimposed on stage 3a chronic kidney disease (HCC)   Lewy body dementia (HCC)   History of CVA (cerebrovascular accident)   Sepsis (HCC)   Normocytic anemia   Hyperlipidemia   Dementia without behavioral disturbance (HCC)   GI bleed   Heme positive stool   Acute on chronic anemia   Antiplatelet or antithrombotic long-term use    Anemia     LOS: 2 days   Willette Cluster ,NP 09/08/2022, 12:23 PM

## 2022-09-08 NOTE — Evaluation (Signed)
Physical Therapy Evaluation Patient Details Name: Harry [name] MRN: [id number] DOB: 10/19/1945 Today's Date: 09/08/2022  History of Present Illness  Pt is a 77 y.o. male presenting 09/06/22 with AMS. MRI showed no acute intracranial abnormality. Admitted with sepsis and acute encephalopathy from bronchopneumonia. PMH includes: HTN, HLD, DM II, CVA, PVD, CKD IIIA, syncope, glaucoma, and dementia.   Clinical Impression  Pt presents with condition above and deficits mentioned below, see PT Problem List. Pt is a poor historian and no family present to provide PLOF information, but his chart reports he is a long-term care resident at a SNF and relies on staff for ADLs and to assist with transfers to/from a w/c. Currently, pt displays deficits in balance, activity tolerance, generalized strength, and bil lower extremity ROM. He is at high risk for falls, needing total assist for bed mobility and attempts at transfers this date. Pt only able to minimally clear his buttocks with attempts to stand this date. Pt would benefit from rehab at his SNF. Will continue to follow acutely.        Assistance Recommended at Discharge Frequent or constant Supervision/Assistance  If plan is discharge home, recommend the following:  Can travel by private vehicle  Two people to help with walking and/or transfers;Two people to help with bathing/dressing/bathroom;Assistance with cooking/housework;Assistance with feeding;Direct supervision/assist for financial management;Direct supervision/assist for medications management;Assist for transportation;Help with stairs or ramp for entrance   No    Equipment Recommendations Other (comment) (defer to next venue of care)  Recommendations for Other Services  OT consult    Functional Status Assessment Patient has had a recent decline in their functional status and/or demonstrates limited ability to make significant improvements in function in a reasonable and predictable  amount of time     Precautions / Restrictions Precautions Precautions: Fall Restrictions Weight Bearing Restrictions: No      Mobility  Bed Mobility Overal bed mobility: Needs Assistance Bed Mobility: Supine to Sit, Sit to Supine     Supine to sit: Total assist, HOB elevated Sit to supine: Total assist, HOB elevated   General bed mobility comments: Poor initiation by pt to move legs to R EOB, needing total assist to do so and ascend trunk while scooting hips to EOB using the bed pad. Total assist to manage his trunk and legs back to supine.    Transfers Overall transfer level: Needs assistance Equipment used: Rolling walker (2 wheels), 1 person hand held assist Transfers: Sit to/from Stand, Bed to chair/wheelchair/BSC Sit to Stand: Total assist, From elevated surface          Lateral/Scoot Transfers: Total assist General transfer comment: Attempted multiple times for pt to stand from elevated EOB to RW but no initiation or success, thus transitioned to face-to-face approach with pt being cued to pull up on therapist while his bil knees were blocked, only clearing his buttocks minimally from the bed surface and not able to stand all the way up. Laterally scooted 1x to R towards [facility] with total assist and use of bed pad.    Ambulation/Gait               General Gait Details: unable  Stairs            Wheelchair Mobility     Tilt Bed    Modified Rankin (Stroke Patients Only) Modified Rankin (Stroke Patients Only) Pre-Morbid Rankin Score: Severe disability Modified Rankin: Severe disability     Balance Overall balance assessment: Needs assistance Sitting-balance support:  Single extremity supported, Bilateral upper extremity supported, Feet supported Sitting balance-Leahy Scale: Poor Sitting balance - Comments: Pt tends to lean to the R and posteriorly, needing minA for static sitting balance Postural control: Posterior lean, Right lateral lean      Standing balance comment: unable to stand                             Pertinent Vitals/Pain Pain Assessment Pain Assessment: Faces Faces Pain Scale: Hurts a little bit Pain Location: generalized with mobility Pain Descriptors / Indicators: Discomfort, Grimacing Pain Intervention(s): Monitored during session, Limited activity within patient's tolerance, Repositioned    Home Living Family/patient expects to be discharged to:: Skilled nursing facility                   Additional Comments: Per chart, pt is in long term care at [facility].    Prior Function Prior Level of Function : Needs assist             Mobility Comments: Per chart, staff provided assist for transfers to/from w/c, unsure of technique though ADLs Comments: Per chart, pt can feed self but staff assists with other ADLs     Hand Dominance        Extremity/Trunk Assessment   Upper Extremity Assessment Upper Extremity Assessment: Defer to OT evaluation    Lower Extremity Assessment Lower Extremity Assessment: Generalized weakness;RLE deficits/detail;LLE deficits/detail RLE Deficits / Details: MMT scores grossly 2+ to 3-; noted tightness in his hamstrings, hip adductors, and gastrocsoleus LLE Deficits / Details: MMT scores grossly 2+ to 3-; noted tightness in his hamstrings, hip adductors, and gastrocsoleus       Communication   Communication: HOH  Cognition Arousal/Alertness: Awake/alert Behavior During Therapy: Flat affect Overall Cognitive Status: History of cognitive impairments - at baseline                                 General Comments: Hx of dementia, unsure of baseline but pt is currently not oriented to date, location, or situation. No recall of location several minutes later. Poor initiation for all tasks and inconsistent in following simple cues.        General Comments General comments (skin integrity, edema, etc.): VSS on RA    Exercises Other  Exercises Other Exercises: PROM to bil legs into hip abduction, knee extension, ankle dorsiflexion supine in bed   Assessment/Plan    PT Assessment Patient needs continued PT services  PT Problem List Decreased strength;Decreased balance;Decreased activity tolerance;Decreased range of motion;Decreased mobility;Decreased cognition;Decreased coordination       PT Treatment Interventions DME instruction;Functional mobility training;Therapeutic activities;Therapeutic exercise;Balance training;Neuromuscular re-education;Cognitive remediation;Patient/family education;Wheelchair mobility training    PT Goals (Current goals can be found in the Care Plan section)  Acute Rehab PT Goals Patient Stated Goal: did not state PT Goal Formulation: With patient Time For Goal Achievement: 09/22/22 Potential to Achieve Goals: Fair    Frequency Min 1X/week     Co-evaluation               AM-PAC PT "6 Clicks" Mobility  Outcome Measure Help needed turning from your back to your side while in a flat bed without using bedrails?: Total Help needed moving from lying on your back to sitting on the side of a flat bed without using bedrails?: Total Help needed moving to and from a bed  to a chair (including a wheelchair)?: Total Help needed standing up from a chair using your arms (e.g., wheelchair or bedside chair)?: Total Help needed to walk in hospital room?: Total Help needed climbing 3-5 steps with a railing? : Total 6 Click Score: 6    End of Session Equipment Utilized During Treatment: Gait belt Activity Tolerance: Patient tolerated treatment well Patient left: in bed;with call bell/phone within reach;with bed alarm set   PT Visit Diagnosis: Unsteadiness on feet (R26.81);Muscle weakness (generalized) (M62.81);Difficulty in walking, not elsewhere classified (R26.2)    Time: 1610-9604 PT Time Calculation (min) (ACUTE ONLY): 16 min   Charges:   PT Evaluation $PT Eval Moderate Complexity: 1  Mod   PT General Charges $$ ACUTE PT VISIT: 1 Visit         Raymond Gurney, PT, DPT Acute Rehabilitation Services  Office: [phone]   Jewel Baize 09/08/2022, 1:27 PM
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
## 2022-09-08 NOTE — Progress Notes (Signed)
Speech Language Pathology Treatment: Dysphagia  Patient Details Name: Harry [name] MRN: [id number] DOB: [birth year] Today's Date: 09/08/2022 Time: 1435-1500 SLP Time Calculation (min) (ACUTE ONLY): 25 min  Assessment / Plan / Recommendation Clinical Impression  Pt seen for f/u dysphagia tx at request of daughter d/t NPO status.  Pt alert/awake and able to answer simple questions, follow simple commands during tx session which was a significant improvement from previous BSE completion.  Daughter in attendance and A with pt directives and provided education re: s/sx of aspiration seen during po trial.  Pt demonstrated a cumulative delayed swallow with all consistencies and an immediate cough with larger sips of thin, delayed cough with thin via tsp and immediate throat clearing with nectar-thick via spoon.  Honey-thickened liquids provided increased sensory awareness/increased speed of swallow when given via tsp without overt s/sx of aspiration noted. Impaired mastication efforts noted with solids, so recommended to daughter to initiate a Dysphagia 1(puree) diet with honey-thickened liquids to promote safe swallowing and conserve energy for increased po intake.  MBS recommended to assess swallow function as pt able prior to d/c to SNF as daughter reports overt s/sx of aspiration and is concerned with return to SNF and level of A provided/required.  ST will f/u in acute setting for dysphagia tx/management.  Thank you for this consult.   HPI HPI: Harry [name] is a 77 y.o. male with medical history significant of Lewy body dementia, diabetes type 2, CKD 3A, anemia, hypertension, hyperlipidemia, syncope, peripheral arterial disease status post angioplasty and stenting, depression, glaucoma presenting with altered mental status. CTn Chest 09/08/22: IMPRESSION: 1. Bilateral lower lobe bronchial wall thickening with ground-glass attenuation and thickening of the peribronchovascular interstitium. Findings are  concerning for bronchopneumonia; ST recommended NPO status with limited BSE primarily d/t pt's altered mentation.  ST reconsulted when daughter arrived for potential advancement of diet.      SLP Plan  MBS      Recommendations for follow up therapy are one component of a multi-disciplinary discharge planning process, led by the attending physician.  Recommendations may be updated based on patient status, additional functional criteria and insurance authorization.    Recommendations  Diet recommendations: Dysphagia 1 (puree);Honey-thick liquid Liquids provided via: Teaspoon Medication Administration: Crushed with puree Supervision: Full supervision/cueing for compensatory strategies Compensations: Minimize environmental distractions;Slow rate;Small sips/bites Postural Changes and/or Swallow Maneuvers: Seated upright 90 degrees                  Oral care BID;Staff/trained caregiver to provide oral care   Frequent or constant Supervision/Assistance Dysphagia, oropharyngeal phase (R13.12)     MBS     Pat Bobbette Eakes,M.S., CCC-SLP  09/08/2022, 4:36 PM

## 2022-09-08 NOTE — Plan of Care (Signed)

## 2022-09-08 NOTE — Progress Notes (Addendum)
Pharmacy Antibiotic Note  Harry [name] is a 77 y.o. male admitted on 09/06/2022 from facility as unresponsive, hypotensive, concern for sepsis.  Pharmacy has been consulted for vancomycin and cefepime dosing.  Renal function improving, Scr down to 1.00 today from 1.98 yesterday (baseline 1.2-1.5). WBC down slightly at 18.5k today. Afebrile. CT abdomen concerning for bronchopneumonia.   Blood cultures positive for GPCs pending speciation and susceptibility. Urine culture suggests recollection.   Plan: Increase vancomycin to 1500mg  q24 hours (eAUC 468, IBW, Vd 0.72, Scr 1.0)  Monitor renal function, cultures and clinical progression to narrow  Continue cefepime 2g IV q12h Continue Metronidazole 500 mg IV q12h    Height: 5\' 8"  (172.7 cm) Weight: 79.9 kg (176 lb 2.4 oz) IBW/kg (Calculated) : 68.4  Temp (24hrs), Avg:98.6 F (37 C), Min:98.3 F (36.8 C), Max:99.1 F (37.3 C)  Recent Labs  Lab 09/06/22 1000 09/06/22 1208 09/06/22 1755 09/06/22 1940 09/07/22 0131 09/07/22 0737 09/07/22 1833 09/07/22 2126 09/08/22 0715  WBC 12.4*  --  17.8* 17.6* 19.8* 19.5*  --   --  18.2*  CREATININE 2.45*  --   --   --  1.98*  --   --   --  1.00  LATICACIDVEN 4.2* 5.1* 3.7*  --   --   --  1.6 1.5  --      Estimated Creatinine Clearance: 61.8 mL/min (by C-G formula based on SCr of 1 mg/dL).    Allergies  Allergen Reactions   Lipitor [Atorvastatin] Other (See Comments)    Myalgias   Statins Other (See Comments)    Myalgias  Pt tolerating rosuvastatin 5mg  QD   Pravachol [Pravastatin] Rash    Thank you for involving pharmacy in the patient's care.   Blane Ohara, PharmD  PGY2 Pharmacy Resident

## 2022-09-08 NOTE — Progress Notes (Signed)
[facility]                                                                               Harry [name], is a 77 y.o. male, DOB - Jun 06, 1945, WGN:[id number] Admit date - 09/06/2022    Outpatient Primary MD for the patient is System, Provider Not In  LOS - 2  days    Brief summary  Patient is a 77 year old male with history of fairly advanced dementia (query type.  Query Lewy body dementia versus vascular dementia versus Alzheimer's dementia). Other medical history includes chronic kidney disease stage IIIa, diabetes mellitus, anemia, hypertension, hyperlipidemia and syncope about peripheral vascular disease s/p angioplasty and stenting, depression and glaucoma.  Patient was admitted with altered mental status, volume depletion, acute kidney injury and likely UTI.  Urine cultures growing multiple.  The species.  MRI and CT head negative for acute events.  09/08/2022: Patient seen alongside patient's daughter.  Swallowing evaluation is also ongoing currently.  Altered mentation has improved.  Blood pressure has improved significantly.  Serum creatinine has improved from 2.45-1.  Potassium of 3.3 is noted.  Leukocytosis persists.  Hemoglobin is about stable.    Assessment & Plan  Severe sepsis with septic shock:  -Likely sepsis secondary to UTI. -Chest x-ray looks good. -Hypotension has resolved. -Continue IV vancomycin, cefepime and Flagyl. -On admission, patient was hypotensive, with  leukocytosis, tachypnea, elevated lactic acid on admission.  -Urine culture grew multiple organisms.  Will repeat urine culture.  -Continue to monitor renal function and electrolytes.  Acute encephalopathy, multifactorial, likely combined toxic and metabolic: -Acute UTI, volume depletion, acute kidney injury on presentation. -Urine cultures growing multiple organisms. -Encephalopathy is resolving. -Address and treat any other underlying problems.   Acute versus chronic anemia of unclear  etiology.  -Hemoglobin is above baseline. -Continue to monitor H/H.    Acute on stage 3 a CKD:  Baseline creatinine is around 1.5, admitted with a creatinine of 2. 45,  Gently hydrate and repeat renal parameters in am.  09/08/2022:  Insulin dependent DM;  Continue with SSI.   H/o CVA with hemiplegia:  Was on aspirin, plavix and statin.  DAPT on hold for anemia.   Hypertension;  Episodes of hypotension resolved on admission.  Currently BP parameters are optimal.   H/O PAD On DAPT and statin , which were on hold due to FOBT positive and anemia.  Continue with statin for now.    Estimated body mass index is 26.78 kg/m as calculated from the following:   Height as of this encounter: 5\' 8"  (1.727 m).   Weight as of this encounter: 79.9 kg.  Code Status: full code.  DVT Prophylaxis:  SCDs Start: 09/06/22 1341   Level of Care: Level of care: Progressive Family Communication: none at bedside.   Disposition Plan:     Remains inpatient appropriate:  IV antibiotics, anemia work up.  AMS.   Procedures:  None.   Consultants:   Gastroenterology.   Antimicrobials:  IV cefepime.  Anti-infectives (From admission, onward)    Start     Dose/Rate Route Frequency Ordered Stop   09/08/22 1315  ceFEPIme (MAXIPIME) 2 g in sodium chloride 0.9 %  100 mL IVPB        2 g 200 mL/hr over 30 Minutes Intravenous Every 12 hours 09/08/22 1304     09/08/22 1300  vancomycin (VANCOREADY) IVPB 1500 mg/300 mL        1,500 mg 150 mL/hr over 120 Minutes Intravenous Every 24 hours 09/08/22 1212     09/07/22 1400  ceFEPIme (MAXIPIME) 2 g in sodium chloride 0.9 % 100 mL IVPB  Status:  Discontinued        2 g 200 mL/hr over 30 Minutes Intravenous Every 24 hours 09/06/22 1250 09/08/22 1304   09/07/22 1230  vancomycin (VANCOREADY) IVPB 750 mg/150 mL  Status:  Discontinued        750 mg 150 mL/hr over 60 Minutes Intravenous Every 24 hours 09/07/22 1139 09/08/22 1212   09/06/22 2200  metroNIDAZOLE  (FLAGYL) IVPB 500 mg        500 mg 100 mL/hr over 60 Minutes Intravenous Every 12 hours 09/06/22 1344     09/06/22 1250  vancomycin variable dose per unstable renal function (pharmacist dosing)  Status:  Discontinued         Does not apply See admin instructions 09/06/22 1250 09/07/22 1159   09/06/22 1015  metroNIDAZOLE (FLAGYL) IVPB 500 mg        500 mg 100 mL/hr over 60 Minutes Intravenous  Once 09/06/22 1002 09/06/22 1216   09/06/22 1015  ceFEPIme (MAXIPIME) 2 g in sodium chloride 0.9 % 100 mL IVPB        2 g 200 mL/hr over 30 Minutes Intravenous  Once 09/06/22 1010 09/06/22 1133   09/06/22 1015  vancomycin (VANCOREADY) IVPB 1500 mg/300 mL        1,500 mg 150 mL/hr over 120 Minutes Intravenous  Once 09/06/22 1010 09/06/22 1436        Medications  Scheduled Meds:  Chlorhexidine Gluconate Cloth  6 each Topical Daily   insulin aspart  0-9 Units Subcutaneous Q4H   pantoprazole (PROTONIX) IV  40 mg Intravenous Q12H   pyridostigmine  30 mg Oral Q8H   rosuvastatin  5 mg Oral QHS   sertraline  25 mg Oral Daily   sodium chloride flush  3 mL Intravenous Q12H   tamsulosin  0.4 mg Oral Daily   Continuous Infusions:  ceFEPime (MAXIPIME) IV 2 g (09/08/22 1536)   dextrose 5 % and 0.9 % NaCl 75 mL/hr at 09/07/22 2035   folic acid 1 mg in sodium chloride 0.9 % 50 mL IVPB 1 mg (09/08/22 1037)   metronidazole 500 mg (09/08/22 0854)   vancomycin 1,500 mg (09/08/22 1301)   PRN Meds:.acetaminophen **OR** acetaminophen, polyethylene glycol    Subjective:   Harry [name] was seen and examined today. Pt alert, still non verbal. Opening eyes with verbal cues, appears comfortable.   Objective:   Vitals:   09/08/22 1000 09/08/22 1200 09/08/22 1400 09/08/22 1549  BP: (!) 160/82 (!) 147/77 (!) 150/78 (!) 156/81  Pulse: 79 78 77 79  Resp: 17 14 11 16   Temp:  97.8 F (36.6 C)    TempSrc:  Axillary    SpO2: 97% 96% 98% 96%  Weight:      Height:        Intake/Output Summary (Last 24  hours) at 09/08/2022 1645 Last data filed at 09/08/2022 0844 Gross per 24 hour  Intake 884.22 ml  Output 1752 ml  Net -867.78 ml    Filed Weights   09/06/22 1127 09/06/22 2226 09/08/22 0500  Weight:  80.7 kg 79.7 kg 79.9 kg     Exam General exam: Chronically ill looking.  Not in any distress.  Dry tongue is reported.   Neck: Supple.  No JVD.   Lungs: Clear to auscultation. CVS: S1-S2. Neurology ill appearing elderly gentleman, not in distress.  Respiratory system: Clear to auscultation.  Cardiovascular system: S1 & S2  Gastrointestinal system: Abdomen is nondistended, soft and nontender.  Central nervous system: Awake and alert.     Data Reviewed:  I have personally reviewed following labs and imaging studies   CBC Lab Results  Component Value Date   WBC 18.2 (H) 09/08/2022   RBC 2.91 (L) 09/08/2022   HGB 8.8 (L) 09/08/2022   HCT 28.3 (L) 09/08/2022   MCV 97.3 09/08/2022   MCH 30.2 09/08/2022   PLT 150 09/08/2022   MCHC 31.1 09/08/2022   RDW 19.4 (H) 09/08/2022   LYMPHSABS 1.2 09/08/2022   MONOABS 1.1 (H) 09/08/2022   EOSABS 0.2 09/08/2022   BASOSABS 0.1 09/08/2022     Last metabolic panel Lab Results  Component Value Date   NA 144 09/08/2022   K 3.3 (L) 09/08/2022   CL 109 09/08/2022   CO2 21 (L) 09/08/2022   BUN 28 (H) 09/08/2022   CREATININE 1.00 09/08/2022   GLUCOSE 160 (H) 09/08/2022   GFRNONAA >60 09/08/2022   GFRAA 51 (L) 10/30/2019   CALCIUM 7.6 (L) 09/08/2022   PHOS 2.9 06/05/2022   PROT 5.3 (L) 09/08/2022   ALBUMIN 2.3 (L) 09/08/2022   LABGLOB 3.2 11/11/2015   AGRATIO 1.3 11/11/2015   BILITOT 1.2 09/08/2022   ALKPHOS 66 09/08/2022   AST 48 (H) 09/08/2022   ALT 30 09/08/2022   ANIONGAP 14 09/08/2022    CBG (last 3)  Recent Labs    09/08/22 0805 09/08/22 1221 09/08/22 1550  GLUCAP 168* 145* 137*       Coagulation Profile: Recent Labs  Lab 09/06/22 1000  INR 1.3*      Radiology Studies: MR BRAIN WO CONTRAST  Result  Date: 09/08/2022 CLINICAL DATA:  Altered mental status EXAM: MRI HEAD WITHOUT CONTRAST TECHNIQUE: Multiplanar, multiecho pulse sequences of the brain and surrounding structures were obtained without intravenous contrast. COMPARISON:  None Available. FINDINGS: Brain: No acute infarct, mass effect or extra-axial collection. Chronic microhemorrhages in the cerebellum and brainstem. There is confluent hyperintense T2-weighted signal within the white matter. Generalized volume loss. Old bilateral cerebellar and occipital lobe infarcts. The midline structures are normal. Vascular: Major flow voids are preserved. Skull and upper cervical spine: Normal calvarium and skull base. Visualized upper cervical spine and soft tissues are normal. Sinuses/Orbits:No paranasal sinus fluid levels or advanced mucosal thickening. No mastoid or middle ear effusion. There are bilateral lens replacements. IMPRESSION: 1. No acute intracranial abnormality. 2. Old bilateral cerebellar and occipital lobe infarcts. 3. Findings of chronic small vessel ischemia and volume loss. Electronically Signed   By: Deatra Robinson M.D.   On: 09/08/2022 02:02    Time spent: 55 minutes.   Barnetta Chapel M.D. [facility] 09/08/2022, 4:45 PM  Available via Epic secure chat 7am-7pm After 7 pm, please refer to night coverage provider listed on amion.
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
## 2022-09-09 ENCOUNTER — Inpatient Hospital Stay (HOSPITAL_COMMUNITY): Payer: Medicare Other

## 2022-09-09 DIAGNOSIS — E538 Deficiency of other specified B group vitamins: Secondary | ICD-10-CM

## 2022-09-09 DIAGNOSIS — G9340 Encephalopathy, unspecified: Secondary | ICD-10-CM | POA: Diagnosis not present

## 2022-09-09 LAB — GLUCOSE, CAPILLARY
Glucose-Capillary: 131 mg/dL — ABNORMAL HIGH (ref 70–99)
Glucose-Capillary: 151 mg/dL — ABNORMAL HIGH (ref 70–99)
Glucose-Capillary: 237 mg/dL — ABNORMAL HIGH (ref 70–99)
Glucose-Capillary: 258 mg/dL — ABNORMAL HIGH (ref 70–99)
Glucose-Capillary: 264 mg/dL — ABNORMAL HIGH (ref 70–99)
Glucose-Capillary: 273 mg/dL — ABNORMAL HIGH (ref 70–99)

## 2022-09-09 LAB — COMPREHENSIVE METABOLIC PANEL
ALT: 24 U/L (ref 0–44)
AST: 31 U/L (ref 15–41)
Albumin: 2.2 g/dL — ABNORMAL LOW (ref 3.5–5.0)
Alkaline Phosphatase: 61 U/L (ref 38–126)
Anion gap: 6 (ref 5–15)
BUN: 18 mg/dL (ref 8–23)
CO2: 20 mmol/L — ABNORMAL LOW (ref 22–32)
Calcium: 7.2 mg/dL — ABNORMAL LOW (ref 8.9–10.3)
Chloride: 113 mmol/L — ABNORMAL HIGH (ref 98–111)
Creatinine, Ser: 1.12 mg/dL (ref 0.61–1.24)
GFR, Estimated: >60 mL/min (ref >60)
Glucose, Bld: 235 mg/dL — ABNORMAL HIGH (ref 70–99)
Potassium: 3.2 mmol/L — ABNORMAL LOW (ref 3.5–5.1)
Sodium: 139 mmol/L (ref 135–145)
Total Bilirubin: 1 mg/dL (ref 0.3–1.2)
Total Protein: 5.2 g/dL — ABNORMAL LOW (ref 6.5–8.1)

## 2022-09-09 LAB — CULTURE, BLOOD (ROUTINE X 2)

## 2022-09-09 MED ORDER — POTASSIUM CHLORIDE 20 MEQ PO PACK
40.0000 meq | PACK | Freq: Once | ORAL | Status: AC
  Administered 2022-09-09: 40 meq via ORAL
  Filled 2022-09-09: qty 2

## 2022-09-09 MED ORDER — CARVEDILOL 12.5 MG PO TABS
12.5000 mg | ORAL_TABLET | Freq: Two times a day (BID) | ORAL | Status: DC
  Administered 2022-09-09 – 2022-09-17 (×16): 12.5 mg via ORAL
  Filled 2022-09-09 (×17): qty 1

## 2022-09-09 MED ORDER — HYDRALAZINE HCL 25 MG PO TABS
25.0000 mg | ORAL_TABLET | Freq: Four times a day (QID) | ORAL | Status: DC | PRN
  Administered 2022-09-09 – 2022-09-15 (×4): 25 mg via ORAL
  Filled 2022-09-09 (×5): qty 1

## 2022-09-09 MED ORDER — KCL IN DEXTROSE-NACL 20-5-0.9 MEQ/L-%-% IV SOLN
INTRAVENOUS | Status: DC
  Filled 2022-09-09: qty 1000

## 2022-09-09 NOTE — Progress Notes (Signed)
[facility]                                                                               Harry [name], is a 77 y.o. male, DOB - 1945/03/31, WJX:[id number] Admit date - 09/06/2022    Outpatient Primary MD for the patient is System, Provider Not In  LOS - 3  days    Brief summary  Patient is a 77 year old male with history of fairly advanced dementia (query type.  Query Lewy body dementia versus vascular dementia versus Alzheimer's dementia). Other medical history includes chronic kidney disease stage IIIa, diabetes mellitus, anemia, hypertension, hyperlipidemia and syncope about peripheral vascular disease s/p angioplasty and stenting, depression and glaucoma.  Patient was admitted with altered mental status, volume depletion, acute kidney injury and likely UTI.  Urine cultures growing multiple.  The species.  MRI and CT head negative for acute events.  09/08/2022: Patient seen alongside patient's daughter.  Swallowing evaluation is also ongoing currently.  Altered mentation has improved.  Blood pressure has improved significantly.  Serum creatinine has improved from 2.45-1.  Potassium of 3.3 is noted.  Leukocytosis persists.  Hemoglobin is about stable.  09/09/2022: Patient seen.  Patient remains ill looking.  Blood pressure has been elevated (172-189/88 mmHg).  Potassium of 3.2 is noted.  Albumin is also 2.2.  Urine culture grew multiple species.  Blood culture has not grown any Intertrochanteric.    Assessment & Plan  Severe sepsis with septic shock:  -Likely sepsis secondary to UTI. -Chest x-ray looks good. -Hypotension has resolved. -Continue IV vancomycin, cefepime and Flagyl. -On admission, patient was hypotensive, with  leukocytosis, tachypnea, elevated lactic acid on admission.  -Urine culture grew multiple organisms.  Will repeat urine culture.  -Continue to monitor renal function and electrolytes. 09/09/2022: Sepsis physiology has resolved.  Continue IV  antibiotics.  Acute encephalopathy, multifactorial, likely combined toxic and metabolic: -Acute UTI, volume depletion, acute kidney injury on presentation. -Urine cultures growing multiple organisms. -Encephalopathy is resolving. -Address and treat any other underlying problems. 09/09/2022: Continues to resolve.   Acute versus chronic anemia of unclear etiology.  -Hemoglobin is above baseline. -Continue to monitor H/H.    Acute on stage 3 a CKD:  Baseline creatinine is around 1.5, admitted with a creatinine of 2. 45,  Gently hydrate and repeat renal parameters in am.  09/09/2022: Improved significantly.  IV fluid be discontinued.  Insulin dependent DM;  Continue with SSI.   H/o CVA with hemiplegia:  Was on aspirin, plavix and statin.  DAPT on hold for anemia.   Hypertension;  Episodes of hypotension resolved on admission.  Currently BP parameters are optimal.   H/O PAD On DAPT and statin , which were on hold due to FOBT positive and anemia.  Continue with statin for now.    Estimated body mass index is 28.53 kg/m as calculated from the following:   Height as of this encounter: 5\' 8"  (1.727 m).   Weight as of this encounter: 85.1 kg.  Code Status: full code.  DVT Prophylaxis:  SCDs Start: 09/06/22 1341   Level of Care: Level of care: Progressive Family Communication: none at bedside.   Disposition Plan:  Remains inpatient appropriate:  IV antibiotics, anemia work up.  AMS.   Procedures:  None.   Consultants:   Gastroenterology.   Antimicrobials:  IV cefepime.  Anti-infectives (From admission, onward)    Start     Dose/Rate Route Frequency Ordered Stop   09/08/22 1315  ceFEPIme (MAXIPIME) 2 g in sodium chloride 0.9 % 100 mL IVPB        2 g 200 mL/hr over 30 Minutes Intravenous Every 12 hours 09/08/22 1304     09/08/22 1300  vancomycin (VANCOREADY) IVPB 1500 mg/300 mL        1,500 mg 150 mL/hr over 120 Minutes Intravenous Every 24 hours 09/08/22 1212      09/07/22 1400  ceFEPIme (MAXIPIME) 2 g in sodium chloride 0.9 % 100 mL IVPB  Status:  Discontinued        2 g 200 mL/hr over 30 Minutes Intravenous Every 24 hours 09/06/22 1250 09/08/22 1304   09/07/22 1230  vancomycin (VANCOREADY) IVPB 750 mg/150 mL  Status:  Discontinued        750 mg 150 mL/hr over 60 Minutes Intravenous Every 24 hours 09/07/22 1139 09/08/22 1212   09/06/22 2200  metroNIDAZOLE (FLAGYL) IVPB 500 mg        500 mg 100 mL/hr over 60 Minutes Intravenous Every 12 hours 09/06/22 1344     09/06/22 1250  vancomycin variable dose per unstable renal function (pharmacist dosing)  Status:  Discontinued         Does not apply See admin instructions 09/06/22 1250 09/07/22 1159   09/06/22 1015  metroNIDAZOLE (FLAGYL) IVPB 500 mg        500 mg 100 mL/hr over 60 Minutes Intravenous  Once 09/06/22 1002 09/06/22 1216   09/06/22 1015  ceFEPIme (MAXIPIME) 2 g in sodium chloride 0.9 % 100 mL IVPB        2 g 200 mL/hr over 30 Minutes Intravenous  Once 09/06/22 1010 09/06/22 1133   09/06/22 1015  vancomycin (VANCOREADY) IVPB 1500 mg/300 mL        1,500 mg 150 mL/hr over 120 Minutes Intravenous  Once 09/06/22 1010 09/06/22 1436        Medications  Scheduled Meds:  carvedilol  12.5 mg Oral BID WC   Chlorhexidine Gluconate Cloth  6 each Topical Daily   insulin aspart  0-9 Units Subcutaneous Q4H   pantoprazole (PROTONIX) IV  40 mg Intravenous Q12H   potassium chloride  40 mEq Oral Once   pyridostigmine  30 mg Oral Q8H   rosuvastatin  5 mg Oral QHS   sertraline  25 mg Oral Daily   sodium chloride flush  3 mL Intravenous Q12H   tamsulosin  0.4 mg Oral Daily   Continuous Infusions:  ceFEPime (MAXIPIME) IV 2 g (09/09/22 0145)   dextrose 5 % and 0.9 % NaCl with KCl 20 mEq/L     folic acid 1 mg in sodium chloride 0.9 % 50 mL IVPB 1 mg (09/09/22 0943)   metronidazole 500 mg (09/09/22 0811)   vancomycin Stopped (09/08/22 1501)   PRN Meds:.acetaminophen **OR** acetaminophen, hydrALAZINE,  polyethylene glycol    Subjective:   [name] was seen and examined today. Pt alert, still non verbal. Opening eyes with verbal cues, appears comfortable.   Objective:   Vitals:   09/09/22 0615 09/09/22 0748 09/09/22 0802 09/09/22 0941  BP:  (!) 189/88 (!) 179/84 (!) 172/86  Pulse:  77 75   Resp:  17 15 13   Temp:  98.1 F (36.7 C)    TempSrc:  Oral    SpO2:  97% 99%   Weight: 85.1 kg     Height:        Intake/Output Summary (Last 24 hours) at 09/09/2022 1256 Last data filed at 09/09/2022 0825 Gross per 24 hour  Intake 3201.59 ml  Output 2125 ml  Net 1076.59 ml    Filed Weights   09/06/22 2226 09/08/22 0500 09/09/22 0615  Weight: 79.7 kg 79.9 kg 85.1 kg     Exam General exam: Chronically ill looking.  Not in any distress.  Dry tongue is reported.   Neck: Supple.  No JVD.   Lungs: Clear to auscultation. CVS: S1-S2. Neurology ill appearing elderly gentleman, not in distress.  Respiratory system: Clear to auscultation.  Cardiovascular system: S1 & S2  Gastrointestinal system: Abdomen is nondistended, soft and nontender.  Central nervous system: Awake and alert.     Data Reviewed:  I have personally reviewed following labs and imaging studies   CBC Lab Results  Component Value Date   WBC 16.1 (H) 09/08/2022   RBC 2.81 (L) 09/08/2022   HGB 8.5 (L) 09/08/2022   HCT 26.8 (L) 09/08/2022   MCV 95.4 09/08/2022   MCH 30.2 09/08/2022   PLT 155 09/08/2022   MCHC 31.7 09/08/2022   RDW 18.9 (H) 09/08/2022   LYMPHSABS 1.3 09/08/2022   MONOABS 0.6 09/08/2022   EOSABS 0.2 09/08/2022   BASOSABS 0.0 09/08/2022     Last metabolic panel Lab Results  Component Value Date   NA 139 09/09/2022   K 3.2 (L) 09/09/2022   CL 113 (H) 09/09/2022   CO2 20 (L) 09/09/2022   BUN 18 09/09/2022   CREATININE 1.12 09/09/2022   GLUCOSE 235 (H) 09/09/2022   GFRNONAA >60 09/09/2022   GFRAA 51 (L) 10/30/2019   CALCIUM 7.2 (L) 09/09/2022   PHOS 2.9 06/05/2022   PROT 5.2 (L)  09/09/2022   ALBUMIN 2.2 (L) 09/09/2022   LABGLOB 3.2 11/11/2015   AGRATIO 1.3 11/11/2015   BILITOT 1.0 09/09/2022   ALKPHOS 61 09/09/2022   AST 31 09/09/2022   ALT 24 09/09/2022   ANIONGAP 6 09/09/2022    CBG (last 3)  Recent Labs    09/09/22 0439 09/09/22 0736 09/09/22 1230  GLUCAP 151* 131* 264*       Coagulation Profile: Recent Labs  Lab 09/06/22 1000  INR 1.3*      Radiology Studies: MR BRAIN WO CONTRAST  Result Date: 09/08/2022 CLINICAL DATA:  Altered mental status EXAM: MRI HEAD WITHOUT CONTRAST TECHNIQUE: Multiplanar, multiecho pulse sequences of the brain and surrounding structures were obtained without intravenous contrast. COMPARISON:  None Available. FINDINGS: Brain: No acute infarct, mass effect or extra-axial collection. Chronic microhemorrhages in the cerebellum and brainstem. There is confluent hyperintense T2-weighted signal within the white matter. Generalized volume loss. Old bilateral cerebellar and occipital lobe infarcts. The midline structures are normal. Vascular: Major flow voids are preserved. Skull and upper cervical spine: Normal calvarium and skull base. Visualized upper cervical spine and soft tissues are normal. Sinuses/Orbits:No paranasal sinus fluid levels or advanced mucosal thickening. No mastoid or middle ear effusion. There are bilateral lens replacements. IMPRESSION: 1. No acute intracranial abnormality. 2. Old bilateral cerebellar and occipital lobe infarcts. 3. Findings of chronic small vessel ischemia and volume loss. Electronically Signed   By: Deatra Robinson M.D.   On: 09/08/2022 02:02    Time spent: 55 minutes.   Barnetta Chapel M.D. [facility] 09/09/2022,  12:56 PM  Available via Epic secure chat 7am-7pm After 7 pm, please refer to night coverage provider listed on amion.

## 2022-09-09 NOTE — Progress Notes (Signed)
Speech Language Pathology Treatment: Dysphagia  Patient Details Name: Harry [name] MRN: [id number] DOB: 26-Sep-1945 Today's Date: 09/09/2022 Time: 4098-1191 SLP Time Calculation (min) (ACUTE ONLY): 11 min  Assessment / Plan / Recommendation Clinical Impression  Pt very sleepy this am. RN reports that he was fed breakfast earlier this morning, was alert and ate fairly well with no concerns for gross aspiration.  He is currently drowsy - alerted long enough to accept some sips of honey-thick liquid.  There was prolonged oral holding with eventual swallow and max verbal/visual cues; no s/s of aspiration. Recommend holding on MBS today given current level of arousal. Will follow-up next date to determine readiness. D/W RN.   HPI HPI: Harry [name] is a 77 y.o. male with medical history significant of Lewy body dementia, diabetes type 2, CKD 3A, anemia, hypertension, hyperlipidemia, syncope, peripheral arterial disease status post angioplasty and stenting, depression, glaucoma presenting with altered mental status. CTn Chest 09/08/22: IMPRESSION: 1. Bilateral lower lobe bronchial wall thickening with ground-glass attenuation and thickening of the peribronchovascular interstitium. Findings are concerning for bronchopneumonia; ST recommended NPO status with limited BSE primarily d/t pt's altered mentation.  ST reconsulted when daughter arrived for potential advancement of diet. Bedside swallow eval 3/31 of this year - dysphagia 3/thins was recommended.      SLP Plan  MBS      Recommendations for follow up therapy are one component of a multi-disciplinary discharge planning process, led by the attending physician.  Recommendations may be updated based on patient status, additional functional criteria and insurance authorization.    Recommendations  Diet recommendations: Dysphagia 1 (puree);Honey-thick liquid Liquids provided via: Teaspoon Medication Administration: Crushed with puree Supervision:  Full supervision/cueing for compensatory strategies Compensations: Minimize environmental distractions;Slow rate;Small sips/bites Postural Changes and/or Swallow Maneuvers: Seated upright 90 degrees                  Oral care BID;Staff/trained caregiver to provide oral care   Frequent or constant Supervision/Assistance Dysphagia, oropharyngeal phase (R13.12)     MBS    Harry [name] L. Samson Frederic, MA CCC/SLP Clinical Specialist - Acute Care SLP Acute Rehabilitation Services Office number [phone]  Harry [name]  09/09/2022, 11:12 AM

## 2022-09-10 ENCOUNTER — Inpatient Hospital Stay (HOSPITAL_COMMUNITY): Payer: Medicare Other

## 2022-09-10 DIAGNOSIS — N183 Chronic kidney disease, stage 3 unspecified: Secondary | ICD-10-CM

## 2022-09-10 DIAGNOSIS — Z452 Encounter for adjustment and management of vascular access device: Secondary | ICD-10-CM

## 2022-09-10 DIAGNOSIS — R7881 Bacteremia: Secondary | ICD-10-CM | POA: Diagnosis not present

## 2022-09-10 DIAGNOSIS — N179 Acute kidney failure, unspecified: Secondary | ICD-10-CM | POA: Diagnosis not present

## 2022-09-10 DIAGNOSIS — G9340 Encephalopathy, unspecified: Secondary | ICD-10-CM | POA: Diagnosis not present

## 2022-09-10 DIAGNOSIS — B954 Other streptococcus as the cause of diseases classified elsewhere: Secondary | ICD-10-CM

## 2022-09-10 DIAGNOSIS — G9341 Metabolic encephalopathy: Secondary | ICD-10-CM

## 2022-09-10 DIAGNOSIS — Z792 Long term (current) use of antibiotics: Secondary | ICD-10-CM | POA: Diagnosis not present

## 2022-09-10 DIAGNOSIS — D649 Anemia, unspecified: Secondary | ICD-10-CM | POA: Diagnosis not present

## 2022-09-10 DIAGNOSIS — R195 Other fecal abnormalities: Secondary | ICD-10-CM | POA: Diagnosis not present

## 2022-09-10 LAB — GLUCOSE, CAPILLARY
Glucose-Capillary: 205 mg/dL — ABNORMAL HIGH (ref 70–99)
Glucose-Capillary: 117 mg/dL — ABNORMAL HIGH (ref 70–99)
Glucose-Capillary: 97 mg/dL (ref 70–99)
Glucose-Capillary: 189 mg/dL — ABNORMAL HIGH (ref 70–99)
Glucose-Capillary: 269 mg/dL — ABNORMAL HIGH (ref 70–99)
Glucose-Capillary: 292 mg/dL — ABNORMAL HIGH (ref 70–99)
Glucose-Capillary: 269 mg/dL — ABNORMAL HIGH (ref 70–99)

## 2022-09-10 LAB — CBC WITH DIFFERENTIAL/PLATELET
Abs Immature Granulocytes: 0.06 10*3/uL (ref 0.00–0.07)
Basophils Absolute: 0 10*3/uL (ref 0.0–0.1)
Basophils Relative: 0 %
Eosinophils Absolute: 0.3 10*3/uL (ref 0.0–0.5)
Eosinophils Relative: 3 %
HCT: 27.2 % — ABNORMAL LOW (ref 39.0–52.0)
Hemoglobin: 8.8 g/dL — ABNORMAL LOW (ref 13.0–17.0)
Immature Granulocytes: 1 %
Lymphocytes Relative: 15 %
Lymphs Abs: 1.4 10*3/uL (ref 0.7–4.0)
MCH: 31 pg (ref 26.0–34.0)
MCHC: 32.4 g/dL (ref 30.0–36.0)
MCV: 95.8 fL (ref 80.0–100.0)
Monocytes Absolute: 1.1 10*3/uL — ABNORMAL HIGH (ref 0.1–1.0)
Monocytes Relative: 11 %
Neutro Abs: 6.6 10*3/uL (ref 1.7–7.7)
Neutrophils Relative %: 70 %
Platelets: 156 10*3/uL (ref 150–400)
RBC: 2.84 MIL/uL — ABNORMAL LOW (ref 4.22–5.81)
RDW: 18.2 % — ABNORMAL HIGH (ref 11.5–15.5)
WBC: 9.4 10*3/uL (ref 4.0–10.5)
nRBC: 0 % (ref 0.0–0.2)

## 2022-09-10 LAB — URINALYSIS, ROUTINE W REFLEX MICROSCOPIC
Glucose, UA: 50 mg/dL — AB
Ketones, ur: 5 mg/dL — AB
Nitrite: NEGATIVE
Protein, ur: 100 mg/dL — AB
RBC / HPF: >50 RBC/hpf (ref 0–5)
Specific Gravity, Urine: 1.029 (ref 1.005–1.030)
pH: 5 (ref 5.0–8.0)

## 2022-09-10 LAB — RENAL FUNCTION PANEL
Albumin: 2.2 g/dL — ABNORMAL LOW (ref 3.5–5.0)
Anion gap: 7 (ref 5–15)
BUN: 14 mg/dL (ref 8–23)
CO2: 23 mmol/L (ref 22–32)
Calcium: 7.6 mg/dL — ABNORMAL LOW (ref 8.9–10.3)
Chloride: 110 mmol/L (ref 98–111)
Creatinine, Ser: 0.92 mg/dL (ref 0.61–1.24)
GFR, Estimated: >60 mL/min (ref >60)
Glucose, Bld: 196 mg/dL — ABNORMAL HIGH (ref 70–99)
Phosphorus: 1.5 mg/dL — ABNORMAL LOW (ref 2.5–4.6)
Potassium: 3.5 mmol/L (ref 3.5–5.1)
Sodium: 140 mmol/L (ref 135–145)

## 2022-09-10 LAB — CULTURE, BLOOD (ROUTINE X 2)

## 2022-09-10 LAB — MAGNESIUM: Magnesium: 1.5 mg/dL — ABNORMAL LOW (ref 1.7–2.4)

## 2022-09-10 MED ORDER — SODIUM CHLORIDE 0.9 % IV SOLN: 2.0000 g | Freq: Three times a day (TID) | INTRAVENOUS | Status: DC

## 2022-09-10 MED ORDER — INSULIN ASPART 100 UNIT/ML IJ SOLN
0.0000 [IU] | Freq: Every day | INTRAMUSCULAR | Status: DC
  Administered 2022-09-10: 3 [IU] via SUBCUTANEOUS
  Administered 2022-09-11 – 2022-09-12 (×2): 2 [IU] via SUBCUTANEOUS
  Administered 2022-09-13: 3 [IU] via SUBCUTANEOUS
  Administered 2022-09-14: 2 [IU] via SUBCUTANEOUS
  Administered 2022-09-15: 3 [IU] via SUBCUTANEOUS

## 2022-09-10 MED ORDER — MAGNESIUM SULFATE 4 GM/100ML IV SOLN
4.0000 g | Freq: Once | INTRAVENOUS | Status: AC
  Administered 2022-09-10: 4 g via INTRAVENOUS
  Filled 2022-09-10: qty 100

## 2022-09-10 MED ORDER — POTASSIUM PHOSPHATES 15 MMOLE/5ML IV SOLN
30.0000 mmol | Freq: Once | INTRAVENOUS | Status: AC
  Administered 2022-09-10: 30 mmol via INTRAVENOUS
  Filled 2022-09-10: qty 10

## 2022-09-10 MED ORDER — VANCOMYCIN HCL 1750 MG/350ML IV SOLN
1750.0000 mg | INTRAVENOUS | Status: DC
  Administered 2022-09-10 – 2022-09-13 (×4): 1750 mg via INTRAVENOUS
  Filled 2022-09-10 (×4): qty 350

## 2022-09-10 MED ORDER — FOLIC ACID 1 MG PO TABS
1.0000 mg | ORAL_TABLET | Freq: Every day | ORAL | Status: DC
  Administered 2022-09-11 – 2022-09-17 (×7): 1 mg via ORAL
  Filled 2022-09-10 (×7): qty 1

## 2022-09-10 MED ORDER — INSULIN ASPART 100 UNIT/ML IJ SOLN
0.0000 [IU] | Freq: Three times a day (TID) | INTRAMUSCULAR | Status: DC
  Administered 2022-09-10: 5 [IU] via SUBCUTANEOUS
  Administered 2022-09-10 – 2022-09-11 (×2): 2 [IU] via SUBCUTANEOUS
  Administered 2022-09-12: 1 [IU] via SUBCUTANEOUS
  Administered 2022-09-12: 2 [IU] via SUBCUTANEOUS
  Administered 2022-09-12 – 2022-09-13 (×2): 3 [IU] via SUBCUTANEOUS
  Administered 2022-09-13: 1 [IU] via SUBCUTANEOUS
  Administered 2022-09-14: 2 [IU] via SUBCUTANEOUS
  Administered 2022-09-14: 5 [IU] via SUBCUTANEOUS
  Administered 2022-09-15 – 2022-09-17 (×4): 2 [IU] via SUBCUTANEOUS

## 2022-09-10 NOTE — Progress Notes (Signed)
Pharmacy Antibiotic Note  Harry [name] is a 77 y.o. male admitted on 09/06/2022 from facility as unresponsive, hypotensive, concern for sepsis. CT abdomen concerning for bronchopneumonia. 1/4 BCx growing granulicatella adiacens. Pharmacy has been consulted for vancomycin and cefepime dosing. Also on Flagyl per MD.  Renal function improving, Scr down to 0.92 today.  Plan: Adjust vancomycin to 1750mg  IV q24 hours (goal AUC 400-550; estimated AUC 478, SCr used 0.92) Adjust cefepime to 2g IV q8h Metronidazole 500 mg IV q12h per MD Monitor clinical progress, c/s, renal function, vancomycin levels as indicated F/u de-escalation plan/LOT - ID likely to see 7/8   Height: 5\' 8"  (172.7 cm) Weight: 84.6 kg (186 lb 8.2 oz) IBW/kg (Calculated) : 68.4  Temp (24hrs), Avg:98.1 F (36.7 C), Min:98 F (36.7 C), Max:98.2 F (36.8 C)  Recent Labs  Lab 09/06/22 1000 09/06/22 1208 09/06/22 1755 09/06/22 1940 09/07/22 0131 09/07/22 0737 09/07/22 1833 09/07/22 2126 09/08/22 0715 09/08/22 1808 09/09/22 0125 09/10/22 0134  WBC 12.4*  --  17.8*   < > 19.8* 19.5*  --   --  18.2* 16.1*  --  9.4  CREATININE 2.45*  --   --   --  1.98*  --   --   --  1.00  --  1.12 0.92  LATICACIDVEN 4.2* 5.1* 3.7*  --   --   --  1.6 1.5  --   --   --   --    < > = values in this interval not displayed.     Estimated Creatinine Clearance: 73.5 mL/min (by C-G formula based on SCr of 0.92 mg/dL).    Allergies  Allergen Reactions   Lipitor [Atorvastatin] Other (See Comments)    Myalgias   Statins Other (See Comments)    Myalgias  Pt tolerating rosuvastatin 5mg  QD   Pravachol [Pravastatin] Rash    Leia Alf, PharmD, BCPS Please check AMION for all [facility] contact numbers Clinical Pharmacist 09/10/2022 9:51 AM

## 2022-09-10 NOTE — Progress Notes (Signed)
Speech Language Pathology Treatment: Dysphagia  Patient Details Name: Harry [name] MRN: [id number] DOB: Feb 18, 1946 Today's Date: 09/10/2022 Time: 0851-0906 SLP Time Calculation (min) (ACUTE ONLY): 15 min  Assessment / Plan / Recommendation Clinical Impression  Harry [name] was much more awake and interactive this am.  Trials of thin liquids continued to elicit consistent wet cough, concerning for aspiration. Honey thick liquids were consumed without overt difficulty. Pt self-suctioned after cough with moderate physical assist. Recommend proceeding with MBS today to determine nature of dysphagia.  D/W RN. SLP will follow.   HPI HPI: Harry [name] is a 77 y.o. male with medical history significant of Lewy body dementia, diabetes type 2, CKD 3A, anemia, hypertension, hyperlipidemia, syncope, peripheral arterial disease status post angioplasty and stenting, depression, glaucoma presenting with altered mental status. CTn Chest 09/08/22: IMPRESSION: 1. Bilateral lower lobe bronchial wall thickening with ground-glass attenuation and thickening of the peribronchovascular interstitium. Findings are concerning for bronchopneumonia. Bedside swallow eval 3/31 of this year - dysphagia 3/thins was recommended.      SLP Plan  MBS      Recommendations for follow up therapy are one component of a multi-disciplinary discharge planning process, led by the attending physician.  Recommendations may be updated based on patient status, additional functional criteria and insurance authorization.    Recommendations  Diet recommendations: Dysphagia 1 (puree);Honey-thick liquid Liquids provided via: Cup;Straw Medication Administration: Crushed with puree Supervision: Full supervision/cueing for compensatory strategies Compensations: Minimize environmental distractions;Slow rate;Small sips/bites                  Oral care BID;Staff/trained caregiver to provide oral care   Frequent or constant  Supervision/Assistance Dysphagia, oropharyngeal phase (R13.12)     MBS    Harry Febus L. Samson Frederic, MA CCC/SLP Clinical Specialist - Acute Care SLP Acute Rehabilitation Services Office number [phone]  Harry [name]  09/10/2022, 9:07 AM

## 2022-09-10 NOTE — Progress Notes (Signed)
Pt's daughter, [name], called requesting MD update on pt status. Berton Mount, MD notified.

## 2022-09-10 NOTE — Evaluation (Signed)
Modified Barium Swallow Study  Patient Details  Name: Harry [name] MRN: [id number] Date of Birth: [birth year]  Today's Date: 09/10/2022  Modified Barium Swallow completed.  Full report located under Chart Review in the Imaging Section.  History of Present Illness Harry [name] is a 77 y.o. male with medical history significant of Lewy body dementia, diabetes type 2, CKD 3A, anemia, hypertension, hyperlipidemia, syncope, peripheral arterial disease status post angioplasty and stenting, depression, glaucoma presenting with altered mental status. CTn Chest 09/08/22: IMPRESSION: 1. Bilateral lower lobe bronchial wall thickening with ground-glass attenuation and thickening of the peribronchovascular interstitium. Findings are concerning for bronchopneumonia. Bedside swallow eval 3/31 of this year - dysphagia 3/thins was recommended.   Clinical Impression Pt's swallowing was quite functional per today's MBS. Primary deficits were related to impaired oral manipulation and significant delay in the onset of the pharyngeal swallow. Material sat in pyriform sinuses until eventual swallow trigger. Laryngeal vestibule closure was effective, with only occasional, high penetration (penetration/aspiration scale score of 2, material enters larynx and then is ejected upon completion of the swallow).  There was no pharyngeal residue.  There was only one instance of mild aspiration, and that occurred when pt swallowed a pill with thin liquids - the liquids were aspirated, eliciting a strong cough response.  Otherwise, he protected his airway well. Will advance diet to dysphagia 2, thin liquids pending  f/u session tomorrow with SLP.  Pt MUST SIT UPRIGHT in bed when drinking.  If he is slouched or reclined, he tends to cough, which is a reliable indicator of aspiration in his case. Meds should be given whole in puree. Called his daughter, Harry [name], and we discussed results/recommendations. SLP will follow while  admitted. Factors that may increase risk of adverse event in presence of aspiration Rubye Oaks & Clearance Coots 2021): Limited mobility;Reduced cognitive function  Swallow Evaluation Recommendations Recommendations: PO diet PO Diet Recommendation: Dysphagia 1/honey thick - SLP will f/u next date to determine clinical performance with advanced diet.  Liquid Administration via: Cup Medication Administration: Whole meds with puree Supervision: Patient able to self-feed;Full assist for feeding Swallowing strategies  : Small bites/sips Postural changes: Position pt fully upright for meals Oral care recommendations: Oral care BID (2x/day) Caregiver Recommendations: Have oral suction available    Ciarra Braddy L. Samson Frederic, MA CCC/SLP Clinical Specialist - Acute Care SLP Acute Rehabilitation Services Office number [phone]   [name] 09/10/2022,2:25 PM

## 2022-09-10 NOTE — Plan of Care (Signed)

## 2022-09-10 NOTE — Care Management Important Message (Signed)
Important Message  Patient Details  Name: Harry [name] MRN: [id number] Date of Birth: 10/29/1945   Medicare Important Message Given:  Yes     Renie Ora 09/10/2022, 8:30 AM

## 2022-09-10 NOTE — Progress Notes (Signed)
[facility] received request from ID for TEE sometime this week. Originally put on schedule for tomorrow but per chart review patient was also found to have worsening of chronic anemia and heme positive stool so being seen by GI with question of needing EGD. Patient also undergoing MBS by SLP for concern for aspiration behaviors and dysphagia. Described as somnolent in last note. Does not seem he is yet appropriate to pursue TEE. D/w cardmaster - cards team will look to TEE end of this week instead based on clinical progress but will re-visit later this week for definitive recommendation.

## 2022-09-10 NOTE — Progress Notes (Signed)
Patient ID: Harry [name], male   DOB: Sep 15, 1945, 77 y.o.   MRN: [id number]    Progress Note   Subjective   Day # 5  CC; anemia, heme positive stool in setting of Lewy body dementia, chronic kidney disease, hypertension, peripheral vascular disease admitted 09/06/2022 with hypotension and altered mental status  Urine cultures growing multiple species, blood culture now growing 1/4 granulicatella adiacens  IV vancomycin IV cefepime IV Flagyl  Chest x-ray yesterday-interstitial opacities within the left hilar and left basilar region consider atelectasis asymmetric edema or infection  To have modified swallowing study per speech path today  Labs today-WBC 9.4/hemoglobin 8.8/27.2-stable   Objective   Vital signs in last 24 hours: Temp:  [98 F (36.7 C)-98.2 F (36.8 C)] 98 F (36.7 C) (07/08 0756) Pulse Rate:  [78-88] 78 (07/08 0756) Resp:  [14-19] 17 (07/08 0848) BP: (154-185)/(80-93) 171/84 (07/08 0848) SpO2:  [94 %-98 %] 95 % (07/08 0756) Weight:  [84.6 kg] 84.6 kg (07/08 0500) Last BM Date : 09/09/22 General:   Elderly African-American male in NAD, sleeping, did not arouse to voice Heart:  Regular rate and rhythm; no murmurs Lungs: Respirations even and unlabored, lungs CTA bilaterally Abdomen:  Soft, nontender and nondistended. Normal bowel sounds. Extremities:  Without edema. Neurologic: Somnolent   Intake/Output from previous day: 07/07 0701 - 07/08 0700 In: 360 [P.O.:360] Out: 3276 [Urine:3275; Stool:1] Intake/Output this shift: Total I/O In: 236 [P.O.:236] Out: 450 [Urine:450]  Lab Results: Recent Labs    09/08/22 0715 09/08/22 1808 09/10/22 0134  WBC 18.2* 16.1* 9.4  HGB 8.8* 8.5* 8.8*  HCT 28.3* 26.8* 27.2*  PLT 150 155 156   BMET Recent Labs    09/08/22 0715 09/09/22 0125 09/10/22 0134  NA 144 139 140  K 3.3* 3.2* 3.5  CL 109 113* 110  CO2 21* 20* 23  GLUCOSE 160* 235* 196*  BUN 28* 18 14  CREATININE 1.00 1.12 0.92  CALCIUM 7.6* 7.2*  7.6*   LFT Recent Labs    09/09/22 0125 09/10/22 0134  PROT 5.2*  --   ALBUMIN 2.2* 2.2*  AST 31  --   ALT 24  --   ALKPHOS 61  --   BILITOT 1.0  --    PT/INR No results for input(s): "LABPROT", "INR" in the last 72 hours.  Studies/Results: DG CHEST PORT 1 VIEW  Result Date: 09/09/2022 CLINICAL DATA:  Shortness of breath EXAM: PORTABLE CHEST 1 VIEW COMPARISON:  09/06/2022 FINDINGS: Heart size within normal limits. Interstitial opacities within the left perihilar and left basilar region. No pleural effusion or pneumothorax. IMPRESSION: Interstitial opacities within the left perihilar and left basilar region, which may represent atelectasis, asymmetric edema, or infection. Electronically Signed   By: Duanne Guess D.O.   On: 09/09/2022 16:33       Assessment / Plan:    #64 77 year old African-American male with advanced dementia, admitted on 09/06/2022 with hypotension and altered mental status, and found to have worsening of his chronic anemia and heme positive stool.  Hemoglobin very stable, no evidence for GI bleeding  Workup consistent with sepsis, he is being covered with broad-spectrum antibiotics Blood culture now showing 1 out of 4 positive granulicatella adiacens  #2 acute metabolic encephalopathy likely secondary to above #3 chronic kidney disease stage III #4 history of CVA with hemiaplasia-Plavix and aspirin on hold #5 peripheral arterial disease #6 insulin-dependent diabetes mellitus   Plan; can consider EGD prior to discharge, outpatient 1 tolerate a bowel prep for  colonoscopy, and no iron deficiency. GI will reassess later this week, please call in the interim for questions or problems          Principal Problem:   Acute encephalopathy Active Problems:   Type 2 diabetes mellitus with stage 3 chronic kidney disease, with long-term current use of insulin (HCC)   Essential hypertension   PAD (peripheral artery disease) (HCC)   S/P peripheral artery  angioplasty   Acute renal failure superimposed on stage 3a chronic kidney disease (HCC)   Lewy body dementia (HCC)   History of CVA (cerebrovascular accident)   Sepsis (HCC)   Normocytic anemia   Hyperlipidemia   Dementia without behavioral disturbance (HCC)   GI bleed   Heme positive stool   Acute on chronic anemia   Antiplatelet or antithrombotic long-term use   Anemia   Folate deficiency     LOS: 4 days   Macaulay Reicher PA-C 09/10/2022, 12:17 PM
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
## 2022-09-10 NOTE — Evaluation (Signed)
Occupational Therapy Evaluation Patient Details Name: Harry [name] MRN: [id number] DOB: 1945-05-11 Today's Date: 09/10/2022   History of Present Illness Pt is a 77 y.o. male presenting 09/06/22 with AMS. MRI showed no acute intracranial abnormality. Admitted with sepsis and acute encephalopathy from bronchopneumonia. PMH includes: HTN, HLD, DM II, CVA, PVD, CKD IIIA, syncope, glaucoma, and dementia.   Clinical Impression   Pt questionable historian, per chart review is dependent for transfers to w/c and has assist for ADLs, pt report can feed himself typically. Pt currently oriented to self only, needing mod -max A for ADLs and total A for bed mobility. Pt with L lateral lean (toward HOB) and posterior lean when sitting EOB, needing min A to remain upright. Pt able to sit EOB x5 min for UB dressing task. Pt presenting with impairments listed below, will follow acutely. Patient will benefit from continued inpatient follow up therapy, <3 hours/day to maximize safety/ind with ADLs/functional mobility.      Recommendations for follow up therapy are one component of a multi-disciplinary discharge planning process, led by the attending physician.  Recommendations may be updated based on patient status, additional functional criteria and insurance authorization.   Assistance Recommended at Discharge Frequent or constant Supervision/Assistance  Patient can return home with the following Two people to help with walking and/or transfers;A lot of help with bathing/dressing/bathroom;Assistance with cooking/housework;Direct supervision/assist for medications management;Direct supervision/assist for financial management;Assist for transportation;Help with stairs or ramp for entrance    Functional Status Assessment  Patient has had a recent decline in their functional status and demonstrates the ability to make significant improvements in function in a reasonable and predictable amount of time.  Equipment  Recommendations  Other (comment) (defer)    Recommendations for Other Services PT consult     Precautions / Restrictions Precautions Precautions: Fall Restrictions Weight Bearing Restrictions: No      Mobility Bed Mobility Overal bed mobility: Needs Assistance Bed Mobility: Supine to Sit, Sit to Supine     Supine to sit: Total assist, HOB elevated Sit to supine: Total assist, HOB elevated        Transfers                   General transfer comment: deferred      Balance Overall balance assessment: Needs assistance Sitting-balance support: Single extremity supported, Bilateral upper extremity supported, Feet supported Sitting balance-Leahy Scale: Poor Sitting balance - Comments: leans toward HOB (to L side)                                   ADL either performed or assessed with clinical judgement   ADL Overall ADL's : Needs assistance/impaired Eating/Feeding: Moderate assistance;Bed level   Grooming: Bed level;Minimal assistance   Upper Body Bathing: Maximal assistance;Sitting Upper Body Bathing Details (indicate cue type and reason): changed gowns seated EOB Lower Body Bathing: Maximal assistance   Upper Body Dressing : Maximal assistance   Lower Body Dressing: Maximal assistance   Toilet Transfer: Total assistance   Toileting- Clothing Manipulation and Hygiene: Total assistance       Functional mobility during ADLs: Maximal assistance;+2 for physical assistance       Vision   Additional Comments: per chart review pt with L eye blindness, incorrectly states numbers held up in front of him, pt with tightness in BLE's turned to L upon arrival, increased cues to turn head to R  Perception Perception Perception Tested?: No   Praxis Praxis Praxis tested?: Not tested    Pertinent Vitals/Pain Pain Assessment Pain Assessment: No/denies pain     Hand Dominance     Extremity/Trunk Assessment Upper Extremity  Assessment Upper Extremity Assessment: Generalized weakness   Lower Extremity Assessment Lower Extremity Assessment: Defer to PT evaluation       Communication Communication Communication: HOH   Cognition Arousal/Alertness: Awake/alert Behavior During Therapy: Flat affect Overall Cognitive Status: History of cognitive impairments - at baseline                                 General Comments: Hx of dementia, able to state name and DOB correctly, unaware of place/situation, states it is January     General Comments  VSS on RA    Exercises     Shoulder Instructions      Home Living Family/patient expects to be discharged to:: Skilled nursing facility                                 Additional Comments: Per chart, pt is in long term care at [facility] place.      Prior Functioning/Environment Prior Level of Function : Needs assist             Mobility Comments: Per chart, staff provided assist for transfers to/from w/c, unsure of technique though ADLs Comments: Per chart, pt can feed self but staff assists with other ADLs        OT Problem List: Decreased strength;Decreased range of motion;Decreased activity tolerance;Impaired balance (sitting and/or standing);Decreased cognition;Decreased safety awareness      OT Treatment/Interventions: Self-care/ADL training;Therapeutic exercise;Energy conservation;DME and/or AE instruction;Therapeutic activities;Balance training;Patient/family education;Cognitive remediation/compensation    OT Goals(Current goals can be found in the care plan section) Acute Rehab OT Goals Patient Stated Goal: none stated OT Goal Formulation: With patient Time For Goal Achievement: 09/24/22 Potential to Achieve Goals: Good ADL Goals Pt Will Perform Eating: with min guard assist;bed level Pt Will Perform Grooming: with min guard assist;sitting;bed level Pt Will Perform Upper Body Dressing: with mod assist;sitting Pt  Will Perform Lower Body Dressing: with mod assist;sit to/from stand;sitting/lateral leans Pt Will Transfer to Toilet: with mod assist;squat pivot transfer;stand pivot transfer;bedside commode Additional ADL Goal #1: pt will be mod A for bed mobility in prep for ADLs  OT Frequency: Min 1X/week    Co-evaluation              AM-PAC OT "6 Clicks" Daily Activity     Outcome Measure Help from another person eating meals?: A Lot Help from another person taking care of personal grooming?: A Little Help from another person toileting, which includes using toliet, bedpan, or urinal?: Total Help from another person bathing (including washing, rinsing, drying)?: A Lot Help from another person to put on and taking off regular upper body clothing?: A Lot Help from another person to put on and taking off regular lower body clothing?: A Lot 6 Click Score: 12   End of Session Nurse Communication: Mobility status  Activity Tolerance: Patient tolerated treatment well Patient left: in bed;with bed alarm set;with call bell/phone within reach;with nursing/sitter in room  OT Visit Diagnosis: Unsteadiness on feet (R26.81);Other abnormalities of gait and mobility (R26.89);Muscle weakness (generalized) (M62.81)  Time: 6295-2841 OT Time Calculation (min): 21 min Charges:  OT General Charges $OT Visit: 1 Visit OT Evaluation $OT Eval Moderate Complexity: 1 Mod  Johnnette Laux K, OTD, OTR/L [employer] Preferred Acute Rehab [phone]   Carver Fila Koonce 09/10/2022, 2:29 PM

## 2022-09-10 NOTE — Progress Notes (Signed)
[facility]                                                                               Harry [name], is a 77 y.o. male, DOB - 24-Dec-1945, ZOX:[id number] Admit date - 09/06/2022    Outpatient Primary MD for the patient is System, Provider Not In  LOS - 4  days    Brief summary  Patient is a 77 year old male with history of fairly advanced dementia (query type.  Query Lewy body dementia versus vascular dementia versus Alzheimer's dementia). Other medical history includes chronic kidney disease stage IIIa, diabetes mellitus, anemia, hypertension, hyperlipidemia and syncope about peripheral vascular disease s/p angioplasty and stenting, depression and glaucoma.  Patient was admitted with altered mental status, volume depletion, acute kidney injury and likely UTI.  Urine cultures growing multiple.  The species.  MRI and CT head negative for acute events.  09/08/2022: Patient seen alongside patient's daughter.  Swallowing evaluation is also ongoing currently.  Altered mentation has improved.  Blood pressure has improved significantly.  Serum creatinine has improved from 2.45-1.  Potassium of 3.3 is noted.  Leukocytosis persists.  Hemoglobin is about stable.  09/09/2022: Patient seen.  Patient remains ill looking.  Blood pressure has been elevated (172-189/88 mmHg).  Potassium of 3.2 is noted.  Albumin is also 2.2.  Urine culture grew multiple species.  Blood culture has not grown any Intertrochanteric.  09/10/2022: Blood cultures growing granulicatella adiacens.  Based on the organisms, workup for possible infective endocarditis is in progress.  TEE is planned.  Urine culture is growing multiple organisms.  Antibiotics has been narrowed down to vancomycin, based on infectious disease advice.  Phosphorus of 1.5 and magnesium of 1.5 noted.  Will replete.    Assessment & Plan  Severe sepsis with septic shock:  -Likely sepsis secondary to UTI. -Chest x-ray looks good. -Hypotension has  resolved. -Continue IV vancomycin, cefepime and Flagyl. -On admission, patient was hypotensive, with  leukocytosis, tachypnea, elevated lactic acid on admission.  -Urine culture grew multiple organisms.  Will repeat urine culture.  -Continue to monitor renal function and electrolytes. 09/09/2022: Sepsis physiology has resolved.  Continue IV antibiotics. 09/10/2022: See above documentation.  Blood cultures growing granulicatella adiacens.  Urine cultures growing multiple organisms.  Antibiotics narrowed down to IV vancomycin as per infectious disease.  Patient seems to be improving.  Acute encephalopathy, multifactorial, likely combined toxic and metabolic: -Acute UTI, volume depletion, acute kidney injury on presentation. -Urine cultures growing multiple organisms. -Encephalopathy is resolving. -Address and treat any other underlying problems. 09/09/2022: Continues to resolve. 09/10/2022: Generally, some improvement is noted.   Acute versus chronic anemia of unclear etiology.  -Hemoglobin is above baseline. -Continue to monitor H/H.   -GI input is appreciated.  No EGD planned for now.  Acute on stage 3 a CKD versus acute kidney injury:  Baseline creatinine is around 1.5, admitted with a creatinine of 2. 45,  Gently hydrate and repeat renal parameters in am.  09/09/2022: Improved significantly.  IV fluid be discontinued. 09/10/2022: Acute kidney injury has resolved.  Insulin dependent DM;  Continue with SSI.   H/o CVA with hemiplegia:  Was on aspirin, plavix and  statin.  DAPT on hold for anemia.   Hypertension;  Episodes of hypotension resolved on admission.  Currently BP parameters are optimal.   H/O PAD On DAPT and statin , which were on hold due to FOBT positive and anemia.  Continue with statin for now.    Estimated body mass index is 28.36 kg/m as calculated from the following:   Height as of this encounter: 5\' 8"  (1.727 m).   Weight as of this encounter: 84.6 kg.  Code  Status: full code.  DVT Prophylaxis:  SCDs Start: 09/06/22 1341   Level of Care: Level of care: Progressive Family Communication: none at bedside.   Disposition Plan:     Remains inpatient appropriate:  IV antibiotics, anemia work up.  AMS.   Procedures:  None.   Consultants:   Gastroenterology.   Antimicrobials:  IV vancomycin  Anti-infectives (From admission, onward)    Start     Dose/Rate Route Frequency Ordered Stop   09/10/22 1200  vancomycin (VANCOREADY) IVPB 1750 mg/350 mL        1,750 mg 175 mL/hr over 120 Minutes Intravenous Every 24 hours 09/10/22 0953     09/10/22 1200  ceFEPIme (MAXIPIME) 2 g in sodium chloride 0.9 % 100 mL IVPB  Status:  Discontinued        2 g 200 mL/hr over 30 Minutes Intravenous Every 8 hours 09/10/22 0953 09/10/22 1027   09/08/22 1315  ceFEPIme (MAXIPIME) 2 g in sodium chloride 0.9 % 100 mL IVPB  Status:  Discontinued        2 g 200 mL/hr over 30 Minutes Intravenous Every 12 hours 09/08/22 1304 09/10/22 0953   09/08/22 1300  vancomycin (VANCOREADY) IVPB 1500 mg/300 mL  Status:  Discontinued        1,500 mg 150 mL/hr over 120 Minutes Intravenous Every 24 hours 09/08/22 1212 09/10/22 0953   09/07/22 1400  ceFEPIme (MAXIPIME) 2 g in sodium chloride 0.9 % 100 mL IVPB  Status:  Discontinued        2 g 200 mL/hr over 30 Minutes Intravenous Every 24 hours 09/06/22 1250 09/08/22 1304   09/07/22 1230  vancomycin (VANCOREADY) IVPB 750 mg/150 mL  Status:  Discontinued        750 mg 150 mL/hr over 60 Minutes Intravenous Every 24 hours 09/07/22 1139 09/08/22 1212   09/06/22 2200  metroNIDAZOLE (FLAGYL) IVPB 500 mg  Status:  Discontinued        500 mg 100 mL/hr over 60 Minutes Intravenous Every 12 hours 09/06/22 1344 09/10/22 1027   09/06/22 1250  vancomycin variable dose per unstable renal function (pharmacist dosing)  Status:  Discontinued         Does not apply See admin instructions 09/06/22 1250 09/07/22 1159   09/06/22 1015  metroNIDAZOLE  (FLAGYL) IVPB 500 mg        500 mg 100 mL/hr over 60 Minutes Intravenous  Once 09/06/22 1002 09/06/22 1216   09/06/22 1015  ceFEPIme (MAXIPIME) 2 g in sodium chloride 0.9 % 100 mL IVPB        2 g 200 mL/hr over 30 Minutes Intravenous  Once 09/06/22 1010 09/06/22 1133   09/06/22 1015  vancomycin (VANCOREADY) IVPB 1500 mg/300 mL        1,500 mg 150 mL/hr over 120 Minutes Intravenous  Once 09/06/22 1010 09/06/22 1436        Medications  Scheduled Meds:  carvedilol  12.5 mg Oral BID WC   Chlorhexidine Gluconate Cloth  6 each Topical Daily   [START ON 09/11/2022] folic acid  1 mg Oral Daily   insulin aspart  0-5 Units Subcutaneous QHS   insulin aspart  0-9 Units Subcutaneous TID WC   pantoprazole (PROTONIX) IV  40 mg Intravenous Q12H   pyridostigmine  30 mg Oral Q8H   rosuvastatin  5 mg Oral QHS   sertraline  25 mg Oral Daily   sodium chloride flush  3 mL Intravenous Q12H   tamsulosin  0.4 mg Oral Daily   Continuous Infusions:  potassium PHOSPHATE IVPB (in mmol) 30 mmol (09/10/22 1501)   vancomycin 1,750 mg (09/10/22 1239)   PRN Meds:.acetaminophen **OR** acetaminophen, hydrALAZINE, polyethylene glycol    Subjective:   Harry [name] was seen and examined today. No significant history from patient.    Objective:   Vitals:   09/10/22 0848 09/10/22 1216 09/10/22 1556 09/10/22 1627  BP: (!) 171/84 (!) 160/74  (!) 164/77  Pulse:  77 84   Resp: 17 20 16    Temp:  98.5 F (36.9 C) 97.8 F (36.6 C)   TempSrc:  Axillary Axillary   SpO2:  97% 97%   Weight:      Height:        Intake/Output Summary (Last 24 hours) at 09/10/2022 1829 Last data filed at 09/10/2022 1559 Gross per 24 hour  Intake 804 ml  Output 2701 ml  Net -1897 ml    Filed Weights   09/08/22 0500 09/09/22 0615 09/10/22 0500  Weight: 79.9 kg 85.1 kg 84.6 kg     Exam General exam: Chronically ill looking.  Not in any distress.   Neck: Supple.     Lungs: Clear to auscultation. CVS: S1-S2. Neurology  ill appearing elderly gentleman, not in distress.  Gastrointestinal system: Abdomen is soft and nontender.   Data Reviewed:  I have personally reviewed following labs and imaging studies   CBC Lab Results  Component Value Date   WBC 9.4 09/10/2022   RBC 2.84 (L) 09/10/2022   HGB 8.8 (L) 09/10/2022   HCT 27.2 (L) 09/10/2022   MCV 95.8 09/10/2022   MCH 31.0 09/10/2022   PLT 156 09/10/2022   MCHC 32.4 09/10/2022   RDW 18.2 (H) 09/10/2022   LYMPHSABS 1.4 09/10/2022   MONOABS 1.1 (H) 09/10/2022   EOSABS 0.3 09/10/2022   BASOSABS 0.0 09/10/2022     Last metabolic panel Lab Results  Component Value Date   NA 140 09/10/2022   K 3.5 09/10/2022   CL 110 09/10/2022   CO2 23 09/10/2022   BUN 14 09/10/2022   CREATININE 0.92 09/10/2022   GLUCOSE 196 (H) 09/10/2022   GFRNONAA >60 09/10/2022   GFRAA 51 (L) 10/30/2019   CALCIUM 7.6 (L) 09/10/2022   PHOS 1.5 (L) 09/10/2022   PROT 5.2 (L) 09/09/2022   ALBUMIN 2.2 (L) 09/10/2022   LABGLOB 3.2 11/11/2015   AGRATIO 1.3 11/11/2015   BILITOT 1.0 09/09/2022   ALKPHOS 61 09/09/2022   AST 31 09/09/2022   ALT 24 09/09/2022   ANIONGAP 7 09/10/2022    CBG (last 3)  Recent Labs    09/10/22 1220 09/10/22 1554 09/10/22 1815  GLUCAP 189* 269* 292*       Coagulation Profile: Recent Labs  Lab 09/06/22 1000  INR 1.3*      Radiology Studies: DG CHEST PORT 1 VIEW  Result Date: 09/09/2022 CLINICAL DATA:  Shortness of breath EXAM: PORTABLE CHEST 1 VIEW COMPARISON:  09/06/2022 FINDINGS: Heart size within normal limits. Interstitial opacities within  the left perihilar and left basilar region. No pleural effusion or pneumothorax. IMPRESSION: Interstitial opacities within the left perihilar and left basilar region, which may represent atelectasis, asymmetric edema, or infection. Electronically Signed   By: Duanne Guess D.O.   On: 09/09/2022 16:33    Time spent: 55 minutes.   Barnetta Chapel M.D. [facility] 09/10/2022,  6:29 PM  Available via Epic secure chat 7am-7pm After 7 pm, please refer to night coverage provider listed on amion.

## 2022-09-11 ENCOUNTER — Inpatient Hospital Stay (HOSPITAL_COMMUNITY): Payer: Medicare Other

## 2022-09-11 DIAGNOSIS — R7881 Bacteremia: Secondary | ICD-10-CM

## 2022-09-11 DIAGNOSIS — F0390 Unspecified dementia without behavioral disturbance: Secondary | ICD-10-CM | POA: Diagnosis not present

## 2022-09-11 DIAGNOSIS — G9340 Encephalopathy, unspecified: Secondary | ICD-10-CM | POA: Diagnosis not present

## 2022-09-11 DIAGNOSIS — Z452 Encounter for adjustment and management of vascular access device: Secondary | ICD-10-CM | POA: Diagnosis not present

## 2022-09-11 DIAGNOSIS — B954 Other streptococcus as the cause of diseases classified elsewhere: Secondary | ICD-10-CM | POA: Diagnosis not present

## 2022-09-11 LAB — CBC WITH DIFFERENTIAL/PLATELET
Abs Immature Granulocytes: 0.07 10*3/uL (ref 0.00–0.07)
Basophils Absolute: 0 10*3/uL (ref 0.0–0.1)
Basophils Relative: 1 %
Eosinophils Absolute: 0.3 10*3/uL (ref 0.0–0.5)
Eosinophils Relative: 6 %
HCT: 25.9 % — ABNORMAL LOW (ref 39.0–52.0)
Hemoglobin: 8.2 g/dL — ABNORMAL LOW (ref 13.0–17.0)
Immature Granulocytes: 1 %
Lymphocytes Relative: 25 %
Lymphs Abs: 1.4 10*3/uL (ref 0.7–4.0)
MCH: 29.3 pg (ref 26.0–34.0)
MCHC: 31.7 g/dL (ref 30.0–36.0)
MCV: 92.5 fL (ref 80.0–100.0)
Monocytes Absolute: 1 10*3/uL (ref 0.1–1.0)
Monocytes Relative: 19 %
Neutro Abs: 2.5 10*3/uL (ref 1.7–7.7)
Neutrophils Relative %: 48 %
Platelets: 168 10*3/uL (ref 150–400)
RBC: 2.8 MIL/uL — ABNORMAL LOW (ref 4.22–5.81)
RDW: 17.3 % — ABNORMAL HIGH (ref 11.5–15.5)
WBC: 5.3 10*3/uL (ref 4.0–10.5)
nRBC: 0 % (ref 0.0–0.2)

## 2022-09-11 LAB — ECHOCARDIOGRAM COMPLETE
AR max vel: 2.43 cm2
AV Area VTI: 2.68 cm2
AV Area mean vel: 2.66 cm2
AV Mean grad: 3 mmHg
AV Peak grad: 5.2 mmHg
Ao pk vel: 1.14 m/s
Area-P 1/2: 3.77 cm2
Calc EF: 64 %
Height: 68 in
S' Lateral: 2.6 cm
Single Plane A2C EF: 68.9 %
Single Plane A4C EF: 61.2 %
Weight: 2987.67 oz

## 2022-09-11 LAB — CULTURE, OB URINE: Culture: NO GROWTH

## 2022-09-11 LAB — RENAL FUNCTION PANEL
Albumin: 2.2 g/dL — ABNORMAL LOW (ref 3.5–5.0)
Anion gap: 8 (ref 5–15)
BUN: 18 mg/dL (ref 8–23)
CO2: 21 mmol/L — ABNORMAL LOW (ref 22–32)
Calcium: 7.7 mg/dL — ABNORMAL LOW (ref 8.9–10.3)
Chloride: 110 mmol/L (ref 98–111)
Creatinine, Ser: 1 mg/dL (ref 0.61–1.24)
GFR, Estimated: >60 mL/min (ref >60)
Glucose, Bld: 258 mg/dL — ABNORMAL HIGH (ref 70–99)
Phosphorus: 3.2 mg/dL (ref 2.5–4.6)
Potassium: 3.5 mmol/L (ref 3.5–5.1)
Sodium: 139 mmol/L (ref 135–145)

## 2022-09-11 LAB — GLUCOSE, CAPILLARY
Glucose-Capillary: 160 mg/dL — ABNORMAL HIGH (ref 70–99)
Glucose-Capillary: 119 mg/dL — ABNORMAL HIGH (ref 70–99)
Glucose-Capillary: 117 mg/dL — ABNORMAL HIGH (ref 70–99)
Glucose-Capillary: 240 mg/dL — ABNORMAL HIGH (ref 70–99)

## 2022-09-11 LAB — CULTURE, BLOOD (ROUTINE X 2): Special Requests: ADEQUATE

## 2022-09-11 LAB — MAGNESIUM: Magnesium: 2 mg/dL (ref 1.7–2.4)

## 2022-09-11 MED ORDER — PANTOPRAZOLE SODIUM 40 MG PO TBEC
40.0000 mg | DELAYED_RELEASE_TABLET | Freq: Two times a day (BID) | ORAL | Status: DC
  Administered 2022-09-11 – 2022-09-12 (×3): 40 mg via ORAL
  Filled 2022-09-11 (×4): qty 1

## 2022-09-11 MED ORDER — FOOD THICKENER (SIMPLYTHICK): 10.0000 | ORAL | Status: DC | PRN

## 2022-09-11 NOTE — Progress Notes (Signed)
Speech Language Pathology Treatment: Dysphagia  Patient Details Name: Harry [name] MRN: [id number] DOB: 07/15/1945 Today's Date: 09/11/2022 Time: 7829-5621 SLP Time Calculation (min) (ACUTE ONLY): 20 min  Assessment / Plan / Recommendation Clinical Impression  Harry [name] was positioned in bed to optimize his participation and safety with PO. He accepted softened graham crackers and sips of water with assist to hold cup in RUE.  Thin liquids, when consumed in conjunction with solids, and despite upright position and single sips, elicited explosive coughing, concerning for aspiration.  When he drank single sips of thin liquid today in isolation (without food), he did not cough.  Trials of nectar thick with food also did not elicit cough.  Per Harry [name], his daughter, he ate/drank well at [facility] when he was able to sit upright at a chair in the dining room.  He tended to cough more when eating in bed.  While he remains admitted, recommend advancing diet to dysphagia 2/nectar thick liquids. Position him in bed as high as it will allow.  Between meals he may have small sips of thin liquid.    SLP will follow.   HPI HPI: Harry [name] is a 77 y.o. male with medical history significant of Lewy body dementia, diabetes type 2, CKD 3A, anemia, hypertension, hyperlipidemia, syncope, peripheral arterial disease status post angioplasty and stenting, depression, glaucoma presenting with altered mental status. CTn Chest 09/08/22: IMPRESSION: 1. Bilateral lower lobe bronchial wall thickening with ground-glass attenuation and thickening of the peribronchovascular interstitium. Findings are concerning for bronchopneumonia. Bedside swallow eval 3/31 of this year - dysphagia 3/thins was recommended.      SLP Plan  Continue with current plan of care      Recommendations for follow up therapy are one component of a multi-disciplinary discharge planning process, led by the attending physician.  Recommendations may be  updated based on patient status, additional functional criteria and insurance authorization.    Recommendations  Diet recommendations: Dysphagia 2 (fine chop);Nectar-thick liquid Liquids provided via: Cup;Straw Medication Administration: Whole meds with puree Supervision: Full supervision/cueing for compensatory strategies Compensations: Minimize environmental distractions;Other (Comment) (single sips) Postural Changes and/or Swallow Maneuvers: Seated upright 90 degrees                  Oral care BID   Frequent or constant Supervision/Assistance Dysphagia, oropharyngeal phase (R13.12)     Continue with current plan of care    Harry [name] L. Harry Frederic, MA CCC/SLP Clinical Specialist - Acute Care SLP Acute Rehabilitation Services Office number [phone]  Harry [name] Harry [name]  09/11/2022, 3:28 PM

## 2022-09-11 NOTE — Progress Notes (Signed)
Patient ID: Harry [name], male   DOB: March 01, 1946, 77 y.o.   MRN: [id number]    Progress Note   Subjective   Day # 6 CC; anemia, heme +, admitted with hypotension and altered mental status found secondary to sepsis  Advanced dementia  Blood cultures positive Granulicatella adiacens  2D echo today-no evidence of vegetation  Patient lying in the bed, eyes open, no verbal response, no evidence of any active GI bleeding  Labs today-WBC 5.3/hemoglobin 8.2/hematocrit/25.9 Potassium 3.5/glucose 258/BUN 18/creatinine 1.0    Objective   Vital signs in last 24 hours: Temp:  [97.8 F (36.6 C)-98.9 F (37.2 C)] 97.9 F (36.6 C) (07/09 1140) Pulse Rate:  [71-84] 71 (07/09 1140) Resp:  [12-19] 12 (07/09 1140) BP: (152-181)/(60-77) 152/60 (07/09 1140) SpO2:  [94 %-100 %] 97 % (07/09 1140) Weight:  [84.7 kg] 84.7 kg (07/09 0500) Last BM Date : 09/10/22 General:    Elderly African-American male in NAD lying in bed, eyes open no verbal response Heart:  Regular rate and rhythm; no murmurs Lungs: Respirations even and unlabored, lungs CTA bilaterally Abdomen:  Soft, nontender and nondistended. Normal bowel sounds. Extremities:  Without edema. Neurologic:  Alert    Intake/Output from previous day: 07/08 0701 - 07/09 0700 In: 1314 [P.O.:354; IV Piggyback:960] Out: 1550 [Urine:1550] Intake/Output this shift: Total I/O In: 80 [P.O.:80] Out: -   Lab Results: Recent Labs    09/08/22 1808 09/10/22 0134 09/11/22 0109  WBC 16.1* 9.4 5.3  HGB 8.5* 8.8* 8.2*  HCT 26.8* 27.2* 25.9*  PLT 155 156 168   BMET Recent Labs    09/09/22 0125 09/10/22 0134 09/11/22 0109  NA 139 140 139  K 3.2* 3.5 3.5  CL 113* 110 110  CO2 20* 23 21*  GLUCOSE 235* 196* 258*  BUN 18 14 18   CREATININE 1.12 0.92 1.00  CALCIUM 7.2* 7.6* 7.7*   LFT Recent Labs    09/09/22 0125 09/10/22 0134 09/11/22 0109  PROT 5.2*  --   --   ALBUMIN 2.2*   < > 2.2*  AST 31  --   --   ALT 24  --   --    ALKPHOS 61  --   --   BILITOT 1.0  --   --    < > = values in this interval not displayed.   PT/INR No results for input(s): "LABPROT", "INR" in the last 72 hours.  Studies/Results: ECHOCARDIOGRAM COMPLETE  Result Date: 09/11/2022    ECHOCARDIOGRAM REPORT   Patient Name:   Harry [name] Date of Exam: 09/11/2022 Medical Rec #:  [id number]        Height:       68.0 in Accession #:    [id number]       Weight:       186.7 lb Date of Birth:  March 20, 1945         BSA:          1.985 m Patient Age:    76 years         BP:           106/63 mmHg Patient Gender: M                HR:           71 bpm. Exam Location:  Inpatient Procedure: 2D Echo, Cardiac Doppler, Color Doppler and Strain Analysis Indications:    Bacteremia R78.81  History:        Patient has prior  history of Echocardiogram examinations, most                 recent 06/03/2022. Stroke and AMS, Anemia, CKD,                 Signs/Symptoms:Hypotension; Risk Factors:Hypertension, Diabetes                 and Dyslipidemia.  Sonographer:    Jake Seats RDMS, RVT, RDCS Referring Phys: [id number] TRUNG T VU IMPRESSIONS  1. Left ventricular ejection fraction, by estimation, is 60 to 65%. The left ventricle has normal function. The left ventricle has no regional wall motion abnormalities. There is mild concentric left ventricular hypertrophy. Left ventricular diastolic parameters are consistent with Grade I diastolic dysfunction (impaired relaxation). Elevated left atrial pressure. The average left ventricular global longitudinal strain is -19.0 %. The global longitudinal strain is normal.  2. Right ventricular systolic function is normal. The right ventricular size is normal.  3. The mitral valve is normal in structure. No evidence of mitral valve regurgitation. No evidence of mitral stenosis.  4. The aortic valve is tricuspid. There is mild calcification of the aortic valve. There is mild thickening of the aortic valve. Aortic valve regurgitation is not  visualized. Aortic valve sclerosis/calcification is present, without any evidence of aortic stenosis.  5. The inferior vena cava is normal in size with greater than 50% respiratory variability, suggesting right atrial pressure of 3 mmHg. Comparison(s): Prior images reviewed side by side. The pericardial effusion is no longer present. Conclusion(s)/Recommendation(s): No evidence of valvular vegetations on this transthoracic echocardiogram. Consider a transesophageal echocardiogram to exclude infective endocarditis if clinically indicated. FINDINGS  Left Ventricle: Left ventricular ejection fraction, by estimation, is 60 to 65%. The left ventricle has normal function. The left ventricle has no regional wall motion abnormalities. The average left ventricular global longitudinal strain is -19.0 %. The global longitudinal strain is normal. The left ventricular internal cavity size was normal in size. There is mild concentric left ventricular hypertrophy. Left ventricular diastolic parameters are consistent with Grade I diastolic dysfunction (impaired relaxation). Elevated left atrial pressure. Right Ventricle: The right ventricular size is normal. No increase in right ventricular wall thickness. Right ventricular systolic function is normal. Left Atrium: Left atrial size was normal in size. Right Atrium: Right atrial size was normal in size. Pericardium: There is no evidence of pericardial effusion. Mitral Valve: The mitral valve is normal in structure. No evidence of mitral valve regurgitation. No evidence of mitral valve stenosis. Tricuspid Valve: The tricuspid valve is normal in structure. Tricuspid valve regurgitation is not demonstrated. No evidence of tricuspid stenosis. Aortic Valve: The aortic valve is tricuspid. There is mild calcification of the aortic valve. There is mild thickening of the aortic valve. Aortic valve regurgitation is not visualized. Aortic valve sclerosis/calcification is present, without any  evidence of aortic stenosis. Aortic valve mean gradient measures 3.0 mmHg. Aortic valve peak gradient measures 5.2 mmHg. Aortic valve area, by VTI measures 2.68 cm. Pulmonic Valve: The pulmonic valve was normal in structure. Pulmonic valve regurgitation is not visualized. No evidence of pulmonic stenosis. Aorta: The aortic root is normal in size and structure. Venous: The inferior vena cava is normal in size with greater than 50% respiratory variability, suggesting right atrial pressure of 3 mmHg. IAS/Shunts: No atrial level shunt detected by color flow Doppler.  LEFT VENTRICLE PLAX 2D LVIDd:         4.40 cm     Diastology LVIDs:  2.60 cm     LV e' medial:    4.57 cm/s LV PW:         1.30 cm     LV E/e' medial:  18.6 LV IVS:        1.30 cm     LV e' lateral:   4.68 cm/s LVOT diam:     1.90 cm     LV E/e' lateral: 18.2 LV SV:         59 LV SV Index:   30          2D Longitudinal Strain LVOT Area:     2.84 cm    2D Strain GLS (A2C):   -19.7 %                            2D Strain GLS (A3C):   -19.1 %                            2D Strain GLS (A4C):   -18.2 % LV Volumes (MOD)           2D Strain GLS Avg:     -19.0 % LV vol d, MOD A2C: 65.3 ml LV vol d, MOD A4C: 50.3 ml LV vol s, MOD A2C: 20.3 ml LV vol s, MOD A4C: 19.5 ml LV SV MOD A2C:     45.0 ml LV SV MOD A4C:     50.3 ml LV SV MOD BP:      36.8 ml RIGHT VENTRICLE RV S prime:     12.10 cm/s TAPSE (M-mode): 2.1 cm LEFT ATRIUM             Index        RIGHT ATRIUM           Index LA diam:        3.50 cm 1.76 cm/m   RA Area:     10.10 cm LA Vol (A2C):   57.4 ml 28.92 ml/m  RA Volume:   17.20 ml  8.67 ml/m LA Vol (A4C):   43.4 ml 21.86 ml/m LA Biplane Vol: 54.6 ml 27.51 ml/m  AORTIC VALVE                    PULMONIC VALVE AV Area (Vmax):    2.43 cm     PR End Diast Vel: 4.16 msec AV Area (Vmean):   2.66 cm AV Area (VTI):     2.68 cm AV Vmax:           114.00 cm/s AV Vmean:          73.500 cm/s AV VTI:            0.221 m AV Peak Grad:      5.2 mmHg AV  Mean Grad:      3.0 mmHg LVOT Vmax:         97.90 cm/s LVOT Vmean:        68.900 cm/s LVOT VTI:          0.209 m LVOT/AV VTI ratio: 0.95  AORTA Ao Root diam: 3.80 cm Ao Asc diam:  3.60 cm MITRAL VALVE                TRICUSPID VALVE MV Area (PHT): 3.77 cm     TR Peak grad:   4.2 mmHg MV Decel Time: 201 msec     TR  Vmax:        102.00 cm/s MV E velocity: 85.10 cm/s MV A velocity: 107.00 cm/s  SHUNTS MV E/A ratio:  0.80         Systemic VTI:  0.21 m                             Systemic Diam: 1.90 cm Rachelle Hora Croitoru MD Electronically signed by Thurmon Fair MD Signature Date/Time: 09/11/2022/1:35:48 PM    Final    DG CHEST PORT 1 VIEW  Result Date: 09/09/2022 CLINICAL DATA:  Shortness of breath EXAM: PORTABLE CHEST 1 VIEW COMPARISON:  09/06/2022 FINDINGS: Heart size within normal limits. Interstitial opacities within the left perihilar and left basilar region. No pleural effusion or pneumothorax. IMPRESSION: Interstitial opacities within the left perihilar and left basilar region, which may represent atelectasis, asymmetric edema, or infection. Electronically Signed   By: Duanne Guess D.O.   On: 09/09/2022 16:33       Assessment / Plan:    #31 77 year old male with advanced Lewy body dementia, admitted with altered mental status and hypotension found secondary to sepsis Atypical organism as above, ID following  2D echo negative for evidence of vegetation, TEE being considered   #2 anemia, normocytic, heme positive-hemoglobin overall stable no evidence of active bleeding  #3 acute metabolic encephalopathy secondary to above #4 history of CVA had been on Plavix and aspirin-on hold #5 insulin-dependent diabetes mellitus #6.  Peripheral arterial disease #7.  Chronic kidney disease stage III  Plan;-hesitant to proceed with EGD, given his multiple comorbidities and lack of active GI bleeding Cardiology to decide regarding TEE- If EGD is needed prior to TEE then this can be done, will need to discuss  further with family is at increased risk for complications with sedation. If no TEE and plan is for empiric treatment for endocarditis with 6 weeks of IV vancomycin, then would not plan to proceed with EGD.     Principal Problem:   Acute encephalopathy Active Problems:   Type 2 diabetes mellitus with stage 3 chronic kidney disease, with long-term current use of insulin (HCC)   Essential hypertension   PAD (peripheral artery disease) (HCC)   S/P peripheral artery angioplasty   Acute renal failure superimposed on stage 3a chronic kidney disease (HCC)   Lewy body dementia (HCC)   History of CVA (cerebrovascular accident)   Sepsis (HCC)   Normocytic anemia   Hyperlipidemia   Dementia (HCC)   GI bleed   Heme positive stool   Acute on chronic anemia   Antiplatelet or antithrombotic long-term use   Anemia   Folate deficiency   Bacteremia     LOS: 5 days   Sritha Chauncey EsterwoodPA-C  09/11/2022, 3:13 PM

## 2022-09-11 NOTE — Progress Notes (Signed)
PT Cancellation Note  Patient Details Name: Harry [name] MRN: [id number] DOB: June 21, 1945   Cancelled Treatment:    Reason Eval/Treat Not Completed: (P) Fatigue/lethargy limiting ability to participate (Pt appearing lethargic upon entry and refusing mobility when awoken. Will continue to follow per PT POC.)   Johny Shock 09/11/2022, 10:17 AM

## 2022-09-11 NOTE — Progress Notes (Signed)
[facility]  Date of Admission:  09/06/2022     CC: AMS - now positive for granulicatella adiacens bacteremia  Lines: Urethral catheter PIV: L and R forearms  Antibiotics: Vancomycin 1750 mg IV q24h (07/04-present) Previously: cefepime 07/04-07/08 and metronidazole 07/04-07/08  Assessment: Granulicatella adiacens bacteremia: Blood culture from 07/04 with 1/4 bottles positive for Granulicatella adiacens. Repeat blood culture collected 07/08 shows no growth at 24h. Remains without leukocytosis or fever, HDS with hypertension. Narrowed to IV vancomycin yesterday. Complicated case given AMS, concern for GI bleed and possible dysphagia as these are relative contraindications to TEE/further sedation however in discussion with our GI team, they are amenable to completing an EGD if it would help facilitate the TEE. Discussion is ongoing with cardiology, GI, primary, and ourselves regarding plan moving forwards. Repeat urine culture is in process.  2. Altered mental status: Unchanged on exam from yesterday. 3. C/f GIB: Hgb overall stable. GI consulted by primary team, working closely as above. 4. Hx dementia   Plan: Continue vancomycin. F/u TTE F/u ongoing recommendations re: EGD, TEE  Follow repeat blood cultures, urine culture.   Principal Problem:   Acute encephalopathy Active Problems:   Type 2 diabetes mellitus with stage 3 chronic kidney disease, with long-term current use of insulin (HCC)   Essential hypertension   PAD (peripheral artery disease) (HCC)   S/P peripheral artery angioplasty   Acute renal failure superimposed on stage 3a chronic kidney disease (HCC)   Lewy body dementia (HCC)   History of CVA (cerebrovascular accident)   Sepsis (HCC)   Normocytic anemia   Hyperlipidemia   Dementia (HCC)   GI bleed   Heme positive stool   Acute on chronic anemia   Antiplatelet or antithrombotic long-term use   Anemia   Folate deficiency    Bacteremia   Allergies  Allergen Reactions   Lipitor [Atorvastatin] Other (See Comments)    Myalgias   Statins Other (See Comments)    Myalgias  Pt tolerating rosuvastatin 5mg  QD   Pravachol [Pravastatin] Rash    Scheduled Meds:  carvedilol  12.5 mg Oral BID WC   Chlorhexidine Gluconate Cloth  6 each Topical Daily   folic acid  1 mg Oral Daily   insulin aspart  0-5 Units Subcutaneous QHS   insulin aspart  0-9 Units Subcutaneous TID WC   pantoprazole (PROTONIX) IV  40 mg Intravenous Q12H   pyridostigmine  30 mg Oral Q8H   rosuvastatin  5 mg Oral QHS   sertraline  25 mg Oral Daily   sodium chloride flush  3 mL Intravenous Q12H   tamsulosin  0.4 mg Oral Daily   Continuous Infusions:  vancomycin Stopped (09/10/22 1500)   PRN Meds:.acetaminophen **OR** acetaminophen, hydrALAZINE, polyethylene glycol   SUBJECTIVE: Harry [name].   OBJECTIVE: Vitals:   09/10/22 2302 09/11/22 0317 09/11/22 0500 09/11/22 0708  BP: (!) 159/63 (!) 153/75  (!) 164/70  Pulse: 74 76  73  Resp: 14 19  13   Temp: 98.1 F (36.7 C) 98.9 F (37.2 C)  97.8 F (36.6 C)  TempSrc: Oral Axillary  Oral  SpO2: 97% 97%  94%  Weight:   84.7 kg   Height:       Body mass index is 28.39 kg/m.  Physical Exam: Constitutional:Sleeping, in no acute distress. Pulm:Normal work of breathing on room air. Neuro:Asleep, does not wake and engage with team.   Lab Results Lab Results  Component Value Date   WBC  5.3 09/11/2022   HGB 8.2 (L) 09/11/2022   HCT 25.9 (L) 09/11/2022   MCV 92.5 09/11/2022   PLT 168 09/11/2022    Lab Results  Component Value Date   CREATININE 1.00 09/11/2022   BUN 18 09/11/2022   NA 139 09/11/2022   K 3.5 09/11/2022   CL 110 09/11/2022   CO2 21 (L) 09/11/2022    Lab Results  Component Value Date   ALT 24 09/09/2022   AST 31 09/09/2022   ALKPHOS 61 09/09/2022   BILITOT 1.0 09/09/2022      Microbiology: Recent Results (from the past 240 hour(s))  Blood Culture (routine x 2)      Status: Abnormal   Collection Time: 09/06/22 10:01 AM   Specimen: BLOOD RIGHT FOREARM  Result Value Ref Range Status   Specimen Description BLOOD RIGHT FOREARM  Final   Special Requests   Final    BOTTLES DRAWN AEROBIC AND ANAEROBIC Blood Culture adequate volume   Culture  Setup Time   Final    GRAM POSITIVE COCCI ANAEROBIC BOTTLE ONLY CRITICAL RESULT CALLED TO, READ BACK BY AND VERIFIED WITH: PHARMD K. AHMEND [id number] @2100  FH    Culture (A)  Final    GRANULICATELLA ADIACENS Standardized susceptibility testing for this organism is not available. Performed at [facility], [address] [place], [place] [address]    Report Status 09/10/2022 FINAL  Final  Blood Culture ID Panel (Reflexed)     Status: None   Collection Time: 09/06/22 10:01 AM  Result Value Ref Range Status   Enterococcus faecalis NOT DETECTED NOT DETECTED Final   Enterococcus Faecium NOT DETECTED NOT DETECTED Final   Listeria monocytogenes NOT DETECTED NOT DETECTED Final   Staphylococcus species NOT DETECTED NOT DETECTED Final   Staphylococcus aureus (BCID) NOT DETECTED NOT DETECTED Final   Staphylococcus epidermidis NOT DETECTED NOT DETECTED Final   Staphylococcus lugdunensis NOT DETECTED NOT DETECTED Final   Streptococcus species NOT DETECTED NOT DETECTED Final   Streptococcus agalactiae NOT DETECTED NOT DETECTED Final   Streptococcus pneumoniae NOT DETECTED NOT DETECTED Final   Streptococcus pyogenes NOT DETECTED NOT DETECTED Final   A.calcoaceticus-baumannii NOT DETECTED NOT DETECTED Final   Bacteroides fragilis NOT DETECTED NOT DETECTED Final   Enterobacterales NOT DETECTED NOT DETECTED Final   Enterobacter cloacae complex NOT DETECTED NOT DETECTED Final   Escherichia coli NOT DETECTED NOT DETECTED Final   Klebsiella aerogenes NOT DETECTED NOT DETECTED Final   Klebsiella oxytoca NOT DETECTED NOT DETECTED Final   Klebsiella pneumoniae NOT DETECTED NOT DETECTED Final   Proteus species NOT DETECTED NOT  DETECTED Final   Salmonella species NOT DETECTED NOT DETECTED Final   Serratia marcescens NOT DETECTED NOT DETECTED Final   Haemophilus influenzae NOT DETECTED NOT DETECTED Final   Neisseria meningitidis NOT DETECTED NOT DETECTED Final   Pseudomonas aeruginosa NOT DETECTED NOT DETECTED Final   Stenotrophomonas maltophilia NOT DETECTED NOT DETECTED Final   Candida albicans NOT DETECTED NOT DETECTED Final   Candida auris NOT DETECTED NOT DETECTED Final   Candida glabrata NOT DETECTED NOT DETECTED Final   Candida krusei NOT DETECTED NOT DETECTED Final   Candida parapsilosis NOT DETECTED NOT DETECTED Final   Candida tropicalis NOT DETECTED NOT DETECTED Final   Cryptococcus neoformans/gattii NOT DETECTED NOT DETECTED Final    Comment: Performed at [facility], [address], [place], [place] [address]  SARS Coronavirus 2 by RT PCR (hospital order, performed in [facility] hospital lab) *cepheid single  result test* Anterior Nasal Swab     Status: None   Collection Time: 09/06/22 10:02 AM   Specimen: Anterior Nasal Swab  Result Value Ref Range Status   SARS Coronavirus 2 by RT PCR NEGATIVE NEGATIVE Final    Comment: Performed at [facility], [address], [place], [place] [address]  Urine Culture     Status: Abnormal   Collection Time: 09/06/22 10:03 AM   Specimen: Urine, Catheterized  Result Value Ref Range Status   Specimen Description URINE, CATHETERIZED  Final   Special Requests   Final    NONE Performed at [facility], [address] [place], [place] [address]    Culture MULTIPLE SPECIES PRESENT, SUGGEST RECOLLECTION (A)  Final   Report Status 09/07/2022 FINAL  Final  Blood Culture (routine x 2)     Status: None   Collection Time: 09/06/22 10:06 AM   Specimen: BLOOD  Result Value Ref Range Status   Specimen Description BLOOD SITE NOT SPECIFIED  Final   Special Requests   Final    BOTTLES DRAWN AEROBIC AND ANAEROBIC Blood Culture adequate volume   Culture    Final    NO GROWTH 5 DAYS Performed at [facility], [address], [place], [place] [address]    Report Status 09/11/2022 FINAL  Final  Culture, blood (Routine X 2) w Reflex to ID Panel     Status: None (Preliminary result)   Collection Time: 09/10/22 10:47 AM   Specimen: BLOOD RIGHT HAND  Result Value Ref Range Status   Specimen Description BLOOD RIGHT HAND  Final   Special Requests   Final    BOTTLES DRAWN AEROBIC AND ANAEROBIC Blood Culture adequate volume   Culture   Final    NO GROWTH < 24 HOURS Performed at [facility], [address], [place], [place] [address]    Report Status PENDING  Incomplete  Culture, blood (Routine X 2) w Reflex to ID Panel     Status: None (Preliminary result)   Collection Time: 09/10/22 10:50 AM   Specimen: BLOOD LEFT HAND  Result Value Ref Range Status   Specimen Description BLOOD LEFT HAND  Final   Special Requests   Final    BOTTLES DRAWN AEROBIC AND ANAEROBIC Blood Culture adequate volume   Culture   Final    NO GROWTH < 24 HOURS Performed at [facility], [address] [place], [place] [address]    Report Status PENDING  Incomplete    Imaging: If present, new imagings (plain films, ct scans, and mri) have been personally visualized and interpreted; radiology reports have been reviewed. Decision making incorporated into the Impression / Recommendations.   Champ Mungo, DO Internal Medicine PGY-3 09/11/2022, 8:36 AM

## 2022-09-11 NOTE — Progress Notes (Signed)
[facility]                                                                               Harry [name], is a 77 y.o. male, DOB - 1945/10/06, BJY:[id number] Admit date - 09/06/2022    Outpatient Primary MD for the patient is System, Provider Not In  LOS - 5  days    Brief summary  Patient is a 77 year old male with history of fairly advanced dementia (query type.  Query Lewy body dementia versus vascular dementia versus Alzheimer's dementia).  Other medical history includes chronic kidney disease stage IIIa, diabetes mellitus, anemia, hypertension, hyperlipidemia and syncope about peripheral vascular disease s/p angioplasty and stenting, depression and glaucoma.  Patient was admitted with altered mental status, volume depletion, acute kidney injury and likely UTI.  Urine cultures growing multiple  species.  Blood culture has grown GRANULICATELLA ADIACENS.  Infectious disease input is appreciated.  Patient is now on IV vancomycin.  EGD and TEE have been broached.  Input from GI and cardiology team is appreciated.  MRI and CT head negative for acute events.  Patient will need IV antibiotics for 6 weeks if TEE is not feasible (IV vancomycin)   Assessment & Plan  Severe sepsis with septic shock:  -Likely sepsis secondary to UTI. -Chest x-ray looks good. -Hypotension has resolved. -Continue IV vancomycin, cefepime and Flagyl. -On admission, patient was hypotensive, with  leukocytosis, tachypnea, elevated lactic acid on admission.  -Urine culture grew multiple organisms.  Will repeat urine culture.  -Continue to monitor renal function and electrolytes. 09/09/2022: Sepsis physiology has resolved.  Continue IV antibiotics. 09/10/2022: See above documentation.  Blood cultures growing granulicatella adiacens.  Urine cultures growing multiple organisms.  Antibiotics narrowed down to IV vancomycin as per infectious disease.  Patient seems to be improving. 09/11/2022: Sepsis physiology has  resolved.  Patient looks a lot better today.  Patient is more communicative.  Acute encephalopathy, multifactorial, likely combined toxic and metabolic: -Acute UTI, volume depletion, acute kidney injury on presentation. -Urine cultures growing multiple organisms. -Encephalopathy is resolving. -Address and treat any other underlying problems. 09/09/2022: Continues to resolve. 09/10/2022: Generally, some improvement is noted. 09/11/2022: Improved.   Acute versus chronic anemia of unclear etiology.  -Hemoglobin is above baseline. -Continue to monitor H/H.   -GI input is appreciated.  No EGD planned for now.  Acute on stage 3 a CKD versus acute kidney injury:  Baseline creatinine is around 1.5, admitted with a creatinine of 2. 45,  Gently hydrate and repeat renal parameters in am.  09/09/2022: Improved significantly.  IV fluid be discontinued. 09/10/2022: Acute kidney injury has resolved.  Insulin dependent DM;  Continue with SSI.   H/o CVA with hemiplegia:  Was on aspirin, plavix and statin.  DAPT on hold for anemia.   Hypertension;  Episodes of hypotension resolved on admission.  Currently BP parameters are optimal.   H/O PAD On DAPT and statin , which were on hold due to FOBT positive and anemia.  Continue with statin for now.    Estimated body mass index is 28.39 kg/m as calculated from the following:   Height as of this encounter: 5\' 8"  (1.727 m).  Weight as of this encounter: 84.7 kg.  Code Status: full code.  DVT Prophylaxis:  SCDs Start: 09/06/22 1341   Level of Care: Level of care: Progressive Family Communication: none at bedside.   Disposition Plan:     Remains inpatient appropriate:  IV antibiotics, anemia work up.  AMS.   Procedures:  None.   Consultants:   Gastroenterology.   Antimicrobials:  IV vancomycin  Anti-infectives (From admission, onward)    Start     Dose/Rate Route Frequency Ordered Stop   09/10/22 1200  vancomycin (VANCOREADY) IVPB 1750  mg/350 mL        1,750 mg 175 mL/hr over 120 Minutes Intravenous Every 24 hours 09/10/22 0953     09/10/22 1200  ceFEPIme (MAXIPIME) 2 g in sodium chloride 0.9 % 100 mL IVPB  Status:  Discontinued        2 g 200 mL/hr over 30 Minutes Intravenous Every 8 hours 09/10/22 0953 09/10/22 1027   09/08/22 1315  ceFEPIme (MAXIPIME) 2 g in sodium chloride 0.9 % 100 mL IVPB  Status:  Discontinued        2 g 200 mL/hr over 30 Minutes Intravenous Every 12 hours 09/08/22 1304 09/10/22 0953   09/08/22 1300  vancomycin (VANCOREADY) IVPB 1500 mg/300 mL  Status:  Discontinued        1,500 mg 150 mL/hr over 120 Minutes Intravenous Every 24 hours 09/08/22 1212 09/10/22 0953   09/07/22 1400  ceFEPIme (MAXIPIME) 2 g in sodium chloride 0.9 % 100 mL IVPB  Status:  Discontinued        2 g 200 mL/hr over 30 Minutes Intravenous Every 24 hours 09/06/22 1250 09/08/22 1304   09/07/22 1230  vancomycin (VANCOREADY) IVPB 750 mg/150 mL  Status:  Discontinued        750 mg 150 mL/hr over 60 Minutes Intravenous Every 24 hours 09/07/22 1139 09/08/22 1212   09/06/22 2200  metroNIDAZOLE (FLAGYL) IVPB 500 mg  Status:  Discontinued        500 mg 100 mL/hr over 60 Minutes Intravenous Every 12 hours 09/06/22 1344 09/10/22 1027   09/06/22 1250  vancomycin variable dose per unstable renal function (pharmacist dosing)  Status:  Discontinued         Does not apply See admin instructions 09/06/22 1250 09/07/22 1159   09/06/22 1015  metroNIDAZOLE (FLAGYL) IVPB 500 mg        500 mg 100 mL/hr over 60 Minutes Intravenous  Once 09/06/22 1002 09/06/22 1216   09/06/22 1015  ceFEPIme (MAXIPIME) 2 g in sodium chloride 0.9 % 100 mL IVPB        2 g 200 mL/hr over 30 Minutes Intravenous  Once 09/06/22 1010 09/06/22 1133   09/06/22 1015  vancomycin (VANCOREADY) IVPB 1500 mg/300 mL        1,500 mg 150 mL/hr over 120 Minutes Intravenous  Once 09/06/22 1010 09/06/22 1436        Medications  Scheduled Meds:  carvedilol  12.5 mg Oral BID WC    Chlorhexidine Gluconate Cloth  6 each Topical Daily   folic acid  1 mg Oral Daily   insulin aspart  0-5 Units Subcutaneous QHS   insulin aspart  0-9 Units Subcutaneous TID WC   pantoprazole  40 mg Oral BID   pyridostigmine  30 mg Oral Q8H   rosuvastatin  5 mg Oral QHS   sertraline  25 mg Oral Daily   sodium chloride flush  3 mL Intravenous Q12H  tamsulosin  0.4 mg Oral Daily   Continuous Infusions:  vancomycin 1,750 mg (09/11/22 1232)   PRN Meds:.acetaminophen **OR** acetaminophen, food thickener, hydrALAZINE, polyethylene glycol    Subjective:   Harry [name] was seen and examined today. No significant history from patient.    Objective:   Vitals:   09/11/22 0500 09/11/22 0708 09/11/22 1140 09/11/22 1612  BP:  (!) 164/70 (!) 152/60 (!) 162/78  Pulse:  73 71 72  Resp:  13 12 15   Temp:  97.8 F (36.6 C) 97.9 F (36.6 C) (!) 97.5 F (36.4 C)  TempSrc:  Oral Oral Oral  SpO2:  94% 97% 95%  Weight: 84.7 kg     Height:        Intake/Output Summary (Last 24 hours) at 09/11/2022 2001 Last data filed at 09/11/2022 1240 Gross per 24 hour  Intake 710 ml  Output 1300 ml  Net -590 ml    Filed Weights   09/09/22 0615 09/10/22 0500 09/11/22 0500  Weight: 85.1 kg 84.6 kg 84.7 kg     Exam General exam: Chronically ill looking.  Not in any distress.   Neck: Supple.     Lungs: Clear to auscultation. CVS: S1-S2. Neurology ill appearing elderly gentleman, not in distress.  Gastrointestinal system: Abdomen is soft and nontender.   Data Reviewed:  I have personally reviewed following labs and imaging studies   CBC Lab Results  Component Value Date   WBC 5.3 09/11/2022   RBC 2.80 (L) 09/11/2022   HGB 8.2 (L) 09/11/2022   HCT 25.9 (L) 09/11/2022   MCV 92.5 09/11/2022   MCH 29.3 09/11/2022   PLT 168 09/11/2022   MCHC 31.7 09/11/2022   RDW 17.3 (H) 09/11/2022   LYMPHSABS 1.4 09/11/2022   MONOABS 1.0 09/11/2022   EOSABS 0.3 09/11/2022   BASOSABS 0.0 09/11/2022      Last metabolic panel Lab Results  Component Value Date   NA 139 09/11/2022   K 3.5 09/11/2022   CL 110 09/11/2022   CO2 21 (L) 09/11/2022   BUN 18 09/11/2022   CREATININE 1.00 09/11/2022   GLUCOSE 258 (H) 09/11/2022   GFRNONAA >60 09/11/2022   GFRAA 51 (L) 10/30/2019   CALCIUM 7.7 (L) 09/11/2022   PHOS 3.2 09/11/2022   PROT 5.2 (L) 09/09/2022   ALBUMIN 2.2 (L) 09/11/2022   LABGLOB 3.2 11/11/2015   AGRATIO 1.3 11/11/2015   BILITOT 1.0 09/09/2022   ALKPHOS 61 09/09/2022   AST 31 09/09/2022   ALT 24 09/09/2022   ANIONGAP 8 09/11/2022    CBG (last 3)  Recent Labs    09/11/22 0602 09/11/22 1158 09/11/22 1610  GLUCAP 160* 119* 117*       Coagulation Profile: Recent Labs  Lab 09/06/22 1000  INR 1.3*      Radiology Studies: ECHOCARDIOGRAM COMPLETE  Result Date: 09/11/2022    ECHOCARDIOGRAM REPORT   Patient Name:   Harry [name] Date of Exam: 09/11/2022 Medical Rec #:  [id number]        Height:       68.0 in Accession #:    [id number]       Weight:       186.7 lb Date of Birth:  1945/08/09         BSA:          1.985 m Patient Age:    76 years         BP:           106/63  mmHg Patient Gender: M                HR:           71 bpm. Exam Location:  Inpatient Procedure: 2D Echo, Cardiac Doppler, Color Doppler and Strain Analysis Indications:    Bacteremia R78.81  History:        Patient has prior history of Echocardiogram examinations, most                 recent 06/03/2022. Stroke and AMS, Anemia, CKD,                 Signs/Symptoms:Hypotension; Risk Factors:Hypertension, Diabetes                 and Dyslipidemia.  Sonographer:    Jake Seats RDMS, RVT, RDCS Referring Phys: [id number] TRUNG T VU IMPRESSIONS  1. Left ventricular ejection fraction, by estimation, is 60 to 65%. The left ventricle has normal function. The left ventricle has no regional wall motion abnormalities. There is mild concentric left ventricular hypertrophy. Left ventricular diastolic parameters are  consistent with Grade I diastolic dysfunction (impaired relaxation). Elevated left atrial pressure. The average left ventricular global longitudinal strain is -19.0 %. The global longitudinal strain is normal.  2. Right ventricular systolic function is normal. The right ventricular size is normal.  3. The mitral valve is normal in structure. No evidence of mitral valve regurgitation. No evidence of mitral stenosis.  4. The aortic valve is tricuspid. There is mild calcification of the aortic valve. There is mild thickening of the aortic valve. Aortic valve regurgitation is not visualized. Aortic valve sclerosis/calcification is present, without any evidence of aortic stenosis.  5. The inferior vena cava is normal in size with greater than 50% respiratory variability, suggesting right atrial pressure of 3 mmHg. Comparison(s): Prior images reviewed side by side. The pericardial effusion is no longer present. Conclusion(s)/Recommendation(s): No evidence of valvular vegetations on this transthoracic echocardiogram. Consider a transesophageal echocardiogram to exclude infective endocarditis if clinically indicated. FINDINGS  Left Ventricle: Left ventricular ejection fraction, by estimation, is 60 to 65%. The left ventricle has normal function. The left ventricle has no regional wall motion abnormalities. The average left ventricular global longitudinal strain is -19.0 %. The global longitudinal strain is normal. The left ventricular internal cavity size was normal in size. There is mild concentric left ventricular hypertrophy. Left ventricular diastolic parameters are consistent with Grade I diastolic dysfunction (impaired relaxation). Elevated left atrial pressure. Right Ventricle: The right ventricular size is normal. No increase in right ventricular wall thickness. Right ventricular systolic function is normal. Left Atrium: Left atrial size was normal in size. Right Atrium: Right atrial size was normal in size.  Pericardium: There is no evidence of pericardial effusion. Mitral Valve: The mitral valve is normal in structure. No evidence of mitral valve regurgitation. No evidence of mitral valve stenosis. Tricuspid Valve: The tricuspid valve is normal in structure. Tricuspid valve regurgitation is not demonstrated. No evidence of tricuspid stenosis. Aortic Valve: The aortic valve is tricuspid. There is mild calcification of the aortic valve. There is mild thickening of the aortic valve. Aortic valve regurgitation is not visualized. Aortic valve sclerosis/calcification is present, without any evidence of aortic stenosis. Aortic valve mean gradient measures 3.0 mmHg. Aortic valve peak gradient measures 5.2 mmHg. Aortic valve area, by VTI measures 2.68 cm. Pulmonic Valve: The pulmonic valve was normal in structure. Pulmonic valve regurgitation is not visualized. No evidence of pulmonic stenosis. Aorta: The aortic  root is normal in size and structure. Venous: The inferior vena cava is normal in size with greater than 50% respiratory variability, suggesting right atrial pressure of 3 mmHg. IAS/Shunts: No atrial level shunt detected by color flow Doppler.  LEFT VENTRICLE PLAX 2D LVIDd:         4.40 cm     Diastology LVIDs:         2.60 cm     LV e' medial:    4.57 cm/s LV PW:         1.30 cm     LV E/e' medial:  18.6 LV IVS:        1.30 cm     LV e' lateral:   4.68 cm/s LVOT diam:     1.90 cm     LV E/e' lateral: 18.2 LV SV:         59 LV SV Index:   30          2D Longitudinal Strain LVOT Area:     2.84 cm    2D Strain GLS (A2C):   -19.7 %                            2D Strain GLS (A3C):   -19.1 %                            2D Strain GLS (A4C):   -18.2 % LV Volumes (MOD)           2D Strain GLS Avg:     -19.0 % LV vol d, MOD A2C: 65.3 ml LV vol d, MOD A4C: 50.3 ml LV vol s, MOD A2C: 20.3 ml LV vol s, MOD A4C: 19.5 ml LV SV MOD A2C:     45.0 ml LV SV MOD A4C:     50.3 ml LV SV MOD BP:      36.8 ml RIGHT VENTRICLE RV S prime:      12.10 cm/s TAPSE (M-mode): 2.1 cm LEFT ATRIUM             Index        RIGHT ATRIUM           Index LA diam:        3.50 cm 1.76 cm/m   RA Area:     10.10 cm LA Vol (A2C):   57.4 ml 28.92 ml/m  RA Volume:   17.20 ml  8.67 ml/m LA Vol (A4C):   43.4 ml 21.86 ml/m LA Biplane Vol: 54.6 ml 27.51 ml/m  AORTIC VALVE                    PULMONIC VALVE AV Area (Vmax):    2.43 cm     PR End Diast Vel: 4.16 msec AV Area (Vmean):   2.66 cm AV Area (VTI):     2.68 cm AV Vmax:           114.00 cm/s AV Vmean:          73.500 cm/s AV VTI:            0.221 m AV Peak Grad:      5.2 mmHg AV Mean Grad:      3.0 mmHg LVOT Vmax:         97.90 cm/s LVOT Vmean:        68.900 cm/s LVOT VTI:  0.209 m LVOT/AV VTI ratio: 0.95  AORTA Ao Root diam: 3.80 cm Ao Asc diam:  3.60 cm MITRAL VALVE                TRICUSPID VALVE MV Area (PHT): 3.77 cm     TR Peak grad:   4.2 mmHg MV Decel Time: 201 msec     TR Vmax:        102.00 cm/s MV E velocity: 85.10 cm/s MV A velocity: 107.00 cm/s  SHUNTS MV E/A ratio:  0.80         Systemic VTI:  0.21 m                             Systemic Diam: 1.90 cm Rachelle Hora Croitoru MD Electronically signed by Thurmon Fair MD Signature Date/Time: 09/11/2022/1:35:48 PM    Final     Time spent: 55 minutes.   Barnetta Chapel M.D. [facility] 09/11/2022, 8:01 PM  Available via Epic secure chat 7am-7pm After 7 pm, please refer to night coverage provider listed on amion.

## 2022-09-12 DIAGNOSIS — B954 Other streptococcus as the cause of diseases classified elsewhere: Secondary | ICD-10-CM | POA: Diagnosis not present

## 2022-09-12 DIAGNOSIS — G9340 Encephalopathy, unspecified: Secondary | ICD-10-CM | POA: Diagnosis not present

## 2022-09-12 DIAGNOSIS — F0390 Unspecified dementia without behavioral disturbance: Secondary | ICD-10-CM | POA: Diagnosis not present

## 2022-09-12 DIAGNOSIS — Z452 Encounter for adjustment and management of vascular access device: Secondary | ICD-10-CM | POA: Diagnosis not present

## 2022-09-12 DIAGNOSIS — R7881 Bacteremia: Secondary | ICD-10-CM | POA: Diagnosis not present

## 2022-09-12 LAB — GLUCOSE, CAPILLARY
Glucose-Capillary: 205 mg/dL — ABNORMAL HIGH (ref 70–99)
Glucose-Capillary: 149 mg/dL — ABNORMAL HIGH (ref 70–99)
Glucose-Capillary: 180 mg/dL — ABNORMAL HIGH (ref 70–99)
Glucose-Capillary: 228 mg/dL — ABNORMAL HIGH (ref 70–99)

## 2022-09-12 NOTE — Progress Notes (Addendum)
PROGRESS NOTE  MERRELL BORSUK  ZOX:[id number] DOB: 1945-05-19 DOA: 09/06/2022 PCP: System, Provider Not In   Brief Narrative: Patient is 77 year old male with with history of advanced dementia, CKD stage III, Type II, anemia, hypertension, hyperlipidemia, peripheral vascular disease, depression who presented with altered mental status.  On presentation,he was found to be dehydrated, altered, lab work showed AKI.  Hospital course remarkable for finding of bacteremia with  GRANULICATELLA ADIACENS .  ID consulted.  Started on vancomycin.  Cardiology also consulted for consideration of TEE  for bacteremia which has not been done yet.  MRI, CT imagings of the brain were negative for acute findings.  PT/OT recommending skilled facility on discharge  Assessment & Plan:  Principal Problem:   Acute encephalopathy Active Problems:   Type 2 diabetes mellitus with stage 3 chronic kidney disease, with long-term current use of insulin (HCC)   Essential hypertension   PAD (peripheral artery disease) (HCC)   S/P peripheral artery angioplasty   Acute renal failure superimposed on stage 3a chronic kidney disease (HCC)   Lewy body dementia (HCC)   History of CVA (cerebrovascular accident)   Sepsis (HCC)   Normocytic anemia   Hyperlipidemia   Dementia (HCC)   GI bleed   Heme positive stool   Acute on chronic anemia   Antiplatelet or antithrombotic long-term use   Anemia   Folate deficiency   Bacteremia  Granulicatella bacteremia : Sepsis physiology has resolved.  Currently on IV vancomycin.  ID following.  Plan for TEE to rule out endocarditis but due to mentation, not done yet.  Cardiology following.  ID recommending 6 weeks of IV vancomycin if IE not ruled out.  If IV is ruled out, plan for 10 days of vancomycin.  Advanced dementia: History of Lewy body dementia.  Presented with more confusion from baseline, altered mentation.  Continue supportive care, delirium precautions. Also has history of  CVA/peripheral vascular disease.  Was on aspirin and Plavix at home which are on hold. CT/MRI of the brain negative for acute findings.  On Mestinon at home  Diabetes type 2: On insulin at home.  Currently on sliding scale.  Monitor blood sugars  Normocytic anemia: No evidence of active bleeding.  GI was following but has signed off.  Continue to monitor hemoglobin intermittently.  AKI on CKD stage IIIa:   Presented with creatinine in the range of 2.4.  Currently confused at baseline. Patient found to have retained urine on presentation, Foley placed.  Will give voiding trial  Deconditioning/debility: PT/OT recommending skilled subsequent discharge.  TOC consulted.  Goals of care: Elderly patient with multiple comorbidities.  Palliative care consulted for goals of care but deferred by family with intention of full scope of treatment       DVT prophylaxis:SCDs Start: 09/06/22 1341     Code Status: Full Code  Family Communication: None at the bedside  Patient status:Inpatient  Patient is from :home  Anticipated discharge to:SNF  Estimated DC date:2-3 days   Consultants: ID, cardiology, GI  Procedures: None  Antimicrobials:  Anti-infectives (From admission, onward)    Start     Dose/Rate Route Frequency Ordered Stop   09/10/22 1200  vancomycin (VANCOREADY) IVPB 1750 mg/350 mL        1,750 mg 175 mL/hr over 120 Minutes Intravenous Every 24 hours 09/10/22 0953     09/10/22 1200  ceFEPIme (MAXIPIME) 2 g in sodium chloride 0.9 % 100 mL IVPB  Status:  Discontinued  2 g 200 mL/hr over 30 Minutes Intravenous Every 8 hours 09/10/22 0953 09/10/22 1027   09/08/22 1315  ceFEPIme (MAXIPIME) 2 g in sodium chloride 0.9 % 100 mL IVPB  Status:  Discontinued        2 g 200 mL/hr over 30 Minutes Intravenous Every 12 hours 09/08/22 1304 09/10/22 0953   09/08/22 1300  vancomycin (VANCOREADY) IVPB 1500 mg/300 mL  Status:  Discontinued        1,500 mg 150 mL/hr over 120 Minutes  Intravenous Every 24 hours 09/08/22 1212 09/10/22 0953   09/07/22 1400  ceFEPIme (MAXIPIME) 2 g in sodium chloride 0.9 % 100 mL IVPB  Status:  Discontinued        2 g 200 mL/hr over 30 Minutes Intravenous Every 24 hours 09/06/22 1250 09/08/22 1304   09/07/22 1230  vancomycin (VANCOREADY) IVPB 750 mg/150 mL  Status:  Discontinued        750 mg 150 mL/hr over 60 Minutes Intravenous Every 24 hours 09/07/22 1139 09/08/22 1212   09/06/22 2200  metroNIDAZOLE (FLAGYL) IVPB 500 mg  Status:  Discontinued        500 mg 100 mL/hr over 60 Minutes Intravenous Every 12 hours 09/06/22 1344 09/10/22 1027   09/06/22 1250  vancomycin variable dose per unstable renal function (pharmacist dosing)  Status:  Discontinued         Does not apply See admin instructions 09/06/22 1250 09/07/22 1159   09/06/22 1015  metroNIDAZOLE (FLAGYL) IVPB 500 mg        500 mg 100 mL/hr over 60 Minutes Intravenous  Once 09/06/22 1002 09/06/22 1216   09/06/22 1015  ceFEPIme (MAXIPIME) 2 g in sodium chloride 0.9 % 100 mL IVPB        2 g 200 mL/hr over 30 Minutes Intravenous  Once 09/06/22 1010 09/06/22 1133   09/06/22 1015  vancomycin (VANCOREADY) IVPB 1500 mg/300 mL        1,500 mg 150 mL/hr over 120 Minutes Intravenous  Once 09/06/22 1010 09/06/22 1436       Subjective: Patient seen and examined at bedside today.  Appears comfortable.  Lying in bed.  Alert and awake but confused.  Not in any current distress.  No new complaints.  On room air.  Objective: Vitals:   09/12/22 0000 09/12/22 0350 09/12/22 0500 09/12/22 0726  BP: (!) 169/77 (!) 167/73  (!) 161/79  Pulse: 76 78  72  Resp: 16 16  13   Temp:  98.6 F (37 C)  98.1 F (36.7 C)  TempSrc:  Axillary  Oral  SpO2: 99% 99%  100%  Weight:   82.1 kg   Height:        Intake/Output Summary (Last 24 hours) at 09/12/2022 0981 Last data filed at 09/12/2022 0600 Gross per 24 hour  Intake 370 ml  Output 1400 ml  Net -1030 ml   Filed Weights   09/10/22 0500 09/11/22  0500 09/12/22 0500  Weight: 84.6 kg 84.7 kg 82.1 kg    Examination:  General exam: Overall comfortable, not in distress, deconditioned HEENT: Cataract on the left eye Respiratory system:  no wheezes or crackles  Cardiovascular system: S1 & S2 heard, RRR.  Gastrointestinal system: Abdomen is nondistended, soft and nontender. Central nervous system: Alert and awake but not oriented, left hemiparesis Extremities: No edema, no clubbing ,no cyanosis Skin: No rashes, no ulcers,no icterus   GU: Foley   Data Reviewed: I have personally reviewed following labs and imaging studies  CBC: Recent Labs  Lab 09/06/22 1000 09/06/22 1755 09/07/22 0737 09/08/22 0715 09/08/22 1808 09/10/22 0134 09/11/22 0109  WBC 12.4*   < > 19.5* 18.2* 16.1* 9.4 5.3  NEUTROABS 10.8*  --   --  15.5* 14.0* 6.6 2.5  HGB 7.1*   < > 8.9* 8.8* 8.5* 8.8* 8.2*  HCT 23.5*   < > 28.6* 28.3* 26.8* 27.2* 25.9*  MCV 104.0*   < > 95.7 97.3 95.4 95.8 92.5  PLT 162   < > 159 150 155 156 168   < > = values in this interval not displayed.   Basic Metabolic Panel: Recent Labs  Lab 09/06/22 1000 09/07/22 0131 09/08/22 0715 09/09/22 0125 09/10/22 0134 09/11/22 0109  NA 141 140 144 139 140 139  K 3.6 3.9 3.3* 3.2* 3.5 3.5  CL 110 110 109 113* 110 110  CO2 20* 20* 21* 20* 23 21*  GLUCOSE 112* 143* 160* 235* 196* 258*  BUN 29* 33* 28* 18 14 18   CREATININE 2.45* 1.98* 1.00 1.12 0.92 1.00  CALCIUM 7.8* 7.5* 7.6* 7.2* 7.6* 7.7*  MG 1.4* 1.8  --   --  1.5* 2.0  PHOS  --   --   --   --  1.5* 3.2     Recent Results (from the past 240 hour(s))  Blood Culture (routine x 2)     Status: Abnormal   Collection Time: 09/06/22 10:01 AM   Specimen: BLOOD RIGHT FOREARM  Result Value Ref Range Status   Specimen Description BLOOD RIGHT FOREARM  Final   Special Requests   Final    BOTTLES DRAWN AEROBIC AND ANAEROBIC Blood Culture adequate volume   Culture  Setup Time   Final    GRAM POSITIVE COCCI ANAEROBIC BOTTLE  ONLY CRITICAL RESULT CALLED TO, READ BACK BY AND VERIFIED WITH: PHARMD K. AHMEND [id number] @2100  FH    Culture (A)  Final    GRANULICATELLA ADIACENS Standardized susceptibility testing for this organism is not available. Performed at [facility], [address] [place], [place] [address]    Report Status 09/10/2022 FINAL  Final  Blood Culture ID Panel (Reflexed)     Status: None   Collection Time: 09/06/22 10:01 AM  Result Value Ref Range Status   Enterococcus faecalis NOT DETECTED NOT DETECTED Final   Enterococcus Faecium NOT DETECTED NOT DETECTED Final   Listeria monocytogenes NOT DETECTED NOT DETECTED Final   Staphylococcus species NOT DETECTED NOT DETECTED Final   Staphylococcus aureus (BCID) NOT DETECTED NOT DETECTED Final   Staphylococcus epidermidis NOT DETECTED NOT DETECTED Final   Staphylococcus lugdunensis NOT DETECTED NOT DETECTED Final   Streptococcus species NOT DETECTED NOT DETECTED Final   Streptococcus agalactiae NOT DETECTED NOT DETECTED Final   Streptococcus pneumoniae NOT DETECTED NOT DETECTED Final   Streptococcus pyogenes NOT DETECTED NOT DETECTED Final   A.calcoaceticus-baumannii NOT DETECTED NOT DETECTED Final   Bacteroides fragilis NOT DETECTED NOT DETECTED Final   Enterobacterales NOT DETECTED NOT DETECTED Final   Enterobacter cloacae complex NOT DETECTED NOT DETECTED Final   Escherichia coli NOT DETECTED NOT DETECTED Final   Klebsiella aerogenes NOT DETECTED NOT DETECTED Final   Klebsiella oxytoca NOT DETECTED NOT DETECTED Final   Klebsiella pneumoniae NOT DETECTED NOT DETECTED Final   Proteus species NOT DETECTED NOT DETECTED Final   Salmonella species NOT DETECTED NOT DETECTED Final   Serratia marcescens NOT DETECTED NOT DETECTED Final   Haemophilus influenzae NOT DETECTED NOT DETECTED Final   Neisseria meningitidis  NOT DETECTED NOT DETECTED Final   Pseudomonas aeruginosa NOT DETECTED NOT DETECTED Final   Stenotrophomonas maltophilia NOT DETECTED  NOT DETECTED Final   Candida albicans NOT DETECTED NOT DETECTED Final   Candida auris NOT DETECTED NOT DETECTED Final   Candida glabrata NOT DETECTED NOT DETECTED Final   Candida krusei NOT DETECTED NOT DETECTED Final   Candida parapsilosis NOT DETECTED NOT DETECTED Final   Candida tropicalis NOT DETECTED NOT DETECTED Final   Cryptococcus neoformans/gattii NOT DETECTED NOT DETECTED Final    Comment: Performed at [facility], [address], [place], [place] [address]  SARS Coronavirus 2 by RT PCR (hospital order, performed in [facility] hospital lab) *cepheid single result test* Anterior Nasal Swab     Status: None   Collection Time: 09/06/22 10:02 AM   Specimen: Anterior Nasal Swab  Result Value Ref Range Status   SARS Coronavirus 2 by RT PCR NEGATIVE NEGATIVE Final    Comment: Performed at [facility], [address], [place], [place] [address]  Urine Culture     Status: Abnormal   Collection Time: 09/06/22 10:03 AM   Specimen: Urine, Catheterized  Result Value Ref Range Status   Specimen Description URINE, CATHETERIZED  Final   Special Requests   Final    NONE Performed at [facility], [address] [place], [place] [address]    Culture MULTIPLE SPECIES PRESENT, SUGGEST RECOLLECTION (A)  Final   Report Status 09/07/2022 FINAL  Final  Blood Culture (routine x 2)     Status: None   Collection Time: 09/06/22 10:06 AM   Specimen: BLOOD  Result Value Ref Range Status   Specimen Description BLOOD SITE NOT SPECIFIED  Final   Special Requests   Final    BOTTLES DRAWN AEROBIC AND ANAEROBIC Blood Culture adequate volume   Culture   Final    NO GROWTH 5 DAYS Performed at [facility], [address] [place], [place] [address]    Report Status 09/11/2022 FINAL  Final  Culture, OB Urine     Status: None   Collection Time: 09/10/22 10:28 AM   Specimen: Urine, Random  Result Value Ref Range Status   Specimen Description URINE, RANDOM  Final   Special  Requests NONE  Final   Culture   Final    NO GROWTH NO GROUP B STREP (S.AGALACTIAE) ISOLATED Performed at [facility], [address] [place], [place] [address]    Report Status 09/11/2022 FINAL  Final  Culture, blood (Routine X 2) w Reflex to ID Panel     Status: None (Preliminary result)   Collection Time: 09/10/22 10:47 AM   Specimen: BLOOD RIGHT HAND  Result Value Ref Range Status   Specimen Description BLOOD RIGHT HAND  Final   Special Requests   Final    BOTTLES DRAWN AEROBIC AND ANAEROBIC Blood Culture adequate volume   Culture   Final    NO GROWTH 2 DAYS Performed at [facility], [address], [place], [place] [address]    Report Status PENDING  Incomplete  Culture, blood (Routine X 2) w Reflex to ID Panel     Status: None (Preliminary result)   Collection Time: 09/10/22 10:50 AM   Specimen: BLOOD LEFT HAND  Result Value Ref Range Status   Specimen Description BLOOD LEFT HAND  Final   Special Requests   Final    BOTTLES DRAWN AEROBIC AND ANAEROBIC Blood Culture adequate volume  Culture   Final    NO GROWTH 2 DAYS Performed at [facility], [address] [place], [place] [address]    Report Status PENDING  Incomplete     Radiology Studies: ECHOCARDIOGRAM COMPLETE  Result Date: 09/11/2022    ECHOCARDIOGRAM REPORT   Patient Name:   BRAZOS SANDOVAL Date of Exam: 09/11/2022 Medical Rec #:  [id number]        Height:       68.0 in Accession #:    [id number]       Weight:       186.7 lb Date of Birth:  [birth year]         BSA:          1.985 m Patient Age:    76 years         BP:           106/63 mmHg Patient Gender: M                HR:           71 bpm. Exam Location:  Inpatient Procedure: 2D Echo, Cardiac Doppler, Color Doppler and Strain Analysis Indications:    Bacteremia R78.81  History:        Patient has prior history of Echocardiogram examinations, most                 recent 06/03/2022. Stroke and AMS, Anemia, CKD,                  Signs/Symptoms:Hypotension; Risk Factors:Hypertension, Diabetes                 and Dyslipidemia.  Sonographer:    Jake Seats RDMS, RVT, RDCS Referring Phys: [id number] TRUNG T VU IMPRESSIONS  1. Left ventricular ejection fraction, by estimation, is 60 to 65%. The left ventricle has normal function. The left ventricle has no regional wall motion abnormalities. There is mild concentric left ventricular hypertrophy. Left ventricular diastolic parameters are consistent with Grade I diastolic dysfunction (impaired relaxation). Elevated left atrial pressure. The average left ventricular global longitudinal strain is -19.0 %. The global longitudinal strain is normal.  2. Right ventricular systolic function is normal. The right ventricular size is normal.  3. The mitral valve is normal in structure. No evidence of mitral valve regurgitation. No evidence of mitral stenosis.  4. The aortic valve is tricuspid. There is mild calcification of the aortic valve. There is mild thickening of the aortic valve. Aortic valve regurgitation is not visualized. Aortic valve sclerosis/calcification is present, without any evidence of aortic stenosis.  5. The inferior vena cava is normal in size with greater than 50% respiratory variability, suggesting right atrial pressure of 3 mmHg. Comparison(s): Prior images reviewed side by side. The pericardial effusion is no longer present. Conclusion(s)/Recommendation(s): No evidence of valvular vegetations on this transthoracic echocardiogram. Consider a transesophageal echocardiogram to exclude infective endocarditis if clinically indicated. FINDINGS  Left Ventricle: Left ventricular ejection fraction, by estimation, is 60 to 65%. The left ventricle has normal function. The left ventricle has no regional wall motion abnormalities. The average left ventricular global longitudinal strain is -19.0 %. The global longitudinal strain is normal. The left ventricular internal cavity size was normal in  size. There is mild concentric left ventricular hypertrophy. Left ventricular diastolic parameters are consistent with Grade I diastolic dysfunction (impaired relaxation). Elevated left atrial pressure. Right Ventricle: The right ventricular size is normal. No increase in right ventricular wall thickness. Right ventricular  systolic function is normal. Left Atrium: Left atrial size was normal in size. Right Atrium: Right atrial size was normal in size. Pericardium: There is no evidence of pericardial effusion. Mitral Valve: The mitral valve is normal in structure. No evidence of mitral valve regurgitation. No evidence of mitral valve stenosis. Tricuspid Valve: The tricuspid valve is normal in structure. Tricuspid valve regurgitation is not demonstrated. No evidence of tricuspid stenosis. Aortic Valve: The aortic valve is tricuspid. There is mild calcification of the aortic valve. There is mild thickening of the aortic valve. Aortic valve regurgitation is not visualized. Aortic valve sclerosis/calcification is present, without any evidence of aortic stenosis. Aortic valve mean gradient measures 3.0 mmHg. Aortic valve peak gradient measures 5.2 mmHg. Aortic valve area, by VTI measures 2.68 cm. Pulmonic Valve: The pulmonic valve was normal in structure. Pulmonic valve regurgitation is not visualized. No evidence of pulmonic stenosis. Aorta: The aortic root is normal in size and structure. Venous: The inferior vena cava is normal in size with greater than 50% respiratory variability, suggesting right atrial pressure of 3 mmHg. IAS/Shunts: No atrial level shunt detected by color flow Doppler.  LEFT VENTRICLE PLAX 2D LVIDd:         4.40 cm     Diastology LVIDs:         2.60 cm     LV e' medial:    4.57 cm/s LV PW:         1.30 cm     LV E/e' medial:  18.6 LV IVS:        1.30 cm     LV e' lateral:   4.68 cm/s LVOT diam:     1.90 cm     LV E/e' lateral: 18.2 LV SV:         59 LV SV Index:   30          2D Longitudinal  Strain LVOT Area:     2.84 cm    2D Strain GLS (A2C):   -19.7 %                            2D Strain GLS (A3C):   -19.1 %                            2D Strain GLS (A4C):   -18.2 % LV Volumes (MOD)           2D Strain GLS Avg:     -19.0 % LV vol d, MOD A2C: 65.3 ml LV vol d, MOD A4C: 50.3 ml LV vol s, MOD A2C: 20.3 ml LV vol s, MOD A4C: 19.5 ml LV SV MOD A2C:     45.0 ml LV SV MOD A4C:     50.3 ml LV SV MOD BP:      36.8 ml RIGHT VENTRICLE RV S prime:     12.10 cm/s TAPSE (M-mode): 2.1 cm LEFT ATRIUM             Index        RIGHT ATRIUM           Index LA diam:        3.50 cm 1.76 cm/m   RA Area:     10.10 cm LA Vol (A2C):   57.4 ml 28.92 ml/m  RA Volume:   17.20 ml  8.67 ml/m LA Vol (A4C):   43.4 ml 21.86 ml/m  LA Biplane Vol: 54.6 ml 27.51 ml/m  AORTIC VALVE                    PULMONIC VALVE AV Area (Vmax):    2.43 cm     PR End Diast Vel: 4.16 msec AV Area (Vmean):   2.66 cm AV Area (VTI):     2.68 cm AV Vmax:           114.00 cm/s AV Vmean:          73.500 cm/s AV VTI:            0.221 m AV Peak Grad:      5.2 mmHg AV Mean Grad:      3.0 mmHg LVOT Vmax:         97.90 cm/s LVOT Vmean:        68.900 cm/s LVOT VTI:          0.209 m LVOT/AV VTI ratio: 0.95  AORTA Ao Root diam: 3.80 cm Ao Asc diam:  3.60 cm MITRAL VALVE                TRICUSPID VALVE MV Area (PHT): 3.77 cm     TR Peak grad:   4.2 mmHg MV Decel Time: 201 msec     TR Vmax:        102.00 cm/s MV E velocity: 85.10 cm/s MV A velocity: 107.00 cm/s  SHUNTS MV E/A ratio:  0.80         Systemic VTI:  0.21 m                             Systemic Diam: 1.90 cm Mihai Croitoru MD Electronically signed by Thurmon Fair MD Signature Date/Time: 09/11/2022/1:35:48 PM    Final     Scheduled Meds:  carvedilol  12.5 mg Oral BID WC   Chlorhexidine Gluconate Cloth  6 each Topical Daily   folic acid  1 mg Oral Daily   insulin aspart  0-5 Units Subcutaneous QHS   insulin aspart  0-9 Units Subcutaneous TID WC   pantoprazole  40 mg Oral BID   pyridostigmine   30 mg Oral Q8H   rosuvastatin  5 mg Oral QHS   sertraline  25 mg Oral Daily   sodium chloride flush  3 mL Intravenous Q12H   tamsulosin  0.4 mg Oral Daily   Continuous Infusions:  vancomycin 1,750 mg (09/11/22 1232)     LOS: 6 days   Burnadette Pop, MD [facility] P7/12/2022, 9:21 AM

## 2022-09-12 NOTE — Progress Notes (Signed)
Physical Therapy Treatment Patient Details Name: Harry [name] MRN: [id number] DOB: 31-Jul-1945 Today's Date: 09/12/2022   History of Present Illness Pt is a 77 y.o. male presenting 09/06/22 with AMS. MRI showed no acute intracranial abnormality. Admitted with sepsis and acute encephalopathy from bronchopneumonia. PMH includes: HTN, HLD, DM II, CVA, PVD, CKD IIIA, syncope, glaucoma, and dementia.    PT Comments  Pt received in supine and agreeable to session. Pt much more alert and able to follow commands this session. Pt continues to require max A +2 for all mobility due to weakness. Pt attempting to stand x2 from elevated EOB, however was unable due to BLE weakness. Pt able to perform seated therex with cues for technique. Pt unable to lateral scoot towards HOB requiring total A. Pt left with bed in chair position and eating breakfast. Pt continues to benefit from PT services to progress toward functional mobility goals.      Assistance Recommended at Discharge Frequent or constant Supervision/Assistance  If plan is discharge home, recommend the following:  Can travel by private vehicle    Two people to help with walking and/or transfers;Two people to help with bathing/dressing/bathroom;Assistance with cooking/housework;Assistance with feeding;Direct supervision/assist for financial management;Direct supervision/assist for medications management;Assist for transportation;Help with stairs or ramp for entrance   No  Equipment Recommendations  Other (comment) (defer to next venue)    Recommendations for Other Services       Precautions / Restrictions Precautions Precautions: Fall Restrictions Weight Bearing Restrictions: No     Mobility  Bed Mobility Overal bed mobility: Needs Assistance Bed Mobility: Supine to Sit, Sit to Supine     Supine to sit: HOB elevated, Max assist, +2 for physical assistance Sit to supine: Max assist, +2 for physical assistance   General bed mobility  comments: Pt able to initiate moving BLE to EOB, however required assist to move off the bed and for trunk elevation. Max A +2 for BLE elevation to EOB, trunk descent, and repositioning to return to supine.    Transfers Overall transfer level: Needs assistance Equipment used: Rolling walker (2 wheels) Transfers: Sit to/from Stand Sit to Stand: Total assist, +2 physical assistance, From elevated surface           General transfer comment: 2 attempts, however pt unable to stand from elevated EOB with total +2 due to BLE weakness and pt sliding forward    Ambulation/Gait               General Gait Details: unable        Balance Overall balance assessment: Needs assistance Sitting-balance support: Single extremity supported, Bilateral upper extremity supported, Feet supported Sitting balance-Leahy Scale: Poor Sitting balance - Comments: Pt with L lateral lean onto elbow and requiring min A to move back to midline. Pt is able to maintain midline with cues and min guard. Postural control: Left lateral lean     Standing balance comment: unable to stand                            Cognition Arousal/Alertness: Awake/alert Behavior During Therapy: Flat affect Overall Cognitive Status: History of cognitive impairments - at baseline                                 General Comments: Pt able to follow simple commands, but requires increased time for processing and cues to stay  focused        Exercises General Exercises - Lower Extremity Long Arc Quad: AROM, Seated, Both, 10 reps    General Comments General comments (skin integrity, edema, etc.): VSS on RA      Pertinent Vitals/Pain Pain Assessment Pain Assessment: No/denies pain     PT Goals (current goals can now be found in the care plan section) Acute Rehab PT Goals Patient Stated Goal: did not state PT Goal Formulation: With patient Time For Goal Achievement: 09/22/22 Potential to  Achieve Goals: Fair Progress towards PT goals: Progressing toward goals    Frequency    Min 1X/week      PT Plan Current plan remains appropriate       AM-PAC PT "6 Clicks" Mobility   Outcome Measure  Help needed turning from your back to your side while in a flat bed without using bedrails?: A Lot Help needed moving from lying on your back to sitting on the side of a flat bed without using bedrails?: Total Help needed moving to and from a bed to a chair (including a wheelchair)?: Total Help needed standing up from a chair using your arms (e.g., wheelchair or bedside chair)?: Total Help needed to walk in hospital room?: Total Help needed climbing 3-5 steps with a railing? : Total 6 Click Score: 7    End of Session Equipment Utilized During Treatment: Gait belt Activity Tolerance: Patient tolerated treatment well Patient left: in bed;with call bell/phone within reach;with bed alarm set Nurse Communication: Mobility status;Other (comment) (Pt eating) PT Visit Diagnosis: Unsteadiness on feet (R26.81);Muscle weakness (generalized) (M62.81);Difficulty in walking, not elsewhere classified (R26.2)     Time: 4782-9562 PT Time Calculation (min) (ACUTE ONLY): 18 min  Charges:    $Therapeutic Activity: 8-22 mins PT General Charges $$ ACUTE PT VISIT: 1 Visit                     Johny Shock, PTA Acute Rehabilitation Services Secure Chat Preferred  [phone]    Johny Shock 09/12/2022, 11:13 AM

## 2022-09-12 NOTE — Progress Notes (Signed)
[facility]  Date of Admission:  09/06/2022     CC: AMS - now positive for granulicatella adiacens bacteremia  Lines: Urethral catheter PIV x1  Antibiotics: Vancomycin 1750 mg IV q24h (07/04-present) Previously: cefepime 07/04-07/08 and metronidazole 07/04-07/08  Assessment: Granulicatella adiacens bacteremia: Blood culture from 07/04 with 1/4 bottles positive for Granulicatella adiacens. Repeat blood culture collected 07/08 shows NGTD. Remains afebrile, HDS with hypertension. TTE 07/09 without evidence of valvular vegetations. Case discussed with cardiology, GI teams re: need for TEE yesterday and ultimately it was decided that based on his exam at that time, the risks of undergoing TEE versus benefits given persistent AMS and baseline dementia with decreased likelihood of return to normal mental status outweigh benefits of procedure. Currently on IV vancomycin. 2. Altered mental status: Mental status much improved from previous days on my exam. He has purposeful communication, is feeding himself. May be at his baseline given history of dementia and probable component of hospital delirium in addition to recovery from sepsis with systemic illness. Is being treated for bacteremia, and repeat urine culture shows NGTD. Per primary.  3. C/f GIB: Per primary, GI following. 4. Hx dementia   Plan: Continue vancomycin Will update cardiology, GI teams re: improvement in mental status as I do still feel that he needs TEE and if he is stable at mental baseline over next few days, it may be a reasonable request Follow repeat blood cultures, urine culture.    Principal Problem:   Acute encephalopathy Active Problems:   Type 2 diabetes mellitus with stage 3 chronic kidney disease, with long-term current use of insulin (HCC)   Essential hypertension   PAD (peripheral artery disease) (HCC)   S/P peripheral artery angioplasty   Acute renal failure superimposed on  stage 3a chronic kidney disease (HCC)   Lewy body dementia (HCC)   History of CVA (cerebrovascular accident)   Sepsis (HCC)   Normocytic anemia   Hyperlipidemia   Dementia (HCC)   GI bleed   Heme positive stool   Acute on chronic anemia   Antiplatelet or antithrombotic long-term use   Anemia   Folate deficiency   Bacteremia   Allergies  Allergen Reactions   Lipitor [Atorvastatin] Other (See Comments)    Myalgias   Statins Other (See Comments)    Myalgias  Pt tolerating rosuvastatin 5mg  QD   Pravachol [Pravastatin] Rash    Scheduled Meds:  carvedilol  12.5 mg Oral BID WC   Chlorhexidine Gluconate Cloth  6 each Topical Daily   folic acid  1 mg Oral Daily   insulin aspart  0-5 Units Subcutaneous QHS   insulin aspart  0-9 Units Subcutaneous TID WC   pantoprazole  40 mg Oral BID   pyridostigmine  30 mg Oral Q8H   rosuvastatin  5 mg Oral QHS   sertraline  25 mg Oral Daily   sodium chloride flush  3 mL Intravenous Q12H   tamsulosin  0.4 mg Oral Daily   Continuous Infusions:  vancomycin 1,750 mg (09/11/22 1232)   PRN Meds:.acetaminophen **OR** acetaminophen, food thickener, hydrALAZINE, polyethylene glycol   SUBJECTIVE: NEON.   OBJECTIVE: Vitals:   09/12/22 0000 09/12/22 0350 09/12/22 0500 09/12/22 0726  BP: (!) 169/77 (!) 167/73  (!) 161/79  Pulse: 76 78  72  Resp: 16 16  13   Temp:  98.6 F (37 C)  98.1 F (36.7 C)  TempSrc:  Axillary  Oral  SpO2: 99% 99%  100%  Weight:   82.1 kg   Height:       Body mass index is 27.52 kg/m.  Physical Exam: Constitutional:Elderly gentleman enjoying breakfast in bed. In no acute distress. Neck:No tenderness. Cardio:Regular rate and rhythm. No murmurs, rubs, or gallops. Pulm:Clear to auscultation bilaterally. Normal work of breathing on room air. NWG:NFAOZHYQ for extremity edema. No pain over bilateral knee and shoulder joints. No focal midline spinal tenderness. Skin:Warm and dry. No rashes or lesions. Neuro:Alert and  oriented to self, location [facility]), but not to time of year. Psych:Pleasant mood and affect.  Lab Results Lab Results  Component Value Date   WBC 5.3 09/11/2022   HGB 8.2 (L) 09/11/2022   HCT 25.9 (L) 09/11/2022   MCV 92.5 09/11/2022   PLT 168 09/11/2022    Lab Results  Component Value Date   CREATININE 1.00 09/11/2022   BUN 18 09/11/2022   NA 139 09/11/2022   K 3.5 09/11/2022   CL 110 09/11/2022   CO2 21 (L) 09/11/2022    Lab Results  Component Value Date   ALT 24 09/09/2022   AST 31 09/09/2022   ALKPHOS 61 09/09/2022   BILITOT 1.0 09/09/2022     Microbiology: Recent Results (from the past 240 hour(s))  Blood Culture (routine x 2)     Status: Abnormal   Collection Time: 09/06/22 10:01 AM   Specimen: BLOOD RIGHT FOREARM  Result Value Ref Range Status   Specimen Description BLOOD RIGHT FOREARM  Final   Special Requests   Final    BOTTLES DRAWN AEROBIC AND ANAEROBIC Blood Culture adequate volume   Culture  Setup Time   Final    GRAM POSITIVE COCCI ANAEROBIC BOTTLE ONLY CRITICAL RESULT CALLED TO, READ BACK BY AND VERIFIED WITH: PHARMD K. AHMEND [id number] @2100  FH    Culture (A)  Final    GRANULICATELLA ADIACENS Standardized susceptibility testing for this organism is not available. Performed at [facility], [address] [place], [place] [address]    Report Status 09/10/2022 FINAL  Final  Blood Culture ID Panel (Reflexed)     Status: None   Collection Time: 09/06/22 10:01 AM  Result Value Ref Range Status   Enterococcus faecalis NOT DETECTED NOT DETECTED Final   Enterococcus Faecium NOT DETECTED NOT DETECTED Final   Listeria monocytogenes NOT DETECTED NOT DETECTED Final   Staphylococcus species NOT DETECTED NOT DETECTED Final   Staphylococcus aureus (BCID) NOT DETECTED NOT DETECTED Final   Staphylococcus epidermidis NOT DETECTED NOT DETECTED Final   Staphylococcus lugdunensis NOT DETECTED NOT DETECTED Final   Streptococcus species NOT DETECTED NOT  DETECTED Final   Streptococcus agalactiae NOT DETECTED NOT DETECTED Final   Streptococcus pneumoniae NOT DETECTED NOT DETECTED Final   Streptococcus pyogenes NOT DETECTED NOT DETECTED Final   A.calcoaceticus-baumannii NOT DETECTED NOT DETECTED Final   Bacteroides fragilis NOT DETECTED NOT DETECTED Final   Enterobacterales NOT DETECTED NOT DETECTED Final   Enterobacter cloacae complex NOT DETECTED NOT DETECTED Final   Escherichia coli NOT DETECTED NOT DETECTED Final   Klebsiella aerogenes NOT DETECTED NOT DETECTED Final   Klebsiella oxytoca NOT DETECTED NOT DETECTED Final   Klebsiella pneumoniae NOT DETECTED NOT DETECTED Final   Proteus species NOT DETECTED NOT DETECTED Final   Salmonella species NOT DETECTED NOT DETECTED Final   Serratia marcescens NOT DETECTED NOT DETECTED Final   Haemophilus influenzae NOT DETECTED NOT DETECTED Final   Neisseria meningitidis NOT DETECTED NOT DETECTED Final   Pseudomonas aeruginosa NOT DETECTED NOT  DETECTED Final   Stenotrophomonas maltophilia NOT DETECTED NOT DETECTED Final   Candida albicans NOT DETECTED NOT DETECTED Final   Candida auris NOT DETECTED NOT DETECTED Final   Candida glabrata NOT DETECTED NOT DETECTED Final   Candida krusei NOT DETECTED NOT DETECTED Final   Candida parapsilosis NOT DETECTED NOT DETECTED Final   Candida tropicalis NOT DETECTED NOT DETECTED Final   Cryptococcus neoformans/gattii NOT DETECTED NOT DETECTED Final    Comment: Performed at [facility], [address] [place], [place] [address]  SARS Coronavirus 2 by RT PCR (hospital order, performed in [facility] hospital lab) *cepheid single result test* Anterior Nasal Swab     Status: None   Collection Time: 09/06/22 10:02 AM   Specimen: Anterior Nasal Swab  Result Value Ref Range Status   SARS Coronavirus 2 by RT PCR NEGATIVE NEGATIVE Final    Comment: Performed at [facility], [address], [place], [place] [address]  Urine Culture     Status: Abnormal    Collection Time: 09/06/22 10:03 AM   Specimen: Urine, Catheterized  Result Value Ref Range Status   Specimen Description URINE, CATHETERIZED  Final   Special Requests   Final    NONE Performed at [facility], [address], [place], [place] [address]    Culture MULTIPLE SPECIES PRESENT, SUGGEST RECOLLECTION (A)  Final   Report Status 09/07/2022 FINAL  Final  Blood Culture (routine x 2)     Status: None   Collection Time: 09/06/22 10:06 AM   Specimen: BLOOD  Result Value Ref Range Status   Specimen Description BLOOD SITE NOT SPECIFIED  Final   Special Requests   Final    BOTTLES DRAWN AEROBIC AND ANAEROBIC Blood Culture adequate volume   Culture   Final    NO GROWTH 5 DAYS Performed at [facility], [address], [place], [place] [address]    Report Status 09/11/2022 FINAL  Final  Culture, OB Urine     Status: None   Collection Time: 09/10/22 10:28 AM   Specimen: Urine, Random  Result Value Ref Range Status   Specimen Description URINE, RANDOM  Final   Special Requests NONE  Final   Culture   Final    NO GROWTH NO GROUP B STREP (S.AGALACTIAE) ISOLATED Performed at [facility], [address], [place], [place] [address]    Report Status 09/11/2022 FINAL  Final  Culture, blood (Routine X 2) w Reflex to ID Panel     Status: None (Preliminary result)   Collection Time: 09/10/22 10:47 AM   Specimen: BLOOD RIGHT HAND  Result Value Ref Range Status   Specimen Description BLOOD RIGHT HAND  Final   Special Requests   Final    BOTTLES DRAWN AEROBIC AND ANAEROBIC Blood Culture adequate volume   Culture   Final    NO GROWTH 2 DAYS Performed at [facility], [address] [place], [place] [address]    Report Status PENDING  Incomplete  Culture, blood (Routine X 2) w Reflex to ID Panel     Status: None (Preliminary result)   Collection Time: 09/10/22 10:50 AM   Specimen: BLOOD LEFT HAND  Result Value Ref Range Status   Specimen Description BLOOD  LEFT HAND  Final   Special Requests   Final    BOTTLES DRAWN AEROBIC AND ANAEROBIC Blood Culture adequate volume   Culture   Final    NO GROWTH 2  DAYS Performed at [facility], [address] [place], [place] [address]    Report Status PENDING  Incomplete    Imaging: If present, new imagings (plain films, ct scans, and mri) have been personally visualized and interpreted; radiology reports have been reviewed. Decision making incorporated into the Impression / Recommendations.   Champ Mungo, DO Internal Medicine PGY-3 09/12/2022, 8:35 AM

## 2022-09-13 ENCOUNTER — Other Ambulatory Visit: Payer: Self-pay

## 2022-09-13 DIAGNOSIS — G9340 Encephalopathy, unspecified: Secondary | ICD-10-CM | POA: Diagnosis not present

## 2022-09-13 DIAGNOSIS — R7881 Bacteremia: Secondary | ICD-10-CM | POA: Diagnosis not present

## 2022-09-13 DIAGNOSIS — B954 Other streptococcus as the cause of diseases classified elsewhere: Secondary | ICD-10-CM | POA: Diagnosis not present

## 2022-09-13 DIAGNOSIS — F0390 Unspecified dementia without behavioral disturbance: Secondary | ICD-10-CM | POA: Diagnosis not present

## 2022-09-13 DIAGNOSIS — Z452 Encounter for adjustment and management of vascular access device: Secondary | ICD-10-CM | POA: Diagnosis not present

## 2022-09-13 LAB — GLUCOSE, CAPILLARY
Glucose-Capillary: 121 mg/dL — ABNORMAL HIGH (ref 70–99)
Glucose-Capillary: 119 mg/dL — ABNORMAL HIGH (ref 70–99)
Glucose-Capillary: 248 mg/dL — ABNORMAL HIGH (ref 70–99)
Glucose-Capillary: 297 mg/dL — ABNORMAL HIGH (ref 70–99)

## 2022-09-13 LAB — CBC
HCT: 25.8 % — ABNORMAL LOW (ref 39.0–52.0)
Hemoglobin: 8.1 g/dL — ABNORMAL LOW (ref 13.0–17.0)
MCH: 29.3 pg (ref 26.0–34.0)
MCHC: 31.4 g/dL (ref 30.0–36.0)
MCV: 93.5 fL (ref 80.0–100.0)
Platelets: 222 10*3/uL (ref 150–400)
RBC: 2.76 MIL/uL — ABNORMAL LOW (ref 4.22–5.81)
RDW: 17.3 % — ABNORMAL HIGH (ref 11.5–15.5)
WBC: 6.7 10*3/uL (ref 4.0–10.5)
nRBC: 0 % (ref 0.0–0.2)

## 2022-09-13 LAB — BASIC METABOLIC PANEL
Anion gap: 11 (ref 5–15)
BUN: 14 mg/dL (ref 8–23)
CO2: 22 mmol/L (ref 22–32)
Calcium: 7.9 mg/dL — ABNORMAL LOW (ref 8.9–10.3)
Chloride: 105 mmol/L (ref 98–111)
Creatinine, Ser: 1.17 mg/dL (ref 0.61–1.24)
GFR, Estimated: >60 mL/min (ref >60)
Glucose, Bld: 177 mg/dL — ABNORMAL HIGH (ref 70–99)
Potassium: 3.2 mmol/L — ABNORMAL LOW (ref 3.5–5.1)
Sodium: 138 mmol/L (ref 135–145)

## 2022-09-13 LAB — VANCOMYCIN, TROUGH: Vancomycin Tr: 14 ug/mL — ABNORMAL LOW (ref 15–20)

## 2022-09-13 LAB — VANCOMYCIN, RANDOM: Vancomycin Rm: 36 ug/mL

## 2022-09-13 MED ORDER — VANCOMYCIN HCL 1500 MG/300ML IV SOLN
1500.0000 mg | INTRAVENOUS | Status: DC
  Administered 2022-09-14 – 2022-09-16 (×3): 1500 mg via INTRAVENOUS
  Filled 2022-09-13 (×4): qty 300

## 2022-09-13 MED ORDER — GLUCERNA SHAKE PO LIQD
237.0000 mL | Freq: Three times a day (TID) | ORAL | Status: DC
  Administered 2022-09-13 – 2022-09-16 (×11): 237 mL via ORAL

## 2022-09-13 MED ORDER — POTASSIUM CHLORIDE 20 MEQ PO PACK
40.0000 meq | PACK | Freq: Two times a day (BID) | ORAL | Status: AC
  Administered 2022-09-13 (×2): 40 meq via ORAL
  Filled 2022-09-13 (×2): qty 2

## 2022-09-13 MED ORDER — RENA-VITE PO TABS
1.0000 | ORAL_TABLET | Freq: Every day | ORAL | Status: DC
  Administered 2022-09-13 – 2022-09-16 (×4): 1 via ORAL
  Filled 2022-09-13 (×4): qty 1

## 2022-09-13 MED ORDER — POTASSIUM CHLORIDE 20 MEQ PO PACK: 40.0000 meq | PACK | Freq: Two times a day (BID) | ORAL | Status: DC

## 2022-09-13 MED ORDER — PANTOPRAZOLE SODIUM 40 MG IV SOLR
40.0000 mg | Freq: Two times a day (BID) | INTRAVENOUS | Status: DC
  Administered 2022-09-14 – 2022-09-17 (×7): 40 mg via INTRAVENOUS
  Filled 2022-09-13 (×7): qty 10

## 2022-09-13 NOTE — Progress Notes (Signed)
[facility]  Date of Admission:  09/06/2022     CC: AMS - now positive for granulicatella adiacens bacteremia  Lines: PIV x 1  Antibiotics: Vancomycin 1750 mg IV q24h (07/04-present) Previously: cefepime 07/04-07/08 and metronidazole 07/04-07/08  Assessment: Granulicatella adiacens bacteremia: Blood culture from 07/04 with 1/4 bottles positive for Granulicatella adiacens. Repeat blood culture collected 07/08 shows NGTD. Remains afebrile, HDS with mild hypertension. TTE 07/09 without evidence of valvular vegetations. In discussion re: TEE, will update team about mental status today to aide in decision making. Currently on IV vancomycin. 2. Altered mental status: He is lethargic but alert this morning. Acknowledges requests but does not wish to follow them. Suspect he waxes/wanes in respect to his baseline given history of dementia and probable component of hospital delirium in addition to recovery from sepsis with systemic illness. Is being treated for bacteremia, and repeat urine culture shows NGTD. Per primary.  3. C/f GIB: Per primary, GI following. Hgb stable. 4. Hx dementia   Plan: Continue vancomycin 1750 mg IV q24h, duration pending final decision re: TEE Follow repeat blood cultures, urine culture.    Principal Problem:   Acute encephalopathy Active Problems:   Type 2 diabetes mellitus with stage 3 chronic kidney disease, with long-term current use of insulin (HCC)   Essential hypertension   PAD (peripheral artery disease) (HCC)   S/P peripheral artery angioplasty   Acute renal failure superimposed on stage 3a chronic kidney disease (HCC)   Lewy body dementia (HCC)   History of CVA (cerebrovascular accident)   Sepsis (HCC)   Normocytic anemia   Hyperlipidemia   Dementia (HCC)   GI bleed   Heme positive stool   Acute on chronic anemia   Antiplatelet or antithrombotic long-term use   Anemia   Folate deficiency    Bacteremia   Allergies  Allergen Reactions   Lipitor [Atorvastatin] Other (See Comments)    Myalgias   Statins Other (See Comments)    Myalgias  Pt tolerating rosuvastatin 5mg  QD   Pravachol [Pravastatin] Rash    Scheduled Meds:  carvedilol  12.5 mg Oral BID WC   Chlorhexidine Gluconate Cloth  6 each Topical Daily   folic acid  1 mg Oral Daily   insulin aspart  0-5 Units Subcutaneous QHS   insulin aspart  0-9 Units Subcutaneous TID WC   pantoprazole  40 mg Oral BID   potassium chloride  40 mEq Oral BID   pyridostigmine  30 mg Oral Q8H   rosuvastatin  5 mg Oral QHS   sertraline  25 mg Oral Daily   sodium chloride flush  3 mL Intravenous Q12H   tamsulosin  0.4 mg Oral Daily   Continuous Infusions:  vancomycin Stopped (09/12/22 1527)   PRN Meds:.acetaminophen **OR** acetaminophen, food thickener, hydrALAZINE, polyethylene glycol   SUBJECTIVE: NEON.   OBJECTIVE: Vitals:   09/12/22 2300 09/13/22 0400 09/13/22 0526 09/13/22 0740  BP: (!) 170/70 (!) 166/66 (!) 149/64 (!) 154/66  Pulse: 73  72 72  Resp: 15 17 17 20   Temp: 98.7 F (37.1 C) 98.8 F (37.1 C) 98.6 F (37 C) 98.8 F (37.1 C)  TempSrc: Oral Oral Oral Axillary  SpO2: 95% 98% 94% 96%  Weight:   87.8 kg   Height:       Body mass index is 29.43 kg/m.  Physical Exam: Constitutional:Elderly gentleman sleeping in bed. In no acute distress. Cardio:Regular rate and rhythm.  Pulm:Normal work of breathing on  room air. WUJ:[id number] for extremity edema. No pain over bilateral knee and shoulder joints. R hand with minimal non-pitting edema, not painful. Skin:Warm and dry. No rashes or lesions. Neuro:Lethargic but alert. Gives yes/no answers to some questions, will not cooperate with exam because he is tired and trying to sleep.  Lab Results Lab Results  Component Value Date   WBC 6.7 09/13/2022   HGB 8.1 (L) 09/13/2022   HCT 25.8 (L) 09/13/2022   MCV 93.5 09/13/2022   PLT 222 09/13/2022    Lab Results   Component Value Date   CREATININE 1.17 09/13/2022   BUN 14 09/13/2022   NA 138 09/13/2022   K 3.2 (L) 09/13/2022   CL 105 09/13/2022   CO2 22 09/13/2022    Lab Results  Component Value Date   ALT 24 09/09/2022   AST 31 09/09/2022   ALKPHOS 61 09/09/2022   BILITOT 1.0 09/09/2022     Microbiology: Recent Results (from the past 240 hour(s))  Blood Culture (routine x 2)     Status: Abnormal   Collection Time: 09/06/22 10:01 AM   Specimen: BLOOD RIGHT FOREARM  Result Value Ref Range Status   Specimen Description BLOOD RIGHT FOREARM  Final   Special Requests   Final    BOTTLES DRAWN AEROBIC AND ANAEROBIC Blood Culture adequate volume   Culture  Setup Time   Final    GRAM POSITIVE COCCI ANAEROBIC BOTTLE ONLY CRITICAL RESULT CALLED TO, READ BACK BY AND VERIFIED WITH: PHARMD K. AHMEND [id number] @2100  FH    Culture (A)  Final    GRANULICATELLA ADIACENS Standardized susceptibility testing for this organism is not available. Performed at [facility], [address] [place], [place] [address]    Report Status 09/10/2022 FINAL  Final  Blood Culture ID Panel (Reflexed)     Status: None   Collection Time: 09/06/22 10:01 AM  Result Value Ref Range Status   Enterococcus faecalis NOT DETECTED NOT DETECTED Final   Enterococcus Faecium NOT DETECTED NOT DETECTED Final   Listeria monocytogenes NOT DETECTED NOT DETECTED Final   Staphylococcus species NOT DETECTED NOT DETECTED Final   Staphylococcus aureus (BCID) NOT DETECTED NOT DETECTED Final   Staphylococcus epidermidis NOT DETECTED NOT DETECTED Final   Staphylococcus lugdunensis NOT DETECTED NOT DETECTED Final   Streptococcus species NOT DETECTED NOT DETECTED Final   Streptococcus agalactiae NOT DETECTED NOT DETECTED Final   Streptococcus pneumoniae NOT DETECTED NOT DETECTED Final   Streptococcus pyogenes NOT DETECTED NOT DETECTED Final   A.calcoaceticus-baumannii NOT DETECTED NOT DETECTED Final   Bacteroides fragilis NOT  DETECTED NOT DETECTED Final   Enterobacterales NOT DETECTED NOT DETECTED Final   Enterobacter cloacae complex NOT DETECTED NOT DETECTED Final   Escherichia coli NOT DETECTED NOT DETECTED Final   Klebsiella aerogenes NOT DETECTED NOT DETECTED Final   Klebsiella oxytoca NOT DETECTED NOT DETECTED Final   Klebsiella pneumoniae NOT DETECTED NOT DETECTED Final   Proteus species NOT DETECTED NOT DETECTED Final   Salmonella species NOT DETECTED NOT DETECTED Final   Serratia marcescens NOT DETECTED NOT DETECTED Final   Haemophilus influenzae NOT DETECTED NOT DETECTED Final   Neisseria meningitidis NOT DETECTED NOT DETECTED Final   Pseudomonas aeruginosa NOT DETECTED NOT DETECTED Final   Stenotrophomonas maltophilia NOT DETECTED NOT DETECTED Final   Candida albicans NOT DETECTED NOT DETECTED Final   Candida auris NOT DETECTED NOT DETECTED Final   Candida glabrata NOT DETECTED NOT DETECTED Final   Candida krusei NOT DETECTED NOT DETECTED  Final   Candida parapsilosis NOT DETECTED NOT DETECTED Final   Candida tropicalis NOT DETECTED NOT DETECTED Final   Cryptococcus neoformans/gattii NOT DETECTED NOT DETECTED Final    Comment: Performed at [facility], [address], [place], [place] [address]  SARS Coronavirus 2 by RT PCR (hospital order, performed in [facility] hospital lab) *cepheid single result test* Anterior Nasal Swab     Status: None   Collection Time: 09/06/22 10:02 AM   Specimen: Anterior Nasal Swab  Result Value Ref Range Status   SARS Coronavirus 2 by RT PCR NEGATIVE NEGATIVE Final    Comment: Performed at [facility], [address] [place], [place] [address]  Urine Culture     Status: Abnormal   Collection Time: 09/06/22 10:03 AM   Specimen: Urine, Catheterized  Result Value Ref Range Status   Specimen Description URINE, CATHETERIZED  Final   Special Requests   Final    NONE Performed at [facility], [address] [place], [place] [address]    Culture  MULTIPLE SPECIES PRESENT, SUGGEST RECOLLECTION (A)  Final   Report Status 09/07/2022 FINAL  Final  Blood Culture (routine x 2)     Status: None   Collection Time: 09/06/22 10:06 AM   Specimen: BLOOD  Result Value Ref Range Status   Specimen Description BLOOD SITE NOT SPECIFIED  Final   Special Requests   Final    BOTTLES DRAWN AEROBIC AND ANAEROBIC Blood Culture adequate volume   Culture   Final    NO GROWTH 5 DAYS Performed at [facility], [address], [place], [place] [address]    Report Status 09/11/2022 FINAL  Final  Culture, OB Urine     Status: None   Collection Time: 09/10/22 10:28 AM   Specimen: Urine, Random  Result Value Ref Range Status   Specimen Description URINE, RANDOM  Final   Special Requests NONE  Final   Culture   Final    NO GROWTH NO GROUP B STREP (S.AGALACTIAE) ISOLATED Performed at [facility], [address] [place], [place] [address]    Report Status 09/11/2022 FINAL  Final  Culture, blood (Routine X 2) w Reflex to ID Panel     Status: None (Preliminary result)   Collection Time: 09/10/22 10:47 AM   Specimen: BLOOD RIGHT HAND  Result Value Ref Range Status   Specimen Description BLOOD RIGHT HAND  Final   Special Requests   Final    BOTTLES DRAWN AEROBIC AND ANAEROBIC Blood Culture adequate volume   Culture   Final    NO GROWTH 3 DAYS Performed at [facility], [address], [place], [place] [address]    Report Status PENDING  Incomplete  Culture, blood (Routine X 2) w Reflex to ID Panel     Status: None (Preliminary result)   Collection Time: 09/10/22 10:50 AM   Specimen: BLOOD LEFT HAND  Result Value Ref Range Status   Specimen Description BLOOD LEFT HAND  Final   Special Requests   Final    BOTTLES DRAWN AEROBIC AND ANAEROBIC Blood Culture adequate volume   Culture   Final    NO GROWTH 3 DAYS Performed at [facility], [address], [place], [place] [address]    Report Status PENDING  Incomplete     Imaging: If present, new imagings (plain films, ct scans, and mri) have been personally visualized and interpreted; radiology reports have been  reviewed. Decision making incorporated into the Impression / Recommendations.   Champ Mungo, DO Internal Medicine PGY-3 09/13/2022, 9:08 AM

## 2022-09-13 NOTE — Progress Notes (Signed)
Pt lethargic this morning - MD at bedside.  Unable to administer PO medications.  Pt stating "no" when asked by RN/NT if he wants to eat breakfast and take medications.  Will continue to encourage oral intake and will re-attempt medication administration if pt becomes more alert.

## 2022-09-13 NOTE — Progress Notes (Signed)
Diagnosis: Granulicatella bsi  Tee not possible. Treating as presumed IE   OPAT Orders Discharge antibiotics to be given via PICC line Discharge antibiotics: Vancomycin  Aim for Vancomycin trough 15-20 or AUC 400-550 (unless otherwise indicated)   Duration: 6 weeks from 7/08  End Date: 8/19  [facility] Care Per Protocol:  Home health RN for IV administration and teaching; PICC line care and labs.    Labs weekly while on IV antibiotics: _x_ CBC with differential __ BMP _x_ CMP __ CRP __ ESR __ Vancomycin trough __ CK  _x_ Please pull PIC at completion of IV antibiotics __ Please leave PIC in place until doctor has seen patient or been notified  Fax weekly labs to [phone]  Clinic Follow Up Appt: 8/09 @ 1030 am  @  [facility] [address], [place], [place] [address] Phone: [phone]

## 2022-09-13 NOTE — Progress Notes (Signed)
Speech Language Pathology Treatment: Dysphagia  Patient Details Name: Harry [name] MRN: [id number] DOB: 07/20/1945 Today's Date: 09/13/2022 Time: 4098-1191 SLP Time Calculation (min) (ACUTE ONLY): 23 min  Assessment / Plan / Recommendation Clinical Impression  Pt seen for ongoing dysphagia management.  Per RN, pt has been very lethargic today and she was unable to give morning medications.  Pt able to rouse and was repositioned in bed with help of NT to achieve upright positioning for PO intake.  With repositioning there was very wet cough.  Pt refused suction to help clear.  SLP provided oral care prior to PO trials.  Pt resistant to oral care.  Pt accepted thin liquid x2 with wet breath sounds noted.  There was mild anterior loss of thin liquids v decreased secretion management. Pt exhibited good oral clearance of puree. With pt awake/alert, RN brought in AM meds to give.  Pt was able to swallow small pill whole with puree x1 after prolonged oral phase, following by multiple sips of NTL for liquid was, and presumed mastication of pill (pt would not allow visualization of oral cavity).  With additional trial of pill whole in puree, pt was unable to transit pill orally.  It remained in front of teeth at labial commissure.  SLP removed pill and tried to replace more posteriorly in oral cavity, but pt exhibited tongue thrusting preventing spoon from reaching dorsal lingual surface.  Pill was again removed and it was crushed with remaining crushable medications in puree.  Pt exhibited intermittent wet coughing throughout trials today. SLP attempted to suction during these episodes without much success. Pt declined further trials after medication administration.  Given audible aspiration noted on MBSS 7/8, question if swallow function has declined since this this evaluation.  Pt may be having difficulty with secretion management as well.  Symptoms observed today may be related to lethargy.  Pt was able to  rouse, but RN reports decreased LOA this morning.  SLP will continue to follow for diet tolerance and possible advancement.   Recommend pureed (D1) diet with nectar thick liquids.      HPI HPI: Harry [name] is a 77 y.o. male with medical history significant of Lewy body dementia, diabetes type 2, CKD 3A, anemia, hypertension, hyperlipidemia, syncope, peripheral arterial disease status post angioplasty and stenting, depression, glaucoma presenting with altered mental status. CTn Chest 09/08/22: IMPRESSION: 1. Bilateral lower lobe bronchial wall thickening with ground-glass attenuation and thickening of the peribronchovascular interstitium. Findings are concerning for bronchopneumonia. Bedside swallow eval 3/31 of this year - dysphagia 3/thins was recommended.      SLP Plan  Continue with current plan of care      Recommendations for follow up therapy are one component of a multi-disciplinary discharge planning process, led by the attending physician.  Recommendations may be updated based on patient status, additional functional criteria and insurance authorization.    Recommendations  Diet recommendations: Dysphagia 1 (puree);Nectar-thick liquid Liquids provided via: Cup;Straw Medication Administration: Crushed with puree Supervision: Trained caregiver to feed patient Compensations: Minimize environmental distractions;Other (Comment);Slow rate;Small sips/bites;Follow solids with liquid (Single sips) Postural Changes and/or Swallow Maneuvers: Seated upright 90 degrees                  Oral care BID   Frequent or constant Supervision/Assistance Dysphagia, oropharyngeal phase (R13.12)     Continue with current plan of care     Kerrie Pleasure, MA, CCC-SLP Acute Rehabilitation Services Office: [phone] 09/13/2022, 11:07 AM

## 2022-09-13 NOTE — Progress Notes (Signed)
Daughter, Harry [name], updated on pt status this morning.  No changes overnight per shift report.  Will continue to monitor pt and update family as needed.

## 2022-09-13 NOTE — Progress Notes (Signed)
Occupational Therapy Treatment Patient Details Name: Harry [name] MRN: [id number] DOB: [birth year] Today's Date: 09/13/2022   History of present illness Pt is a 77 y.o. male presenting 09/06/22 with AMS. MRI showed no acute intracranial abnormality. Admitted with sepsis and acute encephalopathy from bronchopneumonia. PMH includes: HTN, HLD, DM II, CVA, PVD, CKD IIIA, syncope, glaucoma, and dementia.   OT comments  Pt sleeping upon therapy arrival. Unable to wake pt up using verbal, tactile, and auditory cueing. Noted increased swelling localized in right hand. Assessed ROM and completed edema massage after washing hands with soapy washcloth. Pt intermittently demonstrated arousal with any movement or mobility of RUE. Resistive to any movement. Max VC and positional techniques used to decrease resistance. Good response noted to edema management techniques. Placed RUE under pillow to elevate UE and decrease edema. Pt became agitated when arm was lifted and with eyes closed stated, "Leave that alone!" Pt demonstrated no understanding of education provided during session. OT will continue to follow patient acutely.    Recommendations for follow up therapy are one component of a multi-disciplinary discharge planning process, led by the attending physician.  Recommendations may be updated based on patient status, additional functional criteria and insurance authorization.    Assistance Recommended at Discharge Frequent or constant Supervision/Assistance  Patient can return home with the following  Two people to help with walking and/or transfers;Assistance with cooking/housework;Direct supervision/assist for medications management;Direct supervision/assist for financial management;Assist for transportation;Help with stairs or ramp for entrance;Two people to help with bathing/dressing/bathroom   Equipment Recommendations  Other (comment) (defer to next venue of care)       Precautions / Restrictions  Precautions Precautions: Fall Precaution Comments: dementia Restrictions Weight Bearing Restrictions: No       Mobility Bed Mobility Overal bed mobility:  (refused mobility)    Patient Response:  (resistive)  Transfers Overall transfer level:  (Not attempted d/t safety concerns)                ADL either performed or assessed with clinical judgement    Extremity/Trunk Assessment Upper Extremity Assessment Upper Extremity Assessment: RUE deficits/detail RUE Deficits / Details: Noted increased swelling localized in hand with no known reason.                      Cognition Arousal/Alertness: Lethargic Behavior During Therapy: Flat affect, Agitated Overall Cognitive Status: History of cognitive impairments - at baseline      General Comments: Followed no commands this session. Unable to wake up and participate although demonstrated resistance when RUE was mobilized.        Exercises Other Exercises Other Exercises: RUE rebounding performed to decrease muscle tension and resistance to mobilization. Other Exercises: Edema massage performed to RUE to decrease swelling and increase ability to utilized UE during self care tasks. Other Exercises: P/ROM, right hand, composite finger flexion/extension, wrist radial/ulnar deviation, supination/pronation, elbow flexion/extension, 5X, 1 set, supine.            Pertinent Vitals/ Pain       Pain Assessment Pain Assessment: Faces Faces Pain Scale: No hurt         Frequency  Min 1X/week        Progress Toward Goals  OT Goals(current goals can now be found in the care plan section)  Progress towards OT goals: Not progressing toward goals - comment (level of arousal limiting pt's ability to progress in therapy this date)     Plan Discharge plan remains appropriate;Frequency  remains appropriate       AM-PAC OT "6 Clicks" Daily Activity     Outcome Measure   Help from another person eating meals?:  Total Help from another person taking care of personal grooming?: Total Help from another person toileting, which includes using toliet, bedpan, or urinal?: Total Help from another person bathing (including washing, rinsing, drying)?: Total Help from another person to put on and taking off regular upper body clothing?: Total Help from another person to put on and taking off regular lower body clothing?: Total 6 Click Score: 6    End of Session    OT Visit Diagnosis: Unsteadiness on feet (R26.81);Other abnormalities of gait and mobility (R26.89);Muscle weakness (generalized) (M62.81)   Activity Tolerance Treatment limited secondary to agitation;Patient limited by lethargy   Patient Left in bed;with bed alarm set;with call bell/phone within reach           Time: 1354-1408 OT Time Calculation (min): 14 min  Charges: OT General Charges $OT Visit: 1 Visit OT Treatments $Therapeutic Exercise: 8-22 mins  Limmie Patricia, OTR/L,CBIS  Supplemental OT - [facility] and [facility] Secure Chat Preferred    Wing Gfeller, Charisse March 09/13/2022, 2:16 PM

## 2022-09-13 NOTE — Progress Notes (Signed)
PROGRESS NOTE  Harry [name]  ZOX:[id number] DOB: 07/01/1945 DOA: 09/06/2022 PCP: System, Provider Not In   Brief Narrative: Patient is 77 year old male with with history of advanced dementia, CKD stage III, Type II, anemia, hypertension, hyperlipidemia, peripheral vascular disease, depression who presented with altered mental status.  On presentation,he was found to be dehydrated, altered, lab work showed AKI.  Hospital course remarkable for finding of bacteremia with  GRANULICATELLA ADIACENS .  ID consulted.  Started on vancomycin.  Cardiology also consulted for consideration of TEE  for bacteremia which has not been done yet.  MRI, CT imagings of the brain were negative for acute findings.  PT/OT recommending skilled facility on discharge  Assessment & Plan:  Principal Problem:   Acute encephalopathy Active Problems:   Type 2 diabetes mellitus with stage 3 chronic kidney disease, with long-term current use of insulin (HCC)   Essential hypertension   PAD (peripheral artery disease) (HCC)   S/P peripheral artery angioplasty   Acute renal failure superimposed on stage 3a chronic kidney disease (HCC)   Lewy body dementia (HCC)   History of CVA (cerebrovascular accident)   Sepsis (HCC)   Normocytic anemia   Hyperlipidemia   Dementia (HCC)   GI bleed   Heme positive stool   Acute on chronic anemia   Antiplatelet or antithrombotic long-term use   Anemia   Folate deficiency   Bacteremia  Granulicatella bacteremia : Sepsis physiology has resolved.  Currently on IV vancomycin.  ID following.  Plan for TEE to rule out endocarditis but due to mentation, not done yet.  Cardiology following.  ID recommending 6 weeks of IV vancomycin if IE not ruled out.  If IE is ruled out, plan for 10 days of vancomycin. Cardiology will reevaluate for TEE  Suspected pneumonia: Currently on room air.  Already on antibiotics. CT imagings showed bilateral lower lobe bronchial wall thickening with  ground-glass attenuation and thickening of the peribronchovascular interstitium,concerning for bronchopneumonia.Also showed  1 cm right upper lobe pulmonary nodule, increased in size from 6 mm on 06/02/2022. We recommend outpatient follow up with pulmonary medicine or thoracic surgery after dc  Advanced dementia: History of Lewy body dementia.  Presented with more confusion from baseline, altered mentation.  Continue supportive care, delirium precautions. Also has history of CVA/peripheral vascular disease.  Was on aspirin and Plavix at home which are on hold. CT/MRI of the brain negative for acute findings.  On Mestinon at home  Diabetes type 2: On insulin at home.  Currently on sliding scale.  Monitor blood sugars  Normocytic anemia: No evidence of active bleeding.  GI was following but has signed off.  Continue to monitor hemoglobin intermittently.  FOBT was positive  AKI on CKD stage IIIa:   Presented with creatinine in the range of 2.4.  Currently kidney function  at baseline. Patient found to have retained urine on presentation, Foley placed.  Given voiding trial.  He is passing urine without any problem  Deconditioning/debility: PT/OT recommending skilled nursing facility on discharge.  TOC consulted.  Goals of care: Elderly patient with multiple comorbidities.  Palliative care consulted for goals of care but deferred by family with intention of full scope of treatment       DVT prophylaxis:SCDs Start: 09/06/22 1341     Code Status: Full Code  Family Communication: Called and discussed with daughter on phone on 7/11  Patient status:Inpatient  Patient is from :home  Anticipated discharge to:SNF  Estimated DC date:2-3 days, waiting for TEE/ID  recommendation on antibiotic   Consultants: ID, cardiology, GI  Procedures: None  Antimicrobials:  Anti-infectives (From admission, onward)    Start     Dose/Rate Route Frequency Ordered Stop   09/10/22 1200  vancomycin  (VANCOREADY) IVPB 1750 mg/350 mL        1,750 mg 175 mL/hr over 120 Minutes Intravenous Every 24 hours 09/10/22 0953     09/10/22 1200  ceFEPIme (MAXIPIME) 2 g in sodium chloride 0.9 % 100 mL IVPB  Status:  Discontinued        2 g 200 mL/hr over 30 Minutes Intravenous Every 8 hours 09/10/22 0953 09/10/22 1027   09/08/22 1315  ceFEPIme (MAXIPIME) 2 g in sodium chloride 0.9 % 100 mL IVPB  Status:  Discontinued        2 g 200 mL/hr over 30 Minutes Intravenous Every 12 hours 09/08/22 1304 09/10/22 0953   09/08/22 1300  vancomycin (VANCOREADY) IVPB 1500 mg/300 mL  Status:  Discontinued        1,500 mg 150 mL/hr over 120 Minutes Intravenous Every 24 hours 09/08/22 1212 09/10/22 0953   09/07/22 1400  ceFEPIme (MAXIPIME) 2 g in sodium chloride 0.9 % 100 mL IVPB  Status:  Discontinued        2 g 200 mL/hr over 30 Minutes Intravenous Every 24 hours 09/06/22 1250 09/08/22 1304   09/07/22 1230  vancomycin (VANCOREADY) IVPB 750 mg/150 mL  Status:  Discontinued        750 mg 150 mL/hr over 60 Minutes Intravenous Every 24 hours 09/07/22 1139 09/08/22 1212   09/06/22 2200  metroNIDAZOLE (FLAGYL) IVPB 500 mg  Status:  Discontinued        500 mg 100 mL/hr over 60 Minutes Intravenous Every 12 hours 09/06/22 1344 09/10/22 1027   09/06/22 1250  vancomycin variable dose per unstable renal function (pharmacist dosing)  Status:  Discontinued         Does not apply See admin instructions 09/06/22 1250 09/07/22 1159   09/06/22 1015  metroNIDAZOLE (FLAGYL) IVPB 500 mg        500 mg 100 mL/hr over 60 Minutes Intravenous  Once 09/06/22 1002 09/06/22 1216   09/06/22 1015  ceFEPIme (MAXIPIME) 2 g in sodium chloride 0.9 % 100 mL IVPB        2 g 200 mL/hr over 30 Minutes Intravenous  Once 09/06/22 1010 09/06/22 1133   09/06/22 1015  vancomycin (VANCOREADY) IVPB 1500 mg/300 mL        1,500 mg 150 mL/hr over 120 Minutes Intravenous  Once 09/06/22 1010 09/06/22 1436       Subjective: Patient seen and examined the  bedside today.  Remains overall comfortable, confused which is his baseline.  Not agitated.  Lying in bed.  Not in any Distress  Objective: Vitals:   09/13/22 0400 09/13/22 0526 09/13/22 0740 09/13/22 1202  BP: (!) 166/66 (!) 149/64 (!) 154/66 (!) 160/77  Pulse:  72 72 71  Resp: 17 17 20 11   Temp: 98.8 F (37.1 C) 98.6 F (37 C) 98.8 F (37.1 C) 98.7 F (37.1 C)  TempSrc: Oral Oral Axillary Axillary  SpO2: 98% 94% 96% 96%  Weight:  87.8 kg    Height:        Intake/Output Summary (Last 24 hours) at 09/13/2022 1307 Last data filed at 09/13/2022 0944 Gross per 24 hour  Intake 700 ml  Output 551 ml  Net 149 ml   Filed Weights   09/11/22 0500 09/12/22 0500 09/13/22  1610  Weight: 84.7 kg 82.1 kg 87.8 kg    Examination:  General exam: Overall comfortable, not in distress,deconditioned,confused HEENT: cataract on left eye Respiratory system:  no wheezes or crackles  Cardiovascular system: S1 & S2 heard, RRR.  Gastrointestinal system: Abdomen is nondistended, soft and nontender. Central nervous system: awake but not oriented,left hemiparesis Extremities: No edema, no clubbing ,no cyanosis Skin: No rashes, no ulcers,no icterus     Data Reviewed: I have personally reviewed following labs and imaging studies  CBC: Recent Labs  Lab 09/08/22 0715 09/08/22 1808 09/10/22 0134 09/11/22 0109 09/13/22 0251  WBC 18.2* 16.1* 9.4 5.3 6.7  NEUTROABS 15.5* 14.0* 6.6 2.5  --   HGB 8.8* 8.5* 8.8* 8.2* 8.1*  HCT 28.3* 26.8* 27.2* 25.9* 25.8*  MCV 97.3 95.4 95.8 92.5 93.5  PLT 150 155 156 168 222   Basic Metabolic Panel: Recent Labs  Lab 09/07/22 0131 09/08/22 0715 09/09/22 0125 09/10/22 0134 09/11/22 0109 09/13/22 0251  NA 140 144 139 140 139 138  K 3.9 3.3* 3.2* 3.5 3.5 3.2*  CL 110 109 113* 110 110 105  CO2 20* 21* 20* 23 21* 22  GLUCOSE 143* 160* 235* 196* 258* 177*  BUN 33* 28* 18 14 18 14   CREATININE 1.98* 1.00 1.12 0.92 1.00 1.17  CALCIUM 7.5* 7.6* 7.2* 7.6* 7.7*  7.9*  MG 1.8  --   --  1.5* 2.0  --   PHOS  --   --   --  1.5* 3.2  --      Recent Results (from the past 240 hour(s))  Blood Culture (routine x 2)     Status: Abnormal   Collection Time: 09/06/22 10:01 AM   Specimen: BLOOD RIGHT FOREARM  Result Value Ref Range Status   Specimen Description BLOOD RIGHT FOREARM  Final   Special Requests   Final    BOTTLES DRAWN AEROBIC AND ANAEROBIC Blood Culture adequate volume   Culture  Setup Time   Final    GRAM POSITIVE COCCI ANAEROBIC BOTTLE ONLY CRITICAL RESULT CALLED TO, READ BACK BY AND VERIFIED WITH: PHARMD K. AHMEND [id number] @2100  FH    Culture (A)  Final    GRANULICATELLA ADIACENS Standardized susceptibility testing for this organism is not available. Performed at [facility], [address] [place], [place] [address]    Report Status 09/10/2022 FINAL  Final  Blood Culture ID Panel (Reflexed)     Status: None   Collection Time: 09/06/22 10:01 AM  Result Value Ref Range Status   Enterococcus faecalis NOT DETECTED NOT DETECTED Final   Enterococcus Faecium NOT DETECTED NOT DETECTED Final   Listeria monocytogenes NOT DETECTED NOT DETECTED Final   Staphylococcus species NOT DETECTED NOT DETECTED Final   Staphylococcus aureus (BCID) NOT DETECTED NOT DETECTED Final   Staphylococcus epidermidis NOT DETECTED NOT DETECTED Final   Staphylococcus lugdunensis NOT DETECTED NOT DETECTED Final   Streptococcus species NOT DETECTED NOT DETECTED Final   Streptococcus agalactiae NOT DETECTED NOT DETECTED Final   Streptococcus pneumoniae NOT DETECTED NOT DETECTED Final   Streptococcus pyogenes NOT DETECTED NOT DETECTED Final   A.calcoaceticus-baumannii NOT DETECTED NOT DETECTED Final   Bacteroides fragilis NOT DETECTED NOT DETECTED Final   Enterobacterales NOT DETECTED NOT DETECTED Final   Enterobacter cloacae complex NOT DETECTED NOT DETECTED Final   Escherichia coli NOT DETECTED NOT DETECTED Final   Klebsiella aerogenes NOT DETECTED NOT  DETECTED Final   Klebsiella oxytoca NOT DETECTED NOT DETECTED Final   Klebsiella  pneumoniae NOT DETECTED NOT DETECTED Final   Proteus species NOT DETECTED NOT DETECTED Final   Salmonella species NOT DETECTED NOT DETECTED Final   Serratia marcescens NOT DETECTED NOT DETECTED Final   Haemophilus influenzae NOT DETECTED NOT DETECTED Final   Neisseria meningitidis NOT DETECTED NOT DETECTED Final   Pseudomonas aeruginosa NOT DETECTED NOT DETECTED Final   Stenotrophomonas maltophilia NOT DETECTED NOT DETECTED Final   Candida albicans NOT DETECTED NOT DETECTED Final   Candida auris NOT DETECTED NOT DETECTED Final   Candida glabrata NOT DETECTED NOT DETECTED Final   Candida krusei NOT DETECTED NOT DETECTED Final   Candida parapsilosis NOT DETECTED NOT DETECTED Final   Candida tropicalis NOT DETECTED NOT DETECTED Final   Cryptococcus neoformans/gattii NOT DETECTED NOT DETECTED Final    Comment: Performed at [facility], [address], [place], [place] [address]  SARS Coronavirus 2 by RT PCR (hospital order, performed in [facility] hospital lab) *cepheid single result test* Anterior Nasal Swab     Status: None   Collection Time: 09/06/22 10:02 AM   Specimen: Anterior Nasal Swab  Result Value Ref Range Status   SARS Coronavirus 2 by RT PCR NEGATIVE NEGATIVE Final    Comment: Performed at [facility], [address], [place], [place] [address]  Urine Culture     Status: Abnormal   Collection Time: 09/06/22 10:03 AM   Specimen: Urine, Catheterized  Result Value Ref Range Status   Specimen Description URINE, CATHETERIZED  Final   Special Requests   Final    NONE Performed at [facility], [address] [place], [place] [address]    Culture MULTIPLE SPECIES PRESENT, SUGGEST RECOLLECTION (A)  Final   Report Status 09/07/2022 FINAL  Final  Blood Culture (routine x 2)     Status: None   Collection Time: 09/06/22 10:06 AM   Specimen: BLOOD  Result Value Ref Range Status    Specimen Description BLOOD SITE NOT SPECIFIED  Final   Special Requests   Final    BOTTLES DRAWN AEROBIC AND ANAEROBIC Blood Culture adequate volume   Culture   Final    NO GROWTH 5 DAYS Performed at [facility], [address], [place], [place] [address]    Report Status 09/11/2022 FINAL  Final  Culture, OB Urine     Status: None   Collection Time: 09/10/22 10:28 AM   Specimen: Urine, Random  Result Value Ref Range Status   Specimen Description URINE, RANDOM  Final   Special Requests NONE  Final   Culture   Final    NO GROWTH NO GROUP B STREP (S.AGALACTIAE) ISOLATED Performed at [facility], [address], [place], [place] [address]    Report Status 09/11/2022 FINAL  Final  Culture, blood (Routine X 2) w Reflex to ID Panel     Status: None (Preliminary result)   Collection Time: 09/10/22 10:47 AM   Specimen: BLOOD RIGHT HAND  Result Value Ref Range Status   Specimen Description BLOOD RIGHT HAND  Final   Special Requests   Final    BOTTLES DRAWN AEROBIC AND ANAEROBIC Blood Culture adequate volume   Culture   Final    NO GROWTH 3 DAYS Performed at [facility], [address] [place], [place] [address]    Report Status PENDING  Incomplete  Culture, blood (Routine X 2) w Reflex to ID Panel     Status: None (Preliminary result)  Collection Time: 09/10/22 10:50 AM   Specimen: BLOOD LEFT HAND  Result Value Ref Range Status   Specimen Description BLOOD LEFT HAND  Final   Special Requests   Final    BOTTLES DRAWN AEROBIC AND ANAEROBIC Blood Culture adequate volume   Culture   Final    NO GROWTH 3 DAYS Performed at [facility], [address] [place], [place] [address]    Report Status PENDING  Incomplete     Radiology Studies: No results found.  Scheduled Meds:  carvedilol  12.5 mg Oral BID WC   folic acid  1 mg Oral Daily   insulin aspart  0-5 Units Subcutaneous QHS   insulin aspart  0-9 Units Subcutaneous TID WC   pantoprazole  40 mg Oral  BID   potassium chloride  40 mEq Oral BID   pyridostigmine  30 mg Oral Q8H   rosuvastatin  5 mg Oral QHS   sertraline  25 mg Oral Daily   sodium chloride flush  3 mL Intravenous Q12H   tamsulosin  0.4 mg Oral Daily   Continuous Infusions:  vancomycin Stopped (09/12/22 1527)     LOS: 7 days   Burnadette Pop, MD [facility] P7/01/2023, 1:07 PM

## 2022-09-13 NOTE — Progress Notes (Signed)
Initial Nutrition Assessment  DOCUMENTATION CODES:   Not applicable  INTERVENTION:  - Add Glucerna Shake po TID, each supplement provides 220 kcal and 10 grams of protein   - Add Renal MVI.   NUTRITION DIAGNOSIS:   Inadequate oral intake related to lethargy/confusion, poor appetite as evidenced by meal completion < 25%.  GOAL:   Patient will meet greater than or equal to 90% of their needs  MONITOR:   PO intake, Supplement acceptance  REASON FOR ASSESSMENT:   Malnutrition Screening Tool    ASSESSMENT:   77 y.o. admits related to AMS. PMH includes: bronchitis, stroke, pyelonephritis, AKI, CAP, CKD, HLD, HTN, PVD, stroke, T2DM. Pt is currently receiving medical management related to acute encephalopathy.  Meds reviewed: folic acid, sliding scale insulin, klor-con. Labs reviewed: K low.   Pt is disoriented x4. Pt ate 0% of his breakfast this am. Pt unable to provide any nutritional hx. No significant wt loss per record. Per EMR, pt's intakes have varied from 0-100%. However, most intakes are documented 50-100%. RD will add Glucerna TID for now. Will continue to monitor PO intakes.   NUTRITION - FOCUSED PHYSICAL EXAM:  Unable to assess - attempt at follow up.   Diet Order:   Diet Order             DIET - DYS 1 Room service appropriate? Yes; Fluid consistency: Nectar Thick  Diet effective now                   EDUCATION NEEDS:   Not appropriate for education at this time  Skin:  Skin Assessment: Reviewed RN Assessment  Last BM:  7/10 - type 6  Height:   Ht Readings from Last 1 Encounters:  09/10/22 5\' 8"  (1.727 m)    Weight:   Wt Readings from Last 1 Encounters:  09/13/22 87.8 kg    Ideal Body Weight:     BMI:  Body mass index is 29.43 kg/m.  Estimated Nutritional Needs:   Kcal:  2195-2630 kcals  Protein:  110-130 gm  Fluid:  >/= 2.1 L  Bethann Humble, RD, LDN, CNSC.

## 2022-09-13 NOTE — TOC Progression Note (Signed)
Transition of Care [facility]) - Progression Note    Patient Details  Name: Harry [name] MRN: [id number] Date of Birth: [birth year]  Transition of [facility]) CM/SW Contact  Eduard Roux, [place] Phone Number: 09/13/2022, 5:58 PM  Clinical Narrative:     CSW spoke with patient's daughter,[name]- she requested LTACH referral be sent to [facility]- CSW advised, TOC will send referral to [facility] tomorrow.   Antony Blackbird, MSW, LCSW Clinical Social Worker    Expected Discharge Plan: Skilled Nursing Facility Barriers to Discharge: Continued Medical Work up  Expected Discharge Plan and Services       Living arrangements for the past 2 months: Skilled Nursing Facility                                       Social Determinants of Health (SDOH) Interventions SDOH Screenings   Food Insecurity: No Food Insecurity (09/07/2022)  Housing: Patient Unable To Answer (09/07/2022)  Transportation Needs: No Transportation Needs (09/07/2022)  Utilities: Not At Risk (09/07/2022)  Depression (PHQ2-9): Low Risk  (03/01/2020)  Financial Resource Strain: Low Risk  (09/29/2018)  Physical Activity: Inactive (09/29/2018)  Tobacco Use: Medium Risk (09/06/2022)    Readmission Risk Interventions     No data to display

## 2022-09-13 NOTE — Progress Notes (Signed)
Pharmacy Antibiotic Note  Harry [name] is a 77 y.o. male admitted on 09/06/2022 from facility as unresponsive, hypotensive, concern for sepsis. CT abdomen concerning for bronchopneumonia. 1/4 BCx growing granulicatella adiacens. Pharmacy has been consulted for vancomycin and cefepime dosing. Also on Flagyl per MD.  Vanc peak came back at 36, trough at 14>>AUC 552. We will decrease slightly. Plan to treat as endocarditis.   Scr 1.17  Plan: Change vanc to 1.5g IV q24>>AUC 473 Repeat vanc levels as needed   Height: 5\' 8"  (172.7 cm) Weight: 87.8 kg (193 lb 9 oz) IBW/kg (Calculated) : 68.4  Temp (24hrs), Avg:98.5 F (36.9 C), Min:97.9 F (36.6 C), Max:98.8 F (37.1 C)  Recent Labs  Lab 09/07/22 1833 09/07/22 2126 09/08/22 0715 09/08/22 1808 09/09/22 0125 09/10/22 0134 09/11/22 0109 09/13/22 0251 09/13/22 1328 09/13/22 1747  WBC  --   --  18.2* 16.1*  --  9.4 5.3 6.7  --   --   CREATININE  --   --  1.00  --  1.12 0.92 1.00 1.17  --   --   LATICACIDVEN 1.6 1.5  --   --   --   --   --   --   --   --   VANCOTROUGH  --   --   --   --   --   --   --   --  14*  --   VANCORANDOM  --   --   --   --   --   --   --   --   --  36    Estimated Creatinine Clearance: 58.8 mL/min (by C-G formula based on SCr of 1.17 mg/dL).    Allergies  Allergen Reactions   Lipitor [Atorvastatin] Other (See Comments)    Myalgias   Statins Other (See Comments)    Myalgias  Pt tolerating rosuvastatin 5mg  QD   Pravachol [Pravastatin] Rash   Vancomycin 7/4 >>8/19 Cefepime 7/4 >>7/8 Metronidazole 7/4 >>7/8   7/4 Blood cx: 1/4 granulicatella adiacens 7/4 urine cx: mult sp, recollect 7/8 urine cx: negative 7/8 Bcx: NGTD   Harry [name], PharmD, BCIDP, AAHIVP, CPP Infectious Disease Pharmacist 09/13/2022 7:33 PM

## 2022-09-14 ENCOUNTER — Encounter (HOSPITAL_COMMUNITY)
Admission: EM | Disposition: A | Discharge status: Nursing Home (Includes ICF) | Payer: Self-pay | Source: Skilled Nursing Facility | Attending: Internal Medicine

## 2022-09-14 DIAGNOSIS — G9340 Encephalopathy, unspecified: Secondary | ICD-10-CM | POA: Diagnosis not present

## 2022-09-14 LAB — GLUCOSE, CAPILLARY
Glucose-Capillary: 163 mg/dL — ABNORMAL HIGH (ref 70–99)
Glucose-Capillary: 110 mg/dL — ABNORMAL HIGH (ref 70–99)
Glucose-Capillary: 253 mg/dL — ABNORMAL HIGH (ref 70–99)
Glucose-Capillary: 245 mg/dL — ABNORMAL HIGH (ref 70–99)

## 2022-09-14 LAB — BASIC METABOLIC PANEL
Anion gap: 6 (ref 5–15)
BUN: 12 mg/dL (ref 8–23)
CO2: 23 mmol/L (ref 22–32)
Calcium: 7.7 mg/dL — ABNORMAL LOW (ref 8.9–10.3)
Chloride: 107 mmol/L (ref 98–111)
Creatinine, Ser: 0.88 mg/dL (ref 0.61–1.24)
GFR, Estimated: >60 mL/min (ref >60)
Glucose, Bld: 227 mg/dL — ABNORMAL HIGH (ref 70–99)
Potassium: 3.7 mmol/L (ref 3.5–5.1)
Sodium: 136 mmol/L (ref 135–145)

## 2022-09-14 LAB — CULTURE, BLOOD (ROUTINE X 2): Culture: NO GROWTH

## 2022-09-14 SURGERY — ECHOCARDIOGRAM, TRANSESOPHAGEAL
Anesthesia: Monitor Anesthesia Care

## 2022-09-14 MED ORDER — CHLORHEXIDINE GLUCONATE CLOTH 2 % EX PADS
6.0000 | MEDICATED_PAD | Freq: Every day | CUTANEOUS | Status: DC
  Administered 2022-09-15 – 2022-09-16 (×2): 6 via TOPICAL

## 2022-09-14 MED ORDER — SODIUM CHLORIDE 0.9% FLUSH
10.0000 mL | Freq: Two times a day (BID) | INTRAVENOUS | Status: DC
  Administered 2022-09-14 – 2022-09-17 (×6): 10 mL

## 2022-09-14 MED ORDER — SODIUM CHLORIDE 0.9% FLUSH: 10.0000 mL | INTRAVENOUS | Status: DC | PRN

## 2022-09-14 MED ORDER — CLOPIDOGREL BISULFATE 75 MG PO TABS
75.0000 mg | ORAL_TABLET | Freq: Every day | ORAL | Status: DC
  Administered 2022-09-14 – 2022-09-17 (×4): 75 mg via ORAL
  Filled 2022-09-14 (×4): qty 1

## 2022-09-14 MED ORDER — LOSARTAN POTASSIUM 25 MG PO TABS
12.5000 mg | ORAL_TABLET | Freq: Every day | ORAL | Status: DC
  Administered 2022-09-14: 12.5 mg via ORAL
  Filled 2022-09-14: qty 1

## 2022-09-14 NOTE — Progress Notes (Signed)
Physical Therapy Treatment Patient Details Name: Harry [name] MRN: [id number] DOB: [birth year] Today's Date: 09/14/2022   History of Present Illness Pt is a 77 y.o. male presenting 09/06/22 with AMS. MRI showed no acute intracranial abnormality. Admitted with sepsis and acute encephalopathy from bronchopneumonia. PMH includes: HTN, HLD, DM II, CVA, PVD, CKD IIIA, syncope, glaucoma, and dementia.    PT Comments  Pt received in supine and agreeable to session. Pt alert and able to follow simple commands throughout session, however continues to be limited by general weakness. Pt continuing to require max A +2 for bed mobility, but pt is able to advance BLE towards EOB with small movements. Pt attempting to stand x2 with total A +2, however is unable to reach a full upright posture. Pt also demonstrates difficulty coordinating use of RW with tendency to pull up on it. Pt able to perform seated BLE therex without LOB, but cannot move through full ROM without assist due to weakness. Pt continues to benefit from PT services to progress toward functional mobility goals.     Assistance Recommended at Discharge Frequent or constant Supervision/Assistance  If plan is discharge home, recommend the following:  Can travel by private vehicle    Two people to help with walking and/or transfers;Two people to help with bathing/dressing/bathroom;Assistance with cooking/housework;Assistance with feeding;Direct supervision/assist for financial management;Direct supervision/assist for medications management;Assist for transportation;Help with stairs or ramp for entrance   No  Equipment Recommendations  Other (comment) (defer to next venue)    Recommendations for Other Services       Precautions / Restrictions Precautions Precautions: Fall Precaution Comments: dementia Restrictions Weight Bearing Restrictions: No     Mobility  Bed Mobility Overal bed mobility: Needs Assistance Bed Mobility: Supine to  Sit, Sit to Supine     Supine to sit: HOB elevated, Max assist, +2 for physical assistance Sit to supine: Max assist, +2 for physical assistance   General bed mobility comments: Pt able to initiate moving BLE to EOB, however required assist to move off the bed and for trunk elevation. Max A +2 for BLE elevation to EOB, trunk descent, and repositioning to return to supine.    Transfers Overall transfer level: Needs assistance Equipment used: Rolling walker (2 wheels), None Transfers: Sit to/from Stand Sit to Stand: Total assist, +2 physical assistance, From elevated surface           General transfer comment: STS from elevated EOB with total A +2 with pt unable to reach a full upright posture due to BLE weakness. First trial with RW, however pt noted to be pulling up instead of pushing down. Second trial was performed without RW, but pt still unable to fully stand.        Balance Overall balance assessment: Needs assistance Sitting-balance support: Single extremity supported, Bilateral upper extremity supported, Feet supported Sitting balance-Leahy Scale: Poor Sitting balance - Comments: Improved static sitting this session with no lateral lean and is able to perform BLE therex with min guard, but no LOB   Standing balance support: Bilateral upper extremity supported, During functional activity Standing balance-Leahy Scale: Zero Standing balance comment: cannot fully stand with total A +2                            Cognition Arousal/Alertness: Lethargic Behavior During Therapy: Flat affect Overall Cognitive Status: History of cognitive impairments - at baseline  General Comments: Pt alert and able to follow simple commands this session.        Exercises General Exercises - Lower Extremity Long Arc Quad: AROM, Seated, Both, 10 reps Hip Flexion/Marching: AROM, Seated, Both, 5 reps    General Comments         Pertinent Vitals/Pain Pain Assessment Pain Assessment: Faces Faces Pain Scale: No hurt     PT Goals (current goals can now be found in the care plan section) Acute Rehab PT Goals Patient Stated Goal: did not state PT Goal Formulation: With patient Time For Goal Achievement: 09/22/22 Potential to Achieve Goals: Fair Progress towards PT goals: Progressing toward goals    Frequency    Min 1X/week      PT Plan Current plan remains appropriate    AM-PAC PT "6 Clicks" Mobility   Outcome Measure  Help needed turning from your back to your side while in a flat bed without using bedrails?: A Lot Help needed moving from lying on your back to sitting on the side of a flat bed without using bedrails?: Total Help needed moving to and from a bed to a chair (including a wheelchair)?: Total Help needed standing up from a chair using your arms (e.g., wheelchair or bedside chair)?: Total Help needed to walk in hospital room?: Total Help needed climbing 3-5 steps with a railing? : Total 6 Click Score: 7    End of Session Equipment Utilized During Treatment: Gait belt Activity Tolerance: Patient tolerated treatment well Patient left: in bed;with call bell/phone within reach;with bed alarm set Nurse Communication: Mobility status PT Visit Diagnosis: Unsteadiness on feet (R26.81);Muscle weakness (generalized) (M62.81);Difficulty in walking, not elsewhere classified (R26.2)     Time: 1100-1115 PT Time Calculation (min) (ACUTE ONLY): 15 min  Charges:    $Therapeutic Activity: 8-22 mins PT General Charges $$ ACUTE PT VISIT: 1 Visit                     Johny Shock, PTA Acute Rehabilitation Services Secure Chat Preferred  [phone]    Johny Shock 09/14/2022, 12:08 PM

## 2022-09-14 NOTE — Progress Notes (Signed)
PHARMACY CONSULT NOTE FOR:  OUTPATIENT  PARENTERAL ANTIBIOTIC THERAPY (OPAT)  Indication: Granulicatella bacteremia Regimen: Vancomycin 1.5g IV Q24H End date: 10/22/2022  IV antibiotic discharge orders are pended. To discharging provider: please sign these orders via discharge navigator,  Select New Orders & click on the button choice - Manage This Unsigned Work.    Thank you for allowing pharmacy to be a part of this patient's care.  Lendon Ka, PharmD Candidate 09/14/2022, 9:32 AM

## 2022-09-14 NOTE — Progress Notes (Signed)
Consent obtained per [name]. Patient's daughter for PICC placement. RN aware PICC will not be placed today.

## 2022-09-14 NOTE — Progress Notes (Addendum)
PROGRESS NOTE  Harry [name]  FAO:[id number] DOB: 24-Mar-1945 DOA: 09/06/2022 PCP: System, Provider Not In   Brief Narrative: Patient is 77 year old male with with history of advanced dementia, CKD stage III, Type II, anemia, hypertension, hyperlipidemia, peripheral vascular disease, depression who presented with altered mental status.  On presentation,he was found to be dehydrated, altered, lab work showed AKI.  Hospital course remarkable for finding of bacteremia with  GRANULICATELLA ADIACENS .  ID consulted.  Started on vancomycin.  Cardiology also consulted for consideration of TEE  for bacteremia but deferred due to his poor mentation.  Now the plan is to continue 6 weeks of IV antibiotics.  PT/OT recommending skilled facility on discharge.  Daughter is requesting for [facility].  TOC following.  Medically stable for discharge whenever possible  Assessment & Plan:  Principal Problem:   Acute encephalopathy Active Problems:   Type 2 diabetes mellitus with stage 3 chronic kidney disease, with long-term current use of insulin (HCC)   Essential hypertension   PAD (peripheral artery disease) (HCC)   S/P peripheral artery angioplasty   Acute renal failure superimposed on stage 3a chronic kidney disease (HCC)   Lewy body dementia (HCC)   History of CVA (cerebrovascular accident)   Sepsis (HCC)   Normocytic anemia   Hyperlipidemia   Dementia (HCC)   GI bleed   Heme positive stool   Acute on chronic anemia   Antiplatelet or antithrombotic long-term use   Anemia   Folate deficiency   Bacteremia  Granulicatella bacteremia : Sepsis physiology has resolved.  Currently on IV vancomycin.  ID were following.  Plan for TEE to rule out endocarditis but due to mentation, not done .  ID recommending 6 weeks of IV vancomycin till 8/19.Plan for picc line placement  Suspected pneumonia: Currently on room air.  Already on antibiotics. CT imagings showed bilateral lower lobe bronchial wall thickening with  ground-glass attenuation and thickening of the peribronchovascular interstitium,concerning for bronchopneumonia.Also showed  1 cm right upper lobe pulmonary nodule, increased in size from 6 mm on 06/02/2022. We recommend outpatient follow up with pulmonary medicine or thoracic surgery after dc  Advanced dementia: History of Lewy body dementia.  Presented with more confusion from baseline, altered mentation.  Continue supportive care, delirium precautions. Also has history of CVA/peripheral vascular disease.  Was on aspirin and Plavix at home which are on hold. CT/MRI of the brain negative for acute findings.  On Mestinon at home.We will just resume plavix for now  Diabetes type 2: On insulin at home.  Currently on sliding scale.  Monitor blood sugars  Normocytic anemia: No evidence of active bleeding.  GI was following but has signed off.  Continue to monitor hemoglobin intermittently.  FOBT was positive  AKI on CKD stage IIIa:   Presented with creatinine in the range of 2.4.  Currently kidney function  at baseline. Patient found to have retained urine on presentation, Foley placed.  Given voiding trial.  He is passing urine without any problem  Deconditioning/debility: PT/OT recommending skilled nursing facility on discharge.  TOC consulted.  Goals of care: Elderly patient with multiple comorbidities.  Palliative care consulted for goals of care but deferred by family with intention of full scope of treatment.  Plan is to transfer him to LTAC.  Nutrition Problem: Inadequate oral intake Etiology: lethargy/confusion, poor appetite    DVT prophylaxis:SCDs Start: 09/06/22 1341     Code Status: Full Code  Family Communication: Called and discussed with daughter on phone on 7/12  Patient status:Inpatient  Patient is from :home  Anticipated discharge to:SNF  Estimated DC date:whenever possible   Consultants: ID, cardiology, GI  Procedures: None  Antimicrobials:  Anti-infectives  (From admission, onward)    Start     Dose/Rate Route Frequency Ordered Stop   09/14/22 1400  vancomycin (VANCOREADY) IVPB 1500 mg/300 mL        1,500 mg 150 mL/hr over 120 Minutes Intravenous Every 24 hours 09/13/22 1930     09/10/22 1200  vancomycin (VANCOREADY) IVPB 1750 mg/350 mL  Status:  Discontinued        1,750 mg 175 mL/hr over 120 Minutes Intravenous Every 24 hours 09/10/22 0953 09/13/22 1930   09/10/22 1200  ceFEPIme (MAXIPIME) 2 g in sodium chloride 0.9 % 100 mL IVPB  Status:  Discontinued        2 g 200 mL/hr over 30 Minutes Intravenous Every 8 hours 09/10/22 0953 09/10/22 1027   09/08/22 1315  ceFEPIme (MAXIPIME) 2 g in sodium chloride 0.9 % 100 mL IVPB  Status:  Discontinued        2 g 200 mL/hr over 30 Minutes Intravenous Every 12 hours 09/08/22 1304 09/10/22 0953   09/08/22 1300  vancomycin (VANCOREADY) IVPB 1500 mg/300 mL  Status:  Discontinued        1,500 mg 150 mL/hr over 120 Minutes Intravenous Every 24 hours 09/08/22 1212 09/10/22 0953   09/07/22 1400  ceFEPIme (MAXIPIME) 2 g in sodium chloride 0.9 % 100 mL IVPB  Status:  Discontinued        2 g 200 mL/hr over 30 Minutes Intravenous Every 24 hours 09/06/22 1250 09/08/22 1304   09/07/22 1230  vancomycin (VANCOREADY) IVPB 750 mg/150 mL  Status:  Discontinued        750 mg 150 mL/hr over 60 Minutes Intravenous Every 24 hours 09/07/22 1139 09/08/22 1212   09/06/22 2200  metroNIDAZOLE (FLAGYL) IVPB 500 mg  Status:  Discontinued        500 mg 100 mL/hr over 60 Minutes Intravenous Every 12 hours 09/06/22 1344 09/10/22 1027   09/06/22 1250  vancomycin variable dose per unstable renal function (pharmacist dosing)  Status:  Discontinued         Does not apply See admin instructions 09/06/22 1250 09/07/22 1159   09/06/22 1015  metroNIDAZOLE (FLAGYL) IVPB 500 mg        500 mg 100 mL/hr over 60 Minutes Intravenous  Once 09/06/22 1002 09/06/22 1216   09/06/22 1015  ceFEPIme (MAXIPIME) 2 g in sodium chloride 0.9 % 100 mL IVPB         2 g 200 mL/hr over 30 Minutes Intravenous  Once 09/06/22 1010 09/06/22 1133   09/06/22 1015  vancomycin (VANCOREADY) IVPB 1500 mg/300 mL        1,500 mg 150 mL/hr over 120 Minutes Intravenous  Once 09/06/22 1010 09/06/22 1436       Subjective: Patient seen and examined the bedside today.  Hemodynamically stable.  Comfortable lying in bed.  No acute issues.  Objective: Vitals:   09/13/22 2300 09/14/22 0322 09/14/22 0500 09/14/22 0732  BP: (!) 169/73 (!) 155/82  (!) 167/69  Pulse:  74  71  Resp: 15 14  15   Temp: 97.9 F (36.6 C)   97.9 F (36.6 C)  TempSrc: Oral Oral  Oral  SpO2:  96%  96%  Weight:   84.1 kg   Height:        Intake/Output Summary (Last 24 hours) at 09/14/2022  1003 Last data filed at 09/14/2022 0658 Gross per 24 hour  Intake 588 ml  Output 1000 ml  Net -412 ml   Filed Weights   09/12/22 0500 09/13/22 0526 09/14/22 0500  Weight: 82.1 kg 87.8 kg 84.1 kg    Examination:   General exam: Overall comfortable, not in distress, weak, deconditioned, pleasantly confused HEENT: Cataract on left eye respiratory system:  no wheezes or crackles  Cardiovascular system: S1 & S2 heard, RRR.  Gastrointestinal system: Abdomen is nondistended, soft and nontender. Central nervous system: sleepy this mrng, confused, left hemiparesis Extremities: No edema, no clubbing ,no cyanosis Skin: No rashes, no ulcers,no icterus     Data Reviewed: I have personally reviewed following labs and imaging studies  CBC: Recent Labs  Lab 09/08/22 0715 09/08/22 1808 09/10/22 0134 09/11/22 0109 09/13/22 0251  WBC 18.2* 16.1* 9.4 5.3 6.7  NEUTROABS 15.5* 14.0* 6.6 2.5  --   HGB 8.8* 8.5* 8.8* 8.2* 8.1*  HCT 28.3* 26.8* 27.2* 25.9* 25.8*  MCV 97.3 95.4 95.8 92.5 93.5  PLT 150 155 156 168 222   Basic Metabolic Panel: Recent Labs  Lab 09/09/22 0125 09/10/22 0134 09/11/22 0109 09/13/22 0251 09/14/22 0149  NA 139 140 139 138 136  K 3.2* 3.5 3.5 3.2* 3.7  CL 113* 110  110 105 107  CO2 20* 23 21* 22 23  GLUCOSE 235* 196* 258* 177* 227*  BUN 18 14 18 14 12   CREATININE 1.12 0.92 1.00 1.17 0.88  CALCIUM 7.2* 7.6* 7.7* 7.9* 7.7*  MG  --  1.5* 2.0  --   --   PHOS  --  1.5* 3.2  --   --      Recent Results (from the past 240 hour(s))  Blood Culture (routine x 2)     Status: Abnormal   Collection Time: 09/06/22 10:01 AM   Specimen: BLOOD RIGHT FOREARM  Result Value Ref Range Status   Specimen Description BLOOD RIGHT FOREARM  Final   Special Requests   Final    BOTTLES DRAWN AEROBIC AND ANAEROBIC Blood Culture adequate volume   Culture  Setup Time   Final    GRAM POSITIVE COCCI ANAEROBIC BOTTLE ONLY CRITICAL RESULT CALLED TO, READ BACK BY AND VERIFIED WITH: PHARMD K. AHMEND [id number] @2100  FH    Culture (A)  Final    GRANULICATELLA ADIACENS Standardized susceptibility testing for this organism is not available. Performed at [facility], [address], [place], [place] [address]    Report Status 09/10/2022 FINAL  Final  Blood Culture ID Panel (Reflexed)     Status: None   Collection Time: 09/06/22 10:01 AM  Result Value Ref Range Status   Enterococcus faecalis NOT DETECTED NOT DETECTED Final   Enterococcus Faecium NOT DETECTED NOT DETECTED Final   Listeria monocytogenes NOT DETECTED NOT DETECTED Final   Staphylococcus species NOT DETECTED NOT DETECTED Final   Staphylococcus aureus (BCID) NOT DETECTED NOT DETECTED Final   Staphylococcus epidermidis NOT DETECTED NOT DETECTED Final   Staphylococcus lugdunensis NOT DETECTED NOT DETECTED Final   Streptococcus species NOT DETECTED NOT DETECTED Final   Streptococcus agalactiae NOT DETECTED NOT DETECTED Final   Streptococcus pneumoniae NOT DETECTED NOT DETECTED Final   Streptococcus pyogenes NOT DETECTED NOT DETECTED Final   A.calcoaceticus-baumannii NOT DETECTED NOT DETECTED Final   Bacteroides fragilis NOT DETECTED NOT DETECTED Final   Enterobacterales NOT DETECTED NOT DETECTED Final    Enterobacter cloacae complex NOT DETECTED NOT DETECTED Final   Escherichia coli  NOT DETECTED NOT DETECTED Final   Klebsiella aerogenes NOT DETECTED NOT DETECTED Final   Klebsiella oxytoca NOT DETECTED NOT DETECTED Final   Klebsiella pneumoniae NOT DETECTED NOT DETECTED Final   Proteus species NOT DETECTED NOT DETECTED Final   Salmonella species NOT DETECTED NOT DETECTED Final   Serratia marcescens NOT DETECTED NOT DETECTED Final   Haemophilus influenzae NOT DETECTED NOT DETECTED Final   Neisseria meningitidis NOT DETECTED NOT DETECTED Final   Pseudomonas aeruginosa NOT DETECTED NOT DETECTED Final   Stenotrophomonas maltophilia NOT DETECTED NOT DETECTED Final   Candida albicans NOT DETECTED NOT DETECTED Final   Candida auris NOT DETECTED NOT DETECTED Final   Candida glabrata NOT DETECTED NOT DETECTED Final   Candida krusei NOT DETECTED NOT DETECTED Final   Candida parapsilosis NOT DETECTED NOT DETECTED Final   Candida tropicalis NOT DETECTED NOT DETECTED Final   Cryptococcus neoformans/gattii NOT DETECTED NOT DETECTED Final    Comment: Performed at [facility], [address] [place], [place] [address]  SARS Coronavirus 2 by RT PCR (hospital order, performed in [facility] hospital lab) *cepheid single result test* Anterior Nasal Swab     Status: None   Collection Time: 09/06/22 10:02 AM   Specimen: Anterior Nasal Swab  Result Value Ref Range Status   SARS Coronavirus 2 by RT PCR NEGATIVE NEGATIVE Final    Comment: Performed at [facility], [address] [place], [place] [address]  Urine Culture     Status: Abnormal   Collection Time: 09/06/22 10:03 AM   Specimen: Urine, Catheterized  Result Value Ref Range Status   Specimen Description URINE, CATHETERIZED  Final   Special Requests   Final    NONE Performed at [facility], [address], [place], [place] [address]    Culture MULTIPLE SPECIES PRESENT, SUGGEST RECOLLECTION (A)  Final   Report Status  09/07/2022 FINAL  Final  Blood Culture (routine x 2)     Status: None   Collection Time: 09/06/22 10:06 AM   Specimen: BLOOD  Result Value Ref Range Status   Specimen Description BLOOD SITE NOT SPECIFIED  Final   Special Requests   Final    BOTTLES DRAWN AEROBIC AND ANAEROBIC Blood Culture adequate volume   Culture   Final    NO GROWTH 5 DAYS Performed at [facility], [address] [place], [place] [address]    Report Status 09/11/2022 FINAL  Final  Culture, OB Urine     Status: None   Collection Time: 09/10/22 10:28 AM   Specimen: Urine, Random  Result Value Ref Range Status   Specimen Description URINE, RANDOM  Final   Special Requests NONE  Final   Culture   Final    NO GROWTH NO GROUP B STREP (S.AGALACTIAE) ISOLATED Performed at [facility], [address] [place], [place] [address]    Report Status 09/11/2022 FINAL  Final  Culture, blood (Routine X 2) w Reflex to ID Panel     Status: None (Preliminary result)   Collection Time: 09/10/22 10:47 AM   Specimen: BLOOD RIGHT HAND  Result Value Ref Range Status   Specimen Description BLOOD RIGHT HAND  Final   Special Requests   Final    BOTTLES DRAWN AEROBIC AND ANAEROBIC Blood Culture adequate volume   Culture   Final    NO GROWTH 4 DAYS Performed at [facility] [place] [facility], [address] [place], [place] [address]  Report Status PENDING  Incomplete  Culture, blood (Routine X 2) w Reflex to ID Panel     Status: None (Preliminary result)   Collection Time: 09/10/22 10:50 AM   Specimen: BLOOD LEFT HAND  Result Value Ref Range Status   Specimen Description BLOOD LEFT HAND  Final   Special Requests   Final    BOTTLES DRAWN AEROBIC AND ANAEROBIC Blood Culture adequate volume   Culture   Final    NO GROWTH 4 DAYS Performed at [facility], [address] [place], [place] [address]    Report Status PENDING  Incomplete     Radiology Studies: [place] EKG SITE RITE  Result Date: 09/13/2022 If Site Rite  image not attached, placement could not be confirmed due to current cardiac rhythm.   Scheduled Meds:  carvedilol  12.5 mg Oral BID WC   feeding supplement (GLUCERNA SHAKE)  237 mL Oral TID BM   folic acid  1 mg Oral Daily   insulin aspart  0-5 Units Subcutaneous QHS   insulin aspart  0-9 Units Subcutaneous TID WC   losartan  12.5 mg Oral Daily   multivitamin  1 tablet Oral QHS   pantoprazole (PROTONIX) IV  40 mg Intravenous Q12H   pyridostigmine  30 mg Oral Q8H   rosuvastatin  5 mg Oral QHS   sertraline  25 mg Oral Daily   sodium chloride flush  3 mL Intravenous Q12H   tamsulosin  0.4 mg Oral Daily   Continuous Infusions:  vancomycin       LOS: 8 days   Burnadette Pop, MD [facility] P7/02/2023, 10:03 AM

## 2022-09-14 NOTE — Progress Notes (Signed)
Spoke with RN regarding pt discharge status. Was informed pt currently has no discharge plans at this time, and has current IV access.

## 2022-09-14 NOTE — TOC CM/SW Note (Signed)
Received msg this am from CSW that daughter [name] requesting referral to [facility], call made to liaison Irving Burton to review for eligibility.  TOC has received confirmation from [facility] that pt is eligible for [facility] and [facility] can offer bed today.   Call made to Daughter [name] along with CSW- Aram Beecham to discuss transition plans and update on [facility] bed offer. Per TC with [name] she states that she is has spoken with [facility] about expectation and pt needs, she has yet to speak with [facility]. [name] voices that she has not made a decision yet on dispo and is not prepared to make decision today stating she needs more time. Explained that per attending pt is medically stable pending PICC line placement. [name] voices that she is hopefull to have a decision by tomorrow on plan [facility] vs return to [facility].   CM has updated [facility] Print production planner.  TOC weekend staff can follow up for final plan.

## 2022-09-14 NOTE — Progress Notes (Signed)
Peripherally Inserted Central Catheter Placement  The IV Nurse has discussed with the patient and/or persons authorized to consent for the patient, the purpose of this procedure and the potential benefits and risks involved with this procedure.  The benefits include less needle sticks, lab draws from the catheter, and the patient may be discharged home with the catheter. Risks include, but not limited to, infection, bleeding, blood clot (thrombus formation), and puncture of an artery; nerve damage and irregular heartbeat and possibility to perform a PICC exchange if needed/ordered by physician.  Alternatives to this procedure were also discussed.  Bard Power PICC patient education guide, fact sheet on infection prevention and patient information card has been provided to patient /or left at bedside.  Consent obtained via telephone from daughter 7/11 due to altered mental status.  PICC Placement Documentation  PICC Single Lumen 09/14/22 Right Brachial 39 cm 0 cm (Active)  Indication for Insertion or Continuance of Line Home intravenous therapies (PICC only) 09/14/22 2206  Exposed Catheter (cm) 0 cm 09/14/22 2206  Site Assessment Clean, Dry, Intact 09/14/22 2206  Line Status Flushed;Saline locked;Blood return noted 09/14/22 2206  Dressing Type Transparent;Securing device 09/14/22 2206  Dressing Status Antimicrobial disc in place;Clean, Dry, Intact 09/14/22 2206  Safety Lock Not Applicable 09/14/22 2206  Line Care Connections checked and tightened 09/14/22 2206  Line Adjustment (NICU/IV Team Only) No 09/14/22 2206  Dressing Intervention New dressing 09/14/22 2206  Dressing Change Due 09/21/22 09/14/22 2206       Hawraa Stambaugh, Lajean Manes 09/14/2022, 10:07 PM

## 2022-09-15 DIAGNOSIS — G9340 Encephalopathy, unspecified: Secondary | ICD-10-CM | POA: Diagnosis not present

## 2022-09-15 LAB — CULTURE, BLOOD (ROUTINE X 2)
Culture: NO GROWTH
Special Requests: ADEQUATE
Special Requests: ADEQUATE

## 2022-09-15 LAB — CBC
HCT: 26.7 % — ABNORMAL LOW (ref 39.0–52.0)
Hemoglobin: 8.3 g/dL — ABNORMAL LOW (ref 13.0–17.0)
MCH: 29 pg (ref 26.0–34.0)
MCHC: 31.1 g/dL (ref 30.0–36.0)
MCV: 93.4 fL (ref 80.0–100.0)
Platelets: 256 10*3/uL (ref 150–400)
RBC: 2.86 MIL/uL — ABNORMAL LOW (ref 4.22–5.81)
RDW: 16.9 % — ABNORMAL HIGH (ref 11.5–15.5)
WBC: 5.8 10*3/uL (ref 4.0–10.5)
nRBC: 0 % (ref 0.0–0.2)

## 2022-09-15 LAB — GLUCOSE, CAPILLARY
Glucose-Capillary: 118 mg/dL — ABNORMAL HIGH (ref 70–99)
Glucose-Capillary: 154 mg/dL — ABNORMAL HIGH (ref 70–99)
Glucose-Capillary: 182 mg/dL — ABNORMAL HIGH (ref 70–99)
Glucose-Capillary: 293 mg/dL — ABNORMAL HIGH (ref 70–99)

## 2022-09-15 LAB — BASIC METABOLIC PANEL
Anion gap: 7 (ref 5–15)
BUN: 11 mg/dL (ref 8–23)
CO2: 22 mmol/L (ref 22–32)
Calcium: 7.9 mg/dL — ABNORMAL LOW (ref 8.9–10.3)
Chloride: 108 mmol/L (ref 98–111)
Creatinine, Ser: 1.02 mg/dL (ref 0.61–1.24)
GFR, Estimated: >60 mL/min (ref >60)
Glucose, Bld: 211 mg/dL — ABNORMAL HIGH (ref 70–99)
Potassium: 3.7 mmol/L (ref 3.5–5.1)
Sodium: 137 mmol/L (ref 135–145)

## 2022-09-15 MED ORDER — LOSARTAN POTASSIUM 50 MG PO TABS
50.0000 mg | ORAL_TABLET | Freq: Every day | ORAL | Status: DC
  Administered 2022-09-16 – 2022-09-17 (×2): 50 mg via ORAL
  Filled 2022-09-15 (×2): qty 1

## 2022-09-15 MED ORDER — LOSARTAN POTASSIUM 25 MG PO TABS
25.0000 mg | ORAL_TABLET | Freq: Every day | ORAL | Status: DC
  Administered 2022-09-15: 25 mg via ORAL
  Filled 2022-09-15: qty 1

## 2022-09-15 MED ORDER — LABETALOL HCL 5 MG/ML IV SOLN
10.0000 mg | INTRAVENOUS | Status: DC | PRN
  Administered 2022-09-15 – 2022-09-17 (×4): 10 mg via INTRAVENOUS
  Filled 2022-09-15 (×4): qty 4

## 2022-09-15 NOTE — Progress Notes (Signed)
PROGRESS NOTE  Harry [name]  RUE:[id number] DOB: 1945-03-28 DOA: 09/06/2022 PCP: System, Provider Not In   Brief Narrative: Patient is 77 year old male with with history of advanced dementia, CKD stage III, Type II, anemia, hypertension, hyperlipidemia, peripheral vascular disease, depression who presented with altered mental status.  On presentation,he was found to be dehydrated, altered, lab work showed AKI.  Hospital course remarkable for finding of bacteremia with  GRANULICATELLA ADIACENS .  ID consulted.  Started on vancomycin.  Cardiology also consulted for consideration of TEE  for bacteremia but deferred due to his poor mentation.  Now the plan is to continue 6 weeks of IV antibiotics.  PT/OT recommending skilled facility on discharge.  Daughter is requesting for [facility].  TOC following.  Medically stable for discharge whenever possible  Assessment & Plan:  Principal Problem:   Acute encephalopathy Active Problems:   Type 2 diabetes mellitus with stage 3 chronic kidney disease, with long-term current use of insulin (HCC)   Essential hypertension   PAD (peripheral artery disease) (HCC)   S/P peripheral artery angioplasty   Acute renal failure superimposed on stage 3a chronic kidney disease (HCC)   Lewy body dementia (HCC)   History of CVA (cerebrovascular accident)   Sepsis (HCC)   Normocytic anemia   Hyperlipidemia   Dementia (HCC)   GI bleed   Heme positive stool   Acute on chronic anemia   Antiplatelet or antithrombotic long-term use   Anemia   Folate deficiency   Bacteremia  Granulicatella bacteremia : Sepsis physiology has resolved.  Currently on IV vancomycin.  ID were following.  Plan for TEE to rule out endocarditis but due to mentation, not done .  ID recommending 6 weeks of IV vancomycin till 8/19.S/p picc line placement  Suspected pneumonia: Currently on room air.  Already on antibiotics. CT imagings showed bilateral lower lobe bronchial wall thickening with  ground-glass attenuation and thickening of the peribronchovascular interstitium,concerning for bronchopneumonia.Also showed  1 cm right upper lobe pulmonary nodule, increased in size from 6 mm on 06/02/2022. We recommend outpatient follow up with pulmonary medicine or thoracic surgery after dc  Advanced dementia: History of Lewy body dementia.  Presented with more confusion from baseline, altered mentation.  Continue supportive care, delirium precautions. Also has history of CVA/peripheral vascular disease.  Was on aspirin and Plavix at home which are on hold. CT/MRI of the brain negative for acute findings.  On Mestinon at home.We resumed plavix  Diabetes type 2: On insulin at home.  Currently on sliding scale.  Monitor blood sugars  Normocytic anemia: No evidence of active bleeding.  GI was following but has signed off.  Continue to monitor hemoglobin intermittently.  FOBT was positive  AKI on CKD stage IIIa:   Presented with creatinine in the range of 2.4.  Currently kidney function  at baseline. Patient found to have retained urine on presentation, Foley placed.  Given voiding trial.  He is passing urine without any problem  Deconditioning/debility: PT/OT recommending skilled nursing facility on discharge.  TOC consulted.  Goals of care: Elderly patient with multiple comorbidities.  Palliative care consulted for goals of care but deferred by family with intention of full scope of treatment.  Plan is to transfer him to LTAC.  Nutrition Problem: Inadequate oral intake Etiology: lethargy/confusion, poor appetite    DVT prophylaxis:SCDs Start: 09/06/22 1341     Code Status: Full Code  Family Communication: Called daughter [name] twice today,goes to voice mail.Calling daughter everyday  Patient status:Inpatient  Patient is from :home  Anticipated discharge to:SNF  Estimated DC date:whenever possible   Consultants: ID, cardiology, GI  Procedures: None  Antimicrobials:   Anti-infectives (From admission, onward)    Start     Dose/Rate Route Frequency Ordered Stop   09/14/22 1400  vancomycin (VANCOREADY) IVPB 1500 mg/300 mL        1,500 mg 150 mL/hr over 120 Minutes Intravenous Every 24 hours 09/13/22 1930     09/10/22 1200  vancomycin (VANCOREADY) IVPB 1750 mg/350 mL  Status:  Discontinued        1,750 mg 175 mL/hr over 120 Minutes Intravenous Every 24 hours 09/10/22 0953 09/13/22 1930   09/10/22 1200  ceFEPIme (MAXIPIME) 2 g in sodium chloride 0.9 % 100 mL IVPB  Status:  Discontinued        2 g 200 mL/hr over 30 Minutes Intravenous Every 8 hours 09/10/22 0953 09/10/22 1027   09/08/22 1315  ceFEPIme (MAXIPIME) 2 g in sodium chloride 0.9 % 100 mL IVPB  Status:  Discontinued        2 g 200 mL/hr over 30 Minutes Intravenous Every 12 hours 09/08/22 1304 09/10/22 0953   09/08/22 1300  vancomycin (VANCOREADY) IVPB 1500 mg/300 mL  Status:  Discontinued        1,500 mg 150 mL/hr over 120 Minutes Intravenous Every 24 hours 09/08/22 1212 09/10/22 0953   09/07/22 1400  ceFEPIme (MAXIPIME) 2 g in sodium chloride 0.9 % 100 mL IVPB  Status:  Discontinued        2 g 200 mL/hr over 30 Minutes Intravenous Every 24 hours 09/06/22 1250 09/08/22 1304   09/07/22 1230  vancomycin (VANCOREADY) IVPB 750 mg/150 mL  Status:  Discontinued        750 mg 150 mL/hr over 60 Minutes Intravenous Every 24 hours 09/07/22 1139 09/08/22 1212   09/06/22 2200  metroNIDAZOLE (FLAGYL) IVPB 500 mg  Status:  Discontinued        500 mg 100 mL/hr over 60 Minutes Intravenous Every 12 hours 09/06/22 1344 09/10/22 1027   09/06/22 1250  vancomycin variable dose per unstable renal function (pharmacist dosing)  Status:  Discontinued         Does not apply See admin instructions 09/06/22 1250 09/07/22 1159   09/06/22 1015  metroNIDAZOLE (FLAGYL) IVPB 500 mg        500 mg 100 mL/hr over 60 Minutes Intravenous  Once 09/06/22 1002 09/06/22 1216   09/06/22 1015  ceFEPIme (MAXIPIME) 2 g in sodium chloride  0.9 % 100 mL IVPB        2 g 200 mL/hr over 30 Minutes Intravenous  Once 09/06/22 1010 09/06/22 1133   09/06/22 1015  vancomycin (VANCOREADY) IVPB 1500 mg/300 mL        1,500 mg 150 mL/hr over 120 Minutes Intravenous  Once 09/06/22 1010 09/06/22 1436       Subjective: Patient seen and examined the bedside today.  Hemodynamically stable.  Very comfortable today.  Alert and awake.  Talks well.  Complains of some discomfort on the left eye.  He has cataract on the left eye.  Not in any Distress.  Objective: Vitals:   09/14/22 1545 09/14/22 1940 09/15/22 0305 09/15/22 0718  BP: (!) 141/68 (!) 194/86 (!) 174/82 (!) 184/85  Pulse:  76 74 73  Resp:  14 16 13   Temp: 97.7 F (36.5 C) 98.1 F (36.7 C) 97.9 F (36.6 C) 97.6 F (36.4 C)  TempSrc: Axillary Oral Oral Oral  SpO2:  100% 98% 99%  Weight:      Height:        Intake/Output Summary (Last 24 hours) at 09/15/2022 1027 Last data filed at 09/15/2022 0534 Gross per 24 hour  Intake 1180.38 ml  Output 2050 ml  Net -869.62 ml   Filed Weights   09/12/22 0500 09/13/22 0526 09/14/22 0500  Weight: 82.1 kg 87.8 kg 84.1 kg    Examination:  General exam: Overall comfortable, not in distress, weak, deconditioned, pleasantly confused HEENT: Left cataract  respiratory system:  no wheezes or crackles  Cardiovascular system: S1 & S2 heard, RRR.  Gastrointestinal system: Abdomen is nondistended, soft and nontender. Central nervous system: Alert and awake and oriented, obeys commands, left sided weakness Extremities: No edema, no clubbing ,no cyanosis Skin: No rashes, no ulcers,no icterus     Data Reviewed: I have personally reviewed following labs and imaging studies  CBC: Recent Labs  Lab 09/08/22 1808 09/10/22 0134 09/11/22 0109 09/13/22 0251 09/15/22 0126  WBC 16.1* 9.4 5.3 6.7 5.8  NEUTROABS 14.0* 6.6 2.5  --   --   HGB 8.5* 8.8* 8.2* 8.1* 8.3*  HCT 26.8* 27.2* 25.9* 25.8* 26.7*  MCV 95.4 95.8 92.5 93.5 93.4  PLT 155  156 168 222 256   Basic Metabolic Panel: Recent Labs  Lab 09/10/22 0134 09/11/22 0109 09/13/22 0251 09/14/22 0149 09/15/22 0126  NA 140 139 138 136 137  K 3.5 3.5 3.2* 3.7 3.7  CL 110 110 105 107 108  CO2 23 21* 22 23 22   GLUCOSE 196* 258* 177* 227* 211*  BUN 14 18 14 12 11   CREATININE 0.92 1.00 1.17 0.88 1.02  CALCIUM 7.6* 7.7* 7.9* 7.7* 7.9*  MG 1.5* 2.0  --   --   --   PHOS 1.5* 3.2  --   --   --      Recent Results (from the past 240 hour(s))  Blood Culture (routine x 2)     Status: Abnormal   Collection Time: 09/06/22 10:01 AM   Specimen: BLOOD RIGHT FOREARM  Result Value Ref Range Status   Specimen Description BLOOD RIGHT FOREARM  Final   Special Requests   Final    BOTTLES DRAWN AEROBIC AND ANAEROBIC Blood Culture adequate volume   Culture  Setup Time   Final    GRAM POSITIVE COCCI ANAEROBIC BOTTLE ONLY CRITICAL RESULT CALLED TO, READ BACK BY AND VERIFIED WITH: PHARMD K. AHMEND [id number] @2100  FH    Culture (A)  Final    GRANULICATELLA ADIACENS Standardized susceptibility testing for this organism is not available. Performed at [facility], [address] [place], [place] [address]    Report Status 09/10/2022 FINAL  Final  Blood Culture ID Panel (Reflexed)     Status: None   Collection Time: 09/06/22 10:01 AM  Result Value Ref Range Status   Enterococcus faecalis NOT DETECTED NOT DETECTED Final   Enterococcus Faecium NOT DETECTED NOT DETECTED Final   Listeria monocytogenes NOT DETECTED NOT DETECTED Final   Staphylococcus species NOT DETECTED NOT DETECTED Final   Staphylococcus aureus (BCID) NOT DETECTED NOT DETECTED Final   Staphylococcus epidermidis NOT DETECTED NOT DETECTED Final   Staphylococcus lugdunensis NOT DETECTED NOT DETECTED Final   Streptococcus species NOT DETECTED NOT DETECTED Final   Streptococcus agalactiae NOT DETECTED NOT DETECTED Final   Streptococcus pneumoniae NOT DETECTED NOT DETECTED Final   Streptococcus pyogenes NOT DETECTED  NOT DETECTED Final   A.calcoaceticus-baumannii NOT DETECTED NOT DETECTED Final  Bacteroides fragilis NOT DETECTED NOT DETECTED Final   Enterobacterales NOT DETECTED NOT DETECTED Final   Enterobacter cloacae complex NOT DETECTED NOT DETECTED Final   Escherichia coli NOT DETECTED NOT DETECTED Final   Klebsiella aerogenes NOT DETECTED NOT DETECTED Final   Klebsiella oxytoca NOT DETECTED NOT DETECTED Final   Klebsiella pneumoniae NOT DETECTED NOT DETECTED Final   Proteus species NOT DETECTED NOT DETECTED Final   Salmonella species NOT DETECTED NOT DETECTED Final   Serratia marcescens NOT DETECTED NOT DETECTED Final   Haemophilus influenzae NOT DETECTED NOT DETECTED Final   Neisseria meningitidis NOT DETECTED NOT DETECTED Final   Pseudomonas aeruginosa NOT DETECTED NOT DETECTED Final   Stenotrophomonas maltophilia NOT DETECTED NOT DETECTED Final   Candida albicans NOT DETECTED NOT DETECTED Final   Candida auris NOT DETECTED NOT DETECTED Final   Candida glabrata NOT DETECTED NOT DETECTED Final   Candida krusei NOT DETECTED NOT DETECTED Final   Candida parapsilosis NOT DETECTED NOT DETECTED Final   Candida tropicalis NOT DETECTED NOT DETECTED Final   Cryptococcus neoformans/gattii NOT DETECTED NOT DETECTED Final    Comment: Performed at [facility], [address], [place], [place] [address]  SARS Coronavirus 2 by RT PCR (hospital order, performed in [facility] hospital lab) *cepheid single result test* Anterior Nasal Swab     Status: None   Collection Time: 09/06/22 10:02 AM   Specimen: Anterior Nasal Swab  Result Value Ref Range Status   SARS Coronavirus 2 by RT PCR NEGATIVE NEGATIVE Final    Comment: Performed at [facility], [address], [place], [place] [address]  Urine Culture     Status: Abnormal   Collection Time: 09/06/22 10:03 AM   Specimen: Urine, Catheterized  Result Value Ref Range Status   Specimen Description URINE, CATHETERIZED  Final   Special Requests    Final    NONE Performed at [facility], [address], [place], [place] [address]    Culture MULTIPLE SPECIES PRESENT, SUGGEST RECOLLECTION (A)  Final   Report Status 09/07/2022 FINAL  Final  Blood Culture (routine x 2)     Status: None   Collection Time: 09/06/22 10:06 AM   Specimen: BLOOD  Result Value Ref Range Status   Specimen Description BLOOD SITE NOT SPECIFIED  Final   Special Requests   Final    BOTTLES DRAWN AEROBIC AND ANAEROBIC Blood Culture adequate volume   Culture   Final    NO GROWTH 5 DAYS Performed at [facility], [address] [place], [place] [address]    Report Status 09/11/2022 FINAL  Final  Culture, OB Urine     Status: None   Collection Time: 09/10/22 10:28 AM   Specimen: Urine, Random  Result Value Ref Range Status   Specimen Description URINE, RANDOM  Final   Special Requests NONE  Final   Culture   Final    NO GROWTH NO GROUP B STREP (S.AGALACTIAE) ISOLATED Performed at [facility], [address], [place], [place] [address]    Report Status 09/11/2022 FINAL  Final  Culture, blood (Routine X 2) w Reflex to ID Panel     Status: None   Collection Time: 09/10/22 10:47 AM   Specimen: BLOOD RIGHT HAND  Result Value Ref Range Status   Specimen Description BLOOD RIGHT HAND  Final   Special Requests   Final    BOTTLES DRAWN AEROBIC AND ANAEROBIC Blood Culture adequate volume  Culture   Final    NO GROWTH 5 DAYS Performed at [facility], [address], [place], [place] [address]    Report Status 09/15/2022 FINAL  Final  Culture, blood (Routine X 2) w Reflex to ID Panel     Status: None   Collection Time: 09/10/22 10:50 AM   Specimen: BLOOD LEFT HAND  Result Value Ref Range Status   Specimen Description BLOOD LEFT HAND  Final   Special Requests   Final    BOTTLES DRAWN AEROBIC AND ANAEROBIC Blood Culture adequate volume   Culture   Final    NO GROWTH 5 DAYS Performed at [facility], [address]  [place], [place] [address]    Report Status 09/15/2022 FINAL  Final     Radiology Studies: [place] EKG SITE RITE  Result Date: 09/13/2022 If Site Rite image not attached, placement could not be confirmed due to current cardiac rhythm.   Scheduled Meds:  carvedilol  12.5 mg Oral BID WC   Chlorhexidine Gluconate Cloth  6 each Topical Daily   clopidogrel  75 mg Oral Daily   feeding supplement (GLUCERNA SHAKE)  237 mL Oral TID BM   folic acid  1 mg Oral Daily   insulin aspart  0-5 Units Subcutaneous QHS   insulin aspart  0-9 Units Subcutaneous TID WC   losartan  25 mg Oral Daily   multivitamin  1 tablet Oral QHS   pantoprazole (PROTONIX) IV  40 mg Intravenous Q12H   pyridostigmine  30 mg Oral Q8H   rosuvastatin  5 mg Oral QHS   sertraline  25 mg Oral Daily   sodium chloride flush  10-40 mL Intracatheter Q12H   sodium chloride flush  3 mL Intravenous Q12H   tamsulosin  0.4 mg Oral Daily   Continuous Infusions:  vancomycin 1,500 mg (09/14/22 1409)     LOS: 9 days   Burnadette Pop, MD [facility] P7/13/2024, 10:27 AM

## 2022-09-15 NOTE — Plan of Care (Signed)
  Problem: Pain Managment: Goal: General experience of comfort will improve Outcome: Progressing   Problem: Safety: Goal: Ability to remain free from injury will improve Outcome: Progressing   Problem: Skin Integrity: Goal: Risk for impaired skin integrity will decrease Outcome: Progressing   

## 2022-09-15 NOTE — Progress Notes (Addendum)
Received call from [place] with [facility]. She reports that they can accept pt today if his daughter agrees. Contacted pt's daughter [name]) at [phone] and left a VM. Notified Dr. Renford Dills and RN that pt can be DC today to [facility] if his daughter agrees.  1:48 - # 2 call - Daughter did not return my call. Contacted [name] again and left a VM regarding bed offer at [facility]. Will continue to f/u.  2:55 - Received call from [place]. Informed Irving Burton that daughter is not responding to my calls. She reports that she will call back tomorrow.

## 2022-09-16 DIAGNOSIS — G9340 Encephalopathy, unspecified: Secondary | ICD-10-CM | POA: Diagnosis not present

## 2022-09-16 LAB — GLUCOSE, CAPILLARY
Glucose-Capillary: 164 mg/dL — ABNORMAL HIGH (ref 70–99)
Glucose-Capillary: 57 mg/dL — ABNORMAL LOW (ref 70–99)
Glucose-Capillary: 107 mg/dL — ABNORMAL HIGH (ref 70–99)
Glucose-Capillary: 173 mg/dL — ABNORMAL HIGH (ref 70–99)

## 2022-09-16 NOTE — Progress Notes (Signed)
Hypoglycemic Event  CBG: 57  Treatment: 8 oz juice/soda; 75% meal eaten  Symptoms: None  Possible Reasons for Event: Inadequate meal intake      Harry [name]

## 2022-09-16 NOTE — TOC Progression Note (Addendum)
Transition of Care [facility]) - Progression Note    Patient Details  Name: Harry [name] MRN: [id number] Date of Birth: May 30, 1945  [facility] of [facility]) CM/SW Contact  Lorri Frederick, LCSW Phone Number: 09/16/2022, 1:05 PM  Clinical Narrative:   CSW attempted to call [name] and son Antonio regarding DC plan: [facility] vs [facility].  No answer by either one of them.  Left messages for both.   1440: TC daughter [name].  She would like for pt to DC to [facility].  She is specifically asking for DC tomorrow.  MD informed, [facility] informed.      Expected Discharge Plan: Skilled Nursing Facility Barriers to Discharge: Continued Medical Work up  Expected Discharge Plan and Services       Living arrangements for the past 2 months: Skilled Nursing Facility                                       Social Determinants of Health (SDOH) Interventions SDOH Screenings   Food Insecurity: No Food Insecurity (09/07/2022)  Housing: Patient Unable To Answer (09/07/2022)  Transportation Needs: No Transportation Needs (09/07/2022)  Utilities: Not At Risk (09/07/2022)  Depression (PHQ2-9): Low Risk  (03/01/2020)  Financial Resource Strain: Low Risk  (09/29/2018)  Physical Activity: Inactive (09/29/2018)  Tobacco Use: Medium Risk (09/06/2022)    Readmission Risk Interventions     No data to display

## 2022-09-16 NOTE — Progress Notes (Addendum)
PROGRESS NOTE  Harry [name]  XBJ:[id number] DOB: 24-Jan-1946 DOA: 09/06/2022 PCP: System, Provider Not In   Brief Narrative: Patient is 77 year old male with with history of advanced dementia, CKD stage III, Type II, anemia, hypertension, hyperlipidemia, peripheral vascular disease, depression who presented with altered mental status.  On presentation,he was found to be dehydrated, altered, lab work showed AKI.  Hospital course remarkable for finding of bacteremia with  GRANULICATELLA ADIACENS .  ID consulted.  Started on vancomycin.  Cardiology also consulted for consideration of TEE  for bacteremia but deferred due to his poor mentation.  Now the plan is to continue 6 weeks of IV antibiotics.  PT/OT recommending skilled facility on discharge.  Daughter is requesting for [facility].  TOC following.  Medically stable for discharge whenever possible  Assessment & Plan:  Principal Problem:   Acute encephalopathy Active Problems:   Type 2 diabetes mellitus with stage 3 chronic kidney disease, with long-term current use of insulin (HCC)   Essential hypertension   PAD (peripheral artery disease) (HCC)   S/P peripheral artery angioplasty   Acute renal failure superimposed on stage 3a chronic kidney disease (HCC)   Lewy body dementia (HCC)   History of CVA (cerebrovascular accident)   Sepsis (HCC)   Normocytic anemia   Hyperlipidemia   Dementia (HCC)   GI bleed   Heme positive stool   Acute on chronic anemia   Antiplatelet or antithrombotic long-term use   Anemia   Folate deficiency   Bacteremia  Granulicatella bacteremia : Sepsis physiology has resolved.  Currently on IV vancomycin.  ID were following.  Plan for TEE to rule out endocarditis but due to mentation, not done .  ID recommending 6 weeks of IV vancomycin till 8/19.S/p picc line placement  Suspected pneumonia: Currently on room air.  Already on antibiotics. CT imagings showed bilateral lower lobe bronchial wall thickening with  ground-glass attenuation and thickening of the peribronchovascular interstitium,concerning for bronchopneumonia.Also showed  1 cm right upper lobe pulmonary nodule, increased in size from 6 mm on 06/02/2022. We recommend outpatient follow up with pulmonary medicine or thoracic surgery after dc  Advanced dementia: History of Lewy body dementia.  Presented with more confusion from baseline, altered mentation.  Continue supportive care, delirium precautions. Also has history of CVA/peripheral vascular disease.  Was on aspirin and Plavix at home which are on hold. CT/MRI of the brain negative for acute findings.  On Mestinon at home.We resumed plavix  Diabetes type 2: On insulin at home.  Currently on sliding scale.  Monitor blood sugars  Normocytic anemia: No evidence of active bleeding.  GI was following but has signed off.  Continue to monitor hemoglobin intermittently.  FOBT was positive  AKI on CKD stage IIIa:   Presented with creatinine in the range of 2.4.  Currently kidney function  at baseline. Patient found to have retained urine on presentation, Foley placed.  Given voiding trial.  He is passing urine without any problem  Deconditioning/debility: PT/OT recommending skilled nursing facility on discharge.  TOC consulted.  Goals of care: Elderly patient with multiple comorbidities.  Palliative care consulted for goals of care but deferred by family with intention of full scope of treatment.  Plan is to transfer him to LTAC.  Addendum: Patient's daughter [name] again called this afternoon for update,call not received  Nutrition Problem: Inadequate oral intake Etiology: lethargy/confusion, poor appetite    DVT prophylaxis:SCDs Start: 09/06/22 1341     Code Status: Full Code  Family Communication: Called  daughter [name]  today,call not received.Calling daughter everyday  Patient status:Inpatient  Patient is from :home  Anticipated discharge to:SNF  Estimated DC date:whenever  possible   Consultants: ID, cardiology, GI  Procedures: None  Antimicrobials:  Anti-infectives (From admission, onward)    Start     Dose/Rate Route Frequency Ordered Stop   09/14/22 1400  vancomycin (VANCOREADY) IVPB 1500 mg/300 mL        1,500 mg 150 mL/hr over 120 Minutes Intravenous Every 24 hours 09/13/22 1930     09/10/22 1200  vancomycin (VANCOREADY) IVPB 1750 mg/350 mL  Status:  Discontinued        1,750 mg 175 mL/hr over 120 Minutes Intravenous Every 24 hours 09/10/22 0953 09/13/22 1930   09/10/22 1200  ceFEPIme (MAXIPIME) 2 g in sodium chloride 0.9 % 100 mL IVPB  Status:  Discontinued        2 g 200 mL/hr over 30 Minutes Intravenous Every 8 hours 09/10/22 0953 09/10/22 1027   09/08/22 1315  ceFEPIme (MAXIPIME) 2 g in sodium chloride 0.9 % 100 mL IVPB  Status:  Discontinued        2 g 200 mL/hr over 30 Minutes Intravenous Every 12 hours 09/08/22 1304 09/10/22 0953   09/08/22 1300  vancomycin (VANCOREADY) IVPB 1500 mg/300 mL  Status:  Discontinued        1,500 mg 150 mL/hr over 120 Minutes Intravenous Every 24 hours 09/08/22 1212 09/10/22 0953   09/07/22 1400  ceFEPIme (MAXIPIME) 2 g in sodium chloride 0.9 % 100 mL IVPB  Status:  Discontinued        2 g 200 mL/hr over 30 Minutes Intravenous Every 24 hours 09/06/22 1250 09/08/22 1304   09/07/22 1230  vancomycin (VANCOREADY) IVPB 750 mg/150 mL  Status:  Discontinued        750 mg 150 mL/hr over 60 Minutes Intravenous Every 24 hours 09/07/22 1139 09/08/22 1212   09/06/22 2200  metroNIDAZOLE (FLAGYL) IVPB 500 mg  Status:  Discontinued        500 mg 100 mL/hr over 60 Minutes Intravenous Every 12 hours 09/06/22 1344 09/10/22 1027   09/06/22 1250  vancomycin variable dose per unstable renal function (pharmacist dosing)  Status:  Discontinued         Does not apply See admin instructions 09/06/22 1250 09/07/22 1159   09/06/22 1015  metroNIDAZOLE (FLAGYL) IVPB 500 mg        500 mg 100 mL/hr over 60 Minutes Intravenous  Once  09/06/22 1002 09/06/22 1216   09/06/22 1015  ceFEPIme (MAXIPIME) 2 g in sodium chloride 0.9 % 100 mL IVPB        2 g 200 mL/hr over 30 Minutes Intravenous  Once 09/06/22 1010 09/06/22 1133   09/06/22 1015  vancomycin (VANCOREADY) IVPB 1500 mg/300 mL        1,500 mg 150 mL/hr over 120 Minutes Intravenous  Once 09/06/22 1010 09/06/22 1436       Subjective: Patient seen and examined the bedside today.  Hemodynamically stable.  Lying in bed.  Same as yesterday.  Not in any Distress.  Awake  but confused  Objective: Vitals:   09/16/22 0300 09/16/22 0500 09/16/22 0600 09/16/22 0741  BP: (!) 172/81 (!) 192/81 (!) 179/78 (!) 191/94  Pulse: 74   78  Resp: 13 13 14 18   Temp: 97.8 F (36.6 C)   98 F (36.7 C)  TempSrc: Oral   Axillary  SpO2: 100%   99%  Weight:  85.7  kg    Height:        Intake/Output Summary (Last 24 hours) at 09/16/2022 0934 Last data filed at 09/16/2022 0600 Gross per 24 hour  Intake 780 ml  Output 1200 ml  Net -420 ml   Filed Weights   09/13/22 0526 09/14/22 0500 09/16/22 0500  Weight: 87.8 kg 84.1 kg 85.7 kg    Examination:  General exam: Overall comfortable, not in distress,weak, deconditioned, pleasantly confused HEENT: Cataract on  left eye Respiratory system:  no wheezes or crackles  Cardiovascular system: S1 & S2 heard, RRR.  Gastrointestinal system: Abdomen is nondistended, soft and nontender. Central nervous system: Awake but not oriented Extremities: No edema, no clubbing ,no cyanosis Skin: No rashes, no ulcers,no icterus       Data Reviewed: I have personally reviewed following labs and imaging studies  CBC: Recent Labs  Lab 09/10/22 0134 09/11/22 0109 09/13/22 0251 09/15/22 0126  WBC 9.4 5.3 6.7 5.8  NEUTROABS 6.6 2.5  --   --   HGB 8.8* 8.2* 8.1* 8.3*  HCT 27.2* 25.9* 25.8* 26.7*  MCV 95.8 92.5 93.5 93.4  PLT 156 168 222 256   Basic Metabolic Panel: Recent Labs  Lab 09/10/22 0134 09/11/22 0109 09/13/22 0251 09/14/22 0149  09/15/22 0126  NA 140 139 138 136 137  K 3.5 3.5 3.2* 3.7 3.7  CL 110 110 105 107 108  CO2 23 21* 22 23 22   GLUCOSE 196* 258* 177* 227* 211*  BUN 14 18 14 12 11   CREATININE 0.92 1.00 1.17 0.88 1.02  CALCIUM 7.6* 7.7* 7.9* 7.7* 7.9*  MG 1.5* 2.0  --   --   --   PHOS 1.5* 3.2  --   --   --      Recent Results (from the past 240 hour(s))  Blood Culture (routine x 2)     Status: Abnormal   Collection Time: 09/06/22 10:01 AM   Specimen: BLOOD RIGHT FOREARM  Result Value Ref Range Status   Specimen Description BLOOD RIGHT FOREARM  Final   Special Requests   Final    BOTTLES DRAWN AEROBIC AND ANAEROBIC Blood Culture adequate volume   Culture  Setup Time   Final    GRAM POSITIVE COCCI ANAEROBIC BOTTLE ONLY CRITICAL RESULT CALLED TO, READ BACK BY AND VERIFIED WITH: PHARMD K. AHMEND [id number] @2100  FH    Culture (A)  Final    GRANULICATELLA ADIACENS Standardized susceptibility testing for this organism is not available. Performed at [facility], [address] [place], [place] [address]    Report Status 09/10/2022 FINAL  Final  Blood Culture ID Panel (Reflexed)     Status: None   Collection Time: 09/06/22 10:01 AM  Result Value Ref Range Status   Enterococcus faecalis NOT DETECTED NOT DETECTED Final   Enterococcus Faecium NOT DETECTED NOT DETECTED Final   Listeria monocytogenes NOT DETECTED NOT DETECTED Final   Staphylococcus species NOT DETECTED NOT DETECTED Final   Staphylococcus aureus (BCID) NOT DETECTED NOT DETECTED Final   Staphylococcus epidermidis NOT DETECTED NOT DETECTED Final   Staphylococcus lugdunensis NOT DETECTED NOT DETECTED Final   Streptococcus species NOT DETECTED NOT DETECTED Final   Streptococcus agalactiae NOT DETECTED NOT DETECTED Final   Streptococcus pneumoniae NOT DETECTED NOT DETECTED Final   Streptococcus pyogenes NOT DETECTED NOT DETECTED Final   A.calcoaceticus-baumannii NOT DETECTED NOT DETECTED Final   Bacteroides fragilis NOT DETECTED NOT  DETECTED Final   Enterobacterales NOT DETECTED NOT DETECTED Final  Enterobacter cloacae complex NOT DETECTED NOT DETECTED Final   Escherichia coli NOT DETECTED NOT DETECTED Final   Klebsiella aerogenes NOT DETECTED NOT DETECTED Final   Klebsiella oxytoca NOT DETECTED NOT DETECTED Final   Klebsiella pneumoniae NOT DETECTED NOT DETECTED Final   Proteus species NOT DETECTED NOT DETECTED Final   Salmonella species NOT DETECTED NOT DETECTED Final   Serratia marcescens NOT DETECTED NOT DETECTED Final   Haemophilus influenzae NOT DETECTED NOT DETECTED Final   Neisseria meningitidis NOT DETECTED NOT DETECTED Final   Pseudomonas aeruginosa NOT DETECTED NOT DETECTED Final   Stenotrophomonas maltophilia NOT DETECTED NOT DETECTED Final   Candida albicans NOT DETECTED NOT DETECTED Final   Candida auris NOT DETECTED NOT DETECTED Final   Candida glabrata NOT DETECTED NOT DETECTED Final   Candida krusei NOT DETECTED NOT DETECTED Final   Candida parapsilosis NOT DETECTED NOT DETECTED Final   Candida tropicalis NOT DETECTED NOT DETECTED Final   Cryptococcus neoformans/gattii NOT DETECTED NOT DETECTED Final    Comment: Performed at [facility], [address] [place], [place] [address]  SARS Coronavirus 2 by RT PCR (hospital order, performed in [facility] hospital lab) *cepheid single result test* Anterior Nasal Swab     Status: None   Collection Time: 09/06/22 10:02 AM   Specimen: Anterior Nasal Swab  Result Value Ref Range Status   SARS Coronavirus 2 by RT PCR NEGATIVE NEGATIVE Final    Comment: Performed at [facility], [address] [place], [place] [address]  Urine Culture     Status: Abnormal   Collection Time: 09/06/22 10:03 AM   Specimen: Urine, Catheterized  Result Value Ref Range Status   Specimen Description URINE, CATHETERIZED  Final   Special Requests   Final    NONE Performed at [facility], [address], [place], [place] [address]    Culture MULTIPLE  SPECIES PRESENT, SUGGEST RECOLLECTION (A)  Final   Report Status 09/07/2022 FINAL  Final  Blood Culture (routine x 2)     Status: None   Collection Time: 09/06/22 10:06 AM   Specimen: BLOOD  Result Value Ref Range Status   Specimen Description BLOOD SITE NOT SPECIFIED  Final   Special Requests   Final    BOTTLES DRAWN AEROBIC AND ANAEROBIC Blood Culture adequate volume   Culture   Final    NO GROWTH 5 DAYS Performed at [facility], [address], [place], [place] [address]    Report Status 09/11/2022 FINAL  Final  Culture, OB Urine     Status: None   Collection Time: 09/10/22 10:28 AM   Specimen: Urine, Random  Result Value Ref Range Status   Specimen Description URINE, RANDOM  Final   Special Requests NONE  Final   Culture   Final    NO GROWTH NO GROUP B STREP (S.AGALACTIAE) ISOLATED Performed at [facility], [address] [place], [place] [address]    Report Status 09/11/2022 FINAL  Final  Culture, blood (Routine X 2) w Reflex to ID Panel     Status: None   Collection Time: 09/10/22 10:47 AM   Specimen: BLOOD RIGHT HAND  Result Value Ref Range Status   Specimen Description BLOOD RIGHT HAND  Final   Special Requests   Final    BOTTLES DRAWN AEROBIC AND ANAEROBIC Blood Culture adequate volume   Culture   Final    NO GROWTH 5 DAYS Performed at [facility],  [address], [place], [place] [address]    Report Status 09/15/2022 FINAL  Final  Culture, blood (Routine X 2) w Reflex to ID Panel     Status: None   Collection Time: 09/10/22 10:50 AM   Specimen: BLOOD LEFT HAND  Result Value Ref Range Status   Specimen Description BLOOD LEFT HAND  Final   Special Requests   Final    BOTTLES DRAWN AEROBIC AND ANAEROBIC Blood Culture adequate volume   Culture   Final    NO GROWTH 5 DAYS Performed at [facility], [address], [place], [place] [address]    Report Status 09/15/2022 FINAL  Final     Radiology Studies: No results  found.  Scheduled Meds:  carvedilol  12.5 mg Oral BID WC   Chlorhexidine Gluconate Cloth  6 each Topical Daily   clopidogrel  75 mg Oral Daily   feeding supplement (GLUCERNA SHAKE)  237 mL Oral TID BM   folic acid  1 mg Oral Daily   insulin aspart  0-5 Units Subcutaneous QHS   insulin aspart  0-9 Units Subcutaneous TID WC   losartan  50 mg Oral Daily   multivitamin  1 tablet Oral QHS   pantoprazole (PROTONIX) IV  40 mg Intravenous Q12H   pyridostigmine  30 mg Oral Q8H   rosuvastatin  5 mg Oral QHS   sertraline  25 mg Oral Daily   sodium chloride flush  10-40 mL Intracatheter Q12H   sodium chloride flush  3 mL Intravenous Q12H   tamsulosin  0.4 mg Oral Daily   Continuous Infusions:  vancomycin 1,500 mg (09/15/22 1326)     LOS: 10 days   Burnadette Pop, MD [facility] P7/14/2024, 9:34 AM

## 2022-09-17 DIAGNOSIS — G9340 Encephalopathy, unspecified: Secondary | ICD-10-CM | POA: Diagnosis not present

## 2022-09-17 LAB — BASIC METABOLIC PANEL
Anion gap: 4 — ABNORMAL LOW (ref 5–15)
BUN: 15 mg/dL (ref 8–23)
CO2: 25 mmol/L (ref 22–32)
Calcium: 7.6 mg/dL — ABNORMAL LOW (ref 8.9–10.3)
Chloride: 104 mmol/L (ref 98–111)
Creatinine, Ser: 1.01 mg/dL (ref 0.61–1.24)
GFR, Estimated: >60 mL/min (ref >60)
Glucose, Bld: 188 mg/dL — ABNORMAL HIGH (ref 70–99)
Potassium: 3.8 mmol/L (ref 3.5–5.1)
Sodium: 133 mmol/L — ABNORMAL LOW (ref 135–145)

## 2022-09-17 LAB — GLUCOSE, CAPILLARY
Glucose-Capillary: 151 mg/dL — ABNORMAL HIGH (ref 70–99)
Glucose-Capillary: 205 mg/dL — ABNORMAL HIGH (ref 70–99)

## 2022-09-17 LAB — CBC
HCT: 26.4 % — ABNORMAL LOW (ref 39.0–52.0)
Hemoglobin: 8.4 g/dL — ABNORMAL LOW (ref 13.0–17.0)
MCH: 29.8 pg (ref 26.0–34.0)
MCHC: 31.8 g/dL (ref 30.0–36.0)
MCV: 93.6 fL (ref 80.0–100.0)
Platelets: 290 10*3/uL (ref 150–400)
RBC: 2.82 MIL/uL — ABNORMAL LOW (ref 4.22–5.81)
RDW: 17 % — ABNORMAL HIGH (ref 11.5–15.5)
WBC: 6.3 10*3/uL (ref 4.0–10.5)
nRBC: 0 % (ref 0.0–0.2)

## 2022-09-17 MED ORDER — PANTOPRAZOLE SODIUM 40 MG PO TBEC: 40.0000 mg | DELAYED_RELEASE_TABLET | Freq: Two times a day (BID) | ORAL | Status: DC

## 2022-09-17 MED ORDER — GLUCERNA SHAKE PO LIQD: 237.0000 mL | Freq: Three times a day (TID) | ORAL | Status: DC

## 2022-09-17 MED ORDER — AMLODIPINE BESYLATE 10 MG PO TABS: 10.0000 mg | ORAL_TABLET | Freq: Every day | ORAL | Status: DC

## 2022-09-17 MED ORDER — RENA-VITE PO TABS: 1.0000 | ORAL_TABLET | Freq: Every day | ORAL | Status: DC

## 2022-09-17 MED ORDER — LOSARTAN POTASSIUM 50 MG PO TABS: 50.0000 mg | ORAL_TABLET | Freq: Every day | ORAL | Status: DC

## 2022-09-17 MED ORDER — AMLODIPINE BESYLATE 10 MG PO TABS
10.0000 mg | ORAL_TABLET | Freq: Every day | ORAL | Status: DC
  Administered 2022-09-17: 10 mg via ORAL
  Filled 2022-09-17: qty 1

## 2022-09-17 MED ORDER — FOLIC ACID 1 MG PO TABS: 1.0000 mg | ORAL_TABLET | Freq: Every day | ORAL | Status: DC

## 2022-09-17 NOTE — TOC Transition Note (Signed)
Transition of Care (TOC) - CM/SW Discharge Note Donn Pierini RN, BSN Transitions of Care Unit 4E- RN Case Manager See Treatment Team for direct phone #   Patient Details  Name: Harry [name] MRN: [id number] Date of Birth: [birth year]  Transition of Care [facility]) CM/SW Contact:  Darrold Span, RN Phone Number: 09/17/2022, 12:21 PM   Clinical Narrative:    Pt stable for transition to [facility] as per daughter choice- confirmed 7/14 per CSW note.   CM has confirmed with [facility] liaison that they can accept pt in transfer today with bed available.  1050- spoke with Lauren Liaison at [facility]: Pt has been assigned room # 327 Accepted MD- Kristeen Miss  RN # to call report- [phone] or [phone].   [facility] is requesting MD to call their provider for handoff- contact # [phone]- attending provided info and voiced that he would be happy to speak with [facility] provider if he had any questions after receiving D/C summary.   1136- [facility] called for transport to [facility]- ETA per dispatch for pickup is around 1230pm.- pt will go with PICC line for 6wks of IV abx, Paperwork placed on chart   No further TOC needs noted. [facility] liaison updated on transport timing.    Final next level of care: Long Term Acute Care (LTAC) Barriers to Discharge: Barriers Resolved   Patient Goals and CMS Choice CMS Medicare.gov Compare Post Acute Care list provided to:: Patient Represenative (must comment) Choice offered to / list presented to : Adult Children  Discharge Placement                 LTACH- [facility]        Discharge Plan and Services Additional resources added to the After Visit Summary for   In-house Referral: Clinical Social Work Discharge Planning Services: CM Consult Post Acute Care Choice: Skilled Nursing Facility, Long Term Acute Care (LTAC)          DME Arranged: N/A DME Agency: NA       HH Arranged: NA HH Agency: NA        Social Determinants of  Health (SDOH) Interventions SDOH Screenings   Food Insecurity: No Food Insecurity (09/07/2022)  Housing: Patient Unable To Answer (09/07/2022)  Transportation Needs: No Transportation Needs (09/07/2022)  Utilities: Not At Risk (09/07/2022)  Depression (PHQ2-9): Low Risk  (03/01/2020)  Financial Resource Strain: Low Risk  (09/29/2018)  Physical Activity: Inactive (09/29/2018)  Tobacco Use: Medium Risk (09/06/2022)     Readmission Risk Interventions    09/17/2022   12:21 PM  Readmission Risk Prevention Plan  Transportation Screening Complete  PCP or Specialist Appt within 3-5 Days Complete  HRI or Home Care Consult Complete  Social Work Consult for Recovery Care Planning/Counseling Complete  Palliative Care Screening Not Applicable  Medication Review Oceanographer) Complete

## 2022-09-17 NOTE — Discharge Summary (Signed)
Physician Discharge Summary  Harry [name] ZOX:[id number] DOB: [birth year] DOA: 09/06/2022  PCP: System, Provider Not In  Admit date: 09/06/2022 Discharge date: 09/17/2022  Admitted From: Home Disposition:  Home  Discharge Condition:Stable CODE STATUS:FULL Diet recommendation: Dysphagia 1  Brief/Interim Summary: Patient is 77 year old male with with history of advanced dementia, CKD stage III, Type II, anemia, hypertension, hyperlipidemia, peripheral vascular disease, depression who presented with altered mental status.  On presentation,he was found to be dehydrated, altered, lab work showed AKI.  Hospital course remarkable for finding of bacteremia with  GRANULICATELLA ADIACENS .  ID consulted.  Started on vancomycin.  Cardiology also consulted for consideration of TEE  for bacteremia but deferred due to his poor mentation.  Now the plan is to continue 6 weeks of IV antibiotics.  PT/OT recommending skilled facility on discharge. Now the plan is to discharge to [facility] .  Medically stable for discharge whenever possible   Following problems were addressed during the hospitalization:  Granulicatella bacteremia : Sepsis physiology has resolved.  Currently on IV vancomycin.  ID were following.  Plan for TEE to rule out endocarditis but due to mentation, not done .  ID recommending 6 weeks of IV vancomycin till 8/19.S/p picc line placement.  Patient will follow-up with infectious disease as an outpatient.   Suspected pneumonia: Currently on room air.  Already on antibiotics. CT imagings showed bilateral lower lobe bronchial wall thickening with ground-glass attenuation and thickening of the peribronchovascular interstitium,concerning for bronchopneumonia.Also showed  1 cm right upper lobe pulmonary nodule, increased in size from 6 mm on 06/02/2022. We recommend outpatient follow up with pulmonary medicine or thoracic surgery after dc   Advanced dementia: History of Lewy body dementia.  Presented with  more confusion from baseline, altered mentation.  Continue supportive care, delirium precautions. Also has history of CVA/peripheral vascular disease.  Was on aspirin and Plavix at home which are on hold. CT/MRI of the brain negative for acute findings.  On Mestinon at home.We resumed plavix   Diabetes type 2: Continue home regimen on discharge.  On insulin  Normocytic anemia: No evidence of active bleeding.  GI was following but has signed off.  Continue to monitor hemoglobin intermittently.  FOBT was positive.  Continue Protonix   AKI on CKD stage IIIa:   Presented with creatinine in the range of 2.4.  Currently kidney function  at baseline. Patient found to have retained urine on presentation, Foley placed.  Given voiding trial.  He is passing urine without any problem   Deconditioning/debility: PT/OT recommending skilled nursing facility on discharge.  TOC consulted.   Goals of care: Elderly patient with multiple comorbidities.  Palliative care consulted for goals of care but deferred by family with intention of full scope of treatment.  Plan is to transfer him to [facility].  Discharge Diagnoses:  Principal Problem:   Acute encephalopathy Active Problems:   Type 2 diabetes mellitus with stage 3 chronic kidney disease, with long-term current use of insulin (HCC)   Essential hypertension   PAD (peripheral artery disease) (HCC)   S/P peripheral artery angioplasty   Acute renal failure superimposed on stage 3a chronic kidney disease (HCC)   Lewy body dementia (HCC)   History of CVA (cerebrovascular accident)   Sepsis (HCC)   Normocytic anemia   Hyperlipidemia   Dementia (HCC)   GI bleed   Heme positive stool   Acute on chronic anemia   Antiplatelet or antithrombotic long-term use   Anemia   Folate deficiency  Bacteremia    Discharge Instructions  Discharge Instructions     Diet general   Complete by: As directed    Dysphagia 1 diet   Discharge instructions   Complete by:  As directed    1)Please take prescribed medications as instructed 2)Do a CBC and BMP testing in a week 3)Follow up with infectious disease as an outpatient   Increase activity slowly   Complete by: As directed       Allergies as of 09/17/2022       Reactions   Lipitor [atorvastatin] Other (See Comments)   Myalgias   Statins Other (See Comments)   Myalgias  Pt tolerating rosuvastatin 5mg  QD   Pravachol [pravastatin] Rash        Medication List     STOP taking these medications    aspirin EC 81 MG tablet   metFORMIN 500 MG 24 hr tablet Commonly known as: GLUCOPHAGE-XR       TAKE these medications    amLODipine 10 MG tablet Commonly known as: NORVASC Take 1 tablet (10 mg total) by mouth daily. Start taking on: September 18, 2022   carvedilol 12.5 MG tablet Commonly known as: COREG Take 1 tablet (12.5 mg total) by mouth 2 (two) times daily with a meal.   clopidogrel 75 MG tablet Commonly known as: PLAVIX Take 75 mg by mouth daily.   docusate sodium 100 MG capsule Commonly known as: COLACE Take 100 mg by mouth 2 (two) times daily.   feeding supplement (GLUCERNA SHAKE) Liqd Take 237 mLs by mouth 3 (three) times daily between meals.   ferrous gluconate 324 MG tablet Commonly known as: FERGON Take 324 mg by mouth every evening.   folic acid 1 MG tablet Commonly known as: FOLVITE Take 1 tablet (1 mg total) by mouth daily. Start taking on: September 18, 2022   GenTeal Tears Severe Day/Night 0.4-0.3 % Gel ophthalmic gel Generic drug: Polyethyl Glycol-Propyl Glycol Place 1 Application into both eyes 3 (three) times daily.   insulin lispro 100 UNIT/ML injection Commonly known as: HUMALOG Inject 0-12 Units into the skin See admin instructions. Inject 0-12 units three times daily before meals per sliding scale: BG < 70, call NP/PA. BG 70 - 200 : 0 units BG 201-250 : 2 units BG 251-300 : 4 units BG 301-350 : 6 units BG 351-400 : 8 units BG 401-450 : 10 units BG  451-500 : 12 units BG > 500 : 12 units, recheck in 2 hours. If still > 350 or < 100 notify provider BG is 451 to 600 give 12 units.   Lantus SoloStar 100 UNIT/ML Solostar Pen Generic drug: insulin glargine Inject 13 Units into the skin at bedtime.   losartan 50 MG tablet Commonly known as: COZAAR Take 1 tablet (50 mg total) by mouth daily. Start taking on: September 18, 2022 What changed:  medication strength how much to take when to take this reasons to take this   multivitamin Tabs tablet Take 1 tablet by mouth at bedtime.   pantoprazole 40 MG tablet Commonly known as: Protonix Take 1 tablet (40 mg total) by mouth 2 (two) times daily.   polyethylene glycol powder 17 GM/SCOOP powder Commonly known as: GLYCOLAX/MIRALAX Take 17 g by mouth daily in the afternoon.   PURIFIED WATER PO Take 8-16 fluid ounces by mouth 3 (three) times daily. Please record amount consumed, to promote renal function.   pyridostigmine 60 MG tablet Commonly known as: MESTINON Take 0.5 tablets (30 mg total)  by mouth every 8 (eight) hours.   rosuvastatin 5 MG tablet Commonly known as: CRESTOR Take 5 mg by mouth at bedtime.   sertraline 25 MG tablet Commonly known as: ZOLOFT Take 25 mg by mouth daily.   tamsulosin 0.4 MG Caps capsule Commonly known as: FLOMAX Take 1 capsule (0.4 mg total) by mouth daily.        Contact information for after-discharge care     Destination     [facility] Preferred SNF .   Service: Skilled Nursing Contact information: [address] [place] [address] [phone]                    Allergies  Allergen Reactions   Lipitor [Atorvastatin] Other (See Comments)    Myalgias   Statins Other (See Comments)    Myalgias  Pt tolerating rosuvastatin 5mg  QD   Pravachol [Pravastatin] Rash    Consultations: ID   Procedures/Studies: [place] EKG SITE RITE  Result Date: 09/13/2022 If Site Rite image not  attached, placement could not be confirmed due to current cardiac rhythm.  ECHOCARDIOGRAM COMPLETE  Result Date: 09/11/2022    ECHOCARDIOGRAM REPORT   Patient Name:   ZACHERY NISWANDER Date of Exam: 09/11/2022 Medical Rec #:  [id number]        Height:       68.0 in Accession #:    [id number]       Weight:       186.7 lb Date of Birth:  04-10-1945         BSA:          1.985 m Patient Age:    76 years         BP:           106/63 mmHg Patient Gender: M                HR:           71 bpm. Exam Location:  Inpatient Procedure: 2D Echo, Cardiac Doppler, Color Doppler and Strain Analysis Indications:    Bacteremia R78.81  History:        Patient has prior history of Echocardiogram examinations, most                 recent 06/03/2022. Stroke and AMS, Anemia, CKD,                 Signs/Symptoms:Hypotension; Risk Factors:Hypertension, Diabetes                 and Dyslipidemia.  Sonographer:    Jake Seats RDMS, RVT, RDCS Referring Phys: [id number] TRUNG T VU IMPRESSIONS  1. Left ventricular ejection fraction, by estimation, is 60 to 65%. The left ventricle has normal function. The left ventricle has no regional wall motion abnormalities. There is mild concentric left ventricular hypertrophy. Left ventricular diastolic parameters are consistent with Grade I diastolic dysfunction (impaired relaxation). Elevated left atrial pressure. The average left ventricular global longitudinal strain is -19.0 %. The global longitudinal strain is normal.  2. Right ventricular systolic function is normal. The right ventricular size is normal.  3. The mitral valve is normal in structure. No evidence of mitral valve regurgitation. No evidence of mitral stenosis.  4. The aortic valve is tricuspid. There is mild calcification of the aortic valve. There is mild thickening of the aortic valve. Aortic valve regurgitation is not visualized. Aortic valve sclerosis/calcification is present, without any evidence of aortic stenosis.  5.  The inferior vena  cava is normal in size with greater than 50% respiratory variability, suggesting right atrial pressure of 3 mmHg. Comparison(s): Prior images reviewed side by side. The pericardial effusion is no longer present. Conclusion(s)/Recommendation(s): No evidence of valvular vegetations on this transthoracic echocardiogram. Consider a transesophageal echocardiogram to exclude infective endocarditis if clinically indicated. FINDINGS  Left Ventricle: Left ventricular ejection fraction, by estimation, is 60 to 65%. The left ventricle has normal function. The left ventricle has no regional wall motion abnormalities. The average left ventricular global longitudinal strain is -19.0 %. The global longitudinal strain is normal. The left ventricular internal cavity size was normal in size. There is mild concentric left ventricular hypertrophy. Left ventricular diastolic parameters are consistent with Grade I diastolic dysfunction (impaired relaxation). Elevated left atrial pressure. Right Ventricle: The right ventricular size is normal. No increase in right ventricular wall thickness. Right ventricular systolic function is normal. Left Atrium: Left atrial size was normal in size. Right Atrium: Right atrial size was normal in size. Pericardium: There is no evidence of pericardial effusion. Mitral Valve: The mitral valve is normal in structure. No evidence of mitral valve regurgitation. No evidence of mitral valve stenosis. Tricuspid Valve: The tricuspid valve is normal in structure. Tricuspid valve regurgitation is not demonstrated. No evidence of tricuspid stenosis. Aortic Valve: The aortic valve is tricuspid. There is mild calcification of the aortic valve. There is mild thickening of the aortic valve. Aortic valve regurgitation is not visualized. Aortic valve sclerosis/calcification is present, without any evidence of aortic stenosis. Aortic valve mean gradient measures 3.0 mmHg. Aortic valve peak gradient measures 5.2 mmHg.  Aortic valve area, by VTI measures 2.68 cm. Pulmonic Valve: The pulmonic valve was normal in structure. Pulmonic valve regurgitation is not visualized. No evidence of pulmonic stenosis. Aorta: The aortic root is normal in size and structure. Venous: The inferior vena cava is normal in size with greater than 50% respiratory variability, suggesting right atrial pressure of 3 mmHg. IAS/Shunts: No atrial level shunt detected by color flow Doppler.  LEFT VENTRICLE PLAX 2D LVIDd:         4.40 cm     Diastology LVIDs:         2.60 cm     LV e' medial:    4.57 cm/s LV PW:         1.30 cm     LV E/e' medial:  18.6 LV IVS:        1.30 cm     LV e' lateral:   4.68 cm/s LVOT diam:     1.90 cm     LV E/e' lateral: 18.2 LV SV:         59 LV SV Index:   30          2D Longitudinal Strain LVOT Area:     2.84 cm    2D Strain GLS (A2C):   -19.7 %                            2D Strain GLS (A3C):   -19.1 %                            2D Strain GLS (A4C):   -18.2 % LV Volumes (MOD)           2D Strain GLS Avg:     -19.0 % LV vol d, MOD A2C: 65.3 ml  LV vol d, MOD A4C: 50.3 ml LV vol s, MOD A2C: 20.3 ml LV vol s, MOD A4C: 19.5 ml LV SV MOD A2C:     45.0 ml LV SV MOD A4C:     50.3 ml LV SV MOD BP:      36.8 ml RIGHT VENTRICLE RV S prime:     12.10 cm/s TAPSE (M-mode): 2.1 cm LEFT ATRIUM             Index        RIGHT ATRIUM           Index LA diam:        3.50 cm 1.76 cm/m   RA Area:     10.10 cm LA Vol (A2C):   57.4 ml 28.92 ml/m  RA Volume:   17.20 ml  8.67 ml/m LA Vol (A4C):   43.4 ml 21.86 ml/m LA Biplane Vol: 54.6 ml 27.51 ml/m  AORTIC VALVE                    PULMONIC VALVE AV Area (Vmax):    2.43 cm     PR End Diast Vel: 4.16 msec AV Area (Vmean):   2.66 cm AV Area (VTI):     2.68 cm AV Vmax:           114.00 cm/s AV Vmean:          73.500 cm/s AV VTI:            0.221 m AV Peak Grad:      5.2 mmHg AV Mean Grad:      3.0 mmHg LVOT Vmax:         97.90 cm/s LVOT Vmean:        68.900 cm/s LVOT VTI:          0.209 m LVOT/AV VTI  ratio: 0.95  AORTA Ao Root diam: 3.80 cm Ao Asc diam:  3.60 cm MITRAL VALVE                TRICUSPID VALVE MV Area (PHT): 3.77 cm     TR Peak grad:   4.2 mmHg MV Decel Time: 201 msec     TR Vmax:        102.00 cm/s MV E velocity: 85.10 cm/s MV A velocity: 107.00 cm/s  SHUNTS MV E/A ratio:  0.80         Systemic VTI:  0.21 m                             Systemic Diam: 1.90 cm Rachelle Hora Croitoru MD Electronically signed by Thurmon Fair MD Signature Date/Time: 09/11/2022/1:35:48 PM    Final    DG CHEST PORT 1 VIEW  Result Date: 09/09/2022 CLINICAL DATA:  Shortness of breath EXAM: PORTABLE CHEST 1 VIEW COMPARISON:  09/06/2022 FINDINGS: Heart size within normal limits. Interstitial opacities within the left perihilar and left basilar region. No pleural effusion or pneumothorax. IMPRESSION: Interstitial opacities within the left perihilar and left basilar region, which may represent atelectasis, asymmetric edema, or infection. Electronically Signed   By: Duanne Guess D.O.   On: 09/09/2022 16:33   MR BRAIN WO CONTRAST  Result Date: 09/08/2022 CLINICAL DATA:  Altered mental status EXAM: MRI HEAD WITHOUT CONTRAST TECHNIQUE: Multiplanar, multiecho pulse sequences of the brain and surrounding structures were obtained without intravenous contrast. COMPARISON:  None Available. FINDINGS: Brain: No acute infarct, mass effect or extra-axial collection. Chronic  microhemorrhages in the cerebellum and brainstem. There is confluent hyperintense T2-weighted signal within the white matter. Generalized volume loss. Old bilateral cerebellar and occipital lobe infarcts. The midline structures are normal. Vascular: Major flow voids are preserved. Skull and upper cervical spine: Normal calvarium and skull base. Visualized upper cervical spine and soft tissues are normal. Sinuses/Orbits:No paranasal sinus fluid levels or advanced mucosal thickening. No mastoid or middle ear effusion. There are bilateral lens replacements. IMPRESSION: 1.  No acute intracranial abnormality. 2. Old bilateral cerebellar and occipital lobe infarcts. 3. Findings of chronic small vessel ischemia and volume loss. Electronically Signed   By: Deatra Robinson M.D.   On: 09/08/2022 02:02   CT CHEST ABDOMEN PELVIS WO CONTRAST  Result Date: 09/06/2022 CLINICAL DATA:  Sepsis EXAM: CT CHEST, ABDOMEN AND PELVIS WITHOUT CONTRAST TECHNIQUE: Multidetector CT imaging of the chest, abdomen and pelvis was performed following the standard protocol without IV contrast. RADIATION DOSE REDUCTION: This exam was performed according to the departmental dose-optimization program which includes automated exposure control, adjustment of the mA and/or kV according to patient size and/or use of iterative reconstruction technique. COMPARISON:  CT chest 06/02/2022 and CT AP from 06/30/2016 FINDINGS: CT CHEST FINDINGS Cardiovascular: Normal heart size. Aortic atherosclerosis and coronary artery calcifications. No pericardial effusion. Mediastinum/Nodes: Thyroid gland, trachea, and esophagus are normal. No mediastinal or axillary adenopathy. Hilar lymph nodes are suboptimally evaluated due to lack of IV contrast. Lungs/Pleura: No pleural effusion. Bilateral lower lobe bronchial wall thickening with ground-glass attenuation and thickening of the peribronchovascular interstitium. There is a peripheral nodule within the right upper lobe which appears subpleural measuring 1 cm, image 62/4. Previously this measured 6 mm. Musculoskeletal: No chest wall mass or suspicious bone lesions identified. CT ABDOMEN PELVIS FINDINGS Hepatobiliary: No suspicious liver abnormality. Too small to characterize hypodensity along the dome of left hepatic lobe measures 5 mm. Calcified gallstones within the dependent portion of the gallbladder measure up to 1 cm. No gallbladder wall thickening or pericholecystic inflammation. Pancreas: Unremarkable. No pancreatic ductal dilatation or surrounding inflammatory changes. Spleen:  Normal in size without focal abnormality. Adrenals/Urinary Tract: Normal adrenal glands. 1 cm right kidney cyst is identified, image 65/2. No follow-up imaging recommended. No signs of nephrolithiasis or hydronephrosis. Thick-walled urinary bladder is decompressed around a Foley catheter balloon. Stomach/Bowel: Stomach appears normal. No pathologic dilatation of the large or small bowel loops to suggest obstruction. The appendix is visualized and appears normal. Mild stool burden noted throughout the colon and rectum. Vascular/Lymphatic: Aortic atherosclerosis without aneurysm. No signs of abdominopelvic adenopathy. Reproductive: Prostate gland enlargement. Other: No ascites or focal fluid collections. No signs of pneumoperitoneum. Musculoskeletal: No acute or significant osseous findings. IMPRESSION: 1. Bilateral lower lobe bronchial wall thickening with ground-glass attenuation and thickening of the peribronchovascular interstitium. Findings are concerning for bronchopneumonia. 2. 1 cm right upper lobe pulmonary nodule, increased in size from 6 mm on 06/02/2022. Recommend referral to pulmonary medicine or thoracic surgery for further management. 3. Cholelithiasis without evidence of acute cholecystitis. 4. Prostate gland enlargement and thick-walled urinary bladder which is decompressed from a Foley catheter. 5. Aortic Atherosclerosis (ICD10-I70.0). Electronically Signed   By: Signa Kell M.D.   On: 09/06/2022 14:02   CT Head Wo Contrast  Result Date: 09/06/2022 CLINICAL DATA:  Mental status change EXAM: CT HEAD WITHOUT CONTRAST TECHNIQUE: Contiguous axial images were obtained from the base of the skull through the vertex without intravenous contrast. RADIATION DOSE REDUCTION: This exam was performed according to the departmental dose-optimization program which  includes automated exposure control, adjustment of the mA and/or kV according to patient size and/or use of iterative reconstruction technique.  COMPARISON:  06/03/2020 FINDINGS: Brain: No evidence of acute infarction, hemorrhage, extra-axial collection, ventriculomegaly, or mass effect. Generalized cerebral atrophy. Periventricular white matter low attenuation likely secondary to microangiopathy. Vascular: Cerebrovascular atherosclerotic calcifications are noted. Skull: Negative for fracture or focal lesion. Sinuses/Orbits: Visualized portions of the orbits are unremarkable. Visualized portions of the paranasal sinuses are unremarkable. Visualized portions of the mastoid air cells are unremarkable. Other: None. IMPRESSION: 1. No acute intracranial findings. 2. Chronic small vessel ischemic changes. Electronically Signed   By: Elige Ko M.D.   On: 09/06/2022 11:20   DG Chest Port 1 View  Result Date: 09/06/2022 CLINICAL DATA:  Sepsis EXAM: PORTABLE CHEST 1 VIEW COMPARISON:  06/01/2022 FINDINGS: No focal consolidation. No pleural effusion or pneumothorax. Heart and mediastinal contours are unremarkable. No acute osseous abnormality. IMPRESSION: No active disease. Electronically Signed   By: Elige Ko M.D.   On: 09/06/2022 10:53      Subjective: Patient seen and examined at bedside today.  Hemodynamically stable.  Eating his breakfast.  Slightly hypertensive.  Looks very comfortable today.  Awake, obeys commands and talks.  Discharge Exam: Vitals:   09/17/22 0600 09/17/22 0802  BP: (!) 181/79 (!) 179/76  Pulse:    Resp: 16 18  Temp:  (!) 97.4 F (36.3 C)  SpO2:     Vitals:   09/16/22 2300 09/17/22 0300 09/17/22 0600 09/17/22 0802  BP: (!) 169/80 (!) 184/90 (!) 181/79 (!) 179/76  Pulse:  71    Resp: 14 17 16 18   Temp:  98.3 F (36.8 C)  (!) 97.4 F (36.3 C)  TempSrc:  Oral  Oral  SpO2:      Weight:      Height:        General: Pt is alert, awake, not in acute distress, cataract on left eye, confused, not agitated Cardiovascular: RRR, S1/S2 +, no rubs, no gallops Respiratory: CTA bilaterally, no wheezing, no  rhonchi Abdominal: Soft, NT, ND, bowel sounds + Extremities: no edema, no cyanosis    The results of significant diagnostics from this hospitalization (including imaging, microbiology, ancillary and laboratory) are listed below for reference.     Microbiology: Recent Results (from the past 240 hour(s))  Culture, OB Urine     Status: None   Collection Time: 09/10/22 10:28 AM   Specimen: Urine, Random  Result Value Ref Range Status   Specimen Description URINE, RANDOM  Final   Special Requests NONE  Final   Culture   Final    NO GROWTH NO GROUP B STREP (S.AGALACTIAE) ISOLATED Performed at [facility], [address] [place], [place] [address]    Report Status 09/11/2022 FINAL  Final  Culture, blood (Routine X 2) w Reflex to ID Panel     Status: None   Collection Time: 09/10/22 10:47 AM   Specimen: BLOOD RIGHT HAND  Result Value Ref Range Status   Specimen Description BLOOD RIGHT HAND  Final   Special Requests   Final    BOTTLES DRAWN AEROBIC AND ANAEROBIC Blood Culture adequate volume   Culture   Final    NO GROWTH 5 DAYS Performed at [facility], [address] [place], [place] [address]    Report Status 09/15/2022 FINAL  Final  Culture, blood (Routine X 2) w Reflex to ID Panel     Status: None   Collection  Time: 09/10/22 10:50 AM   Specimen: BLOOD LEFT HAND  Result Value Ref Range Status   Specimen Description BLOOD LEFT HAND  Final   Special Requests   Final    BOTTLES DRAWN AEROBIC AND ANAEROBIC Blood Culture adequate volume   Culture   Final    NO GROWTH 5 DAYS Performed at [facility], [address] [place], [place] [address]    Report Status 09/15/2022 FINAL  Final     Labs: BNP (last 3 results) Recent Labs    06/01/22 2119  BNP 157.1*   Basic Metabolic Panel: Recent Labs  Lab 09/11/22 0109 09/13/22 0251 09/14/22 0149 09/15/22 0126 09/17/22 0205  NA 139 138 136 137 133*  K 3.5 3.2* 3.7 3.7 3.8  CL 110 105 107 108 104  CO2  21* 22 23 22 25   GLUCOSE 258* 177* 227* 211* 188*  BUN 18 14 12 11 15   CREATININE 1.00 1.17 0.88 1.02 1.01  CALCIUM 7.7* 7.9* 7.7* 7.9* 7.6*  MG 2.0  --   --   --   --   PHOS 3.2  --   --   --   --    Liver Function Tests: Recent Labs  Lab 09/11/22 0109  ALBUMIN 2.2*   No results for input(s): "LIPASE", "AMYLASE" in the last 168 hours. No results for input(s): "AMMONIA" in the last 168 hours. CBC: Recent Labs  Lab 09/11/22 0109 09/13/22 0251 09/15/22 0126 09/17/22 0205  WBC 5.3 6.7 5.8 6.3  NEUTROABS 2.5  --   --   --   HGB 8.2* 8.1* 8.3* 8.4*  HCT 25.9* 25.8* 26.7* 26.4*  MCV 92.5 93.5 93.4 93.6  PLT 168 222 256 290   Cardiac Enzymes: No results for input(s): "CKTOTAL", "CKMB", "CKMBINDEX", "TROPONINI" in the last 168 hours. BNP: Invalid input(s): "POCBNP" CBG: Recent Labs  Lab 09/16/22 0637 09/16/22 1129 09/16/22 1728 09/16/22 2028 09/17/22 0553  GLUCAP 164* 57* 107* 173* 151*   D-Dimer No results for input(s): "DDIMER" in the last 72 hours. Hgb A1c No results for input(s): "HGBA1C" in the last 72 hours. Lipid Profile No results for input(s): "CHOL", "HDL", "LDLCALC", "TRIG", "CHOLHDL", "LDLDIRECT" in the last 72 hours. Thyroid function studies No results for input(s): "TSH", "T4TOTAL", "T3FREE", "THYROIDAB" in the last 72 hours.  Invalid input(s): "FREET3" Anemia work up No results for input(s): "VITAMINB12", "FOLATE", "FERRITIN", "TIBC", "IRON", "RETICCTPCT" in the last 72 hours. Urinalysis    Component Value Date/Time   COLORURINE AMBER (A) 09/10/2022 1317   APPEARANCEUR HAZY (A) 09/10/2022 1317   LABSPEC 1.029 09/10/2022 1317   PHURINE 5.0 09/10/2022 1317   GLUCOSEU 50 (A) 09/10/2022 1317   HGBUR MODERATE (A) 09/10/2022 1317   BILIRUBINUR SMALL (A) 09/10/2022 1317   KETONESUR 5 (A) 09/10/2022 1317   PROTEINUR 100 (A) 09/10/2022 1317   UROBILINOGEN 0.2 10/09/2014 2112   NITRITE NEGATIVE 09/10/2022 1317   LEUKOCYTESUR MODERATE (A) 09/10/2022  1317   Sepsis Labs Recent Labs  Lab 09/11/22 0109 09/13/22 0251 09/15/22 0126 09/17/22 0205  WBC 5.3 6.7 5.8 6.3   Microbiology Recent Results (from the past 240 hour(s))  Culture, OB Urine     Status: None   Collection Time: 09/10/22 10:28 AM   Specimen: Urine, Random  Result Value Ref Range Status   Specimen Description URINE, RANDOM  Final   Special Requests NONE  Final   Culture   Final    NO GROWTH NO GROUP B STREP (S.AGALACTIAE) ISOLATED Performed  at [facility], [address], [place], [place] [address]    Report Status 09/11/2022 FINAL  Final  Culture, blood (Routine X 2) w Reflex to ID Panel     Status: None   Collection Time: 09/10/22 10:47 AM   Specimen: BLOOD RIGHT HAND  Result Value Ref Range Status   Specimen Description BLOOD RIGHT HAND  Final   Special Requests   Final    BOTTLES DRAWN AEROBIC AND ANAEROBIC Blood Culture adequate volume   Culture   Final    NO GROWTH 5 DAYS Performed at [facility], [address] [place], [place] [address]    Report Status 09/15/2022 FINAL  Final  Culture, blood (Routine X 2) w Reflex to ID Panel     Status: None   Collection Time: 09/10/22 10:50 AM   Specimen: BLOOD LEFT HAND  Result Value Ref Range Status   Specimen Description BLOOD LEFT HAND  Final   Special Requests   Final    BOTTLES DRAWN AEROBIC AND ANAEROBIC Blood Culture adequate volume   Culture   Final    NO GROWTH 5 DAYS Performed at [facility], [address] [place], [place] [address]    Report Status 09/15/2022 FINAL  Final    Please note: You were cared for by a hospitalist during your hospital stay. Once you are discharged, your primary care physician will handle any further medical issues. Please note that NO REFILLS for any discharge medications will be authorized once you are discharged, as it is imperative that you return to your primary care physician (or establish a relationship with a primary care physician if you do  not have one) for your post hospital discharge needs so that they can reassess your need for medications and monitor your lab values.    Time coordinating discharge: 40 minutes  SIGNED:   Burnadette Pop, MD  [facility] 09/17/2022, 10:57 AM Pager [phone]  If 7PM-7AM, please contact night-coverage [email or web address] Password [email or web address]

## 2022-09-17 NOTE — Plan of Care (Signed)
Pt D/C to [facility]. A&OX1. Pt has picc line for long term antibiotics. 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 Pt external catheter d/c. No skin issues report called. Reva Bores 09/17/22 11:12 AM

## 2022-09-18 DIAGNOSIS — D649 Anemia, unspecified: Secondary | ICD-10-CM

## 2022-09-18 DIAGNOSIS — E119 Type 2 diabetes mellitus without complications: Secondary | ICD-10-CM

## 2022-09-18 DIAGNOSIS — R5381 Other malaise: Secondary | ICD-10-CM

## 2022-09-18 DIAGNOSIS — G3183 Dementia with Lewy bodies: Secondary | ICD-10-CM

## 2022-09-18 DIAGNOSIS — N1831 Chronic kidney disease, stage 3a: Secondary | ICD-10-CM

## 2022-09-18 DIAGNOSIS — R7881 Bacteremia: Secondary | ICD-10-CM

## 2022-09-24 DIAGNOSIS — R7881 Bacteremia: Secondary | ICD-10-CM

## 2022-09-24 DIAGNOSIS — R5381 Other malaise: Secondary | ICD-10-CM

## 2022-09-24 DIAGNOSIS — D649 Anemia, unspecified: Secondary | ICD-10-CM

## 2022-09-24 DIAGNOSIS — E119 Type 2 diabetes mellitus without complications: Secondary | ICD-10-CM

## 2022-09-24 DIAGNOSIS — G3183 Dementia with Lewy bodies: Secondary | ICD-10-CM

## 2022-09-29 DIAGNOSIS — G3183 Dementia with Lewy bodies: Secondary | ICD-10-CM

## 2022-09-29 DIAGNOSIS — D649 Anemia, unspecified: Secondary | ICD-10-CM

## 2022-09-29 DIAGNOSIS — R7881 Bacteremia: Secondary | ICD-10-CM

## 2022-09-29 DIAGNOSIS — E119 Type 2 diabetes mellitus without complications: Secondary | ICD-10-CM

## 2022-09-29 DIAGNOSIS — R5381 Other malaise: Secondary | ICD-10-CM

## 2022-10-01 ENCOUNTER — Encounter (HOSPITAL_COMMUNITY): Payer: Self-pay

## 2022-10-01 DIAGNOSIS — G3183 Dementia with Lewy bodies: Secondary | ICD-10-CM

## 2022-10-01 DIAGNOSIS — N1831 Chronic kidney disease, stage 3a: Secondary | ICD-10-CM

## 2022-10-01 DIAGNOSIS — R7881 Bacteremia: Secondary | ICD-10-CM

## 2022-10-01 DIAGNOSIS — D649 Anemia, unspecified: Secondary | ICD-10-CM

## 2022-10-01 DIAGNOSIS — R5381 Other malaise: Secondary | ICD-10-CM

## 2022-10-01 DIAGNOSIS — E119 Type 2 diabetes mellitus without complications: Secondary | ICD-10-CM

## 2022-10-02 ENCOUNTER — Inpatient Hospital Stay (HOSPITAL_COMMUNITY)
Admission: EM | Admit: 2022-10-02 | Discharge: 2022-10-16 | DRG: 682 | Disposition: A | Discharge status: Skilled Nursing Facility | Payer: Medicare Other | Source: Skilled Nursing Facility | Attending: Internal Medicine | Admitting: Internal Medicine

## 2022-10-02 ENCOUNTER — Inpatient Hospital Stay (HOSPITAL_COMMUNITY): Payer: Medicare Other

## 2022-10-02 DIAGNOSIS — E1140 Type 2 diabetes mellitus with diabetic neuropathy, unspecified: Secondary | ICD-10-CM | POA: Diagnosis present

## 2022-10-02 DIAGNOSIS — Z8701 Personal history of pneumonia (recurrent): Secondary | ICD-10-CM

## 2022-10-02 DIAGNOSIS — K9422 Gastrostomy infection: Secondary | ICD-10-CM | POA: Diagnosis present

## 2022-10-02 DIAGNOSIS — R7881 Bacteremia: Secondary | ICD-10-CM | POA: Diagnosis present

## 2022-10-02 DIAGNOSIS — Z515 Encounter for palliative care: Secondary | ICD-10-CM

## 2022-10-02 DIAGNOSIS — Z794 Long term (current) use of insulin: Secondary | ICD-10-CM

## 2022-10-02 DIAGNOSIS — D509 Iron deficiency anemia, unspecified: Secondary | ICD-10-CM | POA: Diagnosis not present

## 2022-10-02 DIAGNOSIS — Z79899 Other long term (current) drug therapy: Secondary | ICD-10-CM

## 2022-10-02 DIAGNOSIS — I69959 Hemiplegia and hemiparesis following unspecified cerebrovascular disease affecting unspecified side: Secondary | ICD-10-CM

## 2022-10-02 DIAGNOSIS — L8990 Pressure ulcer of unspecified site, unspecified stage: Secondary | ICD-10-CM | POA: Diagnosis present

## 2022-10-02 DIAGNOSIS — E785 Hyperlipidemia, unspecified: Secondary | ICD-10-CM | POA: Diagnosis not present

## 2022-10-02 DIAGNOSIS — F0280 Dementia in other diseases classified elsewhere without behavioral disturbance: Secondary | ICD-10-CM | POA: Diagnosis not present

## 2022-10-02 DIAGNOSIS — E1122 Type 2 diabetes mellitus with diabetic chronic kidney disease: Secondary | ICD-10-CM | POA: Diagnosis present

## 2022-10-02 DIAGNOSIS — E860 Dehydration: Secondary | ICD-10-CM | POA: Diagnosis not present

## 2022-10-02 DIAGNOSIS — I10 Essential (primary) hypertension: Secondary | ICD-10-CM | POA: Diagnosis not present

## 2022-10-02 DIAGNOSIS — L89616 Pressure-induced deep tissue damage of right heel: Secondary | ICD-10-CM | POA: Diagnosis not present

## 2022-10-02 DIAGNOSIS — R5383 Other fatigue: Secondary | ICD-10-CM | POA: Diagnosis present

## 2022-10-02 DIAGNOSIS — Z86718 Personal history of other venous thrombosis and embolism: Secondary | ICD-10-CM

## 2022-10-02 DIAGNOSIS — A419 Sepsis, unspecified organism: Secondary | ICD-10-CM | POA: Diagnosis not present

## 2022-10-02 DIAGNOSIS — E872 Acidosis, unspecified: Secondary | ICD-10-CM | POA: Diagnosis present

## 2022-10-02 DIAGNOSIS — M7989 Other specified soft tissue disorders: Secondary | ICD-10-CM | POA: Diagnosis not present

## 2022-10-02 DIAGNOSIS — N179 Acute kidney failure, unspecified: Secondary | ICD-10-CM | POA: Diagnosis not present

## 2022-10-02 DIAGNOSIS — G3183 Dementia with Lewy bodies: Secondary | ICD-10-CM | POA: Diagnosis present

## 2022-10-02 DIAGNOSIS — L03311 Cellulitis of abdominal wall: Secondary | ICD-10-CM | POA: Diagnosis present

## 2022-10-02 DIAGNOSIS — E8809 Other disorders of plasma-protein metabolism, not elsewhere classified: Secondary | ICD-10-CM | POA: Diagnosis present

## 2022-10-02 DIAGNOSIS — Z7982 Long term (current) use of aspirin: Secondary | ICD-10-CM

## 2022-10-02 DIAGNOSIS — E870 Hyperosmolality and hypernatremia: Secondary | ICD-10-CM | POA: Diagnosis present

## 2022-10-02 DIAGNOSIS — E871 Hypo-osmolality and hyponatremia: Secondary | ICD-10-CM | POA: Diagnosis present

## 2022-10-02 DIAGNOSIS — Z833 Family history of diabetes mellitus: Secondary | ICD-10-CM

## 2022-10-02 DIAGNOSIS — D649 Anemia, unspecified: Secondary | ICD-10-CM | POA: Diagnosis present

## 2022-10-02 DIAGNOSIS — E1165 Type 2 diabetes mellitus with hyperglycemia: Secondary | ICD-10-CM | POA: Diagnosis present

## 2022-10-02 DIAGNOSIS — J90 Pleural effusion, not elsewhere classified: Secondary | ICD-10-CM | POA: Diagnosis not present

## 2022-10-02 DIAGNOSIS — J449 Chronic obstructive pulmonary disease, unspecified: Secondary | ICD-10-CM | POA: Diagnosis present

## 2022-10-02 DIAGNOSIS — R601 Generalized edema: Secondary | ICD-10-CM | POA: Diagnosis present

## 2022-10-02 DIAGNOSIS — N401 Enlarged prostate with lower urinary tract symptoms: Secondary | ICD-10-CM | POA: Diagnosis present

## 2022-10-02 DIAGNOSIS — N1832 Chronic kidney disease, stage 3b: Secondary | ICD-10-CM | POA: Diagnosis not present

## 2022-10-02 DIAGNOSIS — L89150 Pressure ulcer of sacral region, unstageable: Secondary | ICD-10-CM | POA: Diagnosis present

## 2022-10-02 DIAGNOSIS — J690 Pneumonitis due to inhalation of food and vomit: Secondary | ICD-10-CM | POA: Diagnosis present

## 2022-10-02 DIAGNOSIS — Z8673 Personal history of transient ischemic attack (TIA), and cerebral infarction without residual deficits: Secondary | ICD-10-CM

## 2022-10-02 DIAGNOSIS — R4182 Altered mental status, unspecified: Secondary | ICD-10-CM | POA: Diagnosis not present

## 2022-10-02 DIAGNOSIS — E1151 Type 2 diabetes mellitus with diabetic peripheral angiopathy without gangrene: Secondary | ICD-10-CM | POA: Diagnosis not present

## 2022-10-02 DIAGNOSIS — R109 Unspecified abdominal pain: Secondary | ICD-10-CM | POA: Diagnosis present

## 2022-10-02 DIAGNOSIS — Z888 Allergy status to other drugs, medicaments and biological substances status: Secondary | ICD-10-CM

## 2022-10-02 DIAGNOSIS — R319 Hematuria, unspecified: Secondary | ICD-10-CM | POA: Diagnosis present

## 2022-10-02 DIAGNOSIS — E876 Hypokalemia: Secondary | ICD-10-CM | POA: Diagnosis present

## 2022-10-02 DIAGNOSIS — Z7902 Long term (current) use of antithrombotics/antiplatelets: Secondary | ICD-10-CM

## 2022-10-02 DIAGNOSIS — K9423 Gastrostomy malfunction: Secondary | ICD-10-CM | POA: Diagnosis present

## 2022-10-02 DIAGNOSIS — R338 Other retention of urine: Secondary | ICD-10-CM | POA: Diagnosis present

## 2022-10-02 DIAGNOSIS — Z8249 Family history of ischemic heart disease and other diseases of the circulatory system: Secondary | ICD-10-CM

## 2022-10-02 DIAGNOSIS — H5462 Unqualified visual loss, left eye, normal vision right eye: Secondary | ICD-10-CM | POA: Diagnosis present

## 2022-10-02 DIAGNOSIS — R569 Unspecified convulsions: Secondary | ICD-10-CM | POA: Diagnosis not present

## 2022-10-02 DIAGNOSIS — I129 Hypertensive chronic kidney disease with stage 1 through stage 4 chronic kidney disease, or unspecified chronic kidney disease: Secondary | ICD-10-CM | POA: Diagnosis not present

## 2022-10-02 DIAGNOSIS — R1310 Dysphagia, unspecified: Secondary | ICD-10-CM | POA: Diagnosis present

## 2022-10-02 DIAGNOSIS — N5089 Other specified disorders of the male genital organs: Secondary | ICD-10-CM | POA: Diagnosis present

## 2022-10-02 DIAGNOSIS — Z87891 Personal history of nicotine dependence: Secondary | ICD-10-CM

## 2022-10-02 LAB — TYPE AND SCREEN
ABO/RH(D): B POS
Antibody Screen: NEGATIVE
Unit division: 0

## 2022-10-02 LAB — GLUCOSE, CAPILLARY
Glucose-Capillary: 151 mg/dL — ABNORMAL HIGH (ref 70–99)
Glucose-Capillary: 171 mg/dL — ABNORMAL HIGH (ref 70–99)
Glucose-Capillary: 179 mg/dL — ABNORMAL HIGH (ref 70–99)
Glucose-Capillary: 186 mg/dL — ABNORMAL HIGH (ref 70–99)

## 2022-10-02 LAB — BASIC METABOLIC PANEL
Anion gap: 13 (ref 5–15)
BUN: 91 mg/dL — ABNORMAL HIGH (ref 8–23)
CO2: 18 mmol/L — ABNORMAL LOW (ref 22–32)
Calcium: 8.2 mg/dL — ABNORMAL LOW (ref 8.9–10.3)
Chloride: 104 mmol/L (ref 98–111)
Creatinine, Ser: 3.41 mg/dL — ABNORMAL HIGH (ref 0.61–1.24)
GFR, Estimated: 18 mL/min — ABNORMAL LOW (ref >60)
Glucose, Bld: 184 mg/dL — ABNORMAL HIGH (ref 70–99)
Potassium: 4.1 mmol/L (ref 3.5–5.1)
Sodium: 135 mmol/L (ref 135–145)

## 2022-10-02 LAB — PHOSPHORUS: Phosphorus: 4.5 mg/dL (ref 2.5–4.6)

## 2022-10-02 LAB — HEPATIC FUNCTION PANEL
ALT: 31 U/L (ref 0–44)
AST: 24 U/L (ref 15–41)
Albumin: 2.4 g/dL — ABNORMAL LOW (ref 3.5–5.0)
Alkaline Phosphatase: 78 U/L (ref 38–126)
Bilirubin, Direct: 0.2 mg/dL (ref 0.0–0.2)
Indirect Bilirubin: 0.9 mg/dL (ref 0.3–0.9)
Total Bilirubin: 1.1 mg/dL (ref 0.3–1.2)
Total Protein: 6.5 g/dL (ref 6.5–8.1)

## 2022-10-02 LAB — C DIFFICILE QUICK SCREEN W PCR REFLEX
C Diff antigen: NEGATIVE
C Diff interpretation: NOT DETECTED
C Diff toxin: NEGATIVE

## 2022-10-02 LAB — MRSA NEXT GEN BY PCR, NASAL: MRSA by PCR Next Gen: NOT DETECTED

## 2022-10-02 LAB — CBC WITH DIFFERENTIAL/PLATELET
Abs Immature Granulocytes: 0.13 10*3/uL — ABNORMAL HIGH (ref 0.00–0.07)
Basophils Absolute: 0.1 10*3/uL (ref 0.0–0.1)
Basophils Relative: 0 %
Eosinophils Absolute: 0.1 10*3/uL (ref 0.0–0.5)
Eosinophils Relative: 1 %
HCT: 22.1 % — ABNORMAL LOW (ref 39.0–52.0)
Hemoglobin: 6.9 g/dL — CL (ref 13.0–17.0)
Immature Granulocytes: 1 %
Lymphocytes Relative: 6 %
Lymphs Abs: 1 10*3/uL (ref 0.7–4.0)
MCH: 28.3 pg (ref 26.0–34.0)
MCHC: 31.2 g/dL (ref 30.0–36.0)
MCV: 90.6 fL (ref 80.0–100.0)
Monocytes Absolute: 2 10*3/uL — ABNORMAL HIGH (ref 0.1–1.0)
Monocytes Relative: 12 %
Neutro Abs: 13.9 10*3/uL — ABNORMAL HIGH (ref 1.7–7.7)
Neutrophils Relative %: 80 %
Platelets: 326 10*3/uL (ref 150–400)
RBC: 2.44 MIL/uL — ABNORMAL LOW (ref 4.22–5.81)
RDW: 17.2 % — ABNORMAL HIGH (ref 11.5–15.5)
WBC: 17.2 10*3/uL — ABNORMAL HIGH (ref 4.0–10.5)
nRBC: 0 % (ref 0.0–0.2)

## 2022-10-02 LAB — PREPARE RBC (CROSSMATCH)

## 2022-10-02 LAB — MAGNESIUM: Magnesium: 2.2 mg/dL (ref 1.7–2.4)

## 2022-10-02 LAB — BPAM RBC
Blood Product Expiration Date: 202408232359
ISSUE DATE / TIME: 202407301749
Unit Type and Rh: 9500

## 2022-10-02 LAB — APTT: aPTT: 27 seconds (ref 24–36)

## 2022-10-02 LAB — PROTIME-INR
INR: 1.4 — ABNORMAL HIGH (ref 0.8–1.2)
Prothrombin Time: 17.4 seconds — ABNORMAL HIGH (ref 11.4–15.2)

## 2022-10-02 MED ORDER — ACETAMINOPHEN 325 MG PO TABS: 650.0000 mg | ORAL_TABLET | Freq: Four times a day (QID) | ORAL | Status: DC | PRN

## 2022-10-02 MED ORDER — INSULIN ASPART 100 UNIT/ML IJ SOLN
0.0000 [IU] | INTRAMUSCULAR | Status: DC
  Administered 2022-10-03: 1 [IU] via SUBCUTANEOUS
  Administered 2022-10-03: 2 [IU] via SUBCUTANEOUS
  Administered 2022-10-03: 1 [IU] via SUBCUTANEOUS
  Administered 2022-10-04: 2 [IU] via SUBCUTANEOUS
  Administered 2022-10-04: 1 [IU] via SUBCUTANEOUS
  Administered 2022-10-04 (×4): 2 [IU] via SUBCUTANEOUS
  Administered 2022-10-04: 1 [IU] via SUBCUTANEOUS
  Administered 2022-10-05 (×3): 2 [IU] via SUBCUTANEOUS
  Administered 2022-10-05: 3 [IU] via SUBCUTANEOUS
  Administered 2022-10-05 – 2022-10-06 (×4): 2 [IU] via SUBCUTANEOUS
  Administered 2022-10-06: 1 [IU] via SUBCUTANEOUS
  Administered 2022-10-07 (×4): 2 [IU] via SUBCUTANEOUS
  Administered 2022-10-07 – 2022-10-08 (×4): 1 [IU] via SUBCUTANEOUS
  Administered 2022-10-08: 2 [IU] via SUBCUTANEOUS
  Administered 2022-10-08 (×3): 1 [IU] via SUBCUTANEOUS
  Administered 2022-10-09: 2 [IU] via SUBCUTANEOUS
  Administered 2022-10-10 (×5): 1 [IU] via SUBCUTANEOUS
  Administered 2022-10-11: 3 [IU] via SUBCUTANEOUS
  Administered 2022-10-11 (×2): 2 [IU] via SUBCUTANEOUS
  Administered 2022-10-11: 1 [IU] via SUBCUTANEOUS
  Administered 2022-10-11: 2 [IU] via SUBCUTANEOUS
  Administered 2022-10-11: 1 [IU] via SUBCUTANEOUS
  Administered 2022-10-12 (×2): 3 [IU] via SUBCUTANEOUS
  Administered 2022-10-12 (×2): 2 [IU] via SUBCUTANEOUS
  Administered 2022-10-12: 3 [IU] via SUBCUTANEOUS
  Administered 2022-10-12 – 2022-10-13 (×3): 2 [IU] via SUBCUTANEOUS
  Administered 2022-10-13: 3 [IU] via SUBCUTANEOUS
  Administered 2022-10-13: 7 [IU] via SUBCUTANEOUS
  Administered 2022-10-13: 3 [IU] via SUBCUTANEOUS
  Administered 2022-10-13: 5 [IU] via SUBCUTANEOUS
  Administered 2022-10-14: 3 [IU] via SUBCUTANEOUS
  Administered 2022-10-14 (×2): 2 [IU] via SUBCUTANEOUS
  Administered 2022-10-14: 3 [IU] via SUBCUTANEOUS
  Administered 2022-10-15 – 2022-10-16 (×7): 2 [IU] via SUBCUTANEOUS
  Administered 2022-10-16: 1 [IU] via SUBCUTANEOUS
  Administered 2022-10-16: 2 [IU] via SUBCUTANEOUS
  Administered 2022-10-16: 1 [IU] via SUBCUTANEOUS

## 2022-10-02 MED ORDER — GLUCERNA 1.5 CAL PO LIQD
1000.0000 mL | ORAL | Status: DC
  Filled 2022-10-02 (×2): qty 1000

## 2022-10-02 MED ORDER — SODIUM CHLORIDE 0.9% IV SOLUTION: Freq: Once | INTRAVENOUS | Status: DC

## 2022-10-02 MED ORDER — FERROUS GLUCONATE 324 (38 FE) MG PO TABS
324.0000 mg | ORAL_TABLET | Freq: Every evening | ORAL | Status: DC
  Filled 2022-10-02 (×2): qty 1

## 2022-10-02 MED ORDER — POLYVINYL ALCOHOL 1.4 % OP SOLN: 1.0000 [drp] | OPHTHALMIC | Status: DC | PRN

## 2022-10-02 MED ORDER — SODIUM CHLORIDE 0.9 % IV BOLUS
2000.0000 mL | Freq: Once | INTRAVENOUS | Status: AC
  Administered 2022-10-02: 2000 mL via INTRAVENOUS

## 2022-10-02 MED ORDER — SODIUM CHLORIDE 0.9 % IV SOLN: INTRAVENOUS | Status: DC

## 2022-10-02 MED ORDER — VANCOMYCIN VARIABLE DOSE PER UNSTABLE RENAL FUNCTION (PHARMACIST DOSING): Status: DC

## 2022-10-02 MED ORDER — FREE WATER
200.0000 mL | Status: DC
  Administered 2022-10-02 – 2022-10-04 (×2): 200 mL

## 2022-10-02 MED ORDER — PYRIDOSTIGMINE BROMIDE 60 MG PO TABS
30.0000 mg | ORAL_TABLET | Freq: Three times a day (TID) | ORAL | Status: DC
  Filled 2022-10-02 (×3): qty 0.5

## 2022-10-02 MED ORDER — ROSUVASTATIN CALCIUM 10 MG PO TABS
5.0000 mg | ORAL_TABLET | Freq: Every day | ORAL | Status: DC
  Filled 2022-10-02: qty 1

## 2022-10-02 MED ORDER — CARVEDILOL 12.5 MG PO TABS
12.5000 mg | ORAL_TABLET | Freq: Two times a day (BID) | ORAL | Status: DC
  Filled 2022-10-02: qty 1

## 2022-10-02 MED ORDER — GLUCERNA 1.2 CAL PO LIQD
1000.0000 mL | ORAL | Status: DC
  Filled 2022-10-02: qty 1000

## 2022-10-02 MED ORDER — VANCOMYCIN HCL 1750 MG/350ML IV SOLN
1750.0000 mg | Freq: Once | INTRAVENOUS | Status: AC
  Administered 2022-10-02: 1750 mg via INTRAVENOUS
  Filled 2022-10-02: qty 350

## 2022-10-02 MED ORDER — SODIUM CHLORIDE 0.9% FLUSH
10.0000 mL | Freq: Two times a day (BID) | INTRAVENOUS | Status: DC
  Administered 2022-10-02 (×2): 10 mL
  Administered 2022-10-03: 15 mL
  Administered 2022-10-04 – 2022-10-16 (×23): 10 mL

## 2022-10-02 MED ORDER — PANTOPRAZOLE SODIUM 40 MG PO TBEC: 40.0000 mg | DELAYED_RELEASE_TABLET | Freq: Two times a day (BID) | ORAL | Status: DC

## 2022-10-02 MED ORDER — ENOXAPARIN SODIUM 30 MG/0.3ML IJ SOSY
30.0000 mg | PREFILLED_SYRINGE | INTRAMUSCULAR | Status: DC
  Administered 2022-10-02 – 2022-10-08 (×7): 30 mg via SUBCUTANEOUS
  Filled 2022-10-02 (×7): qty 0.3

## 2022-10-02 MED ORDER — SODIUM CHLORIDE 0.9 % IV BOLUS
1000.0000 mL | Freq: Once | INTRAVENOUS | Status: DC
Start: 2022-10-02 — End: 2022-10-02

## 2022-10-02 MED ORDER — ALTEPLASE 2 MG IJ SOLR
2.0000 mg | Freq: Once | INTRAMUSCULAR | Status: DC
  Filled 2022-10-02: qty 2

## 2022-10-02 MED ORDER — ACETAMINOPHEN 650 MG RE SUPP: 650.0000 mg | Freq: Four times a day (QID) | RECTAL | Status: DC | PRN

## 2022-10-02 MED ORDER — ORAL CARE MOUTH RINSE
15.0000 mL | OROMUCOSAL | Status: DC
  Administered 2022-10-02 – 2022-10-03 (×4): 15 mL via OROMUCOSAL

## 2022-10-02 MED ORDER — TAMSULOSIN HCL 0.4 MG PO CAPS
0.4000 mg | ORAL_CAPSULE | Freq: Every day | ORAL | Status: DC
  Administered 2022-10-04 – 2022-10-05 (×2): 0.4 mg via ORAL
  Filled 2022-10-02 (×2): qty 1

## 2022-10-02 MED ORDER — SODIUM CHLORIDE 0.9% FLUSH: 10.0000 mL | INTRAVENOUS | Status: DC | PRN

## 2022-10-02 MED ORDER — ONDANSETRON HCL 4 MG PO TABS: 4.0000 mg | ORAL_TABLET | Freq: Four times a day (QID) | ORAL | Status: DC | PRN

## 2022-10-02 MED ORDER — AMLODIPINE BESYLATE 10 MG PO TABS: 10.0000 mg | ORAL_TABLET | Freq: Every day | ORAL | Status: DC

## 2022-10-02 MED ORDER — INSULIN ASPART 100 UNIT/ML IJ SOLN
0.0000 [IU] | Freq: Four times a day (QID) | INTRAMUSCULAR | Status: DC
  Administered 2022-10-02: 2 [IU] via SUBCUTANEOUS

## 2022-10-02 MED ORDER — PIPERACILLIN-TAZOBACTAM 3.375 G IVPB
3.3750 g | Freq: Three times a day (TID) | INTRAVENOUS | Status: DC
  Administered 2022-10-02 – 2022-10-05 (×9): 3.375 g via INTRAVENOUS
  Filled 2022-10-02 (×9): qty 50

## 2022-10-02 MED ORDER — ENOXAPARIN SODIUM 40 MG/0.4ML IJ SOSY: 40.0000 mg | PREFILLED_SYRINGE | INTRAMUSCULAR | Status: DC

## 2022-10-02 MED ORDER — FOLIC ACID 1 MG PO TABS: 1.0000 mg | ORAL_TABLET | Freq: Every day | ORAL | Status: DC

## 2022-10-02 MED ORDER — ORAL CARE MOUTH RINSE: 15.0000 mL | OROMUCOSAL | Status: DC | PRN

## 2022-10-02 MED ORDER — CHLORHEXIDINE GLUCONATE CLOTH 2 % EX PADS
6.0000 | MEDICATED_PAD | Freq: Every day | CUTANEOUS | Status: DC
  Administered 2022-10-02 – 2022-10-03 (×3): 6 via TOPICAL

## 2022-10-02 MED ORDER — SERTRALINE HCL 50 MG PO TABS: 25.0000 mg | ORAL_TABLET | Freq: Every day | ORAL | Status: DC

## 2022-10-02 MED ORDER — INSULIN GLARGINE-YFGN 100 UNIT/ML ~~LOC~~ SOLN
8.0000 [IU] | Freq: Every day | SUBCUTANEOUS | Status: DC
  Administered 2022-10-02: 8 [IU] via SUBCUTANEOUS
  Filled 2022-10-02 (×2): qty 0.08

## 2022-10-02 MED ORDER — ONDANSETRON HCL 4 MG/2ML IJ SOLN: 4.0000 mg | Freq: Four times a day (QID) | INTRAMUSCULAR | Status: DC | PRN

## 2022-10-02 NOTE — Progress Notes (Signed)
[facility] admitting paged to make aware of pt arrival.  Physician paged

## 2022-10-02 NOTE — Progress Notes (Signed)
Pharmacy Antibiotic Note  Harry [name] is a 77 y.o. male admitted on 10/02/2022.  Pharmacy has been consulted for Vancomycin dosing.  Recent admission (7/4-7/15) with Granulicatella adiacens bacteremia with treatment plan for 6 weeks of IV vancomycin through 8/19.  Direct admit from [facility] on 7/29 due to fever, sepsis, renal failure, feeding tube redness/swelling/cellulitis, aspiration pneumonia.    Today, 10/02/2022: Vancomycin 1750mg  IV given on 7/30 at 08:22 PTA vancomycin records and labs not available at time of first dose. [facility] pharmacy reports he was on vanc 1500mg  IV q24h, last dose on 7/28 at 2pm WBC elevated at 17 AKI:  SCr 1.01 at discharge on 7/15, increased to 3.41 on 7/30   Plan: Zosyn 3.375g IV Q8H infused over 4hrs per MD.   No further vancomycin doses, will dose based on renal function/levels.   Measure Vanc levels as needed.  Goal AUC = 400 - 550. Follow up renal function, culture results, and clinical course.   Height: 5\' 9"  (175.3 cm) Weight: 85.9 kg (189 lb 6 oz) IBW/kg (Calculated) : 70.7  Temp (24hrs), Avg:99.2 F (37.3 C), Min:98.8 F (37.1 C), Max:99.6 F (37.6 C)  Recent Labs  Lab 10/02/22 1103  WBC 17.2*  CREATININE 3.41*    Estimated Creatinine Clearance: 20.3 mL/min (A) (by C-G formula based on SCr of 3.41 mg/dL (H)).    Allergies  Allergen Reactions   Lipitor [Atorvastatin] Other (See Comments)    Myalgias   Statins Other (See Comments)    Myalgias  Pt tolerating rosuvastatin 5mg  QD   Pravachol [Pravastatin] Rash    Antimicrobials this admission: PTA Vancomycin (7/4) >>  7/30 Zosyn >>   Dose adjustments this admission: 7/31 Vanc random level:  ____  Microbiology results: 7/30 MRSA PCR: not detected 7/30 CDiff:   Thank you for allowing pharmacy to be a part of this patient's care.  Lynann Beaver PharmD, BCPS [facility] main pharmacy [phone] 10/02/2022 7:24 AM

## 2022-10-02 NOTE — Plan of Care (Signed)
  Problem: Health Behavior/Discharge Planning: Goal: Ability to manage health-related needs will improve Outcome: Not Progressing   Problem: Clinical Measurements: Goal: Respiratory complications will improve Outcome: Not Progressing   Problem: Nutrition: Goal: Adequate nutrition will be maintained Outcome: Not Progressing   
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
## 2022-10-02 NOTE — Plan of Care (Signed)

## 2022-10-03 ENCOUNTER — Other Ambulatory Visit: Payer: Self-pay

## 2022-10-03 ENCOUNTER — Inpatient Hospital Stay (HOSPITAL_COMMUNITY): Payer: Medicare Other

## 2022-10-03 ENCOUNTER — Encounter (HOSPITAL_COMMUNITY): Payer: Medicare Other

## 2022-10-03 DIAGNOSIS — N179 Acute kidney failure, unspecified: Secondary | ICD-10-CM | POA: Diagnosis not present

## 2022-10-03 LAB — URINALYSIS, W/ REFLEX TO CULTURE (INFECTION SUSPECTED)
Bilirubin Urine: NEGATIVE
Glucose, UA: NEGATIVE mg/dL
Hgb urine dipstick: NEGATIVE
Ketones, ur: NEGATIVE mg/dL
Leukocytes,Ua: NEGATIVE
Nitrite: NEGATIVE
Protein, ur: NEGATIVE mg/dL
Specific Gravity, Urine: 1.024 (ref 1.005–1.030)
pH: 5 (ref 5.0–8.0)

## 2022-10-03 LAB — GLUCOSE, CAPILLARY
Glucose-Capillary: 102 mg/dL — ABNORMAL HIGH (ref 70–99)
Glucose-Capillary: 124 mg/dL — ABNORMAL HIGH (ref 70–99)
Glucose-Capillary: 131 mg/dL — ABNORMAL HIGH (ref 70–99)
Glucose-Capillary: 138 mg/dL — ABNORMAL HIGH (ref 70–99)
Glucose-Capillary: 147 mg/dL — ABNORMAL HIGH (ref 70–99)
Glucose-Capillary: 85 mg/dL (ref 70–99)
Glucose-Capillary: 96 mg/dL (ref 70–99)

## 2022-10-03 LAB — CULTURE, BLOOD (ROUTINE X 2)
Special Requests: ADEQUATE
Special Requests: ADEQUATE

## 2022-10-03 MED ORDER — SODIUM CHLORIDE 0.9 % IV SOLN: INTRAVENOUS | Status: DC

## 2022-10-03 MED ORDER — GLUCERNA 1.5 CAL PO LIQD
1000.0000 mL | ORAL | Status: DC
  Administered 2022-10-04: 1000 mL
  Filled 2022-10-03: qty 1000

## 2022-10-03 MED ORDER — ACETAMINOPHEN 325 MG PO TABS: 650.0000 mg | ORAL_TABLET | Freq: Four times a day (QID) | ORAL | Status: DC | PRN

## 2022-10-03 MED ORDER — SERTRALINE HCL 50 MG PO TABS
25.0000 mg | ORAL_TABLET | Freq: Every day | ORAL | Status: DC
  Administered 2022-10-04 – 2022-10-16 (×13): 25 mg
  Filled 2022-10-03 (×13): qty 1

## 2022-10-03 MED ORDER — ROSUVASTATIN CALCIUM 10 MG PO TABS
5.0000 mg | ORAL_TABLET | Freq: Every day | ORAL | Status: DC
  Administered 2022-10-04 – 2022-10-15 (×12): 5 mg
  Filled 2022-10-03 (×12): qty 1

## 2022-10-03 MED ORDER — PYRIDOSTIGMINE BROMIDE 60 MG PO TABS
30.0000 mg | ORAL_TABLET | Freq: Three times a day (TID) | ORAL | Status: DC
  Administered 2022-10-04 – 2022-10-16 (×37): 30 mg
  Filled 2022-10-03 (×42): qty 0.5

## 2022-10-03 MED ORDER — CARVEDILOL 12.5 MG PO TABS
12.5000 mg | ORAL_TABLET | Freq: Two times a day (BID) | ORAL | Status: DC
  Administered 2022-10-04 – 2022-10-16 (×25): 12.5 mg
  Filled 2022-10-03 (×25): qty 1

## 2022-10-03 MED ORDER — IOHEXOL 9 MG/ML PO SOLN
500.0000 mL | ORAL | Status: AC
  Administered 2022-10-03 (×2): 500 mL via ORAL

## 2022-10-03 MED ORDER — ACETAMINOPHEN 650 MG RE SUPP: 650.0000 mg | Freq: Four times a day (QID) | RECTAL | Status: DC | PRN

## 2022-10-03 MED ORDER — ONDANSETRON HCL 4 MG PO TABS: 4.0000 mg | ORAL_TABLET | Freq: Four times a day (QID) | ORAL | Status: DC | PRN

## 2022-10-03 MED ORDER — DEXTROSE-SODIUM CHLORIDE 5-0.45 % IV SOLN: INTRAVENOUS | Status: DC

## 2022-10-03 MED ORDER — ORAL CARE MOUTH RINSE: 15.0000 mL | OROMUCOSAL | Status: DC | PRN

## 2022-10-03 MED ORDER — ONDANSETRON HCL 4 MG/2ML IJ SOLN: 4.0000 mg | Freq: Four times a day (QID) | INTRAMUSCULAR | Status: DC | PRN

## 2022-10-03 NOTE — Progress Notes (Signed)
PROGRESS NOTE    Harry [name]  WJX:[id number] DOB: Jul 13, 1945 DOA: 10/02/2022 PCP: System, Provider Not In   Brief Narrative: Harry [name] is a 77 y.o. male with a history of bronchitis secondary to metapneumovirus, ischemic stroke, pyelonephritis, CKD stage IIIb, community-acquired pneumonia, hypertension, peripheral neuropathy, tobacco use, peripheral vascular disease, bacteremia currently on antibiotics.  Patient presented secondary to dehydration and associated AKI.  Concern for possible cellulitis at PEG site.  Patient's antibiotics were escalated to vancomycin and Zosyn.  Patient started on IV fluids for AKI.   Assessment and Plan:  AKI on CKD stage IIIb Recent baseline creatinine of 1. Creatinine on admission of 3.41. Presumed secondary to dehydration but is complicated by Vancomycin for bacteremia treatment. Patient started on IV fluids. Creatinine down to 3.30 this morning. -Daily BMP -Continue IV fluids -Follow-up CT abdomen/pelvis  Cellulitis Location is the upper abdomen at PEG site.  Presumed source of infection.  Patient with associated leukocytosis.  Complicated by history of bacteremia.  PEG tube recently placed.  Patient also also has a history of leaking from PEG site which may have caused local irritation rather than true cellulitis.  Zosyn added to treatment to more broadly cover.  Leukocytosis Presumed secondary to cellulitis, but patient also with concern for possible pneumonia on imaging.  Patient also appears to have difficulty with secretions which places aspiration at high risk for etiology.  Patient is on antibiotics for presumed cellulitis in addition to bacteremia.  History of granulicatella adiacens bacteremia Present on admission.  Patient is prescribed vancomycin IV with prior plan for treatment until 10/22/2022.  Vancomycin resumed on admission, renally dosed.  Daughter is adamant that patient has a line infection and requests removal of his  PICC. -Vancomycin per pharmacy -ID consult  Leaking PEG tube Unclear etiology. Recently placed at [facility] -CT abdomen/pelvis -Speech therapy consult  Acute urinary retention Unclear etiology. -Bladder scans and in and out catheterizations -If continued retention, will need Foley placement -Check urinalysis/culture  Acute on chronic anemia Baseline hemoglobin of about 8. Hemoglobin down to 6.9 on admission. Patient transfused 1 unit of PRBC. -FOBT  Hyperlipidemia -Continue Crestor once able  Primary hypertension -Continue Coreg once able  Diabetes mellitus type 2 -Continue sliding scale insulin -Discontinue Semglee secondary to n.p.o. status  Hemiplegia History of CVA Patient is on Plavix as an outpatient which is held secondary to anemia of unclear etiology  Harry body dementia Noted. Patient seen and evaluated by palliative care medicine at last hospitalization. Will defer consult at this time.  Pressure injury Right heel, mid sacrum. Present on admission.  Possible DVT Per daughter, patient was diagnosed with a LUE DVT at [facility], not on anticoagulation secondary to bleeding risk. Will need to obtain records from [facility].    DVT prophylaxis: Lovenox Code Status:   Code Status: Full Code Family Communication: Daughter via telephone Disposition Plan: Discharge pending clinical improvement and evaluation/management of PEG tube.   Consultants:  Infectious disease  Procedures: None  Antimicrobials: Vancomycin Zosyn   Subjective: Patient reports no issues from overnight. Per nursing, patient has been having some retention issues overnight. Patient required in/out catheterization.  Objective: BP (!) 137/56   Pulse 73   Temp 98.7 F (37.1 C) (Oral)   Resp (!) 21   Ht 5\' 9"  (1.753 m)   Wt 85.9 kg   SpO2 98%   BMI 27.97 kg/m   Examination:  General exam: Appears calm and comfortable Respiratory system: Rales respiratory effort normal. Cardiovascular  system: S1 & S2 heard, RRR. No murmurs, rubs, gallops or clicks. Gastrointestinal system: Abdomen is nondistended, soft and nontender. Normal bowel sounds heard. Central nervous system: Alert and oriented. No focal neurological deficits. Musculoskeletal: No edema. No calf tenderness Skin: No cyanosis. No rashes Psychiatry: Judgement and insight appear normal. Mood & affect appropriate.    Data Reviewed: I have personally reviewed following labs and imaging studies  CBC Lab Results  Component Value Date   WBC 20.1 (H) 10/03/2022   RBC 2.66 (L) 10/03/2022   HGB 7.6 (L) 10/03/2022   HCT 23.2 (L) 10/03/2022   MCV 87.2 10/03/2022   MCH 28.6 10/03/2022   PLT 335 10/03/2022   MCHC 32.8 10/03/2022   RDW 17.8 (H) 10/03/2022   LYMPHSABS 1.0 10/02/2022   MONOABS 2.0 (H) 10/02/2022   EOSABS 0.1 10/02/2022   BASOSABS 0.1 10/02/2022     Last metabolic panel Lab Results  Component Value Date   NA 139 10/03/2022   K 3.7 10/03/2022   CL 108 10/03/2022   CO2 17 (L) 10/03/2022   BUN 88 (H) 10/03/2022   CREATININE 3.30 (H) 10/03/2022   GLUCOSE 130 (H) 10/03/2022   GFRNONAA 19 (L) 10/03/2022   GFRAA 51 (L) 10/30/2019   CALCIUM 8.0 (L) 10/03/2022   PHOS 4.5 10/02/2022   PROT 5.9 (L) 10/03/2022   ALBUMIN 2.1 (L) 10/03/2022   LABGLOB 3.2 11/11/2015   AGRATIO 1.3 11/11/2015   BILITOT 1.0 10/03/2022   ALKPHOS 73 10/03/2022   AST 27 10/03/2022   ALT 33 10/03/2022   ANIONGAP 14 10/03/2022    GFR: Estimated Creatinine Clearance: 21 mL/min (A) (by C-G formula based on SCr of 3.3 mg/dL (H)).  Recent Results (from the past 240 hour(s))  MRSA Next Gen by PCR, Nasal     Status: None   Collection Time: 10/02/22  4:35 AM   Specimen: Nasal Mucosa; Nasal Swab  Result Value Ref Range Status   MRSA by PCR Next Gen NOT DETECTED NOT DETECTED Final    Comment: (NOTE) The GeneXpert MRSA Assay (FDA approved for NASAL specimens only), is one component of a comprehensive MRSA colonization  surveillance program. It is not intended to diagnose MRSA infection nor to guide or monitor treatment for MRSA infections. Test performance is not FDA approved in patients less than 59 years old. Performed at [facility], [address], [place], [place] [address]   C Difficile Quick Screen w PCR reflex     Status: None   Collection Time: 10/02/22 12:05 PM   Specimen: STOOL  Result Value Ref Range Status   C Diff antigen NEGATIVE NEGATIVE Final   C Diff toxin NEGATIVE NEGATIVE Final   C Diff interpretation No C. difficile detected.  Final    Comment: Performed at [facility], [address], [place], [place] [address]      Radiology Studies: DG CHEST PORT 1 VIEW  Result Date: 10/02/2022 CLINICAL DATA:  Evaluate central line placement EXAM: PORTABLE CHEST 1 VIEW COMPARISON:  09/09/2022 FINDINGS: Interval placement of left IJ catheter with tip in the projection of the distal SVC. Heart size and mediastinal contours are stable. Increased bilateral mid and lower lung zone opacities which may reflect bilateral pleural effusions, atelectasis or airspace disease. No pneumothorax identified. IMPRESSION: 1. Interval placement of left IJ catheter with tip in the projection of the distal SVC. No pneumothorax identified. 2. Increased bilateral mid and lower lung zone opacities which may reflect bilateral pleural effusions, atelectasis  and/or airspace disease. Electronically Signed   By: Signa Kell M.D.   On: 10/02/2022 05:15      LOS: 1 day    Harry Hawking, MD [facility] 10/03/2022, 7:20 AM   If 7PM-7AM, please contact night-coverage [email or web address]

## 2022-10-03 NOTE — Hospital Course (Addendum)
Harry [name] is a 77 y.o. male with a history of bronchitis secondary to metapneumovirus, ischemic stroke, pyelonephritis, CKD stage IIIb, community-acquired pneumonia, hypertension, peripheral neuropathy, tobacco use, peripheral vascular disease, bacteremia currently on antibiotics.  Patient presented secondary to dehydration and associated AKI.  Concern for possible cellulitis at PEG site.  Patient's antibiotics were escalated to vancomycin and Zosyn.  Patient started on IV fluids for AKI. Antibiotics adjusted to Linezolid and Unasyn.

## 2022-10-03 NOTE — Plan of Care (Signed)

## 2022-10-03 NOTE — Progress Notes (Signed)
Pharmacy Antibiotic Note  Harry [name] is a 77 y.o. male admitted on 10/02/2022.  Pharmacy has been consulted for Vancomycin dosing.  Recent admission (7/4-7/15) with Granulicatella adiacens bacteremia with treatment plan for 6 weeks of IV vancomycin through 8/19.  Direct admit from [facility] on 7/29-7/30 due to fever, sepsis, renal failure, feeding tube redness/swelling/cellulitis.  Today, 10/03/2022: WBC increased to 20 Afebrile SCr elevated but improved 3.4 > 3.3 (baseline SCr 1.01 on 7/15) Recent Vanc doses: 7/28 Vanc 1500mg  at 2pm.  7/30 Vanc 1750mg  at 8am  Plan: Zosyn 3.375g IV Q8H infused over 4hrs per MD.   No further vancomycin doses, will dose based on renal function/levels.   Measure Vanc levels as needed.  Goal AUC = 400 - 550. Follow up renal function, culture results, and clinical course.   Height: 5\' 9"  (175.3 cm) Weight: 85.9 kg (189 lb 6 oz) IBW/kg (Calculated) : 70.7  Temp (24hrs), Avg:98.4 F (36.9 C), Min:97.8 F (36.6 C), Max:98.8 F (37.1 C)  Recent Labs  Lab 10/02/22 1103 10/03/22 0359  WBC 17.2* 20.1*  CREATININE 3.41* 3.30*    Estimated Creatinine Clearance: 21 mL/min (A) (by C-G formula based on SCr of 3.3 mg/dL (H)).    Allergies  Allergen Reactions   Lipitor [Atorvastatin] Other (See Comments)    Myalgias   Statins Other (See Comments)    Myalgias  Pt tolerating rosuvastatin 5mg  QD   Pravachol [Pravastatin] Rash    Antimicrobials this admission: PTA Vancomycin (7/4) >>  7/30 Zosyn >>   Dose adjustments this admission: 7/31 Vanc random level:  41 (~ 20 hours after last dose) 8/1 Vanc random level:   Microbiology results: 7/30 MRSA PCR: not detected 7/30 CDiff: Ag neg, toxin neg  Thank you for allowing pharmacy to be a part of this patient's care.  Lynann Beaver PharmD, BCPS [facility] main pharmacy [phone] 10/03/2022 7:27 AM

## 2022-10-03 NOTE — Progress Notes (Signed)
  Daily Progress Note   Patient Name: Harry [name]       Date: 10/03/2022 DOB: 02-26-1946  Age: 77 y.o. MRN#: [id number] Attending Physician: Narda Bonds, MD Primary Care Physician: System, Provider Not In Admit Date: 10/02/2022 Length of Stay: 1 day  Discussed care with primary hospitalist today. New consult for PMT ordered overnight for GOC discussions. Patient last seen by PMT on 09/07/22. At that time daughter [name] was very clear she did not want PMT time involved in patient's medical care. Goals have previously been full code/full scope of care. Patient has MOST on file confirming that.  After discussions with hospitalist today, PMT consult will be canceled. Should family be willing to interact with PMT in the future, please reach out and re consult as we are happy to assist. Thank you.   Alvester Morin, DO Palliative Care Provider PMT # [phone]

## 2022-10-03 NOTE — Progress Notes (Signed)
Phone call placed at this time to daughter about the new order to start tube feedings for the pt tonight at a rate of 20cc/hr. Daughter states. "If you want my honest opinion then I think the peg tube should be removed because he moans in pain whenever you touch his abdomen". So at this time, tube feedings will be held for this night. Will continue to monitor.

## 2022-10-03 NOTE — Progress Notes (Addendum)
Initial Nutrition Assessment  INTERVENTION:   Once PEG deemed ready to use: -Initiate Jevity 1.5 @ 20 ml/hr, advance by 10 ml every 12 hours to goal rate of 60 ml/hr. -Provides 2160 kcals, 91g protein and 1094 ml H2O  -Multivitamin with minerals daily via tube  NUTRITION DIAGNOSIS:   Increased nutrient needs related to wound healing as evidenced by estimated needs.  GOAL:   Patient will meet greater than or equal to 90% of their needs  MONITOR:   Labs, Weight trends, I & O's, TF tolerance, Skin  REASON FOR ASSESSMENT:   Consult Enteral/tube feeding initiation and management  ASSESSMENT:   77 y.o. male with medical history significant of bronchitis due to human metapneumovirus, history ischemic stroke, history of metabolic encephalopathy, pyelonephritis, AKI, community-acquired pneumonia, stage III CKD, hyperlipidemia, hypertension, microalbuminuria, peripheral neuropathy, history of pneumonia, history of sepsis, tobacco abuse, peripheral vascular disease, left eye vision loss who was recently discharged on 09/17/2022 to [facility] after hospitalization for AKI and bacteremia due to [facility] ADIACENS currently on vancomycin until 10/22/2022 per ID recommendations.  While he was at [facility], they placed a PEG.  Unfortunately, the PEG site seems to have developed cellulitis and the patient has become dehydrated and again developed acute kidney injury.  Patient in room, no family at bedside. Disoriented, unable to provide history.  Per discussion with RN, pt's PEG is with swelling and erythema. Plan is for CT scan to evaluate. Tube feeding was never started.  Glucerna 1.5 @ 30 ml/hr was ordered. Unable to determine regimen pt was started on at [facility].  Once tube is ready to use, will start Jevity 1.5 and advance slowly towards goal. Pt with history of diabetes and will assess blood sugars once tube feeding is started to see if Glucerna is warranted.  Per weight records, no  weight loss noted. Recommend daily weights once tube feeding is started.  Medications: Fergon, D5 infusion  Labs reviewed: CBGs: 85-186   NUTRITION - FOCUSED PHYSICAL EXAM:  Flowsheet Row Most Recent Value  Orbital Region No depletion  Upper Arm Region No depletion  Thoracic and Lumbar Region No depletion  Buccal Region No depletion  Temple Region No depletion  Clavicle Bone Region No depletion  Clavicle and Acromion Bone Region No depletion  Scapular Bone Region No depletion  Dorsal Hand No depletion  Patellar Region No depletion  Anterior Thigh Region No depletion  Posterior Calf Region Unable to assess  [boots on]  Edema (RD Assessment) None  Hair Reviewed  Eyes Unable to assess  Mouth Unable to assess  Skin Reviewed  Nails Reviewed       Diet Order:   Diet Order             Diet NPO time specified  Diet effective now                   EDUCATION NEEDS:   Not appropriate for education at this time  Skin:  Skin Assessment: Skin Integrity Issues: Skin Integrity Issues:: DTI DTI: bilateral heels, stage 2 sacrum  Last BM:  7/31 -type 6  Height:   Ht Readings from Last 1 Encounters:  10/02/22 5\' 9"  (1.753 m)    Weight:   Wt Readings from Last 1 Encounters:  10/02/22 85.9 kg    BMI:  Body mass index is 27.97 kg/m.  Estimated Nutritional Needs:   Kcal:  2100-2300  Protein:  105-115g  Fluid:  2.1L/day  Tilda Franco, MS, RD, LDN Inpatient Clinical  Dietitian Contact information available via Amion

## 2022-10-04 ENCOUNTER — Inpatient Hospital Stay (HOSPITAL_COMMUNITY): Payer: Medicare Other

## 2022-10-04 ENCOUNTER — Encounter (HOSPITAL_COMMUNITY): Payer: Self-pay | Admitting: Internal Medicine

## 2022-10-04 DIAGNOSIS — M7989 Other specified soft tissue disorders: Secondary | ICD-10-CM | POA: Diagnosis not present

## 2022-10-04 DIAGNOSIS — N179 Acute kidney failure, unspecified: Secondary | ICD-10-CM | POA: Diagnosis not present

## 2022-10-04 LAB — URINALYSIS, COMPLETE (UACMP) WITH MICROSCOPIC
Bilirubin Urine: NEGATIVE
Glucose, UA: NEGATIVE mg/dL
Ketones, ur: NEGATIVE mg/dL
Nitrite: NEGATIVE
Protein, ur: 100 mg/dL — AB
Specific Gravity, Urine: 1.025 (ref 1.005–1.030)
pH: 5 (ref 5.0–8.0)

## 2022-10-04 LAB — IRON AND TIBC
Iron: 21 ug/dL — ABNORMAL LOW (ref 45–182)
Saturation Ratios: 12 % — ABNORMAL LOW (ref 17.9–39.5)
TIBC: 176 ug/dL — ABNORMAL LOW (ref 250–450)
UIBC: 155 ug/dL

## 2022-10-04 LAB — GLUCOSE, CAPILLARY
Glucose-Capillary: 142 mg/dL — ABNORMAL HIGH (ref 70–99)
Glucose-Capillary: 152 mg/dL — ABNORMAL HIGH (ref 70–99)
Glucose-Capillary: 165 mg/dL — ABNORMAL HIGH (ref 70–99)
Glucose-Capillary: 170 mg/dL — ABNORMAL HIGH (ref 70–99)
Glucose-Capillary: 179 mg/dL — ABNORMAL HIGH (ref 70–99)
Glucose-Capillary: 189 mg/dL — ABNORMAL HIGH (ref 70–99)

## 2022-10-04 LAB — RETICULOCYTES
Immature Retic Fract: 18.7 % — ABNORMAL HIGH (ref 2.3–15.9)
RBC.: 2.49 MIL/uL — ABNORMAL LOW (ref 4.22–5.81)
Retic Count, Absolute: 25.6 10*3/uL (ref 19.0–186.0)
Retic Ct Pct: 1 % (ref 0.4–3.1)

## 2022-10-04 LAB — FERRITIN: Ferritin: 126 ng/mL (ref 24–336)

## 2022-10-04 LAB — LACTATE DEHYDROGENASE: LDH: 138 U/L (ref 98–192)

## 2022-10-04 MED ORDER — ALBUTEROL SULFATE (2.5 MG/3ML) 0.083% IN NEBU
2.5000 mg | INHALATION_SOLUTION | RESPIRATORY_TRACT | Status: DC | PRN
  Administered 2022-10-04 (×4): 2.5 mg via RESPIRATORY_TRACT
  Filled 2022-10-04 (×4): qty 3

## 2022-10-04 MED ORDER — JEVITY 1.5 CAL/FIBER PO LIQD
1000.0000 mL | ORAL | Status: DC
  Administered 2022-10-04 – 2022-10-16 (×10): 1000 mL
  Filled 2022-10-04 (×20): qty 1000

## 2022-10-04 MED ORDER — ALBUTEROL SULFATE (2.5 MG/3ML) 0.083% IN NEBU
2.5000 mg | INHALATION_SOLUTION | Freq: Four times a day (QID) | RESPIRATORY_TRACT | Status: DC | PRN
  Administered 2022-10-05 – 2022-10-06 (×2): 2.5 mg via RESPIRATORY_TRACT
  Filled 2022-10-04 (×2): qty 3

## 2022-10-04 MED ORDER — FUROSEMIDE 10 MG/ML IJ SOLN
60.0000 mg | Freq: Two times a day (BID) | INTRAMUSCULAR | Status: DC
  Administered 2022-10-05 – 2022-10-07 (×5): 60 mg via INTRAVENOUS
  Filled 2022-10-04 (×5): qty 6

## 2022-10-04 MED ORDER — FERROUS SULFATE 300 (60 FE) MG/5ML PO SOLN
220.0000 mg | Freq: Every day | ORAL | Status: DC
  Administered 2022-10-04 – 2022-10-16 (×13): 220 mg
  Filled 2022-10-04 (×14): qty 5

## 2022-10-04 MED ORDER — ADULT MULTIVITAMIN LIQUID CH
15.0000 mL | Freq: Every day | ORAL | Status: DC
  Administered 2022-10-04 – 2022-10-16 (×13): 15 mL
  Filled 2022-10-04 (×13): qty 15

## 2022-10-04 MED ORDER — ALBUMIN HUMAN 25 % IV SOLN
25.0000 g | Freq: Once | INTRAVENOUS | Status: AC
  Administered 2022-10-04: 25 g via INTRAVENOUS
  Filled 2022-10-04: qty 100

## 2022-10-04 MED ORDER — FUROSEMIDE 10 MG/ML IJ SOLN
40.0000 mg | Freq: Once | INTRAMUSCULAR | Status: AC
  Administered 2022-10-04: 40 mg via INTRAVENOUS
  Filled 2022-10-04: qty 4

## 2022-10-04 MED ORDER — ADULT MULTIVITAMIN W/MINERALS CH: 1.0000 | ORAL_TABLET | Freq: Every day | ORAL | Status: DC

## 2022-10-04 MED ORDER — POTASSIUM CHLORIDE 20 MEQ PO PACK
40.0000 meq | PACK | Freq: Once | ORAL | Status: AC
  Administered 2022-10-04: 40 meq
  Filled 2022-10-04: qty 2

## 2022-10-04 MED ORDER — IPRATROPIUM-ALBUTEROL 0.5-2.5 (3) MG/3ML IN SOLN
3.0000 mL | Freq: Three times a day (TID) | RESPIRATORY_TRACT | Status: DC
  Filled 2022-10-04: qty 3

## 2022-10-04 NOTE — Progress Notes (Signed)
PROGRESS NOTE    Harry [name]  ZOX:[id number] DOB: [birth year] DOA: 10/02/2022 PCP: System, Provider Not In   Brief Narrative: Harry [name] is a 77 y.o. male with a history of bronchitis secondary to metapneumovirus, ischemic stroke, pyelonephritis, CKD stage IIIb, community-acquired pneumonia, hypertension, peripheral neuropathy, tobacco use, peripheral vascular disease, bacteremia currently on antibiotics.  Patient presented secondary to dehydration and associated AKI.  Concern for possible cellulitis at PEG site.  Patient's antibiotics were escalated to vancomycin and Zosyn.  Patient started on IV fluids for AKI.   Assessment and Plan:  AKI on CKD stage IIIb Recent baseline creatinine of 1. Creatinine on admission of 3.41. Presumed secondary to dehydration but is complicated by Vancomycin for bacteremia treatment. Patient started on IV fluids. Creatinine stable at 3.44 this morning. Patient with associated anasarca, likely needing Lasix. -Daily BMP -Continue IV fluids -Nephrology consult  Cellulitis Location is the upper abdomen at PEG site.  Presumed source of infection.  Patient with associated leukocytosis.  Complicated by history of bacteremia.  PEG tube recently placed.  Patient also also has a history of leaking from PEG site which may have caused local irritation rather than true cellulitis.  Zosyn added to treatment to more broadly cover.  Leukocytosis Presumed secondary to cellulitis, but patient also with concern for possible pneumonia on imaging.  Patient also appears to have difficulty with secretions which places aspiration at high risk for etiology.  Patient is on antibiotics for presumed cellulitis in addition to bacteremia. -Continue Zosyn for possible aspiration pneumonia  History of granulicatella adiacens bacteremia Present on admission.  Patient is prescribed vancomycin IV with prior plan for treatment until 10/22/2022.  Vancomycin resumed on admission,  renally dosed.  Daughter is adamant that patient has a line infection and requests removal of his PICC. -Vancomycin per pharmacy -ID consulted; recommendations pending  Leaking PEG tube Recently placed at [facility]. Unsure if patient is safe for oral intake which may have been initial indication for placement. CT abdomen/pelvis with proper placement seen. -Speech therapy recommendations: NPO except meds crushed with puree (if not using PEG); moderate aspiration risk -Resume tube feeds at slow rate and titrate upward  Acute urinary retention Unclear etiology. Foley catheter placed on 7/31. Urinalysis unremarkable. -Continue foley catheter  Bilateral pleural effusions Moderate in size per CT read. In setting of anasarca. -Will watch respiratory status carefully  Anasarca Noted on CT scan with patient showing clinical evidence of generalized edema. Likely related to overall nutrition status. -Was considering adding Lasix +/- albumin while on IV fluids, however daughter was adamant about nephrology involvement/direction regarding kidney function and concern that Lasix would worsen kidney function.  Acute on chronic anemia Baseline hemoglobin of about 8. Hemoglobin down to 6.9 on admission. Patient transfused 1 unit of PRBC. Bilirubin normal. INR elevated at 1.4. -FOBT ordered and pending -Check LDH -Transfuse for hemoglobin <7  Hyperlipidemia -Continue Crestor once able  Primary hypertension -Continue Coreg once able  Diabetes mellitus type 2 Semglee discontinued secondary to down-trending blood sugar and current lack of nutrition intake. -Continue sliding scale insulin  Hypokalemia -Potassium supplementation per tube  Hemiplegia History of CVA Patient is on Plavix as an outpatient which is held secondary to anemia of unclear etiology  Lewy body dementia Noted. Patient seen and evaluated by palliative care medicine at last hospitalization. Will defer consult at this  time.  Pressure injury Right heel, mid sacrum. Present on admission.  Possible DVT Per daughter, patient was diagnosed with a RUE  DVT at [facility], not on anticoagulation secondary to bleeding risk. Will need to obtain records from [facility]. -Obtain left upper extremity venous duplex for LUE swelling    DVT prophylaxis: Lovenox Code Status:   Code Status: Full Code Family Communication: Daughter via telephone Disposition Plan: Discharge pending improvement of AKI, nephrology/ID recommendations   Consultants:  Infectious disease  Procedures: None  Antimicrobials: Vancomycin Zosyn   Subjective: Patient reports no concerns this morning.  Objective: BP (!) 134/59   Pulse 76   Temp 98.2 F (36.8 C) (Oral)   Resp (!) 24   Ht 5\' 9"  (1.753 m)   Wt 89.2 kg   SpO2 98%   BMI 29.04 kg/m   Examination:  General exam: Appears calm and comfortable Respiratory system: Diffuse rhonchi. Respiratory effort normal. Cardiovascular system: S1 & S2 heard, RRR. Gastrointestinal system: Abdomen is nondistended, soft and nontender. Normal bowel sounds heard. Central nervous system: Alert and oriented to self. Musculoskeletal: Generalized edema   Data Reviewed: I have personally reviewed following labs and imaging studies  CBC Lab Results  Component Value Date   WBC 16.5 (H) 10/04/2022   RBC 2.48 (L) 10/04/2022   HGB 7.0 (L) 10/04/2022   HCT 21.6 (L) 10/04/2022   MCV 87.1 10/04/2022   MCH 28.2 10/04/2022   PLT 333 10/04/2022   MCHC 32.4 10/04/2022   RDW 17.7 (H) 10/04/2022   LYMPHSABS 1.0 10/02/2022   MONOABS 2.0 (H) 10/02/2022   EOSABS 0.1 10/02/2022   BASOSABS 0.1 10/02/2022     Last metabolic panel Lab Results  Component Value Date   NA 138 10/04/2022   K 3.3 (L) 10/04/2022   CL 108 10/04/2022   CO2 17 (L) 10/04/2022   BUN 92 (H) 10/04/2022   CREATININE 3.44 (H) 10/04/2022   GLUCOSE 170 (H) 10/04/2022   GFRNONAA 18 (L) 10/04/2022   GFRAA 51 (L) 10/30/2019    CALCIUM 7.7 (L) 10/04/2022   PHOS 4.5 10/02/2022   PROT 5.6 (L) 10/04/2022   ALBUMIN 2.0 (L) 10/04/2022   LABGLOB 3.2 11/11/2015   AGRATIO 1.3 11/11/2015   BILITOT 0.9 10/04/2022   ALKPHOS 85 10/04/2022   AST 21 10/04/2022   ALT 28 10/04/2022   ANIONGAP 13 10/04/2022    GFR: Estimated Creatinine Clearance: 20.5 mL/min (A) (by C-G formula based on SCr of 3.44 mg/dL (H)).  Recent Results (from the past 240 hour(s))  MRSA Next Gen by PCR, Nasal     Status: None   Collection Time: 10/02/22  4:35 AM   Specimen: Nasal Mucosa; Nasal Swab  Result Value Ref Range Status   MRSA by PCR Next Gen NOT DETECTED NOT DETECTED Final    Comment: (NOTE) The GeneXpert MRSA Assay (FDA approved for NASAL specimens only), is one component of a comprehensive MRSA colonization surveillance program. It is not intended to diagnose MRSA infection nor to guide or monitor treatment for MRSA infections. Test performance is not FDA approved in patients less than 80 years old. Performed at [facility], [address], [place], [place] [address]   C Difficile Quick Screen w PCR reflex     Status: None   Collection Time: 10/02/22 12:05 PM   Specimen: STOOL  Result Value Ref Range Status   C Diff antigen NEGATIVE NEGATIVE Final   C Diff toxin NEGATIVE NEGATIVE Final   C Diff interpretation No C. difficile detected.  Final    Comment: Performed at [facility], [address] [place], [place] [address]  Culture, blood (Routine X 2) w Reflex to ID Panel     Status: None (Preliminary result)   Collection Time: 10/03/22  4:04 PM   Specimen: BLOOD RIGHT HAND  Result Value Ref Range Status   Specimen Description   Final    BLOOD RIGHT HAND Performed at [facility], [address] [place], [place] [address]    Special Requests   Final    BOTTLES DRAWN AEROBIC AND ANAEROBIC Blood Culture adequate volume Performed at [facility], [address], [place], [place] [address]    Culture   Final    NO GROWTH < 12 HOURS Performed at [facility], [address] [place], [place] [address]    Report Status PENDING  Incomplete  Culture, blood (Routine X 2) w Reflex to ID Panel     Status: None (Preliminary result)   Collection Time: 10/03/22  4:09 PM   Specimen: BLOOD RIGHT HAND  Result Value Ref Range Status   Specimen Description   Final    BLOOD RIGHT HAND Performed at [facility], [address], [place], [place] [address]    Special Requests   Final    BOTTLES DRAWN AEROBIC AND ANAEROBIC Blood Culture adequate volume Performed at [facility], [address], [place], [place] [address]    Culture   Final    NO GROWTH < 12 HOURS Performed at [facility], [address], [place], [place] [address]    Report Status PENDING  Incomplete      Radiology Studies: CT ABDOMEN PELVIS WO CONTRAST  Result Date: 10/03/2022 CLINICAL DATA:  Leaking PEG tube, cellulitis EXAM: CT ABDOMEN AND PELVIS WITHOUT CONTRAST TECHNIQUE: Multidetector CT imaging of the abdomen and pelvis was performed following the standard protocol without IV contrast. RADIATION DOSE REDUCTION: This exam was performed according to the departmental dose-optimization program which includes automated exposure control, adjustment of the mA and/or kV according to patient size and/or use of iterative reconstruction technique. COMPARISON:  01/07/2023 FINDINGS: Lower chest: There are moderate bilateral pleural effusions with compressive bilateral lower lobe atelectasis. Hepatobiliary: Stable cholelithiasis without evidence of acute cholecystitis. Unremarkable unenhanced appearance of the liver. Pancreas: Unremarkable unenhanced appearance. Spleen: Unremarkable unenhanced appearance. Adrenals/Urinary Tract: No urinary tract calculi or obstructive uropathy within either kidney. Vascular calcifications are seen at the renal hila. The adrenals are  unremarkable. Bladder is minimally distended, with nonspecific bladder wall thickening. Stomach/Bowel: No bowel obstruction or ileus. Normal appendix right lower quadrant. No bowel wall thickening or inflammatory change. There is a PEG tube, with the balloon inflated within the gastric lumen. There is no evidence of oral contrast extravasation surrounding the percutaneous gastrostomy tube. There is fat stranding and rectus sheath edema surrounding the peg tube within the left anterior abdominal wall, which may be related to recent placement. Vascular/Lymphatic: Aortic atherosclerosis. No enlarged abdominal or pelvic lymph nodes. Reproductive: Stable enlarged prostate. Other: Trace ascites. No free intraperitoneal gas. No abdominal wall hernia. Musculoskeletal: Diffuse body wall edema. No acute or destructive bony abnormalities. Reconstructed images demonstrate no additional findings. IMPRESSION: 1. Peg tube within the gastric lumen. Subcutaneous fat stranding and intramuscular edema surround the PEG tube site within the left anterior abdominal wall, which could be due to recent placement or soft tissue inflammation/infection. There is no evidence of oral contrast extravasation. 2. Moderate bilateral pleural effusions with compressive bilateral lower lobe atelectasis. 3. Trace ascites. 4. Diffuse body wall edema. 5. Cholelithiasis  without cholecystitis. 6. Stable enlarged prostate. Nonspecific bladder wall thickening could be due to chronic bladder outlet obstruction or under distension. 7.  Aortic Atherosclerosis (ICD10-I70.0). Electronically Signed   By: Sharlet Salina M.D.   On: 10/03/2022 18:01      LOS: 2 days    Jacquelin Hawking, MD [facility] 10/04/2022, 8:06 AM   If 7PM-7AM, please contact night-coverage [email or web address]

## 2022-10-04 NOTE — Progress Notes (Signed)
Pharmacy Antibiotic Note  FAIRLEY GOTAY is a 77 y.o. male admitted on 10/02/2022.  Pharmacy has been consulted for Vancomycin dosing.  Recent admission (7/4-7/15) with Granulicatella adiacens bacteremia with treatment plan for 6 weeks of IV vancomycin through 8/19.  Direct admit from [facility] on 7/29-7/30 due to fever, sepsis, renal failure, feeding tube redness/swelling/cellulitis.  Today, 10/04/2022: WBC decreased to 16 Afebrile SCr remains elevated at 3.4 (baseline SCr 1.01 on 7/15) Recent Vanc doses: 7/28 Vanc 1500mg  at 2pm.  7/30 Vanc 1750mg  at 8am  Plan: Zosyn 3.375g IV Q8H infused over 4hrs per MD.   No further vancomycin doses, will dose based on renal function/levels.   Measure Vanc levels as needed.  Goal AUC = 400 - 550. Follow up renal function, culture results, and clinical course.   Height: 5\' 9"  (175.3 cm) Weight: 89.2 kg (196 lb 10.4 oz) IBW/kg (Calculated) : 70.7  Temp (24hrs), Avg:98.2 F (36.8 C), Min:97.8 F (36.6 C), Max:98.5 F (36.9 C)  Recent Labs  Lab 10/02/22 1103 10/03/22 0359 10/04/22 0531  WBC 17.2* 20.1* 16.5*  CREATININE 3.41* 3.30* 3.44*  VANCORANDOM  --  41 35    Estimated Creatinine Clearance: 20.5 mL/min (A) (by C-G formula based on SCr of 3.44 mg/dL (H)).    Allergies  Allergen Reactions   Lipitor [Atorvastatin] Other (See Comments)    Myalgias   Statins Other (See Comments)    Myalgias  Pt tolerating rosuvastatin 5mg  QD   Pravachol [Pravastatin] Rash    Antimicrobials this admission: PTA Vancomycin (7/4) >>  7/30 Zosyn >>   Dose adjustments this admission: 7/31 Vanc random level:  41 (~ 20 hours after last dose) 8/1 Vanc random level: 35 8/2 Vanc random level:   Microbiology results: 7/30 MRSA PCR: not detected 7/30 CDiff: Ag neg, toxin neg 7/31 BCx:   Thank you for allowing pharmacy to be a part of this patient's care.  Lynann Beaver PharmD, BCPS [facility] main pharmacy [phone] 10/04/2022 6:52 AM

## 2022-10-04 NOTE — Progress Notes (Signed)
Crushed patient's medications and gave in applesauce per SLP recommendations, patient tolerated well. Will continue to monitor and follow SLP and MD recommendations.
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
## 2022-10-04 NOTE — Plan of Care (Signed)
Discussed with patient in front of daughter plan of care for the evening, pain management and mouth care with some teach back displayed.  What is important to the patient tonight is rest and changing the dressing on his PEG tube by morning.    Problem: Education: Goal: Knowledge of General Education information will improve Description: Including pain rating scale, medication(s)/side effects and non-pharmacologic comfort measures Outcome: Progressing   Problem: Health Behavior/Discharge Planning: Goal: Ability to manage health-related needs will improve Outcome: Progressing   Problem: Pain Managment: Goal: General experience of comfort will improve Outcome: Progressing

## 2022-10-04 NOTE — Progress Notes (Signed)
Nutrition Follow-up  INTERVENTION:   -Switch to Jevity 1.5 @ 20 ml/hr, advance by 10 ml every 12 hours to goal rate of 60 ml/hr.  -Goal rate provides 2160 kcals, 91g protein and 1094 ml H2O  -Free water flushes: 200 ml every 4 hours  -Multivitamin with minerals daily via tube  -Monitor blood sugars given formula change  NUTRITION DIAGNOSIS:   Increased nutrient needs related to wound healing as evidenced by estimated needs.  Ongoing.  GOAL:   Patient will meet greater than or equal to 90% of their needs  Not met yet, progressing with TF  MONITOR:   Labs, Weight trends, I & O's, TF tolerance, Skin  ASSESSMENT:   77 y.o. male with medical history significant of bronchitis due to human metapneumovirus, history ischemic stroke, history of metabolic encephalopathy, pyelonephritis, AKI, community-acquired pneumonia, stage III CKD, hyperlipidemia, hypertension, microalbuminuria, peripheral neuropathy, history of pneumonia, history of sepsis, tobacco abuse, peripheral vascular disease, left eye vision loss who was recently discharged on 09/17/2022 to [facility] after hospitalization for AKI and bacteremia due to [facility] ADIACENS currently on vancomycin until 10/22/2022 per ID recommendations.  While he was at [facility], they placed a PEG.  Unfortunately, the PEG site seems to have developed cellulitis and the patient has become dehydrated and again developed acute kidney injury.  Glucerna 1.5 ordered per MD to start at 20 ml/hr today and daughter agreed with restart. Per chat with MD, can switch to Jevity 1.5 and assess blood sugars. Started tube feeding protocol as well.  Plan relayed to RNs. RN reports tube looks better today.   Admission weight: 189 lbs Current weight: 196 lbs  Medications: Fergon,  D5 infusion  Labs reviewed: CBGs: 131-170 Low K  Diet Order:   Diet Order             Diet NPO time specified  Diet effective now                   EDUCATION  NEEDS:   Not appropriate for education at this time  Skin:  Skin Assessment: Skin Integrity Issues: Skin Integrity Issues:: DTI DTI: bilateral heels, stage 2 sacrum  Last BM:  8/1 -type 5  Height:   Ht Readings from Last 1 Encounters:  10/02/22 5\' 9"  (1.753 m)    Weight:   Wt Readings from Last 1 Encounters:  10/04/22 89.2 kg    BMI:  Body mass index is 29.04 kg/m.  Estimated Nutritional Needs:   Kcal:  2100-2300  Protein:  105-115g  Fluid:  2.1L/day   Tilda Franco, MS, RD, LDN Inpatient Clinical Dietitian Contact information available via Amion
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
## 2022-10-04 NOTE — Progress Notes (Signed)
Left upper extremity venous duplex has been completed. Preliminary results can be found in CV Proc through chart review.   10/04/22 11:36 AM Olen Cordial RVT

## 2022-10-05 ENCOUNTER — Inpatient Hospital Stay (HOSPITAL_COMMUNITY): Payer: Medicare Other

## 2022-10-05 DIAGNOSIS — K9422 Gastrostomy infection: Secondary | ICD-10-CM | POA: Diagnosis not present

## 2022-10-05 DIAGNOSIS — I10 Essential (primary) hypertension: Secondary | ICD-10-CM

## 2022-10-05 DIAGNOSIS — N1832 Chronic kidney disease, stage 3b: Secondary | ICD-10-CM | POA: Diagnosis not present

## 2022-10-05 DIAGNOSIS — L03311 Cellulitis of abdominal wall: Secondary | ICD-10-CM

## 2022-10-05 DIAGNOSIS — F0280 Dementia in other diseases classified elsewhere without behavioral disturbance: Secondary | ICD-10-CM

## 2022-10-05 DIAGNOSIS — A419 Sepsis, unspecified organism: Secondary | ICD-10-CM

## 2022-10-05 DIAGNOSIS — N179 Acute kidney failure, unspecified: Secondary | ICD-10-CM | POA: Diagnosis not present

## 2022-10-05 DIAGNOSIS — G3183 Dementia with Lewy bodies: Secondary | ICD-10-CM

## 2022-10-05 LAB — GLUCOSE, CAPILLARY
Glucose-Capillary: 160 mg/dL — ABNORMAL HIGH (ref 70–99)
Glucose-Capillary: 163 mg/dL — ABNORMAL HIGH (ref 70–99)
Glucose-Capillary: 170 mg/dL — ABNORMAL HIGH (ref 70–99)
Glucose-Capillary: 188 mg/dL — ABNORMAL HIGH (ref 70–99)
Glucose-Capillary: 196 mg/dL — ABNORMAL HIGH (ref 70–99)
Glucose-Capillary: 200 mg/dL — ABNORMAL HIGH (ref 70–99)
Glucose-Capillary: 201 mg/dL — ABNORMAL HIGH (ref 70–99)

## 2022-10-05 LAB — BLOOD GAS, ARTERIAL
Acid-base deficit: 8.7 mmol/L — ABNORMAL HIGH (ref 0.0–2.0)
Bicarbonate: 16.2 mmol/L — ABNORMAL LOW (ref 20.0–28.0)
Drawn by: 213381
FIO2: 21 %
O2 Saturation: 98 %
Patient temperature: 36.2
pCO2 arterial: 29 mmHg — ABNORMAL LOW (ref 32–48)
pH, Arterial: 7.35 (ref 7.35–7.45)
pO2, Arterial: 72 mmHg — ABNORMAL LOW (ref 83–108)

## 2022-10-05 LAB — OCCULT BLOOD X 1 CARD TO LAB, STOOL: Fecal Occult Bld: NEGATIVE

## 2022-10-05 LAB — URINALYSIS, ROUTINE W REFLEX MICROSCOPIC
Bilirubin Urine: NEGATIVE
Glucose, UA: NEGATIVE mg/dL
Ketones, ur: NEGATIVE mg/dL
Nitrite: NEGATIVE
Protein, ur: 30 mg/dL — AB
RBC / HPF: >50 RBC/hpf (ref 0–5)
Specific Gravity, Urine: 1.018 (ref 1.005–1.030)
pH: 5 (ref 5.0–8.0)

## 2022-10-05 MED ORDER — CHLORHEXIDINE GLUCONATE CLOTH 2 % EX PADS
6.0000 | MEDICATED_PAD | Freq: Every day | CUTANEOUS | Status: DC
  Administered 2022-10-05 – 2022-10-07 (×4): 6 via TOPICAL

## 2022-10-05 MED ORDER — IPRATROPIUM-ALBUTEROL 0.5-2.5 (3) MG/3ML IN SOLN
3.0000 mL | Freq: Three times a day (TID) | RESPIRATORY_TRACT | Status: DC
  Administered 2022-10-05 – 2022-10-10 (×16): 3 mL via RESPIRATORY_TRACT
  Filled 2022-10-05 (×15): qty 3

## 2022-10-05 MED ORDER — PIPERACILLIN-TAZOBACTAM 3.375 G IVPB: 3.3750 g | Freq: Two times a day (BID) | INTRAVENOUS | Status: DC

## 2022-10-05 MED ORDER — SODIUM CHLORIDE 0.9 % IV SOLN
3.0000 g | Freq: Two times a day (BID) | INTRAVENOUS | Status: DC
  Administered 2022-10-05 – 2022-10-07 (×5): 3 g via INTRAVENOUS
  Filled 2022-10-05 (×6): qty 8

## 2022-10-05 NOTE — Evaluation (Signed)
Physical Therapy Evaluation Patient Details Name: Harry [name] MRN: [id number] DOB: 24-Aug-1945 Today's Date: 10/05/2022  History of Present Illness  Harry [name] is a 77 y.o. male with a history of bronchitis secondary to metapneumovirus, ischemic stroke, pyelonephritis, CKD stage IIIb, community-acquired pneumonia, hypertension, peripheral neuropathy, tobacco use, peripheral vascular disease, bacteremia currently on antibiotics.  Patient presented secondary to dehydration and associated AKI.  Concern for possible cellulitis at PEG site.  Clinical Impression  The patient  was nonresponsive, grunting, right eye rolled up. Patient resistive/rigid in all extremities with attempts for ROM, only saw  patient rotate head to the left, no volitional limb movements noted. Information from chart indicates that  the patient has been in LTC at [place] then to [facility] 3 weeks ago.  Currently, patient is unable to participate and does not have potential for functional recovery.   PT will sign off.      If plan is discharge home, recommend the following: A lot of help with bathing/dressing/bathroom;Two people to help with walking and/or transfers   Can travel by private vehicle   No    Equipment Recommendations None recommended by PT  Recommendations for Other Services       Functional Status Assessment Patient has not had a recent decline in their functional status     Precautions / Restrictions Precautions Precautions: Fall Precaution Comments: dementia      Mobility  Bed Mobility               General bed mobility comments: total of 2, very rigid trunk when placed bed in chair position for a few minutes    Transfers                   General transfer comment: unable to attempt due to lethargiy and rigidity    Ambulation/Gait                  Stairs            Wheelchair Mobility     Tilt Bed    Modified Rankin (Stroke Patients Only)        Balance               Standing balance comment: NT                             Pertinent Vitals/Pain Pain Assessment Pain Assessment: PAINAD Faces Pain Scale: No hurt Breathing: occasional labored breathing, short period of hyperventilation Facial Expression: smiling or inexpressive Body Language: tense, distressed pacing, fidgeting Consolability: no need to console Pain Location: Generalized with movement Pain Descriptors / Indicators: Grimacing, Moaning Pain Intervention(s): Monitored during session    Home Living Family/patient expects to be discharged to:: Skilled nursing facility                   Additional Comments: Per chart, pt was in LTC at [place] until at [facility], DC from [facility] to [facility].    Prior Function Prior Level of Function : Needs assist             Mobility Comments: Per chart, staff provided assist for transfers to/from w/c, unsure of technique though ADLs Comments: appears total care, has a PEG     Hand Dominance   Dominant Hand: Right    Extremity/Trunk Assessment   Upper Extremity Assessment Upper Extremity Assessment: LUE deficits/detail RUE Deficits / Details: resisting ROM LUE Deficits / Details:  edema noted, very rigid with ROM attempts    Lower Extremity Assessment Lower Extremity Assessment:  (tense and rigid with attempts for ROM,)    Cervical / Trunk Assessment Cervical / Trunk Assessment: Other exceptions Cervical / Trunk Exceptions: tends to keep head to right, one time spontaneous ly turned head to the left.  Communication   Communication:  (non verbal, moans/grunts)  Cognition Arousal/Alertness: Lethargic                                     General Comments: patient's right eye rolled back, does not respond, resists PROM        General Comments      Exercises     Assessment/Plan    PT Assessment Patient does not need any further PT services  PT Problem List Decreased  cognition       PT Treatment Interventions      PT Goals (Current goals can be found in the Care Plan section)  Acute Rehab PT Goals Patient Stated Goal: did not state    Frequency       Co-evaluation               AM-PAC PT "6 Clicks" Mobility  Outcome Measure Help needed turning from your back to your side while in a flat bed without using bedrails?: Total Help needed moving from lying on your back to sitting on the side of a flat bed without using bedrails?: Total Help needed moving to and from a bed to a chair (including a wheelchair)?: Total Help needed standing up from a chair using your arms (e.g., wheelchair or bedside chair)?: Total Help needed to walk in hospital room?: Total Help needed climbing 3-5 steps with a railing? : Total 6 Click Score: 6    End of Session   Activity Tolerance: Patient limited by lethargy Patient left: in bed;with call bell/phone within reach;with bed alarm set Nurse Communication: Mobility status;Need for lift equipment PT Visit Diagnosis: Hemiplegia and hemiparesis;Other symptoms and signs involving the nervous system (R29.898)    Time: 1610-9604 PT Time Calculation (min) (ACUTE ONLY): 16 min   Charges:   PT Evaluation $PT Eval Low Complexity: 1 Low   PT General Charges $$ ACUTE PT VISIT: 1 Visit         Blanchard Kelch PT Acute Rehabilitation Services Office [phone] Weekend [phone]   Rada Hay 10/05/2022, 1:52 PM

## 2022-10-05 NOTE — Plan of Care (Signed)
  Problem: Health Behavior/Discharge Planning: Goal: Ability to manage health-related needs will improve Outcome: Progressing   Problem: Clinical Measurements: Goal: Ability to maintain clinical measurements within normal limits will improve Outcome: Progressing Goal: Respiratory complications will improve Outcome: Progressing   Problem: Nutrition: Goal: Adequate nutrition will be maintained Outcome: Progressing   Problem: Skin Integrity: Goal: Risk for impaired skin integrity will decrease Outcome: Progressing

## 2022-10-05 NOTE — Progress Notes (Addendum)
[name] Progress Note   Subjective:   Sleepy - will state name and wiggle toes but not providing other history. I/Os yest 2.5 / UOP.  yellow urine in foley bag.  CT head done this AM - appears was ordered for AMS.  ABG done this AM 7.35 / 29/72.  No family present.   Objective Vitals:   10/05/22 0700 10/05/22 0715 10/05/22 0733 10/05/22 0800  BP: 118/64   (!) 133/57  Pulse: 64   (!) 58  Resp: 14   (!) 23  Temp:  (!) 97.1 F (36.2 C)    TempSrc:  Axillary    SpO2: 96%  95% 98%  Weight:      Height:       Physical Exam General: chronically ill but calm Heart:RRR no rub Lungs: loose rhonchi R > L, currently on RA sat 96% during exam Abdomen: soft, PEG with small amount of drainage noted on dressing - looks like the color of tube feed running, whitish.  No TTP Extremities:1+ diffuse edema Neuro: says name, not year or location, wiggles toe  Additional Objective Labs: Basic Metabolic Panel: Recent Labs  Lab 10/02/22 1103 10/03/22 0359 10/04/22 0531 10/05/22 0500  NA 135 139 138 137  K 4.1 3.7 3.3* 3.8  CL 104 108 108 108  CO2 18* 17* 17* 17*  GLUCOSE 184* 130* 170* 170*  BUN 91* 88* 92* 96*  CREATININE 3.41* 3.30* 3.44* 3.85*  CALCIUM 8.2* 8.0* 7.7* 7.7*  PHOS 4.5  --   --  5.7*   Liver Function Tests: Recent Labs  Lab 10/02/22 1103 10/03/22 0359 10/04/22 0531  AST 24 27 21   ALT 31 33 28  ALKPHOS 78 73 85  BILITOT 1.1 1.0 0.9  PROT 6.5 5.9* 5.6*  ALBUMIN 2.4* 2.1* 2.0*   No results for input(s): "LIPASE", "AMYLASE" in the last 168 hours. CBC: Recent Labs  Lab 10/02/22 1103 10/03/22 0359 10/04/22 0531 10/05/22 0500  WBC 17.2* 20.1* 16.5* 10.0  NEUTROABS 13.9*  --   --   --   HGB 6.9* 7.6* 7.0* 7.2*  HCT 22.1* 23.2* 21.6* 22.3*  MCV 90.6 87.2 87.1 88.1  PLT 326 335 333 366   Blood Culture    Component Value Date/Time   SDES  10/03/2022 1609    BLOOD RIGHT HAND Performed at [facility], [address],  [place], [place] [address]    SPECREQUEST  10/03/2022 1609    BOTTLES DRAWN AEROBIC AND ANAEROBIC Blood Culture adequate volume Performed at [facility], [address], [place], [place] [address]    CULT  10/03/2022 1609    NO GROWTH 2 DAYS Performed at [facility], [address], [place], [place] [address]    REPTSTATUS PENDING 10/03/2022 1609    Cardiac Enzymes: No results for input(s): "CKTOTAL", "CKMB", "CKMBINDEX", "TROPONINI" in the last 168 hours. CBG: Recent Labs  Lab 10/04/22 1917 10/04/22 2346 10/05/22 0547 10/05/22 0722 10/05/22 0826  GLUCAP 189* 165* 170* 160* 163*   Iron Studies:  Recent Labs    10/04/22 1857  IRON 21*  TIBC 176*  FERRITIN 126   @lablastinr3 @ Studies/Results: VAS [place] UPPER EXTREMITY VENOUS DUPLEX  Result Date: 10/04/2022 UPPER VENOUS STUDY  Patient Name:  Harry [name]  Date of Exam:   10/04/2022 Medical Rec #: [id number]         Accession #:    [id number] Date of Birth: 14-Sep-1945  Patient Gender: M Patient Age:   77 years Exam Location:  [facility] Procedure:      VAS [place] UPPER EXTREMITY VENOUS DUPLEX Referring Phys: RALPH NETTEY --------------------------------------------------------------------------------  Indications: Swelling Limitations: Line. Comparison Study: No prior studies. Performing Technologist: Chanda Busing RVT  Examination Guidelines: A complete evaluation includes B-mode imaging, spectral Doppler, color Doppler, and power Doppler as needed of all accessible portions of each vessel. Bilateral testing is considered an integral part of a complete examination. Limited examinations for reoccurring indications may be performed as noted.  Right Findings: +----------+------------+---------+-----------+----------+-------+ RIGHT     CompressiblePhasicitySpontaneousPropertiesSummary +----------+------------+---------+-----------+----------+-------+ Subclavian    Full       Yes       Yes                       +----------+------------+---------+-----------+----------+-------+  Left Findings: +----------+------------+---------+-----------+----------+-------+ LEFT      CompressiblePhasicitySpontaneousPropertiesSummary +----------+------------+---------+-----------+----------+-------+ IJV           Full       Yes       Yes                      +----------+------------+---------+-----------+----------+-------+ Subclavian    Full       Yes       Yes                      +----------+------------+---------+-----------+----------+-------+ Axillary      Full       Yes       Yes                      +----------+------------+---------+-----------+----------+-------+ Brachial      Full                                          +----------+------------+---------+-----------+----------+-------+ Radial        Full                                          +----------+------------+---------+-----------+----------+-------+ Ulnar         Full                                          +----------+------------+---------+-----------+----------+-------+ Cephalic      Full                                          +----------+------------+---------+-----------+----------+-------+ Basilic       Full                                          +----------+------------+---------+-----------+----------+-------+  Summary:  Right: No evidence of thrombosis in the subclavian.  Left: No evidence of deep vein thrombosis in the upper extremity. No evidence of superficial vein thrombosis in the upper extremity.  *See table(s) above for measurements and observations.  Diagnosing physician: Sherald Hess MD Electronically signed by Sherald Hess MD on 10/04/2022 at 1:19:45 PM.  Final    CT ABDOMEN PELVIS WO CONTRAST  Result Date: 10/03/2022 CLINICAL DATA:  Leaking PEG tube, cellulitis EXAM: CT ABDOMEN AND PELVIS WITHOUT CONTRAST TECHNIQUE: Multidetector CT imaging of the  abdomen and pelvis was performed following the standard protocol without IV contrast. RADIATION DOSE REDUCTION: This exam was performed according to the departmental dose-optimization program which includes automated exposure control, adjustment of the mA and/or kV according to patient size and/or use of iterative reconstruction technique. COMPARISON:  01/07/2023 FINDINGS: Lower chest: There are moderate bilateral pleural effusions with compressive bilateral lower lobe atelectasis. Hepatobiliary: Stable cholelithiasis without evidence of acute cholecystitis. Unremarkable unenhanced appearance of the liver. Pancreas: Unremarkable unenhanced appearance. Spleen: Unremarkable unenhanced appearance. Adrenals/Urinary Tract: No urinary tract calculi or obstructive uropathy within either kidney. Vascular calcifications are seen at the renal hila. The adrenals are unremarkable. Bladder is minimally distended, with nonspecific bladder wall thickening. Stomach/Bowel: No bowel obstruction or ileus. Normal appendix right lower quadrant. No bowel wall thickening or inflammatory change. There is a PEG tube, with the balloon inflated within the gastric lumen. There is no evidence of oral contrast extravasation surrounding the percutaneous gastrostomy tube. There is fat stranding and rectus sheath edema surrounding the peg tube within the left anterior abdominal wall, which may be related to recent placement. Vascular/Lymphatic: Aortic atherosclerosis. No enlarged abdominal or pelvic lymph nodes. Reproductive: Stable enlarged prostate. Other: Trace ascites. No free intraperitoneal gas. No abdominal wall hernia. Musculoskeletal: Diffuse body wall edema. No acute or destructive bony abnormalities. Reconstructed images demonstrate no additional findings. IMPRESSION: 1. Peg tube within the gastric lumen. Subcutaneous fat stranding and intramuscular edema surround the PEG tube site within the left anterior abdominal wall, which could be  due to recent placement or soft tissue inflammation/infection. There is no evidence of oral contrast extravasation. 2. Moderate bilateral pleural effusions with compressive bilateral lower lobe atelectasis. 3. Trace ascites. 4. Diffuse body wall edema. 5. Cholelithiasis without cholecystitis. 6. Stable enlarged prostate. Nonspecific bladder wall thickening could be due to chronic bladder outlet obstruction or under distension. 7.  Aortic Atherosclerosis (ICD10-I70.0). Electronically Signed   By: Sharlet Salina M.D.   On: 10/03/2022 18:01   Medications:  feeding supplement (JEVITY 1.5 CAL/FIBER) 30 mL/hr at 10/05/22 0606   piperacillin-tazobactam      sodium chloride   Intravenous Once   alteplase  2 mg Intracatheter Once   carvedilol  12.5 mg Per Tube BID   Chlorhexidine Gluconate Cloth  6 each Topical Q0600   enoxaparin (LOVENOX) injection  30 mg Subcutaneous Q24H   ferrous sulfate  220 mg Per Tube Daily   furosemide  60 mg Intravenous Q12H   insulin aspart  0-9 Units Subcutaneous Q4H   ipratropium-albuterol  3 mL Nebulization TID   multivitamin  15 mL Per Tube Daily   pyridostigmine  30 mg Per Tube Q8H   rosuvastatin  5 mg Per Tube QHS   sertraline  25 mg Per Tube Daily   sodium chloride flush  10-40 mL Intracatheter Q12H   vancomycin variable dose per unstable renal function (pharmacist dosing)   Does not apply See admin instructions   Assessment/Plan:   # AKI - Pre-renal insult of hypoalbuminemia and in setting of infection compounded by 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  Intake UA with 11-20 RBC and negative for protein - He has a foley in place - agree for now - Would choose an alternative antibiotics if possible please (some evidence vanc/zosyn combo can be nephrotoxic) - Given the finding  of diffuse body wall edema and moderate bilateral lower pleural effusions will discontinue fluids  - lasix 60 mg IV every 12 hours for now - can reassess per trends.   -cont to hold ARB -no  current indications for dialysis   # Bacteremia - Granulicatella Adiacens  - Antibiotics per primary team.   - As above, would choose an alternative antibiotic if possible please as vanc is likely contributing to AKI    # HTN  - controlled on current regimen    # Urinary retention - mild - on flomax - Note that a foley catheter was placed - agree    # Hematuria - microscopic - may be in setting of foley/trauma  - repeat UA   # Anemia normocytic - PRBC's per primary team discretion - got some yesterday per his daughter - Previously with severe iron deficiency anemia - will add on iron panel     # CKD stage 3 - baseline Cr seems to be in the mid 1's  #metabolic acidosis: mild, follow   Will follow, please reach out with concerns.   Estill Bakes MD 10/05/2022, 11:33 AM  [facility] Pager: [phone]

## 2022-10-05 NOTE — Progress Notes (Signed)
SLP Cancellation Note  Patient Details Name: Harry [name] MRN: [id number] DOB: 01-24-1946   Cancelled treatment:       Reason Eval/Treat Not Completed: Other (comment) (RN reports pt lethargic which is different than his baseline; will continue efforts; pt has a PEG for nutrition; recommend frequent oral care)  Rolena Infante, MS [facility] SLP Acute Rehab Services Office [phone]   Chales Abrahams 10/05/2022, 9:42 AM

## 2022-10-05 NOTE — Progress Notes (Signed)
PROGRESS NOTE    Harry [name]  UJW:[id number] DOB: 1946-01-17 DOA: 10/02/2022 PCP: System, Provider Not In   Brief Narrative: Harry [name] is a 77 y.o. male with a history of bronchitis secondary to metapneumovirus, ischemic stroke, pyelonephritis, CKD stage IIIb, community-acquired pneumonia, hypertension, peripheral neuropathy, tobacco use, peripheral vascular disease, bacteremia currently on antibiotics.  Patient presented secondary to dehydration and associated AKI.  Concern for possible cellulitis at PEG site.  Patient's antibiotics were escalated to vancomycin and Zosyn.  Patient started on IV fluids for AKI.   Assessment and Plan:  AKI on CKD stage IIIb Recent baseline creatinine of 1. Creatinine on admission of 3.41. Presumed secondary to dehydration but is complicated by Vancomycin for bacteremia treatment. Patient started on IV fluids. Creatinine stable at 3.44 this morning. Patient with associated anasarca, likely needing Lasix. -Daily BMP -Continue IV fluids -Nephrology consult  Cellulitis Location is the upper abdomen at PEG site.  Presumed source of infection.  Patient with associated leukocytosis.  Complicated by history of bacteremia.  PEG tube recently placed.  Patient also also has a history of leaking from PEG site which may have caused local irritation rather than true cellulitis.  Zosyn added to treatment to more broadly cover.  Leukocytosis Presumed secondary to cellulitis, but patient also with concern for possible pneumonia on imaging.  Patient also appears to have difficulty with secretions which places aspiration at high risk for etiology.  Patient is on antibiotics for presumed cellulitis in addition to bacteremia. -Continue Zosyn for possible aspiration pneumonia  Lethargy Unclear etiology. Possibly related to developing uremia symptoms or possibly waxing waning mentation related to underlying cognitive disease. ABG ordered and does not suggest a  respiratory cause for presentation. Unable to perform adequate neurologic exam. -CT head  History of granulicatella adiacens bacteremia Present on admission.  Patient is prescribed vancomycin IV with prior plan for treatment until 10/22/2022.  Vancomycin resumed on admission, renally dosed.  Daughter is adamant that patient has a line infection and requests removal of his internal jugular line. -Vancomycin per pharmacy -ID consulted; recommendations pending  Leaking PEG tube Recently placed at [facility]. Unsure if patient is safe for oral intake which may have been initial indication for placement. CT abdomen/pelvis with proper placement seen. -Speech therapy recommendations: NPO except meds crushed with puree (if not using PEG); moderate aspiration risk -Resumed tube feeds at slow rate 20 mL/hr and titrate upward  Acute urinary retention Unclear etiology. Foley catheter placed on 7/31. Urinalysis unremarkable. -Continue foley catheter  Bilateral pleural effusions Moderate in size per CT read. In setting of anasarca. -Will watch respiratory status carefully  Anasarca Noted on CT scan with patient showing clinical evidence of generalized edema. Likely related to overall nutrition status. -Lasix/albumin per nephrology  Acute on chronic anemia Baseline hemoglobin of about 8. Hemoglobin down to 6.9 on admission. Patient transfused 1 unit of PRBC. Bilirubin normal. INR elevated at 1.4. LDH normal. Hemoglobin appears to have stabilized. -FOBT ordered and pending -Transfuse for hemoglobin <7  Hyperlipidemia -Continue Crestor once able  Primary hypertension -Continue Coreg once able  Diabetes mellitus type 2 Semglee discontinued secondary to down-trending blood sugar and current lack of nutrition intake. -Continue sliding scale insulin  Hypokalemia -Potassium supplementation per tube  Hemiplegia History of CVA Patient is on Plavix as an outpatient which is held secondary to anemia of  unclear etiology  Lewy body dementia Noted. Patient seen and evaluated by palliative care medicine at last hospitalization. Will defer consult at  this time.  Pressure injury Right heel, mid sacrum. Present on admission.  Left arm swelling Per daughter, patient was diagnosed with a RUE DVT at [facility], not on anticoagulation secondary to bleeding risk. Will need to obtain records from [facility]. LUE extremity duplex negative for acute/chronic DVT or SVT.    DVT prophylaxis: Lovenox Code Status:   Code Status: Full Code Family Communication: Daughter via telephone (8/2) Disposition Plan: Discharge pending improvement of AKI, nephrology/ID recommendations   Consultants:  Infectious disease Nephrology  Procedures: None  Antimicrobials: Vancomycin Zosyn   Subjective: Patient is unable to participate in exam today.  Objective: BP (!) 133/57 (BP Location: Left Arm)   Pulse (!) 58   Temp (!) 97.1 F (36.2 C) (Axillary)   Resp (!) 23   Ht 5\' 9"  (1.753 m)   Wt 91.8 kg   SpO2 98%   BMI 29.89 kg/m   Examination:  General exam: Appears calm and comfortable Respiratory system: Rhonchi. Respiratory effort normal. Cardiovascular system: S1 & S2 heard, RRR. No murmurs, rubs, gallops or clicks. Gastrointestinal system: Abdomen is nondistended, soft and nontender. Normal bowel sounds heard. Central nervous system: Obtunded. Responds to pain but does not localize.  Musculoskeletal: Diffuse edema.   Data Reviewed: I have personally reviewed following labs and imaging studies  CBC Lab Results  Component Value Date   WBC 10.0 10/05/2022   RBC 2.53 (L) 10/05/2022   HGB 7.2 (L) 10/05/2022   HCT 22.3 (L) 10/05/2022   MCV 88.1 10/05/2022   MCH 28.5 10/05/2022   PLT 366 10/05/2022   MCHC 32.3 10/05/2022   RDW 17.6 (H) 10/05/2022   LYMPHSABS 1.0 10/02/2022   MONOABS 2.0 (H) 10/02/2022   EOSABS 0.1 10/02/2022   BASOSABS 0.1 10/02/2022     Last metabolic panel Lab Results   Component Value Date   NA 137 10/05/2022   K 3.8 10/05/2022   CL 108 10/05/2022   CO2 17 (L) 10/05/2022   BUN 96 (H) 10/05/2022   CREATININE 3.85 (H) 10/05/2022   GLUCOSE 170 (H) 10/05/2022   GFRNONAA 16 (L) 10/05/2022   GFRAA 51 (L) 10/30/2019   CALCIUM 7.7 (L) 10/05/2022   PHOS 5.7 (H) 10/05/2022   PROT 5.6 (L) 10/04/2022   ALBUMIN 2.0 (L) 10/04/2022   LABGLOB 3.2 11/11/2015   AGRATIO 1.3 11/11/2015   BILITOT 0.9 10/04/2022   ALKPHOS 85 10/04/2022   AST 21 10/04/2022   ALT 28 10/04/2022   ANIONGAP 12 10/05/2022    GFR: Estimated Creatinine Clearance: 18.5 mL/min (A) (by C-G formula based on SCr of 3.85 mg/dL (H)).  Recent Results (from the past 240 hour(s))  MRSA Next Gen by PCR, Nasal     Status: None   Collection Time: 10/02/22  4:35 AM   Specimen: Nasal Mucosa; Nasal Swab  Result Value Ref Range Status   MRSA by PCR Next Gen NOT DETECTED NOT DETECTED Final    Comment: (NOTE) The GeneXpert MRSA Assay (FDA approved for NASAL specimens only), is one component of a comprehensive MRSA colonization surveillance program. It is not intended to diagnose MRSA infection nor to guide or monitor treatment for MRSA infections. Test performance is not FDA approved in patients less than 1 years old. Performed at [facility], [address], [place], [place] [address]   C Difficile Quick Screen w PCR reflex     Status: None   Collection Time: 10/02/22 12:05 PM   Specimen: STOOL  Result Value Ref Range Status  C Diff antigen NEGATIVE NEGATIVE Final   C Diff toxin NEGATIVE NEGATIVE Final   C Diff interpretation No C. difficile detected.  Final    Comment: Performed at [facility], [address], [place], [place] [address]  Culture, blood (Routine X 2) w Reflex to ID Panel     Status: None (Preliminary result)   Collection Time: 10/03/22  4:04 PM   Specimen: BLOOD RIGHT HAND  Result Value Ref Range Status   Specimen Description    Final    BLOOD RIGHT HAND Performed at [facility], [address], [place], [place] [address]    Special Requests   Final    BOTTLES DRAWN AEROBIC AND ANAEROBIC Blood Culture adequate volume Performed at [facility], [address] [place], [place] [address]    Culture   Final    NO GROWTH 2 DAYS Performed at [facility], [address], [place], [place] [address]    Report Status PENDING  Incomplete  Culture, blood (Routine X 2) w Reflex to ID Panel     Status: None (Preliminary result)   Collection Time: 10/03/22  4:09 PM   Specimen: BLOOD RIGHT HAND  Result Value Ref Range Status   Specimen Description   Final    BLOOD RIGHT HAND Performed at [facility], [address] [place], [place] [address]    Special Requests   Final    BOTTLES DRAWN AEROBIC AND ANAEROBIC Blood Culture adequate volume Performed at [facility], [address] [place], [place] [address]    Culture   Final    NO GROWTH 2 DAYS Performed at [facility], [address] [place], [place] [address]    Report Status PENDING  Incomplete      Radiology Studies: VAS [place] UPPER EXTREMITY VENOUS DUPLEX  Result Date: 10/04/2022 UPPER VENOUS STUDY  Patient Name:  Harry [name]  Date of Exam:   10/04/2022 Medical Rec #: [id number]         Accession #:    [id number] Date of Birth: 1946/01/16          Patient Gender: M Patient Age:   14 years Exam Location:  [facility] Procedure:      VAS [place] UPPER EXTREMITY VENOUS DUPLEX Referring Phys:   --------------------------------------------------------------------------------  Indications: Swelling Limitations: Line. Comparison Study: No prior studies. Performing Technologist: Chanda Busing RVT  Examination Guidelines: A complete evaluation includes B-mode imaging, spectral Doppler, color Doppler, and power Doppler as needed of all accessible portions of each vessel. Bilateral testing is  considered an integral part of a complete examination. Limited examinations for reoccurring indications may be performed as noted.  Right Findings: +----------+------------+---------+-----------+----------+-------+ RIGHT     CompressiblePhasicitySpontaneousPropertiesSummary +----------+------------+---------+-----------+----------+-------+ Subclavian    Full       Yes       Yes                      +----------+------------+---------+-----------+----------+-------+  Left Findings: +----------+------------+---------+-----------+----------+-------+ LEFT      CompressiblePhasicitySpontaneousPropertiesSummary +----------+------------+---------+-----------+----------+-------+ IJV           Full       Yes       Yes                      +----------+------------+---------+-----------+----------+-------+ Subclavian    Full       Yes       Yes                      +----------+------------+---------+-----------+----------+-------+  Axillary      Full       Yes       Yes                      +----------+------------+---------+-----------+----------+-------+ Brachial      Full                                          +----------+------------+---------+-----------+----------+-------+ Radial        Full                                          +----------+------------+---------+-----------+----------+-------+ Ulnar         Full                                          +----------+------------+---------+-----------+----------+-------+ Cephalic      Full                                          +----------+------------+---------+-----------+----------+-------+ Basilic       Full                                          +----------+------------+---------+-----------+----------+-------+  Summary:  Right: No evidence of thrombosis in the subclavian.  Left: No evidence of deep vein thrombosis in the upper extremity. No evidence of superficial vein thrombosis in  the upper extremity.  *See table(s) above for measurements and observations.  Diagnosing physician: Sherald Hess MD Electronically signed by Sherald Hess MD on 10/04/2022 at 1:19:45 PM.    Final    CT ABDOMEN PELVIS WO CONTRAST  Result Date: 10/03/2022 CLINICAL DATA:  Leaking PEG tube, cellulitis EXAM: CT ABDOMEN AND PELVIS WITHOUT CONTRAST TECHNIQUE: Multidetector CT imaging of the abdomen and pelvis was performed following the standard protocol without IV contrast. RADIATION DOSE REDUCTION: This exam was performed according to the departmental dose-optimization program which includes automated exposure control, adjustment of the mA and/or kV according to patient size and/or use of iterative reconstruction technique. COMPARISON:  01/07/2023 FINDINGS: Lower chest: There are moderate bilateral pleural effusions with compressive bilateral lower lobe atelectasis. Hepatobiliary: Stable cholelithiasis without evidence of acute cholecystitis. Unremarkable unenhanced appearance of the liver. Pancreas: Unremarkable unenhanced appearance. Spleen: Unremarkable unenhanced appearance. Adrenals/Urinary Tract: No urinary tract calculi or obstructive uropathy within either kidney. Vascular calcifications are seen at the renal hila. The adrenals are unremarkable. Bladder is minimally distended, with nonspecific bladder wall thickening. Stomach/Bowel: No bowel obstruction or ileus. Normal appendix right lower quadrant. No bowel wall thickening or inflammatory change. There is a PEG tube, with the balloon inflated within the gastric lumen. There is no evidence of oral contrast extravasation surrounding the percutaneous gastrostomy tube. There is fat stranding and rectus sheath edema surrounding the peg tube within the left anterior abdominal wall, which may be related to recent placement. Vascular/Lymphatic: Aortic atherosclerosis. No enlarged abdominal or pelvic lymph nodes. Reproductive: Stable enlarged prostate. Other:  Trace ascites. No free intraperitoneal gas. No  abdominal wall hernia. Musculoskeletal: Diffuse body wall edema. No acute or destructive bony abnormalities. Reconstructed images demonstrate no additional findings. IMPRESSION: 1. Peg tube within the gastric lumen. Subcutaneous fat stranding and intramuscular edema surround the PEG tube site within the left anterior abdominal wall, which could be due to recent placement or soft tissue inflammation/infection. There is no evidence of oral contrast extravasation. 2. Moderate bilateral pleural effusions with compressive bilateral lower lobe atelectasis. 3. Trace ascites. 4. Diffuse body wall edema. 5. Cholelithiasis without cholecystitis. 6. Stable enlarged prostate. Nonspecific bladder wall thickening could be due to chronic bladder outlet obstruction or under distension. 7.  Aortic Atherosclerosis (ICD10-I70.0). Electronically Signed   By: Sharlet Salina M.D.   On: 10/03/2022 18:01      LOS: 3 days    Jacquelin Hawking, MD [facility] 10/05/2022, 10:01 AM   If 7PM-7AM, please contact night-coverage [email or web address]

## 2022-10-05 NOTE — Progress Notes (Addendum)
Pharmacy Antibiotic Note  Harry [name] is a 77 y.o. male admitted on 10/02/2022.  Pharmacy has been consulted for Vancomycin dosing.  Recent admission (7/4-7/15) with Granulicatella adiacens bacteremia with treatment plan for 6 weeks of IV vancomycin through 8/19.  Direct admit from [facility] on 7/29-7/30 due to fever, sepsis, renal failure, feeding tube redness/swelling/cellulitis.  Today, 10/05/2022: WBC decreased to 10 Afebrile SCr remains elevated at 3.85 and rising (baseline SCr 1.01 on 7/15) Recent Vanc doses: 7/28 Vanc 1500mg  at 2pm.  7/30 Vanc 1750mg  at 8am Vanc level 28 this AM at 0500. Of note, vanc level has decreased from 41 to 28 over ~48hr with last vanc dose ~69hrs ago  Plan: Of note, renal Md recommended changing vancomycin to another antibiotic - per discussion with ID pharmacist, vanc is the best choice for  Granulicatella adiacens bacteremia, but could consider using Zyvox. In any event, would not need to consider switch to Zyvox until Vanc is out of patient's system with level < 10 and it's still elevated this AM. Checking daily vanc levels to assess clearance No further vancomycin doses, will dose based on renal function/levels.   Measure Vanc levels as needed.  Goal AUC = 400 - 550. Zosyn dosing adjusted to EI q12 due to CrCl < 20. Would suggest narrowing Zosyn to Unasyn for PEG site cellulitis  Height: 5\' 9"  (175.3 cm) Weight: 91.8 kg (202 lb 6.1 oz) IBW/kg (Calculated) : 70.7  Temp (24hrs), Avg:97.5 F (36.4 C), Min:97.1 F (36.2 C), Max:98.1 F (36.7 C)  Recent Labs  Lab 10/02/22 1103 10/03/22 0359 10/03/22 0359 10/04/22 0531 10/05/22 0500  WBC 17.2* 20.1*  --  16.5* 10.0  CREATININE 3.41* 3.30*  --  3.44* 3.85*  VANCORANDOM  --  41   < > 35 28   < > = values in this interval not displayed.    Estimated Creatinine Clearance: 18.5 mL/min (A) (by C-G formula based on SCr of 3.85 mg/dL (H)).    Allergies  Allergen Reactions   Lipitor  [Atorvastatin] Other (See Comments)    Myalgias   Statins Other (See Comments)    Myalgias  Pt tolerating rosuvastatin 5mg  QD   Pravachol [Pravastatin] Rash    Antimicrobials this admission: PTA Vancomycin (7/4) >>  7/30 Zosyn >>   Dose adjustments this admission: 7/31 Vanc random level:  41 (~ 20 hours after last dose) 8/1 Vanc random level: 35 8/2 Vanc random level: 28  Microbiology results: 7/30 MRSA PCR: not detected 7/30 CDiff: Ag neg, toxin neg 7/31 BCx: ngtd   Thank you for allowing pharmacy to be a part of this patient's care.  Hessie Knows, PharmD, BCPS Secure Chat if ?s 10/05/2022 11:00 AM

## 2022-10-05 NOTE — Progress Notes (Signed)
PHARMACY NOTE:  ANTIMICROBIAL RENAL DOSAGE ADJUSTMENT  Current antimicrobial regimen includes a mismatch between antimicrobial dosage and estimated renal function. As per policy approved by the Pharmacy & Therapeutics and Medical Executive Committees, the antimicrobial dosage will be adjusted accordingly.  Current antimicrobial and dosage:  Zosyn 3.375 g q8 hr  Indication: cellulitis  Renal Function:   Estimated Creatinine Clearance: 18.5 mL/min (A) (by C-G formula based on SCr of 3.85 mg/dL (H)). []      On intermittent HD, scheduled: []      On CRRT    Antimicrobial dosage has been changed to:  Zosyn 3.375 g IV q12 hr   Additional Comments: extended infusion   Thank you for allowing pharmacy to be a part of this patient's care.  Bernadene Person, PharmD, BCPS [phone] 10/05/2022, 6:47 AM
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
## 2022-10-06 DIAGNOSIS — I10 Essential (primary) hypertension: Secondary | ICD-10-CM | POA: Diagnosis not present

## 2022-10-06 DIAGNOSIS — N179 Acute kidney failure, unspecified: Secondary | ICD-10-CM | POA: Diagnosis not present

## 2022-10-06 DIAGNOSIS — G3183 Dementia with Lewy bodies: Secondary | ICD-10-CM | POA: Diagnosis not present

## 2022-10-06 DIAGNOSIS — L03311 Cellulitis of abdominal wall: Secondary | ICD-10-CM | POA: Diagnosis not present

## 2022-10-06 LAB — GLUCOSE, CAPILLARY
Glucose-Capillary: 133 mg/dL — ABNORMAL HIGH (ref 70–99)
Glucose-Capillary: 134 mg/dL — ABNORMAL HIGH (ref 70–99)
Glucose-Capillary: 162 mg/dL — ABNORMAL HIGH (ref 70–99)
Glucose-Capillary: 187 mg/dL — ABNORMAL HIGH (ref 70–99)
Glucose-Capillary: 195 mg/dL — ABNORMAL HIGH (ref 70–99)
Glucose-Capillary: 200 mg/dL — ABNORMAL HIGH (ref 70–99)

## 2022-10-06 NOTE — Progress Notes (Signed)
Received IV consult to start PIV. Right arm restricted due to previous DVT. Assessed left upper/lower arm with ultrasound. Left arm with 4+ edema. No suitable veins found. Recommend to leave current central line in place. Notified primary RN Maggie.

## 2022-10-06 NOTE — Plan of Care (Signed)

## 2022-10-06 NOTE — Progress Notes (Signed)
[facility] Progress Note   Subjective:   No major changes. Pt lethargic this AM and not answering my questions but is arousable.  I/Os yest 0.2 / UOP. CT head yest no acute changes. No family present.   Objective Vitals:   10/06/22 0735 10/06/22 0800 10/06/22 0900 10/06/22 1000  BP:  (!) 156/65 (!) 151/66 (!) 145/57  Pulse:    64  Resp:  16 19 20   Temp: (!) 96.7 F (35.9 C)     TempSrc: Axillary     SpO2:   100% 100%  Weight:      Height:       Physical Exam General: chronically ill but calm Heart:RRR no rub Lungs: loose rhonchi BL, currently on RA sat 96% during exam Abdomen: soft, PEG with dressing clean and dry.  No TTP.  TF running Extremities:1-2+ diffuse edema hips and arms. Neuro: sleepy  Additional Objective Labs: Basic Metabolic Panel: Recent Labs  Lab 10/02/22 1103 10/03/22 0359 10/04/22 0531 10/05/22 0500 10/06/22 0830  NA 135   < > 138 137 141  K 4.1   < > 3.3* 3.8 3.6  CL 104   < > 108 108 110  CO2 18*   < > 17* 17* 18*  GLUCOSE 184*   < > 170* 170* 140*  BUN 91*   < > 92* 96* 95*  CREATININE 3.41*   < > 3.44* 3.85* 3.52*  CALCIUM 8.2*   < > 7.7* 7.7* 8.1*  PHOS 4.5  --   --  5.7* 6.2*   < > = values in this interval not displayed.   Liver Function Tests: Recent Labs  Lab 10/02/22 1103 10/03/22 0359 10/04/22 0531 10/06/22 0830  AST 24 27 21   --   ALT 31 33 28  --   ALKPHOS 78 73 85  --   BILITOT 1.1 1.0 0.9  --   PROT 6.5 5.9* 5.6*  --   ALBUMIN 2.4* 2.1* 2.0* 2.1*   No results for input(s): "LIPASE", "AMYLASE" in the last 168 hours. CBC: Recent Labs  Lab 10/02/22 1103 10/03/22 0359 10/04/22 0531 10/05/22 0500 10/06/22 0830  WBC 17.2* 20.1* 16.5* 10.0 7.7  NEUTROABS 13.9*  --   --   --   --   HGB 6.9* 7.6* 7.0* 7.2* 7.5*  HCT 22.1* 23.2* 21.6* 22.3* 23.7*  MCV 90.6 87.2 87.1 88.1 87.8  PLT 326 335 333 366 404*   Blood Culture    Component Value Date/Time   SDES  10/03/2022 1609    BLOOD RIGHT  HAND Performed at [facility], [address] [place], [place] [address]    SPECREQUEST  10/03/2022 1609    BOTTLES DRAWN AEROBIC AND ANAEROBIC Blood Culture adequate volume Performed at [facility], [address] [place], [place] [address]    CULT  10/03/2022 1609    NO GROWTH 3 DAYS Performed at [facility], [address], [place], [place] [address]    REPTSTATUS PENDING 10/03/2022 1609    Cardiac Enzymes: No results for input(s): "CKTOTAL", "CKMB", "CKMBINDEX", "TROPONINI" in the last 168 hours. CBG: Recent Labs  Lab 10/05/22 1535 10/05/22 1926 10/05/22 2331 10/06/22 0349 10/06/22 0733  GLUCAP 200* 201* 188* 134* 133*   Iron Studies:  Recent Labs    10/04/22 1857  IRON 21*  TIBC 176*  FERRITIN 126   @lablastinr3 @ Studies/Results: CT HEAD WO CONTRAST ( )  Result Date: 10/05/2022 CLINICAL DATA:  Mental status  change of unknown etiology. EXAM: CT HEAD WITHOUT CONTRAST TECHNIQUE: Contiguous axial images were obtained from the base of the skull through the vertex without intravenous contrast. RADIATION DOSE REDUCTION: This exam was performed according to the departmental dose-optimization program which includes automated exposure control, adjustment of the mA and/or kV according to patient size and/or use of iterative reconstruction technique. COMPARISON:  09/06/2022 FINDINGS: Brain: No evidence of acute infarction, hemorrhage, hydrocephalus, extra-axial collection or mass lesion/mass effect. Encephalomalacia is identified within the left cerebellar hemisphere, and left occipital lobe, which appears unchanged from previous exam. Multiple lacunar infarcts are noted within the right cerebellar hemisphere. Chronic bilateral basal ganglia and thalamic lacunar infarcts are also noted. Moderate diffuse periventricular and subcortical white matter hypodensity identified. Prominence of the sulci and ventricles compatible with brain atrophy. Vascular:  No hyperdense vessel or unexpected calcification. Skull: Normal. Negative for fracture or focal lesion. Sinuses/Orbits: The paranasal sinuses and mastoid air cells are clear. Other: None IMPRESSION: 1. No acute intracranial abnormality. 2. Chronic infarcts within the left cerebellar hemisphere, left occipital lobe, and right cerebellar hemisphere. 3. Chronic bilateral basal ganglia and thalamic lacunar infarcts. 4. Moderate chronic microvascular ischemic changes and brain atrophy. Electronically Signed   By: Signa Kell M.D.   On: 10/05/2022 12:19   VAS [place] UPPER EXTREMITY VENOUS DUPLEX  Result Date: 10/04/2022 UPPER VENOUS STUDY  Patient Name:  Harry [name]  Date of Exam:   10/04/2022 Medical Rec #: [id number]         Accession #:    [id number] Date of Birth: May 15, 1945          Patient Gender: M Patient Age:   77 years Exam Location:  [facility] Procedure:      VAS [place] UPPER EXTREMITY VENOUS DUPLEX Referring Phys: RALPH NETTEY --------------------------------------------------------------------------------  Indications: Swelling Limitations: Line. Comparison Study: No prior studies. Performing Technologist: Chanda Busing RVT  Examination Guidelines: A complete evaluation includes B-mode imaging, spectral Doppler, color Doppler, and power Doppler as needed of all accessible portions of each vessel. Bilateral testing is considered an integral part of a complete examination. Limited examinations for reoccurring indications may be performed as noted.  Right Findings: +----------+------------+---------+-----------+----------+-------+ RIGHT     CompressiblePhasicitySpontaneousPropertiesSummary +----------+------------+---------+-----------+----------+-------+ Subclavian    Full       Yes       Yes                      +----------+------------+---------+-----------+----------+-------+  Left Findings: +----------+------------+---------+-----------+----------+-------+ LEFT       CompressiblePhasicitySpontaneousPropertiesSummary +----------+------------+---------+-----------+----------+-------+ IJV           Full       Yes       Yes                      +----------+------------+---------+-----------+----------+-------+ Subclavian    Full       Yes       Yes                      +----------+------------+---------+-----------+----------+-------+ Axillary      Full       Yes       Yes                      +----------+------------+---------+-----------+----------+-------+ Brachial      Full                                          +----------+------------+---------+-----------+----------+-------+  Radial        Full                                          +----------+------------+---------+-----------+----------+-------+ Ulnar         Full                                          +----------+------------+---------+-----------+----------+-------+ Cephalic      Full                                          +----------+------------+---------+-----------+----------+-------+ Basilic       Full                                          +----------+------------+---------+-----------+----------+-------+  Summary:  Right: No evidence of thrombosis in the subclavian.  Left: No evidence of deep vein thrombosis in the upper extremity. No evidence of superficial vein thrombosis in the upper extremity.  *See table(s) above for measurements and observations.  Diagnosing physician: Sherald Hess MD Electronically signed by Sherald Hess MD on 10/04/2022 at 1:19:45 PM.    Final    Medications:  ampicillin-sulbactam (UNASYN) IV Stopped (10/06/22 8657)   feeding supplement (JEVITY 1.5 CAL/FIBER) 40 mL/hr at 10/06/22 0756    sodium chloride   Intravenous Once   alteplase  2 mg Intracatheter Once   carvedilol  12.5 mg Per Tube BID   Chlorhexidine Gluconate Cloth  6 each Topical Q0600   enoxaparin (LOVENOX) injection  30 mg Subcutaneous Q24H    ferrous sulfate  220 mg Per Tube Daily   furosemide  60 mg Intravenous Q12H   insulin aspart  0-9 Units Subcutaneous Q4H   ipratropium-albuterol  3 mL Nebulization TID   multivitamin  15 mL Per Tube Daily   pyridostigmine  30 mg Per Tube Q8H   rosuvastatin  5 mg Per Tube QHS   sertraline  25 mg Per Tube Daily   sodium chloride flush  10-40 mL Intracatheter Q12H   Assessment/Plan:   # AKI on CKD 3: baseline Cr in the mid 1s as recently as earlier this month.  Presented 7/30 with Cr 3.4, peaked 8/2 at 3.8 and improving today at 3.5.  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  Intake UA with 11-20 RBC and negative for protein - He has a foley in place - agree for now - Vanc/zosyn off - lasix 60 mg IV every 12 hours cont same dose for now but if not making progress will increase tomorrow -cont to hold ARB -no current indications for dialysis   # Bacteremia - Granulicatella Adiacens  - Antibiotics per ID consult - plan linezolid to complete course 8/19   # HTN  - controlled on current regimen    # Urinary retention - mild - on flomax - Note that a foley catheter was placed - agree for now but will do TOV soon   # Hematuria - microscopic - may be in setting of foley/trauma  - repeat UA with  same --> suspect foley related still, can recheck after foley removed   # Anemia normocytic - PRBC's per primary team discretion - got some yesterday per his daughter - Previously with severe iron deficiency anemia - iron sat 12% here -- > would dose with IV iron after bacteremia tx complete  #metabolic acidosis: mild, follow - it's improving    Will follow, please reach out with concerns.   Estill Bakes MD 10/06/2022, 11:31 AM  [facility] Pager: [phone]

## 2022-10-06 NOTE — Progress Notes (Signed)
MD removed order to discontinue central line.  Daughter at bedside and updated.  Agreeable to maintain central line for labs and IV ABX since pt has edema in extremities.

## 2022-10-06 NOTE — Progress Notes (Signed)
PROGRESS NOTE    Harry [name]  WNU:[id number] DOB: Apr 10, 1945 DOA: 10/02/2022 PCP: System, Provider Not In   Brief Narrative: Harry [name] is a 77 y.o. male with a history of bronchitis secondary to metapneumovirus, ischemic stroke, pyelonephritis, CKD stage IIIb, community-acquired pneumonia, hypertension, peripheral neuropathy, tobacco use, peripheral vascular disease, bacteremia currently on antibiotics.  Patient presented secondary to dehydration and associated AKI.  Concern for possible cellulitis at PEG site.  Patient's antibiotics were escalated to vancomycin and Zosyn.  Patient started on IV fluids for AKI. Antibiotics adjusted to Linezolid and Unasyn.   Assessment and Plan:  AKI on CKD stage IIIb Recent baseline creatinine of 1. Creatinine on admission of 3.41. Presumed secondary to dehydration but is complicated by Vancomycin for bacteremia treatment. Patient started on IV fluids. Creatinine stable at 3.44 this morning. Patient with associated anasarca, likely needing Lasix. -Daily BMP -Continue IV fluids -Nephrology consult  Cellulitis Location is the upper abdomen at PEG site.  Presumed source of infection.  Patient with associated leukocytosis.  Complicated by history of bacteremia.  PEG tube recently placed.  Patient also also has a history of leaking from PEG site which may have caused local irritation/dermatitis rather than true cellulitis.  Zosyn added to treatment to more broadly cover and transitioned to Unasyn per ID with transition to Augmentin on discharge to complete 10 days of total treatment (EOT 8/8).  Leukocytosis Presumed secondary to cellulitis, but patient also with concern for possible pneumonia on imaging.  Patient also appears to have difficulty with secretions which places aspiration at high risk for etiology.  Patient is on antibiotics for presumed cellulitis in addition to bacteremia. -Continue Zosyn for possible aspiration  pneumonia  Lethargy Unclear etiology. Possibly related to developing uremia symptoms or possibly waxing waning mentation related to underlying cognitive disease. ABG ordered and does not suggest a respiratory cause for presentation. CT head unremarkable for etiology. -Will limit sedating medications  History of granulicatella adiacens bacteremia Present on admission.  Patient is prescribed vancomycin IV with prior plan for treatment until 10/22/2022.  Vancomycin resumed on admission, renally dosed.  Daughter is adamant that patient has a line infection and requests removal of his internal jugular line. ID consulted and have adjusted antibiotics to an oral regimen of Linezolid to complete course, once Vancomycin level has trended down.  Leaking PEG tube Recently placed at [facility]. Unsure if patient is safe for oral intake which may have been initial indication for placement. CT abdomen/pelvis with proper placement seen. -Speech therapy recommendations: NPO except meds crushed with puree (if not using PEG); moderate aspiration risk -Continue tube feeds; started at slow rate 20 mL/hr and titrate upward with goal of 60 mL/hr  Acute urinary retention Unclear etiology. Foley catheter placed on 7/31. Urinalysis unremarkable. -Continue foley catheter  Bilateral pleural effusions Moderate in size per CT read. In setting of anasarca. -Will watch respiratory status carefully  Anasarca Noted on CT scan with patient showing clinical evidence of generalized edema. Likely related to overall nutrition status. -Lasix/albumin per nephrology  Acute on chronic anemia Baseline hemoglobin of about 8. Hemoglobin down to 6.9 on admission. Patient transfused 1 unit of PRBC. Bilirubin normal. INR elevated at 1.4. LDH normal. Hemoglobin appears to have stabilized. -Transfuse for hemoglobin <7  Hyperlipidemia -Continue Crestor once able  Primary hypertension -Continue Coreg once able  Diabetes mellitus type  2 Semglee discontinued secondary to down-trending blood sugar and current lack of nutrition intake. -Continue sliding scale insulin  Hypokalemia -Potassium  supplementation per tube  Hemiplegia History of CVA Patient is on Plavix as an outpatient which is held secondary to anemia of unclear etiology  Lewy body dementia Noted. Patient seen and evaluated by palliative care medicine at last hospitalization. Will defer consult at this time.  Pressure injury Right heel, mid sacrum. Present on admission.  Left arm swelling Per daughter, patient was diagnosed with a RUE DVT at [facility], not on anticoagulation secondary to bleeding risk. Will need to obtain records from [facility]. LUE extremity duplex negative for acute/chronic DVT or SVT.    DVT prophylaxis: Lovenox Code Status:   Code Status: Full Code Family Communication: Daughter via telephone (8/3) Disposition Plan: Discharge pending improvement of AKI, nephrology/ID recommendations   Consultants:  Infectious disease Nephrology  Procedures: None  Antimicrobials: Vancomycin Zosyn   Subjective: No chest pain, no dyspnea.  Objective: BP (!) 145/57   Pulse 64   Temp (!) 96.7 F (35.9 C) (Axillary)   Resp 20   Ht 5\' 9"  (1.753 m)   Wt 91.8 kg   SpO2 100%   BMI 29.89 kg/m   Examination:  General exam: Appears calm and comfortable Respiratory system: Clear to auscultation. Respiratory effort normal. Cardiovascular system: S1 & S2 heard, Normal rate with regular rhythm Gastrointestinal system: Abdomen is nondistended, soft and nontender. Normal bowel sounds heard. Central nervous system: Asleep but arouses easily and oriented to self Musculoskeletal: Diffuse edema. No calf tenderness   Data Reviewed: I have personally reviewed following labs and imaging studies  CBC Lab Results  Component Value Date   WBC 7.7 10/06/2022   RBC 2.70 (L) 10/06/2022   HGB 7.5 (L) 10/06/2022   HCT 23.7 (L) 10/06/2022   MCV 87.8  10/06/2022   MCH 27.8 10/06/2022   PLT 404 (H) 10/06/2022   MCHC 31.6 10/06/2022   RDW 17.6 (H) 10/06/2022   LYMPHSABS 1.0 10/02/2022   MONOABS 2.0 (H) 10/02/2022   EOSABS 0.1 10/02/2022   BASOSABS 0.1 10/02/2022     Last metabolic panel Lab Results  Component Value Date   NA 141 10/06/2022   K 3.6 10/06/2022   CL 110 10/06/2022   CO2 18 (L) 10/06/2022   BUN 95 (H) 10/06/2022   CREATININE 3.52 (H) 10/06/2022   GLUCOSE 140 (H) 10/06/2022   GFRNONAA 17 (L) 10/06/2022   GFRAA 51 (L) 10/30/2019   CALCIUM 8.1 (L) 10/06/2022   PHOS 6.2 (H) 10/06/2022   PROT 5.6 (L) 10/04/2022   ALBUMIN 2.1 (L) 10/06/2022   LABGLOB 3.2 11/11/2015   AGRATIO 1.3 11/11/2015   BILITOT 0.9 10/04/2022   ALKPHOS 85 10/04/2022   AST 21 10/04/2022   ALT 28 10/04/2022   ANIONGAP 13 10/06/2022    GFR: Estimated Creatinine Clearance: 20.3 mL/min (A) (by C-G formula based on SCr of 3.52 mg/dL (H)).  Recent Results (from the past 240 hour(s))  MRSA Next Gen by PCR, Nasal     Status: None   Collection Time: 10/02/22  4:35 AM   Specimen: Nasal Mucosa; Nasal Swab  Result Value Ref Range Status   MRSA by PCR Next Gen NOT DETECTED NOT DETECTED Final    Comment: (NOTE) The GeneXpert MRSA Assay (FDA approved for NASAL specimens only), is one component of a comprehensive MRSA colonization surveillance program. It is not intended to diagnose MRSA infection nor to guide or monitor treatment for MRSA infections. Test performance is not FDA approved in patients less than 51 years old. Performed at [facility], [address]  [place], [place] [address]   C Difficile Quick Screen w PCR reflex     Status: None   Collection Time: 10/02/22 12:05 PM   Specimen: STOOL  Result Value Ref Range Status   C Diff antigen NEGATIVE NEGATIVE Final   C Diff toxin NEGATIVE NEGATIVE Final   C Diff interpretation No C. difficile detected.  Final    Comment: Performed at [facility],  [address] [place], [place] [address]  Culture, blood (Routine X 2) w Reflex to ID Panel     Status: None (Preliminary result)   Collection Time: 10/03/22  4:04 PM   Specimen: BLOOD RIGHT HAND  Result Value Ref Range Status   Specimen Description   Final    BLOOD RIGHT HAND Performed at [facility], [address], [place], [place] [address]    Special Requests   Final    BOTTLES DRAWN AEROBIC AND ANAEROBIC Blood Culture adequate volume Performed at [facility], [address], [place], [place] [address]    Culture   Final    NO GROWTH 3 DAYS Performed at [facility], [address], [place], [place] [address]    Report Status PENDING  Incomplete  Culture, blood (Routine X 2) w Reflex to ID Panel     Status: None (Preliminary result)   Collection Time: 10/03/22  4:09 PM   Specimen: BLOOD RIGHT HAND  Result Value Ref Range Status   Specimen Description   Final    BLOOD RIGHT HAND Performed at [facility], [address], [place], [place] [address]    Special Requests   Final    BOTTLES DRAWN AEROBIC AND ANAEROBIC Blood Culture adequate volume Performed at [facility], [address], [place], [place] [address]    Culture   Final    NO GROWTH 3 DAYS Performed at [facility], [address], [place], [place] [address]    Report Status PENDING  Incomplete      Radiology Studies: CT HEAD WO CONTRAST ( )  Result Date: 10/05/2022 CLINICAL DATA:  Mental status change of unknown etiology. EXAM: CT HEAD WITHOUT CONTRAST TECHNIQUE: Contiguous axial images were obtained from the base of the skull through the vertex without intravenous contrast. RADIATION DOSE REDUCTION: This exam was performed according to the departmental dose-optimization program which includes automated exposure control, adjustment of the mA and/or kV according to patient size and/or use of iterative reconstruction technique. COMPARISON:   09/06/2022 FINDINGS: Brain: No evidence of acute infarction, hemorrhage, hydrocephalus, extra-axial collection or mass lesion/mass effect. Encephalomalacia is identified within the left cerebellar hemisphere, and left occipital lobe, which appears unchanged from previous exam. Multiple lacunar infarcts are noted within the right cerebellar hemisphere. Chronic bilateral basal ganglia and thalamic lacunar infarcts are also noted. Moderate diffuse periventricular and subcortical white matter hypodensity identified. Prominence of the sulci and ventricles compatible with brain atrophy. Vascular: No hyperdense vessel or unexpected calcification. Skull: Normal. Negative for fracture or focal lesion. Sinuses/Orbits: The paranasal sinuses and mastoid air cells are clear. Other: None IMPRESSION: 1. No acute intracranial abnormality. 2. Chronic infarcts within the left cerebellar hemisphere, left occipital lobe, and right cerebellar hemisphere. 3. Chronic bilateral basal ganglia and thalamic lacunar infarcts. 4. Moderate chronic microvascular ischemic changes and brain atrophy. Electronically Signed   By: Signa Kell M.D.   On: 10/05/2022 12:19      LOS: 4 days    Jacquelin Hawking, MD  [facility] 10/06/2022, 12:05 PM   If 7PM-7AM, please contact night-coverage [email or web address]

## 2022-10-07 ENCOUNTER — Inpatient Hospital Stay (HOSPITAL_COMMUNITY): Payer: Medicare Other

## 2022-10-07 DIAGNOSIS — N179 Acute kidney failure, unspecified: Secondary | ICD-10-CM | POA: Diagnosis not present

## 2022-10-07 DIAGNOSIS — G3183 Dementia with Lewy bodies: Secondary | ICD-10-CM | POA: Diagnosis not present

## 2022-10-07 DIAGNOSIS — L03311 Cellulitis of abdominal wall: Secondary | ICD-10-CM | POA: Diagnosis not present

## 2022-10-07 DIAGNOSIS — I10 Essential (primary) hypertension: Secondary | ICD-10-CM | POA: Diagnosis not present

## 2022-10-07 LAB — RENAL FUNCTION PANEL
Albumin: 2.2 g/dL — ABNORMAL LOW (ref 3.5–5.0)
Anion gap: 11 (ref 5–15)
BUN: 83 mg/dL — ABNORMAL HIGH (ref 8–23)
CO2: 21 mmol/L — ABNORMAL LOW (ref 22–32)
Calcium: 8 mg/dL — ABNORMAL LOW (ref 8.9–10.3)
Chloride: 112 mmol/L — ABNORMAL HIGH (ref 98–111)
Creatinine, Ser: 2.82 mg/dL — ABNORMAL HIGH (ref 0.61–1.24)
GFR, Estimated: 23 mL/min — ABNORMAL LOW (ref >60)
Glucose, Bld: 190 mg/dL — ABNORMAL HIGH (ref 70–99)
Phosphorus: 5.8 mg/dL — ABNORMAL HIGH (ref 2.5–4.6)
Potassium: 3.5 mmol/L (ref 3.5–5.1)
Sodium: 144 mmol/L (ref 135–145)

## 2022-10-07 LAB — CBC
HCT: 24.9 % — ABNORMAL LOW (ref 39.0–52.0)
Hemoglobin: 8.1 g/dL — ABNORMAL LOW (ref 13.0–17.0)
MCH: 28.7 pg (ref 26.0–34.0)
MCHC: 32.5 g/dL (ref 30.0–36.0)
MCV: 88.3 fL (ref 80.0–100.0)
Platelets: 444 10*3/uL — ABNORMAL HIGH (ref 150–400)
RBC: 2.82 MIL/uL — ABNORMAL LOW (ref 4.22–5.81)
RDW: 17.2 % — ABNORMAL HIGH (ref 11.5–15.5)
WBC: 7.4 10*3/uL (ref 4.0–10.5)
nRBC: 0 % (ref 0.0–0.2)

## 2022-10-07 LAB — GLUCOSE, CAPILLARY
Glucose-Capillary: 183 mg/dL — ABNORMAL HIGH (ref 70–99)
Glucose-Capillary: 158 mg/dL — ABNORMAL HIGH (ref 70–99)
Glucose-Capillary: 179 mg/dL — ABNORMAL HIGH (ref 70–99)
Glucose-Capillary: 181 mg/dL — ABNORMAL HIGH (ref 70–99)
Glucose-Capillary: 171 mg/dL — ABNORMAL HIGH (ref 70–99)
Glucose-Capillary: 152 mg/dL — ABNORMAL HIGH (ref 70–99)

## 2022-10-07 LAB — VANCOMYCIN, RANDOM: Vancomycin Rm: 19 ug/mL

## 2022-10-07 MED ORDER — AMLODIPINE BESYLATE 5 MG PO TABS
5.0000 mg | ORAL_TABLET | Freq: Every day | ORAL | Status: DC
  Administered 2022-10-07: 5 mg
  Filled 2022-10-07: qty 1

## 2022-10-07 MED ORDER — IOPAMIDOL (ISOVUE-300) INJECTION 61%
50.0000 mL | Freq: Once | INTRAVENOUS | Status: AC | PRN
  Administered 2022-10-07: 50 mL

## 2022-10-07 MED ORDER — FUROSEMIDE 10 MG/ML IJ SOLN
40.0000 mg | Freq: Two times a day (BID) | INTRAMUSCULAR | Status: DC
  Administered 2022-10-07 – 2022-10-09 (×4): 40 mg via INTRAVENOUS
  Filled 2022-10-07 (×4): qty 4

## 2022-10-07 NOTE — Progress Notes (Signed)
[facility] Progress Note   Subjective:   I/Os 0.57L / 1.9L UOP yesterday. G tube leaking so TF are off currently.. Afebrile.  He's sleepy this AM.   Had long conversation with daughter yesterday afternoon bedside.   Objective Vitals:   10/07/22 0727 10/07/22 0728 10/07/22 0800 10/07/22 0900  BP:   (!) 189/68 (!) 189/71  Pulse:  72 73 73  Resp:  (!) 23 15 16   Temp: (!) 97.5 F (36.4 C)     TempSrc: Axillary     SpO2:  98% 97% 95%  Weight:      Height:       Physical Exam General: chronically ill but calm Heart:RRR no rub Lungs: loose rhonchi BL, currently on RA sat 98% during exam Abdomen: soft, PEG with dressing overdressed due to leaking,  No TTP.  TF off Extremities:1-2+ diffuse edema hips and arms. Slightly improved but still a good bit. Neuro: sleepy  Additional Objective Labs: Basic Metabolic Panel: Recent Labs  Lab 10/05/22 0500 10/06/22 0830 10/07/22 0634  NA 137 141 144  K 3.8 3.6 3.5  CL 108 110 112*  CO2 17* 18* 21*  GLUCOSE 170* 140* 190*  BUN 96* 95* 83*  CREATININE 3.85* 3.52* 2.82*  CALCIUM 7.7* 8.1* 8.0*  PHOS 5.7* 6.2* 5.8*   Liver Function Tests: Recent Labs  Lab 10/02/22 1103 10/03/22 0359 10/04/22 0531 10/06/22 0830 10/07/22 0634  AST 24 27 21   --   --   ALT 31 33 28  --   --   ALKPHOS 78 73 85  --   --   BILITOT 1.1 1.0 0.9  --   --   PROT 6.5 5.9* 5.6*  --   --   ALBUMIN 2.4* 2.1* 2.0* 2.1* 2.2*   No results for input(s): "LIPASE", "AMYLASE" in the last 168 hours. CBC: Recent Labs  Lab 10/02/22 1103 10/03/22 0359 10/04/22 0531 10/05/22 0500 10/06/22 0830 10/07/22 0633  WBC 17.2* 20.1* 16.5* 10.0 7.7 7.4  NEUTROABS 13.9*  --   --   --   --   --   HGB 6.9* 7.6* 7.0* 7.2* 7.5* 8.1*  HCT 22.1* 23.2* 21.6* 22.3* 23.7* 24.9*  MCV 90.6 87.2 87.1 88.1 87.8 88.3  PLT 326 335 333 366 404* 444*   Blood Culture    Component Value Date/Time   SDES  10/03/2022 1609    BLOOD RIGHT HAND Performed at [facility], [address] [place], [place] [address]    SPECREQUEST  10/03/2022 1609    BOTTLES DRAWN AEROBIC AND ANAEROBIC Blood Culture adequate volume Performed at [facility], [address], [place], [place] [address]    CULT  10/03/2022 1609    NO GROWTH 4 DAYS Performed at [facility], [address], [place], [place] [address]    REPTSTATUS PENDING 10/03/2022 1609    Cardiac Enzymes: No results for input(s): "CKTOTAL", "CKMB", "CKMBINDEX", "TROPONINI" in the last 168 hours. CBG: Recent Labs  Lab 10/06/22 1539 10/06/22 1932 10/06/22 2351 10/07/22 0329 10/07/22 0726  GLUCAP 195* 187* 200* 183* 158*   Iron Studies:  Recent Labs    10/04/22 1857  IRON 21*  TIBC 176*  FERRITIN 126   @[email or web address] @ Studies/Results: CT HEAD WO CONTRAST ( )  Result Date: 10/05/2022 CLINICAL DATA:  Mental status change of unknown etiology. EXAM: CT HEAD WITHOUT CONTRAST TECHNIQUE: Contiguous axial images were obtained from the base of the skull through the vertex without intravenous  contrast. RADIATION DOSE REDUCTION: This exam was performed according to the departmental dose-optimization program which includes automated exposure control, adjustment of the mA and/or kV according to patient size and/or use of iterative reconstruction technique. COMPARISON:  09/06/2022 FINDINGS: Brain: No evidence of acute infarction, hemorrhage, hydrocephalus, extra-axial collection or mass lesion/mass effect. Encephalomalacia is identified within the left cerebellar hemisphere, and left occipital lobe, which appears unchanged from previous exam. Multiple lacunar infarcts are noted within the right cerebellar hemisphere. Chronic bilateral basal ganglia and thalamic lacunar infarcts are also noted. Moderate diffuse periventricular and subcortical white matter hypodensity identified. Prominence of the sulci and ventricles compatible with brain atrophy. Vascular: No hyperdense vessel or  unexpected calcification. Skull: Normal. Negative for fracture or focal lesion. Sinuses/Orbits: The paranasal sinuses and mastoid air cells are clear. Other: None IMPRESSION: 1. No acute intracranial abnormality. 2. Chronic infarcts within the left cerebellar hemisphere, left occipital lobe, and right cerebellar hemisphere. 3. Chronic bilateral basal ganglia and thalamic lacunar infarcts. 4. Moderate chronic microvascular ischemic changes and brain atrophy. Electronically Signed   By: Signa Kell M.D.   On: 10/05/2022 12:19   Medications:  ampicillin-sulbactam (UNASYN) IV Stopped (10/07/22 0600)   feeding supplement (JEVITY 1.5 CAL/FIBER) 1,000 mL (10/06/22 1742)    sodium chloride   Intravenous Once   alteplase  2 mg Intracatheter Once   carvedilol  12.5 mg Per Tube BID   Chlorhexidine Gluconate Cloth  6 each Topical Q0600   enoxaparin (LOVENOX) injection  30 mg Subcutaneous Q24H   ferrous sulfate  220 mg Per Tube Daily   furosemide  60 mg Intravenous Q12H   insulin aspart  0-9 Units Subcutaneous Q4H   ipratropium-albuterol  3 mL Nebulization TID   multivitamin  15 mL Per Tube Daily   pyridostigmine  30 mg Per Tube Q8H   rosuvastatin  5 mg Per Tube QHS   sertraline  25 mg Per Tube Daily   sodium chloride flush  10-40 mL Intracatheter Q12H   Assessment/Plan:   # AKI on CKD 3: baseline Cr in the mid 1s as recently as earlier this month.  Presented 7/30 with Cr 3.4, peaked 8/2 at 3.8 and improving today at 2.8 - Pre-renal insult of hypoalbuminemia and in setting of infection compounded by 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  Intake UA with 11-20 RBC and negative for protein - He has a foley in place - agree for now given significant scrotal edema and need for I/Os - Vanc/zosyn off - lasix 60 mg IV every 12 hours reduced to 40 IV q12h in setting of TF off at this time -cont to hold ARB -no current indications for dialysis   # Bacteremia - Granulicatella Adiacens  - Antibiotics per ID  consult - plan linezolid once vanc level down to complete course 8/19    # HTN  - BP running higher - off ARB for now.  On coreg.  Add amlodipine.  Avoid hypotension.   # Urinary retention - mild - on flomax - cont foley for now   # Hematuria - microscopic - may be in setting of foley/trauma  - repeat UA with same --> suspect foley related still, can recheck after foley removed   # Anemia normocytic - PRBC's per primary team discretion - got some yesterday per his daughter - Previously with severe iron deficiency anemia - iron sat 12% here -- > would dose with IV iron after bacteremia tx complete  #metabolic acidosis: mild, follow - it's improving  Will follow, please reach out with concerns.   Estill Bakes MD 10/07/2022, 10:38 AM  [facility] Pager: [phone]

## 2022-10-07 NOTE — Progress Notes (Signed)
PROGRESS NOTE    MUAAZ BRAU  TKZ:[id number] DOB: Oct 24, 1945 DOA: 10/02/2022 PCP: System, Provider Not In   Brief Narrative: Harry [name] is a 77 y.o. male with a history of bronchitis secondary to metapneumovirus, ischemic stroke, pyelonephritis, CKD stage IIIb, community-acquired pneumonia, hypertension, peripheral neuropathy, tobacco use, peripheral vascular disease, bacteremia currently on antibiotics.  Patient presented secondary to dehydration and associated AKI.  Concern for possible cellulitis at PEG site.  Patient's antibiotics were escalated to vancomycin and Zosyn.  Patient started on IV fluids for AKI. Antibiotics adjusted to Linezolid and Unasyn.   Assessment and Plan:  AKI on CKD stage IIIb Recent baseline creatinine of 1. Creatinine on admission of 3.41. Presumed secondary to dehydration but is complicated by Vancomycin for bacteremia treatment. Patient started on IV fluids. IV fluids discontinued and Lasix started. Creatinine improved to 2.82 this morning. -Daily BMP -Nephrology recommendations: Lasix  Cellulitis Location is the upper abdomen at PEG site.  Presumed source of infection.  Patient with associated leukocytosis.  Complicated by history of bacteremia.  PEG tube recently placed.  Patient also also has a history of leaking from PEG site which may have caused local irritation/dermatitis rather than true cellulitis.  Zosyn added to treatment to more broadly cover and transitioned to Unasyn per ID with transition to Augmentin on discharge to complete 10 days of total treatment (EOT 8/8).  Leukocytosis Presumed secondary to cellulitis, but patient also with concern for possible pneumonia on imaging.  Patient also appears to have difficulty with secretions which places aspiration at high risk for etiology.  Patient is on antibiotics for presumed cellulitis in addition to bacteremia. -Continue Zosyn for possible aspiration pneumonia  Lethargy Unclear etiology.  Possibly related to developing uremia symptoms or possibly waxing waning mentation related to underlying cognitive disease. ABG ordered and does not suggest a respiratory cause for presentation. CT head unremarkable for etiology.  Patient's mentation is worse in the morning. -Will limit sedating medications  History of granulicatella adiacens bacteremia Present on admission.  Patient is prescribed vancomycin IV with prior plan for treatment until 10/22/2022.  Vancomycin resumed on admission, renally dosed.  Daughter is adamant that patient has a line infection and requests removal of his internal jugular line. ID consulted and have adjusted antibiotics to an oral regimen of Linezolid to complete course, once Vancomycin level has trended down.  Leaking PEG tube Recently placed at [facility]. Unsure if patient is safe for oral intake which may have been initial indication for placement. CT abdomen/pelvis with proper placement seen.  Patient with recurrent leaking once tube feed rate titrated up. -Speech therapy recommendations: NPO except meds crushed with puree (if not using PEG); moderate aspiration risk -Hold tube feeds for at least 12 hours, will restart tube feeds at 20 mL/h and hold.  Will contact IR in the a.m. for evaluation of PEG tube  Dysphagia Patient evaluated by speech therapy with recommendation at this time for n.p.o.  Daughter is hoping that patient can continue working with speech therapy for possible advancement of his diet. -Continued speech therapy recommendations  Acute urinary retention Unclear etiology. Foley catheter placed on 7/31. Urinalysis unremarkable. -Continue foley catheter.  Will likely need urology follow-up as an outpatient.  Bilateral pleural effusions Moderate in size per CT read. In setting of anasarca. -Will watch respiratory status carefully  Anasarca Noted on CT scan with patient showing clinical evidence of generalized edema. Likely related to overall  nutrition status. -Lasix per nephrology  Acute on chronic  anemia Baseline hemoglobin of about 8. Hemoglobin down to 6.9 on admission. Patient transfused 1 unit of PRBC. Bilirubin normal. INR elevated at 1.4. LDH normal. Hemoglobin appears to have stabilized. -Transfuse for hemoglobin <7  Hyperlipidemia -Continue Crestor once able  Primary hypertension Uncontrolled. -Continue Coreg -Amlodipine restarted  Diabetes mellitus type 2 Semglee discontinued secondary to down-trending blood sugar and current lack of nutrition intake. -Continue sliding scale insulin  Hypokalemia -Potassium supplementation per tube as needed  Hemiplegia History of CVA Patient is on Plavix as an outpatient which is held secondary to anemia of unclear etiology  Lewy body dementia Noted. Patient seen and evaluated by palliative care medicine at last hospitalization. Will defer consult at this time.  Pressure injury Right heel, mid sacrum. Present on admission.  History of right upper extremity DVT Patient is currently not on therapeutic anticoagulation secondary to anemia of unclear etiology requiring blood transfusion.  Left arm swelling Per daughter, patient was diagnosed with a RUE DVT at [facility], not on anticoagulation secondary to bleeding risk. Will need to obtain records from [facility]. LUE extremity duplex negative for acute/chronic DVT or SVT.    DVT prophylaxis: Lovenox Code Status:   Code Status: Full Code Family Communication: Daughter via telephone (8/4) Disposition Plan: Discharge pending improvement of AKI, nephrology recommendations, and improvement in PEG tube leak.   Consultants:  Infectious disease Nephrology  Procedures: None  Antimicrobials: Vancomycin Zosyn   Subjective: Patient reports no problems this morning.  When examining his belly, patient does say that he has some discomfort.  Objective: BP (!) 189/68   Pulse 73   Temp (!) 97.5 F (36.4 C) (Axillary)    Resp 15   Ht 5\' 9"  (1.753 m)   Wt 91.8 kg   SpO2 97%   BMI 29.89 kg/m   Examination:  General exam: Appears calm and comfortable Respiratory system: Clear to auscultation. Respiratory effort normal. Cardiovascular system: S1 & S2 heard, RRR. Gastrointestinal system: Abdomen is nondistended, soft and generally mildly tender.  Normal bowel sounds heard. Central nervous system: Alert and oriented to self. Musculoskeletal: Generalized edema. No calf tenderness   Data Reviewed: I have personally reviewed following labs and imaging studies  CBC Lab Results  Component Value Date   WBC 7.4 10/07/2022   RBC 2.82 (L) 10/07/2022   HGB 8.1 (L) 10/07/2022   HCT 24.9 (L) 10/07/2022   MCV 88.3 10/07/2022   MCH 28.7 10/07/2022   PLT 444 (H) 10/07/2022   MCHC 32.5 10/07/2022   RDW 17.2 (H) 10/07/2022   LYMPHSABS 1.0 10/02/2022   MONOABS 2.0 (H) 10/02/2022   EOSABS 0.1 10/02/2022   BASOSABS 0.1 10/02/2022     Last metabolic panel Lab Results  Component Value Date   NA 144 10/07/2022   K 3.5 10/07/2022   CL 112 (H) 10/07/2022   CO2 21 (L) 10/07/2022   BUN 83 (H) 10/07/2022   CREATININE 2.82 (H) 10/07/2022   GLUCOSE 190 (H) 10/07/2022   GFRNONAA 23 (L) 10/07/2022   GFRAA 51 (L) 10/30/2019   CALCIUM 8.0 (L) 10/07/2022   PHOS 5.8 (H) 10/07/2022   PROT 5.6 (L) 10/04/2022   ALBUMIN 2.2 (L) 10/07/2022   LABGLOB 3.2 11/11/2015   AGRATIO 1.3 11/11/2015   BILITOT 0.9 10/04/2022   ALKPHOS 85 10/04/2022   AST 21 10/04/2022   ALT 28 10/04/2022   ANIONGAP 11 10/07/2022    GFR: Estimated Creatinine Clearance: 25.3 mL/min (A) (by C-G formula based on SCr of 2.82 mg/dL (H)).  Recent  Results (from the past 240 hour(s))  MRSA Next Gen by PCR, Nasal     Status: None   Collection Time: 10/02/22  4:35 AM   Specimen: Nasal Mucosa; Nasal Swab  Result Value Ref Range Status   MRSA by PCR Next Gen NOT DETECTED NOT DETECTED Final    Comment: (NOTE) The GeneXpert MRSA Assay (FDA approved  for NASAL specimens only), is one component of a comprehensive MRSA colonization surveillance program. It is not intended to diagnose MRSA infection nor to guide or monitor treatment for MRSA infections. Test performance is not FDA approved in patients less than 2 years old. Performed at [facility], [address] [place], [place] [address]   C Difficile Quick Screen w PCR reflex     Status: None   Collection Time: 10/02/22 12:05 PM   Specimen: STOOL  Result Value Ref Range Status   C Diff antigen NEGATIVE NEGATIVE Final   C Diff toxin NEGATIVE NEGATIVE Final   C Diff interpretation No C. difficile detected.  Final    Comment: Performed at [facility], [address], [place], [place] [address]  Culture, blood (Routine X 2) w Reflex to ID Panel     Status: None (Preliminary result)   Collection Time: 10/03/22  4:04 PM   Specimen: BLOOD RIGHT HAND  Result Value Ref Range Status   Specimen Description   Final    BLOOD RIGHT HAND Performed at [facility], [address] [place], [place] [address]    Special Requests   Final    BOTTLES DRAWN AEROBIC AND ANAEROBIC Blood Culture adequate volume Performed at [facility], [address], [place], [place] [address]    Culture   Final    NO GROWTH 4 DAYS Performed at [facility], [address] [place], [place] [address]    Report Status PENDING  Incomplete  Culture, blood (Routine X 2) w Reflex to ID Panel     Status: None (Preliminary result)   Collection Time: 10/03/22  4:09 PM   Specimen: BLOOD RIGHT HAND  Result Value Ref Range Status   Specimen Description   Final    BLOOD RIGHT HAND Performed at [facility], [address] [place], [place] [address]    Special Requests   Final    BOTTLES DRAWN AEROBIC AND ANAEROBIC Blood Culture adequate volume Performed at [facility], [address], [place], [place] [address]     Culture   Final    NO GROWTH 4 DAYS Performed at [facility], [address], [place], [place] [address]    Report Status PENDING  Incomplete      Radiology Studies: CT HEAD WO CONTRAST ( )  Result Date: 10/05/2022 CLINICAL DATA:  Mental status change of unknown etiology. EXAM: CT HEAD WITHOUT CONTRAST TECHNIQUE: Contiguous axial images were obtained from the base of the skull through the vertex without intravenous contrast. RADIATION DOSE REDUCTION: This exam was performed according to the departmental dose-optimization program which includes automated exposure control, adjustment of the mA and/or kV according to patient size and/or use of iterative reconstruction technique. COMPARISON:  09/06/2022 FINDINGS: Brain: No evidence of acute infarction, hemorrhage, hydrocephalus, extra-axial collection or mass lesion/mass effect. Encephalomalacia is identified within the left cerebellar hemisphere, and left occipital lobe, which appears unchanged from previous exam. Multiple lacunar infarcts are noted within the right cerebellar hemisphere. Chronic bilateral basal ganglia and thalamic lacunar infarcts  are also noted. Moderate diffuse periventricular and subcortical white matter hypodensity identified. Prominence of the sulci and ventricles compatible with brain atrophy. Vascular: No hyperdense vessel or unexpected calcification. Skull: Normal. Negative for fracture or focal lesion. Sinuses/Orbits: The paranasal sinuses and mastoid air cells are clear. Other: None IMPRESSION: 1. No acute intracranial abnormality. 2. Chronic infarcts within the left cerebellar hemisphere, left occipital lobe, and right cerebellar hemisphere. 3. Chronic bilateral basal ganglia and thalamic lacunar infarcts. 4. Moderate chronic microvascular ischemic changes and brain atrophy. Electronically Signed   By: Signa Kell M.D.   On: 10/05/2022 12:19      LOS: 5 days    Jacquelin Hawking, MD [facility] 10/07/2022,  8:56 AM   If 7PM-7AM, please contact night-coverage [email or web address]

## 2022-10-08 ENCOUNTER — Encounter (HOSPITAL_COMMUNITY): Payer: Self-pay | Admitting: Radiology

## 2022-10-08 ENCOUNTER — Inpatient Hospital Stay (HOSPITAL_COMMUNITY): Payer: Medicare Other

## 2022-10-08 DIAGNOSIS — I10 Essential (primary) hypertension: Secondary | ICD-10-CM | POA: Diagnosis not present

## 2022-10-08 DIAGNOSIS — G3183 Dementia with Lewy bodies: Secondary | ICD-10-CM | POA: Diagnosis not present

## 2022-10-08 DIAGNOSIS — L03311 Cellulitis of abdominal wall: Secondary | ICD-10-CM | POA: Diagnosis not present

## 2022-10-08 DIAGNOSIS — N179 Acute kidney failure, unspecified: Secondary | ICD-10-CM | POA: Diagnosis not present

## 2022-10-08 LAB — GLUCOSE, CAPILLARY
Glucose-Capillary: 123 mg/dL — ABNORMAL HIGH (ref 70–99)
Glucose-Capillary: 125 mg/dL — ABNORMAL HIGH (ref 70–99)
Glucose-Capillary: 133 mg/dL — ABNORMAL HIGH (ref 70–99)
Glucose-Capillary: 136 mg/dL — ABNORMAL HIGH (ref 70–99)
Glucose-Capillary: 145 mg/dL — ABNORMAL HIGH (ref 70–99)
Glucose-Capillary: 152 mg/dL — ABNORMAL HIGH (ref 70–99)

## 2022-10-08 LAB — VANCOMYCIN, RANDOM: Vancomycin Rm: 13 ug/mL

## 2022-10-08 MED ORDER — HYDROMORPHONE HCL 1 MG/ML IJ SOLN
0.5000 mg | INTRAMUSCULAR | Status: DC | PRN
  Administered 2022-10-08: 0.5 mg via INTRAVENOUS
  Filled 2022-10-08: qty 1

## 2022-10-08 MED ORDER — AMLODIPINE BESYLATE 10 MG PO TABS
10.0000 mg | ORAL_TABLET | Freq: Every day | ORAL | Status: DC
  Administered 2022-10-08 – 2022-10-16 (×9): 10 mg
  Filled 2022-10-08 (×9): qty 1

## 2022-10-08 MED ORDER — HYDRALAZINE HCL 20 MG/ML IJ SOLN
10.0000 mg | Freq: Four times a day (QID) | INTRAMUSCULAR | Status: DC | PRN
  Administered 2022-10-11 – 2022-10-14 (×3): 10 mg via INTRAVENOUS
  Filled 2022-10-08 (×3): qty 1

## 2022-10-08 MED ORDER — SODIUM CHLORIDE 0.9 % IV SOLN
3.0000 g | Freq: Four times a day (QID) | INTRAVENOUS | Status: AC
  Administered 2022-10-08 – 2022-10-11 (×14): 3 g via INTRAVENOUS
  Filled 2022-10-08 (×12): qty 8

## 2022-10-08 MED ORDER — CHLORHEXIDINE GLUCONATE CLOTH 2 % EX PADS
6.0000 | MEDICATED_PAD | Freq: Every day | CUTANEOUS | Status: DC
  Administered 2022-10-08 – 2022-10-15 (×8): 6 via TOPICAL

## 2022-10-08 MED ORDER — OXYCODONE HCL 5 MG/5ML PO SOLN
2.5000 mg | Freq: Four times a day (QID) | ORAL | Status: DC | PRN
  Administered 2022-10-08 – 2022-10-12 (×4): 5 mg
  Filled 2022-10-08 (×4): qty 5

## 2022-10-08 MED ORDER — LINEZOLID 600 MG PO TABS
600.0000 mg | ORAL_TABLET | Freq: Two times a day (BID) | ORAL | Status: DC
  Administered 2022-10-08 – 2022-10-16 (×16): 600 mg
  Filled 2022-10-08 (×18): qty 1

## 2022-10-08 MED ORDER — MEDIHONEY WOUND/BURN DRESSING EX PSTE
1.0000 | PASTE | Freq: Every day | CUTANEOUS | Status: DC
  Administered 2022-10-08 – 2022-10-16 (×9): 1 via TOPICAL
  Filled 2022-10-08: qty 44

## 2022-10-08 MED ORDER — IOHEXOL 9 MG/ML PO SOLN
ORAL | Status: AC
  Filled 2022-10-08: qty 1000

## 2022-10-08 MED ORDER — LINEZOLID 600 MG/300ML IV SOLN
600.0000 mg | Freq: Two times a day (BID) | INTRAVENOUS | Status: DC
  Administered 2022-10-08: 600 mg via INTRAVENOUS
  Filled 2022-10-08: qty 300

## 2022-10-08 MED ORDER — POTASSIUM CHLORIDE 20 MEQ PO PACK
40.0000 meq | PACK | Freq: Once | ORAL | Status: AC
  Administered 2022-10-08: 40 meq
  Filled 2022-10-08: qty 2

## 2022-10-08 MED ORDER — IOHEXOL 9 MG/ML PO SOLN
500.0000 mL | ORAL | Status: AC
  Administered 2022-10-08 (×2): 500 mL via ORAL

## 2022-10-08 NOTE — Progress Notes (Signed)
[name] Progress Note   Subjective:   UOP 2.8 L yest and 4.3 L today.  Creat down to 1.8 today.   Objective Vitals:   10/08/22 0946 10/08/22 1000 10/08/22 1100 10/08/22 1115  BP: (!) 185/73 (!) 187/74 (!) 188/78   Pulse: 73 73 77   Resp:  10 (!) 9   Temp:    (!) 97.4 F (36.3 C)  TempSrc:    Axillary  SpO2:  98% 94%   Weight:      Height:       Physical Exam General: chronically ill but calm Heart:RRR no rub Lungs: loose rhonchi BL, currently on RA sat 98% during exam Abdomen: soft, PEG with dressing overdressed due to leaking,  No TTP.  TF off Extremities: no edema on exam today Neuro: alert, follows simple commands  Assessment/Plan:   # AKI on CKD 3: baseline Cr in the mid 1s as recently as earlier this month.  Presented 7/30 with Cr 3.4, peaked 8/2 at 3.8 and improving today at 2.8 - Pre-renal insult of hypoalbuminemia and in setting of infection compounded by 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  Intake UA with 11-20 RBC and negative for protein. Vanc/zosyn off. Gave IV lasix the last 2-3 days w/ excellent response.  Today creat is improved down to 1.8 and edema has resolved after 7-8 L of UOP the last 3 days. Cont to hold ARB x 1-2 mos. Should cont to recover. Does not need renal f/u. Have dc'd IV lasix. Will sign off.   # Bacteremia - Granulicatella Adiacens  - Antibiotics per ID consult - plan linezolid once vanc level down to complete course 8/19    # HTN  - BP running higher - off ARB for now.  On coreg, added amlodipine.  Avoid hypotension.   # Urinary retention - mild - on flomax - can dc foley when ready  Harry Moselle  MD  [facility] 10/08/2022, 2:36 PM  Recent Labs  Lab 10/06/22 0830 10/07/22 0633 10/07/22 0634 10/08/22 0500  HGB 7.5* 8.1*  --   --   ALBUMIN 2.1*  --  2.2* 2.4*  CALCIUM 8.1*  --  8.0* 8.4*  PHOS 6.2*  --  5.8* 4.6  CREATININE 3.52*  --  2.82* 1.81*  K 3.6  --  3.5 3.3*    Inpatient medications:  sodium chloride    Intravenous Once   alteplase  2 mg Intracatheter Once   amLODipine  10 mg Per Tube Daily   carvedilol  12.5 mg Per Tube BID   Chlorhexidine Gluconate Cloth  6 each Topical Q2200   enoxaparin (LOVENOX) injection  30 mg Subcutaneous Q24H   ferrous sulfate  220 mg Per Tube Daily   furosemide  40 mg Intravenous Q12H   insulin aspart  0-9 Units Subcutaneous Q4H   ipratropium-albuterol  3 mL Nebulization TID   leptospermum manuka honey  1 Application Topical Daily   linezolid  600 mg Per Tube Q12H   multivitamin  15 mL Per Tube Daily   pyridostigmine  30 mg Per Tube Q8H   rosuvastatin  5 mg Per Tube QHS   sertraline  25 mg Per Tube Daily   sodium chloride flush  10-40 mL Intracatheter Q12H    ampicillin-sulbactam (UNASYN) IV Stopped (10/08/22 1047)   feeding supplement (JEVITY 1.5 CAL/FIBER) 1,000 mL (10/06/22 1742)   acetaminophen **OR** acetaminophen, albuterol, hydrALAZINE, HYDROmorphone (DILAUDID) injection, ondansetron **OR** ondansetron (ZOFRAN) IV, mouth rinse, oxyCODONE, polyvinyl alcohol, sodium chloride flush

## 2022-10-08 NOTE — TOC Progression Note (Signed)
Transition of Care [facility]) - Progression Note    Patient Details  Name: Harry [name] MRN: [id number] Date of Birth: April 12, 1945  [facility] CM/SW Contact  Lavenia Atlas, RN Phone Number: 10/08/2022, 1:38 PM  Clinical Narrative:   This RNCM spoke with Lawerance Cruel w/[facility] who confirmed patient can return as LTC patient.  TOC will continue to follow.    Expected Discharge Plan: Skilled Nursing Facility Barriers to Discharge: Continued Medical Work up  Expected Discharge Plan and Services In-house Referral: NA Discharge Planning Services: CM Consult Post Acute Care Choice: Skilled Nursing Facility Living arrangements for the past 2 months: Skilled Nursing Facility (LTAC: [facility])                 DME Arranged: N/A DME Agency: NA       HH Arranged: NA HH Agency: NA         Social Determinants of Health (SDOH) Interventions SDOH Screenings   Food Insecurity: No Food Insecurity (09/07/2022)  Housing: Patient Unable To Answer (09/07/2022)  Transportation Needs: No Transportation Needs (09/07/2022)  Utilities: Not At Risk (09/07/2022)  Depression (PHQ2-9): Low Risk  (03/01/2020)  Financial Resource Strain: Low Risk  (09/29/2018)  Physical Activity: Inactive (09/29/2018)  Tobacco Use: Medium Risk (10/04/2022)    Readmission Risk Interventions    10/08/2022    1:37 PM 10/05/2022   11:34 AM 09/17/2022   12:21 PM  Readmission Risk Prevention Plan  Transportation Screening Complete Complete Complete  PCP or Specialist Appt within 3-5 Days   Complete  HRI or Home Care Consult   Complete  Social Work Consult for Recovery Care Planning/Counseling   Complete  Palliative Care Screening   Not Applicable  Medication Review Oceanographer) Complete Complete Complete  PCP or Specialist appointment within 3-5 days of discharge Complete Complete   HRI or Home Care Consult  Complete   SW Recovery Care/Counseling Consult  Complete   Palliative Care Screening  Not Applicable    Skilled Nursing Facility  Complete

## 2022-10-08 NOTE — Progress Notes (Signed)
Speech Language Pathology Treatment: Dysphagia  Patient Details Name: Harry [name] MRN: [id number] DOB: 16-May-1945 Today's Date: 10/08/2022 Time: 0910-0930 SLP Time Calculation (min) (ACUTE ONLY): 20 min  Assessment / Plan / Recommendation Clinical Impression  Patient seen by SLP for skilled treatment focused on dysphagia goals. His daughter was present in the room during session. RN and SLP repositioned patient to be sitting upright as much as possible to maximize safety with PO trials. Patient was awake, alert but does appear fatigued/drowsy. When he speaks, he is very dysarthric, suspected to be from generalized weakness and fatigue. After oral care, SLP administered spoon sips of thin liquids (water), followed by spoon sips of nectar thick liquids. With all liquid boluses, patient with delayed oral transit, movement of lips and jaw as if he was masticating ("munch" not rotary chewing). Swallow initiation appeared delayed. With thin liquids, he exhibited a delayed cough with audible movement of secretions but not effective to mobilize into oral cavity. He did exhibit a delayed throat clear with nectar thick liquids, but did seem to tolerate better than thin liquids. SLP then discussed with daughter recommendation to do a repeat MBS to assess his swallow function and compare to MBS completed last month. She was in agreement with this. Plan for MBS this afternoon pending scheduling.    HPI HPI: Pt is a 77 yo male presenting 7/30 with dehydration and AKI. W/u concerning for cellulitis at PEG site. Pt recently d/c to [facility] 7/15 and PEG was placed there. Pt had MBS 09/10/22 with pharyngeal swallow functional but overall intake impacted by impaired oral manipulation and significant delays in the onset of pharyngeal swallow. Aspiration occurred x1 and did elicit a cough response. Pt clinically had more difficulty due at least in part to positioning, and Dys 1 diet with nectar thick liquids was recommended  while inpatient. PMH includes: Lewy body dementia, PNA, stroke, DMII, CKD 3A, anemia, HTN, HLD, syncope, PAD s/p angioplasty and stenting, depression, glaucoma      SLP Plan  Continue with current plan of care;MBS      Recommendations for follow up therapy are one component of a multi-disciplinary discharge planning process, led by the attending physician.  Recommendations may be updated based on patient status, additional functional criteria and insurance authorization.    Recommendations  Diet recommendations: NPO                  Oral care QID   Frequent or constant Supervision/Assistance       Continue with current plan of care;MBS    Angela Nevin, MA, CCC-SLP Speech Therapy

## 2022-10-08 NOTE — Procedures (Signed)
Modified Barium Swallow Study  Patient Details  Name: Harry [name] MRN: [id number] Date of Birth: 11/30/1945  Today's Date: 10/08/2022  Modified Barium Swallow completed.  Full report located under Chart Review in the Imaging Section.  History of Present Illness Pt is a 77 yo male presenting 7/30 with dehydration and AKI. W/u concerning for cellulitis at PEG site. Pt recently d/c to [facility] 7/15 and PEG was placed there. Pt had MBS 09/10/22 with pharyngeal swallow functional but overall intake impacted by impaired oral manipulation and significant delays in the onset of pharyngeal swallow. Aspiration occurred x1 and did elicit a cough response. Pt clinically had more difficulty due at least in part to positioning, and Dys 1 diet with nectar thick liquids was recommended while inpatient. PMH includes: Lewy body dementia, PNA, stroke, DMII, CKD 3A, anemia, HTN, HLD, syncope, PAD s/p angioplasty and stenting, depression, glaucoma.   Clinical Impression Patient presents with a moderately impaired oral and pharyngeal swallow as per this modified barium swallow study. During oral phase, patient with inefficient lingual transit of bolus from anterior to posterior portion of oral cavity. Delayed oral phase led to significantly delayed swallow initiation during pharyngeal phase, such that barium consistencies of thin liquids, nectar thick liquids and honey thick liquids all entered into pyriform sinus and remained there until swallow was initiated, which was at times, as long as 16 seconds. Swallow initiation with puree/pudding thick barium was delayed at level of vallecular sinus. During this study, there was only one instance of sensed aspiration (PAS 7) with nectar thick liquids, however this was a moderate amount and occured when nectar thick barium flowed over epiglottis (which was curled inward towards base of tongue) and directly into open airway. Patient did exhibit cough response but this was not timely  or productive enough to clear aspirate. Although no other instances of aspiration observed, patient is at high risk of this occuring with thin, nectar thick and honey thick secondary to the significant delay in swallow initiation that occured as well as the poorly timed epiglottic closure. No significant amount of oral or pharyngeal residuals were observed s/s swallows initiated. SLP to discuss GOC with MD and patient's daughter prior to making PO recommendations. Factors that may increase risk of adverse event in presence of aspiration [employer]ance Coots 2021): Reduced cognitive function;Limited mobility;Weak cough;Dependence for feeding and/or oral hygiene  Swallow Evaluation Recommendations Recommendations: Alternative means of nutrition - G Tube;NPO Postural changes: Position pt fully upright for meals Oral care recommendations: Oral care before ice chips/water;Oral care BID (2x/day) Caregiver Recommendations: Have oral suction available    Angela Nevin, MA, CCC-SLP Speech Therapy

## 2022-10-08 NOTE — Progress Notes (Signed)
Initial Nutrition Assessment  DOCUMENTATION CODES:   Not applicable  INTERVENTION:  - TF's on hold due to leaking. Plan for IR consult to eval tube.  - Once medically appropriate to restart tube feeds, would recommend: Jevity 1.5 at 60 ml/h  *Would recommend restarting back at 20 ml/hr and advancing by 10 ml every 12 hours Provides 2160 kcal, 92 gm protein, 1094 ml free water daily  - Recommend Q4H free water flushes or per MD.   -Multivitamin with minerals daily via tube  - Monitor weight trends.   - Will monitor for diet advancement, pending MBS.   NUTRITION DIAGNOSIS:   Increased nutrient needs related to wound healing as evidenced by estimated needs. *ongoing  GOAL:   Patient will meet greater than or equal to 90% of their needs *not met, TF on hold  MONITOR:   Labs, Weight trends, I & O's, TF tolerance, Skin  REASON FOR ASSESSMENT:   Consult Enteral/tube feeding initiation and management  ASSESSMENT:   77 y.o. male with medical history significant of bronchitis due to human metapneumovirus, history ischemic stroke, history of metabolic encephalopathy, pyelonephritis, AKI, community-acquired pneumonia, stage III CKD, hyperlipidemia, hypertension, microalbuminuria, peripheral neuropathy, history of pneumonia, history of sepsis, tobacco abuse, peripheral vascular disease, left eye vision loss who was recently discharged on 09/17/2022 to [facility] after hospitalization for AKI and bacteremia due to [facility] ADIACENS currently on vancomycin until 10/22/2022 per ID recommendations.  While he was at [facility], they placed a PEG.  Unfortunately, the PEG site seems to have developed cellulitis and the patient has become dehydrated and again developed acute kidney injury.  Patient sleeping at time of visit. RN reports tube feeds have been off since yesterday due to leaking. Prior to this last documented rate was 15mL/hr. Plan for IR consult for eval of PEG. CT  abdomen/pelvis also ordered.   Patient remains NPO per recommendations of SLP. Last eval this AM and still recommended NPO but plan for MBS (hopefully this afternoon pending scheduling).    Medications reviewed and include: 220mg  ferrous sulfate, MVI, Lasix  Labs reviewed:  Na 147 K+ 3.3 Creatinine 1.81 HA1C 6.5 Blood Glucose 136-181 x24 hours   Diet Order:   Diet Order             Diet NPO time specified  Diet effective now                   EDUCATION NEEDS:  Not appropriate for education at this time  Skin:  Skin Assessment: Reviewed RN Assessment Skin Integrity Issues:: DTI DTI: bilateral heels, stage 2 sacrum  Last BM:  8/5  Height:  Ht Readings from Last 1 Encounters:  10/02/22 5\' 9"  (1.753 m)   Weight:  Wt Readings from Last 1 Encounters:  10/08/22 79.2 kg   BMI:  Body mass index is 25.78 kg/m.  Estimated Nutritional Needs:  Kcal:  2100-2300 Protein:  105-115g Fluid:  2.1L/day    Shelle Iron RD, LDN For contact information, refer to [facility].

## 2022-10-08 NOTE — Progress Notes (Addendum)
Pharmacy Antibiotic Note  Harry [name] is a 77 y.o. male admitted on 10/02/2022. Pharmacy has been consulted for Vancomycin dosing.  Recent admission (7/4-7/15) with Granulicatella adiacens bacteremia with treatment plan for 6 weeks of IV vancomycin through 8/19.  Direct admit from [facility] on 7/29-7/30 due to fever, sepsis, renal failure, feeding tube redness/swelling/cellulitis. Given the patient's poor renal function during admission, renal MD recommended changing vancomycin to an alternative antibiotic. After discussion with ID, plan to transition from vancomycin therapy to linezolid once vancomycin is cleared from the patient's system.   Today, 10/08/2022: WBC decreased to 7.4 Afebrile SCr improved to 1.81 (baseline SCr 1.01 on 7/15) Recent Vanc doses: 7/28 Vanc 1500mg  at 2pm.  7/30 Vanc 1750mg  at 8am Vanc level 13 (low) this AM at 0700  Plan: Discontinue outpatient vancomycin therapy  Start linezolid 600mg  PO q12hrs Adjust Unasyn IV to 3g q6hrs   Height: 5\' 9"  (175.3 cm) Weight: 79.2 kg (174 lb 9.7 oz) IBW/kg (Calculated) : 70.7  Temp (24hrs), Avg:97.5 F (36.4 C), Min:97.1 F (36.2 C), Max:98 F (36.7 C)  Recent Labs  Lab 10/03/22 0359 10/04/22 0531 10/05/22 0500 10/06/22 0830 10/06/22 1113 10/07/22 0633 10/07/22 0634 10/07/22 0659 10/08/22 0500 10/08/22 0700  WBC 20.1* 16.5* 10.0 7.7  --  7.4  --   --   --   --   CREATININE 3.30* 3.44* 3.85* 3.52*  --   --  2.82*  --  1.81*  --   VANCORANDOM 41 35 28  --    < >  --   --  19  --  13   < > = values in this interval not displayed.    Estimated Creatinine Clearance: 35.3 mL/min (A) (by C-G formula based on SCr of 1.81 mg/dL (H)).    Allergies  Allergen Reactions   Lipitor [Atorvastatin] Other (See Comments)    Myalgias   Statins Other (See Comments)    Myalgias  Pt tolerating rosuvastatin 5mg  QD   Pravachol [Pravastatin] Rash    Antimicrobials this admission: PTA Vancomycin (7/4) >> 7/30 7/30  Zosyn >> 8/2 8/2 Unasyn >> 8/5 linezolid >>  Dose adjustments this admission: 7/31 Vanc random level:  41 (~ 20 hours after last dose) 8/1 Vanc random level: 35 8/2 Vanc random level: 28 8/3 Vanc random level: 22 8/4 Vanc random level: 19 8/5 Vanc random level: 13 8/5 Unasyn increased 3g q12hrs to q6hrs   Microbiology results: 7/30 MRSA PCR: not detected 7/30 CDiff: Ag neg, toxin neg 7/31 BCx: ngtd   Thank you for allowing pharmacy to be a part of this patient's care.  Cherylin Mylar, PharmD Clinical Pharmacist  8/5/20249:54 AM

## 2022-10-08 NOTE — Progress Notes (Signed)
PROGRESS NOTE    Harry [name]  AOZ:[id number] DOB: June 15, 1945 DOA: 10/02/2022 PCP: System, Provider Not In   Brief Narrative: Harry [name] is a 77 y.o. male with a history of bronchitis secondary to metapneumovirus, ischemic stroke, pyelonephritis, CKD stage IIIb, community-acquired pneumonia, hypertension, peripheral neuropathy, tobacco use, peripheral vascular disease, bacteremia currently on antibiotics.  Patient presented secondary to dehydration and associated AKI.  Concern for possible cellulitis at PEG site.  Patient's antibiotics were escalated to vancomycin and Zosyn.  Patient started on IV fluids for AKI. Antibiotics adjusted to Linezolid and Unasyn.   Assessment and Plan:  AKI on CKD stage IIIb Recent baseline creatinine of 1. Creatinine on admission of 3.41. Presumed secondary to dehydration but is complicated by Vancomycin for bacteremia treatment. Patient started on IV fluids. IV fluids discontinued and Lasix started. Creatinine improved to 1.81 this morning. -Daily BMP -Nephrology recommendations: Lasix  Cellulitis Location is the upper abdomen at PEG site.  Presumed source of infection.  Patient with associated leukocytosis.  Complicated by history of bacteremia.  PEG tube recently placed.  Patient also also has a history of leaking from PEG site which may have caused local irritation/dermatitis rather than true cellulitis.  Zosyn added to treatment to more broadly cover and transitioned to Unasyn per ID with transition to Augmentin on discharge to complete 10 days of total treatment (EOT 8/8).  Leukocytosis Presumed secondary to cellulitis, but patient also with concern for possible pneumonia on imaging.  Patient also appears to have difficulty with secretions which places aspiration at high risk for etiology.  Patient is on antibiotics for presumed cellulitis in addition to bacteremia. -Continue Unasyn for possible aspiration pneumonia  Lethargy Unclear etiology.  Possibly related to developing uremia symptoms or possibly waxing waning mentation related to underlying cognitive disease. ABG ordered and does not suggest a respiratory cause for presentation. CT head unremarkable for etiology.  Patient's mentation is worse in the morning. -Will limit sedating medications  Abdominal pain Appears to be worsening. X-ray unremarkable. -Repeat CT abdomen/pelvis  Hypernatremia Likely some degree of dehydration with Lasix. May need to start low rate Iv fluids but will defer to nephrology.  History of granulicatella adiacens bacteremia Present on admission.  Patient is prescribed vancomycin IV with prior plan for treatment until 10/22/2022.  Vancomycin resumed on admission, renally dosed.  Daughter is adamant that patient has a line infection and requests removal of his internal jugular line. ID consulted and have adjusted antibiotics to an oral regimen of Linezolid to complete course, once Vancomycin level has trended down; linezolid started. -Continue Linezolid  Leaking PEG tube Recently placed at [facility]. Unsure if patient is safe for oral intake which may have been initial indication for placement. CT abdomen/pelvis with proper placement seen.  Patient with recurrent leaking once tube feed rate titrated up. -Speech therapy recommendations: NPO except meds crushed with puree (if not using PEG); moderate aspiration risk -IR consult for evaluation -Wound nurse consult for wound care  Dysphagia Patient evaluated by speech therapy with recommendation at this time for n.p.o.  Daughter is hoping that patient can continue working with speech therapy for possible advancement of his diet. -Continued speech therapy recommendations  Acute urinary retention Unclear etiology. Foley catheter placed on 7/31. Urinalysis unremarkable. -Continue foley catheter.  Will likely need urology follow-up as an outpatient.  Bilateral pleural effusions Moderate in size per CT read. In  setting of anasarca. -Will watch respiratory status carefully  Anasarca Noted on CT scan with patient  showing clinical evidence of generalized edema. Likely related to overall nutrition status. -Lasix per nephrology  Acute on chronic anemia Baseline hemoglobin of about 8. Hemoglobin down to 6.9 on admission. Patient transfused 1 unit of PRBC. Bilirubin normal. INR elevated at 1.4. LDH normal. Hemoglobin appears to have stabilized. -Transfuse for hemoglobin <7  Hyperlipidemia -Continue Crestor once able  Primary hypertension Uncontrolled. -Continue Coreg, Amlodipine  Diabetes mellitus type 2 Semglee discontinued secondary to down-trending blood sugar and current lack of nutrition intake. -Continue sliding scale insulin  Hypokalemia -Potassium supplementation per tube as needed  Hemiplegia History of CVA Patient is on Plavix as an outpatient which is held secondary to anemia of unclear etiology  Lewy body dementia Noted. Patient seen and evaluated by palliative care medicine at last hospitalization. Will defer consult at this time.  Pressure injury Right heel, mid sacrum. Present on admission.  History of right upper extremity DVT Patient is currently not on therapeutic anticoagulation secondary to anemia of unclear etiology requiring blood transfusion.  Left arm swelling Per daughter, patient was diagnosed with a RUE DVT at [facility], not on anticoagulation secondary to bleeding risk. Will need to obtain records from [facility]. LUE extremity duplex negative for acute/chronic DVT or SVT. Will need to consider anticoagulation if hemoglobin remains stable.    DVT prophylaxis: Lovenox Code Status:   Code Status: Full Code Family Communication: Daughter via telephone (8/5) Disposition Plan: Discharge pending improvement of AKI, nephrology recommendations, and improvement in PEG tube leak.   Consultants:  Infectious  disease Nephrology  Procedures: None  Antimicrobials: Vancomycin Zosyn   Subjective: Patient reports no concerns this morning with chest pain or dyspnea. When examining, he does agree that he has abdominal pain.  Objective: BP (!) 197/90   Pulse 75   Temp (!) 97.4 F (36.3 C) (Axillary)   Resp 13   Ht 5\' 9"  (1.753 m)   Wt 79.2 kg   SpO2 96%   BMI 25.78 kg/m   Examination:  General exam: Appears calm and comfortable Respiratory system: Clear to auscultation. Respiratory effort normal. Cardiovascular system: S1 & S2 heard, RRR. No murmurs, rubs, gallops or clicks. Gastrointestinal system: Abdomen is nondistended, soft and generally tender. Normal bowel sounds heard. Central nervous system: Alert and oriented. No focal neurological deficits. Musculoskeletal: No edema. No calf tenderness Skin: No cyanosis. PEG site with rubor. Evidence of exudate around the tube in addition to ulceration noted with overlying exudate. Psychiatry: Judgement and insight appear normal. Mood & affect appropriate.    Data Reviewed: I have personally reviewed following labs and imaging studies  CBC Lab Results  Component Value Date   WBC 7.4 10/07/2022   RBC 2.82 (L) 10/07/2022   HGB 8.1 (L) 10/07/2022   HCT 24.9 (L) 10/07/2022   MCV 88.3 10/07/2022   MCH 28.7 10/07/2022   PLT 444 (H) 10/07/2022   MCHC 32.5 10/07/2022   RDW 17.2 (H) 10/07/2022   LYMPHSABS 1.0 10/02/2022   MONOABS 2.0 (H) 10/02/2022   EOSABS 0.1 10/02/2022   BASOSABS 0.1 10/02/2022     Last metabolic panel Lab Results  Component Value Date   NA 147 (H) 10/08/2022   K 3.3 (L) 10/08/2022   CL 112 (H) 10/08/2022   CO2 25 10/08/2022   BUN 56 (H) 10/08/2022   CREATININE 1.81 (H) 10/08/2022   GLUCOSE 140 (H) 10/08/2022   GFRNONAA 39 (L) 10/08/2022   GFRAA 51 (L) 10/30/2019   CALCIUM 8.4 (L) 10/08/2022   PHOS 4.6 10/08/2022  PROT 5.6 (L) 10/04/2022   ALBUMIN 2.4 (L) 10/08/2022   LABGLOB 3.2 11/11/2015    AGRATIO 1.3 11/11/2015   BILITOT 0.9 10/04/2022   ALKPHOS 85 10/04/2022   AST 21 10/04/2022   ALT 28 10/04/2022   ANIONGAP 10 10/08/2022    GFR: Estimated Creatinine Clearance: 35.3 mL/min (A) (by C-G formula based on SCr of 1.81 mg/dL (H)).  Recent Results (from the past 240 hour(s))  MRSA Next Gen by PCR, Nasal     Status: None   Collection Time: 10/02/22  4:35 AM   Specimen: Nasal Mucosa; Nasal Swab  Result Value Ref Range Status   MRSA by PCR Next Gen NOT DETECTED NOT DETECTED Final    Comment: (NOTE) The GeneXpert MRSA Assay (FDA approved for NASAL specimens only), is one component of a comprehensive MRSA colonization surveillance program. It is not intended to diagnose MRSA infection nor to guide or monitor treatment for MRSA infections. Test performance is not FDA approved in patients less than 24 years old. Performed at [facility], [address], [place], [place] [address]   C Difficile Quick Screen w PCR reflex     Status: None   Collection Time: 10/02/22 12:05 PM   Specimen: STOOL  Result Value Ref Range Status   C Diff antigen NEGATIVE NEGATIVE Final   C Diff toxin NEGATIVE NEGATIVE Final   C Diff interpretation No C. difficile detected.  Final    Comment: Performed at [facility], [address] [place], [place] [address]  Culture, blood (Routine X 2) w Reflex to ID Panel     Status: None (Preliminary result)   Collection Time: 10/03/22  4:04 PM   Specimen: BLOOD RIGHT HAND  Result Value Ref Range Status   Specimen Description   Final    BLOOD RIGHT HAND Performed at [facility], [address], [place], [place] [address]    Special Requests   Final    BOTTLES DRAWN AEROBIC AND ANAEROBIC Blood Culture adequate volume Performed at [facility], [address] [place], [place] [address]    Culture   Final    NO GROWTH 4 DAYS Performed at [facility], [address], [place],  [place] [address]    Report Status PENDING  Incomplete  Culture, blood (Routine X 2) w Reflex to ID Panel     Status: None (Preliminary result)   Collection Time: 10/03/22  4:09 PM   Specimen: BLOOD RIGHT HAND  Result Value Ref Range Status   Specimen Description   Final    BLOOD RIGHT HAND Performed at [facility], [address] [place], [place] [address]    Special Requests   Final    BOTTLES DRAWN AEROBIC AND ANAEROBIC Blood Culture adequate volume Performed at [facility], [address], [place], [place] [address]    Culture   Final    NO GROWTH 4 DAYS Performed at [facility], [address], [place], [place] [address]    Report Status PENDING  Incomplete      Radiology Studies: DG ABDOMEN PEG TUBE LOCATION  Result Date: 10/07/2022 CLINICAL DATA:  Leaking PEG tube. EXAM: ABDOMEN - 1 VIEW COMPARISON:  None Available. FINDINGS: KUB obtained after injection of 50 cc Omnipaque. Contrast opacifies the lumen of the stomach. No discernible contrast extravasation. Visualized abdomen demonstrates nonspecific bowel gas pattern. IMPRESSION: Injection of the gastrostomy tube opacifies the gastric lumen. No  discernible contrast extravasation. Electronically Signed   By: Kennith Center M.D.   On: 10/07/2022 13:18      LOS: 6 days    Jacquelin Hawking, MD [facility] 10/08/2022, 8:31 AM   If 7PM-7AM, please contact night-coverage [email or web address]

## 2022-10-08 NOTE — Consult Note (Addendum)
WOC Nurse Consult Note: Reason for Consult: Consult requested for G-tube site. Full thickness wound surrounding the insertion site on the abd is 100% yellow slough, dry without odor, drainage, or fluctuance. 1X2cm. G-tube is not leaking at this time.  Dressing procedure/placement/frequency: Topical treatment orders provided for bedside nurses to perform as follows to assist with removal of nonviable tissue: Apply Medihoney to abd around G tube Q day, then cover with split thickness gauze. Please re-consult if further assistance is needed.  Thank-you,  Cammie Mcgee MSN, RN, CWOCN, [place], CNS [phone]

## 2022-10-09 DIAGNOSIS — N179 Acute kidney failure, unspecified: Secondary | ICD-10-CM | POA: Diagnosis not present

## 2022-10-09 DIAGNOSIS — L03311 Cellulitis of abdominal wall: Secondary | ICD-10-CM | POA: Diagnosis not present

## 2022-10-09 DIAGNOSIS — G3183 Dementia with Lewy bodies: Secondary | ICD-10-CM | POA: Diagnosis not present

## 2022-10-09 DIAGNOSIS — I10 Essential (primary) hypertension: Secondary | ICD-10-CM | POA: Diagnosis not present

## 2022-10-09 LAB — GLUCOSE, CAPILLARY
Glucose-Capillary: 107 mg/dL — ABNORMAL HIGH (ref 70–99)
Glucose-Capillary: 116 mg/dL — ABNORMAL HIGH (ref 70–99)
Glucose-Capillary: 118 mg/dL — ABNORMAL HIGH (ref 70–99)
Glucose-Capillary: 120 mg/dL — ABNORMAL HIGH (ref 70–99)
Glucose-Capillary: 127 mg/dL — ABNORMAL HIGH (ref 70–99)
Glucose-Capillary: 154 mg/dL — ABNORMAL HIGH (ref 70–99)

## 2022-10-09 MED ORDER — ENOXAPARIN SODIUM 40 MG/0.4ML IJ SOSY
40.0000 mg | PREFILLED_SYRINGE | INTRAMUSCULAR | Status: DC
  Administered 2022-10-09 – 2022-10-15 (×7): 40 mg via SUBCUTANEOUS
  Filled 2022-10-09 (×7): qty 0.4

## 2022-10-09 NOTE — Progress Notes (Signed)
Speech Language Pathology Treatment: Dysphagia  Patient Details Name: Harry [name] MRN: [id number] DOB: 05/02/1945 Today's Date: 10/09/2022 Time: 2952-8413 SLP Time Calculation (min) (ACUTE ONLY): 15 min  Assessment / Plan / Recommendation Clinical Impression  Patient seen by SLP for skilled treatment focused on dysphagia goals. He was awake and alert when SLP arrived. After raising HOB to upright position, SLP performed oral care. PO's of pudding thick apple juice were given via spoon. As seen in the MBS previous date, patient with significantly delayed swallow initiation, with laryngeal pumping observed. No overt s/s aspiration observed and no change in voice or vitals. SLP continues to recommend NPO status as patient is at a very high risk of aspiration. If GOC changes to comfort care, SLP would recommend pudding thick liquids. SLP will continue to see patient for primarily education of family and GOC discussions.     HPI HPI: Pt is a 77 yo male presenting 7/30 with dehydration and AKI. W/u concerning for cellulitis at PEG site. Pt recently d/c to [facility] 7/15 and PEG was placed there. Pt had MBS 09/10/22 with pharyngeal swallow functional but overall intake impacted by impaired oral manipulation and significant delays in the onset of pharyngeal swallow. Aspiration occurred x1 and did elicit a cough response. Pt clinically had more difficulty due at least in part to positioning, and Dys 1 diet with nectar thick liquids was recommended while inpatient. PMH includes: Lewy body dementia, PNA, stroke, DMII, CKD 3A, anemia, HTN, HLD, syncope, PAD s/p angioplasty and stenting, depression, glaucoma.      SLP Plan  Continue with current plan of care      Recommendations for follow up therapy are one component of a multi-disciplinary discharge planning process, led by the attending physician.  Recommendations may be updated based on patient status, additional functional criteria and insurance  authorization.    Recommendations  Diet recommendations: NPO Medication Administration: Other (Comment) (crushed in puree if can't give through PEG)                      Frequent or constant Supervision/Assistance Dysphagia, oropharyngeal phase (R13.12)     Continue with current plan of care    Angela Nevin, MA, CCC-SLP Speech Therapy

## 2022-10-09 NOTE — Plan of Care (Signed)
  Problem: Clinical Measurements: Goal: Ability to maintain clinical measurements within normal limits will improve Outcome: Progressing Goal: Will remain free from infection Outcome: Progressing Goal: Diagnostic test results will improve Outcome: Progressing Goal: Respiratory complications will improve Outcome: Progressing Goal: Cardiovascular complication will be avoided Outcome: Progressing   Problem: Nutrition: Goal: Adequate nutrition will be maintained Outcome: Progressing   Problem: Coping: Goal: Level of anxiety will decrease Outcome: Progressing   Problem: Education: Goal: Knowledge of General Education information will improve Description Including pain rating scale, medication(s)/side effects and non-pharmacologic comfort measures Outcome: Not Progressing   Problem: Health Behavior/Discharge Planning: Goal: Ability to manage health-related needs will improve Outcome: Not Progressing   Problem: Activity: Goal: Risk for activity intolerance will decrease Outcome: Not Progressing
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
## 2022-10-10 ENCOUNTER — Inpatient Hospital Stay (HOSPITAL_COMMUNITY): Payer: Medicare Other

## 2022-10-10 DIAGNOSIS — N179 Acute kidney failure, unspecified: Secondary | ICD-10-CM | POA: Diagnosis not present

## 2022-10-10 HISTORY — PX: IR REPLACE G-TUBE SIMPLE WO FLUORO: IMG2323

## 2022-10-10 LAB — GLUCOSE, CAPILLARY
Glucose-Capillary: 119 mg/dL — ABNORMAL HIGH (ref 70–99)
Glucose-Capillary: 121 mg/dL — ABNORMAL HIGH (ref 70–99)
Glucose-Capillary: 125 mg/dL — ABNORMAL HIGH (ref 70–99)
Glucose-Capillary: 127 mg/dL — ABNORMAL HIGH (ref 70–99)
Glucose-Capillary: 130 mg/dL — ABNORMAL HIGH (ref 70–99)
Glucose-Capillary: 147 mg/dL — ABNORMAL HIGH (ref 70–99)

## 2022-10-10 MED ORDER — LIDOCAINE VISCOUS HCL 2 % MT SOLN
OROMUCOSAL | Status: AC
  Filled 2022-10-10: qty 15

## 2022-10-10 MED ORDER — POTASSIUM CHLORIDE 20 MEQ PO PACK
40.0000 meq | PACK | Freq: Once | ORAL | Status: AC
  Administered 2022-10-10: 40 meq
  Filled 2022-10-10: qty 2

## 2022-10-10 MED ORDER — IOHEXOL 300 MG/ML  SOLN
10.0000 mL | Freq: Once | INTRAMUSCULAR | Status: AC | PRN
  Administered 2022-10-10: 10 mL

## 2022-10-10 MED ORDER — IPRATROPIUM-ALBUTEROL 0.5-2.5 (3) MG/3ML IN SOLN
3.0000 mL | Freq: Two times a day (BID) | RESPIRATORY_TRACT | Status: DC
  Administered 2022-10-10 – 2022-10-11 (×3): 3 mL via RESPIRATORY_TRACT
  Filled 2022-10-10 (×3): qty 3

## 2022-10-10 MED ORDER — POTASSIUM CHLORIDE CRYS ER 20 MEQ PO TBCR: 40.0000 meq | EXTENDED_RELEASE_TABLET | Freq: Once | ORAL | Status: DC

## 2022-10-10 NOTE — Plan of Care (Signed)
  Problem: Education: Goal: Knowledge of General Education information will improve Description: Including pain rating scale, medication(s)/side effects and non-pharmacologic comfort measures Outcome: Not Progressing   

## 2022-10-10 NOTE — Progress Notes (Signed)
PROGRESS NOTE    Harry [name]  VWU:[id number] DOB: 1945/11/11 DOA: 10/02/2022 PCP: Crist Fat, MD   Brief Narrative: Harry [name] is a 77 y.o. male with a history of bronchitis secondary to metapneumovirus, ischemic stroke, pyelonephritis, CKD stage IIIb, community-acquired pneumonia, hypertension, peripheral neuropathy, tobacco use, peripheral vascular disease, bacteremia currently on antibiotics. Patient presented secondary to dehydration and associated AKI. Concern for possible cellulitis at PEG site. Patient's antibiotics adjusted to Linezolid and Unasyn.    Assessment and Plan:  AKI on CKD stage IIIb Improving Recent baseline creatinine of 1. Creatinine on admission of 3.41 Presumed secondary to dehydration but is complicated by Vancomycin for bacteremia treatment. Nephrology recommendations (8/5) to discontinue Lasix, hold ARB for 1 to 2 months, in addition to no requirement of outpatient renal follow-up.  Cellulitis Location is the upper abdomen at PEG site.  Presumed source of infection Complicated by history of bacteremia.  PEG tube recently placed Patient also also has a history of leaking from PEG site which may have caused local irritation/dermatitis rather than true cellulitis Continue Unasyn per ID with transition to Augmentin on discharge to complete 10 days of total treatment (EOT 8/8).  Aspiration pneumonia Possible diagnosis Patient also appears to have difficulty with secretions which places aspiration at high risk for etiology Continue Unasyn for possible aspiration pneumonia  Lethargy Unclear etiology Possibly related to waxing waning mentation related to underlying cognitive disease Vs infection ABG ordered and does not suggest a respiratory cause for presentation CT head unremarkable for etiology Will limit sedating medications  Abdominal pain Appears to be worsening. X-ray unremarkable Repeat CT abdomen/pelvis without further acute  findings  History of granulicatella adiacens bacteremia Present on admission ID consulted and have adjusted antibiotics to an oral regimen of Linezolid to complete course  Internal jugular line removed on 8/7 Continue Linezolid  Leaking PEG tube Recently placed at [facility]. Unsure if patient is safe for oral intake which may have been initial indication for placement CT abdomen/pelvis with proper placement seen Patient with recurrent leaking once tube feed rate titrated up.  IR evaluated and adjusted on 8/5.  WOC consulted with recommendations in chart. Speech therapy recommendations: NPO except meds crushed with puree (if not using PEG); moderate aspiration risk IR reconsulted on 8/7 due to poor tolerance of tube feeds, held Will attempt to restart tube feeds at 20 mL/h with titration of 10 mL/h every 12 hours to a goal rate of 60 mL/h; will need to watch for recurrent leaking  Dysphagia Patient evaluated by speech therapy with recommendation at this time for n.p.o Continued speech therapy recommendations  Acute urinary retention Unclear etiology. Foley catheter placed on 7/31. Urinalysis unremarkable. Continue foley catheter.  Will likely need urology follow-up as an outpatient.  Bilateral pleural effusions Moderate in size per CT read. In setting of anasarca.  Patient without respiratory symptoms at this time. Will watch respiratory status carefully  Anasarca Noted on CT scan with patient showing clinical evidence of generalized edema Likely related to overall nutrition status Patient was managed with Lasix per nephrology which is now discontinued.  Acute on chronic anemia Baseline hemoglobin of about 8. Hemoglobin down to 6.9 on admission. Patient transfused 1 unit of PRBC. Bilirubin normal. INR elevated at 1.4. LDH normal. Hemoglobin appears to have stabilized. Transfuse for hemoglobin <7  Hyperlipidemia Continue Crestor once able  Primary hypertension Uncontrolled. Continue  Coreg, Amlodipine  Diabetes mellitus type 2 Semglee discontinued secondary to down-trending blood sugar and current lack  of nutrition intake. Continue sliding scale insulin  Hypokalemia Potassium supplementation per tube as needed  Hemiplegia History of CVA Patient is on Plavix as an outpatient which is held secondary to anemia of unclear etiology  Lewy body dementia Noted. Patient seen and evaluated by palliative care medicine at last hospitalization Will consider consulting palliative again  Pressure injury Right heel, mid sacrum. Present on admission.  History of right upper extremity DVT Patient is currently not on therapeutic anticoagulation secondary to anemia of unclear etiology requiring blood transfusion.  Left arm swelling Per daughter, patient was diagnosed with a RUE DVT at [facility], not on anticoagulation secondary to bleeding risk LUE extremity duplex negative for acute/chronic DVT or SVT Consider anticoagulation if hemoglobin remains stable.    DVT prophylaxis: Lovenox Code Status:   Code Status: Full Code Family Communication: Son in Social worker at bedside. Disposition Plan: Discharge pending improvement in PEG tube leak and bed at long-term care facility per [facility]   Consultants:  Infectious disease Nephrology Interventional radiology  Procedures: None  Antimicrobials: Linezolid Unasyn   Subjective: Patient appears lethargic at this time, unable to have a conversation  Objective: BP (!) 152/67   Pulse 69   Temp (!) 97.5 F (36.4 C) (Oral)   Resp 14   Ht 5\' 9"  (1.753 m)   Wt 74.6 kg   SpO2 100%   BMI 24.29 kg/m   Examination: General: Lethargic Cardiovascular: S1, S2 present Respiratory: CTAB Abdomen: Soft, tender, nondistended, bowel sounds present Musculoskeletal: No bilateral pedal edema noted Skin: Normal Psychiatry: Unable to assess    Data Reviewed: I have personally reviewed following labs and imaging studies  CBC Lab Results   Component Value Date   WBC 8.6 10/10/2022   RBC 3.38 (L) 10/10/2022   HGB 9.6 (L) 10/10/2022   HCT 30.4 (L) 10/10/2022   MCV 89.9 10/10/2022   MCH 28.4 10/10/2022   PLT 534 (H) 10/10/2022   MCHC 31.6 10/10/2022   RDW 17.2 (H) 10/10/2022   LYMPHSABS 1.0 10/02/2022   MONOABS 2.0 (H) 10/02/2022   EOSABS 0.1 10/02/2022   BASOSABS 0.1 10/02/2022     Last metabolic panel Lab Results  Component Value Date   NA 145 10/10/2022   K 3.3 (L) 10/10/2022   CL 107 10/10/2022   CO2 28 10/10/2022   BUN 25 (H) 10/10/2022   CREATININE 1.37 (H) 10/10/2022   GLUCOSE 140 (H) 10/10/2022   GFRNONAA 54 (L) 10/10/2022   GFRAA 51 (L) 10/30/2019   CALCIUM 8.5 (L) 10/10/2022   PHOS 4.6 10/08/2022   PROT 6.3 (L) 10/09/2022   ALBUMIN 2.4 (L) 10/09/2022   LABGLOB 3.2 11/11/2015   AGRATIO 1.3 11/11/2015   BILITOT 0.7 10/09/2022   ALKPHOS 68 10/09/2022   AST 37 10/09/2022   ALT 35 10/09/2022   ANIONGAP 10 10/10/2022    GFR: Estimated Creatinine Clearance: 46.6 mL/min (A) (by C-G formula based on SCr of 1.37 mg/dL (H)).  Recent Results (from the past 240 hour(s))  MRSA Next Gen by PCR, Nasal     Status: None   Collection Time: 10/02/22  4:35 AM   Specimen: Nasal Mucosa; Nasal Swab  Result Value Ref Range Status   MRSA by PCR Next Gen NOT DETECTED NOT DETECTED Final    Comment: (NOTE) The GeneXpert MRSA Assay (FDA approved for NASAL specimens only), is one component of a comprehensive MRSA colonization surveillance program. It is not intended to diagnose MRSA infection nor to guide or monitor treatment for MRSA infections.  Test performance is not FDA approved in patients less than 13 years old. Performed at [facility], [address], [place], [place] [address]   C Difficile Quick Screen w PCR reflex     Status: None   Collection Time: 10/02/22 12:05 PM   Specimen: STOOL  Result Value Ref Range Status   C Diff antigen NEGATIVE NEGATIVE Final   C Diff toxin NEGATIVE  NEGATIVE Final   C Diff interpretation No C. difficile detected.  Final    Comment: Performed at [facility], [address] [place], [place] [address]  Culture, blood (Routine X 2) w Reflex to ID Panel     Status: None   Collection Time: 10/03/22  4:04 PM   Specimen: BLOOD RIGHT HAND  Result Value Ref Range Status   Specimen Description   Final    BLOOD RIGHT HAND Performed at [facility], [address] [place], [place] [address]    Special Requests   Final    BOTTLES DRAWN AEROBIC AND ANAEROBIC Blood Culture adequate volume Performed at [facility], [address], [place], [place] [address]    Culture   Final    NO GROWTH 5 DAYS Performed at [facility], [address], [place], [place] [address]    Report Status 10/08/2022 FINAL  Final  Culture, blood (Routine X 2) w Reflex to ID Panel     Status: None   Collection Time: 10/03/22  4:09 PM   Specimen: BLOOD RIGHT HAND  Result Value Ref Range Status   Specimen Description   Final    BLOOD RIGHT HAND Performed at [facility], [address] [place], [place] [address]    Special Requests   Final    BOTTLES DRAWN AEROBIC AND ANAEROBIC Blood Culture adequate volume Performed at [facility], [address] [place], [place] [address]    Culture   Final    NO GROWTH 5 DAYS Performed at [facility], [address] [place], [place] [address]    Report Status 10/08/2022 FINAL  Final      Radiology Studies: DG Abd Portable 1V  Result Date: 10/10/2022 CLINICAL DATA:  14941 Gastrostomy status (HCC) 14941 EXAM: PORTABLE ABDOMEN - 1 VIEW COMPARISON:  KUB, 10/07/2022.  CT AP, 10/08/2022. FINDINGS: Support lines: Gastrostomy tube, with tip positioned at the gastric antrum. Intraluminal contrast opacification of the stomach, demonstrating rugae. Intraluminal colonic opacification with contrast, likely previously ingested. Nonobstructed bowel-gas  pattern. No acute osseous abnormality. IMPRESSION: 1. Well-positioned gastrostomy tube, with tip of the gastric antrum. 2. Nonobstructed bowel-gas pattern. Electronically Signed   By: Roanna Banning M.D.   On: 10/10/2022 16:59      LOS: 8 days    Briant Cedar, MD [facility] 10/10/2022, 7:41 PM   If 7PM-7AM, please contact night-coverage [email or web address]

## 2022-10-10 NOTE — Plan of Care (Signed)
  Problem: Clinical Measurements: Goal: Respiratory complications will improve 10/10/2022 0735 by Sherri Rad, RN Outcome: Progressing 10/10/2022 0735 by Sherri Rad, RN Outcome: Progressing 10/10/2022 0735 by Sherri Rad, RN Outcome: Progressing   Problem: Education: Goal: Knowledge of General Education information will improve Description: Including pain rating scale, medication(s)/side effects and non-pharmacologic comfort measures 10/10/2022 0735 by Sherri Rad, RN Outcome: Progressing 10/10/2022 0735 by Sherri Rad, RN Outcome: Progressing 10/10/2022 0735 by Sherri Rad, RN Outcome: Progressing

## 2022-10-11 DIAGNOSIS — N179 Acute kidney failure, unspecified: Secondary | ICD-10-CM | POA: Diagnosis not present

## 2022-10-11 LAB — GLUCOSE, CAPILLARY
Glucose-Capillary: 147 mg/dL — ABNORMAL HIGH (ref 70–99)
Glucose-Capillary: 152 mg/dL — ABNORMAL HIGH (ref 70–99)
Glucose-Capillary: 155 mg/dL — ABNORMAL HIGH (ref 70–99)
Glucose-Capillary: 158 mg/dL — ABNORMAL HIGH (ref 70–99)
Glucose-Capillary: 188 mg/dL — ABNORMAL HIGH (ref 70–99)

## 2022-10-11 NOTE — Evaluation (Signed)
SLP Cancellation Note  Patient Details Name: KRAG ADAMOWICZ MRN: [id number] DOB: 12/09/1945   Cancelled treatment:       Reason Eval/Treat Not Completed: Other (comment) (pt continuing to have issues with secretions; recommend continue PEG and adequate oral care and follow up at next venue of care with SLP)   Chales Abrahams 10/11/2022, 11:27 AM  Rolena Infante, MS [facility] SLP Acute Rehab Services Office [phone]

## 2022-10-11 NOTE — Progress Notes (Addendum)
Nutrition Follow-up  DOCUMENTATION CODES:   Not applicable  INTERVENTION:  - Continue slowly advancing tube feeds towards goal as below: Jevity 1.5 at 60 ml/h  *Advancing by 10 ml every 12 hours Provides 2160 kcal, 92 gm protein, 1094 ml free water daily   - Recommend Q4H free water flushes or per MD.   -Multivitamin with minerals daily via tube   - Monitor weight trends.    - Will continue to monitor for diet advancement.   NUTRITION DIAGNOSIS:   Increased nutrient needs related to wound healing as evidenced by estimated needs. *ongoing  GOAL:   Patient will meet greater than or equal to 90% of their needs *progressing, on TF  MONITOR:   Labs, Weight trends, I & O's, TF tolerance, Skin  REASON FOR ASSESSMENT:   Consult Enteral/tube feeding initiation and management  ASSESSMENT:   77 y.o. male with medical history significant of bronchitis due to human metapneumovirus, history ischemic stroke, history of metabolic encephalopathy, pyelonephritis, AKI, community-acquired pneumonia, stage III CKD, hyperlipidemia, hypertension, microalbuminuria, peripheral neuropathy, history of pneumonia, history of sepsis, tobacco abuse, peripheral vascular disease, left eye vision loss who was recently discharged on 09/17/2022 to [facility] after hospitalization for AKI and bacteremia due to [facility] ADIACENS currently on vancomycin until 10/22/2022 per ID recommendations.  While he was at [facility], they placed a PEG.  Unfortunately, the PEG site seems to have developed cellulitis and the patient has become dehydrated and again developed acute kidney injury.  Tube feeds restarted 8/6 at 37mL/hr. Since that time tube feeds have been slowly advancing by 10mL every 12 hours towards goal. Patient is s/p G tubr exchange on 8/7 by IR. TF Running at 33mL/hr at time of visit this AM. No documented issues with tolerance or excessive leaking. Patient sleeping at time of visit.  He  continues to be followed by SLP with recommendations to remain NPO.   Medications reviewed and include: 220mg  ferrous sulfate, MVI  Labs reviewed:  Na 148 Creatinine 1.32 HA1C 6.5 Blood Glucose 119-152 x24 hours   Diet Order:   Diet Order             Diet NPO time specified  Diet effective now                   EDUCATION NEEDS:  Not appropriate for education at this time  Skin:  Skin Assessment: Reviewed RN Assessment Skin Integrity Issues:: DTI DTI: bilateral heels, stage 2 sacrum  Last BM:  8/8  Height:  Ht Readings from Last 1 Encounters:  10/02/22 5\' 9"  (1.753 m)   Weight:  Wt Readings from Last 1 Encounters:  10/11/22 76.8 kg    BMI:  Body mass index is 25 kg/m.  Estimated Nutritional Needs:  Kcal:  2100-2300 Protein:  105-115g Fluid:  2.1L/day    Shelle Iron RD, LDN For contact information, refer to [facility].

## 2022-10-11 NOTE — Progress Notes (Signed)
PROGRESS NOTE    Harry [name]  YNW:[id number] DOB: Aug 30, 1945 DOA: 10/02/2022 PCP: Crist Fat, MD   Brief Narrative: Harry [name] is a 77 y.o. male with a history of bronchitis secondary to metapneumovirus, ischemic stroke, pyelonephritis, CKD stage IIIb, community-acquired pneumonia, hypertension, peripheral neuropathy, tobacco use, peripheral vascular disease, bacteremia currently on antibiotics. Patient presented secondary to dehydration and associated AKI. Concern for possible cellulitis at PEG site. Patient's antibiotics adjusted to Linezolid and Unasyn.    Assessment and Plan:  AKI on CKD stage IIIb Improving Recent baseline creatinine of 1. Creatinine on admission of 3.41 Presumed secondary to dehydration but is complicated by Vancomycin for bacteremia treatment. Nephrology recommendations (8/5) to discontinue Lasix, hold ARB for 1 to 2 months, in addition to no requirement of outpatient renal follow-up.  Cellulitis Location is the upper abdomen at PEG site.  Presumed source of infection Complicated by history of bacteremia.  PEG tube recently placed Patient also also has a history of leaking from PEG site which may have caused local irritation/dermatitis rather than true cellulitis Continue Unasyn per ID with transition to Augmentin on discharge to complete 10 days of total treatment (EOT 8/8).  Aspiration pneumonia Possible diagnosis Patient also appears to have difficulty with secretions which places aspiration at high risk for etiology Continue Unasyn for possible aspiration pneumonia  Lethargy Unclear etiology Possibly related to waxing waning mentation related to underlying cognitive disease Vs infection ABG ordered and does not suggest a respiratory cause for presentation CT head unremarkable for etiology Will limit sedating medications  Abdominal pain Appears to be worsening. X-ray unremarkable Repeat CT abdomen/pelvis without further acute  findings  History of granulicatella adiacens bacteremia Present on admission ID consulted and have adjusted antibiotics to an oral regimen of Linezolid to complete course  Internal jugular line removed on 8/7 Continue Linezolid  Leaking PEG tube Recently placed at [facility]. Unsure if patient is safe for oral intake which may have been initial indication for placement CT abdomen/pelvis with proper placement seen Patient with recurrent leaking once tube feed rate titrated up Speech therapy recommendations: NPO except meds crushed with puree (if not using PEG); moderate aspiration risk IR reconsulted on 8/7 due to malfunction of PEG, s/p G-tube eval and exchange which was successful  Tube feeds restarted on 8/7, monitor closely   Dysphagia Patient evaluated by speech therapy with recommendation at this time for n.p.o Continued speech therapy recommendations  Acute urinary retention Unclear etiology. Foley catheter placed on 7/31. Urinalysis unremarkable. Continue foley catheter.  Will likely need urology follow-up as an outpatient.  Bilateral pleural effusions Moderate in size per CT read. In setting of anasarca.  Patient without respiratory symptoms at this time. Will watch respiratory status carefully  Anasarca Noted on CT scan with patient showing clinical evidence of generalized edema Likely related to overall nutrition status Patient was managed with Lasix per nephrology which is now discontinued.  Acute on chronic anemia Baseline hemoglobin of about 8. Hemoglobin down to 6.9 on admission. Patient transfused 1 unit of PRBC. Bilirubin normal. INR elevated at 1.4. LDH normal. Hemoglobin appears to have stabilized. Transfuse for hemoglobin <7  Hyperlipidemia Continue Crestor once able  Primary hypertension Uncontrolled. Continue Coreg, Amlodipine  Diabetes mellitus type 2 Semglee discontinued secondary to down-trending blood sugar and current lack of nutrition intake. Continue  sliding scale insulin  Hypokalemia Potassium supplementation per tube as needed  Hemiplegia History of CVA Patient is on Plavix as an outpatient which is held secondary  to anemia of unclear etiology  Lewy body dementia Noted. Patient seen and evaluated by palliative care medicine at last hospitalization Will consider consulting palliative again  Pressure injury Right heel, mid sacrum. Present on admission.  History of right upper extremity DVT Patient is currently not on therapeutic anticoagulation secondary to anemia of unclear etiology requiring blood transfusion.  Left arm swelling Per daughter, patient was diagnosed with a RUE DVT at [facility], not on anticoagulation secondary to bleeding risk LUE extremity duplex negative for acute/chronic DVT or SVT Consider anticoagulation if hemoglobin remains stable.    DVT prophylaxis: Lovenox Code Status:   Code Status: Full Code Family Communication: None at bedside. Disposition Plan: Discharge pending bed at long-term care facility per [facility]   Consultants:  Infectious disease Nephrology Interventional radiology  Procedures: None  Antimicrobials: Linezolid Unasyn   Subjective: Patient more awake and interactive today. Denies any new complaints.  Objective: BP (!) 165/65 (BP Location: Left Arm)   Pulse 68   Temp 97.6 F (36.4 C) (Oral)   Resp 12   Ht 5\' 9"  (1.753 m)   Wt 76.8 kg   SpO2 98%   BMI 25.00 kg/m   Examination: General: NAD  Cardiovascular: S1, S2 present Respiratory: CTAB Abdomen: Soft, tender, nondistended, bowel sounds present Musculoskeletal: No bilateral pedal edema noted Skin: Normal Psychiatry: Unable to assess    Data Reviewed: I have personally reviewed following labs and imaging studies  CBC Lab Results  Component Value Date   WBC 6.9 10/11/2022   RBC 3.30 (L) 10/11/2022   HGB 9.0 (L) 10/11/2022   HCT 29.6 (L) 10/11/2022   MCV 89.7 10/11/2022   MCH 27.3 10/11/2022   PLT 377  10/11/2022   MCHC 30.4 10/11/2022   RDW 17.2 (H) 10/11/2022   LYMPHSABS 1.3 10/11/2022   MONOABS 0.6 10/11/2022   EOSABS 0.2 10/11/2022   BASOSABS 0.1 10/11/2022     Last metabolic panel Lab Results  Component Value Date   NA 148 (H) 10/11/2022   K 3.7 10/11/2022   CL 112 (H) 10/11/2022   CO2 26 10/11/2022   BUN 19 10/11/2022   CREATININE 1.32 (H) 10/11/2022   GLUCOSE 164 (H) 10/11/2022   GFRNONAA 56 (L) 10/11/2022   GFRAA 51 (L) 10/30/2019   CALCIUM 8.3 (L) 10/11/2022   PHOS 4.6 10/08/2022   PROT 6.3 (L) 10/09/2022   ALBUMIN 2.4 (L) 10/09/2022   LABGLOB 3.2 11/11/2015   AGRATIO 1.3 11/11/2015   BILITOT 0.7 10/09/2022   ALKPHOS 68 10/09/2022   AST 37 10/09/2022   ALT 35 10/09/2022   ANIONGAP 10 10/11/2022    GFR: Estimated Creatinine Clearance: 48.4 mL/min (A) (by C-G formula based on SCr of 1.32 mg/dL (H)).  Recent Results (from the past 240 hour(s))  MRSA Next Gen by PCR, Nasal     Status: None   Collection Time: 10/02/22  4:35 AM   Specimen: Nasal Mucosa; Nasal Swab  Result Value Ref Range Status   MRSA by PCR Next Gen NOT DETECTED NOT DETECTED Final    Comment: (NOTE) The GeneXpert MRSA Assay (FDA approved for NASAL specimens only), is one component of a comprehensive MRSA colonization surveillance program. It is not intended to diagnose MRSA infection nor to guide or monitor treatment for MRSA infections. Test performance is not FDA approved in patients less than 67 years old. Performed at [facility], [address], [place], [place] [address]   C Difficile Quick Screen w PCR reflex  Status: None   Collection Time: 10/02/22 12:05 PM   Specimen: STOOL  Result Value Ref Range Status   C Diff antigen NEGATIVE NEGATIVE Final   C Diff toxin NEGATIVE NEGATIVE Final   C Diff interpretation No C. difficile detected.  Final    Comment: Performed at [facility], [address], [place], [place] [address]  Culture,  blood (Routine X 2) w Reflex to ID Panel     Status: None   Collection Time: 10/03/22  4:04 PM   Specimen: BLOOD RIGHT HAND  Result Value Ref Range Status   Specimen Description   Final    BLOOD RIGHT HAND Performed at [facility], [address] [place], [place] [address]    Special Requests   Final    BOTTLES DRAWN AEROBIC AND ANAEROBIC Blood Culture adequate volume Performed at [facility], [address], [place], [place] [address]    Culture   Final    NO GROWTH 5 DAYS Performed at [facility], [address] [place], [place] [address]    Report Status 10/08/2022 FINAL  Final  Culture, blood (Routine X 2) w Reflex to ID Panel     Status: None   Collection Time: 10/03/22  4:09 PM   Specimen: BLOOD RIGHT HAND  Result Value Ref Range Status   Specimen Description   Final    BLOOD RIGHT HAND Performed at [facility], [address], [place], [place] [address]    Special Requests   Final    BOTTLES DRAWN AEROBIC AND ANAEROBIC Blood Culture adequate volume Performed at [facility], [address] [place], [place] [address]    Culture   Final    NO GROWTH 5 DAYS Performed at [facility], [address] [place], [place] [address]    Report Status 10/08/2022 FINAL  Final      Radiology Studies: IR REPLACE G-TUBE SIMPLE WO FLUORO  Result Date: 10/11/2022 INDICATION: assess G tube site EXAM: EVALUATION AND EXCHANGE OF GASTROSTOMY TUBE COMPARISON:  CT AP, 10/08/2022. MEDICATIONS: None. CONTRAST:  10 mL Isovue-300-administered into the gastric lumen FLUOROSCOPY TIME:  None COMPLICATIONS: None immediate. PROCEDURE: Informed written consent was obtained from the patient and/or patient's representative after a discussion of the risks, benefits and alternatives to treatment. Questions regarding the procedure were encouraged and answered. A timeout was performed prior to the initiation of the procedure. The upper  abdomen and external portion of the existing gastrostomy tube was prepped and draped in the usual sterile fashion, and a sterile drape was applied covering the operative field. Maximum barrier sterile technique with sterile gowns and gloves were used for the procedure. A timeout was performed prior to the initiation of the procedure. The existing gastrostomy tube was removed and exchanged for a new 24 Fr balloon inflatable gastrostomy tube. The balloon was inflated with saline and dilute contrast and pulled against the anterior inner lumen of the stomach and the external disc was cinched. Contrast was injected and a post procedural spot fluoroscopic image was obtained confirming appropriate positioning and functionality of the new gastrostomy tube. A dressing was applied. The patient tolerated the procedure well without immediate postprocedural complication. IMPRESSION: Successful replacement and upsize for a new 24 Fr balloon-retention gastrostomy tube. The gastrostomy tube is ready for immediate use. PLAN: Patient will return to Vascular Interventional Radiology (VIR) for routine gastrostomy tube evaluation and exchange in 6 months. Roanna Banning, MD [facility] Radiology Electronically Signed   By: Roanna Banning M.D.   On: 10/11/2022 14:28   DG Abd Portable 1V  Result Date: 10/10/2022 CLINICAL DATA:  14941 Gastrostomy status (HCC) 14941 EXAM: PORTABLE ABDOMEN - 1 VIEW COMPARISON:  KUB, 10/07/2022.  CT AP, 10/08/2022. FINDINGS: Support lines: Gastrostomy tube, with tip positioned at the gastric antrum. Intraluminal contrast opacification of the stomach, demonstrating rugae. Intraluminal colonic opacification with contrast, likely previously ingested. Nonobstructed bowel-gas pattern. No acute osseous abnormality. IMPRESSION: 1. Well-positioned gastrostomy tube, with tip of the gastric antrum. 2. Nonobstructed bowel-gas pattern. Electronically Signed   By: Roanna Banning M.D.   On: 10/10/2022 16:59      LOS: 9 days    Briant Cedar, MD [facility] 10/11/2022, 5:35 PM   If 7PM-7AM, please contact night-coverage [email or web address]

## 2022-10-11 NOTE — Procedures (Signed)
Vascular and Interventional Radiology Procedure Note  Patient: Harry [name] DOB: [birth year] Medical Record Number: [id number] Note Date/Time: 10/10/22  Performing Physician: Roanna Banning, MD Assistant(s): None  Diagnosis: Catheter malfunction. and Leaking catheter.  Procedure: GASTROSTOMY TUBE EVALUATION and EXCHANGE  Anesthesia: Local Anesthetic Complications: None Estimated Blood Loss:  0 mL  Findings:  Successful exchange for a 40F balloon-retention gastrostomy tube under fluoroscopy.  Plan: Gastrostomy tube is ready for use. Recommend routine tube exchange in 6 months.   See detailed procedure note with images in PACS. The patient tolerated the procedure well without incident or complication and was returned to ICU in stable condition.    Roanna Banning, MD [facility] Radiology   Pager. [phone] Clinic. [phone]

## 2022-10-12 ENCOUNTER — Inpatient Hospital Stay: Payer: Medicare Other | Admitting: Internal Medicine

## 2022-10-12 DIAGNOSIS — N179 Acute kidney failure, unspecified: Secondary | ICD-10-CM | POA: Diagnosis not present

## 2022-10-12 LAB — GLUCOSE, CAPILLARY
Glucose-Capillary: 170 mg/dL — ABNORMAL HIGH (ref 70–99)
Glucose-Capillary: 167 mg/dL — ABNORMAL HIGH (ref 70–99)
Glucose-Capillary: 178 mg/dL — ABNORMAL HIGH (ref 70–99)

## 2022-10-12 MED ORDER — FREE WATER
200.0000 mL | Status: DC
  Administered 2022-10-12 – 2022-10-16 (×16): 200 mL

## 2022-10-12 MED ORDER — SODIUM CHLORIDE 0.9 % IV SOLN: INTRAVENOUS | Status: DC | PRN

## 2022-10-12 MED ORDER — OXYCODONE HCL 5 MG/5ML PO SOLN
2.5000 mg | Freq: Four times a day (QID) | ORAL | Status: DC | PRN
  Administered 2022-10-13 – 2022-10-15 (×3): 2.5 mg
  Filled 2022-10-12 (×3): qty 5

## 2022-10-12 MED ORDER — HYDRALAZINE HCL 10 MG PO TABS
10.0000 mg | ORAL_TABLET | Freq: Three times a day (TID) | ORAL | Status: DC
  Administered 2022-10-12 (×2): 10 mg
  Filled 2022-10-12 (×2): qty 1

## 2022-10-12 MED ORDER — HYDRALAZINE HCL 10 MG PO TABS
10.0000 mg | ORAL_TABLET | Freq: Two times a day (BID) | ORAL | Status: DC
  Administered 2022-10-12 – 2022-10-13 (×3): 10 mg
  Filled 2022-10-12 (×3): qty 1

## 2022-10-12 NOTE — Plan of Care (Signed)
  Problem: Clinical Measurements: Goal: Will remain free from infection Outcome: Progressing   Problem: Nutrition: Goal: Adequate nutrition will be maintained Outcome: Progressing   Problem: Elimination: Goal: Will not experience complications related to urinary retention Outcome: Progressing

## 2022-10-12 NOTE — Progress Notes (Addendum)
PROGRESS NOTE    Harry [name]  ZOX:[id number] DOB: 1945-07-26 DOA: 10/02/2022 PCP: Crist Fat, MD   Brief Narrative: Harry [name] is a 77 y.o. male with a history of bronchitis secondary to metapneumovirus, ischemic stroke, pyelonephritis, CKD stage IIIb, community-acquired pneumonia, hypertension, peripheral neuropathy, tobacco use, peripheral vascular disease, bacteremia currently on antibiotics. Patient presented secondary to dehydration and associated AKI. Concern for possible cellulitis at PEG site. Patient's antibiotics adjusted to Linezolid and Unasyn.    Assessment and Plan:  AKI on CKD stage IIIb Improving Recent baseline creatinine of 1. Creatinine on admission of 3.41 Presumed secondary to dehydration but is complicated by Vancomycin for bacteremia treatment. Nephrology recommendations (8/5) to discontinue Lasix, hold ARB for 1 to 2 months, in addition to no requirement of outpatient renal follow-up.  Cellulitis Location is the upper abdomen at PEG site.  Presumed source of infection Complicated by history of bacteremia.  PEG tube recently placed Patient also also has a history of leaking from PEG site which may have caused local irritation/dermatitis rather than true cellulitis Completed Augmentin on 8/8 for a total of 10 days  Aspiration pneumonia Possible diagnosis Patient also appears to have difficulty with secretions which places aspiration at high risk for etiology Completed Augmentin on 8/8 for a total of 10 days  Lethargy Unclear etiology Possibly related to waxing waning mentation related to underlying cognitive disease Vs infection ABG ordered and does not suggest a respiratory cause for presentation CT head unremarkable for etiology Will limit sedating medications  Abdominal pain Appears to be worsening. X-ray unremarkable Repeat CT abdomen/pelvis without further acute findings  History of granulicatella adiacens bacteremia Present on  admission ID consulted and have adjusted antibiotics to an oral regimen of Linezolid to complete course  Internal jugular line removed on 8/7 Continue Linezolid  Leaking PEG tube- Resolved Recently placed at [facility]. Unsure if patient is safe for oral intake which may have been initial indication for placement CT abdomen/pelvis with proper placement seen Patient with recurrent leaking once tube feed rate titrated up Speech therapy recommendations: NPO except meds crushed with puree (if not using PEG); moderate aspiration risk IR reconsulted on 8/7 due to malfunction of PEG, s/p G-tube eval and exchange which was successful  Tube feeds restarted on 8/7, monitor closely   Dysphagia Patient evaluated by speech therapy with recommendation at this time for n.p.o Continued speech therapy recommendations  Acute urinary retention Unclear etiology. Foley catheter placed on 7/31. Urinalysis unremarkable. Continue foley catheter.  Will likely need urology follow-up as an outpatient.  Bilateral pleural effusions Moderate in size per CT read. In setting of anasarca.  Patient without respiratory symptoms at this time. Will watch respiratory status carefully  Anasarca Noted on CT scan with patient showing clinical evidence of generalized edema Likely related to overall nutrition status Patient was managed with Lasix per nephrology which is now discontinued.  Acute on chronic anemia Baseline hemoglobin of about 8. Hemoglobin down to 6.9 on admission. Patient transfused 1 unit of PRBC. Bilirubin normal. INR elevated at 1.4. LDH normal. Hemoglobin appears to have stabilized. Transfuse for hemoglobin <7  Hyperlipidemia Continue Crestor once able  Primary hypertension Uncontrolled. Continue Coreg, Amlodipine, added PO hydralazine scheduled  Diabetes mellitus type 2 Semglee discontinued secondary to down-trending blood sugar and current lack of nutrition intake. Continue sliding scale  insulin  Hypokalemia Potassium supplementation per tube as needed  Hemiplegia History of CVA Patient is on Plavix as an outpatient which is held secondary  to anemia of unclear etiology  Lewy body dementia Noted. Patient seen and evaluated by palliative care medicine at last hospitalization Will consider consulting palliative again  Pressure injury Right heel, mid sacrum. Present on admission.  History of right upper extremity DVT Patient is currently not on therapeutic anticoagulation secondary to anemia of unclear etiology requiring blood transfusion.  Left arm swelling Per daughter, patient was diagnosed with a RUE DVT at [facility], not on anticoagulation secondary to bleeding risk LUE extremity duplex negative for acute/chronic DVT or SVT Consider anticoagulation if hemoglobin remains stable.    DVT prophylaxis: Lovenox Code Status:   Code Status: Full Code Family Communication: None at bedside. Disposition Plan: Discharge pending bed at long-term care facility per [facility]   Consultants:  Infectious disease Nephrology Interventional radiology  Procedures: None  Antimicrobials: Linezolid   Subjective: Patient appears stable  Objective: BP (!) 137/53   Pulse 77   Temp (!) 97.2 F (36.2 C) (Axillary)   Resp 16   Ht 5\' 9"  (1.753 m)   Wt 75.8 kg   SpO2 92%   BMI 24.68 kg/m   Examination: General: NAD  Cardiovascular: S1, S2 present Respiratory: CTAB Abdomen: Soft, nontender, nondistended, bowel sounds present Musculoskeletal: No bilateral pedal edema noted Skin: Normal Psychiatry: Unable to assess    Data Reviewed: I have personally reviewed following labs and imaging studies  CBC Lab Results  Component Value Date   WBC 8.1 10/12/2022   RBC 3.55 (L) 10/12/2022   HGB 9.8 (L) 10/12/2022   HCT 31.8 (L) 10/12/2022   MCV 89.6 10/12/2022   MCH 27.6 10/12/2022   PLT 378 10/12/2022   MCHC 30.8 10/12/2022   RDW 17.4 (H) 10/12/2022   LYMPHSABS 1.3  10/12/2022   MONOABS 0.7 10/12/2022   EOSABS 0.2 10/12/2022   BASOSABS 0.1 10/12/2022     Last metabolic panel Lab Results  Component Value Date   NA 148 (H) 10/12/2022   K 3.9 10/12/2022   CL 111 10/12/2022   CO2 27 10/12/2022   BUN 22 10/12/2022   CREATININE 1.29 (H) 10/12/2022   GLUCOSE 178 (H) 10/12/2022   GFRNONAA 58 (L) 10/12/2022   GFRAA 51 (L) 10/30/2019   CALCIUM 8.4 (L) 10/12/2022   PHOS 4.6 10/08/2022   PROT 6.3 (L) 10/09/2022   ALBUMIN 2.4 (L) 10/09/2022   LABGLOB 3.2 11/11/2015   AGRATIO 1.3 11/11/2015   BILITOT 0.7 10/09/2022   ALKPHOS 68 10/09/2022   AST 37 10/09/2022   ALT 35 10/09/2022   ANIONGAP 10 10/12/2022    GFR: Estimated Creatinine Clearance: 49.5 mL/min (A) (by C-G formula based on SCr of 1.29 mg/dL (H)).  Recent Results (from the past 240 hour(s))  Culture, blood (Routine X 2) w Reflex to ID Panel     Status: None   Collection Time: 10/03/22  4:04 PM   Specimen: BLOOD RIGHT HAND  Result Value Ref Range Status   Specimen Description   Final    BLOOD RIGHT HAND Performed at [facility], [address], [place], [place] [address]    Special Requests   Final    BOTTLES DRAWN AEROBIC AND ANAEROBIC Blood Culture adequate volume Performed at [facility], [address], [place], [place] [address]    Culture   Final    NO GROWTH 5 DAYS Performed at [facility], [address], [place], [place] [address]    Report Status 10/08/2022 FINAL  Final  Culture, blood (Routine X 2)  w Reflex to ID Panel     Status: None   Collection Time: 10/03/22  4:09 PM   Specimen: BLOOD RIGHT HAND  Result Value Ref Range Status   Specimen Description   Final    BLOOD RIGHT HAND Performed at [facility], [address], [place], [place] [address]    Special Requests   Final    BOTTLES DRAWN AEROBIC AND ANAEROBIC Blood Culture adequate volume Performed at [facility], [address], [place],  [place] [address]    Culture   Final    NO GROWTH 5 DAYS Performed at [facility], [address], [place], [place] [address]    Report Status 10/08/2022 FINAL  Final      Radiology Studies: No results found.    LOS: 10 days    Briant Cedar, MD [facility] 10/12/2022, 6:28 PM   If 7PM-7AM, please contact night-coverage [email or web address]

## 2022-10-13 DIAGNOSIS — N179 Acute kidney failure, unspecified: Secondary | ICD-10-CM | POA: Diagnosis not present

## 2022-10-13 LAB — GLUCOSE, CAPILLARY
Glucose-Capillary: 197 mg/dL — ABNORMAL HIGH (ref 70–99)
Glucose-Capillary: 199 mg/dL — ABNORMAL HIGH (ref 70–99)
Glucose-Capillary: 206 mg/dL — ABNORMAL HIGH (ref 70–99)
Glucose-Capillary: 281 mg/dL — ABNORMAL HIGH (ref 70–99)
Glucose-Capillary: 283 mg/dL — ABNORMAL HIGH (ref 70–99)
Glucose-Capillary: 303 mg/dL — ABNORMAL HIGH (ref 70–99)

## 2022-10-13 MED ORDER — LACTATED RINGERS IV SOLN: INTRAVENOUS | Status: DC

## 2022-10-13 MED ORDER — DEXTROSE-SODIUM CHLORIDE 5-0.45 % IV SOLN: INTRAVENOUS | Status: DC

## 2022-10-13 MED ORDER — SODIUM CHLORIDE 0.9 % IV SOLN: INTRAVENOUS | Status: DC

## 2022-10-13 MED ORDER — INSULIN GLARGINE-YFGN 100 UNIT/ML ~~LOC~~ SOLN
10.0000 [IU] | Freq: Every day | SUBCUTANEOUS | Status: DC
  Administered 2022-10-13 – 2022-10-16 (×4): 10 [IU] via SUBCUTANEOUS
  Filled 2022-10-13 (×4): qty 0.1

## 2022-10-13 NOTE — Progress Notes (Signed)
PROGRESS NOTE    Harry [name]  XNA:[id number] DOB: 1945/12/05 DOA: 10/02/2022 PCP: Crist Fat, MD   Brief Narrative: Harry [name] is a 77 y.o. male with a history of bronchitis secondary to metapneumovirus, ischemic stroke, pyelonephritis, CKD stage IIIb, community-acquired pneumonia, hypertension, peripheral neuropathy, tobacco use, peripheral vascular disease, bacteremia currently on antibiotics. Patient presented secondary to dehydration and associated AKI. Concern for possible cellulitis at PEG site. Patient's antibiotics adjusted to Linezolid and Unasyn.    Assessment and Plan:  AKI on CKD stage IIIb Restarted IVF, for 1 day Recent baseline creatinine of 1. Creatinine on admission of 3.41 Presumed secondary to dehydration but is complicated by Vancomycin for bacteremia treatment. Nephrology recommendations (8/5) to discontinue Lasix, hold ARB for 1 to 2 months, in addition to no requirement of outpatient renal follow-up.  Cellulitis Location is the upper abdomen at PEG site.  Presumed source of infection Complicated by history of bacteremia.  PEG tube recently placed Patient also also has a history of leaking from PEG site which may have caused local irritation/dermatitis rather than true cellulitis Completed Augmentin on 8/8 for a total of 10 days  Aspiration pneumonia Possible diagnosis Patient also appears to have difficulty with secretions which places aspiration at high risk for etiology Completed Augmentin on 8/8 for a total of 10 days  Lethargy Unclear etiology Possibly related to waxing waning mentation related to underlying cognitive disease Vs infection ABG ordered and does not suggest a respiratory cause for presentation CT head unremarkable for etiology Will limit sedating medications  Abdominal pain Appears to be worsening. X-ray unremarkable Repeat CT abdomen/pelvis without further acute findings  History of granulicatella adiacens  bacteremia Present on admission ID consulted and have adjusted antibiotics to an oral regimen of Linezolid to complete course Internal jugular line removed on 8/7 Continue Linezolid  Leaking PEG tube- Resolved Recently placed at [facility]. Unsure if patient is safe for oral intake which may have been initial indication for placement CT abdomen/pelvis with proper placement seen Patient with recurrent leaking once tube feed rate titrated up Speech therapy recommendations: NPO except meds crushed with puree (if not using PEG); moderate aspiration risk IR reconsulted on 8/7 due to malfunction of PEG, s/p G-tube eval and exchange which was successful  Tube feeds restarted on 8/7, monitor closely   Dysphagia Patient evaluated by speech therapy with recommendation at this time for n.p.o Continued speech therapy recommendations  Acute urinary retention Unclear etiology. Foley catheter placed on 7/31. Urinalysis unremarkable. Continue foley catheter.  Will likely need urology follow-up as an outpatient.  Bilateral pleural effusions Moderate in size per CT read. In setting of anasarca.  Patient without respiratory symptoms at this time. Will watch respiratory status carefully  Anasarca Noted on CT scan with patient showing clinical evidence of generalized edema Likely related to overall nutrition status Patient was managed with Lasix per nephrology which is now discontinued.  Acute on chronic anemia Baseline hemoglobin of about 8. Hemoglobin down to 6.9 on admission. Patient transfused 1 unit of PRBC. Bilirubin normal. INR elevated at 1.4. LDH normal. Hemoglobin appears to have stabilized. Transfuse for hemoglobin <7  Hyperlipidemia Continue Crestor once able  Primary hypertension Uncontrolled. Continue Coreg, Amlodipine, added PO hydralazine scheduled  Diabetes mellitus type 2 Semglee discontinued secondary to down-trending blood sugar and current lack of nutrition intake. Continue  sliding scale insulin  Hypokalemia Potassium supplementation per tube as needed  Hemiplegia History of CVA Patient is on Plavix as an outpatient which  is held secondary to anemia of unclear etiology  Lewy body dementia Noted. Patient seen and evaluated by palliative care medicine at last hospitalization Will consider consulting palliative again  Pressure injury Right heel, mid sacrum. Present on admission.  History of right upper extremity DVT Patient is currently not on therapeutic anticoagulation secondary to anemia of unclear etiology requiring blood transfusion.  Left arm swelling Per daughter, patient was diagnosed with a RUE DVT at [facility], not on anticoagulation secondary to bleeding risk LUE extremity duplex negative for acute/chronic DVT or SVT Consider anticoagulation if hemoglobin remains stable.    DVT prophylaxis: Lovenox Code Status:   Code Status: Full Code Family Communication: Discussed with daughter over the phone on 8/9 Disposition Plan: Discharge pending bed at long-term care facility per [facility]   Consultants:  Infectious disease Nephrology Interventional radiology  Procedures: None  Antimicrobials: Linezolid   Subjective: Patient appears fairly stable  Objective: BP (!) 151/62   Pulse 72   Temp (!) 97.3 F (36.3 C) (Axillary)   Resp 14   Ht 5\' 9"  (1.753 m)   Wt 76.6 kg   SpO2 98%   BMI 24.94 kg/m   Examination: General: NAD  Cardiovascular: S1, S2 present Respiratory: CTAB Abdomen: Soft, nontender, nondistended, bowel sounds present Musculoskeletal: No bilateral pedal edema noted Skin: Normal Psychiatry: Unable to assess    Data Reviewed: I have personally reviewed following labs and imaging studies  CBC Lab Results  Component Value Date   WBC 9.4 10/13/2022   RBC 3.37 (L) 10/13/2022   HGB 9.4 (L) 10/13/2022   HCT 31.3 (L) 10/13/2022   MCV 92.9 10/13/2022   MCH 27.9 10/13/2022   PLT 348 10/13/2022   MCHC 30.0  10/13/2022   RDW 18.2 (H) 10/13/2022   LYMPHSABS 1.4 10/13/2022   MONOABS 0.8 10/13/2022   EOSABS 0.2 10/13/2022   BASOSABS 0.1 10/13/2022     Last metabolic panel Lab Results  Component Value Date   NA 147 (H) 10/13/2022   K 4.0 10/13/2022   CL 112 (H) 10/13/2022   CO2 26 10/13/2022   BUN 27 (H) 10/13/2022   CREATININE 1.62 (H) 10/13/2022   GLUCOSE 234 (H) 10/13/2022   GFRNONAA 44 (L) 10/13/2022   GFRAA 51 (L) 10/30/2019   CALCIUM 8.2 (L) 10/13/2022   PHOS 4.6 10/08/2022   PROT 6.3 (L) 10/09/2022   ALBUMIN 2.4 (L) 10/09/2022   LABGLOB 3.2 11/11/2015   AGRATIO 1.3 11/11/2015   BILITOT 0.7 10/09/2022   ALKPHOS 68 10/09/2022   AST 37 10/09/2022   ALT 35 10/09/2022   ANIONGAP 9 10/13/2022    GFR: Estimated Creatinine Clearance: 39.4 mL/min (A) (by C-G formula based on SCr of 1.62 mg/dL (H)).  No results found for this or any previous visit (from the past 240 hour(s)).     Radiology Studies: No results found.    LOS: 11 days    Briant Cedar, MD [facility] 10/13/2022, 5:01 PM   If 7PM-7AM, please contact night-coverage [email or web address]

## 2022-10-14 DIAGNOSIS — N179 Acute kidney failure, unspecified: Secondary | ICD-10-CM | POA: Diagnosis not present

## 2022-10-14 LAB — CBC WITH DIFFERENTIAL/PLATELET
Abs Immature Granulocytes: 0.05 10*3/uL (ref 0.00–0.07)
Basophils Absolute: 0.1 10*3/uL (ref 0.0–0.1)
Basophils Relative: 1 %
Eosinophils Absolute: 0.3 10*3/uL (ref 0.0–0.5)
Eosinophils Relative: 3 %
HCT: 28.5 % — ABNORMAL LOW (ref 39.0–52.0)
Hemoglobin: 8.7 g/dL — ABNORMAL LOW (ref 13.0–17.0)
Immature Granulocytes: 1 %
Lymphocytes Relative: 13 %
Lymphs Abs: 1.2 10*3/uL (ref 0.7–4.0)
MCH: 28.1 pg (ref 26.0–34.0)
MCHC: 30.5 g/dL (ref 30.0–36.0)
MCV: 91.9 fL (ref 80.0–100.0)
Monocytes Absolute: 0.6 10*3/uL (ref 0.1–1.0)
Monocytes Relative: 6 %
Neutro Abs: 7.4 10*3/uL (ref 1.7–7.7)
Neutrophils Relative %: 76 %
Platelets: 305 10*3/uL (ref 150–400)
RBC: 3.1 MIL/uL — ABNORMAL LOW (ref 4.22–5.81)
RDW: 18.1 % — ABNORMAL HIGH (ref 11.5–15.5)
WBC: 9.6 10*3/uL (ref 4.0–10.5)
nRBC: 0 % (ref 0.0–0.2)

## 2022-10-14 LAB — BASIC METABOLIC PANEL WITH GFR
Anion gap: 7 (ref 5–15)
BUN: 24 mg/dL — ABNORMAL HIGH (ref 8–23)
CO2: 28 mmol/L (ref 22–32)
Calcium: 8.2 mg/dL — ABNORMAL LOW (ref 8.9–10.3)
Chloride: 112 mmol/L — ABNORMAL HIGH (ref 98–111)
Creatinine, Ser: 1.3 mg/dL — ABNORMAL HIGH (ref 0.61–1.24)
GFR, Estimated: 57 mL/min — ABNORMAL LOW (ref >60)
Glucose, Bld: 201 mg/dL — ABNORMAL HIGH (ref 70–99)
Potassium: 3.8 mmol/L (ref 3.5–5.1)
Sodium: 147 mmol/L — ABNORMAL HIGH (ref 135–145)

## 2022-10-14 LAB — GLUCOSE, CAPILLARY: Glucose-Capillary: 200 mg/dL — ABNORMAL HIGH (ref 70–99)

## 2022-10-14 MED ORDER — HYDRALAZINE HCL 10 MG PO TABS
10.0000 mg | ORAL_TABLET | Freq: Three times a day (TID) | ORAL | Status: DC
  Administered 2022-10-14 – 2022-10-15 (×3): 10 mg
  Filled 2022-10-14 (×3): qty 1

## 2022-10-14 MED ORDER — LACTATED RINGERS IV SOLN: INTRAVENOUS | Status: DC

## 2022-10-14 MED ORDER — MAGIC MOUTHWASH
15.0000 mL | Freq: Three times a day (TID) | ORAL | Status: DC
  Administered 2022-10-14 – 2022-10-16 (×6): 15 mL via ORAL
  Filled 2022-10-14 (×9): qty 15

## 2022-10-14 NOTE — Progress Notes (Addendum)
PROGRESS NOTE    Harry [name]  ZOX:[id number] DOB: Aug 16, 1945 DOA: 10/02/2022 PCP: Crist Fat, MD   Brief Narrative: Harry [name] is a 77 y.o. male with a history of bronchitis secondary to metapneumovirus, ischemic stroke, pyelonephritis, CKD stage IIIb, community-acquired pneumonia, hypertension, peripheral neuropathy, tobacco use, peripheral vascular disease, bacteremia currently on antibiotics. Patient presented secondary to dehydration and associated AKI. Concern for possible cellulitis at PEG site.    Assessment and Plan:  AKI on CKD stage IIIb Mild hyponatremia Restarted IVF, for 1 day Recent baseline creatinine of 1. Creatinine on admission of 3.41 Presumed secondary to dehydration but is complicated by Vancomycin for bacteremia treatment. Nephrology recommendations (8/5) to discontinue Lasix, hold ARB for 1 to 2 months, in addition to no requirement of outpatient renal follow-up.  Cellulitis Location is the upper abdomen at PEG site.  Presumed source of infection Complicated by history of bacteremia.  PEG tube recently placed Patient also also has a history of leaking from PEG site which may have caused local irritation/dermatitis rather than true cellulitis Completed Augmentin on 8/8 for a total of 10 days  Aspiration pneumonia Possible diagnosis Patient also appears to have difficulty with secretions which places aspiration at high risk for etiology Completed Augmentin on 8/8 for a total of 10 days  Lethargy Unclear etiology Possibly related to waxing waning mentation related to underlying cognitive disease Vs infection ABG ordered and does not suggest a respiratory cause for presentation CT head unremarkable for etiology Will limit sedating medications  Abdominal pain Improving X-ray unremarkable Repeat CT abdomen/pelvis without further acute findings  History of granulicatella adiacens bacteremia Present on admission ID consulted and have adjusted  antibiotics to an oral regimen of Linezolid to complete course Internal jugular line removed on 8/7 Continue Linezolid  Leaking PEG tube- Resolved Recently placed at [facility] CT abdomen/pelvis with proper placement seen Speech therapy recommendations: NPO except meds crushed with puree (if not using PEG); moderate aspiration risk IR reconsulted on 8/7 due to malfunction of PEG, s/p G-tube eval and exchange which was successful  Tube feeds restarted on 8/7, monitor closely   Dysphagia Patient evaluated by speech therapy with recommendation at this time for n.p.o Continued speech therapy recommendations  Acute urinary retention Unclear etiology. Foley catheter placed on 7/31. Urinalysis unremarkable. Continue foley catheter.  Will likely need urology follow-up as an outpatient.  Bilateral pleural effusions Moderate in size per CT read. In setting of anasarca Patient without respiratory symptoms at this time Will watch respiratory status carefully  Anasarca Noted on CT scan with patient showing clinical evidence of generalized edema Likely related to overall nutrition status Patient was managed with Lasix per nephrology which is now discontinued.  Acute on chronic anemia Baseline hemoglobin of about 8. Hemoglobin down to 6.9 on admission. Patient transfused 1 unit of PRBC. Bilirubin normal. INR elevated at 1.4. LDH normal. Hemoglobin appears to have stabilized. Transfuse for hemoglobin <7  Hyperlipidemia Continue Crestor once able  Primary hypertension Uncontrolled. Continue Coreg, Amlodipine, added PO hydralazine scheduled  Diabetes mellitus type 2 Semglee discontinued secondary to down-trending blood sugar and current lack of nutrition intake. Continue sliding scale insulin  Hypokalemia Potassium supplementation per tube as needed  Hemiplegia History of CVA Patient is on Plavix as an outpatient which is held secondary to anemia of unclear etiology  Lewy body  dementia Noted. Patient seen and evaluated by palliative care medicine at last hospitalization Will consider consulting palliative again  Pressure injury Right heel, mid  sacrum. Present on admission.  History of right upper extremity DVT Patient is currently not on therapeutic anticoagulation secondary to anemia of unclear etiology requiring blood transfusion.  Left arm swelling Per daughter, patient was diagnosed with a RUE DVT at [facility], not on anticoagulation secondary to bleeding risk LUE extremity duplex negative for acute/chronic DVT or SVT Consider anticoagulation if hemoglobin remains stable  Tongue twitching Noted by patient's daughter while doing oral care EEG pending as requested  GOC discussion Palliative care reached out to daughter on 09/07/2022 about further goals of care discussion, daughter declined discussions of any kind with palliative care Remains full code with full scope of treatment     DVT prophylaxis: Lovenox Code Status:   Code Status: Full Code Family Communication: Discussed with daughter over the phone on 8/9 Disposition Plan: Discharge pending bed at long-term care facility per [facility]   Consultants:  Infectious disease Nephrology Interventional radiology  Procedures: None  Antimicrobials: Linezolid   Subjective: Patient continues to remain lethargic, no new changes observed  Objective: BP (!) 137/57   Pulse 74   Temp (!) 97 F (36.1 C) (Axillary)   Resp 18   Ht 5\' 9"  (1.753 m)   Wt 77.8 kg   SpO2 98%   BMI 25.33 kg/m   Examination: General: NAD, chronically ill-appearing Cardiovascular: S1, S2 present Respiratory: CTAB Abdomen: Soft, nontender, nondistended, bowel sounds present Musculoskeletal: No bilateral pedal edema noted Skin: Normal Psychiatry: Unable to assess    Data Reviewed: I have personally reviewed following labs and imaging studies  CBC Lab Results  Component Value Date   WBC 9.6 10/14/2022   RBC 3.10  (L) 10/14/2022   HGB 8.7 (L) 10/14/2022   HCT 28.5 (L) 10/14/2022   MCV 91.9 10/14/2022   MCH 28.1 10/14/2022   PLT 305 10/14/2022   MCHC 30.5 10/14/2022   RDW 18.1 (H) 10/14/2022   LYMPHSABS 1.2 10/14/2022   MONOABS 0.6 10/14/2022   EOSABS 0.3 10/14/2022   BASOSABS 0.1 10/14/2022     Last metabolic panel Lab Results  Component Value Date   NA 147 (H) 10/14/2022   K 3.8 10/14/2022   CL 112 (H) 10/14/2022   CO2 28 10/14/2022   BUN 24 (H) 10/14/2022   CREATININE 1.30 (H) 10/14/2022   GLUCOSE 201 (H) 10/14/2022   GFRNONAA 57 (L) 10/14/2022   GFRAA 51 (L) 10/30/2019   CALCIUM 8.2 (L) 10/14/2022   PHOS 4.6 10/08/2022   PROT 6.3 (L) 10/09/2022   ALBUMIN 2.4 (L) 10/09/2022   LABGLOB 3.2 11/11/2015   AGRATIO 1.3 11/11/2015   BILITOT 0.7 10/09/2022   ALKPHOS 68 10/09/2022   AST 37 10/09/2022   ALT 35 10/09/2022   ANIONGAP 7 10/14/2022    GFR: Estimated Creatinine Clearance: 49.1 mL/min (A) (by C-G formula based on SCr of 1.3 mg/dL (H)).  No results found for this or any previous visit (from the past 240 hour(s)).     Radiology Studies: No results found.    LOS: 12 days    Briant Cedar, MD [facility] 10/14/2022, 3:22 PM   If 7PM-7AM, please contact night-coverage [email or web address]

## 2022-10-15 ENCOUNTER — Inpatient Hospital Stay (HOSPITAL_COMMUNITY)
Admission: EM | Admit: 2022-10-15 | Discharge: 2022-10-15 | Disposition: A | Discharge status: Home / Self Care | Payer: Medicare Other | Source: Skilled Nursing Facility | Attending: Family Medicine | Admitting: Family Medicine

## 2022-10-15 DIAGNOSIS — N179 Acute kidney failure, unspecified: Secondary | ICD-10-CM | POA: Diagnosis not present

## 2022-10-15 DIAGNOSIS — R4182 Altered mental status, unspecified: Secondary | ICD-10-CM | POA: Diagnosis not present

## 2022-10-15 DIAGNOSIS — R569 Unspecified convulsions: Secondary | ICD-10-CM

## 2022-10-15 LAB — GLUCOSE, CAPILLARY
Glucose-Capillary: 168 mg/dL — ABNORMAL HIGH (ref 70–99)
Glucose-Capillary: 183 mg/dL — ABNORMAL HIGH (ref 70–99)
Glucose-Capillary: 192 mg/dL — ABNORMAL HIGH (ref 70–99)
Glucose-Capillary: 231 mg/dL — ABNORMAL HIGH (ref 70–99)
Glucose-Capillary: 197 mg/dL — ABNORMAL HIGH (ref 70–99)
Glucose-Capillary: 196 mg/dL — ABNORMAL HIGH (ref 70–99)
Glucose-Capillary: 186 mg/dL — ABNORMAL HIGH (ref 70–99)
Glucose-Capillary: 173 mg/dL — ABNORMAL HIGH (ref 70–99)
Glucose-Capillary: 164 mg/dL — ABNORMAL HIGH (ref 70–99)

## 2022-10-15 MED ORDER — HYDRALAZINE HCL 25 MG PO TABS
25.0000 mg | ORAL_TABLET | Freq: Three times a day (TID) | ORAL | Status: DC
  Administered 2022-10-15 – 2022-10-16 (×4): 25 mg
  Filled 2022-10-15 (×4): qty 1

## 2022-10-15 NOTE — Progress Notes (Signed)
PROGRESS NOTE    Harry [name]  KZS:[id number] DOB: Jul 16, 1945 DOA: 10/02/2022 PCP: Crist Fat, MD   Brief Narrative: Harry [name] is a 77 y.o. male with a history of bronchitis secondary to metapneumovirus, ischemic stroke, pyelonephritis, CKD stage IIIb, community-acquired pneumonia, hypertension, peripheral neuropathy, tobacco use, peripheral vascular disease, bacteremia currently on antibiotics. Patient presented secondary to dehydration and associated AKI. Concern for possible cellulitis at PEG site.    Assessment and Plan:  AKI on CKD stage IIIb Mild hypernatremia Restarted IVF, for 1 day, currently discontinued Recent baseline creatinine of 1. Creatinine on admission of 3.41 Presumed secondary to dehydration but is complicated by Vancomycin for bacteremia treatment. Nephrology recommendations (8/5) to discontinue Lasix, hold ARB for 1 to 2 months, in addition to no requirement of outpatient renal follow-up.  Cellulitis around PEG site Improved Location is the upper abdomen at PEG site.  Presumed source of infection Complicated by history of bacteremia Completed Augmentin on 8/8 for a total of 10 days  Aspiration pneumonia Possible diagnosis Patient also appears to have difficulty with secretions which places aspiration at high risk for etiology Completed Augmentin on 8/8 for a total of 10 days  Lethargy Unclear etiology Possibly related to waxing waning mentation related to underlying cognitive disease Vs infection CT head unremarkable for etiology Will limit sedating medications  Abdominal pain Resolved X-ray unremarkable Repeat CT abdomen/pelvis without further acute findings  History of granulicatella adiacens bacteremia Present on admission ID consulted and have adjusted antibiotics to an oral regimen of Linezolid to complete course Internal jugular line removed on 8/7 Continue Linezolid  Leaking PEG tube- Resolved Recently placed at [facility] CT  abdomen/pelvis with proper placement seen Speech therapy recommendations: NPO except meds crushed with puree (if not using PEG); moderate aspiration risk IR reconsulted on 8/7 due to malfunction of PEG, s/p G-tube eval and exchange which was successful  Tube feeds restarted on 8/7, monitor closely   Dysphagia Patient evaluated by speech therapy with recommendation at this time for n.p.o Continued speech therapy recommendations  Acute urinary retention Unclear etiology. Foley catheter placed on 7/31. Urinalysis unremarkable. Continue foley catheter.  Will likely need urology follow-up as an outpatient.  Bilateral pleural effusions Moderate in size per CT read. In setting of anasarca Patient without respiratory symptoms at this time Will watch respiratory status carefully  Anasarca Improved Noted on CT scan with patient showing clinical evidence of generalized edema Likely related to overall nutrition status Patient was managed with Lasix per nephrology which is now discontinued.  Acute on chronic anemia Baseline hemoglobin of about 8. Hemoglobin down to 6.9 on admission. Patient transfused 1 unit of PRBC. Bilirubin normal. INR elevated at 1.4. LDH normal. Hemoglobin appears to have stabilized. Transfuse for hemoglobin <7  Hyperlipidemia Continue Crestor once able  Primary hypertension Uncontrolled. Continue Coreg, Amlodipine, increased PO hydralazine scheduled  Diabetes mellitus type 2 Semglee discontinued secondary to down-trending blood sugar and current lack of nutrition intake. Continue sliding scale insulin  Hypokalemia Potassium supplementation per tube as needed  Hemiplegia History of CVA Patient is on Plavix as an outpatient which is held secondary to anemia of unclear etiology  Lewy body dementia Noted. Patient seen and evaluated by palliative care medicine at last hospitalization  Pressure injury Right heel, mid sacrum. Present on admission.  History of  right upper extremity DVT Patient is currently not on therapeutic anticoagulation secondary to anemia of unclear etiology requiring blood transfusion.  Left arm swelling Per daughter, patient was  diagnosed with a RUE DVT at [facility], not on anticoagulation secondary to bleeding risk LUE extremity duplex negative for acute/chronic DVT or SVT Consider anticoagulation if hemoglobin remains stable  Tongue twitching Noted by patient's daughter while doing oral care on 8/11 EEG pending as requested  GOC discussion Palliative care reached out to daughter on 09/07/2022 about further goals of care discussion, daughter declined discussions of any kind with palliative care Remains full code with full scope of treatment     DVT prophylaxis: Lovenox Code Status:   Code Status: Full Code Family Communication: Discussed with daughter over the phone on 8/9 Disposition Plan: Discharge pending bed at long-term care facility per [facility]   Consultants:  Infectious disease Nephrology Interventional radiology  Procedures: None  Antimicrobials: Linezolid   Subjective: Today, pt more awake, was able to talk to me, mentation waxes and wanes    Objective: BP (!) 172/70   Pulse 83   Temp 98 F (36.7 C) (Axillary)   Resp 17   Ht 5\' 9"  (1.753 m)   Wt 77.8 kg Comment: without prevalon boots (~1 kg)  SpO2 98%   BMI 25.33 kg/m   Examination: General: NAD, chronically ill-appearing Cardiovascular: S1, S2 present Respiratory: CTAB Abdomen: Soft, nontender, nondistended, bowel sounds present Musculoskeletal: No bilateral pedal edema noted Skin: Normal Psychiatry: Unable to assess    Data Reviewed: I have personally reviewed following labs and imaging studies  CBC Lab Results  Component Value Date   WBC 7.5 10/15/2022   RBC 2.90 (L) 10/15/2022   HGB 8.1 (L) 10/15/2022   HCT 26.4 (L) 10/15/2022   MCV 91.0 10/15/2022   MCH 27.9 10/15/2022   PLT 232 10/15/2022   MCHC 30.7 10/15/2022    RDW 18.2 (H) 10/15/2022   LYMPHSABS 1.1 10/15/2022   MONOABS 0.5 10/15/2022   EOSABS 0.3 10/15/2022   BASOSABS 0.1 10/15/2022     Last metabolic panel Lab Results  Component Value Date   NA 144 10/15/2022   K 4.0 10/15/2022   CL 112 (H) 10/15/2022   CO2 27 10/15/2022   BUN 25 (H) 10/15/2022   CREATININE 1.22 10/15/2022   GLUCOSE 216 (H) 10/15/2022   GFRNONAA >60 10/15/2022   GFRAA 51 (L) 10/30/2019   CALCIUM 8.2 (L) 10/15/2022   PHOS 4.6 10/08/2022   PROT 6.3 (L) 10/09/2022   ALBUMIN 2.4 (L) 10/09/2022   LABGLOB 3.2 11/11/2015   AGRATIO 1.3 11/11/2015   BILITOT 0.7 10/09/2022   ALKPHOS 68 10/09/2022   AST 37 10/09/2022   ALT 35 10/09/2022   ANIONGAP 5 10/15/2022    GFR: Estimated Creatinine Clearance: 52.3 mL/min (by C-G formula based on SCr of 1.22 mg/dL).  No results found for this or any previous visit (from the past 240 hour(s)).     Radiology Studies: No results found.    LOS: 13 days    Briant Cedar, MD [facility] 10/15/2022, 1:30 PM   If 7PM-7AM, please contact night-coverage [email or web address]
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
## 2022-10-15 NOTE — Plan of Care (Signed)
  Problem: Clinical Measurements: Goal: Will remain free from infection Outcome: Progressing Goal: Diagnostic test results will improve Outcome: Progressing Goal: Respiratory complications will improve Outcome: Progressing Goal: Cardiovascular complication will be avoided Outcome: Progressing   

## 2022-10-15 NOTE — Progress Notes (Signed)
EEG complete - results pending 

## 2022-10-15 NOTE — TOC Progression Note (Addendum)
Transition of Care [facility]) - Progression Note    Patient Details  Name: Harry [name] MRN: [id number] Date of Birth: February 05, 1946  Transition of Care [facility] CM/SW Contact  Lavenia Atlas, RN Phone Number: 10/15/2022, 2:58 PM  Clinical Narrative:   Per chart review patient is currently in [facility] ICU, MD inquiring about bed availability. Sent FL2 to [facility]. Notified Lawerance Cruel w/ [facility]. Awaiting MD to sign FL2 and [facility] response re: bed availability.  - 3:40pm Per Lawerance Cruel w/[facility] the patient has a bed however will need to know the EDD. Notified MD, RN awaiting a response. TOC will continue to follow for needs.    Expected Discharge Plan: Skilled Nursing Facility Barriers to Discharge: Continued Medical Work up  Expected Discharge Plan and Services In-house Referral: NA Discharge Planning Services: CM Consult Post Acute Care Choice: Skilled Nursing Facility Living arrangements for the past 2 months: Skilled Nursing Facility (LTAC: [facility])                 DME Arranged: N/A DME Agency: NA       HH Arranged: NA HH Agency: NA         Social Determinants of Health (SDOH) Interventions SDOH Screenings   Food Insecurity: No Food Insecurity (09/07/2022)  Housing: Patient Unable To Answer (09/07/2022)  Transportation Needs: No Transportation Needs (09/07/2022)  Utilities: Not At Risk (09/07/2022)  Depression (PHQ2-9): Low Risk  (03/01/2020)  Financial Resource Strain: Low Risk  (09/29/2018)  Physical Activity: Inactive (09/29/2018)  Tobacco Use: Medium Risk (10/04/2022)    Readmission Risk Interventions    10/08/2022    1:37 PM 10/05/2022   11:34 AM 09/17/2022   12:21 PM  Readmission Risk Prevention Plan  Transportation Screening Complete Complete Complete  PCP or Specialist Appt within 3-5 Days   Complete  HRI or Home Care Consult   Complete  Social Work Consult for Recovery Care Planning/Counseling   Complete  Palliative Care Screening   Not Applicable  Medication Review  Oceanographer) Complete Complete Complete  PCP or Specialist appointment within 3-5 days of discharge Complete Complete   HRI or Home Care Consult  Complete   SW Recovery Care/Counseling Consult  Complete   Palliative Care Screening  Not Applicable   Skilled Nursing Facility  Complete

## 2022-10-15 NOTE — Progress Notes (Signed)
Speech Language Pathology Treatment: Dysphagia  Patient Details Name: Harry [name] MRN: [id number] DOB: 06-25-1945 Today's Date: 10/15/2022 Time: 1410-1445 SLP Time Calculation (min) (ACUTE ONLY): 35 min  Assessment / Plan / Recommendation Clinical Impression  Pt seen for skilled SLP for dysphagia management including determing readiness for dietary advancement. Today pt sleepy upon SLP arrival but woke with SLP verbal/visual cues. Oral care provided removing pink colored viscous secretions from oral cavity- RN questions if it is his oral medication/rinse. Pt then provided with single ice chips and nectar thick soda. Pt's swallow continues to be delayed - 6 seconds up to 12 seconds but without clinical indication of aspiration *cough. Counting 1,2,3... did not improve swallow timing unfortunately Per prior visit, coughing is a good indicator of pt's aspiration - thankfully. At this time, SLP did not test other consistencies due to risk - but recommend allow pt nectar liquids via cup and ice chips given by RN only when fully alert and tolerating. RN reports pt did overtly cough when she did his oral rinse medication. Purpose of nectar/ice po is to decrease disuse muscle atrophy and to maximize his QOL, oral hygeine. MD agreeable to plan and RNs informed - Swallow precaution signs posted in room. SLP will follow up briefly to assure tolerance and for family education. Do not anticipate pt's swallow will improve to allow efficient nutrition/hydration by mouth alone - thus continue PEG for nutrition. Pt thankful for his intake today.     HPI HPI: Pt is a 77 yo male presenting 7/30 with dehydration and AKI. W/u concerning for cellulitis at PEG site. Pt recently d/c to [facility] 7/15 and PEG was placed there. Pt had MBS 09/10/22 with pharyngeal swallow functional but overall intake impacted by impaired oral manipulation and significant delays in the onset of pharyngeal swallow. Aspiration occurred x1 and did  elicit a cough response. Pt clinically had more difficulty due at least in part to positioning, and Dys 1 diet with nectar thick liquids was recommended while inpatient. PMH includes: Lewy body dementia, PNA, stroke, DMII, CKD 3A, anemia, HTN, HLD, syncope, PAD s/p angioplasty and stenting, depression, glaucoma.  Pt underwent MBS a week ago and recommendation was made for NPO.  SLP follow up clinically for readiness for po intake - - with understanding that he will rely on PEG for nutritional support.      SLP Plan  Continue with current plan of care      Recommendations for follow up therapy are one component of a multi-disciplinary discharge planning process, led by the attending physician.  Recommendations may be updated based on patient status, additional functional criteria and insurance authorization.    Recommendations  Diet recommendations: Nectar-thick liquid;Other(comment) (ice chips) Liquids provided via: Cup Supervision: Trained caregiver to feed patient Compensations: Slow rate;Other (Comment) (fully upright, assure pt swallows before giving more) Postural Changes and/or Swallow Maneuvers: Seated upright 90 degrees (fully)                  Oral care QID   Frequent or constant Supervision/Assistance Dysphagia, oropharyngeal phase (R13.12)     Continue with current plan of care    Harry Infante, MS [facility] SLP Acute Rehab Services Office [phone]  Harry [name]  10/15/2022, 3:26 PM

## 2022-10-15 NOTE — Procedures (Signed)
Patient Name: CAIDIN TAVERAS  MRN: [id number]  Epilepsy Attending: Charlsie Quest  Referring Physician/Provider: Briant Cedar, MD  Date: 10/15/2022 Duration: 24.50 mins  Patient history: 77yo M with ams getting eeg to evaluate for seizure  Level of alertness: Awake  AEDs during EEG study: None  Technical aspects: This EEG study was done with scalp electrodes positioned according to the 10-20 International system of electrode placement. Electrical activity was reviewed with band pass filter of 1-70Hz , sensitivity of 7 uV/mm, display speed of 70mm/sec with a 60Hz  notched filter applied as appropriate. EEG data were recorded continuously and digitally stored.  Video monitoring was available and reviewed as appropriate.  Description:  The posterior dominant rhythm consists of 8 Hz activity of moderate voltage (25-35 uV) seen predominantly in posterior head regions, symmetric and reactive to eye opening and eye closing. Intermittent generalized 3-5Hz  theta-delta slowing was noted. Physiologic photic driving was not seen during photic stimulation.  Hyperventilation was not performed.      ABNORMALITY - Intermittent slow, generalized   IMPRESSION: This study is suggestive of mild diffuse encephalopathy, nonspecific etiology. No seizures or epileptiform discharges were seen throughout the recording.    Annabelle Harman

## 2022-10-16 DIAGNOSIS — N179 Acute kidney failure, unspecified: Secondary | ICD-10-CM | POA: Diagnosis not present

## 2022-10-16 LAB — GLUCOSE, CAPILLARY
Glucose-Capillary: 130 mg/dL — ABNORMAL HIGH (ref 70–99)
Glucose-Capillary: 147 mg/dL — ABNORMAL HIGH (ref 70–99)
Glucose-Capillary: 147 mg/dL — ABNORMAL HIGH (ref 70–99)
Glucose-Capillary: 180 mg/dL — ABNORMAL HIGH (ref 70–99)
Glucose-Capillary: 186 mg/dL — ABNORMAL HIGH (ref 70–99)
Glucose-Capillary: 93 mg/dL (ref 70–99)

## 2022-10-16 MED ORDER — HYDRALAZINE HCL 25 MG PO TABS: 25.0000 mg | ORAL_TABLET | Freq: Three times a day (TID) | ORAL | Status: DC

## 2022-10-16 MED ORDER — MEDIHONEY WOUND/BURN DRESSING EX PSTE: 1.0000 | PASTE | Freq: Every day | CUTANEOUS | Status: DC

## 2022-10-16 MED ORDER — LINEZOLID 600 MG PO TABS: 600.0000 mg | ORAL_TABLET | Freq: Two times a day (BID) | ORAL | Status: DC

## 2022-10-16 MED ORDER — MAGIC MOUTHWASH: 15.0000 mL | Freq: Three times a day (TID) | ORAL | Status: DC

## 2022-10-16 MED ORDER — AMLODIPINE BESYLATE 10 MG PO TABS: 10.0000 mg | ORAL_TABLET | Freq: Every day | ORAL | Status: DC

## 2022-10-16 MED ORDER — FREE WATER: 200.0000 mL | Status: DC

## 2022-10-16 MED ORDER — ADULT MULTIVITAMIN LIQUID CH: 15.0000 mL | Freq: Every day | ORAL | Status: AC

## 2022-10-16 MED ORDER — JEVITY 1.5 CAL/FIBER PO LIQD: 1000.0000 mL | ORAL | Status: DC

## 2022-10-16 NOTE — Discharge Summary (Addendum)
Physician Discharge Summary   Patient: Harry [name] MRN: [id number] DOB: 18-Sep-1945  Admit date:     10/02/2022  Discharge date: 10/16/22  Discharge Physician: Briant Cedar   PCP: Crist Fat, MD   Recommendations at discharge:   Follow-up with PCP in 1 week  Discharge Diagnoses: Principal Problem:   AKI (acute kidney injury) (HCC) Active Problems:   Hyperlipidemia LDL goal <70   Essential hypertension   Hemiplegia, late effect of cerebrovascular disease (HCC)   Lewy body dementia (HCC)   History of CVA (cerebrovascular accident)   Dehydration   Stage 3b chronic kidney disease (HCC)   Acute on chronic anemia   Bacteremia   Pressure injury of skin   Cellulitis of abdominal wall   Type 2 diabetes mellitus with hyperglycemia [facility])    Hospital Course: Harry [name] is a 77 y.o. male with a history of bronchitis secondary to metapneumovirus, ischemic stroke, pyelonephritis, CKD stage IIIb, community-acquired pneumonia, hypertension, peripheral neuropathy, tobacco use, peripheral vascular disease, bacteremia currently on antibiotics. Patient presented secondary to dehydration and associated AKI.   Today, patient appears stable, was able to have a simple conversation with me.  Discussed extensively with daughter about overall care and the plan to discharge.  Daughter in agreement.    Assessment and Plan:  AKI on CKD stage IIIb Mild hypernatremia S/p IVF Recent baseline creatinine of 1. Creatinine on admission of 3.41 Nephrology recommendations (8/5) to discontinue Lasix, hold ARB for 1 to 2 months, in addition to no requirement of outpatient renal follow-up.   Cellulitis around PEG site Improved Location is the upper abdomen at PEG site.  Presumed source of infection Complicated by history of bacteremia Completed Augmentin on 8/8 for a total of 10 days   Aspiration pneumonia Possible diagnosis Patient also appears to have difficulty with secretions  which places aspiration at high risk for etiology Completed Augmentin on 8/8 for a total of 10 days   Lethargy Unclear etiology Possibly related to waxing waning mentation related to underlying cognitive disease Vs infection CT head unremarkable for etiology Will limit sedating medications   Abdominal pain Resolved X-ray unremarkable Repeat CT abdomen/pelvis without further acute findings   History of granulicatella adiacens bacteremia Present on admission ID consulted and have adjusted antibiotics to an oral regimen of Linezolid to complete course Internal jugular line removed on 8/7 Continue Linezolid till 8/19   Leaking PEG tube- Resolved Recently placed at [facility] CT abdomen/pelvis with proper placement seen Speech therapy recommendations: NPO except meds crushed with puree (if not using PEG); moderate aspiration risk IR reconsulted on 8/7 due to malfunction of PEG, s/p G-tube eval and exchange which was successful  Tube feeds restarted on 8/7, monitor closely  Noted loose stools s/p rectal tube likely 2/2 tube feeds   Dysphagia Patient evaluated by speech therapy  Diet recommendations: Nectar-thick liquid, (ice chips) Liquids provided via: Cup Supervision: Trained caregiver to feed patient Compensations: Slow rate, fully upright, assure pt swallows before giving more Postural Changes and/or Swallow Maneuvers: Seated upright 90 degrees (fully) Continue tube feeds   Acute urinary retention Unclear etiology. Foley catheter placed on 7/31. Urinalysis unremarkable. Continue foley catheter Urology as outpatient   Bilateral pleural effusions Moderate in size per CT read. In setting of anasarca Patient without respiratory symptoms at this time On RA   Anasarca Improved Noted on CT scan with patient showing clinical evidence of generalized edema Likely related to overall nutrition status Patient was managed with Lasix per  nephrology which was discontinued.   Acute on  chronic anemia Baseline hemoglobin of about 8. Hemoglobin down to 6.9 on admission. Patient transfused 1 unit of PRBC. Hemoglobin appears to have stabilized. Continue oral iron supplementation   Hyperlipidemia Continue Crestor   Primary hypertension Continue Coreg, Amlodipine, PO hydralazine    Diabetes mellitus type 2 Semglee, continue sliding scale insulin    Hypokalemia Potassium supplementation per tube as needed   Hemiplegia History of CVA Continue Plavix, discontinue ASA due to chronic anemia of unknown etiology   Lewy body dementia   Pressure injury Right heel, mid sacrum. Present on admission.   History of right upper extremity DVT Patient is currently not on therapeutic anticoagulation secondary to anemia of unclear etiology requiring blood transfusion.   Left arm swelling Per daughter, patient was diagnosed with a RUE DVT at [facility], not on anticoagulation secondary to bleeding risk LUE extremity duplex negative for acute/chronic DVT or SVT   Tongue twitching Noted by patient's daughter while doing oral care on 8/11 EEG unremarkable   GOC discussion Palliative care reached out to daughter on 09/07/2022 about further goals of care discussion, daughter declined discussions of any kind with palliative care Remains full code with full scope of treatment         Consultants: Infectious disease, nephrology, IR Procedures performed: None Disposition: Skilled nursing facility Diet recommendation: As mentioned above    DISCHARGE MEDICATION: Allergies as of 10/16/2022       Reactions   Lipitor [atorvastatin] Other (See Comments)   Myalgias   Statins Other (See Comments)   Myalgias  Pt tolerating rosuvastatin 5mg  QD   Pravachol [pravastatin] Rash        Medication List     STOP taking these medications    aspirin EC 81 MG tablet   docusate sodium 100 MG capsule Commonly known as: COLACE   polyethylene glycol powder 17 GM/SCOOP powder Commonly  known as: GLYCOLAX/MIRALAX       TAKE these medications    amLODipine 10 MG tablet Commonly known as: NORVASC Place 1 tablet (10 mg total) into feeding tube daily. Start taking on: October 17, 2022   carvedilol 12.5 MG tablet Commonly known as: COREG Take 1 tablet (12.5 mg total) by mouth 2 (two) times daily with a meal.   clopidogrel 75 MG tablet Commonly known as: PLAVIX Take 75 mg by mouth daily.   feeding supplement (JEVITY 1.5 CAL/FIBER) Liqd Place 1,000 mLs into feeding tube continuous.   ferrous gluconate 324 MG tablet Commonly known as: FERGON Take 324 mg by mouth every evening.   free water Soln Place 200 mLs into feeding tube every 4 (four) hours.   GenTeal Tears Severe Day/Night 0.4-0.3 % Gel ophthalmic gel Generic drug: Polyethyl Glycol-Propyl Glycol Place 1 Application into both eyes 3 (three) times daily.   hydrALAZINE 25 MG tablet Commonly known as: APRESOLINE Place 1 tablet (25 mg total) into feeding tube every 8 (eight) hours.   insulin lispro 100 UNIT/ML injection Commonly known as: HUMALOG Inject 0-12 Units into the skin See admin instructions. Inject 0-12 units three times daily before meals per sliding scale: BG < 70, call NP/PA. BG 70 - 200 : 0 units BG 201-250 : 2 units BG 251-300 : 4 units BG 301-350 : 6 units BG 351-400 : 8 units BG 401-450 : 10 units BG 451-500 : 12 units BG > 500 : 12 units, recheck in 2 hours. If still > 350 or < 100 notify provider BG  is 451 to 600 give 12 units.   Lantus SoloStar 100 UNIT/ML Solostar Pen Generic drug: insulin glargine Inject 13 Units into the skin at bedtime.   leptospermum manuka honey Pste paste Apply 1 Application topically daily. Start taking on: October 17, 2022   linezolid 600 MG tablet Commonly known as: ZYVOX Place 1 tablet (600 mg total) into feeding tube every 12 (twelve) hours for 13 doses.   magic mouthwash Soln Take 15 mLs by mouth 3 (three) times daily.   multivitamin  Liqd Place 15 mLs into feeding tube daily. Start taking on: October 17, 2022   pyridostigmine 60 MG tablet Commonly known as: MESTINON Take 0.5 tablets (30 mg total) by mouth every 8 (eight) hours.   rosuvastatin 5 MG tablet Commonly known as: CRESTOR Take 5 mg by mouth at bedtime.   sertraline 25 MG tablet Commonly known as: ZOLOFT Take 25 mg by mouth daily.   tamsulosin 0.4 MG Caps capsule Commonly known as: FLOMAX Take 1 capsule (0.4 mg total) by mouth daily.        Follow-up Information     Leonia Reader, Barbara Cower, MD. Schedule an appointment as soon as possible for a visit in 1 week(s).   Specialty: Internal Medicine Contact information: [address] [place] [address] [phone]                Discharge Exam: Ceasar Mons Weights   10/14/22 0500 November 04, 2022 0500 10/16/22 0500  Weight: 77.8 kg 77.8 kg 78 kg   General: NAD, chronically ill-appearing Cardiovascular: S1, S2 present Respiratory: CTAB Abdomen: Soft, nontender, nondistended, bowel sounds present Musculoskeletal: No bilateral pedal edema noted Skin: Normal Psychiatry: Unable to assess   Condition at discharge: fair  The results of significant diagnostics from this hospitalization (including imaging, microbiology, ancillary and laboratory) are listed below for reference.   Imaging Studies: EEG adult  Result Date: 11/04/2022 Harry Quest, MD     Nov 04, 2022  4:45 PM Patient Name: Harry [name] MRN: [id number] Epilepsy Attending: Charlsie [name] Referring Physician/Provider: Briant Cedar, MD Date: 11-04-22 Duration: 24.50 mins Patient history: 77yo M with ams getting eeg to evaluate for seizure Level of alertness: Awake AEDs during EEG study: None Technical aspects: This EEG study was done with scalp electrodes positioned according to the 10-20 International system of electrode placement. Electrical activity was reviewed with band pass filter of 1-70Hz , sensitivity of 7 uV/mm, display speed  of 94mm/sec with a 60Hz  notched filter applied as appropriate. EEG data were recorded continuously and digitally stored.  Video monitoring was available and reviewed as appropriate. Description:  The posterior dominant rhythm consists of 8 Hz activity of moderate voltage (25-35 uV) seen predominantly in posterior head regions, symmetric and reactive to eye opening and eye closing. Intermittent generalized 3-5Hz  theta-delta slowing was noted. Physiologic photic driving was not seen during photic stimulation.  Hyperventilation was not performed.    ABNORMALITY - Intermittent slow, generalized  IMPRESSION: This study is suggestive of mild diffuse encephalopathy, nonspecific etiology. No seizures or epileptiform discharges were seen throughout the recording.  Harry [name]   IR REPLACE G-TUBE SIMPLE WO FLUORO  Result Date: 10/11/2022 INDICATION: assess G tube site EXAM: EVALUATION AND EXCHANGE OF GASTROSTOMY TUBE COMPARISON:  CT AP, 10/08/2022. MEDICATIONS: None. CONTRAST:  10 mL Isovue-300-administered into the gastric lumen FLUOROSCOPY TIME:  None COMPLICATIONS: None immediate. PROCEDURE: Informed written consent was obtained from the patient and/or patient's representative after a discussion of the risks, benefits and  alternatives to treatment. Questions regarding the procedure were encouraged and answered. A timeout was performed prior to the initiation of the procedure. The upper abdomen and external portion of the existing gastrostomy tube was prepped and draped in the usual sterile fashion, and a sterile drape was applied covering the operative field. Maximum barrier sterile technique with sterile gowns and gloves were used for the procedure. A timeout was performed prior to the initiation of the procedure. The existing gastrostomy tube was removed and exchanged for a new 24 Fr balloon inflatable gastrostomy tube. The balloon was inflated with saline and dilute contrast and pulled against the anterior inner  lumen of the stomach and the external disc was cinched. Contrast was injected and a post procedural spot fluoroscopic image was obtained confirming appropriate positioning and functionality of the new gastrostomy tube. A dressing was applied. The patient tolerated the procedure well without immediate postprocedural complication. IMPRESSION: Successful replacement and upsize for a new 24 Fr balloon-retention gastrostomy tube. The gastrostomy tube is ready for immediate use. PLAN: Patient will return to [facility] ([facility]) for routine gastrostomy tube evaluation and exchange in 6 months. Harry Banning, MD [facility] Electronically Signed   By: Harry [name] M.D.   On: 10/11/2022 14:28   DG Abd Portable 1V  Result Date: 10/10/2022 CLINICAL DATA:  14941 Gastrostomy status (HCC) 14941 EXAM: PORTABLE ABDOMEN - 1 VIEW COMPARISON:  KUB, 10/07/2022.  CT AP, 10/08/2022. FINDINGS: Support lines: Gastrostomy tube, with tip positioned at the gastric antrum. Intraluminal contrast opacification of the stomach, demonstrating rugae. Intraluminal colonic opacification with contrast, likely previously ingested. Nonobstructed bowel-gas pattern. No acute osseous abnormality. IMPRESSION: 1. Well-positioned gastrostomy tube, with tip of the gastric antrum. 2. Nonobstructed bowel-gas pattern. Electronically Signed   By: Harry [name] M.D.   On: 10/10/2022 16:59   CT ABDOMEN PELVIS WO CONTRAST  Result Date: 10/08/2022 CLINICAL DATA:  Abdominal pain EXAM: CT ABDOMEN AND PELVIS WITHOUT CONTRAST TECHNIQUE: Multidetector CT imaging of the abdomen and pelvis was performed following the standard protocol without IV contrast. RADIATION DOSE REDUCTION: This exam was performed according to the departmental dose-optimization program which includes automated exposure control, adjustment of the mA and/or kV according to patient size and/or use of iterative reconstruction  technique. COMPARISON:  10/03/2022 FINDINGS: Lower chest: Moderate to large bilateral pleural effusions are seen. There are patchy infiltrates in the posterior mid and lower lung fields suggesting atelectasis/pneumonia. Coronary artery calcifications are seen. Hepatobiliary: There is no dilation of bile ducts. Subcentimeter low-density in the right lobe may suggest cysts or hemangioma. Calcified gallbladder stones are seen. Pancreas: No focal abnormalities are seen. Spleen: Unremarkable. Adrenals/Urinary Tract: Adrenals are unremarkable. There is no hydronephrosis. There are scattered calcifications seen renal artery branches. No definite renal or ureteral stones are seen. There is 1.7 cm cyst in the midportion of right kidney. There is diffuse wall thickening in the urinary bladder. Foley catheter is seen in the bladder. Stomach/Bowel: Stomach is not distended. Percutaneous gastrostomy tube is noted with its tip in the lumen of the stomach. There is subcutaneous stranding and fluid in the anterior abdominal wall at the point of entry of PEG tube. There is no loculated thick-walled fluid collections. There is no extravasation of oral contrast outside the lumen of GI tract. Small bowel loops are not dilated. Appendix is not dilated. There is no significant focal wall thickening in colon. Oral contrast has reached the rectum. Vascular/Lymphatic: Calcifications are seen in aorta and its  major branches. Reproductive: Prostate is enlarged. Other: There is no pneumoperitoneum. Minimal ascites. There is diffuse edema in subcutaneous plane in the chest wall suggesting anasarca. Musculoskeletal: No acute findings are seen. IMPRESSION: There is no evidence of intestinal obstruction or pneumoperitoneum. Peg tube is noted in usual position with its tip in the lumen of the stomach. There is no extravasation of oral contrast. Appendix is not dilated. There is no hydronephrosis. Moderate to large bilateral pleural effusions.  Minimal ascites. There is diffuse edema in subcutaneous plane suggesting anasarca. Gallbladder stones. There is no dilation of bile ducts. Possible cysts in liver and right kidney. There are patchy infiltrates in the posterior lower lung fields suggesting compression atelectasis or pneumonia. Coronary artery calcifications are seen. Aortic arteriosclerosis. Electronically Signed   By: Ernie Avena M.D.   On: 10/08/2022 17:30   DG Swallowing Func-Speech Pathology  Result Date: 10/08/2022 Table formatting from the original result was not included. Modified Barium Swallow Study Patient Details Name: HEROD CONNERTON MRN: [id number] Date of Birth: May 28, 1945 Today's Date: 10/08/2022 HPI/PMH: HPI: Pt is a 77 yo male presenting 7/30 with dehydration and AKI. W/u concerning for cellulitis at PEG site. Pt recently d/c to [facility] 7/15 and PEG was placed there. Pt had MBS 09/10/22 with pharyngeal swallow functional but overall intake impacted by impaired oral manipulation and significant delays in the onset of pharyngeal swallow. Aspiration occurred x1 and did elicit a cough response. Pt clinically had more difficulty due at least in part to positioning, and Dys 1 diet with nectar thick liquids was recommended while inpatient. PMH includes: Lewy body dementia, PNA, stroke, DMII, CKD 3A, anemia, HTN, HLD, syncope, PAD s/p angioplasty and stenting, depression, glaucoma. Clinical Impression: Clinical Impression: Patient presents with a moderately impaired oral and pharyngeal swallow as per this modified barium swallow study. During oral phase, patient with inefficient lingual transit of bolus from anterior to posterior portion of oral cavity. Delayed oral phase led to significantly delayed swallow initiation during pharyngeal phase, such that barium consistencies of thin liquids, nectar thick liquids and honey thick liquids all entered into pyriform sinus and remained there until swallow was initiated, which was at times, as  long as 16 seconds. Swallow initiation with puree/pudding thick barium was delayed at level of vallecular sinus. During this study, there was only one instance of sensed aspiration (PAS 7) with nectar thick liquids, however this was a moderate amount and occured when nectar thick barium flowed over epiglottis (which was curled inward towards base of tongue) and directly into open airway. Patient did exhibit cough response but this was not timely or productive enough to clear aspirate. Although no other instances of aspiration observed, patient is at high risk of this occuring with thin, nectar thick and honey thick secondary to the significant delay in swallow initiation that occured as well as the poorly timed epiglottic closure. No significant amount of oral or pharyngeal residuals were observed s/s swallows initiated. SLP to discuss GOC with MD and patient's daughter prior to making PO recommendations. Factors that may increase risk of adverse event in presence of aspiration Rubye Oaks & Clearance Coots 2021): Factors that may increase risk of adverse event in presence of aspiration Rubye Oaks & Clearance Coots 2021): Reduced cognitive function; Limited mobility; Weak cough; Dependence for feeding and/or oral hygiene Recommendations/Plan: Swallowing Evaluation Recommendations Swallowing Evaluation Recommendations Recommendations: Alternative means of nutrition - G Tube; NPO Postural changes: Position pt fully upright for meals Oral care recommendations: Oral care before ice chips/water; Oral care BID (  2x/day) Caregiver Recommendations: Have oral suction available Treatment Plan Treatment Plan Treatment recommendations: Therapy as outlined in treatment plan below Follow-up recommendations: Skilled nursing-short term rehab (<3 hours/day) Functional status assessment: Patient has had a recent decline in their functional status and demonstrates the ability to make significant improvements in function in a reasonable and predictable amount  of time. Treatment frequency: Min 2x/week Treatment duration: 2 weeks Interventions: Patient/family education; Trials of upgraded texture/liquids Recommendations Recommendations for follow up therapy are one component of a multi-disciplinary discharge planning process, led by the attending physician.  Recommendations may be updated based on patient status, additional functional criteria and insurance authorization. Assessment: Orofacial Exam: Orofacial Exam Oral Cavity: Oral Hygiene: WFL Oral Cavity - Dentition: Missing dentition Anatomy: Anatomy: WFL Boluses Administered: Boluses Administered Boluses Administered: Thin liquids (Level 0); Mildly thick liquids (Level 2, nectar thick); Moderately thick liquids (Level 3, honey thick); Puree  Oral Impairment Domain: Oral Impairment Domain Lip Closure: No labial escape Tongue control during bolus hold: Cohesive bolus between tongue to palatal seal Bolus preparation/mastication: Slow prolonged chewing/mashing with complete recollection Bolus transport/lingual motion: Repetitive/disorganized tongue motion Oral residue: Trace residue lining oral structures Location of oral residue : Tongue Initiation of pharyngeal swallow : Pyriform sinuses  Pharyngeal Impairment Domain: Pharyngeal Impairment Domain Soft palate elevation: No bolus between soft palate (SP)/pharyngeal wall (PW) Laryngeal elevation: Complete superior movement of thyroid cartilage with complete approximation of arytenoids to epiglottic petiole Anterior hyoid excursion: Complete anterior movement Epiglottic movement: Complete inversion Laryngeal vestibule closure: Incomplete, narrow column air/contrast in laryngeal vestibule Pharyngeal stripping wave : Present - complete Pharyngeal contraction (A/P view only): N/A Pharyngoesophageal segment opening: Complete distension and complete duration, no obstruction of flow Tongue base retraction: No contrast between tongue base and posterior pharyngeal wall (PPW)  Pharyngeal residue: Complete pharyngeal clearance Location of pharyngeal residue: N/A  Esophageal Impairment Domain: Esophageal Impairment Domain Esophageal clearance upright position: Esophageal retention Pill: No data recorded Penetration/Aspiration Scale Score: Penetration/Aspiration Scale Score 1.  Material does not enter airway: Puree; Moderately thick liquids (Level 3, honey thick); Thin liquids (Level 0) 7.  Material enters airway, passes BELOW cords and not ejected out despite cough attempt by patient: Mildly thick liquids (Level 2, nectar thick) Compensatory Strategies: Compensatory Strategies Compensatory strategies: Yes Straw: Ineffective   General Information: Caregiver present: No  Diet Prior to this Study: NPO; G-tube   Temperature : Normal   Respiratory Status: WFL   Supplemental O2: None (Room air)   History of Recent Intubation: No  Behavior/Cognition: Alert; Cooperative; Requires cueing Self-Feeding Abilities: Dependent for feeding Baseline vocal quality/speech: Normal Volitional Cough: Unable to elicit Volitional Swallow: Able to elicit Exam Limitations: No limitations Goal Planning: Prognosis for improved oropharyngeal function: Fair Barriers to Reach Goals: Severity of deficits; Cognitive deficits; Overall medical prognosis No data recorded Patient/Family Stated Goal: daughter reports that being able to eat and drink is important to his QOL Consulted and agree with results and recommendations: Pt unable/family or caregiver not available Pain: Pain Assessment Pain Assessment: Faces Faces Pain Scale: 0 Breathing: 0 Negative Vocalization: 2 Facial Expression: 2 Body Language: 2 Consolability: 1 PAINAD Score: 7 Facial Expression: 2 Body Movements: 1 Muscle Tension: 1 Compliance with ventilator (intubated pts.): N/A Vocalization (extubated pts.): 1 CPOT Total: 5 End of Session: Start Time:SLP Start Time (ACUTE ONLY): 0910 Stop Time: SLP Stop Time (ACUTE ONLY): 0930 Time Calculation:SLP Time  Calculation (min) (ACUTE ONLY): 20 min Charges: SLP Evaluations $ SLP Speech Visit: 1 Visit SLP Evaluations $Swallowing Treatment:  1 Procedure SLP visit diagnosis: SLP Visit Diagnosis: Dysphagia, oropharyngeal phase (R13.12) Past Medical History: Past Medical History: Diagnosis Date  Acute bronchitis due to human metapneumovirus 06/05/2020  Acute ischemic stroke (HCC) 08/12/2018  Acute metabolic encephalopathy 06/03/2020  Acute pyelonephritis 06/30/2016  AKI (acute kidney injury) (HCC) 09/20/2016  CAP (community acquired pneumonia) 06/03/2020  CKD (chronic kidney disease), stage III (HCC)   Hyperlipidemia   Hypertension   Microalbuminuria   Peripheral neuropathy   Pneumonia 06/03/2020  PVD (peripheral vascular disease) (HCC)   a. 08/2014: directional atherectomy +  drug eluding balloon angioplasty on the left SFA. 09/2014: staged R SFA intervention with directional atherectomy + drug eluting balloon angioplasty. c. F/u angio 10/2014: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

## 2022-10-16 NOTE — Progress Notes (Addendum)
Patient belongings verified and sent with patient. PTAR provided with paperwork. Daughter [name] notified of patient's departure with PTAR. [place] notified of patient's departure also.

## 2022-10-16 NOTE — Progress Notes (Addendum)
Nutrition Follow-up  DOCUMENTATION CODES:   Not applicable  INTERVENTION:  - Continue goal tube feeds via G-tube: Jevity 1.5 at 60 ml/h  Provides 2160 kcal, 92 gm protein, 1094 ml free water daily   - Q4H free water flushes + free water from formula provides total of 2227mL/day   - Multivitamin with minerals daily via tube   - Monitor weight trends.    - Monitor for diet advancement.  - Per TOC, patient to discharge to [facility] facility which does not have Jevity 1.5, likely has Osmolite 1.5. Once patient discharges to facility, can utilize equivalent formula as appropriate. Recommend dietitian follow with patient at facility to determine appropriate TF regimen.   NUTRITION DIAGNOSIS:   Increased nutrient needs related to wound healing as evidenced by estimated needs. *ongoing  GOAL:   Patient will meet greater than or equal to 90% of their needs *met with TF  MONITOR:   Labs, Weight trends, I & O's, TF tolerance, Skin  REASON FOR ASSESSMENT:   Consult Enteral/tube feeding initiation and management  ASSESSMENT:   77 y.o. male with medical history significant of bronchitis due to human metapneumovirus, history ischemic stroke, history of metabolic encephalopathy, pyelonephritis, AKI, community-acquired pneumonia, stage III CKD, hyperlipidemia, hypertension, microalbuminuria, peripheral neuropathy, history of pneumonia, history of sepsis, tobacco abuse, peripheral vascular disease, left eye vision loss who was recently discharged on 09/17/2022 to [facility] after hospitalization for AKI and bacteremia due to [facility] ADIACENS currently on vancomycin until 10/22/2022 per ID recommendations.  While he was at [facility], they placed a PEG.  Unfortunately, the PEG site seems to have developed cellulitis and the patient has become dehydrated and again developed acute kidney injury.  G tube was exchanged by IR 8/7 and tube feeds restarted same day. Advanced to goal of  75mL/hr on 8/10 and patient tolerating well since that time. No documented issues. Confirmed with RN at bedside today tube feeds going well.   Patient continues to be followed by SLP. Last eval yesterday and patient recommended nectar-thick liquids and ice chips provided by RN only and only when awake. As patient's mentation continues to wax and wane, suspect he will continue to require PEG tube feeds to meet nutritional needs.    Admit weight: 189# Current weight: 172# *Weight noted to be decreased from admit however patient -6L which could be affecting weight status. Will need to monitor weight trends closely.  Medications reviewed and include: 220mg  ferrous sulfate, Q4H FWF, MVI  Labs reviewed:  Na 146 HA1C 6.5 Blood Glucose 147-197 x24 hours   Diet Order:   Diet Order             Diet NPO time specified  Diet effective now                   EDUCATION NEEDS:  Not appropriate for education at this time  Skin:  Skin Assessment: Reviewed RN Assessment Skin Integrity Issues:: DTI DTI: bilateral heels, stage 2 sacrum  Last BM:  8/11 - rectal tube  Height:  Ht Readings from Last 1 Encounters:  10/02/22 5\' 9"  (1.753 m)   Weight:  Wt Readings from Last 1 Encounters:  10/16/22 78 kg    BMI:  Body mass index is 25.39 kg/m.  Estimated Nutritional Needs:  Kcal:  2100-2300 Protein:  105-115g Fluid:  2.1L/day    Shelle Iron RD, LDN For contact information, refer to [facility].

## 2022-10-16 NOTE — Plan of Care (Signed)
  Problem: Clinical Measurements: Goal: Will remain free from infection Outcome: Progressing Goal: Diagnostic test results will improve Outcome: Progressing Goal: Respiratory complications will improve Outcome: Progressing Goal: Cardiovascular complication will be avoided Outcome: Progressing   Problem: Safety: Goal: Ability to remain free from injury will improve Outcome: Progressing   Problem: Metabolic: Goal: Ability to maintain appropriate glucose levels will improve Outcome: Progressing

## 2022-10-16 NOTE — Progress Notes (Signed)
Nutrition Follow-up  DOCUMENTATION CODES:   Not applicable  INTERVENTION:  - Continue goal tube feeds via G-tube: Jevity 1.5 at 60 ml/h  Provides 2160 kcal, 92 gm protein, 1094 ml free water daily   - Q4H free water flushes + free water from formula provides total of 2253mL/day   - Multivitamin with minerals daily via tube   - Monitor weight trends.    - Monitor for diet advancement.  - Per RNCM, patient to discharge to [facility] facility which does not have Jevity 1.5, likely has Osmolite 1.5. Once patient discharges to facility, can utilize equivalent formula as appropriate. Recommend dietitian follow with patient at facility to determine appropriate TF regimen.   NUTRITION DIAGNOSIS:   Increased nutrient needs related to wound healing as evidenced by estimated needs. *ongoing  GOAL:   Patient will meet greater than or equal to 90% of their needs *met with TF  MONITOR:   Labs, Weight trends, I & O's, TF tolerance, Skin  REASON FOR ASSESSMENT:   Consult Enteral/tube feeding initiation and management  ASSESSMENT:   77 y.o. male with medical history significant of bronchitis due to human metapneumovirus, history ischemic stroke, history of metabolic encephalopathy, pyelonephritis, AKI, community-acquired pneumonia, stage III CKD, hyperlipidemia, hypertension, microalbuminuria, peripheral neuropathy, history of pneumonia, history of sepsis, tobacco abuse, peripheral vascular disease, left eye vision loss who was recently discharged on 09/17/2022 to [facility] after hospitalization for AKI and bacteremia due to [facility] ADIACENS currently on vancomycin until 10/22/2022 per ID recommendations.  While he was at [facility], they placed a PEG.  Unfortunately, the PEG site seems to have developed cellulitis and the patient has become dehydrated and again developed acute kidney injury.  G tube was exchanged by IR 8/7 and tube feeds restarted same day. Advanced to goal of  20mL/hr on 8/10 and patient tolerating well since that time. No documented issues. Confirmed with RN at bedside today tube feeds going well.   Patient continues to be followed by SLP. Last eval yesterday and patient recommended nectar-thick liquids and ice chips provided by RN only and only when awake. As patient's mentation continues to wax and wane, suspect he will continue to require PEG tube feeds to meet nutritional needs.    Admit weight: 189# Current weight: 172# *Weight noted to be decreased from admit however patient -6L which could be affecting weight status. Will need to monitor weight trends closely.  Medications reviewed and include: 220mg  ferrous sulfate, Q4H FWF, MVI  Labs reviewed:  Na 146 HA1C 6.5 Blood Glucose 147-197 x24 hours   Diet Order:   Diet Order             Diet NPO time specified  Diet effective now                   EDUCATION NEEDS:  Not appropriate for education at this time  Skin:  Skin Assessment: Reviewed RN Assessment Skin Integrity Issues:: DTI DTI: bilateral heels, stage 2 sacrum  Last BM:  8/11 - rectal tube  Height:  Ht Readings from Last 1 Encounters:  10/02/22 5\' 9"  (1.753 m)   Weight:  Wt Readings from Last 1 Encounters:  10/16/22 78 kg    BMI:  Body mass index is 25.39 kg/m.  Estimated Nutritional Needs:  Kcal:  2100-2300 Protein:  105-115g Fluid:  2.1L/day    Shelle Iron RD, LDN For contact information, refer to [facility]: Lambert Rhonda W.

## 2022-10-16 NOTE — TOC Transition Note (Addendum)
Transition of Care [facility]) - CM/SW Discharge Note   Patient Details  Name: Harry [name] MRN: [id number] Date of Birth: 10-28-1945  Transition [facility] Care [facility]) CM/SW Contact:  Lavenia Atlas, RN Phone Number: 10/16/2022, 12:59 PM   Clinical Narrative:   Merri Ray with [facility] regarding patient discharge today. Per Lawerance Cruel will need to change feeding formula from Jevity 1.5 to Osmolite 1.5. This RNCM notified [facility] RD and dietician will update note and recommends LTC Facility reassess as patient has tolerated Jevity well while at [facility] and has no indication to change, however if facility does not have Jevity on hand, Osmolite 1.5 is the equivalent.   Notified MD, RN This RNCM spoke with patient's daughter to advise awaiting response from [facility] to set up PT[facility] for transport today.    Awaiting room# and report # to call PTAR.  TOC will continue to follow.    - 1:39pm Report can be called at [phone], room# 505A.  This RNCM will call PTAR, this RNCM will leave PTAR folder with patient chart. Notified RN, MD.   Dan Maker with [facility] to confirm if all lines can remain (peripheral IV, foley cath, flexiseal and Gtube), awaiting response.  TOC will continue to follow.  Final next level of care: Long Term Acute Care (LTAC) Barriers to Discharge: Barriers Resolved   Patient Goals and CMS Choice CMS Medicare.gov Compare Post Acute Care list provided to:: Patient Represenative (must comment) [name] (daughter)) Choice offered to / list presented to : Adult Children  Discharge Placement                Patient chooses bed at: [facility] Patient to be transferred to facility by: PTAR Name of family member notified: [name] (daughter) Patient and family notified of of transfer: 10/16/22  Discharge Plan and Services Additional resources added to the After Visit Summary for   In-house Referral: NA Discharge Planning Services: CM Consult Post Acute Care  Choice: Skilled Nursing Facility          DME Arranged: N/A DME Agency: NA       HH Arranged: NA HH Agency: NA        Social Determinants of Health (SDOH) Interventions SDOH Screenings   Food Insecurity: No Food Insecurity (09/07/2022)  Housing: Patient Unable To Answer (09/07/2022)  Transportation Needs: No Transportation Needs (09/07/2022)  Utilities: Not At Risk (09/07/2022)  Depression (PHQ2-9): Low Risk  (03/01/2020)  Financial Resource Strain: Low Risk  (09/29/2018)  Physical Activity: Inactive (09/29/2018)  Tobacco Use: Medium Risk (10/04/2022)     Readmission Risk Interventions    10/08/2022    1:37 PM 10/05/2022   11:34 AM 09/17/2022   12:21 PM  Readmission Risk Prevention Plan  Transportation Screening Complete Complete Complete  PCP or Specialist Appt within 3-5 Days   Complete  HRI or Home Care Consult   Complete  Social Work Consult for Recovery Care Planning/Counseling   Complete  Palliative Care Screening   Not Applicable  Medication Review [employer]) Complete Complete Complete  PCP or Specialist appointment within 3-5 days of discharge Complete Complete   HRI or Home Care Consult  Complete   SW Recovery Care/Counseling Consult  Complete   Palliative Care Screening  Not Applicable   Skilled Nursing Facility  Complete

## 2022-10-16 NOTE — Progress Notes (Signed)
Report called to [facility] to nurse [place] at [phone] for room 505A. All questions answered. Instructed to remove peripheral IV but leave all other lines.

## 2022-10-20 ENCOUNTER — Other Ambulatory Visit: Payer: Self-pay

## 2022-10-20 ENCOUNTER — Inpatient Hospital Stay (HOSPITAL_COMMUNITY)
Admission: EM | Admit: 2022-10-20 | Discharge: 2022-10-30 | DRG: 291 | Disposition: A | Discharge status: Skilled Nursing Facility | Payer: Medicare Other | Source: Skilled Nursing Facility | Attending: Internal Medicine | Admitting: Internal Medicine

## 2022-10-20 ENCOUNTER — Encounter (HOSPITAL_COMMUNITY): Payer: Self-pay | Admitting: *Deleted

## 2022-10-20 ENCOUNTER — Inpatient Hospital Stay (HOSPITAL_COMMUNITY): Payer: Medicare Other

## 2022-10-20 ENCOUNTER — Emergency Department (HOSPITAL_COMMUNITY): Payer: Medicare Other

## 2022-10-20 DIAGNOSIS — Z66 Do not resuscitate: Secondary | ICD-10-CM | POA: Diagnosis present

## 2022-10-20 DIAGNOSIS — R609 Edema, unspecified: Secondary | ICD-10-CM | POA: Diagnosis not present

## 2022-10-20 DIAGNOSIS — D649 Anemia, unspecified: Secondary | ICD-10-CM | POA: Diagnosis present

## 2022-10-20 DIAGNOSIS — R0603 Acute respiratory distress: Secondary | ICD-10-CM | POA: Diagnosis not present

## 2022-10-20 DIAGNOSIS — I130 Hypertensive heart and chronic kidney disease with heart failure and stage 1 through stage 4 chronic kidney disease, or unspecified chronic kidney disease: Principal | ICD-10-CM | POA: Diagnosis present

## 2022-10-20 DIAGNOSIS — Z794 Long term (current) use of insulin: Secondary | ICD-10-CM | POA: Diagnosis not present

## 2022-10-20 DIAGNOSIS — A041 Enterotoxigenic Escherichia coli infection: Secondary | ICD-10-CM | POA: Diagnosis present

## 2022-10-20 DIAGNOSIS — D6959 Other secondary thrombocytopenia: Secondary | ICD-10-CM | POA: Diagnosis present

## 2022-10-20 DIAGNOSIS — D500 Iron deficiency anemia secondary to blood loss (chronic): Secondary | ICD-10-CM | POA: Diagnosis present

## 2022-10-20 DIAGNOSIS — E11649 Type 2 diabetes mellitus with hypoglycemia without coma: Secondary | ICD-10-CM | POA: Diagnosis not present

## 2022-10-20 DIAGNOSIS — G9341 Metabolic encephalopathy: Secondary | ICD-10-CM | POA: Diagnosis present

## 2022-10-20 DIAGNOSIS — J690 Pneumonitis due to inhalation of food and vomit: Secondary | ICD-10-CM | POA: Diagnosis present

## 2022-10-20 DIAGNOSIS — Z7984 Long term (current) use of oral hypoglycemic drugs: Secondary | ICD-10-CM

## 2022-10-20 DIAGNOSIS — K922 Gastrointestinal hemorrhage, unspecified: Secondary | ICD-10-CM | POA: Diagnosis present

## 2022-10-20 DIAGNOSIS — R54 Age-related physical debility: Secondary | ICD-10-CM | POA: Diagnosis present

## 2022-10-20 DIAGNOSIS — J189 Pneumonia, unspecified organism: Secondary | ICD-10-CM | POA: Diagnosis not present

## 2022-10-20 DIAGNOSIS — E1169 Type 2 diabetes mellitus with other specified complication: Secondary | ICD-10-CM | POA: Diagnosis present

## 2022-10-20 DIAGNOSIS — J9811 Atelectasis: Secondary | ICD-10-CM | POA: Diagnosis not present

## 2022-10-20 DIAGNOSIS — Z1152 Encounter for screening for COVID-19: Secondary | ICD-10-CM

## 2022-10-20 DIAGNOSIS — E785 Hyperlipidemia, unspecified: Secondary | ICD-10-CM | POA: Diagnosis present

## 2022-10-20 DIAGNOSIS — N1832 Chronic kidney disease, stage 3b: Secondary | ICD-10-CM | POA: Diagnosis present

## 2022-10-20 DIAGNOSIS — K9423 Gastrostomy malfunction: Secondary | ICD-10-CM | POA: Diagnosis not present

## 2022-10-20 DIAGNOSIS — Z7189 Other specified counseling: Secondary | ICD-10-CM

## 2022-10-20 DIAGNOSIS — F0390 Unspecified dementia without behavioral disturbance: Secondary | ICD-10-CM | POA: Diagnosis not present

## 2022-10-20 DIAGNOSIS — Z515 Encounter for palliative care: Secondary | ICD-10-CM

## 2022-10-20 DIAGNOSIS — R1310 Dysphagia, unspecified: Secondary | ICD-10-CM | POA: Diagnosis present

## 2022-10-20 DIAGNOSIS — F02C18 Dementia in other diseases classified elsewhere, severe, with other behavioral disturbance: Secondary | ICD-10-CM | POA: Diagnosis not present

## 2022-10-20 DIAGNOSIS — J90 Pleural effusion, not elsewhere classified: Secondary | ICD-10-CM | POA: Diagnosis present

## 2022-10-20 DIAGNOSIS — I69398 Other sequelae of cerebral infarction: Secondary | ICD-10-CM

## 2022-10-20 DIAGNOSIS — I5033 Acute on chronic diastolic (congestive) heart failure: Secondary | ICD-10-CM | POA: Diagnosis present

## 2022-10-20 DIAGNOSIS — L89152 Pressure ulcer of sacral region, stage 2: Secondary | ICD-10-CM | POA: Diagnosis present

## 2022-10-20 DIAGNOSIS — L89616 Pressure-induced deep tissue damage of right heel: Secondary | ICD-10-CM | POA: Diagnosis present

## 2022-10-20 DIAGNOSIS — Z931 Gastrostomy status: Secondary | ICD-10-CM | POA: Diagnosis not present

## 2022-10-20 DIAGNOSIS — Z7902 Long term (current) use of antithrombotics/antiplatelets: Secondary | ICD-10-CM

## 2022-10-20 DIAGNOSIS — R4589 Other symptoms and signs involving emotional state: Secondary | ICD-10-CM | POA: Diagnosis not present

## 2022-10-20 DIAGNOSIS — Z7982 Long term (current) use of aspirin: Secondary | ICD-10-CM

## 2022-10-20 DIAGNOSIS — I10 Essential (primary) hypertension: Secondary | ICD-10-CM | POA: Diagnosis not present

## 2022-10-20 DIAGNOSIS — T17998A Other foreign object in respiratory tract, part unspecified causing other injury, initial encounter: Secondary | ICD-10-CM | POA: Diagnosis not present

## 2022-10-20 DIAGNOSIS — Z87891 Personal history of nicotine dependence: Secondary | ICD-10-CM

## 2022-10-20 DIAGNOSIS — Z79899 Other long term (current) drug therapy: Secondary | ICD-10-CM

## 2022-10-20 DIAGNOSIS — F01518 Vascular dementia, unspecified severity, with other behavioral disturbance: Secondary | ICD-10-CM | POA: Diagnosis present

## 2022-10-20 DIAGNOSIS — T17500A Unspecified foreign body in bronchus causing asphyxiation, initial encounter: Secondary | ICD-10-CM | POA: Diagnosis not present

## 2022-10-20 DIAGNOSIS — I129 Hypertensive chronic kidney disease with stage 1 through stage 4 chronic kidney disease, or unspecified chronic kidney disease: Secondary | ICD-10-CM | POA: Diagnosis present

## 2022-10-20 DIAGNOSIS — F02818 Dementia in other diseases classified elsewhere, unspecified severity, with other behavioral disturbance: Secondary | ICD-10-CM | POA: Diagnosis present

## 2022-10-20 DIAGNOSIS — D631 Anemia in chronic kidney disease: Secondary | ICD-10-CM | POA: Diagnosis present

## 2022-10-20 DIAGNOSIS — T17908A Unspecified foreign body in respiratory tract, part unspecified causing other injury, initial encounter: Secondary | ICD-10-CM | POA: Diagnosis not present

## 2022-10-20 DIAGNOSIS — E1165 Type 2 diabetes mellitus with hyperglycemia: Secondary | ICD-10-CM | POA: Diagnosis present

## 2022-10-20 DIAGNOSIS — J9601 Acute respiratory failure with hypoxia: Secondary | ICD-10-CM | POA: Diagnosis present

## 2022-10-20 DIAGNOSIS — I509 Heart failure, unspecified: Secondary | ICD-10-CM | POA: Diagnosis not present

## 2022-10-20 DIAGNOSIS — Z888 Allergy status to other drugs, medicaments and biological substances status: Secondary | ICD-10-CM

## 2022-10-20 DIAGNOSIS — Z8673 Personal history of transient ischemic attack (TIA), and cerebral infarction without residual deficits: Secondary | ICD-10-CM

## 2022-10-20 DIAGNOSIS — Z833 Family history of diabetes mellitus: Secondary | ICD-10-CM

## 2022-10-20 DIAGNOSIS — Z8249 Family history of ischemic heart disease and other diseases of the circulatory system: Secondary | ICD-10-CM

## 2022-10-20 DIAGNOSIS — F0280 Dementia in other diseases classified elsewhere without behavioral disturbance: Secondary | ICD-10-CM | POA: Diagnosis not present

## 2022-10-20 DIAGNOSIS — E1122 Type 2 diabetes mellitus with diabetic chronic kidney disease: Secondary | ICD-10-CM | POA: Diagnosis present

## 2022-10-20 DIAGNOSIS — G3183 Dementia with Lewy bodies: Secondary | ICD-10-CM | POA: Diagnosis not present

## 2022-10-20 DIAGNOSIS — Z9841 Cataract extraction status, right eye: Secondary | ICD-10-CM

## 2022-10-20 DIAGNOSIS — I5031 Acute diastolic (congestive) heart failure: Secondary | ICD-10-CM | POA: Diagnosis not present

## 2022-10-20 DIAGNOSIS — R197 Diarrhea, unspecified: Secondary | ICD-10-CM | POA: Diagnosis present

## 2022-10-20 DIAGNOSIS — R339 Retention of urine, unspecified: Secondary | ICD-10-CM | POA: Diagnosis present

## 2022-10-20 DIAGNOSIS — R195 Other fecal abnormalities: Secondary | ICD-10-CM | POA: Diagnosis not present

## 2022-10-20 DIAGNOSIS — E162 Hypoglycemia, unspecified: Secondary | ICD-10-CM | POA: Diagnosis not present

## 2022-10-20 DIAGNOSIS — Z9842 Cataract extraction status, left eye: Secondary | ICD-10-CM

## 2022-10-20 DIAGNOSIS — Y95 Nosocomial condition: Secondary | ICD-10-CM | POA: Diagnosis not present

## 2022-10-20 DIAGNOSIS — Z7401 Bed confinement status: Secondary | ICD-10-CM

## 2022-10-20 DIAGNOSIS — F17210 Nicotine dependence, cigarettes, uncomplicated: Secondary | ICD-10-CM | POA: Diagnosis present

## 2022-10-20 DIAGNOSIS — E778 Other disorders of glycoprotein metabolism: Secondary | ICD-10-CM | POA: Diagnosis not present

## 2022-10-20 LAB — COMPREHENSIVE METABOLIC PANEL WITH GFR
ALT: 31 U/L (ref 0–44)
AST: 26 U/L (ref 15–41)
Albumin: 2.6 g/dL — ABNORMAL LOW (ref 3.5–5.0)
Alkaline Phosphatase: 71 U/L (ref 38–126)
Anion gap: 10 (ref 5–15)
BUN: 38 mg/dL — ABNORMAL HIGH (ref 8–23)
CO2: 26 mmol/L (ref 22–32)
Calcium: 8.5 mg/dL — ABNORMAL LOW (ref 8.9–10.3)
Chloride: 105 mmol/L (ref 98–111)
Creatinine, Ser: 0.88 mg/dL (ref 0.61–1.24)
GFR, Estimated: >60 mL/min
Glucose, Bld: 104 mg/dL — ABNORMAL HIGH (ref 70–99)
Potassium: 4.5 mmol/L (ref 3.5–5.1)
Sodium: 141 mmol/L (ref 135–145)
Total Bilirubin: 0.6 mg/dL (ref 0.3–1.2)
Total Protein: 6.5 g/dL (ref 6.5–8.1)

## 2022-10-20 LAB — BRAIN NATRIURETIC PEPTIDE: B Natriuretic Peptide: 211.4 pg/mL — ABNORMAL HIGH (ref 0.0–100.0)

## 2022-10-20 LAB — HEMOGLOBIN AND HEMATOCRIT, BLOOD
HCT: 24.4 % — ABNORMAL LOW (ref 39.0–52.0)
Hemoglobin: 7.7 g/dL — ABNORMAL LOW (ref 13.0–17.0)

## 2022-10-20 LAB — PREPARE RBC (CROSSMATCH)

## 2022-10-20 LAB — BLOOD GAS, VENOUS
Acid-Base Excess: 8.3 mmol/L — ABNORMAL HIGH (ref 0.0–2.0)
Bicarbonate: 32.8 mmol/L — ABNORMAL HIGH (ref 20.0–28.0)
O2 Saturation: 96.2 %
Patient temperature: 37
pCO2, Ven: 45 mmHg (ref 44–60)
pH, Ven: 7.47 — ABNORMAL HIGH (ref 7.25–7.43)
pO2, Ven: 65 mmHg — ABNORMAL HIGH (ref 32–45)

## 2022-10-20 LAB — CBC
HCT: 21.5 % — ABNORMAL LOW (ref 39.0–52.0)
Hemoglobin: 6.8 g/dL — CL (ref 13.0–17.0)
MCH: 27.9 pg (ref 26.0–34.0)
MCHC: 31.6 g/dL (ref 30.0–36.0)
MCV: 88.1 fL (ref 80.0–100.0)
Platelets: 123 K/uL — ABNORMAL LOW (ref 150–400)
RBC: 2.44 MIL/uL — ABNORMAL LOW (ref 4.22–5.81)
RDW: 17.8 % — ABNORMAL HIGH (ref 11.5–15.5)
WBC: 6.5 K/uL (ref 4.0–10.5)
nRBC: 0 % (ref 0.0–0.2)

## 2022-10-20 LAB — RESP PANEL BY RT-PCR (RSV, FLU A&B, COVID)  RVPGX2
Influenza A by PCR: NEGATIVE
Influenza B by PCR: NEGATIVE
Resp Syncytial Virus by PCR: NEGATIVE
SARS Coronavirus 2 by RT PCR: NEGATIVE

## 2022-10-20 LAB — PROCALCITONIN: Procalcitonin: 0.16 ng/mL

## 2022-10-20 LAB — POC OCCULT BLOOD, ED: Fecal Occult Bld: POSITIVE — AB

## 2022-10-20 LAB — D-DIMER, QUANTITATIVE: D-Dimer, Quant: 0.95 ug{FEU}/mL — ABNORMAL HIGH (ref 0.00–0.50)

## 2022-10-20 MED ORDER — ACETAMINOPHEN 325 MG PO TABS
650.0000 mg | ORAL_TABLET | Freq: Four times a day (QID) | ORAL | Status: DC | PRN
  Administered 2022-10-22 – 2022-10-24 (×4): 650 mg
  Filled 2022-10-20 (×4): qty 2

## 2022-10-20 MED ORDER — SODIUM CHLORIDE (PF) 0.9 % IJ SOLN
INTRAMUSCULAR | Status: AC
  Filled 2022-10-20: qty 50

## 2022-10-20 MED ORDER — IOHEXOL 9 MG/ML PO SOLN
500.0000 mL | ORAL | Status: AC
  Administered 2022-10-20 – 2022-10-21 (×2): 500 mL via ORAL

## 2022-10-20 MED ORDER — SODIUM CHLORIDE 0.9 % IV SOLN: INTRAVENOUS | Status: DC

## 2022-10-20 MED ORDER — ARTIFICIAL TEARS OPHTHALMIC OINT
1.0000 | TOPICAL_OINTMENT | Freq: Three times a day (TID) | OPHTHALMIC | Status: DC
  Administered 2022-10-20 – 2022-10-30 (×29): 1 via OPHTHALMIC
  Filled 2022-10-20 (×2): qty 3.5

## 2022-10-20 MED ORDER — SERTRALINE HCL 50 MG PO TABS
25.0000 mg | ORAL_TABLET | Freq: Every day | ORAL | Status: DC
  Administered 2022-10-21 – 2022-10-30 (×11): 25 mg
  Filled 2022-10-20 (×10): qty 1

## 2022-10-20 MED ORDER — PYRIDOSTIGMINE BROMIDE 60 MG PO TABS
30.0000 mg | ORAL_TABLET | Freq: Three times a day (TID) | ORAL | Status: DC
  Administered 2022-10-21 – 2022-10-30 (×29): 30 mg
  Filled 2022-10-20 (×33): qty 0.5

## 2022-10-20 MED ORDER — ONDANSETRON HCL 4 MG/2ML IJ SOLN: 4.0000 mg | Freq: Four times a day (QID) | INTRAMUSCULAR | Status: DC | PRN

## 2022-10-20 MED ORDER — ONDANSETRON HCL 4 MG PO TABS: 4.0000 mg | ORAL_TABLET | Freq: Four times a day (QID) | ORAL | Status: DC | PRN

## 2022-10-20 MED ORDER — ACETAMINOPHEN 650 MG RE SUPP: 650.0000 mg | Freq: Four times a day (QID) | RECTAL | Status: DC | PRN

## 2022-10-20 MED ORDER — IOHEXOL 350 MG/ML SOLN
75.0000 mL | Freq: Once | INTRAVENOUS | Status: AC | PRN
  Administered 2022-10-21: 100 mL via INTRAVENOUS

## 2022-10-20 MED ORDER — PANTOPRAZOLE SODIUM 40 MG IV SOLR
40.0000 mg | Freq: Two times a day (BID) | INTRAVENOUS | Status: DC
  Administered 2022-10-20 – 2022-10-25 (×10): 40 mg via INTRAVENOUS
  Filled 2022-10-20 (×11): qty 10

## 2022-10-20 MED ORDER — IOHEXOL 9 MG/ML PO SOLN
ORAL | Status: AC
  Filled 2022-10-20: qty 1000

## 2022-10-20 MED ORDER — SODIUM CHLORIDE 0.9% IV SOLUTION: Freq: Once | INTRAVENOUS | Status: AC

## 2022-10-20 MED ORDER — LINEZOLID 600 MG PO TABS
600.0000 mg | ORAL_TABLET | Freq: Two times a day (BID) | ORAL | Status: AC
  Administered 2022-10-21 – 2022-10-22 (×4): 600 mg
  Filled 2022-10-20 (×6): qty 1

## 2022-10-20 MED ORDER — HYDRALAZINE HCL 25 MG PO TABS
25.0000 mg | ORAL_TABLET | Freq: Three times a day (TID) | ORAL | Status: DC
  Administered 2022-10-21 – 2022-10-30 (×28): 25 mg
  Filled 2022-10-20 (×28): qty 1

## 2022-10-20 MED ORDER — ROSUVASTATIN CALCIUM 5 MG PO TABS
5.0000 mg | ORAL_TABLET | Freq: Every day | ORAL | Status: DC
  Administered 2022-10-21 – 2022-10-29 (×9): 5 mg
  Filled 2022-10-20 (×9): qty 1

## 2022-10-20 NOTE — ED Triage Notes (Signed)
BIB EMS from [facility]. Pt moaning and groaning, recently dx with Pneumonia. Sats 88% this AM. EMS placed  Pt on 2L 02 upon arrival. Crackles throughout. Hospital acquired. Pt was at [facility] then transported to [facility]. Pt has dementia and will not keep 02 on. 160/80-94-CBG 180 97% 2 L

## 2022-10-20 NOTE — ED Provider Notes (Signed)
[place] EMERGENCY DEPARTMENT AT [facility] Provider Note   CSN: [id number] Arrival date & time: 10/20/22  1047     History  No chief complaint on file.   Harry [name] is a 77 y.o. male with PMHx stroke, CKD, HLD, HTN, PVD, DM who presents to ED from [facility] concerned for hypoxia and PNA. Patient not on O2 at baseline. Patient discharged 5 days ago from hospital for AKI.  Attempted to call nursing staff at [facility] (x1) without response. Called again and spoke to [employer] who was concerned for breath sounds, checked O2 which was 79% on RA. Rest of vitals reassuring.   HPI     Home Medications Prior to Admission medications   Medication Sig Start Date End Date Taking? Authorizing Provider  amLODipine (NORVASC) 10 MG tablet Place 1 tablet (10 mg total) into feeding tube daily. 10/17/22  Yes Briant Cedar, MD  carvedilol (COREG) 12.5 MG tablet Take 1 tablet (12.5 mg total) by mouth 2 (two) times daily with a meal. Patient taking differently: Place 12.5 mg into feeding tube 2 (two) times daily with a meal. 06/07/20  Yes Lewie Chamber, MD  clopidogrel (PLAVIX) 75 MG tablet Place 75 mg into feeding tube daily.   Yes [provider]  CREAM BASE EX Apply 1 Application topically daily. Apply minerin cream to feet   Yes [provider]  ferrous gluconate (FERGON) 324 MG tablet 324 mg at bedtime. Per tube   Yes [provider]  hydrALAZINE (APRESOLINE) 25 MG tablet Place 1 tablet (25 mg total) into feeding tube every 8 (eight) hours. 10/16/22  Yes Briant Cedar, MD  insulin glargine (LANTUS SOLOSTAR) 100 UNIT/ML Solostar Pen Inject 13 Units into the skin daily.   Yes [provider]  insulin lispro (HUMALOG) 100 UNIT/ML injection Inject 0-12 Units into the skin See admin instructions. Inject 0-12 units three times daily before meals per sliding scale: BG < 70, call NP/PA. BG 70 - 200 : 0  units BG 201-250 : 2 units BG 251-300 : 4 units BG 301-350 : 6 units BG 351-400 : 8 units BG 401-450 : 10 units BG 451-500 : 12 units BG > 500 : 12 units, recheck in 2 hours. If still > 350 or < 100 notify provider BG is 451 to 600 give 12 units.   Yes [provider]  linezolid (ZYVOX) 600 MG tablet Place 1 tablet (600 mg total) into feeding tube every 12 (twelve) hours for 9 doses. 10/17/22 10/22/22 Yes Briant Cedar, MD  Multiple Vitamin (MULTIVITAMIN) LIQD Place 15 mLs into feeding tube daily. 10/17/22  Yes Briant Cedar, MD  Nutritional Supplements (FEEDING SUPPLEMENT, OSMOLITE 1.5 CAL,) LIQD Place into feeding tube continuous. 60 ml/hr   Yes [provider]  Nutritional Supplements (FEEDING SUPPLEMENT, PROSOURCE TF,) liquid Place 45 mLs into feeding tube 2 (two) times daily.   Yes [provider]  nystatin (MYCOSTATIN) 100000 UNIT/ML suspension Take 15 mLs by mouth in the morning, at noon, and at bedtime. Swish and spit   Yes [provider]  Polyethyl Glycol-Propyl Glycol (GENTEAL TEARS SEVERE DAY/NIGHT) 0.4-0.3 % GEL ophthalmic gel Place 1 Application into both eyes 3 (three) times daily.   Yes [provider]  pyridostigmine (MESTINON) 60 MG tablet Take 0.5 tablets (30 mg total) by mouth every 8 (eight) hours. Patient taking differently: Place 30 mg into feeding tube every 8 (eight) hours. 10/30/19  Yes  Kathlen Mody, MD  rosuvastatin (CRESTOR) 5 MG tablet Place 5 mg into feeding tube at bedtime.   Yes [provider]  sertraline (ZOLOFT) 25 MG tablet Place 25 mg into feeding tube daily.   Yes [provider]  tamsulosin (FLOMAX) 0.4 MG CAPS capsule Take 1 capsule (0.4 mg total) by mouth daily. Patient taking differently: 0.4 mg daily. Per tube 06/06/22  Yes Ghimire, Lyndel Safe, MD  Water For Irrigation, Sterile (FREE WATER) SOLN Place 200 mLs into feeding tube every 4 (four) hours. 10/16/22  Yes Briant Cedar, MD   leptospermum manuka honey (MEDIHONEY) PSTE paste Apply 1 Application topically daily. Patient not taking: Reported on 10/20/2022 10/17/22   Briant Cedar, MD  magic mouthwash SOLN Take 15 mLs by mouth 3 (three) times daily. Patient not taking: Reported on 10/20/2022 10/16/22   Briant Cedar, MD      Allergies    Lipitor [atorvastatin], Statins, and Pravachol [pravastatin]    Review of Systems   Review of Systems  Respiratory:         PNA    Physical Exam Updated Vital Signs BP 137/71   Pulse 87   Temp (!) 97.2 F (36.2 C) (Oral)   Resp 18   Wt 78 kg   SpO2 95%   BMI 25.39 kg/m  Physical Exam Vitals and nursing note reviewed.  Constitutional:      General: He is not in acute distress.    Appearance: He is not ill-appearing or toxic-appearing.  HENT:     Head: Normocephalic and atraumatic.     Mouth/Throat:     Mouth: Mucous membranes are moist.  Eyes:     General: No scleral icterus.       Right eye: No discharge.        Left eye: No discharge.     Conjunctiva/sclera: Conjunctivae normal.  Cardiovascular:     Rate and Rhythm: Normal rate and regular rhythm.     Pulses: Normal pulses.     Heart sounds: Normal heart sounds. No murmur heard.    Comments: +2 radial and pedal pulses. No pitting edema. Pulmonary:     Effort: Pulmonary effort is normal.     Comments: Diffuse coarse rhonchi mixed with upper airway secretion sounds. Abdominal:     General: Abdomen is flat. Bowel sounds are normal. There is no distension.     Palpations: Abdomen is soft. There is no mass.     Tenderness: There is no abdominal tenderness.  Musculoskeletal:     Right lower leg: No edema.     Left lower leg: No edema.  Skin:    General: Skin is warm and dry.     Findings: No rash.  Neurological:     General: No focal deficit present.     Mental Status: He is alert. Mental status is at baseline.     Comments: AAOx2. Pleasantly demented.  Psychiatric:        Mood and Affect:  Mood normal.     ED Results / Procedures / Treatments   Labs (all labs ordered are listed, but only abnormal results are displayed) Labs Reviewed  COMPREHENSIVE METABOLIC PANEL - Abnormal; Notable for the following components:      Result Value   Glucose, Bld 104 (*)    BUN 38 (*)    Calcium 8.5 (*)    Albumin 2.6 (*)    All other components within normal limits  CBC - Abnormal; Notable for the following  components:   RBC 2.44 (*)    Hemoglobin 6.8 (*)    HCT 21.5 (*)    RDW 17.8 (*)    Platelets 123 (*)    All other components within normal limits  POC OCCULT BLOOD, ED - Abnormal; Notable for the following components:   Fecal Occult Bld POSITIVE (*)    All other components within normal limits  RESP PANEL BY RT-PCR (RSV, FLU A&B, COVID)  RVPGX2  BLOOD GAS, VENOUS  TYPE AND SCREEN  PREPARE RBC (CROSSMATCH)    EKG EKG Interpretation Date/Time:  Saturday October 20 2022 11:05:41 EDT Ventricular Rate:  87 PR Interval:  197 QRS Duration:  92 QT Interval:  408 QTC Calculation: 491 R Axis:   -18  Text Interpretation: Sinus rhythm Borderline left axis deviation Anterior infarct, old Confirmed by Alona Bene [id number]) on 10/20/2022 11:23:01 AM  Radiology DG Chest 2 View  Result Date: 10/20/2022 CLINICAL DATA:  Cough.  Recently diagnosed with pneumonia. EXAM: CHEST - 2 VIEW COMPARISON:  10/02/2022, CT abdomen 10/08/2022 FINDINGS: Lungs are adequately inflated demonstrate hazy left base/retrocardiac opacification likely due to small effusion with possible associated atelectasis. Hazy opacification over the right infrahilar region which may be due to asymmetric edema versus infection. Overall interval improved bibasilar opacification likely improved effusions. Cardiomediastinal silhouette and remainder of the exam is unchanged. IMPRESSION: 1. Improved hazy left base/retrocardiac opacification likely due to small effusion with possible associated atelectasis. 2. Improved hazy  opacification over the right infrahilar region which may be due to asymmetric edema, atelectasis or infection. Electronically Signed   By: Elberta Fortis M.D.   On: 10/20/2022 11:41    Procedures Procedures    Medications Ordered in ED Medications  0.9 %  sodium chloride infusion (Manually program via Guardrails IV Fluids) (has no administration in time range)    ED Course/ Medical Decision Making/ A&P                                 Medical Decision Making Amount and/or Complexity of Data Reviewed Labs: ordered. Radiology: ordered.   This patient presents to the ED for concern of shortness of breath, this involves an extensive number of treatment options, and is a complaint that carries with it a high risk of complications and morbidity.  The differential diagnosis includes Anxiety, Anaphylaxis/Angioedema, Aspirated FB, Arrhythmia, CHF, Asthma, COPD, PNA, COVID/Flu/RSV, STEMI, Tamponade, TPNX, DKA, Sepsis, Toxin   Co morbidities that complicate the patient evaluation  stroke, CKD, HLD, HTN, PVD, DM    Additional history obtained:  Attempted to call [facility] to talk to pt nurse (x1) - no response.   Lab Tests:  I Ordered, and personally interpreted labs.  The pertinent results include:   - CMP: BUN elevated (38) - CBC: anemia (6.8) requiring blood transfusion - Respiratory Panel: negative   Imaging Studies ordered:  I ordered imaging studies including  -chest xray: to assess for process contributing to patient's symptoms  I independently visualized and interpreted imaging I agree with the radiologist interpretation   Cardiac Monitoring: / EKG:  The patient was maintained on a cardiac monitor.  I personally viewed and interpreted the cardiac monitored which showed an underlying rhythm of: sinus rhythm without acute ST changes or arrhythmias   Consultations Obtained:  I requested consultation with Hospitalist Dr. Dayna Barker,  and discussed lab  and imaging findings as well as pertinent plan - they agree to  admit patient   Problem List / ED Course / Critical interventions / Medication management  Admitting patient for hypoxia and anemia requiring transfusion. Patient presenting to ED concerned for hypoxia (79% on RA at nursing home), AMS and PNA given tachypnea and lung sounds. Patient was discharged around 4 days ago for AKI. Patient also seen by GI for anemia requiring transfusion - they did not perform EGD stating that it wasn't necessary at this time. Since discharge, patient has had AMS with increased combativeness and increased drowsiness.  Physical exam showing PEG tube without concern for surrounding infection, foley catheter, and rectal bag. Rectal bag without obvious blood present. Patient tachypnic at 22 BPM. Os sat >96% on 2L [place] that starts to drop into low 90's on RA. No fever, tachycardia, or hypotension. Patient AAOx2 but appears drowsy. CBC with hbg at 6.8 which is a quick drop from 8.7 six days ago. Obtained type and screen and starting transfusion process. Resp panel negative. Fecal occult positive. Chest xray showing improvement of retrocardiac effusion and improvement of hazy opacification over the right infrahilar region. Per chart  review, patient was discharged with Plavix once daily - tried to talk with nurse to confirm compliance but unable to reach nurse. Given patient's quickly dropping hgb and oxygen requirement, I believe it is in his best interest for inpatient admission at this time. Dr. Dayna Barker admitting provider.  I have reviewed the patients home medicines and have made adjustments as needed   DDx: These are considered less likely due to history of present illness and physical exam findings Anaphylaxis/Angioedema: Aspirated FB: no history of choking Arrhythmia/STEMI: EKG reassuring Tamponade: chest xray without concern CHF: no physical exam findings TPNX: Lungs clear to auscultation bilaterally Sepsis:  afebrile and other vital signs stable   Social Determinants of Health:  none           Final Clinical Impression(s) / ED Diagnoses Final diagnoses:  Anemia requiring transfusions    Rx / DC Orders ED Discharge Orders     None         Dorthy Cooler, [place] 10/20/22 1450    Maia Plan, MD 10/22/22 [phone]
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
## 2022-10-20 NOTE — Hospital Course (Signed)
77 year old male with history of Lewy body dementia,tongue twitching,history of CVA, tongue twitching CKD 3B dysphagia with PEG tube in place, anasarca chronic anemia hyperlipidemia diabetes hypertension brought to the ED from [facility] with moaning and groaning and with increased agitation,recently diagnosed with pneumonia and she was saturating 88% this morning they are placed on 2 L nasal cannula by EMS.Recently discharged on 8/13 where she was hospitalized from 10/02/22> treated for AKI cellulitis around the PEG tube site, aspiration pneumonia lethargy with oxygen and waning mentation cognitive disease.palliative was consulted-but daughter declined any discussion with palliative care.On lat admission GI had seen for anemia. Had 1 unit prbc on 10/02/22. In the ED: Vitals temp 98.2 heart rate 83 respiration 18-22, BP 151/79, 100% oxygen saturation. Labs showed CMP with BUN 38 creatinine 0.8 normal LFTs, hemoglobin 6.8 platelet 123.  Recent hemoglobin 8.0 on 8/13 platelet 211-previously hemoglobin 8 to 9 g.Rectal bag w/ no blood FOBT+.In ED AAOX2 but drowasy apparently new for him. Resp panel neg.  CXR- w/ improved left hazy base/retrocardiac opacification likely due to small effusion with possible associated atelectasis improved hazy opacification of the right infrahilar region. 1 unit prbc ordered and admission requested.

## 2022-10-20 NOTE — ED Notes (Signed)
[place], [place] notified of patient's Hgb at 6.8

## 2022-10-20 NOTE — ED Notes (Signed)
Left a message for pts daughter to return my call.  

## 2022-10-20 NOTE — ED Notes (Signed)
Waiting of pt's daughter to sign blood consent per provider

## 2022-10-20 NOTE — ED Notes (Signed)
Daughter updated by phone.

## 2022-10-20 NOTE — Plan of Care (Signed)
  Problem: Nutrition: Goal: Adequate nutrition will be maintained Outcome: Not Progressing   
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
## 2022-10-21 ENCOUNTER — Inpatient Hospital Stay (HOSPITAL_COMMUNITY): Payer: Medicare Other

## 2022-10-21 DIAGNOSIS — D649 Anemia, unspecified: Principal | ICD-10-CM

## 2022-10-21 DIAGNOSIS — R4589 Other symptoms and signs involving emotional state: Secondary | ICD-10-CM | POA: Diagnosis not present

## 2022-10-21 DIAGNOSIS — F0390 Unspecified dementia without behavioral disturbance: Secondary | ICD-10-CM

## 2022-10-21 DIAGNOSIS — Z515 Encounter for palliative care: Secondary | ICD-10-CM

## 2022-10-21 DIAGNOSIS — Z7189 Other specified counseling: Secondary | ICD-10-CM

## 2022-10-21 DIAGNOSIS — Z931 Gastrostomy status: Secondary | ICD-10-CM

## 2022-10-21 DIAGNOSIS — J690 Pneumonitis due to inhalation of food and vomit: Secondary | ICD-10-CM

## 2022-10-21 LAB — GLUCOSE, CAPILLARY
Glucose-Capillary: 120 mg/dL — ABNORMAL HIGH (ref 70–99)
Glucose-Capillary: 129 mg/dL — ABNORMAL HIGH (ref 70–99)
Glucose-Capillary: 163 mg/dL — ABNORMAL HIGH (ref 70–99)
Glucose-Capillary: 184 mg/dL — ABNORMAL HIGH (ref 70–99)
Glucose-Capillary: 27 mg/dL — CL (ref 70–99)
Glucose-Capillary: 72 mg/dL (ref 70–99)

## 2022-10-21 LAB — HEMOGLOBIN AND HEMATOCRIT, BLOOD
HCT: 24.9 % — ABNORMAL LOW (ref 39.0–52.0)
Hemoglobin: 7.8 g/dL — ABNORMAL LOW (ref 13.0–17.0)

## 2022-10-21 LAB — CBC
HCT: 26.3 % — ABNORMAL LOW (ref 39.0–52.0)
Hemoglobin: 8.3 g/dL — ABNORMAL LOW (ref 13.0–17.0)
MCH: 27.8 pg (ref 26.0–34.0)
MCHC: 31.6 g/dL (ref 30.0–36.0)
MCV: 88 fL (ref 80.0–100.0)
Platelets: 127 10*3/uL — ABNORMAL LOW (ref 150–400)
RBC: 2.99 MIL/uL — ABNORMAL LOW (ref 4.22–5.81)
RDW: 17.2 % — ABNORMAL HIGH (ref 11.5–15.5)
WBC: 7.7 10*3/uL (ref 4.0–10.5)
nRBC: 0 % (ref 0.0–0.2)

## 2022-10-21 LAB — BASIC METABOLIC PANEL
Anion gap: 8 (ref 5–15)
BUN: 32 mg/dL — ABNORMAL HIGH (ref 8–23)
CO2: 27 mmol/L (ref 22–32)
Calcium: 8.4 mg/dL — ABNORMAL LOW (ref 8.9–10.3)
Chloride: 104 mmol/L (ref 98–111)
Creatinine, Ser: 0.92 mg/dL (ref 0.61–1.24)
GFR, Estimated: >60 mL/min (ref >60)
Glucose, Bld: 27 mg/dL — CL (ref 70–99)
Potassium: 4 mmol/L (ref 3.5–5.1)
Sodium: 139 mmol/L (ref 135–145)

## 2022-10-21 LAB — C DIFFICILE QUICK SCREEN W PCR REFLEX
C Diff antigen: NEGATIVE
C Diff interpretation: NOT DETECTED
C Diff toxin: NEGATIVE

## 2022-10-21 LAB — MRSA NEXT GEN BY PCR, NASAL: MRSA by PCR Next Gen: NOT DETECTED

## 2022-10-21 LAB — PROCALCITONIN: Procalcitonin: 0.14 ng/mL

## 2022-10-21 MED ORDER — CHLORHEXIDINE GLUCONATE CLOTH 2 % EX PADS
6.0000 | MEDICATED_PAD | Freq: Every day | CUTANEOUS | Status: DC
  Administered 2022-10-21 – 2022-10-30 (×10): 6 via TOPICAL

## 2022-10-21 MED ORDER — DEXTROSE 50 % IV SOLN
INTRAVENOUS | Status: AC
  Administered 2022-10-21: 50 mL
  Filled 2022-10-21: qty 50

## 2022-10-21 MED ORDER — DEXTROSE 50 % IV SOLN: 25.0000 g | INTRAVENOUS | Status: AC

## 2022-10-21 MED ORDER — FREE WATER
150.0000 mL | Status: DC
  Administered 2022-10-21 – 2022-10-30 (×52): 150 mL

## 2022-10-21 MED ORDER — DEXTROSE-SODIUM CHLORIDE 5-0.9 % IV SOLN: INTRAVENOUS | Status: DC

## 2022-10-21 MED ORDER — BANATROL TF EN LIQD
60.0000 mL | Freq: Three times a day (TID) | ENTERAL | Status: DC
  Administered 2022-10-21 (×2): 60 mL
  Filled 2022-10-21 (×4): qty 60

## 2022-10-21 MED ORDER — JEVITY 1.5 CAL/FIBER PO LIQD
1000.0000 mL | ORAL | Status: DC
  Administered 2022-10-21 – 2022-10-22 (×4): 1000 mL
  Filled 2022-10-21 (×2): qty 1000

## 2022-10-21 MED ORDER — SODIUM CHLORIDE 0.9 % IV SOLN: 2.0000 g | Freq: Three times a day (TID) | INTRAVENOUS | Status: DC

## 2022-10-21 MED ORDER — PROSOURCE TF20 ENFIT COMPATIBL EN LIQD
60.0000 mL | Freq: Every day | ENTERAL | Status: DC
  Administered 2022-10-21 – 2022-10-30 (×10): 60 mL
  Filled 2022-10-21 (×10): qty 60

## 2022-10-21 MED ORDER — PIPERACILLIN-TAZOBACTAM 3.375 G IVPB
3.3750 g | Freq: Three times a day (TID) | INTRAVENOUS | Status: AC
  Administered 2022-10-21 – 2022-10-28 (×23): 3.375 g via INTRAVENOUS
  Filled 2022-10-21 (×23): qty 50

## 2022-10-21 NOTE — Progress Notes (Addendum)
Hypoglycemic Event  CBG: 27  Treatment: D50 50 mL IV (25 gm)  Symptoms: lethargic  Follow-up CBG: Time: 0745 CBG Result: 129   Possible Reasons for Event: Patient NPO; Not receiving any tube feedings, since patient has PEG tube  Comments/MD notified: MD was notified and MD placed new orders.    Brandon Melnick, RN
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
## 2022-10-21 NOTE — Progress Notes (Signed)
Pharmacy Antibiotic Note  Harry [name] is a 77 y.o. male admitted on 10/20/2022 with pneumonia.  Pharmacy has been consulted for zosyn dosing.  Plan: Zosyn 3.375 gm IV q8h infuse each dose over 4 hours Continue zyvox 600 mg per tube q12h for MRSA coverage MRSA PCR ordered (was neg 10/02/22)   Height: 5\' 9"  (175.3 cm) Weight:  (bed weight say 104.4kg not correct, scale broken) IBW/kg (Calculated) : 70.7  Temp (24hrs), Avg:97.7 F (36.5 C), Min:97.2 F (36.2 C), Max:98 F (36.7 C)  Recent Labs  Lab 10/15/22 0317 10/16/22 0303 10/20/22 1149 10/21/22 0521  WBC 7.5 7.2 6.5 7.7  CREATININE 1.22 1.18 0.88 0.92    Estimated Creatinine Clearance: 69.4 mL/min (by C-G formula based on SCr of 0.92 mg/dL).    Allergies  Allergen Reactions   Lipitor [Atorvastatin] Other (See Comments)    Myalgias   Statins Other (See Comments)    Myalgias  Pt tolerating rosuvastatin 5mg  QD   Pravachol [Pravastatin] Rash    Antimicrobials this admission: 8/18 Zosyn>> 8/18 zyvox (on PTA started 8/14  for cellulitis around PEG tube) >>  Dose adjustments this admission:  Microbiology results: 8/18 MRSA:   Thank you for allowing pharmacy to be a part of this patient's care.   Herby Abraham, Pharm.D Use secure chat for questions 10/21/2022 8:26 AM
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
## 2022-10-21 NOTE — Progress Notes (Signed)
PROGRESS NOTE Harry [name]  QIH:[id number] DOB: [birth year] DOA: 10/20/2022 PCP: Crist Fat, MD  Brief Narrative/Hospital Course: 77 year old male with history of Lewy body dementia,tongue twitching,history of CVA, tongue twitching CKD 3B dysphagia with PEG tube in place, anasarca chronic anemia hyperlipidemia diabetes hypertension brought to the ED from [facility] with moaning and groaning and with increased agitation,recently diagnosed with pneumonia and she was saturating 88% this morning they are placed on 2 L nasal cannula by EMS.Recently discharged on 8/13 where she was hospitalized from 10/02/22> treated for AKI cellulitis around the PEG tube site, aspiration pneumonia lethargy with oxygen and waning mentation cognitive disease.palliative was consulted-but daughter declined any discussion with palliative care.On lat admission GI had seen for anemia. Had 1 unit prbc on 10/02/22. In the ED: Vitals temp 98.2 heart rate 83 respiration 18-22, BP 151/79, 100% oxygen saturation. Labs showed CMP with BUN 38 creatinine 0.8 normal LFTs, hemoglobin 6.8 platelet 123.  Recent hemoglobin 8.0 on 8/13 platelet 211-previously hemoglobin 8 to 9 g.Rectal bag w/ no blood FOBT+.In ED AAOX2 but drowasy apparently new for him. Resp panel neg.  CXR- w/ improved left hazy base/retrocardiac opacification likely due to small effusion with possible associated atelectasis improved hazy opacification of the right infrahilar region. 1 unit prbc ordered and admission requested.     Subjective: Patient seen and examined this morning Overnight he was afebrile Labs showed hypoglycemia 27> ic dextrose given and cbg at 129. He was alert awake oriented to self his date of birth  Able to move all his bilateral upper arms, able to wiggle his toes bilaterally follow commands  He does not know where he is at Denies any pain or abdominal discomfort   Assessment and Plan: Principal Problem:   Anemia   Anemia, acute on  chronic Heme positive stool Possible GI bleeding Tube feed issues- high residue/loose stool Possible cellulitis abdomen wall/Along the PEG track: Patient had recent anemia S/P transfusion in beginning of July and on July 30.  Again with acute on chronic anemia and heme positive stool.  Rectal pouch had dark stool but no bright red bleeding.  Hemodynamically stable.  Hemoglobin has appropriately increased with 1 unit PRBC.  Plavix on hold.  GI has been consulted as per daughter's request, she desires full scope of treatment. Of note -he was deemed not a candidate for endoscopy intervention back in July. Recent anemia panel had iron 21 and normal B12 and low folate. Resume folic acid once able. Recent Labs    06/03/22 1227 06/05/22 0518 09/07/22 1220 09/08/22 0715 10/04/22 0530 10/04/22 0531 10/04/22 1857 10/05/22 0500 10/15/22 0317 10/16/22 0303 10/20/22 1149 10/20/22 2253 10/21/22 0521  HGB 10.8*   < >  --    < >  --    < >  --    < > 8.1* 8.0* 6.8* 7.7* 8.3*  MCV 93.1   < >  --    < >  --    < >  --    < > 91.0 92.4 88.1  --  88.0  VITAMINB12 658  --  628  --   --   --   --   --   --   --   --   --   --   FOLATE  --   --  5.6*  --   --   --   --   --   --   --   --   --   --  FERRITIN  --   --  179  --   --   --  126  --   --   --   --   --   --   TIBC  --   --  200*  --   --   --  176*  --   --   --   --   --   --   IRON  --   --  7*  --   --   --  21*  --   --   --   --   --   --   RETICCTPCT  --   --   --   --  1.0  --   --   --   --   --   --   --   --    < > = values in this interval not displayed.    Acute hypoxic respiratory failure Recurrent aspiration pneumonia Patient hypoxic at the facility, needing 2 L nasal cannula.  BNP stable no evidence of fluid overload, D-dimer obtained and was elevated subsequently underwent CT angio chest showing no PE, extensive endoluminal debris's with near complete impaction of the left bronchial tree and scattered airway impaction within  the right lower lobe, bibasilar pulmonary infiltrates-findings suggest changes of aspiration.  Procalcitonin marginally elevated but given aspiration pharmacy consulted to cover aspiration pneumonia.  PTA patient was on Zyvox through 8/19.  I discussed with pulmonary, asked PT chest.  Keep n.p.o. again patient at risk of recurrent aspiration and further complication given his dementia weakness. Recent Labs  Lab 10/15/22 0317 10/16/22 0303 10/20/22 1149 10/20/22 1717 10/21/22 0521  WBC 7.5 7.2 6.5  --  7.7  PROCALCITON  --   --   --  0.16 0.14   Small pleural effusion Minimal ascites on last admission 8/5: CT shows a small bilateral pleural effusion improved from last imaging-likely multifactorial, nutritional given hypoalbuminemia, BNP stable 211.Echo from July of 11/2022 showed EF 60 to 65%, G1 DD.  Augment nutritional status once able  History of stroke on Plavix: Will hold Plavix in the setting of symptomatic anemia and heme positive stool.  Resume slowly if okay with GI  Dysphagia s/p PEG in place Recently discharged to [facility] 7/15 and PED was placed there: Rectal tube was removed a week ago  per daughter- it was not there on last Thursday when she visited.  On admission rectal polyps noted> order has been placed to discontinue.  At the facility patient was having tube feed residuals also loose stool. CT abdomen shows: Mild s/c infiltration along the gastrostomy catheter tract and moderate fluid within the s/c soft tissues of the left lateral abdominal wall as well as within the fascial planes of the lateral abdominal wall musculature may reflect changes of leakage along an immature tract and appears increased since prior examination"> will keep holding tube feeding, await GI input before resuming TF.  Discussed with GI -CT abdomen reviewed AND okay to start tube feeding, RD consulted.  Continue aspiration precaution. Cont IV antibiotics.  Hypertension: Blood pressure is stable continue  to hold Coreg amlodipine and continue hydralazine.  Acute metabolic encephalopathy Advanced dementia Lewy body dementia: At baseline alert awake oriented to self follows some commands per daughter.  Previously he had been confused lethargic on last admission.  Very lethargic and minimally responsive on admission.  This morning he is alert awake oriented x 1, follows simple commands but no  other meaningful interaction.  Suspect recurrent aspiration hypoglycemia contributed to his lethargy.  Recent head CT/ MRI of the brain on last admission no acute finding. EEG on last admission with mild diffuse encephalopathy nonspecific etiology.   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  Continue the same  CKD stage IIIa: BUN/creatinine stable and improved at 0.9.  Keep on gentle IV fluid hydration Recent Labs    10/09/22 0653 10/10/22 0634 10/11/22 0552 10/12/22 0431 10/13/22 0246 10/14/22 0244 10/15/22 0317 10/16/22 0303 10/20/22 1149 10/21/22 0521  BUN 37* 25* 19 22 27* 24* 25* 27* 38* 32*  CREATININE 1.46* 1.37* 1.32* 1.29* 1.62* 1.30* 1.22 1.18 0.88 0.92  CO2 27 28 26 27 26 28 27 25 26  27    Thrombocytopenia: Likely in the setting of recurrent aspiration infection.  Monitor  Recent Labs  Lab 10/15/22 0317 10/16/22 0303 10/20/22 1149 10/21/22 0521  PLT 232 211 123* 127*    Goals of care: Patient's daughter had refused engagement/discussion with palliative care on last admission, and he remained full code. Overall prognosis does not appear bright, high risk of readmissions. With his dementia, comorbidities and recurrent hospitalization his overall prognosis is not bright. GOC discussion  with daughter, she has agreed to talk to palliative care but adamant on keeping him full code and requesting full scope of treatment.  Pressure ulcer of right deep tissue injury and sacral ulcer POA wound care consulted   DVT prophylaxis: SCDs Start: 10/20/22 1508 Code Status:   Code Status: Full Code Family Communication: plan of care discussed with patient/RN at bedside. Daughter updated on admission, requesting GI and palliative care to follow-up with daughter today.  I discussed with palliative and GI today  Patient status is: Inpatient because of altered status and anemia Level of care: Progressive   Dispo: The patient is from: SNF            Anticipated disposition: Back to skilled nursing facility in 1-2 days  Objective: Vitals last 24 hrs: Vitals:   10/21/22 0445 10/21/22 0449 10/21/22 0513 10/21/22 0738  BP:   133/71 129/66  Pulse:   75 82  Resp:    16  Temp:   98 F (36.7 C)   TempSrc:   Oral   SpO2: (!) 86% 96% 96% 96%  Weight:      Height:       Weight change:   Physical Examination: General exam: alert awake, oriented  TO SELF.FAMILY HEENT:Oral mucosa moist, Ear/Nose WNL grossly Respiratory system: bilaterally diminished BS, no use of accessory muscle Cardiovascular system: S1 & S2 +, No JVD. Gastrointestinal system: Abdomen soft, PEG+ DRESSING INTACT Nervous System:Alert, awake, moving extremities. Extremities: LE edema mild, in fetal position, LE wounds+ Skin: No rashes,no icterus. MSK: small and thin muscle bulk,tone, power  Medications reviewed:  Scheduled Meds:  artificial tears  1 Application Both Eyes TID   hydrALAZINE  25 mg Per Tube Q8H   linezolid  600 mg Per Tube Q12H   pantoprazole  40 mg Intravenous Q12H   pyridostigmine  30 mg Per Tube Q8H   rosuvastatin  5 mg Per Tube QHS   sertraline  25 mg Per Tube Daily   Continuous  Infusions:  dextrose 5 % and 0.9 % NaCl 125 mL/hr at 10/21/22 0738   piperacillin-tazobactam (ZOSYN)  IV     Diet  Order             Diet NPO time specified  Diet effective now                   Intake/Output Summary (Last 24 hours) at 10/21/2022 0812 Last data filed at 10/21/2022 0300 Gross per 24 hour  Intake 754.66 ml  Output 1400 ml  Net -645.34 ml   Net IO Since Admission: -645.34 mL [10/21/22 0812]  Wt Readings from Last 3 Encounters:  10/20/22 78 kg  10/16/22 78 kg  09/16/22 85.7 kg     Unresulted Labs (From admission, onward)     Start     Ordered   10/21/22 0500  Procalcitonin  Daily,   R     References:    Procalcitonin Lower Respiratory Tract Infection AND Sepsis Procalcitonin Algorithm   10/20/22 1526   10/20/22 2200  Hemoglobin and hematocrit, blood  Now then every 6 hours,   R (with TIMED occurrences)      10/20/22 1525          Data Reviewed: I have personally reviewed following labs and imaging studies CBC: Recent Labs  Lab 10/15/22 0317 10/16/22 0303 10/20/22 1149 10/20/22 2253 10/21/22 0521  WBC 7.5 7.2 6.5  --  7.7  NEUTROABS 5.5 5.1  --   --   --   HGB 8.1* 8.0* 6.8* 7.7* 8.3*  HCT 26.4* 26.6* 21.5* 24.4* 26.3*  MCV 91.0 92.4 88.1  --  88.0  PLT 232 211 123*  --  127*   Basic Metabolic Panel: Recent Labs  Lab 10/15/22 0317 10/16/22 0303 10/20/22 1149 10/21/22 0521  NA 144 146* 141 139  K 4.0 4.1 4.5 4.0  CL 112* 111 105 104  CO2 27 25 26 27   GLUCOSE 216* 193* 104* 27*  BUN 25* 27* 38* 32*  CREATININE 1.22 1.18 0.88 0.92  CALCIUM 8.2* 8.5* 8.5* 8.4*   GFR: Estimated Creatinine Clearance: 69.4 mL/min (by C-G formula based on SCr of 0.92 mg/dL). Liver Function Tests: Recent Labs  Lab 10/20/22 1149  AST 26  ALT 31  ALKPHOS 71  BILITOT 0.6  PROT 6.5  ALBUMIN 2.6*   CBG: Recent Labs  Lab 10/16/22 1147 10/16/22 1509 10/16/22 1908 10/21/22 0724 10/21/22 0745  GLUCAP 93 130* 147* 27* 129*   Recent Labs  Lab 10/20/22 1717 10/21/22 0521  PROCALCITON 0.16 0.14    Recent Results (from the past 240 hour(s))  Resp panel by  RT-PCR (RSV, Flu A&B, Covid) Anterior Nasal Swab     Status: None   Collection Time: 10/20/22 11:08 AM   Specimen: Anterior Nasal Swab  Result Value Ref Range Status   SARS Coronavirus 2 by RT PCR NEGATIVE NEGATIVE Final    Comment: (NOTE) SARS-CoV-2 target nucleic acids are NOT DETECTED.  The SARS-CoV-2 RNA is generally detectable in upper respiratory specimens during the acute phase of infection. The lowest concentration of SARS-CoV-2 viral copies this assay can detect is 138 copies/mL. A negative result does not preclude SARS-Cov-2 infection and should not be used as the sole basis for treatment or other patient management decisions. A negative result may occur with  improper specimen collection/handling, submission of specimen other than nasopharyngeal swab, presence of viral mutation(s) within the areas targeted by this assay, and inadequate number of viral copies(<138 copies/mL).  A negative result must be combined with clinical observations, patient history, and epidemiological information. The expected result is Negative.  Fact Sheet for Patients:  BloggerCourse.com  Fact Sheet for Healthcare Providers:  SeriousBroker.it  This test is no t yet approved or cleared by the [place] FDA and  has been authorized for detection and/or diagnosis of SARS-CoV-2 by FDA under an Emergency Use Authorization (EUA). This EUA will remain  in effect (meaning this test can be used) for the duration of the COVID-19 declaration under Section 564(b)(1) of the Act, 21 U.S.C.section 360bbb-3(b)(1), unless the authorization is terminated  or revoked sooner.       Influenza A by PCR NEGATIVE NEGATIVE Final   Influenza B by PCR NEGATIVE NEGATIVE Final    Comment: (NOTE) The Xpert Xpress SARS-CoV-2/FLU/RSV plus assay is intended as an aid in the diagnosis of influenza from Nasopharyngeal swab specimens and should not be used as a sole basis  for treatment. Nasal washings and aspirates are unacceptable for Xpert Xpress SARS-CoV-2/FLU/RSV testing.  Fact Sheet for Patients: BloggerCourse.com  Fact Sheet for Healthcare Providers: SeriousBroker.it  This test is not yet approved or cleared by the [place] FDA and has been authorized for detection and/or diagnosis of SARS-CoV-2 by FDA under an Emergency Use Authorization (EUA). This EUA will remain in effect (meaning this test can be used) for the duration of the COVID-19 declaration under Section 564(b)(1) of the Act, 21 U.S.C. section 360bbb-3(b)(1), unless the authorization is terminated or revoked.     Resp Syncytial Virus by PCR NEGATIVE NEGATIVE Final    Comment: (NOTE) Fact Sheet for Patients: BloggerCourse.com  Fact Sheet for Healthcare Providers: SeriousBroker.it  This test is not yet approved or cleared by the [place] FDA and has been authorized for detection and/or diagnosis of SARS-CoV-2 by FDA under an Emergency Use Authorization (EUA). This EUA will remain in effect (meaning this test can be used) for the duration of the COVID-19 declaration under Section 564(b)(1) of the Act, 21 U.S.C. section 360bbb-3(b)(1), unless the authorization is terminated or revoked.  Performed at [facility], [address] [place], [place] [address]     Antimicrobials: Anti-infectives (From admission, onward)    Start     Dose/Rate Route Frequency Ordered Stop   10/21/22 1000  ceFEPIme (MAXIPIME) 2 g in sodium chloride 0.9 % 100 mL IVPB  Status:  Discontinued        2 g 200 mL/hr over 30 Minutes Intravenous Every 8 hours 10/21/22 0804 10/21/22 0811   10/21/22 1000  piperacillin-tazobactam (ZOSYN) IVPB 3.375 g        3.375 g 12.5 mL/hr over 240 Minutes Intravenous Every 8 hours 10/21/22 0811     10/20/22 2200  linezolid (ZYVOX) tablet 600 mg         600 mg Per Tube Every 12 hours 10/20/22 1459        Culture/Microbiology    Component Value Date/Time   SDES  10/03/2022 1609    BLOOD RIGHT HAND Performed at [facility], [address], [place], [place] [address]    SPECREQUEST  10/03/2022 1609    BOTTLES DRAWN AEROBIC AND ANAEROBIC Blood Culture adequate volume Performed at [facility], [address], [place], [place] [address]    CULT  10/03/2022 1609    NO GROWTH 5 DAYS Performed at [facility], [address], [place], [place] [address]    REPTSTATUS 10/08/2022 FINAL 10/03/2022 1609  Other culture-see note  Radiology Studies: CT Angio Chest Pulmonary Embolism (PE) W or WO Contrast  Result Date: 10/21/2022 CLINICAL DATA:  Pulmonary embolism (PE), high prob; Abdominal pain, acute, nonlocalized EXAM: CT ANGIOGRAPHY CHEST CT ABDOMEN AND PELVIS WITH CONTRAST TECHNIQUE: Multidetector CT imaging of the chest was performed using the standard protocol during bolus administration of intravenous contrast. Multiplanar CT image reconstructions and MIPs were obtained to evaluate the vascular anatomy. Multidetector CT imaging of the abdomen and pelvis was performed using the standard protocol during bolus administration of intravenous contrast. RADIATION DOSE REDUCTION: This exam was performed according to the departmental dose-optimization program which includes automated exposure control, adjustment of the mA and/or kV according to patient size and/or use of iterative reconstruction technique. CONTRAST:  OMNIPAQUE IOHEXOL 350 MG/ML SOLN COMPARISON:  CT chest 09/06/2022, CT abdomen pelvis 10/08/2022. FINDINGS: CTA CHEST FINDINGS Cardiovascular: There is adequate opacification of the pulmonary arterial tree. No intraluminal filling defect identified to suggest acute pulmonary embolism. Central pulmonary arteries are of normal caliber. Moderate coronary artery calcification. Global cardiac size within  normal limits. No pericardial effusion. Mild atherosclerotic calcification within the thoracic aorta. No aortic aneurysm. Mediastinum/Nodes: Visualized thyroid is unremarkable. Stable shotty right paratracheal and bilateral hilar adenopathy, nonspecific, possibly reactive in nature. The esophagus is unremarkable. Lungs/Pleura: There is extensive and luminal debris within the tracheobronchial tree with near complete impaction of the left bronchial tree and scattered airway impaction within the right lower lobe. Bibasilar pulmonary infiltrates are present. Together, the findings suggest changes of aspiration. Small bilateral pleural effusions are present, new since prior examination. No pneumothorax. Musculoskeletal: No acute bone abnormality. No lytic or blastic bone lesion. Review of the MIP images confirms the above findings. CT ABDOMEN and PELVIS FINDINGS Hepatobiliary: Cholelithiasis without pericholecystic inflammatory change. Tiny cyst within the right hepatic dome. Liver otherwise unremarkable. No intra or extrahepatic biliary ductal dilation. Pancreas: Unremarkable. No pancreatic ductal dilatation or surrounding inflammatory changes. Spleen: Normal in size without focal abnormality. Adrenals/Urinary Tract: The adrenal glands are unremarkable. The kidneys are normal in size and position. Simple cortical cyst noted within the lower pole right kidney for which no follow-up imaging is recommended. The kidneys are otherwise unremarkable. Foley catheter balloon is seen within a largely decompressed bladder lumen. There is circumferential marked bladder wall thickening suggesting changes of chronic bladder outlet obstruction, similar to prior examination. Stomach/Bowel: Previously noted button type PEG tube has been exchanged for a balloon retention gastrostomy catheter which is in expected position within the gastric lumen. Infiltration along the tract of the gastrostomy catheter appears improved since prior  examination related to leakage or inflammation along the tract. Moderate fluid is again seen within the subcutaneous soft tissues of the left lateral abdominal wall as well as within the fascial planes of the lateral abdominal wall musculature, increased since prior examination. Stomach, small bowel, and large bowel are otherwise unremarkable. Appendix normal. No free intraperitoneal gas or fluid. Vascular/Lymphatic: Extensive atherosclerotic calcification within the aortoiliac vasculature. No aortic aneurysm. No pathologic adenopathy within the abdomen and pelvis. Reproductive: Marked prostatic hypertrophy. Other: Mild diffuse subcutaneous edema again noted within the gluteal soft tissues and visualized lower extremities bilaterally. Musculoskeletal: No acute bone abnormality. No lytic or blastic bone lesion. Review of the MIP images confirms the above findings. IMPRESSION: 1. No pulmonary embolism. 2. Extensive endoluminal debris within the tracheobronchial tree with near complete impaction of the left bronchial tree and scattered airway impaction within the right lower lobe. Bibasilar pulmonary infiltrates. Together, the findings  suggest changes of aspiration. 3. Small bilateral pleural effusions, new since prior examination but improved since CT examination of the abdomen pelvis of 10/08/2022. 4. Moderate coronary artery calcification. 5. Cholelithiasis. 6. Marked prostatic hypertrophy. Changes of chronic bladder outlet obstruction with Foley decompression of the bladder. 7. Interval gastrostomy exchange. Mild subcutaneous infiltration along the gastrostomy catheter tract and moderate fluid within the subcutaneous soft tissues of the left lateral abdominal wall as well as within the fascial planes of the lateral abdominal wall musculature may reflect changes of leakage along an immature tract and appears increased since prior examination. Aortic Atherosclerosis (ICD10-I70.0). Electronically Signed   By: Helyn Numbers M.D.   On: 10/21/2022 02:56   CT ABDOMEN PELVIS W CONTRAST  Result Date: 10/21/2022 CLINICAL DATA:  Pulmonary embolism (PE), high prob; Abdominal pain, acute, nonlocalized EXAM: CT ANGIOGRAPHY CHEST CT ABDOMEN AND PELVIS WITH CONTRAST TECHNIQUE: Multidetector CT imaging of the chest was performed using the standard protocol during bolus administration of intravenous contrast. Multiplanar CT image reconstructions and MIPs were obtained to evaluate the vascular anatomy. Multidetector CT imaging of the abdomen and pelvis was performed using the standard protocol during bolus administration of intravenous contrast. RADIATION DOSE REDUCTION: This exam was performed according to the departmental dose-optimization program which includes automated exposure control, adjustment of the mA and/or kV according to patient size and/or use of iterative reconstruction technique. CONTRAST:  OMNIPAQUE IOHEXOL 350 MG/ML SOLN COMPARISON:  CT chest 09/06/2022, CT abdomen pelvis 10/08/2022. FINDINGS: CTA CHEST FINDINGS Cardiovascular: There is adequate opacification of the pulmonary arterial tree. No intraluminal filling defect identified to suggest acute pulmonary embolism. Central pulmonary arteries are of normal caliber. Moderate coronary artery calcification. Global cardiac size within normal limits. No pericardial effusion. Mild atherosclerotic calcification within the thoracic aorta. No aortic aneurysm. Mediastinum/Nodes: Visualized thyroid is unremarkable. Stable shotty right paratracheal and bilateral hilar adenopathy, nonspecific, possibly reactive in nature. The esophagus is unremarkable. Lungs/Pleura: There is extensive and luminal debris within the tracheobronchial tree with near complete impaction of the left bronchial tree and scattered airway impaction within the right lower lobe. Bibasilar pulmonary infiltrates are present. Together, the findings suggest changes of aspiration. Small bilateral pleural  effusions are present, new since prior examination. No pneumothorax. Musculoskeletal: No acute bone abnormality. No lytic or blastic bone lesion. Review of the MIP images confirms the above findings. CT ABDOMEN and PELVIS FINDINGS Hepatobiliary: Cholelithiasis without pericholecystic inflammatory change. Tiny cyst within the right hepatic dome. Liver otherwise unremarkable. No intra or extrahepatic biliary ductal dilation. Pancreas: Unremarkable. No pancreatic ductal dilatation or surrounding inflammatory changes. Spleen: Normal in size without focal abnormality. Adrenals/Urinary Tract: The adrenal glands are unremarkable. The kidneys are normal in size and position. Simple cortical cyst noted within the lower pole right kidney for which no follow-up imaging is recommended. The kidneys are otherwise unremarkable. Foley catheter balloon is seen within a largely decompressed bladder lumen. There is circumferential marked bladder wall thickening suggesting changes of chronic bladder outlet obstruction, similar to prior examination. Stomach/Bowel: Previously noted button type PEG tube has been exchanged for a balloon retention gastrostomy catheter which is in expected position within the gastric lumen. Infiltration along the tract of the gastrostomy catheter appears improved since prior examination related to leakage or inflammation along the tract. Moderate fluid is again seen within the subcutaneous soft tissues of the left lateral abdominal wall as well as within the fascial planes of the lateral abdominal wall musculature, increased since prior examination. Stomach, small bowel, and large  bowel are otherwise unremarkable. Appendix normal. No free intraperitoneal gas or fluid. Vascular/Lymphatic: Extensive atherosclerotic calcification within the aortoiliac vasculature. No aortic aneurysm. No pathologic adenopathy within the abdomen and pelvis. Reproductive: Marked prostatic hypertrophy. Other: Mild diffuse  subcutaneous edema again noted within the gluteal soft tissues and visualized lower extremities bilaterally. Musculoskeletal: No acute bone abnormality. No lytic or blastic bone lesion. Review of the MIP images confirms the above findings. IMPRESSION: 1. No pulmonary embolism. 2. Extensive endoluminal debris within the tracheobronchial tree with near complete impaction of the left bronchial tree and scattered airway impaction within the right lower lobe. Bibasilar pulmonary infiltrates. Together, the findings suggest changes of aspiration. 3. Small bilateral pleural effusions, new since prior examination but improved since CT examination of the abdomen pelvis of 10/08/2022. 4. Moderate coronary artery calcification. 5. Cholelithiasis. 6. Marked prostatic hypertrophy. Changes of chronic bladder outlet obstruction with Foley decompression of the bladder. 7. Interval gastrostomy exchange. Mild subcutaneous infiltration along the gastrostomy catheter tract and moderate fluid within the subcutaneous soft tissues of the left lateral abdominal wall as well as within the fascial planes of the lateral abdominal wall musculature may reflect changes of leakage along an immature tract and appears increased since prior examination. Aortic Atherosclerosis (ICD10-I70.0). Electronically Signed   By: Helyn Numbers M.D.   On: 10/21/2022 02:56   DG Chest 2 View  Result Date: 10/20/2022 CLINICAL DATA:  Cough.  Recently diagnosed with pneumonia. EXAM: CHEST - 2 VIEW COMPARISON:  10/02/2022, CT abdomen 10/08/2022 FINDINGS: Lungs are adequately inflated demonstrate hazy left base/retrocardiac opacification likely due to small effusion with possible associated atelectasis. Hazy opacification over the right infrahilar region which may be due to asymmetric edema versus infection. Overall interval improved bibasilar opacification likely improved effusions. Cardiomediastinal silhouette and remainder of the exam is unchanged. IMPRESSION:  1. Improved hazy left base/retrocardiac opacification likely due to small effusion with possible associated atelectasis. 2. Improved hazy opacification over the right infrahilar region which may be due to asymmetric edema, atelectasis or infection. Electronically Signed   By: Elberta Fortis M.D.   On: 10/20/2022 11:41     LOS: 1 day   Lanae Boast, MD [facility]  10/21/2022, 8:15 AM

## 2022-10-21 NOTE — Plan of Care (Signed)

## 2022-10-21 NOTE — Progress Notes (Signed)
Initial Nutrition Assessment  DOCUMENTATION CODES:   Not applicable  INTERVENTION:  - Initiate EN via PEG: Initiate Jevity 1.5 @ 20 mL/hr and advance by 10 mL Q4H to a goal rate of 70 mL/hr (1680 mL) + TF20 PS x1.  - This will provide 2600 kcals, 127 gm protein, and 1276 mL free water.  - Add FWF of 150 mL Q4H. This will provide 2176 mL total free water with TF.   - Add Banatrol TID.   NUTRITION DIAGNOSIS:   Increased nutrient needs related to wound healing as evidenced by estimated needs.  GOAL:   Patient will meet greater than or equal to 90% of their needs  MONITOR:   TF tolerance  REASON FOR ASSESSMENT:   Consult Enteral/tube feeding initiation and management  ASSESSMENT:   77 y.o. admits related to drowsiness and hypoxia. PMH includes: bronchitis, stroke, pyelonephritis, AKI, CAP, CKD, HLD, HTN, PVD, stroke, T2DM. Pt is currently receiving medical management related to anemia.  Meds reviewed. Labs reviewed: BUN elevated. IVF: D5 NS @ 125 mL/hr.   MD consult for EN management. Pt with PEG to LLQ. Oriented x1. RD unable to gather nutrition hx details at this time. Pt with diarrhea. Pt being tested for Cdiff. Pt previously receiving Jevity 1.5 on previous admission. RD will add Banatrol TID. Pt with increased nutrient needs related to wound healing. RD will continue to monitor TF tolerance. Once patient is at goal rate, consider switching over to bolus feeds.   NUTRITION - FOCUSED PHYSICAL EXAM:  Remote assessment.  Diet Order:   Diet Order             Diet NPO time specified  Diet effective now                   EDUCATION NEEDS:   Not appropriate for education at this time  Skin:  Skin Integrity Issues:: Stage II DTI: R heel Stage II: Mid sacrum  Last BM:  8/18 - type 7  Height:   Ht Readings from Last 1 Encounters:  10/20/22 5\' 9"  (1.753 m)    Weight:   Wt Readings from Last 1 Encounters:  10/20/22 78 kg    Ideal Body Weight:     BMI:   Body mass index is 25.39 kg/m.  Estimated Nutritional Needs:   Kcal:  2300-2700 kcals  Protein:  105-135 gm  Fluid:  >/= 2.3 L  Bethann Humble, RD, LDN, CNSC.

## 2022-10-22 ENCOUNTER — Inpatient Hospital Stay (HOSPITAL_COMMUNITY): Payer: Medicare Other

## 2022-10-22 DIAGNOSIS — K9423 Gastrostomy malfunction: Secondary | ICD-10-CM | POA: Diagnosis not present

## 2022-10-22 DIAGNOSIS — D500 Iron deficiency anemia secondary to blood loss (chronic): Secondary | ICD-10-CM

## 2022-10-22 LAB — PREPARE RBC (CROSSMATCH)

## 2022-10-22 LAB — CBC
HCT: 22.6 % — ABNORMAL LOW (ref 39.0–52.0)
Hemoglobin: 7.1 g/dL — ABNORMAL LOW (ref 13.0–17.0)
MCH: 27.7 pg (ref 26.0–34.0)
MCHC: 31.4 g/dL (ref 30.0–36.0)
MCV: 88.3 fL (ref 80.0–100.0)
Platelets: 123 10*3/uL — ABNORMAL LOW (ref 150–400)
RBC: 2.56 MIL/uL — ABNORMAL LOW (ref 4.22–5.81)
RDW: 17.4 % — ABNORMAL HIGH (ref 11.5–15.5)
WBC: 9.3 10*3/uL (ref 4.0–10.5)
nRBC: 0 % (ref 0.0–0.2)

## 2022-10-22 LAB — GASTROINTESTINAL PANEL BY PCR, STOOL (REPLACES STOOL CULTURE)
Adenovirus F40/41: NOT DETECTED
Astrovirus: NOT DETECTED
Campylobacter species: NOT DETECTED
Cryptosporidium: NOT DETECTED
Cyclospora cayetanensis: NOT DETECTED
Entamoeba histolytica: NOT DETECTED
Enteroaggregative E coli (EAEC): NOT DETECTED
Enteropathogenic E coli (EPEC): NOT DETECTED
Enterotoxigenic E coli (ETEC): DETECTED — AB
Giardia lamblia: NOT DETECTED
Norovirus GI/GII: NOT DETECTED
Plesimonas shigelloides: NOT DETECTED
Rotavirus A: NOT DETECTED
Salmonella species: NOT DETECTED
Sapovirus (I, II, IV, and V): NOT DETECTED
Shiga like toxin producing E coli (STEC): NOT DETECTED
Shigella/Enteroinvasive E coli (EIEC): NOT DETECTED
Vibrio cholerae: NOT DETECTED
Vibrio species: NOT DETECTED
Yersinia enterocolitica: NOT DETECTED

## 2022-10-22 LAB — BASIC METABOLIC PANEL
Anion gap: 9 (ref 5–15)
BUN: 34 mg/dL — ABNORMAL HIGH (ref 8–23)
CO2: 23 mmol/L (ref 22–32)
Calcium: 7.8 mg/dL — ABNORMAL LOW (ref 8.9–10.3)
Chloride: 107 mmol/L (ref 98–111)
Creatinine, Ser: 1.19 mg/dL (ref 0.61–1.24)
GFR, Estimated: >60 mL/min (ref >60)
Glucose, Bld: 287 mg/dL — ABNORMAL HIGH (ref 70–99)
Potassium: 4.8 mmol/L (ref 3.5–5.1)
Sodium: 139 mmol/L (ref 135–145)

## 2022-10-22 LAB — GLUCOSE, CAPILLARY
Glucose-Capillary: 254 mg/dL — ABNORMAL HIGH (ref 70–99)
Glucose-Capillary: 283 mg/dL — ABNORMAL HIGH (ref 70–99)
Glucose-Capillary: 294 mg/dL — ABNORMAL HIGH (ref 70–99)
Glucose-Capillary: 286 mg/dL — ABNORMAL HIGH (ref 70–99)
Glucose-Capillary: 155 mg/dL — ABNORMAL HIGH (ref 70–99)

## 2022-10-22 LAB — PROCALCITONIN: Procalcitonin: 0.54 ng/mL

## 2022-10-22 MED ORDER — MEDIHONEY WOUND/BURN DRESSING EX PSTE
1.0000 | PASTE | Freq: Every day | CUTANEOUS | Status: DC
  Administered 2022-10-22 – 2022-10-30 (×9): 1 via TOPICAL
  Filled 2022-10-22: qty 44

## 2022-10-22 MED ORDER — JEVITY 1.5 CAL/FIBER PO LIQD
1000.0000 mL | ORAL | Status: DC
  Administered 2022-10-22: 1000 mL
  Filled 2022-10-22 (×3): qty 1000

## 2022-10-22 MED ORDER — INSULIN ASPART 100 UNIT/ML IJ SOLN
0.0000 [IU] | Freq: Three times a day (TID) | INTRAMUSCULAR | Status: DC
  Administered 2022-10-22: 5 [IU] via SUBCUTANEOUS
  Administered 2022-10-23: 1 [IU] via SUBCUTANEOUS
  Administered 2022-10-24 (×2): 2 [IU] via SUBCUTANEOUS
  Administered 2022-10-25 – 2022-10-27 (×5): 3 [IU] via SUBCUTANEOUS
  Administered 2022-10-27 – 2022-10-29 (×6): 2 [IU] via SUBCUTANEOUS
  Administered 2022-10-29: 1 [IU] via SUBCUTANEOUS
  Administered 2022-10-30 (×2): 3 [IU] via SUBCUTANEOUS

## 2022-10-22 MED ORDER — LORATADINE 10 MG PO TABS
10.0000 mg | ORAL_TABLET | Freq: Every day | ORAL | Status: DC
  Administered 2022-10-22 – 2022-10-30 (×9): 10 mg
  Filled 2022-10-22 (×9): qty 1

## 2022-10-22 MED ORDER — INSULIN ASPART 100 UNIT/ML IJ SOLN: 0.0000 [IU] | Freq: Every day | INTRAMUSCULAR | Status: DC

## 2022-10-22 MED ORDER — DEXTROSE IN LACTATED RINGERS 5 % IV SOLN: INTRAVENOUS | Status: DC

## 2022-10-22 MED ORDER — INSULIN GLARGINE-YFGN 100 UNIT/ML ~~LOC~~ SOLN
10.0000 [IU] | Freq: Every day | SUBCUTANEOUS | Status: DC
  Administered 2022-10-22: 10 [IU] via SUBCUTANEOUS
  Filled 2022-10-22: qty 0.1

## 2022-10-22 MED ORDER — SODIUM CHLORIDE 0.9% IV SOLUTION: Freq: Once | INTRAVENOUS | Status: AC

## 2022-10-22 NOTE — Progress Notes (Signed)
PROGRESS NOTE Harry [name]  ZOX:[id number] DOB: 1945/10/30 DOA: 10/20/2022 PCP: Crist Fat, MD  Brief Narrative/Hospital Course: 77 year old male with history of Lewy body dementia,tongue twitching,history of CVA, CKD 3B dysphagia with PEG tube in place, anasarca ,chronic anemia ,hyperlipidemia, diabetes ,hypertension brought to the ED from [facility] with moaning and groaning and with increased agitation,recently diagnosed with pneumonia.  Recently discharged on 8/13 where he was treated for aspiration pneumonia.  On last admission GI had seen for anemia. Had 1 unit prbc on 10/02/22. In the ED: Vitals temp 98.2 heart rate 83 respiration 18-22, BP 151/79, 100% oxygen saturation. Labs showed CMP with BUN 38 creatinine 0.8 normal LFTs, hemoglobin 6.8 platelet 123.  Recent hemoglobin 8.0 on 8/13 platelet 211-previously hemoglobin 8 to 9 g.Rectal bag w/ no blood FOBT+.In ED AAOX2 but drowasy apparently new for him. Resp panel neg.  CXR- w/ improved left hazy base/retrocardiac opacification likely due to small effusion with possible associated atelectasis improved hazy opacification of the right infrahilar region. 1 unit prbc ordered and admission requested.     Subjective: Patient seen and examined.  Poor historian.  Uncomfortable on bed care.  Blood sugars better after started on tube feeding.  Unable to express any concerns.  Called and discussed with patient's daughter.   Assessment and Plan: Principal Problem:   Anemia Active Problems:   Need for emotional support   S/P percutaneous endoscopic gastrostomy (PEG) tube placement (HCC)   Aspiration pneumonia (HCC)   Counseling and coordination of care   Anemia requiring transfusions   Palliative care encounter   Anemia, acute on chronic Heme positive stool Possible GI bleeding Tube feed issues- high residue/loose stool Possible cellulitis abdomen wall/Along the PEG track: Transfused 1 unit of PRBC on admission, hemoglobin 7.1, another  1 unit today.  This is her fourth unit of blood transfusion in 1 month.  Hemodynamically stable.  Plavix on hold.  GI has been consulted as per daughter's request, she desires full scope of treatment. Seen by gastroenterology.  Does not recommend endoscopic evaluation due to high risk of getting intubated and aspiration along with adverse events from the anesthesia. Conservative management advised.  On PPI.    Acute hypoxic respiratory failure Recurrent aspiration pneumonia Patient hypoxic at the facility, needing 2 L nasal cannula.  BNP stable no evidence of fluid overload, D-dimer obtained and was elevated subsequently underwent CT angio chest showing no PE, extensive endoluminal debris's with near complete impaction of the left bronchial tree and scattered airway impaction within the right lower lobe, bibasilar pulmonary infiltrates-findings suggest changes of aspiration.  Procalcitonin marginally elevated but given aspiration pharmacy consulted to cover aspiration pneumonia.  PTA patient was on Zyvox through 8/19.  Chest physiotherapy as much patient is able to do it.  Completed Zyvox therapy.  Currently on Zosyn.  Will continue today.  Recent Labs  Lab 10/16/22 0303 10/20/22 1149 10/20/22 1717 10/21/22 0521 10/22/22 0454  WBC 7.2 6.5  --  7.7 9.3  PROCALCITON  --   --  0.16 0.14 0.54   Small pleural effusion Minimal ascites on last admission 8/5: CT shows a small bilateral pleural effusion improved from last imaging-likely multifactorial, nutritional given hypoalbuminemia, BNP stable 211.Echo from July of 11/2022 showed EF 60 to 65%, G1 DD.  Augment nutritional status once able  History of stroke on Plavix: Will hold Plavix in the setting of symptomatic anemia and heme positive stool.  Patient is unable to tolerate Plavix.  He has history of stroke  and latest stroke 5 months ago. Discussed with family.  Will discontinue Plavix.  Dysphagia s/p PEG in place Recently discharged to  [facility] 7/15 and PEG was placed there: CT abdomen shows: Mild s/c infiltration along the gastrostomy catheter tract and moderate fluid within the s/c soft tissues of the left lateral abdominal wall as well as within the fascial planes of the lateral abdominal wall musculature may reflect changes of leakage along an immature tract and appears increased since prior examination.  He may have some of the tube feeding leaking around the abdominal wall. Currently tolerating tube feeding at goal. Aspiration precautions.  Elevate head of the bed.  Continue IV antibiotics.  Repeat speech therapy evaluation today.   Hypertension: Blood pressure is stable continue to hold Coreg amlodipine and continue hydralazine.  Acute metabolic encephalopathy Advanced dementia Lewy body dementia: At baseline alert awake oriented to self follows some commands per daughter.  Previously he had been confused lethargic on last admission.  Very lethargic and minimally responsive on admission.  This morning he is alert awake oriented x 1, follows simple commands but no other meaningful interaction.  Suspect recurrent aspiration hypoglycemia contributed to his lethargy.  Recent head CT/ MRI of the brain on last admission no acute finding. EEG on last admission with mild diffuse encephalopathy nonspecific etiology.   Type 2 diabetes mellitus W/ hypoglycemia: Improved.  Blood sugars elevated after starting tube feeding.  Start long-acting insulin and sliding scale insulin.  Discontinue dextrose. 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  Continue the same  CKD stage IIIa: BUN/creatinine stable and at about baseline. Recent Labs    10/10/22 0634 10/11/22 0552 10/12/22 0431 10/13/22 0246 10/14/22 0244 10/15/22 0317 10/16/22 0303 10/20/22 1149 10/21/22 0521 10/22/22 0454  BUN 25*  19 22 27* 24* 25* 27* 38* 32* 34*  CREATININE 1.37* 1.32* 1.29* 1.62* 1.30* 1.22 1.18 0.88 0.92 1.19  CO2 28 26 27 26 28 27 25 26 27  23    Thrombocytopenia: Likely in the setting of recurrent aspiration infection.  Monitor  Recent Labs  Lab 10/16/22 0303 10/20/22 1149 10/21/22 0521 10/22/22 0454  PLT 211 123* 127* 123*    Goals of care: Followed by palliative care.  Currently full code.  Pressure Injury 10/02/22 Heel Right Deep Tissue Pressure Injury - Purple or maroon localized area of discolored intact skin or blood-filled blister due to damage of underlying soft tissue from pressure and/or shear. deep purple; skin intact; 4cmx2cm (Active)  10/02/22 0330  Location: Heel  Location Orientation: Right  Staging: Deep Tissue Pressure Injury - Purple or maroon localized area of discolored intact skin or blood-filled blister due to damage of underlying soft tissue from pressure and/or shear.  Wound Description (Comments): deep purple; skin intact; 4cmx2cm  Present on Admission: Yes     Pressure Injury 10/02/22 Heel Right Deep Tissue Pressure Injury - Purple or maroon localized area of discolored intact skin or blood-filled blister due to damage of underlying soft tissue from pressure and/or shear. deep purple; skin intact; 3cmx6cm (Active)  10/02/22 0330  Location: Heel  Location Orientation: Right  Staging: Deep Tissue Pressure Injury - Purple or maroon localized area of discolored intact skin or blood-filled blister due to damage of underlying soft tissue from pressure and/or shear.  Wound Description (Comments): deep purple; skin intact; 3cmx6cm  Present on Admission: Yes  Pressure Injury 10/02/22 Sacrum Mid Stage 2 -  Partial thickness loss of dermis presenting as a shallow open injury with a red, pink wound bed without slough. 4cmx2cm (Active)  10/02/22 0330  Location: Sacrum  Location Orientation: Mid  Staging: Stage 2 -  Partial thickness loss of dermis presenting as a  shallow open injury with a red, pink wound bed without slough. (Was told in report it was a Stage IV; possibly healed St IV?)  Wound Description (Comments): 4cmx2cm  Present on Admission: Yes       DVT prophylaxis: SCDs Start: 10/20/22 1508 Code Status:   Code Status: Full Code Family Communication: Daughter on the phone.  Patient status is: Inpatient because of altered status and anemia Level of care: Progressive   Dispo: The patient is from: SNF            Anticipated disposition: Back to skilled nursing facility in 1-2 days  Objective: Vitals last 24 hrs: Vitals:   10/21/22 0738 10/21/22 1452 10/21/22 1936 10/22/22 0440  BP: 129/66 127/65 134/68 128/65  Pulse: 82 90 100 98  Resp: 16 17  19   Temp:  98.6 F (37 C) 99.4 F (37.4 C) 97.9 F (36.6 C)  TempSrc:  Oral Oral Oral  SpO2: 96% 98% 90% 97%  Weight:      Height:       Weight change:   Physical Examination:  General: Chronically sick looking.  Frail and debilitated.  Not very interactive. Cardiovascular: S1-S2 normal.  Regular rate rhythm. Respiratory: Bilateral clear on room air. Gastrointestinal: Soft.  Chronic induration around the PEG tube site.  PEG tube infusion going on. Ext: Debilitated.  Extremely weak. Neuro: Alert and awake but not oriented. Skin: Pressure ulcer on the back and on the legs.  Pictures in the chart.  Medications reviewed:  Scheduled Meds:  sodium chloride   Intravenous Once   artificial tears  1 Application Both Eyes TID   Chlorhexidine Gluconate Cloth  6 each Topical Daily   feeding supplement (PROSource TF20)  60 mL Per Tube Daily   free water  150 mL Per Tube Q4H   hydrALAZINE  25 mg Per Tube Q8H   insulin aspart  0-5 Units Subcutaneous QHS   insulin aspart  0-9 Units Subcutaneous TID WC   insulin glargine-yfgn  10 Units Subcutaneous QHS   leptospermum manuka honey  1 Application Topical Daily   linezolid  600 mg Per Tube Q12H   loratadine  10 mg Per Tube Daily   pantoprazole   40 mg Intravenous Q12H   pyridostigmine  30 mg Per Tube Q8H   rosuvastatin  5 mg Per Tube QHS   sertraline  25 mg Per Tube Daily   Continuous Infusions:  feeding supplement (JEVITY 1.5 CAL/FIBER)     piperacillin-tazobactam (ZOSYN)  IV 3.375 g (10/22/22 1126)   Diet Order             Diet NPO time specified  Diet effective now                   Intake/Output Summary (Last 24 hours) at 10/22/2022 1353 Last data filed at 10/22/2022 0700 Gross per 24 hour  Intake 1006.23 ml  Output 700 ml  Net 306.23 ml   Net IO Since Admission: -1,039.11 mL [10/22/22 1353]  Wt Readings from Last 3 Encounters:  10/16/22 78 kg  09/16/22 85.7 kg  06/01/22 84 kg     Unresulted Labs (From admission, onward)  Start     Ordered   10/22/22 0500  Basic metabolic panel  Daily,   R      10/21/22 1143   10/22/22 0500  CBC  Daily,   R      10/21/22 1143   10/21/22 1031  Gastrointestinal Panel by PCR , Stool  (Gastrointestinal Panel by PCR, Stool                                                                                                                                                     **Does Not include CLOSTRIDIUM DIFFICILE testing. **If CDIFF testing is needed, place order from the "C Difficile Testing" order set.**)  Once,   R        10/21/22 1030   10/21/22 0500  Procalcitonin  Daily,   R     References:    Procalcitonin Lower Respiratory Tract Infection AND Sepsis Procalcitonin Algorithm   10/20/22 1526          Data Reviewed: I have personally reviewed following labs and imaging studies CBC: Recent Labs  Lab 10/16/22 0303 10/20/22 1149 10/20/22 2253 10/21/22 0521 10/21/22 1012 10/22/22 0454  WBC 7.2 6.5  --  7.7  --  9.3  NEUTROABS 5.1  --   --   --   --   --   HGB 8.0* 6.8* 7.7* 8.3* 7.8* 7.1*  HCT 26.6* 21.5* 24.4* 26.3* 24.9* 22.6*  MCV 92.4 88.1  --  88.0  --  88.3  PLT 211 123*  --  127*  --  123*   Basic Metabolic Panel: Recent Labs  Lab 10/16/22 0303  10/20/22 1149 10/21/22 0521 10/22/22 0454  NA 146* 141 139 139  K 4.1 4.5 4.0 4.8  CL 111 105 104 107  CO2 25 26 27 23   GLUCOSE 193* 104* 27* 287*  BUN 27* 38* 32* 34*  CREATININE 1.18 0.88 0.92 1.19  CALCIUM 8.5* 8.5* 8.4* 7.8*   GFR: Estimated Creatinine Clearance: 53.6 mL/min (by C-G formula based on SCr of 1.19 mg/dL). Liver Function Tests: Recent Labs  Lab 10/20/22 1149  AST 26  ALT 31  ALKPHOS 71  BILITOT 0.6  PROT 6.5  ALBUMIN 2.6*   CBG: Recent Labs  Lab 10/21/22 1932 10/21/22 2351 10/22/22 0413 10/22/22 0725 10/22/22 1217  GLUCAP 163* 184* 254* 283* 294*   Recent Labs  Lab 10/20/22 1717 10/21/22 0521 10/22/22 0454  PROCALCITON 0.16 0.14 0.54    Recent Results (from the past 240 hour(s))  Resp panel by RT-PCR (RSV, Flu A&B, Covid) Anterior Nasal Swab     Status: None   Collection Time: 10/20/22 11:08 AM   Specimen: Anterior Nasal Swab  Result Value Ref Range Status   SARS Coronavirus 2 by RT PCR NEGATIVE NEGATIVE Final    Comment: (NOTE) SARS-CoV-2 target nucleic acids  are NOT DETECTED.  The SARS-CoV-2 RNA is generally detectable in upper respiratory specimens during the acute phase of infection. The lowest concentration of SARS-CoV-2 viral copies this assay can detect is 138 copies/mL. A negative result does not preclude SARS-Cov-2 infection and should not be used as the sole basis for treatment or other patient management decisions. A negative result may occur with  improper specimen collection/handling, submission of specimen other than nasopharyngeal swab, presence of viral mutation(s) within the areas targeted by this assay, and inadequate number of viral copies(<138 copies/mL). A negative result must be combined with clinical observations, patient history, and epidemiological information. The expected result is Negative.  Fact Sheet for Patients:  BloggerCourse.com  Fact Sheet for Healthcare Providers:   SeriousBroker.it  This test is no t yet approved or cleared by the [place] FDA and  has been authorized for detection and/or diagnosis of SARS-CoV-2 by FDA under an Emergency Use Authorization (EUA). This EUA will remain  in effect (meaning this test can be used) for the duration of the COVID-19 declaration under Section 564(b)(1) of the Act, 21 U.S.C.section 360bbb-3(b)(1), unless the authorization is terminated  or revoked sooner.       Influenza A by PCR NEGATIVE NEGATIVE Final   Influenza B by PCR NEGATIVE NEGATIVE Final    Comment: (NOTE) The Xpert Xpress SARS-CoV-2/FLU/RSV plus assay is intended as an aid in the diagnosis of influenza from Nasopharyngeal swab specimens and should not be used as a sole basis for treatment. Nasal washings and aspirates are unacceptable for Xpert Xpress SARS-CoV-2/FLU/RSV testing.  Fact Sheet for Patients: BloggerCourse.com  Fact Sheet for Healthcare Providers: SeriousBroker.it  This test is not yet approved or cleared by the [place] FDA and has been authorized for detection and/or diagnosis of SARS-CoV-2 by FDA under an Emergency Use Authorization (EUA). This EUA will remain in effect (meaning this test can be used) for the duration of the COVID-19 declaration under Section 564(b)(1) of the Act, 21 U.S.C. section 360bbb-3(b)(1), unless the authorization is terminated or revoked.     Resp Syncytial Virus by PCR NEGATIVE NEGATIVE Final    Comment: (NOTE) Fact Sheet for Patients: BloggerCourse.com  Fact Sheet for Healthcare Providers: SeriousBroker.it  This test is not yet approved or cleared by the [place] FDA and has been authorized for detection and/or diagnosis of SARS-CoV-2 by FDA under an Emergency Use Authorization (EUA). This EUA will remain in effect (meaning this test can be used) for  the duration of the COVID-19 declaration under Section 564(b)(1) of the Act, 21 U.S.C. section 360bbb-3(b)(1), unless the authorization is terminated or revoked.  Performed at [facility], [address], [place], [place] [address]   C Difficile Quick Screen w PCR reflex     Status: None   Collection Time: 10/21/22  5:45 PM   Specimen: Stool  Result Value Ref Range Status   C Diff antigen NEGATIVE NEGATIVE Final   C Diff toxin NEGATIVE NEGATIVE Final   C Diff interpretation No C. difficile detected.  Final    Comment: Performed at [facility], [address], [place], [place] [address]  MRSA Next Gen by PCR, Nasal     Status: None   Collection Time: 10/21/22  5:58 PM   Specimen: Nasal Mucosa; Nasal Swab  Result Value Ref Range Status   MRSA by PCR Next Gen NOT DETECTED NOT DETECTED Final    Comment: (NOTE) The GeneXpert MRSA Assay (FDA approved for NASAL specimens only),  is one component of a comprehensive MRSA colonization surveillance program. It is not intended to diagnose MRSA infection nor to guide or monitor treatment for MRSA infections. Test performance is not FDA approved in patients less than 50 years old. Performed at [facility], [address] [place], [place] [address]     Antimicrobials: Anti-infectives (From admission, onward)    Start     Dose/Rate Route Frequency Ordered Stop   10/21/22 1000  ceFEPIme (MAXIPIME) 2 g in sodium chloride 0.9 % 100 mL IVPB  Status:  Discontinued        2 g 200 mL/hr over 30 Minutes Intravenous Every 8 hours 10/21/22 0804 10/21/22 0811   10/21/22 1000  piperacillin-tazobactam (ZOSYN) IVPB 3.375 g        3.375 g 12.5 mL/hr over 240 Minutes Intravenous Every 8 hours 10/21/22 0811     10/20/22 2200  linezolid (ZYVOX) tablet 600 mg        600 mg Per Tube Every 12 hours 10/20/22 1459 10/22/22 2359      Culture/Microbiology    Component Value Date/Time   SDES   10/03/2022 1609    BLOOD RIGHT HAND Performed at [facility], [address], [place], [place] [address]    SPECREQUEST  10/03/2022 1609    BOTTLES DRAWN AEROBIC AND ANAEROBIC Blood Culture adequate volume Performed at [facility], [address] [place], [place] [address]    CULT  10/03/2022 1609    NO GROWTH 5 DAYS Performed at [facility], [address], [place], [place] [address]    REPTSTATUS 10/08/2022 FINAL 10/03/2022 1609    Other culture-see note  Radiology Studies: CT Angio Chest Pulmonary Embolism (PE) W or WO Contrast  Result Date: 10/21/2022 CLINICAL DATA:  Pulmonary embolism (PE), high prob; Abdominal pain, acute, nonlocalized EXAM: CT ANGIOGRAPHY CHEST CT ABDOMEN AND PELVIS WITH CONTRAST TECHNIQUE: Multidetector CT imaging of the chest was performed using the standard protocol during bolus administration of intravenous contrast. Multiplanar CT image reconstructions and MIPs were obtained to evaluate the vascular anatomy. Multidetector CT imaging of the abdomen and pelvis was performed using the standard protocol during bolus administration of intravenous contrast. RADIATION DOSE REDUCTION: This exam was performed according to the departmental dose-optimization program which includes automated exposure control, adjustment of the mA and/or kV according to patient size and/or use of iterative reconstruction technique. CONTRAST:  OMNIPAQUE IOHEXOL 350 MG/ML SOLN COMPARISON:  CT chest 09/06/2022, CT abdomen pelvis 10/08/2022. FINDINGS: CTA CHEST FINDINGS Cardiovascular: There is adequate opacification of the pulmonary arterial tree. No intraluminal filling defect identified to suggest acute pulmonary embolism. Central pulmonary arteries are of normal caliber. Moderate coronary artery calcification. Global cardiac size within normal limits. No pericardial effusion. Mild atherosclerotic calcification within the thoracic aorta. No aortic aneurysm.  Mediastinum/Nodes: Visualized thyroid is unremarkable. Stable shotty right paratracheal and bilateral hilar adenopathy, nonspecific, possibly reactive in nature. The esophagus is unremarkable. Lungs/Pleura: There is extensive and luminal debris within the tracheobronchial tree with near complete impaction of the left bronchial tree and scattered airway impaction within the right lower lobe. Bibasilar pulmonary infiltrates are present. Together, the findings suggest changes of aspiration. Small bilateral pleural effusions are present, new since prior examination. No pneumothorax. Musculoskeletal: No acute bone abnormality. No lytic or blastic bone lesion. Review of the MIP images confirms the above findings. CT ABDOMEN and PELVIS FINDINGS Hepatobiliary: Cholelithiasis without pericholecystic inflammatory change. Tiny cyst within the right hepatic dome. Liver  otherwise unremarkable. No intra or extrahepatic biliary ductal dilation. Pancreas: Unremarkable. No pancreatic ductal dilatation or surrounding inflammatory changes. Spleen: Normal in size without focal abnormality. Adrenals/Urinary Tract: The adrenal glands are unremarkable. The kidneys are normal in size and position. Simple cortical cyst noted within the lower pole right kidney for which no follow-up imaging is recommended. The kidneys are otherwise unremarkable. Foley catheter balloon is seen within a largely decompressed bladder lumen. There is circumferential marked bladder wall thickening suggesting changes of chronic bladder outlet obstruction, similar to prior examination. Stomach/Bowel: Previously noted button type PEG tube has been exchanged for a balloon retention gastrostomy catheter which is in expected position within the gastric lumen. Infiltration along the tract of the gastrostomy catheter appears improved since prior examination related to leakage or inflammation along the tract. Moderate fluid is again seen within the subcutaneous soft  tissues of the left lateral abdominal wall as well as within the fascial planes of the lateral abdominal wall musculature, increased since prior examination. Stomach, small bowel, and large bowel are otherwise unremarkable. Appendix normal. No free intraperitoneal gas or fluid. Vascular/Lymphatic: Extensive atherosclerotic calcification within the aortoiliac vasculature. No aortic aneurysm. No pathologic adenopathy within the abdomen and pelvis. Reproductive: Marked prostatic hypertrophy. Other: Mild diffuse subcutaneous edema again noted within the gluteal soft tissues and visualized lower extremities bilaterally. Musculoskeletal: No acute bone abnormality. No lytic or blastic bone lesion. Review of the MIP images confirms the above findings. IMPRESSION: 1. No pulmonary embolism. 2. Extensive endoluminal debris within the tracheobronchial tree with near complete impaction of the left bronchial tree and scattered airway impaction within the right lower lobe. Bibasilar pulmonary infiltrates. Together, the findings suggest changes of aspiration. 3. Small bilateral pleural effusions, new since prior examination but improved since CT examination of the abdomen pelvis of 10/08/2022. 4. Moderate coronary artery calcification. 5. Cholelithiasis. 6. Marked prostatic hypertrophy. Changes of chronic bladder outlet obstruction with Foley decompression of the bladder. 7. Interval gastrostomy exchange. Mild subcutaneous infiltration along the gastrostomy catheter tract and moderate fluid within the subcutaneous soft tissues of the left lateral abdominal wall as well as within the fascial planes of the lateral abdominal wall musculature may reflect changes of leakage along an immature tract and appears increased since prior examination. Aortic Atherosclerosis (ICD10-I70.0). Electronically Signed   By: Helyn Numbers M.D.   On: 10/21/2022 02:56   CT ABDOMEN PELVIS W CONTRAST  Result Date: 10/21/2022 CLINICAL DATA:  Pulmonary  embolism (PE), high prob; Abdominal pain, acute, nonlocalized EXAM: CT ANGIOGRAPHY CHEST CT ABDOMEN AND PELVIS WITH CONTRAST TECHNIQUE: Multidetector CT imaging of the chest was performed using the standard protocol during bolus administration of intravenous contrast. Multiplanar CT image reconstructions and MIPs were obtained to evaluate the vascular anatomy. Multidetector CT imaging of the abdomen and pelvis was performed using the standard protocol during bolus administration of intravenous contrast. RADIATION DOSE REDUCTION: This exam was performed according to the departmental dose-optimization program which includes automated exposure control, adjustment of the mA and/or kV according to patient size and/or use of iterative reconstruction technique. CONTRAST:  OMNIPAQUE IOHEXOL 350 MG/ML SOLN COMPARISON:  CT chest 09/06/2022, CT abdomen pelvis 10/08/2022. FINDINGS: CTA CHEST FINDINGS Cardiovascular: There is adequate opacification of the pulmonary arterial tree. No intraluminal filling defect identified to suggest acute pulmonary embolism. Central pulmonary arteries are of normal caliber. Moderate coronary artery calcification. Global cardiac size within normal limits. No pericardial effusion. Mild atherosclerotic calcification within the thoracic aorta. No aortic aneurysm. Mediastinum/Nodes: Visualized thyroid is unremarkable.  Stable shotty right paratracheal and bilateral hilar adenopathy, nonspecific, possibly reactive in nature. The esophagus is unremarkable. Lungs/Pleura: There is extensive and luminal debris within the tracheobronchial tree with near complete impaction of the left bronchial tree and scattered airway impaction within the right lower lobe. Bibasilar pulmonary infiltrates are present. Together, the findings suggest changes of aspiration. Small bilateral pleural effusions are present, new since prior examination. No pneumothorax. Musculoskeletal: No acute bone abnormality. No lytic or  blastic bone lesion. Review of the MIP images confirms the above findings. CT ABDOMEN and PELVIS FINDINGS Hepatobiliary: Cholelithiasis without pericholecystic inflammatory change. Tiny cyst within the right hepatic dome. Liver otherwise unremarkable. No intra or extrahepatic biliary ductal dilation. Pancreas: Unremarkable. No pancreatic ductal dilatation or surrounding inflammatory changes. Spleen: Normal in size without focal abnormality. Adrenals/Urinary Tract: The adrenal glands are unremarkable. The kidneys are normal in size and position. Simple cortical cyst noted within the lower pole right kidney for which no follow-up imaging is recommended. The kidneys are otherwise unremarkable. Foley catheter balloon is seen within a largely decompressed bladder lumen. There is circumferential marked bladder wall thickening suggesting changes of chronic bladder outlet obstruction, similar to prior examination. Stomach/Bowel: Previously noted button type PEG tube has been exchanged for a balloon retention gastrostomy catheter which is in expected position within the gastric lumen. Infiltration along the tract of the gastrostomy catheter appears improved since prior examination related to leakage or inflammation along the tract. Moderate fluid is again seen within the subcutaneous soft tissues of the left lateral abdominal wall as well as within the fascial planes of the lateral abdominal wall musculature, increased since prior examination. Stomach, small bowel, and large bowel are otherwise unremarkable. Appendix normal. No free intraperitoneal gas or fluid. Vascular/Lymphatic: Extensive atherosclerotic calcification within the aortoiliac vasculature. No aortic aneurysm. No pathologic adenopathy within the abdomen and pelvis. Reproductive: Marked prostatic hypertrophy. Other: Mild diffuse subcutaneous edema again noted within the gluteal soft tissues and visualized lower extremities bilaterally. Musculoskeletal: No acute  bone abnormality. No lytic or blastic bone lesion. Review of the MIP images confirms the above findings. IMPRESSION: 1. No pulmonary embolism. 2. Extensive endoluminal debris within the tracheobronchial tree with near complete impaction of the left bronchial tree and scattered airway impaction within the right lower lobe. Bibasilar pulmonary infiltrates. Together, the findings suggest changes of aspiration. 3. Small bilateral pleural effusions, new since prior examination but improved since CT examination of the abdomen pelvis of 10/08/2022. 4. Moderate coronary artery calcification. 5. Cholelithiasis. 6. Marked prostatic hypertrophy. Changes of chronic bladder outlet obstruction with Foley decompression of the bladder. 7. Interval gastrostomy exchange. Mild subcutaneous infiltration along the gastrostomy catheter tract and moderate fluid within the subcutaneous soft tissues of the left lateral abdominal wall as well as within the fascial planes of the lateral abdominal wall musculature may reflect changes of leakage along an immature tract and appears increased since prior examination. Aortic Atherosclerosis (ICD10-I70.0). Electronically Signed   By: Helyn Numbers M.D.   On: 10/21/2022 02:56     LOS: 2 days   Total time spent: 35 minutes  Dorcas Carrow, MD [facility]  10/22/2022, 1:53 PM

## 2022-10-22 NOTE — Plan of Care (Signed)

## 2022-10-22 NOTE — Progress Notes (Signed)
RN notified MD that patient was trying to cough some stuff up but could not. Patient's head of bed greater than 30 degrees and oral suction at the bedside.RN also notified MD due to concern for patient being lethargic, gurgly, slurring words to orientation questions, appearing to have secretions, and coughing real bad. Checked patient's vitals and vitals were fine. Patient's O2 sats were 100% on 2 liters of oxygen. Patient's CBG was 286 when checked. Patient's lung sounds sounded crackly and wheezy. Charge Nurse and Rapid Response RN were made aware. MD put in new orders and told RN to hold tube feeds. Will continue to monitor.

## 2022-10-22 NOTE — Progress Notes (Signed)
Nutrition Follow-up  INTERVENTION:   -Decrease goal of Jevity 1.5 to 60 ml/hr -60 ml Prosource TF 20 daily -Free water flushes: 150 ml every 4 hours (900 ml) -Goal provides 2240 kcals, 111g protein and 1994 ml H2O  -D/c Banatrol, already receiving 30g fiber via Jevity  - Per TOC, patient to discharge to facility which does not have Jevity 1.5, provides Osmolite 1.5. Once patient discharges to facility, can utilize equivalent formula as appropriate.   NUTRITION DIAGNOSIS:   Increased nutrient needs related to wound healing as evidenced by estimated needs.  Ongoing.  GOAL:   Patient will meet greater than or equal to 90% of their needs  Meeting.  MONITOR:   TF tolerance  ASSESSMENT:   77 y.o. admits related to drowsiness and hypoxia. PMH includes: bronchitis, stroke, pyelonephritis, AKI, CAP, CKD, HLD, HTN, PVD, stroke, T2DM. Pt is currently receiving medical management related to anemia.  Patient restarted on tube feedings over the weekend. Started on Jevity 1.5 which can continue until discharged to facility. D/c Banatrol as pt receiving fiber with tube feeding formula. Decreased goal rate back to 60 ml/hr. Palliative care following for GOC, family still wants full code.  Per GI note, recommends IR evaluation for possible J extension to G-tube given aspiration. Will monitor for plan.  Admission weight: 171 lbs No other weights this admission. Daily weights have been ordered via TF protocol.  Medications reviewed.  Labs reviewed: CBGs: 163-294  Diet Order:   Diet Order             Diet NPO time specified  Diet effective now                   EDUCATION NEEDS:   Not appropriate for education at this time  Skin:  Skin Integrity Issues:: Stage II DTI: R heel Stage II: Mid sacrum  Last BM:  8/19 -type 7  Height:   Ht Readings from Last 1 Encounters:  10/20/22 5\' 9"  (1.753 m)    Weight:   Wt Readings from Last 1 Encounters:  10/16/22 78 kg     BMI:  Body mass index is 25.39 kg/m.  Estimated Nutritional Needs:   Kcal:  2100-2300  Protein:  105-115g  Fluid:  2.1L/day  Tilda Franco, MS, RD, LDN Inpatient Clinical Dietitian Contact information available via Amion

## 2022-10-22 NOTE — Progress Notes (Signed)
Subjective: No bleeding.  Objective: Vital signs in last 24 hours: Temp:  [97.9 F (36.6 C)-99.4 F (37.4 C)] 97.9 F (36.6 C) (08/19 0440) Pulse Rate:  [90-100] 98 (08/19 0440) Resp:  [17-19] 19 (08/19 0440) BP: (127-134)/(65-68) 128/65 (08/19 0440) SpO2:  [90 %-98 %] 97 % (08/19 0440) Weight change:  Last BM Date : 10/21/22  PE: GEN:  Minimally interactive, unable to answer questions ABD:  Soft, PEG tube in place  Lab Results: CBC    Component Value Date/Time   WBC 9.3 10/22/2022 0454   RBC 2.56 (L) 10/22/2022 0454   HGB 7.1 (L) 10/22/2022 0454   HGB 12.0 (L) 11/11/2015 1243   HCT 22.6 (L) 10/22/2022 0454   HCT 36.2 (L) 11/11/2015 1243   PLT 123 (L) 10/22/2022 0454   PLT 269 11/11/2015 1243   MCV 88.3 10/22/2022 0454   MCV 92 11/11/2015 1243   MCH 27.7 10/22/2022 0454   MCHC 31.4 10/22/2022 0454   RDW 17.4 (H) 10/22/2022 0454   RDW 15.0 11/11/2015 1243   LYMPHSABS 1.2 10/16/2022 0303   LYMPHSABS 1.8 11/11/2015 1243   MONOABS 0.6 10/16/2022 0303   EOSABS 0.3 10/16/2022 0303   EOSABS 0.3 11/11/2015 1243   BASOSABS 0.1 10/16/2022 0303   BASOSABS 0.0 11/11/2015 1243  CMP     Component Value Date/Time   NA 139 10/22/2022 0454   NA 143 11/11/2015 1243   K 4.8 10/22/2022 0454   CL 107 10/22/2022 0454   CO2 23 10/22/2022 0454   GLUCOSE 287 (H) 10/22/2022 0454   GLUCOSE 337 (H) 12/14/2005 1558   BUN 34 (H) 10/22/2022 0454   BUN 22 11/11/2015 1243   CREATININE 1.19 10/22/2022 0454   CREATININE 1.63 (H) 09/14/2014 1340   CALCIUM 7.8 (L) 10/22/2022 0454   PROT 6.5 10/20/2022 1149   PROT 7.5 11/11/2015 1243   ALBUMIN 2.6 (L) 10/20/2022 1149   ALBUMIN 4.3 11/11/2015 1243   AST 26 10/20/2022 1149   ALT 31 10/20/2022 1149   ALKPHOS 71 10/20/2022 1149   BILITOT 0.6 10/20/2022 1149   BILITOT 0.8 11/11/2015 1243   GFR 67.69 08/13/2011 0959   GFRNONAA >60 10/22/2022 0454   Assessment:   Anemia with hemoccult-positive stool. Dementia.  Possible Lewy  Body. Stroke with recent use Plavix. Recurrent aspiration pneumonia. PEG tube for feeding (given recurrent aspiration).  Plan:  This is a difficult case.   Regarding anemia:  He is not having any overt bleeding.  Options would be restart plavix and follow CBC or stop plavix entirely (risks:benefits to be weighed by neurology and primary team).  He is not a good candidate for endoscopy for multiple reasons: (A) he has recurrent aspiration thus would possibly need intubation (from which weaning would be difficult) and if intubation not done he would be at high risk for procedure-related aspiration; (B) current pneumonia with higher risks for respiratory compromise with sedation; (C) absence of overt bleeding.  I am at least the third different gastroenterologist over the past 1-2 months who feels we should not do endoscopy.  If has recurrent anemia, would be likely less risk to have intermittent occasional blood transfusions rather than the risks inherent (as described above) with an endoscopy. Regarding aspiration:  if felt might be aspirating gastric tube feeds, agree IR consultation for consideration of jejunal tube extension to his already existing gastrostomy tube. PPI (pantoprazole 40 mg daily per tube or IV, or equivalent) daily. [facility] will sign-off; please call with questions; thank you  for the consultation.   Harry [name] 10/22/2022, 11:50 AM   Cell [phone] If no answer or after 5 PM call [phone]

## 2022-10-22 NOTE — Progress Notes (Signed)
Pharmacy Antibiotic Note  Harry [name] is a 77 y.o. male admitted on 10/20/2022 with pneumonia.  Pharmacy has been consulted for Zosyn dosing.   ID: HAP, asp PNA was at [facility] then transported to [facility]  - WBC 9.3 rising, PC 0.54 and rising   Antimicrobials this admission: 8/18 Zosyn>> 8/18 zyvox (on PTA started 8/14 for cellulitis around PEG tube) >> 8/19  Dose adjustments this admission:  Microbiology results: 8/18: MRSA PCR: neg  Plan: Zyvox ends today. Zosyn 3.375g IV q8hr Pharmacy will sign off. Please reconsult for further dosing assitance.     Height: 5\' 9"  (175.3 cm) Weight:  (bed is measuring 103.3kg- not correct) IBW/kg (Calculated) : 70.7  Temp (24hrs), Avg:98.6 F (37 C), Min:97.9 F (36.6 C), Max:99.4 F (37.4 C)  Recent Labs  Lab 10/16/22 0303 10/20/22 1149 10/21/22 0521 10/22/22 0454  WBC 7.2 6.5 7.7 9.3  CREATININE 1.18 0.88 0.92 1.19    Estimated Creatinine Clearance: 53.6 mL/min (by C-G formula based on SCr of 1.19 mg/dL).    Allergies  Allergen Reactions   Lipitor [Atorvastatin] Other (See Comments)    Myalgias   Statins Other (See Comments)    Myalgias  Pt tolerating rosuvastatin 5mg  QD   Pravachol [Pravastatin] Rash     Caralina Nop S. Merilynn Finland, PharmD, BCPS Clinical Staff Pharmacist [email or web address]  Pasty Spillers 10/22/2022 10:03 AM

## 2022-10-22 NOTE — Progress Notes (Signed)
BS 184 MD notified no new orders.

## 2022-10-22 NOTE — Inpatient Diabetes Management (Signed)
Inpatient Diabetes Program Recommendations  AACE/ADA: New Consensus Statement on Inpatient Glycemic Control (2015)  Target Ranges:  Prepandial:   less than 140 mg/dL      Peak postprandial:   less than 180 mg/dL (1-2 hours)      Critically ill patients:  140 - 180 mg/dL   Lab Results  Component Value Date   GLUCAP 283 (H) 10/22/2022   HGBA1C 6.5 (H) 06/02/2022    Review of Glycemic Control  Latest Reference Range & Units 10/21/22 16:50 10/21/22 19:32 10/21/22 23:51 10/22/22 04:13 10/22/22 07:25  Glucose-Capillary 70 - 99 mg/dL 161 (H) 096 (H) 045 (H) 254 (H) 283 (H)   Diabetes history: DM 2 Outpatient Diabetes medications:  Lantus 13 units daily Humalog 0-12 units tid Current orders for Inpatient glycemic control:  None Jevity 70 ml/hr Inpatient Diabetes Program Recommendations:    Note hypoglycemia on admit.  Blood sugars now trending up with resumption of tube feeds.  Consider adding Novolog very sensitive correction (0-6 units) q 4 hours.   Thanks,  Beryl Meager, RN, BC-ADM Inpatient Diabetes Coordinator Pager [phone]  (8a-5p)

## 2022-10-22 NOTE — Progress Notes (Signed)
SLP Cancellation Note  Patient Details Name: Harry [name] MRN: [id number] DOB: 06/20/1945   Cancelled treatment:       Reason Eval/Treat Not Completed: Other (comment) RN and NT in room with patient working with him and per RN, she does not feel he is ready for any PO's at this time. Patient is known to SLP from recent previous admission and during that time, he was not safe for PO's other than trials with SLP. SLP will f/u next date.  Angela Nevin, MA, CCC-SLP Speech Therapy

## 2022-10-22 NOTE — Consult Note (Signed)
WOC Nurse Consult Note: Reason for Consult: PEG, sacrum and bilateral heels Wound type: Unstageable Pressure Injury: sacrum; 30% non viable/70% pink with fibrinous material Deep Tissue Pressure Injury: right heel medially; 3cm x 4cm x 0cm; 100% dark purple with blood filled blister Partial thickness skin ulceration at PEG site; contact dermatitis? ICD-10 CM Codes for Irritant Dermatitis L24B1 - Related to digestive stoma or fistula. 1cm x 1cm at 7-10 o'clock and small area at 5 o'clock (about 0.3cm away from the tube) Pressure Injury POA: Yes Measurement:  see above  Wound bed: see above  Drainage (amount, consistency, odor) none  Periwound: intact  Dressing procedure/placement/frequency: Add low air loss mattress for moisture management and pressure redistribution Add Prevalon boots bilaterally for offloading heels Add  leptospermum honey to sacral wound bed,top with dry dressing, change daily. Single layer layer of xeroform to the affected areas around the PEG, cover with foam.   Discussed POC with bedside nurse.  Re consult if needed, will not follow at this time. Thanks  Braydn Carneiro [employer], RN,CWOCN, CNS, CWON-AP [phone])
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
## 2022-10-23 DIAGNOSIS — D500 Iron deficiency anemia secondary to blood loss (chronic): Secondary | ICD-10-CM | POA: Diagnosis not present

## 2022-10-23 DIAGNOSIS — K9423 Gastrostomy malfunction: Secondary | ICD-10-CM | POA: Diagnosis not present

## 2022-10-23 LAB — PROCALCITONIN: Procalcitonin: 0.62 ng/mL

## 2022-10-23 LAB — TYPE AND SCREEN
ABO/RH(D): B POS
Antibody Screen: NEGATIVE
Unit division: 0
Unit division: 0

## 2022-10-23 LAB — BASIC METABOLIC PANEL
Anion gap: 10 (ref 5–15)
BUN: 30 mg/dL — ABNORMAL HIGH (ref 8–23)
CO2: 22 mmol/L (ref 22–32)
Calcium: 8.1 mg/dL — ABNORMAL LOW (ref 8.9–10.3)
Chloride: 110 mmol/L (ref 98–111)
Creatinine, Ser: 1.19 mg/dL (ref 0.61–1.24)
GFR, Estimated: >60 mL/min (ref >60)
Glucose, Bld: 121 mg/dL — ABNORMAL HIGH (ref 70–99)
Potassium: 3.4 mmol/L — ABNORMAL LOW (ref 3.5–5.1)
Sodium: 142 mmol/L (ref 135–145)

## 2022-10-23 LAB — CBC
HCT: 25 % — ABNORMAL LOW (ref 39.0–52.0)
Hemoglobin: 7.9 g/dL — ABNORMAL LOW (ref 13.0–17.0)
MCH: 27.9 pg (ref 26.0–34.0)
MCHC: 31.6 g/dL (ref 30.0–36.0)
MCV: 88.3 fL (ref 80.0–100.0)
Platelets: 135 10*3/uL — ABNORMAL LOW (ref 150–400)
RBC: 2.83 MIL/uL — ABNORMAL LOW (ref 4.22–5.81)
RDW: 17.2 % — ABNORMAL HIGH (ref 11.5–15.5)
WBC: 7.5 10*3/uL (ref 4.0–10.5)
nRBC: 0 % (ref 0.0–0.2)

## 2022-10-23 LAB — BPAM RBC
Blood Product Expiration Date: 202409082359
Blood Product Expiration Date: 202409112359
ISSUE DATE / TIME: 202408171747
ISSUE DATE / TIME: 202408191551
Unit Type and Rh: 1700
Unit Type and Rh: 7300

## 2022-10-23 LAB — GLUCOSE, CAPILLARY
Glucose-Capillary: 112 mg/dL — ABNORMAL HIGH (ref 70–99)
Glucose-Capillary: 147 mg/dL — ABNORMAL HIGH (ref 70–99)
Glucose-Capillary: 107 mg/dL — ABNORMAL HIGH (ref 70–99)
Glucose-Capillary: 71 mg/dL (ref 70–99)
Glucose-Capillary: 79 mg/dL (ref 70–99)

## 2022-10-23 MED ORDER — ZINC OXIDE 40 % EX OINT
TOPICAL_OINTMENT | CUTANEOUS | Status: DC | PRN
  Filled 2022-10-23: qty 57

## 2022-10-23 MED ORDER — POTASSIUM CHLORIDE 20 MEQ PO PACK
20.0000 meq | PACK | Freq: Two times a day (BID) | ORAL | Status: DC
  Administered 2022-10-23 (×2): 20 meq
  Filled 2022-10-23 (×2): qty 1

## 2022-10-23 NOTE — Progress Notes (Signed)
PROGRESS NOTE KAYMAN STOLZENBURG  MGQ:[id number] DOB: [birth year] DOA: 10/20/2022 PCP: Crist Fat, MD  Brief Narrative/Hospital Course: 77 year old male with history of Lewy body dementia,history of CVA, CKD 3B dysphagia with PEG tube in place, anasarca ,chronic anemia ,hyperlipidemia, diabetes ,hypertension brought to the ED from [facility] with moaning and groaning and with increased agitation,recently diagnosed with pneumonia.  Recently discharged on 8/13 where he was treated for aspiration pneumonia.  On last admission GI had seen for anemia. Had 1 unit prbc on 10/02/22. In the ED: Vitals temp 98.2 heart rate 83 respiration 18-22, BP 151/79, 100% oxygen saturation. Labs showed CMP with BUN 38 creatinine 0.8 normal LFTs, hemoglobin 6.8 platelet 123.  Recent hemoglobin 8.0 on 8/13 platelet 211-previously hemoglobin 8 to 9 g.Rectal bag w/ no blood FOBT+.In ED AAOX2 but drowasy apparently new for him. Resp panel neg.  CXR- w/ improved left hazy base/retrocardiac opacification likely due to small effusion with possible associated atelectasis improved hazy opacification of the right infrahilar region. 1 unit prbc ordered and admission requested. 8/19, worsening respiratory status.  Chest x-ray shows worsening consolidations.  Clinically aspirating.   Subjective:  Patient seen and examined.  Overnight remains with a lot of secretions at his mouth.  Wet cough but unable to expectorate.  Responds minimally.  Afebrile.  Tube feeding on hold since last night.  Remains on maintenance IV fluids.  No family at bedside.   Assessment and Plan: Principal Problem:   Anemia Active Problems:   Need for emotional support   S/P percutaneous endoscopic gastrostomy (PEG) tube placement (HCC)   Aspiration pneumonia (HCC)   Counseling and coordination of care   Anemia requiring transfusions   Palliative care encounter   Anemia, acute on chronic Heme positive stool Possible GI bleeding Tube feed issues- high  residue/loose stool Possible cellulitis abdomen wall/Along the PEG track: Total 2 units on this admit.  Hemoglobin is 7.9.   This is her fourth unit of blood transfusion in 1 month.  Hemodynamically stable.  Plavix on hold.  GI has been consulted as per daughter's request, she desires full scope of treatment. Seen by gastroenterology.  Does not recommend endoscopic evaluation due to high risk of getting intubated and aspiration along with adverse events from the anesthesia. Conservative management advised.  On PPI.   Acute hypoxic respiratory failure Recurrent aspiration pneumonia, worsening aspiration. Patient hypoxic at the facility, needing 2 L nasal cannula.  BNP stable no evidence of fluid overload, D-dimer obtained and was elevated subsequently underwent CT angio chest showing no PE, extensive endoluminal debris's with near complete impaction of the left bronchial tree and scattered airway impaction within the right lower lobe, bibasilar pulmonary infiltrates-findings suggest changes of aspiration. PTA patient was on Zyvox through 8/19.  Currently on Zosyn. Chest x-ray 8/19, worsening infiltrates. Chest physiotherapy as much patient is able to do it.  Completed Zyvox therapy.  Currently on Zosyn.  Keep NPO.  Recent Labs  Lab 10/20/22 1149 10/20/22 1717 10/21/22 0521 10/22/22 0454 10/23/22 0518  WBC 6.5  --  7.7 9.3 7.5  PROCALCITON  --  0.16 0.14 0.54 0.62   Small pleural effusion Minimal ascites on last admission 8/5: CT shows a small bilateral pleural effusion improved from last imaging-likely multifactorial, nutritional given hypoalbuminemia, BNP stable 211.Echo from July of 11/2022 showed EF 60 to 65%, G1 DD.  Augment nutritional status once able  History of stroke on Plavix: Will hold Plavix in the setting of symptomatic anemia and heme positive stool.  Patient is unable to tolerate Plavix.  He has history of stroke and latest stroke 5 months ago. Discussed with family.  Will  discontinue Plavix.  Dysphagia s/p PEG in place Recently discharged to [facility] 7/15 and PEG was placed there: CT abdomen shows: Mild s/c infiltration along the gastrostomy catheter tract and moderate fluid within the s/c soft tissues of the left lateral abdominal wall as well as within the fascial planes of the lateral abdominal wall musculature may reflect changes of leakage along an immature tract and appears increased since prior examination.  He may have some of the tube feeding leaking around the abdominal wall. Tolerates tube feeds but gets more secretions in his throat. Aspiration precautions.  Elevate head of the bed.  Continue IV antibiotics.  Speech therapy evaluation ordered, however he is not ready for swallowing trials.  Hypertension: Blood pressure is stable continue to hold Coreg, amlodipine and continue hydralazine.  Acute metabolic encephalopathy Advanced dementia Lewy body dementia: At baseline alert awake oriented to self follows some commands per daughter.  Previously he had been confused lethargic on last admission.  Very lethargic and minimally responsive on admission.  Remains very lethargic.  Suspect recurrent aspiration hypoglycemia contributed to his lethargy.  Recent head CT/ MRI of the brain on last admission no acute finding. EEG on last admission with mild diffuse encephalopathy nonspecific etiology.   Type 2 diabetes mellitus W/ hypoglycemia: Improved.  Not tolerating tube feeding.  On IV fluids. 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  Continue the same  CKD stage IIIa: BUN/creatinine stable and at about baseline. Recent Labs    10/11/22 0552 10/12/22 0431 10/13/22 0246 10/14/22 0244 10/15/22 0317 10/16/22 0303 10/20/22 1149 10/21/22 0521 10/22/22 0454 10/23/22 0518  BUN 19 22 27* 24* 25* 27* 38* 32* 34*  30*  CREATININE 1.32* 1.29* 1.62* 1.30* 1.22 1.18 0.88 0.92 1.19 1.19  CO2 26 27 26 28 27 25 26 27 23  22    Thrombocytopenia: Likely in the setting of recurrent aspiration infection.  Monitor  Recent Labs  Lab 10/20/22 1149 10/21/22 0521 10/22/22 0454 10/23/22 0518  PLT 123* 127* 123* 135*    Goals of care: Followed by palliative care.  Currently full code. Patient with severe irreversible medical comorbidities.  He is appropriate for palliation and hospice.  His aspiration cannot be treated.  Family has unrealistic expectations.  Pressure Injury 10/02/22 Heel Right Deep Tissue Pressure Injury - Purple or maroon localized area of discolored intact skin or blood-filled blister due to damage of underlying soft tissue from pressure and/or shear. deep purple; skin intact; 4cmx2cm (Active)  10/02/22 0330  Location: Heel  Location Orientation: Right  Staging: Deep Tissue Pressure Injury - Purple or maroon localized area of discolored intact skin or blood-filled blister due to damage of underlying soft tissue from pressure and/or shear.  Wound Description (Comments): deep purple; skin intact; 4cmx2cm  Present on Admission: Yes     Pressure Injury 10/02/22 Heel Right Deep Tissue Pressure Injury - Purple or maroon localized area of discolored intact skin or blood-filled blister due to damage of underlying soft tissue from pressure and/or shear. deep purple; skin intact; 3cmx6cm (Active)  10/02/22 0330  Location: Heel  Location Orientation: Right  Staging: Deep Tissue Pressure Injury - Purple or maroon localized area of discolored intact skin or blood-filled blister due to  damage of underlying soft tissue from pressure and/or shear.  Wound Description (Comments): deep purple; skin intact; 3cmx6cm  Present on Admission: Yes     Pressure Injury 10/02/22 Sacrum Mid Unstageable - Full thickness tissue loss in which the base of the injury is covered by slough (yellow, tan, gray, green or Billard)  and/or eschar (tan, Liss or black) in the wound bed. 4cmx2cm (Active)  10/02/22 0330  Location: Sacrum  Location Orientation: Mid  Staging: Unstageable - Full thickness tissue loss in which the base of the injury is covered by slough (yellow, tan, gray, green or Kocak) and/or eschar (tan, Blaschke or black) in the wound bed. (Was told in report it was a Stage IV; possibly healed St IV?)  Wound Description (Comments): 4cmx2cm  Present on Admission: Yes    DVT prophylaxis: SCDs Start: 10/20/22 1508 Code Status:   Code Status: Full Code Family Communication: none today . Will call   Patient status is: Inpatient because of altered status and anemia, significant aspiration. Level of care: Progressive   Dispo: The patient is from: SNF            Anticipated disposition: Back to skilled nursing facility in 1-2 days  Objective: Vitals last 24 hrs: Vitals:   10/22/22 2007 10/23/22 0427 10/23/22 0500 10/23/22 1153  BP: (!) 150/80 (!) 151/73  131/71  Pulse: 86 88  85  Resp: 19 19  16   Temp: 98.1 F (36.7 C) 97.8 F (36.6 C)  97.8 F (36.6 C)  TempSrc: Oral Oral  Oral  SpO2: 97% 99%  100%  Weight:   79 kg   Height:       Weight change:   Physical Examination:  General: sick looking.  Frail and debilitated.  Not very interactive.  Follows simple commands.  Visibly gurgling. Cardiovascular: S1-S2 normal.  Regular rate rhythm. Respiratory: Conducted upper airway sounds.  On 3 L oxygen. Gastrointestinal: Soft.  Chronic induration around the PEG tube site. Extremely weak. Neuro: Alert and awake but not oriented. Skin: Pressure ulcer on the back and on the legs.  Pictures in the chart.  Medications reviewed:  Scheduled Meds:  artificial tears  1 Application Both Eyes TID   Chlorhexidine Gluconate Cloth  6 each Topical Daily   feeding supplement (PROSource TF20)  60 mL Per Tube Daily   free water  150 mL Per Tube Q4H   hydrALAZINE  25 mg Per Tube Q8H   insulin aspart  0-5 Units  Subcutaneous QHS   insulin aspart  0-9 Units Subcutaneous TID WC   leptospermum manuka honey  1 Application Topical Daily   loratadine  10 mg Per Tube Daily   pantoprazole  40 mg Intravenous Q12H   potassium chloride  20 mEq Per Tube BID   pyridostigmine  30 mg Per Tube Q8H   rosuvastatin  5 mg Per Tube QHS   sertraline  25 mg Per Tube Daily   Continuous Infusions:  dextrose 5% lactated ringers 75 mL/hr at 10/23/22 1027   feeding supplement (JEVITY 1.5 CAL/FIBER) Stopped (10/22/22 1757)   piperacillin-tazobactam (ZOSYN)  IV 3.375 g (10/23/22 0901)   Diet Order             Diet NPO time specified  Diet effective now                   Intake/Output Summary (Last 24 hours) at 10/23/2022 1216 Last data filed at 10/23/2022 0900 Gross per 24 hour  Intake 2459.64 ml  Output 1400 ml  Net 1059.64 ml   Net IO Since Admission: 170.53 mL [10/23/22 1216]  Wt Readings from Last 3 Encounters:  10/23/22 79 kg  10/16/22 78 kg  09/16/22 85.7 kg     Unresulted Labs (From admission, onward)     Start     Ordered   10/22/22 0500  Basic metabolic panel  Daily,   R      10/21/22 1143   10/22/22 0500  CBC  Daily,   R      10/21/22 1143   10/21/22 0500  Procalcitonin  Daily,   R     References:    Procalcitonin Lower Respiratory Tract Infection AND Sepsis Procalcitonin Algorithm   10/20/22 1526          Data Reviewed: I have personally reviewed following labs and imaging studies CBC: Recent Labs  Lab 10/20/22 1149 10/20/22 2253 10/21/22 0521 10/21/22 1012 10/22/22 0454 10/23/22 0518  WBC 6.5  --  7.7  --  9.3 7.5  HGB 6.8* 7.7* 8.3* 7.8* 7.1* 7.9*  HCT 21.5* 24.4* 26.3* 24.9* 22.6* 25.0*  MCV 88.1  --  88.0  --  88.3 88.3  PLT 123*  --  127*  --  123* 135*   Basic Metabolic Panel: Recent Labs  Lab 10/20/22 1149 10/21/22 0521 10/22/22 0454 10/23/22 0518  NA 141 139 139 142  K 4.5 4.0 4.8 3.4*  CL 105 104 107 110  CO2 26 27 23 22   GLUCOSE 104* 27* 287* 121*  BUN  38* 32* 34* 30*  CREATININE 0.88 0.92 1.19 1.19  CALCIUM 8.5* 8.4* 7.8* 8.1*   GFR: Estimated Creatinine Clearance: 53.6 mL/min (by C-G formula based on SCr of 1.19 mg/dL). Liver Function Tests: Recent Labs  Lab 10/20/22 1149  AST 26  ALT 31  ALKPHOS 71  BILITOT 0.6  PROT 6.5  ALBUMIN 2.6*   CBG: Recent Labs  Lab 10/22/22 1217 10/22/22 1706 10/22/22 2132 10/23/22 0719 10/23/22 1154  GLUCAP 294* 286* 155* 112* 147*   Recent Labs  Lab 10/20/22 1717 10/21/22 0521 10/22/22 0454 10/23/22 0518  PROCALCITON 0.16 0.14 0.54 0.62    Recent Results (from the past 240 hour(s))  Resp panel by RT-PCR (RSV, Flu A&B, Covid) Anterior Nasal Swab     Status: None   Collection Time: 10/20/22 11:08 AM   Specimen: Anterior Nasal Swab  Result Value Ref Range Status   SARS Coronavirus 2 by RT PCR NEGATIVE NEGATIVE Final    Comment: (NOTE) SARS-CoV-2 target nucleic acids are NOT DETECTED.  The SARS-CoV-2 RNA is generally detectable in upper respiratory specimens during the acute phase of infection. The lowest concentration of SARS-CoV-2 viral copies this assay can detect is 138 copies/mL. A negative result does not preclude SARS-Cov-2 infection and should not be used as the sole basis for treatment or other patient management decisions. A negative result may occur with  improper specimen collection/handling, submission of specimen other than nasopharyngeal swab, presence of viral mutation(s) within the areas targeted by this assay, and inadequate number of viral copies(<138 copies/mL). A negative result must be combined with clinical observations, patient history, and epidemiological information. The expected result is Negative.  Fact Sheet for Patients:  BloggerCourse.com  Fact Sheet for Healthcare Providers:  SeriousBroker.it  This test is no t yet approved or cleared by the [place] FDA and  has been authorized for  detection and/or diagnosis of SARS-CoV-2 by FDA under an Emergency Use Authorization (EUA). This EUA will  remain  in effect (meaning this test can be used) for the duration of the COVID-19 declaration under Section 564(b)(1) of the Act, 21 U.S.C.section 360bbb-3(b)(1), unless the authorization is terminated  or revoked sooner.       Influenza A by PCR NEGATIVE NEGATIVE Final   Influenza B by PCR NEGATIVE NEGATIVE Final    Comment: (NOTE) The Xpert Xpress SARS-CoV-2/FLU/RSV plus assay is intended as an aid in the diagnosis of influenza from Nasopharyngeal swab specimens and should not be used as a sole basis for treatment. Nasal washings and aspirates are unacceptable for Xpert Xpress SARS-CoV-2/FLU/RSV testing.  Fact Sheet for Patients: BloggerCourse.com  Fact Sheet for Healthcare Providers: SeriousBroker.it  This test is not yet approved or cleared by the [place] FDA and has been authorized for detection and/or diagnosis of SARS-CoV-2 by FDA under an Emergency Use Authorization (EUA). This EUA will remain in effect (meaning this test can be used) for the duration of the COVID-19 declaration under Section 564(b)(1) of the Act, 21 U.S.C. section 360bbb-3(b)(1), unless the authorization is terminated or revoked.     Resp Syncytial Virus by PCR NEGATIVE NEGATIVE Final    Comment: (NOTE) Fact Sheet for Patients: BloggerCourse.com  Fact Sheet for Healthcare Providers: SeriousBroker.it  This test is not yet approved or cleared by the [place] FDA and has been authorized for detection and/or diagnosis of SARS-CoV-2 by FDA under an Emergency Use Authorization (EUA). This EUA will remain in effect (meaning this test can be used) for the duration of the COVID-19 declaration under Section 564(b)(1) of the Act, 21 U.S.C. section 360bbb-3(b)(1), unless the authorization is  terminated or revoked.  Performed at [facility], [address], [place], [place] [address]   C Difficile Quick Screen w PCR reflex     Status: None   Collection Time: 10/21/22  5:45 PM   Specimen: Stool  Result Value Ref Range Status   C Diff antigen NEGATIVE NEGATIVE Final   C Diff toxin NEGATIVE NEGATIVE Final   C Diff interpretation No C. difficile detected.  Final    Comment: Performed at [facility], [address], [place], [place] [address]  MRSA Next Gen by PCR, Nasal     Status: None   Collection Time: 10/21/22  5:58 PM   Specimen: Nasal Mucosa; Nasal Swab  Result Value Ref Range Status   MRSA by PCR Next Gen NOT DETECTED NOT DETECTED Final    Comment: (NOTE) The GeneXpert MRSA Assay (FDA approved for NASAL specimens only), is one component of a comprehensive MRSA colonization surveillance program. It is not intended to diagnose MRSA infection nor to guide or monitor treatment for MRSA infections. Test performance is not FDA approved in patients less than 65 years old. Performed at [facility], [address], [place], [place] [address]   Gastrointestinal Panel by PCR , Stool     Status: Abnormal   Collection Time: 10/21/22  5:58 PM   Specimen: Stool  Result Value Ref Range Status   Campylobacter species NOT DETECTED NOT DETECTED Final   Plesimonas shigelloides NOT DETECTED NOT DETECTED Final   Salmonella species NOT DETECTED NOT DETECTED Final   Yersinia enterocolitica NOT DETECTED NOT DETECTED Final   Vibrio species NOT DETECTED NOT DETECTED Final   Vibrio cholerae NOT DETECTED NOT DETECTED Final   Enteroaggregative E coli (EAEC) NOT DETECTED NOT DETECTED Final   Enteropathogenic E coli (EPEC) NOT DETECTED NOT DETECTED Final   Enterotoxigenic E  coli (ETEC) DETECTED (A) NOT DETECTED Final    Comment: RESULT CALLED TO, READ BACK BY AND VERIFIED WITH: JULIANNE TADDEO RN @2200  10/22/22 ASW    Shiga like  toxin producing E coli (STEC) NOT DETECTED NOT DETECTED Final   Shigella/Enteroinvasive E coli (EIEC) NOT DETECTED NOT DETECTED Final   Cryptosporidium NOT DETECTED NOT DETECTED Final   Cyclospora cayetanensis NOT DETECTED NOT DETECTED Final   Entamoeba histolytica NOT DETECTED NOT DETECTED Final   Giardia lamblia NOT DETECTED NOT DETECTED Final   Adenovirus F40/41 NOT DETECTED NOT DETECTED Final   Astrovirus NOT DETECTED NOT DETECTED Final   Norovirus GI/GII NOT DETECTED NOT DETECTED Final   Rotavirus A NOT DETECTED NOT DETECTED Final   Sapovirus (I, II, IV, and V) NOT DETECTED NOT DETECTED Final    Comment: Performed at [facility], [address] [place], [place] [address]    Antimicrobials: Anti-infectives (From admission, onward)    Start     Dose/Rate Route Frequency Ordered Stop   10/21/22 1000  ceFEPIme (MAXIPIME) 2 g in sodium chloride 0.9 % 100 mL IVPB  Status:  Discontinued        2 g 200 mL/hr over 30 Minutes Intravenous Every 8 hours 10/21/22 0804 10/21/22 0811   10/21/22 1000  piperacillin-tazobactam (ZOSYN) IVPB 3.375 g        3.375 g 12.5 mL/hr over 240 Minutes Intravenous Every 8 hours 10/21/22 0811     10/20/22 2200  linezolid (ZYVOX) tablet 600 mg        600 mg Per Tube Every 12 hours 10/20/22 1459 10/22/22 2359      Culture/Microbiology    Component Value Date/Time   SDES  10/03/2022 1609    BLOOD RIGHT HAND Performed at [facility], [address], [place], [place] [address]    SPECREQUEST  10/03/2022 1609    BOTTLES DRAWN AEROBIC AND ANAEROBIC Blood Culture adequate volume Performed at [facility], [address], [place], [place] [address]    CULT  10/03/2022 1609    NO GROWTH 5 DAYS Performed at [facility], [address] [place], [place] [address]    REPTSTATUS 10/08/2022 FINAL 10/03/2022 1609    Other culture-see note  Radiology Studies: DG CHEST PORT 1 VIEW  Result Date: 10/22/2022 CLINICAL  DATA:  Shortness of breath EXAM: PORTABLE CHEST 1 VIEW COMPARISON:  10/20/2022 FINDINGS: Consolidation throughout the left lung and in the right lower lobe concerning for pneumonia. Heart and mediastinal contours are within normal limits. Small bilateral pleural effusions. No acute bony abnormality. IMPRESSION: Worsening bilateral airspace disease, diffuse on the left and in the right lower lobe concerning for multifocal pneumonia. Small layering bilateral effusions. Electronically Signed   By: Charlett Nose M.D.   On: 10/22/2022 22:17     LOS: 3 days   Total time spent: 35 minutes  Dorcas Carrow, MD [facility]  10/23/2022, 12:16 PM

## 2022-10-23 NOTE — Plan of Care (Signed)
  Problem: Education: Goal: Knowledge of General Education information will improve Description: Including pain rating scale, medication(s)/side effects and non-pharmacologic comfort measures Outcome: Progressing   Problem: Clinical Measurements: Goal: Ability to maintain clinical measurements within normal limits will improve Outcome: Progressing Goal: Diagnostic test results will improve Outcome: Progressing   

## 2022-10-23 NOTE — Progress Notes (Signed)
Patient removed Nasal Cannula, RN tried multiple attempts to put back. Patient refused.

## 2022-10-23 NOTE — Plan of Care (Signed)
  Problem: Health Behavior/Discharge Planning: Goal: Ability to manage health-related needs will improve Outcome: Progressing   Problem: Coping: Goal: Level of anxiety will decrease Outcome: Progressing   

## 2022-10-24 DIAGNOSIS — D500 Iron deficiency anemia secondary to blood loss (chronic): Secondary | ICD-10-CM | POA: Diagnosis not present

## 2022-10-24 DIAGNOSIS — K9423 Gastrostomy malfunction: Secondary | ICD-10-CM | POA: Diagnosis not present

## 2022-10-24 LAB — GLUCOSE, CAPILLARY
Glucose-Capillary: 141 mg/dL — ABNORMAL HIGH (ref 70–99)
Glucose-Capillary: 154 mg/dL — ABNORMAL HIGH (ref 70–99)
Glucose-Capillary: 113 mg/dL — ABNORMAL HIGH (ref 70–99)
Glucose-Capillary: 148 mg/dL — ABNORMAL HIGH (ref 70–99)
Glucose-Capillary: 143 mg/dL — ABNORMAL HIGH (ref 70–99)

## 2022-10-24 LAB — CBC
HCT: 25.2 % — ABNORMAL LOW (ref 39.0–52.0)
Hemoglobin: 8 g/dL — ABNORMAL LOW (ref 13.0–17.0)
MCH: 28.1 pg (ref 26.0–34.0)
MCHC: 31.7 g/dL (ref 30.0–36.0)
MCV: 88.4 fL (ref 80.0–100.0)
Platelets: 151 10*3/uL (ref 150–400)
RBC: 2.85 MIL/uL — ABNORMAL LOW (ref 4.22–5.81)
RDW: 17.6 % — ABNORMAL HIGH (ref 11.5–15.5)
WBC: 7 10*3/uL (ref 4.0–10.5)
nRBC: 0.3 % — ABNORMAL HIGH (ref 0.0–0.2)

## 2022-10-24 LAB — BASIC METABOLIC PANEL
Anion gap: 11 (ref 5–15)
BUN: 21 mg/dL (ref 8–23)
CO2: 21 mmol/L — ABNORMAL LOW (ref 22–32)
Calcium: 8.2 mg/dL — ABNORMAL LOW (ref 8.9–10.3)
Chloride: 107 mmol/L (ref 98–111)
Creatinine, Ser: 1.09 mg/dL (ref 0.61–1.24)
GFR, Estimated: >60 mL/min (ref >60)
Glucose, Bld: 138 mg/dL — ABNORMAL HIGH (ref 70–99)
Potassium: 3.3 mmol/L — ABNORMAL LOW (ref 3.5–5.1)
Sodium: 139 mmol/L (ref 135–145)

## 2022-10-24 LAB — PROCALCITONIN: Procalcitonin: 0.43 ng/mL

## 2022-10-24 MED ORDER — JEVITY 1.5 CAL/FIBER PO LIQD
1000.0000 mL | ORAL | Status: DC
  Administered 2022-10-24: 1000 mL
  Filled 2022-10-24: qty 1000

## 2022-10-24 MED ORDER — POTASSIUM CHLORIDE 20 MEQ PO PACK
40.0000 meq | PACK | Freq: Two times a day (BID) | ORAL | Status: AC
  Administered 2022-10-24 (×2): 40 meq
  Filled 2022-10-24 (×2): qty 2

## 2022-10-24 NOTE — Evaluation (Signed)
SLP Cancellation Note  Patient Details Name: Harry [name] MRN: [id number] DOB: [birth year]   Cancelled treatment:       Reason Eval/Treat Not Completed: Other (comment);Medical issues which prohibited therapy (pt refusing SLP session at this time; will continue efforts; note per RN, pt getting more gurgly today since tube feeding restarted)  Rolena Infante, MS [facility] SLP Acute Rehab Services Office [phone]  Chales Abrahams 10/24/2022, 1:29 PM

## 2022-10-24 NOTE — Plan of Care (Signed)
  Problem: Nutrition: Goal: Adequate nutrition will be maintained Outcome: Progressing   Problem: Coping: Goal: Level of anxiety will decrease Outcome: Progressing   Problem: Pain Managment: Goal: General experience of comfort will improve Outcome: Progressing   

## 2022-10-24 NOTE — Plan of Care (Signed)
  Problem: Education: Goal: Knowledge of General Education information will improve Description: Including pain rating scale, medication(s)/side effects and non-pharmacologic comfort measures Outcome: Progressing   Problem: Clinical Measurements: Goal: Will remain free from infection Outcome: Progressing   Problem: Activity: Goal: Risk for activity intolerance will decrease Outcome: Progressing   

## 2022-10-24 NOTE — Progress Notes (Signed)
PROGRESS NOTE    BAYNE BUEHRER  WNU:[id number] DOB: 1945-11-23 DOA: 10/20/2022 PCP: Crist Fat, MD    Brief Narrative:  77 year old male with history of Lewy body dementia,history of CVA, CKD 3B dysphagia with PEG tube in place, anasarca ,chronic anemia ,hyperlipidemia, diabetes ,hypertension brought to the ED from [facility] with moaning and groaning and with increased agitation,recently diagnosed with pneumonia. Recently discharged on 8/13 where he was treated for aspiration pneumonia. On last admission GI had seen for anemia. Had 1 unit prbc on 10/02/22.  On admission hemodynamically stable.  On room air.  Positive FOBT.  Drowsy.  Respiratory panel negative.  Chest x-ray with improved opacification and small pleural effusion, however repeat chest x-ray with more extensive consolidations.  Hemoglobin 6.8.  Blood transfusion started, started on IV antibiotics and admitted to the hospital.  Remains in very poor clinical status.  Recurrent admissions.  Not tolerating Plavix.  Family has unrealistic expectations.  Starts worsening  respiration every time started back on tube feeding.   Assessment & Plan:   Anemia, acute on chronic Heme positive stool Possible GI bleeding Tube feed issues- high residual and aspiration. Unable to protect secretions. Total 2 units on this admit.  Hemoglobin is fairly stable since replacement. This is his 4th unit of blood transfusion in 1 month.  Hemodynamically stable.  Plavix on hold.  GI has been consulted as per daughter's request, she desires full scope of treatment. Seen by gastroenterology.  Does not recommend endoscopic evaluation due to high risk of getting intubated and aspiration along with adverse events from the anesthesia. Conservative management advised.   Will continue PPI. Discontinue Plavix.   Acute hypoxic respiratory failure Recurrent aspiration pneumonia, worsening aspiration. Patient hypoxic at the facility, needing 2 L nasal  cannula.  BNP stable no evidence of fluid overload, D-dimer obtained and was elevated subsequently underwent CT angio chest showing no PE, extensive endoluminal debris's with near complete impaction of the left bronchial tree and scattered airway impaction within the right lower lobe, bibasilar pulmonary infiltrates-findings suggest changes of aspiration. PTA patient was on Zyvox through 8/19.  Currently on Zosyn. Chest x-ray 8/19, worsening infiltrates. Chest physiotherapy as much patient is able to do it.  Completed Zyvox therapy.  Currently on Zosyn.  Keep NPO.  Not ready for swallowing trials.   Small pleural effusion Minimal ascites on last admission 8/5: CT shows a small bilateral pleural effusion improved from last imaging-likely multifactorial, nutritional given hypoalbuminemia, BNP stable 211.Echo from July of 11/2022 showed EF 60 to 65%, G1 DD.  Augment nutritional status once able.    History of stroke on Plavix: Will hold Plavix in the setting of symptomatic anemia and heme positive stool.  Patient is unable to tolerate Plavix.  He has history of stroke and latest stroke 5 months ago. Discussed with family.  Will discontinue Plavix.   Dysphagia s/p PEG in place Recently discharged to [facility] 7/15 and PEG was placed there: Possible infiltration of feeding formula into abdominal wall. Tolerates tube feeds but gets more secretions in his throat. Aspiration precautions.  Elevate head of the bed.  Continue IV antibiotics.  Rechallenge with tube feeding today, start 30 mL/h.   Hypertension: Blood pressure is stable continue to hold Coreg, amlodipine and continue hydralazine.   Acute metabolic encephalopathy Vascular dementia and also with Lewy body dementia: Patient is very poor clinical status.  Recurrent hospitalizations.  Recurrent stroke.  Bedbound.  Unable to eat.  Keeps getting strokes.  Mostly remains  lethargic and minimally responsive.   Suspect recurrent aspiration  hypoglycemia contributed to his lethargy.  Recent head CT/ MRI of the brain on last admission no acute finding. EEG on last admission with mild diffuse encephalopathy nonspecific etiology.  Does not tolerate antiplatelet therapy, no benefit with more scanning.   Type 2 diabetes mellitus W/ hypoglycemia: Improved.  Challenging with tube feeding today.  Continue maintenance IV fluids.  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  Continue the same.  Exchanged.   CKD stage IIIa: BUN/creatinine stable and at about baseline. Recent Labs (within last 365 days)              Recent Labs    10/11/22 0552 10/12/22 0431 10/13/22 0246 10/14/22 0244 10/15/22 0317 10/16/22 0303 10/20/22 1149 10/21/22 0521 10/22/22 0454 10/23/22 0518  BUN 19 22 27* 24* 25* 27* 38* 32* 34* 30*  CREATININE 1.32* 1.29* 1.62* 1.30* 1.22 1.18 0.88 0.92 1.19 1.19  CO2 26 27 26 28 27 25 26 27 23  22       Diarrheal illness with enterotoxigenic E. coli: Patient has significant diarrhea.  Already on IV Zosyn that showed cover for ETEC.  Symptomatic management for diarrhea.  Contact precautions.  Pressure Injury 10/02/22 Heel Right Deep Tissue Pressure Injury - Purple or maroon localized area of discolored intact skin or blood-filled blister due to damage of underlying soft tissue from pressure and/or shear. deep purple; skin intact; 4cmx2cm (Active)  10/02/22 0330  Location: Heel  Location Orientation: Right  Staging: Deep Tissue Pressure Injury - Purple or maroon localized area of discolored intact skin or blood-filled blister due to damage of underlying soft tissue from pressure and/or shear.  Wound Description (Comments): deep purple; skin intact; 4cmx2cm  Present on Admission: Yes     Pressure Injury 10/02/22 Heel Right Deep Tissue Pressure Injury -  Purple or maroon localized area of discolored intact skin or blood-filled blister due to damage of underlying soft tissue from pressure and/or shear. deep purple; skin intact; 3cmx6cm (Active)  10/02/22 0330  Location: Heel  Location Orientation: Right  Staging: Deep Tissue Pressure Injury - Purple or maroon localized area of discolored intact skin or blood-filled blister due to damage of underlying soft tissue from pressure and/or shear.  Wound Description (Comments): deep purple; skin intact; 3cmx6cm  Present on Admission: Yes     Pressure Injury 10/02/22 Sacrum Mid Unstageable - Full thickness tissue loss in which the base of the injury is covered by slough (yellow, tan, gray, green or Ken[place]) and/or eschar (tan, Mclelland or black) in the wound bed. 4cmx2cm (Active)  10/02/22 0330  Location: Sacrum  Location Orientation: Mid  Staging: Unstageable - Full thickness tissue loss in which the base of the injury is covered by slough (yellow, tan, gray, green or Bobo) and/or eschar (tan, [place] or black) in the wound bed. (Was told in report it was a Stage IV; possibly healed St IV?)  Wound Description (Comments): 4cmx2cm  Present on Admission: Yes      Goal of care: Multiple discussions with patient's daughter.  Clinical updates.  Updated about poor clinical outcome, intolerance to tube feeding, not being ready for any oral intake.  Patient is probably reaching end-of-life, intolerance to tube feeding, intolerance to Plavix to prevent from another  stroke.  Family has unrealistic expectations for this patient's nonreversible medical issues.  He will be best served with palliation and hospice if agreed. Palliative care following.   DVT prophylaxis: SCDs Start: 10/20/22 1508   Code Status: Full code Family Communication: Daughter on the phone 8/20. Disposition Plan: Status is: Inpatient Remains inpatient appropriate because: Intolerance to tube feeding, IV fluids     Consultants:  Palliative  care  Procedures:  None  Antimicrobials:  Zosyn 8/17---   Subjective: Patient seen and examined.  Responds to his name, minimally interactive and follows very simple commands however mostly sleepy and lethargic.  Audible gurgling noise.  Objective: Vitals:   10/23/22 2027 10/24/22 0415 10/24/22 0451 10/24/22 1222  BP: (!) 155/89 (!) 166/84  (!) 161/79  Pulse: 89 96  94  Resp: 18   20  Temp: 97.9 F (36.6 C) 97.6 F (36.4 C)  98.4 F (36.9 C)  TempSrc: Axillary Oral  Oral  SpO2:  96%  92%  Weight:   81 kg   Height:        Intake/Output Summary (Last 24 hours) at 10/24/2022 1338 Last data filed at 10/24/2022 0646 Gross per 24 hour  Intake 1772.46 ml  Output 1025 ml  Net 747.46 ml   Filed Weights   10/22/22 0709 10/23/22 0500 10/24/22 0451  Weight: 78 kg 79 kg 81 kg    Examination:  General: Sick looking.  Frail and debilitated. Alert on a strong stimulation, not oriented.  Gross generalized weakness. Cardiovascular: S1-S2 normal.  Regular rate rhythm. Respiratory: Bilateral conducted upper airway sounds.  Poor inspiratory effort. SpO2: 92 % O2 Flow Rate (L/min): 2 L/min  Gastrointestinal: Soft.  Mild discomfort on palpation around the PEG tube insertion site. Ext: Contracted extremities.  Painful to extension. Stage II sacral cubitus ulcer as above.     Data Reviewed: I have personally reviewed following labs and imaging studies  CBC: Recent Labs  Lab 10/20/22 1149 10/20/22 2253 10/21/22 0521 10/21/22 1012 10/22/22 0454 10/23/22 0518 10/24/22 0455  WBC 6.5  --  7.7  --  9.3 7.5 7.0  HGB 6.8*   < > 8.3* 7.8* 7.1* 7.9* 8.0*  HCT 21.5*   < > 26.3* 24.9* 22.6* 25.0* 25.2*  MCV 88.1  --  88.0  --  88.3 88.3 88.4  PLT 123*  --  127*  --  123* 135* 151   < > = values in this interval not displayed.   Basic Metabolic Panel: Recent Labs  Lab 10/20/22 1149 10/21/22 0521 10/22/22 0454 10/23/22 0518 10/24/22 0455  NA 141 139 139 142 139  K 4.5 4.0  4.8 3.4* 3.3*  CL 105 104 107 110 107  CO2 26 27 23 22  21*  GLUCOSE 104* 27* 287* 121* 138*  BUN 38* 32* 34* 30* 21  CREATININE 0.88 0.92 1.19 1.19 1.09  CALCIUM 8.5* 8.4* 7.8* 8.1* 8.2*   GFR: Estimated Creatinine Clearance: 58.6 mL/min (by C-G formula based on SCr of 1.09 mg/dL). Liver Function Tests: Recent Labs  Lab 10/20/22 1149  AST 26  ALT 31  ALKPHOS 71  BILITOT 0.6  PROT 6.5  ALBUMIN 2.6*   No results for input(s): "LIPASE", "AMYLASE" in the last 168 hours. No results for input(s): "AMMONIA" in the last 168 hours. Coagulation Profile: No results for input(s): "INR", "PROTIME" in the last 168 hours. Cardiac Enzymes: No results for input(s): "CKTOTAL", "CKMB", "CKMBINDEX", "TROPONINI" in the last 168 hours. BNP (last 3 results) No results for  input(s): "PROBNP" in the last 8760 hours. HbA1C: No results for input(s): "HGBA1C" in the last 72 hours. CBG: Recent Labs  Lab 10/23/22 2025 10/23/22 2258 10/24/22 0413 10/24/22 0734 10/24/22 1113  GLUCAP 71 79 141* 154* 113*   Lipid Profile: No results for input(s): "CHOL", "HDL", "LDLCALC", "TRIG", "CHOLHDL", "LDLDIRECT" in the last 72 hours. Thyroid Function Tests: No results for input(s): "TSH", "T4TOTAL", "FREET4", "T3FREE", "THYROIDAB" in the last 72 hours. Anemia Panel: No results for input(s): "VITAMINB12", "FOLATE", "FERRITIN", "TIBC", "IRON", "RETICCTPCT" in the last 72 hours. Sepsis Labs: Recent Labs  Lab 10/21/22 0521 10/22/22 0454 10/23/22 0518 10/24/22 0455  PROCALCITON 0.14 0.54 0.62 0.43    Recent Results (from the past 240 hour(s))  Resp panel by RT-PCR (RSV, Flu A&B, Covid) Anterior Nasal Swab     Status: None   Collection Time: 10/20/22 11:08 AM   Specimen: Anterior Nasal Swab  Result Value Ref Range Status   SARS Coronavirus 2 by RT PCR NEGATIVE NEGATIVE Final    Comment: (NOTE) SARS-CoV-2 target nucleic acids are NOT DETECTED.  The SARS-CoV-2 RNA is generally detectable in upper  respiratory specimens during the acute phase of infection. The lowest concentration of SARS-CoV-2 viral copies this assay can detect is 138 copies/mL. A negative result does not preclude SARS-Cov-2 infection and should not be used as the sole basis for treatment or other patient management decisions. A negative result may occur with  improper specimen collection/handling, submission of specimen other than nasopharyngeal swab, presence of viral mutation(s) within the areas targeted by this assay, and inadequate number of viral copies(<138 copies/mL). A negative result must be combined with clinical observations, patient history, and epidemiological information. The expected result is Negative.  Fact Sheet for Patients:  BloggerCourse.com  Fact Sheet for Healthcare Providers:  SeriousBroker.it  This test is no t yet approved or cleared by the [place] FDA and  has been authorized for detection and/or diagnosis of SARS-CoV-2 by FDA under an Emergency Use Authorization (EUA). This EUA will remain  in effect (meaning this test can be used) for the duration of the COVID-19 declaration under Section 564(b)(1) of the Act, 21 U.S.C.section 360bbb-3(b)(1), unless the authorization is terminated  or revoked sooner.       Influenza A by PCR NEGATIVE NEGATIVE Final   Influenza B by PCR NEGATIVE NEGATIVE Final    Comment: (NOTE) The Xpert Xpress SARS-CoV-2/FLU/RSV plus assay is intended as an aid in the diagnosis of influenza from Nasopharyngeal swab specimens and should not be used as a sole basis for treatment. Nasal washings and aspirates are unacceptable for Xpert Xpress SARS-CoV-2/FLU/RSV testing.  Fact Sheet for Patients: BloggerCourse.com  Fact Sheet for Healthcare Providers: SeriousBroker.it  This test is not yet approved or cleared by the [place] FDA and has been  authorized for detection and/or diagnosis of SARS-CoV-2 by FDA under an Emergency Use Authorization (EUA). This EUA will remain in effect (meaning this test can be used) for the duration of the COVID-19 declaration under Section 564(b)(1) of the Act, 21 U.S.C. section 360bbb-3(b)(1), unless the authorization is terminated or revoked.     Resp Syncytial Virus by PCR NEGATIVE NEGATIVE Final    Comment: (NOTE) Fact Sheet for Patients: BloggerCourse.com  Fact Sheet for Healthcare Providers: SeriousBroker.it  This test is not yet approved or cleared by the [place] FDA and has been authorized for detection and/or diagnosis of SARS-CoV-2 by FDA under an Emergency Use Authorization (EUA). This EUA will remain in effect (meaning this  test can be used) for the duration of the COVID-19 declaration under Section 564(b)(1) of the Act, 21 U.S.C. section 360bbb-3(b)(1), unless the authorization is terminated or revoked.  Performed at [facility], [address], [place], [place] [address]   C Difficile Quick Screen w PCR reflex     Status: None   Collection Time: 10/21/22  5:45 PM   Specimen: Stool  Result Value Ref Range Status   C Diff antigen NEGATIVE NEGATIVE Final   C Diff toxin NEGATIVE NEGATIVE Final   C Diff interpretation No C. difficile detected.  Final    Comment: Performed at [facility], [address] [place], [place] [address]  MRSA Next Gen by PCR, Nasal     Status: None   Collection Time: 10/21/22  5:58 PM   Specimen: Nasal Mucosa; Nasal Swab  Result Value Ref Range Status   MRSA by PCR Next Gen NOT DETECTED NOT DETECTED Final    Comment: (NOTE) The GeneXpert MRSA Assay (FDA approved for NASAL specimens only), is one component of a comprehensive MRSA colonization surveillance program. It is not intended to diagnose MRSA infection nor to guide or monitor treatment for MRSA  infections. Test performance is not FDA approved in patients less than 32 years old. Performed at [facility], [address], [place], [place] [address]   Gastrointestinal Panel by PCR , Stool     Status: Abnormal   Collection Time: 10/21/22  5:58 PM   Specimen: Stool  Result Value Ref Range Status   Campylobacter species NOT DETECTED NOT DETECTED Final   Plesimonas shigelloides NOT DETECTED NOT DETECTED Final   Salmonella species NOT DETECTED NOT DETECTED Final   Yersinia enterocolitica NOT DETECTED NOT DETECTED Final   Vibrio species NOT DETECTED NOT DETECTED Final   Vibrio cholerae NOT DETECTED NOT DETECTED Final   Enteroaggregative E coli (EAEC) NOT DETECTED NOT DETECTED Final   Enteropathogenic E coli (EPEC) NOT DETECTED NOT DETECTED Final   Enterotoxigenic E coli (ETEC) DETECTED (A) NOT DETECTED Final    Comment: RESULT CALLED TO, READ BACK BY AND VERIFIED WITH: JULIANNE TADDEO RN @2200  10/22/22 ASW    Shiga like toxin producing E coli (STEC) NOT DETECTED NOT DETECTED Final   Shigella/Enteroinvasive E coli (EIEC) NOT DETECTED NOT DETECTED Final   Cryptosporidium NOT DETECTED NOT DETECTED Final   Cyclospora cayetanensis NOT DETECTED NOT DETECTED Final   Entamoeba histolytica NOT DETECTED NOT DETECTED Final   Giardia lamblia NOT DETECTED NOT DETECTED Final   Adenovirus F40/41 NOT DETECTED NOT DETECTED Final   Astrovirus NOT DETECTED NOT DETECTED Final   Norovirus GI/GII NOT DETECTED NOT DETECTED Final   Rotavirus A NOT DETECTED NOT DETECTED Final   Sapovirus (I, II, IV, and V) NOT DETECTED NOT DETECTED Final    Comment: Performed at [facility], [address], [place], [place] [address]         Radiology Studies: DG CHEST PORT 1 VIEW  Result Date: 10/22/2022 CLINICAL DATA:  Shortness of breath EXAM: PORTABLE CHEST 1 VIEW COMPARISON:  10/20/2022 FINDINGS: Consolidation throughout the left lung and in the right lower lobe concerning for  pneumonia. Heart and mediastinal contours are within normal limits. Small bilateral pleural effusions. No acute bony abnormality. IMPRESSION: Worsening bilateral airspace disease, diffuse on the left and in the right lower lobe concerning for multifocal pneumonia. Small layering bilateral effusions. Electronically Signed   By: Charlett Nose M.D.   On: 10/22/2022 22:17  Scheduled Meds:  artificial tears  1 Application Both Eyes TID   Chlorhexidine Gluconate Cloth  6 each Topical Daily   feeding supplement (PROSource TF20)  60 mL Per Tube Daily   free water  150 mL Per Tube Q4H   hydrALAZINE  25 mg Per Tube Q8H   insulin aspart  0-5 Units Subcutaneous QHS   insulin aspart  0-9 Units Subcutaneous TID WC   leptospermum manuka honey  1 Application Topical Daily   loratadine  10 mg Per Tube Daily   pantoprazole  40 mg Intravenous Q12H   potassium chloride  40 mEq Per Tube BID   pyridostigmine  30 mg Per Tube Q8H   rosuvastatin  5 mg Per Tube QHS   sertraline  25 mg Per Tube Daily   Continuous Infusions:  dextrose 5% lactated ringers 75 mL/hr at 10/24/22 1300   feeding supplement (JEVITY 1.5 CAL/FIBER) 1,000 mL (10/24/22 0850)   piperacillin-tazobactam (ZOSYN)  IV 3.375 g (10/24/22 0912)     LOS: 4 days    Time spent: 35 minutes    Dorcas Carrow, MD [facility]

## 2022-10-25 DIAGNOSIS — K9423 Gastrostomy malfunction: Secondary | ICD-10-CM | POA: Diagnosis not present

## 2022-10-25 DIAGNOSIS — D500 Iron deficiency anemia secondary to blood loss (chronic): Secondary | ICD-10-CM | POA: Diagnosis not present

## 2022-10-25 LAB — CBC
HCT: 26.2 % — ABNORMAL LOW (ref 39.0–52.0)
Hemoglobin: 8.3 g/dL — ABNORMAL LOW (ref 13.0–17.0)
MCH: 27.7 pg (ref 26.0–34.0)
MCHC: 31.7 g/dL (ref 30.0–36.0)
MCV: 87.3 fL (ref 80.0–100.0)
Platelets: 153 10*3/uL (ref 150–400)
RBC: 3 MIL/uL — ABNORMAL LOW (ref 4.22–5.81)
RDW: 17.6 % — ABNORMAL HIGH (ref 11.5–15.5)
WBC: 6.4 10*3/uL (ref 4.0–10.5)
nRBC: 0 % (ref 0.0–0.2)

## 2022-10-25 LAB — BASIC METABOLIC PANEL
Anion gap: 8 (ref 5–15)
BUN: 17 mg/dL (ref 8–23)
CO2: 22 mmol/L (ref 22–32)
Calcium: 8.1 mg/dL — ABNORMAL LOW (ref 8.9–10.3)
Chloride: 109 mmol/L (ref 98–111)
Creatinine, Ser: 1.09 mg/dL (ref 0.61–1.24)
GFR, Estimated: >60 mL/min (ref >60)
Glucose, Bld: 200 mg/dL — ABNORMAL HIGH (ref 70–99)
Potassium: 3.6 mmol/L (ref 3.5–5.1)
Sodium: 139 mmol/L (ref 135–145)

## 2022-10-25 LAB — GLUCOSE, CAPILLARY
Glucose-Capillary: 119 mg/dL — ABNORMAL HIGH (ref 70–99)
Glucose-Capillary: 126 mg/dL — ABNORMAL HIGH (ref 70–99)
Glucose-Capillary: 158 mg/dL — ABNORMAL HIGH (ref 70–99)
Glucose-Capillary: 165 mg/dL — ABNORMAL HIGH (ref 70–99)
Glucose-Capillary: 178 mg/dL — ABNORMAL HIGH (ref 70–99)
Glucose-Capillary: 207 mg/dL — ABNORMAL HIGH (ref 70–99)
Glucose-Capillary: 207 mg/dL — ABNORMAL HIGH (ref 70–99)

## 2022-10-25 LAB — PROCALCITONIN: Procalcitonin: 0.28 ng/mL

## 2022-10-25 MED ORDER — PANTOPRAZOLE SODIUM 40 MG IV SOLR
40.0000 mg | INTRAVENOUS | Status: DC
  Administered 2022-10-26 – 2022-10-28 (×3): 40 mg via INTRAVENOUS
  Filled 2022-10-25 (×3): qty 10

## 2022-10-25 MED ORDER — PANTOPRAZOLE SODIUM 40 MG PO TBEC: 40.0000 mg | DELAYED_RELEASE_TABLET | Freq: Two times a day (BID) | ORAL | Status: DC

## 2022-10-25 MED ORDER — ASPIRIN 81 MG PO CHEW
81.0000 mg | CHEWABLE_TABLET | Freq: Every day | ORAL | Status: DC
  Administered 2022-10-25 – 2022-10-27 (×3): 81 mg
  Filled 2022-10-25 (×3): qty 1

## 2022-10-25 MED ORDER — JEVITY 1.5 CAL/FIBER PO LIQD
1000.0000 mL | ORAL | Status: DC
  Administered 2022-10-25: 1000 mL
  Filled 2022-10-25 (×2): qty 1000

## 2022-10-25 NOTE — Plan of Care (Signed)
?  Problem: Health Behavior/Discharge Planning: ?Goal: Ability to manage health-related needs will improve ?Outcome: Progressing ?  ?Problem: Coping: ?Goal: Level of anxiety will decrease ?Outcome: Progressing ?  ?Problem: Safety: ?Goal: Ability to remain free from injury will improve ?Outcome: Progressing ?  ?

## 2022-10-25 NOTE — TOC Progression Note (Signed)
Transition of Care [facility]) - Progression Note    Patient Details  Name: Harry [name] MRN: [id number] Date of Birth: 12/14/1945  Transition of [facility]) CM/SW Contact  Lirio Bach, Olegario Messier, RN Phone Number: 10/25/2022, 1:03 PM  Clinical Narrative:  MD plans to d/c in am-[address] LTC-rep Starr aware of return-PEG/osmolite. Await  d/c summary in am prior PTAR.    Expected Discharge Plan: Long Term Nursing Home Barriers to Discharge: Continued Medical Work up  Expected Discharge Plan and Services   Discharge Planning Services: CM Consult Post Acute Care Choice: Nursing Home Living arrangements for the past 2 months:  (LTC)                                       Social Determinants of Health (SDOH) Interventions SDOH Screenings   Food Insecurity: No Food Insecurity (10/20/2022)  Housing: Patient Unable To Answer (10/20/2022)  Transportation Needs: No Transportation Needs (10/20/2022)  Utilities: Not At Risk (10/20/2022)  Depression (PHQ2-9): Low Risk  (03/01/2020)  Financial Resource Strain: Low Risk  (09/29/2018)  Physical Activity: Inactive (09/29/2018)  Tobacco Use: Medium Risk (10/20/2022)    Readmission Risk Interventions    10/22/2022   11:00 AM 10/21/2022    9:19 AM 10/08/2022    1:37 PM  Readmission Risk Prevention Plan  Transportation Screening Complete Complete Complete  PCP or Specialist Appt within 3-5 Days  Complete   HRI or Home Care Consult  Complete   Social Work Consult for Recovery Care Planning/Counseling  Complete   Palliative Care Screening  Not Complete   Medication Review [employer]) Complete Not Complete Complete  PCP or Specialist appointment within 3-5 days of discharge Complete  Complete  HRI or Home Care Consult Complete    SW Recovery Care/Counseling Consult Complete    Palliative Care Screening Complete    Skilled Nursing Facility Not Applicable
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
## 2022-10-25 NOTE — Progress Notes (Signed)
PROGRESS NOTE    Harry [name]  ZOX:[id number] DOB: November 17, 1945 DOA: 10/20/2022 PCP: Crist Fat, MD    Brief Narrative:  77 year old male with history of Lewy body dementia,history of CVA, CKD 3B dysphagia with PEG tube in place, anasarca ,chronic anemia ,hyperlipidemia, diabetes ,hypertension brought to the ED from [facility] with moaning and groaning and with increased agitation,recently diagnosed with pneumonia. Recently discharged on 8/13 where he was treated for aspiration pneumonia. On last admission GI had seen for anemia. Had 1 unit prbc on 10/02/22.  On admission hemodynamically stable.  On room air.  Positive FOBT.  Drowsy.  Respiratory panel negative.  Chest x-ray with improved opacification and small pleural effusion, however repeat chest x-ray with more extensive consolidations.  Hemoglobin 6.8.  Blood transfusion started, started on IV antibiotics and admitted to the hospital.  Remains in very poor clinical status.  Recurrent admissions.  Not tolerating Plavix.  Family has unrealistic expectations.  Starts worsening  respiration every time started back on tube feeding.   Assessment & Plan:   Anemia, acute on chronic Heme positive stool Possible GI bleeding Tube feeding  issues- high residual and aspiration. Unable to protect secretions on and off. Total 2 units on this admit.  Hemoglobin is fairly stable since replacement. This is his 4th unit of blood transfusion in 1 month.  Hemodynamically stable.  Plavix on hold.  GI has been consulted as per daughter's request, she desires full scope of treatment. Seen by gastroenterology.  Does not recommend endoscopic evaluation due to high risk of getting intubated and aspiration along with adverse events from the anesthesia. Conservative management advised.   Will continue PPI. Discontinue Plavix. See discussion below for antiplatelet therapy.  Will start patient on aspirin.   Acute hypoxic respiratory failure Recurrent  aspiration pneumonia, worsening aspiration. Patient hypoxic at the facility, needing 2 L nasal cannula.  BNP stable no evidence of fluid overload, D-dimer obtained and was elevated subsequently underwent CT angio chest showing no PE, extensive endoluminal debris's with near complete impaction of the left bronchial tree and scattered airway impaction within the right lower lobe, bibasilar pulmonary infiltrates-findings suggest changes of aspiration. PTA patient was on Zyvox through 8/19.  Currently on Zosyn. Chest x-ray 8/19, worsening infiltrates. Chest physiotherapy as much patient is able to do it.   Currently on Zosyn.  Keep NPO.  Not ready for swallowing trials. Will change to prolonged course of Augmentin on discharge. Remains high risk of aspiration on his own secretions.   Small pleural effusion Minimal ascites on last admission 8/5: CT shows a small bilateral pleural effusion improved from last imaging-likely multifactorial, nutritional given hypoalbuminemia, BNP stable 211.Echo from July of 11/2022 showed EF 60 to 65%, G1 DD.  Augment nutritional status once able.    History of stroke on Plavix: Unable to tolerate Plavix.  Comes back with GI bleeding.  Unable to have procedure.   At this point not sure any antiplatelet therapy will change his outcome.   Since patient is not tolerating Plavix, we had detailed conversation with patient's daughter.  Will challenge with aspirin 81 mg daily.   He has history of stroke and latest stroke 5 months ago. Discussed with family.  Will discontinue Plavix and start aspirin as alternative suboptimal treatment.  He is high risk of getting another stroke.   Dysphagia s/p PEG in place Recently discharged to [facility] 7/15 and PEG was placed there: Possible infiltration of feeding formula into abdominal wall. Tolerates tube feeds but  gets more secretions in his throat. Aspiration precautions.  Elevate head of the bed.  Continue IV antibiotics.  Tolerated  tube feeding challenge 30 mL/h last 24 hours.  Increased today to 50 mL/h.  If tolerates stable for discharge.   Hypertension: Blood pressure is stable . continue to hold Coreg, amlodipine and continue hydralazine.   Acute metabolic encephalopathy Vascular dementia and also with Lewy body dementia: Patient is very poor clinical status.  Recurrent hospitalizations.  Recurrent stroke.  Bedbound.  Unable to eat.  Keeps getting strokes.  Mostly remains lethargic and minimally responsive.   Suspect recurrent aspiration, hypoglycemia contributed to his lethargy.  Recent head CT/ MRI of the brain on last admission no acute finding. EEG on last admission with mild diffuse encephalopathy nonspecific etiology.  Does not tolerate antiplatelet therapy, trying aspirin alone.   Type 2 diabetes mellitus W/ hypoglycemia: Improved.  Challenging with tube feeding today.  Discontinue maintenance fluids and monitor blood sugars. 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  Continue the same.  Exchanged.   CKD stage IIIa: BUN/creatinine stable and at about baseline. Recent Labs (within last 365 days)              Recent Labs    10/11/22 0552 10/12/22 0431 10/13/22 0246 10/14/22 0244 10/15/22 0317 10/16/22 0303 10/20/22 1149 10/21/22 0521 10/22/22 0454 10/23/22 0518  BUN 19 22 27* 24* 25* 27* 38* 32* 34* 30*  CREATININE 1.32* 1.29* 1.62* 1.30* 1.22 1.18 0.88 0.92 1.19 1.19  CO2 26 27 26 28 27 25 26 27 23  22       Diarrheal illness with enterotoxigenic E. coli: Patient has significant diarrhea.  Already on IV Zosyn that showed cover for ETEC.  Symptomatic management for diarrhea.  Contact precautions.  Some improvement today.  Pressure Injury 10/02/22 Heel Right Deep Tissue Pressure Injury - Purple or maroon localized area of discolored  intact skin or blood-filled blister due to damage of underlying soft tissue from pressure and/or shear. deep purple; skin intact; 4cmx2cm (Active)  10/02/22 0330  Location: Heel  Location Orientation: Right  Staging: Deep Tissue Pressure Injury - Purple or maroon localized area of discolored intact skin or blood-filled blister due to damage of underlying soft tissue from pressure and/or shear.  Wound Description (Comments): deep purple; skin intact; 4cmx2cm  Present on Admission: Yes     Pressure Injury 10/02/22 Heel Right Deep Tissue Pressure Injury - Purple or maroon localized area of discolored intact skin or blood-filled blister due to damage of underlying soft tissue from pressure and/or shear. deep purple; skin intact; 3cmx6cm (Active)  10/02/22 0330  Location: Heel  Location Orientation: Right  Staging: Deep Tissue Pressure Injury - Purple or maroon localized area of discolored intact skin or blood-filled blister due to damage of underlying soft tissue from pressure and/or shear.  Wound Description (Comments): deep purple; skin intact; 3cmx6cm  Present on Admission: Yes     Pressure Injury 10/02/22 Sacrum Mid Unstageable - Full thickness tissue loss in which the base of the injury is covered by slough (yellow, tan, gray, green or [place]) and/or eschar (tan, [place] or black) in the wound bed. 4cmx2cm (Active)  10/02/22 0330  Location: Sacrum  Location Orientation: Mid  Staging: Unstageable - Full thickness tissue loss in which  the base of the injury is covered by slough (yellow, tan, gray, green or [place]) and/or eschar (tan, [place] or black) in the wound bed. (Was told in report it was a Stage IV; possibly healed St IV?)  Wound Description (Comments): 4cmx2cm  Present on Admission: Yes      Goal of care:  Multiple discussions with patient's daughter.  Clinical updates.  Updated about poor clinical outcome, intolerance to tube feeding, not being ready for any oral intake.  Patient is  probably reaching end-of-life, intolerance to tube feeding, intolerance to Plavix to prevent from another stroke.  Family wants to keep bringing him to the hospital and give him as full code, provide all aggressive care.  He will be best served with palliation and hospice if agreed. Palliative care was consulted.  No change in goal of care.  Increase tube feeding, if tolerates possible discharge tomorrow morning.   DVT prophylaxis: SCDs Start: 10/20/22 1508   Code Status: Full code Family Communication: Daughter on the phone.  Called and updated.  Possible discharge for the morning. Disposition Plan: Status is: Inpatient Remains inpatient appropriate because: Intolerance to tube feeding, IV fluids     Consultants:  Palliative care  Procedures:  None  Antimicrobials:  Zosyn 8/17---   Subjective:  Seen and examined.  No overnight events.  He still has diarrhea but less than before.  Patient is able to respond to his name.  He answers "fine" to everything asked.  He coughs and aspirates even on nectar thick liquid.  Afebrile.  On room air.  Objective: Vitals:   10/24/22 2204 10/24/22 2206 10/25/22 0416 10/25/22 0700  BP: (!) 162/81 (!) 162/81 (!) 176/96   Pulse:  94 98   Resp:  16 (!) 24   Temp:  98.7 F (37.1 C)    TempSrc:  Oral Oral   SpO2:  92% 91%   Weight:    88 kg  Height:        Intake/Output Summary (Last 24 hours) at 10/25/2022 1143 Last data filed at 10/25/2022 0423 Gross per 24 hour  Intake 2032.91 ml  Output 1200 ml  Net 832.91 ml   Filed Weights   10/23/22 0500 10/24/22 0451 10/25/22 0700  Weight: 79 kg 81 kg 88 kg    Examination:  General: Very frail and debilitated.  Alert on a strong stimulation and awake.  Not oriented. Gross generalized weakness.  Left more than right. Cardiovascular: S1-S2 normal.  Regular rate rhythm. Respiratory: Bilateral conducted upper airway sounds.  Poor inspiratory effort. SpO2: 91 % O2 Flow Rate (L/min): 1.5 L/min   Gastrointestinal: Soft.  Mild discomfort on palpation around the PEG tube insertion site. Ext: Contracted extremities.  Painful to extension. Stage II sacral cubitus ulcer as above.     Data Reviewed: I have personally reviewed following labs and imaging studies  CBC: Recent Labs  Lab 10/21/22 0521 10/21/22 1012 10/22/22 0454 10/23/22 0518 10/24/22 0455 10/25/22 0515  WBC 7.7  --  9.3 7.5 7.0 6.4  HGB 8.3* 7.8* 7.1* 7.9* 8.0* 8.3*  HCT 26.3* 24.9* 22.6* 25.0* 25.2* 26.2*  MCV 88.0  --  88.3 88.3 88.4 87.3  PLT 127*  --  123* 135* 151 153   Basic Metabolic Panel: Recent Labs  Lab 10/21/22 0521 10/22/22 0454 10/23/22 0518 10/24/22 0455 10/25/22 0515  NA 139 139 142 139 139  K 4.0 4.8 3.4* 3.3* 3.6  CL 104 107 110 107 109  CO2 27 23 22  21* 22  GLUCOSE 27* 287* 121* 138* 200*  BUN 32* 34* 30* 21 17  CREATININE 0.92 1.19 1.19 1.09 1.09  CALCIUM 8.4* 7.8* 8.1* 8.2* 8.1*   GFR: Estimated Creatinine Clearance: 64.3 mL/min (by C-G formula based on SCr of 1.09 mg/dL). Liver Function Tests: Recent Labs  Lab 10/20/22 1149  AST 26  ALT 31  ALKPHOS 71  BILITOT 0.6  PROT 6.5  ALBUMIN 2.6*   No results for input(s): "LIPASE", "AMYLASE" in the last 168 hours. No results for input(s): "AMMONIA" in the last 168 hours. Coagulation Profile: No results for input(s): "INR", "PROTIME" in the last 168 hours. Cardiac Enzymes: No results for input(s): "CKTOTAL", "CKMB", "CKMBINDEX", "TROPONINI" in the last 168 hours. BNP (last 3 results) No results for input(s): "PROBNP" in the last 8760 hours. HbA1C: No results for input(s): "HGBA1C" in the last 72 hours. CBG: Recent Labs  Lab 10/24/22 2208 10/25/22 0028 10/25/22 0407 10/25/22 0727 10/25/22 1127  GLUCAP 143* 158* 178* 207* 207*   Lipid Profile: No results for input(s): "CHOL", "HDL", "LDLCALC", "TRIG", "CHOLHDL", "LDLDIRECT" in the last 72 hours. Thyroid Function Tests: No results for input(s): "TSH", "T4TOTAL",  "FREET4", "T3FREE", "THYROIDAB" in the last 72 hours. Anemia Panel: No results for input(s): "VITAMINB12", "FOLATE", "FERRITIN", "TIBC", "IRON", "RETICCTPCT" in the last 72 hours. Sepsis Labs: Recent Labs  Lab 10/22/22 0454 10/23/22 0518 10/24/22 0455 10/25/22 0515  PROCALCITON 0.54 0.62 0.43 0.28    Recent Results (from the past 240 hour(s))  Resp panel by RT-PCR (RSV, Flu A&B, Covid) Anterior Nasal Swab     Status: None   Collection Time: 10/20/22 11:08 AM   Specimen: Anterior Nasal Swab  Result Value Ref Range Status   SARS Coronavirus 2 by RT PCR NEGATIVE NEGATIVE Final    Comment: (NOTE) SARS-CoV-2 target nucleic acids are NOT DETECTED.  The SARS-CoV-2 RNA is generally detectable in upper respiratory specimens during the acute phase of infection. The lowest concentration of SARS-CoV-2 viral copies this assay can detect is 138 copies/mL. A negative result does not preclude SARS-Cov-2 infection and should not be used as the sole basis for treatment or other patient management decisions. A negative result may occur with  improper specimen collection/handling, submission of specimen other than nasopharyngeal swab, presence of viral mutation(s) within the areas targeted by this assay, and inadequate number of viral copies(<138 copies/mL). A negative result must be combined with clinical observations, patient history, and epidemiological information. The expected result is Negative.  Fact Sheet for Patients:  BloggerCourse.com  Fact Sheet for Healthcare Providers:  SeriousBroker.it  This test is no t yet approved or cleared by the [place] FDA and  has been authorized for detection and/or diagnosis of SARS-CoV-2 by FDA under an Emergency Use Authorization (EUA). This EUA will remain  in effect (meaning this test can be used) for the duration of the COVID-19 declaration under Section 564(b)(1) of the Act,  21 U.S.C.section 360bbb-3(b)(1), unless the authorization is terminated  or revoked sooner.       Influenza A by PCR NEGATIVE NEGATIVE Final   Influenza B by PCR NEGATIVE NEGATIVE Final    Comment: (NOTE) The Xpert Xpress SARS-CoV-2/FLU/RSV plus assay is intended as an aid in the diagnosis of influenza from Nasopharyngeal swab specimens and should not be used as a sole basis for treatment. Nasal washings and aspirates are unacceptable for Xpert Xpress SARS-CoV-2/FLU/RSV testing.  Fact Sheet for Patients: BloggerCourse.com  Fact Sheet for Healthcare Providers: SeriousBroker.it  This test is not yet approved or  cleared by the [place] and has been authorized for detection and/or diagnosis of SARS-CoV-2 by FDA under an Emergency Use Authorization (EUA). This EUA will remain in effect (meaning this test can be used) for the duration of the COVID-19 declaration under Section 564(b)(1) of the Act, 21 U.S.C. section 360bbb-3(b)(1), unless the authorization is terminated or revoked.     Resp Syncytial Virus by PCR NEGATIVE NEGATIVE Final    Comment: (NOTE) Fact Sheet for Patients: BloggerCourse.com  Fact Sheet for Healthcare Providers: SeriousBroker.it  This test is not yet approved or cleared by the [place] FDA and has been authorized for detection and/or diagnosis of SARS-CoV-2 by FDA under an Emergency Use Authorization (EUA). This EUA will remain in effect (meaning this test can be used) for the duration of the COVID-19 declaration under Section 564(b)(1) of the Act, 21 U.S.C. section 360bbb-3(b)(1), unless the authorization is terminated or revoked.  Performed at [facility], [address] [place], [place] [address]   C Difficile Quick Screen w PCR reflex     Status: None   Collection Time: 10/21/22  5:45 PM   Specimen: Stool  Result  Value Ref Range Status   C Diff antigen NEGATIVE NEGATIVE Final   C Diff toxin NEGATIVE NEGATIVE Final   C Diff interpretation No C. difficile detected.  Final    Comment: Performed at [facility], [address], [place], [place] [address]  MRSA Next Gen by PCR, Nasal     Status: None   Collection Time: 10/21/22  5:58 PM   Specimen: Nasal Mucosa; Nasal Swab  Result Value Ref Range Status   MRSA by PCR Next Gen NOT DETECTED NOT DETECTED Final    Comment: (NOTE) The GeneXpert MRSA Assay (FDA approved for NASAL specimens only), is one component of a comprehensive MRSA colonization surveillance program. It is not intended to diagnose MRSA infection nor to guide or monitor treatment for MRSA infections. Test performance is not FDA approved in patients less than 70 years old. Performed at [facility], [address] [place], [place] [address]   Gastrointestinal Panel by PCR , Stool     Status: Abnormal   Collection Time: 10/21/22  5:58 PM   Specimen: Stool  Result Value Ref Range Status   Campylobacter species NOT DETECTED NOT DETECTED Final   Plesimonas shigelloides NOT DETECTED NOT DETECTED Final   Salmonella species NOT DETECTED NOT DETECTED Final   Yersinia enterocolitica NOT DETECTED NOT DETECTED Final   Vibrio species NOT DETECTED NOT DETECTED Final   Vibrio cholerae NOT DETECTED NOT DETECTED Final   Enteroaggregative E coli (EAEC) NOT DETECTED NOT DETECTED Final   Enteropathogenic E coli (EPEC) NOT DETECTED NOT DETECTED Final   Enterotoxigenic E coli (ETEC) DETECTED (A) NOT DETECTED Final    Comment: RESULT CALLED TO, READ BACK BY AND VERIFIED WITH: JULIANNE TADDEO RN @2200  10/22/22 ASW    Shiga like toxin producing E coli (STEC) NOT DETECTED NOT DETECTED Final   Shigella/Enteroinvasive E coli (EIEC) NOT DETECTED NOT DETECTED Final   Cryptosporidium NOT DETECTED NOT DETECTED Final   Cyclospora cayetanensis NOT DETECTED NOT DETECTED Final    Entamoeba histolytica NOT DETECTED NOT DETECTED Final   Giardia lamblia NOT DETECTED NOT DETECTED Final   Adenovirus F40/41 NOT DETECTED NOT DETECTED Final   Astrovirus NOT DETECTED NOT DETECTED Final   Norovirus GI/GII NOT DETECTED NOT DETECTED Final   Rotavirus A NOT DETECTED NOT DETECTED Final  Sapovirus (I, II, IV, and V) NOT DETECTED NOT DETECTED Final    Comment: Performed at [facility], [address], [place], [place] [address]         Radiology Studies: No results found.      Scheduled Meds:  artificial tears  1 Application Both Eyes TID   aspirin  81 mg Per Tube Daily   Chlorhexidine Gluconate Cloth  6 each Topical Daily   feeding supplement (PROSource TF20)  60 mL Per Tube Daily   free water  150 mL Per Tube Q4H   hydrALAZINE  25 mg Per Tube Q8H   insulin aspart  0-5 Units Subcutaneous QHS   insulin aspart  0-9 Units Subcutaneous TID WC   leptospermum manuka honey  1 Application Topical Daily   loratadine  10 mg Per Tube Daily   pantoprazole  40 mg Oral BID   pyridostigmine  30 mg Per Tube Q8H   rosuvastatin  5 mg Per Tube QHS   sertraline  25 mg Per Tube Daily   Continuous Infusions:  feeding supplement (JEVITY 1.5 CAL/FIBER) 50 mL/hr at 10/25/22 0955   piperacillin-tazobactam (ZOSYN)  IV 3.375 g (10/25/22 0954)     LOS: 5 days    Time spent: 35 minutes    Dorcas Carrow, MD [facility]
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
## 2022-10-26 DIAGNOSIS — D649 Anemia, unspecified: Secondary | ICD-10-CM | POA: Diagnosis not present

## 2022-10-26 LAB — BASIC METABOLIC PANEL
Anion gap: 8 (ref 5–15)
BUN: 19 mg/dL (ref 8–23)
CO2: 24 mmol/L (ref 22–32)
Calcium: 7.9 mg/dL — ABNORMAL LOW (ref 8.9–10.3)
Chloride: 108 mmol/L (ref 98–111)
Creatinine, Ser: 1.04 mg/dL (ref 0.61–1.24)
GFR, Estimated: >60 mL/min (ref >60)
Glucose, Bld: 205 mg/dL — ABNORMAL HIGH (ref 70–99)
Potassium: 3.5 mmol/L (ref 3.5–5.1)
Sodium: 140 mmol/L (ref 135–145)

## 2022-10-26 LAB — GLUCOSE, CAPILLARY
Glucose-Capillary: 116 mg/dL — ABNORMAL HIGH (ref 70–99)
Glucose-Capillary: 136 mg/dL — ABNORMAL HIGH (ref 70–99)
Glucose-Capillary: 148 mg/dL — ABNORMAL HIGH (ref 70–99)
Glucose-Capillary: 173 mg/dL — ABNORMAL HIGH (ref 70–99)
Glucose-Capillary: 205 mg/dL — ABNORMAL HIGH (ref 70–99)
Glucose-Capillary: 213 mg/dL — ABNORMAL HIGH (ref 70–99)

## 2022-10-26 LAB — CBC
HCT: 25 % — ABNORMAL LOW (ref 39.0–52.0)
Hemoglobin: 7.9 g/dL — ABNORMAL LOW (ref 13.0–17.0)
MCH: 28.2 pg (ref 26.0–34.0)
MCHC: 31.6 g/dL (ref 30.0–36.0)
MCV: 89.3 fL (ref 80.0–100.0)
Platelets: 149 10*3/uL — ABNORMAL LOW (ref 150–400)
RBC: 2.8 MIL/uL — ABNORMAL LOW (ref 4.22–5.81)
RDW: 17.9 % — ABNORMAL HIGH (ref 11.5–15.5)
WBC: 6.5 10*3/uL (ref 4.0–10.5)
nRBC: 0 % (ref 0.0–0.2)

## 2022-10-26 LAB — PROCALCITONIN: Procalcitonin: 0.23 ng/mL

## 2022-10-26 MED ORDER — ASPIRIN 81 MG PO CHEW: 81.0000 mg | CHEWABLE_TABLET | Freq: Every day | ORAL | Status: DC

## 2022-10-26 MED ORDER — JEVITY 1.5 CAL/FIBER PO LIQD
1000.0000 mL | ORAL | Status: DC
  Administered 2022-10-26 – 2022-10-27 (×3): 1000 mL
  Filled 2022-10-26 (×4): qty 1000

## 2022-10-26 MED ORDER — CARVEDILOL 6.25 MG PO TABS
6.2500 mg | ORAL_TABLET | Freq: Two times a day (BID) | ORAL | Status: DC
  Administered 2022-10-26 – 2022-10-30 (×8): 6.25 mg
  Filled 2022-10-26 (×8): qty 1

## 2022-10-26 MED ORDER — AMOXICILLIN-POT CLAVULANATE 250-62.5 MG/5ML PO SUSR: 500.0000 mg | Freq: Two times a day (BID) | ORAL | Status: DC

## 2022-10-26 MED ORDER — ESOMEPRAZOLE MAGNESIUM 40 MG PO CPDR: 40.0000 mg | DELAYED_RELEASE_CAPSULE | Freq: Every day | ORAL | Status: DC

## 2022-10-26 MED ORDER — AMLODIPINE BESYLATE 5 MG PO TABS
5.0000 mg | ORAL_TABLET | Freq: Every day | ORAL | Status: DC
  Administered 2022-10-26 – 2022-10-30 (×5): 5 mg
  Filled 2022-10-26 (×5): qty 1

## 2022-10-26 MED ORDER — FAMOTIDINE 20 MG PO TABS
20.0000 mg | ORAL_TABLET | Freq: Every day | ORAL | Status: AC
Start: 2022-10-26 — End: 2023-10-26

## 2022-10-26 MED ORDER — OSMOLITE 1.5 CAL PO LIQD: 1000.0000 mL | ORAL | Status: DC

## 2022-10-26 NOTE — Care Plan (Signed)
I had detailed conversation with patient's daughter Ms. [name] over the phone about patient's clinical condition, readiness to discharge back to skilled nursing facility as do not expect any more clinical improvement in the hospital.  Patient's daughter wanted to appeal the discharge process.  She is wanting to recheck his hemoglobin tomorrow morning and agreeable to discharge tomorrow morning if that is stable.  I directed her to case management for appeal process.  Also explained to her that patient is in such a poor clinical status that he can decompensate anytime.  Ms. [name] was very appreciative of the call.

## 2022-10-26 NOTE — TOC Transition Note (Addendum)
Transition of Care [facility]) - CM/SW Discharge Note   Patient Details  Name: Harry [name] MRN: [id number] Date of Birth: 1945/07/01  [facility]) CM/SW Contact:  Lanier Clam, RN Phone Number: 10/26/2022, 9:10 AM   Clinical Narrative:  For d/c today return to [facility] LTC rep Lawerance Cruel aware-PEG/Osmolite. -11a noted d/c order-MD has spoken to dtr [name]-she may appeal d/c-will await appeal process to proceed. -11:58a spke to [name](dtr) her concerns were about the medicare IM notice in the rm-patient has dementia-[name] not in hospital NCM asked if she had an email,fax# to send IM notice-she said she will find the number for medicare to appeal d/c-she had concerns about the time frame to call-I advised to call as soon as she can since a small window to inititate the appeal not the next day-[name] voiced understanding. -2p-Appeal process initiated-NCM/secy submitted all info to [facility] determination.     Final next level of care: Long Term Nursing Home Barriers to Discharge: No Barriers Identified   Patient Goals and CMS Choice CMS Medicare.gov Compare Post Acute Care list provided to:: Patient Represenative (must comment) ([name](dtr)) Choice offered to / list presented to : Adult Children  Discharge Placement                         Discharge Plan and Services Additional resources added to the After Visit Summary for     Discharge Planning Services: CM Consult Post Acute Care Choice: Nursing Home                               Social Determinants of Health (SDOH) Interventions SDOH Screenings   Food Insecurity: No Food Insecurity (10/20/2022)  Housing: Patient Unable To Answer (10/20/2022)  Transportation Needs: No Transportation Needs (10/20/2022)  Utilities: Not At Risk (10/20/2022)  Depression (PHQ2-9): Low Risk  (03/01/2020)  Financial Resource Strain: Low Risk  (09/29/2018)  Physical Activity: Inactive (09/29/2018)  Tobacco Use: Medium Risk  (10/20/2022)     Readmission Risk Interventions    10/22/2022   11:00 AM 10/21/2022    9:19 AM 10/08/2022    1:37 PM  Readmission Risk Prevention Plan  Transportation Screening Complete Complete Complete  PCP or Specialist Appt within 3-5 Days  Complete   HRI or Home Care Consult  Complete   Social Work Consult for Recovery Care Planning/Counseling  Complete   Palliative Care Screening  Not Complete   Medication Review Oceanographer) Complete Not Complete Complete  PCP or Specialist appointment within 3-5 days of discharge Complete  Complete  HRI or Home Care Consult Complete    SW Recovery Care/Counseling Consult Complete    Palliative Care Screening Complete    Skilled Nursing Facility Not Applicable

## 2022-10-26 NOTE — Care Management Important Message (Signed)
Important Message  Patient Details IM Letter given. Name: DOMENICO MCCLARY MRN: [id number] Date of Birth: 1945/12/29   Medicare Important Message Given:  Yes     Caren Macadam 10/26/2022, 10:00 AM

## 2022-10-26 NOTE — Discharge Summary (Signed)
Physician Discharge Summary  Harry [name] ZOX:[id number] DOB: 26-Aug-1945 DOA: 10/20/2022  PCP: Crist Fat, MD  Admit date: 10/20/2022 Discharge date: 10/26/2022  Admitted From: Long-term care nursing home Disposition: Long-term care nursing home  Recommendations for Outpatient Follow-up:  Follow up with PCP at the skilled nursing facility Please obtain BMP/CBC in one week  Discharge Condition: Fair CODE STATUS: Full code Diet recommendation: N.p.o., tube feeding  Discharge summary: Discharging back to nursing home, patient is in very poor clinical status with poor goal of care.  Very high risk of readmission within days to months.  77 year old male with history of Lewy body dementia,history of multiple strokes, CKD 3B, dysphagia with PEG tube in place, anasarca ,chronic anemia ,hyperlipidemia, diabetes ,hypertension brought to the ED from [facility] with moaning and groaning and with increased agitation,recently diagnosed with pneumonia. Recently discharged on 8/13 where he was treated for aspiration pneumonia. On last admission GI had seen for anemia. Had 1 unit prbc on 10/02/22.  On admission hemodynamically stable.  On room air.  Positive FOBT.  Drowsy.  Respiratory panel negative.  Chest x-ray with improved opacification and small pleural effusion, however repeat chest x-ray with more extensive consolidations.  Hemoglobin was 6.8.  Blood transfusion started, started on IV antibiotics and admitted to the hospital.   Remains in very poor clinical status.  Recurrent admissions.  Not tolerating Plavix.  Family has unrealistic expectations.  Fairly stabilized today.  Stable to transfer to a skilled level of care.  Treated for following conditions.     Assessment & Plan:   Anemia, acute on chronic GI blood loss. Tube feeding  issues- high residual and aspiration. Unable to protect secretions on and off. Total 2 units on this admit.  Hemoglobin is fairly stable since  replacement. Hemoglobin 6.8-2 units of transfusion-7.9-8-8.3-7.9. 4 unit of blood transfusion in 1 month. Hemodynamically stable. Plavix on hold.  GI has been consulted as per daughter's request, she desires full scope of treatment. Seen by gastroenterology.  Does not recommend endoscopic evaluation due to high risk of getting intubated and aspiration along with adverse events from the anesthesia. Conservative management advised.   Will continue PPI.  On discharge, will prescribe Nexium along with Pepcid.  Nexium can be given through the PEG tube.  Patient is on IV Protonix while in the hospital. Discontinue Plavix. See discussion below for antiplatelet therapy.  Will start patient on aspirin.   Acute hypoxic respiratory failure Recurrent aspiration pneumonia, worsening aspiration. Patient hypoxic at the facility, needing 2 L nasal cannula.  BNP stable no evidence of fluid overload, D-dimer obtained and was elevated subsequently underwent CT angio chest showing no PE, extensive endoluminal debris's with near complete impaction of the left bronchial tree and scattered airway impaction within the right lower lobe, bibasilar pulmonary infiltrates-findings suggest changes of aspiration. PTA patient was on Zyvox through 8/19.  Currently on Zosyn. Chest x-ray 8/19, worsening infiltrates. Chest physiotherapy as much patient is able to do it.   Keep NPO.  Not ready for swallowing trials. Will change to course of Augmentin on discharge.  Not sure any antibiotics is going to cure his aspiration. Remains high risk of aspiration on his own secretions.   Small pleural effusion Minimal ascites on last admission 8/5: CT shows a small bilateral pleural effusion improved from last imaging-likely multifactorial, nutritional given hypoalbuminemia, BNP stable 211.Echo from July of 11/2022 showed EF 60 to 65%, G1 DD.  Augment nutritional status once able.    History of  stroke on Plavix: Unable to tolerate Plavix.   Comes back with GI bleeding.  Unable to have procedure.   At this point not sure any antiplatelet therapy will change his outcome.   Since patient is not tolerating Plavix, we had detailed conversation with patient's daughter.  Will challenge with aspirin 81 mg daily.   He has history of stroke and latest stroke 5 months ago. Discussed with family.  Will discontinue Plavix and start aspirin as alternative suboptimal treatment.  He is high risk of getting another stroke.   Dysphagia s/p PEG in place Recently discharged to [facility] 7/15 and PEG was placed there: Possible infiltration of feeding formula into abdominal wall. Tolerates tube feeds but gets more secretions in his throat. Aspiration precautions.  Elevate head of the bed. Currently tolerating tube feeding 50 mL/h since last 24 hours.  Will discharge on 50 mL/h.  Unable to tolerate any oral intake as above.   Hypertension: Blood pressure is stable . Resume Coreg, amlodipine and continue hydralazine.   Acute metabolic encephalopathy Vascular dementia and also with Lewy body dementia: Patient is very poor clinical status.  Recurrent hospitalizations.  Recurrent stroke.  Bedbound.  Unable to eat.  Keeps getting strokes.  Mostly remains lethargic and minimally responsive.   Suspect recurrent aspiration, hypoglycemia contributed to his lethargy.  Recent head CT/ MRI of the brain on last admission no acute finding. EEG on last admission with mild diffuse encephalopathy nonspecific etiology.  Does not tolerate antiplatelet therapy, trying aspirin alone.   Type 2 diabetes mellitus W/ hypoglycemia: Stable on tube feeding.  If he is on goal tube feeding of 50 mL/h, start patient on long-acting insulin and sliding scale coverage.  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  Continue the same.  Exchanged.   CKD stage IIIa: BUN/creatinine stable and at about baseline.   Diarrheal illness with enterotoxigenic E. coli: Patient had  significant diarrhea.  On IV Zosyn while in the hospital.  Symptomatic management for diarrhea.  Contact precautions.  Some improvement today.  Diarrhea improving.  Patient will be prescribed Augmentin on discharge that should cover for both aspiration pneumonia and E. coli diarrhea.  Multiple pressure ulcers present on admission.  See below on flow sheet. Barrier dressing and local care.   In poor clinical status, however currently stable.  Able to discharge to nursing home.     Discharge Diagnoses:  Principal Problem:   Anemia Active Problems:   Need for emotional support   S/P percutaneous endoscopic gastrostomy (PEG) tube placement (HCC)   Aspiration pneumonia (HCC)   Counseling and coordination of care   Anemia requiring transfusions   Palliative care encounter    Discharge Instructions  Discharge Instructions     Discharge wound care:   Complete by: As directed    Wound care  Daily      Comments: Silicone foam dressings to the heels, monitor right heel for acute changes in DTPI (medially), change every 3 days. ASSESS UNDER dressings each shift for any acute changes in the wounds. Wound care  Daily      Comments: Apply single layer of xeroform gauze to the wounds at the PEG site, change daily. Wound care  Daily      Comments: Cleanse sacral wound with saline, pat dry Apply 1/4" thick layer of leptospermum honey to sacral wound bed, top with dry dressing, change daily.   Increase activity slowly   Complete by: As directed       Allergies  as of 10/26/2022       Reactions   Lipitor [atorvastatin] Other (See Comments)   Myalgias   Statins Other (See Comments)   Myalgias  Pt tolerating rosuvastatin 5mg  QD   Pravachol [pravastatin] Rash        Medication List     STOP taking these medications    clopidogrel 75 MG tablet Commonly known as: PLAVIX   linezolid 600 MG tablet Commonly known as: ZYVOX   magic mouthwash Soln       TAKE these medications     amLODipine 10 MG tablet Commonly known as: NORVASC Place 1 tablet (10 mg total) into feeding tube daily.   amoxicillin-clavulanate 250-62.5 MG/5ML suspension Commonly known as: AUGMENTIN Take 10 mLs (500 mg total) by mouth 2 (two) times daily for 5 days.   aspirin 81 MG chewable tablet Place 1 tablet (81 mg total) into feeding tube daily.   carvedilol 12.5 MG tablet Commonly known as: COREG Take 1 tablet (12.5 mg total) by mouth 2 (two) times daily with a meal. What changed: how to take this   CREAM BASE EX Apply 1 Application topically daily. Apply minerin cream to feet   esomeprazole 40 MG capsule Commonly known as: NexIUM Take 1 capsule (40 mg total) by mouth daily. Use through PEG tube   famotidine 20 MG tablet Commonly known as: PEPCID Place 1 tablet (20 mg total) into feeding tube at bedtime.   feeding supplement (PROSource TF) liquid Place 45 mLs into feeding tube 2 (two) times daily. What changed: Another medication with the same name was changed. Make sure you understand how and when to take each.   feeding supplement (OSMOLITE 1.5 CAL) Liqd Place 1,000 mLs into feeding tube continuous. 50 ml/hr What changed:  how much to take additional instructions   ferrous gluconate 324 MG tablet Commonly known as: FERGON 324 mg at bedtime. Per tube   free water Soln Place 200 mLs into feeding tube every 4 (four) hours.   GenTeal Tears Severe Day/Night 0.4-0.3 % Gel ophthalmic gel Generic drug: Polyethyl Glycol-Propyl Glycol Place 1 Application into both eyes 3 (three) times daily.   hydrALAZINE 25 MG tablet Commonly known as: APRESOLINE Place 1 tablet (25 mg total) into feeding tube every 8 (eight) hours.   insulin lispro 100 UNIT/ML injection Commonly known as: HUMALOG Inject 0-12 Units into the skin See admin instructions. Inject 0-12 units three times daily before meals per sliding scale: BG < 70, call NP/PA. BG 70 - 200 : 0 units BG 201-250 : 2 units BG  251-300 : 4 units BG 301-350 : 6 units BG 351-400 : 8 units BG 401-450 : 10 units BG 451-500 : 12 units BG > 500 : 12 units, recheck in 2 hours. If still > 350 or < 100 notify provider BG is 451 to 600 give 12 units.   Lantus SoloStar 100 UNIT/ML Solostar Pen Generic drug: insulin glargine Inject 13 Units into the skin daily.   leptospermum manuka honey Pste paste Apply 1 Application topically daily.   multivitamin Liqd Place 15 mLs into feeding tube daily.   nystatin 100000 UNIT/ML suspension Commonly known as: MYCOSTATIN Take 15 mLs by mouth in the morning, at noon, and at bedtime. Swish and spit   pyridostigmine 60 MG tablet Commonly known as: MESTINON Take 0.5 tablets (30 mg total) by mouth every 8 (eight) hours. What changed: how to take this   rosuvastatin 5 MG tablet Commonly known as: CRESTOR Place 5 mg  into feeding tube at bedtime.   sertraline 25 MG tablet Commonly known as: ZOLOFT Place 25 mg into feeding tube daily.   tamsulosin 0.4 MG Caps capsule Commonly known as: FLOMAX Take 1 capsule (0.4 mg total) by mouth daily. What changed:  how to take this additional instructions               Discharge Care Instructions  (From admission, onward)           Start     Ordered   10/26/22 0000  Discharge wound care:       Comments: Wound care  Daily      Comments: Silicone foam dressings to the heels, monitor right heel for acute changes in DTPI (medially), change every 3 days. ASSESS UNDER dressings each shift for any acute changes in the wounds. Wound care  Daily      Comments: Apply single layer of xeroform gauze to the wounds at the PEG site, change daily. Wound care  Daily      Comments: Cleanse sacral wound with saline, pat dry Apply 1/4" thick layer of leptospermum honey to sacral wound bed, top with dry dressing, change daily.   10/26/22 0844            Contact information for after-discharge care     Destination     [facility]erred [facility] .   Service: Skilled Nursing Contact information: [address] [place] [address] [phone]                    Allergies  Allergen Reactions   Lipitor [Atorvastatin] Other (See Comments)    Myalgias   Statins Other (See Comments)    Myalgias  Pt tolerating rosuvastatin 5mg  QD   Pravachol [Pravastatin] Rash    Consultations: Gastroenterology   Procedures/Studies: DG CHEST PORT 1 VIEW  Result Date: 10/22/2022 CLINICAL DATA:  Shortness of breath EXAM: PORTABLE CHEST 1 VIEW COMPARISON:  10/20/2022 FINDINGS: Consolidation throughout the left lung and in the right lower lobe concerning for pneumonia. Heart and mediastinal contours are within normal limits. Small bilateral pleural effusions. No acute bony abnormality. IMPRESSION: Worsening bilateral airspace disease, diffuse on the left and in the right lower lobe concerning for multifocal pneumonia. Small layering bilateral effusions. Electronically Signed   By: Charlett Nose M.D.   On: 10/22/2022 22:17   CT Angio Chest Pulmonary Embolism (PE) W or WO Contrast  Result Date: 10/21/2022 CLINICAL DATA:  Pulmonary embolism (PE), high prob; Abdominal pain, acute, nonlocalized EXAM: CT ANGIOGRAPHY CHEST CT ABDOMEN AND PELVIS WITH CONTRAST TECHNIQUE: Multidetector CT imaging of the chest was performed using the standard protocol during bolus administration of intravenous contrast. Multiplanar CT image reconstructions and MIPs were obtained to evaluate the vascular anatomy. Multidetector CT imaging of the abdomen and pelvis was performed using the standard protocol during bolus administration of intravenous contrast. RADIATION DOSE REDUCTION: This exam was performed according to the departmental dose-optimization program which includes automated exposure control, adjustment of the mA and/or kV according to patient size and/or use of iterative reconstruction technique. CONTRAST:   OMNIPAQUE IOHEXOL 350 MG/ML SOLN COMPARISON:  CT chest 09/06/2022, CT abdomen pelvis 10/08/2022. FINDINGS: CTA CHEST FINDINGS Cardiovascular: There is adequate opacification of the pulmonary arterial tree. No intraluminal filling defect identified to suggest acute pulmonary embolism. Central pulmonary arteries are of normal caliber. Moderate coronary artery calcification. Global cardiac size within normal limits. No pericardial effusion. Mild atherosclerotic calcification within  the thoracic aorta. No aortic aneurysm. Mediastinum/Nodes: Visualized thyroid is unremarkable. Stable shotty right paratracheal and bilateral hilar adenopathy, nonspecific, possibly reactive in nature. The esophagus is unremarkable. Lungs/Pleura: There is extensive and luminal debris within the tracheobronchial tree with near complete impaction of the left bronchial tree and scattered airway impaction within the right lower lobe. Bibasilar pulmonary infiltrates are present. Together, the findings suggest changes of aspiration. Small bilateral pleural effusions are present, new since prior examination. No pneumothorax. Musculoskeletal: No acute bone abnormality. No lytic or blastic bone lesion. Review of the MIP images confirms the above findings. CT ABDOMEN and PELVIS FINDINGS Hepatobiliary: Cholelithiasis without pericholecystic inflammatory change. Tiny cyst within the right hepatic dome. Liver otherwise unremarkable. No intra or extrahepatic biliary ductal dilation. Pancreas: Unremarkable. No pancreatic ductal dilatation or surrounding inflammatory changes. Spleen: Normal in size without focal abnormality. Adrenals/Urinary Tract: The adrenal glands are unremarkable. The kidneys are normal in size and position. Simple cortical cyst noted within the lower pole right kidney for which no follow-up imaging is recommended. The kidneys are otherwise unremarkable. Foley catheter balloon is seen within a largely decompressed bladder lumen.  There is circumferential marked bladder wall thickening suggesting changes of chronic bladder outlet obstruction, similar to prior examination. Stomach/Bowel: Previously noted button type PEG tube has been exchanged for a balloon retention gastrostomy catheter which is in expected position within the gastric lumen. Infiltration along the tract of the gastrostomy catheter appears improved since prior examination related to leakage or inflammation along the tract. Moderate fluid is again seen within the subcutaneous soft tissues of the left lateral abdominal wall as well as within the fascial planes of the lateral abdominal wall musculature, increased since prior examination. Stomach, small bowel, and large bowel are otherwise unremarkable. Appendix normal. No free intraperitoneal gas or fluid. Vascular/Lymphatic: Extensive atherosclerotic calcification within the aortoiliac vasculature. No aortic aneurysm. No pathologic adenopathy within the abdomen and pelvis. Reproductive: Marked prostatic hypertrophy. Other: Mild diffuse subcutaneous edema again noted within the gluteal soft tissues and visualized lower extremities bilaterally. Musculoskeletal: No acute bone abnormality. No lytic or blastic bone lesion. Review of the MIP images confirms the above findings. IMPRESSION: 1. No pulmonary embolism. 2. Extensive endoluminal debris within the tracheobronchial tree with near complete impaction of the left bronchial tree and scattered airway impaction within the right lower lobe. Bibasilar pulmonary infiltrates. Together, the findings suggest changes of aspiration. 3. Small bilateral pleural effusions, new since prior examination but improved since CT examination of the abdomen pelvis of 10/08/2022. 4. Moderate coronary artery calcification. 5. Cholelithiasis. 6. Marked prostatic hypertrophy. Changes of chronic bladder outlet obstruction with Foley decompression of the bladder. 7. Interval gastrostomy exchange. Mild  subcutaneous infiltration along the gastrostomy catheter tract and moderate fluid within the subcutaneous soft tissues of the left lateral abdominal wall as well as within the fascial planes of the lateral abdominal wall musculature may reflect changes of leakage along an immature tract and appears increased since prior examination. Aortic Atherosclerosis (ICD10-I70.0). Electronically Signed   By: Helyn Numbers M.D.   On: 10/21/2022 02:56   CT ABDOMEN PELVIS W CONTRAST  Result Date: 10/21/2022 CLINICAL DATA:  Pulmonary embolism (PE), high prob; Abdominal pain, acute, nonlocalized EXAM: CT ANGIOGRAPHY CHEST CT ABDOMEN AND PELVIS WITH CONTRAST TECHNIQUE: Multidetector CT imaging of the chest was performed using the standard protocol during bolus administration of intravenous contrast. Multiplanar CT image reconstructions and MIPs were obtained to evaluate the vascular anatomy. Multidetector CT imaging of the abdomen and pelvis was performed  using the standard protocol during bolus administration of intravenous contrast. RADIATION DOSE REDUCTION: This exam was performed according to the departmental dose-optimization program which includes automated exposure control, adjustment of the mA and/or kV according to patient size and/or use of iterative reconstruction technique. CONTRAST:  OMNIPAQUE IOHEXOL 350 MG/ML SOLN COMPARISON:  CT chest 09/06/2022, CT abdomen pelvis 10/08/2022. FINDINGS: CTA CHEST FINDINGS Cardiovascular: There is adequate opacification of the pulmonary arterial tree. No intraluminal filling defect identified to suggest acute pulmonary embolism. Central pulmonary arteries are of normal caliber. Moderate coronary artery calcification. Global cardiac size within normal limits. No pericardial effusion. Mild atherosclerotic calcification within the thoracic aorta. No aortic aneurysm. Mediastinum/Nodes: Visualized thyroid is unremarkable. Stable shotty right paratracheal and bilateral hilar  adenopathy, nonspecific, possibly reactive in nature. The esophagus is unremarkable. Lungs/Pleura: There is extensive and luminal debris within the tracheobronchial tree with near complete impaction of the left bronchial tree and scattered airway impaction within the right lower lobe. Bibasilar pulmonary infiltrates are present. Together, the findings suggest changes of aspiration. Small bilateral pleural effusions are present, new since prior examination. No pneumothorax. Musculoskeletal: No acute bone abnormality. No lytic or blastic bone lesion. Review of the MIP images confirms the above findings. CT ABDOMEN and PELVIS FINDINGS Hepatobiliary: Cholelithiasis without pericholecystic inflammatory change. Tiny cyst within the right hepatic dome. Liver otherwise unremarkable. No intra or extrahepatic biliary ductal dilation. Pancreas: Unremarkable. No pancreatic ductal dilatation or surrounding inflammatory changes. Spleen: Normal in size without focal abnormality. Adrenals/Urinary Tract: The adrenal glands are unremarkable. The kidneys are normal in size and position. Simple cortical cyst noted within the lower pole right kidney for which no follow-up imaging is recommended. The kidneys are otherwise unremarkable. Foley catheter balloon is seen within a largely decompressed bladder lumen. There is circumferential marked bladder wall thickening suggesting changes of chronic bladder outlet obstruction, similar to prior examination. Stomach/Bowel: Previously noted button type PEG tube has been exchanged for a balloon retention gastrostomy catheter which is in expected position within the gastric lumen. Infiltration along the tract of the gastrostomy catheter appears improved since prior examination related to leakage or inflammation along the tract. Moderate fluid is again seen within the subcutaneous soft tissues of the left lateral abdominal wall as well as within the fascial planes of the lateral abdominal wall  musculature, increased since prior examination. Stomach, small bowel, and large bowel are otherwise unremarkable. Appendix normal. No free intraperitoneal gas or fluid. Vascular/Lymphatic: Extensive atherosclerotic calcification within the aortoiliac vasculature. No aortic aneurysm. No pathologic adenopathy within the abdomen and pelvis. Reproductive: Marked prostatic hypertrophy. Other: Mild diffuse subcutaneous edema again noted within the gluteal soft tissues and visualized lower extremities bilaterally. Musculoskeletal: No acute bone abnormality. No lytic or blastic bone lesion. Review of the MIP images confirms the above findings. IMPRESSION: 1. No pulmonary embolism. 2. Extensive endoluminal debris within the tracheobronchial tree with near complete impaction of the left bronchial tree and scattered airway impaction within the right lower lobe. Bibasilar pulmonary infiltrates. Together, the findings suggest changes of aspiration. 3. Small bilateral pleural effusions, new since prior examination but improved since CT examination of the abdomen pelvis of 10/08/2022. 4. Moderate coronary artery calcification. 5. Cholelithiasis. 6. Marked prostatic hypertrophy. Changes of chronic bladder outlet obstruction with Foley decompression of the bladder. 7. Interval gastrostomy exchange. Mild subcutaneous infiltration along the gastrostomy catheter tract and moderate fluid within the subcutaneous soft tissues of the left lateral abdominal wall as well as within the fascial planes of the lateral abdominal  wall musculature may reflect changes of leakage along an immature tract and appears increased since prior examination. Aortic Atherosclerosis (ICD10-I70.0). Electronically Signed   By: Helyn Numbers M.D.   On: 10/21/2022 02:56   DG Chest 2 View  Result Date: 10/20/2022 CLINICAL DATA:  Cough.  Recently diagnosed with pneumonia. EXAM: CHEST - 2 VIEW COMPARISON:  10/02/2022, CT abdomen 10/08/2022 FINDINGS: Lungs are  adequately inflated demonstrate hazy left base/retrocardiac opacification likely due to small effusion with possible associated atelectasis. Hazy opacification over the right infrahilar region which may be due to asymmetric edema versus infection. Overall interval improved bibasilar opacification likely improved effusions. Cardiomediastinal silhouette and remainder of the exam is unchanged. IMPRESSION: 1. Improved hazy left base/retrocardiac opacification likely due to small effusion with possible associated atelectasis. 2. Improved hazy opacification over the right infrahilar region which may be due to asymmetric edema, atelectasis or infection. Electronically Signed   By: Elberta Fortis M.D.   On: 10/20/2022 11:41   EEG adult  Result Date: 10/15/2022 Charlsie Quest, MD     10/15/2022  4:45 PM Patient Name: AREL HENAGAN MRN: [id number] Epilepsy Attending: Charlsie Quest Referring Physician/Provider: Briant Cedar, MD Date: 10/15/2022 Duration: 24.50 mins Patient history: 77yo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 effusions with compressive bilateral lower lobe atelectasis. 3. Trace ascites. 4. Diffuse body wall edema. 5. Cholelithiasis without cholecystitis. 6. Stable enlarged prostate.  Nonspecific bladder wall thickening could be due to chronic bladder outlet obstruction or under distension. 7.  Aortic Atherosclerosis (ICD10-I70.0). Electronically Signed   By: Sharlet Salina M.D.   On: 10/03/2022 18:01   DG CHEST PORT 1 VIEW  Result Date: 10/02/2022 CLINICAL DATA:  Evaluate central line placement EXAM: PORTABLE CHEST 1 VIEW COMPARISON:  09/09/2022 FINDINGS: Interval placement of left IJ catheter with tip in the projection of the distal SVC. Heart size and mediastinal contours are stable. Increased bilateral mid and lower lung zone opacities which may reflect bilateral pleural effusions, atelectasis or airspace disease. No pneumothorax identified. IMPRESSION: 1. Interval placement of left IJ catheter with tip in the projection of the distal SVC. No pneumothorax identified. 2. Increased bilateral mid and lower lung zone opacities which may reflect bilateral pleural effusions, atelectasis and/or airspace disease. Electronically Signed   By: Signa Kell M.D.   On: 10/02/2022 05:15   (Echo, Carotid, EGD, Colonoscopy, ERCP)    Subjective: Patient seen and examined.  No overnight events.  Tolerating tube feeding.  Blood pressures picking up.  He tells me he is fine to every questions.   Discharge Exam: Vitals:   10/26/22 0108 10/26/22 0400  BP:  (!) 178/76  Pulse:  (!) 103  Resp:  20  Temp: 98.6 F (37 C) 98.3 F (36.8 C)  SpO2:  95%   Vitals:   10/25/22 2003 10/26/22 0108 10/26/22 0400 10/26/22 0500  BP: (!) 172/74  (!) 178/76   Pulse: (!) 104  (!) 103   Resp: 20  20   Temp: 99.3 F (37.4 C) 98.6 F (37 C) 98.3 F (36.8 C)   TempSrc: Oral Axillary Oral   SpO2: 93%  95%   Weight:    93 kg  Height:        General: Very frail and debilitated.  Mostly sleepy.  Alert on stimulation.  Not oriented.  Answers few basic questions but mostly saying he is fine.  He has gross generalized weakness.  Left side is more weaker than the right side. Cardiovascular: S1-S2 normal.   Regular rate rhythm. Respiratory: Mostly conducted upper airway sounds.SpO2: 95 % O2 Flow Rate (L/min): 1.5 L/min  Gastrointestinal: Soft.  Mild tenderness along the PEG tube insertion site.  He has occasional secretions around the PEG tube insertion site. Ext: Keeps lower extremities in contracted position.  Painful to extend knees. Catheters: Foley catheter with clear urine.     The results of significant diagnostics from this hospitalization (including imaging, microbiology, ancillary and laboratory) are listed below for reference.     Microbiology: Recent Results (from the past 240 hour(s))  Resp panel by RT-PCR (RSV, Flu A&B, Covid) Anterior Nasal Swab     Status: None   Collection Time: 10/20/22 11:08 AM   Specimen: Anterior Nasal Swab  Result Value Ref Range Status   SARS Coronavirus 2 by RT PCR NEGATIVE NEGATIVE Final    Comment: (NOTE) SARS-CoV-2 target nucleic acids are NOT DETECTED.  The SARS-CoV-2 RNA is generally detectable in upper respiratory specimens during the acute phase of infection. The lowest concentration of SARS-CoV-2 viral copies this assay can detect is 138 copies/mL. A negative result does not preclude SARS-Cov-2 infection and should not be used as the sole basis for treatment or other patient management decisions. A negative result may occur with  improper specimen collection/handling, submission of specimen other than nasopharyngeal swab, presence of viral mutation(s) within the areas targeted by this assay, and inadequate number of viral copies(<138 copies/mL). A negative result must be combined with clinical observations, patient history, and epidemiological information. The expected result is Negative.  Fact Sheet for Patients:  BloggerCourse.com  Fact Sheet for Healthcare Providers:  SeriousBroker.it  This test is no t yet approved or cleared by the [place] FDA and  has been authorized  for detection and/or diagnosis of SARS-CoV-2 by FDA under an Emergency Use Authorization (EUA). This EUA will remain  in effect (meaning this test can be used) for the duration of the COVID-19 declaration under Section 564(b)(1) of the Act, 21 U.S.C.section 360bbb-3(b)(1), unless the authorization is terminated  or revoked sooner.       Influenza A by PCR NEGATIVE NEGATIVE Final   Influenza B by PCR NEGATIVE NEGATIVE Final    Comment: (NOTE) The Xpert Xpress SARS-CoV-2/FLU/RSV plus assay is intended as an aid in the diagnosis of influenza from Nasopharyngeal swab specimens and should not be used as a sole basis for treatment. Nasal washings and aspirates are unacceptable for Xpert Xpress SARS-CoV-2/FLU/RSV testing.  Fact Sheet for Patients: BloggerCourse.com  Fact Sheet for Healthcare Providers: SeriousBroker.it  This test is not yet approved or cleared by the [place] FDA and has been authorized for detection and/or diagnosis of SARS-CoV-2 by FDA under an Emergency Use Authorization (EUA). This EUA will remain in effect (meaning this test can be used) for the duration of the COVID-19 declaration under Section 564(b)(1) of the Act,  21 U.S.C. section 360bbb-3(b)(1), unless the authorization is terminated or revoked.     Resp Syncytial Virus by PCR NEGATIVE NEGATIVE Final    Comment: (NOTE) Fact Sheet for Patients: BloggerCourse.com  Fact Sheet for Healthcare Providers: SeriousBroker.it  This test is not yet approved or cleared by the [place] FDA and has been authorized for detection and/or diagnosis of SARS-CoV-2 by FDA under an Emergency Use Authorization (EUA). This EUA will remain in effect (meaning this test can be used) for the duration of the COVID-19 declaration under Section 564(b)(1) of the Act, 21 U.S.C. section 360bbb-3(b)(1), unless the authorization is  terminated or revoked.  Performed at [facility], [address] [place], [place] [address]   C Difficile Quick Screen w PCR reflex     Status: None   Collection Time: 10/21/22  5:45 PM   Specimen: Stool  Result Value Ref Range Status   C Diff antigen NEGATIVE NEGATIVE Final   C Diff toxin NEGATIVE NEGATIVE Final   C Diff interpretation No C. difficile detected.  Final    Comment: Performed at [facility], [address], [place], [place] [address]  MRSA Next Gen by PCR, Nasal     Status: None   Collection Time: 10/21/22  5:58 PM   Specimen: Nasal Mucosa; Nasal Swab  Result Value Ref Range Status   MRSA by PCR Next Gen NOT DETECTED NOT DETECTED Final    Comment: (NOTE) The GeneXpert MRSA Assay (FDA approved for NASAL specimens only), is one component of a comprehensive MRSA colonization surveillance program. It is not intended to diagnose MRSA infection nor to guide or monitor treatment for MRSA infections. Test performance is not FDA approved in patients less than 22 years old. Performed at [facility], [address], [place], [place] [address]   Gastrointestinal Panel by PCR , Stool     Status: Abnormal   Collection Time: 10/21/22  5:58 PM   Specimen: Stool  Result Value Ref Range Status   Campylobacter species NOT DETECTED NOT DETECTED Final   Plesimonas shigelloides NOT DETECTED NOT DETECTED Final   Salmonella species NOT DETECTED NOT DETECTED Final   Yersinia enterocolitica NOT DETECTED NOT DETECTED Final   Vibrio species NOT DETECTED NOT DETECTED Final   Vibrio cholerae NOT DETECTED NOT DETECTED Final   Enteroaggregative E coli (EAEC) NOT DETECTED NOT DETECTED Final   Enteropathogenic E coli (EPEC) NOT DETECTED NOT DETECTED Final   Enterotoxigenic E coli (ETEC) DETECTED (A) NOT DETECTED Final    Comment: RESULT CALLED TO, READ BACK BY AND VERIFIED WITH: JULIANNE TADDEO RN @2200  10/22/22 ASW    Shiga like  toxin producing E coli (STEC) NOT DETECTED NOT DETECTED Final   Shigella/Enteroinvasive E coli (EIEC) NOT DETECTED NOT DETECTED Final   Cryptosporidium NOT DETECTED NOT DETECTED Final   Cyclospora cayetanensis NOT DETECTED NOT DETECTED Final   Entamoeba histolytica NOT DETECTED NOT DETECTED Final   Giardia lamblia NOT DETECTED NOT DETECTED Final   Adenovirus F40/41 NOT DETECTED NOT DETECTED Final   Astrovirus NOT DETECTED NOT DETECTED Final   Norovirus GI/GII NOT DETECTED NOT DETECTED Final   Rotavirus A NOT DETECTED NOT DETECTED Final   Sapovirus (I, II, IV, and V) NOT DETECTED NOT DETECTED Final    Comment: Performed at [facility], [address] [place], [place] [address]     Labs: BNP (last 3 results) Recent Labs    06/01/22 2119 10/20/22 1717  BNP 157.1* 211.4*  Basic Metabolic Panel: Recent Labs  Lab 10/22/22 0454 10/23/22 0518 10/24/22 0455 10/25/22 0515 10/26/22 0447  NA 139 142 139 139 140  K 4.8 3.4* 3.3* 3.6 3.5  CL 107 110 107 109 108  CO2 23 22 21* 22 24  GLUCOSE 287* 121* 138* 200* 205*  BUN 34* 30* 21 17 19   CREATININE 1.19 1.19 1.09 1.09 1.04  CALCIUM 7.8* 8.1* 8.2* 8.1* 7.9*   Liver Function Tests: Recent Labs  Lab 10/20/22 1149  AST 26  ALT 31  ALKPHOS 71  BILITOT 0.6  PROT 6.5  ALBUMIN 2.6*   No results for input(s): "LIPASE", "AMYLASE" in the last 168 hours. No results for input(s): "AMMONIA" in the last 168 hours. CBC: Recent Labs  Lab 10/22/22 0454 10/23/22 0518 10/24/22 0455 10/25/22 0515 10/26/22 0447  WBC 9.3 7.5 7.0 6.4 6.5  HGB 7.1* 7.9* 8.0* 8.3* 7.9*  HCT 22.6* 25.0* 25.2* 26.2* 25.0*  MCV 88.3 88.3 88.4 87.3 89.3  PLT 123* 135* 151 153 149*   Cardiac Enzymes: No results for input(s): "CKTOTAL", "CKMB", "CKMBINDEX", "TROPONINI" in the last 168 hours. BNP: Invalid input(s): "POCBNP" CBG: Recent Labs  Lab 10/25/22 2202 10/26/22 0005 10/26/22 0357 10/26/22 0729 10/26/22 1123  GLUCAP 165* 148* 173*  213* 205*   D-Dimer No results for input(s): "DDIMER" in the last 72 hours. Hgb A1c No results for input(s): "HGBA1C" in the last 72 hours. Lipid Profile No results for input(s): "CHOL", "HDL", "LDLCALC", "TRIG", "CHOLHDL", "LDLDIRECT" in the last 72 hours. Thyroid function studies No results for input(s): "TSH", "T4TOTAL", "T3FREE", "THYROIDAB" in the last 72 hours.  Invalid input(s): "FREET3" Anemia work up No results for input(s): "VITAMINB12", "FOLATE", "FERRITIN", "TIBC", "IRON", "RETICCTPCT" in the last 72 hours. Urinalysis    Component Value Date/Time   COLORURINE YELLOW 10/05/2022 1342   APPEARANCEUR CLOUDY (A) 10/05/2022 1342   LABSPEC 1.018 10/05/2022 1342   PHURINE 5.0 10/05/2022 1342   GLUCOSEU NEGATIVE 10/05/2022 1342   HGBUR MODERATE (A) 10/05/2022 1342   BILIRUBINUR NEGATIVE 10/05/2022 1342   KETONESUR NEGATIVE 10/05/2022 1342   PROTEINUR 30 (A) 10/05/2022 1342   UROBILINOGEN 0.2 10/09/2014 2112   NITRITE NEGATIVE 10/05/2022 1342   LEUKOCYTESUR SMALL (A) 10/05/2022 1342   Sepsis Labs Recent Labs  Lab 10/23/22 0518 10/24/22 0455 10/25/22 0515 10/26/22 0447  WBC 7.5 7.0 6.4 6.5   Microbiology Recent Results (from the past 240 hour(s))  Resp panel by RT-PCR (RSV, Flu A&B, Covid) Anterior Nasal Swab     Status: None   Collection Time: 10/20/22 11:08 AM   Specimen: Anterior Nasal Swab  Result Value Ref Range Status   SARS Coronavirus 2 by RT PCR NEGATIVE NEGATIVE Final    Comment: (NOTE) SARS-CoV-2 target nucleic acids are NOT DETECTED.  The SARS-CoV-2 RNA is generally detectable in upper respiratory specimens during the acute phase of infection. The lowest concentration of SARS-CoV-2 viral copies this assay can detect is 138 copies/mL. A negative result does not preclude SARS-Cov-2 infection and should not be used as the sole basis for treatment or other patient management decisions. A negative result may occur with  improper specimen  collection/handling, submission of specimen other than nasopharyngeal swab, presence of viral mutation(s) within the areas targeted by this assay, and inadequate number of viral copies(<138 copies/mL). A negative result must be combined with clinical observations, patient history, and epidemiological information. The expected result is Negative.  Fact Sheet for Patients:  BloggerCourse.com  Fact Sheet for Healthcare Providers:  SeriousBroker.it  This test is no t yet approved or cleared by the [place] and  has been authorized for detection and/or diagnosis of SARS-CoV-2 by FDA under an Emergency Use Authorization (EUA). This EUA will remain  in effect (meaning this test can be used) for the duration of the COVID-19 declaration under Section 564(b)(1) of the Act, 21 U.S.C.section 360bbb-3(b)(1), unless the authorization is terminated  or revoked sooner.       Influenza A by PCR NEGATIVE NEGATIVE Final   Influenza B by PCR NEGATIVE NEGATIVE Final    Comment: (NOTE) The Xpert Xpress SARS-CoV-2/FLU/RSV plus assay is intended as an aid in the diagnosis of influenza from Nasopharyngeal swab specimens and should not be used as a sole basis for treatment. Nasal washings and aspirates are unacceptable for Xpert Xpress SARS-CoV-2/FLU/RSV testing.  Fact Sheet for Patients: BloggerCourse.com  Fact Sheet for Healthcare Providers: SeriousBroker.it  This test is not yet approved or cleared by the [place] FDA and has been authorized for detection and/or diagnosis of SARS-CoV-2 by FDA under an Emergency Use Authorization (EUA). This EUA will remain in effect (meaning this test can be used) for the duration of the COVID-19 declaration under Section 564(b)(1) of the Act, 21 U.S.C. section 360bbb-3(b)(1), unless the authorization is terminated or revoked.     Resp Syncytial  Virus by PCR NEGATIVE NEGATIVE Final    Comment: (NOTE) Fact Sheet for Patients: BloggerCourse.com  Fact Sheet for Healthcare Providers: SeriousBroker.it  This test is not yet approved or cleared by the [place] FDA and has been authorized for detection and/or diagnosis of SARS-CoV-2 by FDA under an Emergency Use Authorization (EUA). This EUA will remain in effect (meaning this test can be used) for the duration of the COVID-19 declaration under Section 564(b)(1) of the Act, 21 U.S.C. section 360bbb-3(b)(1), unless the authorization is terminated or revoked.  Performed at [facility], [address], [place], [place] [address]   C Difficile Quick Screen w PCR reflex     Status: None   Collection Time: 10/21/22  5:45 PM   Specimen: Stool  Result Value Ref Range Status   C Diff antigen NEGATIVE NEGATIVE Final   C Diff toxin NEGATIVE NEGATIVE Final   C Diff interpretation No C. difficile detected.  Final    Comment: Performed at [facility], [address] [place], [place] [address]  MRSA Next Gen by PCR, Nasal     Status: None   Collection Time: 10/21/22  5:58 PM   Specimen: Nasal Mucosa; Nasal Swab  Result Value Ref Range Status   MRSA by PCR Next Gen NOT DETECTED NOT DETECTED Final    Comment: (NOTE) The GeneXpert MRSA Assay (FDA approved for NASAL specimens only), is one component of a comprehensive MRSA colonization surveillance program. It is not intended to diagnose MRSA infection nor to guide or monitor treatment for MRSA infections. Test performance is not FDA approved in patients less than 53 years old. Performed at [facility], [address], [place], [place] [address]   Gastrointestinal Panel by PCR , Stool     Status: Abnormal   Collection Time: 10/21/22  5:58 PM   Specimen: Stool  Result Value Ref Range Status   Campylobacter species NOT DETECTED  NOT DETECTED Final   Plesimonas shigelloides NOT DETECTED NOT DETECTED Final   Salmonella species NOT DETECTED NOT DETECTED Final   Yersinia enterocolitica NOT DETECTED NOT DETECTED Final   Vibrio species NOT DETECTED NOT  DETECTED Final   Vibrio cholerae NOT DETECTED NOT DETECTED Final   Enteroaggregative E coli (EAEC) NOT DETECTED NOT DETECTED Final   Enteropathogenic E coli (EPEC) NOT DETECTED NOT DETECTED Final   Enterotoxigenic E coli (ETEC) DETECTED (A) NOT DETECTED Final    Comment: RESULT CALLED TO, READ BACK BY AND VERIFIED WITH: JULIANNE TADDEO RN @2200  10/22/22 ASW    Shiga like toxin producing E coli (STEC) NOT DETECTED NOT DETECTED Final   Shigella/Enteroinvasive E coli (EIEC) NOT DETECTED NOT DETECTED Final   Cryptosporidium NOT DETECTED NOT DETECTED Final   Cyclospora cayetanensis NOT DETECTED NOT DETECTED Final   Entamoeba histolytica NOT DETECTED NOT DETECTED Final   Giardia lamblia NOT DETECTED NOT DETECTED Final   Adenovirus F40/41 NOT DETECTED NOT DETECTED Final   Astrovirus NOT DETECTED NOT DETECTED Final   Norovirus GI/GII NOT DETECTED NOT DETECTED Final   Rotavirus A NOT DETECTED NOT DETECTED Final   Sapovirus (I, II, IV, and V) NOT DETECTED NOT DETECTED Final    Comment: Performed at [facility], [address], [place], [place] [address]     Time coordinating discharge:  35 minutes  SIGNED:   Dorcas Carrow, MD  [facility] 10/26/2022, 12:02 PM

## 2022-10-27 ENCOUNTER — Inpatient Hospital Stay (HOSPITAL_COMMUNITY): Payer: Medicare Other

## 2022-10-27 DIAGNOSIS — D649 Anemia, unspecified: Secondary | ICD-10-CM | POA: Diagnosis not present

## 2022-10-27 LAB — CBC WITH DIFFERENTIAL/PLATELET
Abs Immature Granulocytes: 0.09 10*3/uL — ABNORMAL HIGH (ref 0.00–0.07)
Basophils Absolute: 0 10*3/uL (ref 0.0–0.1)
Basophils Relative: 0 %
Eosinophils Absolute: 0.4 10*3/uL (ref 0.0–0.5)
Eosinophils Relative: 7 %
HCT: 24.2 % — ABNORMAL LOW (ref 39.0–52.0)
Hemoglobin: 7.5 g/dL — ABNORMAL LOW (ref 13.0–17.0)
Immature Granulocytes: 1 %
Lymphocytes Relative: 14 %
Lymphs Abs: 0.9 10*3/uL (ref 0.7–4.0)
MCH: 27.9 pg (ref 26.0–34.0)
MCHC: 31 g/dL (ref 30.0–36.0)
MCV: 90 fL (ref 80.0–100.0)
Monocytes Absolute: 1 10*3/uL (ref 0.1–1.0)
Monocytes Relative: 15 %
Neutro Abs: 4 10*3/uL (ref 1.7–7.7)
Neutrophils Relative %: 63 %
Platelets: 156 10*3/uL (ref 150–400)
RBC: 2.69 MIL/uL — ABNORMAL LOW (ref 4.22–5.81)
RDW: 18.1 % — ABNORMAL HIGH (ref 11.5–15.5)
WBC: 6.4 10*3/uL (ref 4.0–10.5)
nRBC: 0 % (ref 0.0–0.2)

## 2022-10-27 LAB — GLUCOSE, CAPILLARY
Glucose-Capillary: 232 mg/dL — ABNORMAL HIGH (ref 70–99)
Glucose-Capillary: 175 mg/dL — ABNORMAL HIGH (ref 70–99)
Glucose-Capillary: 166 mg/dL — ABNORMAL HIGH (ref 70–99)
Glucose-Capillary: 158 mg/dL — ABNORMAL HIGH (ref 70–99)

## 2022-10-27 LAB — PROCALCITONIN: Procalcitonin: 0.15 ng/mL

## 2022-10-27 MED ORDER — INSULIN GLARGINE-YFGN 100 UNIT/ML ~~LOC~~ SOLN
5.0000 [IU] | Freq: Every day | SUBCUTANEOUS | Status: DC
  Administered 2022-10-27 – 2022-10-30 (×4): 5 [IU] via SUBCUTANEOUS
  Filled 2022-10-27 (×4): qty 0.05

## 2022-10-27 MED ORDER — IPRATROPIUM-ALBUTEROL 0.5-2.5 (3) MG/3ML IN SOLN
3.0000 mL | RESPIRATORY_TRACT | Status: DC | PRN
  Administered 2022-10-28 – 2022-10-30 (×2): 3 mL via RESPIRATORY_TRACT
  Filled 2022-10-27 (×2): qty 3

## 2022-10-27 MED ORDER — FUROSEMIDE 10 MG/ML IJ SOLN
20.0000 mg | Freq: Once | INTRAMUSCULAR | Status: AC
  Administered 2022-10-27: 20 mg via INTRAVENOUS
  Filled 2022-10-27: qty 2

## 2022-10-27 MED ORDER — ALBUTEROL SULFATE (2.5 MG/3ML) 0.083% IN NEBU
2.5000 mg | INHALATION_SOLUTION | RESPIRATORY_TRACT | Status: DC | PRN
Start: 2022-10-27 — End: 2022-10-27

## 2022-10-27 NOTE — Plan of Care (Signed)
  Problem: Clinical Measurements: Goal: Will remain free from infection Outcome: Progressing Goal: Diagnostic test results will improve Outcome: Progressing Goal: Respiratory complications will improve Outcome: Progressing Goal: Cardiovascular complication will be avoided Outcome: Progressing   Problem: Nutrition: Goal: Adequate nutrition will be maintained Outcome: Progressing   Problem: Elimination: Goal: Will not experience complications related to bowel motility Outcome: Progressing Goal: Will not experience complications related to urinary retention Outcome: Progressing   Problem: Pain Managment: Goal: General experience of comfort will improve Outcome: Progressing   Problem: Safety: Goal: Ability to remain free from injury will improve Outcome: Progressing   Problem: Skin Integrity: Goal: Risk for impaired skin integrity will decrease Outcome: Progressing   Problem: Education: Goal: Ability to describe self-care measures that may prevent or decrease complications (Diabetes Survival Skills Education) will improve Outcome: Progressing Goal: Individualized Educational Video(s) Outcome: Progressing   Problem: Coping: Goal: Ability to adjust to condition or change in health will improve Outcome: Progressing   Problem: Fluid Volume: Goal: Ability to maintain a balanced intake and output will improve Outcome: Progressing   Problem: Health Behavior/Discharge Planning: Goal: Ability to identify and utilize available resources and services will improve Outcome: Progressing Goal: Ability to manage health-related needs will improve Outcome: Progressing   Problem: Metabolic: Goal: Ability to maintain appropriate glucose levels will improve Outcome: Progressing   Problem: Nutritional: Goal: Maintenance of adequate nutrition will improve Outcome: Progressing Goal: Progress toward achieving an optimal weight will improve Outcome: Progressing   Problem: Skin  Integrity: Goal: Risk for impaired skin integrity will decrease Outcome: Progressing   Problem: Tissue Perfusion: Goal: Adequacy of tissue perfusion will improve Outcome: Progressing   Problem: Education: Goal: Knowledge of General Education information will improve Description: Including pain rating scale, medication(s)/side effects and non-pharmacologic comfort measures Outcome: Not Progressing   Problem: Health Behavior/Discharge Planning: Goal: Ability to manage health-related needs will improve Outcome: Not Progressing   Problem: Clinical Measurements: Goal: Ability to maintain clinical measurements within normal limits will improve Outcome: Not Progressing   Problem: Activity: Goal: Risk for activity intolerance will decrease Outcome: Not Progressing   Problem: Coping: Goal: Level of anxiety will decrease Outcome: Not Progressing  Pt does not like being touched or repositioned.

## 2022-10-27 NOTE — TOC Progression Note (Addendum)
Transition of Care [facility]) - Progression Note    Patient Details  Name: Harry [name] MRN: [id number] Date of Birth: [birth year]  [facility] CM/SW Contact  [name], RN Phone Number: 10/27/2022, 9:35 AM  Clinical Narrative:    Called pt's dtr [name] [phone]) to give determination of KEPRO appeal; she verbalized understanding but says she is waiting for pt's lab results to be populated into MyChart so that she can review them and have discussion w/ hospitalist; [name] says she will make a determination at that time; Dr Pola Corn notified via secure chart.  -[id number]- CASE ID:  [id number]; d/c order cancelled; pt not medically stable for d/c; spoke w/ [name] at [facility]) [phone], option 1; she says her supervisor will call back; awaiting return call.  -1557- call back by [name], Nurse Reviewer from [facility]; she was informed pt d/c cancelled; she requested documentation be uploaded to [email or web address]; No planned discharge document and progress noted uploaded; given CONFIRMATION # [id number]; TOC is following  Expected Discharge Plan: Long Term Nursing Home Barriers to Discharge: No Barriers Identified  Expected Discharge Plan and Services   Discharge Planning Services: CM Consult Post Acute Care Choice: Nursing Home Living arrangements for the past 2 months:  (LTC) Expected Discharge Date: 10/26/22                                     Social Determinants of Health (SDOH) Interventions SDOH Screenings   Food Insecurity: No Food Insecurity (10/20/2022)  Housing: Patient Unable To Answer (10/20/2022)  Transportation Needs: No Transportation Needs (10/20/2022)  Utilities: Not At Risk (10/20/2022)  Depression (PHQ2-9): Low Risk  (03/01/2020)  Financial Resource Strain: Low Risk  (09/29/2018)  Physical Activity: Inactive (09/29/2018)  Tobacco Use: Medium Risk (10/20/2022)    Readmission Risk  Interventions    10/22/2022   11:00 AM 10/21/2022    9:19 AM 10/08/2022    1:37 PM  Readmission Risk Prevention Plan  Transportation Screening Complete Complete Complete  PCP or Specialist Appt within 3-5 Days  Complete   HRI or Home Care Consult  Complete   Social Work Consult for Recovery Care Planning/Counseling  Complete   Palliative Care Screening  Not Complete   Medication Review Oceanographer) Complete Not Complete Complete  PCP or Specialist appointment within 3-5 days of discharge Complete  Complete  HRI or Home Care Consult Complete    SW Recovery Care/Counseling Consult Complete    Palliative Care Screening Complete    Skilled Nursing Facility Not Applicable

## 2022-10-27 NOTE — Progress Notes (Signed)
PROGRESS NOTE  Harry [name]  DOB: 12/15/1945  PCP: Crist Fat, MD ZOX:[id number]  DOA: 10/20/2022  LOS: 7 days  Hospital Day: 8  Brief narrative: Harry [name] is a 77 y.o. male with PMH significant for h/o Lewy body dementia, DM2, HTN, HLD, strokes, dysphagia s/p PEG tube in place, CKD 3B, chronic anemia, anasarca, peripheral neuropathy. Long-term resident at [facility] nursing home. This is his fourth hospitalization in last 4 months. Most recent hospitalization was 7/30 to 8/13 for aspiration pneumonia.  At the time he also was seen by GI for anemia and underwent 1 unit of PRBC transfusion.  Daughter declined conversation with palliative care at the time. 8/17, patient was sent from the ED for increased agitation. Workup showed hemoglobin low at 6.8, FOBT positive. Admitted to [facility] GI was consulted  Subjective: Patient was seen and examined this morning. Elderly African-American male.  Lying down on bed. Only able to moan for me.  Not in physical distress.  Not able to have a conversation. Chart reviewed In the last 24 hours afebrile, heart rate in 90s, blood pressure in 150s this morning Most recent labs from this morning with hemoglobin at 7.5 which is lower than 7.9 yesterday, glucose this morning at 232  Assessment and plan: Acute on chronic anemia Chronic GI blood loss Baseline hemoglobin close to 9.  Hemoglobin low at 6.8 on admission with FOBT positive stool.  2 units of PRBC transfusion given.  GI was consulted.  Did not recommend endoscopic evaluation due to high scope intubation, aspiration and decompensation Currently on IV PPI.  At discharge to continue Nexium through PEG tube as well as Pepcid. Was chronically on Plavix after stroke.  Currently on hold.  Previous hospitalist discussed with family and started aspirin for risk reduction Hemoglobin trend as below showing slow downfall in the last 48 hours. Obtain repeat hemoglobin and type and screen  tomorrow for possible need of another transfusion. Recent Labs    06/03/22 1227 06/05/22 0518 09/07/22 1220 09/08/22 0715 10/04/22 0530 10/04/22 0531 10/04/22 1857 10/05/22 0500 10/23/22 0518 10/24/22 0455 10/25/22 0515 10/26/22 0447 10/27/22 0601  HGB 10.8*   < >  --    < >  --    < >  --    < > 7.9* 8.0* 8.3* 7.9* 7.5*  MCV 93.1   < >  --    < >  --    < >  --    < > 88.3 88.4 87.3 89.3 90.0  VITAMINB12 658  --  628  --   --   --   --   --   --   --   --   --   --   FOLATE  --   --  5.6*  --   --   --   --   --   --   --   --   --   --   FERRITIN  --   --  179  --   --   --  126  --   --   --   --   --   --   TIBC  --   --  200*  --   --   --  176*  --   --   --   --   --   --   IRON  --   --  7*  --   --   --  21*  --   --   --   --   --   --   RETICCTPCT  --   --   --   --  1.0  --   --   --   --   --   --   --   --    < > = values in this interval not displayed.   Acute hypoxic respiratory failure Recurrent aspiration pneumonia Most recent hospitalized was for aspiration pneumonia and, treatment with Zyvox. Initial chest x-ray this admission showed improvement in opacities but CT angio chest showed extensive intraluminal debris's within the tracheobronchial tree with near complete opacification of the left bronchial tree and scattered airway impaction within the right lower lobe, bibasilar pulmonary infiltrates -findings suggestive of aspiration. Currently on IV Zosyn.  Tentative plan of oral Augmentin at discharge. High risk of recurrent aspiration given significant dysphagia For aspiration precautions Recent Labs  Lab 10/23/22 0518 10/24/22 0455 10/25/22 0515 10/26/22 0447 10/27/22 0601  WBC 7.5 7.0 6.4 6.5 6.4  PROCALCITON 0.62 0.43 0.28 0.23 0.15   Chronic dysphagia s/p PEG tube Has PEG tube that was placed at [facility] on 7/15. Currently tolerating tube feeding at 50 mL/h.  Unable to tolerate any oral intake  Acute metabolic encephalopathy Vascular  dementia Lewy body dementia Has dementia with behavioral symptoms at baseline.  Primarily sent from nursing facility for agitation. Acute worsening was likely due to infection as well as hypoglycemia. Mental status remains poor Recent head CT/ MRI of the brain on last admission no acute finding. EEG on last admission with mild diffuse encephalopathy nonspecific etiology.  Continue to monitor  H/o recurrent stroke Last stroke 5 months ago.  Most recent CT head from 8/2 without any acute intracranial abnormality but showed chronic infarct within the left cerebellar hemisphere left occipital lobe, right cerebellar hemisphere, chronic bilateral basal ganglia and thalamic lacunar infarcts and moderate chronic microvascular ischemic changes and brain atrophy. Antiplatelet plan as above   Diarrheal illness with enterotoxigenic E. Coli Patient had significant diarrhea.  On IV Zosyn while in the hospital.   Improving Patient will be prescribed Augmentin on discharge that should cover for both aspiration pneumonia and E. coli diarrhea.  Hypertension Blood pressure is stable. Continue Coreg, amlodipine, hydralazine.  Type 2 diabetes mellitus A1c 6.5 on March 2024 Currently on SSI/Accu-Cheks.  Glucose trend as below.  Add Semglee 5 units this morning Recent Labs  Lab 10/26/22 1123 10/26/22 1606 10/26/22 2121 10/27/22 0725 10/27/22 1141  GLUCAP 205* 116* 136* 232* 175*   Urine retention w/ chronic Foley catheter in place   Continue the same.  Exchanged.   CKD stage IIIa BUN/creatinine stable and at about baseline. Recent Labs    10/14/22 0244 10/15/22 0317 10/16/22 0303 10/20/22 1149 10/21/22 0521 10/22/22 0454 10/23/22 0518 10/24/22 0455 10/25/22 0515 10/26/22 0447  BUN 24* 25* 27* 38* 32* 34* 30* 21 17 19   CREATININE 1.30* 1.22 1.18 0.88 0.92 1.19 1.19 1.09 1.09 1.04  CO2 28 27 25 26 27 23 22  21* 22 24   Multiple pressure ulcers POA Barrier dressing and local care.     Goals of care   Code Status: Full Code    DVT prophylaxis:  SCDs Start: 10/20/22 1508   Antimicrobials: IV Zosyn Fluid: None Consultants: GI Family Communication: Called discussed with patient's daughter Ms. [name]  Status: Inpatient Level of care:  Progressive   Patient is from: [facility] nursing home Needs to continue in-hospital  care: Back to nursing facility Anticipated d/c to: Will discharge today because of persistent drop in hemoglobin.  Plan to repeat hemoglobin possibly transfuse tomorrow again.   Diet:  Diet Order             Diet NPO time specified  Diet effective now                   Scheduled Meds:  amLODipine  5 mg Per Tube Daily   artificial tears  1 Application Both Eyes TID   aspirin  81 mg Per Tube Daily   carvedilol  6.25 mg Per Tube BID WC   Chlorhexidine Gluconate Cloth  6 each Topical Daily   feeding supplement (PROSource TF20)  60 mL Per Tube Daily   free water  150 mL Per Tube Q4H   hydrALAZINE  25 mg Per Tube Q8H   insulin aspart  0-5 Units Subcutaneous QHS   insulin aspart  0-9 Units Subcutaneous TID WC   insulin glargine-yfgn  5 Units Subcutaneous Daily   leptospermum manuka honey  1 Application Topical Daily   loratadine  10 mg Per Tube Daily   pantoprazole (PROTONIX) IV  40 mg Intravenous Q24H   pyridostigmine  30 mg Per Tube Q8H   rosuvastatin  5 mg Per Tube QHS   sertraline  25 mg Per Tube Daily    PRN meds: acetaminophen **OR** acetaminophen, liver oil-zinc oxide, ondansetron **OR** ondansetron (ZOFRAN) IV   Infusions:   feeding supplement (JEVITY 1.5 CAL/FIBER) 1,000 mL (10/27/22 0600)   piperacillin-tazobactam (ZOSYN)  IV 3.375 g (10/27/22 1204)    Antimicrobials: Anti-infectives (From admission, onward)    Start     Dose/Rate Route Frequency Ordered Stop   10/26/22 0000  amoxicillin-clavulanate (AUGMENTIN) 250-62.5 MG/5ML suspension        500 mg Oral 2 times daily 10/26/22 0844 10/31/22 2359   10/21/22  1000  ceFEPIme (MAXIPIME) 2 g in sodium chloride 0.9 % 100 mL IVPB  Status:  Discontinued        2 g 200 mL/hr over 30 Minutes Intravenous Every 8 hours 10/21/22 0804 10/21/22 0811   10/21/22 1000  piperacillin-tazobactam (ZOSYN) IVPB 3.375 g        3.375 g 12.5 mL/hr over 240 Minutes Intravenous Every 8 hours 10/21/22 0811     10/20/22 2200  linezolid (ZYVOX) tablet 600 mg        600 mg Per Tube Every 12 hours 10/20/22 1459 10/22/22 2359       Nutritional status:  Body mass index is 24.74 kg/m.  Nutrition Problem: Increased nutrient needs Etiology: wound healing Signs/Symptoms: estimated needs     Objective: Vitals:   10/27/22 0548 10/27/22 1324  BP: (!) 152/84 (!) 145/63  Pulse: 99 84  Resp:    Temp: 98.3 F (36.8 C) 98.5 F (36.9 C)  SpO2: 99% 98%    Intake/Output Summary (Last 24 hours) at 10/27/2022 1535 Last data filed at 10/27/2022 0256 Gross per 24 hour  Intake 184.19 ml  Output 1350 ml  Net -1165.81 ml   Filed Weights   10/25/22 0700 10/26/22 0500 10/27/22 0548  Weight: 88 kg 93 kg 76 kg   Weight change: -17 kg Body mass index is 24.74 kg/m.   Physical Exam: General exam: Elderly African-American male.  Not in physical distress Skin: No rashes, lesions or ulcers. HEENT: Atraumatic, normocephalic, no obvious bleeding Lungs: Clear to auscultation bilaterally CVS: Regular rate and rhythm, no murmur GI/Abd soft, nontender, nondistended,  bowel present CNS: Unable to have a conversation.  Only mild abnormal command.  Poor baseline Psychiatry: Unable to assess given inability to have a conversation Extremities: No pedal edema, no calf tenderness  Data Review: I have personally reviewed the laboratory data and studies available.  F/u labs ordered Unresulted Labs (From admission, onward)     Start     Ordered   10/28/22 0500  CBC with Differential/Platelet  Tomorrow morning,   R       Question:  Specimen collection method  Answer:  Lab=Lab collect    10/27/22 0741   10/28/22 0500  Basic metabolic panel  Tomorrow morning,   R       Question:  Specimen collection method  Answer:  Lab=Lab collect   10/27/22 0741   10/28/22 0500  Type and screen  Once,   R        10/27/22 0741   10/21/22 0500  Procalcitonin  Daily,   R     References:    Procalcitonin Lower Respiratory Tract Infection AND Sepsis Procalcitonin Algorithm   10/20/22 1526            Total time spent in review of labs and imaging, patient evaluation, formulation of plan, documentation and communication with family: 55 minutes  Signed, Lorin Glass, MD [facility] 10/27/2022

## 2022-10-27 NOTE — Progress Notes (Signed)
    Patient Name: ZAYQUAN EHRLER           DOB: Nov 12, 1945  MRN: [id number]      Admission Date: 10/20/2022  Attending Provider: Lorin Glass, MD  Primary Diagnosis: Anemia   Level of care: Progressive    CROSS COVER NOTE   Date of Service   10/27/2022   EXTON SUHRE, 77 y.o. male, was admitted on 10/20/2022 for Anemia.    HPI/Events of Note   Family at bedside concerned with labored breathing and increased extremity edema.  Vital signs stable.  Lungs bilaterally diminished.  No hypoxia or increased oxygen need at this time.  Currently receiving IV Zosyn for aspiration pneumonia. Will add neb treatment and chest x-ray.   Addendum-chest x-ray impression 1. Bilateral perihilar airspace opacities, left greater than right, suspicious for pneumonia. 2. Suspected left pleural effusion and possibly small right pleural effusion. 3. Obscuration left hemidiaphragm compatible with atelectasis or pneumonia.  Interventions/ Plan   Neb treatment Chest x-ray IV Lasix        Anthoney Harada, DNP, ACNPC- AG [facility] [place]

## 2022-10-28 DIAGNOSIS — D649 Anemia, unspecified: Secondary | ICD-10-CM | POA: Diagnosis not present

## 2022-10-28 LAB — CBC WITH DIFFERENTIAL/PLATELET
Abs Immature Granulocytes: 0.1 10*3/uL — ABNORMAL HIGH (ref 0.00–0.07)
Basophils Absolute: 0 10*3/uL (ref 0.0–0.1)
Basophils Relative: 1 %
Eosinophils Absolute: 0.5 10*3/uL (ref 0.0–0.5)
Eosinophils Relative: 8 %
HCT: 22.4 % — ABNORMAL LOW (ref 39.0–52.0)
Hemoglobin: 6.7 g/dL — CL (ref 13.0–17.0)
Immature Granulocytes: 2 %
Lymphocytes Relative: 17 %
Lymphs Abs: 1.1 10*3/uL (ref 0.7–4.0)
MCH: 27.3 pg (ref 26.0–34.0)
MCHC: 29.9 g/dL — ABNORMAL LOW (ref 30.0–36.0)
MCV: 91.4 fL (ref 80.0–100.0)
Monocytes Absolute: 0.8 10*3/uL (ref 0.1–1.0)
Monocytes Relative: 13 %
Neutro Abs: 3.8 10*3/uL (ref 1.7–7.7)
Neutrophils Relative %: 59 %
Platelets: 178 10*3/uL (ref 150–400)
RBC: 2.45 MIL/uL — ABNORMAL LOW (ref 4.22–5.81)
RDW: 18 % — ABNORMAL HIGH (ref 11.5–15.5)
WBC: 6.3 10*3/uL (ref 4.0–10.5)
nRBC: 0 % (ref 0.0–0.2)

## 2022-10-28 LAB — BASIC METABOLIC PANEL
Anion gap: 7 (ref 5–15)
BUN: 25 mg/dL — ABNORMAL HIGH (ref 8–23)
CO2: 26 mmol/L (ref 22–32)
Calcium: 8.1 mg/dL — ABNORMAL LOW (ref 8.9–10.3)
Chloride: 108 mmol/L (ref 98–111)
Creatinine, Ser: 1.16 mg/dL (ref 0.61–1.24)
GFR, Estimated: >60 mL/min (ref >60)
Glucose, Bld: 192 mg/dL — ABNORMAL HIGH (ref 70–99)
Potassium: 3.5 mmol/L (ref 3.5–5.1)
Sodium: 141 mmol/L (ref 135–145)

## 2022-10-28 LAB — CBC
HCT: 23.4 % — ABNORMAL LOW (ref 39.0–52.0)
Hemoglobin: 7.4 g/dL — ABNORMAL LOW (ref 13.0–17.0)
MCH: 28 pg (ref 26.0–34.0)
MCHC: 31.6 g/dL (ref 30.0–36.0)
MCV: 88.6 fL (ref 80.0–100.0)
Platelets: 172 10*3/uL (ref 150–400)
RBC: 2.64 MIL/uL — ABNORMAL LOW (ref 4.22–5.81)
RDW: 17.3 % — ABNORMAL HIGH (ref 11.5–15.5)
WBC: 6.6 10*3/uL (ref 4.0–10.5)
nRBC: 0 % (ref 0.0–0.2)

## 2022-10-28 LAB — GLUCOSE, CAPILLARY
Glucose-Capillary: 185 mg/dL — ABNORMAL HIGH (ref 70–99)
Glucose-Capillary: 198 mg/dL — ABNORMAL HIGH (ref 70–99)
Glucose-Capillary: 89 mg/dL (ref 70–99)
Glucose-Capillary: 136 mg/dL — ABNORMAL HIGH (ref 70–99)
Glucose-Capillary: 44 mg/dL — CL (ref 70–99)

## 2022-10-28 LAB — PREPARE RBC (CROSSMATCH)

## 2022-10-28 LAB — PROCALCITONIN: Procalcitonin: 0.2 ng/mL

## 2022-10-28 MED ORDER — SODIUM CHLORIDE 0.9% IV SOLUTION: Freq: Once | INTRAVENOUS | Status: AC

## 2022-10-28 MED ORDER — PANTOPRAZOLE SODIUM 40 MG IV SOLR
40.0000 mg | Freq: Two times a day (BID) | INTRAVENOUS | Status: DC
  Administered 2022-10-28 – 2022-10-30 (×4): 40 mg via INTRAVENOUS
  Filled 2022-10-28 (×4): qty 10

## 2022-10-28 MED ORDER — DEXTROSE 50 % IV SOLN
INTRAVENOUS | Status: AC
  Administered 2022-10-28: 50 mL
  Filled 2022-10-28: qty 50

## 2022-10-28 NOTE — Progress Notes (Signed)
PROGRESS NOTE  Harry [name]  DOB: 1946-02-27  PCP: Crist Fat, MD AVW:[id number]  DOA: 10/20/2022  LOS: 8 days  Hospital Day: 9  Brief narrative: Harry [name] is a 77 y.o. male with PMH significant for h/o Lewy body dementia, DM2, HTN, HLD, strokes, dysphagia s/p PEG tube in place, CKD 3B, chronic anemia, anasarca, peripheral neuropathy. Long-term resident at [facility] nursing home. This is his fourth hospitalization in last 4 months. Most recent hospitalization was 7/30 to 8/13 for aspiration pneumonia.  At the time he also was seen by GI for anemia and underwent 1 unit of PRBC transfusion.  Daughter declined conversation with palliative care at the time. 8/17, patient was sent from the ED for increased agitation. Workup showed hemoglobin low at 6.8, FOBT positive. Admitted to [facility] GI was consulted  Subjective: Patient was seen and examined this morning. Lying on bed.  Only moans on command. Per RN, he has been having dark tarry stool. Labs this morning with hemoglobin down to 6.7.  1 unit of PRBC transfusion ordered.  Assessment and plan: Acute on chronic anemia Chronic GI blood loss Baseline hemoglobin close to 9.  Hemoglobin low at 6.8 on admission with FOBT positive stool.  2 units of PRBC transfusion given.  GI was consulted.  Did not recommend endoscopic evaluation due to high scope intubation, aspiration and decompensation Continues to have dark tarry stool and dropping hemoglobin. Hemoglobin low at 6.7 this morning.  1 more unit of PRBC transfusion ordered.  I stopped aspirin. Continue on IV PPI.   I have sent a message to on-call GI Dr. Bosie Clos requesting an inpatient follow-up. Recent Labs    06/03/22 1227 06/05/22 0518 09/07/22 1220 09/08/22 0715 10/04/22 0530 10/04/22 0531 10/04/22 1857 10/05/22 0500 10/24/22 0455 10/25/22 0515 10/26/22 0447 10/27/22 0601 10/28/22 0617  HGB 10.8*   < >  --    < >  --    < >  --    < > 8.0* 8.3* 7.9* 7.5*  6.7*  MCV 93.1   < >  --    < >  --    < >  --    < > 88.4 87.3 89.3 90.0 91.4  VITAMINB12 658  --  628  --   --   --   --   --   --   --   --   --   --   FOLATE  --   --  5.6*  --   --   --   --   --   --   --   --   --   --   FERRITIN  --   --  179  --   --   --  126  --   --   --   --   --   --   TIBC  --   --  200*  --   --   --  176*  --   --   --   --   --   --   IRON  --   --  7*  --   --   --  21*  --   --   --   --   --   --   RETICCTPCT  --   --   --   --  1.0  --   --   --   --   --   --   --   --    < > =  values in this interval not displayed.   Acute hypoxic respiratory failure Recurrent aspiration pneumonia Most recent hospitalized was for aspiration pneumonia and, treatment with Zyvox. Initial chest x-ray this admission showed improvement in opacities but CT angio chest showed extensive intraluminal debris's within the tracheobronchial tree with near complete opacification of the left bronchial tree and scattered airway impaction within the right lower lobe, bibasilar pulmonary infiltrates -findings suggestive of aspiration. Currently on IV Zosyn.  Tentative plan of oral Augmentin at discharge. High risk of recurrent aspiration given significant dysphagia For aspiration precautions Recent Labs  Lab 10/24/22 0455 10/25/22 0515 10/26/22 0447 10/27/22 0601 10/28/22 0617  WBC 7.0 6.4 6.5 6.4 6.3  PROCALCITON 0.43 0.28 0.23 0.15 0.20   Chronic dysphagia s/p PEG tube Has PEG tube that was placed at [facility] on 7/15. Currently tolerating tube feeding at 50 mL/h.  Unable to tolerate any oral intake  Acute metabolic encephalopathy Vascular dementia Lewy body dementia Has dementia with behavioral symptoms at baseline.  Primarily sent from nursing facility for agitation. Acute worsening was likely due to infection as well as hypoglycemia. Mental status remains poor Recent head CT/ MRI of the brain on last admission no acute finding. EEG on last admission with mild diffuse  encephalopathy nonspecific etiology.  Continue to monitor  H/o recurrent stroke Last stroke 5 months ago.  Most recent CT head from 8/2 without any acute intracranial abnormality but showed chronic infarct within the left cerebellar hemisphere left occipital lobe, right cerebellar hemisphere, chronic bilateral basal ganglia and thalamic lacunar infarcts and moderate chronic microvascular ischemic changes and brain atrophy. Was chronically on Plavix after stroke.  On admission it was held because of anemia.  Previous hospitalist discussed with family and started aspirin for risk reduction.  With continued drop in hemoglobin, I would stop aspirin as well.   Diarrheal illness with enterotoxigenic E. Coli Patient had significant diarrhea.  On IV Zosyn while in the hospital.   Improving Patient will be prescribed Augmentin on discharge that should cover for both aspiration pneumonia and E. coli diarrhea.  Hypertension Blood pressure is stable. Continue Coreg, amlodipine, hydralazine.  Type 2 diabetes mellitus A1c 6.5 on March 2024 Currently on Semglee 5 units daily and SSI/Accu-Cheks.  Glucose trend as below.   Recent Labs  Lab 10/27/22 0725 10/27/22 1141 10/27/22 1633 10/27/22 1954 10/28/22 0731  GLUCAP 232* 175* 166* 158* 185*   Urine retention w/ chronic Foley catheter in place   Continue the same.  Exchanged.   CKD stage IIIa BUN/creatinine stable and at about baseline. Recent Labs    10/15/22 0317 10/16/22 0303 10/20/22 1149 10/21/22 0521 10/22/22 0454 10/23/22 0518 10/24/22 0455 10/25/22 0515 10/26/22 0447 10/28/22 0617  BUN 25* 27* 38* 32* 34* 30* 21 17 19  25*  CREATININE 1.22 1.18 0.88 0.92 1.19 1.19 1.09 1.09 1.04 1.16  CO2 27 25 26 27 23 22  21* 22 24 26    Multiple pressure ulcers POA Barrier dressing and local care.    Goals of care   Code Status: Full Code    DVT prophylaxis:  SCDs Start: 10/20/22 1508   Antimicrobials: IV Zosyn Fluid:  None Consultants: GI Family Communication: Called discussed with patient's daughter Ms. [name] yesterday 8/24.  Status: Inpatient Level of care:  Progressive   Patient is from: [facility] nursing home Needs to continue in-hospital care: Back to nursing facility Anticipated d/c to: Unclear yet.  Continues to drop hemoglobin   Diet:  Diet Order  Diet NPO time specified  Diet effective now                   Scheduled Meds:  sodium chloride   Intravenous Once   amLODipine  5 mg Per Tube Daily   artificial tears  1 Application Both Eyes TID   carvedilol  6.25 mg Per Tube BID WC   Chlorhexidine Gluconate Cloth  6 each Topical Daily   feeding supplement (PROSource TF20)  60 mL Per Tube Daily   free water  150 mL Per Tube Q4H   hydrALAZINE  25 mg Per Tube Q8H   insulin aspart  0-5 Units Subcutaneous QHS   insulin aspart  0-9 Units Subcutaneous TID WC   insulin glargine-yfgn  5 Units Subcutaneous Daily   leptospermum manuka honey  1 Application Topical Daily   loratadine  10 mg Per Tube Daily   pantoprazole (PROTONIX) IV  40 mg Intravenous Q24H   pyridostigmine  30 mg Per Tube Q8H   rosuvastatin  5 mg Per Tube QHS   sertraline  25 mg Per Tube Daily    PRN meds: acetaminophen **OR** acetaminophen, ipratropium-albuterol, liver oil-zinc oxide, ondansetron **OR** ondansetron (ZOFRAN) IV   Infusions:   feeding supplement (JEVITY 1.5 CAL/FIBER) 1,000 mL (10/27/22 0600)   piperacillin-tazobactam (ZOSYN)  IV 3.375 g (10/28/22 0213)    Antimicrobials: Anti-infectives (From admission, onward)    Start     Dose/Rate Route Frequency Ordered Stop   10/26/22 0000  amoxicillin-clavulanate (AUGMENTIN) 250-62.5 MG/5ML suspension        500 mg Oral 2 times daily 10/26/22 0844 10/31/22 2359   10/21/22 1000  ceFEPIme (MAXIPIME) 2 g in sodium chloride 0.9 % 100 mL IVPB  Status:  Discontinued        2 g 200 mL/hr over 30 Minutes Intravenous Every 8 hours 10/21/22 0804  10/21/22 0811   10/21/22 1000  piperacillin-tazobactam (ZOSYN) IVPB 3.375 g        3.375 g 12.5 mL/hr over 240 Minutes Intravenous Every 8 hours 10/21/22 0811     10/20/22 2200  linezolid (ZYVOX) tablet 600 mg        600 mg Per Tube Every 12 hours 10/20/22 1459 10/22/22 2359       Nutritional status:  Body mass index is 24.74 kg/m.  Nutrition Problem: Increased nutrient needs Etiology: wound healing Signs/Symptoms: estimated needs     Objective: Vitals:   10/28/22 0826 10/28/22 0845  BP: (!) 160/57 (!) 155/59  Pulse: 89   Resp:    Temp: 98.2 F (36.8 C) 98.8 F (37.1 C)  SpO2: 90% 92%    Intake/Output Summary (Last 24 hours) at 10/28/2022 1023 Last data filed at 10/28/2022 0700 Gross per 24 hour  Intake 2828.98 ml  Output 1250 ml  Net 1578.98 ml   Filed Weights   10/25/22 0700 10/26/22 0500 10/27/22 0548  Weight: 88 kg 93 kg 76 kg   Weight change:  Body mass index is 24.74 kg/m.   Physical Exam: General exam: Elderly African-American male.  Not in physical distress Skin: No rashes, lesions or ulcers. HEENT: Atraumatic, normocephalic, no obvious bleeding Lungs: Clear to auscultation bilaterally CVS: Regular rate and rhythm, no murmur GI/Abd soft, nontender, nondistended, bowel present CNS: Unable to have a conversation.  Only moans on verbal command.  Poor baseline Psychiatry: Unable to assess given inability to have a conversation Extremities: No pedal edema, no calf tenderness  Data Review: I have personally reviewed the laboratory data and  studies available.  F/u labs ordered Unresulted Labs (From admission, onward)     Start     Ordered   10/21/22 0500  Procalcitonin  Daily,   R     References:    Procalcitonin Lower Respiratory Tract Infection AND Sepsis Procalcitonin Algorithm   10/20/22 1526            Total time spent in review of labs and imaging, patient evaluation, formulation of plan, documentation and communication with family: 55  minutes  Signed, Lorin Glass, MD [facility] 10/28/2022

## 2022-10-28 NOTE — Progress Notes (Signed)
I held his tube feeds for 3 hours and aspirated. The fluid was light yellow in color constant with bile and digested tube feed.

## 2022-10-28 NOTE — Progress Notes (Signed)
[facility] Gastroenterology Progress Note  Harry [name] 77 y.o. 1945/03/09   Subjective: Black stools yesterday. Pt nonverbal. No family in room. Discussed with nursing.  Objective: Vital signs: Vitals:   10/28/22 1045 10/28/22 1210  BP: (!) 162/97 (!) 156/67  Pulse: 81 79  Resp:  (!) 23  Temp: 98.4 F (36.9 C) 97.8 F (36.6 C)  SpO2: 100% 98%    Physical Exam: Gen: nonverbal, demented, contractures, no acute distress, elderly, chronically ill-appearing CV: RRR Chest: CTA B Abd: soft, nontender, nondistended, +BS Ext: contractures; no edema  Lab Results: Recent Labs    10/26/22 0447 10/28/22 0617  NA 140 141  K 3.5 3.5  CL 108 108  CO2 24 26  GLUCOSE 205* 192*  BUN 19 25*  CREATININE 1.04 1.16  CALCIUM 7.9* 8.1*   No results for input(s): "AST", "ALT", "ALKPHOS", "BILITOT", "PROT", "ALBUMIN" in the last 72 hours. Recent Labs    10/27/22 0601 10/28/22 0617  WBC 6.4 6.3  NEUTROABS 4.0 3.8  HGB 7.5* 6.7*  HCT 24.2* 22.4*  MCV 90.0 91.4  PLT 156 178      Assessment/Plan: Melenic stools and drop in Hgb to 6.7 today (7.5). Transfused 1 U this morning. PEG tube flushed and no black or red fluid returned by nursing. Source of melena and anemia may be colonic or small bowel and less likely stomach. Doubt ulcer bleed. Increased risk for diffuse mucosal bleeding that is not treatable. If black stools recur, then hold tube feeds again and flush PEG tube again and see if any black fluid aspirate. I would not recommend an endoscopy due to his multiple comorbidities and likelihood of needing intubation prior to endoscopy for airway protection unless evidence of black fluid from PEG suggesting an upper tract bleed. Follow H/H. Dr. Lorenso Quarry will f/u tomorrow.   [name] 10/28/2022, 2:06 PM  Questions please call [phone] ID: Harry [name], male   DOB: [birth year], 77 y.o.   MRN: [id number]

## 2022-10-28 NOTE — Progress Notes (Signed)
Pt daughter during visit at shift change, complains that pt is labored breathing, swollen more than usual. Vs stable. Lung sound diminished. NP on call made aware and new order given.

## 2022-10-29 DIAGNOSIS — D649 Anemia, unspecified: Secondary | ICD-10-CM | POA: Diagnosis not present

## 2022-10-29 LAB — CBC WITH DIFFERENTIAL/PLATELET
Abs Immature Granulocytes: 0.05 10*3/uL (ref 0.00–0.07)
Basophils Absolute: 0 10*3/uL (ref 0.0–0.1)
Basophils Relative: 1 %
Eosinophils Absolute: 0.4 10*3/uL (ref 0.0–0.5)
Eosinophils Relative: 9 %
HCT: 23.5 % — ABNORMAL LOW (ref 39.0–52.0)
Hemoglobin: 7.2 g/dL — ABNORMAL LOW (ref 13.0–17.0)
Immature Granulocytes: 1 %
Lymphocytes Relative: 17 %
Lymphs Abs: 0.9 10*3/uL (ref 0.7–4.0)
MCH: 27.5 pg (ref 26.0–34.0)
MCHC: 30.6 g/dL (ref 30.0–36.0)
MCV: 89.7 fL (ref 80.0–100.0)
Monocytes Absolute: 0.5 10*3/uL (ref 0.1–1.0)
Monocytes Relative: 11 %
Neutro Abs: 3.1 10*3/uL (ref 1.7–7.7)
Neutrophils Relative %: 61 %
Platelets: 214 10*3/uL (ref 150–400)
RBC: 2.62 MIL/uL — ABNORMAL LOW (ref 4.22–5.81)
RDW: 17.4 % — ABNORMAL HIGH (ref 11.5–15.5)
WBC: 4.9 10*3/uL (ref 4.0–10.5)
nRBC: 0 % (ref 0.0–0.2)

## 2022-10-29 LAB — BPAM RBC
Blood Product Expiration Date: 202409222359
ISSUE DATE / TIME: 202408250817
Unit Type and Rh: 7300

## 2022-10-29 LAB — TYPE AND SCREEN
ABO/RH(D): B POS
Antibody Screen: NEGATIVE
Unit division: 0

## 2022-10-29 LAB — PROCALCITONIN: Procalcitonin: <0.1 ng/mL

## 2022-10-29 LAB — GLUCOSE, CAPILLARY
Glucose-Capillary: 124 mg/dL — ABNORMAL HIGH (ref 70–99)
Glucose-Capillary: 132 mg/dL — ABNORMAL HIGH (ref 70–99)
Glucose-Capillary: 155 mg/dL — ABNORMAL HIGH (ref 70–99)
Glucose-Capillary: 194 mg/dL — ABNORMAL HIGH (ref 70–99)
Glucose-Capillary: 174 mg/dL — ABNORMAL HIGH (ref 70–99)

## 2022-10-29 MED ORDER — JEVITY 1.5 CAL/FIBER PO LIQD
1000.0000 mL | ORAL | Status: DC
  Administered 2022-10-29 – 2022-10-30 (×2): 1000 mL
  Filled 2022-10-29 (×2): qty 1000

## 2022-10-29 NOTE — Progress Notes (Signed)
Speech Language Pathology Treatment: Dysphagia  Patient Details Name: Harry [name] MRN: [id number] DOB: 02/13/1946 Today's Date: 10/29/2022 Time: 1450-1500 SLP Time Calculation (min) (ACUTE ONLY): 10 min  Assessment / Plan / Recommendation Clinical Impression  Patient seen by SLP for skilled treatment focused on dysphagia. Patient was initially awake and alert, verbally responding and following basic commands for oral care. Oral mucosa was sticky but moist and no significant amount of secretions observed. Patient was receptive to having a couple small ice chips. He did exhibit mastication with ice chips but no swallow initiation was palpated and he then exhibited a delayed cough response. He started to close his eyes and appear more sleepy/lethargic and no further PO's given. SLP continues to recommend NPO status and will plan to f/u at least one more time with focus more on family education.    HPI HPI: 77 year old male with history of Lewy body dementia,history of CVA, CKD 3B dysphagia with PEG tube in place, anasarca ,chronic anemia ,hyperlipidemia, diabetes ,hypertension brought to the ED from [facility] with moaning and groaning and with increased agitation,recently diagnosed with pneumonia. Recently discharged on 8/13 where he was treated for aspiration pneumonia. Chest x-ray with improved opacification and small pleural effusion, however repeat chest x-ray with more extensive consolidations. MBS 10/08/22 NPO, continue PEG. Pt seen 8/12 with recommendation for ice chips and nectar thick with RN to decrease disuse muscle atrophy and maximize quality of life.      SLP Plan  Continue with current plan of care      Recommendations for follow up therapy are one component of a multi-disciplinary discharge planning process, led by the attending physician.  Recommendations may be updated based on patient status, additional functional criteria and insurance authorization.    Recommendations   Diet recommendations: NPO Medication Administration: Via alternative means                  Oral care QID   Frequent or constant Supervision/Assistance Dysphagia, oropharyngeal phase (R13.12)     Continue with current plan of care     Angela Nevin, MA, CCC-SLP Speech Therapy
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
## 2022-10-29 NOTE — TOC Progression Note (Signed)
Transition of Care [facility]) - Progression Note    Patient Details  Name: Harry [name] MRN: [id number] Date of Birth: June 20, 1945  [facility]) CM/SW Contact  Tarrence Enck, Olegario Messier, RN Phone Number: 10/29/2022, 8:53 AM  Clinical Narrative:  Awaiting medical stability for asst w/d/c either return back to [facility]LTC PEG/osmolite only or another appropriate level of care.     Expected Discharge Plan: Long Term Nursing Home Barriers to Discharge: Continued Medical Work up  Expected Discharge Plan and Services   Discharge Planning Services: CM Consult Post Acute Care Choice: Nursing Home Living arrangements for the past 2 months:  (LTC) Expected Discharge Date: 10/26/22                                     Social Determinants of Health (SDOH) Interventions SDOH Screenings   Food Insecurity: No Food Insecurity (10/20/2022)  Housing: Patient Unable To Answer (10/20/2022)  Transportation Needs: No Transportation Needs (10/20/2022)  Utilities: Not At Risk (10/20/2022)  Depression (PHQ2-9): Low Risk  (03/01/2020)  Financial Resource Strain: Low Risk  (09/29/2018)  Physical Activity: Inactive (09/29/2018)  Tobacco Use: Medium Risk (10/20/2022)    Readmission Risk Interventions    10/22/2022   11:00 AM 10/21/2022    9:19 AM 10/08/2022    1:37 PM  Readmission Risk Prevention Plan  Transportation Screening Complete Complete Complete  PCP or Specialist Appt within 3-5 Days  Complete   HRI or Home Care Consult  Complete   Social Work Consult for Recovery Care Planning/Counseling  Complete   Palliative Care Screening  Not Complete   Medication Review Oceanographer) Complete Not Complete Complete  PCP or Specialist appointment within 3-5 days of discharge Complete  Complete  HRI or Home Care Consult Complete    SW Recovery Care/Counseling Consult Complete    Palliative Care Screening Complete    Skilled Nursing Facility Not Applicable

## 2022-10-29 NOTE — Progress Notes (Signed)
Nutrition Follow-up  DOCUMENTATION CODES:   Not applicable  INTERVENTION:  - Per Dr. Pola Corn can start increasing tube feeds towards goal:  Jevity 1.5 at 60 ml/h Prosource TF20 60 ml daily *Increase to 21mL/hr and after 8 hours can then increase to goal of 60mL + Free water flushes: 150 ml every 4 hours (900 ml) Provides 2160 kcal, 112 gm protein, 1094 ml free water daily  - Monitor weight trends.   - Per TOC, patient to discharge to facility which does not have Jevity 1.5, provides Osmolite 1.5. Once patient discharges to facility, can utilize equivalent formula as appropriate.   NUTRITION DIAGNOSIS:   Increased nutrient needs related to wound healing as evidenced by estimated needs. *ongoing  GOAL:   Patient will meet greater than or equal to 90% of their needs *progressing  MONITOR:   TF tolerance  REASON FOR ASSESSMENT:   Consult Enteral/tube feeding initiation and management  ASSESSMENT:   77 y.o. admits related to drowsiness and hypoxia. PMH includes: bronchitis, stroke, pyelonephritis, AKI, CAP, CKD, HLD, HTN, PVD, stroke, T2DM. Pt is currently receiving medical management related to anemia.  Patient was supposed to discharge 8/23 to [facility] LTC but daughter appealed discharge.  TF has been infusing at 20mL/hr since 8/23. No noted issues since that time.  Per GI note 8/19, if concern for aspiration of gastric tube feeds can consult IR for possible J extension to G-tube given aspiration. However, GI signed off at that time.  Per discussion with Dr. Pola Corn, can start increasing tube feeds today back to goal of 39mL/hr. Plan to increase to 38mL/hr and wait 8 hours before advancing to 74mL/hr. Discussed plan with RN.  Last SLP eval 8/22, recommended continued NPO.  Admit weight: 171# Current weight: 180# *Patient with right and left lower extremity edema and is +4.3L which is likely affecting weight status  Medications reviewed and include: Q4H  FWF  Labs reviewed:  No BMP today HA1C 6.5 Blood Glucose 44-198 x24 hours   Diet Order:   Diet Order             Diet NPO time specified  Diet effective now                   EDUCATION NEEDS:  Not appropriate for education at this time  Skin:  Skin Integrity Issues:: Stage II DTI: R heel Stage II: Mid sacrum  Last BM:  8/25 - type 6  Height:  Ht Readings from Last 1 Encounters:  10/20/22 5\' 9"  (1.753 m)   Weight:  Wt Readings from Last 1 Encounters:  10/29/22 82 kg    BMI:  Body mass index is 26.7 kg/m.  Estimated Nutritional Needs:  Kcal:  2100-2300 Protein:  105-115g Fluid:  2.1L/day    Shelle Iron RD, LDN For contact information, refer to [facility].

## 2022-10-29 NOTE — Progress Notes (Signed)
PROGRESS NOTE  Harry [name]  DOB: 1945/05/12  PCP: Crist Fat, MD WUJ:[id number]  DOA: 10/20/2022  LOS: 9 days  Hospital Day: 10  Brief narrative: Harry [name] is a 77 y.o. male with PMH significant for h/o Lewy body dementia, DM2, HTN, HLD, strokes, dysphagia s/p PEG tube in place, CKD 3B, chronic anemia, anasarca, peripheral neuropathy. Long-term resident at [facility] nursing home. This is his fourth hospitalization in last 4 months. Most recent hospitalization was 7/30 to 8/13 for aspiration pneumonia.  At the time he also was seen by GI for anemia and underwent 1 unit of PRBC transfusion.  Daughter declined conversation with palliative care at the time. 8/17, patient was sent from the ED for increased agitation. Workup showed hemoglobin low at 6.8, FOBT positive. Admitted to [facility] GI was consulted  Subjective: Patient was seen and examined this morning. Lying on bed.  Per RN, he is surprisingly more talkative today.  But tends to behave off-and-on.  At the time of my eval, he was only saying no to any question.  He refused to get her blood work done earlier. Continues to have black tarry stool. Pending labs today.  Assessment and plan: Acute on chronic anemia Chronic GI blood loss Baseline hemoglobin close to 9.  Hemoglobin low at 6.8 on admission with FOBT positive stool.  2 units of PRBC transfusion given.  GI was consulted.  Did not recommend endoscopic evaluation due to high scope intubation, aspiration and decompensation Continues to have dark tarry stool and dropping hemoglobin. Trend as below.  Patient refused blood work today but will try again this afternoon. Aspirin is on hold. Continue IV PPI  GI follow-up appreciated.  I have sent a message to on-call GI Dr. Bosie Clos requesting an inpatient follow-up. Recent Labs    06/03/22 1227 06/05/22 0518 09/07/22 1220 09/08/22 0715 10/04/22 0530 10/04/22 0531 10/04/22 1857 10/05/22 0500 10/25/22 0515  10/26/22 0447 10/27/22 0601 10/28/22 0617 10/28/22 1942  HGB 10.8*   < >  --    < >  --    < >  --    < > 8.3* 7.9* 7.5* 6.7* 7.4*  MCV 93.1   < >  --    < >  --    < >  --    < > 87.3 89.3 90.0 91.4 88.6  VITAMINB12 658  --  628  --   --   --   --   --   --   --   --   --   --   FOLATE  --   --  5.6*  --   --   --   --   --   --   --   --   --   --   FERRITIN  --   --  179  --   --   --  126  --   --   --   --   --   --   TIBC  --   --  200*  --   --   --  176*  --   --   --   --   --   --   IRON  --   --  7*  --   --   --  21*  --   --   --   --   --   --   RETICCTPCT  --   --   --   --  1.0  --   --   --   --   --   --   --   --    < > = values in this interval not displayed.   Acute hypoxic respiratory failure Recurrent aspiration pneumonia Most recent hospitalized was for aspiration pneumonia and, treatment with Zyvox. Initial chest x-ray this admission showed improvement in opacities but CT angio chest showed extensive intraluminal debris's within the tracheobronchial tree with near complete opacification of the left bronchial tree and scattered airway impaction within the right lower lobe, bibasilar pulmonary infiltrates -findings suggestive of aspiration. Currently on oral Augmentin. High risk of recurrent aspiration given significant dysphagia For aspiration precautions Recent Labs  Lab 10/25/22 0515 10/26/22 0447 10/27/22 0601 10/28/22 0617 10/28/22 1942 10/29/22 0547  WBC 6.4 6.5 6.4 6.3 6.6  --   PROCALCITON 0.28 0.23 0.15 0.20  --  <0.10   Chronic dysphagia s/p PEG tube Has PEG tube that was placed at [facility] on 7/15. Currently tolerating tube feeding at 50 mL/h.  Unable to tolerate any oral intake  Acute metabolic encephalopathy Vascular dementia Lewy body dementia Has dementia with behavioral symptoms at baseline.  Primarily sent from nursing facility for agitation. Acute worsening was likely due to infection as well as hypoglycemia. Mental status remains  poor Recent head CT/ MRI of the brain on last admission no acute finding. EEG on last admission with mild diffuse encephalopathy nonspecific etiology.  Continue to monitor  H/o recurrent stroke Last stroke 5 months ago.  Most recent CT head from 8/2 without any acute intracranial abnormality but showed chronic infarct within the left cerebellar hemisphere left occipital lobe, right cerebellar hemisphere, chronic bilateral basal ganglia and thalamic lacunar infarcts and moderate chronic microvascular ischemic changes and brain atrophy. Was chronically on Plavix after stroke.  On admission it was held because of anemia.  Previous hospitalist discussed with family and started aspirin for risk reduction.  With continued drop in hemoglobin, I would stop aspirin as well.   Diarrheal illness with enterotoxigenic E. Coli Patient had significant diarrhea.   Diarrhea improved.    Hypertension Blood pressure is stable. Continue Coreg, amlodipine, hydralazine.  Type 2 diabetes mellitus A1c 6.5 on March 2024 Currently on Semglee 5 units daily and SSI/Accu-Cheks.  Glucose trend as below.   Recent Labs  Lab 10/28/22 2100 10/28/22 2129 10/29/22 0222 10/29/22 0735 10/29/22 1124  GLUCAP 44* 136* 124* 132* 155*   Urine retention w/ chronic Foley catheter in place   Continue the same.  Exchanged.   CKD stage IIIa BUN/creatinine stable and at about baseline. Recent Labs    10/15/22 0317 10/16/22 0303 10/20/22 1149 10/21/22 0521 10/22/22 0454 10/23/22 0518 10/24/22 0455 10/25/22 0515 10/26/22 0447 10/28/22 0617  BUN 25* 27* 38* 32* 34* 30* 21 17 19  25*  CREATININE 1.22 1.18 0.88 0.92 1.19 1.19 1.09 1.09 1.04 1.16  CO2 27 25 26 27 23 22  21* 22 24 26    Multiple pressure ulcers POA Barrier dressing and local care.    Goals of care   Code Status: Full Code    DVT prophylaxis:  SCDs Start: 10/20/22 1508   Antimicrobials: IV Zosyn Fluid: None Consultants: GI Family Communication:  Called and discussed with patient's daughter Ms. [name] on 8/25.  Status: Inpatient Level of care:  Progressive   Patient is from: [facility] nursing [facility] Needs to continue in-hospital care: Back to nursing facility Anticipated d/c to: Unclear yet.  Continues to drop hemoglobin   Diet:  Diet Order             Diet NPO time specified  Diet effective now                   Scheduled Meds:  amLODipine  5 mg Per Tube Daily   artificial tears  1 Application Both Eyes TID   carvedilol  6.25 mg Per Tube BID WC   Chlorhexidine Gluconate Cloth  6 each Topical Daily   feeding supplement (PROSource TF20)  60 mL Per Tube Daily   free water  150 mL Per Tube Q4H   hydrALAZINE  25 mg Per Tube Q8H   insulin aspart  0-5 Units Subcutaneous QHS   insulin aspart  0-9 Units Subcutaneous TID WC   insulin glargine-yfgn  5 Units Subcutaneous Daily   leptospermum manuka honey  1 Application Topical Daily   loratadine  10 mg Per Tube Daily   pantoprazole (PROTONIX) IV  40 mg Intravenous Q12H   pyridostigmine  30 mg Per Tube Q8H   rosuvastatin  5 mg Per Tube QHS   sertraline  25 mg Per Tube Daily    PRN meds: acetaminophen **OR** acetaminophen, ipratropium-albuterol, liver oil-zinc oxide, ondansetron **OR** ondansetron (ZOFRAN) IV   Infusions:   feeding supplement (JEVITY 1.5 CAL/FIBER) 1,000 mL (10/27/22 0600)    Antimicrobials: Anti-infectives (From admission, onward)    Start     Dose/Rate Route Frequency Ordered Stop   10/26/22 0000  amoxicillin-clavulanate (AUGMENTIN) 250-62.5 MG/5ML suspension        500 mg Oral 2 times daily 10/26/22 0844 10/31/22 2359   10/21/22 1000  ceFEPIme (MAXIPIME) 2 g in sodium chloride 0.9 % 100 mL IVPB  Status:  Discontinued        2 g 200 mL/hr over 30 Minutes Intravenous Every 8 hours 10/21/22 0804 10/21/22 0811   10/21/22 1000  piperacillin-tazobactam (ZOSYN) IVPB 3.375 g        3.375 g 12.5 mL/hr over 240 Minutes Intravenous Every 8 hours  10/21/22 0811 10/28/22 2328   10/20/22 2200  linezolid (ZYVOX) tablet 600 mg        600 mg Per Tube Every 12 hours 10/20/22 1459 10/22/22 2359       Nutritional status:  Body mass index is 26.7 kg/m.  Nutrition Problem: Increased nutrient needs Etiology: wound healing Signs/Symptoms: estimated needs     Objective: Vitals:   10/29/22 0547 10/29/22 1241  BP: 125/60 97/70  Pulse: 74 91  Resp:  20  Temp:  97.7 F (36.5 C)  SpO2: 99% 93%    Intake/Output Summary (Last 24 hours) at 10/29/2022 1351 Last data filed at 10/29/2022 0707 Gross per 24 hour  Intake 1380 ml  Output 400 ml  Net 980 ml   Filed Weights   10/26/22 0500 10/27/22 0548 10/29/22 0535  Weight: 93 kg 76 kg 82 kg   Weight change:  Body mass index is 26.7 kg/m.   Physical Exam: General exam: Elderly African-American male.  Not in physical distress. Skin: No rashes, lesions or ulcers. HEENT: Atraumatic, normocephalic, no obvious bleeding Lungs: Clear to auscultation bilaterally CVS: Regular rate and rhythm, no murmur GI/Abd soft, nontender, nondistended, bowel present CNS: Apparently he was in a conversation with nurse earlier.  Only says 'no to any question on exam  Psychiatry: Unable to assess given inability to have a conversation Extremities: No pedal edema, no calf tenderness  Data Review: I have personally reviewed the laboratory data and studies available.  F/u  labs ordered [employer] (From admission, onward)     Start     Ordered   10/30/22 0500  Basic metabolic panel  Daily,   R     Question:  Specimen collection method  Answer:  Lab=Lab collect   10/29/22 0831   10/30/22 0500  CBC with Differential/Platelet  Daily,   R     Question:  Specimen collection method  Answer:  Lab=Lab collect   10/29/22 0831   10/29/22 0832  CBC  ONCE - STAT,   STAT       Question:  Specimen collection method  Answer:  Lab=Lab collect   10/29/22 0831   10/29/22 0832  Basic metabolic panel  ONCE - STAT,    STAT       Question:  Specimen collection method  Answer:  Lab=Lab collect   10/29/22 0831            Total time spent in review of labs and imaging, patient evaluation, formulation of plan, documentation and communication with family: 45 minutes  Signed, Lorin Glass, MD [facility] 10/29/2022

## 2022-10-30 ENCOUNTER — Inpatient Hospital Stay (HOSPITAL_COMMUNITY)
Admission: EM | Admit: 2022-10-30 | Discharge: 2022-11-12 | Disposition: A | Discharge status: Skilled Nursing Facility | Payer: Medicare Other | Source: Skilled Nursing Facility | Attending: Internal Medicine | Admitting: Internal Medicine

## 2022-10-30 ENCOUNTER — Emergency Department (HOSPITAL_COMMUNITY): Payer: Medicare Other

## 2022-10-30 ENCOUNTER — Encounter (HOSPITAL_COMMUNITY): Payer: Self-pay

## 2022-10-30 ENCOUNTER — Other Ambulatory Visit: Payer: Self-pay

## 2022-10-30 DIAGNOSIS — Z7982 Long term (current) use of aspirin: Secondary | ICD-10-CM

## 2022-10-30 DIAGNOSIS — Z1152 Encounter for screening for COVID-19: Secondary | ICD-10-CM

## 2022-10-30 DIAGNOSIS — Z8719 Personal history of other diseases of the digestive system: Secondary | ICD-10-CM

## 2022-10-30 DIAGNOSIS — E11649 Type 2 diabetes mellitus with hypoglycemia without coma: Secondary | ICD-10-CM | POA: Diagnosis not present

## 2022-10-30 DIAGNOSIS — F17210 Nicotine dependence, cigarettes, uncomplicated: Secondary | ICD-10-CM | POA: Diagnosis present

## 2022-10-30 DIAGNOSIS — J9601 Acute respiratory failure with hypoxia: Secondary | ICD-10-CM | POA: Diagnosis present

## 2022-10-30 DIAGNOSIS — Z7902 Long term (current) use of antithrombotics/antiplatelets: Secondary | ICD-10-CM

## 2022-10-30 DIAGNOSIS — L89626 Pressure-induced deep tissue damage of left heel: Secondary | ICD-10-CM | POA: Diagnosis present

## 2022-10-30 DIAGNOSIS — D631 Anemia in chronic kidney disease: Secondary | ICD-10-CM | POA: Diagnosis present

## 2022-10-30 DIAGNOSIS — A419 Sepsis, unspecified organism: Secondary | ICD-10-CM | POA: Diagnosis not present

## 2022-10-30 DIAGNOSIS — J9811 Atelectasis: Secondary | ICD-10-CM | POA: Diagnosis present

## 2022-10-30 DIAGNOSIS — J690 Pneumonitis due to inhalation of food and vomit: Secondary | ICD-10-CM | POA: Diagnosis present

## 2022-10-30 DIAGNOSIS — E778 Other disorders of glycoprotein metabolism: Secondary | ICD-10-CM | POA: Diagnosis present

## 2022-10-30 DIAGNOSIS — H5462 Unqualified visual loss, left eye, normal vision right eye: Secondary | ICD-10-CM | POA: Diagnosis present

## 2022-10-30 DIAGNOSIS — G3183 Dementia with Lewy bodies: Secondary | ICD-10-CM | POA: Diagnosis present

## 2022-10-30 DIAGNOSIS — F0280 Dementia in other diseases classified elsewhere without behavioral disturbance: Secondary | ICD-10-CM | POA: Diagnosis present

## 2022-10-30 DIAGNOSIS — I5033 Acute on chronic diastolic (congestive) heart failure: Secondary | ICD-10-CM | POA: Diagnosis present

## 2022-10-30 DIAGNOSIS — Z8673 Personal history of transient ischemic attack (TIA), and cerebral infarction without residual deficits: Secondary | ICD-10-CM

## 2022-10-30 DIAGNOSIS — D509 Iron deficiency anemia, unspecified: Secondary | ICD-10-CM | POA: Diagnosis present

## 2022-10-30 DIAGNOSIS — Z888 Allergy status to other drugs, medicaments and biological substances status: Secondary | ICD-10-CM

## 2022-10-30 DIAGNOSIS — Z936 Other artificial openings of urinary tract status: Secondary | ICD-10-CM

## 2022-10-30 DIAGNOSIS — E1151 Type 2 diabetes mellitus with diabetic peripheral angiopathy without gangrene: Secondary | ICD-10-CM | POA: Diagnosis present

## 2022-10-30 DIAGNOSIS — T17908A Unspecified foreign body in respiratory tract, part unspecified causing other injury, initial encounter: Secondary | ICD-10-CM

## 2022-10-30 DIAGNOSIS — Z8249 Family history of ischemic heart disease and other diseases of the circulatory system: Secondary | ICD-10-CM

## 2022-10-30 DIAGNOSIS — Z66 Do not resuscitate: Secondary | ICD-10-CM | POA: Diagnosis present

## 2022-10-30 DIAGNOSIS — L89616 Pressure-induced deep tissue damage of right heel: Secondary | ICD-10-CM | POA: Diagnosis present

## 2022-10-30 DIAGNOSIS — I10 Essential (primary) hypertension: Secondary | ICD-10-CM | POA: Diagnosis present

## 2022-10-30 DIAGNOSIS — E785 Hyperlipidemia, unspecified: Secondary | ICD-10-CM | POA: Diagnosis present

## 2022-10-30 DIAGNOSIS — E1142 Type 2 diabetes mellitus with diabetic polyneuropathy: Secondary | ICD-10-CM | POA: Diagnosis present

## 2022-10-30 DIAGNOSIS — R6520 Severe sepsis without septic shock: Secondary | ICD-10-CM | POA: Diagnosis not present

## 2022-10-30 DIAGNOSIS — Y95 Nosocomial condition: Secondary | ICD-10-CM

## 2022-10-30 DIAGNOSIS — D649 Anemia, unspecified: Secondary | ICD-10-CM | POA: Diagnosis not present

## 2022-10-30 DIAGNOSIS — Z79899 Other long term (current) drug therapy: Secondary | ICD-10-CM

## 2022-10-30 DIAGNOSIS — E1169 Type 2 diabetes mellitus with other specified complication: Secondary | ICD-10-CM | POA: Diagnosis present

## 2022-10-30 DIAGNOSIS — Z794 Long term (current) use of insulin: Secondary | ICD-10-CM

## 2022-10-30 DIAGNOSIS — I110 Hypertensive heart disease with heart failure: Principal | ICD-10-CM | POA: Diagnosis present

## 2022-10-30 DIAGNOSIS — N179 Acute kidney failure, unspecified: Secondary | ICD-10-CM | POA: Diagnosis present

## 2022-10-30 DIAGNOSIS — R0603 Acute respiratory distress: Principal | ICD-10-CM

## 2022-10-30 DIAGNOSIS — Z7984 Long term (current) use of oral hypoglycemic drugs: Secondary | ICD-10-CM

## 2022-10-30 DIAGNOSIS — Z8619 Personal history of other infectious and parasitic diseases: Secondary | ICD-10-CM

## 2022-10-30 DIAGNOSIS — R339 Retention of urine, unspecified: Secondary | ICD-10-CM | POA: Diagnosis present

## 2022-10-30 DIAGNOSIS — I509 Heart failure, unspecified: Secondary | ICD-10-CM | POA: Diagnosis not present

## 2022-10-30 DIAGNOSIS — Z9841 Cataract extraction status, right eye: Secondary | ICD-10-CM

## 2022-10-30 DIAGNOSIS — R1310 Dysphagia, unspecified: Secondary | ICD-10-CM | POA: Diagnosis present

## 2022-10-30 DIAGNOSIS — Z833 Family history of diabetes mellitus: Secondary | ICD-10-CM

## 2022-10-30 DIAGNOSIS — Z7401 Bed confinement status: Secondary | ICD-10-CM

## 2022-10-30 DIAGNOSIS — Z9842 Cataract extraction status, left eye: Secondary | ICD-10-CM

## 2022-10-30 DIAGNOSIS — N1832 Chronic kidney disease, stage 3b: Secondary | ICD-10-CM | POA: Diagnosis present

## 2022-10-30 DIAGNOSIS — T17998A Other foreign object in respiratory tract, part unspecified causing other injury, initial encounter: Secondary | ICD-10-CM

## 2022-10-30 DIAGNOSIS — I69959 Hemiplegia and hemiparesis following unspecified cerebrovascular disease affecting unspecified side: Secondary | ICD-10-CM

## 2022-10-30 LAB — CBC WITH DIFFERENTIAL/PLATELET
Abs Immature Granulocytes: 0.06 10*3/uL (ref 0.00–0.07)
Abs Immature Granulocytes: 0.07 10*3/uL (ref 0.00–0.07)
Basophils Absolute: 0 10*3/uL (ref 0.0–0.1)
Basophils Absolute: 0 10*3/uL (ref 0.0–0.1)
Basophils Relative: 1 %
Basophils Relative: 1 %
Eosinophils Absolute: 0.3 10*3/uL (ref 0.0–0.5)
Eosinophils Absolute: 0.3 10*3/uL (ref 0.0–0.5)
Eosinophils Relative: 4 %
Eosinophils Relative: 4 %
HCT: 25.5 % — ABNORMAL LOW (ref 39.0–52.0)
HCT: 23 % — ABNORMAL LOW (ref 39.0–52.0)
Hemoglobin: 7.7 g/dL — ABNORMAL LOW (ref 13.0–17.0)
Hemoglobin: 7.1 g/dL — ABNORMAL LOW (ref 13.0–17.0)
Immature Granulocytes: 1 %
Immature Granulocytes: 1 %
Lymphocytes Relative: 16 %
Lymphocytes Relative: 13 %
Lymphs Abs: 1 10*3/uL (ref 0.7–4.0)
Lymphs Abs: 0.9 10*3/uL (ref 0.7–4.0)
MCH: 27.5 pg (ref 26.0–34.0)
MCH: 28 pg (ref 26.0–34.0)
MCHC: 30.2 g/dL (ref 30.0–36.0)
MCHC: 30.9 g/dL (ref 30.0–36.0)
MCV: 91.1 fL (ref 80.0–100.0)
MCV: 90.6 fL (ref 80.0–100.0)
Monocytes Absolute: 0.7 10*3/uL (ref 0.1–1.0)
Monocytes Absolute: 0.7 10*3/uL (ref 0.1–1.0)
Monocytes Relative: 11 %
Monocytes Relative: 10 %
Neutro Abs: 4.3 10*3/uL (ref 1.7–7.7)
Neutro Abs: 4.9 10*3/uL (ref 1.7–7.7)
Neutrophils Relative %: 67 %
Neutrophils Relative %: 71 %
Platelets: 245 10*3/uL (ref 150–400)
Platelets: 266 10*3/uL (ref 150–400)
RBC: 2.8 MIL/uL — ABNORMAL LOW (ref 4.22–5.81)
RBC: 2.54 MIL/uL — ABNORMAL LOW (ref 4.22–5.81)
RDW: 17.5 % — ABNORMAL HIGH (ref 11.5–15.5)
RDW: 17.2 % — ABNORMAL HIGH (ref 11.5–15.5)
WBC: 6.3 10*3/uL (ref 4.0–10.5)
WBC: 6.8 10*3/uL (ref 4.0–10.5)
nRBC: 0 % (ref 0.0–0.2)
nRBC: 0 % (ref 0.0–0.2)

## 2022-10-30 LAB — COMPREHENSIVE METABOLIC PANEL
ALT: 18 U/L (ref 0–44)
AST: 17 U/L (ref 15–41)
Albumin: 2.1 g/dL — ABNORMAL LOW (ref 3.5–5.0)
Alkaline Phosphatase: 74 U/L (ref 38–126)
Anion gap: 11 (ref 5–15)
BUN: 29 mg/dL — ABNORMAL HIGH (ref 8–23)
CO2: 23 mmol/L (ref 22–32)
Calcium: 8.1 mg/dL — ABNORMAL LOW (ref 8.9–10.3)
Chloride: 107 mmol/L (ref 98–111)
Creatinine, Ser: 1.16 mg/dL (ref 0.61–1.24)
GFR, Estimated: >60 mL/min (ref >60)
Glucose, Bld: 230 mg/dL — ABNORMAL HIGH (ref 70–99)
Potassium: 3.6 mmol/L (ref 3.5–5.1)
Sodium: 141 mmol/L (ref 135–145)
Total Bilirubin: 0.5 mg/dL (ref 0.3–1.2)
Total Protein: 6.2 g/dL — ABNORMAL LOW (ref 6.5–8.1)

## 2022-10-30 LAB — URINALYSIS, ROUTINE W REFLEX MICROSCOPIC
Bilirubin Urine: NEGATIVE
Glucose, UA: NEGATIVE mg/dL
Hgb urine dipstick: NEGATIVE
Ketones, ur: NEGATIVE mg/dL
Leukocytes,Ua: NEGATIVE
Nitrite: NEGATIVE
Protein, ur: 30 mg/dL — AB
Specific Gravity, Urine: 1.013 (ref 1.005–1.030)
pH: 5 (ref 5.0–8.0)

## 2022-10-30 LAB — BASIC METABOLIC PANEL
Anion gap: 13 (ref 5–15)
BUN: 23 mg/dL (ref 8–23)
CO2: 23 mmol/L (ref 22–32)
Calcium: 8.2 mg/dL — ABNORMAL LOW (ref 8.9–10.3)
Chloride: 103 mmol/L (ref 98–111)
Creatinine, Ser: 1 mg/dL (ref 0.61–1.24)
GFR, Estimated: >60 mL/min (ref >60)
Glucose, Bld: 230 mg/dL — ABNORMAL HIGH (ref 70–99)
Potassium: 4.4 mmol/L (ref 3.5–5.1)
Sodium: 139 mmol/L (ref 135–145)

## 2022-10-30 LAB — RESP PANEL BY RT-PCR (RSV, FLU A&B, COVID)  RVPGX2
Influenza A by PCR: NEGATIVE
Influenza B by PCR: NEGATIVE
Resp Syncytial Virus by PCR: NEGATIVE
SARS Coronavirus 2 by RT PCR: NEGATIVE

## 2022-10-30 LAB — I-STAT VENOUS BLOOD GAS, ED
Acid-Base Excess: 4 mmol/L — ABNORMAL HIGH (ref 0.0–2.0)
Bicarbonate: 27.3 mmol/L (ref 20.0–28.0)
Calcium, Ion: 1.04 mmol/L — ABNORMAL LOW (ref 1.15–1.40)
HCT: 22 % — ABNORMAL LOW (ref 39.0–52.0)
Hemoglobin: 7.5 g/dL — ABNORMAL LOW (ref 13.0–17.0)
O2 Saturation: 85 %
Potassium: 4 mmol/L (ref 3.5–5.1)
Sodium: 145 mmol/L (ref 135–145)
TCO2: 28 mmol/L (ref 22–32)
pCO2, Ven: 32.6 mmHg — ABNORMAL LOW (ref 44–60)
pH, Ven: 7.531 — ABNORMAL HIGH (ref 7.25–7.43)
pO2, Ven: 44 mmHg (ref 32–45)

## 2022-10-30 LAB — GLUCOSE, CAPILLARY
Glucose-Capillary: 230 mg/dL — ABNORMAL HIGH (ref 70–99)
Glucose-Capillary: 242 mg/dL — ABNORMAL HIGH (ref 70–99)

## 2022-10-30 LAB — CBG MONITORING, ED: Glucose-Capillary: 208 mg/dL — ABNORMAL HIGH (ref 70–99)

## 2022-10-30 LAB — BRAIN NATRIURETIC PEPTIDE: B Natriuretic Peptide: 511.8 pg/mL — ABNORMAL HIGH (ref 0.0–100.0)

## 2022-10-30 IMAGING — CT CT HEAD W/O CM
3 of 4 series · 13 of 47 positions shown, 15 images · non-contrast
Comparison: Prior brain MRI 10/27/2019.

CLINICAL DATA: Mental status change, unknown cause. Increased
altered mental status for 3 days.

EXAM:
CT HEAD WITHOUT CONTRAST
TECHNIQUE: Contiguous axial images were obtained from the base of the skull
through the vertex without intravenous contrast.

[Series 3: head without ax · axial · non-contrast · 0.40mm/px · z∈[-102,+19]mm · 7 of 35 slices shown, 9 images]
[im 5/35  brain]
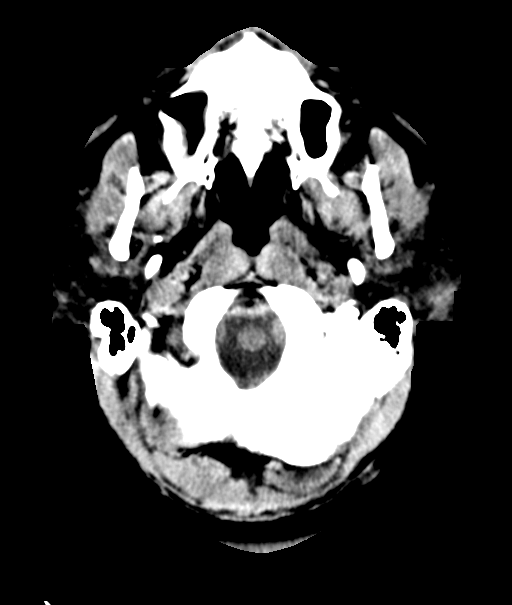
[im 5/35  bone]
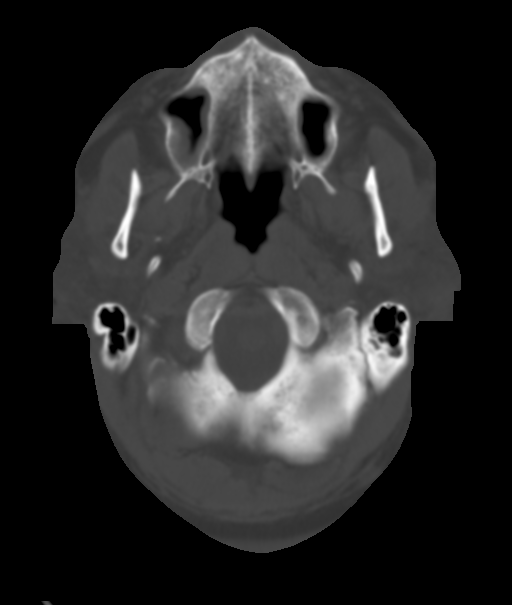
[im 9/35  brain]
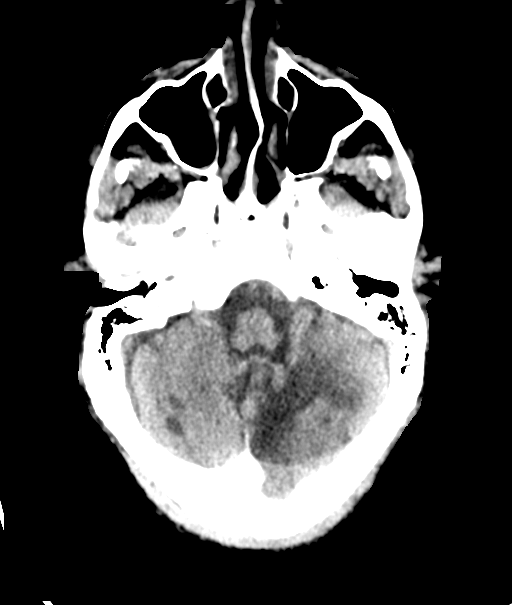
[im 13/35  brain]
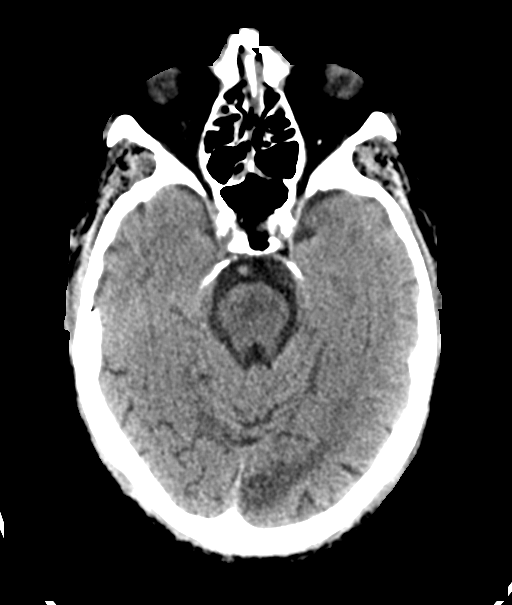
[im 18/35  brain]
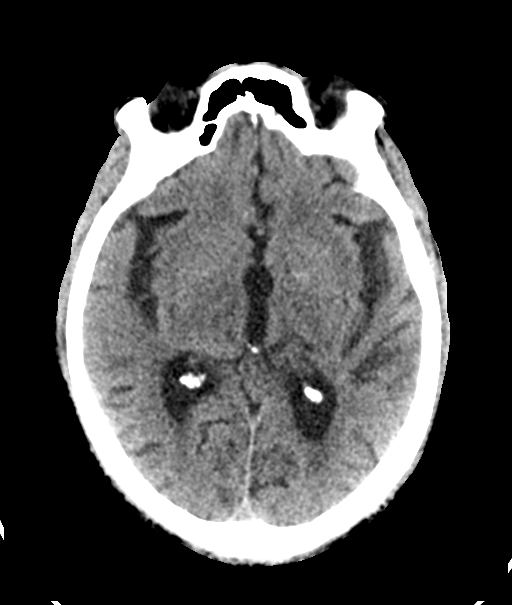
[im 22/35  brain]
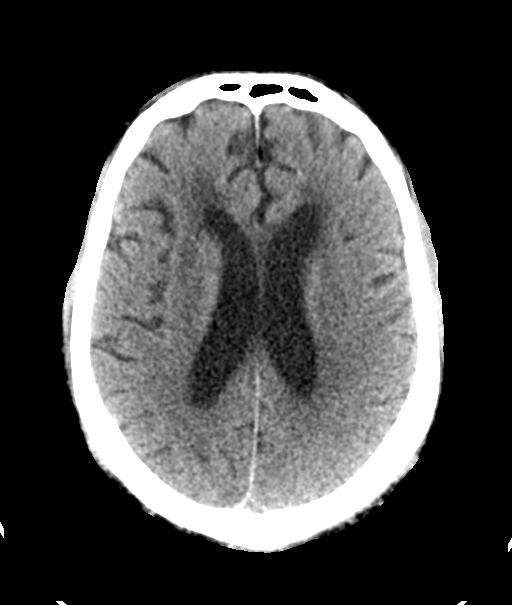
[im 22/35  bone]
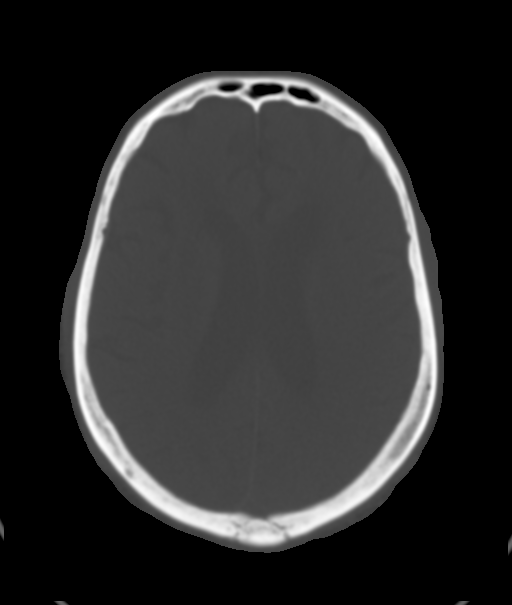
[im 26/35  brain]
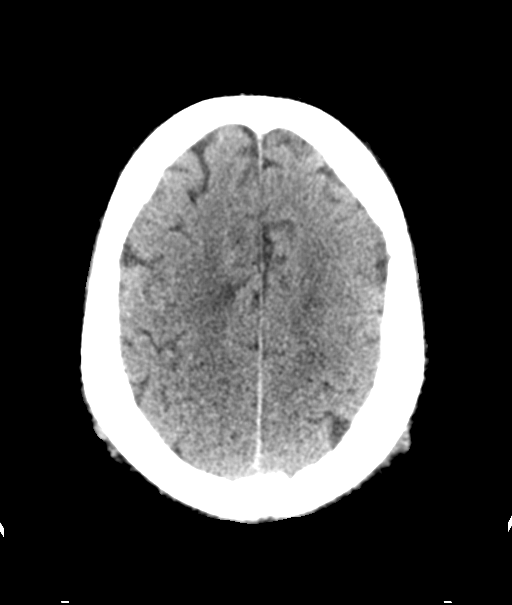
[im 30/35  brain]
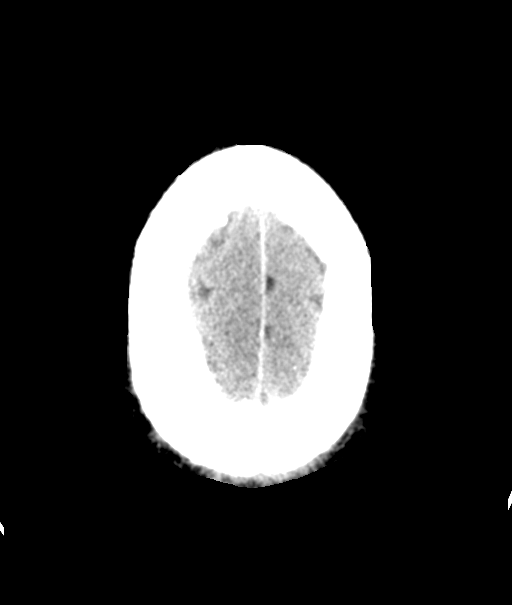

[Series 5: head without cor · coronal · non-contrast · 0.35mm/px · 3 of 85 slices shown]
[im 29/85  brain]
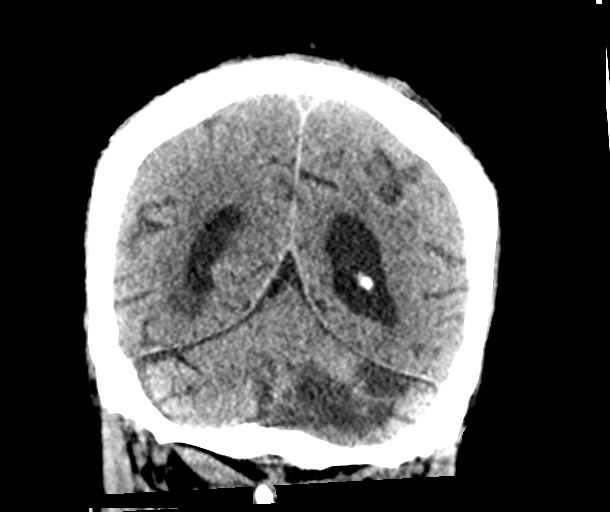
[im 38/85  brain]
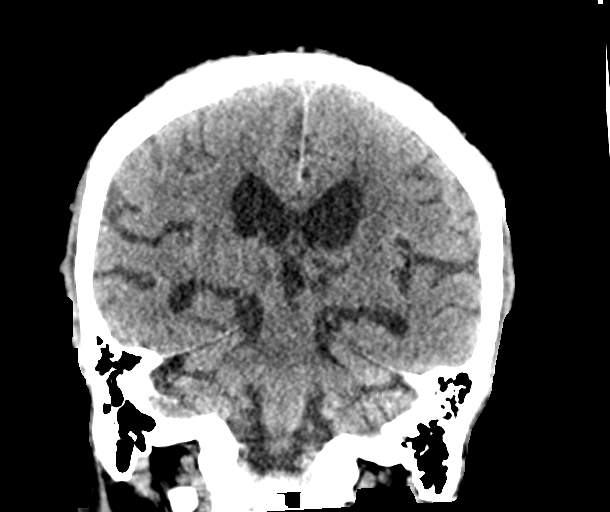
[im 47/85  brain]
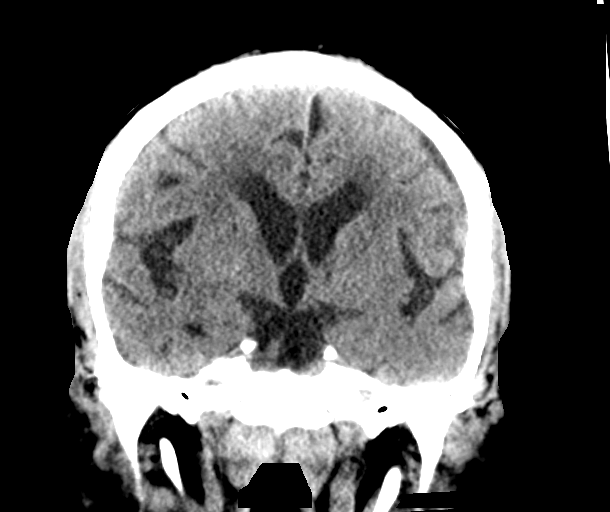

[Series 6: head without sag · sagittal · non-contrast · 0.36mm/px · 3 of 65 slices shown]
[im 22/65  brain]
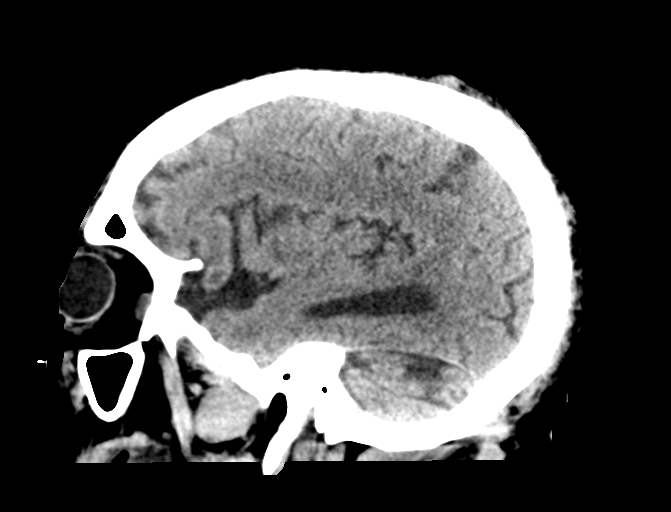
[im 33/65  brain]
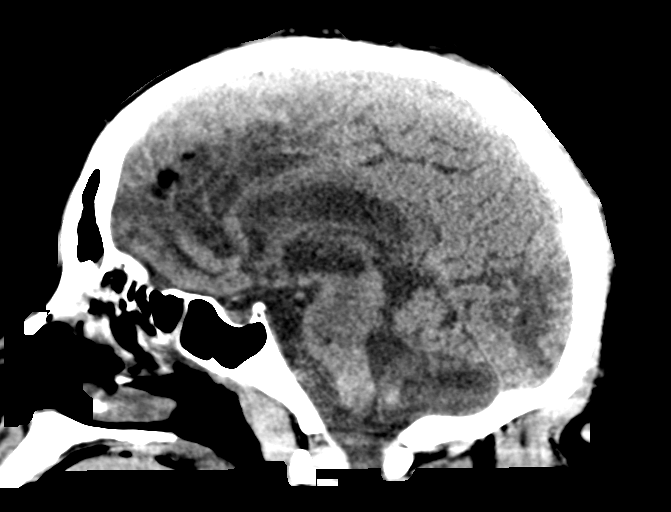
[im 43/65  brain]
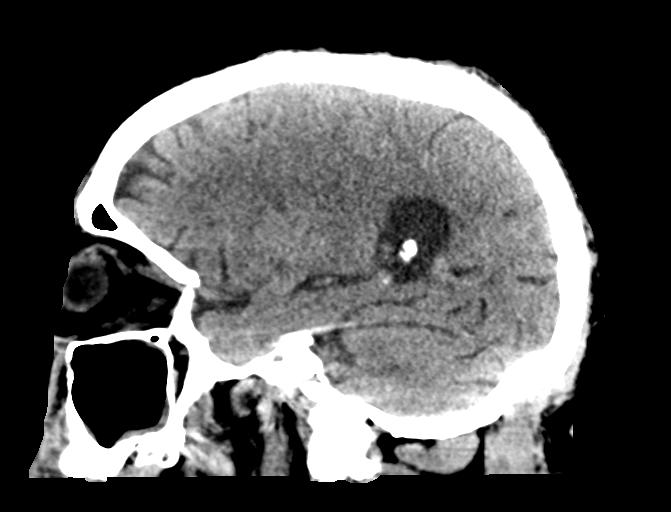

[13 of 47 positions shown; findings below may reference images not displayed]

FINDINGS: Brain:

Mild generalized parenchymal atrophy. Associated prominence of the
ventricles and sulci.

Redemonstrated chronic cortically based infarcts within the right
parietal lobe and bilateral occipital lobes.

Redemonstrated additional chronic infarcts within the right basal
ganglia, bilateral thalami, within the pons and within the left
greater than right cerebellar hemispheres.

Stable background moderate chronic small vessel ischemic disease
within the cerebral white matter and pons.

There is no acute intracranial hemorrhage.

No acute demarcated cortical infarct is identified

No extra-axial fluid collection.

No evidence of intracranial mass.

No midline shift.

Vascular: No hyperdense vessel.  Atherosclerotic calcifications.

Skull: Normal. Negative for fracture or focal suspicious osseous
lesion.

Sinuses/Orbits: Visualized orbits show no acute finding. Paranasal
sinus disease. Most notably, there is mild-to-moderate mucosal
thickening within the bilateral ethmoid air cells and right sphenoid
sinus and mild mucosal thickening within the bilateral maxillary
sinuses. Additionally, there are small volume frothy secretions
within the right sphenoid and maxillary sinuses.
IMPRESSION: No evidence of acute intracranial abnormality.

Generalized parenchymal atrophy with extensive chronic ischemia and
multiple old infarcts, unchanged from the brain MRI of 10/27/2019.

Paranasal sinus disease as described. Correlate for acute on chronic
sinusitis.

## 2022-10-30 MED ORDER — INSULIN ASPART 100 UNIT/ML IJ SOLN: 0.0000 [IU] | Freq: Three times a day (TID) | INTRAMUSCULAR | Status: AC

## 2022-10-30 MED ORDER — INSULIN ASPART 100 UNIT/ML IJ SOLN: 0.0000 [IU] | Freq: Every day | INTRAMUSCULAR | Status: DC

## 2022-10-30 MED ORDER — SUCRALFATE 1 GM/10ML PO SUSP
1.0000 g | Freq: Three times a day (TID) | ORAL | Status: DC
  Administered 2022-10-30: 1 g
  Filled 2022-10-30: qty 10

## 2022-10-30 MED ORDER — CARVEDILOL 6.25 MG PO TABS: 6.2500 mg | ORAL_TABLET | Freq: Two times a day (BID) | ORAL | Status: AC

## 2022-10-30 MED ORDER — FREE WATER: 150.0000 mL | Status: AC

## 2022-10-30 MED ORDER — IPRATROPIUM-ALBUTEROL 0.5-2.5 (3) MG/3ML IN SOLN: 3.0000 mL | RESPIRATORY_TRACT | Status: AC | PRN

## 2022-10-30 MED ORDER — PANTOPRAZOLE SODIUM 40 MG PO TBEC: 40.0000 mg | DELAYED_RELEASE_TABLET | Freq: Two times a day (BID) | ORAL | Status: DC

## 2022-10-30 MED ORDER — LANTUS SOLOSTAR 100 UNIT/ML ~~LOC~~ SOPN: 5.0000 [IU] | PEN_INJECTOR | Freq: Every day | SUBCUTANEOUS | Status: DC

## 2022-10-30 MED ORDER — JEVITY 1.5 CAL/FIBER PO LIQD: 1000.0000 mL | ORAL | Status: DC

## 2022-10-30 MED ORDER — AMLODIPINE BESYLATE 5 MG PO TABS: 5.0000 mg | ORAL_TABLET | Freq: Every day | ORAL | Status: AC

## 2022-10-30 MED ORDER — SUCRALFATE 1 GM/10ML PO SUSP: 1.0000 g | Freq: Three times a day (TID) | ORAL | Status: DC

## 2022-10-30 MED ORDER — OSMOLITE 1.5 CAL PO LIQD: 1000.0000 mL | ORAL | Status: AC

## 2022-10-30 MED ORDER — FUROSEMIDE 10 MG/ML IJ SOLN
40.0000 mg | Freq: Once | INTRAMUSCULAR | Status: AC
  Administered 2022-10-30: 40 mg via INTRAVENOUS
  Filled 2022-10-30: qty 4

## 2022-10-30 NOTE — ED Provider Notes (Signed)
[place] EMERGENCY DEPARTMENT AT [facility] Provider Note   CSN: [id number] Arrival date & time: 10/30/22  1723     History  Chief Complaint  Patient presents with   Respiratory Distress    DEVORIS WIESE is a 77 y.o. male.  HPI 77 year old male history of Lewy body dementia, type 2 diabetes, hypertension, hyperlipidemia, prior strokes, dysphagia with PEG tube, CKD, chronic anemia presenting for respiratory distress.  Primus, patient was recent discharged in the hospital here today.  He was taken to his nursing facility where he had respiratory distress but on their arrival he was hypoxic to the 70s and had minimal air movement.  Improved with nebulizer treatment.  They placed him on CPAP with improvement.  He is reportedly at his neurologic baseline, oriented to self but otherwise confused.  Per chart review, he was discharged today after 10-day admission for anemia requiring transfusion.  Patient with recent admission for aspiration pneumonia as well.     Home Medications Prior to Admission medications   Medication Sig Start Date End Date Taking? Authorizing Provider  aspirin EC 81 MG tablet Take 81 mg by mouth daily. Swallow whole.   Yes [provider]  carvedilol (COREG) 6.25 MG tablet Place 1 tablet (6.25 mg total) into feeding tube 2 (two) times daily with a meal. 10/30/22  Yes Dahal, Melina Schools, MD  clopidogrel (PLAVIX) 75 MG tablet Take 75 mg by mouth daily.   Yes [provider]  docusate sodium (COLACE) 100 MG capsule Take 100 mg by mouth 2 (two) times daily.   Yes [provider]  ferrous gluconate (FERGON) 324 MG tablet 324 mg at bedtime. Per tube   Yes [provider]  hydrALAZINE (APRESOLINE) 25 MG tablet Place 1 tablet (25 mg total) into feeding tube every 8 (eight) hours. 10/16/22  Yes Briant Cedar, MD  insulin aspart (NOVOLOG) 100 UNIT/ML injection Inject 0-9 Units into the skin 3 (three) times daily with  meals. Patient taking differently: Inject 0-12 Units into the skin See admin instructions. Per sliding scale: Administer every 6 hours if BS < 70 call NP/PA, 70-200 = 2 units, 251-300 = 4 units, 301-350 = 6 units, 351-400 = 8 units, 401-450 = 10 units, 451-600 = 12 units. If CBG more than 450 = 12 units, recheck in 2 hours. If still more than 350 call MD. 10/30/22  Yes Dahal, Melina Schools, MD  insulin glargine (LANTUS SOLOSTAR) 100 UNIT/ML Solostar Pen Inject 5 Units into the skin daily. Patient taking differently: Inject 13 Units into the skin daily. 10/30/22  Yes Dahal, Melina Schools, MD  ipratropium-albuterol (DUONEB) 0.5-2.5 (3) MG/3ML SOLN Take 3 mLs by nebulization every 4 (four) hours as needed. Patient taking differently: Take 3 mLs by nebulization every 4 (four) hours as needed (pneumonia). 10/30/22  Yes Dahal, Melina Schools, MD  linezolid (ZYVOX) 600 MG tablet Take 600 mg by mouth 2 (two) times daily.   Yes [provider]  losartan (COZAAR) 25 MG tablet Take 25 mg by mouth See admin instructions. 12.5mg  po every day prn sBP >155 or dBP >100   Yes [provider]  metformin (FORTAMET) 500 MG (OSM) 24 hr tablet Take 500 mg by mouth daily with breakfast.   Yes [provider]  Multiple Vitamin (MULTIVITAMIN) LIQD Place 15 mLs into feeding tube daily. 10/17/22  Yes Briant Cedar, MD  Nutritional Supplements (FEEDING SUPPLEMENT, OSMOLITE 1.5 CAL,) LIQD Place 1,000 mLs into feeding tube continuous. 50 ml/hr 10/30/22  Yes Dahal,  Melina Schools, MD  nystatin (MYCOSTATIN) 100000 UNIT/ML suspension Take 15 mLs by mouth in the morning, at noon, and at bedtime. Swish and spit   Yes [provider]  pantoprazole (PROTONIX) 40 MG tablet Take 1 tablet (40 mg total) by mouth 2 (two) times daily before a meal. 10/30/22  Yes Dahal, Melina Schools, MD  Polyethyl Glycol-Propyl Glycol (GENTEAL TEARS SEVERE DAY/NIGHT) 0.4-0.3 % GEL ophthalmic gel Place 1 Application into both eyes 3 (three) times daily.   Yes  [provider]  pyridostigmine (MESTINON) 60 MG tablet Take 0.5 tablets (30 mg total) by mouth every 8 (eight) hours. Patient taking differently: Place 30 mg into feeding tube every 8 (eight) hours. 10/30/19  Yes Kathlen Mody, MD  rosuvastatin (CRESTOR) 5 MG tablet Place 5 mg into feeding tube at bedtime.   Yes [provider]  tamsulosin (FLOMAX) 0.4 MG CAPS capsule Take 1 capsule (0.4 mg total) by mouth daily. Patient taking differently: 0.4 mg daily. Per tube 06/06/22  Yes Ghimire, Lyndel Safe, MD  Water For Irrigation, Sterile (FREE WATER) SOLN Place 150 mLs into feeding tube every 4 (four) hours. 10/30/22  Yes Dahal, Melina Schools, MD  amLODipine (NORVASC) 5 MG tablet Place 1 tablet (5 mg total) into feeding tube daily. 10/30/22   Dahal, Melina Schools, MD  insulin aspart (NOVOLOG) 100 UNIT/ML injection Inject 0-5 Units into the skin at bedtime. Patient not taking: Reported on 10/30/2022 10/30/22   Lorin Glass, MD  leptospermum manuka honey (MEDIHONEY) PSTE paste Apply 1 Application topically daily. Patient not taking: Reported on 10/20/2022 10/17/22   Briant Cedar, MD  sertraline (ZOLOFT) 25 MG tablet Place 25 mg into feeding tube daily.    [provider]  sucralfate (CARAFATE) 1 GM/10ML suspension Place 10 mLs (1 g total) into feeding tube 4 (four) times daily -  with meals and at bedtime. Patient not taking: Reported on 10/30/2022 10/30/22   Lorin Glass, MD      Allergies    Lipitor [atorvastatin], Statins, and Pravachol [pravastatin]    Review of Systems   Review of Systems  Unable to perform ROS: Dementia    Physical Exam Updated Vital Signs BP (!) 154/76   Pulse 81   Temp (!) 97.3 F (36.3 C) (Oral)   Resp 19   Ht 5\' 9"  (1.753 m)   Wt 82 kg   SpO2 100%   BMI 26.70 kg/m  Physical Exam Vitals and nursing note reviewed.  Constitutional:      General: He is in acute distress.     Appearance: He is well-developed. He is ill-appearing.  HENT:     Head:  Normocephalic and atraumatic.     Mouth/Throat:     Pharynx: Oropharynx is clear.  Eyes:     Extraocular Movements: Extraocular movements intact.     Conjunctiva/sclera: Conjunctivae normal.     Pupils: Pupils are equal, round, and reactive to light.  Cardiovascular:     Rate and Rhythm: Normal rate and regular rhythm.     Heart sounds: No murmur heard. Pulmonary:     Comments: Increased work of breathing, Rales bilaterally, no wheezing Abdominal:     Palpations: Abdomen is soft.     Tenderness: There is no abdominal tenderness.  Musculoskeletal:        General: No swelling.     Cervical back: Neck supple.     Right lower leg: No edema.     Left lower leg: No edema.  Skin:    General: Skin is warm  and dry.     Capillary Refill: Capillary refill takes less than 2 seconds.  Neurological:     Mental Status: He is alert. Mental status is at baseline.     Comments: Oriented to self.  Baseline weakness.  Able to follow some basic commands.  Psychiatric:        Mood and Affect: Mood normal.     ED Results / Procedures / Treatments   Labs (all labs ordered are listed, but only abnormal results are displayed) Labs Reviewed  COMPREHENSIVE METABOLIC PANEL - Abnormal; Notable for the following components:      Result Value   Glucose, Bld 230 (*)    BUN 29 (*)    Calcium 8.1 (*)    Total Protein 6.2 (*)    Albumin 2.1 (*)    All other components within normal limits  BRAIN NATRIURETIC PEPTIDE - Abnormal; Notable for the following components:   B Natriuretic Peptide 511.8 (*)    All other components within normal limits  CBC WITH DIFFERENTIAL/PLATELET - Abnormal; Notable for the following components:   RBC 2.54 (*)    Hemoglobin 7.1 (*)    HCT 23.0 (*)    RDW 17.2 (*)    All other components within normal limits  URINALYSIS, ROUTINE W REFLEX MICROSCOPIC - Abnormal; Notable for the following components:   Color, Urine AMBER (*)    APPearance HAZY (*)    Protein, ur 30 (*)     Bacteria, UA RARE (*)    All other components within normal limits  I-STAT VENOUS BLOOD GAS, ED - Abnormal; Notable for the following components:   pH, Ven 7.531 (*)    pCO2, Ven 32.6 (*)    Acid-Base Excess 4.0 (*)    Calcium, Ion 1.04 (*)    HCT 22.0 (*)    Hemoglobin 7.5 (*)    All other components within normal limits  CBG MONITORING, ED - Abnormal; Notable for the following components:   Glucose-Capillary 208 (*)    All other components within normal limits  RESP PANEL BY RT-PCR (RSV, FLU A&B, COVID)  RVPGX2  TYPE AND SCREEN    EKG EKG Interpretation Date/Time:  Tuesday October 30 2022 17:36:52 EDT Ventricular Rate:  84 PR Interval:  202 QRS Duration:  89 QT Interval:  406 QTC Calculation: 480 R Axis:   -14  Text Interpretation: Sinus rhythm Anterior infarct, old No significant change since last tracing Confirmed by Fulton Reek [phone]) on 10/30/2022 5:43:09 PM  Radiology DG Chest Port 1 View  Result Date: 10/30/2022 CLINICAL DATA:  Shortness of breath. EXAM: PORTABLE CHEST 1 VIEW COMPARISON:  October 27, 2022 FINDINGS: The heart size and mediastinal contours are within normal limits. Stable patchy moderate severity bilateral perihilar airspace opacities are seen, left greater than right. There is a small, partially layering right pleural effusion. A moderate size left pleural effusion is also noted. No pneumothorax is identified. The visualized skeletal structures are unremarkable. IMPRESSION: 1. Stable moderate severity bilateral perihilar airspace disease, left greater than right. 2. Bilateral pleural effusions, left greater than right. Electronically Signed   By: Aram Candela M.D.   On: 10/30/2022 19:39   CT Head Wo Contrast  Result Date: 10/30/2022 CLINICAL DATA:  Altered mental status EXAM: CT HEAD WITHOUT CONTRAST TECHNIQUE: Contiguous axial images were obtained from the base of the skull through the vertex without intravenous contrast. RADIATION DOSE REDUCTION:  This exam was performed according to the departmental dose-optimization program which includes automated exposure  control, adjustment of the mA and/or kV according to patient size and/or use of iterative reconstruction technique. COMPARISON:  10/05/2022 FINDINGS: Brain: There is no acute hemorrhage, mass or extra-axial collection. There is periventricular hypoattenuation compatible with chronic microvascular disease. There are old infarcts of the left occipital lobe and bilateral cerebellum with associated encephalomalacia. Mild generalized volume loss. Vascular: Atherosclerotic calcification of the internal carotid arteries at the skull base. No abnormal hyperdensity of the major intracranial arteries or dural venous sinuses. Skull: The visualized skull base, calvarium and extracranial soft tissues are normal. Sinuses/Orbits: No fluid levels or advanced mucosal thickening of the visualized paranasal sinuses. No mastoid or middle ear effusion. The orbits are normal. IMPRESSION: 1. No acute intracranial abnormality. 2. 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 Electronically Signed   By: Deatra Robinson M.D.   On: 10/30/2022 19:24    Procedures Procedures    Medications Ordered in ED Medications  furosemide (LASIX) injection 40 mg (40 mg Intravenous Given 10/30/22 1922)    ED Course/ Medical Decision Making/ A&P                                 Medical Decision Making Amount and/or Complexity of Data Reviewed Labs: ordered. Radiology: ordered.  Risk Prescription drug management. Decision regarding hospitalization.   Medical Decision Making:   ARYN HAFFEY is a 77 y.o. male who presented to the ED today with hypoxia and respiratory distress.  Arrives on CPAP with tachypnea and rales.  He initially had wheezing and diminished breath sounds which are improved after nebulizer with EMS.  Differential including sepsis, mucous plugging, pneumonia, COPD exacerbation, heart  failure exacerbation.  Low suspicion for ACS, PE at this time.   Patient placed on continuous vitals and telemetry monitoring while in ED which was reviewed periodically.  Reviewed and confirmed nursing documentation for past medical history, family history, social history.  Reassessment and Plan:   Chest x-ray reviewed concerning for bilateral pleural effusions as well as stable consolidation.  His workup is notable for newly elevated BNP, and his pulmonary exam is consistent with likely new onset heart failure and volume overload which is likely responsible for his respiratory compromise.  On reexamination he was able to come off of CPAP down to nasal cannula.  His daughter is at bedside reports he is at his baseline recently.  I did add on a CT head which did not show any acute abnormality.  He was given IV Lasix.  Workup otherwise reassuring.  Discussed with hospitalist team and admitted for new oxygen requirement.   Patient's presentation is most consistent with acute presentation with potential threat to life or bodily function.           Final Clinical Impression(s) / ED Diagnoses Final diagnoses:  Respiratory distress    Rx / DC Orders ED Discharge Orders     None         Laurence Spates, MD 10/30/22 2333

## 2022-10-30 NOTE — Plan of Care (Signed)

## 2022-10-30 NOTE — Plan of Care (Signed)
Problem: Education: Goal: Knowledge of General Education information will improve Description: Including pain rating scale, medication(s)/side effects and non-pharmacologic comfort measures 10/30/2022 1322 by Glynn Octave, RN Outcome: Adequate for Discharge 10/30/2022 1141 by Glynn Octave, RN Outcome: Progressing   Problem: Health Behavior/Discharge Planning: Goal: Ability to manage health-related needs will improve 10/30/2022 1322 by Glynn Octave, RN Outcome: Adequate for Discharge 10/30/2022 1141 by Glynn Octave, RN Outcome: Progressing   Problem: Clinical Measurements: Goal: Ability to maintain clinical measurements within normal limits will improve 10/30/2022 1322 by [name] D, RN Outcome: Adequate for Discharge 10/30/2022 1141 by Glynn Octave, RN Outcome: Progressing Goal: Will remain free from infection 10/30/2022 1322 by [name] D, RN Outcome: Adequate for Discharge 10/30/2022 1141 by Glynn Octave, RN Outcome: Progressing Goal: Diagnostic test results will improve 10/30/2022 1322 by Glynn Octave, RN Outcome: Adequate for Discharge 10/30/2022 1141 by [name] D, RN Outcome: Progressing Goal: Respiratory complications will improve 10/30/2022 1322 by Glynn Octave, RN Outcome: Adequate for Discharge 10/30/2022 1141 by Glynn Octave, RN Outcome: Progressing Goal: Cardiovascular complication will be avoided 10/30/2022 1322 by [name] D, RN Outcome: Adequate for Discharge 10/30/2022 1141 by Glynn Octave, RN Outcome: Progressing   Problem: Activity: Goal: Risk for activity intolerance will decrease 10/30/2022 1322 by [name] D, RN Outcome: Adequate for Discharge 10/30/2022 1141 by Glynn Octave, RN Outcome: Progressing   Problem: Nutrition: Goal: Adequate nutrition will be maintained 10/30/2022 1322 by [name] D, RN Outcome: Adequate for Discharge 10/30/2022 1141 by  [name] D, RN Outcome: Progressing   Problem: Coping: Goal: Level of anxiety will decrease 10/30/2022 1322 by [name] D, RN Outcome: Adequate for Discharge 10/30/2022 1141 by Glynn Octave, RN Outcome: Progressing   Problem: Elimination: Goal: Will not experience complications related to bowel motility 10/30/2022 1322 by Glynn Octave, RN Outcome: Adequate for Discharge 10/30/2022 1141 by Glynn Octave, RN Outcome: Progressing Goal: Will not experience complications related to urinary retention 10/30/2022 1322 by Glynn Octave, RN Outcome: Adequate for Discharge 10/30/2022 1141 by Glynn Octave, RN Outcome: Progressing   Problem: Pain Managment: Goal: General experience of comfort will improve 10/30/2022 1322 by Glynn Octave, RN Outcome: Adequate for Discharge 10/30/2022 1141 by Glynn Octave, RN Outcome: Progressing   Problem: Safety: Goal: Ability to remain free from injury will improve 10/30/2022 1322 by [name] D, RN Outcome: Adequate for Discharge 10/30/2022 1141 by Glynn Octave, RN Outcome: Progressing   Problem: Skin Integrity: Goal: Risk for impaired skin integrity will decrease 10/30/2022 1322 by [name] D, RN Outcome: Adequate for Discharge 10/30/2022 1141 by Glynn Octave, RN Outcome: Progressing   Problem: Education: Goal: Ability to describe self-care measures that may prevent or decrease complications (Diabetes Survival Skills Education) will improve 10/30/2022 1322 by Glynn Octave, RN Outcome: Adequate for Discharge 10/30/2022 1141 by Glynn Octave, RN Outcome: Progressing Goal: Individualized Educational Video(s) 10/30/2022 1322 by Glynn Octave, RN Outcome: Adequate for Discharge 10/30/2022 1141 by Glynn Octave, RN Outcome: Progressing   Problem: Coping: Goal: Ability to adjust to condition or change in health will improve 10/30/2022 1322 by [name] D, RN Outcome: Adequate for Discharge 10/30/2022 1141 by Glynn Octave, RN Outcome: Progressing   Problem: Fluid Volume: Goal: Ability to maintain a balanced intake and output will improve 10/30/2022 1322 by [name] D, RN Outcome: Adequate for Discharge 10/30/2022 1141 by Kemper Durie,  Silvio Pate, RN Outcome: Progressing   Problem: Health Behavior/Discharge Planning: Goal: Ability to identify and utilize available resources and services will improve 10/30/2022 1322 by Glynn Octave, RN Outcome: Adequate for Discharge 10/30/2022 1141 by Glynn Octave, RN Outcome: Progressing Goal: Ability to manage health-related needs will improve 10/30/2022 1322 by Glynn Octave, RN Outcome: Adequate for Discharge 10/30/2022 1141 by Glynn Octave, RN Outcome: Progressing   Problem: Metabolic: Goal: Ability to maintain appropriate glucose levels will improve 10/30/2022 1322 by [name] D, RN Outcome: Adequate for Discharge 10/30/2022 1141 by Glynn Octave, RN Outcome: Progressing   Problem: Nutritional: Goal: Maintenance of adequate nutrition will improve 10/30/2022 1322 by Glynn Octave, RN Outcome: Adequate for Discharge 10/30/2022 1141 by Glynn Octave, RN Outcome: Progressing Goal: Progress toward achieving an optimal weight will improve 10/30/2022 1322 by Glynn Octave, RN Outcome: Adequate for Discharge 10/30/2022 1141 by Glynn Octave, RN Outcome: Progressing   Problem: Skin Integrity: Goal: Risk for impaired skin integrity will decrease 10/30/2022 1322 by [name] D, RN Outcome: Adequate for Discharge 10/30/2022 1141 by Glynn Octave, RN Outcome: Progressing   Problem: Tissue Perfusion: Goal: Adequacy of tissue perfusion will improve 10/30/2022 1322 by Glynn Octave, RN Outcome: Adequate for Discharge 10/30/2022 1141 by Glynn Octave, RN Outcome: Progressing

## 2022-10-30 NOTE — Plan of Care (Signed)
?  Problem: Clinical Measurements: ?Goal: Will remain free from infection ?Outcome: Progressing ?  ?

## 2022-10-30 NOTE — Progress Notes (Signed)
Pt removed from bipap at this time to 3LPM [place] per MD request d/t VBG result.  RT will continue to monitor.

## 2022-10-30 NOTE — ED Triage Notes (Signed)
Pt present to ED from [facility] for respiratory distress. Pt arrived to ED with CPAP intact. Pt alert to name at this time, able to follow commands.

## 2022-10-30 NOTE — Discharge Summary (Signed)
Physician Discharge Summary  COCHISE WESP WNI:[id number] DOB: 1946/01/07 DOA: 10/20/2022  PCP: Crist Fat, MD  Admit date: 10/20/2022 Discharge date: 10/30/2022  Admitted From: [facility] nursing home Discharge disposition: Back to the same  Recommendations at discharge:  You have been started on Protonix twice daily, Carafate Premeal Plavix has been stopped    Brief narrative: Harry [name] is a 77 y.o. male with PMH significant for h/o Lewy body dementia, DM2, HTN, HLD, strokes, dysphagia s/p PEG tube in place, CKD 3B, chronic anemia, anasarca, peripheral neuropathy. Long-term resident at [facility] nursing home. This is his fourth hospitalization in last 4 months. Most recent hospitalization was 7/30 to 8/13 for aspiration pneumonia.  At the time he also was seen by GI for anemia and underwent 1 unit of PRBC transfusion.  Daughter declined conversation with palliative care at the time. 8/17, patient was sent from the ED for increased agitation. Workup showed hemoglobin low at 6.8, FOBT positive. Admitted to [facility] GI was consulted  Subjective: Patient was seen and examined this morning.  Lying down on bed.  Not in distress. Awake, only answers yes/no questions for me today.  Fluctuating mental status. Lying down on bed.  No report of black tarry stool last 24 hours Hemoglobin stable   Hospital course: Acute on chronic anemia Chronic GI blood loss Baseline hemoglobin close to 9.  Hemoglobin low at 6.8 on admission with FOBT positive stool.   GI was consulted.  Did not recommend endoscopic evaluation due to high scope intubation, aspiration and decompensation. Over the course of neck several days, patient continued to have dark tarry stool and drop in hemoglobin.  He was given a total of 3 units of PRBC transfusion.  Plavix has been stopped.  Aspirin was tried but stopped due to persistent drop in hemoglobin. At least for the last 24 hours, patient has not had any  dark tarry stool.  Hemoglobin stable. I have discussed with patient's daughter in multiple occasions. At this time we will plan to discharge the patient on optimal medical management with Protonix twice daily and Carafate as recommended by GI Recent Labs    06/03/22 1227 06/05/22 0518 09/07/22 1220 09/08/22 0715 10/04/22 0530 10/04/22 0531 10/04/22 1857 10/05/22 0500 10/27/22 0601 10/28/22 0617 10/28/22 1942 10/29/22 1758 10/30/22 0458  HGB 10.8*   < >  --    < >  --    < >  --    < > 7.5* 6.7* 7.4* 7.2* 7.7*  MCV 93.1   < >  --    < >  --    < >  --    < > 90.0 91.4 88.6 89.7 91.1  VITAMINB12 658  --  628  --   --   --   --   --   --   --   --   --   --   FOLATE  --   --  5.6*  --   --   --   --   --   --   --   --   --   --   FERRITIN  --   --  179  --   --   --  126  --   --   --   --   --   --   TIBC  --   --  200*  --   --   --  176*  --   --   --   --   --   --  IRON  --   --  7*  --   --   --  21*  --   --   --   --   --   --   RETICCTPCT  --   --   --   --  1.0  --   --   --   --   --   --   --   --    < > = values in this interval not displayed.   Acute hypoxic respiratory failure Recurrent aspiration pneumonia Most recent hospitalized was for aspiration pneumonia and, treatment with Zyvox. Initial chest x-ray this admission showed improvement in opacities but CT angio chest showed extensive intraluminal debris's within the tracheobronchial tree with near complete opacification of the left bronchial tree and scattered airway impaction within the right lower lobe, bibasilar pulmonary infiltrates -findings suggestive of aspiration. Completed course of empiric antibiotics. High risk of recurrent aspiration given significant dysphagia Continue aspiration precautions Continue supplemental oxygen. Recent Labs  Lab 10/25/22 0515 10/26/22 0447 10/27/22 0601 10/28/22 0617 10/28/22 1942 10/29/22 0547 10/29/22 1758 10/30/22 0458  WBC 6.4 6.5 6.4 6.3 6.6  --  4.9 6.3   PROCALCITON 0.28 0.23 0.15 0.20  --  <0.10  --   --    Chronic dysphagia s/p PEG tube Has PEG tube that was placed at [facility] on 7/15. Currently tolerating tube feeding at 50 mL/h.  Unable to tolerate any oral intake  Acute metabolic encephalopathy Vascular dementia Lewy body dementia Has dementia with behavioral symptoms at baseline.  Primarily sent from nursing facility for agitation. Acute worsening was likely due to infection as well as hypoglycemia. Mental status waxing and waning.  Remains at baseline currently. Recent head CT/ MRI of the brain on last admission no acute finding. EEG on last admission with mild diffuse encephalopathy nonspecific etiology.  Continue to monitor  H/o recurrent stroke Last stroke 5 months ago.  Most recent CT head from 8/2 without any acute intracranial abnormality but showed chronic infarct within the left cerebellar hemisphere left occipital lobe, right cerebellar hemisphere, chronic bilateral basal ganglia and thalamic lacunar infarcts and moderate chronic microvascular ischemic changes and brain atrophy. Was chronically on Plavix after stroke.  On admission it was held because of anemia.  Previous hospitalist discussed with family and started aspirin for risk reduction.  However with persistent drop in hemoglobin decision was made with family to stop aspirin as well.   Diarrheal illness with enterotoxigenic E. Coli Diarrhea has now improved.  Hypertension Blood pressure is stable. Continue Coreg, amlodipine, hydralazine.  Type 2 diabetes mellitus A1c 6.5 on March 2024 Currently on Semglee 5 units daily and SSI/Accu-Cheks.  Glucose trend as below.   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  Exchanged. Continue Flomax   CKD stage IIIa BUN/creatinine stable and at about  baseline. Recent Labs    10/16/22 0303 10/20/22 1149 10/21/22 0521 10/22/22 0454 10/23/22 0518 10/24/22 0455 10/25/22 0515 10/26/22 0447 10/28/22 0617 10/30/22 0458  BUN 27* 38* 32* 34* 30* 21 17 19  25* 23  CREATININE 1.18 0.88 0.92 1.19 1.19 1.09 1.09 1.04 1.16 1.00  CO2 25 26 27 23 22  21* 22 24 26 23    Multiple pressure ulcers POA Barrier dressing and local care.    Goals of care   Code  Status: Full Code   Wounds:  - Pressure Injury 10/02/22 Heel Right Deep Tissue Pressure Injury - Purple or maroon localized area of discolored intact skin or blood-filled blister due to damage of underlying soft tissue from pressure and/or shear. deep purple; skin intact; 4cmx2cm (Active)  Date First Assessed/Time First Assessed: 10/02/22 0330   Location: Heel  Location Orientation: Right  Staging: Deep Tissue Pressure Injury - Purple or maroon localized area of discolored intact skin or blood-filled blister due to damage of underlying ...    Assessments 10/02/2022  4:48 AM 10/28/2022  8:33 PM  Dressing -- Clean, Dry, Intact  Site / Wound Assessment -- Dressing in place / Unable to assess  Wound Length (cm) 4 cm --  Wound Width (cm) 2 cm --  Wound Surface Area (cm^2) 8 cm^2 --     No associated orders.     Pressure Injury 10/02/22 Heel Right Deep Tissue Pressure Injury - Purple or maroon localized area of discolored intact skin or blood-filled blister due to damage of underlying soft tissue from pressure and/or shear. deep purple; skin intact; 3cmx6cm (Active)  Date First Assessed/Time First Assessed: 10/02/22 0330   Location: Heel  Location Orientation: Right  Staging: Deep Tissue Pressure Injury - Purple or maroon localized area of discolored intact skin or blood-filled blister due to damage of underlying ...    Assessments 10/02/2022  4:49 AM 10/28/2022  8:33 PM  Dressing -- Clean, Dry, Intact  Site / Wound Assessment -- Dressing in place / Unable to assess  Wound Length (cm) 3 cm --   Wound Width (cm) 6 cm --  Wound Surface Area (cm^2) 18 cm^2 --     No associated orders.     Pressure Injury 10/02/22 Sacrum Mid Unstageable - Full thickness tissue loss in which the base of the injury is covered by slough (yellow, tan, gray, green or Mancinelli) and/or eschar (tan, Logan or black) in the wound bed. 4cmx2cm (Active)  Date First Assessed/Time First Assessed: 10/02/22 0330   Location: Sacrum  Location Orientation: Mid  Staging: (c) Unstageable - Full thickness tissue loss in which the base of the injury is covered by slough (yellow, tan, gray, green or [place]) and/or...    Assessments 10/02/2022  5:09 AM 10/28/2022  8:33 PM  Dressing -- Intact  Site / Wound Assessment -- Dressing in place / Unable to assess  Wound Length (cm) 4 cm --  Wound Width (cm) 2 cm --  Wound Surface Area (cm^2) 8 cm^2 --     No associated orders.     Wound / Incision (Open or Dehisced) 10/08/22 Other (Comment) Abdomen Mid slought around G tube site (Active)  Date First Assessed: 10/08/22   Wound Type: Other (Comment)  Location: Abdomen  Location Orientation: Mid  Wound Description (Comments): slought around G tube site  Present on Admission: Yes    Assessments 10/08/2022  8:00 AM 10/28/2022  8:33 PM  Dressing Status -- Clean, Dry, Intact  Site / Wound Assessment -- Dressing in place / Unable to assess  % Wound base Yellow/Fibrinous Exudate 100% --  Wound Length (cm) 1 cm --  Wound Width (cm) 2 cm --  Wound Surface Area (cm^2) 2 cm^2 --     No associated orders.    Discharge Exam:   Vitals:   10/29/22 1241 10/29/22 2225 10/30/22 0639 10/30/22 0839  BP: 97/70 (!) 158/90 (!) 153/72   Pulse: 91 92 94   Resp: 20 (!) 22 17  Temp: 97.7 F (36.5 C) 98.8 F (37.1 C) 97.6 F (36.4 C)   TempSrc: Oral Oral Axillary   SpO2: 93% 93% 91% 93%  Weight:      Height:        Body mass index is 26.7 kg/m.   General exam: Elderly African-American male.  Not in physical distress. Skin: No rashes, lesions  or ulcers. HEENT: Atraumatic, normocephalic, no obvious bleeding Lungs: Clear to auscultation bilaterally CVS: Regular rate and rhythm, no murmur GI/Abd soft, nontender, nondistended, bowel present [id number], only answers yes/no questions for me today.  Fluctuating mental status. Psychiatry: Unable to assess given inability to have a conversation Extremities: No pedal edema, no calf tenderness  Follow ups:    Contact information for follow-up providers     Crist Fat, MD Follow up.   Specialty: Internal Medicine Contact information: [address] [place] [address] [phone]              Contact information for after-discharge care     Destination     [facility], [facility] Preferred SNF .   Service: Skilled Nursing Contact information: [address] [place] [address] [phone]                     Discharge Instructions:   Discharge Instructions     Call MD for:  difficulty breathing, headache or visual disturbances   Complete by: As directed    Call MD for:  extreme fatigue   Complete by: As directed    Call MD for:  hives   Complete by: As directed    Call MD for:  persistant dizziness or light-headedness   Complete by: As directed    Call MD for:  persistant nausea and vomiting   Complete by: As directed    Call MD for:  severe uncontrolled pain   Complete by: As directed    Call MD for:  temperature >100.4   Complete by: As directed    Diet general   Complete by: As directed    Tube feeding   Discharge instructions   Complete by: As directed    Recommendations at discharge:   You have been started on Protonix twice daily, Carafate Premeal  Plavix has been stopped  General discharge instructions: Follow with Primary MD Leonia Reader, Barbara Cower, MD in 7 days  Please request your PCP  to go over your hospital tests, procedures, radiology results at the follow up. Please get your medicines  reviewed and adjusted.  Your PCP may decide to repeat certain labs or tests as needed. Do not drive, operate heavy machinery, perform activities at heights, swimming or participation in water activities or provide baby sitting services if your were admitted for syncope or siezures until you have seen by Primary MD or a Neurologist and advised to do so again. [place] Controlled Substance Reporting System database was reviewed. Do not drive, operate heavy machinery, perform activities at heights, swim, participate in water activities or provide baby-sitting services while on medications for pain, sleep and mood until your outpatient physician has reevaluated you and advised to do so again.  You are strongly recommended to comply with the dose, frequency and duration of prescribed medications. Activity: As tolerated with Full fall precautions use walker/cane & assistance as needed Avoid using any recreational substances like cigarette, tobacco, alcohol, or non-prescribed drug. If you experience worsening of your admission symptoms, develop shortness of breath, life threatening  emergency, suicidal or homicidal thoughts you must seek medical attention immediately by calling 911 or calling your MD immediately  if symptoms less severe. You must read complete instructions/literature along with all the possible adverse reactions/side effects for all the medicines you take and that have been prescribed to you. Take any new medicine only after you have completely understood and accepted all the possible adverse reactions/side effects.  Wear Seat belts while driving. You were cared for by a hospitalist during your hospital stay. If you have any questions about your discharge medications or the care you received while you were in the hospital after you are discharged, you can call the unit and ask to speak with the hospitalist or the covering physician. Once you are discharged, your primary care physician will  handle any further medical issues. Please note that NO REFILLS for any discharge medications will be authorized once you are discharged, as it is imperative that you return to your primary care physician (or establish a relationship with a primary care physician if you do not have one).   Discharge wound care:   Complete by: As directed    Wound care  Daily      Comments: Silicone foam dressings to the heels, monitor right heel for acute changes in DTPI (medially), change every 3 days. ASSESS UNDER dressings each shift for any acute changes in the wounds. Wound care  Daily      Comments: Apply single layer of xeroform gauze to the wounds at the PEG site, change daily. Wound care  Daily      Comments: Cleanse sacral wound with saline, pat dry Apply 1/4" thick layer of leptospermum honey to sacral wound bed, top with dry dressing, change daily.   Discharge wound care:   Complete by: As directed    Increase activity slowly   Complete by: As directed    Increase activity slowly   Complete by: As directed        Discharge Medications:   Allergies as of 10/30/2022       Reactions   Lipitor [atorvastatin] Other (See Comments)   Myalgias   Statins Other (See Comments)   Myalgias  Pt tolerating rosuvastatin 5mg  QD   Pravachol [pravastatin] Rash        Medication List     STOP taking these medications    clopidogrel 75 MG tablet Commonly known as: PLAVIX   insulin lispro 100 UNIT/ML injection Commonly known as: HUMALOG   linezolid 600 MG tablet Commonly known as: ZYVOX   magic mouthwash Soln       TAKE these medications    amLODipine 5 MG tablet Commonly known as: NORVASC Place 1 tablet (5 mg total) into feeding tube daily. What changed:  medication strength how much to take   carvedilol 6.25 MG tablet Commonly known as: COREG Place 1 tablet (6.25 mg total) into feeding tube 2 (two) times daily with a meal. What changed:  medication strength how much to  take how to take this   CREAM BASE EX Apply 1 Application topically daily. Apply minerin cream to feet   feeding supplement (OSMOLITE 1.5 CAL) Liqd Place 1,000 mLs into feeding tube continuous. 50 ml/hr What changed:  how much to take additional instructions Another medication with the same name was removed. Continue taking this medication, and follow the directions you see here.   ferrous gluconate 324 MG tablet Commonly known as: FERGON 324 mg at bedtime. Per tube   free water Soln Place  150 mLs into feeding tube every 4 (four) hours. What changed: how much to take   GenTeal Tears Severe Day/Night 0.4-0.3 % Gel ophthalmic gel Generic drug: Polyethyl Glycol-Propyl Glycol Place 1 Application into both eyes 3 (three) times daily.   hydrALAZINE 25 MG tablet Commonly known as: APRESOLINE Place 1 tablet (25 mg total) into feeding tube every 8 (eight) hours.   insulin aspart 100 UNIT/ML injection Commonly known as: novoLOG Inject 0-5 Units into the skin at bedtime.   insulin aspart 100 UNIT/ML injection Commonly known as: novoLOG Inject 0-9 Units into the skin 3 (three) times daily with meals.   ipratropium-albuterol 0.5-2.5 (3) MG/3ML Soln Commonly known as: DUONEB Take 3 mLs by nebulization every 4 (four) hours as needed.   Lantus SoloStar 100 UNIT/ML Solostar Pen Generic drug: insulin glargine Inject 5 Units into the skin daily. What changed: how much to take   leptospermum manuka honey Pste paste Apply 1 Application topically daily.   multivitamin Liqd Place 15 mLs into feeding tube daily.   nystatin 100000 UNIT/ML suspension Commonly known as: MYCOSTATIN Take 15 mLs by mouth in the morning, at noon, and at bedtime. Swish and spit   pantoprazole 40 MG tablet Commonly known as: Protonix Take 1 tablet (40 mg total) by mouth 2 (two) times daily before a meal.   pyridostigmine 60 MG tablet Commonly known as: MESTINON Take 0.5 tablets (30 mg total) by mouth  every 8 (eight) hours. What changed: how to take this   rosuvastatin 5 MG tablet Commonly known as: CRESTOR Place 5 mg into feeding tube at bedtime.   sertraline 25 MG tablet Commonly known as: ZOLOFT Place 25 mg into feeding tube daily.   sucralfate 1 GM/10ML suspension Commonly known as: CARAFATE Place 10 mLs (1 g total) into feeding tube 4 (four) times daily -  with meals and at bedtime.   tamsulosin 0.4 MG Caps capsule Commonly known as: FLOMAX Take 1 capsule (0.4 mg total) by mouth daily. What changed:  how to take this additional instructions               Discharge Care Instructions  (From admission, onward)           Start     Ordered   10/30/22 0000  Discharge wound care:        10/30/22 0958   10/26/22 0000  Discharge wound care:       Comments: Wound care  Daily      Comments: Silicone foam dressings to the heels, monitor right heel for acute changes in DTPI (medially), change every 3 days. ASSESS UNDER dressings each shift for any acute changes in the wounds. Wound care  Daily      Comments: Apply single layer of xeroform gauze to the wounds at the PEG site, change daily. Wound care  Daily      Comments: Cleanse sacral wound with saline, pat dry Apply 1/4" thick layer of leptospermum honey to sacral wound bed, top with dry dressing, change daily.   10/26/22 0844             The results of significant diagnostics from this hospitalization (including imaging, microbiology, ancillary and laboratory) are listed below for reference.    Procedures and Diagnostic Studies:   CT Angio Chest Pulmonary Embolism (PE) W or WO Contrast  Result Date: 10/21/2022 CLINICAL DATA:  Pulmonary embolism (PE), high prob; Abdominal pain, acute, nonlocalized EXAM: CT ANGIOGRAPHY CHEST CT ABDOMEN AND PELVIS WITH CONTRAST TECHNIQUE:  Multidetector CT imaging of the chest was performed using the standard protocol during bolus administration of intravenous contrast.  Multiplanar CT image reconstructions and MIPs were obtained to evaluate the vascular anatomy. Multidetector CT imaging of the abdomen and pelvis was performed using the standard protocol during bolus administration of intravenous contrast. RADIATION DOSE REDUCTION: This exam was performed according to the departmental dose-optimization program which includes automated exposure control, adjustment of the mA and/or kV according to patient size and/or use of iterative reconstruction technique. CONTRAST:  OMNIPAQUE IOHEXOL 350 MG/ML SOLN COMPARISON:  CT chest 09/06/2022, CT abdomen pelvis 10/08/2022. FINDINGS: CTA CHEST FINDINGS Cardiovascular: There is adequate opacification of the pulmonary arterial tree. No intraluminal filling defect identified to suggest acute pulmonary embolism. Central pulmonary arteries are of normal caliber. Moderate coronary artery calcification. Global cardiac size within normal limits. No pericardial effusion. Mild atherosclerotic calcification within the thoracic aorta. No aortic aneurysm. Mediastinum/Nodes: Visualized thyroid is unremarkable. Stable shotty right paratracheal and bilateral hilar adenopathy, nonspecific, possibly reactive in nature. The esophagus is unremarkable. Lungs/Pleura: There is extensive and luminal debris within the tracheobronchial tree with near complete impaction of the left bronchial tree and scattered airway impaction within the right lower lobe. Bibasilar pulmonary infiltrates are present. Together, the findings suggest changes of aspiration. Small bilateral pleural effusions are present, new since prior examination. No pneumothorax. Musculoskeletal: No acute bone abnormality. No lytic or blastic bone lesion. Review of the MIP images confirms the above findings. CT ABDOMEN and PELVIS FINDINGS Hepatobiliary: Cholelithiasis without pericholecystic inflammatory change. Tiny cyst within the right hepatic dome. Liver otherwise unremarkable. No intra or  extrahepatic biliary ductal dilation. Pancreas: Unremarkable. No pancreatic ductal dilatation or surrounding inflammatory changes. Spleen: Normal in size without focal abnormality. Adrenals/Urinary Tract: The adrenal glands are unremarkable. The kidneys are normal in size and position. Simple cortical cyst noted within the lower pole right kidney for which no follow-up imaging is recommended. The kidneys are otherwise unremarkable. Foley catheter balloon is seen within a largely decompressed bladder lumen. There is circumferential marked bladder wall thickening suggesting changes of chronic bladder outlet obstruction, similar to prior examination. Stomach/Bowel: Previously noted button type PEG tube has been exchanged for a balloon retention gastrostomy catheter which is in expected position within the gastric lumen. Infiltration along the tract of the gastrostomy catheter appears improved since prior examination related to leakage or inflammation along the tract. Moderate fluid is again seen within the subcutaneous soft tissues of the left lateral abdominal wall as well as within the fascial planes of the lateral abdominal wall musculature, increased since prior examination. Stomach, small bowel, and large bowel are otherwise unremarkable. Appendix normal. No free intraperitoneal gas or fluid. Vascular/Lymphatic: Extensive atherosclerotic calcification within the aortoiliac vasculature. No aortic aneurysm. No pathologic adenopathy within the abdomen and pelvis. Reproductive: Marked prostatic hypertrophy. Other: Mild diffuse subcutaneous edema again noted within the gluteal soft tissues and visualized lower extremities bilaterally. Musculoskeletal: No acute bone abnormality. No lytic or blastic bone lesion. Review of the MIP images confirms the above findings. IMPRESSION: 1. No pulmonary embolism. 2. Extensive endoluminal debris within the tracheobronchial tree with near complete impaction of the left bronchial tree  and scattered airway impaction within the right lower lobe. Bibasilar pulmonary infiltrates. Together, the findings suggest changes of aspiration. 3. Small bilateral pleural effusions, new since prior examination but improved since CT examination of the abdomen pelvis of 10/08/2022. 4. Moderate coronary artery calcification. 5. Cholelithiasis. 6. Marked prostatic hypertrophy. Changes of chronic bladder outlet obstruction with  Foley decompression of the bladder. 7. Interval gastrostomy exchange. Mild subcutaneous infiltration along the gastrostomy catheter tract and moderate fluid within the subcutaneous soft tissues of the left lateral abdominal wall as well as within the fascial planes of the lateral abdominal wall musculature may reflect changes of leakage along an immature tract and appears increased since prior examination. Aortic Atherosclerosis (ICD10-I70.0). Electronically Signed   By: Helyn Numbers M.D.   On: 10/21/2022 02:56   CT ABDOMEN PELVIS W CONTRAST  Result Date: 10/21/2022 CLINICAL DATA:  Pulmonary embolism (PE), high prob; Abdominal pain, acute, nonlocalized EXAM: CT ANGIOGRAPHY CHEST CT ABDOMEN AND PELVIS WITH CONTRAST TECHNIQUE: Multidetector CT imaging of the chest was performed using the standard protocol during bolus administration of intravenous contrast. Multiplanar CT image reconstructions and MIPs were obtained to evaluate the vascular anatomy. Multidetector CT imaging of the abdomen and pelvis was performed using the standard protocol during bolus administration of intravenous contrast. RADIATION DOSE REDUCTION: This exam was performed according to the departmental dose-optimization program which includes automated exposure control, adjustment of the mA and/or kV according to patient size and/or use of iterative reconstruction technique. CONTRAST:  OMNIPAQUE IOHEXOL 350 MG/ML SOLN COMPARISON:  CT chest 09/06/2022, CT abdomen pelvis 10/08/2022. FINDINGS: CTA CHEST FINDINGS  Cardiovascular: There is adequate opacification of the pulmonary arterial tree. No intraluminal filling defect identified to suggest acute pulmonary embolism. Central pulmonary arteries are of normal caliber. Moderate coronary artery calcification. Global cardiac size within normal limits. No pericardial effusion. Mild atherosclerotic calcification within the thoracic aorta. No aortic aneurysm. Mediastinum/Nodes: Visualized thyroid is unremarkable. Stable shotty right paratracheal and bilateral hilar adenopathy, nonspecific, possibly reactive in nature. The esophagus is unremarkable. Lungs/Pleura: There is extensive and luminal debris within the tracheobronchial tree with near complete impaction of the left bronchial tree and scattered airway impaction within the right lower lobe. Bibasilar pulmonary infiltrates are present. Together, the findings suggest changes of aspiration. Small bilateral pleural effusions are present, new since prior examination. No pneumothorax. Musculoskeletal: No acute bone abnormality. No lytic or blastic bone lesion. Review of the MIP images confirms the above findings. CT ABDOMEN and PELVIS FINDINGS Hepatobiliary: Cholelithiasis without pericholecystic inflammatory change. Tiny cyst within the right hepatic dome. Liver otherwise unremarkable. No intra or extrahepatic biliary ductal dilation. Pancreas: Unremarkable. No pancreatic ductal dilatation or surrounding inflammatory changes. Spleen: Normal in size without focal abnormality. Adrenals/Urinary Tract: The adrenal glands are unremarkable. The kidneys are normal in size and position. Simple cortical cyst noted within the lower pole right kidney for which no follow-up imaging is recommended. The kidneys are otherwise unremarkable. Foley catheter balloon is seen within a largely decompressed bladder lumen. There is circumferential marked bladder wall thickening suggesting changes of chronic bladder outlet obstruction, similar to prior  examination. Stomach/Bowel: Previously noted button type PEG tube has been exchanged for a balloon retention gastrostomy catheter which is in expected position within the gastric lumen. Infiltration along the tract of the gastrostomy catheter appears improved since prior examination related to leakage or inflammation along the tract. Moderate fluid is again seen within the subcutaneous soft tissues of the left lateral abdominal wall as well as within the fascial planes of the lateral abdominal wall musculature, increased since prior examination. Stomach, small bowel, and large bowel are otherwise unremarkable. Appendix normal. No free intraperitoneal gas or fluid. Vascular/Lymphatic: Extensive atherosclerotic calcification within the aortoiliac vasculature. No aortic aneurysm. No pathologic adenopathy within the abdomen and pelvis. Reproductive: Marked prostatic hypertrophy. Other: Mild diffuse subcutaneous edema again noted  within the gluteal soft tissues and visualized lower extremities bilaterally. Musculoskeletal: No acute bone abnormality. No lytic or blastic bone lesion. Review of the MIP images confirms the above findings. IMPRESSION: 1. No pulmonary embolism. 2. Extensive endoluminal debris within the tracheobronchial tree with near complete impaction of the left bronchial tree and scattered airway impaction within the right lower lobe. Bibasilar pulmonary infiltrates. Together, the findings suggest changes of aspiration. 3. Small bilateral pleural effusions, new since prior examination but improved since CT examination of the abdomen pelvis of 10/08/2022. 4. Moderate coronary artery calcification. 5. Cholelithiasis. 6. Marked prostatic hypertrophy. Changes of chronic bladder outlet obstruction with Foley decompression of the bladder. 7. Interval gastrostomy exchange. Mild subcutaneous infiltration along the gastrostomy catheter tract and moderate fluid within the subcutaneous soft tissues of the left  lateral abdominal wall as well as within the fascial planes of the lateral abdominal wall musculature may reflect changes of leakage along an immature tract and appears increased since prior examination. Aortic Atherosclerosis (ICD10-I70.0). Electronically Signed   By: Helyn Numbers M.D.   On: 10/21/2022 02:56   DG Chest 2 View  Result Date: 10/20/2022 CLINICAL DATA:  Cough.  Recently diagnosed with pneumonia. EXAM: CHEST - 2 VIEW COMPARISON:  10/02/2022, CT abdomen 10/08/2022 FINDINGS: Lungs are adequately inflated demonstrate hazy left base/retrocardiac opacification likely due to small effusion with possible associated atelectasis. Hazy opacification over the right infrahilar region which may be due to asymmetric edema versus infection. Overall interval improved bibasilar opacification likely improved effusions. Cardiomediastinal silhouette and remainder of the exam is unchanged. IMPRESSION: 1. Improved hazy left base/retrocardiac opacification likely due to small effusion with possible associated atelectasis. 2. Improved hazy opacification over the right infrahilar region which may be due to asymmetric edema, atelectasis or infection. Electronically Signed   By: Elberta Fortis M.D.   On: 10/20/2022 11:41     Labs:   Basic Metabolic Panel: Recent Labs  Lab 10/24/22 0455 10/25/22 0515 10/26/22 0447 10/28/22 0617 10/30/22 0458  NA 139 139 140 141 139  K 3.3* 3.6 3.5 3.5 4.4  CL 107 109 108 108 103  CO2 21* 22 24 26 23   GLUCOSE 138* 200* 205* 192* 230*  BUN 21 17 19  25* 23  CREATININE 1.09 1.09 1.04 1.16 1.00  CALCIUM 8.2* 8.1* 7.9* 8.1* 8.2*   GFR Estimated Creatinine Clearance: 63.8 mL/min (by C-G formula based on SCr of 1 mg/dL). Liver Function Tests: No results for input(s): "AST", "ALT", "ALKPHOS", "BILITOT", "PROT", "ALBUMIN" in the last 168 hours. No results for input(s): "LIPASE", "AMYLASE" in the last 168 hours. No results for input(s): "AMMONIA" in the last 168  hours. Coagulation profile No results for input(s): "INR", "PROTIME" in the last 168 hours.  CBC: Recent Labs  Lab 10/27/22 0601 10/28/22 0617 10/28/22 1942 10/29/22 1758 10/30/22 0458  WBC 6.4 6.3 6.6 4.9 6.3  NEUTROABS 4.0 3.8  --  3.1 4.3  HGB 7.5* 6.7* 7.4* 7.2* 7.7*  HCT 24.2* 22.4* 23.4* 23.5* 25.5*  MCV 90.0 91.4 88.6 89.7 91.1  PLT 156 178 172 214 245   Cardiac Enzymes: No results for input(s): "CKTOTAL", "CKMB", "CKMBINDEX", "TROPONINI" in the last 168 hours. BNP: Invalid input(s): "POCBNP" CBG: Recent Labs  Lab 10/29/22 0735 10/29/22 1124 10/29/22 1715 10/29/22 2216 10/30/22 0852  GLUCAP 132* 155* 194* 174* 230*   D-Dimer No results for input(s): "DDIMER" in the last 72 hours. Hgb A1c No results for input(s): "HGBA1C" in the last 72 hours. Lipid Profile No results for  input(s): "CHOL", "HDL", "LDLCALC", "TRIG", "CHOLHDL", "LDLDIRECT" in the last 72 hours. Thyroid function studies No results for input(s): "TSH", "T4TOTAL", "T3FREE", "THYROIDAB" in the last 72 hours.  Invalid input(s): "FREET3" Anemia work up No results for input(s): "VITAMINB12", "FOLATE", "FERRITIN", "TIBC", "IRON", "RETICCTPCT" in the last 72 hours. Microbiology Recent Results (from the past 240 hour(s))  Resp panel by RT-PCR (RSV, Flu A&B, Covid) Anterior Nasal Swab     Status: None   Collection Time: 10/20/22 11:08 AM   Specimen: Anterior Nasal Swab  Result Value Ref Range Status   SARS Coronavirus 2 by RT PCR NEGATIVE NEGATIVE Final    Comment: (NOTE) SARS-CoV-2 target nucleic acids are NOT DETECTED.  The SARS-CoV-2 RNA is generally detectable in upper respiratory specimens during the acute phase of infection. The lowest concentration of SARS-CoV-2 viral copies this assay can detect is 138 copies/mL. A negative result does not preclude SARS-Cov-2 infection and should not be used as the sole basis for treatment or other patient management decisions. A negative result may occur  with  improper specimen collection/handling, submission of specimen other than nasopharyngeal swab, presence of viral mutation(s) within the areas targeted by this assay, and inadequate number of viral copies(<138 copies/mL). A negative result must be combined with clinical observations, patient history, and epidemiological information. The expected result is Negative.  Fact Sheet for Patients:  BloggerCourse.com  Fact Sheet for Healthcare Providers:  SeriousBroker.it  This test is no t yet approved or cleared by the [place] FDA and  has been authorized for detection and/or diagnosis of SARS-CoV-2 by FDA under an Emergency Use Authorization (EUA). This EUA will remain  in effect (meaning this test can be used) for the duration of the COVID-19 declaration under Section 564(b)(1) of the Act, 21 U.S.C.section 360bbb-3(b)(1), unless the authorization is terminated  or revoked sooner.       Influenza A by PCR NEGATIVE NEGATIVE Final   Influenza B by PCR NEGATIVE NEGATIVE Final    Comment: (NOTE) The Xpert Xpress SARS-CoV-2/FLU/RSV plus assay is intended as an aid in the diagnosis of influenza from Nasopharyngeal swab specimens and should not be used as a sole basis for treatment. Nasal washings and aspirates are unacceptable for Xpert Xpress SARS-CoV-2/FLU/RSV testing.  Fact Sheet for Patients: BloggerCourse.com  Fact Sheet for Healthcare Providers: SeriousBroker.it  This test is not yet approved or cleared by the [place] FDA and has been authorized for detection and/or diagnosis of SARS-CoV-2 by FDA under an Emergency Use Authorization (EUA). This EUA will remain in effect (meaning this test can be used) for the duration of the COVID-19 declaration under Section 564(b)(1) of the Act, 21 U.S.C. section 360bbb-3(b)(1), unless the authorization is terminated  or revoked.     Resp Syncytial Virus by PCR NEGATIVE NEGATIVE Final    Comment: (NOTE) Fact Sheet for Patients: BloggerCourse.com  Fact Sheet for Healthcare Providers: SeriousBroker.it  This test is not yet approved or cleared by the [place] FDA and has been authorized for detection and/or diagnosis of SARS-CoV-2 by FDA under an Emergency Use Authorization (EUA). This EUA will remain in effect (meaning this test can be used) for the duration of the COVID-19 declaration under Section 564(b)(1) of the Act, 21 U.S.C. section 360bbb-3(b)(1), unless the authorization is terminated or revoked.  Performed at [facility], [address], [place], [place] [address]   C Difficile Quick Screen w PCR reflex     Status: None   Collection Time: 10/21/22  5:45 PM  Specimen: Stool  Result Value Ref Range Status   C Diff antigen NEGATIVE NEGATIVE Final   C Diff toxin NEGATIVE NEGATIVE Final   C Diff interpretation No C. difficile detected.  Final    Comment: Performed at [facility], [address] [place], [place] [address]  MRSA Next Gen by PCR, Nasal     Status: None   Collection Time: 10/21/22  5:58 PM   Specimen: Nasal Mucosa; Nasal Swab  Result Value Ref Range Status   MRSA by PCR Next Gen NOT DETECTED NOT DETECTED Final    Comment: (NOTE) The GeneXpert MRSA Assay (FDA approved for NASAL specimens only), is one component of a comprehensive MRSA colonization surveillance program. It is not intended to diagnose MRSA infection nor to guide or monitor treatment for MRSA infections. Test performance is not FDA approved in patients less than 62 years old. Performed at [facility], [address], [place], [place] [address]   Gastrointestinal Panel by PCR , Stool     Status: Abnormal   Collection Time: 10/21/22  5:58 PM   Specimen: Stool  Result Value Ref Range Status    Campylobacter species NOT DETECTED NOT DETECTED Final   Plesimonas shigelloides NOT DETECTED NOT DETECTED Final   Salmonella species NOT DETECTED NOT DETECTED Final   Yersinia enterocolitica NOT DETECTED NOT DETECTED Final   Vibrio species NOT DETECTED NOT DETECTED Final   Vibrio cholerae NOT DETECTED NOT DETECTED Final   Enteroaggregative E coli (EAEC) NOT DETECTED NOT DETECTED Final   Enteropathogenic E coli (EPEC) NOT DETECTED NOT DETECTED Final   Enterotoxigenic E coli (ETEC) DETECTED (A) NOT DETECTED Final    Comment: RESULT CALLED TO, READ BACK BY AND VERIFIED WITH: JULIANNE TADDEO RN @2200  10/22/22 ASW    Shiga like toxin producing E coli (STEC) NOT DETECTED NOT DETECTED Final   Shigella/Enteroinvasive E coli (EIEC) NOT DETECTED NOT DETECTED Final   Cryptosporidium NOT DETECTED NOT DETECTED Final   Cyclospora cayetanensis NOT DETECTED NOT DETECTED Final   Entamoeba histolytica NOT DETECTED NOT DETECTED Final   Giardia lamblia NOT DETECTED NOT DETECTED Final   Adenovirus F40/41 NOT DETECTED NOT DETECTED Final   Astrovirus NOT DETECTED NOT DETECTED Final   Norovirus GI/GII NOT DETECTED NOT DETECTED Final   Rotavirus A NOT DETECTED NOT DETECTED Final   Sapovirus (I, II, IV, and V) NOT DETECTED NOT DETECTED Final    Comment: Performed at [facility], [address], [place], [place] [address]    Time coordinating discharge: 45 minutes  Signed: Melina Schools Jenness Stemler  [facility] 10/30/2022, 10:27 AM

## 2022-10-30 NOTE — TOC Transition Note (Signed)
Transition of Care [facility]) - CM/SW Discharge Note   Patient Details  Name: Harry [name] MRN: [id number] Date of Birth: 23-Sep-1945  [facility] CM/SW Contact:  Lanier Clam, RN Phone Number: 10/30/2022, 10:04 AM   Clinical Narrative: d/c back to [facility]-LTC rep Starr-accepted back with PEG/osmolite MD upated. Await d/c summary prior rm#, report tel# & PTAR. -1p-Going to [facility]-rm#[id number],report tel#[phone]. PTAR called. No further CM needs.      Final next level of care: Long Term Nursing Home Barriers to Discharge: No Barriers Identified   Patient Goals and CMS Choice CMS Medicare.gov Compare Post Acute Care list provided to:: Patient Represenative (must comment) ([name](dtr)) Choice offered to / list presented to : Adult Children  Discharge Placement                    Name of family member notified: Ned Grace [phone] Patient and family notified of of transfer: 10/30/22  Discharge Plan and Services Additional resources added to the After Visit Summary for     Discharge Planning Services: CM Consult Post Acute Care Choice: Nursing Home                               Social Determinants of Health (SDOH) Interventions SDOH Screenings   Food Insecurity: No Food Insecurity (10/20/2022)  Housing: Patient Unable To Answer (10/20/2022)  Transportation Needs: No Transportation Needs (10/20/2022)  Utilities: Not At Risk (10/20/2022)  Depression (PHQ2-9): Low Risk  (03/01/2020)  Financial Resource Strain: Low Risk  (09/29/2018)  Physical Activity: Inactive (09/29/2018)  Tobacco Use: Medium Risk (10/20/2022)     Readmission Risk Interventions    10/22/2022   11:00 AM 10/21/2022    9:19 AM 10/08/2022    1:37 PM  Readmission Risk Prevention Plan  Transportation Screening Complete Complete Complete  PCP or Specialist Appt within 3-5 Days  Complete   HRI or Home Care Consult  Complete   Social Work Consult for Recovery Care  Planning/Counseling  Complete   Palliative Care Screening  Not Complete   Medication Review Oceanographer) Complete Not Complete Complete  PCP or Specialist appointment within 3-5 days of discharge Complete  Complete  HRI or Home Care Consult Complete    SW Recovery Care/Counseling Consult Complete    Palliative Care Screening Complete    Skilled Nursing Facility Not Applicable
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
## 2022-10-30 NOTE — ED Notes (Signed)
Assumed care of pt sent by [facility] place for uti and sob. Pt discharged from inpatient to [facility] 1 hour pta here. Patient was being treated for pnu and multiple pressure ulcers. Patient has gtube and indwelling foley catheter. Patient a/o x 2 at baseline does follow commands is bed bound pending dx results

## 2022-10-31 ENCOUNTER — Inpatient Hospital Stay (HOSPITAL_COMMUNITY): Payer: Medicare Other

## 2022-10-31 DIAGNOSIS — I5031 Acute diastolic (congestive) heart failure: Secondary | ICD-10-CM

## 2022-10-31 DIAGNOSIS — D649 Anemia, unspecified: Secondary | ICD-10-CM

## 2022-10-31 DIAGNOSIS — I509 Heart failure, unspecified: Secondary | ICD-10-CM | POA: Diagnosis not present

## 2022-10-31 DIAGNOSIS — J9601 Acute respiratory failure with hypoxia: Secondary | ICD-10-CM

## 2022-10-31 LAB — CBC
HCT: 24.3 % — ABNORMAL LOW (ref 39.0–52.0)
Hemoglobin: 7.6 g/dL — ABNORMAL LOW (ref 13.0–17.0)
MCH: 28.1 pg (ref 26.0–34.0)
MCHC: 31.3 g/dL (ref 30.0–36.0)
MCV: 90 fL (ref 80.0–100.0)
Platelets: 302 10*3/uL (ref 150–400)
RBC: 2.7 MIL/uL — ABNORMAL LOW (ref 4.22–5.81)
RDW: 17.6 % — ABNORMAL HIGH (ref 11.5–15.5)
WBC: 7.5 10*3/uL (ref 4.0–10.5)
nRBC: 0 % (ref 0.0–0.2)

## 2022-10-31 LAB — ECHOCARDIOGRAM COMPLETE
Area-P 1/2: 4.49 cm2
Calc EF: 58.4 %
Height: 69 in
S' Lateral: 2.9 cm
Single Plane A2C EF: 61.7 %
Single Plane A4C EF: 55.9 %
Weight: 2892.44 oz

## 2022-10-31 LAB — BASIC METABOLIC PANEL
Anion gap: 9 (ref 5–15)
BUN: 28 mg/dL — ABNORMAL HIGH (ref 8–23)
CO2: 26 mmol/L (ref 22–32)
Calcium: 8.3 mg/dL — ABNORMAL LOW (ref 8.9–10.3)
Chloride: 109 mmol/L (ref 98–111)
Creatinine, Ser: 1.38 mg/dL — ABNORMAL HIGH (ref 0.61–1.24)
GFR, Estimated: 53 mL/min — ABNORMAL LOW (ref >60)
Glucose, Bld: 155 mg/dL — ABNORMAL HIGH (ref 70–99)
Potassium: 3.6 mmol/L (ref 3.5–5.1)
Sodium: 144 mmol/L (ref 135–145)

## 2022-10-31 LAB — TSH: TSH: 4.75 u[IU]/mL — ABNORMAL HIGH (ref 0.350–4.500)

## 2022-10-31 MED ORDER — ACETAMINOPHEN 325 MG PO TABS
650.0000 mg | ORAL_TABLET | ORAL | Status: DC | PRN
  Administered 2022-11-08 – 2022-11-11 (×4): 650 mg via ORAL
  Filled 2022-10-31 (×5): qty 2

## 2022-10-31 MED ORDER — SODIUM CHLORIDE 0.9% FLUSH
3.0000 mL | Freq: Two times a day (BID) | INTRAVENOUS | Status: DC
  Administered 2022-10-31 – 2022-11-12 (×25): 3 mL via INTRAVENOUS

## 2022-10-31 MED ORDER — TAMSULOSIN HCL 0.4 MG PO CAPS
0.4000 mg | ORAL_CAPSULE | Freq: Every day | ORAL | Status: DC
  Administered 2022-10-31 – 2022-11-12 (×13): 0.4 mg via ORAL
  Filled 2022-10-31 (×13): qty 1

## 2022-10-31 MED ORDER — POLYVINYL ALCOHOL 1.4 % OP SOLN
1.0000 [drp] | Freq: Three times a day (TID) | OPHTHALMIC | Status: DC
  Administered 2022-10-31 – 2022-11-12 (×37): 1 [drp] via OPHTHALMIC
  Filled 2022-10-31: qty 15

## 2022-10-31 MED ORDER — PANTOPRAZOLE SODIUM 40 MG PO TBEC
40.0000 mg | DELAYED_RELEASE_TABLET | Freq: Two times a day (BID) | ORAL | Status: DC
  Administered 2022-10-31 – 2022-11-01 (×3): 40 mg via ORAL
  Filled 2022-10-31 (×3): qty 1

## 2022-10-31 MED ORDER — SODIUM CHLORIDE 0.9 % IV SOLN: 250.0000 mL | INTRAVENOUS | Status: DC | PRN

## 2022-10-31 MED ORDER — ENOXAPARIN SODIUM 40 MG/0.4ML IJ SOSY
40.0000 mg | PREFILLED_SYRINGE | INTRAMUSCULAR | Status: DC
  Administered 2022-10-31 – 2022-11-01 (×2): 40 mg via SUBCUTANEOUS
  Filled 2022-10-31 (×2): qty 0.4

## 2022-10-31 MED ORDER — CARVEDILOL 6.25 MG PO TABS
6.2500 mg | ORAL_TABLET | Freq: Two times a day (BID) | ORAL | Status: DC
  Administered 2022-10-31 – 2022-11-01 (×2): 6.25 mg
  Filled 2022-10-31: qty 1
  Filled 2022-10-31: qty 2
  Filled 2022-10-31: qty 1

## 2022-10-31 MED ORDER — SERTRALINE HCL 50 MG PO TABS
25.0000 mg | ORAL_TABLET | Freq: Every day | ORAL | Status: DC
  Administered 2022-10-31 – 2022-11-01 (×2): 25 mg
  Filled 2022-10-31 (×2): qty 1

## 2022-10-31 MED ORDER — OSMOLITE 1.5 CAL PO LIQD
1000.0000 mL | ORAL | Status: DC
  Administered 2022-10-31 – 2022-11-01 (×2): 1000 mL
  Filled 2022-10-31 (×6): qty 1000

## 2022-10-31 MED ORDER — IPRATROPIUM-ALBUTEROL 0.5-2.5 (3) MG/3ML IN SOLN
3.0000 mL | RESPIRATORY_TRACT | Status: DC | PRN
  Administered 2022-10-31: 3 mL via RESPIRATORY_TRACT
  Filled 2022-10-31: qty 3

## 2022-10-31 MED ORDER — FREE WATER
150.0000 mL | Status: DC
  Administered 2022-10-31 – 2022-11-12 (×68): 150 mL

## 2022-10-31 MED ORDER — DOCUSATE SODIUM 50 MG/5ML PO LIQD
100.0000 mg | Freq: Two times a day (BID) | ORAL | Status: DC
  Administered 2022-10-31 – 2022-11-12 (×13): 100 mg
  Filled 2022-10-31 (×26): qty 10

## 2022-10-31 MED ORDER — INSULIN GLARGINE-YFGN 100 UNIT/ML ~~LOC~~ SOLN
13.0000 [IU] | Freq: Every day | SUBCUTANEOUS | Status: DC
  Administered 2022-10-31 – 2022-11-02 (×3): 13 [IU] via SUBCUTANEOUS
  Filled 2022-10-31 (×4): qty 0.13

## 2022-10-31 MED ORDER — FERROUS GLUCONATE 324 (38 FE) MG PO TABS
324.0000 mg | ORAL_TABLET | Freq: Every day | ORAL | Status: DC
  Administered 2022-10-31 – 2022-11-11 (×12): 324 mg via ORAL
  Filled 2022-10-31 (×13): qty 1

## 2022-10-31 MED ORDER — ROSUVASTATIN CALCIUM 5 MG PO TABS
5.0000 mg | ORAL_TABLET | Freq: Every day | ORAL | Status: DC
  Administered 2022-10-31 – 2022-11-11 (×12): 5 mg
  Filled 2022-10-31 (×13): qty 1

## 2022-10-31 MED ORDER — ONDANSETRON HCL 4 MG/2ML IJ SOLN: 4.0000 mg | Freq: Four times a day (QID) | INTRAMUSCULAR | Status: DC | PRN

## 2022-10-31 MED ORDER — ADULT MULTIVITAMIN W/MINERALS CH
1.0000 | ORAL_TABLET | Freq: Every day | ORAL | Status: DC
  Administered 2022-10-31 – 2022-11-12 (×13): 1
  Filled 2022-10-31 (×13): qty 1

## 2022-10-31 MED ORDER — SODIUM CHLORIDE 0.9% FLUSH: 3.0000 mL | INTRAVENOUS | Status: DC | PRN

## 2022-10-31 MED ORDER — NYSTATIN 100000 UNIT/ML MT SUSP
15.0000 mL | Freq: Three times a day (TID) | OROMUCOSAL | Status: DC
  Administered 2022-10-31 – 2022-11-01 (×6): 1500000 [IU] via ORAL
  Filled 2022-10-31 (×7): qty 15

## 2022-10-31 MED ORDER — CHLORHEXIDINE GLUCONATE CLOTH 2 % EX PADS
6.0000 | MEDICATED_PAD | Freq: Every day | CUTANEOUS | Status: DC
  Administered 2022-10-31 – 2022-11-12 (×13): 6 via TOPICAL

## 2022-10-31 MED ORDER — AMLODIPINE BESYLATE 5 MG PO TABS
5.0000 mg | ORAL_TABLET | Freq: Every day | ORAL | Status: DC
  Administered 2022-10-31 – 2022-11-01 (×2): 5 mg
  Filled 2022-10-31 (×2): qty 1

## 2022-10-31 MED ORDER — LOSARTAN POTASSIUM 25 MG PO TABS: 25.0000 mg | ORAL_TABLET | Freq: Every day | ORAL | Status: DC | PRN

## 2022-10-31 MED ORDER — PYRIDOSTIGMINE BROMIDE 60 MG PO TABS
30.0000 mg | ORAL_TABLET | Freq: Three times a day (TID) | ORAL | Status: DC
  Administered 2022-10-31 – 2022-11-12 (×38): 30 mg
  Filled 2022-10-31 (×15): qty 1
  Filled 2022-10-31: qty 0.5
  Filled 2022-10-31 (×7): qty 1
  Filled 2022-10-31: qty 0.5
  Filled 2022-10-31 (×15): qty 1

## 2022-10-31 MED ORDER — LOSARTAN POTASSIUM 50 MG PO TABS: 25.0000 mg | ORAL_TABLET | Freq: Every day | ORAL | Status: DC | PRN

## 2022-10-31 MED ORDER — FUROSEMIDE 10 MG/ML IJ SOLN
40.0000 mg | Freq: Every day | INTRAMUSCULAR | Status: DC
  Administered 2022-10-31 – 2022-11-01 (×2): 40 mg via INTRAVENOUS
  Filled 2022-10-31 (×2): qty 4

## 2022-10-31 MED ORDER — HYDRALAZINE HCL 25 MG PO TABS
25.0000 mg | ORAL_TABLET | Freq: Three times a day (TID) | ORAL | Status: DC
  Administered 2022-10-31 – 2022-11-01 (×4): 25 mg
  Filled 2022-10-31 (×4): qty 1

## 2022-10-31 NOTE — Progress Notes (Signed)
[facility] PROGRESS NOTE   DERWIN POKORNEY HQI:[id number] DOB: [birth year] DOA: 10/30/2022  PCP: Crist Fat, MD  Brief History/Interval Summary: 77 y.o. male with a known history of CVA, CKD, hypertension, hyperlipidemia, Lewy body dementia peripheral vascular disease, CVA and type 2 diabetes presents to the emergency department for evaluation of respiratory distress.  Patient was discharged from the hospital on 10/30/22 to [facility] after admission for anemia requiring transfusion, aspiration pneumonia and bilateral LE pressure ulcers.  He was at the facility for one hour and was found to be hypoxic into the 70s and in respiratory distress.  CPAP was started as well as a nebulizer.  Of note patient has indwelling foley and GTube.   Consultants: None  Procedures: None yet    Subjective/Interval History: Patient denies any chest pain shortness of breath.  Denies any difficulty breathing this morning.  He appears to be confused.    Assessment/Plan:  Acute respiratory failure with hypoxemia Noted to be on oxygen by nasal cannula 3 L/min.  Respiratory failure thought to be secondary to CHF.  He was noted to have bilateral pleural effusions on imaging studies. COVID-19, influenza and RSV PCR's were negative. Patient initially required CPAP as well which has been taken off at this time.  Respiratory status appears to be stable.  Acute on chronic diastolic CHF Based on recent echocardiogram from July he has normal systolic function.  He has grade 1 diastolic dysfunction. Pleural effusions noted on chest x-ray.  Started on furosemide.  Continue once daily for now.  Chronic anemia/chronic GI blood loss Seems to be close to baseline. During recent hospitalization he was transfused PRBC.  There was concern for GI bleeding.  Gastroenterology was consulted who did not recommend any invasive studies due to high risk. Aspirin and Plavix were discontinued as he was having persistent  dark tarry stools in the hospital. He was discharged on PPI and Carafate. Continue to monitor.  Essential hypertension Continue home medications.  Monitor blood pressures closely.  History of urinary retention with chronic indwelling Foley Continue Foley catheter.  Continue Flomax.  History of stroke Aspirin and Plavix held due to GI bleed.  History of Lewy body dementia This is stable this morning.  Hyperlipidemia Continue statin.  History of diabetes Noted to be on glargine and SSI.  Monitor CBGs.  HbA1c 6.5 in March.  Chronic dysphagia with PEG tube Continue tube feedings.  Recent aspiration pneumonia Completed course of antibiotics.  Chronic kidney disease stage IIIa Renal function close to baseline.  DVT Prophylaxis: SCDs only for now.  Will discontinue Lovenox due to recent GI bleed. Code Status: Full code Family Communication: No family at bedside Disposition Plan: SNF when medically stable  Status is: Inpatient Remains inpatient appropriate because: Acute respiratory failure with hypoxia      Medications: Scheduled:  amLODipine  5 mg Per Tube Daily   carvedilol  6.25 mg Per Tube BID WC   docusate  100 mg Per Tube BID   enoxaparin (LOVENOX) injection  40 mg Subcutaneous Q24H   ferrous gluconate  324 mg Oral QHS   free water  150 mL Per Tube Q4H   furosemide  40 mg Intravenous Daily   hydrALAZINE  25 mg Per Tube Q8H   insulin glargine-yfgn  13 Units Subcutaneous Daily   multivitamin with minerals  1 tablet Per Tube Daily   nystatin  15 mL Oral TID   pantoprazole  40 mg Oral BID AC   polyvinyl alcohol  1 drop Both Eyes TID   pyridostigmine  30 mg Per Tube Q8H   rosuvastatin  5 mg Per Tube QHS   sertraline  25 mg Per Tube Daily   sodium chloride flush  3 mL Intravenous Q12H   tamsulosin  0.4 mg Oral Daily   Continuous:  sodium chloride     feeding supplement (OSMOLITE 1.5 CAL) 1,000 mL (10/31/22 0340)   LOV:FIEPPI chloride, acetaminophen,  ipratropium-albuterol, losartan, ondansetron (ZOFRAN) IV, sodium chloride flush  Antibiotics: Anti-infectives (From admission, onward)    None       Objective:  Vital Signs  Vitals:   10/31/22 0500 10/31/22 0515 10/31/22 0530 10/31/22 0650  BP: (!) 140/72 139/73 137/71 (!) 150/75  Pulse: 82 84 82   Resp: 19 (!) 22 18   Temp:      TempSrc:      SpO2: 96% 95% 95%   Weight:      Height:       No intake or output data in the 24 hours ending 10/31/22 0834 Filed Weights   10/30/22 1748  Weight: 82 kg    General appearance: Awake alert.  In no distress Resp: Tachypneic but no use of accessory muscles noted.  Crackles bilateral bases.  Dullness to percussion bilateral bases.  No wheezing or rhonchi. Cardio: S1-S2 is normal regular.  No S3-S4.  No rubs murmurs or bruit GI: Abdomen is soft.  Nontender nondistended.  Bowel sounds are present normal.  No masses organomegaly.  PEG was noted Extremities: Mild edema bilateral lower extremities Neurologic: focal neurological deficits.    Lab Results:  Data Reviewed: I have personally reviewed following labs and reports of the imaging studies  CBC: Recent Labs  Lab 10/27/22 0601 10/28/22 0617 10/28/22 1942 10/29/22 1758 10/30/22 0458 10/30/22 1757 10/30/22 1810 10/31/22 0651  WBC 6.4 6.3 6.6 4.9 6.3 6.8  --  7.5  NEUTROABS 4.0 3.8  --  3.1 4.3 4.9  --   --   HGB 7.5* 6.7* 7.4* 7.2* 7.7* 7.1* 7.5* 7.6*  HCT 24.2* 22.4* 23.4* 23.5* 25.5* 23.0* 22.0* 24.3*  MCV 90.0 91.4 88.6 89.7 91.1 90.6  --  90.0  PLT 156 178 172 214 245 266  --  302    Basic Metabolic Panel: Recent Labs  Lab 10/25/22 0515 10/26/22 0447 10/28/22 0617 10/30/22 0458 10/30/22 1757 10/30/22 1810  NA 139 140 141 139 141 145  K 3.6 3.5 3.5 4.4 3.6 4.0  CL 109 108 108 103 107  --   CO2 22 24 26 23 23   --   GLUCOSE 200* 205* 192* 230* 230*  --   BUN 17 19 25* 23 29*  --   CREATININE 1.09 1.04 1.16 1.00 1.16  --   CALCIUM 8.1* 7.9* 8.1* 8.2* 8.1*   --     GFR: Estimated Creatinine Clearance: 55 mL/min (by C-G formula based on SCr of 1.16 mg/dL).  Liver Function Tests: Recent Labs  Lab 10/30/22 1757  AST 17  ALT 18  ALKPHOS 74  BILITOT 0.5  PROT 6.2*  ALBUMIN 2.1*    CBG: Recent Labs  Lab 10/29/22 1715 10/29/22 2216 10/30/22 0852 10/30/22 1258 10/30/22 1821  GLUCAP 194* 174* 230* 242* 208*    Lipid Profile: Recent Labs    10/31/22 0234  CHOL 68  HDL 19*  LDLCALC NOT CALCULATED  TRIG 80  CHOLHDL 3.6    Thyroid Function Tests: Recent Labs    10/31/22 0234  TSH 4.750*  Recent Results (from the past 240 hour(s))  C Difficile Quick Screen w PCR reflex     Status: None   Collection Time: 10/21/22  5:45 PM   Specimen: Stool  Result Value Ref Range Status   C Diff antigen NEGATIVE NEGATIVE Final   C Diff toxin NEGATIVE NEGATIVE Final   C Diff interpretation No C. difficile detected.  Final    Comment: Performed at [facility], [address] [place], [place] [address]  MRSA Next Gen by PCR, Nasal     Status: None   Collection Time: 10/21/22  5:58 PM   Specimen: Nasal Mucosa; Nasal Swab  Result Value Ref Range Status   MRSA by PCR Next Gen NOT DETECTED NOT DETECTED Final    Comment: (NOTE) The GeneXpert MRSA Assay (FDA approved for NASAL specimens only), is one component of a comprehensive MRSA colonization surveillance program. It is not intended to diagnose MRSA infection nor to guide or monitor treatment for MRSA infections. Test performance is not FDA approved in patients less than 36 years old. Performed at [facility], [address], [place], [place] [address]   Gastrointestinal Panel by PCR , Stool     Status: Abnormal   Collection Time: 10/21/22  5:58 PM   Specimen: Stool  Result Value Ref Range Status   Campylobacter species NOT DETECTED NOT DETECTED Final   Plesimonas shigelloides NOT DETECTED NOT DETECTED Final   Salmonella species NOT  DETECTED NOT DETECTED Final   Yersinia enterocolitica NOT DETECTED NOT DETECTED Final   Vibrio species NOT DETECTED NOT DETECTED Final   Vibrio cholerae NOT DETECTED NOT DETECTED Final   Enteroaggregative E coli (EAEC) NOT DETECTED NOT DETECTED Final   Enteropathogenic E coli (EPEC) NOT DETECTED NOT DETECTED Final   Enterotoxigenic E coli (ETEC) DETECTED (A) NOT DETECTED Final    Comment: RESULT CALLED TO, READ BACK BY AND VERIFIED WITH: JULIANNE TADDEO RN @2200  10/22/22 ASW    Shiga like toxin producing E coli (STEC) NOT DETECTED NOT DETECTED Final   Shigella/Enteroinvasive E coli (EIEC) NOT DETECTED NOT DETECTED Final   Cryptosporidium NOT DETECTED NOT DETECTED Final   Cyclospora cayetanensis NOT DETECTED NOT DETECTED Final   Entamoeba histolytica NOT DETECTED NOT DETECTED Final   Giardia lamblia NOT DETECTED NOT DETECTED Final   Adenovirus F40/41 NOT DETECTED NOT DETECTED Final   Astrovirus NOT DETECTED NOT DETECTED Final   Norovirus GI/GII NOT DETECTED NOT DETECTED Final   Rotavirus A NOT DETECTED NOT DETECTED Final   Sapovirus (I, II, IV, and V) NOT DETECTED NOT DETECTED Final    Comment: Performed at [facility], [address] [place], [place] [address]  Resp panel by RT-PCR (RSV, Flu A&B, Covid) Anterior Nasal Swab     Status: None   Collection Time: 10/30/22  5:47 PM   Specimen: Anterior Nasal Swab  Result Value Ref Range Status   SARS Coronavirus 2 by RT PCR NEGATIVE NEGATIVE Final   Influenza A by PCR NEGATIVE NEGATIVE Final   Influenza B by PCR NEGATIVE NEGATIVE Final    Comment: (NOTE) The Xpert Xpress SARS-CoV-2/FLU/RSV plus assay is intended as an aid in the diagnosis of influenza from Nasopharyngeal swab specimens and should not be used as a sole basis for treatment. Nasal washings and aspirates are unacceptable for Xpert Xpress SARS-CoV-2/FLU/RSV testing.  Fact Sheet for Patients: BloggerCourse.com  Fact Sheet for Healthcare  Providers: SeriousBroker.it  This test is not yet approved or cleared by the  [place] [employer] and has been authorized for detection and/or diagnosis of SARS-CoV-2 by FDA under an TEFL teacher (EUA). This EUA will remain in effect (meaning this test can be used) for the duration of the COVID-19 declaration under Section 564(b)(1) of the Act, 21 U.S.C. section 360bbb-3(b)(1), unless the authorization is terminated or revoked.     Resp Syncytial Virus by PCR NEGATIVE NEGATIVE Final    Comment: (NOTE) Fact Sheet for Patients: BloggerCourse.com  Fact Sheet for Healthcare Providers: SeriousBroker.it  This test is not yet approved or cleared by the [place] FDA and has been authorized for detection and/or diagnosis of SARS-CoV-2 by FDA under an Emergency Use Authorization (EUA). This EUA will remain in effect (meaning this test can be used) for the duration of the COVID-19 declaration under Section 564(b)(1) of the Act, 21 U.S.C. section 360bbb-3(b)(1), unless the authorization is terminated or revoked.  Performed at [facility], [address] [place], [place] [address]       Radiology Studies: DG Chest Port 1 View  Result Date: 10/30/2022 CLINICAL DATA:  Shortness of breath. EXAM: PORTABLE CHEST 1 VIEW COMPARISON:  October 27, 2022 FINDINGS: The heart size and mediastinal contours are within normal limits. Stable patchy moderate severity bilateral perihilar airspace opacities are seen, left greater than right. There is a small, partially layering right pleural effusion. A moderate size left pleural effusion is also noted. No pneumothorax is identified. The visualized skeletal structures are unremarkable. IMPRESSION: 1. Stable moderate severity bilateral perihilar airspace disease, left greater than right. 2. Bilateral pleural effusions, left greater than right. Electronically Signed    By: Aram Candela M.D.   On: 10/30/2022 19:39   CT Head Wo Contrast  Result Date: 10/30/2022 CLINICAL DATA:  Altered mental status EXAM: CT HEAD WITHOUT CONTRAST TECHNIQUE: Contiguous axial images were obtained from the base of the skull through the vertex without intravenous contrast. RADIATION DOSE REDUCTION: This exam was performed according to the departmental dose-optimization program which includes automated exposure control, adjustment of the mA and/or kV according to patient size and/or use of iterative reconstruction technique. COMPARISON:  10/05/2022 FINDINGS: Brain: There is no acute hemorrhage, mass or extra-axial collection. There is periventricular hypoattenuation compatible with chronic microvascular disease. There are old infarcts of the left occipital lobe and bilateral cerebellum with associated encephalomalacia. Mild generalized volume loss. Vascular: Atherosclerotic calcification of the internal carotid arteries at the skull base. No abnormal hyperdensity of the major intracranial arteries or dural venous sinuses. Skull: The visualized skull base, calvarium and extracranial soft tissues are normal. Sinuses/Orbits: No fluid levels or advanced mucosal thickening of the visualized paranasal sinuses. No mastoid or middle ear effusion. The orbits are normal. IMPRESSION: 1. No acute intracranial abnormality. 2. 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 Electronically Signed   By: Deatra Robinson M.D.   On: 10/30/2022 19:24       LOS: 1 day   Osvaldo Shipper  [facility] Pager on [email or web address]  10/31/2022, 8:34 AM
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
## 2022-10-31 NOTE — Progress Notes (Signed)
Echocardiogram 2D Echocardiogram has been performed.  Harry [name] 10/31/2022, 12:16 PM

## 2022-10-31 NOTE — Inpatient Diabetes Management (Signed)
Inpatient Diabetes Program Recommendations  AACE/ADA: New Consensus Statement on Inpatient Glycemic Control (2015)  Target Ranges:  Prepandial:   less than 140 mg/dL      Peak postprandial:   less than 180 mg/dL (1-2 hours)      Critically ill patients:  140 - 180 mg/dL   Lab Results  Component Value Date   GLUCAP 208 (H) 10/30/2022   HGBA1C 6.5 (H) 06/02/2022    Review of Glycemic Control  Latest Reference Range & Units 10/30/22 08:52 10/30/22 12:58 10/30/22 18:21  Glucose-Capillary 70 - 99 mg/dL 433 (H) 295 (H) 188 (H)  (H): Data is abnormally high  Diabetes history: DM2 Outpatient Diabetes medications: Lantus 13 units every day, Novolog 0-12 units TID Current orders for Inpatient glycemic control: Semglee 13 units QD  Inpatient Diabetes Program Recommendations:    Please consider:  Novolog 0-9 units TID  Will continue to follow while inpatient.  Thank you, Dulce Sellar, MSN, CDCES Diabetes Coordinator Inpatient Diabetes Program [phone] (team pager from 8a-5p)

## 2022-10-31 NOTE — Progress Notes (Signed)
Heart Failure Navigator Progress Note  Assessed for Heart & Vascular TOC clinic readiness.  Patient does not meet criteria due to dementia per MD.   Navigator will sign off at this time.    Jude Naclerio,RN, BSN,MSN Heart Failure Nurse Navigator. Contact by secure chat only.

## 2022-10-31 NOTE — ED Notes (Signed)
Patient has been getting scans since 0915am will update vitals when finished

## 2022-11-01 ENCOUNTER — Inpatient Hospital Stay (HOSPITAL_COMMUNITY): Payer: Medicare Other

## 2022-11-01 DIAGNOSIS — T17998A Other foreign object in respiratory tract, part unspecified causing other injury, initial encounter: Secondary | ICD-10-CM | POA: Diagnosis not present

## 2022-11-01 DIAGNOSIS — J9811 Atelectasis: Secondary | ICD-10-CM | POA: Diagnosis not present

## 2022-11-01 DIAGNOSIS — G3183 Dementia with Lewy bodies: Secondary | ICD-10-CM

## 2022-11-01 DIAGNOSIS — Z7189 Other specified counseling: Secondary | ICD-10-CM | POA: Diagnosis not present

## 2022-11-01 DIAGNOSIS — T17500A Unspecified foreign body in bronchus causing asphyxiation, initial encounter: Secondary | ICD-10-CM | POA: Diagnosis not present

## 2022-11-01 DIAGNOSIS — F02C18 Dementia in other diseases classified elsewhere, severe, with other behavioral disturbance: Secondary | ICD-10-CM

## 2022-11-01 DIAGNOSIS — R0603 Acute respiratory distress: Secondary | ICD-10-CM

## 2022-11-01 DIAGNOSIS — D649 Anemia, unspecified: Secondary | ICD-10-CM | POA: Diagnosis not present

## 2022-11-01 DIAGNOSIS — I5031 Acute diastolic (congestive) heart failure: Secondary | ICD-10-CM | POA: Diagnosis not present

## 2022-11-01 DIAGNOSIS — J9601 Acute respiratory failure with hypoxia: Secondary | ICD-10-CM | POA: Diagnosis not present

## 2022-11-01 DIAGNOSIS — Z515 Encounter for palliative care: Secondary | ICD-10-CM

## 2022-11-01 DIAGNOSIS — I509 Heart failure, unspecified: Secondary | ICD-10-CM | POA: Diagnosis not present

## 2022-11-01 LAB — BLOOD GAS, ARTERIAL
Acid-Base Excess: 6.2 mmol/L — ABNORMAL HIGH (ref 0.0–2.0)
Bicarbonate: 29.5 mmol/L — ABNORMAL HIGH (ref 20.0–28.0)
O2 Saturation: 95.5 %
Patient temperature: 36.4
pCO2 arterial: 36 mmHg (ref 32–48)
pH, Arterial: 7.52 — ABNORMAL HIGH (ref 7.35–7.45)
pO2, Arterial: 71 mmHg — ABNORMAL LOW (ref 83–108)

## 2022-11-01 LAB — GLUCOSE, CAPILLARY
Glucose-Capillary: 210 mg/dL — ABNORMAL HIGH (ref 70–99)
Glucose-Capillary: 211 mg/dL — ABNORMAL HIGH (ref 70–99)
Glucose-Capillary: 198 mg/dL — ABNORMAL HIGH (ref 70–99)
Glucose-Capillary: 163 mg/dL — ABNORMAL HIGH (ref 70–99)
Glucose-Capillary: 58 mg/dL — ABNORMAL LOW (ref 70–99)
Glucose-Capillary: 75 mg/dL (ref 70–99)
Glucose-Capillary: 73 mg/dL (ref 70–99)

## 2022-11-01 LAB — CBC
HCT: 21.8 % — ABNORMAL LOW (ref 39.0–52.0)
Hemoglobin: 7 g/dL — ABNORMAL LOW (ref 13.0–17.0)
MCH: 27.8 pg (ref 26.0–34.0)
MCHC: 32.1 g/dL (ref 30.0–36.0)
MCV: 86.5 fL (ref 80.0–100.0)
Platelets: 363 10*3/uL (ref 150–400)
RBC: 2.52 MIL/uL — ABNORMAL LOW (ref 4.22–5.81)
RDW: 17.4 % — ABNORMAL HIGH (ref 11.5–15.5)
WBC: 10.7 10*3/uL — ABNORMAL HIGH (ref 4.0–10.5)
nRBC: 0.3 % — ABNORMAL HIGH (ref 0.0–0.2)

## 2022-11-01 LAB — MRSA NEXT GEN BY PCR, NASAL: MRSA by PCR Next Gen: NOT DETECTED

## 2022-11-01 LAB — BASIC METABOLIC PANEL
Anion gap: 9 (ref 5–15)
BUN: 33 mg/dL — ABNORMAL HIGH (ref 8–23)
CO2: 27 mmol/L (ref 22–32)
Calcium: 7.9 mg/dL — ABNORMAL LOW (ref 8.9–10.3)
Chloride: 105 mmol/L (ref 98–111)
Creatinine, Ser: 1.5 mg/dL — ABNORMAL HIGH (ref 0.61–1.24)
GFR, Estimated: 48 mL/min — ABNORMAL LOW (ref >60)
Glucose, Bld: 203 mg/dL — ABNORMAL HIGH (ref 70–99)
Potassium: 3.9 mmol/L (ref 3.5–5.1)
Sodium: 141 mmol/L (ref 135–145)

## 2022-11-01 MED ORDER — GUAIFENESIN 100 MG/5ML PO LIQD
15.0000 mL | ORAL | Status: DC
  Administered 2022-11-01 – 2022-11-12 (×64): 15 mL
  Filled 2022-11-01 (×64): qty 20

## 2022-11-01 MED ORDER — VANCOMYCIN HCL 1250 MG/250ML IV SOLN
1250.0000 mg | INTRAVENOUS | Status: DC
  Filled 2022-11-01: qty 250

## 2022-11-01 MED ORDER — SODIUM CHLORIDE 3 % IN NEBU
4.0000 mL | INHALATION_SOLUTION | Freq: Two times a day (BID) | RESPIRATORY_TRACT | Status: AC
  Administered 2022-11-01 – 2022-11-03 (×4): 4 mL via RESPIRATORY_TRACT
  Filled 2022-11-01 (×8): qty 4

## 2022-11-01 MED ORDER — VANCOMYCIN HCL 1500 MG/300ML IV SOLN
1500.0000 mg | Freq: Once | INTRAVENOUS | Status: AC
  Administered 2022-11-01: 1500 mg via INTRAVENOUS
  Filled 2022-11-01: qty 300

## 2022-11-01 MED ORDER — PIPERACILLIN-TAZOBACTAM 3.375 G IVPB 30 MIN
3.3750 g | Freq: Once | INTRAVENOUS | Status: AC
  Administered 2022-11-01: 3.375 g via INTRAVENOUS
  Filled 2022-11-01: qty 50

## 2022-11-01 MED ORDER — DEXTROSE 50 % IV SOLN
INTRAVENOUS | Status: AC
  Filled 2022-11-01: qty 50

## 2022-11-01 MED ORDER — DEXTROSE 50 % IV SOLN
25.0000 mL | Freq: Once | INTRAVENOUS | Status: AC
  Administered 2022-11-01: 25 mL via INTRAVENOUS

## 2022-11-01 MED ORDER — PANTOPRAZOLE SODIUM 40 MG IV SOLR
40.0000 mg | Freq: Two times a day (BID) | INTRAVENOUS | Status: DC
  Administered 2022-11-01 – 2022-11-08 (×14): 40 mg via INTRAVENOUS
  Filled 2022-11-01 (×14): qty 10

## 2022-11-01 MED ORDER — PIPERACILLIN-TAZOBACTAM 3.375 G IVPB
3.3750 g | Freq: Three times a day (TID) | INTRAVENOUS | Status: DC
  Administered 2022-11-01 – 2022-11-07 (×17): 3.375 g via INTRAVENOUS
  Filled 2022-11-01 (×19): qty 50

## 2022-11-01 MED ORDER — ALBUTEROL SULFATE (2.5 MG/3ML) 0.083% IN NEBU
2.5000 mg | INHALATION_SOLUTION | RESPIRATORY_TRACT | Status: DC | PRN
  Administered 2022-11-04: 2.5 mg via RESPIRATORY_TRACT

## 2022-11-01 MED ORDER — DEXTROSE-SODIUM CHLORIDE 5-0.45 % IV SOLN: INTRAVENOUS | Status: DC

## 2022-11-01 MED ORDER — ALBUTEROL SULFATE (2.5 MG/3ML) 0.083% IN NEBU
2.5000 mg | INHALATION_SOLUTION | RESPIRATORY_TRACT | Status: DC
  Administered 2022-11-01 – 2022-11-03 (×15): 2.5 mg via RESPIRATORY_TRACT
  Filled 2022-11-01 (×15): qty 3

## 2022-11-01 MED ORDER — INSULIN ASPART 100 UNIT/ML IJ SOLN
0.0000 [IU] | INTRAMUSCULAR | Status: DC
  Administered 2022-11-01 (×2): 2 [IU] via SUBCUTANEOUS
  Administered 2022-11-01: 3 [IU] via SUBCUTANEOUS
  Administered 2022-11-02: 2 [IU] via SUBCUTANEOUS
  Administered 2022-11-03 – 2022-11-04 (×5): 1 [IU] via SUBCUTANEOUS
  Administered 2022-11-05: 2 [IU] via SUBCUTANEOUS
  Administered 2022-11-05 – 2022-11-06 (×3): 1 [IU] via SUBCUTANEOUS
  Administered 2022-11-06: 2 [IU] via SUBCUTANEOUS
  Administered 2022-11-06 – 2022-11-07 (×3): 1 [IU] via SUBCUTANEOUS
  Administered 2022-11-07: 2 [IU] via SUBCUTANEOUS
  Administered 2022-11-07 – 2022-11-08 (×5): 1 [IU] via SUBCUTANEOUS
  Administered 2022-11-08: 2 [IU] via SUBCUTANEOUS
  Administered 2022-11-08: 1 [IU] via SUBCUTANEOUS
  Administered 2022-11-09 – 2022-11-10 (×9): 2 [IU] via SUBCUTANEOUS
  Administered 2022-11-10: 1 [IU] via SUBCUTANEOUS
  Administered 2022-11-11: 3 [IU] via SUBCUTANEOUS
  Administered 2022-11-11 (×2): 1 [IU] via SUBCUTANEOUS
  Administered 2022-11-11: 2 [IU] via SUBCUTANEOUS
  Administered 2022-11-11 (×2): 1 [IU] via SUBCUTANEOUS
  Administered 2022-11-12: 5 [IU] via SUBCUTANEOUS
  Administered 2022-11-12 (×3): 2 [IU] via SUBCUTANEOUS

## 2022-11-01 NOTE — Consult Note (Signed)
Consultation Note Date: 11/01/2022   Patient Name: Harry [name]  DOB: [birth year]  MRN: [id number]  Age / Sex: 77 y.o., male  PCP: Crist Fat, MD Referring Physician: Osvaldo Shipper, MD  Reason for Consultation: Establishing goals of care  HPI/Patient Profile: 77 y.o. male  with past medical history of Lewy body dementia, CVA, CKD, dysphagia with PEG, HTN, chronic foley, PVD, and DM  admitted on 10/30/2022 with respiratory distress.   Patient was recently discharged on 8/27 to [facility] after hospitalization for acute anemia, aspiration pneumonia, bilateral pressure ulcers.  He returned for evaluation after 1 hour at the SNF due to hypoxia and respiratory symptoms.  He is now admitted for acute hypoxic respiratory failure secondary to new onset CHF.  Initial CXR was concerning for volume overload and heart failure though unfortunately, CXR on 8/29 showed complete whiteout of the left lung and concern for mucous plugging.  Patient would be high risk for prolonged mechanical ventilation after bronchoscopy.  Patient is known to PMT from prior hospitalizations.  He has been hospitalized 5 times in the past 6 months.  PMT has been consulted to assist with goals of care conversation with daughter's consent.  Clinical Assessment and Goals of Care:  I have reviewed medical records including EPIC notes, labs and imaging, discussed with RN, assessed the patient and then medical bedside with patient's daughter [name] to discuss diagnosis prognosis, GOC, EOL wishes, disposition and options.  I introduced Palliative Medicine as specialized medical care for people living with serious illness. It focuses on providing relief from the symptoms and stress of a serious illness. The goal is to improve quality of life for both the patient and the family.  We discussed a brief life review of the patient and then focused on their  current illness.   I attempted to elicit values and goals of care important to the patient.    Medical History Review and Understanding:  [name] has a good understanding the severity of patient's illness, though she has not been getting updated as frequently as she would like regarding patient's oxygen requirements.  He was on 10 L when she arrived and she has since asked for him to be weaned.  Currently on 7 L nasal cannula.  Social History: Patient has 1 daughter and 1 son, who lives in [place] and is less involved.  Per [name], son will not be participating in goals of care discussions and states he will not make decisions on patient's behalf.  Functional and Nutritional State: Patient is bedbound.  [name] reports that over the past few weeks, he has had more difficulty with his legs and becoming more contracted with pain and resistance to movements.  This was never the case before. He is dependent on tube feeds via PEG with albumin of 2.1 on 8/27.  Palliative Symptoms: Dyspnea  Advance Directives: Reviewed MOST form on file in [facility].  Code Status: Concepts specific to code status, artifical feeding and hydration, and rehospitalization were considered and discussed. Recommended consideration of DNR status, understanding evidenced-based poor outcomes in similar hospitalized patients, as the cause of the arrest is likely associated with chronic/terminal disease rather than a reversible acute cardio-pulmonary event.   Discussion: [name] is open to extra support from PMT and shares that her brother has disagreed with her decision making in the past regarding her mother.  Acknowledged the tremendous weight of having this responsibility on just her.   Patient has always been clear in the past that he  wants aggressive care and anything available to keep him alive.  If it were up to her, she would not want to put him through intubation and possibly not even a prolonged time on BiPAP. She would  also tell the medical team to stop compressions if she were to be informed of a cardiac arrest. She has a clear understanding of her own limits and what she thinks is best for the patient.  While she understands the above is aligned with a DNR/DNI, her primary concern is that if she consents to changing CODE STATUS her father may not receive all aggressive interventions indicated.  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  Overall, she feels like she has not been listened to throughout these months of patient's ongoing decline and feels that it is best for her to make these decisions as they become more imminent.   Offered to advocate for frequent updates throughout the day/night throughout the day/night on changes in his condition.  She tells me that she sleeps with her phone next to her pillow all night long long clinical course and would appreciate a call at any hour.  She is not entirely opposed to transition to comfort measures, it just depends on patient's clinical course.  It would be helpful to her if she could understand how things change with these live updates.   Discussed the importance of continued conversation with family and the medical providers regarding overall plan of care and treatment options, ensuring decisions are within the context of the patient's values and GOCs.   Questions and concerns were addressed.  Hard Choices booklet left for review. The family was encouraged to call with questions or concerns.  PMT will continue to support holistically.   SUMMARY OF RECOMMENDATIONS   -Continue full code/full scope treatment for now -Patient's daughter ultimately would not want him to be intubated, but she is concerned that he will not receive other aggressive interventions as indicated if any limitations were set on his care -Patient's daughter would like frequent updates with changes in his status, i.e oxygen requirements etc., to guide her  decision-making process. Discussed with nursing  -Ongoing goals of care discussions pending clinical course -Psychosocial and emotional support provided -PMT will continue to follow and support   Prognosis:  Poor  Discharge Planning: To Be Determined      Primary Diagnoses: Present on Admission: **None**  Physical Exam Vitals and nursing note reviewed.  Constitutional:      General: He is not in acute distress.    Appearance: He is ill-appearing.     Interventions: Nasal cannula in place.     Comments: 7L  Cardiovascular:     Rate and Rhythm: Normal rate.  Pulmonary:     Effort: Pulmonary effort is normal. No respiratory distress.     Comments: Excessive secretions Neurological:     Mental Status: He is unresponsive.     Vital Signs: BP 117/60 (BP Location: Left Arm)   Pulse 80   Temp 97.9 F (36.6 C) (Oral)   Resp 19   Ht 5\' 9"  (1.753 m)   Wt 81.6 kg   SpO2 97%   BMI 26.57 kg/m  Pain Scale: 0-10   Pain Score: 0-No pain   SpO2: SpO2: 97 % O2 Device:SpO2: 97 % O2 Flow Rate: .O2 Flow Rate (L/min): 7 L/min   Palliative Assessment/Data: 30% (on tube feeds)    MDM: high    Tryone Kille Jeni Salles, PA-C  Palliative  Medicine Team Team phone # [phone]  Thank you for allowing the Palliative Medicine Team to assist in the care of this patient. Please utilize secure chat with additional questions, if there is no response within 30 minutes please call the above phone number.  Palliative Medicine Team providers are available by phone from 7am to 7pm daily and can be reached through the team cell phone.  Should this patient require assistance outside of these hours, please call the patient's attending physician.

## 2022-11-01 NOTE — Progress Notes (Signed)
Pharmacy Antibiotic Note  Harry [name] is a 77 y.o. male admitted 10/30/22 with respiratory distress. Pharmacy has been consulted for Vancomycin and Zosyn dosing for aspiration pneumonia.  S/p Zosyn for aspiration pneumonia 8/17>>8/25, last admit. Discharged to SNF 8/27 and back to ED later that day.  Creatinine has trended up from baseline. Lasix 40 mg IV daily given 8/28 and 8/29 am, now discontinued.  Plan: Zosyn 3.375 gm IV over 30 minutes x 1, then q8h (each infused over 4 hours) Vancomycin 1500 mg IV x 1 loading dose Vancomycin 1250 mg IV Q 24 hrs to begin 8/30.  Goal AUC 400-550. Expected AUC: 535 SCr used: 1.5 Follow renal function, culture data, clinical progress and antibiotic plans. Narrow antibiotics as able.   Height: 5\' 9"  (175.3 cm) Weight: 81.6 kg (179 lb 14.3 oz) IBW/kg (Calculated) : 70.7  Temp (24hrs), Avg:98.2 F (36.8 C), Min:97.6 F (36.4 C), Max:100.1 F (37.8 C)  Recent Labs  Lab 10/28/22 0617 10/28/22 1942 10/29/22 1758 10/30/22 0458 10/30/22 1757 10/31/22 0651 10/31/22 0825 11/01/22 0313 11/01/22 1057  WBC 6.3   < > 4.9 6.3 6.8 7.5  --   --  10.7*  CREATININE 1.16  --   --  1.00 1.16  --  1.38* 1.50*  --    < > = values in this interval not displayed.    Estimated Creatinine Clearance: 42.6 mL/min (A) (by C-G formula based on SCr of 1.5 mg/dL (H)).    Allergies  Allergen Reactions   Lipitor [Atorvastatin] Other (See Comments)    Myalgias   Statins Other (See Comments)    Myalgias  Pt tolerating rosuvastatin 5mg  QD   Pravachol [Pravastatin] Rash    Antimicrobials this admission: Vancomycin 8/29 >> Zosyn 8/29 >>  Dose adjustments this admission: n/a  Microbiology results: 8/27 COVID, flu and RSV: negative 8/29 MRSA PCR: pending  Thank you for allowing pharmacy to be a part of this patient's care.  Dennie Fetters, [place] 11/01/2022 12:06 PM
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
## 2022-11-01 NOTE — Plan of Care (Signed)
  Problem: Clinical Measurements: Goal: Will remain free from infection Outcome: Progressing   Problem: Clinical Measurements: Goal: Diagnostic test results will improve Outcome: Progressing   Problem: Clinical Measurements: Goal: Respiratory complications will improve Outcome: Progressing   

## 2022-11-01 NOTE — IPAL (Signed)
  Interdisciplinary Goals of Care Family Meeting   Date carried out: 11/01/2022  Location of the meeting: Bedside  Member's involved: Physician, Nurse Practitioner, Bedside Registered Nurse, and Family Member or next of kin  Durable Power of Attorney or acting medical decision maker: Daughter  Discussion: We discussed goals of care for [employer].  She texted with her brother.  He wasn't able to commit to any plan of action and unclear if he will be able to travel from [place].  The daughter understands what is going on.  She would like to try antibiotics, supplemental oxygen, and bronchial hygiene.  She isn't ready to commit to DNR status at this time, but if things get worse she would like to be notified before any additional interventions are attempted.  She is agreeable to get palliative care back involved to assist with goals of care discussions.    Code status:   Code Status: Full Code   Disposition: Continue current acute care  Time spent for the meeting: 12 minutes    Coralyn Helling, MD  11/01/2022, 12:32 PM

## 2022-11-01 NOTE — Progress Notes (Addendum)
[facility] PROGRESS NOTE   [name] SWF:[id number] DOB: [birth year] DOA: 10/30/2022  PCP: Crist Fat, MD  Brief History/Interval Summary: 77 y.o. male with a known history of CVA, CKD, hypertension, hyperlipidemia, Lewy body dementia peripheral vascular disease, CVA and type 2 diabetes presents to the emergency department for evaluation of respiratory distress.  Patient was discharged from the hospital on 10/30/22 to [facility] after admission for anemia requiring transfusion, aspiration pneumonia and bilateral LE pressure ulcers.  He was at the facility for one hour and was found to be hypoxic into the 70s and in respiratory distress.  CPAP was started as well as a nebulizer.  Of note patient has indwelling foley and GTube.   Consultants: Pulmonology  Procedures: None yet    Subjective/Interval History: Patient noted to be somnolent this morning.  Did not receive any sedative medications per nursing staff.  Oxygen requirement is noted to be higher as well.      Assessment/Plan:  Acute respiratory failure with hypoxemia Was sent back from skilled nursing facility since he was noted to be hypoxic.  Initially on 3 L of oxygen via nasal cannula.  Chest x-ray suggested congestive heart failure.  Patient was hospitalized.   COVID-19, influenza and RSV PCR's were negative. Patient was given furosemide.  This morning he has increased oxygen requirements.  Chest x-ray was repeated which shows complete whiteout of the left lung.  He had a CT angiogram earlier this month which suggested debris in the tracheobronchial tree.  Patient likely has mucous plugging. Use Venti mask to be used to see if it improves oxygenation.  Discussed with pulmonology who will consult.  High risk for requiring intubation mechanical ventilation.  Daughter was notified and she would like for Harry [name] to continue full scope of care including intubation if necessary.  Acute on chronic diastolic  CHF Echocardiogram shows normal systolic function with grade 2 diastolic dysfunction.  Pleural effusions noted on chest x-ray.  Decompensation this morning.  Pulmonology to see.  May need to consider doing thoracentesis. Continue daily furosemide for now.    Chronic anemia/chronic GI blood loss Seems to be close to baseline. During recent hospitalization he was transfused PRBC.  There was concern for GI bleeding.  Gastroenterology was consulted who did not recommend any invasive studies due to high risk. Aspirin and Plavix were discontinued as he was having persistent dark tarry stools in the hospital. He was discharged on PPI and Carafate. Continue to monitor.  Essential hypertension Continue home medications.  Monitor blood pressures closely.  History of urinary retention with chronic indwelling Foley Continue Foley catheter.  Continue Flomax.  History of stroke Aspirin and Plavix were held due to GI bleed.  History of Lewy body dementia Has been stable.  Hyperlipidemia Continue statin.  History of diabetes Noted to be on glargine and SSI.  Monitor CBGs.  HbA1c 6.5 in March.  Chronic dysphagia with PEG tube Continue tube feedings.  Recent aspiration pneumonia Completed course of antibiotics.  Chronic kidney disease stage IIIa Renal function close to baseline.  DVT Prophylaxis: SCDs only for now.  Will discontinue Lovenox due to recent GI bleed. Code Status: Full code Family Communication: Daughter was informed Disposition Plan: SNF when medically stable  Status is: Inpatient Remains inpatient appropriate because: Acute respiratory failure with hypoxia      Medications: Scheduled:  amLODipine  5 mg Per Tube Daily   carvedilol  6.25 mg Per Tube BID WC   Chlorhexidine Gluconate Cloth  6 each Topical Daily   docusate  100 mg Per Tube BID   enoxaparin (LOVENOX) injection  40 mg Subcutaneous Q24H   ferrous gluconate  324 mg Oral QHS   free water  150 mL Per Tube  Q4H   furosemide  40 mg Intravenous Daily   hydrALAZINE  25 mg Per Tube Q8H   insulin aspart  0-9 Units Subcutaneous Q4H   insulin glargine-yfgn  13 Units Subcutaneous Daily   multivitamin with minerals  1 tablet Per Tube Daily   nystatin  15 mL Oral TID   pantoprazole  40 mg Oral BID AC   polyvinyl alcohol  1 drop Both Eyes TID   pyridostigmine  30 mg Per Tube Q8H   rosuvastatin  5 mg Per Tube QHS   sodium chloride flush  3 mL Intravenous Q12H   tamsulosin  0.4 mg Oral Daily   Continuous:  sodium chloride     feeding supplement (OSMOLITE 1.5 CAL) 1,000 mL (11/01/22 0120)   WJX:BJYNWG chloride, acetaminophen, ipratropium-albuterol, losartan, ondansetron (ZOFRAN) IV, sodium chloride flush  Antibiotics: Anti-infectives (From admission, onward)    None       Objective:  Vital Signs  Vitals:   10/31/22 2239 11/01/22 0112 11/01/22 0414 11/01/22 0819  BP: 125/68 (!) 119/55 134/61 (!) 116/49  Pulse:  87 90 94  Resp:  17 18 (!) 22  Temp:  98.1 F (36.7 C) 100.1 F (37.8 C) 97.9 F (36.6 C)  TempSrc:  Oral Axillary Oral  SpO2:  95% 96% 90%  Weight:   81.6 kg   Height:        Intake/Output Summary (Last 24 hours) at 11/01/2022 0924 Last data filed at 11/01/2022 0654 Gross per 24 hour  Intake 648.33 ml  Output 850 ml  Net -201.67 ml   Filed Weights   10/30/22 1748 11/01/22 0414  Weight: 82 kg 81.6 kg    General appearance: Somnolent.  Does not respond to voice commands. Resp: Tachypneic.  Decreased breath sounds on the left.  Crackles at the bases. Cardio: S1-S2 is normal regular.  No S3-S4.  No rubs murmurs or bruit GI: Abdomen is soft.  Nontender nondistended.  Bowel sounds are present normal.  No masses organomegaly.  PEG tube is noted. Extremities: No edema.     Lab Results:  Data Reviewed: I have personally reviewed following labs and reports of the imaging studies  CBC: Recent Labs  Lab 10/27/22 0601 10/28/22 0617 10/28/22 1942 10/29/22 1758  10/30/22 0458 10/30/22 1757 10/30/22 1810 10/31/22 0651  WBC 6.4 6.3 6.6 4.9 6.3 6.8  --  7.5  NEUTROABS 4.0 3.8  --  3.1 4.3 4.9  --   --   HGB 7.5* 6.7* 7.4* 7.2* 7.7* 7.1* 7.5* 7.6*  HCT 24.2* 22.4* 23.4* 23.5* 25.5* 23.0* 22.0* 24.3*  MCV 90.0 91.4 88.6 89.7 91.1 90.6  --  90.0  PLT 156 178 172 214 245 266  --  302    Basic Metabolic Panel: Recent Labs  Lab 10/28/22 0617 10/30/22 0458 10/30/22 1757 10/30/22 1810 10/31/22 0825 11/01/22 0313  NA 141 139 141 145 144 141  K 3.5 4.4 3.6 4.0 3.6 3.9  CL 108 103 107  --  109 105  CO2 26 23 23   --  26 27  GLUCOSE 192* 230* 230*  --  155* 203*  BUN 25* 23 29*  --  28* 33*  CREATININE 1.16 1.00 1.16  --  1.38* 1.50*  CALCIUM 8.1* 8.2*  8.1*  --  8.3* 7.9*    GFR: Estimated Creatinine Clearance: 42.6 mL/min (A) (by C-G formula based on SCr of 1.5 mg/dL (H)).  Liver Function Tests: Recent Labs  Lab 10/30/22 1757  AST 17  ALT 18  ALKPHOS 74  BILITOT 0.5  PROT 6.2*  ALBUMIN 2.1*    CBG: Recent Labs  Lab 10/30/22 1258 10/30/22 1821 11/01/22 0209 11/01/22 0416 11/01/22 0834  GLUCAP 242* 208* 210* 211* 198*    Lipid Profile: Recent Labs    10/31/22 0234  CHOL 68  HDL 19*  LDLCALC NOT CALCULATED  TRIG 80  CHOLHDL 3.6    Thyroid Function Tests: Recent Labs    10/31/22 0234  TSH 4.750*     Recent Results (from the past 240 hour(s))  Resp panel by RT-PCR (RSV, Flu A&B, Covid) Anterior Nasal Swab     Status: None   Collection Time: 10/30/22  5:47 PM   Specimen: Anterior Nasal Swab  Result Value Ref Range Status   SARS Coronavirus 2 by RT PCR NEGATIVE NEGATIVE Final   Influenza A by PCR NEGATIVE NEGATIVE Final   Influenza B by PCR NEGATIVE NEGATIVE Final    Comment: (NOTE) The Xpert Xpress SARS-CoV-2/FLU/RSV plus assay is intended as an aid in the diagnosis of influenza from Nasopharyngeal swab specimens and should not be used as a sole basis for treatment. Nasal washings and aspirates are  unacceptable for Xpert Xpress SARS-CoV-2/FLU/RSV testing.  Fact Sheet for Patients: BloggerCourse.com  Fact Sheet for Healthcare Providers: SeriousBroker.it  This test is not yet approved or cleared by the [place] FDA and has been authorized for detection and/or diagnosis of SARS-CoV-2 by FDA under an Emergency Use Authorization (EUA). This EUA will remain in effect (meaning this test can be used) for the duration of the COVID-19 declaration under Section 564(b)(1) of the Act, 21 U.S.C. section 360bbb-3(b)(1), unless the authorization is terminated or revoked.     Resp Syncytial Virus by PCR NEGATIVE NEGATIVE Final    Comment: (NOTE) Fact Sheet for Patients: BloggerCourse.com  Fact Sheet for Healthcare Providers: SeriousBroker.it  This test is not yet approved or cleared by the [place] FDA and has been authorized for detection and/or diagnosis of SARS-CoV-2 by FDA under an Emergency Use Authorization (EUA). This EUA will remain in effect (meaning this test can be used) for the duration of the COVID-19 declaration under Section 564(b)(1) of the Act, 21 U.S.C. section 360bbb-3(b)(1), unless the authorization is terminated or revoked.  Performed at [facility], [address], [place], [place] [address]       Radiology Studies: ECHOCARDIOGRAM COMPLETE  Result Date: 10/31/2022    ECHOCARDIOGRAM REPORT   Patient Name:   Harry [name] Date of Exam: 10/31/2022 Medical Rec #:  [id number]        Height:       69.0 in Accession #:    [id number]       Weight:       180.8 lb Date of Birth:  07-30-1945         BSA:          1.979 m Patient Age:    76 years         BP:           107/55 mmHg Patient Gender: M                HR:           87 bpm. Exam  Location:  Inpatient Procedure: 2D Echo, Cardiac Doppler, Color Doppler and Strain Analysis Indications:    Congestive Heart  Failure I50.9  History:        Patient has prior history of Echocardiogram examinations, most                 recent 09/11/2022. Stroke; Risk Factors:Dyslipidemia,                 Hypertension, Current Smoker and Diabetes.  Sonographer:    Eulah Pont RDCS Referring Phys: Tonye Royalty  Sonographer Comments: Global longitudinal strain was attempted. IMPRESSIONS  1. Left ventricular ejection fraction, by estimation, is 60 to 65%. The left ventricle has normal function. The left ventricle has no regional wall motion abnormalities. There is mild left ventricular hypertrophy. Left ventricular diastolic parameters are consistent with Grade II diastolic dysfunction (pseudonormalization). Elevated left atrial pressure.  2. Right ventricular systolic function is normal. The right ventricular size is mildly enlarged. There is mildly elevated pulmonary artery systolic pressure. The estimated right ventricular systolic pressure is 35.5 mmHg.  3. Moderate pleural effusion in the left lateral region.  4. The mitral valve is normal in structure. Trivial mitral valve regurgitation.  5. The aortic valve is tricuspid. Aortic valve regurgitation is not visualized. Aortic valve sclerosis/calcification is present, without any evidence of aortic stenosis.  6. The inferior vena cava is normal in size with greater than 50% respiratory variability, suggesting right atrial pressure of 3 mmHg. FINDINGS  Left Ventricle: Left ventricular ejection fraction, by estimation, is 60 to 65%. The left ventricle has normal function. The left ventricle has no regional wall motion abnormalities. The left ventricular internal cavity size was normal in size. There is  mild left ventricular hypertrophy. Left ventricular diastolic parameters are consistent with Grade II diastolic dysfunction (pseudonormalization). Elevated left atrial pressure. Right Ventricle: The right ventricular size is mildly enlarged. No increase in right ventricular wall  thickness. Right ventricular systolic function is normal. There is mildly elevated pulmonary artery systolic pressure. The tricuspid regurgitant velocity is 2.85 m/s, and with an assumed right atrial pressure of 3 mmHg, the estimated right ventricular systolic pressure is 35.5 mmHg. Left Atrium: Left atrial size was normal in size. Right Atrium: Right atrial size was normal in size. Pericardium: Trivial pericardial effusion is present. Mitral Valve: The mitral valve is normal in structure. Trivial mitral valve regurgitation. Tricuspid Valve: The tricuspid valve is normal in structure. Tricuspid valve regurgitation is mild. Aortic Valve: The aortic valve is tricuspid. Aortic valve regurgitation is not visualized. Aortic valve sclerosis/calcification is present, without any evidence of aortic stenosis. Pulmonic Valve: The pulmonic valve was not well visualized. Pulmonic valve regurgitation is not visualized. Aorta: The aortic root and ascending aorta are structurally normal, with no evidence of dilitation. Venous: The inferior vena cava is normal in size with greater than 50% respiratory variability, suggesting right atrial pressure of 3 mmHg. IAS/Shunts: The interatrial septum was not well visualized. Additional Comments: There is a moderate pleural effusion in the left lateral region.  LEFT VENTRICLE PLAX 2D LVIDd:         4.30 cm      Diastology LVIDs:         2.90 cm      LV e' medial:    5.55 cm/s LV PW:         1.30 cm      LV E/e' medial:  16.7 LV IVS:        1.30 cm  LV e' lateral:   8.10 cm/s LVOT diam:     2.10 cm      LV E/e' lateral: 11.5 LV SV:         86 LV SV Index:   44 LVOT Area:     3.46 cm  LV Volumes (MOD) LV vol d, MOD A2C: 120.0 ml LV vol d, MOD A4C: 86.6 ml LV vol s, MOD A2C: 46.0 ml LV vol s, MOD A4C: 38.2 ml LV SV MOD A2C:     74.0 ml LV SV MOD A4C:     86.6 ml LV SV MOD BP:      61.0 ml RIGHT VENTRICLE RV S prime:     11.00 cm/s TAPSE (M-mode): 1.9 cm LEFT ATRIUM             Index         RIGHT ATRIUM           Index LA diam:        3.50 cm 1.77 cm/m   RA Area:     16.90 cm LA Vol (A2C):   47.6 ml 24.05 ml/m  RA Volume:   43.80 ml  22.13 ml/m LA Vol (A4C):   50.6 ml 25.57 ml/m LA Biplane Vol: 49.7 ml 25.11 ml/m  AORTIC VALVE LVOT Vmax:   119.00 cm/s LVOT Vmean:  78.100 cm/s LVOT VTI:    0.249 m  AORTA Ao Root diam: 3.80 cm Ao Asc diam:  3.50 cm MITRAL VALVE               TRICUSPID VALVE MV Area (PHT): 4.49 cm    TR Peak grad:   32.5 mmHg MV Decel Time: 169 msec    TR Vmax:        285.00 cm/s MV E velocity: 92.90 cm/s MV A velocity: 82.70 cm/s  SHUNTS MV E/A ratio:  1.12        Systemic VTI:  0.25 m                            Systemic Diam: 2.10 cm Epifanio Lesches MD Electronically signed by Epifanio Lesches MD Signature Date/Time: 10/31/2022/12:36:48 PM    Final    DG Chest Port 1 View  Result Date: 10/30/2022 CLINICAL DATA:  Shortness of breath. EXAM: PORTABLE CHEST 1 VIEW COMPARISON:  October 27, 2022 FINDINGS: The heart size and mediastinal contours are within normal limits. Stable patchy moderate severity bilateral perihilar airspace opacities are seen, left greater than right. There is a small, partially layering right pleural effusion. A moderate size left pleural effusion is also noted. No pneumothorax is identified. The visualized skeletal structures are unremarkable. IMPRESSION: 1. Stable moderate severity bilateral perihilar airspace disease, left greater than right. 2. Bilateral pleural effusions, left greater than right. Electronically Signed   By: Aram Candela M.D.   On: 10/30/2022 19:39   CT Head Wo Contrast  Result Date: 10/30/2022 CLINICAL DATA:  Altered mental status EXAM: CT HEAD WITHOUT CONTRAST TECHNIQUE: Contiguous axial images were obtained from the base of the skull through the vertex without intravenous contrast. RADIATION DOSE REDUCTION: This exam was performed according to the departmental dose-optimization program which includes automated exposure  control, adjustment of the mA and/or kV according to patient size and/or use of iterative reconstruction technique. COMPARISON:  10/05/2022 FINDINGS: Brain: There is no acute hemorrhage, mass or extra-axial collection. There is periventricular hypoattenuation compatible with chronic microvascular disease. There are old  infarcts of the left occipital lobe and bilateral cerebellum with associated encephalomalacia. Mild generalized volume loss. Vascular: Atherosclerotic calcification of the internal carotid arteries at the skull base. No abnormal hyperdensity of the major intracranial arteries or dural venous sinuses. Skull: The visualized skull base, calvarium and extracranial soft tissues are normal. Sinuses/Orbits: No fluid levels or advanced mucosal thickening of the visualized paranasal sinuses. No mastoid or middle ear effusion. The orbits are normal. IMPRESSION: 1. No acute intracranial abnormality. 2. 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 Electronically Signed   By: Deatra Robinson M.D.   On: 10/30/2022 19:24       LOS: 2 days   Rhea Kaelin Rito Ehrlich  [facility] Pager on [email or web address]  11/01/2022, 9:24 AM

## 2022-11-02 ENCOUNTER — Inpatient Hospital Stay (HOSPITAL_COMMUNITY): Payer: Medicare Other

## 2022-11-02 DIAGNOSIS — J9811 Atelectasis: Secondary | ICD-10-CM | POA: Diagnosis not present

## 2022-11-02 DIAGNOSIS — J690 Pneumonitis due to inhalation of food and vomit: Secondary | ICD-10-CM | POA: Diagnosis not present

## 2022-11-02 DIAGNOSIS — I509 Heart failure, unspecified: Secondary | ICD-10-CM | POA: Diagnosis not present

## 2022-11-02 DIAGNOSIS — T17908A Unspecified foreign body in respiratory tract, part unspecified causing other injury, initial encounter: Secondary | ICD-10-CM

## 2022-11-02 DIAGNOSIS — T17998A Other foreign object in respiratory tract, part unspecified causing other injury, initial encounter: Secondary | ICD-10-CM | POA: Diagnosis not present

## 2022-11-02 DIAGNOSIS — J9601 Acute respiratory failure with hypoxia: Secondary | ICD-10-CM | POA: Diagnosis not present

## 2022-11-02 DIAGNOSIS — D649 Anemia, unspecified: Secondary | ICD-10-CM | POA: Diagnosis not present

## 2022-11-02 DIAGNOSIS — J189 Pneumonia, unspecified organism: Secondary | ICD-10-CM

## 2022-11-02 DIAGNOSIS — Z7189 Other specified counseling: Secondary | ICD-10-CM | POA: Diagnosis not present

## 2022-11-02 DIAGNOSIS — Y95 Nosocomial condition: Secondary | ICD-10-CM

## 2022-11-02 LAB — GLUCOSE, CAPILLARY
Glucose-Capillary: 110 mg/dL — ABNORMAL HIGH (ref 70–99)
Glucose-Capillary: 69 mg/dL — ABNORMAL LOW (ref 70–99)
Glucose-Capillary: 83 mg/dL (ref 70–99)
Glucose-Capillary: 87 mg/dL (ref 70–99)
Glucose-Capillary: 117 mg/dL — ABNORMAL HIGH (ref 70–99)
Glucose-Capillary: 130 mg/dL — ABNORMAL HIGH (ref 70–99)
Glucose-Capillary: 104 mg/dL — ABNORMAL HIGH (ref 70–99)
Glucose-Capillary: 135 mg/dL — ABNORMAL HIGH (ref 70–99)
Glucose-Capillary: 48 mg/dL — ABNORMAL LOW (ref 70–99)

## 2022-11-02 LAB — CBC
HCT: 21.2 % — ABNORMAL LOW (ref 39.0–52.0)
Hemoglobin: 6.6 g/dL — CL (ref 13.0–17.0)
MCH: 27.4 pg (ref 26.0–34.0)
MCHC: 31.1 g/dL (ref 30.0–36.0)
MCV: 88 fL (ref 80.0–100.0)
Platelets: 362 10*3/uL (ref 150–400)
RBC: 2.41 MIL/uL — ABNORMAL LOW (ref 4.22–5.81)
RDW: 17.5 % — ABNORMAL HIGH (ref 11.5–15.5)
WBC: 10.3 10*3/uL (ref 4.0–10.5)
nRBC: 0 % (ref 0.0–0.2)

## 2022-11-02 LAB — BASIC METABOLIC PANEL
Anion gap: 12 (ref 5–15)
BUN: 34 mg/dL — ABNORMAL HIGH (ref 8–23)
CO2: 25 mmol/L (ref 22–32)
Calcium: 8 mg/dL — ABNORMAL LOW (ref 8.9–10.3)
Chloride: 107 mmol/L (ref 98–111)
Creatinine, Ser: 1.62 mg/dL — ABNORMAL HIGH (ref 0.61–1.24)
GFR, Estimated: 44 mL/min — ABNORMAL LOW (ref >60)
Glucose, Bld: 123 mg/dL — ABNORMAL HIGH (ref 70–99)
Potassium: 4 mmol/L (ref 3.5–5.1)
Sodium: 144 mmol/L (ref 135–145)

## 2022-11-02 LAB — HEMOGLOBIN AND HEMATOCRIT, BLOOD
HCT: 25.1 % — ABNORMAL LOW (ref 39.0–52.0)
Hemoglobin: 8 g/dL — ABNORMAL LOW (ref 13.0–17.0)

## 2022-11-02 LAB — PREPARE RBC (CROSSMATCH)

## 2022-11-02 MED ORDER — DEXTROSE 50 % IV SOLN: 25.0000 g | INTRAVENOUS | Status: AC

## 2022-11-02 MED ORDER — DEXTROSE 50 % IV SOLN
INTRAVENOUS | Status: AC
  Administered 2022-11-02: 25 g via INTRAVENOUS
  Filled 2022-11-02: qty 50

## 2022-11-02 MED ORDER — VANCOMYCIN HCL 1750 MG/350ML IV SOLN: 1750.0000 mg | INTRAVENOUS | Status: DC

## 2022-11-02 MED ORDER — SODIUM CHLORIDE 0.9% IV SOLUTION: Freq: Once | INTRAVENOUS | Status: AC

## 2022-11-02 MED ORDER — NYSTATIN 100000 UNIT/ML MT SUSP
15.0000 mL | Freq: Three times a day (TID) | OROMUCOSAL | Status: DC
  Administered 2022-11-02 – 2022-11-04 (×7): 1500000 [IU] via OROMUCOSAL
  Administered 2022-11-04: 15 mL via OROMUCOSAL
  Administered 2022-11-05 – 2022-11-09 (×11): 1500000 [IU] via OROMUCOSAL
  Administered 2022-11-09: 500000 [IU] via OROMUCOSAL
  Administered 2022-11-10 – 2022-11-12 (×8): 1500000 [IU] via OROMUCOSAL
  Filled 2022-11-02 (×27): qty 15

## 2022-11-02 NOTE — Progress Notes (Signed)
Pharmacy Antibiotic Note  Harry [name] is a 77 y.o. male admitted 10/30/22 with respiratory distress. Pharmacy has been consulted for Vancomycin and Zosyn dosing for aspiration pneumonia.  S/p Zosyn for aspiration pneumonia 8/17>>8/25, last admit. Discharged to SNF 8/27 and back to ED later that day.  Creatinine has continued to trend up.  Plan: Zosyn 3.375 gm IV over 30 minutes x 1, then q8h (each infused over 4 hours) Change vancomycin to 1750 mg q 36 hrs for rising Scr Goal AUC 400-550. Expected AUC: 535 SCr used: 1.62 Follow renal function, culture data, clinical progress and antibiotic plans. Narrow antibiotics as able.   Height: 5\' 9"  (175.3 cm) Weight: 84.9 kg (187 lb 2.7 oz) IBW/kg (Calculated) : 70.7  Temp (24hrs), Avg:98.7 F (37.1 C), Min:97.5 F (36.4 C), Max:99.5 F (37.5 C)  Recent Labs  Lab 10/30/22 0458 10/30/22 1757 10/31/22 0651 10/31/22 0825 11/01/22 0313 11/01/22 1057 11/02/22 0728  WBC 6.3 6.8 7.5  --   --  10.7* 10.3  CREATININE 1.00 1.16  --  1.38* 1.50*  --  1.62*    Estimated Creatinine Clearance: 42.6 mL/min (A) (by C-G formula based on SCr of 1.62 mg/dL (H)).    Allergies  Allergen Reactions   Lipitor [Atorvastatin] Other (See Comments)    Myalgias   Statins Other (See Comments)    Myalgias  Pt tolerating rosuvastatin 5mg  QD   Pravachol [Pravastatin] Rash    Antimicrobials this admission: Vancomycin 8/29 >> Zosyn 8/29 >>  Dose adjustments this admission: n/a  Microbiology results: 8/27 COVID, flu and RSV: negative 8/29 MRSA PCR: pending  Thank you for allowing pharmacy to be a part of this patient's care.  Reece Leader, Colon Flattery, [facility] Clinical Pharmacist  11/02/2022 8:38 AM   [facility] pharmacy phone numbers are listed on [email or web address]

## 2022-11-02 NOTE — Progress Notes (Signed)
No CPT at this time. Pt asleep in NAD. Cont q4w/a

## 2022-11-02 NOTE — Care Management Important Message (Signed)
Important Message  Patient Details  Name: Harry [name] MRN: [id number] Date of Birth: Jun 01, 1945   Medicare Important Message Given:  Yes     Renie Ora 11/02/2022, 8:41 AM

## 2022-11-02 NOTE — Progress Notes (Addendum)
CCM Cross cover note  Paged by RN at direction of cross covering [facility] physician regarding pt incr O2 requirement -- RN/RRT have appropriately asked for order for HHFNC, and were directed to contact ccm regarding this.   I assessed the patient. There is no indication for ICU transfer at this time. Fine to try HHFNC and will put in an order. Goal SpO2 > 90.    Will call the daughter to notify of incr O2 req , but again, does not need ICU transfer at this time  Agree w recs for DNR/I. Sounds like these talks are ongoing    CCT n/a    Tessie Fass MSN, AGACNP-BC [facility] Pulmonary/Critical Care Medicine 11/02/2022, 1:52 AM

## 2022-11-02 NOTE — Progress Notes (Signed)
RN received a call from patient's daughter. She requested to have the patient's code status changed to "DNR". MD notified.

## 2022-11-02 NOTE — Plan of Care (Signed)

## 2022-11-02 NOTE — Progress Notes (Signed)
After receiving a bath patient's sats dropped on 6L salter. RN increased salter to 15L with NRB over it to achieve a sat of 97%. Provider made aware and an order was received for HHFNC. Pt placed on 45L and 70%, sats 90%. Pt NTS for a large amount of thick white-yellow secretions, sats 100% and was able to be weaned to 35L and 45%. Pt tolerating well and will continue to monitor.

## 2022-11-02 NOTE — Significant Event (Signed)
[facility] night coverage note:  Messaged by patients RN: "Just received a call from the patient's daughter. She wants to make him a DNR now."  Spoke with Harry [name], confirmed directly with her: DNR/DNI (No ET tube / ventilator) but otherwise full scope of care and interventions.  Will place order in chart.
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
## 2022-11-02 NOTE — Progress Notes (Signed)
Daily Progress Note   Patient Name: Harry [name]       Date: 11/02/2022 DOB: [birth year]  Age: 77 y.o. MRN#: [id number] Attending Physician: Osvaldo Shipper, MD Primary Care Physician: Leonia Reader, Barbara Cower, MD Admit Date: 10/30/2022  Reason for Consultation/Follow-up: Establishing goals of care  Subjective: Medical records reviewed including progress notes, labs, and imaging. Patient assessed at the bedside.  He is lethargic, resting his hand on his head but does not answer to my voice.  Currently on 25 L heated high flow nasal cannula.  Discussed with RN.  No impression to me visit.  Called patient's daughter [name] for ongoing palliative support and updates.  She notes that patient sounded much better after NTS and wonders if this has been done again.  She also wonders about bronchoscopy as an option for even further relief of mucus.    I relayed questions to nursing and pulmonology and then called her back.  Provided update that last NTS was around 2 AM last night.  Shared that pulmonary still has concerns about bronchoscopy and the risk of prolonged intubation if this were to be done.  She understands.  Answered [name]'s questions about the option of comfort focused care in the hospital, as well as transfer to residential hospice facility.  She is appreciative of the information and open to ongoing goals of care discussions pending clinical course.  She plans to come visit later this afternoon after picking up her son.  Questions and concerns addressed. PMT will continue to support holistically.   Length of Stay: 3   Physical Exam Vitals and nursing note reviewed.  Constitutional:      General: He is not in acute distress.    Appearance: He is ill-appearing.  Cardiovascular:     Rate and  Rhythm: Normal rate.  Pulmonary:     Effort: Pulmonary effort is normal.  Psychiatric:        Cognition and Memory: Cognition is impaired.             Vital Signs: BP (!) 130/59   Pulse 75   Temp 98.4 F (36.9 C) (Axillary)   Resp (!) 26   Ht 5\' 9"  (1.753 m)   Wt 84.9 kg   SpO2 96%   BMI 27.64 kg/m  SpO2: SpO2: 96 % O2 Device: O2  Device: High Flow Nasal Cannula O2 Flow Rate: O2 Flow Rate (L/min): 35 L/min      Palliative Assessment/Data: 30% (on tube feeds)   Palliative Care Assessment & Plan   Patient Profile: 77 y.o. male  with past medical history of Lewy body dementia, CVA, CKD, dysphagia with PEG, HTN, chronic foley, PVD, and DM  admitted on 10/30/2022 with respiratory distress.    Patient was recently discharged on 8/[facility] after hospitalization for acute anemia, aspiration pneumonia, bilateral pressure ulcers.  He returned for evaluation after 1 hour at the SNF due to hypoxia and respiratory symptoms.  He is now admitted for acute hypoxic respiratory failure secondary to new onset CHF.  Initial CXR was concerning for volume overload and heart failure though unfortunately, CXR on 8/29 showed complete whiteout of the left lung and concern for mucous plugging.  Patient would be high risk for prolonged mechanical ventilation after bronchoscopy.   Patient is known to PMT from prior hospitalizations.  He has been hospitalized 5 times in the past 6 months.  PMT has been consulted to assist with goals of care conversation with daughter's consent.  Assessment: Goals of care conversation Acute respiratory failure with hypoxemia Recurrent aspiration pneumonia Acute diastolic CHF  Recommendations/Plan: Continue DNR/DNI Continue full scope treatment Ongoing goals of care discussions pending clinical course Psychosocial and emotional support provided PMT will continue to follow and support   Prognosis: Poor  Discharge Planning: To Be Determined  Care plan was  discussed with patient's daughter, primary attending, CCM   Total time: I spent 35 minutes in the care of the patient today in the above activities and documenting the encounter.          Raidyn Wassink Jeni Salles, PA-C  Palliative Medicine Team Team phone # [phone]  Thank you for allowing the Palliative Medicine Team to assist in the care of this patient. Please utilize secure chat with additional questions, if there is no response within 30 minutes please call the above phone number.  Palliative Medicine Team providers are available by phone from 7am to 7pm daily and can be reached through the team cell phone.  Should this patient require assistance outside of these hours, please call the patient's attending physician.

## 2022-11-02 NOTE — Progress Notes (Signed)
[facility] PROGRESS NOTE   Harry [name] ZOX:[id number] DOB: Aug 16, 1945 DOA: 10/30/2022  PCP: Crist Fat, MD  Brief History/Interval Summary: 77 y.o. male with a known history of CVA, CKD, hypertension, hyperlipidemia, Lewy body dementia peripheral vascular disease, CVA and type 2 diabetes presents to the emergency department for evaluation of respiratory distress.  Patient was discharged from the hospital on 10/30/22 to [facility] after admission for anemia requiring transfusion, aspiration pneumonia and bilateral LE pressure ulcers.  He was at the facility for one hour and was found to be hypoxic into the 70s and in respiratory distress.  CPAP was started as well as a nebulizer.  Of note patient has indwelling foley and GTube.   Consultants: Pulmonology  Procedures: None yet    Subjective/Interval History: Patient remains poorly responsive this morning.  Overnight events noted.     Assessment/Plan:  Acute respiratory failure with hypoxemia/recurrent aspiration pneumonia Was sent back from skilled nursing facility since he was noted to be hypoxic.  Initially on 3 L of oxygen via nasal cannula.  Chest x-ray suggested congestive heart failure.  Patient was hospitalized.   COVID-19, influenza and RSV PCR's were negative. Patient was given furosemide.   On the morning of 8/29 patient was noted to be significantly hypoxic.  Chest x-ray showed whiteout of the left lung.  Pulmonology was consulted.  They have had multiple discussions with patient's daughter.  At this time supportive care has been provided.  No plans for procedures.  Patient's CODE STATUS was changed over to DNR/DNI last night by his daughter. Broad-spectrum antibiotic coverage was also initiated yesterday by pulmonology.  Patient is on vancomycin and Zosyn. Currently on heated high flow nasal cannula at 45% FiO2 and about 30 L/min.  Saturations are in the mid 90s. Patient likely experiencing mucous plugging  because of aspiration.  Furosemide has been discontinued for now.  Acute on chronic diastolic CHF Echocardiogram shows normal systolic function with grade 2 diastolic dysfunction.  Mild and then had mucous plugging.  Currently furosemide is on hold.    Chronic anemia/chronic GI blood loss During recent hospitalization he was transfused PRBC.  There was concern for GI bleeding.  Gastroenterology was consulted who did not recommend any invasive studies due to high risk. Aspirin and Plavix were discontinued as he was having persistent dark tarry stools in the hospital. He was discharged on PPI and Carafate. Hemoglobin has drifted down today.  1 unit of PRBC will be ordered.    Essential hypertension Continue to monitor closely.  History of urinary retention with chronic indwelling Foley Continue Foley catheter.  Continue Flomax.  History of stroke Aspirin and Plavix were held due to GI bleed.  History of Lewy body dementia Has been stable.  Hyperlipidemia Continue statin.  History of diabetes Noted to be on glargine and SSI.  Monitor CBGs.  HbA1c 6.5 in March.  Chronic dysphagia with PEG tube Feedings currently on hold due to aspiration  Chronic kidney disease stage IIIa Renal function close to baseline.  Goals of care Long discussions with patient's daughter by pulmonology and palliative medicine.  After much thought she has decided to transition patient to DNR/DNI last night.  Continue other care for now.  DVT Prophylaxis: SCDs only for now.  Code Status: Full code Family Communication: Daughter being updated daily. Disposition Plan: SNF when medically stable  Status is: Inpatient Remains inpatient appropriate because: Acute respiratory failure with hypoxia      Medications: Scheduled:  sodium chloride  Intravenous Once   albuterol  2.5 mg Nebulization Q4H   Chlorhexidine Gluconate Cloth  6 each Topical Daily   docusate  100 mg Per Tube BID   ferrous gluconate   324 mg Oral QHS   free water  150 mL Per Tube Q4H   guaiFENesin  15 mL Per Tube Q4H   insulin aspart  0-9 Units Subcutaneous Q4H   insulin glargine-yfgn  13 Units Subcutaneous Daily   multivitamin with minerals  1 tablet Per Tube Daily   nystatin  15 mL Oral TID   pantoprazole (PROTONIX) IV  40 mg Intravenous Q12H   polyvinyl alcohol  1 drop Both Eyes TID   pyridostigmine  30 mg Per Tube Q8H   rosuvastatin  5 mg Per Tube QHS   sodium chloride flush  3 mL Intravenous Q12H   sodium chloride HYPERTONIC  4 mL Nebulization BID   tamsulosin  0.4 mg Oral Daily   Continuous:  sodium chloride     dextrose 5 % and 0.45 % NaCl 75 mL/hr at 11/02/22 0900   feeding supplement (OSMOLITE 1.5 CAL) Stopped (11/01/22 1000)   piperacillin-tazobactam (ZOSYN)  IV Stopped (11/02/22 0851)   vancomycin     NWG:NFAOZH chloride, acetaminophen, albuterol, ipratropium-albuterol, ondansetron (ZOFRAN) IV, sodium chloride flush  Antibiotics: Anti-infectives (From admission, onward)    Start     Dose/Rate Route Frequency Ordered Stop   11/02/22 2300  vancomycin (VANCOREADY) IVPB 1750 mg/350 mL        1,750 mg 175 mL/hr over 120 Minutes Intravenous Every 24 hours 11/02/22 0837     11/02/22 1400  vancomycin (VANCOREADY) IVPB 1250 mg/250 mL  Status:  Discontinued        1,250 mg 166.7 mL/hr over 90 Minutes Intravenous Every 24 hours 11/01/22 1206 11/02/22 0837   11/01/22 2000  piperacillin-tazobactam (ZOSYN) IVPB 3.375 g        3.375 g 12.5 mL/hr over 240 Minutes Intravenous Every 8 hours 11/01/22 1206     11/01/22 1230  vancomycin (VANCOREADY) IVPB 1500 mg/300 mL        1,500 mg 150 mL/hr over 120 Minutes Intravenous  Once 11/01/22 1122 11/01/22 1517   11/01/22 1200  piperacillin-tazobactam (ZOSYN) IVPB 3.375 g        3.375 g 100 mL/hr over 30 Minutes Intravenous  Once 11/01/22 1122 11/01/22 1307       Objective:  Vital Signs  Vitals:   11/02/22 0700 11/02/22 0725 11/02/22 0744 11/02/22 0800  BP:  (!) 113/55  125/62 (!) 130/59  Pulse: 72 74 78 75  Resp: (!) 23 (!) 22 (!) 22 (!) 26  Temp:   98.4 F (36.9 C)   TempSrc:   Axillary   SpO2: 96% 95% 95% 96%  Weight:      Height:        Intake/Output Summary (Last 24 hours) at 11/02/2022 0912 Last data filed at 11/02/2022 0900 Gross per 24 hour  Intake 3935.35 ml  Output 550 ml  Net 3385.35 ml   Filed Weights   10/30/22 1748 11/01/22 0414 11/02/22 0438  Weight: 82 kg 81.6 kg 84.9 kg    General appearance: Somnolent.  Not very arousable. Resp: Diminished air entry bilaterally left more than right.  Crackles at the bases. Cardio: S1-S2 is normal regular.  No S3-S4.  No rubs murmurs or bruit GI: Abdomen is soft.  Nontender nondistended.  Bowel sounds are present normal.  No masses organomegaly.  PEG tube is noted.  Foley catheter  is noted. Extremities: No edema.    Lab Results:  Data Reviewed: I have personally reviewed following labs and reports of the imaging studies  CBC: Recent Labs  Lab 10/27/22 0601 10/28/22 0617 10/28/22 1942 10/29/22 1758 10/30/22 0458 10/30/22 1757 10/30/22 1810 10/31/22 0651 11/01/22 1057 11/02/22 0728  WBC 6.4 6.3   < > 4.9 6.3 6.8  --  7.5 10.7* 10.3  NEUTROABS 4.0 3.8  --  3.1 4.3 4.9  --   --   --   --   HGB 7.5* 6.7*   < > 7.2* 7.7* 7.1* 7.5* 7.6* 7.0* 6.6*  HCT 24.2* 22.4*   < > 23.5* 25.5* 23.0* 22.0* 24.3* 21.8* 21.2*  MCV 90.0 91.4   < > 89.7 91.1 90.6  --  90.0 86.5 88.0  PLT 156 178   < > 214 245 266  --  302 363 362   < > = values in this interval not displayed.    Basic Metabolic Panel: Recent Labs  Lab 10/30/22 0458 10/30/22 1757 10/30/22 1810 10/31/22 0825 11/01/22 0313 11/02/22 0728  NA 139 141 145 144 141 144  K 4.4 3.6 4.0 3.6 3.9 4.0  CL 103 107  --  109 105 107  CO2 23 23  --  26 27 25   GLUCOSE 230* 230*  --  155* 203* 123*  BUN 23 29*  --  28* 33* 34*  CREATININE 1.00 1.16  --  1.38* 1.50* 1.62*  CALCIUM 8.2* 8.1*  --  8.3* 7.9* 8.0*     GFR: Estimated Creatinine Clearance: 42.6 mL/min (A) (by C-G formula based on SCr of 1.62 mg/dL (H)).  Liver Function Tests: Recent Labs  Lab 10/30/22 1757  AST 17  ALT 18  ALKPHOS 74  BILITOT 0.5  PROT 6.2*  ALBUMIN 2.1*    CBG: Recent Labs  Lab 11/01/22 1755 11/01/22 1950 11/01/22 2056 11/02/22 0018 11/02/22 0446  GLUCAP 73 69* 83 87 110*    Lipid Profile: Recent Labs    10/31/22 0234  CHOL 68  HDL 19*  LDLCALC NOT CALCULATED  TRIG 80  CHOLHDL 3.6    Thyroid Function Tests: Recent Labs    10/31/22 0234  TSH 4.750*     Recent Results (from the past 240 hour(s))  Resp panel by RT-PCR (RSV, Flu A&B, Covid) Anterior Nasal Swab     Status: None   Collection Time: 10/30/22  5:47 PM   Specimen: Anterior Nasal Swab  Result Value Ref Range Status   SARS Coronavirus 2 by RT PCR NEGATIVE NEGATIVE Final   Influenza A by PCR NEGATIVE NEGATIVE Final   Influenza B by PCR NEGATIVE NEGATIVE Final    Comment: (NOTE) The Xpert Xpress SARS-CoV-2/FLU/RSV plus assay is intended as an aid in the diagnosis of influenza from Nasopharyngeal swab specimens and should not be used as a sole basis for treatment. Nasal washings and aspirates are unacceptable for Xpert Xpress SARS-CoV-2/FLU/RSV testing.  Fact Sheet for Patients: BloggerCourse.com  Fact Sheet for Healthcare Providers: SeriousBroker.it  This test is not yet approved or cleared by the [place] FDA and has been authorized for detection and/or diagnosis of SARS-CoV-2 by FDA under an Emergency Use Authorization (EUA). This EUA will remain in effect (meaning this test can be used) for the duration of the COVID-19 declaration under Section 564(b)(1) of the Act, 21 U.S.C. section 360bbb-3(b)(1), unless the authorization is terminated or revoked.     Resp Syncytial Virus by PCR NEGATIVE NEGATIVE Final  Comment: (NOTE) Fact Sheet for  Patients: BloggerCourse.com  Fact Sheet for Healthcare Providers: SeriousBroker.it  This test is not yet approved or cleared by the [place] FDA and has been authorized for detection and/or diagnosis of SARS-CoV-2 by FDA under an Emergency Use Authorization (EUA). This EUA will remain in effect (meaning this test can be used) for the duration of the COVID-19 declaration under Section 564(b)(1) of the Act, 21 U.S.C. section 360bbb-3(b)(1), unless the authorization is terminated or revoked.  Performed at [facility], [address] [place], [place] [address]   MRSA Next Gen by PCR, Nasal     Status: None   Collection Time: 11/01/22 11:17 AM   Specimen: Nasal Mucosa; Nasal Swab  Result Value Ref Range Status   MRSA by PCR Next Gen NOT DETECTED NOT DETECTED Final    Comment: (NOTE) The GeneXpert MRSA Assay (FDA approved for NASAL specimens only), is one component of a comprehensive MRSA colonization surveillance program. It is not intended to diagnose MRSA infection nor to guide or monitor treatment for MRSA infections. Test performance is not FDA approved in patients less than 73 years old. Performed at [facility], [address], [place], [place] [address]       Radiology Studies: DG CHEST PORT 1 VIEW  Result Date: 11/01/2022 CLINICAL DATA:  Hypoxia.  Shortness of breath. EXAM: PORTABLE CHEST 1 VIEW COMPARISON:  10/30/2022. FINDINGS: Since the prior study, there is new near complete opacification of left hemithorax. Findings may represent combination of left pleural effusion with collapse and/or consolidation. Small area of relative aeration is noted overlying the left mid lung zone. Findings are concerning for mucous plugging. Bronchoscopy is recommended. Redemonstration of diffuse alveolar and interstitial opacities overlying the right hemithorax without significant interval change. There is probable associated  layering right pleural effusion without significant interval change as well. No right pneumothorax. Evaluation of cardiomediastinal silhouette is nondiagnostic due to left hemithorax opacification. However, there is probable mediastinal shift to the left. No acute osseous abnormalities. The soft tissues are within normal limits. IMPRESSION: 1. New near complete opacification of the left hemithorax with probable mediastinal shift to the left. Findings are concerning for mucous plugging. Bronchoscopy is recommended. 2. Stable diffuse alveolar and interstitial opacities overlying the right hemithorax with probable associated layering right pleural effusion. Electronically Signed   By: Jules Schick M.D.   On: 11/01/2022 09:39   ECHOCARDIOGRAM COMPLETE  Result Date: 10/31/2022    ECHOCARDIOGRAM REPORT   Patient Name:   Harry [name] Date of Exam: 10/31/2022 Medical Rec #:  [id number]        Height:       69.0 in Accession #:    [id number]       Weight:       180.8 lb Date of Birth:  1946-02-09         BSA:          1.979 m Patient Age:    76 years         BP:           107/55 mmHg Patient Gender: M                HR:           87 bpm. Exam Location:  Inpatient Procedure: 2D Echo, Cardiac Doppler, Color Doppler and Strain Analysis Indications:    Congestive Heart Failure I50.9  History:        Patient has prior  history of Echocardiogram examinations, most                 recent 09/11/2022. Stroke; Risk Factors:Dyslipidemia,                 Hypertension, Current Smoker and Diabetes.  Sonographer:    Eulah Pont RDCS Referring Phys: Tonye Royalty  Sonographer Comments: Global longitudinal strain was attempted. IMPRESSIONS  1. Left ventricular ejection fraction, by estimation, is 60 to 65%. The left ventricle has normal function. The left ventricle has no regional wall motion abnormalities. There is mild left ventricular hypertrophy. Left ventricular diastolic parameters are consistent with Grade II diastolic  dysfunction (pseudonormalization). Elevated left atrial pressure.  2. Right ventricular systolic function is normal. The right ventricular size is mildly enlarged. There is mildly elevated pulmonary artery systolic pressure. The estimated right ventricular systolic pressure is 35.5 mmHg.  3. Moderate pleural effusion in the left lateral region.  4. The mitral valve is normal in structure. Trivial mitral valve regurgitation.  5. The aortic valve is tricuspid. Aortic valve regurgitation is not visualized. Aortic valve sclerosis/calcification is present, without any evidence of aortic stenosis.  6. The inferior vena cava is normal in size with greater than 50% respiratory variability, suggesting right atrial pressure of 3 mmHg. FINDINGS  Left Ventricle: Left ventricular ejection fraction, by estimation, is 60 to 65%. The left ventricle has normal function. The left ventricle has no regional wall motion abnormalities. The left ventricular internal cavity size was normal in size. There is  mild left ventricular hypertrophy. Left ventricular diastolic parameters are consistent with Grade II diastolic dysfunction (pseudonormalization). Elevated left atrial pressure. Right Ventricle: The right ventricular size is mildly enlarged. No increase in right ventricular wall thickness. Right ventricular systolic function is normal. There is mildly elevated pulmonary artery systolic pressure. The tricuspid regurgitant velocity is 2.85 m/s, and with an assumed right atrial pressure of 3 mmHg, the estimated right ventricular systolic pressure is 35.5 mmHg. Left Atrium: Left atrial size was normal in size. Right Atrium: Right atrial size was normal in size. Pericardium: Trivial pericardial effusion is present. Mitral Valve: The mitral valve is normal in structure. Trivial mitral valve regurgitation. Tricuspid Valve: The tricuspid valve is normal in structure. Tricuspid valve regurgitation is mild. Aortic Valve: The aortic valve is  tricuspid. Aortic valve regurgitation is not visualized. Aortic valve sclerosis/calcification is present, without any evidence of aortic stenosis. Pulmonic Valve: The pulmonic valve was not well visualized. Pulmonic valve regurgitation is not visualized. Aorta: The aortic root and ascending aorta are structurally normal, with no evidence of dilitation. Venous: The inferior vena cava is normal in size with greater than 50% respiratory variability, suggesting right atrial pressure of 3 mmHg. IAS/Shunts: The interatrial septum was not well visualized. Additional Comments: There is a moderate pleural effusion in the left lateral region.  LEFT VENTRICLE PLAX 2D LVIDd:         4.30 cm      Diastology LVIDs:         2.90 cm      LV e' medial:    5.55 cm/s LV PW:         1.30 cm      LV E/e' medial:  16.7 LV IVS:        1.30 cm      LV e' lateral:   8.10 cm/s LVOT diam:     2.10 cm      LV E/e' lateral: 11.5 LV SV:  86 LV SV Index:   44 LVOT Area:     3.46 cm  LV Volumes (MOD) LV vol d, MOD A2C: 120.0 ml LV vol d, MOD A4C: 86.6 ml LV vol s, MOD A2C: 46.0 ml LV vol s, MOD A4C: 38.2 ml LV SV MOD A2C:     74.0 ml LV SV MOD A4C:     86.6 ml LV SV MOD BP:      61.0 ml RIGHT VENTRICLE RV S prime:     11.00 cm/s TAPSE (M-mode): 1.9 cm LEFT ATRIUM             Index        RIGHT ATRIUM           Index LA diam:        3.50 cm 1.77 cm/m   RA Area:     16.90 cm LA Vol (A2C):   47.6 ml 24.05 ml/m  RA Volume:   43.80 ml  22.13 ml/m LA Vol (A4C):   50.6 ml 25.57 ml/m LA Biplane Vol: 49.7 ml 25.11 ml/m  AORTIC VALVE LVOT Vmax:   119.00 cm/s LVOT Vmean:  78.100 cm/s LVOT VTI:    0.249 m  AORTA Ao Root diam: 3.80 cm Ao Asc diam:  3.50 cm MITRAL VALVE               TRICUSPID VALVE MV Area (PHT): 4.49 cm    TR Peak grad:   32.5 mmHg MV Decel Time: 169 msec    TR Vmax:        285.00 cm/s MV E velocity: 92.90 cm/s MV A velocity: 82.70 cm/s  SHUNTS MV E/A ratio:  1.12        Systemic VTI:  0.25 m                            Systemic  Diam: 2.10 cm Epifanio Lesches MD Electronically signed by Epifanio Lesches MD Signature Date/Time: 10/31/2022/12:36:48 PM    Final        LOS: 3 days   Osvaldo Shipper  [facility] Pager on [email or web address]  11/02/2022, 9:12 AM

## 2022-11-03 DIAGNOSIS — D649 Anemia, unspecified: Secondary | ICD-10-CM | POA: Diagnosis not present

## 2022-11-03 DIAGNOSIS — J9601 Acute respiratory failure with hypoxia: Secondary | ICD-10-CM | POA: Diagnosis not present

## 2022-11-03 DIAGNOSIS — J690 Pneumonitis due to inhalation of food and vomit: Secondary | ICD-10-CM | POA: Diagnosis not present

## 2022-11-03 DIAGNOSIS — Z7189 Other specified counseling: Secondary | ICD-10-CM | POA: Diagnosis not present

## 2022-11-03 DIAGNOSIS — T17998A Other foreign object in respiratory tract, part unspecified causing other injury, initial encounter: Secondary | ICD-10-CM | POA: Diagnosis not present

## 2022-11-03 DIAGNOSIS — I509 Heart failure, unspecified: Secondary | ICD-10-CM | POA: Diagnosis not present

## 2022-11-03 LAB — GLUCOSE, CAPILLARY
Glucose-Capillary: 120 mg/dL — ABNORMAL HIGH (ref 70–99)
Glucose-Capillary: 128 mg/dL — ABNORMAL HIGH (ref 70–99)
Glucose-Capillary: 135 mg/dL — ABNORMAL HIGH (ref 70–99)
Glucose-Capillary: 135 mg/dL — ABNORMAL HIGH (ref 70–99)
Glucose-Capillary: 50 mg/dL — ABNORMAL LOW (ref 70–99)
Glucose-Capillary: 73 mg/dL (ref 70–99)
Glucose-Capillary: 84 mg/dL (ref 70–99)

## 2022-11-03 LAB — BASIC METABOLIC PANEL
Anion gap: 13 (ref 5–15)
BUN: 27 mg/dL — ABNORMAL HIGH (ref 8–23)
CO2: 24 mmol/L (ref 22–32)
Calcium: 8 mg/dL — ABNORMAL LOW (ref 8.9–10.3)
Chloride: 105 mmol/L (ref 98–111)
Creatinine, Ser: 1.5 mg/dL — ABNORMAL HIGH (ref 0.61–1.24)
GFR, Estimated: 48 mL/min — ABNORMAL LOW (ref >60)
Glucose, Bld: 71 mg/dL (ref 70–99)
Potassium: 3.6 mmol/L (ref 3.5–5.1)
Sodium: 142 mmol/L (ref 135–145)

## 2022-11-03 LAB — CBC
HCT: 24.6 % — ABNORMAL LOW (ref 39.0–52.0)
Hemoglobin: 7.8 g/dL — ABNORMAL LOW (ref 13.0–17.0)
MCH: 27.3 pg (ref 26.0–34.0)
MCHC: 31.7 g/dL (ref 30.0–36.0)
MCV: 86 fL (ref 80.0–100.0)
Platelets: 379 10*3/uL (ref 150–400)
RBC: 2.86 MIL/uL — ABNORMAL LOW (ref 4.22–5.81)
RDW: 17.8 % — ABNORMAL HIGH (ref 11.5–15.5)
WBC: 9 10*3/uL (ref 4.0–10.5)
nRBC: 0 % (ref 0.0–0.2)

## 2022-11-03 MED ORDER — OSMOLITE 1.5 CAL PO LIQD
1000.0000 mL | ORAL | Status: DC
  Filled 2022-11-03: qty 1000

## 2022-11-03 MED ORDER — PROSOURCE TF20 ENFIT COMPATIBL EN LIQD
60.0000 mL | Freq: Every day | ENTERAL | Status: DC
  Administered 2022-11-03 – 2022-11-12 (×10): 60 mL
  Filled 2022-11-03 (×10): qty 60

## 2022-11-03 MED ORDER — JEVITY 1.5 CAL/FIBER PO LIQD
1000.0000 mL | ORAL | Status: DC
  Administered 2022-11-03: 1000 mL
  Filled 2022-11-03: qty 1000

## 2022-11-03 MED ORDER — DEXTROSE 50 % IV SOLN
25.0000 g | INTRAVENOUS | Status: AC
  Administered 2022-11-03: 25 g via INTRAVENOUS
  Filled 2022-11-03: qty 50

## 2022-11-03 MED ORDER — JEVITY 1.5 CAL/FIBER PO LIQD
1000.0000 mL | ORAL | Status: DC
  Administered 2022-11-03 – 2022-11-05 (×2): 1000 mL
  Filled 2022-11-03 (×4): qty 1000

## 2022-11-03 MED ORDER — DEXTROSE 10 % IV SOLN: INTRAVENOUS | Status: DC

## 2022-11-03 NOTE — Progress Notes (Signed)
Pt asleep at this time. No cpt performed per w/a order.

## 2022-11-03 NOTE — Progress Notes (Signed)
Patient's daughter called- provided with update.

## 2022-11-03 NOTE — Progress Notes (Signed)
Hypoglycemic Event  CBG: 48  Treatment: D50 50 mL (25 gm)  Symptoms: None  Follow-up CBG: Time:2059 CBG Result:135  Possible Reasons for Event: Inadequate meal intake due to feeding on hold for aspiration pna  Comments/MD notified:Dr. Rosina Lowenstein

## 2022-11-03 NOTE — Progress Notes (Signed)
Daily Progress Note   Patient Name: Harry [name]       Date: 11/03/2022 DOB: [birth year]  Age: 77 y.o. MRN#: [id number] Attending Physician: Osvaldo Shipper, MD Primary Care Physician: Leonia Reader, Barbara Cower, MD Admit Date: 10/30/2022  Reason for Consultation/Follow-up: Establishing goals of care  Subjective: Medical records reviewed including progress notes, labs, and imaging. Patient assessed at the bedside. He moves his extremities to my voice but does not open his eyes or respond. No family present during my visit.  Called patient's daughter for ongoing goals of care discussions, palliative support. Provided with update including plan to resume tube feeds today given hypoglycemia. She is very concerned about this. She shares her concern about patient's recurrent aspiration within days of tube feeds reaching goal rates and that he has only gotten worsen since PEG was placed. Furthermore, she reports he did not have any GI bleeding before this time either and she fears it could be related.  She still feels that she is not being heard, as she has previously discussed preference for post-pyloric placement with pushback from IR. [name] also wonders if TPN could be considered until he improves from this acute illness. She is exhausted from navigating his ongoing decline. Provided emotional support and therapeutic listening, offering to advocate for her and speak with the care team.  Discussed with MD and RD via secure chat. Plan is to keep feeds at lower rate for a few days and monitor closely. Consider IR consult when oxygen requirements have improved, as he not currently stable enough for a procedure. Concern for increased risk of infection with TPN as well.  Questions and concerns addressed. PMT will  continue to support holistically.   Length of Stay: 4   Physical Exam Vitals and nursing note reviewed.  Constitutional:      General: He is not in acute distress.    Appearance: He is ill-appearing.  Cardiovascular:     Rate and Rhythm: Normal rate.  Pulmonary:     Effort: Pulmonary effort is normal.  Psychiatric:        Cognition and Memory: Cognition is impaired.             Vital Signs: BP (!) 163/86   Pulse 94   Temp 97.7 F (36.5 C) (Oral)   Resp (!) 25  Ht 5\' 9"  (1.753 m)   Wt 84.1 kg   SpO2 93%   BMI 27.38 kg/m  SpO2: SpO2: 93 % O2 Device: O2 Device: High Flow Nasal Cannula O2 Flow Rate: O2 Flow Rate (L/min): 35 L/min      Palliative Assessment/Data: 30% (on tube feeds)   Palliative Care Assessment & Plan   Patient Profile: 77 y.o. male  with past medical history of Lewy body dementia, CVA, CKD, dysphagia with PEG, HTN, chronic foley, PVD, and DM  admitted on 10/30/2022 with respiratory distress.    Patient was recently discharged on 8/27 to [facility] after hospitalization for acute anemia, aspiration pneumonia, bilateral pressure ulcers.  He returned for evaluation after 1 hour at the SNF due to hypoxia and respiratory symptoms.  He is now admitted for acute hypoxic respiratory failure secondary to new onset CHF.  Initial CXR was concerning for volume overload and heart failure though unfortunately, CXR on 8/29 showed complete whiteout of the left lung and concern for mucous plugging.  Patient would be high risk for prolonged mechanical ventilation after bronchoscopy.   Patient is known to PMT from prior hospitalizations.  He has been hospitalized 5 times in the past 6 months.  PMT has been consulted to assist with goals of care conversation with daughter's consent.  Assessment: Goals of care conversation Acute respiratory failure with hypoxemia Recurrent aspiration pneumonia Acute diastolic CHF  Recommendations/Plan: Continue DNR/DNI Continue full  scope treatment Ongoing goals of care discussions pending clinical course Psychosocial and emotional support provided PMT will continue to follow and support   Prognosis: Poor  Discharge Planning: To Be Determined  Care plan was discussed with patient's daughter, primary attending, RD   Total time: I spent 50 minutes in the care of the patient today in the above activities and documenting the encounter.          Baylyn Sickles Jeni Salles, PA-C  Palliative Medicine Team Team phone # [phone]  Thank you for allowing the Palliative Medicine Team to assist in the care of this patient. Please utilize secure chat with additional questions, if there is no response within 30 minutes please call the above phone number.  Palliative Medicine Team providers are available by phone from 7am to 7pm daily and can be reached through the team cell phone.  Should this patient require assistance outside of these hours, please call the patient's attending physician.

## 2022-11-03 NOTE — Progress Notes (Addendum)
1478: CBG 50. D10 currently infusing. No prn dextrose order. MD paged.  0725: Received call from MD- instructed to give half amp d50.  Per hypoglycemic protocol- order to give entire amp of D50. 0801: Rechecked blood sugar= 153.

## 2022-11-03 NOTE — Progress Notes (Signed)
CPT being performed at this time, will administer medications afterwards. Received secure chat message from provider "Okay to resume tube feedings at 25 mL/h. I have changed the orders. Please recheck his CBG. Thanks "  Provider informed repeat CBG from this morning was 153. Will resume tube feeds after CPT.

## 2022-11-03 NOTE — Progress Notes (Addendum)
1107:Jevity tube feeding started at 41mL/hr. CBG 120.  1325: Received secure chat for dietitian. Informed Jevity will be continuous at 19mL/hr- no need to titrate up to 60. Plan to re-evaluate Monday.

## 2022-11-03 NOTE — Progress Notes (Signed)
[facility] PROGRESS NOTE   ROBIN BABINEC ZOX:[id number] DOB: 1945-07-21 DOA: 10/30/2022  PCP: Crist Fat, MD  Brief History/Interval Summary: 77 y.o. male with a known history of CVA, CKD, hypertension, hyperlipidemia, Lewy body dementia peripheral vascular disease, CVA and type 2 diabetes presents to the emergency department for evaluation of respiratory distress.  Patient was discharged from the hospital on 10/30/22 to [facility] after admission for anemia requiring transfusion, aspiration pneumonia and bilateral LE pressure ulcers.  He was at the facility for one hour and was found to be hypoxic into the 70s and in respiratory distress.  CPAP was started as well as a nebulizer.  Of note patient has indwelling foley and GTube.   Consultants: Pulmonology.  Palliative care  Procedures: None yet    Subjective/Interval History: Patient noted to be awake this morning.  Very confused.  Low glucose levels from last night noted.     Assessment/Plan:  Acute respiratory failure with hypoxemia/recurrent aspiration pneumonia Was sent back from skilled nursing facility since he was noted to be hypoxic.  Initially on 3 L of oxygen via nasal cannula.  Chest x-ray suggested congestive heart failure.  Patient was hospitalized.   COVID-19, influenza and RSV PCR's were negative. Patient was given furosemide.   On the morning of 8/29 patient was noted to be significantly hypoxic.  Chest x-ray showed whiteout of the left lung.  Pulmonology was consulted.  They have had multiple discussions with patient's daughter.  At this time supportive care has been provided.  No plans for procedures.  Patient's CODE STATUS was changed over to DNR/DNI by his daughter. Broad-spectrum antibiotic coverage was also initiated by pulmonology.  Patient is on vancomycin and Zosyn. Currently patient is only on Zosyn. Chest x-ray 8/30 and shows improvement in the aeration of the left lung. Still requiring heated  high flow oxygen by nasal cannula.  Continue to wean down to maintain saturations greater than 90%. Patient likely experiencing mucous plugging because of aspiration.  Furosemide has been discontinued for now.  Acute on chronic diastolic CHF Echocardiogram shows normal systolic function with grade 2 diastolic dysfunction.  Mild and then had mucous plugging.  Currently furosemide is on hold.    Chronic anemia/chronic GI blood loss During recent hospitalization he was transfused PRBC.  There was concern for GI bleeding.  Gastroenterology was consulted who did not recommend any invasive studies due to high risk. Aspirin and Plavix were discontinued as he was having persistent dark tarry stools in the hospital. He was discharged on PPI and Carafate. Hemoglobin drifted down to 6.6 yesterday.  He was transfused 1 unit of PRBC.  Improvement in hemoglobin noted.  Continue to monitor.  No evidence of overt bleeding.   Essential hypertension Continue to monitor closely.  Occasional high readings noted.  History of urinary retention with chronic indwelling Foley Continue Foley catheter.  Continue Flomax.  History of stroke Aspirin and Plavix has been held since previous admission due to GI bleed.  History of Lewy body dementia/acute metabolic encephalopathy Patient was quite encephalopathic but seems to be better this morning.  Hyperlipidemia Continue statin.  History of diabetes HbA1c 6.5 in March.  Low glucose levels noted.  Likely due to tube feedings being held.  Started on D10 infusion overnight.  Will resume tube feedings.  Chronic dysphagia with PEG tube Tube feedings were held due to aspiration.  Seems to be doing better.  Will be resumed.  Chronic kidney disease stage IIIa Renal function close  to baseline.  Goals of care Long discussions with patient's daughter by pulmonology and palliative medicine.  After much thought she has decided to transition patient to DNR/DNI.  Continue  other care for now.  DVT Prophylaxis: SCDs only for now.  Code Status: Full code Family Communication: Daughter being updated daily. Disposition Plan: SNF when medically stable  Status is: Inpatient Remains inpatient appropriate because: Acute respiratory failure with hypoxia      Medications: Scheduled:  albuterol  2.5 mg Nebulization Q4H   Chlorhexidine Gluconate Cloth  6 each Topical Daily   docusate  100 mg Per Tube BID   ferrous gluconate  324 mg Oral QHS   free water  150 mL Per Tube Q4H   guaiFENesin  15 mL Per Tube Q4H   insulin aspart  0-9 Units Subcutaneous Q4H   multivitamin with minerals  1 tablet Per Tube Daily   nystatin  15 mL Mouth/Throat TID   pantoprazole (PROTONIX) IV  40 mg Intravenous Q12H   polyvinyl alcohol  1 drop Both Eyes TID   pyridostigmine  30 mg Per Tube Q8H   rosuvastatin  5 mg Per Tube QHS   sodium chloride flush  3 mL Intravenous Q12H   sodium chloride HYPERTONIC  4 mL Nebulization BID   tamsulosin  0.4 mg Oral Daily   Continuous:  sodium chloride     dextrose 50 mL/hr at 11/03/22 0641   feeding supplement (OSMOLITE 1.5 CAL) Stopped (11/01/22 1000)   piperacillin-tazobactam (ZOSYN)  IV 3.375 g (11/03/22 0459)   WGN:FAOZHY chloride, acetaminophen, albuterol, ipratropium-albuterol, ondansetron (ZOFRAN) IV, sodium chloride flush  Antibiotics: Anti-infectives (From admission, onward)    Start     Dose/Rate Route Frequency Ordered Stop   11/02/22 2300  vancomycin (VANCOREADY) IVPB 1750 mg/350 mL  Status:  Discontinued        1,750 mg 175 mL/hr over 120 Minutes Intravenous Every 24 hours 11/02/22 0837 11/02/22 1252   11/02/22 1400  vancomycin (VANCOREADY) IVPB 1250 mg/250 mL  Status:  Discontinued        1,250 mg 166.7 mL/hr over 90 Minutes Intravenous Every 24 hours 11/01/22 1206 11/02/22 0837   11/01/22 2000  piperacillin-tazobactam (ZOSYN) IVPB 3.375 g        3.375 g 12.5 mL/hr over 240 Minutes Intravenous Every 8 hours 11/01/22 1206      11/01/22 1230  vancomycin (VANCOREADY) IVPB 1500 mg/300 mL        1,500 mg 150 mL/hr over 120 Minutes Intravenous  Once 11/01/22 1122 11/01/22 1517   11/01/22 1200  piperacillin-tazobactam (ZOSYN) IVPB 3.375 g        3.375 g 100 mL/hr over 30 Minutes Intravenous  Once 11/01/22 1122 11/01/22 1307       Objective:  Vital Signs  Vitals:   11/03/22 0600 11/03/22 0700 11/03/22 0732 11/03/22 0800  BP: (!) 164/86 (!) 160/78  (!) 163/86  Pulse: 91 92 92 94  Resp: 17 (!) 28 (!) 23 (!) 25  Temp:  97.7 F (36.5 C)    TempSrc:  Oral    SpO2: 95% 91% 96% 93%  Weight:      Height:        Intake/Output Summary (Last 24 hours) at 11/03/2022 0901 Last data filed at 11/03/2022 0700 Gross per 24 hour  Intake 2622.52 ml  Output 1200 ml  Net 1422.52 ml   Filed Weights   11/01/22 0414 11/02/22 0438 11/03/22 0500  Weight: 81.6 kg 84.9 kg 84.1 kg   General  appearance: Noted to be awake alert this morning but very confused and distracted. Resp: Mildly tachypneic.  Improvement in aeration on the left side.  Few crackles at the bases. Cardio: S1-S2 is normal regular.  No S3-S4.  No rubs murmurs or bruit GI: Abdomen is soft.  Nontender nondistended.  Bowel sounds are present normal.  No masses organomegaly.  PEG tube is noted.  Foley catheter is noted Extremities: No edema.   More awake and alert today compared to yesterday.   Lab Results:  Data Reviewed: I have personally reviewed following labs and reports of the imaging studies  CBC: Recent Labs  Lab 10/28/22 0617 10/28/22 1942 10/29/22 1758 10/30/22 0458 10/30/22 1757 10/30/22 1810 10/31/22 0651 11/01/22 1057 11/02/22 0728 11/02/22 1438 11/03/22 0303  WBC 6.3   < > 4.9 6.3 6.8  --  7.5 10.7* 10.3  --  9.0  NEUTROABS 3.8  --  3.1 4.3 4.9  --   --   --   --   --   --   HGB 6.7*   < > 7.2* 7.7* 7.1*   < > 7.6* 7.0* 6.6* 8.0* 7.8*  HCT 22.4*   < > 23.5* 25.5* 23.0*   < > 24.3* 21.8* 21.2* 25.1* 24.6*  MCV 91.4   < > 89.7 91.1  90.6  --  90.0 86.5 88.0  --  86.0  PLT 178   < > 214 245 266  --  302 363 362  --  379   < > = values in this interval not displayed.    Basic Metabolic Panel: Recent Labs  Lab 10/30/22 1757 10/30/22 1810 10/31/22 0825 11/01/22 0313 11/02/22 0728 11/03/22 0303  NA 141 145 144 141 144 142  K 3.6 4.0 3.6 3.9 4.0 3.6  CL 107  --  109 105 107 105  CO2 23  --  26 27 25 24   GLUCOSE 230*  --  155* 203* 123* 71  BUN 29*  --  28* 33* 34* 27*  CREATININE 1.16  --  1.38* 1.50* 1.62* 1.50*  CALCIUM 8.1*  --  8.3* 7.9* 8.0* 8.0*    GFR: Estimated Creatinine Clearance: 42.6 mL/min (A) (by C-G formula based on SCr of 1.5 mg/dL (H)).  Liver Function Tests: Recent Labs  Lab 10/30/22 1757  AST 17  ALT 18  ALKPHOS 74  BILITOT 0.5  PROT 6.2*  ALBUMIN 2.1*    CBG: Recent Labs  Lab 11/02/22 2030 11/02/22 2103 11/03/22 0024 11/03/22 0447 11/03/22 0717  GLUCAP 48* 135* 84 73 50*     Recent Results (from the past 240 hour(s))  Resp panel by RT-PCR (RSV, Flu A&B, Covid) Anterior Nasal Swab     Status: None   Collection Time: 10/30/22  5:47 PM   Specimen: Anterior Nasal Swab  Result Value Ref Range Status   SARS Coronavirus 2 by RT PCR NEGATIVE NEGATIVE Final   Influenza A by PCR NEGATIVE NEGATIVE Final   Influenza B by PCR NEGATIVE NEGATIVE Final    Comment: (NOTE) The Xpert Xpress SARS-CoV-2/FLU/RSV plus assay is intended as an aid in the diagnosis of influenza from Nasopharyngeal swab specimens and should not be used as a sole basis for treatment. Nasal washings and aspirates are unacceptable for Xpert Xpress SARS-CoV-2/FLU/RSV testing.  Fact Sheet for Patients: BloggerCourse.com  Fact Sheet for Healthcare Providers: SeriousBroker.it  This test is not yet approved or cleared by the [place] FDA and has been authorized for detection and/or diagnosis of  SARS-CoV-2 by FDA under an Emergency Use Authorization (EUA).  This EUA will remain in effect (meaning this test can be used) for the duration of the COVID-19 declaration under Section 564(b)(1) of the Act, 21 U.S.C. section 360bbb-3(b)(1), unless the authorization is terminated or revoked.     Resp Syncytial Virus by PCR NEGATIVE NEGATIVE Final    Comment: (NOTE) Fact Sheet for Patients: BloggerCourse.com  Fact Sheet for Healthcare Providers: SeriousBroker.it  This test is not yet approved or cleared by the [place] FDA and has been authorized for detection and/or diagnosis of SARS-CoV-2 by FDA under an Emergency Use Authorization (EUA). This EUA will remain in effect (meaning this test can be used) for the duration of the COVID-19 declaration under Section 564(b)(1) of the Act, 21 U.S.C. section 360bbb-3(b)(1), unless the authorization is terminated or revoked.  Performed at [facility], [address] [place], [place] [address]   MRSA Next Gen by PCR, Nasal     Status: None   Collection Time: 11/01/22 11:17 AM   Specimen: Nasal Mucosa; Nasal Swab  Result Value Ref Range Status   MRSA by PCR Next Gen NOT DETECTED NOT DETECTED Final    Comment: (NOTE) The GeneXpert MRSA Assay (FDA approved for NASAL specimens only), is one component of a comprehensive MRSA colonization surveillance program. It is not intended to diagnose MRSA infection nor to guide or monitor treatment for MRSA infections. Test performance is not FDA approved in patients less than 16 years old. Performed at [facility], [address], [place], [place] [address]       Radiology Studies: DG CHEST PORT 1 VIEW  Result Date: 11/02/2022 CLINICAL DATA:  Airspace opacities on prior exam EXAM: PORTABLE CHEST 1 VIEW COMPARISON:  11/01/2022 FINDINGS: There is previously nearly complete consolidation of the left hemithorax, currently the left upper lobe is aerated. Bilateral lower lobe airspace opacities  with obscuration of the hemidiaphragms. Hazy interstitial and potentially faint ground-glass opacity in the right upper lobe. Overall the degree of basilar opacity on the right is mildly increased, but with substantially improved aeration in the left upper lobe. Cannot exclude layering pleural effusions. Upper normal heart size. No pneumothorax. No significant bony findings. IMPRESSION: 1. Substantially improved aeration in the left upper lobe, currently the left upper lobe is aerated. 2. Mildly increased right basilar opacity, potentially from layering pleural effusion. 3. Hazy interstitial and potentially faint ground-glass opacity in the right upper lobe. 4. Upper normal heart size. 5. No pneumothorax or pneumomediastinum. Electronically Signed   By: Gaylyn Rong M.D.   On: 11/02/2022 16:43   DG CHEST PORT 1 VIEW  Result Date: 11/01/2022 CLINICAL DATA:  Hypoxia.  Shortness of breath. EXAM: PORTABLE CHEST 1 VIEW COMPARISON:  10/30/2022. FINDINGS: Since the prior study, there is new near complete opacification of left hemithorax. Findings may represent combination of left pleural effusion with collapse and/or consolidation. Small area of relative aeration is noted overlying the left mid lung zone. Findings are concerning for mucous plugging. Bronchoscopy is recommended. Redemonstration of diffuse alveolar and interstitial opacities overlying the right hemithorax without significant interval change. There is probable associated layering right pleural effusion without significant interval change as well. No right pneumothorax. Evaluation of cardiomediastinal silhouette is nondiagnostic due to left hemithorax opacification. However, there is probable mediastinal shift to the left. No acute osseous abnormalities. The soft tissues are within normal limits. IMPRESSION: 1. New near complete opacification of the left hemithorax with probable mediastinal shift to  the left. Findings are concerning for mucous  plugging. Bronchoscopy is recommended. 2. Stable diffuse alveolar and interstitial opacities overlying the right hemithorax with probable associated layering right pleural effusion. Electronically Signed   By: Jules Schick M.D.   On: 11/01/2022 09:39       LOS: 4 days   Latresha Yahr Rito Ehrlich  [facility] Pager on [email or web address]  11/03/2022, 9:01 AM

## 2022-11-03 NOTE — Plan of Care (Addendum)
Pt alert to self. Disoriented to place, time and situation. Pt has had diarrhea per daughter request and judgment held colace. Perineum excoriated. Protective ointment and powders applied. Prevalon boots on. Foam to stg 2 to sacrum. Rotating every 2 hours. HFNC in place 30L/45% fio2. Sats 95-97%. . Mitts placed and daughter aware when updated given. Pt pulling of o2 sat probe and HFNC. Attempting to pull IV and catheter. BS 48 at 2030 D50 per standing order given and Dr. Julian Reil aware that BS increased to 135. This am bs 73. MD aware and IV fluids ordered to change to D10 @ 50.  Problem: Education: Goal: Knowledge of General Education information will improve Description: Including pain rating scale, medication(s)/side effects and non-pharmacologic comfort measures Outcome: Progressing   Problem: Health Behavior/Discharge Planning: Goal: Ability to manage health-related needs will improve Outcome: Progressing   Problem: Clinical Measurements: Goal: Ability to maintain clinical measurements within normal limits will improve Outcome: Progressing Goal: Will remain free from infection Outcome: Progressing Goal: Diagnostic test results will improve Outcome: Progressing Goal: Respiratory complications will improve Outcome: Progressing Goal: Cardiovascular complication will be avoided Outcome: Progressing   Problem: Activity: Goal: Risk for activity intolerance will decrease Outcome: Progressing   Problem: Nutrition: Goal: Adequate nutrition will be maintained Outcome: Progressing   Problem: Coping: Goal: Level of anxiety will decrease Outcome: Progressing   Problem: Elimination: Goal: Will not experience complications related to bowel motility Outcome: Progressing Goal: Will not experience complications related to urinary retention Outcome: Progressing   Problem: Pain Managment: Goal: General experience of comfort will improve Outcome: Progressing   Problem: Safety: Goal:  Ability to remain free from injury will improve Outcome: Progressing   Problem: Skin Integrity: Goal: Risk for impaired skin integrity will decrease Outcome: Progressing   Problem: Education: Goal: Ability to describe self-care measures that may prevent or decrease complications (Diabetes Survival Skills Education) will improve Outcome: Progressing   Problem: Coping: Goal: Ability to adjust to condition or change in health will improve Outcome: Progressing   Problem: Fluid Volume: Goal: Ability to maintain a balanced intake and output will improve Outcome: Progressing   Problem: Metabolic: Goal: Ability to maintain appropriate glucose levels will improve Outcome: Progressing   Problem: Nutritional: Goal: Maintenance of adequate nutrition will improve Outcome: Progressing   Problem: Skin Integrity: Goal: Risk for impaired skin integrity will decrease Outcome: Progressing   Problem: Tissue Perfusion: Goal: Adequacy of tissue perfusion will improve Outcome: Progressing

## 2022-11-03 NOTE — Progress Notes (Signed)
Initial Nutrition Assessment  DOCUMENTATION CODES:   Not applicable  INTERVENTION:  Tube feeding via PEG tube: Start Jevity 1.5 at 53ml/hr and advance by 10ml q8h to a goal rate of 77ml/hr ( per day) 60ml ProSource tube feeding once daily Conitnue free water flushes q4h  Goal tube feeding regimen provides 2240 kcal, 112g protein, total free water daily (TF +FWF)  NUTRITION DIAGNOSIS:   Increased nutrient needs related to acute illness, wound healing as evidenced by estimated needs.  GOAL:   Patient will meet greater than or equal to 90% of their needs  MONITOR:   Labs, Weight trends, TF tolerance, Skin  REASON FOR ASSESSMENT:   Consult Assessment of nutrition requirement/status  ASSESSMENT:   Pt admitted with acute hypoxic respiratory failure 2/2 new onset CHF. PMH significant for CVA, CKD, HTN, HLD, lewy body dementia, PVD, CVA, T2DM. Pt recently discharged to [facility] following admission for anemia, aspiration PNA and bilateral LE pressure ulcers.  PMT Following for GOC. Pt continues with NTS. No plans for bronchoscopy d/t concerns for vent dependence.   Pt has been followed during prior admissions. Per review of notes, GI had recommended consideration of G-tube into J-tube given recurrent aspiration events. Suspect a degree of aspiration could be r/t inability to manage secretions effectively. Discussed with MD. May consider conversion of feeding tube at a later date but holding for now.   Tube feeding held yesterday d/t concerns of aspiration. MD plans to resume today at a rate of 41ml/hr. Pt became hypoglycemic over night requiring D10 infusion. Reached out to MD to determine if tube feeding can be slowly titrated. MD in agreement.   During prior admission, pt was started on Jevity to aid in loose stools. SNF did not have this on formulary, therefore pt was transitioned to Osmolite as outpatient.   Pt's weight over the last 5 months has remains  around 84-85 kg. Weight appears to have increased during admission with presence of edema.   Edema: non-pitting BUE, mild pitting BLE  Medications: colace, fergon, SSI 0-9 units q4h (not given), MVI, protonix  Drips:  D10 @ 28ml/hr IV abx  Labs: BUN 27, Cr 1.50, GFR 48, Hgb 7.8, CBG's 48-135 x24 hours  NUTRITION - FOCUSED PHYSICAL EXAM: RD working remotely. Deferred to follow up.   Diet Order:   Diet Order             Diet NPO time specified  Diet effective now                   EDUCATION NEEDS:   No education needs have been identified at this time  Skin:  Skin Integrity Issues:: DTI, Unstageable DTI: R heel Unstageable: Mid sacrum  Last BM:  8/30 (type 6 x3)  Height:   Ht Readings from Last 1 Encounters:  10/30/22 5\' 9"  (1.753 m)    Weight:   Wt Readings from Last 1 Encounters:  11/03/22 84.1 kg   BMI:  Body mass index is 27.38 kg/m.  Estimated Nutritional Needs:   Kcal:  2100-2300  Protein:  105-115g  Fluid:  >/=2L  Drusilla Kanner, RDN, LDN Clinical Nutrition

## 2022-11-04 DIAGNOSIS — T17998A Other foreign object in respiratory tract, part unspecified causing other injury, initial encounter: Secondary | ICD-10-CM | POA: Diagnosis not present

## 2022-11-04 DIAGNOSIS — E162 Hypoglycemia, unspecified: Secondary | ICD-10-CM | POA: Diagnosis not present

## 2022-11-04 DIAGNOSIS — T17908A Unspecified foreign body in respiratory tract, part unspecified causing other injury, initial encounter: Secondary | ICD-10-CM | POA: Diagnosis not present

## 2022-11-04 DIAGNOSIS — I509 Heart failure, unspecified: Secondary | ICD-10-CM | POA: Diagnosis not present

## 2022-11-04 DIAGNOSIS — J9601 Acute respiratory failure with hypoxia: Secondary | ICD-10-CM | POA: Diagnosis not present

## 2022-11-04 DIAGNOSIS — Z7189 Other specified counseling: Secondary | ICD-10-CM | POA: Diagnosis not present

## 2022-11-04 DIAGNOSIS — Z515 Encounter for palliative care: Secondary | ICD-10-CM | POA: Diagnosis not present

## 2022-11-04 LAB — GLUCOSE, CAPILLARY
Glucose-Capillary: 118 mg/dL — ABNORMAL HIGH (ref 70–99)
Glucose-Capillary: 121 mg/dL — ABNORMAL HIGH (ref 70–99)
Glucose-Capillary: 107 mg/dL — ABNORMAL HIGH (ref 70–99)
Glucose-Capillary: 108 mg/dL — ABNORMAL HIGH (ref 70–99)
Glucose-Capillary: 133 mg/dL — ABNORMAL HIGH (ref 70–99)

## 2022-11-04 LAB — TYPE AND SCREEN
ABO/RH(D): B POS
Antibody Screen: NEGATIVE
Unit division: 0

## 2022-11-04 LAB — BPAM RBC
Blood Product Expiration Date: 202409022359
ISSUE DATE / TIME: 202408300954
Unit Type and Rh: 7300

## 2022-11-04 LAB — CBC
HCT: 23.6 % — ABNORMAL LOW (ref 39.0–52.0)
Hemoglobin: 7.5 g/dL — ABNORMAL LOW (ref 13.0–17.0)
MCH: 28 pg (ref 26.0–34.0)
MCHC: 31.8 g/dL (ref 30.0–36.0)
MCV: 88.1 fL (ref 80.0–100.0)
Platelets: 356 10*3/uL (ref 150–400)
RBC: 2.68 MIL/uL — ABNORMAL LOW (ref 4.22–5.81)
RDW: 17 % — ABNORMAL HIGH (ref 11.5–15.5)
WBC: 7 10*3/uL (ref 4.0–10.5)
nRBC: 0 % (ref 0.0–0.2)

## 2022-11-04 LAB — BASIC METABOLIC PANEL
Anion gap: 10 (ref 5–15)
BUN: 22 mg/dL (ref 8–23)
CO2: 23 mmol/L (ref 22–32)
Calcium: 7.3 mg/dL — ABNORMAL LOW (ref 8.9–10.3)
Chloride: 102 mmol/L (ref 98–111)
Creatinine, Ser: 1.38 mg/dL — ABNORMAL HIGH (ref 0.61–1.24)
GFR, Estimated: 53 mL/min — ABNORMAL LOW (ref >60)
Glucose, Bld: 272 mg/dL — ABNORMAL HIGH (ref 70–99)
Potassium: 3.5 mmol/L (ref 3.5–5.1)
Sodium: 135 mmol/L (ref 135–145)

## 2022-11-04 MED ORDER — ALBUTEROL SULFATE (2.5 MG/3ML) 0.083% IN NEBU
2.5000 mg | INHALATION_SOLUTION | Freq: Four times a day (QID) | RESPIRATORY_TRACT | Status: DC
  Administered 2022-11-04 – 2022-11-05 (×7): 2.5 mg via RESPIRATORY_TRACT
  Filled 2022-11-04 (×8): qty 3

## 2022-11-04 MED ORDER — FUROSEMIDE 10 MG/ML IJ SOLN
20.0000 mg | Freq: Once | INTRAMUSCULAR | Status: AC
  Administered 2022-11-04: 20 mg via INTRAVENOUS
  Filled 2022-11-04: qty 2

## 2022-11-04 NOTE — Progress Notes (Signed)
Patient's daughter called and update provided.

## 2022-11-04 NOTE — Progress Notes (Signed)
Daily Progress Note   Patient Name: Harry [name]       Date: 11/04/2022 DOB: Mar 06, 1945  Age: 77 y.o. MRN#: [id number] Attending Physician: Osvaldo Shipper, MD Primary Care Physician: Leonia Reader, Barbara Cower, MD Admit Date: 10/30/2022  Reason for Consultation/Follow-up: Establishing goals of care  Subjective: Medical records reviewed including progress notes, labs, and imaging. Patient assessed at the bedside. He remains lethargic on HHFNC. No family present during my visit.   Called patient's daughter [name] to provide palliative support.  She denies any needs or concerns at this time.  I informed her that I am off service until 9/5, though she is encouraged to reach out to PMT if additional palliative needs arise in the interim.  She confirms she has PMT contact information if needed and voices her appreciation.  Questions and concerns addressed. PMT will continue to support holistically.   Length of Stay: 5   Physical Exam Vitals and nursing note reviewed.  Constitutional:      General: He is not in acute distress.    Appearance: He is ill-appearing.  Cardiovascular:     Rate and Rhythm: Normal rate.  Pulmonary:     Effort: Pulmonary effort is normal.  Psychiatric:        Cognition and Memory: Cognition is impaired.             Vital Signs: BP 138/75 (BP Location: Right Arm)   Pulse 79   Temp 98.8 F (37.1 C) (Oral)   Resp (!) 25   Ht 5\' 9"  (1.753 m)   Wt 84.1 kg   SpO2 92%   BMI 27.38 kg/m  SpO2: SpO2: 92 % O2 Device: O2 Device: High Flow Nasal Cannula O2 Flow Rate: O2 Flow Rate (L/min): 35 L/min      Palliative Assessment/Data: 30% (on tube feeds)   Palliative Care Assessment & Plan   Patient Profile: 77 y.o. male  with past medical history of Lewy body dementia,  CVA, CKD, dysphagia with PEG, HTN, chronic foley, PVD, and DM  admitted on 10/30/2022 with respiratory distress.    Patient was recently discharged on 8/27 to [facility] after hospitalization for acute anemia, aspiration pneumonia, bilateral pressure ulcers.  He returned for evaluation after 1 hour at the SNF due to hypoxia and respiratory symptoms.  He is now admitted  for acute hypoxic respiratory failure secondary to new onset CHF.  Initial CXR was concerning for volume overload and heart failure though unfortunately, CXR on 8/29 showed complete whiteout of the left lung and concern for mucous plugging.  Patient would be high risk for prolonged mechanical ventilation after bronchoscopy.   Patient is known to PMT from prior hospitalizations.  He has been hospitalized 5 times in the past 6 months.  PMT has been consulted to assist with goals of care conversation with daughter's consent.  Assessment: Goals of care conversation Acute respiratory failure with hypoxemia Recurrent aspiration pneumonia Acute diastolic CHF  Recommendations/Plan: Continue DNR/DNI Continue full scope treatment Goals of care are clear at this time and daughter is hopeful for patient's improvement, reconsideration for conversion of PEG to GJ tube, and return to SNF Psychosocial and emotional support provided PMT remains available as needed   Prognosis: Poor with high risk for further decline  Discharge Planning: To Be Determined  Care plan was discussed with patient's daughter   Total time: I spent 25 minutes in the care of the patient today in the above activities and documenting the encounter.          Brytney Somes Jeni Salles, PA-C  Palliative Medicine Team Team phone # [phone]  Thank you for allowing the Palliative Medicine Team to assist in the care of this patient. Please utilize secure chat with additional questions, if there is no response within 30 minutes please call the above phone  number.  Palliative Medicine Team providers are available by phone from 7am to 7pm daily and can be reached through the team cell phone.  Should this patient require assistance outside of these hours, please call the patient's attending physician.

## 2022-11-04 NOTE — Plan of Care (Signed)
  Problem: Clinical Measurements: Goal: Will remain free from infection Outcome: Progressing   Problem: Clinical Measurements: Goal: Diagnostic test results will improve Outcome: Progressing   Problem: Clinical Measurements: Goal: Respiratory complications will improve Outcome: Progressing   
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
## 2022-11-04 NOTE — Plan of Care (Signed)
  Problem: Education: Goal: Knowledge of General Education information will improve Description: Including pain rating scale, medication(s)/side effects and non-pharmacologic comfort measures Outcome: Not Progressing   Problem: Health Behavior/Discharge Planning: Goal: Ability to manage health-related needs will improve Outcome: Not Progressing   Problem: Clinical Measurements: Goal: Ability to maintain clinical measurements within normal limits will improve Outcome: Not Progressing Goal: Will remain free from infection Outcome: Not Progressing   

## 2022-11-04 NOTE — Progress Notes (Addendum)
[facility] PROGRESS NOTE   CARDER BELOW LOV:[id number] DOB: 02-22-1946 DOA: 10/30/2022  PCP: Crist Fat, MD  Brief History/Interval Summary: 77 y.o. male with a known history of CVA, CKD, hypertension, hyperlipidemia, Lewy body dementia peripheral vascular disease, CVA and type 2 diabetes presents to the emergency department for evaluation of respiratory distress.  Patient was discharged from the hospital on 10/30/22 to [facility] after admission for anemia requiring transfusion, aspiration pneumonia and bilateral LE pressure ulcers.  He was at the facility for one hour and was found to be hypoxic into the 70s and in respiratory distress.  CPAP was started as well as a nebulizer.  Of note patient has indwelling foley and GTube.   Consultants: Pulmonology.  Palliative care  Procedures: None yet    Subjective/Interval History: Patient noted to be awake but does not answer questions.  Confused.     Assessment/Plan:  Acute respiratory failure with hypoxemia/recurrent aspiration pneumonia Was sent back from skilled nursing facility since he was noted to be hypoxic.  Initially on 3 L of oxygen via nasal cannula.  Chest x-ray suggested congestive heart failure.  Patient was hospitalized.  Patient was given furosemide.   COVID-19, influenza and RSV PCR's were negative. On the morning of 8/29 patient was noted to be significantly hypoxic.  Chest x-ray showed whiteout of the left lung. Patient likely experiencing mucous plugging because of aspiration. Pulmonology was consulted.  They have had multiple discussions with patient's daughter.  At this time supportive care has been provided.  No plans for procedures.  Patient's CODE STATUS was changed over to DNR/DNI by his daughter. Broad-spectrum antibiotic coverage was also initiated by pulmonology.  Patient was initially on vancomycin and Zosyn.  Currently only on Zosyn.   Chest x-ray 8/30 and shows improvement in the aeration of the  left lung. Still requiring heated high flow oxygen by nasal cannula.  Continue to wean down to maintain saturations greater than 90%.  Acute on chronic diastolic CHF Echocardiogram shows normal systolic function with grade 2 diastolic dysfunction.  Treated with furosemide initially.  Developed mucous plugging.  Furosemide was discontinued.  Will give him a dose today since he appears to be more edematous compared to the last few days.  Weight has also increased.  Chronic anemia/chronic GI blood loss During recent hospitalization he was transfused PRBC.  There was concern for GI bleeding.  Gastroenterology was consulted who did not recommend any invasive studies due to high risk. Aspirin and Plavix were discontinued as he was having persistent dark tarry stools in the hospital. He was discharged on PPI and Carafate. Hemoglobin drifted down to 6.6 on 8/30.  He was transfused 1 unit of PRBC.  Improvement in hemoglobin noted.  Continue to monitor.  No evidence of overt bleeding.   Essential hypertension Continue to monitor closely.  Occasional high readings noted.  History of urinary retention with chronic indwelling Foley Continue Foley catheter.  Continue Flomax.  History of stroke Aspirin and Plavix has been held since previous admission due to GI bleed.  History of Lewy body dementia/acute metabolic encephalopathy Patient was quite encephalopathic.  Seems to be improving.  Is poor to begin with.  Hyperlipidemia Continue statin.  History of diabetes hypoglycemia HbA1c 6.5 in March.  Low glucose levels noted.  Likely due to tube feedings being held.  On D10.  Tube feedings were initiated yesterday.  CBGs have improved.  Will discontinue D10.  [name] Kitchen  Chronic dysphagia with PEG tube Tube feedings  have been resumed.   Daughter asking if the PEG tube can be changed over to GJ tube.  Told her that we can discuss with interventional radiology once patient's respiratory status is improved.   Patient is on significantly large amounts of oxygen which precludes any procedures at this time.  She was also inquiring about TPN.  She was told that TPN is usually given to patient's whose GI tract is nonfunctional.  It also introduces higher risk of developing infections.  This was communicated to the daughter and she understands.  Chronic kidney disease stage IIIa Renal function close to baseline.  Goals of care Long discussions with patient's daughter by pulmonology and palliative medicine.  After much thought she has decided to transition patient to DNR/DNI.  Continue other care for now.  DVT Prophylaxis: SCDs only for now.  Code Status: Full code Family Communication: Daughter being updated daily. Disposition Plan: SNF when medically stable  Status is: Inpatient Remains inpatient appropriate because: Acute respiratory failure with hypoxia      Medications: Scheduled:  albuterol  2.5 mg Nebulization Q6H   Chlorhexidine Gluconate Cloth  6 each Topical Daily   docusate  100 mg Per Tube BID   feeding supplement (PROSource TF20)  60 mL Per Tube Daily   ferrous gluconate  324 mg Oral QHS   free water  150 mL Per Tube Q4H   guaiFENesin  15 mL Per Tube Q4H   insulin aspart  0-9 Units Subcutaneous Q4H   multivitamin with minerals  1 tablet Per Tube Daily   nystatin  15 mL Mouth/Throat TID   pantoprazole (PROTONIX) IV  40 mg Intravenous Q12H   polyvinyl alcohol  1 drop Both Eyes TID   pyridostigmine  30 mg Per Tube Q8H   rosuvastatin  5 mg Per Tube QHS   sodium chloride flush  3 mL Intravenous Q12H   tamsulosin  0.4 mg Oral Daily   Continuous:  sodium chloride     feeding supplement (JEVITY 1.5 CAL/FIBER) 1,000 mL (11/03/22 1357)   piperacillin-tazobactam (ZOSYN)  IV 3.375 g (11/04/22 0424)   GBT:DVVOHY chloride, acetaminophen, albuterol, ipratropium-albuterol, ondansetron (ZOFRAN) IV, sodium chloride flush  Antibiotics: Anti-infectives (From admission, onward)    Start      Dose/Rate Route Frequency Ordered Stop   11/02/22 2300  vancomycin (VANCOREADY) IVPB 1750 mg/350 mL  Status:  Discontinued        1,750 mg 175 mL/hr over 120 Minutes Intravenous Every 24 hours 11/02/22 0837 11/02/22 1252   11/02/22 1400  vancomycin (VANCOREADY) IVPB 1250 mg/250 mL  Status:  Discontinued        1,250 mg 166.7 mL/hr over 90 Minutes Intravenous Every 24 hours 11/01/22 1206 11/02/22 0837   11/01/22 2000  piperacillin-tazobactam (ZOSYN) IVPB 3.375 g        3.375 g 12.5 mL/hr over 240 Minutes Intravenous Every 8 hours 11/01/22 1206     11/01/22 1230  vancomycin (VANCOREADY) IVPB 1500 mg/300 mL        1,500 mg 150 mL/hr over 120 Minutes Intravenous  Once 11/01/22 1122 11/01/22 1517   11/01/22 1200  piperacillin-tazobactam (ZOSYN) IVPB 3.375 g        3.375 g 100 mL/hr over 30 Minutes Intravenous  Once 11/01/22 1122 11/01/22 1307       Objective:  Vital Signs  Vitals:   11/04/22 0154 11/04/22 0407 11/04/22 0630 11/04/22 0740  BP:  131/65    Pulse:  86  79  Resp:  20  (!)  30  Temp:  98 F (36.7 C)    TempSrc:  Oral    SpO2: 98% 99%  92%  Weight:   84.1 kg   Height:        Intake/Output Summary (Last 24 hours) at 11/04/2022 0821 Last data filed at 11/04/2022 0417 Gross per 24 hour  Intake 43 ml  Output 1250 ml  Net -1207 ml   Filed Weights   11/02/22 0438 11/03/22 0500 11/04/22 0630  Weight: 84.9 kg 84.1 kg 84.1 kg    General appearance: Awake alert.  In no distress distracted and confused. Resp: Mildly tachypneic.  Improved aeration on the left though still diminished in the left lower lung. Cardio: S1-S2 is normal regular.  No S3-S4.  No rubs murmurs or bruit GI: Abdomen is soft.  Nontender nondistended.  Bowel sounds are present normal.  No masses organomegaly Extremities: Mild edema bilateral lower extremities and upper extremities   Lab Results:  Data Reviewed: I have personally reviewed following labs and reports of the imaging  studies  CBC: Recent Labs  Lab 10/29/22 1758 10/30/22 0458 10/30/22 1757 10/30/22 1810 10/31/22 0651 11/01/22 1057 11/02/22 0728 11/02/22 1438 11/03/22 0303 11/04/22 0241  WBC 4.9 6.3 6.8  --  7.5 10.7* 10.3  --  9.0 7.0  NEUTROABS 3.1 4.3 4.9  --   --   --   --   --   --   --   HGB 7.2* 7.7* 7.1*   < > 7.6* 7.0* 6.6* 8.0* 7.8* 7.5*  HCT 23.5* 25.5* 23.0*   < > 24.3* 21.8* 21.2* 25.1* 24.6* 23.6*  MCV 89.7 91.1 90.6  --  90.0 86.5 88.0  --  86.0 88.1  PLT 214 245 266  --  302 363 362  --  379 356   < > = values in this interval not displayed.    Basic Metabolic Panel: Recent Labs  Lab 10/31/22 0825 11/01/22 0313 11/02/22 0728 11/03/22 0303 11/04/22 0241  NA 144 141 144 142 135  K 3.6 3.9 4.0 3.6 3.5  CL 109 105 107 105 102  CO2 26 27 25 24 23   GLUCOSE 155* 203* 123* 71 272*  BUN 28* 33* 34* 27* 22  CREATININE 1.38* 1.50* 1.62* 1.50* 1.38*  CALCIUM 8.3* 7.9* 8.0* 8.0* 7.3*    GFR: Estimated Creatinine Clearance: 46.3 mL/min (A) (by C-G formula based on SCr of 1.38 mg/dL (H)).  Liver Function Tests: Recent Labs  Lab 10/30/22 1757  AST 17  ALT 18  ALKPHOS 74  BILITOT 0.5  PROT 6.2*  ALBUMIN 2.1*    CBG: Recent Labs  Lab 11/03/22 1515 11/03/22 2004 11/03/22 2350 11/04/22 0405 11/04/22 0723  GLUCAP 135* 128* 135* 118* 121*     Recent Results (from the past 240 hour(s))  Resp panel by RT-PCR (RSV, Flu A&B, Covid) Anterior Nasal Swab     Status: None   Collection Time: 10/30/22  5:47 PM   Specimen: Anterior Nasal Swab  Result Value Ref Range Status   SARS Coronavirus 2 by RT PCR NEGATIVE NEGATIVE Final   Influenza A by PCR NEGATIVE NEGATIVE Final   Influenza B by PCR NEGATIVE NEGATIVE Final    Comment: (NOTE) The Xpert Xpress SARS-CoV-2/FLU/RSV plus assay is intended as an aid in the diagnosis of influenza from Nasopharyngeal swab specimens and should not be used as a sole basis for treatment. Nasal washings and aspirates are unacceptable for  Xpert Xpress SARS-CoV-2/FLU/RSV testing.  Fact Sheet for Patients:  BloggerCourse.com  Fact Sheet for Healthcare Providers: SeriousBroker.it  This test is not yet approved or cleared by the [place] FDA and has been authorized for detection and/or diagnosis of SARS-CoV-2 by FDA under an Emergency Use Authorization (EUA). This EUA will remain in effect (meaning this test can be used) for the duration of the COVID-19 declaration under Section 564(b)(1) of the Act, 21 U.S.C. section 360bbb-3(b)(1), unless the authorization is terminated or revoked.     Resp Syncytial Virus by PCR NEGATIVE NEGATIVE Final    Comment: (NOTE) Fact Sheet for Patients: BloggerCourse.com  Fact Sheet for Healthcare Providers: SeriousBroker.it  This test is not yet approved or cleared by the [place] FDA and has been authorized for detection and/or diagnosis of SARS-CoV-2 by FDA under an Emergency Use Authorization (EUA). This EUA will remain in effect (meaning this test can be used) for the duration of the COVID-19 declaration under Section 564(b)(1) of the Act, 21 U.S.C. section 360bbb-3(b)(1), unless the authorization is terminated or revoked.  Performed at [facility], [address] [place], [place] [address]   MRSA Next Gen by PCR, Nasal     Status: None   Collection Time: 11/01/22 11:17 AM   Specimen: Nasal Mucosa; Nasal Swab  Result Value Ref Range Status   MRSA by PCR Next Gen NOT DETECTED NOT DETECTED Final    Comment: (NOTE) The GeneXpert MRSA Assay (FDA approved for NASAL specimens only), is one component of a comprehensive MRSA colonization surveillance program. It is not intended to diagnose MRSA infection nor to guide or monitor treatment for MRSA infections. Test performance is not FDA approved in patients less than 19 years old. Performed at [facility],  [address], [place], [place] [address]       Radiology Studies: DG CHEST PORT 1 VIEW  Result Date: 11/02/2022 CLINICAL DATA:  Airspace opacities on prior exam EXAM: PORTABLE CHEST 1 VIEW COMPARISON:  11/01/2022 FINDINGS: There is previously nearly complete consolidation of the left hemithorax, currently the left upper lobe is aerated. Bilateral lower lobe airspace opacities with obscuration of the hemidiaphragms. Hazy interstitial and potentially faint ground-glass opacity in the right upper lobe. Overall the degree of basilar opacity on the right is mildly increased, but with substantially improved aeration in the left upper lobe. Cannot exclude layering pleural effusions. Upper normal heart size. No pneumothorax. No significant bony findings. IMPRESSION: 1. Substantially improved aeration in the left upper lobe, currently the left upper lobe is aerated. 2. Mildly increased right basilar opacity, potentially from layering pleural effusion. 3. Hazy interstitial and potentially faint ground-glass opacity in the right upper lobe. 4. Upper normal heart size. 5. No pneumothorax or pneumomediastinum. Electronically Signed   By: Gaylyn Rong M.D.   On: 11/02/2022 16:43       LOS: 5 days   Mikaili Flippin Rito Ehrlich  [facility] Pager on [email or web address]  11/04/2022, 8:21 AM

## 2022-11-05 DIAGNOSIS — R609 Edema, unspecified: Secondary | ICD-10-CM | POA: Diagnosis not present

## 2022-11-05 DIAGNOSIS — T17908A Unspecified foreign body in respiratory tract, part unspecified causing other injury, initial encounter: Secondary | ICD-10-CM | POA: Diagnosis not present

## 2022-11-05 DIAGNOSIS — J9601 Acute respiratory failure with hypoxia: Secondary | ICD-10-CM | POA: Diagnosis not present

## 2022-11-05 LAB — GLUCOSE, CAPILLARY
Glucose-Capillary: 100 mg/dL — ABNORMAL HIGH (ref 70–99)
Glucose-Capillary: 114 mg/dL — ABNORMAL HIGH (ref 70–99)
Glucose-Capillary: 120 mg/dL — ABNORMAL HIGH (ref 70–99)
Glucose-Capillary: 130 mg/dL — ABNORMAL HIGH (ref 70–99)
Glucose-Capillary: 137 mg/dL — ABNORMAL HIGH (ref 70–99)
Glucose-Capillary: 169 mg/dL — ABNORMAL HIGH (ref 70–99)

## 2022-11-05 MED ORDER — FUROSEMIDE 10 MG/ML IJ SOLN
20.0000 mg | Freq: Once | INTRAMUSCULAR | Status: AC
  Administered 2022-11-05: 20 mg via INTRAVENOUS
  Filled 2022-11-05: qty 2

## 2022-11-05 MED ORDER — POTASSIUM CHLORIDE 20 MEQ PO PACK
40.0000 meq | PACK | Freq: Once | ORAL | Status: AC
  Administered 2022-11-05: 40 meq
  Filled 2022-11-05: qty 2

## 2022-11-05 MED ORDER — ALBUTEROL SULFATE (2.5 MG/3ML) 0.083% IN NEBU
2.5000 mg | INHALATION_SOLUTION | Freq: Two times a day (BID) | RESPIRATORY_TRACT | Status: DC
  Administered 2022-11-06 – 2022-11-12 (×12): 2.5 mg via RESPIRATORY_TRACT
  Filled 2022-11-05 (×13): qty 3

## 2022-11-05 NOTE — Plan of Care (Signed)

## 2022-11-05 NOTE — Progress Notes (Signed)
Pharmacy Antibiotic Note  Harry [name] is a 77 y.o. male admitted 10/30/22 with respiratory distress. Pharmacy has been consulted for Vancomycin and Zosyn dosing for aspiration pneumonia. De-escalated to Zosyn, intended for 7 days of therapy.   S/p Zosyn for aspiration pneumonia 8/17>>8/25, last admit. Discharged to SNF 8/27 and back to ED later that day.  Renal function improving.   Plan: Zosyn 3.375 gm IV q8h (infusing over 4 hours) Follow renal function, culture data, clinical progress and antibiotic plans. Narrow antibiotics as able.   Height: 5\' 9"  (175.3 cm) Weight: 79.7 kg (175 lb 11.3 oz) IBW/kg (Calculated) : 70.7  Temp (24hrs), Avg:98.1 F (36.7 C), Min:97.5 F (36.4 C), Max:98.6 F (37 C)  Recent Labs  Lab 10/31/22 0651 10/31/22 0825 11/01/22 0313 11/01/22 1057 11/02/22 0728 11/03/22 0303 11/04/22 0241  WBC 7.5  --   --  10.7* 10.3 9.0 7.0  CREATININE  --  1.38* 1.50*  --  1.62* 1.50* 1.38*    Estimated Creatinine Clearance: 46.3 mL/min (A) (by C-G formula based on SCr of 1.38 mg/dL (H)).    Allergies  Allergen Reactions   Lipitor [Atorvastatin] Other (See Comments)    Myalgias   Statins Other (See Comments)    Myalgias  Pt tolerating rosuvastatin 5mg  QD   Pravachol [Pravastatin] Rash    Antimicrobials this admission: Vancomycin 8/29 >>8/30 Zosyn 8/29 >>  Dose adjustments this admission: n/a  Microbiology results: 8/27 COVID, flu and RSV: negative 8/29 MRSA PCR: negative  Verdene Rio, PharmD PGY1 Pharmacy Resident

## 2022-11-05 NOTE — Progress Notes (Signed)
[facility] PROGRESS NOTE   Harry [name] UJW:[id number] DOB: [birth year] DOA: 10/30/2022  PCP: Crist Fat, MD  Brief History/Interval Summary: 77 y.o. male with a known history of CVA, CKD, hypertension, hyperlipidemia, Lewy body dementia peripheral vascular disease, CVA and type 2 diabetes presents to the emergency department for evaluation of respiratory distress.  Patient was discharged from the hospital on 10/30/22 to [facility] after admission for anemia requiring transfusion, aspiration pneumonia and bilateral LE pressure ulcers.  He was at the facility for one hour and was found to be hypoxic into the 70s and in respiratory distress.  CPAP was started as well as a nebulizer.  Of note patient has indwelling foley and GTube.   Consultants: Pulmonology.  Palliative care  Procedures: None yet    Subjective/Interval History: Patient much more awake and alert today compared to yesterday.  Denies any pain issues.  Asking when he will get over this "cold".     Assessment/Plan:  Acute respiratory failure with hypoxemia/recurrent aspiration pneumonia Was sent back from skilled nursing facility since he was noted to be hypoxic.  Initially on 3 L of oxygen via nasal cannula.  Chest x-ray suggested congestive heart failure.  Patient was hospitalized.  Patient was given furosemide.   COVID-19, influenza and RSV PCR's were negative. On the morning of 8/29 patient was noted to be significantly hypoxic.  Chest x-ray showed whiteout of the left lung. Patient likely experiencing mucous plugging because of aspiration. Pulmonology was consulted.  They have had multiple discussions with patient's daughter.  At this time supportive care has been provided.  No plans for procedures.  Patient's CODE STATUS was changed over to DNR/DNI by his daughter. Broad-spectrum antibiotic coverage was also initiated by pulmonology.  Patient was initially on vancomycin and Zosyn.  Currently only on Zosyn.    Chest x-ray 8/30 shows improvement in the aeration of the left lung. Still requiring heated high flow oxygen by nasal cannula.  Continue to wean down to maintain saturations greater than 90%. Will plan a 7-day course of Zosyn.  Acute on chronic diastolic CHF Echocardiogram shows normal systolic function with grade 2 diastolic dysfunction.  Treated with furosemide initially.  Developed mucous plugging.  Furosemide was discontinued.   Noted to have edema yesterday and was given furosemide with good diuresis.  Will give additional dose today.    Chronic anemia/chronic GI blood loss During recent hospitalization he was transfused PRBC.  There was concern for GI bleeding.  Gastroenterology was consulted who did not recommend any invasive studies due to high risk. Aspirin and Plavix were discontinued as he was having persistent dark tarry stools in the hospital. He was discharged on PPI and Carafate. Hemoglobin drifted down to 6.6 on 8/30.  He was transfused 1 unit of PRBC.  Improvement in hemoglobin noted.  Continue to monitor.  No evidence of overt bleeding.   Essential hypertension Continue to monitor closely.  Occasional high readings noted.  History of urinary retention with chronic indwelling Foley Continue Foley catheter.  Continue Flomax.  History of stroke Aspirin and Plavix has been held since previous admission due to GI bleed.  History of Lewy body dementia/acute metabolic encephalopathy Patient was quite encephalopathic.  Seems to be improving.  Baseline mentation is poor to begin with.  Hyperlipidemia Continue statin.  History of diabetes hypoglycemia HbA1c 6.5 in March.  Low glucose levels noted.  Likely due to tube feedings being held.  Required D10 briefly.  Tube feedings were reinitiated.  CBGs have stabilized.  D10 was discontinued.    Chronic dysphagia with PEG tube Tube feedings have been resumed.   Daughter asking if the PEG tube can be changed over to GJ tube.   Told her that we can discuss with interventional radiology once patient's respiratory status has improved.  Patient is on significantly large amounts of oxygen which precludes any procedures at this time.   She was also inquiring about TPN.  She was told that TPN is usually given to patient's whose GI tract is nonfunctional.  It also introduces higher risk of developing infections.  This was communicated to the daughter and she understands.  Chronic kidney disease stage IIIa Renal function close to baseline.  Goals of care Long discussions with patient's daughter by pulmonology and palliative medicine.  After much thought she has decided to transition patient to DNR/DNI.  Continue other care for now.  DVT Prophylaxis: SCDs only for now.  Code Status: Full code Family Communication: Daughter being updated daily. Disposition Plan: SNF when medically stable  Status is: Inpatient Remains inpatient appropriate because: Acute respiratory failure with hypoxia      Medications: Scheduled:  albuterol  2.5 mg Nebulization Q6H   Chlorhexidine Gluconate Cloth  6 each Topical Daily   docusate  100 mg Per Tube BID   feeding supplement (PROSource TF20)  60 mL Per Tube Daily   ferrous gluconate  324 mg Oral QHS   free water  150 mL Per Tube Q4H   guaiFENesin  15 mL Per Tube Q4H   insulin aspart  0-9 Units Subcutaneous Q4H   multivitamin with minerals  1 tablet Per Tube Daily   nystatin  15 mL Mouth/Throat TID   pantoprazole (PROTONIX) IV  40 mg Intravenous Q12H   polyvinyl alcohol  1 drop Both Eyes TID   pyridostigmine  30 mg Per Tube Q8H   rosuvastatin  5 mg Per Tube QHS   sodium chloride flush  3 mL Intravenous Q12H   tamsulosin  0.4 mg Oral Daily   Continuous:  sodium chloride     feeding supplement (JEVITY 1.5 CAL/FIBER) 1,000 mL (11/03/22 1357)   piperacillin-tazobactam (ZOSYN)  IV 3.375 g (11/05/22 0329)   ZOX:WRUEAV chloride, acetaminophen, albuterol, ipratropium-albuterol,  ondansetron (ZOFRAN) IV, sodium chloride flush  Antibiotics: Anti-infectives (From admission, onward)    Start     Dose/Rate Route Frequency Ordered Stop   11/02/22 2300  vancomycin (VANCOREADY) IVPB 1750 mg/350 mL  Status:  Discontinued        1,750 mg 175 mL/hr over 120 Minutes Intravenous Every 24 hours 11/02/22 0837 11/02/22 1252   11/02/22 1400  vancomycin (VANCOREADY) IVPB 1250 mg/250 mL  Status:  Discontinued        1,250 mg 166.7 mL/hr over 90 Minutes Intravenous Every 24 hours 11/01/22 1206 11/02/22 0837   11/01/22 2000  piperacillin-tazobactam (ZOSYN) IVPB 3.375 g        3.375 g 12.5 mL/hr over 240 Minutes Intravenous Every 8 hours 11/01/22 1206     11/01/22 1230  vancomycin (VANCOREADY) IVPB 1500 mg/300 mL        1,500 mg 150 mL/hr over 120 Minutes Intravenous  Once 11/01/22 1122 11/01/22 1517   11/01/22 1200  piperacillin-tazobactam (ZOSYN) IVPB 3.375 g        3.375 g 100 mL/hr over 30 Minutes Intravenous  Once 11/01/22 1122 11/01/22 1307       Objective:  Vital Signs  Vitals:   11/05/22 0100 11/05/22 0405 11/05/22 0726 11/05/22  0736  BP:  (!) 171/76 (!) 166/77   Pulse: 88 88 84   Resp: (!) 25 17 20  (!) 22  Temp:  98.2 F (36.8 C) 98.2 F (36.8 C)   TempSrc:  Oral Oral   SpO2: 98% 95% 100%   Weight:  79.7 kg    Height:        Intake/Output Summary (Last 24 hours) at 11/05/2022 0828 Last data filed at 11/05/2022 0407 Gross per 24 hour  Intake 953.7 ml  Output 2100 ml  Net -1146.3 ml   Filed Weights   11/03/22 0500 11/04/22 0630 11/05/22 0405  Weight: 84.1 kg 84.1 kg 79.7 kg    General appearance: Awake alert.  In no distress Resp: Improved aeration left lung.  Crackles at the bases.  Mildly tachypneic Cardio: S1-S2 is normal regular.  No S3-S4.  No rubs murmurs or bruit GI: Abdomen is soft.  Nontender nondistended.  Bowel sounds are present normal.  No masses organomegaly.  PEG tube was noted.  Foley catheter is noted Extremities: Edema is  improved More awake and alert today.  Still quite physically deconditioned.  No obvious focal neurological deficits.   Lab Results:  Data Reviewed: I have personally reviewed following labs and reports of the imaging studies  CBC: Recent Labs  Lab 10/29/22 1758 10/30/22 0458 10/30/22 1757 10/30/22 1810 10/31/22 0651 11/01/22 1057 11/02/22 0728 11/02/22 1438 11/03/22 0303 11/04/22 0241  WBC 4.9 6.3 6.8  --  7.5 10.7* 10.3  --  9.0 7.0  NEUTROABS 3.1 4.3 4.9  --   --   --   --   --   --   --   HGB 7.2* 7.7* 7.1*   < > 7.6* 7.0* 6.6* 8.0* 7.8* 7.5*  HCT 23.5* 25.5* 23.0*   < > 24.3* 21.8* 21.2* 25.1* 24.6* 23.6*  MCV 89.7 91.1 90.6  --  90.0 86.5 88.0  --  86.0 88.1  PLT 214 245 266  --  302 363 362  --  379 356   < > = values in this interval not displayed.    Basic Metabolic Panel: Recent Labs  Lab 10/31/22 0825 11/01/22 0313 11/02/22 0728 11/03/22 0303 11/04/22 0241  NA 144 141 144 142 135  K 3.6 3.9 4.0 3.6 3.5  CL 109 105 107 105 102  CO2 26 27 25 24 23   GLUCOSE 155* 203* 123* 71 272*  BUN 28* 33* 34* 27* 22  CREATININE 1.38* 1.50* 1.62* 1.50* 1.38*  CALCIUM 8.3* 7.9* 8.0* 8.0* 7.3*    GFR: Estimated Creatinine Clearance: 46.3 mL/min (A) (by C-G formula based on SCr of 1.38 mg/dL (H)).  Liver Function Tests: Recent Labs  Lab 10/30/22 1757  AST 17  ALT 18  ALKPHOS 74  BILITOT 0.5  PROT 6.2*  ALBUMIN 2.1*    CBG: Recent Labs  Lab 11/04/22 1546 11/04/22 2037 11/05/22 0002 11/05/22 0403 11/05/22 0725  GLUCAP 108* 133* 137* 130* 100*     Recent Results (from the past 240 hour(s))  Resp panel by RT-PCR (RSV, Flu A&B, Covid) Anterior Nasal Swab     Status: None   Collection Time: 10/30/22  5:47 PM   Specimen: Anterior Nasal Swab  Result Value Ref Range Status   SARS Coronavirus 2 by RT PCR NEGATIVE NEGATIVE Final   Influenza A by PCR NEGATIVE NEGATIVE Final   Influenza B by PCR NEGATIVE NEGATIVE Final    Comment: (NOTE) The Xpert Xpress  SARS-CoV-2/FLU/RSV plus assay is intended  as an aid in the diagnosis of influenza from Nasopharyngeal swab specimens and should not be used as a sole basis for treatment. Nasal washings and aspirates are unacceptable for Xpert Xpress SARS-CoV-2/FLU/RSV testing.  Fact Sheet for Patients: BloggerCourse.com  Fact Sheet for Healthcare Providers: SeriousBroker.it  This test is not yet approved or cleared by the [place] FDA and has been authorized for detection and/or diagnosis of SARS-CoV-2 by FDA under an Emergency Use Authorization (EUA). This EUA will remain in effect (meaning this test can be used) for the duration of the COVID-19 declaration under Section 564(b)(1) of the Act, 21 U.S.C. section 360bbb-3(b)(1), unless the authorization is terminated or revoked.     Resp Syncytial Virus by PCR NEGATIVE NEGATIVE Final    Comment: (NOTE) Fact Sheet for Patients: BloggerCourse.com  Fact Sheet for Healthcare Providers: SeriousBroker.it  This test is not yet approved or cleared by the [place] FDA and has been authorized for detection and/or diagnosis of SARS-CoV-2 by FDA under an Emergency Use Authorization (EUA). This EUA will remain in effect (meaning this test can be used) for the duration of the COVID-19 declaration under Section 564(b)(1) of the Act, 21 U.S.C. section 360bbb-3(b)(1), unless the authorization is terminated or revoked.  Performed at [facility], [address], [place], [place] [address]   MRSA Next Gen by PCR, Nasal     Status: None   Collection Time: 11/01/22 11:17 AM   Specimen: Nasal Mucosa; Nasal Swab  Result Value Ref Range Status   MRSA by PCR Next Gen NOT DETECTED NOT DETECTED Final    Comment: (NOTE) The GeneXpert MRSA Assay (FDA approved for NASAL specimens only), is one component of a comprehensive MRSA colonization  surveillance program. It is not intended to diagnose MRSA infection nor to guide or monitor treatment for MRSA infections. Test performance is not FDA approved in patients less than 40 years old. Performed at [facility], [address], [place], [place] [address]       Radiology Studies: No results found.     LOS: 6 days   Starr Engel [employer] on [email or web address]  11/05/2022, 8:28 AM

## 2022-11-06 DIAGNOSIS — T17908A Unspecified foreign body in respiratory tract, part unspecified causing other injury, initial encounter: Secondary | ICD-10-CM | POA: Diagnosis not present

## 2022-11-06 DIAGNOSIS — J9601 Acute respiratory failure with hypoxia: Secondary | ICD-10-CM | POA: Diagnosis not present

## 2022-11-06 DIAGNOSIS — R609 Edema, unspecified: Secondary | ICD-10-CM | POA: Diagnosis not present

## 2022-11-06 LAB — BASIC METABOLIC PANEL
Anion gap: 5 (ref 5–15)
BUN: 15 mg/dL (ref 8–23)
CO2: 28 mmol/L (ref 22–32)
Calcium: 7.8 mg/dL — ABNORMAL LOW (ref 8.9–10.3)
Chloride: 105 mmol/L (ref 98–111)
Creatinine, Ser: 1.41 mg/dL — ABNORMAL HIGH (ref 0.61–1.24)
GFR, Estimated: 52 mL/min — ABNORMAL LOW (ref >60)
Glucose, Bld: 128 mg/dL — ABNORMAL HIGH (ref 70–99)
Potassium: 3.9 mmol/L (ref 3.5–5.1)
Sodium: 138 mmol/L (ref 135–145)

## 2022-11-06 LAB — CBC
HCT: 25.9 % — ABNORMAL LOW (ref 39.0–52.0)
Hemoglobin: 8.1 g/dL — ABNORMAL LOW (ref 13.0–17.0)
MCH: 27 pg (ref 26.0–34.0)
MCHC: 31.3 g/dL (ref 30.0–36.0)
MCV: 86.3 fL (ref 80.0–100.0)
Platelets: 386 10*3/uL (ref 150–400)
RBC: 3 MIL/uL — ABNORMAL LOW (ref 4.22–5.81)
RDW: 15.9 % — ABNORMAL HIGH (ref 11.5–15.5)
WBC: 6.8 10*3/uL (ref 4.0–10.5)
nRBC: 0 % (ref 0.0–0.2)

## 2022-11-06 LAB — GLUCOSE, CAPILLARY
Glucose-Capillary: 110 mg/dL — ABNORMAL HIGH (ref 70–99)
Glucose-Capillary: 119 mg/dL — ABNORMAL HIGH (ref 70–99)
Glucose-Capillary: 127 mg/dL — ABNORMAL HIGH (ref 70–99)
Glucose-Capillary: 130 mg/dL — ABNORMAL HIGH (ref 70–99)
Glucose-Capillary: 137 mg/dL — ABNORMAL HIGH (ref 70–99)
Glucose-Capillary: 153 mg/dL — ABNORMAL HIGH (ref 70–99)
Glucose-Capillary: 166 mg/dL — ABNORMAL HIGH (ref 70–99)

## 2022-11-06 MED ORDER — JEVITY 1.5 CAL/FIBER PO LIQD
1000.0000 mL | ORAL | Status: DC
  Administered 2022-11-06: 1000 mL
  Filled 2022-11-06 (×2): qty 1000

## 2022-11-06 NOTE — Progress Notes (Signed)
[facility] PROGRESS NOTE   Harry [name] ZOX:[id number] DOB: [birth year] DOA: 10/30/2022  PCP: Crist Fat, MD  Brief History/Interval Summary: 77 y.o. male with a known history of CVA, CKD, hypertension, hyperlipidemia, Lewy body dementia peripheral vascular disease, CVA and type 2 diabetes presents to the emergency department for evaluation of respiratory distress.  Patient was discharged from the hospital on 10/30/22 to [facility] after admission for anemia requiring transfusion, aspiration pneumonia and bilateral LE pressure ulcers.  He was at the facility for one hour and was found to be hypoxic into the 70s and in respiratory distress.  CPAP was started as well as a nebulizer.  Of note patient has indwelling foley and GTube.   Consultants: Pulmonology.  Palliative care  Procedures: None yet    Subjective/Interval History: Patient noted to be awake and alert.  Denies any complaints.  Not very communicative.     Assessment/Plan:  Acute respiratory failure with hypoxemia/recurrent aspiration pneumonia Was sent back from skilled nursing facility since he was noted to be hypoxic.  Initially on 3 L of oxygen via nasal cannula.  Chest x-ray suggested congestive heart failure.  Patient was hospitalized.  Patient was given furosemide.   COVID-19, influenza and RSV PCR's were negative. On the morning of 8/29 patient was noted to be significantly hypoxic.  Chest x-ray showed whiteout of the left lung. Patient likely experiencing mucous plugging because of aspiration. Pulmonology was consulted.  They have had multiple discussions with patient's daughter.  At this time supportive care has been provided.  No plans for procedures.  Patient's CODE STATUS was changed over to DNR/DNI by his daughter. Broad-spectrum antibiotic coverage was also initiated by pulmonology.  Patient was initially on vancomycin and Zosyn.  Currently only on Zosyn.  Will plan a 7-day course of Zosyn. Chest  x-ray 8/30 shows improvement in the aeration of the left lung. He is oxygenation has improved in the last 48 hours.  He is now down to high flow nasal cannula at 8 L/min from heated high flow at 35 L from 45% FiO2.    Acute on chronic diastolic CHF Echocardiogram shows normal systolic function with grade 2 diastolic dysfunction.  Treated with furosemide initially.  Developed mucous plugging.  Furosemide was discontinued.   Received 2 doses of furosemide in the last 2 days for edema with good diuresis.  Hold off on diuretics today.  Will reassess tomorrow.  Chronic anemia/chronic GI blood loss During recent hospitalization he was transfused PRBC.  There was concern for GI bleeding.  Gastroenterology was consulted who did not recommend any invasive studies due to high risk. Aspirin and Plavix were discontinued as he was having persistent dark tarry stools in the hospital. He was discharged on PPI and Carafate. Hemoglobin drifted down to 6.6 on 8/30.  He was transfused 1 unit of PRBC.  Improvement in hemoglobin noted.  Continue to monitor.  No evidence for overt bleeding.   Essential hypertension Continue to monitor closely.  Occasional high readings noted.  History of urinary retention with chronic indwelling Foley Continue Foley catheter.  Continue Flomax.  History of stroke Aspirin and Plavix has been held since previous admission due to GI bleed.  History of Lewy body dementia/acute metabolic encephalopathy Patient was quite encephalopathic.  Seems to be improving.  Baseline mentation is poor to begin with.  Hyperlipidemia Continue statin.  History of diabetes hypoglycemia HbA1c 6.5 in March.  Low glucose levels noted.  Likely due to tube feedings being held.  Required D10 briefly.  Tube feedings were reinitiated.  CBGs have stabilized.  D10 was discontinued.    Chronic dysphagia with PEG tube Tube feedings have been resumed.   Daughter asking if the PEG tube can be changed over to  GJ tube.  Since patient's oxygenation appears to be improving we will request IR to see the patient to see if he would be a candidate for this revision.  Daughter was also inquiring about TPN.  She was told that TPN is usually given to patient's whose GI tract is nonfunctional.  It also introduces higher risk of developing infections.  This was communicated to the daughter and she understands.  Chronic kidney disease stage IIIa Renal function close to baseline.  Goals of care Long discussions with patient's daughter by pulmonology and palliative medicine.  After much thought she has decided to transition patient to DNR/DNI.  Continue other care for now.  DVT Prophylaxis: SCDs only for now.  Code Status: Full code Family Communication: Daughter being updated daily. Disposition Plan: SNF when medically stable  Status is: Inpatient Remains inpatient appropriate because: Acute respiratory failure with hypoxia      Medications: Scheduled:  albuterol  2.5 mg Nebulization BID   Chlorhexidine Gluconate Cloth  6 each Topical Daily   docusate  100 mg Per Tube BID   feeding supplement (PROSource TF20)  60 mL Per Tube Daily   ferrous gluconate  324 mg Oral QHS   free water  150 mL Per Tube Q4H   guaiFENesin  15 mL Per Tube Q4H   insulin aspart  0-9 Units Subcutaneous Q4H   multivitamin with minerals  1 tablet Per Tube Daily   nystatin  15 mL Mouth/Throat TID   pantoprazole (PROTONIX) IV  40 mg Intravenous Q12H   polyvinyl alcohol  1 drop Both Eyes TID   pyridostigmine  30 mg Per Tube Q8H   rosuvastatin  5 mg Per Tube QHS   sodium chloride flush  3 mL Intravenous Q12H   tamsulosin  0.4 mg Oral Daily   Continuous:  sodium chloride     feeding supplement (JEVITY 1.5 CAL/FIBER) 1,000 mL (11/05/22 2323)   piperacillin-tazobactam (ZOSYN)  IV 3.375 g (11/06/22 0338)   HQI:ONGEXB chloride, acetaminophen, albuterol, ipratropium-albuterol, ondansetron (ZOFRAN) IV, sodium chloride  flush  Antibiotics: Anti-infectives (From admission, onward)    Start     Dose/Rate Route Frequency Ordered Stop   11/02/22 2300  vancomycin (VANCOREADY) IVPB 1750 mg/350 mL  Status:  Discontinued        1,750 mg 175 mL/hr over 120 Minutes Intravenous Every 24 hours 11/02/22 0837 11/02/22 1252   11/02/22 1400  vancomycin (VANCOREADY) IVPB 1250 mg/250 mL  Status:  Discontinued        1,250 mg 166.7 mL/hr over 90 Minutes Intravenous Every 24 hours 11/01/22 1206 11/02/22 0837   11/01/22 2000  piperacillin-tazobactam (ZOSYN) IVPB 3.375 g        3.375 g 12.5 mL/hr over 240 Minutes Intravenous Every 8 hours 11/01/22 1206 11/08/22 1959   11/01/22 1230  vancomycin (VANCOREADY) IVPB 1500 mg/300 mL        1,500 mg 150 mL/hr over 120 Minutes Intravenous  Once 11/01/22 1122 11/01/22 1517   11/01/22 1200  piperacillin-tazobactam (ZOSYN) IVPB 3.375 g        3.375 g 100 mL/hr over 30 Minutes Intravenous  Once 11/01/22 1122 11/01/22 1307       Objective:  Vital Signs  Vitals:   11/05/22 1131 11/05/22 2005  11/06/22 0007 11/06/22 0331  BP: (!) 164/72 (!) 169/73 (!) 162/77 (!) 167/89  Pulse:  87 89 90  Resp: 20 (!) 29 (!) 22 (!) 23  Temp:  98.5 F (36.9 C) 98.2 F (36.8 C) 97.9 F (36.6 C)  TempSrc:  Oral Oral Oral  SpO2:  99% 94% 96%  Weight:    79.4 kg  Height:        Intake/Output Summary (Last 24 hours) at 11/06/2022 0825 Last data filed at 11/06/2022 0338 Gross per 24 hour  Intake 1000 ml  Output 3200 ml  Net -2200 ml   Filed Weights   11/04/22 0630 11/05/22 0405 11/06/22 0331  Weight: 84.1 kg 79.7 kg 79.4 kg    General appearance: Awake alert.  In no distress.  Distracted Resp: Mildly tachypneic.  Diminished air entry at the bases but improved with the upper lung expansion bilaterally.  Few crackles.  No wheezing. Cardio: S1-S2 is normal regular.  No S3-S4.  No rubs murmurs or bruit GI: Abdomen is soft.  Nontender nondistended.  Bowel sounds are present normal.  No masses  organomegaly.  PEG tube and Foley catheter is noted. Extremities: No edema.  Full range of motion of lower extremities.    Lab Results:  Data Reviewed: I have personally reviewed following labs and reports of the imaging studies  CBC: Recent Labs  Lab 10/30/22 1757 10/30/22 1810 11/01/22 1057 11/02/22 0728 11/02/22 1438 11/03/22 0303 11/04/22 0241 11/06/22 0429  WBC 6.8   < > 10.7* 10.3  --  9.0 7.0 6.8  NEUTROABS 4.9  --   --   --   --   --   --   --   HGB 7.1*   < > 7.0* 6.6* 8.0* 7.8* 7.5* 8.1*  HCT 23.0*   < > 21.8* 21.2* 25.1* 24.6* 23.6* 25.9*  MCV 90.6   < > 86.5 88.0  --  86.0 88.1 86.3  PLT 266   < > 363 362  --  379 356 386   < > = values in this interval not displayed.    Basic Metabolic Panel: Recent Labs  Lab 11/01/22 0313 11/02/22 0728 11/03/22 0303 11/04/22 0241 11/06/22 0429  NA 141 144 142 135 138  K 3.9 4.0 3.6 3.5 3.9  CL 105 107 105 102 105  CO2 27 25 24 23 28   GLUCOSE 203* 123* 71 272* 128*  BUN 33* 34* 27* 22 15  CREATININE 1.50* 1.62* 1.50* 1.38* 1.41*  CALCIUM 7.9* 8.0* 8.0* 7.3* 7.8*    GFR: Estimated Creatinine Clearance: 45.3 mL/min (A) (by C-G formula based on SCr of 1.41 mg/dL (H)).  Liver Function Tests: Recent Labs  Lab 10/30/22 1757  AST 17  ALT 18  ALKPHOS 74  BILITOT 0.5  PROT 6.2*  ALBUMIN 2.1*    CBG: Recent Labs  Lab 11/05/22 1659 11/05/22 2005 11/06/22 0005 11/06/22 0340 11/06/22 0801  GLUCAP 169* 114* 127* 130* 110*     Recent Results (from the past 240 hour(s))  Resp panel by RT-PCR (RSV, Flu A&B, Covid) Anterior Nasal Swab     Status: None   Collection Time: 10/30/22  5:47 PM   Specimen: Anterior Nasal Swab  Result Value Ref Range Status   SARS Coronavirus 2 by RT PCR NEGATIVE NEGATIVE Final   Influenza A by PCR NEGATIVE NEGATIVE Final   Influenza B by PCR NEGATIVE NEGATIVE Final    Comment: (NOTE) The Xpert Xpress SARS-CoV-2/FLU/RSV plus assay is intended as an  aid in the diagnosis of  influenza from Nasopharyngeal swab specimens and should not be used as a sole basis for treatment. Nasal washings and aspirates are unacceptable for Xpert Xpress SARS-CoV-2/FLU/RSV testing.  Fact Sheet for Patients: BloggerCourse.com  Fact Sheet for Healthcare Providers: SeriousBroker.it  This test is not yet approved or cleared by the [place] FDA and has been authorized for detection and/or diagnosis of SARS-CoV-2 by FDA under an Emergency Use Authorization (EUA). This EUA will remain in effect (meaning this test can be used) for the duration of the COVID-19 declaration under Section 564(b)(1) of the Act, 21 U.S.C. section 360bbb-3(b)(1), unless the authorization is terminated or revoked.     Resp Syncytial Virus by PCR NEGATIVE NEGATIVE Final    Comment: (NOTE) Fact Sheet for Patients: BloggerCourse.com  Fact Sheet for Healthcare Providers: SeriousBroker.it  This test is not yet approved or cleared by the [place] FDA and has been authorized for detection and/or diagnosis of SARS-CoV-2 by FDA under an Emergency Use Authorization (EUA). This EUA will remain in effect (meaning this test can be used) for the duration of the COVID-19 declaration under Section 564(b)(1) of the Act, 21 U.S.C. section 360bbb-3(b)(1), unless the authorization is terminated or revoked.  Performed at [facility], [address] [place], [place] [address]   MRSA Next Gen by PCR, Nasal     Status: None   Collection Time: 11/01/22 11:17 AM   Specimen: Nasal Mucosa; Nasal Swab  Result Value Ref Range Status   MRSA by PCR Next Gen NOT DETECTED NOT DETECTED Final    Comment: (NOTE) The GeneXpert MRSA Assay (FDA approved for NASAL specimens only), is one component of a comprehensive MRSA colonization surveillance program. It is not intended to diagnose MRSA infection nor to guide or  monitor treatment for MRSA infections. Test performance is not FDA approved in patients less than 16 years old. Performed at [facility], [address] [place], [place] [address]       Radiology Studies: No results found.     LOS: 7 days   Jakaya Jacobowitz [employer] on [email or web address]  11/06/2022, 8:25 AM

## 2022-11-06 NOTE — Progress Notes (Signed)
Nutrition Follow-up  DOCUMENTATION CODES:   Not applicable  INTERVENTION:  Tube feeding via PEG: Advance to Jevity 1.5 at 54ml/hr   Once able to advance further, recommend: Advance by 10ml q12h to a goal rate of 52ml/hr ( per day) 60ml ProSource tube feeding once daily Continue free water flushes q4h  Goal tube feeding regimen provides 2240 kcal, 112g protein, total free water faily (TF + FWF)  NUTRITION DIAGNOSIS:   Increased nutrient needs related to acute illness, wound healing as evidenced by estimated needs. - remains applicable  GOAL:   Patient will meet greater than or equal to 90% of their needs - goal unmet, addressing via tube feeding  MONITOR:   Labs, Weight trends, TF tolerance, Skin  REASON FOR ASSESSMENT:   Consult Assessment of nutrition requirement/status  ASSESSMENT:   Pt admitted with acute hypoxic respiratory failure 2/2 new onset CHF. PMH significant for CVA, CKD, HTN, HLD, lewy body dementia, PVD, CVA, T2DM. Pt recently discharged to [facility] following admission for anemia, aspiration PNA and bilateral LE pressure ulcers.  Pt continues with medical management/abx for recurrent aspiration PNA.   Attempted to speak with pt at bedside. Pt unable to provide nutrition related history. When RD announced self, pt responded with "uh-uh." No family present at time of visit.    Tube feeding continues infusing at rate of 62ml/hr. Per MD note, since oxygenation improving plans to request IR evaluation for conversion of G-tube to a GJ-tube.   Called and spoke with pt's daughter via phone call to obtain nutrition related history. She states that he was eating when he was discharged on July 15. He went to [facility] and was having micro aspirations therefore, a PEG tube was placed July 26th. He was started on TF however remained on a diet as well. Pt became sick about 2 days after PEG tube placement. He was then admitted to [facility] July 29th-August  13. He was then strictly on tube feeding. Pt was discharged to [facility] and worked with ST where they cleared him for nectar thick liquids. He was readmitted on August 17th and then back to [facility].  She is concerned as each time he is back to goal tube feeding rate he has recurrence of PNA. She also reports that he continues with inability to manage thick, tan colored secretions which has worsened over time. Suspect pt's inability to properly manage secretions is likely cause of aspiration however it is difficult to determine whether or not tube feeding is in part contributing to aspiration events as well.   Discussed importance of trying to titrate tube feeding back up to meet nutrition needs to prevent further weight loss and muscle/fat wasting. Explained pt has been receiving free water flushes of q4h without adverse effects. Pt's daughter agreeable to increasing tube feeding by 10ml (jevity 1.5 at 68ml/hr) for now. Reached out to MD to inform him of change.   Admission weight history: 8/29: 81.6 kg 9/3: 79.4 kg  Medications: colace, fergon, SSI 0-9 units q4h, MVI, protonix,   Labs: Cr 1.41, GFR 52, GFR 110-169 x24 hours  NUTRITION - FOCUSED PHYSICAL EXAM:  Flowsheet Row Most Recent Value  Orbital Region No depletion  Upper Arm Region Mild depletion  Thoracic and Lumbar Region No depletion  Buccal Region Mild depletion  Temple Region No depletion  Clavicle Bone Region No depletion  Clavicle and Acromion Bone Region No depletion  Scapular Bone Region No depletion  Dorsal Hand Mild depletion  Patellar Region Moderate depletion  Anterior Thigh Region Severe depletion  Posterior Calf Region Unable to assess  [in prevalon boot]  Edema (RD Assessment) Mild  Hair Reviewed  Eyes Unable to assess  Mouth Unable to assess  Skin Reviewed  Nails Reviewed       Diet Order:   Diet Order             Diet NPO time specified  Diet effective now                    EDUCATION NEEDS:   No education needs have been identified at this time  Skin:  Skin Integrity Issues:: DTI, Unstageable DTI: R heel Unstageable: Mid sacrum  Last BM:  9/3 (type 7 small)  Height:   Ht Readings from Last 1 Encounters:  10/30/22 5\' 9"  (1.753 m)    Weight:   Wt Readings from Last 1 Encounters:  11/06/22 79.4 kg   BMI:  Body mass index is 25.85 kg/m.  Estimated Nutritional Needs:   Kcal:  2100-2300  Protein:  105-115g  Fluid:  >/=2L  Drusilla Kanner, RDN, LDN Clinical Nutrition

## 2022-11-07 ENCOUNTER — Inpatient Hospital Stay (HOSPITAL_COMMUNITY): Payer: Medicare Other

## 2022-11-07 DIAGNOSIS — D649 Anemia, unspecified: Secondary | ICD-10-CM

## 2022-11-07 DIAGNOSIS — I10 Essential (primary) hypertension: Secondary | ICD-10-CM

## 2022-11-07 DIAGNOSIS — N1832 Chronic kidney disease, stage 3b: Secondary | ICD-10-CM

## 2022-11-07 DIAGNOSIS — T17908A Unspecified foreign body in respiratory tract, part unspecified causing other injury, initial encounter: Secondary | ICD-10-CM | POA: Diagnosis not present

## 2022-11-07 DIAGNOSIS — E1169 Type 2 diabetes mellitus with other specified complication: Secondary | ICD-10-CM

## 2022-11-07 DIAGNOSIS — E785 Hyperlipidemia, unspecified: Secondary | ICD-10-CM

## 2022-11-07 DIAGNOSIS — I5033 Acute on chronic diastolic (congestive) heart failure: Secondary | ICD-10-CM | POA: Diagnosis not present

## 2022-11-07 HISTORY — PX: IR GASTR TUBE CONVERT GASTR-JEJ PER W/FL MOD SED: IMG2332

## 2022-11-07 LAB — BASIC METABOLIC PANEL
Anion gap: 6 (ref 5–15)
BUN: 16 mg/dL (ref 8–23)
CO2: 28 mmol/L (ref 22–32)
Calcium: 7.8 mg/dL — ABNORMAL LOW (ref 8.9–10.3)
Chloride: 102 mmol/L (ref 98–111)
Creatinine, Ser: 1.22 mg/dL (ref 0.61–1.24)
GFR, Estimated: >60 mL/min (ref >60)
Glucose, Bld: 150 mg/dL — ABNORMAL HIGH (ref 70–99)
Potassium: 3.9 mmol/L (ref 3.5–5.1)
Sodium: 136 mmol/L (ref 135–145)

## 2022-11-07 LAB — CBC
HCT: 25.4 % — ABNORMAL LOW (ref 39.0–52.0)
Hemoglobin: 8 g/dL — ABNORMAL LOW (ref 13.0–17.0)
MCH: 27 pg (ref 26.0–34.0)
MCHC: 31.5 g/dL (ref 30.0–36.0)
MCV: 85.8 fL (ref 80.0–100.0)
Platelets: 371 10*3/uL (ref 150–400)
RBC: 2.96 MIL/uL — ABNORMAL LOW (ref 4.22–5.81)
RDW: 15.7 % — ABNORMAL HIGH (ref 11.5–15.5)
WBC: 5.6 10*3/uL (ref 4.0–10.5)
nRBC: 0 % (ref 0.0–0.2)

## 2022-11-07 LAB — GLUCOSE, CAPILLARY
Glucose-Capillary: 134 mg/dL — ABNORMAL HIGH (ref 70–99)
Glucose-Capillary: 136 mg/dL — ABNORMAL HIGH (ref 70–99)
Glucose-Capillary: 138 mg/dL — ABNORMAL HIGH (ref 70–99)
Glucose-Capillary: 146 mg/dL — ABNORMAL HIGH (ref 70–99)
Glucose-Capillary: 156 mg/dL — ABNORMAL HIGH (ref 70–99)
Glucose-Capillary: 162 mg/dL — ABNORMAL HIGH (ref 70–99)
Glucose-Capillary: 164 mg/dL — ABNORMAL HIGH (ref 70–99)

## 2022-11-07 MED ORDER — PIPERACILLIN-TAZOBACTAM 3.375 G IVPB
3.3750 g | Freq: Three times a day (TID) | INTRAVENOUS | Status: AC
  Administered 2022-11-07 – 2022-11-08 (×2): 3.375 g via INTRAVENOUS
  Filled 2022-11-07 (×2): qty 50

## 2022-11-07 MED ORDER — LIDOCAINE VISCOUS HCL 2 % MT SOLN
OROMUCOSAL | Status: AC
  Filled 2022-11-07: qty 15

## 2022-11-07 MED ORDER — IOHEXOL 300 MG/ML  SOLN
50.0000 mL | Freq: Once | INTRAMUSCULAR | Status: AC | PRN
  Administered 2022-11-07: 50 mL

## 2022-11-07 MED ORDER — IOHEXOL 300 MG/ML  SOLN
100.0000 mL | Freq: Once | INTRAMUSCULAR | Status: AC | PRN
  Administered 2022-11-07: 70 mL

## 2022-11-07 MED ORDER — ORAL CARE MOUTH RINSE: 15.0000 mL | OROMUCOSAL | Status: DC | PRN

## 2022-11-07 MED ORDER — ORAL CARE MOUTH RINSE
15.0000 mL | OROMUCOSAL | Status: DC
  Administered 2022-11-07 – 2022-11-12 (×20): 15 mL via OROMUCOSAL

## 2022-11-07 NOTE — Assessment & Plan Note (Addendum)
Systolic blood pressure 160 to 180 mmHg.  Resume amlodipine at 10 mg daily.

## 2022-11-07 NOTE — Procedures (Signed)
Interventional Radiology Procedure Note  Procedure: Gastrostomy tube exchange to gastrojejunostomy tube.  Findings: Please refer to procedural dictation for full description.  26 Fr gastrojejunostomy, tip in proximal jejunum.   Complications: None immediate  Estimated Blood Loss: < 5 mL  Recommendations: Tube ready for immediate use.   Marliss Coots, MD

## 2022-11-07 NOTE — Assessment & Plan Note (Addendum)
Patient required one unit PRBC transfusion on 08/30 No signs of active bleeding.   Iron panel consistent with iron deficiency anemia combined with anemia of chronic disease. Serum iron 16, TIBC 153, ferritin 195, transferrin 105 and transferrin saturation 11.   Sp IV iron infusion, 2 doses of ferric gluconate.   Follow up hgb is 8,0  Follow up iron panel and hgb in 3 weeks.

## 2022-11-07 NOTE — Hospital Course (Addendum)
Harry [name] was admitted to the hospital with the working diagnosis of recurrent aspiration complicated with respiratory failure.   77 y.o. male with a known history of CVA, CKD, hypertension, hyperlipidemia, Lewy body dementia peripheral vascular disease, CVA and type 2 diabetes presents to the emergency department for evaluation of respiratory distress.  Patient was discharged from the hospital on 10/30/22 to [facility] after admission for anemia requiring transfusion, aspiration pneumonia and bilateral LE pressure ulcers.  He was at the facility for one hour and was found to be hypoxic into the 70s and in respiratory distress.  CPAP was started as well as a nebulizer.  Of note patient has indwelling foley and GTube.  On his initial physical examination his blood pressure was 129/68, HR 80, RR 27 and 02 saturation 86% on room air, lungs with rales, rhonchi and increased work of breathing, heart with S1 and S2 present and regular, abdomen with no distention, PEG tube in place, lower extremities in protection boots. Patient was alert and orientated x1 only.   Na 141, K 3,6 Cl 107 bicarbonate 23, glucose 230, bun 29 cr 1,16  BNP 511 Wbc 6,8 hgb 7.1 plt 266  TSH 4,750 Sars covid negative  Urine analysis SG 1,013, protein 30, negative leukocytes and negative hgb.   CT head with no acute intracranial abnormalities.  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   Chest radiograph with bilateral interstitial infiltrates right upper lobe and left lower lobe.   EKG 84 bpm, normal axis, normal intervals, sinus rhythm with poor R R wave progression, q wave V1 and V2 with no significant ST segment or  T wave changes.   Patient was placed on IV antibiotic therapy.   8/29 patient was noted to be significantly hypoxic respiratory failure. Chest x-ray showed whiteout of the left lung. Patient likely experiencing mucous plugging because of aspiration. Pulmonology was consulted. They have  had multiple discussions with patient's daughter. At this time supportive care has been provided. No plans for procedures. Patient's CODE STATUS was changed over to DNR/DNI by his daughter.   09/04 Gastric tube was exchanged per IR 09/05 patient with 02 desaturation and requiring high flow oxygen per non re-breather.  Chest radiograph with persistent bilateral interstitial infiltrates.  09/06 more awake and alert. Improved 02 requirements to nasal cannula.  09/07 Stable oxygenation. Plan for possible transfer back to SNF on 11/12/22.  09/08 continue to improved, today he is on room air with good toleration. Plan to transfer to SNF tomorrow.  09/09 patient is stable for transfer to SNF.

## 2022-11-07 NOTE — Assessment & Plan Note (Addendum)
Continue insulin sliding scal for glucose cover and monitoring.  His glucose has been stable, hypoglycemia has resolved.  Tolerating well tube feedings.  Continue with statin therapy.

## 2022-11-07 NOTE — Assessment & Plan Note (Addendum)
Patient with poor mobility and poor communication Continue supportive medical care. Poor prognosis and high risk for medical complications.  Continue aspirin and clopidogrel. Continue with statin.   Urinary retention, with chronic indwelling foley catheter.

## 2022-11-07 NOTE — Consult Note (Signed)
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 ductal dilatation or surrounding inflammatory changes. Spleen: Normal in size without focal abnormality. Adrenals/Urinary Tract: The adrenal glands are unremarkable. The kidneys are normal in size and position. Simple cortical cyst noted within the lower pole right kidney for which no follow-up imaging is recommended. The kidneys are otherwise unremarkable. Foley catheter balloon is seen within a largely decompressed bladder lumen. There is circumferential marked bladder wall thickening suggesting changes of chronic bladder outlet obstruction, similar to prior examination. Stomach/Bowel: Previously noted button type PEG tube has been exchanged for a balloon retention gastrostomy catheter which is in expected position within the gastric lumen. Infiltration along the tract of the gastrostomy catheter appears improved since prior examination related to leakage or inflammation along the tract. Moderate fluid is again seen within the subcutaneous soft tissues of the left lateral  abdominal wall as well as within the fascial planes of the lateral abdominal wall musculature, increased since prior examination. Stomach, small bowel, and large bowel are otherwise unremarkable. Appendix normal. No free intraperitoneal gas or fluid. Vascular/Lymphatic: Extensive atherosclerotic calcification within the aortoiliac vasculature. No aortic aneurysm. No pathologic adenopathy within the abdomen and pelvis. Reproductive: Marked prostatic hypertrophy. Other: Mild diffuse subcutaneous edema again noted within the gluteal soft tissues and visualized lower extremities bilaterally. Musculoskeletal: No acute bone abnormality. No lytic or blastic bone lesion. Review of the MIP images confirms the above findings. IMPRESSION: 1. No pulmonary embolism. 2. Extensive endoluminal debris within the tracheobronchial tree with near complete impaction of the left bronchial tree and scattered airway impaction within the right lower lobe. Bibasilar pulmonary infiltrates. Together, the findings suggest changes of aspiration. 3. Small bilateral pleural effusions, new since prior examination but improved since CT examination of the abdomen pelvis of 10/08/2022. 4. Moderate coronary artery calcification. 5. Cholelithiasis. 6. Marked prostatic hypertrophy. Changes of chronic bladder outlet obstruction with Foley decompression of the bladder. 7. Interval gastrostomy exchange. Mild subcutaneous infiltration along the gastrostomy catheter tract and moderate fluid within the subcutaneous soft tissues of the left lateral abdominal wall as well as within the fascial planes of the lateral abdominal wall musculature may reflect changes of leakage along an immature tract and appears increased since prior examination. Aortic Atherosclerosis (ICD10-I70.0). Electronically Signed   By: Helyn Numbers M.D.   On: 10/21/2022 02:56   CT ABDOMEN PELVIS W CONTRAST  Result Date: 10/21/2022 CLINICAL DATA:  Pulmonary embolism (PE), high prob;  Abdominal pain, acute, nonlocalized EXAM: CT ANGIOGRAPHY CHEST CT ABDOMEN AND PELVIS WITH CONTRAST TECHNIQUE: Multidetector CT imaging of the chest was performed using the standard protocol during bolus administration of intravenous contrast. Multiplanar CT image reconstructions and MIPs were obtained to evaluate the vascular anatomy. Multidetector CT imaging of the abdomen and pelvis was performed using the standard protocol during bolus administration of intravenous contrast. RADIATION DOSE REDUCTION: This exam was performed according to the departmental dose-optimization program which includes automated exposure control, adjustment of the mA and/or kV according to patient size and/or use of iterative reconstruction technique. CONTRAST:  OMNIPAQUE IOHEXOL 350 MG/ML SOLN COMPARISON:  CT chest 09/06/2022, CT abdomen pelvis 10/08/2022. FINDINGS: CTA CHEST FINDINGS Cardiovascular: There is adequate opacification of the pulmonary arterial tree. No  intraluminal filling defect identified to suggest acute pulmonary embolism. Central pulmonary arteries are of normal caliber. Moderate coronary artery calcification. Global cardiac size within normal limits. No pericardial effusion. Mild atherosclerotic calcification within the thoracic aorta. No aortic aneurysm. Mediastinum/Nodes: Visualized thyroid is unremarkable. Stable shotty right paratracheal and bilateral hilar adenopathy, nonspecific, possibly reactive in nature. The esophagus is unremarkable. Lungs/Pleura: There is extensive and luminal debris within the tracheobronchial tree with near complete impaction of the left bronchial tree and scattered airway impaction within the right lower lobe. Bibasilar pulmonary infiltrates are present. Together, the findings suggest changes of aspiration. Small bilateral pleural effusions are present, new since prior examination. No pneumothorax. Musculoskeletal: No acute bone abnormality. No lytic or blastic bone lesion. Review  of the MIP images confirms the above findings. CT ABDOMEN and PELVIS FINDINGS Hepatobiliary: Cholelithiasis without pericholecystic inflammatory change. Tiny cyst within the right hepatic dome. Liver otherwise unremarkable. No intra or extrahepatic biliary ductal dilation. Pancreas: Unremarkable. No pancreatic ductal dilatation or surrounding inflammatory changes. Spleen: Normal in size without focal abnormality. Adrenals/Urinary Tract: The adrenal glands are unremarkable. The kidneys are normal in size and position. Simple cortical cyst noted within the lower pole right kidney for which no follow-up imaging is recommended. The kidneys are otherwise unremarkable. Foley catheter balloon is seen within a largely decompressed bladder lumen. There is circumferential marked bladder wall thickening suggesting changes of chronic bladder outlet obstruction, similar to prior examination. Stomach/Bowel: Previously noted button type PEG tube has been exchanged for a balloon retention gastrostomy catheter which is in expected position within the gastric lumen. Infiltration along the tract of the gastrostomy catheter appears improved since prior examination related to leakage or inflammation along the tract. Moderate fluid is again seen within the subcutaneous soft tissues of the left lateral abdominal wall as well as within the fascial planes of the lateral abdominal wall musculature, increased since prior examination. Stomach, small bowel, and large bowel are otherwise unremarkable. Appendix normal. No free intraperitoneal gas or fluid. Vascular/Lymphatic: Extensive atherosclerotic calcification within the aortoiliac vasculature. No aortic aneurysm. No pathologic adenopathy within the abdomen and pelvis. Reproductive: Marked prostatic hypertrophy. Other: Mild diffuse subcutaneous edema again noted within the gluteal soft tissues and visualized lower extremities bilaterally. Musculoskeletal: No acute bone abnormality. No lytic  or blastic bone lesion. Review of the MIP images confirms the above findings. IMPRESSION: 1. No pulmonary embolism. 2. Extensive endoluminal debris within the tracheobronchial tree with near complete impaction of the left bronchial tree and scattered airway impaction within the right lower lobe. Bibasilar pulmonary infiltrates. Together, the findings suggest changes of aspiration. 3. Small bilateral pleural effusions, new since prior examination but improved since CT examination of the abdomen pelvis of 10/08/2022. 4. Moderate coronary artery calcification. 5. Cholelithiasis. 6. Marked prostatic hypertrophy. Changes of chronic bladder outlet obstruction with Foley decompression of the bladder. 7. Interval gastrostomy exchange. Mild subcutaneous infiltration along the gastrostomy catheter tract and moderate fluid within the subcutaneous soft tissues of the left lateral abdominal wall as well as within the fascial planes of the lateral abdominal wall musculature may reflect changes of leakage along an immature tract and appears increased since prior examination. Aortic Atherosclerosis (ICD10-I70.0). Electronically Signed   By: Helyn Numbers M.D.   On: 10/21/2022 02:56   DG Chest 2 View  Result Date: 10/20/2022 CLINICAL DATA:  Cough.  Recently diagnosed with pneumonia. EXAM: CHEST - 2 VIEW COMPARISON:  10/02/2022, CT abdomen 10/08/2022 FINDINGS: Lungs are adequately inflated demonstrate hazy left base/retrocardiac opacification likely due  to small effusion with possible associated atelectasis. Hazy opacification over the right infrahilar region which may be due to asymmetric edema versus infection. Overall interval improved bibasilar opacification likely improved effusions. Cardiomediastinal silhouette and remainder of the exam is unchanged. IMPRESSION: 1. Improved hazy left base/retrocardiac opacification likely due to small effusion with possible associated atelectasis. 2. Improved hazy opacification over the  right infrahilar region which may be due to asymmetric edema, atelectasis or infection. Electronically Signed   By: Elberta Fortis M.D.   On: 10/20/2022 11:41   EEG adult  Result Date: 10/15/2022 Charlsie Quest, MD     10/15/2022  4:45 PM Patient Name: Harry [name] MRN: [id number] Epilepsy Attending: Charlsie Quest Referring Physician/Provider: Briant Cedar, MD Date: 10/15/2022 Duration: 24.50 mins Patient history: 77yo M with ams getting eeg to evaluate for seizure Level of alertness: Awake AEDs during EEG study: None Technical aspects: This EEG study was done with scalp electrodes positioned according to the 10-20 International system of electrode placement. Electrical activity was reviewed with band pass filter of 1-70Hz , sensitivity of 7 uV/mm, display speed of 36mm/sec with a 60Hz  notched filter applied as appropriate. EEG data were recorded continuously and digitally stored.  Video monitoring was available and reviewed as appropriate. Description:  The posterior dominant rhythm consists of 8 Hz activity of moderate voltage (25-35 uV) seen predominantly in posterior head regions, symmetric and reactive to eye opening and eye closing. Intermittent generalized 3-5Hz  theta-delta slowing was noted. Physiologic photic driving was not seen during photic stimulation.  Hyperventilation was not performed.    ABNORMALITY - Intermittent slow, generalized  IMPRESSION: This study is suggestive of mild diffuse encephalopathy, nonspecific etiology. No seizures or epileptiform discharges were seen throughout the recording.  Priyanka O Yadav   IR REPLACE G-TUBE SIMPLE WO FLUORO  Result Date: 10/11/2022 INDICATION: assess G tube site EXAM: EVALUATION AND EXCHANGE OF GASTROSTOMY TUBE COMPARISON:  CT AP, 10/08/2022. MEDICATIONS: None. CONTRAST:  10 mL Isovue-300-administered into the gastric lumen FLUOROSCOPY TIME:  None COMPLICATIONS: None immediate. PROCEDURE: Informed written consent was obtained from the  patient and/or patient's representative after a discussion of the risks, benefits and alternatives to treatment. Questions regarding the procedure were encouraged and answered. A timeout was performed prior to the initiation of the procedure. The upper abdomen and external portion of the existing gastrostomy tube was prepped and draped in the usual sterile fashion, and a sterile drape was applied covering the operative field. Maximum barrier sterile technique with sterile gowns and gloves were used for the procedure. A timeout was performed prior to the initiation of the procedure. The existing gastrostomy tube was removed and exchanged for a new 24 Fr balloon inflatable gastrostomy tube. The balloon was inflated with saline and dilute contrast and pulled against the anterior inner lumen of the stomach and the external disc was cinched. Contrast was injected and a post procedural spot fluoroscopic image was obtained confirming appropriate positioning and functionality of the new gastrostomy tube. A dressing was applied. The patient tolerated the procedure well without immediate postprocedural complication. IMPRESSION: Successful replacement and upsize for a new 24 Fr balloon-retention gastrostomy tube. The gastrostomy tube is ready for immediate use. PLAN: Patient will return to [facility] for routine gastrostomy tube evaluation and exchange in 6 months. Roanna Banning, MD [facility] Electronically Signed   By: Roanna Banning M.D.   On: 10/11/2022 14:28   DG Abd Portable 1V  Result Date: 10/10/2022  CLINICAL DATA:  14941 Gastrostomy status (HCC) 14941 EXAM: PORTABLE ABDOMEN - 1 VIEW COMPARISON:  KUB, 10/07/2022.  CT AP, 10/08/2022. FINDINGS: Support lines: Gastrostomy tube, with tip positioned at the gastric antrum. Intraluminal contrast opacification of the stomach, demonstrating rugae. Intraluminal colonic opacification with  contrast, likely previously ingested. Nonobstructed bowel-gas pattern. No acute osseous abnormality. IMPRESSION: 1. Well-positioned gastrostomy tube, with tip of the gastric antrum. 2. Nonobstructed bowel-gas pattern. Electronically Signed   By: Roanna Banning M.D.   On: 10/10/2022 16:59   CT ABDOMEN PELVIS WO CONTRAST  Result Date: 10/08/2022 CLINICAL DATA:  Abdominal pain EXAM: CT ABDOMEN AND PELVIS WITHOUT CONTRAST TECHNIQUE: Multidetector CT imaging of the abdomen and pelvis was performed following the standard protocol without IV contrast. RADIATION DOSE REDUCTION: This exam was performed according to the departmental dose-optimization program which includes automated exposure control, adjustment of the mA and/or kV according to patient size and/or use of iterative reconstruction technique. COMPARISON:  10/03/2022 FINDINGS: Lower chest: Moderate to large bilateral pleural effusions are seen. There are patchy infiltrates in the posterior mid and lower lung fields suggesting atelectasis/pneumonia. Coronary artery calcifications are seen. Hepatobiliary: There is no dilation of bile ducts. Subcentimeter low-density in the right lobe may suggest cysts or hemangioma. Calcified gallbladder stones are seen. Pancreas: No focal abnormalities are seen. Spleen: Unremarkable. Adrenals/Urinary Tract: Adrenals are unremarkable. There is no hydronephrosis. There are scattered calcifications seen renal artery branches. No definite renal or ureteral stones are seen. There is 1.7 cm cyst in the midportion of right kidney. There is diffuse wall thickening in the urinary bladder. Foley catheter is seen in the bladder. Stomach/Bowel: Stomach is not distended. Percutaneous gastrostomy tube is noted with its tip in the lumen of the stomach. There is subcutaneous stranding and fluid in the anterior abdominal wall at the point of entry of PEG tube. There is no loculated thick-walled fluid collections. There is no extravasation of oral  contrast outside the lumen of GI tract. Small bowel loops are not dilated. Appendix is not dilated. There is no significant focal wall thickening in colon. Oral contrast has reached the rectum. Vascular/Lymphatic: Calcifications are seen in aorta and its major branches. Reproductive: Prostate is enlarged. Other: There is no pneumoperitoneum. Minimal ascites. There is diffuse edema in subcutaneous plane in the chest wall suggesting anasarca. Musculoskeletal: No acute findings are seen. IMPRESSION: There is no evidence of intestinal obstruction or pneumoperitoneum. Peg tube is noted in usual position with its tip in the lumen of the stomach. There is no extravasation of oral contrast. Appendix is not dilated. There is no hydronephrosis. Moderate to large bilateral pleural effusions. Minimal ascites. There is diffuse edema in subcutaneous plane suggesting anasarca. Gallbladder stones. There is no dilation of bile ducts. Possible cysts in liver and right kidney. There are patchy infiltrates in the posterior lower lung fields suggesting compression atelectasis or pneumonia. Coronary artery calcifications are seen. Aortic arteriosclerosis. Electronically Signed   By: Ernie Avena M.D.   On: 10/08/2022 17:30   DG Swallowing Func-Speech Pathology  Result Date: 10/08/2022 Table formatting from the original result was not included. Modified Barium Swallow Study Patient Details Name: Harry [name] MRN: [id number] Date of Birth: Oct 12, 1945 Today's Date: 10/08/2022 HPI/PMH: HPI: Pt is a 77 yo male presenting 7/30 with dehydration and AKI. W/u concerning for cellulitis at PEG site. Pt recently d/c to [facility] 7/15 and PEG was placed there. Pt had MBS 09/10/22 with pharyngeal swallow functional but overall intake impacted by impaired oral manipulation and  significant delays in the onset of pharyngeal swallow. Aspiration occurred x1 and did elicit a cough response. Pt clinically had more difficulty due at least in part to  positioning, and Dys 1 diet with nectar thick liquids was recommended while inpatient. PMH includes: Lewy body dementia, PNA, stroke, DMII, CKD 3A, anemia, HTN, HLD, syncope, PAD s/p angioplasty and stenting, depression, glaucoma. Clinical Impression: Clinical Impression: Patient presents with a moderately impaired oral and pharyngeal swallow as per this modified barium swallow study. During oral phase, patient with inefficient lingual transit of bolus from anterior to posterior portion of oral cavity. Delayed oral phase led to significantly delayed swallow initiation during pharyngeal phase, such that barium consistencies of thin liquids, nectar thick liquids and honey thick liquids all entered into pyriform sinus and remained there until swallow was initiated, which was at times, as long as 16 seconds. Swallow initiation with puree/pudding thick barium was delayed at level of vallecular sinus. During this study, there was only one instance of sensed aspiration (PAS 7) with nectar thick liquids, however this was a moderate amount and occured when nectar thick barium flowed over epiglottis (which was curled inward towards base of tongue) and directly into open airway. Patient did exhibit cough response but this was not timely or productive enough to clear aspirate. Although no other instances of aspiration observed, patient is at high risk of this occuring with thin, nectar thick and honey thick secondary to the significant delay in swallow initiation that occured as well as the poorly timed epiglottic closure. No significant amount of oral or pharyngeal residuals were observed s/s swallows initiated. SLP to discuss GOC with MD and patient's daughter prior to making PO recommendations. Factors that may increase risk of adverse event in presence of aspiration Rubye Oaks [employer] Clearance Coots 2021): Factors that may increase risk of adverse event in presence of aspiration Rubye Oaks & Clearance Coots 2021): Reduced cognitive function; Limited  mobility; Weak cough; Dependence for feeding and/or oral hygiene Recommendations/Plan: Swallowing Evaluation Recommendations Swallowing Evaluation Recommendations Recommendations: Alternative means of nutrition - G Tube; NPO Postural changes: Position pt fully upright for meals Oral care recommendations: Oral care before ice chips/water; Oral care BID (2x/day) Caregiver Recommendations: Have oral suction available Treatment Plan Treatment Plan Treatment recommendations: Therapy as outlined in treatment plan below Follow-up recommendations: Skilled nursing-short term rehab (<3 hours/day) Functional status assessment: Patient has had a recent decline in their functional status and demonstrates the ability to make significant improvements in function in a reasonable and predictable amount of time. Treatment frequency: Min 2x/week Treatment duration: 2 weeks Interventions: Patient/family education; Trials of upgraded texture/liquids Recommendations Recommendations for follow up therapy are one component of a multi-disciplinary discharge planning process, led by the attending physician.  Recommendations may be updated based on patient status, additional functional criteria and insurance authorization. Assessment: Orofacial Exam: Orofacial Exam Oral Cavity: Oral Hygiene: WFL Oral Cavity - Dentition: Missing dentition Anatomy: Anatomy: WFL Boluses Administered: Boluses Administered Boluses Administered: Thin liquids (Level 0); Mildly thick liquids (Level 2, nectar thick); Moderately thick liquids (Level 3, honey thick); Puree  Oral Impairment Domain: Oral Impairment Domain Lip Closure: No labial escape Tongue control during bolus hold: Cohesive bolus between tongue to palatal seal Bolus preparation/mastication: Slow prolonged chewing/mashing with complete recollection Bolus transport/lingual motion: Repetitive/disorganized tongue motion Oral residue: Trace residue lining oral structures Location of oral residue : Tongue  Initiation of pharyngeal swallow : Pyriform sinuses  Pharyngeal Impairment Domain: Pharyngeal Impairment Domain Soft palate elevation: No bolus between soft palate (SP)/pharyngeal wall (  PW) Laryngeal elevation: Complete superior movement of thyroid cartilage with complete approximation of arytenoids to epiglottic petiole Anterior hyoid excursion: Complete anterior movement Epiglottic movement: Complete inversion Laryngeal vestibule closure: Incomplete, narrow column air/contrast in laryngeal vestibule Pharyngeal stripping wave : Present - complete Pharyngeal contraction (A/P view only): N/A Pharyngoesophageal segment opening: Complete distension and complete duration, no obstruction of flow Tongue base retraction: No contrast between tongue base and posterior pharyngeal wall (PPW) Pharyngeal residue: Complete pharyngeal clearance Location of pharyngeal residue: N/A  Esophageal Impairment Domain: Esophageal Impairment Domain Esophageal clearance upright position: Esophageal retention Pill: No data recorded Penetration/Aspiration Scale Score: Penetration/Aspiration Scale Score 1.  Material does not enter airway: Puree; Moderately thick liquids (Level 3, honey thick); Thin liquids (Level 0) 7.  Material enters airway, passes BELOW cords and not ejected out despite cough attempt by patient: Mildly thick liquids (Level 2, nectar thick) Compensatory Strategies: Compensatory Strategies Compensatory strategies: Yes Straw: Ineffective   General Information: Caregiver present: No  Diet Prior to this Study: NPO; G-tube   Temperature : Normal   Respiratory Status: WFL   Supplemental O2: None (Room air)   History of Recent Intubation: No  Behavior/Cognition: Alert; Cooperative; Requires cueing Self-Feeding Abilities: Dependent for feeding Baseline vocal quality/speech: Normal Volitional Cough: Unable to elicit Volitional Swallow: Able to elicit Exam Limitations: No limitations Goal Planning: Prognosis for improved oropharyngeal  function: Fair Barriers to Reach Goals: Severity of deficits; Cognitive deficits; Overall medical prognosis No data recorded Patient/Family Stated Goal: daughter reports that being able to eat and drink is important to his QOL Consulted and agree with results and recommendations: Pt unable/family or caregiver not available Pain: Pain Assessment Pain Assessment: Faces Faces Pain Scale: 0 Breathing: 0 Negative Vocalization: 2 Facial Expression: 2 Body Language: 2 Consolability: 1 PAINAD Score: 7 Facial Expression: 2 Body Movements: 1 Muscle Tension: 1 Compliance with ventilator (intubated pts.): N/A Vocalization (extubated pts.): 1 CPOT Total: 5 End of Session: Start Time:SLP Start Time (ACUTE ONLY): 0910 Stop Time: SLP Stop Time (ACUTE ONLY): 0930 Time Calculation:SLP Time Calculation (min) (ACUTE ONLY): 20 min Charges: SLP Evaluations $ SLP Speech Visit: 1 Visit SLP Evaluations $Swallowing Treatment: 1 Procedure SLP visit diagnosis: SLP Visit Diagnosis: Dysphagia, oropharyngeal phase (R13.12) Past Medical History: Past Medical History: Diagnosis Date  Acute bronchitis due to human metapneumovirus 06/05/2020  Acute ischemic stroke (HCC) 08/12/2018  Acute metabolic encephalopathy 06/03/2020  Acute pyelonephritis 06/30/2016  AKI (acute kidney injury) (HCC) 09/20/2016  CAP (community acquired pneumonia) 06/03/2020  CKD (chronic kidney disease), stage III (HCC)   Hyperlipidemia   Hypertension   Microalbuminuria   Peripheral neuropathy   Pneumonia 06/03/2020  PVD (peripheral vascular disease) (HCC)   a. 08/2014: directional atherectomy +  drug eluding balloon angioplasty on the left SFA. 09/2014: staged R SFA intervention with directional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

## 2022-11-07 NOTE — Progress Notes (Signed)
Patient returned to room from IR. RN called to room by unit secretary that patient was back on the unit. Patient withy minimal responsiveness, MD at bedside as well.No report received from IR. Vitals stable.

## 2022-11-07 NOTE — Assessment & Plan Note (Addendum)
Aspiration pneumonia, present on admission.   Patient has been on antibiotic therapy, broad spectrum, vancomycin and zosyn. He has completed his course of antibiotics with Zosyn.  09/05 episode of aspiration pneumonitis with increased 02 requirements, up to non re-breather mask. Today patient is off supplemental 02, on room air -02 saturation is 95%.   Plan to continue aspiration precautions, head elevated 30 degrees at all times.   Unfortunately his prognosis is poor due to recurrent aspiration episodes.  Acute hypoxemic respiratory failure, due to aspiration pneumonia, aspiration pneumonitis, left pleural effusion.  Oxygenation has improved, and 02 requirements have decreased.  Today he is on room air.

## 2022-11-07 NOTE — Plan of Care (Signed)
  Problem: Education: Goal: Knowledge of General Education information will improve Description: Including pain rating scale, medication(s)/side effects and non-pharmacologic comfort measures Outcome: Progressing   Problem: Health Behavior/Discharge Planning: Goal: Ability to manage health-related needs will improve Outcome: Progressing   Problem: Clinical Measurements: Goal: Ability to maintain clinical measurements within normal limits will improve Outcome: Progressing Goal: Will remain free from infection Outcome: Progressing Goal: Diagnostic test results will improve Outcome: Progressing Goal: Respiratory complications will improve Outcome: Progressing Goal: Cardiovascular complication will be avoided Outcome: Progressing   Problem: Activity: Goal: Risk for activity intolerance will decrease Outcome: Progressing   Problem: Nutrition: Goal: Adequate nutrition will be maintained Outcome: Progressing   Problem: Coping: Goal: Level of anxiety will decrease Outcome: Progressing   Problem: Elimination: Goal: Will not experience complications related to bowel motility Outcome: Progressing Goal: Will not experience complications related to urinary retention Outcome: Progressing   Problem: Pain Managment: Goal: General experience of comfort will improve Outcome: Progressing   Problem: Safety: Goal: Ability to remain free from injury will improve Outcome: Progressing   Problem: Skin Integrity: Goal: Risk for impaired skin integrity will decrease Outcome: Progressing   Problem: Education: Goal: Ability to describe self-care measures that may prevent or decrease complications (Diabetes Survival Skills Education) will improve Outcome: Progressing   Problem: Coping: Goal: Ability to adjust to condition or change in health will improve Outcome: Progressing   Problem: Fluid Volume: Goal: Ability to maintain a balanced intake and output will improve Outcome:  Progressing   Problem: Metabolic: Goal: Ability to maintain appropriate glucose levels will improve Outcome: Progressing   Problem: Nutritional: Goal: Maintenance of adequate nutrition will improve Outcome: Progressing   Problem: Skin Integrity: Goal: Risk for impaired skin integrity will decrease Outcome: Progressing   Problem: Tissue Perfusion: Goal: Adequacy of tissue perfusion will improve Outcome: Progressing

## 2022-11-07 NOTE — Assessment & Plan Note (Addendum)
Volume status has improved.  Renal function today with serum cr at 1,0 with K at 3,7 and serum bicarbonate at 29,  Na 139   Continue furosemide 40 mg daily per rube Check renal function in am.

## 2022-11-07 NOTE — Assessment & Plan Note (Addendum)
Echocardiogram with preserved LV systolic function with EF 60 to 65%, mild LVH, RV systolic function preserved, RVSP 35.5,   Likely edema also from hypoproteinemia., Negative fluid balance was achieved with furosemide IV and then transition to oral formulation with good toleration.  At the time his discharge will resume carvedilol and losartan.

## 2022-11-07 NOTE — Plan of Care (Signed)
  Problem: Education: Goal: Knowledge of General Education information will improve Description: Including pain rating scale, medication(s)/side effects and non-pharmacologic comfort measures Outcome: Not Progressing   Problem: Health Behavior/Discharge Planning: Goal: Ability to manage health-related needs will improve Outcome: Not Progressing   Problem: Clinical Measurements: Goal: Ability to maintain clinical measurements within normal limits will improve Outcome: Not Progressing Goal: Will remain free from infection Outcome: Not Progressing Goal: Diagnostic test results will improve Outcome: Not Progressing Goal: Respiratory complications will improve Outcome: Not Progressing Goal: Cardiovascular complication will be avoided Outcome: Not Progressing   Problem: Activity: Goal: Risk for activity intolerance will decrease Outcome: Not Progressing   Problem: Nutrition: Goal: Adequate nutrition will be maintained Outcome: Not Progressing   Problem: Coping: Goal: Level of anxiety will decrease Outcome: Not Progressing   Problem: Elimination: Goal: Will not experience complications related to bowel motility Outcome: Not Progressing Goal: Will not experience complications related to urinary retention Outcome: Not Progressing   Problem: Pain Managment: Goal: General experience of comfort will improve Outcome: Not Progressing   Problem: Safety: Goal: Ability to remain free from injury will improve Outcome: Not Progressing   Problem: Skin Integrity: Goal: Risk for impaired skin integrity will decrease Outcome: Not Progressing   Problem: Education: Goal: Ability to describe self-care measures that may prevent or decrease complications (Diabetes Survival Skills Education) will improve Outcome: Not Progressing   Problem: Coping: Goal: Ability to adjust to condition or change in health will improve Outcome: Not Progressing   Problem: Fluid Volume: Goal: Ability to  maintain a balanced intake and output will improve Outcome: Not Progressing   Problem: Metabolic: Goal: Ability to maintain appropriate glucose levels will improve Outcome: Not Progressing   Problem: Nutritional: Goal: Maintenance of adequate nutrition will improve Outcome: Not Progressing   Problem: Skin Integrity: Goal: Risk for impaired skin integrity will decrease Outcome: Not Progressing   Problem: Tissue Perfusion: Goal: Adequacy of tissue perfusion will improve Outcome: Not Progressing

## 2022-11-07 NOTE — Assessment & Plan Note (Signed)
Advanced dementia.  Continue supportive medical care.

## 2022-11-07 NOTE — Progress Notes (Signed)
Progress Note   Patient: Harry [name] ZOX:[id number] DOB: 1945/05/21 DOA: 10/30/2022     8 DOS: the patient was seen and examined on 11/07/2022   Brief hospital course: Mr. Aagard was admitted to the hospital with the working diagnosis of recurrent aspiration complicated with respiratory failure.   77 y.o. male with a known history of CVA, CKD, hypertension, hyperlipidemia, Lewy body dementia peripheral vascular disease, CVA and type 2 diabetes presents to the emergency department for evaluation of respiratory distress.  Patient was discharged from the hospital on 10/30/22 to [facility] after admission for anemia requiring transfusion, aspiration pneumonia and bilateral LE pressure ulcers.  He was at the facility for one hour and was found to be hypoxic into the 70s and in respiratory distress.  CPAP was started as well as a nebulizer.  Of note patient has indwelling foley and GTube.   Chest radiograph with bilateral interstitial infiltrates right upper lobe and left lower lobe.   8/29 patient was noted to be significantly hypoxic. Chest x-ray showed whiteout of the left lung. Patient likely experiencing mucous plugging because of aspiration. Pulmonology was consulted. They have had multiple discussions with patient's daughter. At this time supportive care has been provided. No plans for procedures. Patient's CODE STATUS was changed over to DNR/DNI by his daughter.   Assessment and Plan: * Acute on chronic diastolic CHF (congestive heart failure) (HCC) Volume status has improved.  Likely edema also from hypoproteinemia.,  Plan to continue blood pressure monitoring Continue to hold on diuretic therapy for now.   Aspiration into airway Aspiration pneumonia, present on admission.   Patient has been on antibiotic therapy, broad spectrum, vancomycin and zosyn. Currently only zosyn PEG tube was exchanged today  Plan to continue aspiration precautions.  Patient with recurrent aspiration and  poor prognosis, code status is DNR.   Chronic anemia Patient required one unit PRBC transfusion on 08/30 Continue close follow up on Hgb. No signs of active bleeding.   Essential hypertension Continue blood pressure monitoring.  Holding on antihypertensive medications.   Hemiplegia, late effect of cerebrovascular disease (HCC) Patient with poor mobility and poor communication Continue supportive medical care. Poor prognosis and high risk for medical complications.   Urinary retention, with chronic indwelling foley catheter.   Lewy body dementia (HCC) Advanced dementia.  Continue supportive medical care.   Type 2 diabetes mellitus with hyperlipidemia (HCC) Continue insulin sliding scal for glucose cover and monitoring Patient has uncontrolled T2DM with hypoglycemia.    Stage 3b chronic kidney disease (HCC) Renal function stable, with serum cr at 1,2 with K at 3,9 and serum bicarbonate at 28 Na 136.  Follow up renal function in am.        Subjective: Patient post gastric tube exchange, still sedated but able to follow commands.   Physical Exam: Vitals:   11/07/22 0753 11/07/22 0807 11/07/22 1101 11/07/22 1515  BP:  (!) 151/65 139/68 (!) 145/67  Pulse:  100 90 90  Resp: (!) 28 (!) 25 20 17   Temp:  (!) 97.3 F (36.3 C) (!) 97.5 F (36.4 C) 98.2 F (36.8 C)  TempSrc:  Oral Oral Oral  SpO2:  94% 95% 100%  Weight:      Height:       Neurology sedated. Follows simple commands, opens his eyes ENT pallor with no icterus Cardiovascular with S1 and S2 present and rhythmic with no gallops Respiratory with scattered rhonchi Abdomen with gastric tube in place Positive lower extremity edema  Data  Reviewed:    Family Communication: no family at the bedside   Disposition: Status is: Inpatient Remains inpatient appropriate because: medical therapy for pneumonia   Planned Discharge Destination: Skilled nursing facility     Author: Coralie Keens,  MD 11/07/2022 5:27 PM  For on call review [email or web address].

## 2022-11-08 ENCOUNTER — Inpatient Hospital Stay (HOSPITAL_COMMUNITY): Payer: Medicare Other

## 2022-11-08 DIAGNOSIS — I5033 Acute on chronic diastolic (congestive) heart failure: Secondary | ICD-10-CM | POA: Diagnosis not present

## 2022-11-08 DIAGNOSIS — D649 Anemia, unspecified: Secondary | ICD-10-CM | POA: Diagnosis not present

## 2022-11-08 DIAGNOSIS — I10 Essential (primary) hypertension: Secondary | ICD-10-CM | POA: Diagnosis not present

## 2022-11-08 DIAGNOSIS — T17908A Unspecified foreign body in respiratory tract, part unspecified causing other injury, initial encounter: Secondary | ICD-10-CM | POA: Diagnosis not present

## 2022-11-08 DIAGNOSIS — F0280 Dementia in other diseases classified elsewhere without behavioral disturbance: Secondary | ICD-10-CM

## 2022-11-08 LAB — CBC
HCT: 24.7 % — ABNORMAL LOW (ref 39.0–52.0)
Hemoglobin: 7.7 g/dL — ABNORMAL LOW (ref 13.0–17.0)
MCH: 26.5 pg (ref 26.0–34.0)
MCHC: 31.2 g/dL (ref 30.0–36.0)
MCV: 84.9 fL (ref 80.0–100.0)
Platelets: 358 10*3/uL (ref 150–400)
RBC: 2.91 MIL/uL — ABNORMAL LOW (ref 4.22–5.81)
RDW: 15.5 % (ref 11.5–15.5)
WBC: 5.2 10*3/uL (ref 4.0–10.5)
nRBC: 0 % (ref 0.0–0.2)

## 2022-11-08 LAB — BASIC METABOLIC PANEL
Anion gap: 6 (ref 5–15)
BUN: 17 mg/dL (ref 8–23)
CO2: 27 mmol/L (ref 22–32)
Calcium: 7.8 mg/dL — ABNORMAL LOW (ref 8.9–10.3)
Chloride: 105 mmol/L (ref 98–111)
Creatinine, Ser: 1.23 mg/dL (ref 0.61–1.24)
GFR, Estimated: >60 mL/min (ref >60)
Glucose, Bld: 98 mg/dL (ref 70–99)
Potassium: 3.9 mmol/L (ref 3.5–5.1)
Sodium: 138 mmol/L (ref 135–145)

## 2022-11-08 LAB — GLUCOSE, CAPILLARY
Glucose-Capillary: 102 mg/dL — ABNORMAL HIGH (ref 70–99)
Glucose-Capillary: 127 mg/dL — ABNORMAL HIGH (ref 70–99)
Glucose-Capillary: 144 mg/dL — ABNORMAL HIGH (ref 70–99)
Glucose-Capillary: 147 mg/dL — ABNORMAL HIGH (ref 70–99)
Glucose-Capillary: 161 mg/dL — ABNORMAL HIGH (ref 70–99)
Glucose-Capillary: 85 mg/dL (ref 70–99)

## 2022-11-08 MED ORDER — JEVITY 1.5 CAL/FIBER PO LIQD
1000.0000 mL | ORAL | Status: DC
  Administered 2022-11-08: 1000 mL
  Filled 2022-11-08 (×7): qty 1000

## 2022-11-08 MED ORDER — FUROSEMIDE 40 MG PO TABS
40.0000 mg | ORAL_TABLET | Freq: Every day | ORAL | Status: DC
  Administered 2022-11-08 – 2022-11-12 (×5): 40 mg
  Filled 2022-11-08 (×5): qty 1

## 2022-11-08 MED ORDER — FAMOTIDINE 40 MG/5ML PO SUSR
20.0000 mg | Freq: Every day | ORAL | Status: DC
  Administered 2022-11-08 – 2022-11-12 (×5): 20 mg
  Filled 2022-11-08 (×5): qty 2.5

## 2022-11-08 NOTE — Progress Notes (Signed)
Progress Note   Patient: Harry [name] NWG:[id number] DOB: [birth year] DOA: 10/30/2022     9 DOS: the patient was seen and examined on 11/08/2022   Brief hospital course: Harry [name] was admitted to the hospital with the working diagnosis of recurrent aspiration complicated with respiratory failure.   77 y.o. male with a known history of CVA, CKD, hypertension, hyperlipidemia, Lewy body dementia peripheral vascular disease, CVA and type 2 diabetes presents to the emergency department for evaluation of respiratory distress.  Patient was discharged from the hospital on 10/30/22 to [facility] after admission for anemia requiring transfusion, aspiration pneumonia and bilateral LE pressure ulcers.  He was at the facility for one hour and was found to be hypoxic into the 70s and in respiratory distress.  CPAP was started as well as a nebulizer.  Of note patient has indwelling foley and GTube.   Chest radiograph with bilateral interstitial infiltrates right upper lobe and left lower lobe.   8/29 patient was noted to be significantly hypoxic. Chest x-ray showed whiteout of the left lung. Patient likely experiencing mucous plugging because of aspiration. Pulmonology was consulted. They have had multiple discussions with Harry [name]. At this time supportive care has been provided. No plans for procedures. Harry CODE STATUS was changed over to DNR/DNI by his [name].   09/04 Gastric tube was exchanged per IR 09/05 patient with 02 desaturation and requiring high flow oxygen per non re-breather.  Chest radiograph with persistent bilateral interstitial infiltrates.   Assessment and Plan: * Acute on chronic diastolic CHF (congestive heart failure) (HCC) Echocardiogram with preserved LV systolic function with EF 60 to 65%, mild LVH, RV systolic function preserved, RVSP 35.5,   Likely edema also from hypoproteinemia.,  Will resume diuresis to keep negative fluid balance.   Aspiration into  airway Aspiration pneumonia, present on admission.   Patient has been on antibiotic therapy, broad spectrum, vancomycin and zosyn. He has completed his course of antibiotics with Zosyn.  Today looks like had a aspiration pneumonitis, with increased 02 requirements.   Plan to continue aspiration precautions, head elevated 45 degrees at all time.   Unfortunately his prognosis is poor due to recurrent aspiration episodes.  Acute hypoxemic respiratory failure, due to aspiration pneumonia, aspiration pneumonitis, left pleural effusion.  Today with increased 02 requirements, non re breather. Plan to keep 02 saturation 92% or greater.   Chronic anemia Patient required one unit PRBC transfusion on 08/30 Follow up hgb is 7,7. No signs of active bleeding.   Essential hypertension Continue blood pressure monitoring.  Holding on antihypertensive medications.   Hemiplegia, late effect of cerebrovascular disease (HCC) Patient with poor mobility and poor communication Continue supportive medical care. Poor prognosis and high risk for medical complications.   Urinary retention, with chronic indwelling foley catheter.   Lewy body dementia (HCC) Advanced dementia.  Continue supportive medical care.   Type 2 diabetes mellitus with hyperlipidemia (HCC) Continue insulin sliding scal for glucose cover and monitoring Patient has uncontrolled T2DM with hypoglycemia.    Stage 3b chronic kidney disease (HCC) Renal function today with serum cr at 1,23 with K at 3,9 and serum bicarbonate at 27, A 139  Follow up renal function in am.  Will resume furosemide 40 mg daily per rube.         Subjective: Patient poorly reactive today, his 02 saturation worsened and he requires increase 02 supply. No apparent pain or increased work of breathing.    Physical Exam: Vitals:   11/08/22  0500 11/08/22 0744 11/08/22 0823 11/08/22 1138  BP:  (!) 154/73 (!) 154/73 (!) 160/67  Pulse:  87 88 89  Resp:  20  20 19   Temp:  98.5 F (36.9 C)  99 F (37.2 C)  TempSrc:  Oral  Oral  SpO2:  97% 100% 99%  Weight: 80.6 kg     Height:       Neurology eyes closed and not interactive, deconditioned.  ENT with mild pallor Cardiovascular with S1 and S2 present and regular with no gallops or murmurs No JVD Positive trace lower extremity edema  Respiratory with rhonchi bilaterally with no wheezing or rhonchi Abdomen with no distention, feeding tube in place,  Data Reviewed:    Family Communication: I spoke with Harry [name] over the phone,  we talked in detail about Harry condition, plan of care and prognosis and all questions were addressed.   Disposition: Status is: Inpatient Remains inpatient appropriate because: respiratory failure with high 02 requirements.,   Planned Discharge Destination: Skilled nursing facility     Author: Coralie Keens, MD 11/08/2022 11:52 AM  For on call review [email or web address].

## 2022-11-08 NOTE — Progress Notes (Signed)
Nutrition Follow-up  DOCUMENTATION CODES:   Not applicable  INTERVENTION:  Tube feeding via GJ-tube: Advance Jevity 1.5 by 10ml q12h to a goal rate of 82ml/hr ( per day) 60ml ProSource tube feeding once daily Continue free water flushes q4h   Goal tube feeding regimen provides 2240 kcal, 112g protein, total free water faily (TF + FWF)  NUTRITION DIAGNOSIS:   Increased nutrient needs related to acute illness, wound healing as evidenced by estimated needs. - remains applicable  GOAL:   Patient will meet greater than or equal to 90% of their needs - goal unmet, addressing via TF advancement  MONITOR:   Labs, Weight trends, TF tolerance, Skin  REASON FOR ASSESSMENT:   Consult Assessment of nutrition requirement/status  ASSESSMENT:   Pt admitted with acute hypoxic respiratory failure 2/2 new onset CHF. PMH significant for CVA, CKD, HTN, HLD, lewy body dementia, PVD, CVA, T2DM. Pt recently discharged to [facility] following admission for anemia, aspiration PNA and bilateral LE pressure ulcers.  9/4 - s/p GJ tube conversion  Pt continues with decreased saturations, periods of apnea, and trouble with secretion clearance. S/p NTS this morning by RT obtaining copious amount of thick, tan secretions.   Spoke with RN. TF has remained infusing at low rate following GJ tube conversion. Discussed TF advancement with MD, agree to advancement. Attempted to call pt's daughter and update on plan. Unfortunately, no answer at this time. Will place slow TF titration orders and monitor closely for tolerance. Do not suspect aspiration of TF given placement of tube in jejunum. Unfortunately, it sounds like pt continues with difficulty managing secretions, likely leading to recurrent aspiration events.   Weight history: 8/29: 81.6 kg 9/5: 80.6 kg  Medications: colace, pepcid, fergon, lasix 40mg  daily, SSI 0-9 units q4h, MVI,   Labs:  CBG's 85-164 x24 hours  UOP: 1.4L x12  hours  Diet Order:   Diet Order             Diet NPO time specified  Diet effective now                   EDUCATION NEEDS:   No education needs have been identified at this time  Skin:  Skin Integrity Issues:: DTI, Unstageable DTI: R heel Unstageable: Mid sacrum  Last BM:  9/5 (type 6 x2- large)  Height:   Ht Readings from Last 1 Encounters:  10/30/22 5\' 9"  (1.753 m)    Weight:   Wt Readings from Last 1 Encounters:  11/08/22 80.6 kg   BMI:  Body mass index is 26.24 kg/m.  Estimated Nutritional Needs:   Kcal:  2100-2300  Protein:  105-115g  Fluid:  >/=2L  Drusilla Kanner, RDN, LDN Clinical Nutrition

## 2022-11-08 NOTE — Plan of Care (Signed)
?  Problem: Clinical Measurements: ?Goal: Will remain free from infection ?Outcome: Progressing ?  ?

## 2022-11-08 NOTE — Progress Notes (Signed)
Notified Dr. Ella Jubilee that patient is intermittently desatting into the 70s with brief periods of apnea. RT present with patient. RT deep suctioned and imitated NRB. Order received for chest x-ray. Dr. Ella Jubilee to call patient's daughter after receiving chest x-ray results.

## 2022-11-08 NOTE — Progress Notes (Signed)
RT called to patient room due to patient having a decrease in sats to the 70s (with a  good waveform), periods of apnea, and trouble with secretion clearance.  Upon arrival, patient was on 4L [place] with sats of 76-84%.  NTS patient and was able to obtain a copious amount of thick, tan secretions.  Placed patient on NRB after and sats are now 100%.  AM nebulizer treatment given.  MD paged.  Will continue to monitor and wait for further orders.

## 2022-11-08 NOTE — Plan of Care (Signed)
  Problem: Clinical Measurements: Goal: Ability to maintain clinical measurements within normal limits will improve Outcome: Progressing   Problem: Elimination: Goal: Will not experience complications related to bowel motility Outcome: Progressing   Problem: Nutritional: Goal: Maintenance of adequate nutrition will improve Outcome: Progressing

## 2022-11-09 DIAGNOSIS — J690 Pneumonitis due to inhalation of food and vomit: Secondary | ICD-10-CM | POA: Diagnosis not present

## 2022-11-09 DIAGNOSIS — T17908A Unspecified foreign body in respiratory tract, part unspecified causing other injury, initial encounter: Secondary | ICD-10-CM | POA: Diagnosis not present

## 2022-11-09 DIAGNOSIS — I10 Essential (primary) hypertension: Secondary | ICD-10-CM | POA: Diagnosis not present

## 2022-11-09 DIAGNOSIS — I509 Heart failure, unspecified: Secondary | ICD-10-CM | POA: Diagnosis not present

## 2022-11-09 DIAGNOSIS — D649 Anemia, unspecified: Secondary | ICD-10-CM | POA: Diagnosis not present

## 2022-11-09 DIAGNOSIS — I5033 Acute on chronic diastolic (congestive) heart failure: Secondary | ICD-10-CM | POA: Diagnosis not present

## 2022-11-09 DIAGNOSIS — J9601 Acute respiratory failure with hypoxia: Secondary | ICD-10-CM | POA: Diagnosis not present

## 2022-11-09 LAB — BASIC METABOLIC PANEL
Anion gap: 6 (ref 5–15)
BUN: 15 mg/dL (ref 8–23)
CO2: 29 mmol/L (ref 22–32)
Calcium: 7.7 mg/dL — ABNORMAL LOW (ref 8.9–10.3)
Chloride: 104 mmol/L (ref 98–111)
Creatinine, Ser: 1.04 mg/dL (ref 0.61–1.24)
GFR, Estimated: >60 mL/min (ref >60)
Glucose, Bld: 181 mg/dL — ABNORMAL HIGH (ref 70–99)
Potassium: 3.7 mmol/L (ref 3.5–5.1)
Sodium: 139 mmol/L (ref 135–145)

## 2022-11-09 LAB — GLUCOSE, CAPILLARY
Glucose-Capillary: 114 mg/dL — ABNORMAL HIGH (ref 70–99)
Glucose-Capillary: 161 mg/dL — ABNORMAL HIGH (ref 70–99)
Glucose-Capillary: 162 mg/dL — ABNORMAL HIGH (ref 70–99)
Glucose-Capillary: 162 mg/dL — ABNORMAL HIGH (ref 70–99)
Glucose-Capillary: 163 mg/dL — ABNORMAL HIGH (ref 70–99)
Glucose-Capillary: 171 mg/dL — ABNORMAL HIGH (ref 70–99)
Glucose-Capillary: 192 mg/dL — ABNORMAL HIGH (ref 70–99)

## 2022-11-09 NOTE — Progress Notes (Signed)
Progress Note   Patient: Harry [name]:[id number] DOB: 05-23-1945 DOA: 10/30/2022     10 DOS: the patient was seen and examined on 11/09/2022   Brief hospital course: Harry [name] was admitted to the hospital with the working diagnosis of recurrent aspiration complicated with respiratory failure.   77 y.o. male with a known history of CVA, CKD, hypertension, hyperlipidemia, Lewy body dementia peripheral vascular disease, CVA and type 2 diabetes presents to the emergency department for evaluation of respiratory distress.  Patient was discharged from the hospital on 10/30/22 to [facility] after admission for anemia requiring transfusion, aspiration pneumonia and bilateral LE pressure ulcers.  He was at the facility for one hour and was found to be hypoxic into the 70s and in respiratory distress.  CPAP was started as well as a nebulizer.  Of note patient has indwelling foley and GTube.   Chest radiograph with bilateral interstitial infiltrates right upper lobe and left lower lobe.   8/29 patient was noted to be significantly hypoxic. Chest x-ray showed whiteout of the left lung. Patient likely experiencing mucous plugging because of aspiration. Pulmonology was consulted. They have had multiple discussions with patient's daughter. At this time supportive care has been provided. No plans for procedures. Patient's CODE STATUS was changed over to DNR/DNI by his daughter.   09/04 Gastric tube was exchanged per IR 09/05 patient with 02 desaturation and requiring high flow oxygen per non re-breather.  Chest radiograph with persistent bilateral interstitial infiltrates.  09/06 more awake and alert. Improved 02 requirements to nasal cannula.   Assessment and Plan: * Acute on chronic diastolic CHF (congestive heart failure) (HCC) Echocardiogram with preserved LV systolic function with EF 60 to 65%, mild LVH, RV systolic function preserved, RVSP 35.5,   Likely edema also from  hypoproteinemia.,  Continue diuresis with furosemide 40 mg daily.  Continue blood pressure monitoring.   Aspiration into airway Aspiration pneumonia, present on admission.   Patient has been on antibiotic therapy, broad spectrum, vancomycin and zosyn. He has completed his course of antibiotics with Zosyn.  09/05 episode of aspiration pneumonitis with increased 02 requirements, up to non re-breather mask. Today is back on nasal cannula 3 L with 02 saturation 99%.   Plan to continue aspiration precautions, head elevated 45 degrees at all time.   Unfortunately his prognosis is poor due to recurrent aspiration episodes.  Acute hypoxemic respiratory failure, due to aspiration pneumonia, aspiration pneumonitis, left pleural effusion.  Improved oxygenation to 100 to 99% on 3 L per nasal cannula.   Chronic anemia Patient required one unit PRBC transfusion on 08/30 Follow up hgb is 7,7. No signs of active bleeding.  Follow up H&H in am.   Essential hypertension Continue blood pressure monitoring.  Holding on antihypertensive medications.   Hemiplegia, late effect of cerebrovascular disease (HCC) Patient with poor mobility and poor communication Continue supportive medical care. Poor prognosis and high risk for medical complications.   Urinary retention, with chronic indwelling foley catheter.   Lewy body dementia (HCC) Advanced dementia.  Continue supportive medical care.   Type 2 diabetes mellitus with hyperlipidemia (HCC) Continue insulin sliding scal for glucose cover and monitoring.  His glucose has been stable, hypoglycemia has resolved.  Tolerating well tube feedings.    Stage 3b chronic kidney disease (HCC) Volume status has improved.  Renal function today with serum cr at 1,0 with K at 3,7 and serum bicarbonate at 29,  Na 139   Continue furosemide 40 mg daily per rube  Check renal function in am.         Subjective: Patient is more awake today, more  interactive, no dyspnea and tolerating tube feedings well.   Physical Exam: Vitals:   11/09/22 0410 11/09/22 0413 11/09/22 0725 11/09/22 1036  BP: (!) 157/70  (!) 152/68 (!) 156/69  Pulse: 87  89 84  Resp: (!) 23  18 17   Temp: 98.4 F (36.9 C)  98.7 F (37.1 C) 98.6 F (37 C)  TempSrc: Axillary  Oral Oral  SpO2: 92%  99% 100%  Weight:  80.2 kg    Height:       Neurology awake and alert, responds to simple commands and responds to simple questions ENT with mild pallor Cardiovascular with S1 and S2 present and regular Respiratory with scattered rhonchi with no wheezing Abdomen with no distention and non tender, peg tube for feedings Trace lower extremity edema  Data Reviewed:    Family Communication: no family at the bedside   Disposition: Status is: Inpatient Remains inpatient appropriate because: recovering respiratory failure.   Planned Discharge Destination: Skilled nursing facility     Author: Coralie Keens, MD 11/09/2022 11:49 AM  For on call review [email or web address].

## 2022-11-09 NOTE — Plan of Care (Signed)
Pt continues to maintain homeostasis on current medical regimen

## 2022-11-09 NOTE — Progress Notes (Signed)
Daily Progress Note   Patient Name: Harry [name]       Date: 11/09/2022 DOB: Oct 10, 1945  Age: 77 y.o. MRN#: [id number] Attending Physician: Coralie Keens Primary Care Physician: Crist Fat, MD Admit Date: 10/30/2022  Reason for Consultation/Follow-up: Establishing goals of care  Subjective: Medical records reviewed including progress notes, labs, and imaging. Patient assessed at the bedside. He is resting comfortably on 3 L nasal cannula. No family present during my visit.   I called patient's daughter Harry [name] to provide ongoing palliative support. We discussed appropriateness of outpatient palliative care follow up for ongoing discussions and support at LTC and she is interested in a referral. She wonders if this would help him have access to respiratory therapies/additional strategies for management of secretions. We discussed limited options for oxygen and management of secretions in the SNF setting. [name]'s biggest concern is patient potentially "drowning in his own secretions." We discussed that there are some options such as [facility] with more of a comfort-focused care approach, though I would be more hesitant to use with current emphasis on life-prolonging care.    Discussed the differences in support with palliative vs hospice care at LTC facility. Provided education on hospice facility eligibility criteria as well. Clarified that a hospice facility does not typically provide artificial feeding for patients with prognosis of two weeks or less (evidence-based research has shown this increases risk of harm/discomfort rather than any benefit at end-of-life). I also shared that patient would likely meet eligibility criteria if artificial nutrition was discontinued. She is  appreciative of the information, as she has been thinking about this a lot over the past few days.   Questions and concerns addressed. PMT will continue to support holistically.   Length of Stay: 10   Physical Exam Vitals and nursing note reviewed.  Constitutional:      General: He is not in acute distress.    Appearance: He is ill-appearing.  Cardiovascular:     Rate and Rhythm: Normal rate.  Pulmonary:     Effort: Pulmonary effort is normal.  Psychiatric:        Cognition and Memory: Cognition is impaired.            Vital Signs: BP (!) 152/68 (BP Location: Left Arm)   Pulse 89   Temp 98.7 F (37.1  C) (Oral)   Resp 18   Ht 5\' 9"  (1.753 m)   Wt 80.2 kg   SpO2 99%   BMI 26.11 kg/m  SpO2: SpO2: 99 % O2 Device: O2 Device: Nasal Cannula O2 Flow Rate: O2 Flow Rate (L/min): 3 L/min      Palliative Assessment/Data: 30% (on tube feeds)   Palliative Care Assessment & Plan   Patient Profile: 77 y.o. male  with past medical history of Lewy body dementia, CVA, CKD, dysphagia with PEG, HTN, chronic foley, PVD, and DM  admitted on 10/30/2022 with respiratory distress.    Patient was recently discharged on 8/27 to [facility] after hospitalization for acute anemia, aspiration pneumonia, bilateral pressure ulcers.  He returned for evaluation after 1 hour at the SNF due to hypoxia and respiratory symptoms.  He is now admitted for acute hypoxic respiratory failure secondary to new onset CHF.  Initial CXR was concerning for volume overload and heart failure though unfortunately, CXR on 8/29 showed complete whiteout of the left lung and concern for mucous plugging.  Patient would be high risk for prolonged mechanical ventilation after bronchoscopy.   Patient is known to PMT from prior hospitalizations.  He has been hospitalized 5 times in the past 6 months.  PMT has been consulted to assist with goals of care conversation with daughter's consent.  Assessment: Goals of care  conversation Acute respiratory failure with hypoxemia Recurrent aspiration pneumonia Acute diastolic CHF  Recommendations/Plan: Continue DNR/DNI Continue full scope treatment Goals of care are for return to SNF with outpatient palliative care. TOC consulted for assistance with referral. Patient's daughter continues to reflect on hospice as well Psychosocial and emotional support provided PMT remains available as needed   Prognosis: Poor with high risk for further decline  Discharge Planning: Skilled Nursing Facility for rehab with Palliative care service follow-up  Care plan was discussed with patient's daughter   MDM: High         Lauralei Clouse Jeni Salles, PA-C  Palliative Medicine Team Team phone # [phone]  Thank you for allowing the Palliative Medicine Team to assist in the care of this patient. Please utilize secure chat with additional questions, if there is no response within 30 minutes please call the above phone number.  Palliative Medicine Team providers are available by phone from 7am to 7pm daily and can be reached through the team cell phone.  Should this patient require assistance outside of these hours, please call the patient's attending physician.

## 2022-11-10 DIAGNOSIS — I10 Essential (primary) hypertension: Secondary | ICD-10-CM | POA: Diagnosis not present

## 2022-11-10 DIAGNOSIS — D649 Anemia, unspecified: Secondary | ICD-10-CM | POA: Diagnosis not present

## 2022-11-10 DIAGNOSIS — T17908A Unspecified foreign body in respiratory tract, part unspecified causing other injury, initial encounter: Secondary | ICD-10-CM | POA: Diagnosis not present

## 2022-11-10 DIAGNOSIS — I5033 Acute on chronic diastolic (congestive) heart failure: Secondary | ICD-10-CM | POA: Diagnosis not present

## 2022-11-10 LAB — LIPID PANEL
Cholesterol: 68 mg/dL (ref 0–200)
HDL: 19 mg/dL — ABNORMAL LOW (ref >40)
LDL Cholesterol: 33 mg/dL (ref 0–99)
Total CHOL/HDL Ratio: 3.6 ratio
Triglycerides: 80 mg/dL (ref <150)
VLDL: 16 mg/dL (ref 0–40)

## 2022-11-10 LAB — HEMOGLOBIN AND HEMATOCRIT, BLOOD
HCT: 24.4 % — ABNORMAL LOW (ref 39.0–52.0)
Hemoglobin: 7.5 g/dL — ABNORMAL LOW (ref 13.0–17.0)

## 2022-11-10 LAB — GLUCOSE, CAPILLARY
Glucose-Capillary: 149 mg/dL — ABNORMAL HIGH (ref 70–99)
Glucose-Capillary: 160 mg/dL — ABNORMAL HIGH (ref 70–99)
Glucose-Capillary: 163 mg/dL — ABNORMAL HIGH (ref 70–99)
Glucose-Capillary: 161 mg/dL — ABNORMAL HIGH (ref 70–99)

## 2022-11-10 MED ORDER — POTASSIUM CHLORIDE 20 MEQ PO PACK
20.0000 meq | PACK | Freq: Every day | ORAL | Status: DC
  Administered 2022-11-10 – 2022-11-12 (×3): 20 meq
  Filled 2022-11-10 (×3): qty 1

## 2022-11-10 NOTE — Progress Notes (Signed)
Progress Note   Patient: Harry [name]:[id number] DOB: [birth year] DOA: 10/30/2022     11 DOS: the patient was seen and examined on 11/10/2022   Brief hospital course: Harry [name] was admitted to the hospital with the working diagnosis of recurrent aspiration complicated with respiratory failure.   77 y.o. male with a known history of CVA, CKD, hypertension, hyperlipidemia, Lewy body dementia peripheral vascular disease, CVA and type 2 diabetes presents to the emergency department for evaluation of respiratory distress.  Patient was discharged from the hospital on 10/30/22 to [facility] after admission for anemia requiring transfusion, aspiration pneumonia and bilateral LE pressure ulcers.  He was at the facility for one hour and was found to be hypoxic into the 70s and in respiratory distress.  CPAP was started as well as a nebulizer.  Of note patient has indwelling foley and GTube.   Chest radiograph with bilateral interstitial infiltrates right upper lobe and left lower lobe.   8/29 patient was noted to be significantly hypoxic. Chest x-ray showed whiteout of the left lung. Patient likely experiencing mucous plugging because of aspiration. Pulmonology was consulted. They have had multiple discussions with patient's daughter. At this time supportive care has been provided. No plans for procedures. Patient's CODE STATUS was changed over to DNR/DNI by his daughter.   09/04 Gastric tube was exchanged per IR 09/05 patient with 02 desaturation and requiring high flow oxygen per non re-breather.  Chest radiograph with persistent bilateral interstitial infiltrates.  09/06 more awake and alert. Improved 02 requirements to nasal cannula.  09/07 Stable oxygenation. Plan for possible transfer back to SNF on 11/12/22.   Assessment and Plan: * Acute on chronic diastolic CHF (congestive heart failure) (HCC) Echocardiogram with preserved LV systolic function with EF 60 to 65%, mild LVH, RV systolic  function preserved, RVSP 35.5,   Likely edema also from hypoproteinemia.,  Continue diuresis with furosemide 40 mg daily.  Continue blood pressure monitoring.   Aspiration into airway Aspiration pneumonia, present on admission.   Patient has been on antibiotic therapy, broad spectrum, vancomycin and zosyn. He has completed his course of antibiotics with Zosyn.  09/05 episode of aspiration pneumonitis with increased 02 requirements, up to non re-breather mask. Oxygenation has improved, has been back on nasal cannula 3l/min with 02 saturation 100%   Plan to continue aspiration precautions, head elevated 45 degrees at all times.   Unfortunately his prognosis is poor due to recurrent aspiration episodes.  Acute hypoxemic respiratory failure, due to aspiration pneumonia, aspiration pneumonitis, left pleural effusion.  Oxygenation has improved, and 02 requirements have decreased.   Chronic anemia Patient required one unit PRBC transfusion on 08/30 Follow up hgb is 7,5 No signs of active bleeding.  Check iron panel in am.   Essential hypertension Continue blood pressure monitoring.  Holding on antihypertensive medications.   Hemiplegia, late effect of cerebrovascular disease (HCC) Patient with poor mobility and poor communication Continue supportive medical care. Poor prognosis and high risk for medical complications.   Urinary retention, with chronic indwelling foley catheter.   Lewy body dementia (HCC) Advanced dementia.  Continue supportive medical care.   Type 2 diabetes mellitus with hyperlipidemia (HCC) Continue insulin sliding scal for glucose cover and monitoring.  His glucose has been stable, hypoglycemia has resolved.  Tolerating well tube feedings.    Stage 3b chronic kidney disease (HCC) Volume status has improved.   Continue furosemide 40 mg daily per rube Check renal function in am.    Subjective: Patient  comfortable on supplemental 02 per Harry [name], no apparent  chest pain or dyspnea   Physical Exam: Vitals:   11/09/22 2350 11/10/22 0441 11/10/22 0808 11/10/22 1132  BP: (!) 140/74 (!) 153/77 (!) 160/71 (!) 165/74  Pulse: 92 87 85 89  Resp: 16 17 19 16   Temp: 99.4 F (37.4 C) 98.6 F (37 C) 98.1 F (36.7 C) (!) 97.2 F (36.2 C)  TempSrc: Oral Oral Oral Oral  SpO2: 99% 97% 100% 100%  Weight:  78.5 kg    Height:       Neurology awake and alert, responds simple questions, deconditioned ENT with mild pallor Cardiovascular with S1 and S2 present and regular with no gallops Respiratory with no rales or wheezing, decreased breath sounds at bases Abdomen with no distention  Trace lower extremity edema  Data Reviewed:    Family Communication: no family at the bedside. I spoke with patient's daughter over the phone, we talked in detail about patient's condition, plan of care and prognosis and all questions were addressed.   Disposition: Status is: Inpatient Remains inpatient appropriate because: pending transfer to SNF  Planned Discharge Destination: Skilled nursing facility     Author: Coralie Keens, MD 11/10/2022 2:27 PM  For on call review [email or web address].

## 2022-11-10 NOTE — Plan of Care (Signed)
  Problem: Education: Goal: Knowledge of General Education information will improve Description: Including pain rating scale, medication(s)/side effects and non-pharmacologic comfort measures 11/10/2022 1521 by Zada Finders, RN Outcome: Not Progressing 11/10/2022 1521 by Zada Finders, RN Outcome: Progressing   Problem: Health Behavior/Discharge Planning: Goal: Ability to manage health-related needs will improve 11/10/2022 1521 by Zada Finders, RN Outcome: Not Progressing 11/10/2022 1521 by Lorrin Jackson D, RN Outcome: Progressing   Problem: Clinical Measurements: Goal: Ability to maintain clinical measurements within normal limits will improve 11/10/2022 1521 by Zada Finders, RN Outcome: Not Progressing 11/10/2022 1521 by Zada Finders, RN Outcome: Progressing

## 2022-11-11 DIAGNOSIS — D649 Anemia, unspecified: Secondary | ICD-10-CM | POA: Diagnosis not present

## 2022-11-11 DIAGNOSIS — T17908A Unspecified foreign body in respiratory tract, part unspecified causing other injury, initial encounter: Secondary | ICD-10-CM | POA: Diagnosis not present

## 2022-11-11 DIAGNOSIS — I10 Essential (primary) hypertension: Secondary | ICD-10-CM | POA: Diagnosis not present

## 2022-11-11 DIAGNOSIS — I5033 Acute on chronic diastolic (congestive) heart failure: Secondary | ICD-10-CM | POA: Diagnosis not present

## 2022-11-11 LAB — IRON AND TIBC
Iron: 16 ug/dL — ABNORMAL LOW (ref 45–182)
Saturation Ratios: 11 % — ABNORMAL LOW (ref 17.9–39.5)
TIBC: 153 ug/dL — ABNORMAL LOW (ref 250–450)
UIBC: 137 ug/dL

## 2022-11-11 LAB — GLUCOSE, CAPILLARY
Glucose-Capillary: 145 mg/dL — ABNORMAL HIGH (ref 70–99)
Glucose-Capillary: 169 mg/dL — ABNORMAL HIGH (ref 70–99)
Glucose-Capillary: 129 mg/dL — ABNORMAL HIGH (ref 70–99)
Glucose-Capillary: 139 mg/dL — ABNORMAL HIGH (ref 70–99)
Glucose-Capillary: 121 mg/dL — ABNORMAL HIGH (ref 70–99)
Glucose-Capillary: 213 mg/dL — ABNORMAL HIGH (ref 70–99)

## 2022-11-11 LAB — BASIC METABOLIC PANEL
Anion gap: 11 (ref 5–15)
BUN: 20 mg/dL (ref 8–23)
CO2: 28 mmol/L (ref 22–32)
Calcium: 7.9 mg/dL — ABNORMAL LOW (ref 8.9–10.3)
Chloride: 102 mmol/L (ref 98–111)
Creatinine, Ser: 0.97 mg/dL (ref 0.61–1.24)
GFR, Estimated: >60 mL/min (ref >60)
Glucose, Bld: 184 mg/dL — ABNORMAL HIGH (ref 70–99)
Potassium: 3.9 mmol/L (ref 3.5–5.1)
Sodium: 141 mmol/L (ref 135–145)

## 2022-11-11 LAB — FERRITIN: Ferritin: 195 ng/mL (ref 24–336)

## 2022-11-11 LAB — TRANSFERRIN: Transferrin: 105 mg/dL — ABNORMAL LOW (ref 180–329)

## 2022-11-11 MED ORDER — ACETAMINOPHEN 325 MG PO TABS
650.0000 mg | ORAL_TABLET | ORAL | Status: DC | PRN
  Administered 2022-11-12: 650 mg
  Filled 2022-11-11: qty 2

## 2022-11-11 MED ORDER — SODIUM CHLORIDE 0.9 % IV SOLN
250.0000 mg | Freq: Every day | INTRAVENOUS | Status: DC
  Administered 2022-11-11: 250 mg via INTRAVENOUS
  Filled 2022-11-11 (×2): qty 20

## 2022-11-11 NOTE — Progress Notes (Signed)
Progress Note   Patient: Harry [name]:[id number] DOB: 1945-05-29 DOA: 10/30/2022     12 DOS: the patient was seen and examined on 11/11/2022   Brief hospital course: Mr. Mustard was admitted to the hospital with the working diagnosis of recurrent aspiration complicated with respiratory failure.   77 y.o. male with a known history of CVA, CKD, hypertension, hyperlipidemia, Lewy body dementia peripheral vascular disease, CVA and type 2 diabetes presents to the emergency department for evaluation of respiratory distress.  Patient was discharged from the hospital on 10/30/22 to [facility] after admission for anemia requiring transfusion, aspiration pneumonia and bilateral LE pressure ulcers.  He was at the facility for one hour and was found to be hypoxic into the 70s and in respiratory distress.  CPAP was started as well as a nebulizer.  Of note patient has indwelling foley and GTube.   Chest radiograph with bilateral interstitial infiltrates right upper lobe and left lower lobe.   8/29 patient was noted to be significantly hypoxic. Chest x-ray showed whiteout of the left lung. Patient likely experiencing mucous plugging because of aspiration. Pulmonology was consulted. They have had multiple discussions with patient's daughter. At this time supportive care has been provided. No plans for procedures. Patient's CODE STATUS was changed over to DNR/DNI by his daughter.   09/04 Gastric tube was exchanged per IR 09/05 patient with 02 desaturation and requiring high flow oxygen per non re-breather.  Chest radiograph with persistent bilateral interstitial infiltrates.  09/06 more awake and alert. Improved 02 requirements to nasal cannula.  09/07 Stable oxygenation. Plan for possible transfer back to SNF on 11/12/22.  09/08 continue to improved, today he is on room air with good toleration. Plan to transfer to SNF tomorrow.   Assessment and Plan: * Acute on chronic diastolic CHF (congestive heart  failure) (HCC) Echocardiogram with preserved LV systolic function with EF 60 to 65%, mild LVH, RV systolic function preserved, RVSP 35.5,   Likely edema also from hypoproteinemia.,  Continue diuresis with furosemide 40 mg daily.  Continue blood pressure monitoring.   Aspiration into airway Aspiration pneumonia, present on admission.   Patient has been on antibiotic therapy, broad spectrum, vancomycin and zosyn. He has completed his course of antibiotics with Zosyn.  09/05 episode of aspiration pneumonitis with increased 02 requirements, up to non re-breather mask. Today patient is off supplemental 02, on room air -02 saturation is 95%.   Plan to continue aspiration precautions, head elevated 30 degrees at all times.   Unfortunately his prognosis is poor due to recurrent aspiration episodes.  Acute hypoxemic respiratory failure, due to aspiration pneumonia, aspiration pneumonitis, left pleural effusion.  Oxygenation has improved, and 02 requirements have decreased.  Today he is on room air.   Chronic anemia Patient required one unit PRBC transfusion on 08/30 Follow up hgb is 7,5 No signs of active bleeding.   Iron panel consistent with iron deficiency anemia combined with anemia of chronic disease. Serum iron 16, TIBC 153, ferritin 195, transferrin 105 and transferrin saturation 11.   Plan for IV iron infusion, 2 doses of ferric gluconate.  Follow up H&H in am, keep hgb above 7.   Essential hypertension Systolic blood pressure 160 to 180 mmHg.  Resume amlodipine at 10 mg daily.    Hemiplegia, late effect of cerebrovascular disease (HCC) Patient with poor mobility and poor communication Continue supportive medical care. Poor prognosis and high risk for medical complications.   Urinary retention, with chronic indwelling foley catheter.  Lewy body dementia (HCC) Advanced dementia.  Continue supportive medical care.   Type 2 diabetes mellitus with hyperlipidemia  (HCC) Continue insulin sliding scal for glucose cover and monitoring.  His glucose has been stable, hypoglycemia has resolved.  Tolerating well tube feedings.    Stage 3b chronic kidney disease (HCC) Volume status has improved.   Renal function stable with serum cr at 0.97 with K at 3,9 and serum bicarbonate at 28. Na 141.  Plan to continue furosemide, Kcl supplementation and free water flushes per feeding tube.    Subjective: Patient is calm, tolerating tube feedings, he now is on room air.   Physical Exam: Vitals:   11/11/22 0400 11/11/22 0725 11/11/22 0759 11/11/22 1135  BP: (!) 157/64 (!) 184/89  (!) 161/72  Pulse: 91 97  84  Resp: 16 (!) 21  (!) 24  Temp: 98.1 F (36.7 C) 97.9 F (36.6 C)  97.8 F (36.6 C)  TempSrc: Oral Oral  Oral  SpO2: 97% 91% 95% 95%  Weight: 78.3 kg     Height:       Neurology awake and alert, responds to simple questions ENT with mild pallor with no icterus Cardiovascular with S1 and S2 present and regular with no gallops, rubs or murmurs No JVD No lower extremity edema Respiratory with bilateral scattered rhonchi with no wheezing or rales  Abdomen with peg tube in place  Data Reviewed:    Family Communication: no family at the bedside   Disposition: Status is: Inpatient Remains inpatient appropriate because: plan to transfer to SNF tomorrow   Planned Discharge Destination: Skilled nursing facility    Author: Coralie Keens, MD 11/11/2022 12:55 PM  For on call review [email or web address].

## 2022-11-11 NOTE — Plan of Care (Signed)

## 2022-11-11 NOTE — Plan of Care (Signed)
  Problem: Clinical Measurements: Goal: Ability to maintain clinical measurements within normal limits will improve Outcome: Progressing Goal: Will remain free from infection Outcome: Progressing Goal: Respiratory complications will improve Outcome: Progressing Goal: Cardiovascular complication will be avoided Outcome: Progressing   Problem: Nutrition: Goal: Adequate nutrition will be maintained Outcome: Progressing   Problem: Coping: Goal: Level of anxiety will decrease Outcome: Progressing   Problem: Elimination: Goal: Will not experience complications related to bowel motility Outcome: Progressing Goal: Will not experience complications related to urinary retention Outcome: Progressing   Problem: Pain Managment: Goal: General experience of comfort will improve Outcome: Progressing   Problem: Safety: Goal: Ability to remain free from injury will improve Outcome: Progressing   Problem: Education: Goal: Knowledge of General Education information will improve Description: Including pain rating scale, medication(s)/side effects and non-pharmacologic comfort measures Outcome: Not Progressing   Problem: Health Behavior/Discharge Planning: Goal: Ability to manage health-related needs will improve Outcome: Not Progressing   Problem: Clinical Measurements: Goal: Diagnostic test results will improve Outcome: Not Progressing   Problem: Activity: Goal: Risk for activity intolerance will decrease Outcome: Not Progressing   Problem: Skin Integrity: Goal: Risk for impaired skin integrity will decrease Outcome: Not Progressing

## 2022-11-12 ENCOUNTER — Other Ambulatory Visit (HOSPITAL_COMMUNITY): Payer: Self-pay

## 2022-11-12 DIAGNOSIS — D649 Anemia, unspecified: Secondary | ICD-10-CM | POA: Diagnosis not present

## 2022-11-12 DIAGNOSIS — T17908A Unspecified foreign body in respiratory tract, part unspecified causing other injury, initial encounter: Secondary | ICD-10-CM | POA: Diagnosis not present

## 2022-11-12 DIAGNOSIS — I10 Essential (primary) hypertension: Secondary | ICD-10-CM | POA: Diagnosis not present

## 2022-11-12 DIAGNOSIS — I5033 Acute on chronic diastolic (congestive) heart failure: Secondary | ICD-10-CM | POA: Diagnosis not present

## 2022-11-12 LAB — GLUCOSE, CAPILLARY
Glucose-Capillary: 155 mg/dL — ABNORMAL HIGH (ref 70–99)
Glucose-Capillary: 162 mg/dL — ABNORMAL HIGH (ref 70–99)
Glucose-Capillary: 164 mg/dL — ABNORMAL HIGH (ref 70–99)
Glucose-Capillary: 173 mg/dL — ABNORMAL HIGH (ref 70–99)
Glucose-Capillary: 256 mg/dL — ABNORMAL HIGH (ref 70–99)

## 2022-11-12 LAB — HEMOGLOBIN AND HEMATOCRIT, BLOOD
HCT: 26 % — ABNORMAL LOW (ref 39.0–52.0)
Hemoglobin: 8 g/dL — ABNORMAL LOW (ref 13.0–17.0)

## 2022-11-12 MED ORDER — PROSOURCE TF20 ENFIT COMPATIBL EN LIQD
60.0000 mL | Freq: Every day | ENTERAL | 0 refills | Status: AC
  Filled 2022-11-12: qty 1800, 30d supply, fill #0

## 2022-11-12 MED ORDER — FAMOTIDINE 40 MG/5ML PO SUSR
20.0000 mg | Freq: Every day | ORAL | 0 refills | Status: AC
Start: 2022-11-12 — End: ?
  Filled 2022-11-12: qty 50, 20d supply, fill #0

## 2022-11-12 MED ORDER — POTASSIUM CHLORIDE 20 MEQ PO PACK
20.0000 meq | PACK | Freq: Every day | ORAL | 0 refills | Status: AC
  Filled 2022-11-12: qty 30, 30d supply, fill #0

## 2022-11-12 MED ORDER — FUROSEMIDE 40 MG PO TABS
40.0000 mg | ORAL_TABLET | Freq: Every day | ORAL | 0 refills | Status: AC
  Filled 2022-11-12: qty 30, 30d supply, fill #0

## 2022-11-12 NOTE — Plan of Care (Signed)
  Problem: Clinical Measurements: Goal: Ability to maintain clinical measurements within normal limits will improve Outcome: Progressing Goal: Will remain free from infection Outcome: Progressing Goal: Diagnostic test results will improve Outcome: Progressing Goal: Cardiovascular complication will be avoided Outcome: Progressing   Problem: Nutrition: Goal: Adequate nutrition will be maintained Outcome: Progressing   Problem: Coping: Goal: Level of anxiety will decrease Outcome: Progressing   Problem: Elimination: Goal: Will not experience complications related to urinary retention Outcome: Progressing   Problem: Pain Managment: Goal: General experience of comfort will improve Outcome: Progressing   Problem: Safety: Goal: Ability to remain free from injury will improve Outcome: Progressing   Problem: Education: Goal: Knowledge of General Education information will improve Description: Including pain rating scale, medication(s)/side effects and non-pharmacologic comfort measures Outcome: Not Progressing   Problem: Health Behavior/Discharge Planning: Goal: Ability to manage health-related needs will improve Outcome: Not Progressing   Problem: Clinical Measurements: Goal: Respiratory complications will improve Outcome: Not Progressing   Problem: Activity: Goal: Risk for activity intolerance will decrease Outcome: Not Progressing   Problem: Elimination: Goal: Will not experience complications related to bowel motility Outcome: Not Progressing   Problem: Skin Integrity: Goal: Risk for impaired skin integrity will decrease Outcome: Not Progressing

## 2022-11-12 NOTE — Discharge Summary (Addendum)
Physician Discharge Summary   Patient: Harry [name] MRN: [id number] DOB: [birth year]  Admit date:     10/30/2022  Discharge date: 11/12/22  Discharge Physician: York Ram Malori Myers   PCP: Crist Fat, MD   Recommendations at discharge:    Patient had Iron infusion during his hospitalization, follow up iron panel and Hgb in 3 weeks.  Patient has been placed on furosemide for diuresis Kcl supplementation to avoid hypokalemia Follow up renal function in 7 days. Aspiration precautions, keep head of the bed elevated 30 degrees at all time.  Follow up with Dr Leonia Reader in 7 to 10 days.   I spoke with patient's daughter over the phone, we talked in detail about patient's condition, plan of care and prognosis and all questions were addressed.   Discharge Diagnoses: Principal Problem:   Acute on chronic diastolic CHF (congestive heart failure) (HCC) Active Problems:   Aspiration into airway   Chronic anemia   Essential hypertension   Hemiplegia, late effect of cerebrovascular disease (HCC)   Lewy body dementia (HCC)   Type 2 diabetes mellitus with hyperlipidemia (HCC)   Stage 3b chronic kidney disease (HCC)  Resolved Problems:   * No resolved hospital problems. [facility] Course: Harry [name] was admitted to the hospital with the working diagnosis of recurrent aspiration complicated with respiratory failure.   77 y.o. male with a known history of CVA, CKD, hypertension, hyperlipidemia, Lewy body dementia peripheral vascular disease, CVA and type 2 diabetes presents to the emergency department for evaluation of respiratory distress.  Patient was discharged from the hospital on 10/30/22 to [facility] after admission for anemia requiring transfusion, aspiration pneumonia and bilateral LE pressure ulcers.  He was at the facility for one hour and was found to be hypoxic into the 70s and in respiratory distress.  CPAP was started as well as a nebulizer.  Of note patient has indwelling  foley and GTube.  On his initial physical examination his blood pressure was 129/68, HR 80, RR 27 and 02 saturation 86% on room air, lungs with rales, rhonchi and increased work of breathing, heart with S1 and S2 present and regular, abdomen with no distention, PEG tube in place, lower extremities in protection boots. Patient was alert and orientated x1 only.   Na 141, K 3,6 Cl 107 bicarbonate 23, glucose 230, bun 29 cr 1,16  BNP 511 Wbc 6,8 hgb 7.1 plt 266  TSH 4,750 Sars covid negative  Urine analysis SG 1,013, protein 30, negative leukocytes and negative hgb.   CT head with no acute intracranial abnormalities.  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   Chest radiograph with bilateral interstitial infiltrates right upper lobe and left lower lobe.   EKG 84 bpm, normal axis, normal intervals, sinus rhythm with poor R R wave progression, q wave V1 and V2 with no significant ST segment or  T wave changes.   Patient was placed on IV antibiotic therapy.   8/29 patient was noted to be significantly hypoxic respiratory failure. Chest x-ray showed whiteout of the left lung. Patient likely experiencing mucous plugging because of aspiration. Pulmonology was consulted. They have had multiple discussions with patient's daughter. At this time supportive care has been provided. No plans for procedures. Patient's CODE STATUS was changed over to DNR/DNI by his daughter.   09/04 Gastric tube was exchanged per IR 09/05 patient with 02 desaturation and requiring high flow oxygen per non re-breather.  Chest radiograph with persistent bilateral  interstitial infiltrates.  09/06 more awake and alert. Improved 02 requirements to nasal cannula.  09/07 Stable oxygenation. Plan for possible transfer back to SNF on 11/12/22.  09/08 continue to improved, today he is on room air with good toleration. Plan to transfer to SNF tomorrow.  09/09 patient is stable for transfer to SNF.  Assessment  and Plan: * Acute on chronic diastolic CHF (congestive heart failure) (HCC) Echocardiogram with preserved LV systolic function with EF 60 to 65%, mild LVH, RV systolic function preserved, RVSP 35.5,   Likely edema also from hypoproteinemia., Negative fluid balance was achieved with furosemide IV and then transition to oral formulation with good toleration.  At the time his discharge will resume carvedilol and losartan.   Aspiration into airway Aspiration pneumonia (bilateral right upper lobe and left lower lobe), present on admission.   Patient has been on antibiotic therapy, broad spectrum, vancomycin and zosyn. He has completed his course of antibiotics with Zosyn.  09/05 episode of aspiration pneumonitis with increased 02 requirements, up to non re-breather mask. With supportive medical therapy his oxygenation has improved and at the time of his discharge his 02 saturation is 100% on room air.   Plan to continue aspiration precautions, head elevated 30 degrees at all times.   Unfortunately his prognosis is poor due to recurrent aspiration episodes.  Acute hypoxemic respiratory failure, due to aspiration pneumonia, aspiration pneumonitis, left pleural effusion.  Oxygenation has improved, and 02 requirements have decreased.   Chronic anemia Patient required one unit PRBC transfusion on 08/30 No signs of active bleeding.   Iron panel consistent with iron deficiency anemia combined with anemia of chronic disease. Serum iron 16, TIBC 153, ferritin 195, transferrin 105 and transferrin saturation 11.   Sp IV iron infusion, 2 doses of ferric gluconate.   Follow up hgb is 8,0  Follow up iron panel and hgb in 3 weeks.  Essential hypertension Continue blood pressure control with carvedilol, losartan and amlodipine. Diuresis with furosemide.  Holding on hydralazine for now.     Hemiplegia, late effect of cerebrovascular disease (HCC) Patient with poor mobility and poor  communication Continue supportive medical care. Poor prognosis and high risk for medical complications.   Urinary retention, with chronic indwelling foley catheter.   Lewy body dementia (HCC) Advanced dementia.  Continue supportive medical care.   Type 2 diabetes mellitus with hyperlipidemia (HCC) Continue insulin sliding scal for glucose cover and monitoring.  His glucose has been stable, hypoglycemia has resolved.  Tolerating well tube feedings.    Stage 3b chronic kidney disease (HCC) Volume status has improved.   Renal function stable with serum cr at 0.97 with K at 3,9 and serum bicarbonate at 28. Na 141.  Plan to continue furosemide, Kcl supplementation and free water flushes per feeding tube.      Consultants: IR  Procedures performed: feeding tube was exchanged.  Disposition: Skilled nursing facility Diet recommendation:  NPO tube feedings  DISCHARGE MEDICATION: Allergies as of 11/12/2022       Reactions   Lipitor [atorvastatin] Other (See Comments)   Myalgias   Statins Other (See Comments)   Myalgias  Pt tolerating rosuvastatin 5mg  QD   Pravachol [pravastatin] Rash        Medication List     STOP taking these medications    docusate sodium 100 MG capsule Commonly known as: COLACE   ferrous gluconate 324 MG tablet Commonly known as: FERGON   hydrALAZINE 25 MG tablet Commonly known as: APRESOLINE  Lantus SoloStar 100 UNIT/ML Solostar Pen Generic drug: insulin glargine   leptospermum manuka honey Pste paste   linezolid 600 MG tablet Commonly known as: ZYVOX   metformin 500 MG (OSM) 24 hr tablet Commonly known as: FORTAMET   pantoprazole 40 MG tablet Commonly known as: Protonix   sucralfate 1 GM/10ML suspension Commonly known as: CARAFATE   tamsulosin 0.4 MG Caps capsule Commonly known as: FLOMAX       TAKE these medications    amLODipine 5 MG tablet Commonly known as: NORVASC Place 1 tablet (5 mg total) into feeding tube  daily.   aspirin EC 81 MG tablet Take 81 mg by mouth daily. Swallow whole.   carvedilol 6.25 MG tablet Commonly known as: COREG Place 1 tablet (6.25 mg total) into feeding tube 2 (two) times daily with a meal.   clopidogrel 75 MG tablet Commonly known as: PLAVIX Take 75 mg by mouth daily.   famotidine 40 MG/5ML suspension Commonly known as: PEPCID Place 2.5 mLs (20 mg total) into feeding tube daily.   feeding supplement (OSMOLITE 1.5 CAL) Liqd Place 1,000 mLs into feeding tube continuous. 50 ml/hr   feeding supplement (PROSource TF20) liquid Place 60 mLs into feeding tube daily.   free water Soln Place 150 mLs into feeding tube every 4 (four) hours.   furosemide 40 MG tablet Commonly known as: LASIX Place 1 tablet (40 mg total) into feeding tube daily.   GenTeal Tears Severe Day/Night 0.4-0.3 % Gel ophthalmic gel Generic drug: Polyethyl Glycol-Propyl Glycol Place 1 Application into both eyes 3 (three) times daily.   insulin aspart 100 UNIT/ML injection Commonly known as: novoLOG Inject 0-9 Units into the skin 3 (three) times daily with meals. What changed:  how much to take when to take this additional instructions Another medication with the same name was removed. Continue taking this medication, and follow the directions you see here.   ipratropium-albuterol 0.5-2.5 (3) MG/3ML Soln Commonly known as: DUONEB Take 3 mLs by nebulization every 4 (four) hours as needed. What changed: reasons to take this   losartan 25 MG tablet Commonly known as: COZAAR Take 25 mg by mouth See admin instructions. 12.5mg  po every day prn sBP >155 or dBP >100   multivitamin Liqd Place 15 mLs into feeding tube daily.   nystatin 100000 UNIT/ML suspension Commonly known as: MYCOSTATIN Take 15 mLs by mouth in the morning, at noon, and at bedtime. Swish and spit   potassium chloride 20 MEQ packet Commonly known as: KLOR-CON Place 20 mEq into feeding tube daily.   pyridostigmine 60  MG tablet Commonly known as: MESTINON Take 0.5 tablets (30 mg total) by mouth every 8 (eight) hours. What changed: how to take this   rosuvastatin 5 MG tablet Commonly known as: CRESTOR Place 5 mg into feeding tube at bedtime.   sertraline 25 MG tablet Commonly known as: ZOLOFT Place 25 mg into feeding tube daily.        Discharge Exam: Filed Weights   11/10/22 0441 11/11/22 0400 11/12/22 0500  Weight: 78.5 kg 78.3 kg 78.3 kg   BP (!) 150/78 (BP Location: Left Arm)   Pulse 80   Temp 98.2 F (36.8 C) (Oral)   Resp 20   Ht 5\' 9"  (1.753 m)   Wt 78.3 kg   SpO2 100%   BMI 25.49 kg/m   Patient with no chest pain or dyspnea, he has been tolerating tube feedings well.   Neurology awake and alert. Able to respond to  simple questions and follow simple commands. ENT with mild pallor. Moist mucous membranes Cardiovascular with S1 and S2 present and regular with no gallops, rubs or murmurs Respiratory with scattered rhonchi with no wheezing or rales Abdomen with no distention, peg tube in place No lower extremity edema. (Protection boots in place)   Condition at discharge: stable  The results of significant diagnostics from this hospitalization (including imaging, microbiology, ancillary and laboratory) are listed below for reference.   Imaging Studies: DG Chest 1 View  Result Date: 11/08/2022 CLINICAL DATA:  Dyspnea. EXAM: CHEST  1 VIEW COMPARISON:  11/02/2022 FINDINGS: Patient is rotated towards left on this examination. Persistent bilateral airspace disease. Aeration in the right upper lung may have slightly increased. Persistent basilar lung densities, left side greater than right. Basilar lung densities could represent pleural fluid and consolidation. Limited evaluation of the cardiac silhouette. Nodular density in the region of the anterior right fourth rib probably represents focal sclerosis based on previous chest CT from 10/21/2022. IMPRESSION: 1. Persistent bilateral  airspace disease. Aeration in the right upper lung may have slightly increased. 2. Persistent basilar lung densities, left side greater than right. Electronically Signed   By: Richarda Overlie M.D.   On: 11/08/2022 13:54   IR GASTR TUBE CONVERT GASTR-JEJ PER W/FL MOD SED  Result Date: 11/07/2022 INDICATION: 77 year old male with history of indwelling gastrostomy tube with poor tolerance of feeding, request for conversion to gastrojejunostomy tube. EXAM: CONVERT G-TUBE TO G-JTUBE MEDICATIONS: None. ANESTHESIA/SEDATION: None. CONTRAST:  50mL OMNIPAQUE IOHEXOL 300 MG/ML SOLN, 70mL OMNIPAQUE IOHEXOL 300 MG/ML SOLN - administered into the gastric lumen. FLUOROSCOPY TIME:  Fluoroscopy Time: 23 minutes 42 seconds (108.5 mGy). COMPLICATIONS: None immediate. PROCEDURE: Informed written consent was obtained from the patient after a thorough discussion of the procedural risks, benefits and alternatives. All questions were addressed. Maximal Sterile Barrier Technique was utilized including caps, mask, sterile gowns, sterile gloves, sterile drape, hand hygiene and skin antiseptic. A timeout was performed prior to the initiation of the procedure. The indwelling gastrojejunostomy tube was prepped and draped in standard fashion. Preprocedure contrast injection demonstrated appropriate position within the gastric lumen. Stiff Glidewire was inserted, the balloon was deflated, and the gastrostomy tube was removed. The using a coaxial system of a 6 French angled tip braided sheath and an angled tip 5 French catheter, the pylorus was attempted to be cannulated. This was without success. Therefore, coaxial system with the addition of a 10 [place], angled sheath was attempted, again to no avail. Next, a coaxial system with addition of a 12 French sheath was attempted, again without success. Therefore,, through the 12 French sheath, a discover spyglass cholangioscope was inserted and used to traverse the pylorus into the duodenum. Through the  cholangioscopy wire port, a stiff Glidewire was advanced to the proximal jejunum. The cholangio scope and sheath were removed over which a 26 French gastrojejunostomy tube was placed. Contrast was injected through the jejunal limb inch demonstrated adequate positioning in the proximal jejunum. The balloon was inflated with 10 mL of sterile water. Contrast was injected through the gastrostomy limb which demonstrated adequate position within the stomach. The bumper was cinched externally. Sterile bandage was placed. The patient tolerated the procedure well was transferred back to the floor in good condition. IMPRESSION: Fluoroscopic and percutaneous endoscopic assisted conversion of 24 French gastrostomy to 80 French gastrojejunostomy. Marliss Coots, MD [facility] Electronically Signed   By: Marliss Coots M.D.   On: 11/07/2022 16:37  DG CHEST PORT 1 VIEW  Result Date: 11/02/2022 CLINICAL DATA:  Airspace opacities on prior exam EXAM: PORTABLE CHEST 1 VIEW COMPARISON:  11/01/2022 FINDINGS: There is previously nearly complete consolidation of the left hemithorax, currently the left upper lobe is aerated. Bilateral lower lobe airspace opacities with obscuration of the hemidiaphragms. Hazy interstitial and potentially faint ground-glass opacity in the right upper lobe. Overall the degree of basilar opacity on the right is mildly increased, but with substantially improved aeration in the left upper lobe. Cannot exclude layering pleural effusions. Upper normal heart size. No pneumothorax. No significant bony findings. IMPRESSION: 1. Substantially improved aeration in the left upper lobe, currently the left upper lobe is aerated. 2. Mildly increased right basilar opacity, potentially from layering pleural effusion. 3. Hazy interstitial and potentially faint ground-glass opacity in the right upper lobe. 4. Upper normal heart size. 5. No pneumothorax or  pneumomediastinum. Electronically Signed   By: Gaylyn Rong M.D.   On: 11/02/2022 16:43   DG CHEST PORT 1 VIEW  Result Date: 11/01/2022 CLINICAL DATA:  Hypoxia.  Shortness of breath. EXAM: PORTABLE CHEST 1 VIEW COMPARISON:  10/30/2022. FINDINGS: Since the prior study, there is new near complete opacification of left hemithorax. Findings may represent combination of left pleural effusion with collapse and/or consolidation. Small area of relative aeration is noted overlying the left mid lung zone. Findings are concerning for mucous plugging. Bronchoscopy is recommended. Redemonstration of diffuse alveolar and interstitial opacities overlying the right hemithorax without significant interval change. There is probable associated layering right pleural effusion without significant interval change as well. No right pneumothorax. Evaluation of cardiomediastinal silhouette is nondiagnostic due to left hemithorax opacification. However, there is probable mediastinal shift to the left. No acute osseous abnormalities. The soft tissues are within normal limits. IMPRESSION: 1. New near complete opacification of the left hemithorax with probable mediastinal shift to the left. Findings are concerning for mucous plugging. Bronchoscopy is recommended. 2. Stable diffuse alveolar and interstitial opacities overlying the right hemithorax with probable associated layering right pleural effusion. Electronically Signed   By: Jules Schick M.D.   On: 11/01/2022 09:39   ECHOCARDIOGRAM COMPLETE  Result Date: 10/31/2022    ECHOCARDIOGRAM REPORT   Patient Name:   Harry [name] Date of Exam: 10/31/2022 Medical Rec #:  [id number]        Height:       69.0 in Accession #:    [id number]       Weight:       180.8 lb Date of Birth:  [birth year]         BSA:          1.979 m Patient Age:    76 years         BP:           107/55 mmHg Patient Gender: M                HR:           87 bpm. Exam Location:  Inpatient Procedure: 2D Echo,  Cardiac Doppler, Color Doppler and Strain Analysis Indications:    Congestive Heart Failure I50.9  History:        Patient has prior history of Echocardiogram examinations, most                 recent 09/11/2022. Stroke; Risk Factors:Dyslipidemia,                 Hypertension, Current Smoker and Diabetes.  Sonographer:  Eulah Pont RDCS Referring Phys: Tonye Royalty  Sonographer Comments: Global longitudinal strain was attempted. IMPRESSIONS  1. Left ventricular ejection fraction, by estimation, is 60 to 65%. The left ventricle has normal function. The left ventricle has no regional wall motion abnormalities. There is mild left ventricular hypertrophy. Left ventricular diastolic parameters are consistent with Grade II diastolic dysfunction (pseudonormalization). Elevated left atrial pressure.  2. Right ventricular systolic function is normal. The right ventricular size is mildly enlarged. There is mildly elevated pulmonary artery systolic pressure. The estimated right ventricular systolic pressure is 35.5 mmHg.  3. Moderate pleural effusion in the left lateral region.  4. The mitral valve is normal in structure. Trivial mitral valve regurgitation.  5. The aortic valve is tricuspid. Aortic valve regurgitation is not visualized. Aortic valve sclerosis/calcification is present, without any evidence of aortic stenosis.  6. The inferior vena cava is normal in size with greater than 50% respiratory variability, suggesting right atrial pressure of 3 mmHg. FINDINGS  Left Ventricle: Left ventricular ejection fraction, by estimation, is 60 to 65%. The left ventricle has normal function. The left ventricle has no regional wall motion abnormalities. The left ventricular internal cavity size was normal in size. There is  mild left ventricular hypertrophy. Left ventricular diastolic parameters are consistent with Grade II diastolic dysfunction (pseudonormalization). Elevated left atrial pressure. Right Ventricle: The  right ventricular size is mildly enlarged. No increase in right ventricular wall thickness. Right ventricular systolic function is normal. There is mildly elevated pulmonary artery systolic pressure. The tricuspid regurgitant velocity is 2.85 m/s, and with an assumed right atrial pressure of 3 mmHg, the estimated right ventricular systolic pressure is 35.5 mmHg. Left Atrium: Left atrial size was normal in size. Right Atrium: Right atrial size was normal in size. Pericardium: Trivial pericardial effusion is present. Mitral Valve: The mitral valve is normal in structure. Trivial mitral valve regurgitation. Tricuspid Valve: The tricuspid valve is normal in structure. Tricuspid valve regurgitation is mild. Aortic Valve: The aortic valve is tricuspid. Aortic valve regurgitation is not visualized. Aortic valve sclerosis/calcification is present, without any evidence of aortic stenosis. Pulmonic Valve: The pulmonic valve was not well visualized. Pulmonic valve regurgitation is not visualized. Aorta: The aortic root and ascending aorta are structurally normal, with no evidence of dilitation. Venous: The inferior vena cava is normal in size with greater than 50% respiratory variability, suggesting right atrial pressure of 3 mmHg. IAS/Shunts: The interatrial septum was not well visualized. Additional Comments: There is a moderate pleural effusion in the left lateral region.  LEFT VENTRICLE PLAX 2D LVIDd:         4.30 cm      Diastology LVIDs:         2.90 cm      LV e' medial:    5.55 cm/s LV PW:         1.30 cm      LV E/e' medial:  16.7 LV IVS:        1.30 cm      LV e' lateral:   8.10 cm/s LVOT diam:     2.10 cm      LV E/e' lateral: 11.5 LV SV:         86 LV SV Index:   44 LVOT Area:     3.46 cm  LV Volumes (MOD) LV vol d, MOD A2C: 120.0 ml LV vol d, MOD A4C: 86.6 ml LV vol s, MOD A2C: 46.0 ml LV vol s, MOD A4C: 38.2 ml LV  SV MOD A2C:     74.0 ml LV SV MOD A4C:     86.6 ml LV SV MOD BP:      61.0 ml RIGHT VENTRICLE RV S  prime:     11.00 cm/s TAPSE (M-mode): 1.9 cm LEFT ATRIUM             Index        RIGHT ATRIUM           Index LA diam:        3.50 cm 1.77 cm/m   RA Area:     16.90 cm LA Vol (A2C):   47.6 ml 24.05 ml/m  RA Volume:   43.80 ml  22.13 ml/m LA Vol (A4C):   50.6 ml 25.57 ml/m LA Biplane Vol: 49.7 ml 25.11 ml/m  AORTIC VALVE LVOT Vmax:   119.00 cm/s LVOT Vmean:  78.100 cm/s LVOT VTI:    0.249 m  AORTA Ao Root diam: 3.80 cm Ao Asc diam:  3.50 cm MITRAL VALVE               TRICUSPID VALVE MV Area (PHT): 4.49 cm    TR Peak grad:   32.5 mmHg MV Decel Time: 169 msec    TR Vmax:        285.00 cm/s MV E velocity: 92.90 cm/s MV A velocity: 82.70 cm/s  SHUNTS MV E/A ratio:  1.12        Systemic VTI:  0.25 m                            Systemic Diam: 2.10 cm Epifanio Lesches MD Electronically signed by Epifanio Lesches MD Signature Date/Time: 10/31/2022/12:36:48 PM    Final    DG Chest Port 1 View  Result Date: 10/30/2022 CLINICAL DATA:  Shortness of breath. EXAM: PORTABLE CHEST 1 VIEW COMPARISON:  October 27, 2022 FINDINGS: The heart size and mediastinal contours are within normal limits. Stable patchy moderate severity bilateral perihilar airspace opacities are seen, left greater than right. There is a small, partially layering right pleural effusion. A moderate size left pleural effusion is also noted. No pneumothorax is identified. The visualized skeletal structures are unremarkable. IMPRESSION: 1. Stable moderate severity bilateral perihilar airspace disease, left greater than right. 2. Bilateral pleural effusions, left greater than right. Electronically Signed   By: Aram Candela M.D.   On: 10/30/2022 19:39   CT Head Wo Contrast  Result Date: 10/30/2022 CLINICAL DATA:  Altered mental status EXAM: CT HEAD WITHOUT CONTRAST TECHNIQUE: Contiguous axial images were obtained from the base of the skull through the vertex without intravenous contrast. RADIATION DOSE REDUCTION: This exam was performed  according to the departmental dose-optimization program which includes automated exposure control, adjustment of the mA and/or kV according to patient size and/or use of iterative reconstruction technique. COMPARISON:  10/05/2022 FINDINGS: Brain: There is no acute hemorrhage, mass or extra-axial collection. There is periventricular hypoattenuation compatible with chronic microvascular disease. There are old infarcts of the left occipital lobe and bilateral cerebellum with associated encephalomalacia. Mild generalized volume loss. Vascular: Atherosclerotic calcification of the internal carotid arteries at the skull base. No abnormal hyperdensity of the major intracranial arteries or dural venous sinuses. Skull: The visualized skull base, calvarium and extracranial soft tissues are normal. Sinuses/Orbits: No fluid levels or advanced mucosal thickening of the visualized paranasal sinuses. No mastoid or middle ear effusion. The orbits are normal. IMPRESSION: 1. No acute intracranial abnormality. 2.  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 Electronically Signed   By: Deatra Robinson M.D.   On: 10/30/2022 19:24   DG Chest Port 1 View  Result Date: 10/27/2022 CLINICAL DATA:  Shortness of breath, recent aspiration pneumonia EXAM: PORTABLE CHEST 1 VIEW COMPARISON:  10/22/2022 FINDINGS: The patient is rotated to the right on today's radiograph, reducing diagnostic sensitivity and specificity. Suspected left pleural effusion and possibly small right pleural effusion. Bilateral perihilar airspace opacities, left greater than right. Pneumonia is a distinct possibility. Obscuration left hemidiaphragm compatible with atelectasis or pneumonia. Heart size within normal limits. IMPRESSION: 1. Bilateral perihilar airspace opacities, left greater than right, suspicious for pneumonia. 2. Suspected left pleural effusion and possibly small right pleural effusion. 3. Obscuration left hemidiaphragm compatible with  atelectasis or pneumonia. Electronically Signed   By: Gaylyn Rong M.D.   On: 10/27/2022 20:36   DG CHEST PORT 1 VIEW  Result Date: 10/22/2022 CLINICAL DATA:  Shortness of breath EXAM: PORTABLE CHEST 1 VIEW COMPARISON:  10/20/2022 FINDINGS: Consolidation throughout the left lung and in the right lower lobe concerning for pneumonia. Heart and mediastinal contours are within normal limits. Small bilateral pleural effusions. No acute bony abnormality. IMPRESSION: Worsening bilateral airspace disease, diffuse on the left and in the right lower lobe concerning for multifocal pneumonia. Small layering bilateral effusions. Electronically Signed   By: Charlett Nose M.D.   On: 10/22/2022 22:17   CT Angio Chest Pulmonary Embolism (PE) W or WO Contrast  Result Date: 10/21/2022 CLINICAL DATA:  Pulmonary embolism (PE), high prob; Abdominal pain, acute, nonlocalized EXAM: CT ANGIOGRAPHY CHEST CT ABDOMEN AND PELVIS WITH CONTRAST TECHNIQUE: Multidetector CT imaging of the chest was performed using the standard protocol during bolus administration of intravenous contrast. Multiplanar CT image reconstructions and MIPs were obtained to evaluate the vascular anatomy. Multidetector CT imaging of the abdomen and pelvis was performed using the standard protocol during bolus administration of intravenous contrast. RADIATION DOSE REDUCTION: This exam was performed according to the departmental dose-optimization program which includes automated exposure control, adjustment of the mA and/or kV according to patient size and/or use of iterative reconstruction technique. CONTRAST:  OMNIPAQUE IOHEXOL 350 MG/ML SOLN COMPARISON:  CT chest 09/06/2022, CT abdomen pelvis 10/08/2022. FINDINGS: CTA CHEST FINDINGS Cardiovascular: There is adequate opacification of the pulmonary arterial tree. No intraluminal filling defect identified to suggest acute pulmonary embolism. Central pulmonary arteries are of normal caliber. Moderate  coronary artery calcification. Global cardiac size within normal limits. No pericardial effusion. Mild atherosclerotic calcification within the thoracic aorta. No aortic aneurysm. Mediastinum/Nodes: Visualized thyroid is unremarkable. Stable shotty right paratracheal and bilateral hilar adenopathy, nonspecific, possibly reactive in nature. The esophagus is unremarkable. Lungs/Pleura: There is extensive and luminal debris within the tracheobronchial tree with near complete impaction of the left bronchial tree and scattered airway impaction within the right lower lobe. Bibasilar pulmonary infiltrates are present. Together, the findings suggest changes of aspiration. Small bilateral pleural effusions are present, new since prior examination. No pneumothorax. Musculoskeletal: No acute bone abnormality. No lytic or blastic bone lesion. Review of the MIP images confirms the above findings. CT ABDOMEN and PELVIS FINDINGS Hepatobiliary: Cholelithiasis without pericholecystic inflammatory change. Tiny cyst within the right hepatic dome. Liver otherwise unremarkable. No intra or extrahepatic biliary ductal dilation. Pancreas: Unremarkable. No pancreatic ductal dilatation or surrounding inflammatory changes. Spleen: Normal in size without focal abnormality. Adrenals/Urinary Tract: The adrenal glands are unremarkable. The kidneys are normal in size and position. Simple cortical cyst noted  within the lower pole right kidney for which no follow-up imaging is recommended. The kidneys are otherwise unremarkable. Foley catheter balloon is seen within a largely decompressed bladder lumen. There is circumferential marked bladder wall thickening suggesting changes of chronic bladder outlet obstruction, similar to prior examination. Stomach/Bowel: Previously noted button type PEG tube has been exchanged for a balloon retention gastrostomy catheter which is in expected position within the gastric lumen. Infiltration along the tract of  the gastrostomy catheter appears improved since prior examination related to leakage or inflammation along the tract. Moderate fluid is again seen within the subcutaneous soft tissues of the left lateral abdominal wall as well as within the fascial planes of the lateral abdominal wall musculature, increased since prior examination. Stomach, small bowel, and large bowel are otherwise unremarkable. Appendix normal. No free intraperitoneal gas or fluid. Vascular/Lymphatic: Extensive atherosclerotic calcification within the aortoiliac vasculature. No aortic aneurysm. No pathologic adenopathy within the abdomen and pelvis. Reproductive: Marked prostatic hypertrophy. Other: Mild diffuse subcutaneous edema again noted within the gluteal soft tissues and visualized lower extremities bilaterally. Musculoskeletal: No acute bone abnormality. No lytic or blastic bone lesion. Review of the MIP images confirms the above findings. IMPRESSION: 1. No pulmonary embolism. 2. Extensive endoluminal debris within the tracheobronchial tree with near complete impaction of the left bronchial tree and scattered airway impaction within the right lower lobe. Bibasilar pulmonary infiltrates. Together, the findings suggest changes of aspiration. 3. Small bilateral pleural effusions, new since prior examination but improved since CT examination of the abdomen pelvis of 10/08/2022. 4. Moderate coronary artery calcification. 5. Cholelithiasis. 6. Marked prostatic hypertrophy. Changes of chronic bladder outlet obstruction with Foley decompression of the bladder. 7. Interval gastrostomy exchange. Mild subcutaneous infiltration along the gastrostomy catheter tract and moderate fluid within the subcutaneous soft tissues of the left lateral abdominal wall as well as within the fascial planes of the lateral abdominal wall musculature may reflect changes of leakage along an immature tract and appears increased since prior examination. Aortic  Atherosclerosis (ICD10-I70.0). Electronically Signed   By: Helyn Numbers M.D.   On: 10/21/2022 02:56   CT ABDOMEN PELVIS W CONTRAST  Result Date: 10/21/2022 CLINICAL DATA:  Pulmonary embolism (PE), high prob; Abdominal pain, acute, nonlocalized EXAM: CT ANGIOGRAPHY CHEST CT ABDOMEN AND PELVIS WITH CONTRAST TECHNIQUE: Multidetector CT imaging of the chest was performed using the standard protocol during bolus administration of intravenous contrast. Multiplanar CT image reconstructions and MIPs were obtained to evaluate the vascular anatomy. Multidetector CT imaging of the abdomen and pelvis was performed using the standard protocol during bolus administration of intravenous contrast. RADIATION DOSE REDUCTION: This exam was performed according to the departmental dose-optimization program which includes automated exposure control, adjustment of the mA and/or kV according to patient size and/or use of iterative reconstruction technique. CONTRAST:  OMNIPAQUE IOHEXOL 350 MG/ML SOLN COMPARISON:  CT chest 09/06/2022, CT abdomen pelvis 10/08/2022. FINDINGS: CTA CHEST FINDINGS Cardiovascular: There is adequate opacification of the pulmonary arterial tree. No intraluminal filling defect identified to suggest acute pulmonary embolism. Central pulmonary arteries are of normal caliber. Moderate coronary artery calcification. Global cardiac size within normal limits. No pericardial effusion. Mild atherosclerotic calcification within the thoracic aorta. No aortic aneurysm. Mediastinum/Nodes: Visualized thyroid is unremarkable. Stable shotty right paratracheal and bilateral hilar adenopathy, nonspecific, possibly reactive in nature. The esophagus is unremarkable. Lungs/Pleura: There is extensive and luminal debris within the tracheobronchial tree with near complete impaction of the left bronchial tree and scattered airway impaction within the right lower  lobe. Bibasilar pulmonary infiltrates are present. Together, the  findings suggest changes of aspiration. Small bilateral pleural effusions are present, new since prior examination. No pneumothorax. Musculoskeletal: No acute bone abnormality. No lytic or blastic bone lesion. Review of the MIP images confirms the above findings. CT ABDOMEN and PELVIS FINDINGS Hepatobiliary: Cholelithiasis without pericholecystic inflammatory change. Tiny cyst within the right hepatic dome. Liver otherwise unremarkable. No intra or extrahepatic biliary ductal dilation. Pancreas: Unremarkable. No pancreatic ductal dilatation or surrounding inflammatory changes. Spleen: Normal in size without focal abnormality. Adrenals/Urinary Tract: The adrenal glands are unremarkable. The kidneys are normal in size and position. Simple cortical cyst noted within the lower pole right kidney for which no follow-up imaging is recommended. The kidneys are otherwise unremarkable. Foley catheter balloon is seen within a largely decompressed bladder lumen. There is circumferential marked bladder wall thickening suggesting changes of chronic bladder outlet obstruction, similar to prior examination. Stomach/Bowel: Previously noted button type PEG tube has been exchanged for a balloon retention gastrostomy catheter which is in expected position within the gastric lumen. Infiltration along the tract of the gastrostomy catheter appears improved since prior examination related to leakage or inflammation along the tract. Moderate fluid is again seen within the subcutaneous soft tissues of the left lateral abdominal wall as well as within the fascial planes of the lateral abdominal wall musculature, increased since prior examination. Stomach, small bowel, and large bowel are otherwise unremarkable. Appendix normal. No free intraperitoneal gas or fluid. Vascular/Lymphatic: Extensive atherosclerotic calcification within the aortoiliac vasculature. No aortic aneurysm. No pathologic adenopathy within the abdomen and pelvis.  Reproductive: Marked prostatic hypertrophy. Other: Mild diffuse subcutaneous edema again noted within the gluteal soft tissues and visualized lower extremities bilaterally. Musculoskeletal: No acute bone abnormality. No lytic or blastic bone lesion. Review of the MIP images confirms the above findings. IMPRESSION: 1. No pulmonary embolism. 2. Extensive endoluminal debris within the tracheobronchial tree with near complete impaction of the left bronchial tree and scattered airway impaction within the right lower lobe. Bibasilar pulmonary infiltrates. Together, the findings suggest changes of aspiration. 3. Small bilateral pleural effusions, new since prior examination but improved since CT examination of the abdomen pelvis of 10/08/2022. 4. Moderate coronary artery calcification. 5. Cholelithiasis. 6. Marked prostatic hypertrophy. Changes of chronic bladder outlet obstruction with Foley decompression of the bladder. 7. Interval gastrostomy exchange. Mild subcutaneous infiltration along the gastrostomy catheter tract and moderate fluid within the subcutaneous soft tissues of the left lateral abdominal wall as well as within the fascial planes of the lateral abdominal wall musculature may reflect changes of leakage along an immature tract and appears increased since prior examination. Aortic Atherosclerosis (ICD10-I70.0). Electronically Signed   By: Helyn Numbers M.D.   On: 10/21/2022 02:56   DG Chest 2 View  Result Date: 10/20/2022 CLINICAL DATA:  Cough.  Recently diagnosed with pneumonia. EXAM: CHEST - 2 VIEW COMPARISON:  10/02/2022, CT abdomen 10/08/2022 FINDINGS: Lungs are adequately inflated demonstrate hazy left base/retrocardiac opacification likely due to small effusion with possible associated atelectasis. Hazy opacification over the right infrahilar region which may be due to asymmetric edema versus infection. Overall interval improved bibasilar opacification likely improved effusions. Cardiomediastinal  silhouette and remainder of the exam is unchanged. IMPRESSION: 1. Improved hazy left base/retrocardiac opacification likely due to small effusion with possible associated atelectasis. 2. Improved hazy opacification over the right infrahilar region which may be due to asymmetric edema, atelectasis or infection. Electronically Signed   By: Elberta Fortis M.D.   On: 10/20/2022  11:41   EEG adult  Result Date: 10/15/2022 Charlsie Quest, MD     10/15/2022  4:45 PM Patient Name: Harry [name] MRN: [id number] Epilepsy Attending: Charlsie Quest Referring Physician/Provider: Briant Cedar, MD Date: 10/15/2022 Duration: 24.50 mins Patient history: 78yo M with ams getting eeg to evaluate for seizure Level of alertness: Awake AEDs during EEG study: None Technical aspects: This EEG study was done with scalp electrodes positioned according to the 10-20 International system of electrode placement. Electrical activity was reviewed with band pass filter of 1-70Hz , sensitivity of 7 uV/mm, display speed of 23mm/sec with a 60Hz  notched filter applied as appropriate. EEG data were recorded continuously and digitally stored.  Video monitoring was available and reviewed as appropriate. Description:  The posterior dominant rhythm consists of 8 Hz activity of moderate voltage (25-35 uV) seen predominantly in posterior head regions, symmetric and reactive to eye opening and eye closing. Intermittent generalized 3-5Hz  theta-delta slowing was noted. Physiologic photic driving was not seen during photic stimulation.  Hyperventilation was not performed.    ABNORMALITY - Intermittent slow, generalized  IMPRESSION: This study is suggestive of mild diffuse encephalopathy, nonspecific etiology. No seizures or epileptiform discharges were seen throughout the recording.  Charlsie Quest    Microbiology: Results for orders placed or performed during the hospital encounter of 10/30/22  Resp panel by RT-PCR (RSV, Flu A&B, Covid)  Anterior Nasal Swab     Status: None   Collection Time: 10/30/22  5:47 PM   Specimen: Anterior Nasal Swab  Result Value Ref Range Status   SARS Coronavirus 2 by RT PCR NEGATIVE NEGATIVE Final   Influenza A by PCR NEGATIVE NEGATIVE Final   Influenza B by PCR NEGATIVE NEGATIVE Final    Comment: (NOTE) The Xpert Xpress SARS-CoV-2/FLU/RSV plus assay is intended as an aid in the diagnosis of influenza from Nasopharyngeal swab specimens and should not be used as a sole basis for treatment. Nasal washings and aspirates are unacceptable for Xpert Xpress SARS-CoV-2/FLU/RSV testing.  Fact Sheet for Patients: BloggerCourse.com  Fact Sheet for Healthcare Providers: SeriousBroker.it  This test is not yet approved or cleared by the [place] FDA and has been authorized for detection and/or diagnosis of SARS-CoV-2 by FDA under an Emergency Use Authorization (EUA). This EUA will remain in effect (meaning this test can be used) for the duration of the COVID-19 declaration under Section 564(b)(1) of the Act, 21 U.S.C. section 360bbb-3(b)(1), unless the authorization is terminated or revoked.     Resp Syncytial Virus by PCR NEGATIVE NEGATIVE Final    Comment: (NOTE) Fact Sheet for Patients: BloggerCourse.com  Fact Sheet for Healthcare Providers: SeriousBroker.it  This test is not yet approved or cleared by the [place] FDA and has been authorized for detection and/or diagnosis of SARS-CoV-2 by FDA under an Emergency Use Authorization (EUA). This EUA will remain in effect (meaning this test can be used) for the duration of the COVID-19 declaration under Section 564(b)(1) of the Act, 21 U.S.C. section 360bbb-3(b)(1), unless the authorization is terminated or revoked.  Performed at [facility], [address] [place], [place] [address]   MRSA Next Gen by PCR, Nasal     Status:  None   Collection Time: 11/01/22 11:17 AM   Specimen: Nasal Mucosa; Nasal Swab  Result Value Ref Range Status   MRSA by PCR Next Gen NOT DETECTED NOT DETECTED Final    Comment: (NOTE) The GeneXpert MRSA Assay (FDA approved for NASAL specimens only), is  one component of a comprehensive MRSA colonization surveillance program. It is not intended to diagnose MRSA infection nor to guide or monitor treatment for MRSA infections. Test performance is not FDA approved in patients less than 37 years old. Performed at [facility], [address], [place], [place] [address]     Labs: CBC: Recent Labs  Lab 11/06/22 0429 11/07/22 0327 11/08/22 0309 11/10/22 0222 11/12/22 0252  WBC 6.8 5.6 5.2  --   --   HGB 8.1* 8.0* 7.7* 7.5* 8.0*  HCT 25.9* 25.4* 24.7* 24.4* 26.0*  MCV 86.3 85.8 84.9  --   --   PLT 386 371 358  --   --    Basic Metabolic Panel: Recent Labs  Lab 11/06/22 0429 11/07/22 0327 11/08/22 0309 11/09/22 0339 11/11/22 0232  NA 138 136 138 139 141  K 3.9 3.9 3.9 3.7 3.9  CL 105 102 105 104 102  CO2 28 28 27 29 28   GLUCOSE 128* 150* 98 181* 184*  BUN 15 16 17 15 20   CREATININE 1.41* 1.22 1.23 1.04 0.97  CALCIUM 7.8* 7.8* 7.8* 7.7* 7.9*   Liver Function Tests: No results for input(s): "AST", "ALT", "ALKPHOS", "BILITOT", "PROT", "ALBUMIN" in the last 168 hours. CBG: Recent Labs  Lab 11/11/22 1537 11/11/22 2009 11/12/22 0005 11/12/22 0406 11/12/22 0834  GLUCAP 121* 213* 256* 162* 155*    Discharge time spent: greater than 30 minutes.  Signed: Coralie Keens, MD [facility] 11/12/2022

## 2022-11-12 NOTE — TOC Transition Note (Signed)
Transition of Care [facility]) - CM/SW Discharge Note   Patient Details  Name: Harry [name] MRN: [id number] Date of Birth: April 02, 1945  [facility] of [facility]) CM/SW Contact:  Leander Rams, LCSW Phone Number: 11/12/2022, 11:47 AM   Clinical Narrative:    Patient will DC to: [facility] Anticipated DC date: 11/12/2022 Family notified: [name]  Transport by: Sharin Mons   Per MD patient ready for DC to [facility]. RN, patient, patient's family, and facility notified of DC. Discharge Summary and FL2 sent to facility. RN to call report prior to discharge [phone]. DC packet on chart. Ambulance transport requested for patient.   CSW will sign off for now as social work intervention is no longer needed. Please consult [place] again if new needs arise.    Final next level of care: Skilled Nursing Facility Barriers to Discharge: No Barriers Identified   Patient Goals and CMS Choice   Choice offered to / list presented to : Adult Children (daughter [name])  Discharge Placement                Patient [facility] bed at: [facility] Patient to be transferred to facility by: [facility] Name of family member notified: [name] Patient and family notified of of transfer: 11/12/22  Discharge Plan and Services Additional resources added to the After Visit Summary for   In-house Referral: Clinical Social Work                                   Social Determinants of Health (SDOH) Interventions SDOH Screenings   Food Insecurity: No Food Insecurity (10/20/2022)  Housing: Patient Unable To Answer (10/20/2022)  Transportation Needs: No Transportation Needs (10/20/2022)  Utilities: Not At Risk (10/20/2022)  Depression (PHQ2-9): Low Risk  (03/01/2020)  Financial Resource Strain: Low Risk  (09/29/2018)  Physical Activity: Inactive (09/29/2018)  Tobacco Use: Medium Risk (10/30/2022)     Readmission Risk Interventions    10/22/2022   11:00 AM 10/21/2022    9:19 AM 10/08/2022    1:37 PM  Readmission  Risk Prevention Plan  Transportation Screening Complete Complete Complete  PCP or Specialist Appt within 3-5 Days  Complete   HRI or Home Care Consult  Complete   Social Work Consult for Recovery Care Planning/Counseling  Complete   Palliative Care Screening  Not Complete   Medication Review Oceanographer) Complete Not Complete Complete  PCP or Specialist appointment within 3-5 days of discharge Complete  Complete  HRI or Home Care Consult Complete    SW Recovery Care/Counseling Consult Complete    Palliative Care Screening Complete    Skilled Nursing Facility Not Applicable      Oletta Lamas, MSW, [place], [place] Transitions of Care  Clinical Social Worker I

## 2022-11-13 ENCOUNTER — Telehealth: Payer: Self-pay | Admitting: Diagnostic Neuroimaging

## 2022-11-13 ENCOUNTER — Inpatient Hospital Stay: Payer: Medicare Other | Admitting: Diagnostic Neuroimaging

## 2022-11-13 NOTE — Telephone Encounter (Signed)
[facility]) cancelling appt due to unable to transport.

## 2023-01-04 DEATH — deceased
